# Patient Record
Sex: Female | Born: 1935 | Race: White | Hispanic: No | Smoking: Former smoker
Health system: Southern US, Community
[De-identification: ages and names within clinical notes are randomized; demographics above are authoritative.]

## PROBLEM LIST (undated history)

## (undated) DIAGNOSIS — E119 Type 2 diabetes mellitus without complications: Secondary | ICD-10-CM

## (undated) DIAGNOSIS — H269 Unspecified cataract: Secondary | ICD-10-CM

## (undated) DIAGNOSIS — I639 Cerebral infarction, unspecified: Secondary | ICD-10-CM

## (undated) DIAGNOSIS — D649 Anemia, unspecified: Secondary | ICD-10-CM

## (undated) DIAGNOSIS — R609 Edema, unspecified: Secondary | ICD-10-CM

## (undated) DIAGNOSIS — E039 Hypothyroidism, unspecified: Secondary | ICD-10-CM

## (undated) DIAGNOSIS — M869 Osteomyelitis, unspecified: Secondary | ICD-10-CM

## (undated) DIAGNOSIS — N189 Chronic kidney disease, unspecified: Secondary | ICD-10-CM

## (undated) DIAGNOSIS — I10 Essential (primary) hypertension: Secondary | ICD-10-CM

## (undated) DIAGNOSIS — F0390 Unspecified dementia without behavioral disturbance: Secondary | ICD-10-CM

## (undated) DIAGNOSIS — I509 Heart failure, unspecified: Secondary | ICD-10-CM

## (undated) DIAGNOSIS — I4891 Unspecified atrial fibrillation: Secondary | ICD-10-CM

## (undated) DIAGNOSIS — S72009A Fracture of unspecified part of neck of unspecified femur, initial encounter for closed fracture: Secondary | ICD-10-CM

## (undated) DIAGNOSIS — I469 Cardiac arrest, cause unspecified: Secondary | ICD-10-CM

## (undated) DIAGNOSIS — R627 Adult failure to thrive: Secondary | ICD-10-CM

## (undated) DIAGNOSIS — I739 Peripheral vascular disease, unspecified: Secondary | ICD-10-CM

## (undated) DIAGNOSIS — E785 Hyperlipidemia, unspecified: Secondary | ICD-10-CM

## (undated) DIAGNOSIS — G629 Polyneuropathy, unspecified: Secondary | ICD-10-CM

## (undated) HISTORY — DX: Type 2 diabetes mellitus without complications: E11.9

## (undated) HISTORY — DX: Unspecified cataract: H26.9

## (undated) HISTORY — PX: HEMIARTHROPLASTY HIP: SUR652

## (undated) HISTORY — PX: APPENDECTOMY: SHX54

## (undated) HISTORY — DX: Unspecified atrial fibrillation: I48.91

## (undated) HISTORY — PX: CHOLECYSTECTOMY: SHX55

## (undated) HISTORY — PX: CORONARY ANGIOPLASTY: SHX604

## (undated) HISTORY — DX: Hyperlipidemia, unspecified: E78.5

## (undated) HISTORY — DX: Hypothyroidism, unspecified: E03.9

## (undated) HISTORY — DX: Heart failure, unspecified: I50.9

---

## 2003-03-16 ENCOUNTER — Other Ambulatory Visit: Payer: Self-pay

## 2004-06-13 ENCOUNTER — Ambulatory Visit: Payer: Self-pay | Admitting: Internal Medicine

## 2004-10-14 ENCOUNTER — Inpatient Hospital Stay: Payer: Self-pay | Admitting: Internal Medicine

## 2004-10-14 ENCOUNTER — Other Ambulatory Visit: Payer: Self-pay

## 2005-06-26 ENCOUNTER — Ambulatory Visit: Payer: Self-pay | Admitting: Internal Medicine

## 2005-09-30 ENCOUNTER — Ambulatory Visit: Payer: Self-pay | Admitting: Gastroenterology

## 2006-01-07 ENCOUNTER — Ambulatory Visit: Payer: Self-pay | Admitting: Internal Medicine

## 2006-06-30 ENCOUNTER — Ambulatory Visit: Payer: Self-pay | Admitting: Internal Medicine

## 2007-07-20 ENCOUNTER — Ambulatory Visit: Payer: Self-pay | Admitting: Internal Medicine

## 2008-09-26 ENCOUNTER — Ambulatory Visit: Payer: Self-pay | Admitting: Internal Medicine

## 2009-10-30 ENCOUNTER — Ambulatory Visit: Payer: Self-pay | Admitting: Internal Medicine

## 2010-11-05 ENCOUNTER — Ambulatory Visit: Payer: Self-pay | Admitting: Internal Medicine

## 2010-11-19 ENCOUNTER — Ambulatory Visit: Payer: Self-pay | Admitting: Internal Medicine

## 2010-11-20 ENCOUNTER — Ambulatory Visit: Payer: Self-pay | Admitting: Internal Medicine

## 2011-06-13 ENCOUNTER — Ambulatory Visit: Payer: Self-pay | Admitting: Unknown Physician Specialty

## 2011-11-26 ENCOUNTER — Ambulatory Visit: Payer: Self-pay | Admitting: Internal Medicine

## 2012-02-14 ENCOUNTER — Inpatient Hospital Stay: Payer: Self-pay | Admitting: Orthopedic Surgery

## 2012-02-14 ENCOUNTER — Ambulatory Visit: Payer: Self-pay | Admitting: Orthopedic Surgery

## 2012-02-14 LAB — T4, FREE: Free Thyroxine: 0.55 ng/dL — ABNORMAL LOW (ref 0.76–1.46)

## 2012-02-14 LAB — BASIC METABOLIC PANEL
Anion Gap: 8 (ref 7–16)
Anion Gap: 10 (ref 7–16)
BUN: 19 mg/dL — ABNORMAL HIGH (ref 7–18)
Calcium, Total: 8.2 mg/dL — ABNORMAL LOW (ref 8.5–10.1)
Chloride: 96 mmol/L — ABNORMAL LOW (ref 98–107)
Chloride: 99 mmol/L (ref 98–107)
Co2: 22 mmol/L (ref 21–32)
Creatinine: 1.36 mg/dL — ABNORMAL HIGH (ref 0.60–1.30)
Creatinine: 1.05 mg/dL (ref 0.60–1.30)
EGFR (African American): 44 — ABNORMAL LOW
EGFR (African American): 60 — ABNORMAL LOW
EGFR (Non-African Amer.): 52 — ABNORMAL LOW
Glucose: 157 mg/dL — ABNORMAL HIGH (ref 65–99)
Glucose: 126 mg/dL — ABNORMAL HIGH (ref 65–99)
Osmolality: 265 (ref 275–301)
Potassium: 3.7 mmol/L (ref 3.5–5.1)
Sodium: 128 mmol/L — ABNORMAL LOW (ref 136–145)
Sodium: 131 mmol/L — ABNORMAL LOW (ref 136–145)

## 2012-02-14 LAB — CBC
MCH: 28.2 pg (ref 26.0–34.0)
MCHC: 33.1 g/dL (ref 32.0–36.0)
MCV: 85 fL (ref 80–100)
RBC: 4.39 10*6/uL (ref 3.80–5.20)
WBC: 14.3 10*3/uL — ABNORMAL HIGH (ref 3.6–11.0)

## 2012-02-14 LAB — URINALYSIS, COMPLETE
Bacteria: NONE SEEN
Bilirubin,UR: NEGATIVE
Glucose,UR: NEGATIVE mg/dL (ref 0–75)
Nitrite: NEGATIVE
Ph: 5 (ref 4.5–8.0)
Squamous Epithelial: 1

## 2012-02-14 LAB — PROTIME-INR: INR: 0.9

## 2012-02-14 LAB — HEMOGLOBIN A1C: Hemoglobin A1C: 6.6 % — ABNORMAL HIGH (ref 4.2–6.3)

## 2012-02-16 LAB — URINALYSIS, COMPLETE
Bacteria: NONE SEEN
Bilirubin,UR: NEGATIVE
Glucose,UR: NEGATIVE mg/dL
Ketone: NEGATIVE
Leukocyte Esterase: NEGATIVE
Nitrite: NEGATIVE
Ph: 5
Protein: NEGATIVE
RBC,UR: 4 /HPF
Specific Gravity: 1.008
Squamous Epithelial: <1
WBC UR: 1 /HPF

## 2012-02-16 LAB — BASIC METABOLIC PANEL
Anion Gap: 9 (ref 7–16)
BUN: 9 mg/dL (ref 7–18)
Chloride: 104 mmol/L (ref 98–107)
Co2: 22 mmol/L (ref 21–32)
EGFR (African American): 43 — ABNORMAL LOW
EGFR (Non-African Amer.): 37 — ABNORMAL LOW
Glucose: 135 mg/dL — ABNORMAL HIGH (ref 65–99)
Potassium: 3.7 mmol/L (ref 3.5–5.1)

## 2012-02-16 LAB — CBC WITH DIFFERENTIAL/PLATELET
Basophil #: 0.1 10*3/uL (ref 0.0–0.1)
Basophil %: 0.6 %
Eosinophil %: 2.7 %
HCT: 26.7 % — ABNORMAL LOW (ref 35.0–47.0)
Lymphocyte #: 1.4 10*3/uL (ref 1.0–3.6)
Lymphocyte %: 14.5 %
MCHC: 33.6 g/dL (ref 32.0–36.0)
MCV: 86 fL (ref 80–100)
Monocyte #: 1 x10 3/mm — ABNORMAL HIGH (ref 0.2–0.9)
Monocyte %: 9.9 %
Neutrophil #: 7.2 10*3/uL — ABNORMAL HIGH (ref 1.4–6.5)
Platelet: 158 10*3/uL (ref 150–440)
RBC: 3.12 10*6/uL — ABNORMAL LOW (ref 3.80–5.20)
RDW: 13.4 % (ref 11.5–14.5)
WBC: 10 10*3/uL (ref 3.6–11.0)

## 2012-02-17 LAB — BASIC METABOLIC PANEL
Anion Gap: 8 (ref 7–16)
BUN: 9 mg/dL (ref 7–18)
Chloride: 103 mmol/L (ref 98–107)
EGFR (African American): 47 — ABNORMAL LOW
Glucose: 141 mg/dL — ABNORMAL HIGH (ref 65–99)
Osmolality: 269 (ref 275–301)
Potassium: 3.6 mmol/L (ref 3.5–5.1)

## 2012-02-17 LAB — CBC WITH DIFFERENTIAL/PLATELET
Basophil #: 0 10*3/uL (ref 0.0–0.1)
Basophil %: 0.4 %
Eosinophil #: 0.1 10*3/uL (ref 0.0–0.7)
Eosinophil %: 1.3 %
MCV: 86 fL (ref 80–100)
Monocyte #: 1.1 x10 3/mm — ABNORMAL HIGH (ref 0.2–0.9)
Neutrophil #: 7.8 10*3/uL — ABNORMAL HIGH (ref 1.4–6.5)
RDW: 13.5 % (ref 11.5–14.5)
WBC: 10.8 10*3/uL (ref 3.6–11.0)

## 2012-02-18 ENCOUNTER — Encounter: Payer: Self-pay | Admitting: Internal Medicine

## 2012-02-18 LAB — PATHOLOGY REPORT

## 2012-02-23 ENCOUNTER — Ambulatory Visit: Payer: Self-pay | Admitting: Internal Medicine

## 2012-03-04 LAB — BASIC METABOLIC PANEL
Anion Gap: 6 — ABNORMAL LOW (ref 7–16)
BUN: 16 mg/dL (ref 7–18)
Calcium, Total: 9 mg/dL (ref 8.5–10.1)
Co2: 30 mmol/L (ref 21–32)
EGFR (African American): >60
EGFR (Non-African Amer.): 55 — ABNORMAL LOW
Glucose: 70 mg/dL (ref 65–99)
Osmolality: 270 (ref 275–301)
Potassium: 4 mmol/L (ref 3.5–5.1)

## 2012-05-05 ENCOUNTER — Ambulatory Visit: Payer: Self-pay | Admitting: Vascular Surgery

## 2012-05-05 LAB — BASIC METABOLIC PANEL
Anion Gap: 4 — ABNORMAL LOW (ref 7–16)
BUN: 15 mg/dL (ref 7–18)
Chloride: 99 mmol/L (ref 98–107)
Creatinine: 1.26 mg/dL (ref 0.60–1.30)
EGFR (Non-African Amer.): 41 — ABNORMAL LOW
Osmolality: 268 (ref 275–301)
Sodium: 132 mmol/L — ABNORMAL LOW (ref 136–145)

## 2012-05-26 ENCOUNTER — Inpatient Hospital Stay: Payer: Self-pay | Admitting: General Practice

## 2012-05-26 LAB — COMPREHENSIVE METABOLIC PANEL
Albumin: 3.6 g/dL (ref 3.4–5.0)
Alkaline Phosphatase: 93 U/L (ref 50–136)
Anion Gap: 9 (ref 7–16)
BUN: 13 mg/dL (ref 7–18)
Co2: 21 mmol/L (ref 21–32)
Creatinine: 1.02 mg/dL (ref 0.60–1.30)
Glucose: 122 mg/dL — ABNORMAL HIGH (ref 65–99)
Osmolality: 262 (ref 275–301)
SGOT(AST): 58 U/L — ABNORMAL HIGH (ref 15–37)
Sodium: 130 mmol/L — ABNORMAL LOW (ref 136–145)
Total Protein: 7.2 g/dL (ref 6.4–8.2)

## 2012-05-26 LAB — URINALYSIS, COMPLETE
Bacteria: NONE SEEN
Blood: NEGATIVE
Glucose,UR: NEGATIVE mg/dL (ref 0–75)
Hyaline Cast: 1
Ketone: NEGATIVE
Leukocyte Esterase: NEGATIVE
Nitrite: NEGATIVE
Protein: 30
RBC,UR: 1 /HPF (ref 0–5)
Specific Gravity: 1.018 (ref 1.003–1.030)
WBC UR: 1 /HPF (ref 0–5)

## 2012-05-26 LAB — CBC
HCT: 34.5 % — ABNORMAL LOW (ref 35.0–47.0)
MCH: 26.6 pg (ref 26.0–34.0)
MCV: 81 fL (ref 80–100)
Platelet: 208 10*3/uL (ref 150–440)
RBC: 4.24 10*6/uL (ref 3.80–5.20)
WBC: 14.2 10*3/uL — ABNORMAL HIGH (ref 3.6–11.0)

## 2012-05-26 LAB — TROPONIN I: Troponin-I: <0.02 ng/mL

## 2012-05-26 LAB — TSH: Thyroid Stimulating Horm: >100 u[IU]/mL

## 2012-05-27 LAB — BASIC METABOLIC PANEL
Anion Gap: 5 — ABNORMAL LOW (ref 7–16)
Calcium, Total: 8.1 mg/dL — ABNORMAL LOW (ref 8.5–10.1)
Chloride: 103 mmol/L (ref 98–107)
Co2: 26 mmol/L (ref 21–32)
Creatinine: 0.96 mg/dL (ref 0.60–1.30)
EGFR (African American): >60
Glucose: 107 mg/dL — ABNORMAL HIGH (ref 65–99)
Osmolality: 268 (ref 275–301)
Potassium: 3.8 mmol/L (ref 3.5–5.1)
Sodium: 134 mmol/L — ABNORMAL LOW (ref 136–145)

## 2012-05-27 LAB — CBC WITH DIFFERENTIAL/PLATELET
Basophil #: 0.1 10*3/uL (ref 0.0–0.1)
Basophil %: 1.1 %
Eosinophil #: 0.4 10*3/uL (ref 0.0–0.7)
Eosinophil %: 4.6 %
HCT: 31.3 % — ABNORMAL LOW (ref 35.0–47.0)
HGB: 10.3 g/dL — ABNORMAL LOW (ref 12.0–16.0)
Lymphocyte #: 1.7 10*3/uL (ref 1.0–3.6)
Lymphocyte %: 20.7 %
MCH: 27 pg (ref 26.0–34.0)
MCHC: 33 g/dL (ref 32.0–36.0)
Monocyte #: 0.6 x10 3/mm (ref 0.2–0.9)
Monocyte %: 7.5 %
Platelet: 220 10*3/uL (ref 150–440)
RBC: 3.83 10*6/uL (ref 3.80–5.20)
WBC: 8.1 10*3/uL (ref 3.6–11.0)

## 2012-05-28 LAB — COMPREHENSIVE METABOLIC PANEL
Anion Gap: 6 — ABNORMAL LOW (ref 7–16)
Bilirubin,Total: 0.2 mg/dL (ref 0.2–1.0)
Calcium, Total: 7 mg/dL — CL (ref 8.5–10.1)
Creatinine: 0.97 mg/dL (ref 0.60–1.30)
Potassium: 3.8 mmol/L (ref 3.5–5.1)
Sodium: 132 mmol/L — ABNORMAL LOW (ref 136–145)
Total Protein: 5.2 g/dL — ABNORMAL LOW (ref 6.4–8.2)

## 2012-05-28 LAB — CBC WITH DIFFERENTIAL/PLATELET
Basophil %: 0.9 %
Eosinophil #: 0.4 10*3/uL (ref 0.0–0.7)
Eosinophil %: 4.6 %
Lymphocyte #: 1.5 10*3/uL (ref 1.0–3.6)
Lymphocyte %: 15.7 %
MCHC: 33.6 g/dL (ref 32.0–36.0)
MCV: 82 fL (ref 80–100)
Monocyte #: 0.7 x10 3/mm (ref 0.2–0.9)
Monocyte %: 7.1 %
Platelet: 195 10*3/uL (ref 150–440)
RDW: 15.9 % — ABNORMAL HIGH (ref 11.5–14.5)

## 2012-05-28 LAB — TROPONIN I: Troponin-I: <0.02 ng/mL

## 2012-05-29 LAB — BASIC METABOLIC PANEL
Calcium, Total: 7.9 mg/dL — ABNORMAL LOW (ref 8.5–10.1)
Creatinine: 1.08 mg/dL (ref 0.60–1.30)
EGFR (African American): 58 — ABNORMAL LOW
EGFR (Non-African Amer.): 50 — ABNORMAL LOW
Osmolality: 267 (ref 275–301)
Sodium: 133 mmol/L — ABNORMAL LOW (ref 136–145)

## 2012-05-29 LAB — CBC WITH DIFFERENTIAL/PLATELET
Basophil #: 0.1 10*3/uL (ref 0.0–0.1)
Eosinophil %: 3.3 %
HGB: 8.6 g/dL — ABNORMAL LOW (ref 12.0–16.0)
Lymphocyte #: 2.2 10*3/uL (ref 1.0–3.6)
MCH: 28 pg (ref 26.0–34.0)
MCV: 82 fL (ref 80–100)
Monocyte #: 0.9 x10 3/mm (ref 0.2–0.9)
Monocyte %: 7.5 %
Neutrophil #: 9.1 10*3/uL — ABNORMAL HIGH (ref 1.4–6.5)
Neutrophil %: 71.3 %
RBC: 3.06 10*6/uL — ABNORMAL LOW (ref 3.80–5.20)
RDW: 15.7 % — ABNORMAL HIGH (ref 11.5–14.5)
WBC: 12.7 10*3/uL — ABNORMAL HIGH (ref 3.6–11.0)

## 2012-05-31 ENCOUNTER — Encounter: Payer: Self-pay | Admitting: Internal Medicine

## 2012-05-31 LAB — BASIC METABOLIC PANEL
Anion Gap: 6 — ABNORMAL LOW (ref 7–16)
BUN: 11 mg/dL (ref 7–18)
Calcium, Total: 8.4 mg/dL — ABNORMAL LOW (ref 8.5–10.1)
Chloride: 98 mmol/L (ref 98–107)
Co2: 27 mmol/L (ref 21–32)
Creatinine: 0.97 mg/dL (ref 0.60–1.30)
EGFR (Non-African Amer.): 57 — ABNORMAL LOW
Osmolality: 262 (ref 275–301)
Sodium: 131 mmol/L — ABNORMAL LOW (ref 136–145)

## 2012-05-31 LAB — PATHOLOGY REPORT

## 2012-06-13 ENCOUNTER — Encounter: Payer: Self-pay | Admitting: Internal Medicine

## 2012-06-22 LAB — BASIC METABOLIC PANEL
Anion Gap: 6 — ABNORMAL LOW (ref 7–16)
Creatinine: 1.13 mg/dL (ref 0.60–1.30)
EGFR (African American): 55 — ABNORMAL LOW
Glucose: 47 mg/dL — ABNORMAL LOW (ref 65–99)
Osmolality: 268 (ref 275–301)

## 2012-10-06 ENCOUNTER — Encounter: Payer: Self-pay | Admitting: Internal Medicine

## 2012-10-13 ENCOUNTER — Encounter: Payer: Self-pay | Admitting: Internal Medicine

## 2012-11-13 ENCOUNTER — Encounter: Payer: Self-pay | Admitting: Internal Medicine

## 2013-08-08 ENCOUNTER — Inpatient Hospital Stay: Payer: Self-pay | Admitting: Internal Medicine

## 2013-08-08 LAB — CBC
HCT: 40.5 % (ref 35.0–47.0)
HGB: 12.5 g/dL (ref 12.0–16.0)
MCH: 25.2 pg — ABNORMAL LOW (ref 26.0–34.0)
MCHC: 30.9 g/dL — ABNORMAL LOW (ref 32.0–36.0)
MCV: 82 fL (ref 80–100)
Platelet: 331 10*3/uL (ref 150–440)
RBC: 4.97 10*6/uL (ref 3.80–5.20)
RDW: 16.2 % — AB (ref 11.5–14.5)
WBC: 11.2 10*3/uL — ABNORMAL HIGH (ref 3.6–11.0)

## 2013-08-08 LAB — COMPREHENSIVE METABOLIC PANEL
ALK PHOS: 101 U/L
ALT: 17 U/L
ANION GAP: 9 (ref 7–16)
AST: 31 U/L (ref 15–37)
Albumin: 3.6 g/dL (ref 3.4–5.0)
BUN: 9 mg/dL (ref 7–18)
Bilirubin,Total: 0.4 mg/dL (ref 0.2–1.0)
CALCIUM: 8.7 mg/dL (ref 8.5–10.1)
CHLORIDE: 98 mmol/L (ref 98–107)
CREATININE: 1.1 mg/dL (ref 0.60–1.30)
Co2: 23 mmol/L (ref 21–32)
EGFR (Non-African Amer.): 48 — ABNORMAL LOW
GFR CALC AF AMER: 56 — AB
Glucose: 268 mg/dL — ABNORMAL HIGH (ref 65–99)
Osmolality: 269 (ref 275–301)
Potassium: 3.6 mmol/L (ref 3.5–5.1)
Sodium: 130 mmol/L — ABNORMAL LOW (ref 136–145)
Total Protein: 7.9 g/dL (ref 6.4–8.2)

## 2013-08-08 LAB — PRO B NATRIURETIC PEPTIDE: B-Type Natriuretic Peptide: 1266 pg/mL — ABNORMAL HIGH (ref 0–450)

## 2013-08-08 LAB — TROPONIN I: Troponin-I: <0.02 ng/mL

## 2013-08-09 LAB — CK TOTAL AND CKMB (NOT AT ARMC)
CK, TOTAL: 58 U/L
CK, Total: 55 U/L
CK, Total: 106 U/L
CK-MB: 2.1 ng/mL (ref 0.5–3.6)
CK-MB: 2.4 ng/mL (ref 0.5–3.6)
CK-MB: 2.3 ng/mL (ref 0.5–3.6)

## 2013-08-09 LAB — CBC WITH DIFFERENTIAL/PLATELET
BASOS ABS: 0.1 10*3/uL (ref 0.0–0.1)
Basophil %: 1.1 %
EOS PCT: 0.4 %
Eosinophil #: 0.1 10*3/uL (ref 0.0–0.7)
HCT: 33.7 % — ABNORMAL LOW (ref 35.0–47.0)
HGB: 10.6 g/dL — ABNORMAL LOW (ref 12.0–16.0)
Lymphocyte #: 2.5 10*3/uL (ref 1.0–3.6)
Lymphocyte %: 21.4 %
MCH: 25.2 pg — AB (ref 26.0–34.0)
MCHC: 31.4 g/dL — AB (ref 32.0–36.0)
MCV: 80 fL (ref 80–100)
MONO ABS: 0.7 x10 3/mm (ref 0.2–0.9)
Monocyte %: 6.2 %
NEUTROS ABS: 8.3 10*3/uL — AB (ref 1.4–6.5)
Neutrophil %: 70.9 %
PLATELETS: 334 10*3/uL (ref 150–440)
RBC: 4.2 10*6/uL (ref 3.80–5.20)
RDW: 15.5 % — ABNORMAL HIGH (ref 11.5–14.5)
WBC: 11.8 10*3/uL — ABNORMAL HIGH (ref 3.6–11.0)

## 2013-08-09 LAB — BASIC METABOLIC PANEL
Anion Gap: 13 (ref 7–16)
BUN: 10 mg/dL (ref 7–18)
CHLORIDE: 96 mmol/L — AB (ref 98–107)
CREATININE: 1.27 mg/dL (ref 0.60–1.30)
Calcium, Total: 8.6 mg/dL (ref 8.5–10.1)
Co2: 23 mmol/L (ref 21–32)
EGFR (African American): 47 — ABNORMAL LOW
EGFR (Non-African Amer.): 40 — ABNORMAL LOW
Glucose: 185 mg/dL — ABNORMAL HIGH (ref 65–99)
Osmolality: 268 (ref 275–301)
Potassium: 4 mmol/L (ref 3.5–5.1)
Sodium: 132 mmol/L — ABNORMAL LOW (ref 136–145)

## 2013-08-09 LAB — TROPONIN I
Troponin-I: 0.23 ng/mL — ABNORMAL HIGH
Troponin-I: 0.22 ng/mL — ABNORMAL HIGH

## 2013-08-09 LAB — MAGNESIUM: Magnesium: 1.6 mg/dL — ABNORMAL LOW

## 2013-08-09 LAB — TSH: Thyroid Stimulating Horm: 0.442 u[IU]/mL — ABNORMAL LOW

## 2013-08-10 LAB — BASIC METABOLIC PANEL
ANION GAP: 9 (ref 7–16)
BUN: 17 mg/dL (ref 7–18)
CO2: 27 mmol/L (ref 21–32)
Calcium, Total: 8.3 mg/dL — ABNORMAL LOW (ref 8.5–10.1)
Chloride: 98 mmol/L (ref 98–107)
Creatinine: 1.2 mg/dL (ref 0.60–1.30)
GFR CALC AF AMER: 50 — AB
GFR CALC NON AF AMER: 43 — AB
GLUCOSE: 155 mg/dL — AB (ref 65–99)
Osmolality: 273 (ref 275–301)
POTASSIUM: 3.7 mmol/L (ref 3.5–5.1)
SODIUM: 134 mmol/L — AB (ref 136–145)

## 2013-08-10 LAB — CBC WITH DIFFERENTIAL/PLATELET
Basophil #: 0 10*3/uL (ref 0.0–0.1)
Basophil %: 0.8 %
EOS ABS: 0.2 10*3/uL (ref 0.0–0.7)
EOS PCT: 3.2 %
HCT: 32.1 % — AB (ref 35.0–47.0)
HGB: 10.4 g/dL — AB (ref 12.0–16.0)
Lymphocyte #: 2.6 10*3/uL (ref 1.0–3.6)
Lymphocyte %: 41.5 %
MCH: 25.9 pg — ABNORMAL LOW (ref 26.0–34.0)
MCHC: 32.3 g/dL (ref 32.0–36.0)
MCV: 80 fL (ref 80–100)
MONO ABS: 0.5 x10 3/mm (ref 0.2–0.9)
Monocyte %: 7.8 %
Neutrophil #: 2.9 10*3/uL (ref 1.4–6.5)
Neutrophil %: 46.7 %
Platelet: 247 10*3/uL (ref 150–440)
RBC: 4.01 10*6/uL (ref 3.80–5.20)
RDW: 15.6 % — ABNORMAL HIGH (ref 11.5–14.5)
WBC: 6.2 10*3/uL (ref 3.6–11.0)

## 2013-08-10 LAB — MAGNESIUM: MAGNESIUM: 2 mg/dL

## 2013-08-11 LAB — BASIC METABOLIC PANEL
Anion Gap: 8 (ref 7–16)
BUN: 18 mg/dL (ref 7–18)
Calcium, Total: 8.6 mg/dL (ref 8.5–10.1)
Chloride: 95 mmol/L — ABNORMAL LOW (ref 98–107)
Co2: 27 mmol/L (ref 21–32)
Creatinine: 1.17 mg/dL (ref 0.60–1.30)
EGFR (African American): 52 — ABNORMAL LOW
EGFR (Non-African Amer.): 45 — ABNORMAL LOW
GLUCOSE: 159 mg/dL — AB (ref 65–99)
Osmolality: 266 (ref 275–301)
POTASSIUM: 3.5 mmol/L (ref 3.5–5.1)
SODIUM: 130 mmol/L — AB (ref 136–145)

## 2013-08-12 LAB — BASIC METABOLIC PANEL
Anion Gap: 7 (ref 7–16)
BUN: 19 mg/dL — AB (ref 7–18)
CALCIUM: 8.9 mg/dL (ref 8.5–10.1)
CREATININE: 1.16 mg/dL (ref 0.60–1.30)
Chloride: 97 mmol/L — ABNORMAL LOW (ref 98–107)
Co2: 28 mmol/L (ref 21–32)
EGFR (Non-African Amer.): 45 — ABNORMAL LOW
GFR CALC AF AMER: 52 — AB
GLUCOSE: 158 mg/dL — AB (ref 65–99)
Osmolality: 270 (ref 275–301)
POTASSIUM: 4.3 mmol/L (ref 3.5–5.1)
Sodium: 132 mmol/L — ABNORMAL LOW (ref 136–145)

## 2013-08-12 LAB — CBC WITH DIFFERENTIAL/PLATELET
Basophil #: 0.1 10*3/uL (ref 0.0–0.1)
Basophil %: 0.8 %
EOS ABS: 0.2 10*3/uL (ref 0.0–0.7)
Eosinophil %: 3 %
HCT: 33.9 % — ABNORMAL LOW (ref 35.0–47.0)
HGB: 11.2 g/dL — ABNORMAL LOW (ref 12.0–16.0)
Lymphocyte #: 2.2 10*3/uL (ref 1.0–3.6)
Lymphocyte %: 28.3 %
MCH: 26.1 pg (ref 26.0–34.0)
MCHC: 33.1 g/dL (ref 32.0–36.0)
MCV: 79 fL — AB (ref 80–100)
MONOS PCT: 7.7 %
Monocyte #: 0.6 x10 3/mm (ref 0.2–0.9)
NEUTROS ABS: 4.8 10*3/uL (ref 1.4–6.5)
Neutrophil %: 60.2 %
PLATELETS: 294 10*3/uL (ref 150–440)
RBC: 4.3 10*6/uL (ref 3.80–5.20)
RDW: 15.7 % — ABNORMAL HIGH (ref 11.5–14.5)
WBC: 7.9 10*3/uL (ref 3.6–11.0)

## 2013-08-13 ENCOUNTER — Ambulatory Visit: Payer: Self-pay | Admitting: Internal Medicine

## 2013-08-13 LAB — CULTURE, BLOOD (SINGLE)

## 2013-08-18 ENCOUNTER — Inpatient Hospital Stay: Payer: Self-pay | Admitting: Internal Medicine

## 2013-08-18 LAB — URINALYSIS, COMPLETE
Bilirubin,UR: NEGATIVE
Blood: NEGATIVE
Hyaline Cast: 2
Ketone: NEGATIVE
NITRITE: NEGATIVE
PH: 5 (ref 4.5–8.0)
Protein: 100
SPECIFIC GRAVITY: 1.012 (ref 1.003–1.030)
Squamous Epithelial: NONE SEEN

## 2013-08-18 LAB — TROPONIN I
TROPONIN-I: 0.06 ng/mL — AB
TROPONIN-I: 0.08 ng/mL — AB
Troponin-I: <0.02 ng/mL

## 2013-08-18 LAB — COMPREHENSIVE METABOLIC PANEL
ANION GAP: 18 — AB (ref 7–16)
Albumin: 3.3 g/dL — ABNORMAL LOW (ref 3.4–5.0)
Alkaline Phosphatase: 110 U/L
BUN: 16 mg/dL (ref 7–18)
Bilirubin,Total: 0.4 mg/dL (ref 0.2–1.0)
CO2: 16 mmol/L — AB (ref 21–32)
Calcium, Total: 8.1 mg/dL — ABNORMAL LOW (ref 8.5–10.1)
Chloride: 94 mmol/L — ABNORMAL LOW (ref 98–107)
Creatinine: 1.35 mg/dL — ABNORMAL HIGH (ref 0.60–1.30)
EGFR (African American): 43 — ABNORMAL LOW
GFR CALC NON AF AMER: 38 — AB
Glucose: 312 mg/dL — ABNORMAL HIGH (ref 65–99)
Osmolality: 270 (ref 275–301)
POTASSIUM: 4.9 mmol/L (ref 3.5–5.1)
SGOT(AST): 56 U/L — ABNORMAL HIGH (ref 15–37)
SGPT (ALT): 24 U/L
Sodium: 128 mmol/L — ABNORMAL LOW (ref 136–145)
Total Protein: 7.5 g/dL (ref 6.4–8.2)

## 2013-08-18 LAB — CK TOTAL AND CKMB (NOT AT ARMC)
CK, Total: 155 U/L
CK-MB: 2 ng/mL (ref 0.5–3.6)

## 2013-08-18 LAB — MAGNESIUM: MAGNESIUM: 1.6 mg/dL — AB

## 2013-08-18 LAB — CK
CK, TOTAL: 126 U/L
CK, Total: 121 U/L

## 2013-08-18 LAB — PRO B NATRIURETIC PEPTIDE: B-Type Natriuretic Peptide: 3507 pg/mL — ABNORMAL HIGH (ref 0–450)

## 2013-08-18 LAB — CBC
HCT: 42 % (ref 35.0–47.0)
HGB: 12.8 g/dL (ref 12.0–16.0)
MCH: 25.1 pg — AB (ref 26.0–34.0)
MCHC: 30.5 g/dL — ABNORMAL LOW (ref 32.0–36.0)
MCV: 82 fL (ref 80–100)
PLATELETS: 433 10*3/uL (ref 150–440)
RBC: 5.1 10*6/uL (ref 3.80–5.20)
RDW: 16.9 % — ABNORMAL HIGH (ref 11.5–14.5)
WBC: 24 10*3/uL — ABNORMAL HIGH (ref 3.6–11.0)

## 2013-08-18 LAB — PHOSPHORUS: Phosphorus: 3 mg/dL (ref 2.5–4.9)

## 2013-08-19 LAB — BASIC METABOLIC PANEL
Anion Gap: 8 (ref 7–16)
BUN: 12 mg/dL (ref 7–18)
Calcium, Total: 7.7 mg/dL — ABNORMAL LOW (ref 8.5–10.1)
Chloride: 95 mmol/L — ABNORMAL LOW (ref 98–107)
Co2: 29 mmol/L (ref 21–32)
Creatinine: 1.17 mg/dL (ref 0.60–1.30)
EGFR (African American): 52 — ABNORMAL LOW
EGFR (Non-African Amer.): 45 — ABNORMAL LOW
Glucose: 141 mg/dL — ABNORMAL HIGH (ref 65–99)
Osmolality: 267 (ref 275–301)
Potassium: 3.7 mmol/L (ref 3.5–5.1)
SODIUM: 132 mmol/L — AB (ref 136–145)

## 2013-08-19 LAB — CBC WITH DIFFERENTIAL/PLATELET
BASOS ABS: 0.1 10*3/uL (ref 0.0–0.1)
BASOS PCT: 0.5 %
EOS ABS: 0.1 10*3/uL (ref 0.0–0.7)
Eosinophil %: 0.7 %
HCT: 28.7 % — ABNORMAL LOW (ref 35.0–47.0)
HGB: 9.2 g/dL — ABNORMAL LOW (ref 12.0–16.0)
LYMPHS ABS: 2.2 10*3/uL (ref 1.0–3.6)
Lymphocyte %: 14.1 %
MCH: 25.3 pg — AB (ref 26.0–34.0)
MCHC: 31.9 g/dL — AB (ref 32.0–36.0)
MCV: 79 fL — ABNORMAL LOW (ref 80–100)
MONOS PCT: 4.7 %
Monocyte #: 0.7 x10 3/mm (ref 0.2–0.9)
Neutrophil #: 12.3 10*3/uL — ABNORMAL HIGH (ref 1.4–6.5)
Neutrophil %: 80 %
Platelet: 309 10*3/uL (ref 150–440)
RBC: 3.63 10*6/uL — AB (ref 3.80–5.20)
RDW: 16.2 % — AB (ref 11.5–14.5)
WBC: 15.4 10*3/uL — AB (ref 3.6–11.0)

## 2013-08-19 LAB — MAGNESIUM: Magnesium: 1.8 mg/dL

## 2013-08-19 LAB — PHOSPHORUS: PHOSPHORUS: 2.7 mg/dL (ref 2.5–4.9)

## 2013-08-20 LAB — CBC WITH DIFFERENTIAL/PLATELET
BASOS ABS: 0 10*3/uL (ref 0.0–0.1)
Basophil %: 0.3 %
EOS PCT: 1.1 %
Eosinophil #: 0.2 10*3/uL (ref 0.0–0.7)
HCT: 24.9 % — ABNORMAL LOW (ref 35.0–47.0)
HGB: 8.2 g/dL — AB (ref 12.0–16.0)
LYMPHS PCT: 13.2 %
Lymphocyte #: 1.9 10*3/uL (ref 1.0–3.6)
MCH: 25.5 pg — ABNORMAL LOW (ref 26.0–34.0)
MCHC: 32.9 g/dL (ref 32.0–36.0)
MCV: 77 fL — ABNORMAL LOW (ref 80–100)
MONO ABS: 0.7 x10 3/mm (ref 0.2–0.9)
Monocyte %: 4.7 %
Neutrophil #: 11.4 10*3/uL — ABNORMAL HIGH (ref 1.4–6.5)
Neutrophil %: 80.7 %
PLATELETS: 251 10*3/uL (ref 150–440)
RBC: 3.21 10*6/uL — ABNORMAL LOW (ref 3.80–5.20)
RDW: 16.2 % — ABNORMAL HIGH (ref 11.5–14.5)
WBC: 14.1 10*3/uL — ABNORMAL HIGH (ref 3.6–11.0)

## 2013-08-20 LAB — MAGNESIUM
Magnesium: 1.5 mg/dL — ABNORMAL LOW
Magnesium: 2.3 mg/dL

## 2013-08-20 LAB — COMPREHENSIVE METABOLIC PANEL
ALBUMIN: 2 g/dL — AB (ref 3.4–5.0)
ANION GAP: 6 — AB (ref 7–16)
Alkaline Phosphatase: 80 U/L
BILIRUBIN TOTAL: 0.6 mg/dL (ref 0.2–1.0)
BUN: 11 mg/dL (ref 7–18)
CHLORIDE: 92 mmol/L — AB (ref 98–107)
CO2: 32 mmol/L (ref 21–32)
CREATININE: 1.07 mg/dL (ref 0.60–1.30)
Calcium, Total: 7.5 mg/dL — ABNORMAL LOW (ref 8.5–10.1)
GFR CALC AF AMER: 58 — AB
GFR CALC NON AF AMER: 50 — AB
GLUCOSE: 247 mg/dL — AB (ref 65–99)
Osmolality: 268 (ref 275–301)
Potassium: 2.9 mmol/L — ABNORMAL LOW (ref 3.5–5.1)
SGOT(AST): 28 U/L (ref 15–37)
SGPT (ALT): 18 U/L
SODIUM: 130 mmol/L — AB (ref 136–145)
Total Protein: 5.4 g/dL — ABNORMAL LOW (ref 6.4–8.2)

## 2013-08-20 LAB — POTASSIUM
Potassium: 3.1 mmol/L — ABNORMAL LOW (ref 3.5–5.1)
Potassium: 3.7 mmol/L (ref 3.5–5.1)

## 2013-08-20 LAB — TROPONIN I: Troponin-I: <0.02 ng/mL

## 2013-08-21 LAB — BASIC METABOLIC PANEL
ANION GAP: 10 (ref 7–16)
BUN: 13 mg/dL (ref 7–18)
CALCIUM: 7.6 mg/dL — AB (ref 8.5–10.1)
Chloride: 99 mmol/L (ref 98–107)
Co2: 25 mmol/L (ref 21–32)
Creatinine: 1.03 mg/dL (ref 0.60–1.30)
EGFR (African American): >60
EGFR (Non-African Amer.): 52 — ABNORMAL LOW
GLUCOSE: 230 mg/dL — AB (ref 65–99)
Osmolality: 276 (ref 275–301)
POTASSIUM: 3.4 mmol/L — AB (ref 3.5–5.1)
Sodium: 134 mmol/L — ABNORMAL LOW (ref 136–145)

## 2013-08-21 LAB — CBC WITH DIFFERENTIAL/PLATELET
BASOS ABS: 0 10*3/uL (ref 0.0–0.1)
Basophil %: 0.2 %
EOS ABS: 0.2 10*3/uL (ref 0.0–0.7)
Eosinophil %: 1.7 %
HCT: 22.4 % — AB (ref 35.0–47.0)
HGB: 7.2 g/dL — ABNORMAL LOW (ref 12.0–16.0)
LYMPHS PCT: 8.4 %
Lymphocyte #: 0.9 10*3/uL — ABNORMAL LOW (ref 1.0–3.6)
MCH: 25.4 pg — AB (ref 26.0–34.0)
MCHC: 32.2 g/dL (ref 32.0–36.0)
MCV: 79 fL — AB (ref 80–100)
MONOS PCT: 6.2 %
Monocyte #: 0.7 x10 3/mm (ref 0.2–0.9)
NEUTROS ABS: 9.2 10*3/uL — AB (ref 1.4–6.5)
Neutrophil %: 83.5 %
PLATELETS: 211 10*3/uL (ref 150–440)
RBC: 2.84 10*6/uL — AB (ref 3.80–5.20)
RDW: 16 % — AB (ref 11.5–14.5)
WBC: 11 10*3/uL (ref 3.6–11.0)

## 2013-08-21 LAB — POTASSIUM
POTASSIUM: 3.5 mmol/L (ref 3.5–5.1)
Potassium: 3.9 mmol/L (ref 3.5–5.1)

## 2013-08-21 LAB — PHOSPHORUS
PHOSPHORUS: 2.5 mg/dL (ref 2.5–4.9)
Phosphorus: 1.1 mg/dL — ABNORMAL LOW (ref 2.5–4.9)

## 2013-08-21 LAB — MAGNESIUM: MAGNESIUM: 2 mg/dL

## 2013-08-21 LAB — URINE CULTURE

## 2013-08-22 LAB — PHOSPHORUS
PHOSPHORUS: 2.9 mg/dL (ref 2.5–4.9)
Phosphorus: 1.7 mg/dL — ABNORMAL LOW (ref 2.5–4.9)

## 2013-08-22 LAB — CALCIUM: CALCIUM: 7.9 mg/dL — AB (ref 8.5–10.1)

## 2013-08-22 LAB — MAGNESIUM
MAGNESIUM: 2.7 mg/dL — AB
Magnesium: 1.6 mg/dL — ABNORMAL LOW

## 2013-08-22 LAB — SODIUM: SODIUM: 134 mmol/L — AB (ref 136–145)

## 2013-08-22 LAB — POTASSIUM: POTASSIUM: 3.5 mmol/L (ref 3.5–5.1)

## 2013-08-23 LAB — CBC WITH DIFFERENTIAL/PLATELET
BASOS ABS: 0.1 10*3/uL (ref 0.0–0.1)
Basophil %: 0.8 %
EOS PCT: 2.1 %
Eosinophil #: 0.2 10*3/uL (ref 0.0–0.7)
HCT: 24.7 % — ABNORMAL LOW (ref 35.0–47.0)
HGB: 7.8 g/dL — ABNORMAL LOW (ref 12.0–16.0)
Lymphocyte #: 1.1 10*3/uL (ref 1.0–3.6)
Lymphocyte %: 11.8 %
MCH: 24.8 pg — ABNORMAL LOW (ref 26.0–34.0)
MCHC: 31.5 g/dL — AB (ref 32.0–36.0)
MCV: 79 fL — AB (ref 80–100)
Monocyte #: 1.3 x10 3/mm — ABNORMAL HIGH (ref 0.2–0.9)
Monocyte %: 13.5 %
NEUTROS PCT: 71.8 %
Neutrophil #: 7 10*3/uL — ABNORMAL HIGH (ref 1.4–6.5)
Platelet: 304 10*3/uL (ref 150–440)
RBC: 3.14 10*6/uL — ABNORMAL LOW (ref 3.80–5.20)
RDW: 16.4 % — ABNORMAL HIGH (ref 11.5–14.5)
WBC: 9.7 10*3/uL (ref 3.6–11.0)

## 2013-08-23 LAB — COMPREHENSIVE METABOLIC PANEL
ALBUMIN: 2 g/dL — AB (ref 3.4–5.0)
ALT: 11 U/L — AB
Alkaline Phosphatase: 87 U/L
Anion Gap: 13 (ref 7–16)
BILIRUBIN TOTAL: 0.3 mg/dL (ref 0.2–1.0)
BUN: 26 mg/dL — AB (ref 7–18)
Calcium, Total: 8.2 mg/dL — ABNORMAL LOW (ref 8.5–10.1)
Chloride: 96 mmol/L — ABNORMAL LOW (ref 98–107)
Co2: 26 mmol/L (ref 21–32)
Creatinine: 1.32 mg/dL — ABNORMAL HIGH (ref 0.60–1.30)
GFR CALC AF AMER: 45 — AB
GFR CALC NON AF AMER: 39 — AB
Glucose: 176 mg/dL — ABNORMAL HIGH (ref 65–99)
Osmolality: 279 (ref 275–301)
POTASSIUM: 3.3 mmol/L — AB (ref 3.5–5.1)
SGOT(AST): 19 U/L (ref 15–37)
SODIUM: 135 mmol/L — AB (ref 136–145)
Total Protein: 6.7 g/dL (ref 6.4–8.2)

## 2013-08-23 LAB — MAGNESIUM: MAGNESIUM: 2.4 mg/dL

## 2013-08-23 LAB — POTASSIUM: Potassium: 4.6 mmol/L (ref 3.5–5.1)

## 2013-08-23 LAB — CULTURE, BLOOD (SINGLE)

## 2013-08-23 LAB — PHOSPHORUS: PHOSPHORUS: 2.7 mg/dL (ref 2.5–4.9)

## 2013-08-24 LAB — BASIC METABOLIC PANEL
Anion Gap: 9 (ref 7–16)
BUN: 25 mg/dL — AB (ref 7–18)
CHLORIDE: 103 mmol/L (ref 98–107)
CO2: 28 mmol/L (ref 21–32)
Calcium, Total: 8.8 mg/dL (ref 8.5–10.1)
Creatinine: 1.1 mg/dL (ref 0.60–1.30)
EGFR (Non-African Amer.): 48 — ABNORMAL LOW
GFR CALC AF AMER: 56 — AB
GLUCOSE: 201 mg/dL — AB (ref 65–99)
OSMOLALITY: 289 (ref 275–301)
POTASSIUM: 4.2 mmol/L (ref 3.5–5.1)
Sodium: 140 mmol/L (ref 136–145)

## 2013-08-24 LAB — CBC WITH DIFFERENTIAL/PLATELET
BASOS PCT: 1.1 %
Basophil #: 0.1 10*3/uL (ref 0.0–0.1)
Eosinophil #: 0.3 10*3/uL (ref 0.0–0.7)
Eosinophil %: 3.2 %
HCT: 27.3 % — AB (ref 35.0–47.0)
HGB: 8.8 g/dL — ABNORMAL LOW (ref 12.0–16.0)
Lymphocyte #: 1.7 10*3/uL (ref 1.0–3.6)
Lymphocyte %: 16.7 %
MCH: 25.1 pg — ABNORMAL LOW (ref 26.0–34.0)
MCHC: 32.1 g/dL (ref 32.0–36.0)
MCV: 78 fL — AB (ref 80–100)
Monocyte #: 1.3 x10 3/mm — ABNORMAL HIGH (ref 0.2–0.9)
Monocyte %: 12.4 %
NEUTROS PCT: 66.6 %
Neutrophil #: 6.8 10*3/uL — ABNORMAL HIGH (ref 1.4–6.5)
Platelet: 364 10*3/uL (ref 150–440)
RBC: 3.5 10*6/uL — ABNORMAL LOW (ref 3.80–5.20)
RDW: 16.5 % — AB (ref 11.5–14.5)
WBC: 10.2 10*3/uL (ref 3.6–11.0)

## 2013-08-24 LAB — MAGNESIUM: Magnesium: 2.5 mg/dL — ABNORMAL HIGH

## 2013-08-24 LAB — PHOSPHORUS: PHOSPHORUS: 2.5 mg/dL (ref 2.5–4.9)

## 2013-08-24 LAB — EXPECTORATED SPUTUM ASSESSMENT W GRAM STAIN, RFLX TO RESP C

## 2013-08-25 HISTORY — PX: CARDIAC CATHETERIZATION: SHX172

## 2013-08-25 LAB — BASIC METABOLIC PANEL
ANION GAP: 10 (ref 7–16)
ANION GAP: 13 (ref 7–16)
BUN: 25 mg/dL — AB (ref 7–18)
BUN: 22 mg/dL — ABNORMAL HIGH (ref 7–18)
CALCIUM: 8.6 mg/dL (ref 8.5–10.1)
CALCIUM: 8.8 mg/dL (ref 8.5–10.1)
CO2: 24 mmol/L (ref 21–32)
CREATININE: 1.1 mg/dL (ref 0.60–1.30)
Chloride: 104 mmol/L (ref 98–107)
Chloride: 99 mmol/L (ref 98–107)
Co2: 24 mmol/L (ref 21–32)
Creatinine: 1.03 mg/dL (ref 0.60–1.30)
EGFR (Non-African Amer.): 48 — ABNORMAL LOW
GFR CALC AF AMER: 56 — AB
GFR CALC NON AF AMER: 52 — AB
GLUCOSE: 229 mg/dL — AB (ref 65–99)
Glucose: 265 mg/dL — ABNORMAL HIGH (ref 65–99)
OSMOLALITY: 287 (ref 275–301)
OSMOLALITY: 285 (ref 275–301)
Potassium: 4 mmol/L (ref 3.5–5.1)
Potassium: 3.7 mmol/L (ref 3.5–5.1)
SODIUM: 138 mmol/L (ref 136–145)
SODIUM: 136 mmol/L (ref 136–145)

## 2013-08-25 LAB — CBC WITH DIFFERENTIAL/PLATELET
Basophil: 2 %
Eosinophil: 3 %
HCT: 28.2 % — ABNORMAL LOW
HGB: 8.9 g/dL — ABNORMAL LOW
Lymphocytes: 21 %
MCH: 25.1 pg — ABNORMAL LOW
MCHC: 31.7 g/dL — ABNORMAL LOW
MCV: 79 fL — ABNORMAL LOW
Metamyelocyte: 1 %
Monocytes: 11 %
Myelocyte: 2 %
Platelet: 408 x10 3/mm 3
RBC: 3.57 X10 6/mm 3 — ABNORMAL LOW
RDW: 16.8 % — ABNORMAL HIGH
Segmented Neutrophils: 60 %
WBC: 12.5 x10 3/mm 3 — ABNORMAL HIGH

## 2013-08-26 ENCOUNTER — Encounter: Payer: Self-pay | Admitting: Internal Medicine

## 2013-08-26 LAB — CBC WITH DIFFERENTIAL/PLATELET
BASOS PCT: 1.3 %
Basophil #: 0.1 10*3/uL (ref 0.0–0.1)
Eosinophil #: 0.4 10*3/uL (ref 0.0–0.7)
Eosinophil %: 3.6 %
HCT: 32.9 % — AB (ref 35.0–47.0)
HGB: 10.1 g/dL — ABNORMAL LOW (ref 12.0–16.0)
Lymphocyte #: 1.8 10*3/uL (ref 1.0–3.6)
Lymphocyte %: 18.3 %
MCH: 24.4 pg — AB (ref 26.0–34.0)
MCHC: 30.6 g/dL — AB (ref 32.0–36.0)
MCV: 80 fL (ref 80–100)
MONO ABS: 1.1 x10 3/mm — AB (ref 0.2–0.9)
Monocyte %: 10.8 %
NEUTROS ABS: 6.6 10*3/uL — AB (ref 1.4–6.5)
NEUTROS PCT: 66 %
PLATELETS: 465 10*3/uL — AB (ref 150–440)
RBC: 4.13 10*6/uL (ref 3.80–5.20)
RDW: 16.9 % — ABNORMAL HIGH (ref 11.5–14.5)
WBC: 10 10*3/uL (ref 3.6–11.0)

## 2013-08-26 LAB — COMPREHENSIVE METABOLIC PANEL
AST: 19 U/L (ref 15–37)
Albumin: 2.4 g/dL — ABNORMAL LOW (ref 3.4–5.0)
Alkaline Phosphatase: 82 U/L
Anion Gap: 13 (ref 7–16)
BILIRUBIN TOTAL: 0.4 mg/dL (ref 0.2–1.0)
BUN: 32 mg/dL — ABNORMAL HIGH (ref 7–18)
Calcium, Total: 8.9 mg/dL (ref 8.5–10.1)
Chloride: 100 mmol/L (ref 98–107)
Co2: 25 mmol/L (ref 21–32)
Creatinine: 1.42 mg/dL — ABNORMAL HIGH (ref 0.60–1.30)
EGFR (Non-African Amer.): 35 — ABNORMAL LOW
GFR CALC AF AMER: 41 — AB
GLUCOSE: 237 mg/dL — AB (ref 65–99)
Osmolality: 290 (ref 275–301)
Potassium: 3.9 mmol/L (ref 3.5–5.1)
SGPT (ALT): 12 U/L — ABNORMAL LOW
SODIUM: 138 mmol/L (ref 136–145)
TOTAL PROTEIN: 7.2 g/dL (ref 6.4–8.2)

## 2013-08-27 LAB — BASIC METABOLIC PANEL
Anion Gap: 10 (ref 7–16)
BUN: 28 mg/dL — AB (ref 7–18)
CO2: 26 mmol/L (ref 21–32)
Calcium, Total: 8.2 mg/dL — ABNORMAL LOW (ref 8.5–10.1)
Chloride: 104 mmol/L (ref 98–107)
Creatinine: 1.21 mg/dL (ref 0.60–1.30)
EGFR (African American): 50 — ABNORMAL LOW
GFR CALC NON AF AMER: 43 — AB
Glucose: 173 mg/dL — ABNORMAL HIGH (ref 65–99)
Osmolality: 289 (ref 275–301)
POTASSIUM: 3.5 mmol/L (ref 3.5–5.1)
Sodium: 140 mmol/L (ref 136–145)

## 2013-08-28 LAB — BASIC METABOLIC PANEL
Anion Gap: 7 (ref 7–16)
BUN: 18 mg/dL (ref 7–18)
Calcium, Total: 8.2 mg/dL — ABNORMAL LOW (ref 8.5–10.1)
Chloride: 105 mmol/L (ref 98–107)
Co2: 25 mmol/L (ref 21–32)
Creatinine: 1.12 mg/dL (ref 0.60–1.30)
EGFR (African American): 54 — ABNORMAL LOW
GFR CALC NON AF AMER: 47 — AB
GLUCOSE: 160 mg/dL — AB (ref 65–99)
Osmolality: 279 (ref 275–301)
Potassium: 3.6 mmol/L (ref 3.5–5.1)
Sodium: 137 mmol/L (ref 136–145)

## 2013-08-28 LAB — DIGOXIN LEVEL: Digoxin: 1.7 ng/mL

## 2013-08-28 LAB — HEMATOCRIT: HCT: 29.4 % — ABNORMAL LOW (ref 35.0–47.0)

## 2013-09-01 LAB — TSH: THYROID STIMULATING HORM: 4.05 u[IU]/mL

## 2013-09-13 ENCOUNTER — Encounter: Payer: Self-pay | Admitting: Internal Medicine

## 2013-09-13 ENCOUNTER — Ambulatory Visit: Payer: Self-pay | Admitting: Internal Medicine

## 2013-09-15 LAB — BASIC METABOLIC PANEL
Anion Gap: 11 (ref 7–16)
BUN: 16 mg/dL (ref 7–18)
Calcium, Total: 8.8 mg/dL (ref 8.5–10.1)
Chloride: 101 mmol/L (ref 98–107)
Co2: 23 mmol/L (ref 21–32)
Creatinine: 1.44 mg/dL — ABNORMAL HIGH (ref 0.60–1.30)
GFR CALC AF AMER: 40 — AB
GFR CALC NON AF AMER: 35 — AB
GLUCOSE: 204 mg/dL — AB (ref 65–99)
Osmolality: 277 (ref 275–301)
Potassium: 4.1 mmol/L (ref 3.5–5.1)
Sodium: 135 mmol/L — ABNORMAL LOW (ref 136–145)

## 2013-10-12 ENCOUNTER — Ambulatory Visit: Payer: Self-pay | Admitting: Family

## 2013-10-13 ENCOUNTER — Encounter: Payer: Self-pay | Admitting: Internal Medicine

## 2013-11-27 ENCOUNTER — Inpatient Hospital Stay: Payer: Self-pay | Admitting: Internal Medicine

## 2013-11-27 LAB — BASIC METABOLIC PANEL
Anion Gap: 9 (ref 7–16)
BUN: 17 mg/dL (ref 7–18)
CALCIUM: 7.7 mg/dL — AB (ref 8.5–10.1)
Chloride: 106 mmol/L (ref 98–107)
Co2: 21 mmol/L (ref 21–32)
Creatinine: 1.29 mg/dL (ref 0.60–1.30)
GFR CALC AF AMER: 51 — AB
GFR CALC NON AF AMER: 42 — AB
GLUCOSE: 176 mg/dL — AB (ref 65–99)
Osmolality: 278 (ref 275–301)
POTASSIUM: 4.1 mmol/L (ref 3.5–5.1)
Sodium: 136 mmol/L (ref 136–145)

## 2013-11-27 LAB — CBC
HCT: 38.3 % (ref 35.0–47.0)
HGB: 12 g/dL (ref 12.0–16.0)
MCH: 25.3 pg — AB (ref 26.0–34.0)
MCHC: 31.3 g/dL — ABNORMAL LOW (ref 32.0–36.0)
MCV: 81 fL (ref 80–100)
Platelet: 255 10*3/uL (ref 150–440)
RBC: 4.74 10*6/uL (ref 3.80–5.20)
RDW: 17.8 % — AB (ref 11.5–14.5)
WBC: 12.5 10*3/uL — ABNORMAL HIGH (ref 3.6–11.0)

## 2013-11-27 LAB — TROPONIN I: Troponin-I: 0.07 ng/mL — ABNORMAL HIGH

## 2013-11-28 LAB — TROPONIN I
Troponin-I: 0.44 ng/mL — ABNORMAL HIGH
Troponin-I: 0.37 ng/mL — ABNORMAL HIGH

## 2013-11-28 LAB — CK TOTAL AND CKMB (NOT AT ARMC)
CK, Total: 117 U/L
CK, Total: 114 U/L
CK-MB: 6.8 ng/mL — ABNORMAL HIGH (ref 0.5–3.6)
CK-MB: 6.5 ng/mL — ABNORMAL HIGH (ref 0.5–3.6)

## 2013-11-28 LAB — TSH: Thyroid Stimulating Horm: 0.194 u[IU]/mL — ABNORMAL LOW

## 2013-11-30 LAB — COMPREHENSIVE METABOLIC PANEL
ALBUMIN: 3.4 g/dL (ref 3.4–5.0)
AST: 52 U/L — AB (ref 15–37)
Alkaline Phosphatase: 115 U/L
Anion Gap: 6 — ABNORMAL LOW (ref 7–16)
BUN: 18 mg/dL (ref 7–18)
Bilirubin,Total: 0.6 mg/dL (ref 0.2–1.0)
Calcium, Total: 8.8 mg/dL (ref 8.5–10.1)
Chloride: 99 mmol/L (ref 98–107)
Co2: 27 mmol/L (ref 21–32)
Creatinine: 1.3 mg/dL (ref 0.60–1.30)
GLUCOSE: 178 mg/dL — AB (ref 65–99)
Osmolality: 271 (ref 275–301)
POTASSIUM: 4.1 mmol/L (ref 3.5–5.1)
SGPT (ALT): 52 U/L
Sodium: 132 mmol/L — ABNORMAL LOW (ref 136–145)
Total Protein: 6.8 g/dL (ref 6.4–8.2)

## 2013-11-30 LAB — MAGNESIUM: Magnesium: 1.7 mg/dL — ABNORMAL LOW

## 2013-12-11 ENCOUNTER — Inpatient Hospital Stay: Payer: Self-pay | Admitting: Internal Medicine

## 2013-12-11 LAB — COMPREHENSIVE METABOLIC PANEL
ALBUMIN: 3.1 g/dL — AB (ref 3.4–5.0)
ALT: 28 U/L
Alkaline Phosphatase: 141 U/L — ABNORMAL HIGH
Anion Gap: 10 (ref 7–16)
BUN: 33 mg/dL — ABNORMAL HIGH (ref 7–18)
Bilirubin,Total: 0.3 mg/dL (ref 0.2–1.0)
Calcium, Total: 8.1 mg/dL — ABNORMAL LOW (ref 8.5–10.1)
Chloride: 100 mmol/L (ref 98–107)
Co2: 21 mmol/L (ref 21–32)
Creatinine: 2.02 mg/dL — ABNORMAL HIGH (ref 0.60–1.30)
GFR CALC AF AMER: 31 — AB
GFR CALC NON AF AMER: 25 — AB
Glucose: 238 mg/dL — ABNORMAL HIGH (ref 65–99)
OSMOLALITY: 278 (ref 275–301)
Potassium: 5.3 mmol/L — ABNORMAL HIGH (ref 3.5–5.1)
SGOT(AST): 36 U/L (ref 15–37)
Sodium: 131 mmol/L — ABNORMAL LOW (ref 136–145)
Total Protein: 6.8 g/dL (ref 6.4–8.2)

## 2013-12-11 LAB — CBC
HCT: 35.6 % (ref 35.0–47.0)
HGB: 11.1 g/dL — AB (ref 12.0–16.0)
MCH: 25.3 pg — ABNORMAL LOW (ref 26.0–34.0)
MCHC: 31.1 g/dL — ABNORMAL LOW (ref 32.0–36.0)
MCV: 81 fL (ref 80–100)
Platelet: 318 10*3/uL (ref 150–440)
RBC: 4.38 10*6/uL (ref 3.80–5.20)
RDW: 16.5 % — ABNORMAL HIGH (ref 11.5–14.5)
WBC: 16.1 10*3/uL — ABNORMAL HIGH (ref 3.6–11.0)

## 2013-12-11 LAB — TROPONIN I: Troponin-I: <0.02 ng/mL

## 2013-12-11 LAB — CK TOTAL AND CKMB (NOT AT ARMC)
CK, Total: 61 U/L (ref 26–192)
CK-MB: 2.6 ng/mL (ref 0.5–3.6)

## 2013-12-12 LAB — CBC WITH DIFFERENTIAL/PLATELET
BASOS ABS: 0.1 10*3/uL (ref 0.0–0.1)
Basophil %: 0.8 %
EOS ABS: 0.2 10*3/uL (ref 0.0–0.7)
Eosinophil %: 1.6 %
HCT: 34.6 % — ABNORMAL LOW (ref 35.0–47.0)
HGB: 11 g/dL — ABNORMAL LOW (ref 12.0–16.0)
Lymphocyte #: 2.1 10*3/uL (ref 1.0–3.6)
Lymphocyte %: 22.4 %
MCH: 25.4 pg — ABNORMAL LOW (ref 26.0–34.0)
MCHC: 31.8 g/dL — ABNORMAL LOW (ref 32.0–36.0)
MCV: 80 fL (ref 80–100)
Monocyte #: 0.8 x10 3/mm (ref 0.2–0.9)
Monocyte %: 8.6 %
NEUTROS ABS: 6.2 10*3/uL (ref 1.4–6.5)
Neutrophil %: 66.6 %
Platelet: 300 10*3/uL (ref 150–440)
RBC: 4.33 10*6/uL (ref 3.80–5.20)
RDW: 16.4 % — ABNORMAL HIGH (ref 11.5–14.5)
WBC: 9.4 10*3/uL (ref 3.6–11.0)

## 2013-12-12 LAB — BASIC METABOLIC PANEL
Anion Gap: 10 (ref 7–16)
BUN: 29 mg/dL — ABNORMAL HIGH (ref 7–18)
CALCIUM: 8.3 mg/dL — AB (ref 8.5–10.1)
CHLORIDE: 101 mmol/L (ref 98–107)
CREATININE: 1.62 mg/dL — AB (ref 0.60–1.30)
Co2: 24 mmol/L (ref 21–32)
EGFR (Non-African Amer.): 33 — ABNORMAL LOW
GFR CALC AF AMER: 40 — AB
Glucose: 151 mg/dL — ABNORMAL HIGH (ref 65–99)
Osmolality: 279 (ref 275–301)
Potassium: 4.2 mmol/L (ref 3.5–5.1)
Sodium: 135 mmol/L — ABNORMAL LOW (ref 136–145)

## 2013-12-12 LAB — CK-MB
CK-MB: 2.4 ng/mL (ref 0.5–3.6)
CK-MB: 2.1 ng/mL (ref 0.5–3.6)

## 2013-12-12 LAB — TSH: Thyroid Stimulating Horm: 0.44 u[IU]/mL — ABNORMAL LOW

## 2013-12-12 LAB — TROPONIN I
Troponin-I: <0.02 ng/mL
Troponin-I: <0.02 ng/mL

## 2013-12-13 LAB — BASIC METABOLIC PANEL
ANION GAP: 8 (ref 7–16)
BUN: 43 mg/dL — ABNORMAL HIGH (ref 7–18)
CALCIUM: 8 mg/dL — AB (ref 8.5–10.1)
Chloride: 102 mmol/L (ref 98–107)
Co2: 25 mmol/L (ref 21–32)
Creatinine: 1.72 mg/dL — ABNORMAL HIGH (ref 0.60–1.30)
EGFR (African American): 37 — ABNORMAL LOW
EGFR (Non-African Amer.): 30 — ABNORMAL LOW
Glucose: 164 mg/dL — ABNORMAL HIGH (ref 65–99)
Osmolality: 285 (ref 275–301)
Potassium: 4.6 mmol/L (ref 3.5–5.1)
SODIUM: 135 mmol/L — AB (ref 136–145)

## 2013-12-13 LAB — CBC WITH DIFFERENTIAL/PLATELET
Basophil #: 0.1 10*3/uL (ref 0.0–0.1)
Basophil %: 1 %
Eosinophil #: 0.3 10*3/uL (ref 0.0–0.7)
Eosinophil %: 3.5 %
HCT: 31.9 % — ABNORMAL LOW (ref 35.0–47.0)
HGB: 10.1 g/dL — ABNORMAL LOW (ref 12.0–16.0)
Lymphocyte #: 2.2 10*3/uL (ref 1.0–3.6)
Lymphocyte %: 29.9 %
MCH: 25.6 pg — AB (ref 26.0–34.0)
MCHC: 31.6 g/dL — ABNORMAL LOW (ref 32.0–36.0)
MCV: 81 fL (ref 80–100)
Monocyte #: 0.6 x10 3/mm (ref 0.2–0.9)
Monocyte %: 8.6 %
NEUTROS ABS: 4.3 10*3/uL (ref 1.4–6.5)
Neutrophil %: 57 %
PLATELETS: 252 10*3/uL (ref 150–440)
RBC: 3.94 10*6/uL (ref 3.80–5.20)
RDW: 16.6 % — ABNORMAL HIGH (ref 11.5–14.5)
WBC: 7.5 10*3/uL (ref 3.6–11.0)

## 2013-12-25 ENCOUNTER — Inpatient Hospital Stay: Payer: Self-pay | Admitting: Internal Medicine

## 2013-12-25 LAB — COMPREHENSIVE METABOLIC PANEL
ALK PHOS: 129 U/L — AB
ALT: 28 U/L
Albumin: 3.3 g/dL — ABNORMAL LOW (ref 3.4–5.0)
Anion Gap: 11 (ref 7–16)
BILIRUBIN TOTAL: 0.4 mg/dL (ref 0.2–1.0)
BUN: 38 mg/dL — ABNORMAL HIGH (ref 7–18)
CO2: 19 mmol/L — AB (ref 21–32)
CREATININE: 2.05 mg/dL — AB (ref 0.60–1.30)
Calcium, Total: 8 mg/dL — ABNORMAL LOW (ref 8.5–10.1)
Chloride: 101 mmol/L (ref 98–107)
EGFR (African American): 30 — ABNORMAL LOW
GFR CALC NON AF AMER: 25 — AB
Glucose: 225 mg/dL — ABNORMAL HIGH (ref 65–99)
OSMOLALITY: 279 (ref 275–301)
POTASSIUM: 4.2 mmol/L (ref 3.5–5.1)
SGOT(AST): 45 U/L — ABNORMAL HIGH (ref 15–37)
Sodium: 131 mmol/L — ABNORMAL LOW (ref 136–145)
Total Protein: 7.4 g/dL (ref 6.4–8.2)

## 2013-12-25 LAB — TROPONIN I
TROPONIN-I: 0.1 ng/mL — AB
Troponin-I: 0.03 ng/mL

## 2013-12-25 LAB — CBC
HCT: 39.9 % (ref 35.0–47.0)
HGB: 12.2 g/dL (ref 12.0–16.0)
MCH: 25.4 pg — AB (ref 26.0–34.0)
MCHC: 30.5 g/dL — ABNORMAL LOW (ref 32.0–36.0)
MCV: 84 fL (ref 80–100)
Platelet: 328 10*3/uL (ref 150–440)
RBC: 4.78 10*6/uL (ref 3.80–5.20)
RDW: 17 % — ABNORMAL HIGH (ref 11.5–14.5)
WBC: 19.1 10*3/uL — AB (ref 3.6–11.0)

## 2013-12-25 LAB — CK TOTAL AND CKMB (NOT AT ARMC)
CK, Total: 73 U/L (ref 26–192)
CK-MB: 2.8 ng/mL (ref 0.5–3.6)

## 2013-12-25 LAB — PRO B NATRIURETIC PEPTIDE: B-Type Natriuretic Peptide: 2961 pg/mL — ABNORMAL HIGH (ref 0–450)

## 2013-12-26 LAB — CBC WITH DIFFERENTIAL/PLATELET
Basophil #: 0.1 10*3/uL (ref 0.0–0.1)
Basophil %: 0.7 %
Eosinophil #: 0.1 10*3/uL (ref 0.0–0.7)
Eosinophil %: 1.4 %
HCT: 32.8 % — ABNORMAL LOW (ref 35.0–47.0)
HGB: 10.4 g/dL — ABNORMAL LOW (ref 12.0–16.0)
LYMPHS ABS: 1.8 10*3/uL (ref 1.0–3.6)
Lymphocyte %: 20 %
MCH: 25.5 pg — ABNORMAL LOW (ref 26.0–34.0)
MCHC: 31.6 g/dL — AB (ref 32.0–36.0)
MCV: 81 fL (ref 80–100)
MONO ABS: 0.7 x10 3/mm (ref 0.2–0.9)
MONOS PCT: 8 %
NEUTROS PCT: 69.9 %
Neutrophil #: 6.2 10*3/uL (ref 1.4–6.5)
Platelet: 249 10*3/uL (ref 150–440)
RBC: 4.07 10*6/uL (ref 3.80–5.20)
RDW: 16.1 % — AB (ref 11.5–14.5)
WBC: 8.8 10*3/uL (ref 3.6–11.0)

## 2013-12-26 LAB — BASIC METABOLIC PANEL
ANION GAP: 9 (ref 7–16)
BUN: 33 mg/dL — AB (ref 7–18)
CALCIUM: 8.4 mg/dL — AB (ref 8.5–10.1)
CHLORIDE: 100 mmol/L (ref 98–107)
Co2: 25 mmol/L (ref 21–32)
Creatinine: 1.63 mg/dL — ABNORMAL HIGH (ref 0.60–1.30)
EGFR (African American): 39 — ABNORMAL LOW
GFR CALC NON AF AMER: 32 — AB
GLUCOSE: 167 mg/dL — AB (ref 65–99)
Osmolality: 279 (ref 275–301)
Potassium: 3.7 mmol/L (ref 3.5–5.1)
Sodium: 134 mmol/L — ABNORMAL LOW (ref 136–145)

## 2013-12-26 LAB — LIPID PANEL
CHOLESTEROL: 133 mg/dL (ref 0–200)
HDL: 36 mg/dL — AB (ref 40–60)
Ldl Cholesterol, Calc: 28 mg/dL (ref 0–100)
TRIGLYCERIDES: 345 mg/dL — AB (ref 0–200)
VLDL Cholesterol, Calc: 69 mg/dL — ABNORMAL HIGH (ref 5–40)

## 2013-12-26 LAB — TROPONIN I: TROPONIN-I: 0.1 ng/mL — AB

## 2013-12-27 LAB — BASIC METABOLIC PANEL
Anion Gap: 5 — ABNORMAL LOW (ref 7–16)
BUN: 25 mg/dL — ABNORMAL HIGH (ref 7–18)
Calcium, Total: 8.4 mg/dL — ABNORMAL LOW (ref 8.5–10.1)
Chloride: 102 mmol/L (ref 98–107)
Co2: 28 mmol/L (ref 21–32)
Creatinine: 1.45 mg/dL — ABNORMAL HIGH (ref 0.60–1.30)
EGFR (Non-African Amer.): 37 — ABNORMAL LOW
GFR CALC AF AMER: 45 — AB
Glucose: 117 mg/dL — ABNORMAL HIGH (ref 65–99)
Osmolality: 276 (ref 275–301)
POTASSIUM: 4.3 mmol/L (ref 3.5–5.1)
Sodium: 135 mmol/L — ABNORMAL LOW (ref 136–145)

## 2013-12-27 LAB — CBC WITH DIFFERENTIAL/PLATELET
Basophil #: 0.1 10*3/uL (ref 0.0–0.1)
Basophil %: 1.3 %
EOS PCT: 3.2 %
Eosinophil #: 0.2 10*3/uL (ref 0.0–0.7)
HCT: 31 % — ABNORMAL LOW (ref 35.0–47.0)
HGB: 9.7 g/dL — ABNORMAL LOW (ref 12.0–16.0)
Lymphocyte #: 1.8 10*3/uL (ref 1.0–3.6)
Lymphocyte %: 28 %
MCH: 25.3 pg — AB (ref 26.0–34.0)
MCHC: 31.3 g/dL — ABNORMAL LOW (ref 32.0–36.0)
MCV: 81 fL (ref 80–100)
MONO ABS: 0.7 x10 3/mm (ref 0.2–0.9)
Monocyte %: 10.4 %
NEUTROS ABS: 3.7 10*3/uL (ref 1.4–6.5)
Neutrophil %: 57.1 %
PLATELETS: 219 10*3/uL (ref 150–440)
RBC: 3.84 10*6/uL (ref 3.80–5.20)
RDW: 16.3 % — AB (ref 11.5–14.5)
WBC: 6.4 10*3/uL (ref 3.6–11.0)

## 2013-12-28 LAB — CBC WITH DIFFERENTIAL/PLATELET
BASOS ABS: 0.1 10*3/uL (ref 0.0–0.1)
BASOS PCT: 1.4 %
Eosinophil #: 0.3 10*3/uL (ref 0.0–0.7)
Eosinophil %: 5 %
HCT: 31.6 % — AB (ref 35.0–47.0)
HGB: 10.1 g/dL — ABNORMAL LOW (ref 12.0–16.0)
LYMPHS ABS: 1.7 10*3/uL (ref 1.0–3.6)
LYMPHS PCT: 28 %
MCH: 25.6 pg — AB (ref 26.0–34.0)
MCHC: 32.1 g/dL (ref 32.0–36.0)
MCV: 80 fL (ref 80–100)
Monocyte #: 0.6 x10 3/mm (ref 0.2–0.9)
Monocyte %: 10.2 %
NEUTROS PCT: 55.4 %
Neutrophil #: 3.4 10*3/uL (ref 1.4–6.5)
Platelet: 234 10*3/uL (ref 150–440)
RBC: 3.96 10*6/uL (ref 3.80–5.20)
RDW: 16.3 % — AB (ref 11.5–14.5)
WBC: 6.2 10*3/uL (ref 3.6–11.0)

## 2013-12-28 LAB — BASIC METABOLIC PANEL
Anion Gap: 9 (ref 7–16)
BUN: 27 mg/dL — AB (ref 7–18)
CHLORIDE: 96 mmol/L — AB (ref 98–107)
Calcium, Total: 8.5 mg/dL (ref 8.5–10.1)
Co2: 27 mmol/L (ref 21–32)
Creatinine: 1.33 mg/dL — ABNORMAL HIGH (ref 0.60–1.30)
EGFR (African American): 50 — ABNORMAL LOW
GFR CALC NON AF AMER: 41 — AB
Glucose: 119 mg/dL — ABNORMAL HIGH (ref 65–99)
Osmolality: 271 (ref 275–301)
POTASSIUM: 4.1 mmol/L (ref 3.5–5.1)
SODIUM: 132 mmol/L — AB (ref 136–145)

## 2014-04-13 ENCOUNTER — Ambulatory Visit
Admit: 2014-04-13 | Disposition: A | Discharge status: Home / Self Care | Payer: Self-pay | Attending: Vascular Surgery | Admitting: Vascular Surgery

## 2014-04-13 LAB — BASIC METABOLIC PANEL
ANION GAP: 8 (ref 7–16)
BUN: 15 mg/dL
CALCIUM: 8.7 mg/dL — AB
Chloride: 100 mmol/L — ABNORMAL LOW
Co2: 24 mmol/L
Creatinine: 1.04 mg/dL — ABNORMAL HIGH
GFR CALC AF AMER: 60 — AB
GFR CALC NON AF AMER: 51 — AB
Glucose: 123 mg/dL — ABNORMAL HIGH
Potassium: 3.8 mmol/L
Sodium: 132 mmol/L — ABNORMAL LOW

## 2014-04-14 ENCOUNTER — Emergency Department
Admit: 2014-04-14 | Disposition: A | Discharge status: Home / Self Care | Payer: Self-pay | Admitting: Emergency Medicine

## 2014-05-05 NOTE — Op Note (Signed)
PATIENT NAME:  Sharon [name], Sharon [name] MR#:  [id number] DATE OF BIRTH:  [birth year]  DATE OF PROCEDURE:  05/27/2012  PREOPERATIVE DIAGNOSIS: Displaced right femoral neck fracture.   POSTOPERATIVE DIAGNOSIS: Displaced right femoral neck fracture.   PROCEDURE PERFORMED: Right hip hemiarthroplasty.   SURGEON: Laurice Record. Holley Bouche., MD   ANESTHESIA: Spinal.   ESTIMATED BLOOD LOSS: 200 mL.   FLUIDS REPLACED: 1800 mL of crystalloid.   DRAINS: Two medium drains to Hemovac reservoir.   IMPLANTS UTILIZED: DePuy size 5 Summit femoral stem (cemented), 43 mm outer diameter Cathcart hip ball, a +0 mm tapered spacer, a 12 mm Cementralizer, and a size 4 cement restrictor.   INDICATIONS FOR SURGERY: The patient is a 81 year old female who fell at home and landed on her right hip and side. She was unable to stand or bear weight due to the right hip pain. X-rays demonstrated a displaced femoral neck fracture. After discussion of the risks and benefits of surgical intervention, the patient expressed understanding of the risks and benefits and agreed with plans for surgical intervention.   PROCEDURE IN DETAIL: The patient was brought into the operating room, and after adequate spinal anesthesia was achieved, the patient was placed in a left lateral decubitus position. Axillary roll was placed, and all bony prominences were well padded. The patient's right hip and leg were cleaned and prepped with alcohol and DuraPrep and draped in the usual sterile fashion. A "timeout" was performed as per usual protocol. A lateral curvilinear incision was made gently curving towards the posterior superior iliac spine. The IT band was incised in line with the skin incision, and fibers of the gluteus maximus were split in line. The piriformis tendon was identified, skeletonized and incised at its insertion to the proximal femur and reflected posteriorly. In a similar fashion, short external rotators were incised and reflected posteriorly. A  T-type posterior capsulotomy was performed. A moderate hemarthrosis was evacuated. The femoral head was removed using a corkscrew device and measured with calipers. It was felt that a 43 mm diameter was appropriate. This was confirmed with ring gauges. Next, the femoral neck cut was performed using an oscillating saw. The acetabulum was inspected and felt to be in good condition. Several small bony fragments were removed from the acetabulum. Pilot hole was created, and conical reamer then inserted for preparation of the femoral canal. Serial broaches were inserted up to a size 5 Summit broach. The calcar region was planed accordingly, and trial reduction was performed with a 43 mm hip ball with first a -3 and subsequently a +0 neck length. The +0 neck length allowed for good equalization of limb lengths and excellent stability. Trial components were removed. The femoral canal was sized, and it was felt that a size 4 cement restrictor was appropriate. The size 4 cement restrictor was inserted to the appropriate depth. Proximal femoral canal was then irrigated with copious amounts of normal saline with antibiotic solution using pulsatile lavage and suctioned dry. The canal was then packed with vaginal packing soaked in dilute Neo-Synephrine. Polymethyl methacrylate cement was prepared in the usual fashion using a vacuum mixer. Vaginal packing was removed and the canal again irrigated and suctioned dry. Cement was introduced in a retrograde fashion and pressurized. A size 5 Summit femoral stem with a 12 mm distal Cementralizer was then positioned and impacted into place. Excess cement was removed using [employer] fast streamer. After adequate curing of the cement, the Morse taper was cleaned and dried. A 43 mm Cathcart hip  ball with a +0 mm neck length was placed on the trunnion and impacted into place. The acetabulum was again inspected and felt to be free of debris. The hip was reduced and placed through range of motion. Good  equalization of limb lengths was appreciated, and excellent stability was appreciated both anteriorly and posteriorly.   The wound was irrigated with copious amounts of normal saline with antibiotic solution using pulsatile lavage and then suctioned dry. Good hemostasis was appreciated. The posterior capsulotomy was repaired using #5 Ethibond. The piriformis tendon was reapproximated on the undersurface of the gluteus medius tendon using #5 Ethibond. Two medium drains were placed in the wound bed and brought out through separate stab incision to be attached to a Hemovac reservoir. IT band was repaired using interrupted sutures of #1 Vicryl. The subcutaneous tissue was approximated in layers using first #0 Vicryl followed by #2-0 Vicryl. Skin was closed with skin staples. Sterile dressing was applied.   The patient tolerated the procedure well. She was transported to the recovery room in stable condition.    ____________________________ Laurice Record. Holley Bouche., MD jph:OSi D: 05/28/2012 43:83:81 ET T: 05/28/2012 07:05:43 ET JOB#: [id number]  cc: Laurice Record. Holley Bouche., MD, <Dictator> JAMES P Holley Bouche MD ELECTRONICALLY SIGNED 05/28/2012 11:17

## 2014-05-05 NOTE — Consult Note (Signed)
Brief Consult Note: Diagnosis: closed disp L femoral neck fx.   Patient was seen by consultant.   Consult note dictated.   Recommend to proceed with surgery or procedure.   Orders entered.   Discussed with Attending MD.   Comments: plan: cemented left hip hemiarthroplasty risks/benefits/alternatives d/w pt surgery planned for am 02/15/12.  Electronic Signatures: Maebelle Munroe (MD)  (Signed 01-Feb-14 14:44)  Authored: Brief Consult Note   Last Updated: 01-Feb-14 14:44 by Maebelle Munroe (MD)

## 2014-05-05 NOTE — Discharge Summary (Signed)
PATIENT NAME:  JESSIA, KIEF MR#:  [id number] DATE OF BIRTH:  [birth year]  DATE OF ADMISSION:  02/14/2012 DATE OF DISCHARGE:  02/18/2012  ADMITTING DIAGNOSIS: Left femoral neck fracture.   DISCHARGE DIAGNOSIS: Left femoral neck fracture.  OPERATION: On 02/15/2012, she had a left hip cemented hemiarthroplasty. Surgeon: Durene Cal, MD.  Anesthesia: None listed on brief op note. EBL: 200 mL. Complications:  None. Implants: DePuy Summit femoral stem 4, -3 mm neck adapter, cement with gentamicin, 12 cent, 4 cement restrictor, 44 mm endoprosthetic head.   HISTORY: The patient is a 81 year old female who sustained a mechanical fall and was brought to [facility] the same day, where x-rays confirmed a femoral neck fracture.   PHYSICAL EXAMINATION: GENERAL: Pleasant, alert, elderly female.  PSYCHIATRIC: Mood and affect appropriate.  SKIN: Warm and intact over left hip.  HEENT: Normocephalic, atraumatic. Sclerae are clear. Oral mucous membranes are slightly dry.  LUNGS: Clear to auscultation bilaterally.  HEART: Regular rate and rhythm. Normal S1 and S2. No murmurs.  ABDOMEN: Soft, nontender, nondistended, positive bowel sounds.  LYMPHATICS: Minimal swelling around the left hip region.  VASCULAR: 2+ DP and PT pulses in the left lower extremity.  NEUROLOGICAL: Motor, light touch sensation exam intact, left lower extremity, at the left foot except for decreased light touch sensation in her nondermatomal distribution which the patient states was present there prior to this fall.  MUSCULOSKELETAL: Mildly tender to palpation over the distal fibula. She has mild tenderness to palpation over the anterior lateral aspect of her left hip. Positive logroll test of left lower extremity. Left lower extremity shortened and externally rotated.   HOSPITAL COURSE: After admission on 02/14/2012, the patient had a left hip hemiarthroplasty on 02/15/2012. On postoperative day one, 02/16/2012, she had a hemoglobin 9.0 and  hematocrit 26.7. Creatinine was 1.37 and BUN 9. Physical therapy was started that day. On postoperative day two, 02/17/2012, her hemoglobin was 9.4 and hematocrit 28.1. She was able to get up with physical therapy. On postoperative day three, 02/18/2012, she was stable for discharge, with the exception of has not had a bowel movement. As long as she has a bowel movement today, she may be discharged today.   CONDITION AT DISCHARGE: Stable.   DISPOSITION: The patient was sent to rehab.   DISCHARGE INSTRUCTIONS:  1. The patient will follow up with [facility] in 2 weeks for a skin check.  2. The patient will do physical therapy whenever is tolerated on the left lower extremity using assisted device as necessary.  3. The patient's diet will be regular.  4. TED hose knee-high bilaterally.  5. Abduction pillow will be used when in the bed and in chair.  6. Dressing can be changed once daily on an as-needed basis.   DISCHARGE MEDICATIONS: 1. Simvastatin 40 mg, 1 tab p.o. at bedtime.  2. Levothyroxine 125 mcg, 1 tab p.o. once daily.  3. Glyburide/metformin 5/500, 2 tabs p.o. b.i.d.  4. Metoprolol 100 mg, 1/2 tab p.o. b.i.d.  5. Lisinopril 40 mg, 1 tab p.o. at bedtime.  6. Gabapentin 600 mg, 1 tab p.o. 6 times daily.  7. Felodipine extended release, 2.5 mg tablet, 1 tab p.o. once daily.  8. PreserVision 1 tab p.o. b.i.d.  9. Acetaminophen/hydrocodone 325/5, 1 tab p.o. q. 4 to 6 hours p.r.n. pain.  10. Acetaminophen/hydrocodone 325/7.5 mg, 1 tab p.o. q. 4 to 6 hours p.r.n. pain.  11. Acetaminophen 500 mg, 1 tab p.o. q. 4 hours p.r.n. pain or temperature greater  than 101.5.   12. Enoxaparin 40 mg subcutaneous once daily.  13. Zofran 4 mg, 1 tab p.o. q. 6 hours p.r.n. nausea or vomiting.  ____________________________ Sherif Millspaugh M. Tretha Sciara, NP amb:cb D: 02/18/2012 09:20:21 ET T: 02/18/2012 09:36:53 ET JOB#: [id number] cc: Kellsie Grindle M. Tretha Sciara, NP, <Dictator> Kem Kays Keywon Mestre FNP ELECTRONICALLY  SIGNED 03/09/2012 11:03

## 2014-05-05 NOTE — Op Note (Signed)
PATIENT NAME:  Sharon [name], Sharon [name] MR#:  [id number] DATE OF BIRTH:  March 20, 1933  DATE OF PROCEDURE:  05/05/2012  PREOPERATIVE DIAGNOSES:  1. Peripheral arterial disease with ulceration, left lower extremity.  2. Nonhealing ulcer, left foot.  3. Hyperlipidemia.  4. Hypertension.  5. Diabetes.   POSTOPERATIVE DIAGNOSES:  1. Peripheral arterial disease with ulceration, left lower extremity.  2. Nonhealing ulcer, left foot.  3. Hyperlipidemia.  4. Hypertension.  5. Diabetes.   PROCEDURE: 1. Ultrasound guidance for vascular access, right femoral artery.  2. Catheter placement in the left popliteal artery from right femoral approach.  3. Aortogram and selective left lower extremity angiogram.  4. Percutaneous transluminal angioplasty of right common and external iliac artery with 6 mm diameter angioplasty balloon.  5. Percutaneous transluminal angioplasty of left popliteal artery with 5 mm diameter angioplasty balloon.  6. StarClose closure device, right femoral artery.   SURGEON: Algernon Huxley, MD  ANESTHESIA: Local with moderate conscious sedation.   ESTIMATED BLOOD LOSS: Approximately 25 mL.   INDICATION FOR PROCEDURE: A 81 year old white female who was referred to our office for nonhealing ulceration of the left lower extremity. She had markedly reduced ABI of about 0.5 on the left and a mild to moderately reduced ABI on the right. She has pain in her left foot most of the time and has a nonhealing ulceration. She is brought to angiography suite for further evaluation and possible treatment. Risks and benefits were discussed. Informed consent was obtained.   DESCRIPTION OF PROCEDURE: The patient is brought to the vascular and interventional radiology suite. Groins were shaved and prepped, and a sterile surgical field was created. The ultrasound was used to access the right femoral artery. This was done under direct ultrasound guidance without difficulty with a Seldinger needle. A J-wire and  5 French sheath were placed. The wire had some difficulty passing lesion at the iliac bifurcation. I used an Advantage wire and was able to cross this lesion. Imaging showed what appeared to be some degree of stenosis there, although it did not visualize particularly well through the femoral sheath initially. The aortogram did show some stenosis there as well as some stenosis at the origin of the right common iliac artery. The left iliac system appeared patent. The renal arteries appeared patent. A 6 mm diameter angioplasty balloon was inflated in the right common and external iliac arteries, and completion angiogram after this showed no hemodynamically significant stenosis at the iliac bifurcation. There was still some moderate disease at the right common iliac artery origin, but at this location, we did not have good configuration for treatment today without kissing balloon intervention, and our primary goal was to treat the left leg. I was able to cross the aortic bifurcation without difficulty, advance to the left femoral head, and selective left lower extremity angiogram was then performed. This showed a normal and patent common femoral artery, profunda femoris artery and superficial femoral artery. The popliteal artery just above the knee had a very short-segment occlusion. It reconstituted distally, and there was a normal tibial trifurcation. There appeared to be 2-vessel runoff distally, this being the peroneal and the anterior tibial artery. The posterior tibial artery was small, but did fill late. The patient was systemically heparinized. I crossed this lesion without difficulty with the Terumo Advantage wire and a Kumpe catheter and confirmed intraluminal flow in the popliteal artery below the knee. I then replaced the wire and treated this lesion with a 5 mm diameter angioplasty balloon, with  excellent angiographic completion result and no significant residual stenosis. At this point, I elected to  terminate the procedure. The sheath was removed. StarClose closure device was deployed in the usual fashion with excellent hemostatic result. The patient tolerated the procedure well and was taken to the recovery room in stable condition.     ____________________________ Algernon Huxley, MD jsd:OSi D: 05/05/2012 12:35:38 ET T: 05/05/2012 12:54:01 ET JOB#: [id number]  cc: Algernon Huxley, MD, <Dictator> Algernon Huxley MD ELECTRONICALLY SIGNED 05/10/2012 9:28

## 2014-05-05 NOTE — H&P (Signed)
Subjective/Chief Complaint Right hip pain   History of Present Illness 81 year old female apparently tripped and fell this marning, landing on her right hip and side. She was unable to stand or bear weight on the right lower extremity due to the hip pain. She denied any other injuries. She denied any loss of consciousness. She was ambulating witha cane at the time of the fall. Of note, she was still receiving PT for rehabilitation of a left hip fracture from February.   Past Med/Surgical Hx:  Macular degeneration:   Peripheral neuropathy:   Hypothyroidism:   Diabetes II:   PAD:   hypertension:   Left hip hemiarthroplasty:   Cholecystectomy:   Appendectomy:   ALLERGIES:  Codeine: Other  HOME MEDICATIONS: Medication Instructions Status  simvastatin 40 mg oral tablet 1 tab(s) orally once a day (at bedtime) Active  glyBURIDE-metformin 5 mg-500 mg oral tablet 2 tab(s) orally 2 times a day Active  metoprolol tartrate 100 mg oral tablet 0.5 tab(s) orally 2 times a day Active  PreserVision oral tablet 1 tab(s) orally 2 times a day Active  acetaminophen 500 mg oral tablet 1 tab(s) orally every 4 hours, As needed, Pain or Temp >101.5 Active  gabapentin 600 mg oral tablet 1 tab(s) orally 3 times a day Active  levothyroxine 112 mcg (0.112 mg) oral tablet 1 tab(s) orally once a day Active  Vitamin D3 1000 intl units oral tablet 1 tab(s) orally once a day Active  ferrous sulfate 325 mg oral tablet 1 tab(s) orally once a day Active   Family and Social History:  Family History Coronary Artery Disease  Hypertension   Social History negative tobacco, negative ETOH, negative Illicit drugs, Lives alone   Place of Living Home   Review of Systems:  Fever/Chills No   Cough No   Sputum No   Abdominal Pain No   Diarrhea No   Constipation No   Nausea/Vomiting No   SOB/DOE No   Chest Pain No   Dysuria No   Physical Exam:  GEN well developed, well nourished, no acute distress    HEENT PERRL, hearing intact to voice, Oropharynx clear   NECK supple   RESP normal resp effort  clear BS  no use of accessory muscles   CARD regular rate  no carotid bruits  No LE edema  no JVD  2/6 murmur   ABD denies tenderness   EXTR Right lower extremity is shortened and externally rotated. Pain is elicited with attempts at range of motion of the hip. No gross tenderness to palpation of the knee. No gross knee effusion.   SKIN Ecchymosis noted to forearms.   NEURO negative tremor, motor/sensory function intact   PSYCH A+O to time, place, person   Lab Results: Thyroid:  14-May-14 11:46   Thyroxine, Free  0.16 (Result(s) reported on 26 May 2012 at 04:54PM.)  Thyroid Stimulating Hormone > 100 (0.45-4.50 (International Unit)  ----------------------- Pregnant patients have  different reference  ranges for TSH:  - - - - - - - - - -  Pregnant, first trimetser:  0.36 - 2.50 uIU/mL)  Hepatic:  14-May-14 11:46   Bilirubin, Total 0.3  Alkaline Phosphatase 93  SGPT (ALT) 29  SGOT (AST)  58  Total Protein, Serum 7.2  Albumin, Serum 3.6  Routine Chem:  14-May-14 11:46   Result Comment DDIMER - REPEATED AND CONFIRMED BY DILUTION  Result(s) reported on 26 May 2012 at 12:58PM.  Result Comment TSH - RESULT OBTAINED BY  DILUTION  Result(s) reported on 26 May 2012 at 01:04PM.  Glucose, Serum  122  BUN 13  Creatinine (comp) 1.02  Sodium, Serum  130  Potassium, Serum  3.2  Chloride, Serum 100  CO2, Serum 21  Calcium (Total), Serum 8.6  Osmolality (calc) 262  Anion Gap 9 (Result(s) reported on 26 May 2012 at 12:19PM.)  Cardiac:  14-May-14 11:46   Troponin I < 0.02 (0.00-0.05 0.05 ng/mL or less: NEGATIVE  Repeat testing in 3-6 hrs  if clinically indicated. >0.05 ng/mL: POTENTIAL  MYOCARDIAL INJURY. Repeat  testing in 3-6 hrs if  clinically indicated. NOTE: An increase or decrease  of 30% or more on serial  testing suggests a  clinically important change)  Routine UA:   14-May-14 11:46   Color (UA) Yellow  Clarity (UA) Clear  Glucose (UA) Negative  Bilirubin (UA) Negative  Ketones (UA) Negative  Specific Gravity (UA) 1.018  Blood (UA) Negative  pH (UA) 5.0  Protein (UA) 30 mg/dL  Nitrite (UA) Negative  Leukocyte Esterase (UA) Negative (Result(s) reported on 26 May 2012 at 12:23PM.)  RBC (UA) 1 /HPF  WBC (UA) 1 /HPF  Bacteria (UA) NONE SEEN  Epithelial Cells (UA) <1 /HPF  Hyaline Cast (UA) 1 /LPF (Result(s) reported on 26 May 2012 at 12:23PM.)  Routine Coag:  14-May-14 11:46   D-Dimer, Quantitative  6.00 (If the D-dimer test is being used to assist in the exclusion of DVT and/or PE, note the following:  In various studies concerning the D-dimer methodology (STA Liatest) in use by this laboratory, it has been reported that with a cut-off value of 0.50 ug/mL FEU, the  negative predictive value regarding the exclusion of thrombosis is within the 95-100% range.  In patients with high pre-test probability of DVT/PE the results of the D-dimer test should be correlated with other diagnostic and clinical assessment modalities." Reference: [employer]., 2005.)  Prothrombin 12.9  INR 1.0 (INR reference interval applies to patients on anticoagulant therapy. A single INR therapeutic range for coumarins is not optimal for all indications; however, the suggested range for most indications is 2.0 - 3.0. Exceptions to the INR Reference Range may include: Prosthetic heart valves, acute myocardial infarction, prevention of myocardial infarction, and combinations of aspirin and anticoagulant. The need for a higher or lower target INR must be assessed individually. Reference: The Pharmacology and Management of the Vitamin K  antagonists: the seventh ACCP Conference on Antithrombotic and Thrombolytic Therapy. ZOXWR.6045 Sept:126 (3suppl): [id number]. A HCT value >55% may artifactually increase the PT.  In one study,  the increase was an average of  25%. Reference:  "Effect on Routine and Special Coagulation Testing Values of Citrate Anticoagulant Adjustment in Patients with High HCT Values." American Journal of Clinical Pathology 2006;126:400-405.)  Routine Hem:  14-May-14 11:46   WBC (CBC)  14.2  RBC (CBC) 4.24  Hemoglobin (CBC)  11.3  Hematocrit (CBC)  34.5  Platelet Count (CBC) 208 (Result(s) reported on 26 May 2012 at 12:15PM.)  MCV 81  MCH 26.6  MCHC 32.8  RDW  15.1    Assessment/Admission Diagnosis Displaced right femoral neck fracture   Plan Recommend right hip hemiarthroplasty The risks and benefits of surgical intervention were discussed in detail with the patient and her son. They expressed understanding of the risks and benefits and agreed with plans for surgery.  The potential risks and benefits of blood transfusion have been discussed with the patient.The patient expressed understanding of the risks and benefits and will sign  the appropriate consent for blood transfusion.   Surgical site signed as per "right site surgery" protocol.   Electronic Signatures: Dereck Leep (MD)  (Signed 14-May-14 19:48)  Authored: CHIEF COMPLAINT and HISTORY, PAST MEDICAL/SURGIAL HISTORY, ALLERGIES, HOME MEDICATIONS, FAMILY AND SOCIAL HISTORY, REVIEW OF SYSTEMS, PHYSICAL EXAM, LABS, ASSESSMENT AND PLAN   Last Updated: 14-May-14 19:48 by Dereck Leep (MD)

## 2014-05-05 NOTE — Consult Note (Signed)
Brief Consult Note: Diagnosis: hip fracture.   Patient was seen by consultant.   Consult note dictated.   Recommend to proceed with surgery or procedure.   Orders entered.   Discussed with Attending MD.   Comments: 81 yo female with h/o DM, HTN, HLD, hypothyroidism s/p fall with hip fx. for surgery tomorrow.  pt is able to do > 4 MET's activity wise and she is at low risk for cv events. ok to proceed with surgery.  Electronic Signatures: James Ivanoff, Roselie Awkward (MD)  (Signed 01-Feb-14 16:09)  Authored: Brief Consult Note   Last Updated: 01-Feb-14 16:09 by James Ivanoff, Roselie Awkward (MD)

## 2014-05-05 NOTE — Op Note (Signed)
PATIENT NAME:  Sharon [name], Sharon [name] MR#:  [id number] DATE OF BIRTH:  December 09, 1933  DATE OF PROCEDURE:  02/15/2012  PREOPERATIVE DIAGNOSIS:  Displaced left femoral neck fracture.   POSTOPERATIVE DIAGNOSIS:  Displaced left femoral neck fracture.   PROCEDURE PERFORMED:  Left hip cemented hemiarthroplasty.   SURGEON:  Maebelle Munroe, MD  ASSISTANT:  None.   COMPLICATIONS:  None apparent.   ESTIMATED BLOOD LOSS:  200 mL.   ANESTHESIA:  Spinal.   SPECIMENS:  None.   DRAINS:  None.   IMPLANTS:  DePuy Summit size 4 femoral stem, size 12 centralizer, size 4 cement restrictor, size -3 neck adapter, and a 44 mm endoprosthetic head.   INDICATIONS:  The patient is a 81 year old female who sustained a fall yesterday with resultant displaced femoral neck fracture indicating need for operative treatment. Risks, benefits, and alternatives were discussed with her to include, but not limited to, bleeding, infection, damage to blood vessels and nerves, need for further surgery and treatment, chronic pain, loss of function, stiffness, allergy, anesthetic risk, DVT, PE, heart, lung, brain and kidney complications, increased risk of infection, wound complications, delayed healing. Given history of diabetes, medical consultation was obtained preoperatively, which deemed that she was medically optimized for surgical intervention.   DESCRIPTION OF PROCEDURE:  After positive identification of the patient in the preoperative holding area, after informed consent had been obtained and the correct operative site been initialed by myself, the patient was taken to the operating room and placed in the supine position. The patient was rolled into the decubitus position. Spinal anesthesia was administered. The patient was transferred to the operating room table and positioned and secured into the pegboard in the right lateral decubitus position. Timeout was performed. IV antibiotics were given before any skin incision had been  made. After positive identification of the patient in the preoperative holding, after informed consent had been obtained and patient was verified prior to initiation of procedure. The left lower extremity was prepped and draped in the usual sterile fashion. Hooded gowns were utilized as well as sterile technique and antibiotic with cement. TEDs and sequentials were applied to the opposite leg. Axillary roll had been placed. A longitudinal incision was made on the posterior aspect of the greater trochanter in a curvilinear fashion. The incision was carried sharply through the skin and subcutaneous tissues. The iliotibial band was incised in line with the skin incision and including the fascia over the gluteus maximus muscle, which was spread in its natural raphe. Charnley self-retaining retractor was placed. The hip was internally rotated; and the abductors were protected with a Cobra retractor, exposing the short external rotators. A subperiosteal dissection and elevation of the short external rotator capsule and piriformis were all elevated in 1 layer with a trailing posterior superior leaf. The sciatic nerve was palpated and protected throughout the procedure. Three #5 Ethibond sutures were used as tag sutures. The femoral head was excised and sized best to 43 mm. I trialed a 44 mm endoprosthetic head and this fit nicely in the acetabulum. Ligamentum teres was excised. The hip was then rotated into a vertical position, where a femoral neck osteotomy was performed at the planned 15 mm with the cutting guide. Femoral neck was excised along with lateral aspect of the femoral neck. A box osteotome with a Charnley canal finding  awl and lateralizing reamer were then used along with a curette. Successive broaching was carried out from a size 2 to size 4 femoral broaches in 1-size increments  with the 4 broach fitting nicely in approximately 25 degrees of anteversion. The trial was initially performed with a 0 head, which  yielded excellent stability though long leg lengths, and the -3 neck adapter was then used with equal leg lengths. No soft tissue extension tightness, no shuck, internal rotation stability to 70 degrees with no hip subluxation with the hip flexed to 90 degrees in neutral and equal leg lengths.   Trial components were removed. The femoral canal was irrigated, brushed, and initially packed with Neo-Synephrine packing and then dry packing. The canal was sized with the cement restrictor and the 4 fit best and with the 12 mm centralizer for the stem. The centralizer was placed approximately 2 cm distal to the tip of the implant, and the cement with gentamicin was then used and pressurized. The implant was femoral stem size 4 DePuy Summit. The stem was placed in approximately 25 degrees of anteversion with the centralizer in place. Cement was hardened. Excess cement was removed. The [facility] taper was cleaned and dried, and the 44 mm endoprosthetic head with -3 adapter was placed with identical leg length stability and soft tissue tension compared to the trial. The wound was copiously irrigated. The capsular layer was repaired through drill holes to the posterolateral aspect of the greater trochanter and intertrochanteric region. The deep fascia was closed with Quill after a copious irrigation, and hemostasis had been obtained. The skin was then closed in layers with 0 PDS, 3-0 PDS, and 2-0 Quill with Dermabond and Aquasol dressings placed. The patient was rolled in a decubitus position with 2+ dorsalis pedis pulse, and equal leg lengths were noted. These findings were relayed to her family as well as her need for followup and compliance.    ____________________________ Maebelle Munroe, MD jfs:si D: 02/15/2012 11:14:39 ET T: 02/16/2012 00:07:06 ET JOB#: [id number]  cc: Maebelle Munroe, MD, <Dictator> Maebelle Munroe MD ELECTRONICALLY SIGNED 02/21/2012 0:04

## 2014-05-05 NOTE — Discharge Summary (Signed)
PATIENT NAME:  ZELLIE, JENNING MR#:  [id number] DATE OF BIRTH:  October 09, 1933  DATE OF ADMISSION:  05/26/2012 DATE OF DISCHARGE:  05/31/2012  ADMITTING DIAGNOSIS: Displaced right femoral neck fracture.   DISCHARGE DIAGNOSIS: Displaced right femoral neck fracture.   CONSULTANT:  Dr. Loletha Grayer; primary care, Dr. Emily Filbert.  HISTORY: The patient is a 81 year old female who states that she had a fall in the a.m. of admission. She felt okay prior to the fall. She had a fall in the same place that she broke the other hip. She normally walks with a cane or a walker. On the morning of her admission, she was walking with a cane. She states that her feet give her a hard time at times secondary to neuropathy and she had a fall and she was found to have had a right hip fracture. She presented to the Emergency Room where x-rays were obtained. This showed a displaced right femoral neck fracture. She subsequently was admitted to the hospital. Consultation was obtained for medical clearance. This was performed by Dr. Leslye Peer. During the hospital course, the patient was followed by Dr. Sabra Heck.  PROCEDURE: Right hip hemiarthroplasty.   ANESTHESIA: Spinal.  IMPLANTS UTILIZED: DePuy size 5 Summit femoral stem, cemented, 43 mm outer diameter Cathcart hip ball, A +0 mm tapered spacer, and a 12 mm centralizer as well as a size 4 cement restrictor.   HOSPITAL COURSE: The patient tolerated the procedure very well. She had no complications. She was then taken to the PAC-U where she was stabilized and then transferred to the orthopedic floor. The patient began receiving anticoagulation therapy of Lovenox 30 mg sub-Q every 12 hours per anesthesia and pharmacy protocol. She was fitted with TED stockings bilaterally. These were allowed to be removed 1 hour per 8 hour shift. She was also fitted with the AV-I compression foot pumps bilaterally set at 80 mmHg.  Her calves have been nontender. There has been no evidence of  DVTs. Heels were elevated off the bed using rolled towels.   The patient has denied any chest pains or shortness of breath. Vital signs have been stable. She has been afebrile. Hemodynamically she was stable and no transfusions were needed. Hemoglobin following surgery did drop to 8.1, but this came back up to 8.6 and subsequently no transfusions were given.   Physical therapy was initiated on day 1 for gait training and transfers. This has been very slow. Occupational therapy was also initiated on day 1 for ADL and assistive devices.   The patient's IV, Foley, and Hemovac were DC'd on day 2 along with a dressing change. The wound was free of any drainage or signs of infection.   DISPOSITION: The patient is being discharged to a skilled nursing facility in improved stable condition.   DISCHARGE INSTRUCTIONS: She may weight bear as tolerated. Continue using TED stockings around-the-clock. These are allowed to be removed 1 hour per 8 hour shift. Elevate the heels off the bed. Incentive spirometer q. 1 hour while awake. Encourage cough and deep breathing q. 2 hours while awake. She is placed on an ADA diet. Keep the wound clean and dry. Staples will need to be removed on 05/29. Apply Benzoin and Steri-Strips. Change dressing otherwise as needed. She will need to be seen in the clinic in 6 weeks. Call the clinic if any temperatures of 101.5 or greater or excessive bleeding. She will continue with PT for gait training and transfers.  OT for ADL and assistive devices. She  is not to take a shower until the staples are removed.   DRUG ALLERGIES: CODEINE.   MEDICATIONS: 1.  Calcium carbonate 500 mg b.i.d.  2.  Vitamin D3 1000 units daily.  3.  Senokot-S 1 tablet b.i.d.  4.  Ferrous sulfate 325 mg daily.  5.  Gabapentin 300 mg t.i.d.  6.  Lactulose 30 mL q. 12 hours. 7.  Synthroid 0.15 mg q. 6 a.m.  8.  Multivitamin tablet once a day.  9.  Pantoprazole 40 mg b.i.d.  10.  Zocor 40 mg at bedtime.  11.   Lovenox 40 mg q. day for 14 days then discontinue and begin taking one 81 mg enteric-coated aspirin.  12.  Metformin 500 mg 2 tablets b.i.d. with meals.  13.  Insulin sliding scale Novolin R. 14.   Tylenol ES 500 to 1000 mg q. 4 to 6 hours p.r.n. for temperatures of 100.4 or greater.  15.  Mylanta 30 mL q. 6 hours p.r.n.  16.  Dulcolax suppository 10 mg rectally p.r.n.  17.  Enema soapsuds if no results with milk of magnesia or Dulcolax.  18.  Oxycodone 5 to 10 mg q. 4 to 6 hours p.r.n. for pain. 19.  Ultram 50 to 100 mg q. 4 hours for pain.  20.  Albuterol inhaler 2 mL q. 6 hours while awake p.r.n.   PAST MEDICAL HISTORY: 1.  Hypertension. 2.  Diabetes.  3.  Peripheral neuropathy.  4.  Macular degeneration.  5.  Peripheral vascular disease. 6.  Hypothyroidism.  ____________________________ Vance Peper, PA jrw:[id number] D: 05/31/2012 06:50:32 ET T: 05/31/2012 07:08:47 ET JOB#: [id number]  cc: Vance Peper, PA, <Dictator> JON WOLFE PA ELECTRONICALLY SIGNED 06/01/2012 7:24

## 2014-05-05 NOTE — Consult Note (Signed)
PATIENT NAME:  Sharon [name], Sharon [name] MR#:  [id number] DATE OF BIRTH:  1933-02-04  DATE OF CONSULTATION:  05/26/2012  REFERRING PHYSICIAN:  Laurice Record. Holley Bouche., MD  CONSULTING PHYSICIAN:  Tana Conch. Leslye Peer, MD PRIMARY CARE PHYSICIAN: Emily Filbert, MD   REASON FOR CONSULTATION: Preoperative and postoperative management for hip fracture.   HISTORY OF PRESENT ILLNESS: This is a 81 year old female who states that she had a fall this a.m.  She felt okay prior to the fall. She had a fall in the same place that she broke the other hip. She normally walks with a cane or a walker. This morning she was walking with a cane. Her feet always give her a hard time with the neuropathy, and she had a fall and she was found to have a right hip fracture. She is in 9 out of 10 in intensity pain. She was on the floor for a total of 2 hours.  In the ER, she was found to be hypoxic with a pulse ox of 80% on presentation, but on 2 liters of nasal cannula she is up to 98%. No complaints of chest pain or shortness of breath. She does have some fatigue. Hospitalist services were contacted for evaluation. The patient is not the best historian on what events  transpired this morning likely secondary to the pain meds she had received in the Emergency Room.   PAST MEDICAL HISTORY: Hypertension, diabetes, neuropathy, macular degeneration, peripheral vascular disease, hypothyroidism.   PAST SURGICAL HISTORY: Left hip repair, vascular surgery left leg, appendectomy, cholecystectomy.   ALLERGIES: No known drug allergies.   MEDICATIONS: As per Prescription Writer include: Acetaminophen 500 mg every 4 hours, ferrous sulfate 325 mg daily, gabapentin 600 mg 3 times a day, glyburide/metformin 5/500 two tablets twice a day, levothyroxine 112 mcg daily, metoprolol 100 mg 0.5 mg twice a day, PreserVision 1 tablet twice a day, simvastatin 40 mg at bedtime, vitamin D3 1000 international units daily.   SOCIAL HISTORY: No smoking. No alcohol. No  drug use. Lives alone. 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  FAMILY HISTORY: Father died at 23 of old age, had hypertension. Mother died at 53, had an MI.   REVIEW OF SYSTEMS:   CONSTITUTIONAL: Positive for some sweats. No fever or chills. Positive for fatigue. No weight loss. No weight gain.  EYES: She does have macular degeneration, wears glasses.  EARS, NOSE, MOUTH AND THROAT: Positive for runny nose. Positive for hoarse voice. No trouble swallowing.  CARDIOVASCULAR: No chest pain. No palpitations.  RESPIRATORY: No shortness of breath. No coughing. No sputum. No hemoptysis.  GASTROINTESTINAL: Positive for abdominal pain and some constipation. No bright red blood per rectum. No melena.  GENITOURINARY: No burning on urination.  No hematuria.  MUSCULOSKELETAL: Positive for right hip pain and pain in her legs.  INTEGUMENTARY: Positive for itching all over.  NEUROLOGIC: No fainting or blackouts.  PSYCHIATRIC: No anxiety or depression.  ENDOCRINE: Positive for hypothyroidism.  HEMATOLOGIC/LYMPHATIC: Positive for easy bruising and anemia.   PHYSICAL EXAMINATION: VITAL SIGNS: Temperature 97.5, pulse 65, respirations 20, blood pressure 186/78, pulse ox 98% on oxygen, initially on presentation to the hospital pulse ox 80% on room air.  GENERAL: No respiratory distress.  HEENT: Eyes: Conjunctivae and lids normal. Pupils equal, round, and reactive to light. Extraocular muscles intact. No nystagmus. Ears, nose, mouth, and throat: Tympanic membranes: No erythema. Nasal mucosa: No erythema. Throat: No erythema. No exudate seen. Lips and gums: No lesions.  NECK: No JVD.  No bruits. No lymphadenopathy. No thyromegaly. No thyroid nodules palpated.  RESPIRATORY:  Lungs clear to auscultation. No use of accessory muscles to breathe. No rhonchi, rales or wheeze heard.  CARDIOVASCULAR: S1, S2 normal, +5/7 systolic ejection murmur. Carotid upstroke 2+ bilaterally. No bruits.  EXTREMITIES: Dorsalis  pedis pulses 1+ bilaterally. Trace edema of the lower extremity.  ABDOMEN: Soft, nontender. No organomegaly/splenomegaly. Normoactive bowel sounds. No masses felt.   LYMPHATIC: No lymph nodes in the neck.  MUSCULOSKELETAL: Trace edema. No clubbing. No cyanosis.  SKIN: No rashes or ulcers seen.  NEUROLOGICAL: Cranial nerves II through XII grossly intact. Deep tendon reflexes not tested secondary to hip fracture.  PSYCHIATRIC: The patient is oriented to person, place and time.   LABORATORY AND RADIOLOGICAL DATA:  Chest x-ray: No evidence of cardiopulmonary disease. Hip: Femoral neck fracture. CT scan of the chest for pulmonary embolism showed no pulmonary embolism, no thoracic aortic pathology. Emphysematous changes both lungs, atelectasis lung base. Ultrasound bilateral lower extremities was negative for DVT.   D-dimer elevated at 6.0. TSH greater than 100, INR 1.0. Troponin negative. White blood cell count 14.2, H and H 11.3 and 34.5, platelet count of 208.  Glucose 122, BUN 13, creatinine 1.02, sodium 130, potassium 3.2, chloride 100, CO2 of 21, calcium 8.6. Liver function tests: AST slightly elevated at 58. Urinalysis negative.   ASSESSMENT AND PLAN: 1.  Preoperative hip fracture:   I see no contraindications to procedure at this time. I think the patient's acute respiratory failure is likely secondary to atelectasis. The patient does have an abnormal EKG with flipped T waves laterally but no complaints of chest pain or shortness of breath. I will put the patient on telemetry and check a few more troponins, but the patient should be able to go to the operating room without further cardiac testing.  2.  Acute respiratory failure:  The patient is on oxygen 2 liters. I think this is likely atelectasis, which was seen on CT scan. I will give a DuoNeb nebulizer solution nebulizer.  I think oxygen will help. Continue to monitor closely.  3.  Abnormal EKG: No complaints of chest pain or shortness of  breath. The patient is already on preop metoprolol.  I will check a few more troponins. No further cardiac testing.  If enzymes are negative, may proceed with surgery.  4.  Severe hypothyroidism:  TSH greater than 100.  Increased levothyroxine to 150 mcg daily. This will take 6 weeks to improve  5.  Hypertension:  Blood pressure currently stable on routine medications.  6.  Diabetes: We will put on sliding scale.  7.  Neuropathy: On gabapentin.  8.  Peripheral vascular disease:  Hold blood thinners at this time. The patient states that she does take aspirin as outpatient.  9.  Hyponatremia: This looks chronic to me, probably secondary to the severe hypothyroidism. This will probably not improve quickly secondary to the severe hypothyroidism.  10.  Hypokalemia:  We will give a potassium run and check a magnesium in the morning.  TIME SPENT ON CONSULTATION: 55 minutes.   CODE STATUS: The patient is a DNR.   ____________________________ Tana Conch. Leslye Peer, MD rjw:cb D: 05/26/2012 16:22:41 ET T: 05/26/2012 16:44:05 ET JOB#: [id number]  cc: Tana Conch. Leslye Peer, MD, <Dictator> Laurice Record. Holley Bouche., MD Rusty Aus, MD Marisue Brooklyn MD ELECTRONICALLY SIGNED 06/04/2012 13:29

## 2014-05-05 NOTE — Consult Note (Signed)
PATIENT NAME:  Sharon [name], Sharon [name] MR#:  [id number] DATE OF BIRTH:  02-10-1933  DATE OF CONSULTATION:  02/14/2012  REFERRING PHYSICIAN:  Maebelle Munroe, MD, of orthopedics CONSULTING PHYSICIAN:  Slidell Sink, MD  REASON FOR ADMISSION:  Hip fracture.   REASON FOR CONSULTATION:  Medical management and surgical clearance.   REFERRING EMERGENCY ROOM PHYSICIAN:  John H. Jasmine December, MD  PRIMARY CARE PHYSICIAN:  Rusty Aus, MD, from the [facility].   HISTORY OF PRESENT ILLNESS:  The patient is a very nice 81 year old female with history of hypertension, hyperlipidemia, hypothyroidism who has been in her normal state of health. Unfortunately this morning, she got up out of bed around 10:00 a.m. trying to get ready to go meet some people at [facility] this morning. She tripped and fell and hit her left hip on the floor having a left femoral fracture. She was not able to get up and she had significant pain. Her pain was about 10 out of 10. She was able to move a little bit, grabbed the phone to call her neighbor, her neighbor called the EMS and she was brought to the ER where she was diagnosed with a left femoral fracture. She has been evaluated by Dr. Mardi Mainland of orthopedics who is going to take her tomorrow morning for hip hemiarthroplasty. The patient has been doing okay otherwise. She takes aspirin, but no other blood thinners. The aspirin has been held today. She is overall healthy. She does not have any history of coronary artery disease. She has not been taking good care of her diabetes apparently lately, but her blood sugars have not been really high during her whole history of diabetes. She is able to complete more than 4 MET of activity. She does not have any chest pain or shortness of breath with activity at all. No claudication.   REVIEW OF SYSTEMS:  A 12 system review of systems is done.  CONSTITUTIONAL: No fever, no fatigue. No weakness. No weight loss or weight gain.  EYES: No  significant blurry vision, double vision, glaucoma or inflammation. She has occasional dry eyes.  ENT: No tinnitus. No hearing loss. No allergies. No sinus pain. No difficulty swallowing. No snoring.  RESPIRATORY: No cough. No wheezing. No COPD. No asthma.  CARDIOVASCULAR: No chest pain. No orthopnea. No edema. No arrhythmias. No syncopal episodes. No palpitations. No varicose veins.  GASTROINTESTINAL: No nausea, vomiting, diarrhea. No abdominal pain. No melena. No rectal bleeding. No jaundice.  GENITOURINARY: No dysuria, hematuria or changes in frequency.  GYNECOLOGIC: No breast masses. No vaginal discharge. No dysfunctional vaginal bleeding.  ENDOCRINE: No polyuria, polydipsia, or polyphagia. No cold or heat intolerance. She has diabetes and hypothyroidism, but she states that she had no symptoms out of this.  HEMATOLOGIC/LYMPHATIC: No easy bruising. No swollen glands. No anemia.  MUSCULOSKELETAL: No significant joint swelling. Positive left hip pain today due to the fracture. No gout.  NEUROLOGIC: No numbness, tingling. No CVA. No TIA. No ataxia.  PSYCHIATRIC: No insomnia. No nervousness.   PAST MEDICAL HISTORY:   1.  Hypertension.  2.  Diabetes.  3.  Hypothyroidism.  4.  Dyslipidemia.   ALLERGIES:  No known drug allergies.   PAST SURGICAL HISTORY:   1.  Appendectomy.  2.  Cholecystectomy.   FAMILY HISTORY:  Positive for coronary artery disease in her mom and her dad, but she is not aware of any strokes or heart attacks. Denies cancer, diabetes or hypertension in her family.   SOCIAL HISTORY:  The patient lives by herself. She is retired. She does not smoke and does not drink. She used to be very active, but within the past couple of years, she is not exercising, although she keeps herself active inside the house doing some cleaning and picking up stuff.   MEDICATIONS:   gabapentin 600 mg 6 times a day, glyburide/metformin 5/500 mg 2 tablets twice daily, levothyroxine 125 mg once  daily, lisinopril 40 mg once a day, metoprolol , vitamins twice daily, simvastatin 40 mg once a day.   PHYSICAL EXAMINATION: VITAL SIGNS: Blood pressure 123/71, pulse 62, respiratory rate 20, temperature 96.9 and oxygen saturation above 98% on room air.  GENERAL: The patient is alert and oriented x 3. No acute distress. No respiratory distress. Hemodynamically stable.  HEENT: Her pupils are equal and reactive. Extraocular movements are intact. Mucosa is dry. No oral lesions. Anicteric sclerae. Pink conjunctivae.  NECK: Supple. No JVD. No thyromegaly. No adenopathy. No carotid bruits. No rigidity.  CARDIOVASCULAR: Regular rate and rhythm. No murmurs, rubs or gallops. No displacement of PMI. No tenderness to palpation of anterior chest wall.  LUNGS: Clear without any wheezing or crepitus. No use of accessory muscles.  ABDOMEN: Soft, nontender, nondistended. No hepatosplenomegaly. No masses. Bowel sounds are positive. No rebound.  EXTREMITIES: No edema, no cyanosis, no clubbing. Left lower extremity is internally rotated with pain to mobilization at the level of the hip. No significant bruising or swelling at this moment at the level of the hip. Capillary refill is less than 3 seconds. Pulses +2. Sensation is intact distally.  MUSCULOSKELETAL: Positive joint deformity with internal rotation of the left lower extremity at the level of the hip.  NEUROLOGIC: Cranial nerves II through XII intact. No focal abnormalities.  PSYCHIATRIC: No anxiety. The patient is alert and oriented x 3.  SKIN: Without any rashes or petechiae.  LYMPHATICS: Negative for lymphadenopathy in the neck, supraclavicular areas or axilla.   DIAGNOSTIC DATA:  Chest x-ray: No acute abnormalities. Hip x-rays showed a fracture of the left femoral neck which is displaced and impacted. EKG: Normal sinus rhythm. No LVH. No ST depression or elevation.   ASSESSMENT AND PLAN:  A 81 year old female with history of hypertension, diabetes,  hypothyroidism, dyslipidemia who comes with hip fracture after a mechanical fall.  1.  Hip fracture. Case discussed with Dr. Mardi Mainland who states that he wants to take the patient for surgery tomorrow morning for a left hemiarthroplasty. The patient is cleared for surgery. She is able to complete more than 4MET's of activity. The patient is at low risk for any cardiovascular complications at this moment.  2.  Hypertension. The patient is to continue her blood pressure medications, hold them tomorrow morning. Blood pressure seems to be well controlled.  3.  Diabetes. We are going to hold her diabetes medication at this moment. The patient is going to be n.p.o. after midnight. She has not eaten anything today. Her blood sugars are around 150 and that might be due to stress. She does not have any good control of her blood sugars due to noncompliance with monitoring, but she states that she has been taking her blood sugars. She is eating fast food on a regular basis. We are going to check a hemoglobin A1c and monitor closely. Insulin sliding scale written.  4.  Acute kidney injury. Her creatinine is 1.36 likely due to her fracture, not eating or drinking. She looks moderately dehydrated. We are going to give her intravenous fluids today with  normal saline.  5.  Hyponatremia. This is likely due to dehydration. We are going to put her on intravenous fluids.  6.  Hypothyroidism. The patient has a TSH of 34. At this moment, I do not think this is going to prevent her from having her surgery, although we are going to increase the dose of levothyroxine. Apparently, the patient has been taking this medication, although she does not seem to be very compliant with medications in general.  7.  Increased white blood cells likely due to the trauma and stress.  8.  There are no signs of infection. Her urinalysis is clean.  9.  Dyslipidemia, continue statin. We are going to check her lipid profile since compliance of  medication . 10.  Deep vein thrombosis prophylaxis. At this moment, this is mechanical. The patient is going to have surgery in the morning. Tomorrow, we can start Lovenox.   TIME SPENT:  I spent about 50 minutes with this patient.   CODE STATUS:  The patient is a full code.   Thank you, Dr. Mardi Mainland, for allowing me to participate in the care of your patient. Please call me with any questions. The patient's care is going to be transferred to Dr. Sabra Heck in the morning.    ____________________________ [place] Sink, MD rsg:si D: 02/14/2012 16:27:00 ET T: 02/15/2012 15:49:17 ET JOB#: [id number]  cc: [place] Sink, MD, <Dictator> Midori Dado America Brown MD ELECTRONICALLY SIGNED 02/24/2012 13:38

## 2014-05-05 NOTE — Consult Note (Signed)
PATIENT NAME:  Sharon [name], RECHT MR#:  [id number] DATE OF BIRTH:  27-Mar-1933  DATE OF CONSULTATION:  02/15/2012  REFERRING PHYSICIAN:   CONSULTING PHYSICIAN:  Maebelle Munroe, MD  HISTORY OF PRESENT ILLNESS: [name] is immediately postoperative from this morning, from her cemented left hip hemiarthroplasty. She did have moderate pain, which was well controlled with a Demerol and Phenergan injection.   Her brother Silverio Lay is accompanying her this evening.   PHYSICAL EXAMINATION:   VITALS: Temperature 99.4, pulse 78, respirations 20, blood pressure 162/89 and 95% saturation on 2 liters of oxygen.   LEFT HIP: Exam reveals that the dressing is clean, dry and intact. Motor and light touch sensation examination are intact at the left foot.   RADIOGRAPHS: Total hip components in good position. The cement technique is not ideal, although it is acceptable.   IMPRESSION: Status post left hip hemiarthroplasty, doing well.   DISCHARGE PLANNING: Start Lovenox tomorrow morning, physical therapy, weight-bearing as tolerated, posterior hip precautions, and Dr. Dawayne Patricia will assume care at 5:00 tomorrow morning. ____________________________ Maebelle Munroe, MD jfs:sb D: 02/15/2012 18:41:18 ET     T: 02/16/2012 07:22:21 ET        JOB#: [id number] cc: Maebelle Munroe, MD, <Dictator> Maebelle Munroe MD ELECTRONICALLY SIGNED 02/21/2012 0:05

## 2014-05-05 NOTE — H&P (Signed)
PATIENT NAME:  Sharon [name], Sharon [name] MR#:  [id number] DATE OF BIRTH:  1933-07-04  DATE OF ADMISSION:  02/14/2012  CHIEF COMPLAINT: Left hip pain.  HISTORY OF PRESENT ILLNESS: The patient is a 81 year old female who sustained a mechanical fall today with a resultant displaced left femoral neck fracture which was diagnosed after she was brought to the [facility] Emergency Department. She complains of sharp, intermittent pain over the lateral aspect of her left hip. She did have some antecedent hip pain on the left side.   REVIEW OF SYSTEMS: NEUROLOGIC: No loss of consciousness. RESPIRATORY: No shortness of breath.  CARDIAC: No chest pain.  ENDOCRINE: A history of diabetes, controlled with p.o. medications.  SKIN: No laceration. GASTROINTESTINAL: No nausea, vomiting or diarrhea.  GENITORURINARY: No dysuria  MUSCULOSKELETAL: Complains of left hip and left ankle pain.   All other review of systems are negative.  PAST MEDICAL HISTORY: Remarkable for diabetes, hypertension, insomnia, environmental allergies, hypercholesterolemia, hypothyroidism, and GERD.   PAST SURGICAL HISTORY: noncontributory   CURRENT MEDICATIONS INCLUDE: Ambien, aspirin, Flonase, gabapentin, Glyburide, metformin, Hemocyte, Lipitor, lisinopril, Nexium, Os-Cal, Synthroid, and Toprol.   ALLERGIES: CODEINE AND PLENDIL.   SOCIAL HISTORY: Quit smoking in 1993, currently lives alone.   FAMILY HISTORY: Positive for peripheral vascular disease and heart disease.   PHYSICAL EXAMINATION: GENERAL: A pleasant, alert elderly female presenting with a friend who is a Industrial/product designer.  PSYCHIATRIC: Mood and affect appropriate.  SKIN: Warm and intact over the left hip  HEENT: Normocephalic, atraumatic. Sclera clear. Oral mucosa membranes are slightly dry.  VITAL SIGNS: On presentation to the Emergency Department, temperature 96.9, respirations at 18, pulse of 72. Blood pressure 145/69, 98% saturation on room air.  LUNGS:  Clear to auscultation bilaterally.  HEART: Regular rate and rhythm. Normal S1 and S2, no murmurs or gallops.  ABDOMINAL EXAMINATION: Soft, nontender, nondistended, positive bowel sounds.  LYMPHATIC EXAMINATION: Minimal swelling around the left hip region.  VASCULAR EXAMINATION: 2+ dorsalis pedis and posterior tibialis pulse, left lower extremity. NEUROLOGIC EXAMINATION: Motor and light touch sensation examination intact, left lower extremity, at the left foot except for decreased light touch sensation in her nondermatomal distribution which the patient states was present there prior to this fall.  MUSCULOSKELETAL EXAMINATION: Mild tenderness to palpation over the distal fibula. The remainder of the ipsilateral left lower extremity, nontender to palpation except for mild tenderness to palpation over the anterolateral aspect of the left hip. Positive log roll test, left lower extremity. The left lower extremity mildly shortened and externally rotated.   LABORATORY, DIAGNOSTIC, AND RADIOLOGICAL DATA: Laboratory evaluation today reveals mild elevation in creatinine at 1.36 and decreased sodium at 128 and elevated glucose at 157.   TSH is quite high at 34.5.  Hemoglobin and hematocrit today 12.4, and 37.4 211,000 platelets. PT and INR are normal at 12.5 and 0.9, and urinalysis is unremarkable.   Radiographs of the left hip and AP pelvis demonstrate a comminuted displaced left femoral neck fracture.   IMPRESSION:  1. Closed displaced left femoral neck fracture.  2. Hyponatremia.  3. Mild renal insufficiency.  4. Diabetes.  5. Hypothyroidism with undercorrection of exogenous thyroid.   PLAN: We will await the Hospitalist evaluation for medical opinion on medical observation prior to surgical intervention.   I have recommended a left hip cemented hemiarthroplasty to the patient. The risks, benefits, alternatives were discussed with her to include, not limited to, bleeding, infection, damage to blood  vessels or nerves, need for surgery and treatment,  chronic pain, loss of function, stiffness, allergy, anesthetic risk, DVT, PE, heart, lung, brain and kidney complications, increased risk of infection, slower healing and wound problems, including dehiscence and lack of healing given history of diabetes. The specific hip risks discussed including, but not limited to, calcar fracture, periprosthetic instability, and leg length inequality. She appeared to understand the risks, benefits, and desire proceed with surgical treatment. Assuming medical optimization is deemed to be in place, we will proceed with her surgery tomorrow morning. I have also ordered left ankle x-rays for evaluation of a possible left ankle fracture.   ____________________________ Maebelle Munroe, MD jfs:jm D: 02/14/2012 14:53:29 ET T: 02/14/2012 17:04:21 ET JOB#: [id number]  cc: Maebelle Munroe, MD, <Dictator> Maebelle Munroe MD ELECTRONICALLY SIGNED 02/15/2012 12:29

## 2014-05-06 NOTE — Consult Note (Signed)
PATIENT NAME:  Sharon [name], Sharon [name] MR#:  [id number] DATE OF BIRTH:  July 09, 1933  DATE OF CONSULTATION:  08/18/2013  CONSULTING PHYSICIAN:  [name], MD  PRIMARY CARE PHYSICIAN:  Dr. Sabra Heck.  CHIEF COMPLAINT: Status post cardiopulmonary arrest.   HISTORY OF PRESENT ILLNESS: The patient is a 81 year old female referred for evaluation following cardiopulmonary arrest. The patient was recently hospitalized 20/23/3435 for diastolic congestive heart failure and atrial fibrillation, at which time echocardiogram revealed normal left ventricular function. CHF was felt to be due to uncontrolled atrial fibrillation with a rapid ventricular rate. The patient complained of progressive shortness of breath, called EMS, and upon arrival, the patient was started on CPAP. She presented to [facility] Emergency Room where she experienced cardiopulmonary arrest and CPR was initiated. The patient experienced apparent, vomiting and likely aspirated and was admitted to the CCU on a ventilator. Admission labs include and initial negative troponin less than 0.02 with a followup of 0.06. EKG revealed atrial fibrillation with a rapid ventricular rate. White count was elevated to 24,000.   PAST MEDICAL HISTORY: 1.  Atrial fibrillation.  2.  Hypertension.  3.  Diabetes.  4.  Diabetic neuropathy.  5.  Macular degeneration.  6.  Peripheral vascular disease.   MEDICATIONS: Aspirin 81 mg daily, simvastatin 40 mg at bedtime, Toprol-XL 50 mg q.a.m. and 100 mg q.p.m., Demadex 10 mg b.i.d., potassium chloride 10 mEq b.i.d., gabapentin 600 mg t.i.d., Synthroid 112 mcg daily, ferrous sulfate 325 mg daily, Dacosta 1 b.i.d., Cymbalta 30 mg daily.   SOCIAL HISTORY: The patient currently lives alone at home. She has no prior history of tobacco or EtOH use.   FAMILY HISTORY: Mother with history of myocardial infarction.   REVIEW OF SYSTEMS:  Were not obtainable. The patient currently intubated on a ventilator.   PHYSICAL  EXAMINATION: VITAL SIGNS: Blood pressure 86/68, pulse 58, respirations 16, temperature 97.6, pulse oximetry 100%.  HEENT: The patient intubated.  NECK: Supple without thyromegaly.  LUNGS: Clear.  HEART: Normal JVP. Normal PMI. Irregular, irregular rhythm. Normal S1, S2. No appreciable gallop, murmur, or rub.  ABDOMEN: Soft and nontender. Pulses were intact bilaterally.  MUSCULOSKELETAL: Normal muscle tone.  NEUROLOGIC: The patient unresponsive on ventilator.   IMPRESSION: A 81 year old female presents following cardiopulmonary arrest of unknown etiology. EKG does not show evidence of any acute ischemic changes. The patient has normal left ventricular function. It is unlikely the patient had a primary cardiac event.   RECOMMENDATIONS: 1.  Agree with current therapy.  2.  Would defer full dose anticoagulation. 3.  Broad spectrum antibiotics for probable aspiration pneumonia.  4.  Diuresis with intravenous furosemide as needed.  5. Further recommendations pending the patient's initial clinical course.   ____________________________ [name], MD ap:ds D: 08/18/2013 12:50:39 ET T: 08/18/2013 14:08:44 ET JOB#: [id number]  cc: [name], MD, <Dictator> [name] MD ELECTRONICALLY SIGNED 08/23/2013 13:57

## 2014-05-06 NOTE — H&P (Signed)
PATIENT NAME:  Sharon [name], Sharon [name] MR#:  [id number] DATE OF BIRTH:  1933-12-01  DATE OF ADMISSION:  11/27/2013  PRIMARY CARE PHYSICIAN:  Rusty Aus, MD   REFERRING PHYSICIAN:  Ahmed Prima, MD   ADMITTING DIAGNOSIS:  Pulmonary edema and shortness of breath.   HISTORY OF PRESENT ILLNESS:  This is a 81 year old Caucasian female who presents to the Emergency Department complaining of shortness of breath. The patient states that she called her son as a routine phone call to pick up some items from the store and no less than 30 minutes later was acutely short of breath and gasping for air. She denies any chest pain but admits that her upper abdomen and ribs are somewhat sore from breathing so hard. In the Emergency Department, the patient's initial EKG showed atrial fibrillation clearly with a high ventricular rate, although there was a lot of movement and noise on the background tracing. The patient's heart rate soon slowed, which was after the initiation of BiPAP, and showed a normal sinus rhythm that had some J-point elevation. The patient was much more comfortable and no longer using increased work of breathing. A chest x-ray showed some pulmonary edema, and the Emergency Department gave the patient 20 mg of IV Lasix. By the time of my exam, she was very comfortable and able to talk over the BiPAP. Her only complaint was that she was somewhat sleepy. Due to flash pulmonary edema and need for supplemental oxygen, the Emergency Department called for admission.   REVIEW OF SYSTEMS: CONSTITUTIONAL:  The patient denies fever or weakness.  EYES:  Denies blurred vision or inflammation.  EARS, NOSE, AND THROAT:  Denies tinnitus or difficulty swallowing.  RESPIRATORY:  Admits to shortness of breath but denies cough.  CARDIOVASCULAR:  Denies chest pain or orthopnea but admits to some dyspnea on exertion at times. The patient also admits to palpitations at times.  GASTROINTESTINAL:  Denies nausea,  vomiting, diarrhea, or abdominal pain.  GENITOURINARY:  Denies dysuria, increased frequency, or hesitancy.  ENDOCRINE:  Denies polyuria or polydipsia.  HEMATOLOGIC AND LYMPHATIC:  Denies easy bruising or bleeding.  INTEGUMENTARY:  Denies rashes or lesions.  MUSCULOSKELETAL:  Admits to generalized aches and pains but denies myalgias.  NEUROLOGIC:  Denies numbness in her extremities or difficulty speaking.  PSYCHIATRIC:  Denies depression or suicidal ideation, but her son points out that she does exhibit symptoms of some dementia.   PAST MEDICAL HISTORY:  Atrial fibrillation, hypothyroidism, hypertension, diabetes type 2 with macular degeneration and neuropathy, hyperlipidemia, peripheral vascular disease, anemia, and anxiety.   PAST SURGICAL HISTORY:  Bilateral hip replacement, left leg revascularization, appendectomy, and cholecystectomy.   SOCIAL HISTORY:  The patient lives alone. She does not smoke, drink, or do any drugs. She is usually ambulatory and does all of her own activities of daily living.   FAMILY HISTORY:  Her mother is deceased of heart disease.   MEDICATIONS: 1.  Amiodarone 200 mg 1 tab p.o. daily.  2.  Aspirin 81 mg 1 tab p.o. daily.  3.  Docusate 10 mg per mL oral solution 10 mL orally b.i.d.  4.  Ferrous sulfate 325 mg 1 tab p.o. daily.  5.  Gabapentin 400 mg 1 capsule p.o. b.i.d.  6.  Glipizide extended release 2.5 mg tablet 1 tab p.o. daily.  7.  Levothyroxine 112 mcg 1 tab p.o. daily.  8.  Lisinopril 5 mg 1 tab p.o. daily.  9.  Metoprolol tartrate 25 mg 1 tab p.o. b.i.d.  10.  Pantoprazole 40 mg 1 tab p.o. daily.  11.  PreserVision oral tablet 1 tab p.o. b.i.d.  12.  Simvastatin 40 mg 1 tab p.o. at bedtime.  13.  Vitamin D3, 1000 international units 1 tablet p.o. daily.   ALLERGIES:  CODEINE AND CYMBALTA.   PERTINENT LABORATORY RESULTS AND RADIOGRAPHIC FINDINGS:  Serum glucose is 176, BUN 17, creatinine 1.29, sodium 136, potassium 4.1, chloride 106, bicarbonate  21, and calcium is 7.7. Troponin is 0.07. White blood cell count is 12.5, hemoglobin 12, hematocrit 38.3, and platelet count is 255,000. ABG shows a pH of 7.34, pCO2 of 33, and pO2 of 79 with a base excess of negative 7 on FiO2 of 36 by nasal cannula.   Chest x-ray shows vascular congestion and pulmonary edema consistent with CHF.   PHYSICAL EXAMINATION: VITAL SIGNS:  Temperature is 97.6, pulse 77, respirations 21, blood pressure 92/50 with a mean of 64, and pulse oximetry is 94% on noninvasive positive pressure ventilation.  GENERAL:  The patient is alert and oriented x 3 and in no distress at the time of my examination.  HEENT:  Normocephalic, atraumatic. Pupils are equal, round, and reactive to light and accommodation. Extraocular movements are intact. Mucous membranes are moist.  NECK:  Trachea is midline. No adenopathy.  CHEST:  Symmetric and atraumatic.  CARDIOVASCULAR:  Regular rate and rhythm. Normal S1 and S2. No rubs, clicks, or murmurs appreciated.  LUNGS:  Mildly diminished breath sounds at the bases without rales or rhonchi. The patient is on BiPAP, but is no longer using accessory muscles.  ABDOMEN:  Positive bowel sounds. Soft, nontender, nondistended. No hepatosplenomegaly.  GENITOURINARY:  Deferred.  MUSCULOSKELETAL:  The patient moves all 4 extremities equally. There is 5/5 strength in upper and lower extremities bilaterally.  SKIN:  No rashes or lesions.  EXTREMITIES:  No clubbing or cyanosis. There is trace edema of her ankles.  NEUROLOGIC:  Cranial nerves II through XII are grossly intact.  PSYCHIATRIC:  Mood is normal. Affect is congruent.   ASSESSMENT AND PLAN:  This is a 81 year old female admitted for pulmonary edema and shortness of breath.   1.  Pulmonary edema. This is likely secondary to transient atrial fibrillation with rapid ventricular response. Three months ago, the patient was admitted to the hospital for cardiac arrest, after which she went into atrial  fibrillation with rapid ventricular response. At that time, she had some pulmonary edema that was found to be acute systolic heart failure. An echocardiogram was performed after she was stabilized and showed a normal ejection fraction of 50 to 60% with mild-to-moderate mitral valve regurgitation and only mild aortic regurgitation as well as tricuspid regurgitation. Today, the patient's initial EKG had a noisy baseline and showed atrial fibrillation. A repeat EKG showed possible atrial fibrillation, but only J-point elevation with a controlled rate. I suspect that the patient's last admission was a cardiac arrest due to tachyarrhythmia, likely atrial fibrillation with rapid ventricular response, and that today the pulmonary edema occurred due to atrial fibrillation with rapid ventricular response. She is currently feeling remarkably better on bilevel positive airway pressure, and at the time of this dictation, I witnessed the patient being transported to the floor off of noninvasive positive pressure ventilation and breathing very comfortably without hypoxia or increased work of breathing. We will trend the patient's troponins. If there is an increase more than the elevated troponin on her previous admission, we will start a heparin drip, but at this time, I do not believe  that she has had any myocardial injury aside from some minimal demand ischemia.  2.  Atrial fibrillation. Continue amiodarone and metoprolol. The patient is on telemetry.  3.  Hypothyroidism. Continue Synthroid. I will also check a thyroid stimulating hormone.  4.  Hypertension. Continue lisinopril.  5.  Diabetes type 2. The patient has ophthalmic and neurologic complications. We will give her sliding scale insulin while in the hospital.  6.  Hyperlipidemia. Continue statin therapy.  7.  Peripheral vascular disease. Continue aspirin.  8.  Anemia. Continue iron supplementation.  9.  Deep vein thrombosis prophylaxis with subcutaneous heparin  for now.  10.  Gastrointestinal prophylaxis. None.   CODE STATUS:  The patient is a full code.   TIME SPENT ON ADMISSION ORDERS AND CRITICAL PATIENT CARE:  Approximately 45 minutes.    ____________________________ Norva Riffle. Marcille Blanco, MD msd:nb D: 11/28/2013 04:23:59 ET T: 11/28/2013 05:20:34 ET JOB#: [id number]  cc: Norva Riffle. Marcille Blanco, MD, <Dictator> Norva Riffle Lessie Manigo MD ELECTRONICALLY SIGNED 12/01/2013 4:42

## 2014-05-06 NOTE — Discharge Summary (Signed)
PATIENT NAME:  Sharon [name], Sharon [name] MR#:  [id number] DATE OF BIRTH:  1933-01-26  DATE OF ADMISSION:  11/27/2013 DATE OF DISCHARGE:  11/30/2013  DISCHARGE DIAGNOSES:  1. Flash pulmonary edema secondary to atrial fibrillation, with conversion to sinus rhythm.  2. Hypothyroid.  3. Hypertension.  4. Diabetes mellitus.  5. Severe lower extremity neuropathy.  6. Peripheral vascular disease.  7. Anemia.   DISCHARGE MEDICATIONS:  Synthroid 112 mcg daily, vitamin D3 1000 units daily, iron sulfate 325 mg daily, aspirin 81 mg daily, Simvastatin 40 mg at bedtime, docusate b.i.d., gabapentin 400 mg b.i.d., glipizide 2.5 mg daily, pantoprazole 40 mg daily, amiodarone 200 mg b.i.d.   REASON FOR ADMISSION: A 81 year old female presents with pulmonary edema from  rapid atrial fibrillation. Please see H and P for HPI, past medical history and physical exam.   HOSPITAL COURSE: The patient was admitted, ruled out for MI. Her enzymes were slightly up thought due to demand ischemia. She was diuresed with IV Lasix. Due to hypotension, her amiodarone dose was increased. She went back to normal sinus rhythm as 200 mg amiodarone plus metoprolol was insufficient, to keep her in sinus rhythm. She remained in sinus rhythm for 48 hours prior to discharge. Due to hypotension, she will be off metoprolol and off lisinopril. Ejection fraction normal by echocardiography. She will go home with home health. Follow up with Dr. Sabra Heck in one week.   ____________________________ Rusty Aus, MD mfm:kl D: 11/30/2013 07:44:00 ET T: 11/30/2013 12:54:38 ET JOB#: [id number] Naida Escalante F Montine Hight MD ELECTRONICALLY SIGNED 12/01/2013 8:18

## 2014-05-06 NOTE — Discharge Summary (Signed)
PATIENT NAME:  Sharon [name], Sharon [name] MR#:  [id number] DATE OF BIRTH:  06-24-1933  DATE OF ADMISSION:  12/11/2013 DATE OF DISCHARGE:  12/14/2013  DISCHARGE DIAGNOSES: 1.  Acute diastolic congestive heart failure.  2.  Chronic atrial fibrillation.  3.  Hypothyroidism.  4.  Hypertension.  5.  Diabetes mellitus, non-insulin-requiring.  6.  Bilateral foot cellulitis.  7.  Severe lower extremity neuropathy.  8.  Peripheral vascular disease.  9.  Chronic anemia.   DISCHARGE MEDICATIONS: 1.  PreserVision 1 tab b.i.d.  2.  Synthroid 112 mcg daily.  3.  Vitamin D3 1000 units daily. 4.  Ferrous sulfate 325 mg daily. 5.  Aspirin 81 mg daily.  6.  Simvastatin 40 mg at bedtime. 7.  Gabapentin 400 mg b.i.d. 8.  Glipizide 2.5 mg daily. 9.  Pantoprazole 40 mg daily.  10.  Amiodarone 200 mg b.i.d. 11.  Torsemide 10 mg daily.  12.  Potassium chloride 8 mEq daily. 13.  Doxycycline 100 mg b.i.d. x 1 week.   REASON FOR ADMISSION: A 81 year old female who presents with dyspnea.  Please see H and P for HPI, past medical history, and physical exam.   HOSPITAL COURSE: The patient was admitted. Cardiac enzymes were normal, EF greater than 55%. It was thought that this is due to dietary indiscretion from salt. Torsemide was added.  After IV Lasix, she diuresed well, came off oxygen. Her feet had a bilateral rash likely from topical, but could be cellulitis and was given Rocephin and IV x 1 and then doxycycline added. Follow up with Dr. Sabra Heck in 1 week.   OVERALL PROGNOSIS: Guarded.   DISPOSITION: She will be going home with home health.    ____________________________ Rusty Aus, MD mfm:DT D: 12/14/2013 07:12:54 ET T: 12/14/2013 14:14:00 ET JOB#: [id number]  cc: Rusty Aus, MD, <Dictator> MARK Roselee Culver MD ELECTRONICALLY SIGNED 12/16/2013 8:14

## 2014-05-06 NOTE — Consult Note (Signed)
Present Illness 81 year old female with history of cardiac arrest several months ago which was associated with atrial fibrillation with rapid ventricular response who was now readmitted with rather acute onset of shortness of breath noted to be in atrial fibrillation with rapid ventricular response with evidence of lead least moderate pulmonary edema on chest x-ray.  Patient had an echocardiogram during her previous admission showing preserved left ventricular function.  She had been placed on amiodarone a was on amiodarone at 200 mg daily prior to admission.  On this admission she was compliant with her medications and noted to be in rapid atrial fibrillation.  She had pulmonary edema on her chest x-ray.  The she had mild serum troponin elevation.  She currently has converted to sinus rhythm and is resting comfortably.  Her chads score is 4 for a age, hypertension, diabetes and congestive heart failure.  She appears to have diastolic congestive heart failure.  She appears to have chronic diastolic failure with an acute exacerbation.  This exacerbation was likely secondary to her atrial fibrillation with rapid ventricular response. she was placed on the Levophed to maintain blood pressure due to relative hypotension.   Physical Exam:  GEN well developed, well nourished, no acute distress   HEENT PERRL, hearing intact to voice   NECK supple   RESP clear BS   CARD Regular rate and rhythm  Murmur   Murmur Systolic   Systolic Murmur axilla   ABD denies tenderness  normal BS   LYMPH negative neck, negative axillae   EXTR negative cyanosis/clubbing, negative edema   SKIN normal to palpation   NEURO cranial nerves intact, motor/sensory function intact   PSYCH A+O to time, place, person   Review of Systems:  Subjective/Chief Complaint Shortness of breath initially now resting comfortably with some foot pain   General: No Complaints   Skin: No Complaints   ENT: No Complaints   Eyes:  No Complaints   Neck: No Complaints   Respiratory: Short of breath   Cardiovascular: Dyspnea   Gastrointestinal: No Complaints   Genitourinary: No Complaints   Vascular: No Complaints   Musculoskeletal: foot pain   Neurologic: No Complaints   Hematologic: No Complaints   Endocrine: No Complaints   Psychiatric: No Complaints   Review of Systems: All other systems were reviewed and found to be negative   Medications/Allergies Reviewed Medications/Allergies reviewed   EKG:  Abnormal NSSTTW changes   Interpretation Initial EKG revealed atrial fibrillation with rapid ventricular response.  Had nonspecific ST T wave changes.  Currently in normal sinus rhythm.    Codeine: Other  Cymbalta: Other   Impression 81 year old female with sudden onset of shortness of breath and pulmonary edema with atrial fibrillation with rapid ventricular response.  Her symptoms are likely secondary to her heart rate.  She has a history of diastolic heart failure within normal left ventricular function.  Will continue with amiodarone however will increase the drug to  200 mg twice daily and hold on her metoprolol due to blood pressure.  Will do this in hopes of attempting to maintain sinus rhythm.  Will care pre diurese and wean off pressors as tolerated.  Will need to discuss chronic anticoagulation issues prior to discharge.  Patient has a chads score of 5 with a chads Vasc score of 6. no obvious bleeding risk.   Plan 1. Continue with current meds with exception of will discontinue metoprolol and place on amiodarone 400 mg twice daily 2. Attempt to wean Levophed keeping  MA P greater than 60 is 65 3. discontinue metoprolol 4. Hold lisinopril from now 5. will consider apixaban prior to discharge.  This dose would be 5 mg twice daily   Electronic Signatures: Teodoro Spray (MD)  (Signed [phone] 14:12)  Authored: General Aspect/Present Illness, History and Physical Exam, Review of System, EKG ,  Allergies, Impression/Plan   Last Updated: 16-Nov-15 14:12 by Teodoro Spray (MD)

## 2014-05-06 NOTE — H&P (Signed)
PATIENT NAME:  Sharon [name], Sharon [name] MR#:  [id number] DATE OF BIRTH:  15-Jan-1933  DATE OF ADMISSION:  08/08/2013  REFERRING PHYSICIAN: Dr. Lisa Roca.   PRIMARY CARE PHYSICIAN: Dr. Emily Filbert.   CHIEF COMPLAINT: Shortness of breath, palpitation.   HISTORY OF PRESENT ILLNESS: This is a 81 year old female with known history of hypertension, diabetes, neuropathy, presents with complaints of palpitation and shortness of breath. Reports has been going on for a couple of days. Denies any chest pain, any cough, any productive sputum, any fever, any chills. Upon presentation, the patient was found to be tachycardic with Afib and RVR. Heart rate in the 150s to 160s. The patient was started on Cardizem drip in the Emergency Room after giving her IV Cardizem push x 1 did not help with her heart rate. As well, the patient reporting shortness of breath. She was hypoxic, requiring oxygen in the Emergency Room. Her chest x-ray did show extensive acute bilateral pulmonary infiltrates, most likely representing acute pulmonary edema. The patient denies cough, productive sputum. The patient reports some mild lower extremity edema as well. The patient's BNP was elevated at 1266. She does not have a history of congestive heart failure. Has not been seen by cardiology in the past. Hospitalist requested to admit the patient.   PAST MEDICAL HISTORY:  1. Hypertension.  2. Diabetes.  3. Neuropathy.  4. Macular degeneration.  5. Peripheral vascular disease.  6. Hypothyroidism.   PAST SURGICAL HISTORY:  1. Left hip repair.  2. Vascular surgery of the left leg.  3. Appendectomy.  4. Cholecystectomy.   ALLERGIES: No known drug allergies.   HOME MEDICATIONS:  1. Tylenol as needed.  2. Gabapentin 600 mg oral 3 times a day.  3. Glyburide/metformin 5/500 two tablets 2 times a day.  4. Simvastatin 40 mg oral daily.  5. Metoprolol tartrate 50 mg oral 2 times a day.  6. Levothyroxine 112 mcg oral daily.  7.  Docusate/Senna 1 tablet 2 times a day.  8. Ferrous sulfate 325 mg oral daily.  10. PreserVision 1 tablet daily.  11. Vitamin D3 1000 international units oral daily.   SOCIAL HISTORY: No smoking. No alcohol. No drug use. Lives alone.   FAMILY HISTORY: Father died at 46 of old age, had hypertension. Mother died at the age of 21. She had an MI.    REVIEW OF SYSTEMS:  CONSTITUTIONAL: The patient denies fever, chills. Reports fatigue, weakness.  EYES: Denies blurry vision, double vision or inflammation. Has macular degeneration. Wears glasses.  ENT: Denies runny nose, epistaxis, ear pain, hearing loss. Has macular degeneration.  RESPIRATORY: Denies cough, hemoptysis, wheezing, productive sputum. Reports shortness of breath.  CARDIOVASCULAR: Denies chest pain, orthopnea, syncope. Had palpitation.  GASTROINTESTINAL: Denies nausea, vomiting, diarrhea, hematemesis.  GENITOURINARY: Denies dysuria, hematuria or renal colic.  ENDOCRINE: Denies polyuria, polydipsia, heat or cold intolerance.  HEMATOLOGY: Denies anemia, easy bruising, bleeding diathesis.  INTEGUMENTARY: Denies acne, rash or skin lesion.  MUSCULOSKELETAL: Denies any gout, swelling, cramps.  NEUROLOGIC: Denies CVA, TIA, tremors, vertigo, ataxia.  PSYCHIATRIC: Denies anxiety, insomnia or depression.   PHYSICAL EXAMINATION:  VITAL SIGNS: Temperature 96.7, pulse 127, respiratory rate 28, blood pressure 163/77, saturating 100% on oxygen.  GENERAL: Well-nourished female, looks comfortable in bed, in no apparent distress.  HEENT: Head atraumatic, normocephalic. Pupils equal, reactive to light. Pink conjunctivae. Anicteric sclerae. Moist oral mucosa.  NECK: Supple. No thyromegaly. No JVD.  CHEST: Good air entry bilaterally. No wheezing, rales. Has bibasilar crackles.  CARDIOVASCULAR: S1, S2 heard.  No rubs, murmurs, gallops. Irregularly irregular, tachycardic.  ABDOMEN: Soft, nontender, nondistended. Bowel sounds present.  EXTREMITIES: No  edema. No clubbing. No cyanosis.  PSYCHIATRIC: Appropriate affect. Awake, alert x 3. Intact judgment and insight.  NEUROLOGIC: Cranial nerves grossly intact. Motor 5 out of 5. No focal deficits.  MUSCULOSKELETAL: No joint effusion or erythema.   PERTINENT LABORATORIES: BNP 1266. Glucose 268, BUN 9, creatinine 1.1, sodium 130, potassium 3.6, chloride 98, CO2 23. ALT 17, AST 31, alk phos 101. Troponin less than 0.02. White blood cells 11.2, hemoglobin 12.5, hematocrit 40.5, platelets 331.   Chest x-ray, portable, showing extensive acute bilateral pulmonary infiltrates, most likely representing acute pulmonary edema.   ASSESSMENT AND PLAN:  1. New onset atrial fibrillation: The patient will be started on Cardizem drip. Will increase her metoprolol to 50 every 6 hours. Consult cardiology service. Will admit her to CCU. Will check 2-D echocardiogram as well.  2. Congestive heart failure: Appears to be new onset, most likely exacerbated by her atrial fibrillation with rapid ventricular response. Will aim for heart rate control. Will check echocardiogram. Will consult cardiology. Will start her on Lasix 20 mg intravenous b.i.d. Will continue to cycle her cardiac enzymes.  3. Restless leg syndrome: The patient will be continued on gabapentin. Will start her on pramipexole.   4. Hypertension: Blood pressure is mildly elevated. Will resume her back on her metoprolol. Will increase the dose. As well, she is on Cardizem drip.  5. Diabetes mellitus: Will hold her oral hypoglycemic agents. Will add insulin sliding scale.  6. Neuropathy: Continue with gabapentin.  7. Hypothyroidism: Continue with Synthroid.  8. Deep vein thrombosis prophylaxis: Subcutaneous heparin.   CODE STATUS: FULL CODE.   TOTAL TIME SPENT ON ADMISSION AND PATIENT CARE: 55 minutes.   ____________________________ Albertine Patricia, MD dse:gb D: 08/09/2013 01:18:05 ET T: 08/09/2013 01:48:21 ET JOB#: [id number]  cc: Albertine Patricia, MD, <Dictator> Layla Gramm Graciela Husbands MD ELECTRONICALLY SIGNED 08/09/2013 5:20

## 2014-05-06 NOTE — Consult Note (Signed)
Chief Complaint:  Subjective/Chief Complaint Patient feels reasonably well today denies any groin complainsPatient status post catheterization mild shortness of breath denies any significant pain still has some mild cough.  no chest pain no significant palpitations   Brief Assessment:  GEN well developed, well nourished, no acute distress, thin   Cardiac Irregular  murmur present  -- LE edema  -- JVD  --Gallop   Respiratory normal resp effort  rhonchi   Gastrointestinal Normal   Gastrointestinal details normal Soft  Nontender  Nondistended  No masses palpable  Bowel sounds normal   EXTR negative cyanosis/clubbing, negative edema   Lab Results: Routine Chem:  31-Jul-15 04:46   Glucose, Serum  158   Assessment/Plan:  Assessment/Plan:  Assessment IMP  weakness CHF AFIB SOB Cough Renal isuff  anemia  diabetes CAD .   Plan PLAN  continue amiodarone for the AFib and reduce dose to 200 mg once a day  long-term anticoagulation with Eliquis 2.5 twice a day  aspirin 81 mg once a day  I had metoprolol for the 25 mg twice a day for rate control and blood pressure  continue Lasix for congestive heart failure  consider inhalers for bronchitis  continue diabetes management  status post cardiac catheterization with significant coronary disease and right coronary     artery will  normal LV function recommend medical therapy  increase activity  physical therapy/ occupational therapy  recommend medical therapy after discussing case with  [facility]  follow-up cardiology as an outpatient in 1-2 weeks  recommend rehab for for 2-4 weeks prior to return  home   Electronic Signatures: Lujean Amel D (MD)  (Signed 19-Aug-15 14:13)  Authored: Chief Complaint, Brief Assessment, Lab Results, Assessment/Plan   Last Updated: 19-Aug-15 14:13 by Yolonda Kida (MD)

## 2014-05-06 NOTE — Discharge Summary (Signed)
PATIENT NAME:  Sharon [name], Sharon [name] MR#:  [id number] DATE OF BIRTH:  05/07/1933  DATE OF ADMISSION:  08/08/2013 DATE OF DISCHARGE:  08/12/2013  DISCHARGE DIAGNOSES: 1.  Acute atrial fibrillation, rate uncontrolled.  2.  Congestive heart failure, secondary to atrial fibrillation, acute systolic.  3.  Diabetes mellitus.  4.  Hypothyroid.  5.  Chronic leg neuropathy.  6.  Hypertension.  7.  B12 deficiency.  8.  Ataxia.   DISCHARGE MEDICATIONS: 1.  Glyburide/metformin 5/500 two tabs b.i.d.  2.  Gabapentin 600 mg t.i.d.  3.  Synthroid 112 mcg daily. 4.  Ferrous sulfate 325 mg daily. 5.  Simvastatin 40 mg at bedtime.  6.  Docusate b.i.d.  7.  Toprol-XL 100 mg 1/2 a.m., 1 p.m.  8.  Cymbalta 30 mg daily.  9.  Aspirin 81 mg daily.  10.  Demadex 10 mg b.i.d.  11.  Potassium chloride 10 mEq b.i.d.   REASON FOR ADMISSION: A 81 year old female who presents with rapid A-fib and CHF. Please see H and P for HPI, past medical history and physical exam.   HOSPITAL COURSE: The patient was admitted to the ICU. Heart rate controlled with IV Cardizem. She was IV diuresed. Her LV systolic function was normal with mild to moderate MR. With aggressive rate control, her heart rate came down. She has not been a Coumadin or anticoagulation candidate because of multiple falls and will remain on a baby aspirin daily. She came off her oxygen and her heart rate was controlled in the 70 to 80 range, chronic A-fib with additional doses of Toprol-XL. She will basically go back home on her same medications, but on a higher dose of Toprol-XL. She will follow up with Dr. Sabra Heck in 1 week.   ____________________________ Rusty Aus, MD mfm:sb D: 08/12/2013 07:55:15 ET T: 08/12/2013 09:08:55 ET JOB#: [id number]  cc: Rusty Aus, MD, <Dictator> Hiroyuki Ozanich Roselee Culver MD ELECTRONICALLY SIGNED 08/15/2013 6:54

## 2014-05-06 NOTE — Discharge Summary (Signed)
PATIENT NAME:  Sharon, [name] MR#:  [id number] DATE OF BIRTH:  03-11-1933  DATE OF ADMISSION:  12/25/2013 DATE OF DISCHARGE:  12/28/2013  DISCHARGE DIAGNOSES:   1. Acute diastolic congestive heart failure.  2. Hypothyroid.  3. Severe peripheral neuropathy.  4. Diabetes mellitus, non-insulin-requiring.  5. Gastroesophageal reflux disease.  6. History of atrial fibrillation.   DISCHARGE MEDICATIONS: Synthroid 112 mcg daily, glipizide ER 2.5 mg daily, potassium chloride 8 mEq daily, amiodarone 200 mg daily, aspirin 81 mg daily, pantoprazole 40 mg daily. Simvastatin 40 mg at bedtime, vitamin D3 2000 units daily. Gabapentin 600 mg b.i.d., iron 28 mg daily, torsemide 10 mg 2 tabs daily, lisinopril 5 mg daily.   REASON FOR ADMISSION: A 81 year old female presents with CHF. Please see H and P for HPI, past medical history, and physical exam.   HOSPITAL COURSE: The patient was admitted. Enzymes showed demand ischemia. She was IV diuresed with rapid improvement. Despite needing oxygen at home, she did not qualify and she will be tested at home for O2 qualification. Cardiology reviewed her heart cath data from several months ago and thought that re-cath was unnecessary, as she had RCA disease with collateralization from the left. Lisinopril was added. She was talked about at length on a low-salt diet. There is no sign for arrhythmia and in fact, her heart is in normal sinus rhythm. The amiodarone dose will be dropped to one a day. Hopefully, she will qualify for home O2. Overall prognosis is guarded   ____________________________ Rusty Aus, MD mfm:mw D: 12/28/2013 08:01:31 ET T: 12/28/2013 11:38:05 ET JOB#: [id number]  cc: Rusty Aus, MD, <Dictator> MARK Roselee Culver MD ELECTRONICALLY SIGNED 12/29/2013 8:20

## 2014-05-06 NOTE — Consult Note (Signed)
Chief Complaint:  Subjective/Chief Complaint Still intubated planned extubation today.   VITAL SIGNS/ANCILLARY NOTES: **Vital Signs.:   11-Aug-15 14:00  Temperature Temperature (F) 98.2  Celsius 36.7  Temperature Source oral  Pulse Pulse 108  Respirations Respirations 32  Systolic BP Systolic BP 82  Diastolic BP (mmHg) Diastolic BP (mmHg) 72  Mean BP 75  Pulse Ox % Pulse Ox % 97  Pulse Ox Activity Level  At rest  Oxygen Delivery 2L; Nasal Cannula  *Intake and Output.:   Shift 11-Aug-15 15:00  Grand Totals Intake:  279.4 Output:  500    Net:  -220.6 24 Hr.:  -220.6  Enteral feeding ml     In:  0  Enteral Feeding flush intake      In:  0  IV (Primary)      In:  6.6  IV (Primary)      In:  10  IV (Primary)      In:  30  IV (Primary)      In:  132.8  IV (Primary)      In:  0  IV (Secondary)      In:  100  Urine ml     Out:  500  Length of Stay Totals Intake:  17118 Output:  18460    Net:  -1342   Brief Assessment:  GEN well developed, well nourished, no acute distress, thin   Cardiac Regular  murmur present  + JVD  --Gallop   Respiratory normal resp effort  rhonchi   Gastrointestinal Normal   Gastrointestinal details normal Soft  Nontender  Bowel sounds normal   Lab Results: Hepatic:  11-Aug-15 03:03   Bilirubin, Total 0.3  Alkaline Phosphatase 87 (46-116 NOTE: New Reference Range 08/02/13)  SGPT (ALT)  11 (14-63 NOTE: New Reference Range 08/02/13)  SGOT (AST) 19  Total Protein, Serum 6.7  Albumin, Serum  2.0  Lab:  11-Aug-15 10:37   pH (ABG) 7.420 (7.350-7.450 NOTE: New Reference Range 08/06/13)  PCO2 40 (32-48 NOTE: New Reference Range 08/23/13)  PO2 98 (83-108 NOTE: New Reference Range 08/06/13)  FiO2 30  Base Excess 0 (-3-3 NOTE: New Reference Range 08/23/13)  HCO3 25.9 (21.0-28.0 NOTE: New Reference Range 08/06/13)  O2 Saturation 97.0  O2 Device 840  Specimen Site (ABG) LT BRACHIAL  Specimen Type (ABG) ARTERIAL  PSV 10  PEEP 5.0  CPAP  0.5  %FiO2 30.0  Mechanical Rate 000 (Result(s) reported on 23 Aug 2013 at 10:51AM.)  Routine Chem:  11-Aug-15 03:03   Magnesium, Serum 2.4 (1.8-2.4 THERAPEUTIC RANGE: 4-7 mg/dL TOXIC: > 10 mg/dL  -----------------------)  Phosphorus, Serum 2.7 (Result(s) reported on 23 Aug 2013 at 03:38AM.)  Glucose, Serum  176  BUN  26  Creatinine (comp)  1.32  Sodium, Serum  135  Potassium, Serum  3.3  Chloride, Serum  96  CO2, Serum 26  Calcium (Total), Serum  8.2  Anion Gap 13  Osmolality (calc) 279  eGFR (African American)  45  eGFR (Non-African American)  39 (eGFR values <58mL/min/1.73 m2 may be an indication of chronic kidney disease (CKD). Calculated eGFR is useful in patients with stable renal function. The eGFR calculation will not be reliable in acutely ill patients when serum creatinine is changing rapidly. It is not useful in  patients on dialysis. The eGFR calculation may not be applicable to patients at the low and high extremes of body sizes, pregnant women, and vegetarians.)    13:47   Potassium, Serum 4.6 (Result(s) reported on  23 Aug 2013 at 02:10PM.)  12-Aug-15 03:03   Magnesium, Serum  2.5 (1.8-2.4 THERAPEUTIC RANGE: 4-7 mg/dL TOXIC: > 10 mg/dL  -----------------------)  Phosphorus, Serum 2.5 (Result(s) reported on 24 Aug 2013 at 03:29AM.)  Glucose, Serum  201  BUN  25  Creatinine (comp) 1.10  Sodium, Serum 140  Potassium, Serum 4.2  Chloride, Serum 103  CO2, Serum 28  Calcium (Total), Serum 8.8  Anion Gap 9  Osmolality (calc) 289  eGFR (African American)  56  eGFR (Non-African American)  48 (eGFR values <10mL/min/1.73 m2 may be an indication of chronic kidney disease (CKD). Calculated eGFR is useful in patients with stable renal function. The eGFR calculation will not be reliable in acutely ill patients when serum creatinine is changing rapidly. It is not useful in  patients on dialysis. The eGFR calculation may not be applicable to patients at the low and  high extremes of body sizes, pregnant women, and vegetarians.)  Routine Hem:  11-Aug-15 03:03   WBC (CBC) 9.7  RBC (CBC)  3.14  Hemoglobin (CBC)  7.8  Hematocrit (CBC)  24.7  Platelet Count (CBC) 304  MCV  79  MCH  24.8  MCHC  31.5  RDW  16.4  Neutrophil % 71.8  Lymphocyte % 11.8  Monocyte % 13.5  Eosinophil % 2.1  Basophil % 0.8  Neutrophil #  7.0  Lymphocyte # 1.1  Monocyte #  1.3  Eosinophil # 0.2  Basophil # 0.1 (Result(s) reported on 23 Aug 2013 at 03:30AM.)  12-Aug-15 03:03   WBC (CBC) 10.2  RBC (CBC)  3.50  Hemoglobin (CBC)  8.8  Hematocrit (CBC)  27.3  Platelet Count (CBC) 364  MCV  78  MCH  25.1  MCHC 32.1  RDW  16.5  Neutrophil % 66.6  Lymphocyte % 16.7  Monocyte % 12.4  Eosinophil % 3.2  Basophil % 1.1  Neutrophil #  6.8  Lymphocyte # 1.7  Monocyte #  1.3  Eosinophil # 0.3  Basophil # 0.1 (Result(s) reported on 24 Aug 2013 at 03:52AM.)   Radiology Results: XRay:    06-Aug-15 01:51, Chest Portable Single View  Chest Portable Single View   REASON FOR EXAM:    intubation  COMMENTS:       PROCEDURE: DXR - DXR PORTABLE CHEST SINGLE VIEW  - Aug 18 2013  1:51AM     CLINICAL DATA:  Intubation.    EXAM:  PORTABLE CHEST - 1 VIEW    COMPARISON:  08/08/2013    FINDINGS:  Right mainstem intubation. Retraction by 4 cm would place the tip  between the clavicular heads and carina. The orogastric tube crosses  the diaphragm.    Normal heart size and upper mediastinal contours. Diffuse  interstitial coarsening with [employer]. No effusion or  pneumothorax. Symmetric biapical thickening.    These results were called by telephone at the time of interpretation  on 08/18/2013 at 1:55 am to [facility], who verbally acknowledged  these results.     IMPRESSION:  1. Right mainstem intubation. Recommend endotracheal tube retraction  by 4 cm.  2. Orogastric tube in good position.  3. Pulmonary edema.  Electronically Signed    By: Jorje Guild M.D.     On: 08/18/2013 01:56         Verified By: Gilford Silvius, M.D.,    06-Aug-15 04:54, Chest Portable Single View  Chest Portable Single View   REASON FOR EXAM:    LINE PLACEMENT  COMMENTS:  PROCEDURE: DXR - DXR PORTABLE CHEST SINGLE VIEW  - Aug 18 2013  4:54AM     CLINICAL DATA:  Central line placement    EXAM:  PORTABLE CHEST - 1 VIEW    COMPARISON:  Chest x-ray from the same day at 1:44 a.m.    FINDINGS:  Improved positioning of the endotracheal tube, now at the level of  the clavicular heads. The orogastric tube reaches the stomach at  least. New left IJ catheter, tip at the level of the distal SVC.    Unchanged heart size and upper mediastinal contours. New diffuse  perihilar airspace disease. Interstitial opacities persist. There  may be layering pleural fluid. No pneumothorax.     IMPRESSION:  1. New left IJ catheter is in good position.  No pneumothorax.  2. Endotrachealtube is in good position after shortening.  3. Worsening pulmonary edema.      Electronically Signed    By: Tiburcio Pea M.D.    On: 08/18/2013 04:58     Verified By: Audry Riles, M.D.,    08-Aug-15 10:05, Chest Portable Single View  Chest Portable Single View   REASON FOR EXAM:    pt being taken off vent  COMMENTS:       PROCEDURE: DXR - DXR PORTABLE CHEST SINGLE VIEW  - Aug 20 2013 10:05AM     CLINICAL DATA:  Respiratory failure.    EXAM:  PORTABLE CHEST - 1 VIEW    COMPARISON:  08/18/2013    FINDINGS:  Endotracheal tube tip is approximately 4 cm above the carina. Left  jugular central line shows stable positioning. Pulmonary edema  improved. No pneumothorax or significant pleural fluid. The heart  size is stable.     IMPRESSION:  Improving pulmonary edema.      Electronically Signed    By: Irish Lack M.D.    On: 08/20/2013 10:47         Verified By: Reola Calkins, M.D.,    09-Aug-15 14:38, Chest Portable Single View  Chest Portable Single View    REASON FOR EXAM:    ventilator  COMMENTS:       PROCEDURE: DXR - DXR PORTABLE CHEST SINGLE VIEW  - Aug 21 2013  2:38PM     CLINICAL DATA:  Respiratory failure, possible extubation    EXAM:  PORTABLE CHEST - 1 VIEW    COMPARISON:  08/20/2013    FINDINGS:  Endotracheal tube terminates 4.5 cm above the carina.  Multifocal patchy opacities, possibly reflecting multifocal  pneumonia versus asymmetric pulmonary edema, right upper lobe  predominant. Possible small pleural effusions. No pneumothorax.    Overall appearance is mildly improved.    The heart is normal in size.    Left IJ venous catheter terminating in the lower SVC.    Enteric tube terminates in the stomach.     IMPRESSION:  Endotracheal tube terminates 4.5 cm above the carina.  Multifocal patchy opacities, possibly reflecting multifocal  pneumonia or asymmetric pulmonary edema, right upper lobe  predominant. Possible small pleural effusions.    Overall appearance is mildly improved.      Electronically Signed    By: Charline Bills M.D.    On: 08/21/2013 14:48         Verified By: Charline Bills, M.D.,    10-Aug-15 06:39, Chest Portable Single View  Chest Portable Single View   REASON FOR EXAM:    assess for possible ventilator weaning trial  COMMENTS:  PROCEDURE: DXR - DXR PORTABLE CHEST SINGLE VIEW  - Aug 22 2013  6:39AM     CLINICAL DATA:  Assess for possible ventilator weaning.    EXAM:  PORTABLE CHEST - 1 VIEW    COMPARISON:  08/21/2013.    FINDINGS:  Endotracheal tube terminates 2.3 cm above the carina. Nasogastric  tube is followed into the stomach with the tip projecting beyond the  inferior margin of the image. Left IJ central line tip projects over  the SVC. Heart size stable. Right hilum is prominent. Mild diffuse  interstitial prominence and indistinctness persist. Biapical pleural  thickening. Possible tiny bilateral effusions.     IMPRESSION:  1. Mild interstitial  prominence and indistinctness, likely  reflecting edema, stable.  2. Right hilar prominence. Mass or adenopathy cannot be excluded.  Continued attention on followup exams is warranted.  3. Probable tiny bilateral effusions.      Electronically Signed    By: Lorin Picket M.D.    On: 08/22/2013 08:12         Verified By: Luretha Rued, M.D.,  Cardiology:    06-Aug-15 01:51, ED ECG  Ventricular Rate 93  Atrial Rate 76  QRS Duration 112  QT 394  QTc 489  R Axis 106  T Axis 64  ECG interpretation   Atrial fibrillation with premature ventricular or aberrantly conducted complexes  Low voltage QRS  Right bundle branch block  Septal infarct , age undetermined  Abnormal ECG  When compared with ECG of 08-Aug-2013 22:04,  Vent. rate has decreased BY  51 BPM  Right bundle branch block is now Present  ----------unconfirmed----------  Confirmed by OVERREAD, NOT ([id number]), editor PEARSON, BARBARA ([id number]) on 08/18/2013 8:57:18 AM  ED ECG     06-Aug-15 02:18, ECG  Ventricular Rate 121  Atrial Rate 121  P-R Interval 166  QRS Duration 68  QT 296  QTc 420  P Axis 75  R Axis 50  T Axis 57  ECG interpretation   Sinus tachycardia with Premature supraventricular complexes and Fusion complexes  Low voltage QRS  Cannot rule out Anteroseptal infarct (cited on or before 18-Aug-2013)  Abnormal ECG  When compared with ECG of 18-Aug-2013 01:51,  Sinus rhythm has replaced Atrial fibrillation  Right bundle branch block is no longer Present  Questionable change in initial forces of Anterior leads  ----------unconfirmed----------  Confirmed by OVERREAD, NOT ([id number]), editor PEARSON, BARBARA ([id number]) on 08/18/2013 8:57:53 AM  ECG     06-Aug-15 04:38, ECG  Ventricular Rate 106  Atrial Rate 0  QRS Duration 78  QT 382  QTc 507  R Axis 4  T Axis 68  ECG interpretation   Atrial fibrillation with rapid ventricular response with premature ventricular or aberrantly conducted complexes  Low voltage  QRS  Septal infarct (cited on or before 18-Aug-2013)  Abnormal ECG  When compared with ECG of 18-Aug-2013 02:18,  Atrial fibrillation has replaced Sinus rhythm  Questionable change in initial forces of Anterior leads  ----------unconfirmed----------  Confirmed by OVERREAD, NOT ([id number]), editor PEARSON, BARBARA ([id number]) on 08/18/2013 8:58:18 AM  ECG    Assessment/Plan:  Assessment/Plan:  Assessment IMP   Respiratory failure  Congestive Heart Failure  renal insufficiency  altered mental status  diabetes  atrial fibrillation .   Plan PLAN  plan extubation today   continue inhalers  I agree with antibiotic therapy as necessary  IV diuretic therapy  continuing blood sugar control  I agree with amiodarone for atrial fibrillation  consider  long-term anticoagulation  recommend cardiac catheterization in 24-48 hr once she is extubated   Electronic Signatures: Lujean Amel D (MD)  (Signed 12-Aug-15 20:30)  Authored: Chief Complaint, VITAL SIGNS/ANCILLARY NOTES, Brief Assessment, Lab Results, Radiology Results, Assessment/Plan   Last Updated: 12-Aug-15 20:30 by Lujean Amel D (MD)

## 2014-05-06 NOTE — Consult Note (Signed)
Chief Complaint:  Subjective/Chief Complaint Patient status post catheterization mild shortness of breath denies any significant pain still has some mild cough.   VITAL SIGNS/ANCILLARY NOTES: **Vital Signs.:   13-Aug-15 07:23  Vital Signs Type Routine  Temperature Temperature (F) 98  Celsius 36.6  Pulse Pulse 92  Respirations Respirations 30  Systolic BP Systolic BP 161  Diastolic BP (mmHg) Diastolic BP (mmHg) 74  Mean BP 109  Pulse Ox % Pulse Ox % 94  Oxygen Delivery 2L; Nasal Cannula  *Intake and Output.:   Daily 13-Aug-15 07:00  Grand Totals Intake:  1800 Output:  1900    Net:  -100 24 Hr.:  -100  IV (Primary)      In:  1800  Urine ml     Out:  1900  Length of Stay Totals Intake:  19225.2 Output:  21860    Net:  -2634.8   Brief Assessment:  GEN well developed, well nourished, no acute distress, thin   Cardiac Irregular  murmur present  -- LE edema  -- JVD  --Gallop   Respiratory normal resp effort  rhonchi   Gastrointestinal Normal   Gastrointestinal details normal Soft  Nontender  Nondistended  No masses palpable  Bowel sounds normal   EXTR negative cyanosis/clubbing, negative edema   Lab Results: Cardiac Catherization:  13-Aug-15 12:49   Cardiac Catheterization  [facility] [place], [place] [address] [phone]   Cardiovascular Catheterization Comprehensive Report   Patient: Sharon [name] Study date: 08/25/2013 MR number: [id number] Account number: [id number]   DOB: Dec 18, 1933 Age: 81 years Gender: Female Race: White Height: 65 in Weight: 141.7 lb   Diagnostic Cardiologist:  Lujean Amel, MD   SUMMARY:   -SPLANCHNIC AND RENAL VESSELS: Left renal: There was a 50 % stenosis.   -Summary: Normal LVF Normal Wall Motion EF>75% Lmain OK LAD 50% mid Circ 50% mid RCA 100% chronic total with collaterals LAD->RCA Renals left 50%, 25%ostial right Plan Medical therapy for diastolic heart failure/AFIB   CORONARY  CIRCULATION: The coronary circulation is right dominant. Mid LAD: There was a 40 % stenosis. Proximal RCA: There was a diffuse 100 % stenosis. Mid RCA: There was a diffuse 70 % stenosis. Right posterolateral segment: The distal vessel was supplied by extensive collaterals from the LAD.   ABDOMINAL VESSELS: Left renal: There was a 50 % stenosis. Right renal: There was a 25 % stenosis.   VENTRICLES: There were no left ventricular global or regional wall motion abnormalities. The left ventricle was normal in size.   VALVES: AORTIC VALVE: The aortic valve was evaluated by left ventriculography. The aortic valve appeared to be structurally normal. The aortic valve leaflets exhibited normal thickness and normal excursion. There was no aortic stenosis. MITRAL VALVE: The mitral valve was evaluated by left ventriculography. The mitral valve appeared grossly normal. The mitral leaflets exhibited normal thickness and normal excursion. The mitral valve exhibited no regurgitation.   HISTORY: No history of previous myocardial infarction. The patient has hypertension, oral hypoglycemic-treated diabetes, and a family history of coronary artery disease. There was no history of cardiogenic shock, cerebrovascular disease, chronic lung disease, cardiomyopathy, or cardiac arrest. PRIOR CARDIOVASCULAR PROCEDURES: No history of valve surgery, coronary or graft percutaneous intervention, or coronary bypass surgery.   PRIOR DIAGNOSTIC TEST RESULTS: No prior stress test is available.   PROCEDURES PERFORMED: Left heart catheterization with ventriculography. Bilateral renal angiography. Procedure: Successful Closure with Mynx   COMPLICATIONS: No complication occurred during the cath lab visit.  PROCEDURE: The risks and alternatives of the procedures and conscious sedation were explained to the patient and informed consent was obtained. The patient was brought to the cath lab and placed on the table. The  planned puncture sites were prepped and draped in the usual sterile fashion.   -Left heart catheterization. A catheter was advanced to the ascending aorta. Ventriculography was performed using power injection of contrast agent.   -Bilateral renal angiography. A catheter was positioned.   -Successful Closure with Mynx.   PROCEDURE COMPLETION: TIMING: Test started at 12:57. Test concluded at 13:27. RADIATION EXPOSURE: Fluoroscopy time: 7.9 min. Fluoroscopy dose: 1.251 Gray. MEDICATIONS GIVEN: Lasix (Furosemide), 40 mg, IV, at 13:20. CONTRAST GIVEN: 300 ml Maximum Allowable Contrast Dose. Isovue 150 ml.   Prepared and signed by   Lujean Amel, MD Signed 08/25/2013 13:56:54   STUDY DIAGRAM   Angiographic findings Native coronary lesions:  Mid LAD: Lesion 1: 40 % stenosis.  Proximal RCA: Lesion 1: diffuse, 100 % stenosis.  Mid RCA: Lesion 1: diffuse, 70 % stenosis.   HEMODYNAMIC TABLES   Pressures:  Baseline Pressures:  - HR: 89 Pressures:  - Rhythm: Pressures:  -- Aortic Pressure (S/D/M): 169/73/106 Pressures:  -- Left Ventricle (s/edp): 188/9/--   Outputs:  Baseline Outputs:  -- CALCULATIONS[age] Outputs:  -- CALCULATIONS: Body Surface Area: 1.71 Outputs:  -- CALCULATIONS: Height in cm: 165.00 Outputs:  -- CALCULATIONS: Sex: Female Outputs:  -- CALCULATIONS: Weight in kg: 64.40  Lab:  13-Aug-15 10:25   pH (ABG) 7.42 (7.350-7.450 NOTE: New Reference Range 08/06/13)  PCO2 37 (32-48 NOTE: New Reference Range 08/23/13)  PO2  80 (83-108 NOTE: New Reference Range 08/06/13)  FiO2 28  Base Excess -0.2 (-3-3 NOTE: New Reference Range 08/23/13)  HCO3 24.0 (21.0-28.0 NOTE: New Reference Range 08/06/13)  O2 Saturation 97.4  Specimen Site (ABG) RT BRACHIAL  Patient Temp (ABG) 37.0 (Result(s) reported on 25 Aug 2013 at 10:30AM.)  Routine Chem:  13-Aug-15 04:13   Glucose, Serum  229  BUN  25  Creatinine (comp) 1.03  Sodium, Serum 138  Potassium,  Serum 4.0  Chloride, Serum 104  CO2, Serum 24  Calcium (Total), Serum 8.6  Anion Gap 10  Osmolality (calc) 287  eGFR (African American) >60  eGFR (Non-African American)  52 (eGFR values <37mL/min/1.73 m2 may be an indication of chronic kidney disease (CKD). Calculated eGFR is useful in patients with stable renal function. The eGFR calculation will not be reliable in acutely ill patients when serum creatinine is changing rapidly. It is not useful in  patients on dialysis. The eGFR calculation may not be applicable to patients at the low and high extremes of body sizes, pregnant women, and vegetarians.)  Result Comment labs - This specimen was collected through an   - indwelling catheter or arterial line.  - A minimum of 7mls of blood was wasted prior    - to collecting the sample.  Interpret  - results with caution.  Result(s) reported on 25 Aug 2013 at 04:35AM.  Routine Hem:  13-Aug-15 04:13   WBC (CBC)  12.5  RBC (CBC)  3.57  Hemoglobin (CBC)  8.9  Hematocrit (CBC)  28.2  Platelet Count (CBC) 408 (Result(s) reported on 25 Aug 2013 at 05:25AM.)  MCV  79  MCH  25.1  MCHC  31.7  RDW  16.8  Segmented Neutrophils 60  Lymphocytes 21  Monocytes 11  Eosinophil 3  Basophil 2  Metamyelocyte 1  Myelocyte 2  Diff  Comment 1 POIKILOCYTOSIS  Diff Comment 2 POLYCHROMASIA  Diff Comment 3 MICROCYTES PRESENT  Diff Comment 4 PLTS VARIED IN SIZE  Result(s) reported on 25 Aug 2013 at 05:25AM.   Radiology Results: XRay:    06-Aug-15 01:51, Chest Portable Single View  Chest Portable Single View   REASON FOR EXAM:    intubation  COMMENTS:       PROCEDURE: DXR - DXR PORTABLE CHEST SINGLE VIEW  - Aug 18 2013  1:51AM     CLINICAL DATA:  Intubation.    EXAM:  PORTABLE CHEST - 1 VIEW    COMPARISON:  08/08/2013    FINDINGS:  Right mainstem intubation. Retraction by 4 cm would place the tip  between the clavicular heads and carina. The orogastric tube crosses  the  diaphragm.    Normal heart size and upper mediastinal contours. Diffuse  interstitial coarsening with [employer]. No effusion or  pneumothorax. Symmetric biapical thickening.    These results were called by telephone at the time of interpretation  on 08/18/2013 at 1:55 am to [facility], who verbally acknowledged  these results.     IMPRESSION:  1. Right mainstem intubation. Recommend endotracheal tube retraction  by 4 cm.  2. Orogastric tube in good position.  3. Pulmonary edema.  Electronically Signed    By: Jorje Guild M.D.    On: 08/18/2013 01:56         Verified By: Gilford Silvius, M.D.,    06-Aug-15 04:54, Chest Portable Single View  Chest Portable Single View   REASON FOR EXAM:    LINE PLACEMENT  COMMENTS:       PROCEDURE: DXR - DXR PORTABLE CHEST SINGLE VIEW  - Aug 18 2013  4:54AM     CLINICAL DATA:  Central line placement    EXAM:  PORTABLE CHEST - 1 VIEW    COMPARISON:  Chest x-ray from the same day at 1:44 a.m.    FINDINGS:  Improved positioning of the endotracheal tube, now at the level of  the clavicular heads. The orogastric tube reaches the stomach at  least. New left IJ catheter, tip at the level of the distal SVC.    Unchanged heart size and upper mediastinal contours. New diffuse  perihilar airspace disease. Interstitial opacities persist. There  may be layering pleural fluid. No pneumothorax.     IMPRESSION:  1. New left IJ catheter is in good position.  No pneumothorax.  2. Endotrachealtube is in good position after shortening.  3. Worsening pulmonary edema.      Electronically Signed    By: Jorje Guild M.D.    On: 08/18/2013 04:58     Verified By: Gilford Silvius, M.D.,    08-Aug-15 10:05, Chest Portable Single View  Chest Portable Single View   REASON FOR EXAM:    pt being taken off vent  COMMENTS:       PROCEDURE: DXR - DXR PORTABLE CHEST SINGLE VIEW  - Aug 20 2013 10:05AM     CLINICAL DATA:  Respiratory  failure.    EXAM:  PORTABLE CHEST - 1 VIEW    COMPARISON:  08/18/2013    FINDINGS:  Endotracheal tube tip is approximately 4 cm above the carina. Left  jugular central line shows stable positioning. Pulmonary edema  improved. No pneumothorax or significant pleural fluid. The heart  size is stable.     IMPRESSION:  Improving pulmonary edema.      Electronically Signed  By: Aletta Edouard M.D.    On: 08/20/2013 10:47         Verified By: Azzie Roup, M.D.,    09-Aug-15 14:38, Chest Portable Single View  Chest Portable Single View   REASON FOR EXAM:    ventilator  COMMENTS:       PROCEDURE: DXR - DXR PORTABLE CHEST SINGLE VIEW  - Aug 21 2013  2:38PM     CLINICAL DATA:  Respiratory failure, possible extubation    EXAM:  PORTABLE CHEST - 1 VIEW    COMPARISON:  08/20/2013    FINDINGS:  Endotracheal tube terminates 4.5 cm above the carina.  Multifocal patchy opacities, possibly reflecting multifocal  pneumonia versus asymmetric pulmonary edema, right upper lobe  predominant. Possible small pleural effusions. No pneumothorax.    Overall appearance is mildly improved.    The heart is normal in size.    Left IJ venous catheter terminating in the lower SVC.    Enteric tube terminates in the stomach.     IMPRESSION:  Endotracheal tube terminates 4.5 cm above the carina.  Multifocal patchy opacities, possibly reflecting multifocal  pneumonia or asymmetric pulmonary edema, right upper lobe  predominant. Possible small pleural effusions.    Overall appearance is mildly improved.      Electronically Signed    By: Julian Hy M.D.    On: 08/21/2013 14:48         Verified By: Julian Hy, M.D.,    10-Aug-15 06:39, Chest Portable Single View  Chest Portable Single View   REASON FOR EXAM:    assess for possible ventilator weaning trial  COMMENTS:       PROCEDURE: DXR - DXR PORTABLE CHEST SINGLE VIEW  - Aug 22 2013  6:39AM     CLINICAL DATA:   Assess for possible ventilator weaning.    EXAM:  PORTABLE CHEST - 1 VIEW    COMPARISON:  08/21/2013.    FINDINGS:  Endotracheal tube terminates 2.3 cm above the carina. Nasogastric  tube is followed into the stomach with the tip projecting beyond the  inferior margin of the image. Left IJ central line tip projects over  the SVC. Heart size stable. Right hilum is prominent. Mild diffuse  interstitial prominence and indistinctness persist. Biapical pleural  thickening. Possible tiny bilateral effusions.     IMPRESSION:  1. Mild interstitial prominence and indistinctness, likely  reflecting edema, stable.  2. Right hilar prominence. Mass or adenopathy cannot be excluded.  Continued attention on followup exams is warranted.  3. Probable tiny bilateral effusions.      Electronically Signed    By: Lorin Picket M.D.    On: 08/22/2013 08:12         Verified By: Luretha Rued, M.D.,  Cardiac Catherization:    13-Aug-15 12:49, Cardiac Catheterization  Cardiac Catheterization   [facility]  [place], [place] [address]  [phone]     Cardiovascular Catheterization Comprehensive Report     Patient: Sharon [name]  Study date: 08/25/2013  MR number: [id number]  Account number: [id number]     DOB: [birth year]  Age: 83 years  Gender: Female  Race: White  Height: 65 in  Weight: 141.7 lb     Diagnostic Cardiologist:  Lujean Amel, MD     SUMMARY:     -SPLANCHNIC AND RENAL VESSELS: Left renal: There was a 50 % stenosis.     -Summary: Normal LVF  Normal Wall Motion  EF>75%  Lmain OK  LAD 50% mid  Circ 50% mid  RCA 100% chronic total with collaterals LAD->RCA  Renals left 50%, 25%ostial right  Plan  Medical therapy for diastolic heart failure/AFIB     CORONARY CIRCULATION: The coronary circulation is right dominant. Mid  LAD: There was a 40 % stenosis. Proximal RCA: There was a diffuse 100  % stenosis. Mid RCA: There was a diffuse 70  % stenosis. Right  posterolateral segment: The distal vessel was supplied by extensive  collaterals from the LAD.     ABDOMINAL VESSELS: Left renal: There was a 50 % stenosis. Right renal:  There was a 25 % stenosis.     VENTRICLES: There were no left ventricular global or regional wall  motion abnormalities. The left ventricle was normal in size.     VALVES: AORTIC VALVE: The aortic valve was evaluated by left  ventriculography. The aortic valve appeared to be structurally  normal. The aortic valve leaflets exhibited normal thickness and  normal excursion. There was no aortic stenosis. MITRAL VALVE: The  mitral valve was evaluated by left ventriculography. The mitral valve  appeared grossly normal. The mitral leaflets exhibited normal  thickness and normal excursion. The mitral valve exhibited no  regurgitation.     HISTORY: No history of previous myocardial infarction. The patient has  hypertension, oral hypoglycemic-treated diabetes, and a family  history of coronary artery disease. There was no history of  cardiogenic shock, cerebrovascular disease, chronic lung disease,  cardiomyopathy, or cardiac arrest. PRIOR CARDIOVASCULAR PROCEDURES:  No history of valve surgery, coronary or graft percutaneous  intervention, or coronary bypass surgery.     PRIOR DIAGNOSTIC TEST RESULTS: No prior stress test is available.     PROCEDURES PERFORMED: Left heart catheterization with  ventriculography. Bilateral renal angiography. Procedure: Successful  Closure with Mynx     COMPLICATIONS: No complication occurred during the cath lab visit.     PROCEDURE: The risks and alternatives of the procedures and conscious  sedation were explained to the patient and informed consent was  obtained. The patient was brought to the cath lab and placed on the  table. The planned puncture sites were prepped and draped in the  usual sterile fashion.     -Left heart catheterization. A catheter was advanced  to the ascending  aorta. Ventriculography was performed using power injection of  contrast agent.     -Bilateral renal angiography. A catheter was positioned.     -Successful Closure with Mynx.     PROCEDURE COMPLETION: TIMING: Test started at 12:57. Test concluded at  13:27. RADIATION EXPOSURE: Fluoroscopy time: 7.9 min. Fluoroscopy  dose: 1.251 Gray.  MEDICATIONS GIVEN: Lasix (Furosemide), 40 mg, IV, at 13:20.  CONTRAST GIVEN: 300 ml Maximum Allowable Contrast Dose. Isovue 150 ml.     Prepared and signed by     Lujean Amel, MD  Signed 08/25/2013 13:56:54     STUDY DIAGRAM     Angiographic findings  Native coronary lesions:   Mid LAD: Lesion 1: 40 % stenosis.   Proximal RCA: Lesion 1: diffuse, 100 % stenosis.   Mid RCA: Lesion 1: diffuse, 70 % stenosis.     HEMODYNAMIC TABLES     Pressures:  Baseline  Pressures:  - HR: 89  Pressures:  - Rhythm:  Pressures:  -- Aortic Pressure (S/D/M): 169/73/106  Pressures:  -- Left Ventricle (s/edp): 188/9/--     Outputs:  Baseline  Outputs:  -- CALCULATIONS[age]  Outputs:  --  CALCULATIONS: Body Surface Area: 1.71  Outputs:  -- CALCULATIONS: Height in cm: 165.00  Outputs:  -- CALCULATIONS: Sex: Female  Outputs:  -- CALCULATIONS: Weight in kg: 64.40  Cardiology:    06-Aug-15 01:51, ED ECG  Ventricular Rate 93  Atrial Rate 76  QRS Duration 112  QT 394  QTc 489  R Axis 106  T Axis 64  ECG interpretation   Atrial fibrillation with premature ventricular or aberrantly conducted complexes  Low voltage QRS  Right bundle branch block  Septal infarct , age undetermined  Abnormal ECG  When compared with ECG of 08-Aug-2013 22:04,  Vent. rate has decreased BY  51 BPM  Right bundle branch block is now Present  ----------unconfirmed----------  Confirmed by OVERREAD, NOT ([id number]), editor PEARSON, BARBARA ([id number]) on 08/18/2013 8:57:18 AM  ED ECG     06-Aug-15 02:18, ECG  Ventricular Rate 121  Atrial Rate 121  P-R Interval  166  QRS Duration 68  QT 296  QTc 420  P Axis 75  R Axis 50  T Axis 57  ECG interpretation   Sinus tachycardia with Premature supraventricular complexes and Fusion complexes  Low voltage QRS  Cannot rule out Anteroseptal infarct (cited on or before 18-Aug-2013)  Abnormal ECG  When compared with ECG of 18-Aug-2013 01:51,  Sinus rhythm has replaced Atrial fibrillation  Right bundle branch block is no longer Present  Questionable change in initial forces of Anterior leads  ----------unconfirmed----------  Confirmed by OVERREAD, NOT ([id number]), editor PEARSON, BARBARA ([id number]) on 08/18/2013 8:57:53 AM  ECG     06-Aug-15 04:38, ECG  Ventricular Rate 106  Atrial Rate 0  QRS Duration 78  QT 382  QTc 507  R Axis 22  T Axis 68  ECG interpretation   Atrial fibrillation with rapid ventricular response with premature ventricular or aberrantly conducted complexes  Low voltage QRS  Septal infarct (cited on or before 18-Aug-2013)  Abnormal ECG  When compared with ECG of 18-Aug-2013 02:18,  Atrial fibrillation has replaced Sinus rhythm  Questionable change in initial forces of Anterior leads  ----------unconfirmed----------  Confirmed by OVERREAD, NOT (100), editor PEARSON, BARBARA ([id number]) on 08/18/2013 8:58:18 AM  ECG    Assessment/Plan:  Assessment/Plan:  Assessment IMP CHF AFIB SOB Cough Renal isuff  anemia  diabetes CAD .   Plan PLAN  continue amiodarone for the AFib  long-term anticoagulation with Eliquis 2.5 twice a day  aspirin 81 mg once a day  I had metoprolol for the 25 mg twice a day for rate control and blood pressure  continue Lasix for congestive heart failure  consider inhalers for bronchitis  continue diabetes management  status post cardiac catheterization with significant coronary disease and right coronary     artery will  normal LV function recommend medical therapy  increase activity  physical therapy/ occupational therapy  recommend medical therapy after  discussing case with  [facility]  follow-up cardiology as an outpatient in 1-2 weeks   Electronic Signatures: Lujean Amel D (MD)  (Signed 13-Aug-15 14:59)  Authored: Chief Complaint, VITAL SIGNS/ANCILLARY NOTES, Brief Assessment, Lab Results, Radiology Results, Assessment/Plan   Last Updated: 13-Aug-15 14:59 by Yolonda Kida (MD)

## 2014-05-06 NOTE — Consult Note (Signed)
Present Illness patient is a 81 a year old female who was admitted to the hospital with rather sudden onset of shortness of breath and fatigue.  She has a history of diabetes, hyperlipidemia and paroxysmally atrial fibrillation.  She was seen in our office and December, 2015. She was doing well at that time.  She was on amiodarone for her atrial fibrillation and diuresed with Demodex.  She was not felt to be a candidate for chronic anticoagulation.  She now returns with which she states was acute onset of shortness of breath.  She was noted to be in med mild fluid overload.  Her serum troponin was mildly elevated.  She had a cardiac catheterization in August of this year which revealed occluded RCA with collaterals from the  AD.  She was felt to be a candidate for medical management.  Her ejection fraction was normal.  She is improved rather quickly although does have evidence of acute on chronic renal insufficiency with a GFR of approximately 30. she denies chest pain at present.  She states she was compliant with her medications.   Physical Exam:  GEN well developed, well nourished   HEENT PERRL   NECK supple   RESP clear BS  no use of accessory muscles   CARD Irregular rate and rhythm  Murmur   Murmur Systolic   Systolic Murmur axilla   ABD denies tenderness  normal BS  no Adominal Mass   LYMPH negative neck, negative axillae   EXTR negative cyanosis/clubbing, positive edema   NEURO cranial nerves intact, motor/sensory function intact   PSYCH alert, poor insight   Review of Systems:  Subjective/Chief Complaint female  With shortness of breath   General: Fatigue   Skin: No Complaints   ENT: No Complaints   Eyes: No Complaints   Neck: No Complaints   Respiratory: Short of breath   Cardiovascular: Tightness  Palpitations  Dyspnea   Gastrointestinal: No Complaints   Genitourinary: No Complaints   Vascular: No Complaints   Musculoskeletal: No Complaints    Neurologic: No Complaints   Hematologic: No Complaints   Endocrine: No Complaints   Psychiatric: No Complaints   Review of Systems: All other systems were reviewed and found to be negative   Medications/Allergies Reviewed Medications/Allergies reviewed   Family & Social History:  Family and Social History:  Family History Non-Contributory   EKG:  EKG NSR   Abnormal NSSTTW changes   EKG Comparision Not changed from    Codeine: Other  Cymbalta: Other   Impression 81-year-old female history of paroxysmally atrial fibrillation admitted with sudden onset of shortness of breath.  She is a somewhat difficult historian due to some mild memory issues.  She is currently in sinus rhythm.  EKG does not show acute changes.  Chest x-ray shows probable mild congestive heart failure likely acute on chronic diastolic heart failure.  Her renal function is somewhat reduced with acute on chronic grade 3 E renal insufficiency.  She has a mild troponin elevation which may be secondary to her renal insufficiency as well as volume overload.  Concern over possible progression of Coronary disease was raised.  She had a cardiac catheterization approximately 4 months ago which revealed an occluded RCA with collaterals from the left with no high-grade left disease.  Consideration for repeat cardiac catheterization was removed discussed with the patient.  This point will determine her renal function in the morning and make a final decision regarding medical therapy versus repeat invasive evaluation.  Plan 1. Careful diuresis 2. Will add afterload reduction due to her mitral valve disease 3. Review her renal function in the morning 4. Consider invasive evaluation versus empiric medical therapy.  Given her recent catheterization 4 months ago, consideration for medical therapy his likely the best approach however will readdress cardiac catheterization in the morning.   Electronic Signatures: Teodoro Spray (MD)   (Signed 14-Dec-15 15:10)  Authored: General Aspect/Present Illness, History and Physical Exam, Review of System, Family & Social History, EKG , Allergies, Impression/Plan   Last Updated: 14-Dec-15 15:10 by Teodoro Spray (MD)

## 2014-05-06 NOTE — Consult Note (Signed)
Chief Complaint:  Subjective/Chief Complaint Patient extubated still  feels a little weak. shortness of breath is improved denies palpitations or tachycardia.   VITAL SIGNS/ANCILLARY NOTES: **Vital Signs.:   12-Aug-15 13:00  Vital Signs Type Routine  Temperature Temperature (F) 97.7  Celsius 36.5  Temperature Source oral  Pulse Pulse 93  Respirations Respirations 26  Systolic BP Systolic BP 696  Diastolic BP (mmHg) Diastolic BP (mmHg) 79  Mean BP 104  Pulse Ox % Pulse Ox % 97  Pulse Ox Activity Level  At rest  Oxygen Delivery 2L; Nasal Cannula  *Intake and Output.:   Daily 12-Aug-15 07:00  Grand Totals Intake:  586.6 Output:  2000    Net:  -1413.4 24 Hr.:  -1413.4  Enteral feeding ml     In:  0  Enteral Feeding flush intake      In:  0  IV (Primary)      In:  6.6  IV (Primary)      In:  10  IV (Primary)      In:  30  IV (Primary)      In:  415  IV (Primary)      In:  0  IV (Secondary)      In:  125  Urine ml     Out:  2000  Length of Stay Totals Intake:  17425.2 Output:  19960    Net:  -2534.8   Brief Assessment:  GEN well developed, well nourished, no acute distress   Cardiac Regular  murmur present  --Gallop   Respiratory normal resp effort  rhonchi   Gastrointestinal Normal   Gastrointestinal details normal Soft   EXTR negative cyanosis/clubbing, negative edema   Lab Results: Hepatic:  11-Aug-15 03:03   Bilirubin, Total 0.3  Alkaline Phosphatase 87 (46-116 NOTE: New Reference Range 08/02/13)  SGPT (ALT)  11 (14-63 NOTE: New Reference Range 08/02/13)  SGOT (AST) 19  Total Protein, Serum 6.7  Albumin, Serum  2.0  Lab:  11-Aug-15 10:37   pH (ABG) 7.420 (7.350-7.450 NOTE: New Reference Range 08/06/13)  PCO2 40 (32-48 NOTE: New Reference Range 08/23/13)  PO2 98 (83-108 NOTE: New Reference Range 08/06/13)  FiO2 30  Base Excess 0 (-3-3 NOTE: New Reference Range 08/23/13)  HCO3 25.9 (21.0-28.0 NOTE: New Reference Range 08/06/13)  O2 Saturation  97.0  O2 Device 840  Specimen Site (ABG) LT BRACHIAL  Specimen Type (ABG) ARTERIAL  PSV 10  PEEP 5.0  CPAP 0.5  %FiO2 30.0  Mechanical Rate 000 (Result(s) reported on 23 Aug 2013 at 10:51AM.)  Routine Chem:  11-Aug-15 03:03   Magnesium, Serum 2.4 (1.8-2.4 THERAPEUTIC RANGE: 4-7 mg/dL TOXIC: > 10 mg/dL  -----------------------)  Phosphorus, Serum 2.7 (Result(s) reported on 23 Aug 2013 at 03:38AM.)  Glucose, Serum  176  BUN  26  Sodium, Serum  135  Potassium, Serum  3.3  Chloride, Serum  96  CO2, Serum 26  Calcium (Total), Serum  8.2  Anion Gap 13  Osmolality (calc) 279  eGFR (African American)  45  eGFR (Non-African American)  39 (eGFR values <53m/min/1.73 m2 may be an indication of chronic kidney disease (CKD). Calculated eGFR is useful in patients with stable renal function. The eGFR calculation will not be reliable in acutely ill patients when serum creatinine is changing rapidly. It is not useful in  patients on dialysis. The eGFR calculation may not be applicable to patients at the low and high extremes of body sizes, pregnant women, and vegetarians.)  13:47   Potassium, Serum 4.6 (Result(s) reported on 23 Aug 2013 at 02:10PM.)  12-Aug-15 03:03   Magnesium, Serum  2.5 (1.8-2.4 THERAPEUTIC RANGE: 4-7 mg/dL TOXIC: > 10 mg/dL  -----------------------)  Phosphorus, Serum 2.5 (Result(s) reported on 24 Aug 2013 at 03:29AM.)  Glucose, Serum  201  BUN  25  Creatinine (comp) 1.10  Sodium, Serum 140  Potassium, Serum 4.2  Chloride, Serum 103  CO2, Serum 28  Calcium (Total), Serum 8.8  Anion Gap 9  Osmolality (calc) 289  eGFR (African American)  56  eGFR (Non-African American)  48 (eGFR values <55m/min/1.73 m2 may be an indication of chronic kidney disease (CKD). Calculated eGFR is useful in patients with stable renal function. The eGFR calculation will not be reliable in acutely ill patients when serum creatinine is changing rapidly. It is not useful in   patients on dialysis. The eGFR calculation may not be applicable to patients at the low and high extremes of body sizes, pregnant women, and vegetarians.)  Routine Hem:  11-Aug-15 03:03   WBC (CBC) 9.7  RBC (CBC)  3.14  Hemoglobin (CBC)  7.8  Hematocrit (CBC)  24.7  Platelet Count (CBC) 304  MCV  79  MCH  24.8  MCHC  31.5  RDW  16.4  Neutrophil % 71.8  Lymphocyte % 11.8  Monocyte % 13.5  Eosinophil % 2.1  Basophil % 0.8  Neutrophil #  7.0  Lymphocyte # 1.1  Monocyte #  1.3  Eosinophil # 0.2  Basophil # 0.1 (Result(s) reported on 23 Aug 2013 at 03:30AM.)  12-Aug-15 03:03   WBC (CBC) 10.2  RBC (CBC)  3.50  Hemoglobin (CBC)  8.8  Hematocrit (CBC)  27.3  Platelet Count (CBC) 364  MCV  78  MCH  25.1  MCHC 32.1  RDW  16.5  Neutrophil % 66.6  Lymphocyte % 16.7  Monocyte % 12.4  Eosinophil % 3.2  Basophil % 1.1  Neutrophil #  6.8  Lymphocyte # 1.7  Monocyte #  1.3  Eosinophil # 0.3  Basophil # 0.1 (Result(s) reported on 24 Aug 2013 at 03:52AM.)   Radiology Results: XRay:    06-Aug-15 01:51, Chest Portable Single View  Chest Portable Single View   REASON FOR EXAM:    intubation  COMMENTS:       PROCEDURE: DXR - DXR PORTABLE CHEST SINGLE VIEW  - Aug 18 2013  1:51AM     CLINICAL DATA:  Intubation.    EXAM:  PORTABLE CHEST - 1 VIEW    COMPARISON:  08/08/2013    FINDINGS:  Right mainstem intubation. Retraction by 4 cm would place the tip  between the clavicular heads and carina. The orogastric tube crosses  the diaphragm.    Normal heart size and upper mediastinal contours. Diffuse  interstitial coarsening with [facility] [employer] No effusion or  pneumothorax. Symmetric biapical thickening.    These results were called by telephone at the time of interpretation  on 08/18/2013 at 1:55 am [facility] verbally acknowledged  these results.     IMPRESSION:  1. Right mainstem intubation. Recommend endotracheal tube retraction  by 4 cm.  2. Orogastric tube  in good position.  3. Pulmonary edema.  Electronically Signed    By: JJorje GuildM.D.    On: 08/18/2013 01:56         Verified By: JGilford Silvius M.D.,    06-Aug-15 04:54, Chest Portable Single View  Chest Portable Single View   REASON  FOR EXAM:    LINE PLACEMENT  COMMENTS:       PROCEDURE: DXR - DXR PORTABLE CHEST SINGLE VIEW  - Aug 18 2013  4:54AM     CLINICAL DATA:  Central line placement    EXAM:  PORTABLE CHEST - 1 VIEW    COMPARISON:  Chest x-ray from the same day at 1:44 a.m.    FINDINGS:  Improved positioning of the endotracheal tube, now at the level of  the clavicular heads. The orogastric tube reaches the stomach at  least. New left IJ catheter, tip at the level of the distal SVC.    Unchanged heart size and upper mediastinal contours. New diffuse  perihilar airspace disease. Interstitial opacities persist. There  may be layering pleural fluid. No pneumothorax.     IMPRESSION:  1. New left IJ catheter is in good position.  No pneumothorax.  2. Endotrachealtube is in good position after shortening.  3. Worsening pulmonary edema.      Electronically Signed    By: Jorje Guild M.D.    On: 08/18/2013 04:58     Verified By: Gilford Silvius, M.D.,    08-Aug-15 10:05, Chest Portable Single View  Chest Portable Single View   REASON FOR EXAM:    pt being taken off vent  COMMENTS:       PROCEDURE: DXR - DXR PORTABLE CHEST SINGLE VIEW  - Aug 20 2013 10:05AM     CLINICAL DATA:  Respiratory failure.    EXAM:  PORTABLE CHEST - 1 VIEW    COMPARISON:  08/18/2013    FINDINGS:  Endotracheal tube tip is approximately 4 cm above the carina. Left  jugular central line shows stable positioning. Pulmonary edema  improved. No pneumothorax or significant pleural fluid. The heart  size is stable.     IMPRESSION:  Improving pulmonary edema.      Electronically Signed    By: Aletta Edouard M.D.    On: 08/20/2013 10:47         Verified By: Azzie Roup, M.D.,    09-Aug-15 14:38, Chest Portable Single View  Chest Portable Single View   REASON FOR EXAM:    ventilator  COMMENTS:       PROCEDURE: DXR - DXR PORTABLE CHEST SINGLE VIEW  - Aug 21 2013  2:38PM     CLINICAL DATA:  Respiratory failure, possible extubation    EXAM:  PORTABLE CHEST - 1 VIEW    COMPARISON:  08/20/2013    FINDINGS:  Endotracheal tube terminates 4.5 cm above the carina.  Multifocal patchy opacities, possibly reflecting multifocal  pneumonia versus asymmetric pulmonary edema, right upper lobe  predominant. Possible small pleural effusions. No pneumothorax.    Overall appearance is mildly improved.    The heart is normal in size.    Left IJ venous catheter terminating in the lower SVC.    Enteric tube terminates in the stomach.     IMPRESSION:  Endotracheal tube terminates 4.5 cm above the carina.  Multifocal patchy opacities, possibly reflecting multifocal  pneumonia or asymmetric pulmonary edema, right upper lobe  predominant. Possible small pleural effusions.    Overall appearance is mildly improved.      Electronically Signed    By: Julian Hy M.D.    On: 08/21/2013 14:48         Verified By: Julian Hy, M.D.,    10-Aug-15 06:39, Chest Portable Single View  Chest Portable Single View  REASON FOR EXAM:    assess for possible ventilator weaning trial  COMMENTS:       PROCEDURE: DXR - DXR PORTABLE CHEST SINGLE VIEW  - Aug 22 2013  6:39AM     CLINICAL DATA:  Assess for possible ventilator weaning.    EXAM:  PORTABLE CHEST - 1 VIEW    COMPARISON:  08/21/2013.    FINDINGS:  Endotracheal tube terminates 2.3 cm above the carina. Nasogastric  tube is followed into the stomach with the tip projecting beyond the  inferior margin of the image. Left IJ central line tip projects over  the SVC. Heart size stable. Right hilum is prominent. Mild diffuse  interstitial prominence and indistinctness persist. Biapical  pleural  thickening. Possible tiny bilateral effusions.     IMPRESSION:  1. Mild interstitial prominence and indistinctness, likely  reflecting edema, stable.  2. Right hilar prominence. Mass or adenopathy cannot be excluded.  Continued attention on followup exams is warranted.  3. Probable tiny bilateral effusions.      Electronically Signed    By: Lorin Picket M.D.    On: 08/22/2013 08:12         Verified By: Luretha Rued, M.D.,  Cardiology:    06-Aug-15 01:51, ED ECG  Ventricular Rate 93  Atrial Rate 76  QRS Duration 112  QT 394  QTc 489  R Axis 106  T Axis 64  ECG interpretation   Atrial fibrillation with premature ventricular or aberrantly conducted complexes  Low voltage QRS  Right bundle branch block  Septal infarct , age undetermined  Abnormal ECG  When compared with ECG of 08-Aug-2013 22:04,  Vent. rate has decreased BY  51 BPM  Right bundle branch block is now Present  ----------unconfirmed----------  Confirmed by OVERREAD, NOT ([id number]), editor PEARSON, BARBARA ([id number]) on 08/18/2013 8:57:18 AM  ED ECG     06-Aug-15 02:18, ECG  Ventricular Rate 121  Atrial Rate 121  P-R Interval 166  QRS Duration 68  QT 296  QTc 420  P Axis 75  R Axis 50  T Axis 57  ECG interpretation   Sinus tachycardia with Premature supraventricular complexes and Fusion complexes  Low voltage QRS  Cannot rule out Anteroseptal infarct (cited on or before 18-Aug-2013)  Abnormal ECG  When compared with ECG of 18-Aug-2013 01:51,  Sinus rhythm has replaced Atrial fibrillation  Right bundle branch block is no longer Present  Questionable change in initial forces of Anterior leads  ----------unconfirmed----------  Confirmed by OVERREAD, NOT ([id number]), editor PEARSON, BARBARA ([id number]) on 08/18/2013 8:57:53 AM  ECG     06-Aug-15 04:38, ECG  Ventricular Rate 106  Atrial Rate 0  QRS Duration 78  QT 382  QTc 507  R Axis 47  T Axis 68  ECG interpretation   Atrial fibrillation with rapid  ventricular response with premature ventricular or aberrantly conducted complexes  Low voltage QRS  Septal infarct (cited on or before 18-Aug-2013)  Abnormal ECG  When compared with ECG of 18-Aug-2013 02:18,  Atrial fibrillation has replaced Sinus rhythm  Questionable change in initial forces of Anterior leads  ----------unconfirmed----------  Confirmed by OVERREAD, NOT ([id number]), editor PEARSON, BARBARA ([id number]) on 08/18/2013 8:58:18 AM  ECG    Assessment/Plan:  Invasive Device Daily Assessment of Necessity:  Does the patient currently have any of the following indwelling devices? foley   Indwelling Urinary Catheter continued, requirement due to   Reason to continue Indwelling Urinary Catheter urinary incontinence, poor cooperation AND strict Intake/Output required  Assessment/Plan:  Assessment IMP  respiratory failure  Congestive Heart Failure  renal insufficiency  weakness  atrial fibrillation  altered mental status   diabetes .   Plan PLAN  I agree with extubation continued pulmonary support with inhalers  transfer to telemetry  recommend cardiac catheterization within 24-48 hr   continued diabetes management  amiodarone for atrial fibrillation  short term anticoagulation for now prior to  cath  would recommend long-term anticoagulation for AFib  continued heart failure treatment with diastolic dysfunction  physical therapy for increased activity  consider rehab  hopefully cardiac catheterization in the morning   Electronic Signatures: Lujean Amel D (MD)  (Signed 12-Aug-15 20:24)  Authored: Chief Complaint, VITAL SIGNS/ANCILLARY NOTES, Brief Assessment, Lab Results, Radiology Results, Assessment/Plan   Last Updated: 12-Aug-15 20:24 by Lujean Amel D (MD)

## 2014-05-06 NOTE — Op Note (Signed)
PATIENT NAME:  [name], Sharon MR#:  [id number] DATE OF BIRTH:  06-12-1933  DATE OF PROCEDURE:  11/28/2013  PREOPERATIVE DIAGNOSIS: Hypotension.   POSTOPERATIVE DIAGNOSIS: Hypotension.   PROCEDURE PERFORMED: Left femoral vein central venous catheter placement.   ANESTHESIA: General.   ESTIMATED BLOOD LOSS: 25 mL.   COMPLICATIONS: None.   SPECIMENS: None.  INDICATION FOR SURGERY: Ms. Staff is a recently admitted 81 year old female who presents with pulmonary edema, who has been hypotensive. I was consulted for central venous catheter placement.   DETAILS OF PROCEDURE AS FOLLOWS: Informed consent was obtained. Ms. Ansley was laid supine in the hospital bed. I used an ultrasound to visualize her internal jugular, which was adequate. However, upon placing drapes on patient, she complained of claustrophobia and reached onto the field requiring placement of 2 separate drapes before this procedure was abandoned. I then used ultrasound to visualize her left common femoral vein. This was accessed with one stick. A wire was placed through the needle. The needle was withdrawn. The tract was dilated up. The catheter was then placed over the wire. The wire was withdrawn. All 3 ports flushed and drew easily. The catheter was then sutured in place. A sterile dressing was then placed over the wound. There were no immediate complications. Needle, sponge, and instrument counts were correct at the end of the procedure.    ____________________________ Glena Norfolk. Camara Rosander, MD cal:jh D: 11/28/2013 03:57:11 ET T: 11/28/2013 32:00:37 ET JOB#: [id number]  cc: Harrell Gave A. Tarique Loveall, MD, <Dictator> Floyde Parkins MD ELECTRONICALLY SIGNED 12/19/2013 20:06

## 2014-05-06 NOTE — H&P (Signed)
PATIENT NAME:  Sharon [name], Sharon [name] MR#:  [id number] DATE OF BIRTH:  08/30/1933  PRIMARY CARE PHYSICIAN:  Rusty Aus, MD  CHIEF COMPLAINT: Shortness of breath.   HISTORY OF PRESENT ILLNESS: This is a 81 year old female who presents with shortness of breath going on all of a sudden.  It started this afternoon. No cough, just shortness of breath. Looking back at old records, she was recently discharged from the hospital on December 2 with CHF also.  Missed outpatient CHF clinic appointment. In the ER, she was found in respiratory distress, needed BiPAP in order to oxygenate, and is currently on 50% on BiPAP at this point. She does feel little bit better since coming into the ER. Chest x-ray consistent with heart failure. Hospitalist services were contacted for further evaluation. No complaints of chest pain.   PAST MEDICAL HISTORY: Atrial fibrillation, hypothyroidism, hypertension, diabetes, macular degeneration, diabetic neuropathy, hyperlipidemia, peripheral vascular disease, chronic anemia, anxiety, chronic kidney disease, stage 3.   PAST SURGICAL HISTORY: Bilateral hip repair, left leg revascularization, appendectomy, cholecystectomy.   ALLERGIES: INCLUDE CODEINE AND CYMBALTA.   MEDICATIONS: Include iron 28 mg 1 tablet daily, vitamin D3, 200 International Units daily, simvastatin 40 mg daily, PreserVision 1 tablet twice a day, Protonix 40 mg daily, levothyroxine 112 mcg daily, glipizide XL 2.5 mg once a day, gabapentin 300 mg t.i.d., but also has a script for 600 mg b.i.d., pharmacy tech to check to see which one she is taking. Aspirin 81 mg daily, amiodarone 200 mg daily, metoprolol ER 100 mg daily, torsemide 10 mg daily.   SOCIAL HISTORY: Lives alone. No smoking. No alcohol. No drug use. Used to be an Administrator for [employer].   FAMILY HISTORY: Mother died of heart disease. Father died of old age, also had peripheral vascular disease.  REVIEW OF SYSTEMS:  CONSTITUTIONAL: No fever, chills, or  sweats. Weight has been stable. Positive for fatigue.  EYES: Does wear glasses.  EARS, NOSE, MOUTH, AND THROAT: No hearing loss. No sore throat. No difficulty swallowing.  CARDIOVASCULAR: No chest pain. No palpitations.  RESPIRATORY: Positive for shortness of breath. No cough. No sputum. No hemoptysis.  GASTROINTESTINAL: No nausea. No vomiting. No abdominal pain. No diarrhea. No constipation. No bright red blood per rectum. No melena.  GENITOURINARY: No burning on urination or hematuria.  MUSCULOSKELETAL: Positive for joint pain.  INTEGUMENT: No rashes or eruptions.  NEUROLOGIC: No fainting or blackouts.  PSYCHIATRIC: Positive for anxiety, no depression.  ENDOCRINE: Positive for hypothyroidism.  HEMATOLOGIC AND LYMPHATIC: Positive for anemia.   PHYSICAL EXAMINATION: VITAL SIGNS: On presentation to the hospital included a temperature of 98.1, pulse 86, respirations 33, blood pressure 118/89, pulse oximetry 82% on room air.  GENERAL: Positive respiratory distress, using accessory muscles to breathe, currently on BiPAP.  EYES: Conjunctivae and lids normal. Pupils equal, round, and reactive to light. Extraocular muscles intact. No nystagmus.  EARS, NOSE, MOUTH, AND THROAT: Tympanic membranes, no erythema. Nasal mucosa, no erythema. Throat, no erythema, no exudates seen. Lips and gums, no lesions.  NECK: Positive for JVD. No bruits. No lymphadenopathy. No thyromegaly. No thyroid nodules palpated.  RESPIRATORY: Decreased breath sounds bilaterally. Positive rales entire lung field. Positive use of accessory muscles to breathe.  CARDIOVASCULAR SYSTEM: S1, S2 normal. No gallops, rubs, or murmurs heard. Carotid upstroke 2+ bilaterally. No bruits.  EXTREMITIES: Dorsalis pedis pulses 1+ bilaterally. Trace edema of the lower extremity.  ABDOMEN: Soft, nontender. No organomegaly, splenomegaly. Normoactive bowel sounds. No masses felt.  LYMPHATIC: No  lymph nodes in the neck.  MUSCULOSKELETAL: Trace edema.  No clubbing. No cyanosis, on BiPAP.  NEUROLOGIC: Cranial nerves II through XII grossly intact. Deep tendon reflexes 1+ bilateral lower extremity.  PSYCHIATRIC: The patient is oriented to person, place, and time.   LABORATORY AND RADIOLOGICAL DATA: Chest x-ray findings similar to prior exam. Patchy areas of airspace opacity, irregular interstitial densities, findings suggest congestive heart failure with pulmonary edema. White blood cell count 19.1, H and H, 12.2 and 39.9, platelet count of 328,000. Glucose 225, BUN 38, creatinine 2.05, sodium 131, potassium 4.2, chloride 101, CO2 of 19 calcium 8.0, alkaline phosphatase 129, ALT 28, AST 45, albumin 3.8. Troponin negative. BNP elevated at 2961. EKG, normal sinus rhythm, flipped T waves laterally.   ASSESSMENT AND PLAN: 1.  Acute respiratory failure with hypoxia requiring BiPAP at 50% in order to oxygenate. We will admit to critical care unit stepdown. High risk for intubation. Continue to monitor closely.  2.  Acute diastolic congestive heart failure with chronic kidney disease, stage 3. Potentially the torsemide is underdosed for her creatinine. May have to deal with a higher dose of Lasix as  outpatient. Again, it looks like patient had missed some outpatient congestive heart failure clinic appointments. I will continue the patient's metoprolol at this point. Patient also has moderate mitral regurgitation on previous echocardiogram, with normal ejection fraction.  3.  Atrial fibrillation, looks like she is in normal sinus rhythm on amiodarone and metoprolol. 4.  Hypertension. Blood pressure controlled on usual medications.  5.  Hyperlipidemia, on simvastatin.  6.  Gastroesophageal reflux disease, on Protonix.  7.  Hypothyroidism, on levothyroxine.  8.  Diabetes with neuropathy, continue glipizide and gabapentin. 9.  Hyponatremia. Continue to monitor with diuresis.   TIME SPENT ON ADMISSION: 55 minutes. The patient is a full code.      ____________________________ Tana Conch. Leslye Peer, MD rjw:LT D: 12/25/2013 17:26:21 ET T: 12/25/2013 18:11:11 ET JOB#: [id number]  cc: Tana Conch. Leslye Peer, MD, <Dictator> Rusty Aus, MD Marisue Brooklyn MD ELECTRONICALLY SIGNED 01/04/2014 15:43

## 2014-05-06 NOTE — Consult Note (Signed)
   Present Illness 81 year old female with no prior cardiac to history the who was admitted after presenting to the emergency room complaints of shortness breath.  Chest x-ray revealed mild bilateral pulmonary infiltrates versus pulmonary edema.  She was also noted to be in atrial fibrillation with variable ventricular response.  Does not appear that she had this previously.  She was placed on Cardizem with improved rate.  She has trivial troponin elevations which did not appear to be secondary to ischemia.  She denies chest pain.  She has improved since admission.   Physical Exam:  GEN no acute distress   HEENT hearing intact to voice   NECK supple   RESP no use of accessory muscles  rhonchi   CARD Irregular rate and rhythm  Tachycardic   ABD denies tenderness   LYMPH negative neck, negative axillae   EXTR negative cyanosis/clubbing, negative edema   SKIN normal to palpation   NEURO cranial nerves intact, motor/sensory function intact   PSYCH A+O to time, place, person   Review of Systems:  Subjective/Chief Complaint Shortness of breath   General: Fatigue   Skin: No Complaints   ENT: No Complaints   Eyes: No Complaints   Neck: No Complaints   Respiratory: Short of breath   Cardiovascular: Palpitations  Dyspnea   Gastrointestinal: No Complaints   Genitourinary: No Complaints   Vascular: No Complaints   Musculoskeletal: No Complaints   Neurologic: No Complaints   Hematologic: No Complaints   Endocrine: No Complaints   Psychiatric: No Complaints   Review of Systems: All other systems were reviewed and found to be negative   Medications/Allergies Reviewed Medications/Allergies reviewed   Family & Social History:  Family and Social History:  Family History Non-Contributory   Social History negative tobacco   EKG:  Interpretation atrial fibrillation with variable ventricular response    Codeine: Other   Impression a 81 year old female with no  prior cardiac history by report who was admitted with shortness of breath.  She was noted to be in atrial fibrillation with variable ventricular response.  The she was noted to have mild pulmonary   Edema versus pulmonary infiltrates on x-ray.  She was placed on Cardizem drip with improved heart rate response.  She is currently hemodynamically stable resting comfortably lying flat in bed.  She has improved with diuresis and rate control. will continue control her rate and follow her for evidence of pulmonary edema.  Her chads score is 4.  Will continue with metoprolol for rate control.  With regard to chronic anticoagulation per her primary care provider's note, she has high fall risk and therefore would defer chronic anticoagulation   Plan 1. Continue to rate control with metoprolol 2. Continue to gently diurese 3. Avoid  chronic anticoagulation due to fall risk 4.  further recommendations pending course   Electronic Signatures: Teodoro Spray (MD)  (Signed 28-Jul-15 19:37)  Authored: General Aspect/Present Illness, History and Physical Exam, Review of System, Family & Social History, EKG , Allergies, Impression/Plan   Last Updated: 28-Jul-15 19:37 by Teodoro Spray (MD)

## 2014-05-06 NOTE — Consult Note (Signed)
Chief Complaint:  Subjective/Chief Complaint Pt extubated and states to be doing ok. She denies cp but states that her throat is a little sore.   VITAL SIGNS/ANCILLARY NOTES: **Vital Signs.:   11-Aug-15 11:00  Temperature Temperature (F) 98.3  Celsius 36.8  Temperature Source oral  Pulse Pulse 104  Respirations Respirations 32  Systolic BP Systolic BP 903  Diastolic BP (mmHg) Diastolic BP (mmHg) 74  Mean BP 89  Pulse Ox % Pulse Ox % 98  Pulse Ox Activity Level  At rest  Oxygen Delivery 5L; Nasal Cannula  *Intake and Output.:   11-Aug-15 11:00  Grand Totals Intake:  16.6 Output:  500    Net:  -483.4 24 Hr.:  -337  Enteral feeding ml     In:  0  Enteral Feeding flush intake      In:  0  IV (Primary)      In:  0  IV (Primary)      In:  0  IV (Primary)      In:  0  IV (Primary)      In:  16.6  IV (Primary)      In:  0  Urine ml     Out:  500  Urinary Method  Foley   Brief Assessment:  GEN well developed, well nourished, no acute distress   Cardiac Regular  murmur present  --Gallop   Respiratory clear BS  no use of accessory muscles  rhonchi   Gastrointestinal Normal   Gastrointestinal details normal Soft   EXTR negative cyanosis/clubbing, negative edema   Lab Results: Hepatic:  11-Aug-15 03:03   Bilirubin, Total 0.3  Alkaline Phosphatase 87 (46-116 NOTE: New Reference Range 08/02/13)  SGPT (ALT)  11 (14-63 NOTE: New Reference Range 08/02/13)  SGOT (AST) 19  Total Protein, Serum 6.7  Albumin, Serum  2.0  Lab:  11-Aug-15 10:37   pH (ABG) 7.420 (7.350-7.450 NOTE: New Reference Range 08/06/13)  PCO2 40 (32-48 NOTE: New Reference Range 08/23/13)  PO2 98 (83-108 NOTE: New Reference Range 08/06/13)  FiO2 30  Base Excess 0 (-3-3 NOTE: New Reference Range 08/23/13)  HCO3 25.9 (21.0-28.0 NOTE: New Reference Range 08/06/13)  O2 Saturation 97.0  O2 Device 840  Specimen Site (ABG) LT BRACHIAL  Specimen Type (ABG) ARTERIAL  PSV 10  PEEP 5.0  CPAP 0.5   %FiO2 30.0  Mechanical Rate 000 (Result(s) reported on 23 Aug 2013 at 10:51AM.)  Routine Chem:  11-Aug-15 03:03   Potassium, Serum  3.3  Phosphorus, Serum 2.7 (Result(s) reported on 23 Aug 2013 at 03:38AM.)  Magnesium, Serum 2.4 (1.8-2.4 THERAPEUTIC RANGE: 4-7 mg/dL TOXIC: > 10 mg/dL  -----------------------)  Glucose, Serum  176  BUN  26  Creatinine (comp)  1.32  Sodium, Serum  135  Chloride, Serum  96  CO2, Serum 26  Calcium (Total), Serum  8.2  Osmolality (calc) 279  eGFR (African American)  45  eGFR (Non-African American)  39 (eGFR values <44mL/min/1.73 m2 may be an indication of chronic kidney disease (CKD). Calculated eGFR is useful in patients with stable renal function. The eGFR calculation will not be reliable in acutely ill patients when serum creatinine is changing rapidly. It is not useful in  patients on dialysis. The eGFR calculation may not be applicable to patients at the low and high extremes of body sizes, pregnant women, and vegetarians.)  Anion Gap 13    13:47   Potassium, Serum 4.6 (Result(s) reported on 23 Aug 2013 at 02:10PM.)  Routine  Hem:  11-Aug-15 03:03   WBC (CBC) 9.7  RBC (CBC)  3.14  Hemoglobin (CBC)  7.8  Hematocrit (CBC)  24.7  Platelet Count (CBC) 304  MCV  79  MCH  24.8  MCHC  31.5  RDW  16.4  Neutrophil % 71.8  Lymphocyte % 11.8  Monocyte % 13.5  Eosinophil % 2.1  Basophil % 0.8  Neutrophil #  7.0  Lymphocyte # 1.1  Monocyte #  1.3  Eosinophil # 0.2  Basophil # 0.1 (Result(s) reported on 23 Aug 2013 at 03:30AM.)   Radiology Results: XRay:    06-Aug-15 01:51, Chest Portable Single View  Chest Portable Single View   REASON FOR EXAM:    intubation  COMMENTS:       PROCEDURE: DXR - DXR PORTABLE CHEST SINGLE VIEW  - Aug 18 2013  1:51AM     CLINICAL DATA:  Intubation.    EXAM:  PORTABLE CHEST - 1 VIEW    COMPARISON:  08/08/2013    FINDINGS:  Right mainstem intubation. Retraction by 4 cm would place the tip  between  the clavicular heads and carina. The orogastric tube crosses  the diaphragm.    Normal heart size and upper mediastinal contours. Diffuse  interstitial coarsening with [employer]. No effusion or  pneumothorax. Symmetric biapical thickening.    These results were called by telephone at the time of interpretation  on 08/18/2013 at 1:55 am to [facility], who verbally acknowledged  these results.     IMPRESSION:  1. Right mainstem intubation. Recommend endotracheal tube retraction  by 4 cm.  2. Orogastric tube in good position.  3. Pulmonary edema.  Electronically Signed    By: Jorje Guild M.D.    On: 08/18/2013 01:56         Verified By: Gilford Silvius, M.D.,    06-Aug-15 04:54, Chest Portable Single View  Chest Portable Single View   REASON FOR EXAM:    LINE PLACEMENT  COMMENTS:       PROCEDURE: DXR - DXR PORTABLE CHEST SINGLE VIEW  - Aug 18 2013  4:54AM     CLINICAL DATA:  Central line placement    EXAM:  PORTABLE CHEST - 1 VIEW    COMPARISON:  Chest x-ray from the same day at 1:44 a.m.    FINDINGS:  Improved positioning of the endotracheal tube, now at the level of  the clavicular heads. The orogastric tube reaches the stomach at  least. New left IJ catheter, tip at the level of the distal SVC.    Unchanged heart size and upper mediastinal contours. New diffuse  perihilar airspace disease. Interstitial opacities persist. There  may be layering pleural fluid. No pneumothorax.     IMPRESSION:  1. New left IJ catheter is in good position.  No pneumothorax.  2. Endotrachealtube is in good position after shortening.  3. Worsening pulmonary edema.      Electronically Signed    By: Jorje Guild M.D.    On: 08/18/2013 04:58     Verified By: Gilford Silvius, M.D.,    08-Aug-15 10:05, Chest Portable Single View  Chest Portable Single View   REASON FOR EXAM:    pt being taken off vent  COMMENTS:       PROCEDURE: DXR - DXR PORTABLE CHEST SINGLE VIEW  -  Aug 20 2013 10:05AM     CLINICAL DATA:  Respiratory failure.    EXAM:  PORTABLE CHEST - 1 VIEW  COMPARISON:  08/18/2013    FINDINGS:  Endotracheal tube tip is approximately 4 cm above the carina. Left  jugular central line shows stable positioning. Pulmonary edema  improved. No pneumothorax or significant pleural fluid. The heart  size is stable.     IMPRESSION:  Improving pulmonary edema.      Electronically Signed    By: Aletta Edouard M.D.    On: 08/20/2013 10:47         Verified By: Azzie Roup, M.D.,    09-Aug-15 14:38, Chest Portable Single View  Chest Portable Single View   REASON FOR EXAM:    ventilator  COMMENTS:       PROCEDURE: DXR - DXR PORTABLE CHEST SINGLE VIEW  - Aug 21 2013  2:38PM     CLINICAL DATA:  Respiratory failure, possible extubation    EXAM:  PORTABLE CHEST - 1 VIEW    COMPARISON:  08/20/2013    FINDINGS:  Endotracheal tube terminates 4.5 cm above the carina.  Multifocal patchy opacities, possibly reflecting multifocal  pneumonia versus asymmetric pulmonary edema, right upper lobe  predominant. Possible small pleural effusions. No pneumothorax.    Overall appearance is mildly improved.    The heart is normal in size.    Left IJ venous catheter terminating in the lower SVC.    Enteric tube terminates in the stomach.     IMPRESSION:  Endotracheal tube terminates 4.5 cm above the carina.  Multifocal patchy opacities, possibly reflecting multifocal  pneumonia or asymmetric pulmonary edema, right upper lobe  predominant. Possible small pleural effusions.    Overall appearance is mildly improved.      Electronically Signed    By: Julian Hy M.D.    On: 08/21/2013 14:48         Verified By: Julian Hy, M.D.,    10-Aug-15 06:39, Chest Portable Single View  Chest Portable Single View   REASON FOR EXAM:    assess for possible ventilator weaning trial  COMMENTS:       PROCEDURE: DXR - DXR PORTABLE CHEST  SINGLE VIEW  - Aug 22 2013  6:39AM     CLINICAL DATA:  Assess for possible ventilator weaning.    EXAM:  PORTABLE CHEST - 1 VIEW    COMPARISON:  08/21/2013.    FINDINGS:  Endotracheal tube terminates 2.3 cm above the carina. Nasogastric  tube is followed into the stomach with the tip projecting beyond the  inferior margin of the image. Left IJ central line tip projects over  the SVC. Heart size stable. Right hilum is prominent. Mild diffuse  interstitial prominence and indistinctness persist. Biapical pleural  thickening. Possible tiny bilateral effusions.     IMPRESSION:  1. Mild interstitial prominence and indistinctness, likely  reflecting edema, stable.  2. Right hilar prominence. Mass or adenopathy cannot be excluded.  Continued attention on followup exams is warranted.  3. Probable tiny bilateral effusions.      Electronically Signed    By: Lorin Picket M.D.    On: 08/22/2013 08:12         Verified By: Luretha Rued, M.D.,  Cardiology:    06-Aug-15 01:51, ED ECG  Ventricular Rate 93  Atrial Rate 76  QRS Duration 112  QT 394  QTc 489  R Axis 106  T Axis 64  ECG interpretation   Atrial fibrillation with premature ventricular or aberrantly conducted complexes  Low voltage QRS  Right bundle branch block  Septal infarct , age  undetermined  Abnormal ECG  When compared with ECG of 08-Aug-2013 22:04,  Vent. rate has decreased BY  51 BPM  Right bundle branch block is now Present  ----------unconfirmed----------  Confirmed by OVERREAD, NOT ([id number]), editor PEARSON, BARBARA ([id number]) on 08/18/2013 8:57:18 AM  ED ECG     06-Aug-15 02:18, ECG  Ventricular Rate 121  Atrial Rate 121  P-R Interval 166  QRS Duration 68  QT 296  QTc 420  P Axis 75  R Axis 50  T Axis 57  ECG interpretation   Sinus tachycardia with Premature supraventricular complexes and Fusion complexes  Low voltage QRS  Cannot rule out Anteroseptal infarct (cited on or before  18-Aug-2013)  Abnormal ECG  When compared with ECG of 18-Aug-2013 01:51,  Sinus rhythm has replaced Atrial fibrillation  Right bundle branch block is no longer Present  Questionable change in initial forces of Anterior leads  ----------unconfirmed----------  Confirmed by OVERREAD, NOT ([id number]), editor PEARSON, BARBARA ([id number]) on 08/18/2013 8:57:53 AM  ECG     06-Aug-15 04:38, ECG  Ventricular Rate 106  Atrial Rate 0  QRS Duration 78  QT 382  QTc 507  R Axis 47  T Axis 68  ECG interpretation   Atrial fibrillation with rapid ventricular response with premature ventricular or aberrantly conducted complexes  Low voltage QRS  Septal infarct (cited on or before 18-Aug-2013)  Abnormal ECG  When compared with ECG of 18-Aug-2013 02:18,  Atrial fibrillation has replaced Sinus rhythm  Questionable change in initial forces of Anterior leads  ----------unconfirmed----------  Confirmed by OVERREAD, NOT ([id number]), editor PEARSON, BARBARA ([id number]) on 08/18/2013 8:58:18 AM  ECG    Assessment/Plan:  Invasive Device Daily Assessment of Necessity:  Does the patient currently have any of the following indwelling devices? foley   Indwelling Urinary Catheter continued, requirement due to   Reason to continue Indwelling Urinary Catheter immobilization due to sedation/paralysis   Assessment/Plan:  Assessment IMP Resp failure  CHF AFIB Pneumonia Sudden Death SOB .   Plan PLAN S/P extubation Continue inhalers Agree with 02 [place]  Agree with IV antibx Continue Amiodarone for AFIB Long term anticoug for AFIB Recommend cardiac cath prior to d/c IV lasix for CHF Agressive medical therapy Agree with palliative care   Electronic Signatures: Yolonda Kida (MD)  (Signed 11-Aug-15 23:36)  Authored: Chief Complaint, VITAL SIGNS/ANCILLARY NOTES, Brief Assessment, Lab Results, Radiology Results, Assessment/Plan   Last Updated: 11-Aug-15 23:36 by Lujean Amel D (MD)

## 2014-05-06 NOTE — H&P (Signed)
PATIENT NAME:  Sharon [name], Sharon [name] MR#:  [id number] DATE OF BIRTH:  03/25/1933  DATE OF ADMISSION:  12/12/2013  PRIMARY CARE PHYSICIAN:  Rusty Aus, MD   REFERRING PHYSICIAN:  Debbrah Alar, MD   CHIEF COMPLAINT:  Shortness of breath.   HISTORY OF PRESENT ILLNESS:  Sharon [name] is a 81 year old female with history of atrial fibrillation, hypothyroidism, hypertension, and diabetes mellitus, who came to the Emergency Department with sudden onset of shortness of breath. The patient states she was experiencing some shortness of breath during the daytime; however, it significantly worsened within a short time. She called EMS and was brought to the Emergency Department. The patient was initially placed on BiPAP with good improvement. Denies having any chest pain, palpitations, nausea, or vomiting. She did not notice to have any increased swelling in the lower extremities.   Workup in the Emergency Department with chest x-ray shows diffuse interstitial airspace opacities likely representing edema. The patient is also found to have elevated white blood cell count of 16,000 and found to have BUN of 33 and creatinine of 2.02. The patient was given one dose of Lasix and breathing treatments.   PAST MEDICAL HISTORY: 1.  Atrial fibrillation.  2.  Hypothyroidism.  3.  Hypertension.  4.  Diabetes mellitus type 2.  5.  Macular degeneration.  6.  Diabetic neuropathy.  7.  Hyperlipidemia.  8.  Peripheral vascular disease.  9.  Chronic anemia.  10.  Anxiety disorder.   PAST SURGICAL HISTORY:  1.  Bilateral hip replacement.  2.  Left leg revascularization.  3.  Appendectomy.  4.  Cholecystectomy.   ALLERGIES:  CODEINE AND CYMBALTA.   HOME MEDICATIONS: 1.  Vitamin D3, 1000 units once a day.  2.  Simvastatin 40 mg once a day.  3.  PreserVision 1 tablet 2 times a day.  4.  Protonix 40 mg once a day.  5.  Levothyroxine 112 mcg once a day.  6.  Glipizide extended-release 1 tablet once a day.  7.   Gabapentin 400 mg 1 tablet 2 times a day.  8.  Ferrous sulfate 325 mg once a day.  9.  Capsaicin application topically.  10.  Aspirin 81 mg daily.  11.  Amiodarone 200 mg 2 times a day.   SOCIAL HISTORY:  Quit smoking many years back. Denies drinking alcohol or using illicit drugs. Lives by herself.   FAMILY HISTORY:  Mother deceased of heart disease.   REVIEW OF SYSTEMS: CONSTITUTIONAL:  Experiencing generalized weakness.  EYES:  No change in vision.  EARS, NOSE, AND THROAT:  No change in hearing.  RESPIRATORY:  Has cough and shortness of breath.  CARDIOVASCULAR:  No chest pain, palpitations.  GASTROINTESTINAL:  No nausea, vomiting, or abdominal pain.  GENITOURINARY:  No dysuria or hematuria.  HEMATOLOGIC:  No easy bruising or bleeding.  SKIN:  No rash or lesions.  MUSCULOSKELETAL:  No joint pains and aches.  NEUROLOGIC:  No weakness or numbness in any part of the body.   PHYSICAL EXAMINATION: GENERAL:  This is a well-built, well-nourished, age-appropriate female lying down in the bed on BiPAP.  VITAL SIGNS:  Temperature 97.9, pulse 82, blood pressure 158/93, respiratory rate 22, oxygen saturation 100% on 4 liters of oxygen.  HEENT:  Head is normocephalic and atraumatic. There is no scleral icterus. Conjunctivae are normal. Pupils are equal and react to light. Mucous membranes are moist. No pharyngeal erythema.  NECK:  Supple. No lymphadenopathy. No JVD. No carotid bruit. No  thyromegaly.  CHEST:  Has no focal tenderness. Bilateral diffuse crackles are heard.  HEART:  S1 and S2, regular. No murmurs are heard.  ABDOMEN:  Bowel sounds present. Soft, nontender, nondistended. No hepatosplenomegaly.  EXTREMITIES:  No pedal edema. Pulses are 2+.  NEUROLOGIC:  The patient is alert and oriented to place, person, and time. Cranial nerves II through XII are intact. Motor is 5/5 in upper and lower extremities.   LABORATORY DATA:  CBC:  WBC of 16,000, hemoglobin 11, and platelet count of  318,000.   CMP:  BUN 33, creatinine 2.02, the rest of all the values are within normal limits.   Troponin is less than 0.02.   IMAGING STUDIES:  Chest x-ray, one view portable:  Diffuse interstitial airspace opacities likely representing edema.   ASSESSMENT AND PLAN:  Sharon [name] is a 81 year old female with multiple admissions for flash pulmonary edema.   1.  Congestive heart failure, acute on chronic. Continue with Lasix. Also concerned about if the patient has any underlying mild infection.  2.  Chronic obstructive pulmonary disease exacerbation. Continue with the breathing treatments and Solu-Medrol.  3.  Hypertension, currently well controlled. Continue with home medications.  4.  Peripheral neuropathy. Continue with home medications.  5.  Diabetes mellitus. Continue with glipizide and sliding scale insulin.  6.  Hypothyroidism. Continue Synthroid.  7.  Atrial fibrillation. Rate is currently well controlled. Not on any anticoagulation considering the patient's frequent falls .   TIME SPENT:  55 minutes.     ____________________________ Monica Becton, MD pv:nb D: 12/12/2013 00:41:40 ET T: 12/12/2013 00:53:47 ET JOB#: [id number]  cc: Monica Becton, MD, <Dictator> Rusty Aus, MD Grier Mitts Javis Abboud MD ELECTRONICALLY SIGNED 12/23/2013 21:30

## 2014-05-06 NOTE — H&P (Signed)
PATIENT NAME:  Sharon [name], Sharon [name] MR#:  [id number] DATE OF BIRTH:  1933/10/05  DATE OF ADMISSION:  08/18/2013  REFERRING PHYSICIAN: Wells Guiles L. Reita Cliche, MD  PRIMARY CARE PHYSICIAN: Rusty Aus, MD  CHIEF COMPLAINT:  Shortness of breath, status post cardiopulmonary arrest.   HISTORY OF PRESENT ILLNESS: This is a 81 year old female with a known history of hypertension, diabetes, neuropathy, with recent admission on July 27 for new onset congestive heart failure, new onset AFib. The patient was seen and evaluated by cardiology with echocardiogram showing normal EF, without LV systolic dysfunction.  she was admitted then with AFib with RVR. Her acute systolic CHF was thought secondary to uncontrolled AFib.  The patient was discharged home on aspirin, as she was high risk for fall, so contraindication for warfarin. The patient presents with shortness of breath. She called EMS, where she was short of breath over the last hour. In the ambulance, the patient was had more severe respiratory distress, which she was started on CPAP, which she was not tolerating it very well. As well, the patient was noticed by EMS staff to have foaming from mouth and nose, secondary to her CHF.   The patient was in severe respiratory distress in the ED, where she had PEA/cardiopulmonary arrest. CPR was initiated for around 10 to 15 minutes. The patient as well had an episode of vomiting during that episode, where she most likely aspirated as well. The patient successful regained pulse, with heart rate being in AFib, with rate controlled. The patient was hypotensive, requiring pressors. Her blood workup was significant for uncontrolled glucose and hyponatremia and significant leukocytosis at 24,000. Her urinalysis was mildly positive. The patient's ABG did show severe metabolic acidosis with a pH of 7.1, with elevated lactic acid at 6.8.   PAST MEDICAL HISTORY:   1.  Hypertension.  2.  Diabetes.  3.  Neuropathy.  4.  Macular  degeneration.  5.  Peripheral vascular disease.  6.  Hypothyroidism.   7.  AFib.  8.  Congestive heart failure secondary to AFib, acute systolic.   PAST SURGICAL HISTORY:  1.  Left hip repair.  2.  Vascular surgery of the left leg.  3.  Appendectomy.  4.  Cholecystectomy.   ALLERGIES: No known drug allergies.   HOME MEDICATIONS:  1.  Glipizide/metformin 5/500 two tablets b.i.d.  2.  Gabapentin 600 t.i.d.  3.  Synthroid 112 mcg daily.  4.  Ferrous sulfate 325 mg daily.  5.  Simvastatin 40 mg at bedtime.  6.  Docusate b.i.d.  7.  Toprol-XL 100 mg, 1/2 tablet in the morning and 1 tablet in the evening.  8.  Cymbalta 30 mg daily.  9.  Aspirin 81 mg daily.  10.  Demadex 10 mg oral b.i.d.  11.  Potassium chloride 10 mEq b.i.d.   SOCIAL HISTORY: No smoking. No alcohol. No drug use. Lives at home alone.   FAMILY HISTORY: Father died at 37 years of old age; had history of hypertension. Mother had history of myocardial infarction.   REVIEW OF SYSTEMS: Unable to obtain, as the patient is sedated and intubated.   PHYSICAL EXAMINATION:  VITAL SIGNS: Temperature 98.6, pulse 86, respiratory rate 20, blood pressure 137/95, saturating 100% on the vent, assist control mode, tidal volume of 500, rate of 16, and PEEP of 5.  GENERAL: Frail, elderly, chronically ill-appearing female who lies in bed, sedated and intubated.  HEENT: Head is atraumatic, normocephalic. Pupils are slightly dilated at 8 mm, sluggish but reactive,  constrict about 4 mm bilaterally. Has orogastric and ET tube in place, with NG tube on suction.  NECK: Supple. No thyromegaly. No JVD. No carotid bruits.   CHEST: Good air entry bilaterally. No wheezing. Has coarse respiratory sounds.  CARDIOVASCULAR: S1, S2 heard. No rubs, murmur, or gallops. Irregularly irregular.  ABDOMEN: Soft, nontender, nondistended. Bowel sounds present.  EXTREMITIES:  Edema +2, bilaterally. No clubbing. No cyanosis. Pedal pulses felt bilaterally.   PSYCHIATRIC: Unable to evaluate as the patient is sedated.  NEUROLOGIC: Unable to evaluate as the patient is sedated, but the patient appears to be responsive to loud verbal stimuli. Sometimes grimaces and does try to open eyes, as well.  SKIN: Normal skin turgor. Warm and dry.  LYMPHATIC: No cervical or supraclavicular lymphadenopathy noticed.  VASCULAR: Has   triple-lumen catheter, internal jugular vein.  MUSCULOSKELETAL: No joint swelling or erythema.   PERTINENT LABORATORY DATA: Glucose 393. BNP 3507. Glucose 312, BUN 16, creatinine 1.35, sodium 128, potassium 4.9, chloride 94, CO2 of 16. ALT 24, AST 56, alkaline phosphatase 110. Troponin less than 0.02. White blood cell 24, hemoglobin 12.8, hematocrit 42, platelets 433,000. Urinalysis +3 bacteria, trace leukocyte esterase, 24 white blood cells.   ABG showing pH of 7.1, pCO2 of 43, pO2 64.   Chest x-ray, most recent, showing placement of the triple-lumen catheter in the  IJ with good position, and worsening pulmonary edema.   ASSESSMENT AND PLAN:  1.  Cardiopulmonary arrest. This is due to respiratory distress from pulmonary edema. The patient will continue with vent support. We will consult pulmonary for vent management. The patient will need to be diuresed, but currently, she still critical/unstable, requiring pressors to maintain her blood pressure, so would diurese whenever she is more stable. We will consult cardiology as well, to evaluate for her pulmonary edema and CHF. The patient had a normal echocardiogram done last week, unclear what is the etiology for  CHF. Will cycle her cardiac enzymes, follow the trend, and likely she had a cardiac event as her troponins are negative. We will continue to cycle her cardiac enzymes and follow the trend. Will keep her on pressor as well. Will consult the cardiology service. Will continue her on sedation with propofol and p.r.n. Ativan.  2.  Pulmonary edema. The patient will be diuresed when she is  more stable. We will consult cardiology.  3.  Leukocytosis. Etiology is unclear. Possible stress from her cardiac arrest versus aspiration pneumonia, as the patient was noticed to have copious vomiting during her code. As well, versus UTI; the patient will be started on broad-spectrum IV antibiotic. We will follow on the cultures.  4.  Hyponatremia. This is due to volume overload. This should improve when the patient starts on diuresis.  5.  Acute atrial fibrillation. Currently, the patient is rate controlled. We will hold her beta blockers, given her hypotension, and if needed, will give her digoxin for heart rate control.  6.  Diabetes mellitus. The patient will be started on an insulin drip per intensive care unit protocol.  7.  Hypothyroidism. Continue with Synthroid.  8.  Hyperlipidemia. Continue with statin.  9.  Deep vein thrombosis prophylaxis. Subcutaneous heparin.  10.  Code Status: Full Code.   This was discussed with both sons at bedside.   TOTAL TIME SPENT ON ADMISSION AND PATIENT CARE AND CRITICAL CARE TIME: 60 minutes   ____________________________ Albertine Patricia, MD dse:ms D: 08/18/2013 05:44:03 ET T: 08/18/2013 07:21:08 ET JOB#: [id number]  cc: Albertine Patricia,  MD, <Dictator> Azalyn Sliwa Graciela Husbands MD ELECTRONICALLY SIGNED 08/19/2013 7:32

## 2014-05-06 NOTE — Consult Note (Signed)
Chief Complaint:  Subjective/Chief Complaint Intubated attempting to wean off  the vent.  history of atrial fibrillation under study failure possibly related to congestive heart failure. denies chest pain still short of breath   VITAL SIGNS/ANCILLARY NOTES: **Vital Signs.:   08-Aug-15 08:00  Vital Signs Type Routine  Temperature Temperature (F) 99.4  Celsius 37.4  Temperature Source oral; 98.7 axillary  Pulse Pulse 79  Pulse source if not from Vital Sign Device per cardiac monitor  Respirations Respirations 16  Systolic BP Systolic BP 414  Diastolic BP (mmHg) Diastolic BP (mmHg) 66  Mean BP 79  BP Source  if not from Vital Sign Device non-invasive  Pulse Ox % Pulse Ox % 97  Pulse Ox Activity Level  At rest  Oxygen Delivery Ventilator Assisted; fio2 30%  *Intake and Output.:   Shift 08-Aug-15 07:00  Grand Totals Intake:  1688.2 Output:  1225    Net:  463.2 24 Hr.:  1173.4  Enteral feeding ml     In:  315  Enteral Feeding flush intake      In:  225  IV (Primary)      In:  675  IV (Primary)      In:  76.1  IV (Primary)      In:  90  IV (Primary)      In:  107.1  Other Intake cc     In:  200  Urine ml     Out:  1225  Length of Stay Totals Intake:  7487.2 Output:  4460    Net:  3027.2   Brief Assessment:  GEN well nourished, no acute distress, thin, intubated   Cardiac Irregular  murmur present   Respiratory normal resp effort  clear BS  rhonchi  crackles  intubated   Gastrointestinal details normal Soft  Nontender  Nondistended  No masses palpable   EXTR negative cyanosis/clubbing, negative edema   Lab Results: Hepatic:  08-Aug-15 04:48   Bilirubin, Total 0.6  Alkaline Phosphatase 80 (46-116 NOTE: New Reference Range 08/02/13)  SGPT (ALT) 18 (14-63 NOTE: New Reference Range 08/02/13)  SGOT (AST) 28  Total Protein, Serum  5.4  Albumin, Serum  2.0  Lab:  07-Aug-15 09:05   pH (ABG) 7.35 (7.35-7.45 NOTE: New Reference Range 08/06/13)  PCO2  56  (32-48 NOTE: New Reference Range 08/06/13)  PO2  47 (83-108 NOTE: New Reference Range 08/06/13)  FiO2 40  Base Excess  3.8 (-2.0-3.0 NOTE: New Reference Range 08/06/13)  HCO3  30.9 (21-28 NOTE: New Reference Range 08/06/13)  O2 Saturation  80.5  O2 Device 840  Specimen Site (ABG) RT RADIAL  Specimen Type (ABG) ARTERIAL  Patient Temp (ABG) 37.0  Mode SPONTANEOUS  PSV 8  PEEP 5.0  Routine Chem:  07-Aug-15 05:04   Result Comment LABS - This specimen was collected through an   - indwelling catheter or arterial line.  - A minimum of 36ms of blood was wasted prior    - to collecting the sample.  Interpret  - results with caution.  Result(s) reported on 19 Aug 2013 at 06:29AM.  Glucose, Serum  141  BUN 12  Creatinine (comp) 1.17  Potassium, Serum 3.7  Chloride, Serum  95  CO2, Serum 29  Calcium (Total), Serum  7.7  Osmolality (calc) 267  eGFR (African American)  52  eGFR (Non-African American)  45 (eGFR values <665mmin/1.73 m2 may be an indication of chronic kidney disease (CKD). Calculated eGFR is useful in patients with stable renal function.  The eGFR calculation will not be reliable in acutely ill patients when serum creatinine is changing rapidly. It is not useful in  patients on dialysis. The eGFR calculation may not be applicable to patients at the low and high extremes of body sizes, pregnant women, and vegetarians.)  Anion Gap 8  Magnesium, Serum 1.8 (1.8-2.4 THERAPEUTIC RANGE: 4-7 mg/dL TOXIC: > 10 mg/dL  -----------------------)    05:11   Result Comment MET B - DUPLICATE ORDER - REFER TO GFQ#[id number]  Result(s) reported on 19 Aug 2013 at 06:37AM.  Glucose, Serum -  BUN -  Creatinine (comp) -  Sodium, Serum -  Potassium, Serum -  Chloride, Serum -  CO2, Serum -  Calcium (Total), Serum -  Osmolality (calc) -  eGFR (African American)  53  eGFR (Non-African American)  46 (eGFR values <58m/min/1.73 m2 may be an indication of chronic kidney disease  (CKD). Calculated eGFR is useful in patients with stable renal function. The eGFR calculation will not be reliable in acutely ill patients when serum creatinine is changing rapidly. It is not useful in  patients on dialysis. The eGFR calculation may not be applicable to patients at the low and high extremes of body sizes, pregnant women, and vegetarians.)  Anion Gap -  Phosphorus, Serum 2.7 (Result(s) reported on 19 Aug 2013 at 06:39AM.)    09:05   Result Comment - NOTIFIED OF CRITICAL VALUE  - READ-BACK PROCESS PERFORMED.  - Dr.Kasa at [facility] 08/19/13  - by gkm  Result(s) reported on 19 Aug 2013 at 09:10AM.  08-Aug-15 04:48   Result Comment LABS - This specimen was collected through an   - indwelling catheter or arterial line.  - A minimum of 520m of blood was wasted prior    - to collecting the sample.  Interpret  - results with caution.  Result(s) reported on 20 Aug 2013 at 05:04AM.  Glucose, Serum  247  BUN 11  Creatinine (comp) 1.07  Sodium, Serum  130  Potassium, Serum  2.9  Chloride, Serum  92  CO2, Serum 32  Calcium (Total), Serum  7.5  Osmolality (calc) 268  eGFR (African American)  58  eGFR (Non-African American)  50 (eGFR values <601min/1.73 m2 may be an indication of chronic kidney disease (CKD). Calculated eGFR is useful in patients with stable renal function. The eGFR calculation will not be reliable in acutely ill patients when serum creatinine is changing rapidly. It is not useful in  patients on dialysis. The eGFR calculation may not be applicable to patients at the low and high extremes of body sizes, pregnant women, and vegetarians.)  Anion Gap  6  Magnesium, Serum  1.5 (1.8-2.4 THERAPEUTIC RANGE: 4-7 mg/dL TOXIC: > 10 mg/dL  -----------------------)  Cardiac:  08-Aug-15 04:48   Troponin I < 0.02 (0.00-0.05 0.05 ng/mL or less: NEGATIVE  Repeat testing in 3-6 hrs  if clinically indicated. >0.05 ng/mL: POTENTIAL  MYOCARDIAL INJURY. Repeat   testing in 3-6 hrs if  clinically indicated. NOTE: An increase or decrease  of 30% or more on serial  testing suggests a  clinically important change)  Routine Hem:  07-Aug-15 05:04   WBC (CBC)  15.4  RBC (CBC)  3.63  Hemoglobin (CBC)  9.2  Hematocrit (CBC)  28.7  Platelet Count (CBC) 309  MCV  79  MCH  25.3  MCHC  31.9  RDW  16.2  Neutrophil % 80.0  Lymphocyte % 14.1  Monocyte % 4.7  Eosinophil % 0.7  Basophil %  0.5  Neutrophil #  12.3  Lymphocyte # 2.2  Monocyte # 0.7  Eosinophil # 0.1  Basophil # 0.1  08-Aug-15 04:48   WBC (CBC)  14.1  RBC (CBC)  3.21  Hemoglobin (CBC)  8.2  Hematocrit (CBC)  24.9  Platelet Count (CBC) 251  MCV  77  MCH  25.5  MCHC 32.9  RDW  16.2  Neutrophil % 80.7  Lymphocyte % 13.2  Monocyte % 4.7  Eosinophil % 1.1  Basophil % 0.3  Neutrophil #  11.4  Lymphocyte # 1.9  Monocyte # 0.7  Eosinophil # 0.2  Basophil # 0.0   Radiology Results: XRay:    06-Aug-15 01:51, Chest Portable Single View  Chest Portable Single View   REASON FOR EXAM:    intubation  COMMENTS:       PROCEDURE: DXR - DXR PORTABLE CHEST SINGLE VIEW  - Aug 18 2013  1:51AM     CLINICAL DATA:  Intubation.    EXAM:  PORTABLE CHEST - 1 VIEW    COMPARISON:  08/08/2013    FINDINGS:  Right mainstem intubation. Retraction by 4 cm would place the tip  between the clavicular heads and carina. The orogastric tube crosses  the diaphragm.    Normal heart size and upper mediastinal contours. Diffuse  interstitial coarsening with [employer]. No effusion or  pneumothorax. Symmetric biapical thickening.    These results were called by telephone at the time of interpretation  on 08/18/2013 at 1:55 am to [facility], who verbally acknowledged  these results.     IMPRESSION:  1. Right mainstem intubation. Recommend endotracheal tube retraction  by 4 cm.  2. Orogastric tube in good position.  3. Pulmonary edema.  Electronically Signed    By: Jorje Guild M.D.     On: 08/18/2013 01:56         Verified By: Gilford Silvius, M.D.,    06-Aug-15 04:54, Chest Portable Single View  Chest Portable Single View   REASON FOR EXAM:    LINE PLACEMENT  COMMENTS:       PROCEDURE: DXR - DXR PORTABLE CHEST SINGLE VIEW  - Aug 18 2013  4:54AM     CLINICAL DATA:  Central line placement    EXAM:  PORTABLE CHEST - 1 VIEW    COMPARISON:  Chest x-ray from the same day at 1:44 a.m.    FINDINGS:  Improved positioning of the endotracheal tube, now at the level of  the clavicular heads. The orogastric tube reaches the stomach at  least. New left IJ catheter, tip at the level of the distal SVC.    Unchanged heart size and upper mediastinal contours. New diffuse  perihilar airspace disease. Interstitial opacities persist. There  may be layering pleural fluid. No pneumothorax.     IMPRESSION:  1. New left IJ catheter is in good position.  No pneumothorax.  2. Endotrachealtube is in good position after shortening.  3. Worsening pulmonary edema.      Electronically Signed    By: Jorje Guild M.D.    On: 08/18/2013 04:58     Verified By: Gilford Silvius, M.D.,  Cardiology:    06-Aug-15 01:51, ED ECG  Ventricular Rate 93  Atrial Rate 76  QRS Duration 112  QT 394  QTc 489  R Axis 106  T Axis 64  ECG interpretation   Atrial fibrillation with premature ventricular or aberrantly conducted complexes  Low voltage QRS  Right bundle branch block  Septal infarct , age undetermined  Abnormal ECG  When compared with ECG of 08-Aug-2013 22:04,  Vent. rate has decreased BY  51 BPM  Right bundle branch block is now Present  ----------unconfirmed----------  Confirmed by OVERREAD, NOT ([id number]), editor PEARSON, BARBARA ([id number]) on 08/18/2013 8:57:18 AM  ED ECG     06-Aug-15 02:18, ECG  Ventricular Rate 121  Atrial Rate 121  P-R Interval 166  QRS Duration 68  QT 296  QTc 420  P Axis 75  R Axis 50  T Axis 57  ECG interpretation   Sinus tachycardia with Premature  supraventricular complexes and Fusion complexes  Low voltage QRS  Cannot rule out Anteroseptal infarct (cited on or before 18-Aug-2013)  Abnormal ECG  When compared with ECG of 18-Aug-2013 01:51,  Sinus rhythm has replaced Atrial fibrillation  Right bundle branch block is no longer Present  Questionable change in initial forces of Anterior leads  ----------unconfirmed----------  Confirmed by OVERREAD, NOT ([id number]), editor PEARSON, BARBARA ([id number]) on 08/18/2013 8:57:53 AM  ECG     06-Aug-15 04:38, ECG  Ventricular Rate 106  Atrial Rate 0  QRS Duration 78  QT 382  QTc 507  R Axis 71  T Axis 68  ECG interpretation   Atrial fibrillation with rapid ventricular response with premature ventricular or aberrantly conducted complexes  Low voltage QRS  Septal infarct (cited on or before 18-Aug-2013)  Abnormal ECG  When compared with ECG of 18-Aug-2013 02:18,  Atrial fibrillation has replaced Sinus rhythm  Questionable change in initial forces of Anterior leads  ----------unconfirmed----------  Confirmed by OVERREAD, NOT ([id number]), editor PEARSON, BARBARA ([id number]) on 08/18/2013 8:58:18 AM  ECG    Assessment/Plan:  Invasive Device Daily Assessment of Necessity:  Does the patient currently have any of the following indwelling devices? foley  central line catheter  ventilator   Indwelling Urinary Catheter continued, requirement due to   [employer] continued, requirement due to   Reason to continue [employer] For infusion of vasoactive drugs   Ventilator continued, requirement due to   Reason to continue Ventilator failed daily assessment of readiness to wean   Assessment/Plan:  Assessment IMP  acute respiratory failure  congestive heart failure  atrial fibrillation  Hypokalemia  anemia  status post cardiac arrest  diabetes  neuropathy  aspiration pneumonia .   Plan PLAN  continue critical care support  agree with ventilatory support  agree with Pulmonary input for vent management  and pneumonia care  continue IV antibiotics for aspiration pneumonia  agree with diuresis for possible congestive heart failure  rate control for atrial fibrillation  short-term anticoagulation for DVT prophylaxis atrial fibrillation possibility of pulmonary       embolus  doubt myocardial infarction this could still be possible ischemia  try to wean the patient from the event that would consider cardiac catheterization when       she stabilizes  relative hypotension I am concerned that aspiration pneumonia is playing a significant role        in her abnormal chest x-ray in addition to heart failure  recommend echocardiogram today for reassessment of LV function and valvular        structures  continue mild diuresis  continue pressors as necessary to maintain adequate blood pressure  will discuss the case with family and primary physician   Electronic Signatures: Lujean Amel D (MD)  (Signed 08-Aug-15 10:18)  Authored: Chief Complaint, VITAL SIGNS/ANCILLARY NOTES, Brief Assessment, Lab Results, Radiology Results, Assessment/Plan   Last  Updated: 08-Aug-15 10:18 by Lujean Amel D (MD)

## 2014-05-06 NOTE — Discharge Summary (Signed)
PATIENT NAME:  MELITA, VILLALONA MR#:  [id number] DATE OF BIRTH:  November 20, 1933  DATE OF ADMISSION:  08/18/2013 DATE OF DISCHARGE:  08/29/2013  DISCHARGE DIAGNOSES: 1.  Cardiac arrest.  2.  Uncontrolled atrial fibrillation.  3.  Pseudomonas pneumonia. 4.  Hypothyroid.  5.  Peripheral neuropathy.  6.  Diabetes mellitus, non-insulin-requiring.   DISCHARGE MEDICATIONS: 1.  PreserVision 1 b.i.d.  2.  Synthroid 112 mcg daily.  3.  Vitamin D3 1,000 units daily. 4.  Ferrous sulfate 325 mg daily. 5.  Aspirin 81 mg daily. 6.  Gabapentin 300 mg b.i.d. 7.  Simvastatin 40 mg at bedtime. 8.  Metoprolol tartrate 25 mg b.i.d. 9.  Lisinopril 5 mg daily. 10.  Amiodarone 200 mg daily. 11.  Glipizide 2.5 mg daily. 12.  Temazepam 15 mg at bedtime p.r.n. sleep. 13.  Liquid Colace 10 mL b.i.d.  14.  Pantoprazole 40 mg daily.  15.  Levaquin 500 mg daily x7 days.   REASON FOR ADMISSION: A 81 year old female who presents with cardiac arrest. Please see H and P for HPI, past medical history and physical exam.   HOSPITAL COURSE: The patient was admitted and post resuscitation she did not rule in for a MI. She was hypotensive for quite some time requiring vasopressin to support her blood pressure. She was intubated and found to have pneumonia on chest x-ray. She was treated with Zosyn IV. Initially the thought was that she was overmedicated as many of her Toprol-XL pills were scattered throughout the house. It became the likely etiology when the heart catheterization showed normal EF, 50% LAD and collateralization to a total right from the left, but really no sign of tight occlusive disease requiring intervention. For her rapid A-fib, she was treated with amiodarone and metoprolol. It is clear that she cannot manage her own medications by herself and will need help long term. With multiple falls and her normal EF, it was thought that Coumadin was not a good idea and aspirin was continued. She remained moderately  anemic, hemoglobin stable, sugars reasonably controlled. Her pseudomonas pneumonia was treated with meropenem IV and switched to p.o. Levaquin on discharge. Overall prognosis is guarded with her multiple problems.   ____________________________ Rusty Aus, MD mfm:sb D: 08/29/2013 07:16:36 ET T: 08/29/2013 07:37:33 ET JOB#: [id number]  cc: Rusty Aus, MD, <Dictator> Rusty Aus MD ELECTRONICALLY SIGNED 08/29/2013 8:07

## 2014-05-06 NOTE — Consult Note (Signed)
Chief Complaint:  Subjective/Chief Complaint Pt is intubated but with anxiety. Denies cp still sob. No f/c/s.   VITAL SIGNS/ANCILLARY NOTES: **Vital Signs.:   09-Aug-15 12:00  Vital Signs Type Routine  Temperature Temperature (F) 98.9  Celsius 37.1  Temperature Source oral  Pulse Pulse 117  Pulse source if not from Vital Sign Device per cardiac monitor  Respirations Respirations 19  Systolic BP Systolic BP 124  Diastolic BP (mmHg) Diastolic BP (mmHg) 54  Mean BP 77  BP Source  if not from Vital Sign Device non-invasive  Pulse Ox % Pulse Ox % 97  Pulse Ox Activity Level  At rest  Oxygen Delivery Ventilator Assisted; Ac 18, 35% fio2  *Intake and Output.:   09-Aug-15 14:00  Grand Totals Intake:  133.3 Output:      Net:  133.3 24 Hr.:  -1337.8  Enteral feeding ml     In:  68  Enteral Feeding flush intake      In:  25  IV (Primary)      In:  7  IV (Primary)      In:  33.3   Brief Assessment:  GEN well developed, well nourished, no acute distress   Cardiac Regular  Irregular  murmur present  + carotid bruits  --Rub   Respiratory normal resp effort  clear BS   Gastrointestinal Normal   Gastrointestinal details normal Soft  Nontender   EXTR negative cyanosis/clubbing, negative edema   Lab Results: Hepatic:  08-Aug-15 04:48   Bilirubin, Total 0.6  Alkaline Phosphatase 80 (46-116 NOTE: New Reference Range 08/02/13)  SGPT (ALT) 18 (14-63 NOTE: New Reference Range 08/02/13)  SGOT (AST) 28  Total Protein, Serum  5.4  Albumin, Serum  2.0  Lab:  08-Aug-15 14:00   pH (ABG) 7.44 (7.35-7.45 NOTE: New Reference Range 08/06/13)  PCO2 43 (32-48 NOTE: New Reference Range 08/06/13)  PO2  72 (83-108 NOTE: New Reference Range 08/06/13)  FiO2 40  Base Excess  4.4 (-2.0-3.0 NOTE: New Reference Range 08/06/13)  HCO3  29.2 (21-28 NOTE: New Reference Range 08/06/13)  O2 Saturation 96.1  O2 Device 840  Specimen Site (ABG) RT RADIAL  Specimen Type (ABG) ARTERIAL  Patient  Temp (ABG) 37.0  Mode ASSIST CONTROL  Vt 400  PEEP 5.0  Mechanical Rate 18 (Result(s) reported on 20 Aug 2013 at 02:05PM.)  Routine Chem:  08-Aug-15 04:48   Potassium, Serum  2.9  Magnesium, Serum  1.5 (1.8-2.4 THERAPEUTIC RANGE: 4-7 mg/dL TOXIC: > 10 mg/dL  -----------------------)  Glucose, Serum  247  BUN 11  Creatinine (comp) 1.07  Sodium, Serum  130  Chloride, Serum  92  CO2, Serum 32  Calcium (Total), Serum  7.5  Anion Gap  6  Osmolality (calc) 268  eGFR (African American)  58  eGFR (Non-African American)  50 (eGFR values <13mL/min/1.73 m2 may be an indication of chronic kidney disease (CKD). Calculated eGFR is useful in patients with stable renal function. The eGFR calculation will not be reliable in acutely ill patients when serum creatinine is changing rapidly. It is not useful in  patients on dialysis. The eGFR calculation may not be applicable to patients at the low and high extremes of body sizes, pregnant women, and vegetarians.)  Result Comment LABS - This specimen was collected through an   - indwelling catheter or arterial line.  - A minimum of of blood was wasted prior    - to collecting the sample.  Interpret  - results with caution.  Result(s) reported on 20 Aug 2013 at 05:04AM.    11:17   Potassium, Serum  3.1  Magnesium, Serum 2.3 (1.8-2.4 THERAPEUTIC RANGE: 4-7 mg/dL TOXIC: > 10 mg/dL  -----------------------)  Result Comment ALL LABS - This specimen was collected through an   - indwelling catheter or arterial line.  - A minimum of 55mls of blood was wasted prior    - to collecting the sample.  Interpret  - results with caution.  Result(s) reported on 20 Aug 2013 at 11:43AM.    17:39   Potassium, Serum 3.7  Result Comment - This specimen was collected through an   - indwelling catheter or arterial line.  - A minimum of 41mls of blood was wasted prior    - to collecting the sample.  Interpret  - results with caution.  Result(s) reported  on 20 Aug 2013 at 06:04PM.  09-Aug-15 03:39   Potassium, Serum  3.4  Phosphorus, Serum  1.1 (Result(s) reported on 21 Aug 2013 at 04:07AM.)  Magnesium, Serum 2.0 (1.8-2.4 THERAPEUTIC RANGE: 4-7 mg/dL TOXIC: > 10 mg/dL  -----------------------)  Glucose, Serum  230  BUN 13  Creatinine (comp) 1.03  Sodium, Serum  134  Chloride, Serum 99  CO2, Serum 25  Calcium (Total), Serum  7.6  Anion Gap 10  Osmolality (calc) 276  eGFR (African American) >60  eGFR (Non-African American)  52 (eGFR values <6mL/min/1.73 m2 may be an indication of chronic kidney disease (CKD). Calculated eGFR is useful in patients with stable renal function. The eGFR calculation will not be reliable in acutely ill patients when serum creatinine is changing rapidly. It is not useful in  patients on dialysis. The eGFR calculation may not be applicable to patients at the low and high extremes of body sizes, pregnant women, and vegetarians.)  Result Comment LABS - This specimen was collected through an   - indwelling catheter or arterial line.  - A minimum of 38mls of blood was wasted prior    - to collecting the sample.  Interpret  - results with caution.  Result(s) reported on 21 Aug 2013 at 04:07AM.    09:46   Potassium, Serum 3.9 (Result(s) reported on 21 Aug 2013 at 10:15AM.)  Phosphorus, Serum 2.5 (Result(s) reported on 21 Aug 2013 at 10:17AM.)  Cardiac:  08-Aug-15 04:48   Troponin I < 0.02 (0.00-0.05 0.05 ng/mL or less: NEGATIVE  Repeat testing in 3-6 hrs  if clinically indicated. >0.05 ng/mL: POTENTIAL  MYOCARDIAL INJURY. Repeat  testing in 3-6 hrs if  clinically indicated. NOTE: An increase or decrease  of 30% or more on serial  testing suggests a  clinically important change)  Routine Hem:  08-Aug-15 04:48   WBC (CBC)  14.1  RBC (CBC)  3.21  Hemoglobin (CBC)  8.2  Hematocrit (CBC)  24.9  Platelet Count (CBC) 251  MCV  77  MCH  25.5  MCHC 32.9  RDW  16.2  Neutrophil % 80.7  Lymphocyte %  13.2  Monocyte % 4.7  Eosinophil % 1.1  Basophil % 0.3  Neutrophil #  11.4  Lymphocyte # 1.9  Monocyte # 0.7  Eosinophil # 0.2  Basophil # 0.0  09-Aug-15 03:39   WBC (CBC) 11.0  RBC (CBC)  2.84  Hemoglobin (CBC)  7.2  Hematocrit (CBC)  22.4  Platelet Count (CBC) 211  MCV  79  MCH  25.4  MCHC 32.2  RDW  16.0  Neutrophil % 83.5  Lymphocyte % 8.4  Monocyte % 6.2  Eosinophil % 1.7  Basophil % 0.2  Neutrophil #  9.2  Lymphocyte #  0.9  Monocyte # 0.7  Eosinophil # 0.2  Basophil # 0.0 (Result(s) reported on 21 Aug 2013 at 04:02AM.)   Radiology Results: XRay:    06-Aug-15 01:51, Chest Portable Single View  Chest Portable Single View   REASON FOR EXAM:    intubation  COMMENTS:       PROCEDURE: DXR - DXR PORTABLE CHEST SINGLE VIEW  - Aug 18 2013  1:51AM     CLINICAL DATA:  Intubation.    EXAM:  PORTABLE CHEST - 1 VIEW    COMPARISON:  08/08/2013    FINDINGS:  Right mainstem intubation. Retraction by 4 cm would place the tip  between the clavicular heads and carina. The orogastric tube crosses  the diaphragm.    Normal heart size and upper mediastinal contours. Diffuse  interstitial coarsening with [employer]. No effusion or  pneumothorax. Symmetric biapical thickening.    These results were called by telephone at the time of interpretation  on 08/18/2013 at 1:55 am to [facility], who verbally acknowledged  these results.     IMPRESSION:  1. Right mainstem intubation. Recommend endotracheal tube retraction  by 4 cm.  2. Orogastric tube in good position.  3. Pulmonary edema.  Electronically Signed    By: Jorje Guild M.D.    On: 08/18/2013 01:56         Verified By: Gilford Silvius, M.D.,    06-Aug-15 04:54, Chest Portable Single View  Chest Portable Single View   REASON FOR EXAM:    LINE PLACEMENT  COMMENTS:       PROCEDURE: DXR - DXR PORTABLE CHEST SINGLE VIEW  - Aug 18 2013  4:54AM     CLINICAL DATA:  Central line  placement    EXAM:  PORTABLE CHEST - 1 VIEW    COMPARISON:  Chest x-ray from the same day at 1:44 a.m.    FINDINGS:  Improved positioning of the endotracheal tube, now at the level of  the clavicular heads. The orogastric tube reaches the stomach at  least. New left IJ catheter, tip at the level of the distal SVC.    Unchanged heart size and upper mediastinal contours. New diffuse  perihilar airspace disease. Interstitial opacities persist. There  may be layering pleural fluid. No pneumothorax.     IMPRESSION:  1. New left IJ catheter is in good position.  No pneumothorax.  2. Endotrachealtube is in good position after shortening.  3. Worsening pulmonary edema.      Electronically Signed    By: Jorje Guild M.D.    On: 08/18/2013 04:58     Verified By: Gilford Silvius, M.D.,    08-Aug-15 10:05, Chest Portable Single View  Chest Portable Single View   REASON FOR EXAM:    pt being taken off vent  COMMENTS:       PROCEDURE: DXR - DXR PORTABLE CHEST SINGLE VIEW  - Aug 20 2013 10:05AM     CLINICAL DATA:  Respiratory failure.    EXAM:  PORTABLE CHEST - 1 VIEW    COMPARISON:  08/18/2013    FINDINGS:  Endotracheal tube tip is approximately 4 cm above the carina. Left  jugular central line shows stable positioning. Pulmonary edema  improved. No pneumothorax or significant pleural fluid. The heart  size is stable.     IMPRESSION:  Improving pulmonary edema.  Electronically Signed    By: Aletta Edouard M.D.    On: 08/20/2013 10:47         Verified By: Azzie Roup, M.D.,    09-Aug-15 14:38, Chest Portable Single View  Chest Portable Single View   REASON FOR EXAM:    ventilator  COMMENTS:       PROCEDURE: DXR - DXR PORTABLE CHEST SINGLE VIEW  - Aug 21 2013  2:38PM     CLINICAL DATA:  Respiratory failure, possible extubation    EXAM:  PORTABLE CHEST - 1 VIEW    COMPARISON:  08/20/2013    FINDINGS:  Endotracheal tube terminates 4.5 cm above  the carina.  Multifocal patchy opacities, possibly reflecting multifocal  pneumonia versus asymmetric pulmonary edema, right upper lobe  predominant. Possible small pleural effusions. No pneumothorax.    Overall appearance is mildly improved.    The heart is normal in size.    Left IJ venous catheter terminating in the lower SVC.    Enteric tube terminates in the stomach.     IMPRESSION:  Endotracheal tube terminates 4.5 cm above the carina.  Multifocal patchy opacities, possibly reflecting multifocal  pneumonia or asymmetric pulmonary edema, right upper lobe  predominant. Possible small pleural effusions.    Overall appearance is mildly improved.      Electronically Signed    By: Julian Hy M.D.    On: 08/21/2013 14:48         Verified By: Julian Hy, M.D.,  Cardiology:    06-Aug-15 01:51, ED ECG  Ventricular Rate 93  Atrial Rate 76  QRS Duration 112  QT 394  QTc 489  R Axis 106  T Axis 64  ECG interpretation   Atrial fibrillation with premature ventricular or aberrantly conducted complexes  Low voltage QRS  Right bundle branch block  Septal infarct , age undetermined  Abnormal ECG  When compared with ECG of 08-Aug-2013 22:04,  Vent. rate has decreased BY  51 BPM  Right bundle branch block is now Present  ----------unconfirmed----------  Confirmed by OVERREAD, NOT ([id number]), editor PEARSON, BARBARA ([id number]) on 08/18/2013 8:57:18 AM  ED ECG     06-Aug-15 02:18, ECG  Ventricular Rate 121  Atrial Rate 121  P-R Interval 166  QRS Duration 68  QT 296  QTc 420  P Axis 75  R Axis 50  T Axis 57  ECG interpretation   Sinus tachycardia with Premature supraventricular complexes and Fusion complexes  Low voltage QRS  Cannot rule out Anteroseptal infarct (cited on or before 18-Aug-2013)  Abnormal ECG  When compared with ECG of 18-Aug-2013 01:51,  Sinus rhythm has replaced Atrial fibrillation  Right bundle branch block is no longer Present  Questionable  change in initial forces of Anterior leads  ----------unconfirmed----------  Confirmed by OVERREAD, NOT ([id number]), editor PEARSON, BARBARA ([id number]) on 08/18/2013 8:57:53 AM  ECG     06-Aug-15 04:38, ECG  Ventricular Rate 106  Atrial Rate 0  QRS Duration 78  QT 382  QTc 507  R Axis 47  T Axis 68  ECG interpretation   Atrial fibrillation with rapid ventricular response with premature ventricular or aberrantly conducted complexes  Low voltage QRS  Septal infarct (cited on or before 18-Aug-2013)  Abnormal ECG  When compared with ECG of 18-Aug-2013 02:18,  Atrial fibrillation has replaced Sinus rhythm  Questionable change in initial forces of Anterior leads  ----------unconfirmed----------  Confirmed by OVERREAD, NOT ([id number]), editor PEARSON, BARBARA ([id number]) on 08/18/2013 8:58:18  AM  ECG    Assessment/Plan:  Assessment/Plan:  Assessment IMP Resp failure CHF SOB AFIB Anemia Anxiety .   Plan PLAN Continue vent support Agree with pulmonary input Lasix with IV BID Antibx IV Add Amiodarone IV load Add digoxin IV load then 0.25 daily Consider Metoporolol/Ditiazem for rate also Benzos for anxiety Anticoug for AFIB/DVT W/U anemia ? GI bleeding Repeat ECHO   Electronic Signatures: Lujean Amel D (MD)  (Signed 09-Aug-15 16:26)  Authored: Chief Complaint, VITAL SIGNS/ANCILLARY NOTES, Brief Assessment, Lab Results, Radiology Results, Assessment/Plan   Last Updated: 09-Aug-15 16:26 by Lujean Amel D (MD)

## 2014-05-06 NOTE — Consult Note (Signed)
Chief Complaint:  Subjective/Chief Complaint Intubated sedated.   VITAL SIGNS/ANCILLARY NOTES: **Vital Signs.:   10-Aug-15 14:00  Temperature Temperature (F) 98.8  Celsius 37.1  Temperature Source oral  Pulse Pulse 102  Respirations Respirations 32  Systolic BP Systolic BP 91  Diastolic BP (mmHg) Diastolic BP (mmHg) 60  Mean BP 70  Pulse Ox % Pulse Ox % 98  Pulse Ox Activity Level  At rest  Oxygen Delivery Ventilator Assisted  *Intake and Output.:   10-Aug-15 13:50  Grand Totals Intake:  0 Output:  1500    Net:  -1500 24 Hr.:  -645.4  IV (Primary)      In:  0  Urine ml     Out:  1500  Urinary Method  Foley   Brief Assessment:  GEN well developed, well nourished, no acute distress   Cardiac Regular  Irregular  murmur present  + carotid bruits  --Rub   Respiratory normal resp effort  clear BS   Gastrointestinal Normal   Gastrointestinal details normal Soft  Nontender   EXTR negative cyanosis/clubbing, negative edema   Lab Results: Hepatic:  11-Aug-15 03:03   Bilirubin, Total 0.3  Alkaline Phosphatase 87 (46-116 NOTE: New Reference Range 08/02/13)  SGPT (ALT)  11 (14-63 NOTE: New Reference Range 08/02/13)  SGOT (AST) 19  Total Protein, Serum 6.7  Albumin, Serum  2.0  Lab:  11-Aug-15 10:37   pH (ABG) 7.420 (7.350-7.450 NOTE: New Reference Range 08/06/13)  PCO2 40 (32-48 NOTE: New Reference Range 08/23/13)  PO2 98 (83-108 NOTE: New Reference Range 08/06/13)  FiO2 30  Base Excess 0 (-3-3 NOTE: New Reference Range 08/23/13)  HCO3 25.9 (21.0-28.0 NOTE: New Reference Range 08/06/13)  O2 Saturation 97.0  O2 Device 840  Specimen Site (ABG) LT BRACHIAL  Specimen Type (ABG) ARTERIAL  PSV 10  PEEP 5.0  CPAP 0.5  %FiO2 30.0  Mechanical Rate 000 (Result(s) reported on 23 Aug 2013 at 10:51AM.)  Routine Chem:  11-Aug-15 03:03   Magnesium, Serum 2.4 (1.8-2.4 THERAPEUTIC RANGE: 4-7 mg/dL TOXIC: > 10 mg/dL  -----------------------)  Phosphorus, Serum 2.7  (Result(s) reported on 23 Aug 2013 at 03:38AM.)  Glucose, Serum  176  BUN  26  Creatinine (comp)  1.32  Sodium, Serum  135  Potassium, Serum  3.3  Chloride, Serum  96  CO2, Serum 26  Calcium (Total), Serum  8.2  Anion Gap 13  Osmolality (calc) 279  eGFR (African American)  45  eGFR (Non-African American)  39 (eGFR values <62m/min/1.73 m2 may be an indication of chronic kidney disease (CKD). Calculated eGFR is useful in patients with stable renal function. The eGFR calculation will not be reliable in acutely ill patients when serum creatinine is changing rapidly. It is not useful in  patients on dialysis. The eGFR calculation may not be applicable to patients at the low and high extremes of body sizes, pregnant women, and vegetarians.)    13:47   Potassium, Serum 4.6 (Result(s) reported on 23 Aug 2013 at 02:10PM.)  12-Aug-15 03:03   Magnesium, Serum  2.5 (1.8-2.4 THERAPEUTIC RANGE: 4-7 mg/dL TOXIC: > 10 mg/dL  -----------------------)  Phosphorus, Serum 2.5 (Result(s) reported on 24 Aug 2013 at 03:29AM.)  Glucose, Serum  201  BUN  25  Creatinine (comp) 1.10  Sodium, Serum 140  Potassium, Serum 4.2  Chloride, Serum 103  CO2, Serum 28  Calcium (Total), Serum 8.8  Anion Gap 9  Osmolality (calc) 289  eGFR (African American)  56  eGFR (Non-African American)  48 (eGFR values <3m/min/1.73 m2 may be an indication of chronic kidney disease (CKD). Calculated eGFR is useful in patients with stable renal function. The eGFR calculation will not be reliable in acutely ill patients when serum creatinine is changing rapidly. It is not useful in  patients on dialysis. The eGFR calculation may not be applicable to patients at the low and high extremes of body sizes, pregnant women, and vegetarians.)  Routine Hem:  11-Aug-15 03:03   WBC (CBC) 9.7  RBC (CBC)  3.14  Hemoglobin (CBC)  7.8  Hematocrit (CBC)  24.7  Platelet Count (CBC) 304  MCV  79  MCH  24.8  MCHC  31.5  RDW  16.4   Neutrophil % 71.8  Lymphocyte % 11.8  Monocyte % 13.5  Eosinophil % 2.1  Basophil % 0.8  Neutrophil #  7.0  Lymphocyte # 1.1  Monocyte #  1.3  Eosinophil # 0.2  Basophil # 0.1 (Result(s) reported on 23 Aug 2013 at 03:30AM.)  12-Aug-15 03:03   WBC (CBC) 10.2  RBC (CBC)  3.50  Hemoglobin (CBC)  8.8  Hematocrit (CBC)  27.3  Platelet Count (CBC) 364  MCV  78  MCH  25.1  MCHC 32.1  RDW  16.5  Neutrophil % 66.6  Lymphocyte % 16.7  Monocyte % 12.4  Eosinophil % 3.2  Basophil % 1.1  Neutrophil #  6.8  Lymphocyte # 1.7  Monocyte #  1.3  Eosinophil # 0.3  Basophil # 0.1 (Result(s) reported on 24 Aug 2013 at 03:52AM.)   Radiology Results: XRay:    06-Aug-15 01:51, Chest Portable Single View  Chest Portable Single View   REASON FOR EXAM:    intubation  COMMENTS:       PROCEDURE: DXR - DXR PORTABLE CHEST SINGLE VIEW  - Aug 18 2013  1:51AM     CLINICAL DATA:  Intubation.    EXAM:  PORTABLE CHEST - 1 VIEW    COMPARISON:  08/08/2013    FINDINGS:  Right mainstem intubation. Retraction by 4 cm would place the tip  between the clavicular heads and carina. The orogastric tube crosses  the diaphragm.    Normal heart size and upper mediastinal contours. Diffuse  interstitial coarsening with [employer] No effusion or  pneumothorax. Symmetric biapical thickening.    These results were called by telephone at the time of interpretation  on 08/18/2013 at 1:55 am [facility] who verbally acknowledged  these results.     IMPRESSION:  1. Right mainstem intubation. Recommend endotracheal tube retraction  by 4 cm.  2. Orogastric tube in good position.  3. Pulmonary edema.  Electronically Signed    By: JJorje GuildM.D.    On: 08/18/2013 01:56         Verified By: JGilford Silvius M.D.,    06-Aug-15 04:54, Chest Portable Single View  Chest Portable Single View   REASON FOR EXAM:    LINE PLACEMENT  COMMENTS:       PROCEDURE: DXR - DXR PORTABLE CHEST SINGLE VIEW   - Aug 18 2013  4:54AM     CLINICAL DATA:  Central line placement    EXAM:  PORTABLE CHEST - 1 VIEW    COMPARISON:  Chest x-ray from the same day at 1:44 a.m.    FINDINGS:  Improved positioning of the endotracheal tube, now at the level of  the clavicular heads. The orogastric tube reaches the stomach at  least. New left IJ catheter, tip  at the level of the distal SVC.    Unchanged heart size and upper mediastinal contours. New diffuse  perihilar airspace disease. Interstitial opacities persist. There  may be layering pleural fluid. No pneumothorax.     IMPRESSION:  1. New left IJ catheter is in good position.  No pneumothorax.  2. Endotrachealtube is in good position after shortening.  3. Worsening pulmonary edema.      Electronically Signed    By: Jorje Guild M.D.    On: 08/18/2013 04:58     Verified By: Gilford Silvius, M.D.,    08-Aug-15 10:05, Chest Portable Single View  Chest Portable Single View   REASON FOR EXAM:    pt being taken off vent  COMMENTS:       PROCEDURE: DXR - DXR PORTABLE CHEST SINGLE VIEW  - Aug 20 2013 10:05AM     CLINICAL DATA:  Respiratory failure.    EXAM:  PORTABLE CHEST - 1 VIEW    COMPARISON:  08/18/2013    FINDINGS:  Endotracheal tube tip is approximately 4 cm above the carina. Left  jugular central line shows stable positioning. Pulmonary edema  improved. No pneumothorax or significant pleural fluid. The heart  size is stable.     IMPRESSION:  Improving pulmonary edema.      Electronically Signed    By: Aletta Edouard M.D.    On: 08/20/2013 10:47         Verified By: Azzie Roup, M.D.,    09-Aug-15 14:38, Chest Portable Single View  Chest Portable Single View   REASON FOR EXAM:    ventilator  COMMENTS:       PROCEDURE: DXR - DXR PORTABLE CHEST SINGLE VIEW  - Aug 21 2013  2:38PM     CLINICAL DATA:  Respiratory failure, possible extubation    EXAM:  PORTABLE CHEST - 1 VIEW    COMPARISON:   08/20/2013    FINDINGS:  Endotracheal tube terminates 4.5 cm above the carina.  Multifocal patchy opacities, possibly reflecting multifocal  pneumonia versus asymmetric pulmonary edema, right upper lobe  predominant. Possible small pleural effusions. No pneumothorax.    Overall appearance is mildly improved.    The heart is normal in size.    Left IJ venous catheter terminating in the lower SVC.    Enteric tube terminates in the stomach.     IMPRESSION:  Endotracheal tube terminates 4.5 cm above the carina.  Multifocal patchy opacities, possibly reflecting multifocal  pneumonia or asymmetric pulmonary edema, right upper lobe  predominant. Possible small pleural effusions.    Overall appearance is mildly improved.      Electronically Signed    By: Julian Hy M.D.    On: 08/21/2013 14:48         Verified By: Julian Hy, M.D.,    10-Aug-15 06:39, Chest Portable Single View  Chest Portable Single View   REASON FOR EXAM:    assess for possible ventilator weaning trial  COMMENTS:       PROCEDURE: DXR - DXR PORTABLE CHEST SINGLE VIEW  - Aug 22 2013  6:39AM     CLINICAL DATA:  Assess for possible ventilator weaning.    EXAM:  PORTABLE CHEST - 1 VIEW    COMPARISON:  08/21/2013.    FINDINGS:  Endotracheal tube terminates 2.3 cm above the carina. Nasogastric  tube is followed into the stomach with the tip projecting beyond the  inferior margin of the image. Left IJ central  line tip projects over  the SVC. Heart size stable. Right hilum is prominent. Mild diffuse  interstitial prominence and indistinctness persist. Biapical pleural  thickening. Possible tiny bilateral effusions.     IMPRESSION:  1. Mild interstitial prominence and indistinctness, likely  reflecting edema, stable.  2. Right hilar prominence. Mass or adenopathy cannot be excluded.  Continued attention on followup exams is warranted.  3. Probable tiny bilateral  effusions.      Electronically Signed    By: Lorin Picket M.D.    On: 08/22/2013 08:12         Verified By: Luretha Rued, M.D.,  Cardiology:    06-Aug-15 01:51, ED ECG  Ventricular Rate 93  Atrial Rate 76  QRS Duration 112  QT 394  QTc 489  R Axis 106  T Axis 64  ECG interpretation   Atrial fibrillation with premature ventricular or aberrantly conducted complexes  Low voltage QRS  Right bundle branch block  Septal infarct , age undetermined  Abnormal ECG  When compared with ECG of 08-Aug-2013 22:04,  Vent. rate has decreased BY  51 BPM  Right bundle branch block is now Present  ----------unconfirmed----------  Confirmed by OVERREAD, NOT ([id number]), editor PEARSON, BARBARA ([id number]) on 08/18/2013 8:57:18 AM  ED ECG     06-Aug-15 02:18, ECG  Ventricular Rate 121  Atrial Rate 121  P-R Interval 166  QRS Duration 68  QT 296  QTc 420  P Axis 75  R Axis 50  T Axis 57  ECG interpretation   Sinus tachycardia with Premature supraventricular complexes and Fusion complexes  Low voltage QRS  Cannot rule out Anteroseptal infarct (cited on or before 18-Aug-2013)  Abnormal ECG  When compared with ECG of 18-Aug-2013 01:51,  Sinus rhythm has replaced Atrial fibrillation  Right bundle branch block is no longer Present  Questionable change in initial forces of Anterior leads  ----------unconfirmed----------  Confirmed by OVERREAD, NOT ([id number]), editor PEARSON, BARBARA ([id number]) on 08/18/2013 8:57:53 AM  ECG     06-Aug-15 04:38, ECG  Ventricular Rate 106  Atrial Rate 0  QRS Duration 78  QT 382  QTc 507  R Axis 47  T Axis 68  ECG interpretation   Atrial fibrillation with rapid ventricular response with premature ventricular or aberrantly conducted complexes  Low voltage QRS  Septal infarct (cited on or before 18-Aug-2013)  Abnormal ECG  When compared with ECG of 18-Aug-2013 02:18,  Atrial fibrillation has replaced Sinus rhythm  Questionable change in initial forces of Anterior  leads  ----------unconfirmed----------  Confirmed by OVERREAD, NOT (100), editor PEARSON, BARBARA ([id number]) on 08/18/2013 8:58:18 AM  ECG    Assessment/Plan:  Invasive Device Daily Assessment of Necessity:  Does the patient currently have any of the following indwelling devices? foley  ventilator   Indwelling Urinary Catheter continued, requirement due to   Reason to continue Indwelling Urinary Catheter immobilization due to sedation/paralysis   Ventilator continued, requirement due to   Assessment/Plan:  Assessment IMP Resp failure CHF SOB AFIB Anemia Anxiety  elevated blood sugar .   Plan PLAN  recommend weaning from ventilation when possible Agree with pulmonary input  IV diuretic therapy for heart failure Antibx IV  with possible pneumonia or bronchitis   Continue amiodarone therapy for AFib  continued digoxin for afib rate control hold off on beta-blockers and calcium blockers because of hypotension Benzos for anxiety Anticoug for AFIB/DVT W/U anemia ? GI bleeding   Electronic Signatures: Lujean Amel D (MD)  (Signed 12-Aug-15  20:74)  Authored: Chief Complaint, VITAL SIGNS/ANCILLARY NOTES, Brief Assessment, Lab Results, Radiology Results, Assessment/Plan   Last Updated: 12-Aug-15 20:37 by Lujean Amel D (MD)

## 2014-05-14 NOTE — Op Note (Signed)
PATIENT NAME:  Sharon [name], Sharon [name] MR#:  [id number] DATE OF BIRTH:  [birth year]  DATE OF PROCEDURE:  04/13/2014   PREOPERATIVE DIAGNOSES:  1.  Aortoiliac atherosclerosis.  2.  Peripheral arterial disease with ulceration, left lower extremity.  3.  Peripheral arterial disease with rest pain, right lower extremity.  4.  Diabetes.  5.  Ulcer of the left toes.   POSTOPERATIVE DIAGNOSES:    1.  Aortoiliac atherosclerosis.  2.  Peripheral arterial disease with ulceration, left lower extremity.  3.  Peripheral arterial disease with rest pain, right lower extremity.  4.  Diabetes.  5.  Ulcer of the left toes.   PROCEDURES:  1.  Ultrasound guidance for vascular access to bilateral femoral arteries.  2.  Catheter placement into left common femoral artery from right femoral approach.  3.  Placement of catheter to aorta from left femoral approach.  4.  Aortogram and selective left lower extremity angiogram.  5.  Percutaneous transluminal angioplasty of distal aorta and bilateral common iliac arteries with 7 mm diameter Lutonix drug-coated angioplasty balloon.  8.  Balloon expandable stent placement to distal aorta and bilateral common iliac arteries for greater than 50% residual stenosis and dissection after angioplasty.  9.  StarClose closure device, bilateral femoral artery.   SURGEON: Algernon Huxley, MD   ANESTHESIA: Local with moderate conscious sedation.   ESTIMATED BLOOD LOSS: 25 mL.   INDICATION FOR PROCEDURE: This is a 81 year old individual with diabetes. She has developed ulcerations of the left foot on 2 toes.  She has pain in both feet.  She has ABIs which were reduced, but not dramatically so; however, medial calcification was present falsely elevating the results.  We knew by previous duplex that she had significant atherosclerosis of both lower extremities in the aortoiliac segments. She is brought in for angiography initially with plans to revascularize the left lower extremity and left  iliac disease was present and then both lower extremities will be treated.  The left lower extremity having ulcerations is more critical of the 2 legs.  Risks and benefits of the procedure were discussed and informed consent was obtained.   DESCRIPTION OF PROCEDURE: The patient is brought to the vascular suite. Groins were shaved and prepped and a sterile surgical field was created. The ultrasound was first used to visualize a patent right femoral artery.  This was accessed under direct ultrasound guidance without difficulty with a Seldinger needle. A J-wire and 5 French sheath were placed.  A wire did not pass easily into the aorta and imaging through the right femoral sheath and subsequently from the aortogram showed a high-grade stenosis in the right common iliac artery. The distal aorta was heavily diseased with disease filling into both common iliac arteries.  This was a little more high-grade on the right than the left. The left was more in a moderate 60% range, but the right was more in the 90% range.  Given the fact that she had significant ulceration of the left lower extremity, I elected to go ahead and cross this bifurcation and advanced to the left femoral head with a rim catheter and advantage wire and perform imaging to see if any infrainguinal disease needed treatment.  The catheter was advanced without difficulty and imaging was performed.  The SFA had 3 areas of stenosis of less than 50% at the proximal to mid segments, none of which appeared flow limiting. The profunda femoris artery is patent. The popliteal artery is patent. She then had a  large anterior tibial artery that was continuous and normal all the way into the foot.  She also had a peroneal artery giving 2 vessel runoff distally.  No infrainguinal lesions needed treatment and I elected just to treat the distal aorta and common iliac arteries for her significant stenosis there.  There is likely a significant component of  atheroembolization from this distal aortic lesion to the left foot causing the ulcerations as well.  I then used ultrasound to access the left femoral artery. This was done without difficulty under direct ultrasound guidance and again a permanent image was recorded as it was on the right.  A 6 French sheath was placed on the left.  We upsized to a 6 [place] sheath on the right.  Wires were placed in the aorta from each side at this point.  The patient was given 4000 units of intravenous heparin.  I selected a 7 mm diameter, about 4 cm length Lutonix drug-coated balloons.  These were inflated to 12 atmospheres in the distal aorta and proximal common iliac arteries in a kissing balloon fashion.  The balloon was then deflated. A pigtail catheter was placed back up the left into the aorta from the left femoral sheath and angiogram was performed. This showed significant residual disease with dissection and greater than 50% residual stenosis, worse in the right common iliac than the left again; however, given the location and nature of the disease, kissing balloon stents were required to fully treat this. The right lesion was worse, but the left was also still present and to avoid compromising the left common iliac, kissing balloon stents were selected.  I selected an 8 mm diameter x 39 mm length balloon expandable stents.  These were deployed about 1 cm into the aorta reconstructing the distal aorta and then into the common iliac arteries proximally bilaterally. This encompassed the lesion. The balloons were inflated to 14 atmospheres which was burst pressure, and completion angiogram showed these now to be widely patent with brisk flow. I then removed the sheath. StarClose closure device was deployed in both femoral arteries with excellent hemostatic result. The patient tolerated the procedure well and was taken to the recovery room in stable condition.      ____________________________ Algernon Huxley,  MD jsd:DT D: 04/13/2014 16:00:13 ET T: 04/13/2014 16:32:42 ET JOB#: [id number]  cc: Algernon Huxley, MD, <Dictator> Rusty Aus, MD  Algernon Huxley MD ELECTRONICALLY SIGNED 04/17/2014 15:52

## 2014-05-31 DIAGNOSIS — E119 Type 2 diabetes mellitus without complications: Secondary | ICD-10-CM

## 2014-05-31 DIAGNOSIS — IMO0002 Reserved for concepts with insufficient information to code with codable children: Secondary | ICD-10-CM | POA: Insufficient documentation

## 2014-05-31 DIAGNOSIS — I10 Essential (primary) hypertension: Secondary | ICD-10-CM

## 2014-05-31 DIAGNOSIS — I5032 Chronic diastolic (congestive) heart failure: Secondary | ICD-10-CM

## 2014-05-31 DIAGNOSIS — E1165 Type 2 diabetes mellitus with hyperglycemia: Secondary | ICD-10-CM | POA: Insufficient documentation

## 2014-07-10 ENCOUNTER — Encounter
Admission: RE | Admit: 2014-07-10 | Discharge: 2014-07-10 | Disposition: A | Discharge status: Home / Self Care | Payer: Medicare Other | Source: Ambulatory Visit | Attending: Ophthalmology | Admitting: Ophthalmology

## 2014-07-10 DIAGNOSIS — Z01810 Encounter for preprocedural cardiovascular examination: Secondary | ICD-10-CM | POA: Diagnosis present

## 2014-07-10 DIAGNOSIS — Z01812 Encounter for preprocedural laboratory examination: Secondary | ICD-10-CM | POA: Insufficient documentation

## 2014-07-10 LAB — DIFFERENTIAL
BASOS ABS: 0.1 10*3/uL (ref 0–0.1)
BASOS PCT: 1 %
EOS ABS: 0.4 10*3/uL (ref 0–0.7)
Eosinophils Relative: 5 %
Lymphocytes Relative: 23 %
Lymphs Abs: 1.7 10*3/uL (ref 1.0–3.6)
MONO ABS: 0.5 10*3/uL (ref 0.2–0.9)
Monocytes Relative: 7 %
Neutro Abs: 4.7 10*3/uL (ref 1.4–6.5)
Neutrophils Relative %: 64 %

## 2014-07-10 LAB — CBC
HCT: 37.5 % (ref 35.0–47.0)
HEMOGLOBIN: 12.2 g/dL (ref 12.0–16.0)
MCH: 28.3 pg (ref 26.0–34.0)
MCHC: 32.6 g/dL (ref 32.0–36.0)
MCV: 86.8 fL (ref 80.0–100.0)
Platelets: 212 10*3/uL (ref 150–440)
RBC: 4.33 MIL/uL (ref 3.80–5.20)
RDW: 19.2 % — ABNORMAL HIGH (ref 11.5–14.5)
WBC: 7.4 10*3/uL (ref 3.6–11.0)

## 2014-07-11 ENCOUNTER — Encounter: Payer: Self-pay | Admitting: *Deleted

## 2014-07-11 DIAGNOSIS — E079 Disorder of thyroid, unspecified: Secondary | ICD-10-CM | POA: Diagnosis not present

## 2014-07-11 DIAGNOSIS — Z9049 Acquired absence of other specified parts of digestive tract: Secondary | ICD-10-CM | POA: Diagnosis not present

## 2014-07-11 DIAGNOSIS — M7989 Other specified soft tissue disorders: Secondary | ICD-10-CM | POA: Diagnosis not present

## 2014-07-11 DIAGNOSIS — Z8673 Personal history of transient ischemic attack (TIA), and cerebral infarction without residual deficits: Secondary | ICD-10-CM | POA: Diagnosis not present

## 2014-07-11 DIAGNOSIS — E119 Type 2 diabetes mellitus without complications: Secondary | ICD-10-CM | POA: Diagnosis not present

## 2014-07-11 DIAGNOSIS — G629 Polyneuropathy, unspecified: Secondary | ICD-10-CM | POA: Diagnosis not present

## 2014-07-11 DIAGNOSIS — E780 Pure hypercholesterolemia: Secondary | ICD-10-CM | POA: Diagnosis not present

## 2014-07-11 DIAGNOSIS — H2512 Age-related nuclear cataract, left eye: Secondary | ICD-10-CM | POA: Diagnosis present

## 2014-07-11 DIAGNOSIS — D649 Anemia, unspecified: Secondary | ICD-10-CM | POA: Diagnosis not present

## 2014-07-11 DIAGNOSIS — Z87891 Personal history of nicotine dependence: Secondary | ICD-10-CM | POA: Diagnosis not present

## 2014-07-11 DIAGNOSIS — Z885 Allergy status to narcotic agent status: Secondary | ICD-10-CM | POA: Diagnosis not present

## 2014-07-20 ENCOUNTER — Encounter
Admission: RE | Disposition: A | Discharge status: Home / Self Care | Payer: Self-pay | Source: Ambulatory Visit | Attending: Ophthalmology

## 2014-07-20 ENCOUNTER — Ambulatory Visit
Admission: RE | Admit: 2014-07-20 | Discharge: 2014-07-20 | Disposition: A | Discharge status: Home / Self Care | Payer: Medicare Other | Source: Ambulatory Visit | Attending: Ophthalmology | Admitting: Ophthalmology

## 2014-07-20 ENCOUNTER — Encounter: Payer: Self-pay | Admitting: *Deleted

## 2014-07-20 ENCOUNTER — Ambulatory Visit: Payer: Medicare Other | Admitting: Anesthesiology

## 2014-07-20 DIAGNOSIS — M7989 Other specified soft tissue disorders: Secondary | ICD-10-CM | POA: Insufficient documentation

## 2014-07-20 DIAGNOSIS — H2512 Age-related nuclear cataract, left eye: Secondary | ICD-10-CM | POA: Diagnosis not present

## 2014-07-20 DIAGNOSIS — G629 Polyneuropathy, unspecified: Secondary | ICD-10-CM | POA: Insufficient documentation

## 2014-07-20 DIAGNOSIS — D649 Anemia, unspecified: Secondary | ICD-10-CM | POA: Insufficient documentation

## 2014-07-20 DIAGNOSIS — E780 Pure hypercholesterolemia: Secondary | ICD-10-CM | POA: Insufficient documentation

## 2014-07-20 DIAGNOSIS — Z87891 Personal history of nicotine dependence: Secondary | ICD-10-CM | POA: Insufficient documentation

## 2014-07-20 DIAGNOSIS — Z885 Allergy status to narcotic agent status: Secondary | ICD-10-CM | POA: Insufficient documentation

## 2014-07-20 DIAGNOSIS — Z9049 Acquired absence of other specified parts of digestive tract: Secondary | ICD-10-CM | POA: Insufficient documentation

## 2014-07-20 DIAGNOSIS — E079 Disorder of thyroid, unspecified: Secondary | ICD-10-CM | POA: Insufficient documentation

## 2014-07-20 DIAGNOSIS — Z8673 Personal history of transient ischemic attack (TIA), and cerebral infarction without residual deficits: Secondary | ICD-10-CM | POA: Insufficient documentation

## 2014-07-20 DIAGNOSIS — E119 Type 2 diabetes mellitus without complications: Secondary | ICD-10-CM | POA: Insufficient documentation

## 2014-07-20 HISTORY — DX: Cerebral infarction, unspecified: I63.9

## 2014-07-20 HISTORY — DX: Polyneuropathy, unspecified: G62.9

## 2014-07-20 HISTORY — DX: Essential (primary) hypertension: I10

## 2014-07-20 HISTORY — DX: Anemia, unspecified: D64.9

## 2014-07-20 HISTORY — DX: Edema, unspecified: R60.9

## 2014-07-20 HISTORY — PX: CATARACT EXTRACTION W/PHACO: SHX586

## 2014-07-20 LAB — GLUCOSE, CAPILLARY: GLUCOSE-CAPILLARY: 108 mg/dL — AB (ref 65–99)

## 2014-07-20 SURGERY — PHACOEMULSIFICATION, CATARACT, WITH IOL INSERTION
Anesthesia: Monitor Anesthesia Care | Laterality: Left | Wound class: Clean

## 2014-07-20 MED ORDER — MOXIFLOXACIN HCL 0.5 % OP SOLN
OPHTHALMIC | Status: DC | PRN
  Administered 2014-07-20: 2 [drp]

## 2014-07-20 MED ORDER — POVIDONE-IODINE 5 % OP SOLN
OPHTHALMIC | Status: AC
  Administered 2014-07-20: 1 via OPHTHALMIC
  Filled 2014-07-20: qty 30

## 2014-07-20 MED ORDER — CEFUROXIME OPHTHALMIC INJECTION 1 MG/0.1 ML
INJECTION | OPHTHALMIC | Status: DC | PRN
  Administered 2014-07-20: .1 mL via INTRACAMERAL

## 2014-07-20 MED ORDER — MOXIFLOXACIN HCL 0.5 % OP SOLN: 1.0000 [drp] | OPHTHALMIC | Status: DC | PRN

## 2014-07-20 MED ORDER — MOXIFLOXACIN HCL 0.5 % OP SOLN
OPHTHALMIC | Status: AC
  Filled 2014-07-20: qty 3

## 2014-07-20 MED ORDER — TETRACAINE HCL 0.5 % OP SOLN
1.0000 [drp] | OPHTHALMIC | Status: AC | PRN
  Administered 2014-07-20: 1 [drp] via OPHTHALMIC

## 2014-07-20 MED ORDER — EPINEPHRINE HCL 1 MG/ML IJ SOLN
INTRAMUSCULAR | Status: AC
  Filled 2014-07-20: qty 1

## 2014-07-20 MED ORDER — NEOMYCIN-POLYMYXIN-DEXAMETH 3.5-10000-0.1 OP OINT
TOPICAL_OINTMENT | OPHTHALMIC | Status: AC
  Filled 2014-07-20: qty 3.5

## 2014-07-20 MED ORDER — NA HYALUR & NA CHOND-NA HYALUR 0.55-0.5 ML IO KIT
PACK | INTRAOCULAR | Status: AC
  Filled 2014-07-20: qty 1.05

## 2014-07-20 MED ORDER — SODIUM CHLORIDE 0.9 % IV SOLN
INTRAVENOUS | Status: DC
  Administered 2014-07-20: 07:00:00 via INTRAVENOUS

## 2014-07-20 MED ORDER — ARMC OPHTHALMIC DILATING GEL
OPHTHALMIC | Status: AC
  Administered 2014-07-20: 1 via OPHTHALMIC
  Filled 2014-07-20: qty 0.25

## 2014-07-20 MED ORDER — POVIDONE-IODINE 5 % OP SOLN
1.0000 "application " | OPHTHALMIC | Status: AC | PRN
  Administered 2014-07-20: 1 via OPHTHALMIC

## 2014-07-20 MED ORDER — TETRACAINE HCL 0.5 % OP SOLN
OPHTHALMIC | Status: AC
Start: 2014-07-20 — End: 2014-07-20
  Administered 2014-07-20: 1 [drp] via OPHTHALMIC
  Filled 2014-07-20: qty 2

## 2014-07-20 MED ORDER — MIDAZOLAM HCL 2 MG/2ML IJ SOLN
INTRAMUSCULAR | Status: DC | PRN
  Administered 2014-07-20: 1 mg via INTRAVENOUS

## 2014-07-20 MED ORDER — ARMC OPHTHALMIC DILATING GEL
1.0000 "application " | OPHTHALMIC | Status: AC | PRN
  Administered 2014-07-20 (×2): 1 via OPHTHALMIC

## 2014-07-20 MED ORDER — CEFUROXIME OPHTHALMIC INJECTION 1 MG/0.1 ML
INJECTION | OPHTHALMIC | Status: AC
  Filled 2014-07-20: qty 0.1

## 2014-07-20 MED ORDER — NEOMYCIN-POLYMYXIN-DEXAMETH 3.5-10000-0.1 OP OINT
TOPICAL_OINTMENT | OPHTHALMIC | Status: DC | PRN
  Administered 2014-07-20: 1 via OPHTHALMIC

## 2014-07-20 MED ORDER — BSS IO SOLN
INTRAOCULAR | Status: DC | PRN
  Administered 2014-07-20: 150 mL via INTRAOCULAR

## 2014-07-20 SURGICAL SUPPLY — 19 items
BSS 15ML ×3 IMPLANT
CANNULA ANT/CHMB 27GA (MISCELLANEOUS) ×3 IMPLANT
GLOVE BIO SURGEON STRL SZ8 (GLOVE) ×3 IMPLANT
GLOVE BIOGEL M 6.5 STRL (GLOVE) ×3 IMPLANT
GLOVE SURG LX 7.5 STRW (GLOVE) ×2
GLOVE SURG LX STRL 7.5 STRW (GLOVE) ×1 IMPLANT
GOWN STRL REUS W/ TWL LRG LVL3 (GOWN DISPOSABLE) ×2 IMPLANT
GOWN STRL REUS W/TWL LRG LVL3 (GOWN DISPOSABLE) ×4
LENS IOL ACRSF IQ PC 24.0 (Intraocular Lens) ×1 IMPLANT
LENS IOL ACRYSOF IQ POST 24.0 (Intraocular Lens) ×3 IMPLANT
PACK CATARACT (MISCELLANEOUS) ×3 IMPLANT
PACK CATARACT BRASINGTON LX (MISCELLANEOUS) ×3 IMPLANT
PACK EYE AFTER SURG (MISCELLANEOUS) ×3 IMPLANT
SOL PREP PVP 2OZ (MISCELLANEOUS)
SOLUTION PREP PVP 2OZ (MISCELLANEOUS) IMPLANT
SYR 5ML LL (SYRINGE) ×3 IMPLANT
SYR TB 1ML 27GX1/2 LL (SYRINGE) ×3 IMPLANT
WATER STERILE IRR 1000ML POUR (IV SOLUTION) ×3 IMPLANT
WIPE NON LINTING 3.25X3.25 (MISCELLANEOUS) ×3 IMPLANT

## 2014-07-20 NOTE — Anesthesia Postprocedure Evaluation (Signed)
  Anesthesia Post-op Note   Patient: Sharon [name]  Procedure(s) Performed: Procedure(s) with comments: CATARACT EXTRACTION PHACO AND INTRAOCULAR LENS PLACEMENT (IOC) (Left) - [place]  1:18                 AP     23.6             CDE   9.69      lot #[id number]  Anesthesia type:General, MAC  Patient location: short stay  Post pain: Pain level controlled  Post assessment: Post-op Vital signs reviewed, Patient's Cardiovascular Status Stable, Respiratory Function Stable, Patent Airway and No signs of Nausea or vomiting  Post vital signs: Reviewed and stable  Last Vitals:  Filed Vitals:   07/20/14 0755  BP: 177/50  Pulse: 40  Temp: 35.7 C  Resp: 16    Level of consciousness: awake, alert  and patient cooperative  Complications: No apparent anesthesia complications

## 2014-07-20 NOTE — Discharge Instructions (Signed)
Follow Dr. Larrie Kass eye drop instruction sheet.

## 2014-07-20 NOTE — Transfer of Care (Signed)
Immediate Anesthesia Transfer of Care Note  Patient: Sharon [name]  Procedure(s) Performed: Procedure(s) with comments: CATARACT EXTRACTION PHACO AND INTRAOCULAR LENS PLACEMENT (IOC) (Left) - [place]  1:18                 AP     23.6             CDE   9.69      lot #[id number]  Patient Location: Short Stay  Anesthesia Type:MAC  Level of Consciousness: awake, alert  and oriented  Airway & Oxygen Therapy: Patient Spontanous Breathing and Patient connected to nasal cannula oxygen  Post-op Assessment: Report given to RN and Post -op Vital signs reviewed and stable  Post vital signs: Reviewed and stable    Last Vitals:  Filed Vitals:   07/20/14 0755  BP: 177/50  Pulse: 40  Temp: 35.7 C  Resp: 16    Complications: No apparent anesthesia complications

## 2014-07-20 NOTE — H&P (Signed)
  The History and Physical notes were scanned in.  The patient remains stable and unchanged from the H&P.   Previous H&P reviewed, patient examined, and there are no changes.  Sharon [name] 07/20/2014 7:26 AM

## 2014-07-20 NOTE — Op Note (Signed)
OPERATIVE NOTE  Haizlee Henton Ketchum [id number] 07/20/2014   PREOPERATIVE DIAGNOSIS:      Nuclear sclerotic cataract left eye. H25.12   POSTOPERATIVE DIAGNOSIS: nuclear sclerotic cataract left eye          Nuclear sclerotic cataract left eye.     PROCEDURE:  Phacoemusification with posterior chamber intraocular lens placement of the left eye   LENS:   Implant Name Type Inv. Item Serial No. Manufacturer Lot No. LRB No. Used  IOL lens     [id number]     Left 1     [id number] 24.0 D   ULTRASOUND TIME: 24 % of 1 minutes, 18 seconds.  CDE 9.7   SURGEON:  Wyonia Hough, MD   ANESTHESIA:  Topical with tetracaine drops and 2% Xylocaine jelly.   COMPLICATIONS:  None.   DESCRIPTION OF PROCEDURE:  The patient was identified in the holding room and transported to the operating room and placed in the supine position under the operating microscope.  The left eye was identified as the operative eye and it was prepped and draped in the usual sterile ophthalmic fashion.   A 1 millimeter clear-corneal paracentesis was made at the 1:30 position.  The anterior chamber was filled with Viscoat viscoelastic.  A 2.4 millimeter keratome was used to make a near-clear corneal incision at the 10:30 position.  .  A curvilinear capsulorrhexis was made with a cystotome and capsulorrhexis forceps.  Balanced salt solution was used to hydrodissect and hydrodelineate the nucleus.   Phacoemulsification was then used in stop and chop fashion to remove the lens nucleus and epinucleus.  The remaining cortex was then removed using the irrigation and aspiration handpiece. Provisc was then placed into the capsular bag to distend it for lens placement.  A 24.0 -diopter lens was then injected into the capsular bag.  The remaining viscoelastic was aspirated.   Wounds were hydrated with balanced salt solution.  The anterior chamber was inflated to a physiologic pressure with balanced salt solution. Cefuroxime 0.1 ml of a  '10mg'$ /ml solution was injected into the anterior chamber for a dose of 1 mg of intracameral antibiotic at the completion of the case.  Miostat was placed into the anterior chamber to constrict the pupil.  No wound leaks were noted.  Topical Vigamox drops and Maxitrol ointment were applied to the eye.  The patient was taken to the recovery room in stable condition without complications of anesthesia or surgery  Tiwan Schnitker 07/20/2014, 7:56 AM

## 2014-07-20 NOTE — Anesthesia Preprocedure Evaluation (Signed)
Anesthesia Evaluation  Patient identified by MRN, date of birth, ID band Patient awake    Reviewed: Allergy & Precautions, NPO status , Patient's Chart, lab work & pertinent test results, reviewed documented beta blocker date and time   Airway Mallampati: II  TM Distance: >3 FB     Dental  (+) Chipped   Pulmonary former smoker,          Cardiovascular hypertension, +CHF     Neuro/Psych CVA    GI/Hepatic   Endo/Other  diabetesHypothyroidism   Renal/GU      Musculoskeletal   Abdominal   Peds  Hematology  (+) anemia ,   Anesthesia Other Findings   Reproductive/Obstetrics                             Anesthesia Physical Anesthesia Plan  ASA: III  Anesthesia Plan: General and MAC   Post-op Pain Management:    Induction: Intravenous  Airway Management Planned: Nasal Cannula  Additional Equipment:   Intra-op Plan:   Post-operative Plan:   Informed Consent: I have reviewed the patients History and Physical, chart, labs and discussed the procedure including the risks, benefits and alternatives for the proposed anesthesia with the patient or authorized representative who has indicated his/her understanding and acceptance.     Plan Discussed with: CRNA  Anesthesia Plan Comments:         Anesthesia Quick Evaluation

## 2014-08-22 ENCOUNTER — Emergency Department
Admission: EM | Admit: 2014-08-22 | Discharge: 2014-08-22 | Disposition: A | Discharge status: Home / Self Care | Payer: Medicare Other | Attending: Emergency Medicine | Admitting: Emergency Medicine

## 2014-08-22 ENCOUNTER — Encounter: Payer: Self-pay | Admitting: Emergency Medicine

## 2014-08-22 ENCOUNTER — Emergency Department: Payer: Medicare Other

## 2014-08-22 DIAGNOSIS — I10 Essential (primary) hypertension: Secondary | ICD-10-CM | POA: Insufficient documentation

## 2014-08-22 DIAGNOSIS — Z79899 Other long term (current) drug therapy: Secondary | ICD-10-CM | POA: Insufficient documentation

## 2014-08-22 DIAGNOSIS — E119 Type 2 diabetes mellitus without complications: Secondary | ICD-10-CM | POA: Insufficient documentation

## 2014-08-22 DIAGNOSIS — Z87891 Personal history of nicotine dependence: Secondary | ICD-10-CM | POA: Insufficient documentation

## 2014-08-22 DIAGNOSIS — M25579 Pain in unspecified ankle and joints of unspecified foot: Secondary | ICD-10-CM | POA: Diagnosis present

## 2014-08-22 DIAGNOSIS — Z7982 Long term (current) use of aspirin: Secondary | ICD-10-CM | POA: Insufficient documentation

## 2014-08-22 DIAGNOSIS — G8929 Other chronic pain: Secondary | ICD-10-CM | POA: Diagnosis not present

## 2014-08-22 DIAGNOSIS — G629 Polyneuropathy, unspecified: Secondary | ICD-10-CM | POA: Insufficient documentation

## 2014-08-22 DIAGNOSIS — J8410 Pulmonary fibrosis, unspecified: Secondary | ICD-10-CM | POA: Diagnosis not present

## 2014-08-22 DIAGNOSIS — Z792 Long term (current) use of antibiotics: Secondary | ICD-10-CM | POA: Diagnosis not present

## 2014-08-22 NOTE — ED Notes (Signed)
Nail polish removed from pts fingers, o2 sat 93% on RA

## 2014-08-22 NOTE — ED Notes (Signed)
Pt arrives from [facility], pt was being seen for foot paon and they stated her o2 sat was 92% on RA and was sent to ER, upon arrival pt in no distress, speaking in full sentances in no distress, lungs clear, room air sat 88-95%

## 2014-08-22 NOTE — ED Notes (Signed)
Pt to ed with c/o right foot pain,  Pt states she has hx of neuropathy and felt like she needed some pain meds for foot.  Went to [facility] and was sent here due to low oxygen levels.  Pt sats at triage are 92% on room air.  Pt denies hx of O2 use at home. Appears in no resp distress at this time and denies chest pain or sob.

## 2014-08-22 NOTE — ED Provider Notes (Signed)
[facility] Emergency Department Provider Note  ____________________________________________  Time seen: Approximately 4:30 PM  I have reviewed the triage vital signs and the nursing notes.   HISTORY  Chief Complaint Foot Pain    HPI Sharon [name] is a 81 y.o. female with a history of CHF and oxygen saturation the low 90s from the [facility]. She said that she was going over to the [facility] today because she has been having on and off foot pain which she believes to be neuropathy. Currently, she is not having any foot pain. Denies any shortness of breath cough fever or chest pain. Says that she was a former smoker but does not smoke anymore. 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  denies any injury to the feet.    Past Medical History  Diagnosis Date  . CHF (congestive heart failure)   . Diabetes mellitus without complication   . Hyperlipidemia   . Atrial fibrillation   . Hypothyroid   . Hypertension   . Stroke   . Neuropathy   . Anemia   . Edema     feet/ankles occas    Patient Active Problem List   Diagnosis Date Noted  . Chronic diastolic heart failure 40/81/4481  . HTN (hypertension) 05/31/2014  . Diabetes 05/31/2014    Past Surgical History  Procedure Laterality Date  . Hemiarthroplasty hip Right   . Hemiarthroplasty hip Left   . Cholecystectomy    . Appendectomy    . Cardiac catheterization  08/25/13  . Coronary angioplasty    . Cataract extraction w/phaco Left 07/20/2014    Procedure: CATARACT EXTRACTION PHACO AND INTRAOCULAR LENS PLACEMENT (IOC);  Surgeon: Leandrew Koyanagi, MD;  Location: [facility] ORS;  Service: Ophthalmology;  Laterality: Left;  [place]  1:18                 AP     23.6             CDE   9.69      lot #[id number]    Current Outpatient Rx  Name  Route  Sig  Dispense  Refill  . amiodarone (PACERONE) 200 MG tablet    Oral   Take 200 mg by mouth daily.         Marland Kitchen aspirin 81 MG tablet   Oral   Take 81 mg by mouth daily.         . Cholecalciferol 1000 UNITS capsule   Oral   Take 1,000 Units by mouth daily.         Marland Kitchen docusate (COLACE) 50 MG/5ML liquid   Oral   Take 100 mg by mouth 2 (two) times daily.         Marland Kitchen doxycycline (ORACEA) 40 MG capsule   Oral   Take 40 mg by mouth 2 (two) times daily.         . ferrous sulfate 325 (65 FE) MG tablet   Oral   Take 325 mg by mouth daily with breakfast.         . gabapentin (NEURONTIN) 300 MG capsule   Oral   Take 300 mg by mouth 2 (two) times daily.         Marland Kitchen glipiZIDE (GLUCOTROL) 10 MG tablet   Oral   Take by mouth daily before breakfast.         . glyBURIDE-metformin (GLUCOVANCE) 5-500 MG per tablet  Oral   Take 1 tablet by mouth daily with breakfast.         . levothyroxine (SYNTHROID, LEVOTHROID) 112 MCG tablet   Oral   Take 112 mcg by mouth daily before breakfast.         . lisinopril (PRINIVIL,ZESTRIL) 5 MG tablet   Oral   Take 5 mg by mouth daily.         . metoprolol tartrate (LOPRESSOR) 25 MG tablet   Oral   Take 25 mg by mouth at bedtime.          . Multiple Vitamins-Minerals (PRESERVISION AREDS) TABS   Oral   Take 1 tablet by mouth 2 times daily at 12 noon and 4 pm.         . pantoprazole (PROTONIX) 20 MG tablet   Oral   Take 20 mg by mouth daily.         . simvastatin (ZOCOR) 40 MG tablet   Oral   Take 40 mg by mouth daily.         . temazepam (RESTORIL) 15 MG capsule   Oral   Take 15 mg by mouth at bedtime as needed for sleep.           Allergies Codeine and Cymbalta  Family History  Problem Relation Age of Onset  . Heart attack Mother     Social History History  Substance Use Topics  . Smoking status: Former Smoker -- 0.50 packs/day for 7 years    Types: Cigarettes    Quit date: 05/31/1998  . Smokeless tobacco: Never Used  . Alcohol Use: No    Review of  Systems Constitutional: No fever/chills Eyes: No visual changes. ENT: No sore throat. Cardiovascular: Denies chest pain. Respiratory: Denies shortness of breath. Gastrointestinal: No abdominal pain.  No nausea, no vomiting.  No diarrhea.  No constipation. Genitourinary: Negative for dysuria. Musculoskeletal: Negative for back pain. Skin: Negative for rash. Neurological: Negative for headaches, focal weakness or numbness.  10-point ROS otherwise negative.  ____________________________________________   PHYSICAL EXAM:  VITAL SIGNS: ED Triage Vitals  Enc Vitals Group     BP 08/22/14 1553 154/57 mmHg     Pulse Rate 08/22/14 1553 77     Resp 08/22/14 1553 20     Temp 08/22/14 1553 98.4 F (36.9 C)     Temp src --      SpO2 08/22/14 1553 92 %     Weight 08/22/14 1553 135 lb (61.236 kg)     Height 08/22/14 1553 '5\' 5"'$  (1.651 m)     Head Cir --      Peak Flow --      Pain Score 08/22/14 1554 3     Pain Loc --      Pain Edu? --      Excl. in [place]? --     Constitutional: Alert and oriented. Well appearing and in no acute distress. Eyes: Conjunctivae are normal. PERRL. EOMI. Head: Atraumatic. Nose: No congestion/rhinnorhea. Mouth/Throat: Mucous membranes are moist.  Oropharynx non-erythematous. Neck: No stridor.   Cardiovascular: Normal rate, regular rhythm. Grossly normal heart sounds.  Good peripheral circulation. Respiratory: Normal respiratory effort.  No retractions. Lungs CTAB. Gastrointestinal: Soft and nontender. No distention. No abdominal bruits. No CVA tenderness. Musculoskeletal: No lower extremity tenderness nor edema.  No joint effusions. dorsalis pedis pulses present and equal bilaterally. Sensation intact to light touch to the feet bilaterally.  Neurologic:  Normal speech and language. No gross focal neurologic deficits are  appreciated. No gait instability. Skin:  Skin is warm, dry and intact. No rash noted. Psychiatric: Mood and affect are normal. Speech and  behavior are normal.  ____________________________________________   LABS (all labs ordered are listed, but only abnormal results are displayed)  Labs Reviewed - No data to display ____________________________________________  EKG   ________________________________________Acute pulmonary fibrosis. I personally reviewed these images._____________________   PROCEDURES    ____________________________________________   INITIAL IMPRESSION / ASSESSMENT AND PLAN / ED COURSE  Pertinent labs & imaging results that were available during my care of the patient were reviewed by me and considered in my medical decision making (see chart for details).already on Neurontin. Will follow up with primary care doctor for both adjustment of her Neurontin dosing as well as new diagnosis of pulmonary scarring/fibrosis. Discussed the x-ray with the patient. While in the room she was between 90-96% on room air. Do not feel the need to establish home O2 at this point. Will follow-up in office as an outpatient. Patient asymptomatic throughout visit and to go home. ___________________________________________   FINAL CLINICAL IMPRESSION(S) / ED DIAGNOSES  Pulmonary fibrosis. Chronic foot pain. Initial visit.     Orbie Pyo, MD 08/22/14 [phone]

## 2014-09-29 ENCOUNTER — Ambulatory Visit
Admission: RE | Admit: 2014-09-29 | Discharge: 2014-09-29 | Disposition: A | Discharge status: Home / Self Care | Payer: Medicare Other | Source: Ambulatory Visit | Attending: Physician Assistant | Admitting: Physician Assistant

## 2014-09-29 ENCOUNTER — Other Ambulatory Visit: Payer: Self-pay | Admitting: Physician Assistant

## 2014-09-29 DIAGNOSIS — M7989 Other specified soft tissue disorders: Secondary | ICD-10-CM

## 2014-10-09 ENCOUNTER — Other Ambulatory Visit
Admission: RE | Admit: 2014-10-09 | Discharge: 2014-10-09 | Disposition: A | Discharge status: Home / Self Care | Payer: Medicare Other | Source: Ambulatory Visit | Attending: Podiatry | Admitting: Podiatry

## 2014-10-09 DIAGNOSIS — L0390 Cellulitis, unspecified: Secondary | ICD-10-CM | POA: Insufficient documentation

## 2014-10-10 ENCOUNTER — Inpatient Hospital Stay: Payer: Medicare Other

## 2014-10-10 ENCOUNTER — Inpatient Hospital Stay
Admission: AD | Admit: 2014-10-10 | Discharge: 2014-10-17 | DRG: 629 | Disposition: A | Discharge status: Skilled Nursing Facility | Payer: Medicare Other | Source: Ambulatory Visit | Attending: Internal Medicine | Admitting: Internal Medicine

## 2014-10-10 DIAGNOSIS — R52 Pain, unspecified: Secondary | ICD-10-CM

## 2014-10-10 DIAGNOSIS — D649 Anemia, unspecified: Secondary | ICD-10-CM | POA: Diagnosis present

## 2014-10-10 DIAGNOSIS — Z7984 Long term (current) use of oral hypoglycemic drugs: Secondary | ICD-10-CM

## 2014-10-10 DIAGNOSIS — Z8249 Family history of ischemic heart disease and other diseases of the circulatory system: Secondary | ICD-10-CM

## 2014-10-10 DIAGNOSIS — Z87891 Personal history of nicotine dependence: Secondary | ICD-10-CM

## 2014-10-10 DIAGNOSIS — Z8673 Personal history of transient ischemic attack (TIA), and cerebral infarction without residual deficits: Secondary | ICD-10-CM

## 2014-10-10 DIAGNOSIS — I110 Hypertensive heart disease with heart failure: Secondary | ICD-10-CM | POA: Diagnosis present

## 2014-10-10 DIAGNOSIS — Z886 Allergy status to analgesic agent status: Secondary | ICD-10-CM

## 2014-10-10 DIAGNOSIS — F0390 Unspecified dementia without behavioral disturbance: Secondary | ICD-10-CM | POA: Diagnosis present

## 2014-10-10 DIAGNOSIS — I480 Paroxysmal atrial fibrillation: Secondary | ICD-10-CM | POA: Diagnosis present

## 2014-10-10 DIAGNOSIS — B9562 Methicillin resistant Staphylococcus aureus infection as the cause of diseases classified elsewhere: Secondary | ICD-10-CM | POA: Diagnosis present

## 2014-10-10 DIAGNOSIS — L03116 Cellulitis of left lower limb: Secondary | ICD-10-CM | POA: Diagnosis present

## 2014-10-10 DIAGNOSIS — I509 Heart failure, unspecified: Secondary | ICD-10-CM | POA: Diagnosis present

## 2014-10-10 DIAGNOSIS — E039 Hypothyroidism, unspecified: Secondary | ICD-10-CM | POA: Diagnosis present

## 2014-10-10 DIAGNOSIS — Z9049 Acquired absence of other specified parts of digestive tract: Secondary | ICD-10-CM

## 2014-10-10 DIAGNOSIS — I482 Chronic atrial fibrillation: Secondary | ICD-10-CM | POA: Diagnosis present

## 2014-10-10 DIAGNOSIS — Z7982 Long term (current) use of aspirin: Secondary | ICD-10-CM

## 2014-10-10 DIAGNOSIS — G629 Polyneuropathy, unspecified: Secondary | ICD-10-CM | POA: Diagnosis present

## 2014-10-10 DIAGNOSIS — E1169 Type 2 diabetes mellitus with other specified complication: Principal | ICD-10-CM | POA: Diagnosis present

## 2014-10-10 DIAGNOSIS — M869 Osteomyelitis, unspecified: Secondary | ICD-10-CM | POA: Diagnosis present

## 2014-10-10 DIAGNOSIS — Z95828 Presence of other vascular implants and grafts: Secondary | ICD-10-CM

## 2014-10-10 DIAGNOSIS — L089 Local infection of the skin and subcutaneous tissue, unspecified: Secondary | ICD-10-CM

## 2014-10-10 DIAGNOSIS — L97529 Non-pressure chronic ulcer of other part of left foot with unspecified severity: Secondary | ICD-10-CM | POA: Diagnosis present

## 2014-10-10 DIAGNOSIS — E785 Hyperlipidemia, unspecified: Secondary | ICD-10-CM | POA: Diagnosis present

## 2014-10-10 DIAGNOSIS — E11621 Type 2 diabetes mellitus with foot ulcer: Secondary | ICD-10-CM | POA: Diagnosis present

## 2014-10-10 DIAGNOSIS — E11628 Type 2 diabetes mellitus with other skin complications: Secondary | ICD-10-CM | POA: Diagnosis present

## 2014-10-10 LAB — GLUCOSE, CAPILLARY
GLUCOSE-CAPILLARY: 145 mg/dL — AB (ref 65–99)
Glucose-Capillary: 110 mg/dL — ABNORMAL HIGH (ref 65–99)
Glucose-Capillary: 166 mg/dL — ABNORMAL HIGH (ref 65–99)

## 2014-10-10 LAB — BASIC METABOLIC PANEL
ANION GAP: 7 (ref 5–15)
BUN: 21 mg/dL — AB (ref 6–20)
CALCIUM: 8.8 mg/dL — AB (ref 8.9–10.3)
CO2: 26 mmol/L (ref 22–32)
CREATININE: 1.35 mg/dL — AB (ref 0.44–1.00)
Chloride: 105 mmol/L (ref 101–111)
GFR calc Af Amer: 42 mL/min — ABNORMAL LOW (ref >60)
GFR calc non Af Amer: 36 mL/min — ABNORMAL LOW (ref >60)
GLUCOSE: 123 mg/dL — AB (ref 65–99)
Potassium: 3.9 mmol/L (ref 3.5–5.1)
Sodium: 138 mmol/L (ref 135–145)

## 2014-10-10 LAB — CBC
HEMATOCRIT: 32.3 % — AB (ref 35.0–47.0)
Hemoglobin: 10.2 g/dL — ABNORMAL LOW (ref 12.0–16.0)
MCH: 26.2 pg (ref 26.0–34.0)
MCHC: 31.7 g/dL — AB (ref 32.0–36.0)
MCV: 82.9 fL (ref 80.0–100.0)
PLATELETS: 343 10*3/uL (ref 150–440)
RBC: 3.9 MIL/uL (ref 3.80–5.20)
RDW: 16.7 % — AB (ref 11.5–14.5)
WBC: 9.5 10*3/uL (ref 3.6–11.0)

## 2014-10-10 MED ORDER — ACETAMINOPHEN 325 MG PO TABS
650.0000 mg | ORAL_TABLET | Freq: Four times a day (QID) | ORAL | Status: DC | PRN
  Administered 2014-10-13: 650 mg via ORAL
  Filled 2014-10-10: qty 2

## 2014-10-10 MED ORDER — VITAMIN D 1000 UNITS PO TABS
1000.0000 [IU] | ORAL_TABLET | Freq: Every day | ORAL | Status: DC
  Administered 2014-10-10 – 2014-10-17 (×8): 1000 [IU] via ORAL
  Filled 2014-10-10 (×8): qty 1

## 2014-10-10 MED ORDER — METOPROLOL TARTRATE 25 MG PO TABS
25.0000 mg | ORAL_TABLET | Freq: Every day | ORAL | Status: DC
  Administered 2014-10-10 – 2014-10-16 (×7): 25 mg via ORAL
  Filled 2014-10-10 (×7): qty 1

## 2014-10-10 MED ORDER — LEVOTHYROXINE SODIUM 112 MCG PO TABS
112.0000 ug | ORAL_TABLET | Freq: Every day | ORAL | Status: DC
  Administered 2014-10-11 – 2014-10-17 (×7): 112 ug via ORAL
  Filled 2014-10-10 (×7): qty 1

## 2014-10-10 MED ORDER — INSULIN ASPART 100 UNIT/ML ~~LOC~~ SOLN
3.0000 [IU] | Freq: Three times a day (TID) | SUBCUTANEOUS | Status: DC
  Administered 2014-10-10 – 2014-10-17 (×14): 3 [IU] via SUBCUTANEOUS
  Filled 2014-10-10 (×5): qty 3
  Filled 2014-10-10: qty 1
  Filled 2014-10-10 (×9): qty 3

## 2014-10-10 MED ORDER — LISINOPRIL 5 MG PO TABS
5.0000 mg | ORAL_TABLET | Freq: Every day | ORAL | Status: DC
  Administered 2014-10-11 – 2014-10-17 (×6): 5 mg via ORAL
  Filled 2014-10-10 (×7): qty 1

## 2014-10-10 MED ORDER — FERROUS SULFATE 325 (65 FE) MG PO TABS
325.0000 mg | ORAL_TABLET | Freq: Every day | ORAL | Status: DC
  Administered 2014-10-11 – 2014-10-17 (×7): 325 mg via ORAL
  Filled 2014-10-10 (×7): qty 1

## 2014-10-10 MED ORDER — TEMAZEPAM 15 MG PO CAPS
15.0000 mg | ORAL_CAPSULE | Freq: Every evening | ORAL | Status: DC | PRN
  Administered 2014-10-10 – 2014-10-17 (×5): 15 mg via ORAL
  Filled 2014-10-10 (×5): qty 1

## 2014-10-10 MED ORDER — OCUVITE-LUTEIN PO CAPS
1.0000 | ORAL_CAPSULE | Freq: Every day | ORAL | Status: DC
  Administered 2014-10-11 – 2014-10-17 (×7): 1 via ORAL
  Filled 2014-10-10 (×7): qty 1

## 2014-10-10 MED ORDER — ALUM & MAG HYDROXIDE-SIMETH 200-200-20 MG/5ML PO SUSP: 30.0000 mL | Freq: Four times a day (QID) | ORAL | Status: DC | PRN

## 2014-10-10 MED ORDER — INSULIN ASPART 100 UNIT/ML ~~LOC~~ SOLN
0.0000 [IU] | Freq: Three times a day (TID) | SUBCUTANEOUS | Status: DC
  Administered 2014-10-10 – 2014-10-12 (×2): 1 [IU] via SUBCUTANEOUS
  Administered 2014-10-13: 3 [IU] via SUBCUTANEOUS
  Administered 2014-10-14: 5 [IU] via SUBCUTANEOUS
  Administered 2014-10-15: 2 [IU] via SUBCUTANEOUS
  Administered 2014-10-16 (×2): 1 [IU] via SUBCUTANEOUS
  Filled 2014-10-10: qty 2
  Filled 2014-10-10: qty 1
  Filled 2014-10-10: qty 3
  Filled 2014-10-10: qty 1

## 2014-10-10 MED ORDER — INSULIN ASPART 100 UNIT/ML ~~LOC~~ SOLN
0.0000 [IU] | Freq: Every day | SUBCUTANEOUS | Status: DC
  Filled 2014-10-10: qty 5
  Filled 2014-10-10: qty 2

## 2014-10-10 MED ORDER — DOCUSATE SODIUM 50 MG/5ML PO LIQD: 100.0000 mg | Freq: Two times a day (BID) | ORAL | Status: DC

## 2014-10-10 MED ORDER — HYDROMORPHONE HCL 1 MG/ML IJ SOLN
2.0000 mg | Freq: Four times a day (QID) | INTRAMUSCULAR | Status: DC | PRN
  Administered 2014-10-10 – 2014-10-15 (×8): 2 mg via INTRAVENOUS
  Filled 2014-10-10 (×8): qty 2

## 2014-10-10 MED ORDER — GLIPIZIDE 10 MG PO TABS
10.0000 mg | ORAL_TABLET | Freq: Every day | ORAL | Status: DC
  Administered 2014-10-11 – 2014-10-17 (×7): 10 mg via ORAL
  Filled 2014-10-10 (×10): qty 1

## 2014-10-10 MED ORDER — GADOBENATE DIMEGLUMINE 529 MG/ML IV SOLN
10.0000 mL | Freq: Once | INTRAVENOUS | Status: AC | PRN
  Administered 2014-10-10: 5 mL via INTRAVENOUS

## 2014-10-10 MED ORDER — PANTOPRAZOLE SODIUM 40 MG PO TBEC
40.0000 mg | DELAYED_RELEASE_TABLET | Freq: Every day | ORAL | Status: DC
  Administered 2014-10-10 – 2014-10-17 (×8): 40 mg via ORAL
  Filled 2014-10-10 (×8): qty 1

## 2014-10-10 MED ORDER — GABAPENTIN 300 MG PO CAPS
300.0000 mg | ORAL_CAPSULE | Freq: Two times a day (BID) | ORAL | Status: DC
  Administered 2014-10-10 – 2014-10-17 (×14): 300 mg via ORAL
  Filled 2014-10-10 (×14): qty 1

## 2014-10-10 MED ORDER — ACETAMINOPHEN 650 MG RE SUPP: 650.0000 mg | Freq: Four times a day (QID) | RECTAL | Status: DC | PRN

## 2014-10-10 MED ORDER — ENOXAPARIN SODIUM 30 MG/0.3ML ~~LOC~~ SOLN
30.0000 mg | SUBCUTANEOUS | Status: DC
  Administered 2014-10-10: 30 mg via SUBCUTANEOUS
  Filled 2014-10-10: qty 0.3

## 2014-10-10 MED ORDER — ASPIRIN 81 MG PO CHEW
81.0000 mg | CHEWABLE_TABLET | Freq: Every day | ORAL | Status: DC
  Administered 2014-10-11 – 2014-10-17 (×7): 81 mg via ORAL
  Filled 2014-10-10 (×7): qty 1

## 2014-10-10 MED ORDER — AMIODARONE HCL 200 MG PO TABS
200.0000 mg | ORAL_TABLET | Freq: Every day | ORAL | Status: DC
  Administered 2014-10-11 – 2014-10-17 (×7): 200 mg via ORAL
  Filled 2014-10-10 (×7): qty 1

## 2014-10-10 MED ORDER — SIMVASTATIN 40 MG PO TABS
40.0000 mg | ORAL_TABLET | Freq: Every day | ORAL | Status: DC
  Administered 2014-10-11 – 2014-10-17 (×7): 40 mg via ORAL
  Filled 2014-10-10 (×7): qty 1

## 2014-10-10 MED ORDER — PRESERVISION AREDS PO TABS: 1.0000 | ORAL_TABLET | Freq: Two times a day (BID) | ORAL | Status: DC

## 2014-10-10 MED ORDER — DOCUSATE SODIUM 100 MG PO CAPS
100.0000 mg | ORAL_CAPSULE | Freq: Two times a day (BID) | ORAL | Status: DC
  Administered 2014-10-10 – 2014-10-17 (×14): 100 mg via ORAL
  Filled 2014-10-10 (×14): qty 1

## 2014-10-10 NOTE — Consult Note (Signed)
Patient Demographics  Sharon [name], is a 81 y.o. female   MRN: [id number]   DOB - 28-Sep-1933  Admit Date - 10/10/2014    Outpatient Primary MD for the patient is Rusty Aus., MD  Consult requested in the Hospital by Epifanio Lesches, MD, On 10/10/2014    Reason for consult infection left foot secondary to diabetes and diabetic ulceration   With History of -  Past Medical History  Diagnosis Date  . CHF (congestive heart failure)   . Diabetes mellitus without complication   . Hyperlipidemia   . Atrial fibrillation   . Hypothyroid   . Hypertension   . Stroke   . Neuropathy   . Anemia   . Edema     feet/ankles occas      Past Surgical History  Procedure Laterality Date  . Hemiarthroplasty hip Right   . Hemiarthroplasty hip Left   . Cholecystectomy    . Appendectomy    . Cardiac catheterization  08/25/13  . Coronary angioplasty    . Cataract extraction w/phaco Left 07/20/2014    Procedure: CATARACT EXTRACTION PHACO AND INTRAOCULAR LENS PLACEMENT (IOC);  Surgeon: Leandrew Koyanagi, MD;  Location: [facility] ORS;  Service: Ophthalmology;  Laterality: Left;  [place]  1:18                 AP     23.6             CDE   9.69      lot #[id number]    HPI  Sharon [name]  is a 81 y.o. female, seen in the office yesterday with a full-thickness ulceration and bone from the tibial sesamoid area of the left foot exposed in the wound. Return today to the office for reevaluation and had some worsening and drainage swelling and redness and I felt she needed admission at that time frame. Was admitted by emergency department via direct admission.  Review of Systems    In addition to the HPI above,  No Fever-chills, No Headache, No changes with Vision or hearing, No problems swallowing food or Liquids, No Chest pain,  Cough or Shortness of Breath, No Abdominal pain, No Nausea or Vommitting, Bowel movements are regular, No Blood in stool or Urine, No dysuria, No new skin rashes or bruises, No new joints pains-aches,  No new weakness, tingling, numbness in any extremity, No recent weight gain or loss, No polyuria, polydypsia or polyphagia, No significant Mental Stressors.  A full 10 point Review of Systems was done, except as stated above, all other Review of Systems were negative.   Social History   Family History Family History  Problem Relation Age of Onset  . Heart attack Mother      Prior to Admission medications   Medication Sig Start Date End Date Taking? Authorizing Provider  amiodarone (PACERONE) 200 MG tablet Take 200 mg by mouth daily.   Yes Historical Provider, MD  aspirin 81 MG tablet Take 81 mg by mouth daily.   Yes  Historical Provider, MD  Cholecalciferol 1000 UNITS capsule Take 1,000 Units by mouth daily.   Yes Historical Provider, MD  ferrous sulfate 325 (65 FE) MG tablet Take 325 mg by mouth daily with breakfast.   Yes Historical Provider, MD  gabapentin (NEURONTIN) 300 MG capsule Take 300 mg by mouth 2 (two) times daily.   Yes Historical Provider, MD  glipiZIDE (GLUCOTROL) 10 MG tablet Take by mouth daily before breakfast.   Yes Historical Provider, MD  glyBURIDE-metformin (GLUCOVANCE) 5-500 MG per tablet Take 1 tablet by mouth daily with breakfast.   Yes Historical Provider, MD  levothyroxine (SYNTHROID, LEVOTHROID) 112 MCG tablet Take 112 mcg by mouth daily before breakfast.   Yes Historical Provider, MD  lisinopril (PRINIVIL,ZESTRIL) 5 MG tablet Take 5 mg by mouth daily.   Yes Historical Provider, MD  metoprolol tartrate (LOPRESSOR) 25 MG tablet Take 25 mg by mouth at bedtime.    Yes Historical Provider, MD  Multiple Vitamins-Minerals (PRESERVISION AREDS) TABS Take 1 tablet by mouth 2 times daily at 12 noon and 4 pm.   Yes Historical Provider, MD  simvastatin (ZOCOR) 40 MG  tablet Take 40 mg by mouth daily.   Yes Historical Provider, MD  temazepam (RESTORIL) 15 MG capsule Take 15 mg by mouth at bedtime as needed for sleep.   Yes Historical Provider, MD  docusate (COLACE) 50 MG/5ML liquid Take 100 mg by mouth 2 (two) times daily.    Historical Provider, MD  doxycycline (ORACEA) 40 MG capsule Take 40 mg by mouth 2 (two) times daily.    Historical Provider, MD  pantoprazole (PROTONIX) 20 MG tablet Take 20 mg by mouth daily.    Historical Provider, MD    Anti-infectives    None      Scheduled Meds: . amiodarone  200 mg Oral Daily  . aspirin  81 mg Oral Daily  . cholecalciferol  1,000 Units Oral Daily  . docusate sodium  100 mg Oral BID  . enoxaparin (LOVENOX) injection  30 mg Subcutaneous Q24H  . [START ON 10/11/2014] ferrous sulfate  325 mg Oral Q breakfast  . gabapentin  300 mg Oral BID  . [START ON 10/11/2014] glipiZIDE  10 mg Oral QAC breakfast  . [START ON 10/11/2014] levothyroxine  112 mcg Oral QAC breakfast  . lisinopril  5 mg Oral Daily  . metoprolol tartrate  25 mg Oral QHS  . [START ON 10/11/2014] multivitamin-lutein  1 capsule Oral Daily  . pantoprazole  40 mg Oral Daily  . simvastatin  40 mg Oral Daily   Continuous Infusions:  PRN Meds:.acetaminophen **OR** acetaminophen, alum & mag hydroxide-simeth, HYDROmorphone (DILAUDID) injection, temazepam  Allergies  Allergen Reactions  . Codeine Other (See Comments)    unknown  . Cymbalta [Duloxetine Hcl] Other (See Comments)    Confusion, disorientation    Physical Exam  Vitals  Blood pressure 134/59, pulse 80, temperature 98.3 F (36.8 C), temperature source Oral, resp. rate 18, height '5\' 5"'$  (1.651 m), weight 58.514 kg (129 lb), SpO2 93 %.  Lower Extremity exam:  Vascular: DP pulses are trace pedal but difficult to palpate. She has healed well from other wounds in the recent past however. This is on both feet.  Dermatological: Full-thickness ulceration submetatarsal one left foot with  exposed bone particular tibial sesamoid.  Neurological: Noted peripheral neuropathy bilateral  Ortho: Cavovarus foot type with ROM and the plantar displaced first metatarsal on the left foot. Tibial sesamoid is present in the wound. X-rays in the office yesterday  show demineralization and fracture with comminution of the tibial sesamoid. Fibular sesamoid metatarsal head appeared normal.  Data Review  CBC  Recent Labs Lab 10/10/14 1143  WBC 9.5  HGB 10.2*  HCT 32.3*  PLT 343  MCV 82.9  MCH 26.2  MCHC 31.7*  RDW 16.7*   ------------------------------------------------------------------------------------------------------------------  Chemistries   Recent Labs Lab 10/10/14 1143  NA 138  K 3.9  CL 105  CO2 26  GLUCOSE 123*  BUN 21*  CREATININE 1.35*  CALCIUM 8.8*   ------------------------------------------------------------------------------------------------------------------ estimated creatinine clearance is 30.4 mL/min (by C-G formula based on Cr of 1.35). ------------------------------------------------------------------------------------------------------------------ No results for input(s): TSH, T4TOTAL, T3FREE, THYROIDAB in the last 72 hours.  Invalid input(s): FREET3   Coagulation profile No results for input(s): INR, PROTIME in the last 168 hours. ------------------------------------------------------------------------------------------------------------------- No results for input(s): DDIMER in the last 72 hours. -------------------------------------------------------------------------------------------------------------------  Cardiac Enzymes No results for input(s): CKMB, TROPONINI, MYOGLOBIN in the last 168 hours.  Invalid input(s): CK ------------------------------------------------------------------------------------------------------------------ Invalid input(s):  POCBNP   ---------------------------------------------------------------------------------------------------------------  Urinalysis    Component Value Date/Time   COLORURINE Yellow 08/18/2013 0253   APPEARANCEUR Hazy 08/18/2013 0253   LABSPEC 1.012 08/18/2013 0253   PHURINE 5.0 08/18/2013 0253   GLUCOSEU >=500 08/18/2013 0253   HGBUR Negative 08/18/2013 0253   BILIRUBINUR Negative 08/18/2013 0253   KETONESUR Negative 08/18/2013 0253   PROTEINUR 100 mg/dL 08/18/2013 0253   NITRITE Negative 08/18/2013 0253   LEUKOCYTESUR Trace 08/18/2013 0253    Assessment & Plan: Admitted hospital day for IV antibiotics and surgical planning. We will get an MRI today to evaluate whether any other bones in the region are involved besides the tibial sesamoid. Try to surgically clean this up tomorrow.  Active Problems:   Diabetic foot infection     Family Communication: Plan discussed with patient and and her family   Thank you for the consult, we will follow the patient with you in the Hospital.   Perry Mount M.D on 10/10/2014 at 1:27 PM  Thank you for the consult, we will follow the patient with you in the Hospital.

## 2014-10-10 NOTE — Progress Notes (Signed)
Primary RN spoke to Dr. Vianne Bulls in person in regard to pt being in pain and also pt in need for orders for [facility] FSBS.  Orders received.

## 2014-10-10 NOTE — H&P (Signed)
[facility] [facility] NAME: Sharon [name]    MR#:  [id number]  DATE OF BIRTH:  10/28/1933  DATE OF ADMISSION:  10/10/2014  PRIMARY CARE PHYSICIAN: Rusty Aus., MD   REQUESTING/REFERRING PHYSICIAN: Dr. Elvina Mattes  CHIEF COMPLAINT:  No chief complaint on file.   HISTORY OF PRESENT ILLNESS:  Sharon [name]  is a 81 y.o. female with a known history of hypertension, diabetes mellitus, atrial fibrillation, congestive heart failure, neuropathy comes in from Dr. Elvina Mattes office secondary to left foot infection. Patient is been having left foot pain for about the week. No fever, no chills. Asian followed up with Dr. Elvina Mattes in the office yesterday for new to have full-thickness ulcer extending into the bone  on the left foot, concerning this patient is admitted as a direct admit.  PAST MEDICAL HISTORY:   Past Medical History  Diagnosis Date  . CHF (congestive heart failure)   . Diabetes mellitus without complication   . Hyperlipidemia   . Atrial fibrillation   . Hypothyroid   . Hypertension   . Stroke   . Neuropathy   . Anemia   . Edema     feet/ankles occas    PAST SURGICAL HISTOIRY:   Past Surgical History  Procedure Laterality Date  . Hemiarthroplasty hip Right   . Hemiarthroplasty hip Left   . Cholecystectomy    . Appendectomy    . Cardiac catheterization  08/25/13  . Coronary angioplasty    . Cataract extraction w/phaco Left 07/20/2014    Procedure: CATARACT EXTRACTION PHACO AND INTRAOCULAR LENS PLACEMENT (IOC);  Surgeon: Leandrew Koyanagi, MD;  Location: [facility] ORS;  Service: Ophthalmology;  Laterality: Left;  [place]  1:18                 AP     23.6             CDE   9.69      lot #[id number]    SOCIAL HISTORY:   Social History  Substance Use Topics  . Smoking status: Former Smoker -- 0.50 packs/day for 7 years    Types: Cigarettes    Quit date: 05/31/1998  . Smokeless tobacco: Never Used  . Alcohol Use: No    FAMILY  HISTORY:   Family History  Problem Relation Age of Onset  . Heart attack Mother     DRUG ALLERGIES:   Allergies  Allergen Reactions  . Codeine Other (See Comments)    unknown  . Cymbalta [Duloxetine Hcl] Other (See Comments)    Confusion, disorientation    REVIEW OF SYSTEMS:  CONSTITUTIONAL: No fever, fatigue or weakness.  EYES: No blurred or double vision.  EARS, NOSE, AND THROAT: No tinnitus or ear pain.  RESPIRATORY: No cough, shortness of breath, wheezing or hemoptysis.  CARDIOVASCULAR: No chest pain, orthopnea, edema.  GASTROINTESTINAL: No nausea, vomiting, diarrhea or abdominal pain.  GENITOURINARY: No dysuria, hematuria.  ENDOCRINE: No polyuria, nocturia,  HEMATOLOGY: No anemia, easy bruising or bleeding SKIN: No rash or lesion. MUSCULOSKELETAL' left foot pain present . NEUROLOGIC: No tingling, numbness, weakness.  PSYCHIATRY: No anxiety or depression.   MEDICATIONS AT HOME:   Prior to Admission medications   Medication Sig Start Date End Date Taking? Authorizing Provider  amiodarone (PACERONE) 200 MG tablet Take 200 mg by mouth daily.   Yes Historical Provider, MD  aspirin 81 MG tablet Take 81 mg by mouth daily.   Yes Historical Provider, MD  Cholecalciferol 1000 UNITS  capsule Take 1,000 Units by mouth daily.   Yes Historical Provider, MD  ferrous sulfate 325 (65 FE) MG tablet Take 325 mg by mouth daily with breakfast.   Yes Historical Provider, MD  gabapentin (NEURONTIN) 300 MG capsule Take 300 mg by mouth 2 (two) times daily.   Yes Historical Provider, MD  glipiZIDE (GLUCOTROL) 10 MG tablet Take by mouth daily before breakfast.   Yes Historical Provider, MD  glyBURIDE-metformin (GLUCOVANCE) 5-500 MG per tablet Take 1 tablet by mouth daily with breakfast.   Yes Historical Provider, MD  levothyroxine (SYNTHROID, LEVOTHROID) 112 MCG tablet Take 112 mcg by mouth daily before breakfast.   Yes Historical Provider, MD  lisinopril (PRINIVIL,ZESTRIL) 5 MG tablet Take 5 mg  by mouth daily.   Yes Historical Provider, MD  metoprolol tartrate (LOPRESSOR) 25 MG tablet Take 25 mg by mouth at bedtime.    Yes Historical Provider, MD  Multiple Vitamins-Minerals (PRESERVISION AREDS) TABS Take 1 tablet by mouth 2 times daily at 12 noon and 4 pm.   Yes Historical Provider, MD  simvastatin (ZOCOR) 40 MG tablet Take 40 mg by mouth daily.   Yes Historical Provider, MD  temazepam (RESTORIL) 15 MG capsule Take 15 mg by mouth at bedtime as needed for sleep.   Yes Historical Provider, MD  docusate (COLACE) 50 MG/5ML liquid Take 100 mg by mouth 2 (two) times daily.    Historical Provider, MD  doxycycline (ORACEA) 40 MG capsule Take 40 mg by mouth 2 (two) times daily.    Historical Provider, MD  pantoprazole (PROTONIX) 20 MG tablet Take 20 mg by mouth daily.    Historical Provider, MD      VITAL SIGNS:  Blood pressure 134/59, pulse 80, temperature 98.3 F (36.8 C), temperature source Oral, resp. rate 18, height '5\' 5"'$  (1.651 m), weight 58.514 kg (129 lb), SpO2 93 %.  PHYSICAL EXAMINATION:  GENERAL:  81 y.o.-year-old patient lying in the bed with no acute distress.  EYES: Pupils equal, round, reactive to light and accommodation. No scleral icterus. Extraocular muscles intact.  HEENT: Head atraumatic, normocephalic. Oropharynx and nasopharynx clear.  NECK:  Supple, no jugular venous distention. No thyroid enlargement, no tenderness.  LUNGS: Normal breath sounds bilaterally, no wheezing, rales,rhonchi or crepitation. No use of accessory muscles of respiration.  CARDIOVASCULAR: S1, S2 normal. No murmurs, rubs, or gallops.  ABDOMEN: Soft, nontender, nondistended. Bowel sounds present. No organomegaly or mass.  EXTREMITIES: Full thickness ulcer present on the left forward exposing the bone. DP pulses are trace in the left foot.  NEUROLOGIC: Cranial nerves II through XII are intact. Muscle strength 5/5 in all extremities. Sensation intact. Gait not checked.  PSYCHIATRIC: The patient is  alert and oriented x 3.  SKIN: No obvious rash, lesion, or ulcer.   LABORATORY PANEL:   CBC  Recent Labs Lab 10/10/14 1143  WBC 9.5  HGB 10.2*  HCT 32.3*  PLT 343   ------------------------------------------------------------------------------------------------------------------  Chemistries   Recent Labs Lab 10/10/14 1143  NA 138  K 3.9  CL 105  CO2 26  GLUCOSE 123*  BUN 21*  CREATININE 1.35*  CALCIUM 8.8*   ------------------------------------------------------------------------------------------------------------------  Cardiac Enzymes No results for input(s): TROPONINI in the last 168 hours. ------------------------------------------------------------------------------------------------------------------  RADIOLOGY:  No results found.  EKG:   Orders placed or performed during the hospital encounter of 07/10/14  . EKG 12-Lead  . EKG 12-Lead    IMPRESSION AND PLAN:   1. Diabetic foot infection: Admitted to medical unit, start IV Vanco  and Zosyn, MRI of the left foot to evaluate for osteomyelitis: Consult podiatry. Continue pain medications 2 hypertension: Continue her medications with lisinopril, metoprolol. #3. Diabetes mellitus type 2: Continue sliding scale coverage, Glucotrol 10 mg daily but hold the glyburide and metformin. .#4 Hypothyroidism continue Synthroid  5. Paroxysmal atrial fibrillation continue aspirin, amiodarone. #6 history of peripheral neuropathy continue Neurontin.  All the records are reviewed and case discussed with ED provider. Management plans discussed with the patient, family and they are in agreement.  CODE STATUS: full  TOTAL TIME TAKING CARE OF THIS PATIENT: 55 minutes.    Epifanio Lesches M.D on 10/10/2014 at 2:19 PM  Between 7am to 6pm - Pager - [phone]  After 6pm go to [email or web address] - password [email or web address]  [facility]  Office  [phone]  CC: Primary care physician; Rusty Aus.,  MD

## 2014-10-11 ENCOUNTER — Inpatient Hospital Stay: Payer: Medicare Other | Admitting: Anesthesiology

## 2014-10-11 ENCOUNTER — Encounter
Admission: AD | Disposition: A | Discharge status: Skilled Nursing Facility | Payer: Self-pay | Source: Ambulatory Visit | Attending: Internal Medicine

## 2014-10-11 ENCOUNTER — Encounter: Payer: Self-pay | Admitting: Podiatry

## 2014-10-11 HISTORY — PX: INCISION AND DRAINAGE: SHX5863

## 2014-10-11 LAB — CBC
HCT: 33.3 % — ABNORMAL LOW (ref 35.0–47.0)
Hemoglobin: 10.3 g/dL — ABNORMAL LOW (ref 12.0–16.0)
MCH: 26 pg (ref 26.0–34.0)
MCHC: 31.1 g/dL — ABNORMAL LOW (ref 32.0–36.0)
MCV: 83.7 fL (ref 80.0–100.0)
PLATELETS: 350 10*3/uL (ref 150–440)
RBC: 3.97 MIL/uL (ref 3.80–5.20)
RDW: 17 % — AB (ref 11.5–14.5)
WBC: 8.2 10*3/uL (ref 3.6–11.0)

## 2014-10-11 LAB — GLUCOSE, CAPILLARY
GLUCOSE-CAPILLARY: 107 mg/dL — AB (ref 65–99)
GLUCOSE-CAPILLARY: 102 mg/dL — AB (ref 65–99)
Glucose-Capillary: 178 mg/dL — ABNORMAL HIGH (ref 65–99)
Glucose-Capillary: 73 mg/dL (ref 65–99)
Glucose-Capillary: 108 mg/dL — ABNORMAL HIGH (ref 65–99)

## 2014-10-11 LAB — BASIC METABOLIC PANEL
Anion gap: 8 (ref 5–15)
BUN: 21 mg/dL — ABNORMAL HIGH (ref 6–20)
CALCIUM: 8.9 mg/dL (ref 8.9–10.3)
CO2: 25 mmol/L (ref 22–32)
CREATININE: 1.13 mg/dL — AB (ref 0.44–1.00)
Chloride: 104 mmol/L (ref 101–111)
GFR calc non Af Amer: 45 mL/min — ABNORMAL LOW (ref >60)
GFR, EST AFRICAN AMERICAN: 52 mL/min — AB (ref >60)
Glucose, Bld: 87 mg/dL (ref 65–99)
Potassium: 4.1 mmol/L (ref 3.5–5.1)
SODIUM: 137 mmol/L (ref 135–145)

## 2014-10-11 SURGERY — INCISION AND DRAINAGE
Anesthesia: Monitor Anesthesia Care | Laterality: Left | Wound class: Dirty or Infected

## 2014-10-11 MED ORDER — VANCOMYCIN HCL 1000 MG IV SOLR
INTRAVENOUS | Status: AC
  Filled 2014-10-11: qty 1000

## 2014-10-11 MED ORDER — BUPIVACAINE HCL (PF) 0.5 % IJ SOLN
INTRAMUSCULAR | Status: AC
  Filled 2014-10-11: qty 30

## 2014-10-11 MED ORDER — BUPIVACAINE HCL (PF) 0.5 % IJ SOLN
INTRAMUSCULAR | Status: DC | PRN
  Administered 2014-10-11: 4 mL

## 2014-10-11 MED ORDER — ONDANSETRON HCL 4 MG/2ML IJ SOLN: 4.0000 mg | Freq: Once | INTRAMUSCULAR | Status: DC | PRN

## 2014-10-11 MED ORDER — LIDOCAINE HCL (PF) 1 % IJ SOLN
INTRAMUSCULAR | Status: AC
  Filled 2014-10-11: qty 30

## 2014-10-11 MED ORDER — MIDAZOLAM HCL 5 MG/5ML IJ SOLN
INTRAMUSCULAR | Status: DC | PRN
  Administered 2014-10-11: 1 mg via INTRAVENOUS

## 2014-10-11 MED ORDER — LIDOCAINE HCL (PF) 1 % IJ SOLN
INTRAMUSCULAR | Status: DC | PRN
  Administered 2014-10-11: 4 mL

## 2014-10-11 MED ORDER — PROPOFOL 10 MG/ML IV BOLUS
INTRAVENOUS | Status: DC | PRN
  Administered 2014-10-11: 25 mg via INTRAVENOUS

## 2014-10-11 MED ORDER — MORPHINE SULFATE (PF) 2 MG/ML IV SOLN
1.0000 mg | INTRAVENOUS | Status: DC | PRN
  Administered 2014-10-12: 1 mg via INTRAVENOUS
  Filled 2014-10-11: qty 1

## 2014-10-11 MED ORDER — SODIUM CHLORIDE 0.9 % IV SOLN
INTRAVENOUS | Status: DC | PRN
  Administered 2014-10-11: 13:00:00 via INTRAVENOUS

## 2014-10-11 MED ORDER — GENTAMICIN SULFATE 40 MG/ML IJ SOLN
INTRAMUSCULAR | Status: AC
  Filled 2014-10-11: qty 6

## 2014-10-11 MED ORDER — PROPOFOL 500 MG/50ML IV EMUL
INTRAVENOUS | Status: DC | PRN
  Administered 2014-10-11: 50 ug/kg/min via INTRAVENOUS

## 2014-10-11 MED ORDER — FENTANYL CITRATE (PF) 100 MCG/2ML IJ SOLN
INTRAMUSCULAR | Status: DC | PRN
  Administered 2014-10-11 (×4): 25 ug via INTRAVENOUS

## 2014-10-11 MED ORDER — ENOXAPARIN SODIUM 40 MG/0.4ML ~~LOC~~ SOLN
40.0000 mg | SUBCUTANEOUS | Status: DC
  Administered 2014-10-11 – 2014-10-16 (×6): 40 mg via SUBCUTANEOUS
  Filled 2014-10-11 (×6): qty 0.4

## 2014-10-11 MED ORDER — PHENYLEPHRINE HCL 10 MG/ML IJ SOLN
INTRAMUSCULAR | Status: DC | PRN
  Administered 2014-10-11: 150 ug via INTRAVENOUS
  Administered 2014-10-11: 100 ug via INTRAVENOUS
  Administered 2014-10-11: 200 ug via INTRAVENOUS
  Administered 2014-10-11: 100 ug via INTRAVENOUS
  Administered 2014-10-11: 200 ug via INTRAVENOUS

## 2014-10-11 MED ORDER — VANCOMYCIN HCL IN DEXTROSE 1-5 GM/200ML-% IV SOLN
1000.0000 mg | INTRAVENOUS | Status: DC
  Administered 2014-10-11 – 2014-10-13 (×3): 1000 mg via INTRAVENOUS
  Filled 2014-10-11 (×5): qty 200

## 2014-10-11 MED ORDER — FENTANYL CITRATE (PF) 100 MCG/2ML IJ SOLN: 25.0000 ug | INTRAMUSCULAR | Status: DC | PRN

## 2014-10-11 MED ORDER — VANCOMYCIN HCL IN DEXTROSE 750-5 MG/150ML-% IV SOLN: 750.0000 mg | Freq: Two times a day (BID) | INTRAVENOUS | Status: DC

## 2014-10-11 MED ORDER — SODIUM CHLORIDE 0.45 % IV SOLN
INTRAVENOUS | Status: DC
  Administered 2014-10-11 – 2014-10-13 (×5): via INTRAVENOUS

## 2014-10-11 SURGICAL SUPPLY — 39 items
BAG COUNTER SPONGE EZ (MISCELLANEOUS) ×4 IMPLANT
BANDAGE ELASTIC 3 CLIP NS LF (GAUZE/BANDAGES/DRESSINGS) IMPLANT
BANDAGE ELASTIC 4 CLIP NS LF (GAUZE/BANDAGES/DRESSINGS) ×3 IMPLANT
BANDAGE STRETCH 3X4.1 STRL (GAUZE/BANDAGES/DRESSINGS) ×3 IMPLANT
BNDG ESMARK 4X12 TAN STRL LF (GAUZE/BANDAGES/DRESSINGS) ×3 IMPLANT
BNDG GAUZE 4.5X4.1 6PLY STRL (MISCELLANEOUS) ×3 IMPLANT
CANISTER SUCT 1200ML W/VALVE (MISCELLANEOUS) ×3 IMPLANT
COUNTER SPONGE BAG EZ (MISCELLANEOUS) ×2
CUFF TOURN DUAL PL 12 NO SLV (MISCELLANEOUS) ×3 IMPLANT
DRAPE FLUOR MINI C-ARM 54X84 (DRAPES) ×3 IMPLANT
DURAPREP 26ML APPLICATOR (WOUND CARE) ×3 IMPLANT
GAUZE PETRO XEROFOAM 1X8 (MISCELLANEOUS) ×3 IMPLANT
GAUZE SPONGE 4X4 12PLY STRL (GAUZE/BANDAGES/DRESSINGS) ×3 IMPLANT
GLOVE BIO SURGEON STRL SZ8 (GLOVE) ×15 IMPLANT
GLOVE INDICATOR 7.5 STRL GRN (GLOVE) IMPLANT
GOWN STRL REUS W/ TWL LRG LVL3 (GOWN DISPOSABLE) ×1 IMPLANT
GOWN STRL REUS W/TWL LRG LVL3 (GOWN DISPOSABLE) ×2
KIT RM TURNOVER STRD PROC AR (KITS) ×3 IMPLANT
KIT STIMULAN RAPID CURE 5CC (Orthopedic Implant) ×3 IMPLANT
LABEL OR SOLS (LABEL) IMPLANT
NEEDLE FILTER BLUNT 18X 1/2SAF (NEEDLE) ×4
NEEDLE FILTER BLUNT 18X1 1/2 (NEEDLE) ×2 IMPLANT
NEEDLE HYPO 25X1 1.5 SAFETY (NEEDLE) ×3 IMPLANT
NS IRRIG 500ML POUR BTL (IV SOLUTION) ×3 IMPLANT
PACK EXTREMITY ARMC (MISCELLANEOUS) ×3 IMPLANT
PAD ABD DERMACEA PRESS 5X9 (GAUZE/BANDAGES/DRESSINGS) ×3 IMPLANT
PAD GROUND ADULT SPLIT (MISCELLANEOUS) ×3 IMPLANT
STOCKINETTE STRL 6IN 960660 (GAUZE/BANDAGES/DRESSINGS) ×3 IMPLANT
SUT ETHILON 3-0 FS-10 30 BLK (SUTURE) ×3
SUT ETHILON 4-0 (SUTURE) ×2
SUT ETHILON 4-0 FS2 18XMFL BLK (SUTURE) ×1
SUT VIC AB 3-0 SH 27 (SUTURE) ×2
SUT VIC AB 3-0 SH 27X BRD (SUTURE) ×1 IMPLANT
SUT VIC AB 4-0 FS2 27 (SUTURE) IMPLANT
SUTURE EHLN 3-0 FS-10 30 BLK (SUTURE) ×1 IMPLANT
SUTURE ETHLN 4-0 FS2 18XMF BLK (SUTURE) ×1 IMPLANT
SWAB DUAL CULTURE TRANS RED ST (MISCELLANEOUS) ×3 IMPLANT
SYR 3ML LL SCALE MARK (SYRINGE) ×6 IMPLANT
SYRINGE 10CC LL (SYRINGE) ×6 IMPLANT

## 2014-10-11 NOTE — Transfer of Care (Signed)
Immediate Anesthesia Transfer of Care Note  Patient: Sharon [name]  Procedure(s) Performed: Procedure(s): Removal of infected tibial sessmoid (Left)  Patient Location: PACU  Anesthesia Type:MAC  Level of Consciousness: awake, alert  and oriented  Airway & Oxygen Therapy: Patient Spontanous Breathing and Patient connected to nasal cannula oxygen  Post-op Assessment: Report given to RN and Post -op Vital signs reviewed and unstable, Anesthesiologist notified  Post vital signs: Reviewed and stable  Last Vitals:  Filed Vitals:   10/11/14 1325  BP:   Pulse:   Temp: 36.9 C  Resp:     Complications: No apparent anesthesia complications

## 2014-10-11 NOTE — Op Note (Signed)
Operative note   Surgeon: Dr. Albertine Patricia, DPM.    Assistant: None    Preop diagnosis: Osteomyelitis to tibial sesamoid bone left first metatarsal region    Postop diagnosis: Same    Procedure:   1. Excision of infected tibial sesamoid left foot          EBL: 25 cc    Anesthesia:IV sedation with local    Hemostasis: Ankle tourniquet 250 mmHg pressure for 17 minutes    Specimen: Bone was sent for culture and sensitivity from the tibial sesamoid. The residual tibial sesamoid bone was sent to pathology for evaluation.    Complications: None    Operative indications: Diabetic ulceration with infection and evidence both clinically and radiographically of osteomyelitis to the tibial sesamoid.    Procedure:  Patient was brought into the OR and placed on the operating table in thesupine position. After anesthesia was obtained theleft lower extremity was prepped and draped in usual sterile fashion.  Operative Report: This time attention was directed to the plantar aspect of the left foot at the submetatarsal 1 area. An ulceration is present here approximately 1.8 cm diameter. Some mild purulence was drained from the region along with bone present in the wound is well. Incision was made distally and proximally to lengthen approximately 3 cm. Sharp dissection was used to remove the devitalized tissue from the wound edges and incision was carried down to the tibial sesamoid bone was are exposed. The long tendon was split and the tibial sesamoid was dissected away. This time it was seen to be in multiple fragments and this was cultured and sent to pathology for evaluation. Once the sesamoid was removed in its entirety the metatarsal head and the fibular sesamoid were inspected and seen to be clear and showed no evidence of degenerative change. At this time attention was directed dorsally where an extensor tendon release was performed with a percutaneous Beaver blade. This was to balance out the  contractures of the MTPJ which is present even preoperatively. After copious irrigation with normal saline and antibiotic beads were packed into the wound laced with both gentamicin and vancomycin. A sterile dressing was applied across there is consisting of Xeroform gauze 4 x 4's Kling, and forearm Kerlix and an APD pad and Ace wrap. Patient tolerated procedure and anesthesia well.    Patient tolerated the procedure and anesthesia well.  Was transported from the OR to the PACU with all vital signs stable and vascular status intact. To be discharged per routine protocol.  Will follow up in approximately 1 week in the outpatient clinic.

## 2014-10-11 NOTE — Progress Notes (Signed)
Applied gauze dsg to skin tear  Site clean

## 2014-10-11 NOTE — Progress Notes (Signed)
Pt complained of pain in toe, notified Dr. Emily Filbert. Dr ordered 1 mg morphine Q4 prn. Also asked regarding IV fluids due to NPO status. Dr ordered 1/2 NS at 75 ml/hr. Also asked about giving morning meds or strict NPO, Dr ordered strict NPO to hold morning meds. Adah Salvage, RN

## 2014-10-11 NOTE — H&P (Signed)
H and P has been reviewed and no changes are noted.  

## 2014-10-11 NOTE — Anesthesia Postprocedure Evaluation (Signed)
  Anesthesia Post-op Note  Patient: Sharon [name]  Procedure(s) Performed: Procedure(s): Removal of infected tibial sessmoid (Left)  Anesthesia type:MAC  Patient location: PACU  Post pain: Pain level controlled  Post assessment: Post-op Vital signs reviewed, Patient's Cardiovascular Status Stable, Respiratory Function Stable, Patent Airway and No signs of Nausea or vomiting  Post vital signs: Reviewed and stable  Last Vitals:  Filed Vitals:   10/11/14 1325  BP:   Pulse:   Temp: 36.9 C  Resp:     Level of consciousness: awake, alert  and patient cooperative  Complications: No apparent anesthesia complications

## 2014-10-11 NOTE — Progress Notes (Signed)
Sharon [name] is a 81 y.o. female   SUBJECTIVE:  Patient admitted with cellulitis of the left lower leg with osteomyelitis of the ankle, no complaints this morning.  ______________________________________________________________________  ROS: Review of systems is unremarkable for any active cardiac,respiratory, GI, GU, hematologic, neurologic or psychiatric systems, 10 systems reviewed.  Marland Kitchen amiodarone  200 mg Oral Daily  . aspirin  81 mg Oral Daily  . cholecalciferol  1,000 Units Oral Daily  . docusate sodium  100 mg Oral BID  . enoxaparin (LOVENOX) injection  30 mg Subcutaneous Q24H  . ferrous sulfate  325 mg Oral Q breakfast  . gabapentin  300 mg Oral BID  . glipiZIDE  10 mg Oral QAC breakfast  . insulin aspart  0-5 Units Subcutaneous QHS  . insulin aspart  0-9 Units Subcutaneous TID WC  . insulin aspart  3 Units Subcutaneous TID WC  . levothyroxine  112 mcg Oral QAC breakfast  . lisinopril  5 mg Oral Daily  . metoprolol tartrate  25 mg Oral QHS  . multivitamin-lutein  1 capsule Oral Daily  . pantoprazole  40 mg Oral Daily  . simvastatin  40 mg Oral Daily   acetaminophen **OR** acetaminophen, alum & mag hydroxide-simeth, HYDROmorphone (DILAUDID) injection, temazepam   Past Medical History  Diagnosis Date  . CHF (congestive heart failure)   . Diabetes mellitus without complication   . Hyperlipidemia   . Atrial fibrillation   . Hypothyroid   . Hypertension   . Stroke   . Neuropathy   . Anemia   . Edema     feet/ankles occas    Past Surgical History  Procedure Laterality Date  . Hemiarthroplasty hip Right   . Hemiarthroplasty hip Left   . Cholecystectomy    . Appendectomy    . Cardiac catheterization  08/25/13  . Coronary angioplasty    . Cataract extraction w/phaco Left 07/20/2014    Procedure: CATARACT EXTRACTION PHACO AND INTRAOCULAR LENS PLACEMENT (IOC);  Surgeon: Leandrew Koyanagi, MD;  Location: [facility] ORS;  Service: Ophthalmology;  Laterality: Left;  [place]   1:18                 AP     23.6             CDE   9.69      lot #[id number]    PHYSICAL EXAM:  BP 155/50 mmHg  Pulse 77  Temp(Src) 98.2 F (36.8 C) (Oral)  Resp 18  Ht '5\' 5"'$  (1.651 m)  Wt 58.514 kg (129 lb)  BMI 21.47 kg/m2  SpO2 98%  Wt Readings from Last 3 Encounters:  10/10/14 58.514 kg (129 lb)  10/10/14 58.514 kg (129 lb)  08/22/14 61.236 kg (135 lb)           BP Readings from Last 3 Encounters:  10/10/14 155/50  08/22/14 154/57  07/20/14 147/37    Constitutional: NAD Neck: supple, no thyromegaly Respiratory: CTA, no rales or wheezes Cardiovascular: IRR, no murmur, no gallop Abdomen: soft, good BS, nontender Extremities: no edema, erythema to mid left shin, ulcer left ankle Neuro: alert , not oriented, no focal motor or sensory deficits  ASSESSMENT/PLAN:  Labs and imaging studies were reviewed  Ankle osteomyelitis/cellulitis-IV antibiotics, surgical debridement today, low surgical risk  Severe peripheral neuropathy-patient unable to feel her feet, gabapentin Diabetes mellitus-sliding scale with OHA A. fib-rate controlled, continue medications History GI bleed-continue iron, follow hemoglobin Dementia-mild, chronic

## 2014-10-11 NOTE — Progress Notes (Signed)
Anticoagulation monitoring  81 yo female currently ordered enoxaparin 30 mg SQ q24h for DVT ppx  SCr 1.35>1.13, CrCl 36.3 ml/min Hgb 10.3, Plt 350 (stable) Wt 58.5 kg, IBW 57 kg  Based on improved CrCl >30 ml/min, will change dose to enoxaparin 40 mg SQ q24h.   Pharmacy will continue to monitor.    Rayna Sexton, PharmD, BCPS Clinical Pharmacist 10/11/2014 8:26 AM

## 2014-10-11 NOTE — Progress Notes (Signed)
ANTIBIOTIC CONSULT NOTE - INITIAL  Pharmacy Consult for Vancomycin Indication: Osetomyelitis  Allergies  Allergen Reactions  . Codeine Other (See Comments)    unknown  . Cymbalta [Duloxetine Hcl] Other (See Comments)    Confusion, disorientation    Patient Measurements: Height: '5\' 5"'$  (165.1 cm) Weight: 129 lb (58.514 kg) IBW/kg (Calculated) : 57   Vital Signs: Temp: 98.2 F (36.8 C) (09/27 2022) Temp Source: Oral (09/27 2022) BP: 155/50 mmHg (09/27 2022) Pulse Rate: 77 (09/27 2022) Intake/Output from previous day: 09/27 0701 - 09/28 0700 In: 0  Out: 500 [Urine:500] Intake/Output from this shift:    Labs:  Recent Labs  10/10/14 1143 10/11/14 0553  WBC 9.5 8.2  HGB 10.2* 10.3*  PLT 343 350  CREATININE 1.35* 1.13*   Estimated Creatinine Clearance: 36.3 mL/min (by C-G formula based on Cr of 1.13). No results for input(s): VANCOTROUGH, VANCOPEAK, VANCORANDOM, GENTTROUGH, GENTPEAK, GENTRANDOM, TOBRATROUGH, TOBRAPEAK, TOBRARND, AMIKACINPEAK, AMIKACINTROU, AMIKACIN in the last 72 hours.   Microbiology: Recent Results (from the past 720 hour(s))  Wound culture     Status: None (Preliminary result)   Collection Time: 10/09/14  3:00 PM  Result Value Ref Range Status   Specimen Description FOOT LEFT  Final   Special Requests NONE  Final   Gram Stain   Final    RARE WBC SEEN FEW GRAM POSITIVE COCCI IN CLUSTERS    Culture   Final    LIGHT GROWTH STAPHYLOCOCCUS AUREUS SUSCEPTIBILITIES TO FOLLOW    Report Status PENDING  Incomplete    Medical History: Past Medical History  Diagnosis Date  . CHF (congestive heart failure)   . Diabetes mellitus without complication   . Hyperlipidemia   . Atrial fibrillation   . Hypothyroid   . Hypertension   . Stroke   . Neuropathy   . Anemia   . Edema     feet/ankles occas    Assessment: 81 yo female here with cellulitis and osteomyelitis of left lower extremity. Plan for surgical debridement today.   9/26 WCx with  light growth staph aureus, sensitivities pending  Ke 0.035, half life 19.8 h, Vd 41 L  Goal of Therapy:  Vancomycin trough level 15-20 mcg/ml  Plan:  Will order vancomycin 1 g IV q24h Trough before 4th dose 10/1 at 0800 Will need to continue to monitor renal function and culture results  Rayna Sexton L 10/11/2014,8:06 AM

## 2014-10-11 NOTE — Anesthesia Preprocedure Evaluation (Addendum)
Anesthesia Evaluation  Patient identified by MRN, date of birth, ID band Patient awake    Reviewed: Allergy & Precautions, NPO status , Patient's Chart, lab work & pertinent test results  Airway Mallampati: II       Dental no notable dental hx. (+) Teeth Intact   Pulmonary former smoker,     + decreased breath sounds      Cardiovascular hypertension, Pt. on home beta blockers +CHF  Normal cardiovascular exam Rhythm:Regular     Neuro/Psych CVA    GI/Hepatic negative GI ROS, Neg liver ROS,   Endo/Other  diabetes, Type 2, Oral Hypoglycemic AgentsHypothyroidism   Renal/GU negative Renal ROS     Musculoskeletal negative musculoskeletal ROS (+)   Abdominal Normal abdominal exam  (+)   Peds  Hematology negative hematology ROS (+) anemia ,   Anesthesia Other Findings   Reproductive/Obstetrics negative OB ROS                            Anesthesia Physical Anesthesia Plan  ASA: III  Anesthesia Plan: MAC   Post-op Pain Management:    Induction:   Airway Management Planned: Nasal Cannula  Additional Equipment:   Intra-op Plan:   Post-operative Plan:   Informed Consent: I have reviewed the patients History and Physical, chart, labs and discussed the procedure including the risks, benefits and alternatives for the proposed anesthesia with the patient or authorized representative who has indicated his/her understanding and acceptance.     Plan Discussed with: CRNA  Anesthesia Plan Comments:         Anesthesia Quick Evaluation

## 2014-10-12 LAB — GLUCOSE, CAPILLARY
GLUCOSE-CAPILLARY: 84 mg/dL (ref 65–99)
GLUCOSE-CAPILLARY: 138 mg/dL — AB (ref 65–99)
Glucose-Capillary: 141 mg/dL — ABNORMAL HIGH (ref 65–99)

## 2014-10-12 MED ORDER — DEXTROSE 5 % IV SOLN
2.0000 g | INTRAVENOUS | Status: DC
  Administered 2014-10-12: 2 g via INTRAVENOUS
  Filled 2014-10-12 (×2): qty 2

## 2014-10-12 MED ORDER — DEXTROSE 5 % IV SOLN: 1.0000 g | INTRAVENOUS | Status: DC

## 2014-10-12 NOTE — Progress Notes (Signed)
Patient ID: Sharon [name], female   DOB: 05/22/1933, 81 y.o.   MRN: [id number] Sharon [name] is a 81 y.o. female   SUBJECTIVE:  Patient admitted with cellulitis of the left lower leg with osteomyelitis of the ankle, no complaints this morning. Postop, mild pain  ______________________________________________________________________  ROS: Review of systems is unremarkable for any active cardiac,respiratory, GI, GU, hematologic, neurologic or psychiatric systems, 10 systems reviewed.  Marland Kitchen amiodarone  200 mg Oral Daily  . aspirin  81 mg Oral Daily  . cefTRIAXone (ROCEPHIN)  IV  1 g Intravenous Q24H  . cholecalciferol  1,000 Units Oral Daily  . docusate sodium  100 mg Oral BID  . enoxaparin (LOVENOX) injection  40 mg Subcutaneous Q24H  . ferrous sulfate  325 mg Oral Q breakfast  . gabapentin  300 mg Oral BID  . glipiZIDE  10 mg Oral QAC breakfast  . insulin aspart  0-5 Units Subcutaneous QHS  . insulin aspart  0-9 Units Subcutaneous TID WC  . insulin aspart  3 Units Subcutaneous TID WC  . levothyroxine  112 mcg Oral QAC breakfast  . lisinopril  5 mg Oral Daily  . metoprolol tartrate  25 mg Oral QHS  . multivitamin-lutein  1 capsule Oral Daily  . pantoprazole  40 mg Oral Daily  . simvastatin  40 mg Oral Daily  . vancomycin  1,000 mg Intravenous Q24H   acetaminophen **OR** acetaminophen, alum & mag hydroxide-simeth, fentaNYL (SUBLIMAZE) injection, HYDROmorphone (DILAUDID) injection, morphine injection, ondansetron (ZOFRAN) IV, temazepam   Past Medical History  Diagnosis Date  . CHF (congestive heart failure)   . Diabetes mellitus without complication   . Hyperlipidemia   . Atrial fibrillation   . Hypothyroid   . Hypertension   . Stroke   . Neuropathy   . Anemia   . Edema     feet/ankles occas    Past Surgical History  Procedure Laterality Date  . Hemiarthroplasty hip Right   . Hemiarthroplasty hip Left   . Cholecystectomy    . Appendectomy    . Cardiac  catheterization  08/25/13  . Coronary angioplasty    . Cataract extraction w/phaco Left 07/20/2014    Procedure: CATARACT EXTRACTION PHACO AND INTRAOCULAR LENS PLACEMENT (IOC);  Surgeon: Leandrew Koyanagi, MD;  Location: [facility] ORS;  Service: Ophthalmology;  Laterality: Left;  [place]  1:18                 AP     23.6             CDE   9.69      lot #[id number]  . Incision and drainage Left 10/11/2014    Procedure: Removal of infected tibial sessmoid;  Surgeon: Albertine Patricia, DPM;  Location: [facility] ORS;  Service: Podiatry;  Laterality: Left;    PHYSICAL EXAM:  BP 166/61 mmHg  Pulse 74  Temp(Src) 98 F (36.7 C) (Oral)  Resp 14  Ht '5\' 5"'$  (1.651 m)  Wt 58.514 kg (129 lb)  BMI 21.47 kg/m2  SpO2 98%  Wt Readings from Last 3 Encounters:  10/10/14 58.514 kg (129 lb)  10/10/14 58.514 kg (129 lb)  08/22/14 61.236 kg (135 lb)           BP Readings from Last 3 Encounters:  10/11/14 166/61  08/22/14 154/57  07/20/14 147/37    Constitutional: NAD Neck: supple, no thyromegaly Respiratory: CTA, no rales or wheezes Cardiovascular: IRR, no murmur, no gallop Abdomen: soft, good BS, nontender Extremities: no edema, erythema  to mid left shin, ulcer left ankle Neuro: alert , not oriented, no focal motor or sensory deficits  ASSESSMENT/PLAN:  Labs and imaging studies were reviewed  Ankle osteomyelitis/cellulitis-IV antibiotics, post surgical debridement, surgical care, cultures pending, IV Rocephin/vancomycin Severe peripheral neuropathy-patient unable to feel her feet, gabapentin Diabetes mellitus-sliding scale with OHA, stable A. fib-rate controlled, continue medications History GI bleed-continue iron, follow hemoglobin Dementia-mild, chronic Likely will need skilled placement, physical therapy, social work

## 2014-10-12 NOTE — Progress Notes (Signed)
Per his request, I called pt's son [name] to speak with him re questions and concerns that he had re his mother's care; per pt son request, I notified Brayton Layman, Education officer, museum, of his request for her to call him re discharge planning

## 2014-10-12 NOTE — Progress Notes (Signed)
PT Cancellation Note  Patient Details Name: Sharon [name] MRN: [id number] DOB: 06-Feb-1933  Pt History: Pt is a 81yo female presenting postop related to removal of tibial first met sesamoid and extensor release. 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 Spoke to Dr. Elvina Mattes on phone to clarify Weighty Bearing Status. He has recommended WBAT in ortho wedge boot only. Of note: Dr. Elvina Mattes also mentions that patient is very weak and like only able to tolerate transfers at this time. Will attempt PT evaluation at later time once pt has received ortho wedge boot. RN informed and will call once equipment has arrived.    Buccola,Allan C 10/12/2014, 10:55 AM  10:57 AM  Etta Grandchild, PT, DPT [place] License # [id number]

## 2014-10-12 NOTE — Evaluation (Signed)
Physical Therapy Evaluation Patient Details Name: Sharon [name] MRN: [id number] DOB: July 09, 1933 Today's Date: 10/12/2014   History of Present Illness  Pt is a 81yo white female with PMH DM, who presented to [facility] with full thickness wound on plantar surface of the L foot near the 1st MTP. Pt found to have infection and underwent surgical procedure to debride area, remove the tibial sesamoid, and release the 1st toe extensor. Pt presents today on POD1.   Clinical Impression  Pt is received semirecumbent in bed upon entry, awake, alert, and willing to participate. No acute distress noted. Pt is A&Ox3 and pleasant. Pt reports zero falls in the last 6 months. Pt strength as screened by functional mobility assessment is minimally weak in BLE. Pt falls risk is high as evidenced by slow gait speed and altered weightbearing perpetuated by newly imporsed weight bearing status. Pt remaining on room air throughout evaluation, but desaturating with activity (91%) as well as ambulation (95%), whereas pt is not on O2 at baseline at home. Patient presenting with impairment of strength, balance, oxygen perfusion, and activity tolerance, limiting ability to perform ADL and mobility tasks at  baseline level of function. Pt has demonstrated ability to tolerate household distance ambulation appropriate for DC to home with continued skilled services from [place]. Patient will benefit from skilled intervention to address the above impairments and limitations, in order to restore to prior level of function, improve patient safety upon discharge, and to decrease falls risk.       Follow Up Recommendations Home health PT    Equipment Recommendations  None recommended by PT    Recommendations for Other Services       Precautions / Restrictions Precautions Precautions: Other (comment) Precaution Comments: Per verbal from Dr. Elvina Mattes: pt is WBAT for heel weight bearing only in ortho wedge shoe on L side.  Required Braces  or Orthoses:  (Ortho wedge shoe. ) Restrictions Weight Bearing Restrictions: Yes LLE Weight Bearing: Weight bearing as tolerated (Per verbal from Dr. Elvina Mattes: pt is WBAT for heel weight bearing only in ortho wedge shoe on L side. )      Mobility  Bed Mobility Overal bed mobility: Independent                Transfers Overall transfer level: Independent Equipment used: Standard walker             General transfer comment: demonstrates good safety awareness and use of DME.   Ambulation/Gait Ambulation/Gait assistance: Supervision Ambulation Distance (Feet): 60 Feet Assistive device: Standard walker Gait Pattern/deviations: WFL(Within Functional Limits);Step-through pattern   Gait velocity interpretation: <1.8 ft/sec, indicative of risk for recurrent falls General Gait Details: Demonstrates safe gait parameters, getting used to posterior weight bearing boot on L. no LOB noted.   Stairs            Wheelchair Mobility    Modified Rankin (Stroke Patients Only)       Balance Overall balance assessment: No apparent balance deficits (not formally assessed);Modified Independent                                           Pertinent Vitals/Pain Pain Assessment: No/denies pain    Home Living Family/patient expects to be discharged to:: Private residence Living Arrangements: Alone Available Help at Discharge: Family (2 sons live in [place], assist with groceries 1x/week. ) Type of Home: House  Home Access: Level entry     Home Layout: One level Home Equipment: [place] - 2 wheels;Cane - single point      Prior Function Level of Independence: Independent with assistive device(s)         Comments: Still drives and able to AMB community distances with SPC, but prefers not to and often avoids doing so when possible.      Hand Dominance        Extremity/Trunk Assessment   Upper Extremity Assessment: Overall WFL for tasks assessed            Lower Extremity Assessment: Overall WFL for tasks assessed      Cervical / Trunk Assessment: Kyphotic (mild kyphosis. )  Communication   Communication: No difficulties  Cognition Arousal/Alertness: Awake/alert Behavior During Therapy: WFL for tasks assessed/performed Overall Cognitive Status: Within Functional Limits for tasks assessed                      General Comments      Exercises General Exercises - Lower Extremity Ankle Circles/Pumps: Strengthening;Left;15 reps;Seated;AROM Long [employer]: AROM;Strengthening;Left;15 reps;Seated Hip Flexion/Marching: AROM;Strengthening;Left;15 reps;Seated      Assessment/Plan    PT Assessment Patient needs continued PT services  PT Diagnosis Difficulty walking;Abnormality of gait   PT Problem List Decreased balance;Decreased activity tolerance  PT Treatment Interventions DME instruction;Gait training;Functional mobility training;Therapeutic activities;Therapeutic exercise;Balance training   PT Goals (Current goals can be found in the Care Plan section) Acute Rehab PT Goals Patient Stated Goal: DC to home, or potentially to Son's home as needed.  PT Goal Formulation: With patient Time For Goal Achievement: 10/26/14 Potential to Achieve Goals: Good    Frequency 7X/week   Barriers to discharge        Co-evaluation               End of Session Equipment Utilized During Treatment: Other (comment) (Ortho wedge boot on L. Pt's shoe on R.) Activity Tolerance: Patient tolerated treatment well;No increased pain Patient left: in bed;with call bell/phone within reach;with bed alarm set Nurse Communication: Mobility status;Other (comment);Precautions;Weight bearing status         Time: 8887-5797 PT Time Calculation (min) (ACUTE ONLY): 32 min   Charges:   PT Evaluation $Initial PT Evaluation Tier I: 1 Procedure PT Treatments $Therapeutic Exercise: 8-22 mins   PT G Codes:        Buccola,Allan  C 10/12/2014, 12:22 PM  12:26 PM  Etta Grandchild, PT, DPT [place] License # [id number]

## 2014-10-12 NOTE — Consult Note (Signed)
Patient Demographics  Sharon [name], is a 81 y.o. female   MRN: [id number]   DOB - 09/02/1933  Admit Date - 10/10/2014    Outpatient Primary MD for the patient is Rusty Aus., MD  Consult requested in the Hospital by Rusty Aus, MD, On 10/12/2014      With History of -  Past Medical History  Diagnosis Date  . CHF (congestive heart failure)   . Diabetes mellitus without complication   . Hyperlipidemia   . Atrial fibrillation   . Hypothyroid   . Hypertension   . Stroke   . Neuropathy   . Anemia   . Edema     feet/ankles occas      Past Surgical History  Procedure Laterality Date  . Hemiarthroplasty hip Right   . Hemiarthroplasty hip Left   . Cholecystectomy    . Appendectomy    . Cardiac catheterization  08/25/13  . Coronary angioplasty    . Cataract extraction w/phaco Left 07/20/2014    Procedure: CATARACT EXTRACTION PHACO AND INTRAOCULAR LENS PLACEMENT (IOC);  Surgeon: Leandrew Koyanagi, MD;  Location: [facility] ORS;  Service: Ophthalmology;  Laterality: Left;  [place]  1:18                 AP     23.6             CDE   9.69      lot #[id number]  . Incision and drainage Left 10/11/2014    Procedure: Removal of infected tibial sessmoid;  Surgeon: Albertine Patricia, DPM;  Location: [facility] ORS;  Service: Podiatry;  Laterality: Left;    HPI  Sharon [name]  is a 81 y.o. female, Admitted for surgical debridement of infected bone to left foot.She is doing well today with minimal pain.PT worked with her today and helped her with protected ambulation using orthowedge shoe and walker.    Review of Systems    In addition to the HPI above,  No Fever-chills, No Headache, No changes with Vision or hearing, No problems swallowing food or Liquids, No Chest pain, Cough or Shortness of Breath, No Abdominal pain,  No Nausea or Vommitting, Bowel movements are regular, No Blood in stool or Urine, No dysuria, No new skin rashes or bruises, No new joints pains-aches,  No new weakness, tingling, numbness in any extremity, No recent weight gain or loss, No polyuria, polydypsia or polyphagia, No significant Mental Stressors.  A full 10 point Review of Systems was done, except as stated above, all other Review of Systems were negative.   Social History Social History  Substance Use Topics  . Smoking status: Former Smoker -- 0.50 packs/day for 7 years    Types: Cigarettes    Quit date: 05/31/1998  . Smokeless tobacco: Never Used  . Alcohol Use: No     Family History Family History  Problem Relation Age of Onset  . Heart attack Mother      Prior to Admission medications   Medication Sig Start  Date End Date Taking? Authorizing Provider  amiodarone (PACERONE) 200 MG tablet Take 200 mg by mouth daily.   Yes Historical Provider, MD  aspirin 81 MG tablet Take 81 mg by mouth daily.   Yes Historical Provider, MD  Cholecalciferol 1000 UNITS capsule Take 1,000 Units by mouth daily.   Yes Historical Provider, MD  ferrous sulfate 325 (65 FE) MG tablet Take 325 mg by mouth daily with breakfast.   Yes Historical Provider, MD  gabapentin (NEURONTIN) 300 MG capsule Take 300 mg by mouth 2 (two) times daily.   Yes Historical Provider, MD  glipiZIDE (GLUCOTROL) 10 MG tablet Take by mouth daily before breakfast.   Yes Historical Provider, MD  glyBURIDE-metformin (GLUCOVANCE) 5-500 MG per tablet Take 1 tablet by mouth daily with breakfast.   Yes Historical Provider, MD  levothyroxine (SYNTHROID, LEVOTHROID) 112 MCG tablet Take 112 mcg by mouth daily before breakfast.   Yes Historical Provider, MD  lisinopril (PRINIVIL,ZESTRIL) 5 MG tablet Take 5 mg by mouth daily.   Yes Historical Provider, MD  metoprolol tartrate (LOPRESSOR) 25 MG tablet Take 25 mg by mouth at bedtime.    Yes Historical Provider, MD  Multiple  Vitamins-Minerals (PRESERVISION AREDS) TABS Take 1 tablet by mouth 2 times daily at 12 noon and 4 pm.   Yes Historical Provider, MD  simvastatin (ZOCOR) 40 MG tablet Take 40 mg by mouth daily.   Yes Historical Provider, MD  temazepam (RESTORIL) 15 MG capsule Take 15 mg by mouth at bedtime as needed for sleep.   Yes Historical Provider, MD  docusate (COLACE) 50 MG/5ML liquid Take 100 mg by mouth 2 (two) times daily.    Historical Provider, MD  doxycycline (ORACEA) 40 MG capsule Take 40 mg by mouth 2 (two) times daily.    Historical Provider, MD  pantoprazole (PROTONIX) 20 MG tablet Take 20 mg by mouth daily.    Historical Provider, MD    Anti-infectives    Start     Dose/Rate Route Frequency Ordered Stop   10/12/14 0900  cefTRIAXone (ROCEPHIN) 2 g in dextrose 5 % 50 mL IVPB     2 g 100 mL/hr over 30 Minutes Intravenous Every 24 hours 10/12/14 0817     10/12/14 0815  cefTRIAXone (ROCEPHIN) 1 g in dextrose 5 % 50 mL IVPB  Status:  Discontinued     1 g 100 mL/hr over 30 Minutes Intravenous Every 24 hours 10/12/14 0806 10/12/14 0816   10/11/14 0830  vancomycin (VANCOCIN) IVPB 1000 mg/200 mL premix     1,000 mg 200 mL/hr over 60 Minutes Intravenous Every 24 hours 10/11/14 0811     10/11/14 0730  vancomycin (VANCOCIN) IVPB 750 mg/150 ml premix  Status:  Discontinued    Comments:  Pharmacy to dose   750 mg 150 mL/hr over 60 Minutes Intravenous Every 12 hours 10/11/14 0722 10/11/14 0811      Scheduled Meds: . amiodarone  200 mg Oral Daily  . aspirin  81 mg Oral Daily  . cefTRIAXone (ROCEPHIN)  IV  2 g Intravenous Q24H  . cholecalciferol  1,000 Units Oral Daily  . docusate sodium  100 mg Oral BID  . enoxaparin (LOVENOX) injection  40 mg Subcutaneous Q24H  . ferrous sulfate  325 mg Oral Q breakfast  . gabapentin  300 mg Oral BID  . glipiZIDE  10 mg Oral QAC breakfast  . insulin aspart  0-5 Units Subcutaneous QHS  . insulin aspart  0-9 Units Subcutaneous TID WC  . insulin  aspart  3 Units  Subcutaneous TID WC  . levothyroxine  112 mcg Oral QAC breakfast  . lisinopril  5 mg Oral Daily  . metoprolol tartrate  25 mg Oral QHS  . multivitamin-lutein  1 capsule Oral Daily  . pantoprazole  40 mg Oral Daily  . simvastatin  40 mg Oral Daily  . vancomycin  1,000 mg Intravenous Q24H   Continuous Infusions: . sodium chloride 75 mL/hr at 10/12/14 0343   PRN Meds:.acetaminophen **OR** acetaminophen, alum & mag hydroxide-simeth, fentaNYL (SUBLIMAZE) injection, HYDROmorphone (DILAUDID) injection, morphine injection, ondansetron (ZOFRAN) IV, temazepam  Allergies  Allergen Reactions  . Codeine Other (See Comments)    unknown  . Cymbalta [Duloxetine Hcl] Other (See Comments)    Confusion, disorientation    Physical Exam  Vitals  Blood pressure 146/42, pulse 65, temperature 98.3 F (36.8 C), temperature source Oral, resp. rate 17, height '5\' 5"'$  (1.651 m), weight 58.514 kg (129 lb), SpO2 100 %.  Lower Extremity exam:  Vascular:palpable pulses bilateral  Dermatological:wound shows reduction of cellulitis.  Drainage due to antibiotic beads laced with vanc and gent.   Neurological:POSTIVE BILATERAL  Ortho:Toe and left foot appear stable.  Data Review  CBC  Recent Labs Lab 10/10/14 1143 10/11/14 0553  WBC 9.5 8.2  HGB 10.2* 10.3*  HCT 32.3* 33.3*  PLT 343 350  MCV 82.9 83.7  MCH 26.2 26.0  MCHC 31.7* 31.1*  RDW 16.7* 17.0*   ------------------------------------------------------------------------------------------------------------------  Chemistries   Recent Labs Lab 10/10/14 1143 10/11/14 0553  NA 138 137  K 3.9 4.1  CL 105 104  CO2 26 25  GLUCOSE 123* 87  BUN 21* 21*  CREATININE 1.35* 1.13*  CALCIUM 8.8* 8.9   ------------------------------------------------------------------------------------------------------------------ estimated creatinine clearance is 36.3 mL/min (by C-G formula based on Cr of  1.13). ------------------------------------------------------------------------------------------------------------------ No results for input(s): TSH, T4TOTAL, T3FREE, THYROIDAB in the last 72 hours.  Invalid input(s): FREET3   Coagulation profile No results for input(s): INR, PROTIME in the last 168 hours. ------------------------------------------------------------------------------------------------------------------- No results for input(s): DDIMER in the last 72 hours. ------------------------------------------------------------------------------------------------------------------- ---------------------------------------------------------------------------------------------------------------  Urinalysis    Component Value Date/Time   COLORURINE Yellow 08/18/2013 0253   APPEARANCEUR Hazy 08/18/2013 0253   LABSPEC 1.012 08/18/2013 0253   PHURINE 5.0 08/18/2013 0253   GLUCOSEU >=500 08/18/2013 0253   HGBUR Negative 08/18/2013 0253   BILIRUBINUR Negative 08/18/2013 0253   KETONESUR Negative 08/18/2013 0253   PROTEINUR 100 mg/dL 08/18/2013 0253   NITRITE Negative 08/18/2013 0253   LEUKOCYTESUR Trace 08/18/2013 0253     Imaging results:   Mr Foot Left W Wo Contrast  10/10/2014   CLINICAL DATA:  Full-thickness ulcer involving the plantar aspect of the forefoot located at the first metatarsal head area.  EXAM: MRI OF THE LEFT FOREFOOT WITHOUT AND WITH CONTRAST  TECHNIQUE: Multiplanar, multisequence MR imaging was performed both before and after administration of intravenous contrast.  CONTRAST:  71m MULTIHANCE GADOBENATE DIMEGLUMINE 529 MG/ML IV SOLN  COMPARISON:  None.  FINDINGS: There is an open wound on the plantar aspect of the foot at the level of the first metatarsal head. MR findings consistent with septic arthritis involving the first metatarsal phalangeal joint and osteomyelitis involving the first metatarsal and first proximal phalanx. There is also osteomyelitis involving  the medial sesamoiditis of the great toe. There is diffuse cellulitis and myositis.  There is a oblique coursing fracture of the medial malleolus at and above the level of the ankle mortise. This may be a subacute fracture. The  fibula is intact. The tibiotalar joint is intact. The subtalar joints appear normal. The major ankle ligaments and tendons are intact.  IMPRESSION: Open wound on the plantar aspect of the forefoot at the level of the first metatarsal head. There is underlying septic arthritis and osteomyelitis as discussed above.  Diffuse cellulitis and myositis.  Nondisplaced fracture of the medial malleolus.   Electronically Signed   By: Marijo Sanes M.D.   On: 10/10/2014 15:35     Assessment & Plan: Pt is stable and improving at this stage.  Tibial sesamoid was definitely infected, but metatarsal and phalanx of involved joints appeared yesterday at time of surgery.  MRSA + wound culture from 9-26.  Bone culture yesterday.  Needs daily dressing changes.  Stay with BRPs only during hospital stay, and always use orthowedge shoe when standing or walking.  Current coverage good for MRSA.  Vancomycin and gentamycin beads in wound.Dr. Vickki Muff will follow tomorrow.  Active Problems:   Diabetic foot infection   Thank you for the consult, we will follow the patient with you in the Hospital.   Perry Mount M.D on 10/12/2014 at 1:02 PM

## 2014-10-12 NOTE — Care Management Important Message (Signed)
Important Message  Patient Details  Name: Sharon [name] MRN: [id number] Date of Birth: 01-Oct-1933   Medicare Important Message Given:  Yes-second notification given    Alvie Heidelberg, RN 10/12/2014, 12:05 PM

## 2014-10-12 NOTE — Clinical Social Work Note (Signed)
Clinical Social Work Assessment  Patient Details  Name: Sharon [name] MRN: [id number] Date of Birth: 17-Feb-1933  Date of referral:  10/12/14               Reason for consult:  Discharge Planning                Permission sought to share information with:  Family Supports Permission granted to share information::  Yes, Verbal Permission Granted  Name::        Agency::     Relationship::     Contact Information:     Housing/Transportation Living arrangements for the past 2 months:  Single Family Home Source of Information:  Patient Patient Interpreter Needed:  None Criminal Activity/Legal Involvement Pertinent to Current Situation/Hospitalization:  No - Comment as needed Significant Relationships:  Adult Children Lives with:  Self Do you feel safe going back to the place where you live?  Yes Need for family participation in patient care:  No (Coment)  Care giving concerns:  Patient lives alone   Facilities manager / plan:  CSW met with patient to discuss discharge planning. PT has recommended home with home health. MD put in CSW consult earlier today for possible rehab placement. CSW explained to patient that her insurance would not approve for patient to go to a skilled rehab facility if PT was recommending for home health. CSW asked if I had permission to speak with her son: Sharon [name] and patient gave me permission to do this. CSW attempted to contact Mr. Ehresman at [phone] but there was no answer and CSW left a message for him.   Employment status:  Retired Nurse, adult PT Recommendations:  Home with [place] / Referral to community resources:     Patient/Family's Response to care:  Patient expressed appreciation for CSW assistance.   Patient/Family's Understanding of and Emotional Response to Diagnosis, Current Treatment, and Prognosis:  Patient willing to do whatever is recommended as being best for her.    Emotional Assessment Appearance:  Appears stated age Attitude/Demeanor/Rapport:   (pleasant and cooperative) Affect (typically observed):  Accepting, Appropriate Orientation:  Oriented to Self, Oriented to Place, Oriented to  Time, Oriented to Situation Alcohol / Substance use:  Not Applicable Psych involvement (Current and /or in the community):  No (Comment)  Discharge Needs  Concerns to be addressed:  Care Coordination Readmission within the last 30 days:  No Current discharge risk:  None Barriers to Discharge:  No Barriers Identified   Shela Leff, LCSW 10/12/2014, 3:29 PM

## 2014-10-12 NOTE — Plan of Care (Signed)
Problem: Acute Rehab PT Goals(only PT should resolve) Goal: Pt Will Ambulate Pt will ambulate with LRAD at Supervision using a step-through pattern and equal step length for a distances greater than 266f to demonstrate the ability to perform safe household distance ambulation at discharge.

## 2014-10-13 LAB — WOUND CULTURE

## 2014-10-13 LAB — CREATININE, SERUM
Creatinine, Ser: 0.98 mg/dL (ref 0.44–1.00)
GFR, EST NON AFRICAN AMERICAN: 53 mL/min — AB (ref >60)

## 2014-10-13 LAB — GLUCOSE, CAPILLARY
GLUCOSE-CAPILLARY: 211 mg/dL — AB (ref 65–99)
GLUCOSE-CAPILLARY: 70 mg/dL (ref 65–99)
Glucose-Capillary: 80 mg/dL (ref 65–99)
Glucose-Capillary: 140 mg/dL — ABNORMAL HIGH (ref 65–99)

## 2014-10-13 LAB — SURGICAL PATHOLOGY

## 2014-10-13 NOTE — Care Management (Signed)
Spoke with Barbee Shropshire at physical therapy, She will re-evaluate patient today. Patient did not ambulate with PT yesterday.

## 2014-10-13 NOTE — Progress Notes (Signed)
Patient ID: Sharon [name], female   DOB: [birth year], 81 y.o.   MRN: [id number] Patient ID: Sharon [name], female   DOB: [birth year], 81 y.o.   MRN: [id number] Sharon [name] is a 81 y.o. female   SUBJECTIVE:  Patient admitted with cellulitis of the left lower leg with osteomyelitis of the ankle, no complaints this morning. Postop, mild pain  ______________________________________________________________________  ROS: Review of systems is unremarkable for any active cardiac,respiratory, GI, GU, hematologic, neurologic or psychiatric systems, 10 systems reviewed.  Marland Kitchen amiodarone  200 mg Oral Daily  . aspirin  81 mg Oral Daily  . cefTRIAXone (ROCEPHIN)  IV  2 g Intravenous Q24H  . cholecalciferol  1,000 Units Oral Daily  . docusate sodium  100 mg Oral BID  . enoxaparin (LOVENOX) injection  40 mg Subcutaneous Q24H  . ferrous sulfate  325 mg Oral Q breakfast  . gabapentin  300 mg Oral BID  . glipiZIDE  10 mg Oral QAC breakfast  . insulin aspart  0-5 Units Subcutaneous QHS  . insulin aspart  0-9 Units Subcutaneous TID WC  . insulin aspart  3 Units Subcutaneous TID WC  . levothyroxine  112 mcg Oral QAC breakfast  . lisinopril  5 mg Oral Daily  . metoprolol tartrate  25 mg Oral QHS  . multivitamin-lutein  1 capsule Oral Daily  . pantoprazole  40 mg Oral Daily  . simvastatin  40 mg Oral Daily  . vancomycin  1,000 mg Intravenous Q24H   acetaminophen **OR** acetaminophen, alum & mag hydroxide-simeth, fentaNYL (SUBLIMAZE) injection, HYDROmorphone (DILAUDID) injection, morphine injection, ondansetron (ZOFRAN) IV, temazepam   Past Medical History  Diagnosis Date  . CHF (congestive heart failure)   . Diabetes mellitus without complication   . Hyperlipidemia   . Atrial fibrillation   . Hypothyroid   . Hypertension   . Stroke   . Neuropathy   . Anemia   . Edema     feet/ankles occas    Past Surgical History  Procedure Laterality Date  . Hemiarthroplasty hip Right   .  Hemiarthroplasty hip Left   . Cholecystectomy    . Appendectomy    . Cardiac catheterization  08/25/13  . Coronary angioplasty    . Cataract extraction w/phaco Left 07/20/2014    Procedure: CATARACT EXTRACTION PHACO AND INTRAOCULAR LENS PLACEMENT (IOC);  Surgeon: Leandrew Koyanagi, MD;  Location: [facility] ORS;  Service: Ophthalmology;  Laterality: Left;  [place]  1:18                 AP     23.6             CDE   9.69      lot #[id number]  . Incision and drainage Left 10/11/2014    Procedure: Removal of infected tibial sessmoid;  Surgeon: Albertine Patricia, DPM;  Location: [facility] ORS;  Service: Podiatry;  Laterality: Left;    PHYSICAL EXAM:  BP 154/63 mmHg  Pulse 72  Temp(Src) 97.9 F (36.6 C) (Oral)  Resp 20  Ht '5\' 5"'$  (1.651 m)  Wt 58.514 kg (129 lb)  BMI 21.47 kg/m2  SpO2 97%  Wt Readings from Last 3 Encounters:  10/10/14 58.514 kg (129 lb)  10/10/14 58.514 kg (129 lb)  08/22/14 61.236 kg (135 lb)           BP Readings from Last 3 Encounters:  10/12/14 154/63  08/22/14 154/57  07/20/14 147/37    Constitutional: NAD Neck: supple, no thyromegaly Respiratory: CTA, no rales or  wheezes Cardiovascular: IRR, no murmur, no gallop Abdomen: soft, good BS, nontender Extremities: no edema, skin erythema resolved, ulcer left ankle Neuro: alert , not oriented, no focal motor or sensory deficits  ASSESSMENT/PLAN:  Labs and imaging studies were reviewed  Ankle osteomyelitis/cellulitis-IV antibiotics, post surgical debridement, surgical care, MRSA, IV vancomycin Severe peripheral neuropathy-patient unable to feel her feet, gabapentin Diabetes mellitus-sliding scale with OHA, stable A. fib-rate controlled, continue medications History GI bleed-continue iron, follow hemoglobin Dementia-mild, chronic  home Monday or Tuesday, podiatry disposition

## 2014-10-13 NOTE — Progress Notes (Signed)
ANTIBIOTIC CONSULT NOTE - FOLLOW UP  Pharmacy Consult for vancomycin dosing/monitoring Indication: Osteomyelitis  Allergies  Allergen Reactions  . Codeine Other (See Comments)    unknown  . Cymbalta [Duloxetine Hcl] Other (See Comments)    Confusion, disorientation    Patient Measurements: Height: '5\' 5"'$  (165.1 cm) Weight: 129 lb (58.514 kg) IBW/kg (Calculated) : 57  Vital Signs:   Intake/Output from previous day: 09/29 0701 - 09/30 0700 In: 2100 [P.O.:480; I.V.:1420; IV Piggyback:200] Out: 2950 [Urine:2950] Intake/Output from this shift: Total I/O In: 293.8 [I.V.:293.8] Out: -  Labs:  Recent Labs  10/11/14 0553 10/13/14 0556  WBC 8.2  --   HGB 10.3*  --   PLT 350  --   CREATININE 1.13* 0.98   Estimated Creatinine Clearance: 41.9 mL/min (by C-G formula based on Cr of 0.98). No results for input(s): VANCOTROUGH, VANCOPEAK, VANCORANDOM, GENTTROUGH, GENTPEAK, GENTRANDOM, TOBRATROUGH, TOBRAPEAK, TOBRARND, AMIKACINPEAK, AMIKACINTROU, AMIKACIN in the last 72 hours.    Anti-infectives    Start     Dose/Rate Route Frequency Ordered Stop   10/12/14 0900  cefTRIAXone (ROCEPHIN) 2 g in dextrose 5 % 50 mL IVPB  Status:  Discontinued     2 g 100 mL/hr over 30 Minutes Intravenous Every 24 hours 10/12/14 0817 10/13/14 0749   10/12/14 0815  cefTRIAXone (ROCEPHIN) 1 g in dextrose 5 % 50 mL IVPB  Status:  Discontinued     1 g 100 mL/hr over 30 Minutes Intravenous Every 24 hours 10/12/14 0806 10/12/14 0816   10/11/14 0830  vancomycin (VANCOCIN) IVPB 1000 mg/200 mL premix     1,000 mg 200 mL/hr over 60 Minutes Intravenous Every 24 hours 10/11/14 0811     10/11/14 0730  vancomycin (VANCOCIN) IVPB 750 mg/150 ml premix  Status:  Discontinued    Comments:  Pharmacy to dose   750 mg 150 mL/hr over 60 Minutes Intravenous Every 12 hours 10/11/14 0722 10/11/14 0350      Assessment: Pharmacy was consulted to dose vancomycin on this 81 year old female who presented on 9/28 will  cellulitis/osteomyelitis of left lower extremity. Underwent surgical debridement on 9/28.  CrCl ~ 40 mL/min  Patient was initiated on vancomycin 1g IV q24h  Goal of Therapy:  Vancomycin trough level 15-20 mcg/ml  Plan:  Vancomycin trough ordered for tomorrow morning (10/1) prior to 5th dose. Pharmacy to follow up with vancomycin trough and adjust dose if necessary.  Darylene Price Swayne 10/13/2014,1:48 PM

## 2014-10-13 NOTE — Clinical Social Work Note (Signed)
PT has assessed again today and is recommending home with home health. RN CM to contact patient's son to update. Shela Leff MSW,LcSW [phone]

## 2014-10-13 NOTE — Progress Notes (Signed)
Physical Therapy Treatment Patient Details Name: Sharon [name] MRN: [id number] DOB: July 11, 1933 Today's Date: 10/13/2014    History of Present Illness Pt is a 81yo white female with PMH DM, who presented to [facility] with full thickness wound on plantar surface of the L foot near the 1st MTP. Pt found to have infection and underwent surgical procedure to debride area, remove the tibial sesamoid, and release the 1st toe extensor.      PT Comments    Pt able to donn L ortho wedge shoe and R post-op shoe modified independently (in sitting).  Pt appearing mildly unsteady lifting SW to advance ambulation so switched to RW and after initial cueing for safety and technique, pt did well with maintaining precautions and appeared steady and safe.  Pt ambulated 140 feet with RW and appeared to tolerate it well without any c/o pain.   Follow Up Recommendations  Home health PT     Equipment Recommendations  None recommended by PT    Recommendations for Other Services       Precautions / Restrictions Precautions Precautions: Fall Precaution Comments: Per verbal from Dr. Elvina Mattes: pt is WBAT for heel weight bearing only in ortho wedge shoe on L side.  Required Braces or Orthoses: Other Brace/Splint (Ortho wedge shoe) Restrictions Weight Bearing Restrictions: Yes LLE Weight Bearing: Weight bearing as tolerated (with ortho wedge shoe)    Mobility  Bed Mobility Overal bed mobility: Independent                Transfers Overall transfer level: Independent Equipment used: Rolling walker (2 wheeled)             General transfer comment: steady without loss of balance  Ambulation/Gait Ambulation/Gait assistance: Min guard Ambulation Distance (Feet): 140 Feet Assistive device: Rolling walker (2 wheeled) Gait Pattern/deviations: Step-to pattern Gait velocity: decreased   General Gait Details: switched SW to RW d/t pt mildly unsteady having to lift walker each step; pt initially  requiring vc's for walker technique, shorter step length, and posterior WB'ing on L (with ortho wedge boot) but pt did well with cueing and then only requiring occasional reminders   Stairs            Wheelchair Mobility    Modified Rankin (Stroke Patients Only)       Balance Overall balance assessment: Needs assistance Sitting-balance support: No upper extremity supported;Feet supported Sitting balance-Leahy Scale: Normal     Standing balance support: Bilateral upper extremity supported (RW) Standing balance-Leahy Scale: Good                      Cognition Arousal/Alertness: Awake/alert Behavior During Therapy: WFL for tasks assessed/performed Overall Cognitive Status: Within Functional Limits for tasks assessed                      Exercises      General Comments General comments (skin integrity, edema, etc.): dressing recently changed and intact  Pt agreeable to PT session and reports she has been walking to bathroom with nursing assist.     Pertinent Vitals/Pain Pain Assessment: No/denies pain  Vitals stable and WFL throughout treatment session.    Home Living                      Prior Function            PT Goals (current goals can now be found in the care plan section) Acute Rehab  PT Goals Patient Stated Goal: D/C home PT Goal Formulation: With patient Time For Goal Achievement: 10/26/14 Potential to Achieve Goals: Good Progress towards PT goals: Progressing toward goals    Frequency  7X/week    PT Plan Current plan remains appropriate    Co-evaluation             End of Session Equipment Utilized During Treatment: Gait belt;Other (comment) (L ortho wedge shoe and R post-op shoe) Activity Tolerance: Patient tolerated treatment well;No increased pain Patient left:  (sitting on edge of bed with NA present (NA was going to take pt to bathroom))     Time: 1131-1154 PT Time Calculation (min) (ACUTE ONLY): 23  min  Charges:  $Gait Training: 8-22 mins $Therapeutic Activity: 8-22 mins                    G CodesLeitha Bleak 2014-11-06, 12:29 PM Leitha Bleak, [place]

## 2014-10-13 NOTE — Clinical Social Work Note (Signed)
Patient's son called CSW this morning and CSW updated him regarding PT's recommendation from yesterday and that we were having PT come back to work with patient today. CSW informed Mr. Ackroyd that if the recommendation remains for home health that the RN CM would contact him and that if it was changed to STR, that I would contact him again to discuss rehab. Patient's son verbalized understanding. Shela Leff MSW,LCSW [phone]

## 2014-10-13 NOTE — Care Management (Signed)
Spoke with patient who is alert and oriented. States is from home alone and independent. Has walker and cane and home is on one level with no stairs.  Stated that her two adult sons visit regularly. Patient states she has no co pays on mediations. Offered choice of home health agencies. Patient stated that she had had [facility] prior but she would like to go with [facility] due to their affiliation with [facility]. referral placed with Floydene Flock at [facility].  Patient will need PT and RN for wound care. Anticipated discharge on Monday or Tuesday per MD notes. Contacted the patients son Shaniqua Guillot [phone] to discuss discharge plan. MR Coghill was agreeable to discharge plan but would like to speak with MD to discuss patients prognosis.

## 2014-10-13 NOTE — Progress Notes (Signed)
Daily Progress Note   Subjective  - 2 Days Post-Op  Follow-up left foot debridement.  She is doing well.  No pain today.  Objective Filed Vitals:   10/12/14 1259 10/12/14 1300 10/12/14 2043 10/12/14 2150  BP: 152/34 146/42 188/56 154/63  Pulse: 63 65 72   Temp: 98.3 F (36.8 C)  97.9 F (36.6 C)   TempSrc: Oral  Oral   Resp: 17  20   Height:      Weight:      SpO2: 100% 100% 97%     Physical Exam:   The incision site is looking good today.  There is no erythema surrounding the wound.  No purulent drainage.  Central aspect of the wound is packed open with vancomycin impregnated beads.  Laboratory CBC    Component Value Date/Time   WBC 8.2 10/11/2014 0553   WBC 6.2 12/28/2013 0500   HGB 10.3* 10/11/2014 0553   HGB 10.1* 12/28/2013 0500   HCT 33.3* 10/11/2014 0553   HCT 31.6* 12/28/2013 0500   PLT 350 10/11/2014 0553   PLT 234 12/28/2013 0500    BMET    Component Value Date/Time   NA 137 10/11/2014 0553   NA 132* 04/13/2014 1416   K 4.1 10/11/2014 0553   K 3.8 04/13/2014 1416   CL 104 10/11/2014 0553   CL 100* 04/13/2014 1416   CO2 25 10/11/2014 0553   CO2 24 04/13/2014 1416   GLUCOSE 87 10/11/2014 0553   GLUCOSE 123* 04/13/2014 1416   BUN 21* 10/11/2014 0553   BUN 15 04/13/2014 1416   CREATININE 0.98 10/13/2014 0556   CREATININE 1.04* 04/13/2014 1416   CALCIUM 8.9 10/11/2014 0553   CALCIUM 8.7* 04/13/2014 1416   GFRNONAA 53* 10/13/2014 0556   GFRNONAA 51* 04/13/2014 1416   GFRNONAA 41* 12/28/2013 0500   GFRAA >60 10/13/2014 0556   GFRAA 60* 04/13/2014 1416   GFRAA 50* 12/28/2013 0500    Assessment/Planning:   Osteomyelitis sesamoids left great toe status post excision of sesamoids    dressing was changed today.  Would recommend   Every3 day dressing changes with dry dressing.  Continue to weight bear only on the heel with transfer for now.  Should tried a remain nonweightbearing as possible.  Will re-evaluate tomorrow.    Should be stable for  discharge by Monday from Podiatry standpoint.    Sharon [name] A  10/13/2014, 11:32 AM

## 2014-10-14 LAB — CBC WITH DIFFERENTIAL/PLATELET
BASOS ABS: 0.2 10*3/uL — AB (ref 0–0.1)
Basophils Relative: 3 %
EOS ABS: 0.3 10*3/uL (ref 0–0.7)
EOS PCT: 6 %
HCT: 28.8 % — ABNORMAL LOW (ref 35.0–47.0)
Hemoglobin: 9 g/dL — ABNORMAL LOW (ref 12.0–16.0)
LYMPHS ABS: 1.1 10*3/uL (ref 1.0–3.6)
Lymphocytes Relative: 20 %
MCH: 25.8 pg — AB (ref 26.0–34.0)
MCHC: 31.4 g/dL — ABNORMAL LOW (ref 32.0–36.0)
MCV: 82.2 fL (ref 80.0–100.0)
Monocytes Absolute: 0.4 10*3/uL (ref 0.2–0.9)
Monocytes Relative: 8 %
Neutro Abs: 3.4 10*3/uL (ref 1.4–6.5)
Neutrophils Relative %: 63 %
Platelets: 327 10*3/uL (ref 150–440)
RBC: 3.5 MIL/uL — AB (ref 3.80–5.20)
RDW: 16.9 % — ABNORMAL HIGH (ref 11.5–14.5)
WBC: 5.4 10*3/uL (ref 3.6–11.0)

## 2014-10-14 LAB — GLUCOSE, CAPILLARY
Glucose-Capillary: 81 mg/dL (ref 65–99)
Glucose-Capillary: 252 mg/dL — ABNORMAL HIGH (ref 65–99)
Glucose-Capillary: 96 mg/dL (ref 65–99)

## 2014-10-14 LAB — VANCOMYCIN, TROUGH: VANCOMYCIN TR: 13 ug/mL (ref 10–20)

## 2014-10-14 LAB — CREATININE, SERUM
CREATININE: 1.06 mg/dL — AB (ref 0.44–1.00)
GFR calc Af Amer: 56 mL/min — ABNORMAL LOW (ref >60)
GFR, EST NON AFRICAN AMERICAN: 49 mL/min — AB (ref >60)

## 2014-10-14 MED ORDER — VANCOMYCIN HCL IN DEXTROSE 1-5 GM/200ML-% IV SOLN
1000.0000 mg | INTRAVENOUS | Status: DC
  Administered 2014-10-15 – 2014-10-16 (×3): 1000 mg via INTRAVENOUS
  Filled 2014-10-14 (×4): qty 200

## 2014-10-14 MED ORDER — VANCOMYCIN HCL IN DEXTROSE 1-5 GM/200ML-% IV SOLN
1000.0000 mg | INTRAVENOUS | Status: DC
  Filled 2014-10-14: qty 200

## 2014-10-14 MED ORDER — VANCOMYCIN HCL IN DEXTROSE 1-5 GM/200ML-% IV SOLN
1000.0000 mg | Freq: Once | INTRAVENOUS | Status: AC
  Administered 2014-10-14: 1000 mg via INTRAVENOUS
  Filled 2014-10-14: qty 200

## 2014-10-14 NOTE — Progress Notes (Signed)
ANTIBIOTIC CONSULT NOTE - FOLLOW UP  Pharmacy Consult for vancomycin dosing/monitoring Indication: Osteomyelitis  Allergies  Allergen Reactions  . Codeine Other (See Comments)    unknown  . Cymbalta [Duloxetine Hcl] Other (See Comments)    Confusion, disorientation    Patient Measurements: Height: '5\' 5"'$  (165.1 cm) Weight: 129 lb (58.514 kg) IBW/kg (Calculated) : 57  Vital Signs: BP: 164/46 mmHg (10/01 0446) Pulse Rate: 60 (10/01 0446) Intake/Output from previous day: 09/30 0701 - 10/01 0700 In: 947.5 [I.V.:947.5] Out: 900 [Urine:900] Intake/Output from this shift: Total I/O In: 441 [P.O.:240; I.V.:201] Out: 0  Labs:  Recent Labs  10/13/14 0556 10/14/14 0946  WBC  --  5.4  HGB  --  9.0*  PLT  --  327  CREATININE 0.98 1.06*   Estimated Creatinine Clearance: 38.7 mL/min (by C-G formula based on Cr of 1.06).  Recent Labs  10/14/14 0945  VANCOTROUGH 13      Anti-infectives    Start     Dose/Rate Route Frequency Ordered Stop   10/15/14 0230  vancomycin (VANCOCIN) IVPB 1000 mg/200 mL premix     1,000 mg 200 mL/hr over 60 Minutes Intravenous Every 18 hours 10/14/14 1026     10/12/14 0900  cefTRIAXone (ROCEPHIN) 2 g in dextrose 5 % 50 mL IVPB  Status:  Discontinued     2 g 100 mL/hr over 30 Minutes Intravenous Every 24 hours 10/12/14 0817 10/13/14 0749   10/12/14 0815  cefTRIAXone (ROCEPHIN) 1 g in dextrose 5 % 50 mL IVPB  Status:  Discontinued     1 g 100 mL/hr over 30 Minutes Intravenous Every 24 hours 10/12/14 0806 10/12/14 0816   10/11/14 0830  vancomycin (VANCOCIN) IVPB 1000 mg/200 mL premix  Status:  Discontinued     1,000 mg 200 mL/hr over 60 Minutes Intravenous Every 24 hours 10/11/14 0811 10/14/14 1026   10/11/14 0730  vancomycin (VANCOCIN) IVPB 750 mg/150 ml premix  Status:  Discontinued    Comments:  Pharmacy to dose   750 mg 150 mL/hr over 60 Minutes Intravenous Every 12 hours 10/11/14 0722 10/11/14 8588      Assessment: Pharmacy was consulted  to dose vancomycin on this 81 year old female who presented on 9/28 will cellulitis/osteomyelitis of left lower extremity. Underwent surgical debridement on 9/28.  CrCl ~ 40 mL/min  Patient was initiated on vancomycin 1g IV q24h  Goal of Therapy:  Vancomycin trough level 15-20 mcg/ml  Plan:  Will transition patient to vancomycin '1000mg'$  IV Q18hr for goal trough of 15-20. Will plan on obtaining follow-up trough prior to 5th dose of new regimen. Will following serum creatinine closely.   Pharmacy will continue to monitor and adjust per consult.   Simpson,Michael L 10/14/2014,10:26 AM

## 2014-10-14 NOTE — Care Management Note (Signed)
Case Management Note  Patient Details  Name: Sharon [name] MRN: [id number] Date of Birth: 1933/06/30  Subjective/Objective:     Per Dr Sanjuan Dame note today Ms Scalzo will be discharged home with home health on Monday.                Action/Plan:   Expected Discharge Date:  10/12/14               Expected Discharge Plan:  [place]  In-House Referral:     Discharge planning Services  CM Consult  Post Acute Care Choice:  NA Choice offered to:  Patient  DME Arranged:  N/A DME Agency:  NA  HH Arranged:  RN, PT [place] Agency:  [facility]  Status of Service:  In process, will continue to follow  Medicare Important Message Given:  Yes-second notification given Date Medicare IM Given:    Medicare IM give by:    Date Additional Medicare IM Given:    Additional Medicare Important Message give by:     If discussed at [place] of Stay Meetings, dates discussed:    Additional Comments:  Zade Falkner A, RN 10/14/2014, 11:34 AM

## 2014-10-14 NOTE — Progress Notes (Signed)
Physical Therapy Treatment Patient Details Name: Sharon [name] MRN: [id number] DOB: 31-Mar-1933 Today's Date: 10/14/2014    History of Present Illness Pt is a 81yo white female with PMH DM, who presented to [facility] with full thickness wound on plantar surface of the L foot near the 1st MTP. Pt found to have infection and underwent surgical procedure to debride area, remove the tibial sesamoid, and release the 1st toe extensor.      PT Comments     Pt tolerating treatment session well, motivated and able to complete entire PT sesssion as planned. Pt continues to make progress toward goals as evidenced by progression of gait distance and quality, without the need for verbal cues. Pt's greatest limitation continues to be new balance impairments that are residual of newly imposed weight bearing restrictions and ortho wedge boot, which continues to limit ability to perform household mobility at baseline function. Patient presenting with impairment of strength, pain, range of motion, balance, and activity tolerance, limiting ability to perform ADL and mobility tasks at  baseline level of function. Patient will benefit from skilled intervention to address the above impairments and limitations, in order to restore to prior level of function, improve patient safety upon discharge, and to decrease caregiver burden.     Follow Up Recommendations  Home health PT     Equipment Recommendations  None recommended by PT    Recommendations for Other Services       Precautions / Restrictions Precautions Precautions: Fall Precaution Comments: Per verbal from Dr. Elvina Mattes: pt is WBAT for heel weight bearing only in ortho wedge shoe on L side.  Required Braces or Orthoses: Other Brace/Splint Restrictions Weight Bearing Restrictions: Yes LLE Weight Bearing: Weight bearing as tolerated    Mobility  Bed Mobility               General bed mobility comments: Received in chair.   Transfers Overall  transfer level: Modified independent Equipment used: Rolling walker (2 wheeled)             General transfer comment: Good safety awareness and strength.   Ambulation/Gait Ambulation/Gait assistance: Min guard Ambulation Distance (Feet): 350 Feet Assistive device: Rolling walker (2 wheeled)   Gait velocity: decreased Gait velocity interpretation: <1.8 ft/sec, indicative of risk for recurrent falls General Gait Details: Good step through bilat pattern adnd gait speed; demonstrating excellent heel weightbearing in ortho wedge on LLE.    Stairs            Wheelchair Mobility    Modified Rankin (Stroke Patients Only)       Balance Overall balance assessment: No apparent balance deficits (not formally assessed)                                  Cognition Arousal/Alertness: Awake/alert Behavior During Therapy: WFL for tasks assessed/performed Overall Cognitive Status: Within Functional Limits for tasks assessed       Memory: Decreased short-term memory (Come confusion abotu where lunch was, 6 minutes after having seen it come in the room and discussing it. )              Exercises      General Comments        Pertinent Vitals/Pain Pain Assessment: No/denies pain    Home Living                      Prior Function  PT Goals (current goals can now be found in the care plan section) Acute Rehab PT Goals Patient Stated Goal: D/C home PT Goal Formulation: With patient Time For Goal Achievement: 10/26/14 Potential to Achieve Goals: Good Progress towards PT goals: Progressing toward goals    Frequency  7X/week    PT Plan Current plan remains appropriate    Co-evaluation             End of Session Equipment Utilized During Treatment: Gait belt;Other (comment) Activity Tolerance: Patient tolerated treatment well;No increased pain Patient left: in chair;with call bell/phone within reach;with chair alarm set      Time: 1142-1155 PT Time Calculation (min) (ACUTE ONLY): 13 min  Charges:  $Therapeutic Activity: 8-22 mins                    G Codes:      Buccola,Allan C 10-24-2014, 1:18 PM 1:20 PM  Etta Grandchild, PT, DPT [place] License # [id number]

## 2014-10-14 NOTE — Progress Notes (Signed)
Assisted pt to bathroom with roller walker. Pt wears B/L ortho shoes while ambulating. IVF's infusing without difficulty. Pt with c/o pain rated 8/10, prn Dilaudid (see mar).  Pt noted with relief, asleep. Phone placed in reach and bed exit alarm placed.

## 2014-10-14 NOTE — Progress Notes (Signed)
Patient ID: Sharon [name], female   DOB: Dec 08, 1933, 81 y.o.   MRN: [id number] Patient ID: Sharon [name], female   DOB: [birth year], 81 y.o.   MRN: [id number] Patient ID: Sharon [name], female   DOB: 07/27/1933, 81 y.o.   MRN: [id number] Sharon [name] is a 81 y.o. female   SUBJECTIVE:  Patient admitted with cellulitis of the left lower leg with osteomyelitis of the ankle, no complaints this morning. Postop, mild pain  ______________________________________________________________________  ROS: Review of systems is unremarkable for any active cardiac,respiratory, GI, GU, hematologic, neurologic or psychiatric systems, 10 systems reviewed.  Marland Kitchen amiodarone  200 mg Oral Daily  . aspirin  81 mg Oral Daily  . cholecalciferol  1,000 Units Oral Daily  . docusate sodium  100 mg Oral BID  . enoxaparin (LOVENOX) injection  40 mg Subcutaneous Q24H  . ferrous sulfate  325 mg Oral Q breakfast  . gabapentin  300 mg Oral BID  . glipiZIDE  10 mg Oral QAC breakfast  . insulin aspart  0-5 Units Subcutaneous QHS  . insulin aspart  0-9 Units Subcutaneous TID WC  . insulin aspart  3 Units Subcutaneous TID WC  . levothyroxine  112 mcg Oral QAC breakfast  . lisinopril  5 mg Oral Daily  . metoprolol tartrate  25 mg Oral QHS  . multivitamin-lutein  1 capsule Oral Daily  . pantoprazole  40 mg Oral Daily  . simvastatin  40 mg Oral Daily  . vancomycin  1,000 mg Intravenous Q24H   acetaminophen **OR** acetaminophen, alum & mag hydroxide-simeth, fentaNYL (SUBLIMAZE) injection, HYDROmorphone (DILAUDID) injection, morphine injection, ondansetron (ZOFRAN) IV, temazepam   Past Medical History  Diagnosis Date  . CHF (congestive heart failure)   . Diabetes mellitus without complication   . Hyperlipidemia   . Atrial fibrillation   . Hypothyroid   . Hypertension   . Stroke   . Neuropathy   . Anemia   . Edema     feet/ankles occas    Past Surgical History  Procedure Laterality Date  . Hemiarthroplasty  hip Right   . Hemiarthroplasty hip Left   . Cholecystectomy    . Appendectomy    . Cardiac catheterization  08/25/13  . Coronary angioplasty    . Cataract extraction w/phaco Left 07/20/2014    Procedure: CATARACT EXTRACTION PHACO AND INTRAOCULAR LENS PLACEMENT (IOC);  Surgeon: Leandrew Koyanagi, MD;  Location: [facility] ORS;  Service: Ophthalmology;  Laterality: Left;  [place]  1:18                 AP     23.6             CDE   9.69      lot #[id number]  . Incision and drainage Left 10/11/2014    Procedure: Removal of infected tibial sessmoid;  Surgeon: Albertine Patricia, DPM;  Location: [facility] ORS;  Service: Podiatry;  Laterality: Left;    PHYSICAL EXAM:  BP 164/46 mmHg  Pulse 60  Temp(Src) 97.8 F (36.6 C) (Oral)  Resp 8  Ht '5\' 5"'$  (1.651 m)  Wt 58.514 kg (129 lb)  BMI 21.47 kg/m2  SpO2 97%  Wt Readings from Last 3 Encounters:  10/10/14 58.514 kg (129 lb)  10/10/14 58.514 kg (129 lb)  08/22/14 61.236 kg (135 lb)           BP Readings from Last 3 Encounters:  10/14/14 164/46  08/22/14 154/57  07/20/14 147/37    Constitutional: NAD Neck: supple, no thyromegaly  Respiratory: CTA, no rales or wheezes Cardiovascular: IRR, no murmur, no gallop Abdomen: soft, good BS, nontender Extremities: no edema, skin erythema resolved, ulcer left ankle Neuro: alert , not oriented, no focal motor or sensory deficits  ASSESSMENT/PLAN:  Labs and imaging studies were reviewed  Ankle osteomyelitis/cellulitis-IV antibiotics, post surgical debridement, surgical care, MRSA, IV vancomycin Severe peripheral neuropathy-patient unable to feel her feet, gabapentin Diabetes mellitus-sliding scale with OHA, stable A. fib-rate controlled, continue medications History GI bleed-continue iron, follow hemoglobin Dementia-mild, chronic  Home Monday with home health

## 2014-10-14 NOTE — Progress Notes (Signed)
F.u right great toe joint ulcer.  S/p surgery. Resting comfortably. Dressing changed yesterday without breakthrough bleeding.   Good CFT to digit. On Vancomycin per medicine.  Will have nursing c/w dressing changes and recommend 3 x's per week dry dressing changes upon d/c.  F/U with Dr. Elvina Mattes next week in out pt clinic.

## 2014-10-15 ENCOUNTER — Inpatient Hospital Stay: Payer: Medicare Other

## 2014-10-15 LAB — CBC WITH DIFFERENTIAL/PLATELET
BASOS PCT: 3 %
Basophils Absolute: 0.2 10*3/uL — ABNORMAL HIGH (ref 0–0.1)
EOS ABS: 0.3 10*3/uL (ref 0–0.7)
EOS PCT: 5 %
HCT: 27.1 % — ABNORMAL LOW (ref 35.0–47.0)
HEMOGLOBIN: 8.6 g/dL — AB (ref 12.0–16.0)
LYMPHS ABS: 1.8 10*3/uL (ref 1.0–3.6)
Lymphocytes Relative: 26 %
MCH: 25.7 pg — AB (ref 26.0–34.0)
MCHC: 31.9 g/dL — AB (ref 32.0–36.0)
MCV: 80.6 fL (ref 80.0–100.0)
MONO ABS: 0.6 10*3/uL (ref 0.2–0.9)
MONOS PCT: 9 %
NEUTROS PCT: 57 %
Neutro Abs: 3.9 10*3/uL (ref 1.4–6.5)
Platelets: 312 10*3/uL (ref 150–440)
RBC: 3.36 MIL/uL — ABNORMAL LOW (ref 3.80–5.20)
RDW: 16.7 % — AB (ref 11.5–14.5)
WBC: 6.8 10*3/uL (ref 3.6–11.0)

## 2014-10-15 LAB — TISSUE CULTURE

## 2014-10-15 LAB — COMPREHENSIVE METABOLIC PANEL
ALK PHOS: 222 U/L — AB (ref 38–126)
ALT: 59 U/L — ABNORMAL HIGH (ref 14–54)
ANION GAP: 8 (ref 5–15)
AST: 75 U/L — ABNORMAL HIGH (ref 15–41)
Albumin: 2.8 g/dL — ABNORMAL LOW (ref 3.5–5.0)
BUN: 17 mg/dL (ref 6–20)
CALCIUM: 8.6 mg/dL — AB (ref 8.9–10.3)
CO2: 26 mmol/L (ref 22–32)
Chloride: 100 mmol/L — ABNORMAL LOW (ref 101–111)
Creatinine, Ser: 1.04 mg/dL — ABNORMAL HIGH (ref 0.44–1.00)
GFR calc non Af Amer: 50 mL/min — ABNORMAL LOW (ref >60)
GFR, EST AFRICAN AMERICAN: 58 mL/min — AB (ref >60)
Glucose, Bld: 83 mg/dL (ref 65–99)
POTASSIUM: 4 mmol/L (ref 3.5–5.1)
SODIUM: 134 mmol/L — AB (ref 135–145)
TOTAL PROTEIN: 6 g/dL — AB (ref 6.5–8.1)
Total Bilirubin: 0.1 mg/dL — ABNORMAL LOW (ref 0.3–1.2)

## 2014-10-15 LAB — ANAEROBIC CULTURE

## 2014-10-15 LAB — GLUCOSE, CAPILLARY
GLUCOSE-CAPILLARY: 166 mg/dL — AB (ref 65–99)
GLUCOSE-CAPILLARY: 76 mg/dL (ref 65–99)
GLUCOSE-CAPILLARY: 105 mg/dL — AB (ref 65–99)
Glucose-Capillary: 83 mg/dL (ref 65–99)

## 2014-10-15 MED ORDER — SODIUM CHLORIDE 0.9 % IJ SOLN
10.0000 mL | Freq: Two times a day (BID) | INTRAMUSCULAR | Status: DC
  Administered 2014-10-15 – 2014-10-17 (×4): 10 mL via INTRAVENOUS

## 2014-10-15 MED ORDER — SODIUM CHLORIDE 0.9 % IJ SOLN: 10.0000 mL | INTRAMUSCULAR | Status: DC | PRN

## 2014-10-15 NOTE — Care Management Note (Addendum)
Case Management Note  Patient Details  Name: Sharon [name] MRN: [id number] Date of Birth: Jun 09, 1933  Subjective/Objective:  Current discharge plan per Dr Sanjuan Dame note is for PICC Line today, and then discharge home with IV ABX on Tuesday. Currently pending an ID Consult. Will need an MD order for the IV antibiotic, and orders for home health RN. Earlier this week Ms Sharon [name] chose [facility] to be her home health provider, and reported that she has a walker at home.                 Action/Plan:   Expected Discharge Date:  10/12/14               Expected Discharge Plan:  [place]  In-House Referral:     Discharge planning Services  CM Consult  Post Acute Care Choice:  NA Choice offered to:  Patient  DME Arranged:  N/A DME Agency:  NA  HH Arranged:  RN, PT [place] Agency:  [facility]  Status of Service:  In process, will continue to follow  Medicare Important Message Given:  Yes-second notification given Date Medicare IM Given:    Medicare IM give by:    Date Additional Medicare IM Given:    Additional Medicare Important Message give by:     If discussed at [place] of Stay Meetings, dates discussed:    Additional Comments:  Derris Millan A, RN 10/15/2014, 9:43 AM

## 2014-10-15 NOTE — Progress Notes (Signed)
Patient ID: Sharon [name], female   DOB: 1933-12-07, 81 y.o.   MRN: [id number] Patient ID: Sharon [name], female   DOB: [birth year], 81 y.o.   MRN: [id number] Patient ID: Sharon [name], female   DOB: 04-24-1933, 81 y.o.   MRN: [id number] Patient ID: Sharon [name], female   DOB: September 05, 1933, 81 y.o.   MRN: [id number] Sharon [name] is a 81 y.o. female   SUBJECTIVE:  Patient admitted with cellulitis of the left lower leg with osteomyelitis of the ankle, no complaints this morning. Postop, mild pain  ______________________________________________________________________  ROS: Review of systems is unremarkable for any active cardiac,respiratory, GI, GU, hematologic, neurologic or psychiatric systems, 10 systems reviewed.  Marland Kitchen amiodarone  200 mg Oral Daily  . aspirin  81 mg Oral Daily  . cholecalciferol  1,000 Units Oral Daily  . docusate sodium  100 mg Oral BID  . enoxaparin (LOVENOX) injection  40 mg Subcutaneous Q24H  . ferrous sulfate  325 mg Oral Q breakfast  . gabapentin  300 mg Oral BID  . glipiZIDE  10 mg Oral QAC breakfast  . insulin aspart  0-5 Units Subcutaneous QHS  . insulin aspart  0-9 Units Subcutaneous TID WC  . insulin aspart  3 Units Subcutaneous TID WC  . levothyroxine  112 mcg Oral QAC breakfast  . lisinopril  5 mg Oral Daily  . metoprolol tartrate  25 mg Oral QHS  . multivitamin-lutein  1 capsule Oral Daily  . pantoprazole  40 mg Oral Daily  . simvastatin  40 mg Oral Daily  . vancomycin  1,000 mg Intravenous Q18H   acetaminophen **OR** acetaminophen, alum & mag hydroxide-simeth, fentaNYL (SUBLIMAZE) injection, HYDROmorphone (DILAUDID) injection, morphine injection, ondansetron (ZOFRAN) IV, temazepam   Past Medical History  Diagnosis Date  . CHF (congestive heart failure)   . Diabetes mellitus without complication   . Hyperlipidemia   . Atrial fibrillation   . Hypothyroid   . Hypertension   . Stroke   . Neuropathy   . Anemia   . Edema     feet/ankles occas     Past Surgical History  Procedure Laterality Date  . Hemiarthroplasty hip Right   . Hemiarthroplasty hip Left   . Cholecystectomy    . Appendectomy    . Cardiac catheterization  08/25/13  . Coronary angioplasty    . Cataract extraction w/phaco Left 07/20/2014    Procedure: CATARACT EXTRACTION PHACO AND INTRAOCULAR LENS PLACEMENT (IOC);  Surgeon: Leandrew Koyanagi, MD;  Location: [facility] ORS;  Service: Ophthalmology;  Laterality: Left;  [place]  1:18                 AP     23.6             CDE   9.69      lot #[id number]  . Incision and drainage Left 10/11/2014    Procedure: Removal of infected tibial sessmoid;  Surgeon: Albertine Patricia, DPM;  Location: [facility] ORS;  Service: Podiatry;  Laterality: Left;    PHYSICAL EXAM:  BP 135/51 mmHg  Pulse 56  Temp(Src) 97.6 F (36.4 C) (Oral)  Resp 20  Ht '5\' 5"'$  (1.651 m)  Wt 58.514 kg (129 lb)  BMI 21.47 kg/m2  SpO2 96%  Wt Readings from Last 3 Encounters:  10/10/14 58.514 kg (129 lb)  10/10/14 58.514 kg (129 lb)  08/22/14 61.236 kg (135 lb)           BP Readings from Last 3 Encounters:  10/15/14  135/51  08/22/14 154/57  07/20/14 147/37    Constitutional: NAD Neck: supple, no thyromegaly Respiratory: CTA, no rales or wheezes Cardiovascular: IRR, no murmur, no gallop Abdomen: soft, good BS, nontender Extremities: no edema, skin erythema resolved, ulcer left ankle Neuro: alert , not oriented, no focal motor or sensory deficits  ASSESSMENT/PLAN:  Labs and imaging studies were reviewed  Ankle osteomyelitis/cellulitis-IV antibiotics, post surgical debridement, surgical care, MRSA, IV vancomycin Severe peripheral neuropathy-patient unable to feel her feet, gabapentin Diabetes mellitus-sliding scale with OHA, stable A. fib-rate controlled, continue medications History GI bleed-continue iron, follow hemoglobin Dementia-mild, chronic Podiatry recommends home IV antibiotics, PICC line placement today, approve IV vancomycin, ID consult, home  Tuesday morning

## 2014-10-15 NOTE — Progress Notes (Addendum)
F.u great toe i & D. No pain today. Dressing changed with scant serrous drainage.  No foul odor or erythema. Recommend 3 x's per week dressing changes with dry bulky dressing. Remain minimal WB to heel in post-op shoe. F/U with Dr. Elvina Mattes this week. Suspect will be d/c'd tomorrow by medicine.  Spoke with Dr. Sabra Heck and recommended PICC placement with IV vancomycin for at least 2 weeks as well as f/u with ID for assistance with Vancomycin management.  PICC ordered placed.

## 2014-10-15 NOTE — Progress Notes (Signed)
Had conversation with pt about possible discharge on Monday or Tuesday of this week. She started crying and said "I can't go home tomorrow, my son can't come pick me up". Reassured pt that MD & case manager would speak with her about discharge plans tomorrow during business hours. She had me call her son to speak with him. Son stated that he wanted to speak with MD & case manager regarding her discharge. He is hesitant about her discharging home on IV antibiotics as she lives alone. Educated son on home health but he still has concerns. Will report to day shift nurse.

## 2014-10-15 NOTE — Progress Notes (Signed)
Physical Therapy Treatment Patient Details Name: Sharon [name] MRN: [id number] DOB: 12/25/1933 Today's Date: 10/15/2014    History of Present Illness Pt is a 81yo white female with PMH DM, who presented to [facility] with full thickness wound on plantar surface of the L foot near the 1st MTP. Pt found to have infection and underwent surgical procedure to debride area, remove the tibial sesamoid, and release the 1st toe extensor.      PT Comments    Pt tolerating treatment session well, motivated and able to complete entire PT sesssion as planned. Pt continues to make progress toward goals as evidenced by progression balance training, and improved balance during gait on othro-wedge boot. Pt's greatest limitation continues to be dynamic balance (limited by posterior weight bearing ortho shoe) which continues to limit ability to perform weight bearing activities without UE support at baseline function. Patient presenting with impairment of strength, pain, range of motion, balance, and activity tolerance, limiting ability to perform ADL and mobility tasks at  baseline level of function. Patient will benefit from skilled intervention to address the above impairments and limitations, in order to restore to prior level of function, improve patient safety upon discharge, and to decrease caregiver burden.    Follow Up Recommendations  Home health PT     Equipment Recommendations  None recommended by PT    Recommendations for Other Services       Precautions / Restrictions Precautions Precautions: Fall Precaution Comments: Isolation contact precautions; Per verbal from Dr. Elvina Mattes: pt is WBAT for heel weight bearing only in ortho wedge shoe on L side.  Required Braces or Orthoses: Other Brace/Splint Restrictions Weight Bearing Restrictions: Yes LLE Weight Bearing: Weight bearing as tolerated    Mobility  Bed Mobility Overal bed mobility: Independent                Transfers Overall  transfer level: Modified independent Equipment used: Rolling walker (2 wheeled)             General transfer comment: Practiced STS 10x without RW for strength and balance.   Ambulation/Gait Ambulation/Gait assistance: Min guard Ambulation Distance (Feet): 15 Feet Assistive device: Rolling walker (2 wheeled)       General Gait Details: In room only; greater emphasis on balance training today.    Stairs            Wheelchair Mobility    Modified Rankin (Stroke Patients Only)       Balance Overall balance assessment: Needs assistance Sitting-balance support: Feet supported Sitting balance-Leahy Scale: Normal     Standing balance support: No upper extremity supported Standing balance-Leahy Scale: Poor Standing balance comment: several LOB requiring minA and/or single UE support to recover (during balance training)                High Level Balance Comments: 3x30 seconds- narrow stance, no UE support; narrow stance RUE support head turns R/L x10, sagittal gaze ROM x10.     Cognition Arousal/Alertness: Awake/alert Behavior During Therapy: WFL for tasks assessed/performed Overall Cognitive Status: Within Functional Limits for tasks assessed                      Exercises      General Comments        Pertinent Vitals/Pain Pain Assessment: No/denies pain    Home Living                      Prior Function  PT Goals (current goals can now be found in the care plan section) Acute Rehab PT Goals Patient Stated Goal: D/C home PT Goal Formulation: With patient Time For Goal Achievement: 10/26/14 Potential to Achieve Goals: Good Progress towards PT goals: Progressing toward goals    Frequency  7X/week    PT Plan Current plan remains appropriate    Co-evaluation             End of Session Equipment Utilized During Treatment: Gait belt;Other (comment) Activity Tolerance: Patient tolerated treatment well;No  increased pain Patient left: in chair;with call bell/phone within reach;with chair alarm set     Time: 0630-1601 PT Time Calculation (min) (ACUTE ONLY): 17 min  Charges:  $Neuromuscular Re-education: 8-22 mins                    G Codes:      Pharoah Goggins C 11-08-14, 12:45 PM  12:47 PM  Etta Grandchild, PT, DPT [place] License # [id number]

## 2014-10-16 LAB — GLUCOSE, CAPILLARY
GLUCOSE-CAPILLARY: 101 mg/dL — AB (ref 65–99)
GLUCOSE-CAPILLARY: 147 mg/dL — AB (ref 65–99)
Glucose-Capillary: 135 mg/dL — ABNORMAL HIGH (ref 65–99)
Glucose-Capillary: 109 mg/dL — ABNORMAL HIGH (ref 65–99)

## 2014-10-16 LAB — ANAEROBIC CULTURE

## 2014-10-16 MED ORDER — METRONIDAZOLE 500 MG PO TABS: 500.0000 mg | ORAL_TABLET | Freq: Three times a day (TID) | ORAL | Status: DC

## 2014-10-16 MED ORDER — CIPROFLOXACIN HCL 500 MG PO TABS
500.0000 mg | ORAL_TABLET | Freq: Two times a day (BID) | ORAL | Status: DC
  Administered 2014-10-16 – 2014-10-17 (×2): 500 mg via ORAL
  Filled 2014-10-16 (×3): qty 1

## 2014-10-16 MED ORDER — HYDROCODONE-ACETAMINOPHEN 5-325 MG PO TABS: 1.0000 | ORAL_TABLET | Freq: Four times a day (QID) | ORAL | Status: DC | PRN

## 2014-10-16 MED ORDER — PANTOPRAZOLE SODIUM 40 MG PO TBEC: 40.0000 mg | DELAYED_RELEASE_TABLET | Freq: Every day | ORAL | Status: DC

## 2014-10-16 MED ORDER — ACETAMINOPHEN 325 MG PO TABS: 650.0000 mg | ORAL_TABLET | Freq: Four times a day (QID) | ORAL | Status: DC | PRN

## 2014-10-16 MED ORDER — HYDROCODONE-ACETAMINOPHEN 5-325 MG PO TABS
1.0000 | ORAL_TABLET | Freq: Four times a day (QID) | ORAL | Status: DC | PRN
  Administered 2014-10-16 – 2014-10-17 (×2): 1 via ORAL
  Filled 2014-10-16 (×2): qty 1

## 2014-10-16 MED ORDER — VANCOMYCIN HCL IN DEXTROSE 1-5 GM/200ML-% IV SOLN: 1000.0000 mg | INTRAVENOUS | Status: DC

## 2014-10-16 MED ORDER — CIPROFLOXACIN HCL 500 MG PO TABS: 500.0000 mg | ORAL_TABLET | Freq: Two times a day (BID) | ORAL | Status: DC

## 2014-10-16 MED ORDER — METRONIDAZOLE 500 MG PO TABS
500.0000 mg | ORAL_TABLET | Freq: Three times a day (TID) | ORAL | Status: DC
  Administered 2014-10-16 – 2014-10-17 (×3): 500 mg via ORAL
  Filled 2014-10-16 (×3): qty 1

## 2014-10-16 MED ORDER — DOCUSATE SODIUM 100 MG PO CAPS: 100.0000 mg | ORAL_CAPSULE | Freq: Two times a day (BID) | ORAL | Status: DC

## 2014-10-16 NOTE — Care Management (Signed)
Will need  IV Vancomycin  At home per MD notes  informed [place]. Anticipate discharge soon.

## 2014-10-16 NOTE — Consult Note (Signed)
[facility] Infectious Disease     Reason for Consult: Osteomyelitis   Referring Physician: Emily Filbert Date of Admission:  10/10/2014   Active Problems:   Diabetic foot infection ([place])   HPI: Sharon [name] is a 81 y.o. female with DM, admitted with diabetic foot infection with plantar ulcer and underlying osteomyelitis based on MRI.  Dr Elvina Mattes performed I and D and excision of infected tibial sesamoid bone.  Cx with MRSA, Providencia and fusobacterium.  Has been treated with vanco, and ceftraixone Currently feels improved, no fevers, no pain.  Past Medical History  Diagnosis Date  . CHF (congestive heart failure)   . Diabetes mellitus without complication   . Hyperlipidemia   . Atrial fibrillation   . Hypothyroid   . Hypertension   . Stroke   . Neuropathy   . Anemia   . Edema     feet/ankles occas   Past Surgical History  Procedure Laterality Date  . Hemiarthroplasty hip Right   . Hemiarthroplasty hip Left   . Cholecystectomy    . Appendectomy    . Cardiac catheterization  08/25/13  . Coronary angioplasty    . Cataract extraction w/phaco Left 07/20/2014    Procedure: CATARACT EXTRACTION PHACO AND INTRAOCULAR LENS PLACEMENT (IOC);  Surgeon: Leandrew Koyanagi, MD;  Location: [facility] ORS;  Service: Ophthalmology;  Laterality: Left;  [place]  1:18                 AP     23.6             CDE   9.69      lot #[id number]  . Incision and drainage Left 10/11/2014    Procedure: Removal of infected tibial sessmoid;  Surgeon: Albertine Patricia, DPM;  Location: [facility] ORS;  Service: Podiatry;  Laterality: Left;   Social History  Substance Use Topics  . Smoking status: Former Smoker -- 0.50 packs/day for 7 years    Types: Cigarettes    Quit date: 05/31/1998  . Smokeless tobacco: Never Used  . Alcohol Use: No   Family History  Problem Relation Age of Onset  . Heart attack Mother     Allergies:  Allergies  Allergen Reactions  . Codeine Other (See Comments)    unknown  . Cymbalta  [Duloxetine Hcl] Other (See Comments)    Confusion, disorientation    Current antibiotics: Antibiotics Given (last 72 hours)    Date/Time Action Medication Dose Rate   10/14/14 1318 Given   vancomycin (VANCOCIN) IVPB 1000 mg/200 mL premix 1,000 mg 200 mL/hr   10/15/14 1000 Given   vancomycin (VANCOCIN) IVPB 1000 mg/200 mL premix 1,000 mg 200 mL/hr   10/16/14 0542 Given   vancomycin (VANCOCIN) IVPB 1000 mg/200 mL premix 1,000 mg 200 mL/hr      MEDICATIONS: . amiodarone  200 mg Oral Daily  . aspirin  81 mg Oral Daily  . cholecalciferol  1,000 Units Oral Daily  . docusate sodium  100 mg Oral BID  . enoxaparin (LOVENOX) injection  40 mg Subcutaneous Q24H  . ferrous sulfate  325 mg Oral Q breakfast  . gabapentin  300 mg Oral BID  . glipiZIDE  10 mg Oral QAC breakfast  . insulin aspart  0-5 Units Subcutaneous QHS  . insulin aspart  0-9 Units Subcutaneous TID WC  . insulin aspart  3 Units Subcutaneous TID WC  . levothyroxine  112 mcg Oral QAC breakfast  . lisinopril  5 mg Oral Daily  . metoprolol tartrate  25 mg Oral QHS  . multivitamin-lutein  1 capsule Oral Daily  . pantoprazole  40 mg Oral Daily  . simvastatin  40 mg Oral Daily  . sodium chloride  10 mL Intravenous Q12H  . vancomycin  1,000 mg Intravenous Q18H    Review of Systems - 11 systems reviewed and negative per HPI   OBJECTIVE: Temp:  [97.5 F (36.4 C)] 97.5 F (36.4 C) (10/03 0953) Pulse Rate:  [56-61] 56 (10/03 1300) Resp:  [16-17] 17 (10/03 0953) BP: (80-144)/(60-92) 144/60 mmHg (10/03 1300) SpO2:  [97 %-100 %] 98 % (10/03 1300) Physical Exam  Constitutional:  oriented to person, place, and time. Thin, frail HENT: [place]/AT, PERRLA, no scleral icterus Mouth/Throat: Oropharynx is clear and moist. No oropharyngeal exudate.  Cardiovascular: Normal rate, regular rhythm and normal heart sounds. Exam reveals no gallop and no friction rub.  No murmur heard.  Pulmonary/Chest: Effort normal and breath sounds normal.  No respiratory distress.  has no wheezes.  Neck =supple, no nuchal rigidity Abdominal: Soft. Bowel sounds are normal.  exhibits no distension. There is no tenderness.  Lymphadenopathy: no cervical adenopathy. No axillary adenopathy PICC RUE WNL Neurological: alert and oriented to person, place, and time.  Skin: LLE wrapped post op Psychiatric: a normal mood and affect.  behavior is normal.    LABS: Results for orders placed or performed during the hospital encounter of 10/10/14 (from the past 48 hour(s))  Glucose, capillary     Status: None   Collection Time: 10/14/14  4:51 PM  Result Value Ref Range   Glucose-Capillary 96 65 - 99 mg/dL  CBC with Differential/Platelet     Status: Abnormal   Collection Time: 10/15/14  6:35 AM  Result Value Ref Range   WBC 6.8 3.6 - 11.0 K/uL   RBC 3.36 (L) 3.80 - 5.20 MIL/uL   Hemoglobin 8.6 (L) 12.0 - 16.0 g/dL   HCT 27.1 (L) 35.0 - 47.0 %   MCV 80.6 80.0 - 100.0 fL   MCH 25.7 (L) 26.0 - 34.0 pg   MCHC 31.9 (L) 32.0 - 36.0 g/dL   RDW 16.7 (H) 11.5 - 14.5 %   Platelets 312 150 - 440 K/uL   Neutrophils Relative % 57 %   Neutro Abs 3.9 1.4 - 6.5 K/uL   Lymphocytes Relative 26 %   Lymphs Abs 1.8 1.0 - 3.6 K/uL   Monocytes Relative 9 %   Monocytes Absolute 0.6 0.2 - 0.9 K/uL   Eosinophils Relative 5 %   Eosinophils Absolute 0.3 0 - 0.7 K/uL   Basophils Relative 3 %   Basophils Absolute 0.2 (H) 0 - 0.1 K/uL  Comprehensive metabolic panel     Status: Abnormal   Collection Time: 10/15/14  6:35 AM  Result Value Ref Range   Sodium 134 (L) 135 - 145 mmol/L   Potassium 4.0 3.5 - 5.1 mmol/L   Chloride 100 (L) 101 - 111 mmol/L   CO2 26 22 - 32 mmol/L   Glucose, Bld 83 65 - 99 mg/dL   BUN 17 6 - 20 mg/dL   Creatinine, Ser 1.04 (H) 0.44 - 1.00 mg/dL   Calcium 8.6 (L) 8.9 - 10.3 mg/dL   Total Protein 6.0 (L) 6.5 - 8.1 g/dL   Albumin 2.8 (L) 3.5 - 5.0 g/dL   AST 75 (H) 15 - 41 U/L   ALT 59 (H) 14 - 54 U/L   Alkaline Phosphatase 222 (H) 38 - 126 U/L    Total Bilirubin 0.1 (L)  0.3 - 1.2 mg/dL   GFR calc non Af Amer 50 (L) >60 mL/min   GFR calc Af Amer 58 (L) >60 mL/min    Comment: (NOTE) The eGFR has been calculated using the CKD EPI equation. This calculation has not been validated in all clinical situations. eGFR's persistently <60 mL/min signify possible Chronic Kidney Disease.    Anion gap 8 5 - 15  Glucose, capillary     Status: None   Collection Time: 10/15/14  7:35 AM  Result Value Ref Range   Glucose-Capillary 83 65 - 99 mg/dL  Glucose, capillary     Status: Abnormal   Collection Time: 10/15/14 11:30 AM  Result Value Ref Range   Glucose-Capillary 166 (H) 65 - 99 mg/dL  Glucose, capillary     Status: None   Collection Time: 10/15/14  4:00 PM  Result Value Ref Range   Glucose-Capillary 76 65 - 99 mg/dL  Glucose, capillary     Status: Abnormal   Collection Time: 10/15/14  9:10 PM  Result Value Ref Range   Glucose-Capillary 105 (H) 65 - 99 mg/dL  Glucose, capillary     Status: Abnormal   Collection Time: 10/16/14  7:51 AM  Result Value Ref Range   Glucose-Capillary 101 (H) 65 - 99 mg/dL   Comment 1 Notify RN   Glucose, capillary     Status: Abnormal   Collection Time: 10/16/14 11:33 AM  Result Value Ref Range   Glucose-Capillary 135 (H) 65 - 99 mg/dL   Comment 1 Notify RN   Glucose, capillary     Status: Abnormal   Collection Time: 10/16/14  4:08 PM  Result Value Ref Range   Glucose-Capillary 147 (H) 65 - 99 mg/dL   No components found for: ESR, C REACTIVE PROTEIN MICRO: Recent Results (from the past 720 hour(s))  Wound culture     Status: None   Collection Time: 10/09/14  3:00 PM  Result Value Ref Range Status   Specimen Description FOOT LEFT  Final   Special Requests NONE  Final   Gram Stain   Final    RARE WBC SEEN FEW GRAM POSITIVE COCCI IN CLUSTERS    Culture   Final    LIGHT GROWTH METHICILLIN RESISTANT STAPHYLOCOCCUS AUREUS LIGHT GROWTH PROVIDENCIA RETTGERI CRITICAL RESULT CALLED TO, READ BACK BY  AND VERIFIED WITH: MICHAEL JACOBS AT 1118 10/11/14 DV    Report Status 10/13/2014 FINAL  Final   Organism ID, Bacteria METHICILLIN RESISTANT STAPHYLOCOCCUS AUREUS  Final   Organism ID, Bacteria PROVIDENCIA RETTGERI  Final      Susceptibility   Providencia rettgeri - MIC*    AMPICILLIN <=2 RESISTANT Resistant     CEFAZOLIN >=64 RESISTANT Resistant     CEFTRIAXONE <=1 SENSITIVE Sensitive     CIPROFLOXACIN <=0.25 SENSITIVE Sensitive     GENTAMICIN <=1 SENSITIVE Sensitive     IMIPENEM 1 SENSITIVE Sensitive     NITROFURANTOIN 256 RESISTANT Resistant     TRIMETH/SULFA <=20 SENSITIVE Sensitive     PIP/TAZO Value in next row Sensitive      SENSITIVE<=4    * LIGHT GROWTH PROVIDENCIA RETTGERI   Methicillin resistant staphylococcus aureus - MIC*    CIPROFLOXACIN Value in next row Resistant      SENSITIVE<=4    GENTAMICIN Value in next row Sensitive      SENSITIVE<=4    OXACILLIN Value in next row Resistant      SENSITIVE<=4    VANCOMYCIN Value in next row Sensitive  SENSITIVE<=4    TRIMETH/SULFA Value in next row Sensitive      SENSITIVE<=4    CEFOXITIN SCREEN Value in next row Resistant      POSITIVECEFOXITIN SCREEN - This test may be used to predict mecA-mediated oxacillin resistance, and it is based on the cefoxitin disk screen test.  The cefoxitin screen and oxacillin work in combination to determine the final interpretation reported for oxacillin.     Inducible Clindamycin Value in next row Resistant      POSITIVEINDUCIBLE CLINDAMYCIN RESISTANCE - A positive ICR test is indicative of inducible resistance to macrolides, lincosamides, and type B streptogramin.  This isolate is presumed to be resistant to Clindamycin, however, Clindamycin may still be effective in some patients.     TETRACYCLINE Value in next row Sensitive      SENSITIVE<=1    * LIGHT GROWTH METHICILLIN RESISTANT STAPHYLOCOCCUS AUREUS  Anaerobic culture     Status: None   Collection Time: 10/09/14  3:00 PM  Result  Value Ref Range Status   Specimen Description FOOT LEFT  Final   Special Requests NONE  Final   Culture LIGHT GROWTH FUSOBACTERIUM NUCLEATUM  Final   Report Status 10/16/2014 FINAL  Final   Organism ID, Bacteria FUSOBACTERIUM NUCLEATUM  Final  Anaerobic culture     Status: None   Collection Time: 10/11/14 12:50 PM  Result Value Ref Range Status   Specimen Description FOOT  Final   Special Requests NONE  Final   Culture   Final    NO ANAEROBES ISOLATED; CULTURE IN PROGRESS FOR 5 DAYS   Report Status 10/15/2014 FINAL  Final  Tissue culture     Status: None   Collection Time: 10/11/14 12:50 PM  Result Value Ref Range Status   Specimen Description FOOT  Final   Special Requests NONE  Final   Gram Stain   Final    RARE WBC SEEN FEW GRAM POSITIVE COCCI RARE GRAM VARIABLE ROD    Culture   Final    LIGHT GROWTH METHICILLIN RESISTANT STAPHYLOCOCCUS AUREUS CRITICAL VALUE NOTED.  VALUE IS CONSISTENT WITH PREVIOUSLY REPORTED AND CALLED VALUE.    Report Status 10/15/2014 FINAL  Final   Organism ID, Bacteria METHICILLIN RESISTANT STAPHYLOCOCCUS AUREUS  Final      Susceptibility   Methicillin resistant staphylococcus aureus - MIC*    CIPROFLOXACIN >=8 RESISTANT Resistant     GENTAMICIN <=0.5 SENSITIVE Sensitive     OXACILLIN >=4 RESISTANT Resistant     VANCOMYCIN 1 SENSITIVE Sensitive     TRIMETH/SULFA <=10 SENSITIVE Sensitive     CEFOXITIN SCREEN Value in next row Resistant      POSITIVECEFOXITIN SCREEN - This test may be used to predict mecA-mediated oxacillin resistance, and it is based on the cefoxitin disk screen test.  The cefoxitin screen and oxacillin work in combination to determine the final interpretation reported for oxacillin.     Inducible Clindamycin Value in next row Resistant      POSITIVEINDUCIBLE CLINDAMYCIN RESISTANCE - A positive ICR test is indicative of inducible resistance to macrolides, lincosamides, and type B streptogramin.  This isolate is presumed to be resistant  to Clindamycin, however, Clindamycin may still be effective in some patients.     TETRACYCLINE Value in next row Sensitive      SENSITIVE<=1    * LIGHT GROWTH METHICILLIN RESISTANT STAPHYLOCOCCUS AUREUS    IMAGING: Dg Chest 1 View  10/15/2014   CLINICAL DATA:  Repositioned right PICC line.  EXAM: CHEST 1 VIEW  COMPARISON:  10/15/2014 and prior radiographs  FINDINGS: A right PICC line has been repositioned with tip overlying the mid SVC.  Upper limits normal heart size and mild peribronchial thickening again noted.  There is no evidence of focal airspace disease, pulmonary edema, suspicious pulmonary nodule/mass, pleural effusion, or pneumothorax.  No acute bony abnormalities are identified.  IMPRESSION: Right PICC line with tip overlying the mid SVC.   Electronically Signed   By: Margarette Canada M.D.   On: 10/15/2014 16:16   Dg Chest 1 View  10/15/2014   CLINICAL DATA:  PICC line placement. Congestive heart failure. Diabetes.  EXAM: CHEST 1 VIEW  COMPARISON:  08/22/2014  FINDINGS: Right-sided PICC line extends into the right-sided neck, its tip is not visualized. Midline trachea. Normal heart size. Atherosclerosis in the transverse aorta. No pleural effusion or pneumothorax. Biapical pleural thickening. Interstitial thickening again identified. Interstitial and airspace opacities are significantly improved.  IMPRESSION: Right-sided PICC line extending into the right neck. Recommend repositioning. Critical test results telephoned to [place], r.n. at the time of interpretation at . 2:44 p.m.on 10/15/2014.  Improved aeration. Mild interstitial thickening remains and could relate to the history of smoking/ chronic bronchitis or superimposed interstitial lung disease.  Aortic atherosclerosis.   Electronically Signed   By: Abigail Miyamoto M.D.   On: 10/15/2014 14:45   Mr Foot Left W Wo Contrast  10/10/2014   CLINICAL DATA:  Full-thickness ulcer involving the plantar aspect of the forefoot located at the first  metatarsal head area.  EXAM: MRI OF THE LEFT FOREFOOT WITHOUT AND WITH CONTRAST  TECHNIQUE: Multiplanar, multisequence MR imaging was performed both before and after administration of intravenous contrast.  CONTRAST:  61m MULTIHANCE GADOBENATE DIMEGLUMINE 529 MG/ML IV SOLN  COMPARISON:  None.  FINDINGS: There is an open wound on the plantar aspect of the foot at the level of the first metatarsal head. MR findings consistent with septic arthritis involving the first metatarsal phalangeal joint and osteomyelitis involving the first metatarsal and first proximal phalanx. There is also osteomyelitis involving the medial sesamoiditis of the great toe. There is diffuse cellulitis and myositis.  There is a oblique coursing fracture of the medial malleolus at and above the level of the ankle mortise. This may be a subacute fracture. The fibula is intact. The tibiotalar joint is intact. The subtalar joints appear normal. The major ankle ligaments and tendons are intact.  IMPRESSION: Open wound on the plantar aspect of the forefoot at the level of the first metatarsal head. There is underlying septic arthritis and osteomyelitis as discussed above.  Diffuse cellulitis and myositis.  Nondisplaced fracture of the medial malleolus.   Electronically Signed   By: PMarijo SanesM.D.   On: 10/10/2014 15:35   [place]Venous Img Lower Unilateral Left  09/29/2014   CLINICAL DATA:  Left lower extremity swelling for the past 2 days. Evaluate for DVT.  EXAM: LEFT LOWER EXTREMITY VENOUS DOPPLER ULTRASOUND  TECHNIQUE: Gray-scale sonography with graded compression, as well as color Doppler and duplex ultrasound were performed to evaluate the lower extremity deep venous systems from the level of the common femoral vein and including the common femoral, femoral, profunda femoral, popliteal and calf veins including the posterior tibial, peroneal and gastrocnemius veins when visible. The superficial great saphenous vein was also interrogated.  Spectral Doppler was utilized to evaluate flow at rest and with distal augmentation maneuvers in the common femoral, femoral and popliteal veins.  COMPARISON:  Bilateral lower extremity venous Doppler ultrasound -  05/26/2012  FINDINGS: Contralateral Common Femoral Vein: Respiratory phasicity is normal and symmetric with the symptomatic side. No evidence of thrombus. Normal compressibility.  Common Femoral Vein: No evidence of thrombus. Normal compressibility, respiratory phasicity and response to augmentation.  Saphenofemoral Junction: No evidence of thrombus. Normal compressibility and flow on color Doppler imaging.  Profunda Femoral Vein: No evidence of thrombus. Normal compressibility and flow on color Doppler imaging.  Femoral Vein: No evidence of thrombus. Normal compressibility, respiratory phasicity and response to augmentation.  Popliteal Vein: No evidence of thrombus. Normal compressibility, respiratory phasicity and response to augmentation.  Calf Veins: No evidence of thrombus. Normal compressibility and flow on color Doppler imaging.  Superficial Great Saphenous Vein: No evidence of thrombus. Normal compressibility and flow on color Doppler imaging.  Venous Reflux:  None.  Other Findings: A minimal amount of subcutaneous edema is noted at the level of the left lower leg (images 17 and 18).  IMPRESSION: No evidence DVT within the left lower extremity.   Electronically Signed   By: Sandi Mariscal M.D.   On: 09/29/2014 12:05    Assessment:   Sharon [name] is a 81 y.o. female with diabetic neuropathy and  foot ulcer and osteomyelitis, s.p debridement 9/28 with cx growing MRSA, Providencia, Fusobacterium  Will need IV vanco and can use oral cipro for the providencia and oral flagyl for anaerobes (fusobacterium) Recommendations Picc has been placed  Orders written in separate progress note for IV abx and weekly labs IV vanco, oral cipro/flagyl  I will see in 2 weeks  Thank you very much for  allowing me to participate in the care of this patient. Please call with questions.   Cheral Marker. Ola Spurr, MD

## 2014-10-16 NOTE — Progress Notes (Signed)
Physical Therapy Treatment Patient Details Name: Sharon [name] MRN: [id number] DOB: [birth year] Today's Date: 10/16/2014    History of Present Illness Pt is a 81yo white female with PMH DM, who presented to [facility] with full thickness wound on plantar surface of the L foot near the 1st MTP. Pt found to have infection and underwent surgical procedure to debride area, remove the tibial sesamoid, and release the 1st toe extensor.      PT Comments    Last vitals at 13:00 show BP to be 144/60. Spoke with primary PT this a.m who noted he requested further clarification on patients current weightbearing status. Found no further clarification in the chart this p.m; therefore limited ambulation to only necessary ambulation with off loading shoes in place. Pt ambulation in room around bed to chair and participates in bed/chair exercises for the lower extremities. Pt tends to cross legs in bed and in chair; educated pt on avoid to allow for optimal circulation in the lower extremities; patient states understanding. Continue PT for progression of strength, balance and ambulation as allowed. Spoke with care management and plan is for pt to discharge to [facility] as soon as tomorrow.   Follow Up Recommendations  SNF     Equipment Recommendations  None recommended by PT    Recommendations for Other Services       Precautions / Restrictions Precautions Precautions: Fall Precaution Comments: Isolation contact precautions; Per verbal from Dr. Elvina Mattes: pt is WBAT for heel weight bearing only in ortho wedge shoe on L side.  Required Braces or Orthoses: Other Brace/Splint Other Brace/Splint: Off loading shoes Restrictions Weight Bearing Restrictions: Yes LLE Weight Bearing: Weight bearing as tolerated    Mobility  Bed Mobility Overal bed mobility: Independent                Transfers Overall transfer level: Modified independent Equipment used: Rolling walker (2 wheeled)                 Ambulation/Gait Ambulation/Gait assistance: Min guard Ambulation Distance (Feet): 20 Feet Assistive device: Rolling walker (2 wheeled) Gait Pattern/deviations: Step-through pattern (off loading shoes) Gait velocity: decreased   General Gait Details:  (In room around bed to chair)   Stairs            Wheelchair Mobility    Modified Rankin (Stroke Patients Only)       Balance           Standing balance support: Bilateral upper extremity supported Standing balance-Leahy Scale: Fair                      Cognition Arousal/Alertness: Awake/alert Behavior During Therapy: WFL for tasks assessed/performed Overall Cognitive Status: Within Functional Limits for tasks assessed                      Exercises General Exercises - Lower Extremity Ankle Circles/Pumps: AROM;Both;20 reps;Seated Quad Sets: Strengthening;Both;20 reps;Supine Gluteal Sets: Strengthening;Both;20 reps;Supine Long Arc Quad: AROM;Both;20 reps;Seated Heel Slides: AROM;Both;20 reps;Supine Hip ABduction/ADduction: AROM;Both;20 reps;Supine Hip Flexion/Marching: AROM;Both;20 reps;Seated    General Comments        Pertinent Vitals/Pain Pain Assessment: No/denies pain    Home Living                      Prior Function            PT Goals (current goals can now be found in the care plan section) Progress  towards PT goals: Progressing toward goals    Frequency  7X/week    PT Plan Discharge plan needs to be updated    Co-evaluation             End of Session Equipment Utilized During Treatment: Other (comment) (off loading shoes) Activity Tolerance: Patient tolerated treatment well;No increased pain Patient left: in chair;with call bell/phone within reach;with chair alarm set     Time: 7078-6754 PT Time Calculation (min) (ACUTE ONLY): 23 min  Charges:  $Gait Training: 8-22 mins $Therapeutic Exercise: 8-22 mins                    G Codes:      Sharon [name] 10/16/2014, 3:02 PM

## 2014-10-16 NOTE — Care Management Important Message (Signed)
Important Message  Patient Details  Name: Sharon [name] MRN: [id number] Date of Birth: 09-22-1933   Medicare Important Message Given:  Yes-third notification given    Alvie Heidelberg, RN 10/16/2014, 9:37 AM

## 2014-10-16 NOTE — Care Management (Signed)
CSW following for placement to STR/SNF. Anticipate discharge tomorrow to [facility].

## 2014-10-16 NOTE — Progress Notes (Signed)
PT Cancellation Note  Patient Details Name: Sharon [name] MRN: [id number] DOB: 05-03-1933   Cancelled Treatment:     Patient noted to have recent BP of 80/60, which is not appropriate for OOB mobility which requires further assessment from rehab staff. PT called Podiatry department and spoke to RN this morning to clarify patient's weight bearing status. RN stated patient could ambulate short distances with forefoot offloading boot on at all times with OOB mobility, but should limit her ambulation to as necessary (not exercise) and elevate 95% of the time. PT asked for official WB status note and order be in place in [facility] for further clarification. PT will re-attempt mobility at a later time given above medical concerns.   Kerman Passey, PT, DPT    10/16/2014, 11:08 AM

## 2014-10-16 NOTE — Progress Notes (Signed)
Patient Demographics  Cassidee Deats, is a 81 y.o. female   MRN: [id number]   DOB - Jan 06, 1934  Admit Date - 10/10/2014    Outpatient Primary MD for the patient is Rusty Aus., MD  Consult requested in the Hospital by Rusty Aus, MD, On 10/16/2014       With History of -  Past Medical History  Diagnosis Date  . CHF (congestive heart failure)   . Diabetes mellitus without complication   . Hyperlipidemia   . Atrial fibrillation   . Hypothyroid   . Hypertension   . Stroke   . Neuropathy   . Anemia   . Edema     feet/ankles occas      Past Surgical History  Procedure Laterality Date  . Hemiarthroplasty hip Right   . Hemiarthroplasty hip Left   . Cholecystectomy    . Appendectomy    . Cardiac catheterization  08/25/13  . Coronary angioplasty    . Cataract extraction w/phaco Left 07/20/2014    Procedure: CATARACT EXTRACTION PHACO AND INTRAOCULAR LENS PLACEMENT (IOC);  Surgeon: Leandrew Koyanagi, MD;  Location: [facility] ORS;  Service: Ophthalmology;  Laterality: Left;  [place]  1:18                 AP     23.6             CDE   9.69      lot #[id number]  . Incision and drainage Left 10/11/2014    Procedure: Removal of infected tibial sessmoid;  Surgeon: Albertine Patricia, DPM;  Location: [facility] ORS;  Service: Podiatry;  Laterality: Left;    in for   No chief complaint on file.    HPI  Somer Trotter  is a 81 y.o. female, and hospitalized for osteomyelitis to the left foot. Underwent surgery last week for removal of infected tibial sesamoid bone she did well without we're able to close it primarily with antibiotic beads with vancomycin and gentamicin.    Review of Systems    In addition to the HPI above,  No Fever-chills, No Headache, No changes with Vision or hearing, No problems swallowing food or  Liquids, No Chest pain, Cough or Shortness of Breath, No Abdominal pain, No Nausea or Vommitting, Bowel movements are regular, No Blood in stool or Urine, No dysuria, No new skin rashes or bruises, No new joints pains-aches,  No new weakness, tingling, numbness in any extremity, No recent weight gain or loss, No polyuria, polydypsia or polyphagia, No significant Mental Stressors.  A full 10 point Review of Systems was done, except as stated above, all other Review of Systems were negative.   Social History Social History  Substance Use Topics  . Smoking status: Former Smoker -- 0.50 packs/day for 7 years    Types: Cigarettes    Quit date: 05/31/1998  . Smokeless tobacco: Never Used  . Alcohol Use: No     Family History Family History  Problem Relation Age of Onset  .  Heart attack Mother      Prior to Admission medications   Medication Sig Start Date End Date Taking? Authorizing Provider  amiodarone (PACERONE) 200 MG tablet Take 200 mg by mouth daily.   Yes Historical Provider, MD  aspirin 81 MG tablet Take 81 mg by mouth daily.   Yes Historical Provider, MD  Cholecalciferol 1000 UNITS capsule Take 1,000 Units by mouth daily.   Yes Historical Provider, MD  ferrous sulfate 325 (65 FE) MG tablet Take 325 mg by mouth daily with breakfast.   Yes Historical Provider, MD  gabapentin (NEURONTIN) 300 MG capsule Take 300 mg by mouth 2 (two) times daily.   Yes Historical Provider, MD  glipiZIDE (GLUCOTROL) 10 MG tablet Take by mouth daily before breakfast.   Yes Historical Provider, MD  glyBURIDE-metformin (GLUCOVANCE) 5-500 MG per tablet Take 1 tablet by mouth daily with breakfast.   Yes Historical Provider, MD  levothyroxine (SYNTHROID, LEVOTHROID) 112 MCG tablet Take 112 mcg by mouth daily before breakfast.   Yes Historical Provider, MD  lisinopril (PRINIVIL,ZESTRIL) 5 MG tablet Take 5 mg by mouth daily.   Yes Historical Provider, MD  metoprolol tartrate (LOPRESSOR) 25 MG tablet  Take 25 mg by mouth at bedtime.    Yes Historical Provider, MD  Multiple Vitamins-Minerals (PRESERVISION AREDS) TABS Take 1 tablet by mouth 2 times daily at 12 noon and 4 pm.   Yes Historical Provider, MD  simvastatin (ZOCOR) 40 MG tablet Take 40 mg by mouth daily.   Yes Historical Provider, MD  temazepam (RESTORIL) 15 MG capsule Take 15 mg by mouth at bedtime as needed for sleep.   Yes Historical Provider, MD  docusate (COLACE) 50 MG/5ML liquid Take 100 mg by mouth 2 (two) times daily.    Historical Provider, MD  doxycycline (ORACEA) 40 MG capsule Take 40 mg by mouth 2 (two) times daily.    Historical Provider, MD  pantoprazole (PROTONIX) 20 MG tablet Take 20 mg by mouth daily.    Historical Provider, MD    Anti-infectives    Start     Dose/Rate Route Frequency Ordered Stop   10/16/14 2000  ciprofloxacin (CIPRO) tablet 500 mg     500 mg Oral 2 times daily 10/16/14 1643     10/16/14 1645  metroNIDAZOLE (FLAGYL) tablet 500 mg     500 mg Oral 3 times per day 10/16/14 1643     10/15/14 1000  vancomycin (VANCOCIN) IVPB 1000 mg/200 mL premix     1,000 mg 200 mL/hr over 60 Minutes Intravenous Every 18 hours 10/14/14 1214     10/15/14 0230  vancomycin (VANCOCIN) IVPB 1000 mg/200 mL premix  Status:  Discontinued     1,000 mg 200 mL/hr over 60 Minutes Intravenous Every 18 hours 10/14/14 1026 10/14/14 1214   10/14/14 1215  vancomycin (VANCOCIN) IVPB 1000 mg/200 mL premix     1,000 mg 200 mL/hr over 60 Minutes Intravenous  Once 10/14/14 1214 10/14/14 1418   10/12/14 0900  cefTRIAXone (ROCEPHIN) 2 g in dextrose 5 % 50 mL IVPB  Status:  Discontinued     2 g 100 mL/hr over 30 Minutes Intravenous Every 24 hours 10/12/14 0817 10/13/14 0749   10/12/14 0815  cefTRIAXone (ROCEPHIN) 1 g in dextrose 5 % 50 mL IVPB  Status:  Discontinued     1 g 100 mL/hr over 30 Minutes Intravenous Every 24 hours 10/12/14 0806 10/12/14 0816   10/11/14 0830  vancomycin (VANCOCIN) IVPB 1000 mg/200 mL premix  Status:  Discontinued     1,000 mg 200 mL/hr over 60 Minutes Intravenous Every 24 hours 10/11/14 0811 10/14/14 1026   10/11/14 0730  vancomycin (VANCOCIN) IVPB 750 mg/150 ml premix  Status:  Discontinued    Comments:  Pharmacy to dose   750 mg 150 mL/hr over 60 Minutes Intravenous Every 12 hours 10/11/14 0722 10/11/14 0811      Scheduled Meds: . amiodarone  200 mg Oral Daily  . aspirin  81 mg Oral Daily  . cholecalciferol  1,000 Units Oral Daily  . ciprofloxacin  500 mg Oral BID  . docusate sodium  100 mg Oral BID  . enoxaparin (LOVENOX) injection  40 mg Subcutaneous Q24H  . ferrous sulfate  325 mg Oral Q breakfast  . gabapentin  300 mg Oral BID  . glipiZIDE  10 mg Oral QAC breakfast  . insulin aspart  0-5 Units Subcutaneous QHS  . insulin aspart  0-9 Units Subcutaneous TID WC  . insulin aspart  3 Units Subcutaneous TID WC  . levothyroxine  112 mcg Oral QAC breakfast  . lisinopril  5 mg Oral Daily  . metoprolol tartrate  25 mg Oral QHS  . metroNIDAZOLE  500 mg Oral 3 times per day  . multivitamin-lutein  1 capsule Oral Daily  . pantoprazole  40 mg Oral Daily  . simvastatin  40 mg Oral Daily  . sodium chloride  10 mL Intravenous Q12H  . vancomycin  1,000 mg Intravenous Q18H   Continuous Infusions:  PRN Meds:.acetaminophen **OR** acetaminophen, alum & mag hydroxide-simeth, HYDROcodone-acetaminophen, morphine injection, ondansetron (ZOFRAN) IV, sodium chloride, temazepam  Allergies  Allergen Reactions  . Codeine Other (See Comments)    unknown  . Cymbalta [Duloxetine Hcl] Other (See Comments)    Confusion, disorientation    Physical Exam  Vitals  Blood pressure 144/60, pulse 56, temperature 97.5 F (36.4 C), temperature source Oral, resp. rate 17, height '5\' 5"'$  (1.651 m), weight 58.514 kg (129 lb), SpO2 98 %.  Lower Extremity exam:  Vascular: DP PT pulses are +2 over 4 bilateral  Dermatological: Dressing is intact the wound has stayed stable and she is vastly improved with  her infection allergies checked her over the weekend for this. She is dressed at this moment in time. Cellulitis to her ankle or lower leg at this point.  Neurological: Significant neuropathy and loss of sensation to the both lower extremities.    Data Review  CBC  Recent Labs Lab 10/10/14 1143 10/11/14 0553 10/14/14 0946 10/15/14 0635  WBC 9.5 8.2 5.4 6.8  HGB 10.2* 10.3* 9.0* 8.6*  HCT 32.3* 33.3* 28.8* 27.1*  PLT 343 350 327 312  MCV 82.9 83.7 82.2 80.6  MCH 26.2 26.0 25.8* 25.7*  MCHC 31.7* 31.1* 31.4* 31.9*  RDW 16.7* 17.0* 16.9* 16.7*  LYMPHSABS  --   --  1.1 1.8  MONOABS  --   --  0.4 0.6  EOSABS  --   --  0.3 0.3  BASOSABS  --   --  0.2* 0.2*   ------------------------------------------------------------------------------------------------------------------  Chemistries   Recent Labs Lab 10/10/14 1143 10/11/14 0553 10/13/14 0556 10/14/14 0946 10/15/14 0635  NA 138 137  --   --  134*  K 3.9 4.1  --   --  4.0  CL 105 104  --   --  100*  CO2 26 25  --   --  26  GLUCOSE 123* 87  --   --  83  BUN 21* 21*  --   --  17  CREATININE 1.35* 1.13* 0.98 1.06* 1.04*  CALCIUM 8.8* 8.9  --   --  8.6*  AST  --   --   --   --  75*  ALT  --   --   --   --  59*  ALKPHOS  --   --   --   --  222*  BILITOT  --   --   --   --  0.1*   ------------------------------------------------------------------------------------------------------------------ estimated creatinine clearance is 39.5 mL/min (by C-G formula based on Cr of 1.04). ------------------------------------------------------------------------------------------------------------------ No results for input(s): TSH, T4TOTAL, T3FREE, THYROIDAB in the last 72 hours.  Invalid input(s): FREET3   Coagulation profile No results for input(s): INR, PROTIME in the last 168 hours. ------------------------------------------------------------------------------------------------------------------- No results for input(s):  DDIMER in the last 72 hours. -------------------------------------------------------------------------------------------------------------------  Cardiac Enzymes No results for input(s): CKMB, TROPONINI, MYOGLOBIN in the last 168 hours.  Invalid input(s): CK ------------------------------------------------------------------------------------------------------------------ Invalid input(s): POCBNP   ---------------------------------------------------------------------------------------------------------------  Urinalysis    Component Value Date/Time   COLORURINE Yellow 08/18/2013 0253   APPEARANCEUR Hazy 08/18/2013 0253   LABSPEC 1.012 08/18/2013 0253   PHURINE 5.0 08/18/2013 0253   GLUCOSEU >=500 08/18/2013 0253   HGBUR Negative 08/18/2013 0253   BILIRUBINUR Negative 08/18/2013 0253   KETONESUR Negative 08/18/2013 0253   PROTEINUR 100 mg/dL 08/18/2013 0253   NITRITE Negative 08/18/2013 0253   LEUKOCYTESUR Trace 08/18/2013 0253     Imaging results:   Dg Chest 1 View  10/15/2014   CLINICAL DATA:  Repositioned right PICC line.  EXAM: CHEST 1 VIEW  COMPARISON:  10/15/2014 and prior radiographs  FINDINGS: A right PICC line has been repositioned with tip overlying the mid SVC.  Upper limits normal heart size and mild peribronchial thickening again noted.  There is no evidence of focal airspace disease, pulmonary edema, suspicious pulmonary nodule/mass, pleural effusion, or pneumothorax.  No acute bony abnormalities are identified.  IMPRESSION: Right PICC line with tip overlying the mid SVC.   Electronically Signed   By: Margarette Canada M.D.   On: 10/15/2014 16:16   Dg Chest 1 View  10/15/2014   CLINICAL DATA:  PICC line placement. Congestive heart failure. Diabetes.  EXAM: CHEST 1 VIEW  COMPARISON:  08/22/2014  FINDINGS: Right-sided PICC line extends into the right-sided neck, its tip is not visualized. Midline trachea. Normal heart size. Atherosclerosis in the transverse aorta. No pleural  effusion or pneumothorax. Biapical pleural thickening. Interstitial thickening again identified. Interstitial and airspace opacities are significantly improved.  IMPRESSION: Right-sided PICC line extending into the right neck. Recommend repositioning. Critical test results telephoned to [employer], r.n. at the time of interpretation at . 2:44 p.m.on 10/15/2014.  Improved aeration. Mild interstitial thickening remains and could relate to the history of smoking/ chronic bronchitis or superimposed interstitial lung disease.  Aortic atherosclerosis.   Electronically Signed   By: Abigail Miyamoto M.D.   On: 10/15/2014 14:45   Assessment & Plan: Leave dressing intact for now scheduled to be changed tomorrow. Dr. Ola Spurr has seen appreciate his evaluating her and set her up with her antibiotics. She will be getting vancomycin intravenously and Flagyl or Cipro orally. I need to continue to get a dressing change every 3 days. She can ambulate to the bathroom and around her house but no excessive walking or standing. She must use a walker as well for stability. She must also always use the OrthoWedge shoe and use it appropriately to offload the first metatarsal area where the surgery  was performed. I would like to see her in my office next week for evaluation of the wound.   Specifically to physical therapy I recommend she is able to ambulate no more than 20 steps at a time, must use the OrthoWedge postop shoe appropriately, must use the walker for assistance and balance, and should only be standing or walking for no more than 5 minutes at a time.  Active Problems:   Diabetic foot infection (HCC)     Perry Mount M.D on 10/16/2014 at 5:39 PM

## 2014-10-16 NOTE — Progress Notes (Signed)
Infectious Disease Long Term IV Antibiotic Orders  Diagnosis: Diabetic foot infection, osteomyelitis  Culture results MRSA, Providencia, Fusobacterium  Allergies:  Allergies  Allergen Reactions  . Codeine Other (See Comments)    unknown  . Cymbalta [Duloxetine Hcl] Other (See Comments)    Confusion, disorientation    Discharge antibiotics Vancomycin        1000 mg  every        18     hours .     Goal vancomycin trough 15-20.    Pharmacy to adjust dosing based on levels  Ciprofloxacin 500 mg po bid Flagyl 500 mg po bid   PICC Care per protocol  Labs weekly while on IV antibiotics      CBC w diff   Comprehensive met panel Vancomycin Trough   CRP   Planned duration of antibiotics 2 weeks  Stop date 10/25/14 Tentative  Follow up clinic date TBD within 2 weeks FAX weekly labs to [phone]  Leonel Ramsay, MD

## 2014-10-16 NOTE — Progress Notes (Signed)
Dr. Sabra Heck was notified for BP of 80/60 . Will monitor and re-check  noon time.

## 2014-10-16 NOTE — Discharge Instructions (Signed)
Dr. Elvina [name] , podiatrist wants to see you next week. Please make this appointment .    Specifically to physical therapy Dr. Elvina [name] recommend she is able to ambulate no more than 20 steps at a time, must use the OrthoWedge postop shoe appropriately, must use the walker for assistance and balance, and should only be standing or walking for no more than 5 minutes at a time.

## 2014-10-16 NOTE — Clinical Social Work Placement (Signed)
   CLINICAL SOCIAL WORK PLACEMENT  NOTE  Date:  10/16/2014  Patient Details  Name: Sharon [name] MRN: [id number] Date of Birth: 07/29/1933  Clinical Social Work is seeking post-discharge placement for this patient at the [place] level of care (*CSW will initial, date and re-position this form in  chart as items are completed):  Yes   Patient/family provided with [facility] Work Department's list of facilities offering this level of care within the geographic area requested by the patient (or if unable, by the patient's family).  Yes   Patient/family informed of their freedom to choose among providers that offer the needed level of care, that participate in Medicare, Medicaid or managed care program needed by the patient, have an available bed and are willing to accept the patient.  Yes   Patient/family informed of [facility]'s ownership interest in [facility] and [facility], as well as of the fact that they are under no obligation to receive care at these facilities.  PASRR submitted to EDS on       PASRR number received on       Existing PASRR number confirmed on 10/16/14     FL2 transmitted to all facilities in geographic area requested by pt/family on 10/16/14     FL2 transmitted to all facilities within larger geographic area on       Patient informed that his/her managed care company has contracts with or will negotiate with certain facilities, including the following:        Yes   Patient/family informed of bed offers received.  Patient chooses bed at  [facility])     Physician recommends and patient chooses bed at      Patient to be transferred to   on  .  Patient to be transferred to facility by       Patient family notified on   of transfer.  Name of family member notified:        PHYSICIAN Please sign FL2     Additional Comment:    _______________________________________________ Loralyn Freshwater,  LCSW 10/16/2014, 1:49 PM

## 2014-10-16 NOTE — Progress Notes (Addendum)
Per MD patient will need 2 weeks of IV Abx. Per RN Case Manager patient lives alone and has nobody to assist with IV Abx at home. Clinical Social Worker (CSW) met with patient to discuss D/C plan. Patient is agreeable to SNF search. CSW started blue medicare authorization. Per Palma Holter Medicare case manager patient will be approved for SNF once a bed is chosen.      CSW presented bed offers. Patient chose [facility]. [facility] admissions coordinator at [facility] is aware of above. CSW left a voicemail with Wells Guiles at [facility] and made her aware that patient chose [facility]. CSW contacted patient's son [name] and made him aware of above. Per son he can provide transport tomorrow. MD is aware of above. RN Case Manager aware of above. CSW will continue to follow and assist as needed.   [facility] Medicare authorization has been received. Auth # [id number] RVA next review date 10/19/14. [facility] admissions coordinator at [facility] is aware of above.   Blima Rich, [place] [phone]

## 2014-10-16 NOTE — Progress Notes (Signed)
Initial Nutrition Assessment       INTERVENTION:  Meals and snacks: Cater to pt preferences   NUTRITION DIAGNOSIS:    (None at this time) related to   as evidenced by  .    GOAL:   Patient will meet greater than or equal to 90% of their needs    MONITOR:    (Energy intake)  REASON FOR ASSESSMENT:   LOS    ASSESSMENT:      Pt admitted with cellulitis/osteomyelitits, post surgical debridement  Past Medical History  Diagnosis Date  . CHF (congestive heart failure)   . Diabetes mellitus without complication   . Hyperlipidemia   . Atrial fibrillation   . Hypothyroid   . Hypertension   . Stroke   . Neuropathy   . Anemia   . Edema     feet/ankles occas    Current Nutrition: Pt reports eating 50% or more of meals during admission.  I and O sheet reflective of pt report  Food/Nutrition-Related History: Pt reports normal intake prior to admission   Medications: aspart, vit D, colace, fe sulfate, glipizide  Electrolyte/Renal Profile and Glucose Profile:   Recent Labs Lab 10/10/14 1143 10/11/14 0553 10/13/14 0556 10/14/14 0946 10/15/14 0635  NA 138 137  --   --  134*  K 3.9 4.1  --   --  4.0  CL 105 104  --   --  100*  CO2 26 25  --   --  26  BUN 21* 21*  --   --  17  CREATININE 1.35* 1.13* 0.98 1.06* 1.04*  CALCIUM 8.8* 8.9  --   --  8.6*  GLUCOSE 123* 87  --   --  83   Protein Profile:  Recent Labs Lab 10/15/14 0635  ALBUMIN 2.8*    Gastrointestinal Profile: Last BM: 10/2      Weight Change: 6% wt loss in the last year (not significant)    Diet Order:  Diet Carb Modified Fluid consistency:: Thin; Room service appropriate?: Yes  Skin:   reviewed      Height:   Ht Readings from Last 1 Encounters:  10/10/14 '5\' 5"'$  (1.651 m)    Weight:   Wt Readings from Last 1 Encounters:  10/10/14 129 lb (58.514 kg)    Ideal Body Weight:     BMI:  Body mass index is 21.47 kg/(m^2).  EDUCATION NEEDS:   No education needs  identified at this time   LOW Care Level  Ottie Tillery B. Zenia Resides, [place], [place] (pager)

## 2014-10-16 NOTE — Progress Notes (Signed)
Pt continued to be tearful throughout the evening in regards to her discharge home. Pt requested temazepam to help her sleep. Temazepam was administered as ordered at 2212.   Pt called nurse around 2330 and stated "when are you going to give me my temazepam". To room to discuss medication with pt. Informed pt that temazepam had been given at 2212. She said "well I usually feel sleepy by now but I am wide awake. I must have forgotten that you had given it to me since I was so upset about our conversation earlier." Encouraged pt to lay down in bed and to relax. Removed boots from both of the patient's feet. Attempted to assist pt to bed but she stated "no I am going to sit here and drink this water before bed". Will continue to monitor.

## 2014-10-16 NOTE — Progress Notes (Signed)
Patient ID: Sharon [name], female   DOB: 1933-12-21, 81 y.o.   MRN: [id number] Patient ID: Sharon [name], female   DOB: 16-Jul-1933, 81 y.o.   MRN: [id number] Patient ID: Sharon [name], female   DOB: Jun 10, 1933, 81 y.o.   MRN: [id number] Patient ID: Sharon [name], female   DOB: 11-20-1933, 81 y.o.   MRN: [id number] Patient ID: Sharon [name], female   DOB: 07-25-1933, 81 y.o.   MRN: [id number] Sharon [name] is a 81 y.o. female   SUBJECTIVE:  Patient admitted with cellulitis of the left lower leg with osteomyelitis of the ankle, no complaints this morning. Postop, mild pain, no complaints this morning  ______________________________________________________________________  ROS: Review of systems is unremarkable for any active cardiac,respiratory, GI, GU, hematologic, neurologic or psychiatric systems, 10 systems reviewed.  Marland Kitchen amiodarone  200 mg Oral Daily  . aspirin  81 mg Oral Daily  . cholecalciferol  1,000 Units Oral Daily  . docusate sodium  100 mg Oral BID  . enoxaparin (LOVENOX) injection  40 mg Subcutaneous Q24H  . ferrous sulfate  325 mg Oral Q breakfast  . gabapentin  300 mg Oral BID  . glipiZIDE  10 mg Oral QAC breakfast  . insulin aspart  0-5 Units Subcutaneous QHS  . insulin aspart  0-9 Units Subcutaneous TID WC  . insulin aspart  3 Units Subcutaneous TID WC  . levothyroxine  112 mcg Oral QAC breakfast  . lisinopril  5 mg Oral Daily  . metoprolol tartrate  25 mg Oral QHS  . multivitamin-lutein  1 capsule Oral Daily  . pantoprazole  40 mg Oral Daily  . simvastatin  40 mg Oral Daily  . sodium chloride  10 mL Intravenous Q12H  . vancomycin  1,000 mg Intravenous Q18H   acetaminophen **OR** acetaminophen, alum & mag hydroxide-simeth, morphine injection, ondansetron (ZOFRAN) IV, sodium chloride, temazepam   Past Medical History  Diagnosis Date  . CHF (congestive heart failure)   . Diabetes mellitus without complication   . Hyperlipidemia   . Atrial fibrillation   .  Hypothyroid   . Hypertension   . Stroke   . Neuropathy   . Anemia   . Edema     feet/ankles occas    Past Surgical History  Procedure Laterality Date  . Hemiarthroplasty hip Right   . Hemiarthroplasty hip Left   . Cholecystectomy    . Appendectomy    . Cardiac catheterization  08/25/13  . Coronary angioplasty    . Cataract extraction w/phaco Left 07/20/2014    Procedure: CATARACT EXTRACTION PHACO AND INTRAOCULAR LENS PLACEMENT (IOC);  Surgeon: Leandrew Koyanagi, MD;  Location: [facility] ORS;  Service: Ophthalmology;  Laterality: Left;  [place]  1:18                 AP     23.6             CDE   9.69      lot #[id number]  . Incision and drainage Left 10/11/2014    Procedure: Removal of infected tibial sessmoid;  Surgeon: Albertine Patricia, DPM;  Location: [facility] ORS;  Service: Podiatry;  Laterality: Left;    PHYSICAL EXAM:  BP 116/92 mmHg  Pulse 61  Temp(Src) 97.5 F (36.4 C) (Oral)  Resp 16  Ht '5\' 5"'$  (1.651 m)  Wt 58.514 kg (129 lb)  BMI 21.47 kg/m2  SpO2 100%  Wt Readings from Last 3 Encounters:  10/10/14 58.514 kg (129 lb)  10/10/14 58.514 kg (129  lb)  08/22/14 61.236 kg (135 lb)           BP Readings from Last 3 Encounters:  10/15/14 116/92  08/22/14 154/57  07/20/14 147/37    Constitutional: NAD Neck: supple, no thyromegaly Respiratory: CTA, no rales or wheezes Cardiovascular: IRR, no murmur, no gallop Abdomen: soft, good BS, nontender Extremities: no edema, skin erythema resolved, ulcer left ankle Neuro: alert , not oriented, no focal motor or sensory deficits  ASSESSMENT/PLAN:  Labs and imaging studies were reviewed  Ankle osteomyelitis/cellulitis-IV antibiotics, post surgical debridement, surgical care, MRSA, IV vancomycin Severe peripheral neuropathy-patient unable to feel her feet, gabapentin Diabetes mellitus-sliding scale with OHA, stable A. fib-rate controlled, continue medications History GI bleed-continue iron, follow hemoglobin Dementia-mild,  chronic Podiatry recommends home IV antibiotics, PICC line placed, IV vancomycin, ID consult, home Tuesday morning

## 2014-10-17 LAB — COMPREHENSIVE METABOLIC PANEL
ALT: 40 U/L (ref 14–54)
AST: 48 U/L — AB (ref 15–41)
Albumin: 3 g/dL — ABNORMAL LOW (ref 3.5–5.0)
Alkaline Phosphatase: 179 U/L — ABNORMAL HIGH (ref 38–126)
Anion gap: 3 — ABNORMAL LOW (ref 5–15)
BILIRUBIN TOTAL: 0.6 mg/dL (ref 0.3–1.2)
BUN: 22 mg/dL — AB (ref 6–20)
CO2: 28 mmol/L (ref 22–32)
CREATININE: 1.13 mg/dL — AB (ref 0.44–1.00)
Calcium: 8.4 mg/dL — ABNORMAL LOW (ref 8.9–10.3)
Chloride: 104 mmol/L (ref 101–111)
GFR, EST AFRICAN AMERICAN: 52 mL/min — AB (ref >60)
GFR, EST NON AFRICAN AMERICAN: 45 mL/min — AB (ref >60)
Glucose, Bld: 82 mg/dL (ref 65–99)
POTASSIUM: 4.1 mmol/L (ref 3.5–5.1)
Sodium: 135 mmol/L (ref 135–145)
TOTAL PROTEIN: 6.2 g/dL — AB (ref 6.5–8.1)

## 2014-10-17 LAB — CBC WITH DIFFERENTIAL/PLATELET
BASOS ABS: 0.2 10*3/uL — AB (ref 0–0.1)
Basophils Relative: 2 %
Eosinophils Absolute: 0.4 10*3/uL (ref 0–0.7)
Eosinophils Relative: 5 %
HEMATOCRIT: 28.8 % — AB (ref 35.0–47.0)
Hemoglobin: 9.1 g/dL — ABNORMAL LOW (ref 12.0–16.0)
LYMPHS ABS: 1.8 10*3/uL (ref 1.0–3.6)
LYMPHS PCT: 22 %
MCH: 25.7 pg — AB (ref 26.0–34.0)
MCHC: 31.6 g/dL — ABNORMAL LOW (ref 32.0–36.0)
MCV: 81.4 fL (ref 80.0–100.0)
MONO ABS: 0.7 10*3/uL (ref 0.2–0.9)
MONOS PCT: 9 %
NEUTROS ABS: 5 10*3/uL (ref 1.4–6.5)
Neutrophils Relative %: 62 %
Platelets: 331 10*3/uL (ref 150–440)
RBC: 3.54 MIL/uL — ABNORMAL LOW (ref 3.80–5.20)
RDW: 16.9 % — AB (ref 11.5–14.5)
WBC: 8 10*3/uL (ref 3.6–11.0)

## 2014-10-17 LAB — GLUCOSE, CAPILLARY: GLUCOSE-CAPILLARY: 114 mg/dL — AB (ref 65–99)

## 2014-10-17 LAB — C-REACTIVE PROTEIN: CRP: 1.4 mg/dL — AB (ref <1.0)

## 2014-10-17 NOTE — Clinical Social Work Note (Signed)
Patient discharged today to [facility]. Discharge summary sent and Florentina Jenny at [facility] is aware. CSW contacted patient's son: Mr. Bugge who stated that he felt more comfortable with EMS transport. CSW informed him that patient's isolation precautions and wound would be medical reasons for patient's insurance to [place] transport by EMS but no guarrantees. Mr. Toothaker went to get patient's belongings at her home and then came to hospital to see his mother prior to her transfer. Shela Leff MSW,LCSW [phone]

## 2014-10-17 NOTE — Progress Notes (Signed)
Alert and oriented. Vss. No signs of acute distress. Report given to Francisca December at [facility] . No signs of acute distress. Son notified of EMS transportation. No other issues observed.

## 2014-10-17 NOTE — Discharge Summary (Addendum)
                                                                                  Sharon [name], is a 81 y.o. female  DOB 10/18/1933  MRN [id number].  Admission date:  10/10/2014  Admitting Physician  Snehalatha Konidena, MD  Discharge Date:  10/17/2014    Admission Diagnosis  Osteomyelitis rt foot diabetic ulcer  infection  Discharge Diagnoses    Osteomyelitis of right ankle, MRSA, Providencia, Fusobacterium Diabetes mellitus, non-insulin-requiring Severe peripheral neuropathy Hypertension Chronic atrial fibrillation Hypothyroidism Chronic anemia    Past Medical History  Diagnosis Date  . CHF (congestive heart failure)   . Diabetes mellitus without complication   . Hyperlipidemia   . Atrial fibrillation   . Hypothyroid   . Hypertension   . Stroke   . Neuropathy   . Anemia   . Edema     feet/ankles occas    Past Surgical History  Procedure Laterality Date  . Hemiarthroplasty hip Right   . Hemiarthroplasty hip Left   . Cholecystectomy    . Appendectomy    . Cardiac catheterization  08/25/13  . Coronary angioplasty    . Cataract extraction w/phaco Left 07/20/2014    Procedure: CATARACT EXTRACTION PHACO AND INTRAOCULAR LENS PLACEMENT (IOC);  Surgeon: Chadwick Brasington, MD;  Location: [facility] ORS;  Service: Ophthalmology;  Laterality: Left;  US  1:18                 AP     23.6             CDE   9.69      lot #[id number]  . Incision and drainage Left 10/11/2014    Procedure: Removal of infected tibial sessmoid;  Surgeon: Matthew Troxler, DPM;  Location: [facility] ORS;  Service: Podiatry;  Laterality: Left;       History of present illness and  Hospital Course:     Kindly see H&P for history of present illness and admission details, please review complete Labs, Consult reports and Test reports for all details in brief  HPI  from the history and physical done on the day of admission    Hospital Course    Patient was admitted and the right ankle was debrided per  podiatry. Cultures grew MRSA, Providencia, Fusobacterium. MRI did confirm osteomyelitis of the ankle. Patient did well postoperatively and received 4 days of IV vancomycin for the MRSA, Cipro and Flagyl were added to cover the Fusobacterium and Providencia. Sugars overall were controlled.  The patient should continue antibiotics through 10/12. Patient should get weekly CBC, met C, Vanc trough, CRP and report these values to Dr. David Fitzgerald of infectious disease, [facility].  Dr. Troxler podiatry has outlined the activity restrictions in the discharge instructions. The dressing should be changed every 3 days per Dr. Troxler's direction.   Discharge Condition: Stable   Follow UP  Dr. Fitzgerald 2 weeks Dr. Troxler one week Dr. Miller 3 weeks    Discharge Instructions  and  Discharge Medications   Tylenol 650 mg every 6 when necessary pain Amiodarone 200 mg daily Aspirin 81 mg daily Vitamin D 1000 units daily   Ciprofloxacin 500 mg twice a day through 10/12 Colace 100 mg twice a day Ferrous sulfate 325 mg daily Gabapentin 600 mg twice a day Glipizide 10 mg every morning Hydrocodone 5/325 one tab every 6 when necessary pain Synthroid 112 g daily Lisinopril 5 mg daily Metoprolol tartrate 25 mg at bedtime Metronidazole 500 mg every 8 hours through 10/12 Pantoprazole 40 mg every morning Simvastatin 40 mg at bedtime Temazepam 15 mg at bedtime when necessary sleep Vancomycin 1 g q18 hours through 10/12  Today   Subjective:   Sharon [name] today is stable without complaint, ready for skilled nursing  Objective:   Blood pressure 141/69, pulse 61, temperature 98 F (36.7 C), temperature source Oral, resp. rate 18, height 5' 5" (1.651 m), weight 58.514 kg (129 lb), SpO2 99 %.   Exam Awake Alert, Oriented x 3, No new F.N deficits, Normal affect [place].AT,PERRAL Supple Neck,No JVD, No cervical lymphadenopathy appriciated.  Symmetrical Chest wall movement, Good air movement  bilaterally, CTAB RRR,No Gallops,Rubs or new Murmurs, Abd Soft, Non tender, No rebound. No Cyanosis, Clubbing or edema, No new Rash or bruise  Total Time in preparing paper work, data evaluation and todays exam - 35 minutes  MILLER,MARK F. M.D on 10/17/2014 at 7:58 AM         

## 2014-10-17 NOTE — Clinical Social Work Placement (Signed)
   CLINICAL SOCIAL WORK PLACEMENT  NOTE  Date:  10/17/2014  Patient Details  Name: Sharon [name] MRN: [id number] Date of Birth: 07-12-1933  Clinical Social Work is seeking post-discharge placement for this patient at the [place] level of care (*CSW will initial, date and re-position this form in  chart as items are completed):  Yes   Patient/family provided with [facility]'s list of facilities offering this level of care within the geographic area requested by the patient (or if unable, by the patient's family).  Yes   Patient/family informed of their freedom to choose among providers that offer the needed level of care, that participate in Medicare, Medicaid or managed care program needed by the patient, have an available bed and are willing to accept the patient.  Yes   Patient/family informed of [facility]'s ownership interest in [facility] and [facility], as well as of the fact that they are under no obligation to receive care at these facilities.  PASRR submitted to EDS on       PASRR number received on       Existing PASRR number confirmed on 10/16/14     FL2 transmitted to all facilities in geographic area requested by pt/family on 10/16/14     FL2 transmitted to all facilities within larger geographic area on       Patient informed that his/her managed care company has contracts with or will negotiate with certain facilities, including the following:        Yes   Patient/family informed of bed offers received.  Patient chooses bed at  [facility])     Physician recommends and patient chooses bed at  [facility])    Patient to be transferred to  [facility]) on 10/17/14.  Patient to be transferred to facility by  (EMS)     Patient family notified on 10/17/14 of transfer.  Name of family member notified:   (Son: Mr. Hartl)     PHYSICIAN Please sign FL2     Additional Comment:     _______________________________________________ Shela Leff, LCSW 10/17/2014, 3:43 PM

## 2014-10-18 ENCOUNTER — Encounter: Payer: Self-pay | Admitting: Internal Medicine

## 2014-10-18 ENCOUNTER — Non-Acute Institutional Stay (SKILLED_NURSING_FACILITY): Payer: Medicare Other | Admitting: Internal Medicine

## 2014-10-18 DIAGNOSIS — K219 Gastro-esophageal reflux disease without esophagitis: Secondary | ICD-10-CM

## 2014-10-18 DIAGNOSIS — E119 Type 2 diabetes mellitus without complications: Secondary | ICD-10-CM | POA: Diagnosis not present

## 2014-10-18 DIAGNOSIS — I5032 Chronic diastolic (congestive) heart failure: Secondary | ICD-10-CM | POA: Diagnosis not present

## 2014-10-18 DIAGNOSIS — E46 Unspecified protein-calorie malnutrition: Secondary | ICD-10-CM | POA: Diagnosis not present

## 2014-10-18 DIAGNOSIS — E1142 Type 2 diabetes mellitus with diabetic polyneuropathy: Secondary | ICD-10-CM

## 2014-10-18 DIAGNOSIS — M86172 Other acute osteomyelitis, left ankle and foot: Secondary | ICD-10-CM | POA: Diagnosis not present

## 2014-10-18 DIAGNOSIS — R5381 Other malaise: Secondary | ICD-10-CM | POA: Diagnosis not present

## 2014-10-18 DIAGNOSIS — I10 Essential (primary) hypertension: Secondary | ICD-10-CM | POA: Diagnosis not present

## 2014-10-18 DIAGNOSIS — I4891 Unspecified atrial fibrillation: Secondary | ICD-10-CM | POA: Diagnosis not present

## 2014-10-18 DIAGNOSIS — D500 Iron deficiency anemia secondary to blood loss (chronic): Secondary | ICD-10-CM

## 2014-10-18 NOTE — Progress Notes (Signed)
Patient ID: Sharon [name], female   DOB: 1933/10/22, 81 y.o.   MRN: [id number]      [name]   PCP: Rusty Aus., MD  Code Status: Full Code  Allergies  Allergen Reactions  . Codeine Other (See Comments)    Other reaction(s): Other (See Comments) Patient states she did not feel herself when she took it. unknown  . Cymbalta [Duloxetine Hcl] Other (See Comments)    Confusion, disorientation  . Duloxetine     Other reaction(s): Other (See Comments) Confusion, disorientation    Chief Complaint  Patient presents with  . New Admit To SNF    New Admit     HPI:  81 y.o. patient is here for short term rehabilitation post hospital admission from 10/10/14-10/17/14 with left foot diabetic ulcer and osteomyelitis. She had to undergo debridement. Her wound culture grew MRSA, fusobacterium and providencia and she is on iv vancomycin with po ciprofloxacin and flagyl until 10/25/14. She is seen in her room today. Her pain is under control with current regimen. She denies any concerns this visit.  Review of Systems:  Constitutional: Negative for fever, chills, diaphoresis.  HENT: Negative for headache, congestion, nasal discharge, difficulty swallowing.   Eyes: Negative for eye pain, blurred vision, double vision and discharge.  Respiratory: Negative for cough, shortness of breath and wheezing.   Cardiovascular: Negative for chest pain, palpitations, leg swelling.  Gastrointestinal: Negative for heartburn, nausea, vomiting, abdominal pain Genitourinary: Negative for dysuria, flank pain.  Musculoskeletal: Negative for back pain, falls Skin: Negative for itching, rash.  Neurological: Negative for dizziness, tingling, focal weakness Psychiatric/Behavioral: Negative for depression   Past Medical History  Diagnosis Date  . CHF (congestive heart failure) ([place])   . Diabetes mellitus without complication ([place])   . Hyperlipidemia   . Atrial fibrillation ([place])   . Hypothyroid    . Hypertension   . Stroke ([place])   . Neuropathy ([place])   . Anemia   . Edema     feet/ankles occas   Past Surgical History  Procedure Laterality Date  . Hemiarthroplasty hip Right   . Hemiarthroplasty hip Left   . Cholecystectomy    . Appendectomy    . Cardiac catheterization  08/25/13  . Coronary angioplasty    . Cataract extraction w/phaco Left 07/20/2014    Procedure: CATARACT EXTRACTION PHACO AND INTRAOCULAR LENS PLACEMENT (IOC);  Surgeon: Leandrew Koyanagi, MD;  Location: [facility] ORS;  Service: Ophthalmology;  Laterality: Left;  [place]  1:18                 AP     23.6             CDE   9.69      lot #[id number]  . Incision and drainage Left 10/11/2014    Procedure: Removal of infected tibial sessmoid;  Surgeon: Albertine Patricia, DPM;  Location: [facility] ORS;  Service: Podiatry;  Laterality: Left;   Social History:   reports that she quit smoking about 16 years ago. Her smoking use included Cigarettes. She has a 3.5 pack-year smoking history. She has never used smokeless tobacco. She reports that she does not drink alcohol or use illicit drugs.  Family History  Problem Relation Age of Onset  . Heart attack Mother     Medications:   Medication List       This list is accurate as of: 10/18/14  9:56 AM.  Always use your most recent med list.  acetaminophen 325 MG tablet  Commonly known as:  TYLENOL  Take 2 tablets (650 mg total) by mouth every 6 (six) hours as needed for mild pain (or Fever >/= 101).     amiodarone 200 MG tablet  Commonly known as:  PACERONE  Take 200 mg by mouth daily.     aspirin 81 MG tablet  Take 81 mg by mouth daily.     Cholecalciferol 1000 UNITS capsule  Take 1,000 Units by mouth daily.     ciprofloxacin 500 MG tablet  Commonly known as:  CIPRO  Take 1 tablet (500 mg total) by mouth 2 (two) times daily.     docusate sodium 100 MG capsule  Commonly known as:  COLACE  Take 1 capsule (100 mg total) by mouth 2 (two) times daily.      ferrous sulfate 325 (65 FE) MG tablet  Take 325 mg by mouth daily with breakfast.     gabapentin 300 MG capsule  Commonly known as:  NEURONTIN  Take 300 mg by mouth 2 (two) times daily.     glipiZIDE 10 MG tablet  Commonly known as:  GLUCOTROL  Take by mouth daily before breakfast.     HYDROcodone-acetaminophen 5-325 MG tablet  Commonly known as:  NORCO/VICODIN  Take 1 tablet by mouth every 6 (six) hours as needed for severe pain.     levothyroxine 112 MCG tablet  Commonly known as:  SYNTHROID, LEVOTHROID  Take 112 mcg by mouth daily before breakfast.     lisinopril 5 MG tablet  Commonly known as:  PRINIVIL,ZESTRIL  Take 5 mg by mouth daily.     metoprolol tartrate 25 MG tablet  Commonly known as:  LOPRESSOR  Take 25 mg by mouth at bedtime.     metroNIDAZOLE 500 MG tablet  Commonly known as:  FLAGYL  Take 1 tablet (500 mg total) by mouth every 8 (eight) hours.     pantoprazole 40 MG tablet  Commonly known as:  PROTONIX  Take 1 tablet (40 mg total) by mouth daily.     simvastatin 40 MG tablet  Commonly known as:  ZOCOR  Take 40 mg by mouth daily.     temazepam 15 MG capsule  Commonly known as:  RESTORIL  Take 15 mg by mouth at bedtime as needed for sleep.     vancomycin 1 GM/200ML Soln  Commonly known as:  VANCOCIN  Inject 200 mLs (1,000 mg total) into the vein every 18 (eighteen) hours.         Physical Exam: Filed Vitals:   10/18/14 0940  BP: 106/71  Pulse: 58  Temp: 97 F (36.1 C)  TempSrc: Oral  Resp: 20  Height: '5\' 5"'$  (1.651 m)  Weight: 129 lb (58.514 kg)  SpO2: 99%    General- elderly female, well built, in no acute distress Head- normocephalic, atraumatic Nose- normal nasal mucosa, no maxillary or frontal sinus tenderness, no nasal discharge Throat- moist mucus membrane Eyes- PERRLA, EOMI, no pallor, no icterus, no discharge, normal conjunctiva, normal sclera Neck- no cervical lymphadenopathy Cardiovascular- normal s1,s2, no murmurs,  palpable dorsalis pedis and radial pulses, 1+ left foot and leg edema Respiratory- bilateral clear to auscultation, no wheeze, no rhonchi, no crackles, no use of accessory muscles Abdomen- bowel sounds present, soft, non tender Musculoskeletal- able to move all 4 extremities, using a walker, unsteady gait and generalized weakness Neurological- no focal deficit, alert and oriented to person, place and time Skin- warm and dry, erythema to left  leg noted, left foot wound with sutures in place, no drainage Psychiatry- normal mood and affect    Labs reviewed: Basic Metabolic Panel:  Recent Labs  10/11/14 0553  10/14/14 0946 10/15/14 0635 10/17/14 0519  NA 137  --   --  134* 135  K 4.1  --   --  4.0 4.1  CL 104  --   --  100* 104  CO2 25  --   --  26 28  GLUCOSE 87  --   --  83 82  BUN 21*  --   --  17 22*  CREATININE 1.13*  < > 1.06* 1.04* 1.13*  CALCIUM 8.9  --   --  8.6* 8.4*  < > = values in this interval not displayed. Liver Function Tests:  Recent Labs  12/25/13 1526 10/15/14 0635 10/17/14 0519  AST 45* 75* 48*  ALT 28 59* 40  ALKPHOS 129* 222* 179*  BILITOT 0.4 0.1* 0.6  PROT 7.4 6.0* 6.2*  ALBUMIN 3.3* 2.8* 3.0*   No results for input(s): LIPASE, AMYLASE in the last 8760 hours. No results for input(s): AMMONIA in the last 8760 hours. CBC:  Recent Labs  10/14/14 0946 10/15/14 0635 10/17/14 0519  WBC 5.4 6.8 8.0  NEUTROABS 3.4 3.9 5.0  HGB 9.0* 8.6* 9.1*  HCT 28.8* 27.1* 28.8*  MCV 82.2 80.6 81.4  PLT 327 312 331   Cardiac Enzymes:  Recent Labs  12/25/13 1526 12/25/13 1932 12/25/13 2345  TROPONINI 0.03 0.10* 0.10*   BNP: Invalid input(s): POCBNP CBG:  Recent Labs  10/16/14 1608 10/16/14 2133 10/17/14 0749  GLUCAP 147* 109* 114*    Radiological Exams: Mr Foot Left W Wo Contrast  10/10/2014   CLINICAL DATA:  Full-thickness ulcer involving the plantar aspect of the forefoot located at the first metatarsal head area.  EXAM: MRI OF THE LEFT  FOREFOOT WITHOUT AND WITH CONTRAST  TECHNIQUE: Multiplanar, multisequence MR imaging was performed both before and after administration of intravenous contrast.  CONTRAST:  91m MULTIHANCE GADOBENATE DIMEGLUMINE 529 MG/ML IV SOLN  COMPARISON:  None.  FINDINGS: There is an open wound on the plantar aspect of the foot at the level of the first metatarsal head. MR findings consistent with septic arthritis involving the first metatarsal phalangeal joint and osteomyelitis involving the first metatarsal and first proximal phalanx. There is also osteomyelitis involving the medial sesamoiditis of the great toe. There is diffuse cellulitis and myositis.  There is a oblique coursing fracture of the medial malleolus at and above the level of the ankle mortise. This may be a subacute fracture. The fibula is intact. The tibiotalar joint is intact. The subtalar joints appear normal. The major ankle ligaments and tendons are intact.  IMPRESSION: Open wound on the plantar aspect of the forefoot at the level of the first metatarsal head. There is underlying septic arthritis and osteomyelitis as discussed above.  Diffuse cellulitis and myositis.  Nondisplaced fracture of the medial malleolus.   Electronically Signed   By: PMarijo SanesM.D.   On: 10/10/2014 15:35    Assessment/Plan  Physical deconditioning Will have her work with physical therapy and occupational therapy team to help with gait training and muscle strengthening exercises.fall precautions. Skin care. Encourage to be out of bed.   Left foot osteomyelitis S/p debridement. Continue ciprofloxacin 500 mg bid, flagyl 500 mg tid and vancomycin 1 gm q18 hour until 10/25/14. Weekly labs cbc, cmp, crp and vancomycin trough to be sent to ID clinic. Has  f/u with ID. monitor for temeprature and wbc curve. Continue wound care. Continue noroc 5-325 mg q6h prn pain. Keep legs elevated at rest  Blood loss anemia Post op and with current infection, monitor h&h. Continue feso4  325 mg daily  Protein calorie malnutrition Dietary consult to assess for protein supplement  afib Rate controlled. Continue amiodarone 200 mg daily, metoprolol 25 mg daily and baby aspirin  Diabetic neuropathy Continue gabapentin 300 mg bid for now  DM type 2 Monitor cbg, continue glipizide 10 mg daily with lisinopril and statin Lab Results  Component Value Date   HGBA1C 6.6* 02/14/2012   Hypothyroidism No results found for: TSH Check tsh, continue levothyroxine 112 mcg daily  gerd Continue protonix 40 mg daily and monitor  CHF Continue lisinopril 5 mg daily with lopressor 25 mg daily, monitor weight    Goals of care: short term rehabilitation   Labs/tests ordered: cbc, cmp, a1c, tSH  Family/ staff Communication: reviewed care plan with patient and nursing supervisor    Blanchie Serve, MD  [facility] [phone] (Monday-Friday 8 am - 5 pm) [phone] (afterhours)

## 2014-10-19 LAB — CBC AND DIFFERENTIAL
HEMATOCRIT: 30 % — AB (ref 36–46)
HEMOGLOBIN: 9.2 g/dL — AB (ref 12.0–16.0)
PLATELETS: 364 10*3/uL (ref 150–399)
WBC: 9.9 10*3/mL

## 2014-10-19 LAB — HEPATIC FUNCTION PANEL
ALT: 30 U/L (ref 7–35)
AST: 40 U/L — AB (ref 13–35)
Alkaline Phosphatase: 172 U/L — AB (ref 25–125)
BILIRUBIN, TOTAL: 0.3 mg/dL

## 2014-10-19 LAB — BASIC METABOLIC PANEL
GLUCOSE: 59 mg/dL
Potassium: 4.1 mmol/L (ref 3.4–5.3)
Sodium: 136 mmol/L — AB (ref 137–147)

## 2014-10-19 LAB — HEMOGLOBIN A1C: HEMOGLOBIN A1C: 6.3 % — AB (ref 4.0–6.0)

## 2014-10-19 LAB — TSH: TSH: 0.86 u[IU]/mL (ref <=5.90)

## 2014-10-21 ENCOUNTER — Other Ambulatory Visit
Admission: RE | Admit: 2014-10-21 | Discharge: 2014-10-21 | Disposition: A | Discharge status: Home / Self Care | Payer: Medicare Other | Source: Ambulatory Visit | Attending: Internal Medicine | Admitting: Internal Medicine

## 2014-10-21 DIAGNOSIS — M8618 Other acute osteomyelitis, other site: Secondary | ICD-10-CM | POA: Insufficient documentation

## 2014-10-21 DIAGNOSIS — K219 Gastro-esophageal reflux disease without esophagitis: Secondary | ICD-10-CM | POA: Insufficient documentation

## 2014-10-21 DIAGNOSIS — E119 Type 2 diabetes mellitus without complications: Secondary | ICD-10-CM | POA: Insufficient documentation

## 2014-10-21 DIAGNOSIS — Z4789 Encounter for other orthopedic aftercare: Secondary | ICD-10-CM | POA: Diagnosis not present

## 2014-10-21 DIAGNOSIS — I5032 Chronic diastolic (congestive) heart failure: Secondary | ICD-10-CM | POA: Insufficient documentation

## 2014-10-21 DIAGNOSIS — E039 Hypothyroidism, unspecified: Secondary | ICD-10-CM | POA: Diagnosis not present

## 2014-10-21 DIAGNOSIS — I482 Chronic atrial fibrillation: Secondary | ICD-10-CM | POA: Insufficient documentation

## 2014-10-21 DIAGNOSIS — G629 Polyneuropathy, unspecified: Secondary | ICD-10-CM | POA: Insufficient documentation

## 2014-10-21 DIAGNOSIS — E785 Hyperlipidemia, unspecified: Secondary | ICD-10-CM | POA: Insufficient documentation

## 2014-10-21 DIAGNOSIS — I10 Essential (primary) hypertension: Secondary | ICD-10-CM | POA: Insufficient documentation

## 2014-10-21 DIAGNOSIS — E46 Unspecified protein-calorie malnutrition: Secondary | ICD-10-CM | POA: Diagnosis not present

## 2014-10-21 DIAGNOSIS — D649 Anemia, unspecified: Secondary | ICD-10-CM | POA: Diagnosis not present

## 2014-10-21 LAB — CREATININE, SERUM
CREATININE: 1.25 mg/dL — AB (ref 0.44–1.00)
GFR calc Af Amer: 46 mL/min — ABNORMAL LOW (ref >60)
GFR, EST NON AFRICAN AMERICAN: 40 mL/min — AB (ref >60)

## 2014-10-21 LAB — VANCOMYCIN, TROUGH: Vancomycin Tr: 22 ug/mL (ref 10–20)

## 2014-10-21 LAB — BUN: BUN: 17 mg/dL (ref 6–20)

## 2014-10-21 LAB — BASIC METABOLIC PANEL
BUN: 17 mg/dL (ref 4–21)
Creatinine: 1.2 mg/dL — AB (ref <=1.1)

## 2014-10-25 ENCOUNTER — Non-Acute Institutional Stay (SKILLED_NURSING_FACILITY): Payer: Medicare Other | Admitting: Nurse Practitioner

## 2014-10-25 DIAGNOSIS — M86172 Other acute osteomyelitis, left ankle and foot: Secondary | ICD-10-CM | POA: Diagnosis not present

## 2014-10-25 DIAGNOSIS — E1142 Type 2 diabetes mellitus with diabetic polyneuropathy: Secondary | ICD-10-CM

## 2014-10-25 DIAGNOSIS — I10 Essential (primary) hypertension: Secondary | ICD-10-CM

## 2014-10-25 DIAGNOSIS — I5032 Chronic diastolic (congestive) heart failure: Secondary | ICD-10-CM

## 2014-10-25 DIAGNOSIS — D500 Iron deficiency anemia secondary to blood loss (chronic): Secondary | ICD-10-CM

## 2014-10-25 DIAGNOSIS — K219 Gastro-esophageal reflux disease without esophagitis: Secondary | ICD-10-CM

## 2014-10-25 NOTE — Progress Notes (Signed)
Patient ID: Sharon [name], female   DOB: [birth year], 81 y.o.   MRN: [id number]       Nursing Home Location:  [facility]  Full Code  Place of Service: SNF (31)  PCP: Rusty Aus., MD  Allergies  Allergen Reactions  . Codeine Other (See Comments)    Other reaction(s): Other (See Comments) Patient states she did not feel herself when she took it. unknown  . Cymbalta [Duloxetine Hcl] Other (See Comments)    Confusion, disorientation  . Duloxetine     Other reaction(s): Other (See Comments) Confusion, disorientation    Chief Complaint  Patient presents with  . Discharge Note    Discharge    HPI:  Patient is a 81 y.o. female seen today at [facility] for discharge home. Pt with a hx of CHF, DM, Hyperlipidemia, a fib, hyperthyroid, HTN, CVA. Pt is at [facility] for short term rehab post hospital admission from 10/10/14-10/17/14 with left foot diabetic ulcer and osteomyelitis. She had to undergo debridement. Her wound culture grew MRSA, fusobacterium and providencia and she is on iv vancomycin with po ciprofloxacin and flagyl which completes today. Being followed by ID clinic. WBC has been stable.  Wound is being followed by podiatry. Pain is under control on current regimen. Patient currently doing well with therapy, now stable to discharge home with home health.  Review of Systems:  Review of Systems  Constitutional: Negative for activity change, appetite change, fatigue and unexpected weight change.  HENT: Negative for congestion and hearing loss.   Eyes: Negative.   Respiratory: Negative for cough and shortness of breath.   Cardiovascular: Negative for chest pain, palpitations and leg swelling.  Gastrointestinal: Negative for abdominal pain, diarrhea and constipation.  Genitourinary: Negative for dysuria and difficulty urinating.  Musculoskeletal: Negative for myalgias and arthralgias.  Skin: Positive for wound (left great toe).  Negative for color change.  Neurological: Negative for dizziness and weakness.  Psychiatric/Behavioral: Negative for behavioral problems, confusion and agitation.    Past Medical History  Diagnosis Date  . CHF (congestive heart failure) ([place])   . Diabetes mellitus without complication ([place])   . Hyperlipidemia   . Atrial fibrillation ([place])   . Hypothyroid   . Hypertension   . Stroke ([place])   . Neuropathy ([place])   . Anemia   . Edema     feet/ankles occas   Past Surgical History  Procedure Laterality Date  . Hemiarthroplasty hip Right   . Hemiarthroplasty hip Left   . Cholecystectomy    . Appendectomy    . Cardiac catheterization  08/25/13  . Coronary angioplasty    . Cataract extraction w/phaco Left 07/20/2014    Procedure: CATARACT EXTRACTION PHACO AND INTRAOCULAR LENS PLACEMENT (IOC);  Surgeon: Leandrew Koyanagi, MD;  Location: [facility] ORS;  Service: Ophthalmology;  Laterality: Left;  [place]  1:18                 AP     23.6             CDE   9.69      lot #[id number]  . Incision and drainage Left 10/11/2014    Procedure: Removal of infected tibial sessmoid;  Surgeon: Albertine Patricia, DPM;  Location: [facility] ORS;  Service: Podiatry;  Laterality: Left;   Social History:   reports that she quit smoking about 16 years ago. Her smoking use included Cigarettes. She has a 3.5 pack-year smoking history. She has never  used smokeless tobacco. She reports that she does not drink alcohol or use illicit drugs.  Family History  Problem Relation Age of Onset  . Heart attack Mother     Medications: Patient's Medications  New Prescriptions   No medications on file  Previous Medications   ACETAMINOPHEN (TYLENOL) 325 MG TABLET    Take 2 tablets (650 mg total) by mouth every 6 (six) hours as needed for mild pain (or Fever >/= 101).   AMIODARONE (PACERONE) 200 MG TABLET    Take one tablet by mouth once daily for AFIB   ASPIRIN 81 MG TABLET    Take 81 mg by mouth daily.   CHOLECALCIFEROL 1000 UNITS  CAPSULE    Take 1,000 Units by mouth daily.   CIPROFLOXACIN (CIPRO) 500 MG TABLET    Take 1 tablet (500 mg total) by mouth 2 (two) times daily.   DOCUSATE SODIUM (COLACE) 100 MG CAPSULE    Take 1 capsule (100 mg total) by mouth 2 (two) times daily.   FERROUS SULFATE 325 (65 FE) MG TABLET    Take 325 mg by mouth daily with breakfast.   GABAPENTIN (NEURONTIN) 600 MG TABLET    Take one tablet by mouth twice daily for neuropathy   GLIPIZIDE (GLUCOTROL) 10 MG TABLET    Take one tablet by mouth every morning for diabetes   HYDROCODONE-ACETAMINOPHEN (NORCO/VICODIN) 5-325 MG TABLET    Take 1 tablet by mouth every 6 (six) hours as needed for severe pain.   LEVOTHYROXINE (SYNTHROID, LEVOTHROID) 112 MCG TABLET    Take one tablet by mouth once daily 30 minutes before breakfast once daily for hypothyroid   LISINOPRIL (PRINIVIL,ZESTRIL) 5 MG TABLET    Take one tablet by mouth once daily for Hypertension   METOPROLOL TARTRATE (LOPRESSOR) 25 MG TABLET    Take one tablet by mouth every day at bedtime for hypertension   METRONIDAZOLE (FLAGYL) 500 MG TABLET    Take 1 tablet (500 mg total) by mouth every 8 (eight) hours.   PANTOPRAZOLE (PROTONIX) 40 MG TABLET    Take 1 tablet (40 mg total) by mouth daily.   SIMVASTATIN (ZOCOR) 40 MG TABLET    Take one tablet by mouth once daily at bedtime for cholesterol   TEMAZEPAM (RESTORIL) 15 MG CAPSULE    Take 15 mg by mouth at bedtime as needed for sleep.   VANCOMYCIN (VANCOCIN) 1 GM/200ML SOLN    Inject 200 mLs (1,000 mg total) into the vein every 18 (eighteen) hours.  Modified Medications   No medications on file  Discontinued Medications   GABAPENTIN (NEURONTIN) 300 MG CAPSULE    Take 300 mg by mouth 2 (two) times daily.     Physical Exam: Filed Vitals:   10/25/14 1048  BP: 119/74  Pulse: 77  Temp: 98 F (36.7 C)  TempSrc: Oral  Resp: 18  Height: '5\' 5"'$  (1.651 m)  Weight: 127 lb (57.607 kg)  SpO2: 96%    Physical Exam  Constitutional: She is oriented to  person, place, and time. She appears well-developed and well-nourished. No distress.  HENT:  Head: Normocephalic and atraumatic.  Mouth/Throat: Oropharynx is clear and moist. No oropharyngeal exudate.  Eyes: Conjunctivae are normal. Pupils are equal, round, and reactive to light.  Neck: Normal range of motion. Neck supple.  Cardiovascular: Normal rate, regular rhythm and normal heart sounds.   Pulmonary/Chest: Effort normal and breath sounds normal.  Abdominal: Soft. Bowel sounds are normal.  Musculoskeletal: She exhibits no edema or tenderness.  Neurological: She is alert and oriented to person, place, and time.  Skin: Skin is warm and dry. She is not diaphoretic.  Psychiatric: She has a normal mood and affect.    Labs reviewed: Basic Metabolic Panel:  Recent Labs  10/11/14 0553  10/15/14 0635 10/17/14 0519 10/19/14 10/21/14 10/21/14 1630  NA 137  --  134* 135 136*  --   --   K 4.1  --  4.0 4.1 4.1  --   --   CL 104  --  100* 104  --   --   --   CO2 25  --  26 28  --   --   --   GLUCOSE 87  --  83 82  --   --   --   BUN 21*  --  17 22*  --  17 17  CREATININE 1.13*  < > 1.04* 1.13*  --  1.2* 1.25*  CALCIUM 8.9  --  8.6* 8.4*  --   --   --   < > = values in this interval not displayed. Liver Function Tests:  Recent Labs  12/25/13 1526 10/15/14 0635 10/17/14 0519 10/19/14  AST 45* 75* 48* 40*  ALT 28 59* 40 30  ALKPHOS 129* 222* 179* 172*  BILITOT 0.4 0.1* 0.6  --   PROT 7.4 6.0* 6.2*  --   ALBUMIN 3.3* 2.8* 3.0*  --    No results for input(s): LIPASE, AMYLASE in the last 8760 hours. No results for input(s): AMMONIA in the last 8760 hours. CBC:  Recent Labs  10/14/14 0946 10/15/14 0635 10/17/14 0519 10/19/14  WBC 5.4 6.8 8.0 9.9  NEUTROABS 3.4 3.9 5.0  --   HGB 9.0* 8.6* 9.1* 9.2*  HCT 28.8* 27.1* 28.8* 30*  MCV 82.2 80.6 81.4  --   PLT 327 312 331 364   TSH:  Recent Labs  10/19/14  TSH 0.86   A1C: Lab Results  Component Value Date   HGBA1C 6.3*  10/19/2014   Lipid Panel:  Recent Labs  12/26/13 0613  CHOL 133  HDL 36*  LDLCALC 28  TRIG 345*    Radiological Exams: Dg Chest 1 View  10/15/2014  CLINICAL DATA:  Repositioned right PICC line. EXAM: CHEST 1 VIEW COMPARISON:  10/15/2014 and prior radiographs FINDINGS: A right PICC line has been repositioned with tip overlying the mid SVC. Upper limits normal heart size and mild peribronchial thickening again noted. There is no evidence of focal airspace disease, pulmonary edema, suspicious pulmonary nodule/mass, pleural effusion, or pneumothorax. No acute bony abnormalities are identified. IMPRESSION: Right PICC line with tip overlying the mid SVC. Electronically Signed   By: Margarette Canada M.D.   On: 10/15/2014 16:16   Dg Chest 1 View  10/15/2014  CLINICAL DATA:  PICC line placement. Congestive heart failure. Diabetes. EXAM: CHEST 1 VIEW COMPARISON:  08/22/2014 FINDINGS: Right-sided PICC line extends into the right-sided neck, its tip is not visualized. Midline trachea. Normal heart size. Atherosclerosis in the transverse aorta. No pleural effusion or pneumothorax. Biapical pleural thickening. Interstitial thickening again identified. Interstitial and airspace opacities are significantly improved. IMPRESSION: Right-sided PICC line extending into the right neck. Recommend repositioning. Critical test results telephoned to [place], r.n. at the time of interpretation at . 2:44 p.m.on 10/15/2014. Improved aeration. Mild interstitial thickening remains and could relate to the history of smoking/ chronic bronchitis or superimposed interstitial lung disease. Aortic atherosclerosis. Electronically Signed   By: Abigail Miyamoto M.D.   On:  10/15/2014 14:45   Mr Foot Left W Wo Contrast  10/10/2014  CLINICAL DATA:  Full-thickness ulcer involving the plantar aspect of the forefoot located at the first metatarsal head area. EXAM: MRI OF THE LEFT FOREFOOT WITHOUT AND WITH CONTRAST TECHNIQUE: Multiplanar,  multisequence MR imaging was performed both before and after administration of intravenous contrast. CONTRAST:  61m MULTIHANCE GADOBENATE DIMEGLUMINE 529 MG/ML IV SOLN COMPARISON:  None. FINDINGS: There is an open wound on the plantar aspect of the foot at the level of the first metatarsal head. MR findings consistent with septic arthritis involving the first metatarsal phalangeal joint and osteomyelitis involving the first metatarsal and first proximal phalanx. There is also osteomyelitis involving the medial sesamoiditis of the great toe. There is diffuse cellulitis and myositis. There is a oblique coursing fracture of the medial malleolus at and above the level of the ankle mortise. This may be a subacute fracture. The fibula is intact. The tibiotalar joint is intact. The subtalar joints appear normal. The major ankle ligaments and tendons are intact. IMPRESSION: Open wound on the plantar aspect of the forefoot at the level of the first metatarsal head. There is underlying septic arthritis and osteomyelitis as discussed above. Diffuse cellulitis and myositis. Nondisplaced fracture of the medial malleolus. Electronically Signed   By: PMarijo SanesM.D.   On: 10/10/2014 15:35   [place]ous Img Lower Unilateral Left  09/29/2014  CLINICAL DATA:  Left lower extremity swelling for the past 2 days. Evaluate for DVT. EXAM: LEFT LOWER EXTREMITY VENOUS DOPPLER ULTRASOUND TECHNIQUE: Gray-scale sonography with graded compression, as well as color Doppler and duplex ultrasound were performed to evaluate the lower extremity deep venous systems from the level of the common femoral vein and including the common femoral, femoral, profunda femoral, popliteal and calf veins including the posterior tibial, peroneal and gastrocnemius veins when visible. The superficial great saphenous vein was also interrogated. Spectral Doppler was utilized to evaluate flow at rest and with distal augmentation maneuvers in the common femoral,  femoral and popliteal veins. COMPARISON:  Bilateral lower extremity venous Doppler ultrasound - 05/26/2012 FINDINGS: Contralateral Common Femoral Vein: Respiratory phasicity is normal and symmetric with the symptomatic side. No evidence of thrombus. Normal compressibility. Common Femoral Vein: No evidence of thrombus. Normal compressibility, respiratory phasicity and response to augmentation. Saphenofemoral Junction: No evidence of thrombus. Normal compressibility and flow on color Doppler imaging. Profunda Femoral Vein: No evidence of thrombus. Normal compressibility and flow on color Doppler imaging. Femoral Vein: No evidence of thrombus. Normal compressibility, respiratory phasicity and response to augmentation. Popliteal Vein: No evidence of thrombus. Normal compressibility, respiratory phasicity and response to augmentation. Calf Veins: No evidence of thrombus. Normal compressibility and flow on color Doppler imaging. Superficial Great Saphenous Vein: No evidence of thrombus. Normal compressibility and flow on color Doppler imaging. Venous Reflux:  None. Other Findings: A minimal amount of subcutaneous edema is noted at the level of the left lower leg (images 17 and 18). IMPRESSION: No evidence DVT within the left lower extremity. Electronically Signed   By: JSandi MariscalM.D.   On: 09/29/2014 12:05     Assessment/Plan 1. Acute osteomyelitis of left foot ([place]/p debridement. Finishes ciprofloxacin 500 mg bid, flagyl 500 mg tid and vancomycin 1 gm today.  Weekly labs cbc have been stable, WBC count in normal range without temp. Has f/u with ID clinic, will have staff call to confirm okay to dc PICC prior to DC. [facility]  2. Blood loss anemia Stable, conts on iron daily  3. Chronic diastolic heart failure (HCC) Stable, on lisinopril 5 mg daily with lopressor 25 mg daily  4. Essential hypertension Blood pressure stable, conts on lopressor and lisinopril.  5.  Diabetic polyneuropathy associated with type 2 diabetes mellitus (HCC) Recent A1c of 6.3, conts on glipizide and gabapentin 300 mg BID for neuropathy.   6. Gastroesophageal reflux disease, esophagitis presence not specified Stable, conts on protonix 40 mg daily.  pt is stable for discharge-will need PT/OT/Nursing per home health. No DME needed. Rx written.  will need to follow up with PCP within 2 weeks. Also needs follow up with podiatry and ID, appts are scheduled.    Carlos American. Harle Battiest  [facility] [phone] 8 am - 5 pm) [phone] (after hours)

## 2014-11-05 ENCOUNTER — Emergency Department: Payer: Medicare Other

## 2014-11-05 ENCOUNTER — Encounter: Payer: Self-pay | Admitting: *Deleted

## 2014-11-05 ENCOUNTER — Inpatient Hospital Stay
Admission: EM | Admit: 2014-11-05 | Discharge: 2014-11-09 | DRG: 535 | Disposition: A | Discharge status: Skilled Nursing Facility | Payer: Medicare Other | Attending: Internal Medicine | Admitting: Internal Medicine

## 2014-11-05 DIAGNOSIS — K219 Gastro-esophageal reflux disease without esophagitis: Secondary | ICD-10-CM | POA: Diagnosis present

## 2014-11-05 DIAGNOSIS — N289 Disorder of kidney and ureter, unspecified: Secondary | ICD-10-CM | POA: Diagnosis present

## 2014-11-05 DIAGNOSIS — Z886 Allergy status to analgesic agent status: Secondary | ICD-10-CM | POA: Diagnosis not present

## 2014-11-05 DIAGNOSIS — Z9861 Coronary angioplasty status: Secondary | ICD-10-CM

## 2014-11-05 DIAGNOSIS — I482 Chronic atrial fibrillation: Secondary | ICD-10-CM | POA: Diagnosis present

## 2014-11-05 DIAGNOSIS — Z9049 Acquired absence of other specified parts of digestive tract: Secondary | ICD-10-CM

## 2014-11-05 DIAGNOSIS — Z7982 Long term (current) use of aspirin: Secondary | ICD-10-CM | POA: Diagnosis not present

## 2014-11-05 DIAGNOSIS — M25552 Pain in left hip: Secondary | ICD-10-CM

## 2014-11-05 DIAGNOSIS — R001 Bradycardia, unspecified: Secondary | ICD-10-CM | POA: Diagnosis present

## 2014-11-05 DIAGNOSIS — Z9842 Cataract extraction status, left eye: Secondary | ICD-10-CM | POA: Diagnosis not present

## 2014-11-05 DIAGNOSIS — Z961 Presence of intraocular lens: Secondary | ICD-10-CM | POA: Diagnosis present

## 2014-11-05 DIAGNOSIS — Z87891 Personal history of nicotine dependence: Secondary | ICD-10-CM | POA: Diagnosis not present

## 2014-11-05 DIAGNOSIS — E875 Hyperkalemia: Secondary | ICD-10-CM | POA: Diagnosis present

## 2014-11-05 DIAGNOSIS — E039 Hypothyroidism, unspecified: Secondary | ICD-10-CM | POA: Diagnosis present

## 2014-11-05 DIAGNOSIS — Z9889 Other specified postprocedural states: Secondary | ICD-10-CM

## 2014-11-05 DIAGNOSIS — S32592A Other specified fracture of left pubis, initial encounter for closed fracture: Secondary | ICD-10-CM | POA: Diagnosis present

## 2014-11-05 DIAGNOSIS — Z8673 Personal history of transient ischemic attack (TIA), and cerebral infarction without residual deficits: Secondary | ICD-10-CM

## 2014-11-05 DIAGNOSIS — N179 Acute kidney failure, unspecified: Secondary | ICD-10-CM | POA: Diagnosis present

## 2014-11-05 DIAGNOSIS — Z79899 Other long term (current) drug therapy: Secondary | ICD-10-CM | POA: Diagnosis not present

## 2014-11-05 DIAGNOSIS — I5032 Chronic diastolic (congestive) heart failure: Secondary | ICD-10-CM | POA: Diagnosis present

## 2014-11-05 DIAGNOSIS — E1142 Type 2 diabetes mellitus with diabetic polyneuropathy: Secondary | ICD-10-CM | POA: Diagnosis present

## 2014-11-05 DIAGNOSIS — N170 Acute kidney failure with tubular necrosis: Secondary | ICD-10-CM | POA: Diagnosis present

## 2014-11-05 DIAGNOSIS — W19XXXA Unspecified fall, initial encounter: Secondary | ICD-10-CM | POA: Diagnosis present

## 2014-11-05 DIAGNOSIS — E785 Hyperlipidemia, unspecified: Secondary | ICD-10-CM | POA: Diagnosis present

## 2014-11-05 DIAGNOSIS — N189 Chronic kidney disease, unspecified: Secondary | ICD-10-CM

## 2014-11-05 DIAGNOSIS — Z888 Allergy status to other drugs, medicaments and biological substances status: Secondary | ICD-10-CM

## 2014-11-05 DIAGNOSIS — Z8249 Family history of ischemic heart disease and other diseases of the circulatory system: Secondary | ICD-10-CM

## 2014-11-05 DIAGNOSIS — M869 Osteomyelitis, unspecified: Secondary | ICD-10-CM | POA: Diagnosis present

## 2014-11-05 DIAGNOSIS — Z96643 Presence of artificial hip joint, bilateral: Secondary | ICD-10-CM | POA: Diagnosis present

## 2014-11-05 DIAGNOSIS — I110 Hypertensive heart disease with heart failure: Secondary | ICD-10-CM | POA: Diagnosis present

## 2014-11-05 HISTORY — DX: Osteomyelitis, unspecified: M86.9

## 2014-11-05 LAB — HEMOGLOBIN A1C: Hgb A1c MFr Bld: 5.9 % (ref 4.0–6.0)

## 2014-11-05 LAB — GLUCOSE, CAPILLARY
GLUCOSE-CAPILLARY: 172 mg/dL — AB (ref 65–99)
Glucose-Capillary: 81 mg/dL (ref 65–99)

## 2014-11-05 LAB — URINALYSIS COMPLETE WITH MICROSCOPIC (ARMC ONLY)
BILIRUBIN URINE: NEGATIVE
Glucose, UA: NEGATIVE mg/dL
Hgb urine dipstick: NEGATIVE
Ketones, ur: NEGATIVE mg/dL
Nitrite: NEGATIVE
PH: 5 (ref 5.0–8.0)
PROTEIN: NEGATIVE mg/dL
Specific Gravity, Urine: 1.013 (ref 1.005–1.030)
TRANS EPITHEL UA: 3

## 2014-11-05 LAB — BASIC METABOLIC PANEL
Anion gap: 7 (ref 5–15)
Anion gap: 8 (ref 5–15)
BUN: 36 mg/dL — AB (ref 6–20)
BUN: 32 mg/dL — ABNORMAL HIGH (ref 6–20)
CHLORIDE: 104 mmol/L (ref 101–111)
CHLORIDE: 106 mmol/L (ref 101–111)
CO2: 20 mmol/L — AB (ref 22–32)
CO2: 19 mmol/L — AB (ref 22–32)
CREATININE: 2.19 mg/dL — AB (ref 0.44–1.00)
Calcium: 8.7 mg/dL — ABNORMAL LOW (ref 8.9–10.3)
Calcium: 7.8 mg/dL — ABNORMAL LOW (ref 8.9–10.3)
Creatinine, Ser: 2.35 mg/dL — ABNORMAL HIGH (ref 0.44–1.00)
GFR calc Af Amer: 22 mL/min — ABNORMAL LOW (ref >60)
GFR calc non Af Amer: 19 mL/min — ABNORMAL LOW (ref >60)
GFR calc non Af Amer: 20 mL/min — ABNORMAL LOW (ref >60)
GFR, EST AFRICAN AMERICAN: 23 mL/min — AB (ref >60)
GLUCOSE: 199 mg/dL — AB (ref 65–99)
GLUCOSE: 79 mg/dL (ref 65–99)
POTASSIUM: 6.4 mmol/L — AB (ref 3.5–5.1)
Potassium: 6 mmol/L — ABNORMAL HIGH (ref 3.5–5.1)
Sodium: 131 mmol/L — ABNORMAL LOW (ref 135–145)
Sodium: 133 mmol/L — ABNORMAL LOW (ref 135–145)

## 2014-11-05 LAB — CBC WITH DIFFERENTIAL/PLATELET
Basophils Absolute: 0.2 10*3/uL — ABNORMAL HIGH (ref 0–0.1)
Basophils Relative: 2 %
EOS PCT: 3 %
Eosinophils Absolute: 0.4 10*3/uL (ref 0–0.7)
HCT: 33 % — ABNORMAL LOW (ref 35.0–47.0)
Hemoglobin: 10.1 g/dL — ABNORMAL LOW (ref 12.0–16.0)
LYMPHS ABS: 1.1 10*3/uL (ref 1.0–3.6)
LYMPHS PCT: 9 %
MCH: 25.6 pg — AB (ref 26.0–34.0)
MCHC: 30.7 g/dL — ABNORMAL LOW (ref 32.0–36.0)
MCV: 83.2 fL (ref 80.0–100.0)
MONO ABS: 0.7 10*3/uL (ref 0.2–0.9)
MONOS PCT: 6 %
Neutro Abs: 10.3 10*3/uL — ABNORMAL HIGH (ref 1.4–6.5)
Neutrophils Relative %: 80 %
PLATELETS: 244 10*3/uL (ref 150–440)
RBC: 3.97 MIL/uL (ref 3.80–5.20)
RDW: 18.3 % — AB (ref 11.5–14.5)
WBC: 12.7 10*3/uL — ABNORMAL HIGH (ref 3.6–11.0)

## 2014-11-05 LAB — MRSA PCR SCREENING: MRSA BY PCR: NEGATIVE

## 2014-11-05 MED ORDER — FERROUS SULFATE 325 (65 FE) MG PO TABS
325.0000 mg | ORAL_TABLET | Freq: Every day | ORAL | Status: DC
  Administered 2014-11-06 – 2014-11-09 (×4): 325 mg via ORAL
  Filled 2014-11-05 (×4): qty 1

## 2014-11-05 MED ORDER — ACETAMINOPHEN 500 MG PO TABS
500.0000 mg | ORAL_TABLET | Freq: Four times a day (QID) | ORAL | Status: DC | PRN
  Administered 2014-11-08: 500 mg via ORAL
  Filled 2014-11-05: qty 1

## 2014-11-05 MED ORDER — INSULIN ASPART 100 UNIT/ML ~~LOC~~ SOLN: 0.0000 [IU] | Freq: Every day | SUBCUTANEOUS | Status: DC

## 2014-11-05 MED ORDER — INSULIN ASPART 100 UNIT/ML ~~LOC~~ SOLN
0.0000 [IU] | Freq: Three times a day (TID) | SUBCUTANEOUS | Status: DC
  Administered 2014-11-06: 1 [IU] via SUBCUTANEOUS
  Administered 2014-11-06: 2 [IU] via SUBCUTANEOUS
  Administered 2014-11-07 – 2014-11-08 (×5): 1 [IU] via SUBCUTANEOUS
  Filled 2014-11-05 (×3): qty 1
  Filled 2014-11-05: qty 10
  Filled 2014-11-05: qty 1
  Filled 2014-11-05: qty 2
  Filled 2014-11-05 (×2): qty 1

## 2014-11-05 MED ORDER — HYDROCODONE-ACETAMINOPHEN 5-325 MG PO TABS
ORAL_TABLET | ORAL | Status: AC
  Administered 2014-11-05: 2 via ORAL
  Filled 2014-11-05: qty 2

## 2014-11-05 MED ORDER — ONDANSETRON HCL 4 MG/2ML IJ SOLN
4.0000 mg | Freq: Four times a day (QID) | INTRAMUSCULAR | Status: DC | PRN
  Administered 2014-11-08: 4 mg via INTRAVENOUS
  Filled 2014-11-05: qty 2

## 2014-11-05 MED ORDER — SODIUM CHLORIDE 0.9 % IJ SOLN
3.0000 mL | Freq: Two times a day (BID) | INTRAMUSCULAR | Status: DC
  Administered 2014-11-07 – 2014-11-09 (×5): 3 mL via INTRAVENOUS

## 2014-11-05 MED ORDER — VANCOMYCIN HCL IN DEXTROSE 1-5 GM/200ML-% IV SOLN
1000.0000 mg | INTRAVENOUS | Status: DC
  Administered 2014-11-06: 1000 mg via INTRAVENOUS
  Filled 2014-11-05: qty 200

## 2014-11-05 MED ORDER — SODIUM CHLORIDE 0.9 % IV BOLUS (SEPSIS)
1000.0000 mL | Freq: Once | INTRAVENOUS | Status: AC
  Administered 2014-11-05: 1000 mL via INTRAVENOUS

## 2014-11-05 MED ORDER — DEXTROSE 50 % IV SOLN
25.0000 mL | Freq: Once | INTRAVENOUS | Status: AC
  Administered 2014-11-05: 25 mL via INTRAVENOUS
  Filled 2014-11-05: qty 50

## 2014-11-05 MED ORDER — ONDANSETRON HCL 4 MG PO TABS: 4.0000 mg | ORAL_TABLET | Freq: Four times a day (QID) | ORAL | Status: DC | PRN

## 2014-11-05 MED ORDER — OCUVITE-LUTEIN PO CAPS
1.0000 | ORAL_CAPSULE | Freq: Every day | ORAL | Status: DC
  Administered 2014-11-06 – 2014-11-09 (×4): 1 via ORAL
  Filled 2014-11-05 (×4): qty 1

## 2014-11-05 MED ORDER — ASPIRIN EC 81 MG PO TBEC
81.0000 mg | DELAYED_RELEASE_TABLET | Freq: Every day | ORAL | Status: DC
  Administered 2014-11-06 – 2014-11-09 (×4): 81 mg via ORAL
  Filled 2014-11-05 (×4): qty 1

## 2014-11-05 MED ORDER — SODIUM POLYSTYRENE SULFONATE 15 GM/60ML PO SUSP
30.0000 g | Freq: Once | ORAL | Status: AC
  Administered 2014-11-05: 30 g via ORAL
  Filled 2014-11-05: qty 120

## 2014-11-05 MED ORDER — GLIPIZIDE ER 2.5 MG PO TB24
2.5000 mg | ORAL_TABLET | Freq: Every day | ORAL | Status: DC
  Administered 2014-11-07 – 2014-11-09 (×3): 2.5 mg via ORAL
  Filled 2014-11-05 (×4): qty 1

## 2014-11-05 MED ORDER — MORPHINE SULFATE (PF) 2 MG/ML IV SOLN
2.0000 mg | INTRAVENOUS | Status: DC | PRN
  Administered 2014-11-05 (×2): 2 mg via INTRAVENOUS
  Filled 2014-11-05 (×3): qty 1

## 2014-11-05 MED ORDER — HYDROCODONE-ACETAMINOPHEN 5-325 MG PO TABS
1.0000 | ORAL_TABLET | Freq: Four times a day (QID) | ORAL | Status: DC | PRN
  Administered 2014-11-05 – 2014-11-09 (×12): 2 via ORAL
  Filled 2014-11-05 (×12): qty 2

## 2014-11-05 MED ORDER — CHOLECALCIFEROL 25 MCG (1000 UT) PO CAPS
1000.0000 [IU] | ORAL_CAPSULE | Freq: Every day | ORAL | Status: DC
  Filled 2014-11-05 (×2): qty 1

## 2014-11-05 MED ORDER — GABAPENTIN 600 MG PO TABS
600.0000 mg | ORAL_TABLET | Freq: Every day | ORAL | Status: DC
Start: 2014-11-06 — End: 2014-11-06
  Administered 2014-11-06: 600 mg via ORAL
  Filled 2014-11-05: qty 1

## 2014-11-05 MED ORDER — AMIODARONE HCL 200 MG PO TABS
200.0000 mg | ORAL_TABLET | Freq: Every day | ORAL | Status: DC
  Administered 2014-11-07 – 2014-11-09 (×3): 200 mg via ORAL
  Filled 2014-11-05 (×3): qty 1

## 2014-11-05 MED ORDER — DOCUSATE SODIUM 100 MG PO CAPS
100.0000 mg | ORAL_CAPSULE | Freq: Two times a day (BID) | ORAL | Status: DC
  Administered 2014-11-06 – 2014-11-09 (×6): 100 mg via ORAL
  Filled 2014-11-05 (×8): qty 1

## 2014-11-05 MED ORDER — PANTOPRAZOLE SODIUM 40 MG PO TBEC
40.0000 mg | DELAYED_RELEASE_TABLET | Freq: Every day | ORAL | Status: DC
  Administered 2014-11-06 – 2014-11-09 (×4): 40 mg via ORAL
  Filled 2014-11-05 (×4): qty 1

## 2014-11-05 MED ORDER — SODIUM CHLORIDE 0.9 % IV SOLN
INTRAVENOUS | Status: DC
  Administered 2014-11-05 – 2014-11-06 (×2): via INTRAVENOUS

## 2014-11-05 MED ORDER — HEPARIN SODIUM (PORCINE) 5000 UNIT/ML IJ SOLN
5000.0000 [IU] | Freq: Three times a day (TID) | INTRAMUSCULAR | Status: DC
  Administered 2014-11-05 – 2014-11-06 (×2): 5000 [IU] via SUBCUTANEOUS
  Filled 2014-11-05 (×2): qty 1

## 2014-11-05 MED ORDER — DEXTROSE 5 % IV SOLN
2.0000 g | INTRAVENOUS | Status: DC
  Administered 2014-11-05 – 2014-11-06 (×2): 2 g via INTRAVENOUS
  Filled 2014-11-05 (×2): qty 2

## 2014-11-05 MED ORDER — SIMVASTATIN 40 MG PO TABS
40.0000 mg | ORAL_TABLET | Freq: Every day | ORAL | Status: DC
  Administered 2014-11-06 – 2014-11-08 (×3): 40 mg via ORAL
  Filled 2014-11-05 (×3): qty 1

## 2014-11-05 MED ORDER — LEVOTHYROXINE SODIUM 112 MCG PO TABS
112.0000 ug | ORAL_TABLET | Freq: Every day | ORAL | Status: DC
  Administered 2014-11-06 – 2014-11-09 (×4): 112 ug via ORAL
  Filled 2014-11-05 (×4): qty 1

## 2014-11-05 MED ORDER — INSULIN ASPART 100 UNIT/ML IV SOLN
10.0000 [IU] | Freq: Once | INTRAVENOUS | Status: AC
  Administered 2014-11-05: 10 [IU] via INTRAVENOUS
  Filled 2014-11-05: qty 0.1

## 2014-11-05 MED ORDER — VANCOMYCIN HCL IN DEXTROSE 1-5 GM/200ML-% IV SOLN
1000.0000 mg | Freq: Once | INTRAVENOUS | Status: AC
  Administered 2014-11-05: 1000 mg via INTRAVENOUS
  Filled 2014-11-05: qty 200

## 2014-11-05 MED ORDER — TEMAZEPAM 15 MG PO CAPS
15.0000 mg | ORAL_CAPSULE | Freq: Every evening | ORAL | Status: DC | PRN
  Administered 2014-11-05: 15 mg via ORAL
  Filled 2014-11-05: qty 1

## 2014-11-05 NOTE — ED Notes (Signed)
Patient transported to X-ray 

## 2014-11-05 NOTE — Progress Notes (Signed)
ANTIBIOTIC CONSULT NOTE - INITIAL  Pharmacy Consult for vancomycin Indication: Osteomyelitis  Allergies  Allergen Reactions  . Codeine Other (See Comments)    Other reaction(s): Other (See Comments) Patient states she did not feel herself when she took it. unknown  . Cymbalta [Duloxetine Hcl] Other (See Comments)    Confusion, disorientation  . Duloxetine     Other reaction(s): Other (See Comments) Confusion, disorientation  . Lyrica [Pregabalin] Other (See Comments)    Patient and son states this makes the patient confused.    Patient Measurements: Height: '5\' 5"'$  (165.1 cm) Weight: 146 lb 11.2 oz (66.543 kg) IBW/kg (Calculated) : 57  Vital Signs: Temp: 98.3 F (36.8 C) (10/23 1553) Temp Source: Oral (10/23 1553) BP: 120/91 mmHg (10/23 1553) Pulse Rate: 50 (10/23 1553) Intake/Output from previous day:   Intake/Output from this shift: Total I/O In: 445 [I.V.:195; IV Piggyback:250] Out: 100 [Urine:100]  Labs:  Recent Labs  11/05/14 1142  WBC 12.7*  HGB 10.1*  PLT 244  CREATININE 2.35*   Estimated Creatinine Clearance: 17.5 mL/min (by C-G formula based on Cr of 2.35). No results for input(s): VANCOTROUGH, VANCOPEAK, VANCORANDOM, GENTTROUGH, GENTPEAK, GENTRANDOM, TOBRATROUGH, TOBRAPEAK, TOBRARND, AMIKACINPEAK, AMIKACINTROU, AMIKACIN in the last 72 hours.   Microbiology: Recent Results (from the past 720 hour(s))  Wound culture     Status: None   Collection Time: 10/09/14  3:00 PM  Result Value Ref Range Status   Specimen Description FOOT LEFT  Final   Special Requests NONE  Final   Gram Stain   Final    RARE WBC SEEN FEW GRAM POSITIVE COCCI IN CLUSTERS    Culture   Final    LIGHT GROWTH METHICILLIN RESISTANT STAPHYLOCOCCUS AUREUS LIGHT GROWTH PROVIDENCIA RETTGERI CRITICAL RESULT CALLED TO, READ BACK BY AND VERIFIED WITH: [place] AT 1118 10/11/14 DV    Report Status 10/13/2014 FINAL  Final   Organism ID, Bacteria METHICILLIN RESISTANT STAPHYLOCOCCUS  AUREUS  Final   Organism ID, Bacteria PROVIDENCIA RETTGERI  Final      Susceptibility   Providencia rettgeri - MIC*    AMPICILLIN <=2 RESISTANT Resistant     CEFAZOLIN >=64 RESISTANT Resistant     CEFTRIAXONE <=1 SENSITIVE Sensitive     CIPROFLOXACIN <=0.25 SENSITIVE Sensitive     GENTAMICIN <=1 SENSITIVE Sensitive     IMIPENEM 1 SENSITIVE Sensitive     NITROFURANTOIN 256 RESISTANT Resistant     TRIMETH/SULFA <=20 SENSITIVE Sensitive     PIP/TAZO Value in next row Sensitive      SENSITIVE<=4    * LIGHT GROWTH PROVIDENCIA RETTGERI   Methicillin resistant staphylococcus aureus - MIC*    CIPROFLOXACIN Value in next row Resistant      SENSITIVE<=4    GENTAMICIN Value in next row Sensitive      SENSITIVE<=4    OXACILLIN Value in next row Resistant      SENSITIVE<=4    VANCOMYCIN Value in next row Sensitive      SENSITIVE<=4    TRIMETH/SULFA Value in next row Sensitive      SENSITIVE<=4    CEFOXITIN SCREEN Value in next row Resistant      POSITIVECEFOXITIN SCREEN - This test may be used to predict mecA-mediated oxacillin resistance, and it is based on the cefoxitin disk screen test.  The cefoxitin screen and oxacillin work in combination to determine the final interpretation reported for oxacillin.     Inducible Clindamycin Value in next row Resistant      POSITIVEINDUCIBLE CLINDAMYCIN RESISTANCE -  A positive ICR test is indicative of inducible resistance to macrolides, lincosamides, and type B streptogramin.  This isolate is presumed to be resistant to Clindamycin, however, Clindamycin may still be effective in some patients.     TETRACYCLINE Value in next row Sensitive      SENSITIVE<=1    * LIGHT GROWTH METHICILLIN RESISTANT STAPHYLOCOCCUS AUREUS  Anaerobic culture     Status: None   Collection Time: 10/09/14  3:00 PM  Result Value Ref Range Status   Specimen Description FOOT LEFT  Final   Special Requests NONE  Final   Culture LIGHT GROWTH FUSOBACTERIUM NUCLEATUM  Final   Report  Status 10/16/2014 FINAL  Final   Organism ID, Bacteria FUSOBACTERIUM NUCLEATUM  Final  Anaerobic culture     Status: None   Collection Time: 10/11/14 12:50 PM  Result Value Ref Range Status   Specimen Description FOOT  Final   Special Requests NONE  Final   Culture   Final    NO ANAEROBES ISOLATED; CULTURE IN PROGRESS FOR 5 DAYS   Report Status 10/15/2014 FINAL  Final  Tissue culture     Status: None   Collection Time: 10/11/14 12:50 PM  Result Value Ref Range Status   Specimen Description FOOT  Final   Special Requests NONE  Final   Gram Stain   Final    RARE WBC SEEN FEW GRAM POSITIVE COCCI RARE GRAM VARIABLE ROD    Culture   Final    LIGHT GROWTH METHICILLIN RESISTANT STAPHYLOCOCCUS AUREUS CRITICAL VALUE NOTED.  VALUE IS CONSISTENT WITH PREVIOUSLY REPORTED AND CALLED VALUE.    Report Status 10/15/2014 FINAL  Final   Organism ID, Bacteria METHICILLIN RESISTANT STAPHYLOCOCCUS AUREUS  Final      Susceptibility   Methicillin resistant staphylococcus aureus - MIC*    CIPROFLOXACIN >=8 RESISTANT Resistant     GENTAMICIN <=0.5 SENSITIVE Sensitive     OXACILLIN >=4 RESISTANT Resistant     VANCOMYCIN 1 SENSITIVE Sensitive     TRIMETH/SULFA <=10 SENSITIVE Sensitive     CEFOXITIN SCREEN Value in next row Resistant      POSITIVECEFOXITIN SCREEN - This test may be used to predict mecA-mediated oxacillin resistance, and it is based on the cefoxitin disk screen test.  The cefoxitin screen and oxacillin work in combination to determine the final interpretation reported for oxacillin.     Inducible Clindamycin Value in next row Resistant      POSITIVEINDUCIBLE CLINDAMYCIN RESISTANCE - A positive ICR test is indicative of inducible resistance to macrolides, lincosamides, and type B streptogramin.  This isolate is presumed to be resistant to Clindamycin, however, Clindamycin may still be effective in some patients.     TETRACYCLINE Value in next row Sensitive      SENSITIVE<=1    * LIGHT  GROWTH METHICILLIN RESISTANT STAPHYLOCOCCUS AUREUS  MRSA PCR Screening     Status: None   Collection Time: 11/05/14  4:39 PM  Result Value Ref Range Status   MRSA by PCR NEGATIVE NEGATIVE Final    Comment:        The GeneXpert MRSA Assay (FDA approved for NASAL specimens only), is one component of a comprehensive MRSA colonization surveillance program. It is not intended to diagnose MRSA infection nor to guide or monitor treatment for MRSA infections.     Medical History: Past Medical History  Diagnosis Date  . CHF (congestive heart failure) ([place])   . Diabetes mellitus without complication ([place])   . Hyperlipidemia   . Atrial  fibrillation ([place])   . Hypothyroid   . Hypertension   . Stroke ([place])   . Neuropathy ([place])   . Anemia   . Edema     feet/ankles occas  . Osteomyelitis ([place])     left first metatarsal    Medications:  Anti-infectives    Start     Dose/Rate Route Frequency Ordered Stop   11/06/14 0400  vancomycin (VANCOCIN) IVPB 1000 mg/200 mL premix     1,000 mg 200 mL/hr over 60 Minutes Intravenous Every 36 hours 11/05/14 1851     11/05/14 1700  cefTRIAXone (ROCEPHIN) 2 g in dextrose 5 % 50 mL IVPB     2 g 100 mL/hr over 30 Minutes Intravenous Every 24 hours 11/05/14 1551     11/05/14 1700  vancomycin (VANCOCIN) IVPB 1000 mg/200 mL premix     1,000 mg 200 mL/hr over 60 Minutes Intravenous  Once 11/05/14 1603 11/05/14 1722     Assessment: Pharmacy consulted to dose vancomycin on this 81 year old female for osteomyelitis. This patient was recently treated with vancomycin for a diabetic foot ulcer and osteomyelitis and was discharged from [facility] on 10/17/14 with a planned duration of 2 weeks of IV antibiotics and was therapeutic at discharge on vancomycin 1g IV q18h. Of note, patient admitted now with acute renal failure and renal function is not at baseline.  Calculated doses/kinetics using actual body weight of 66.5 kg with an estimated CrCl of ~21  mL/min.  Kinetics:  Ke=0.022 Half-life: 31 hours VD=46L Cmin=~19  Goal of Therapy:  Vancomycin trough level 15-20 mcg/ml  Plan:  Follow up culture results  Gave one time dose of 1000 mg vancomycin Started vancomycin 1000 mg IV q36h (with stacked dose starting 11 hours after initial dose) Anticipate that patients renal function may improve and dosing interval will likely need to be changed. For this reason, no trough ordered at this time.   Pharmacy will follow renal function, monitor vancomycin levels, and adjust dose as needed. Thank you for the consult!  Darylene Price Charvez Voorhies 11/05/2014,6:52 PM

## 2014-11-05 NOTE — H&P (Signed)
[facility] [place] NAME: Sharon [name]    MR#:  [id number]  DATE OF BIRTH:  1933/11/05  DATE OF ADMISSION:  11/05/2014  PRIMARY CARE PHYSICIAN: Rusty Aus., MD   REQUESTING/REFERRING PHYSICIAN: Dr. Lisa Roca   CHIEF COMPLAINT:   Chief Complaint  Patient presents with  . Fall    HISTORY OF PRESENT ILLNESS:  Sharon [name]  is a 81 y.o. 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 Patient was discharged to [facility] on 10/18/2014 and she was on vancomycin, oral Cipro and Flagyl. She finished her antibiotics and was discharged on 10/25/2014. She has been ambulating with a walker. She has seen her ID doctor last week who has put her on Bactrim and clindamycin. Her appetite and fluid intake has been poor since her discharge. She says she's been feeling weak over the last couple of days, today she was ambulating with her walker and suddenly she felt weak and fell onto her left side. Denies any chest pain, loss of consciousness after the fall and denies any passing out episode. She had intense pelvic pain and left hip pain after the fall and was brought to the emergency room. X-rays here revealed a left inferior pubic ramus fracture and also her labs show hyperkalemia and acute renal failure.  PAST MEDICAL HISTORY:   Past Medical History  Diagnosis Date  . CHF (congestive heart failure) ([place])   . Diabetes mellitus without complication ([place])   . Hyperlipidemia   . Atrial fibrillation ([place])   . Hypothyroid   . Hypertension   . Stroke ([place])   . Neuropathy ([place])   . Anemia   . Edema     feet/ankles occas  . Osteomyelitis ([place])     left first metatarsal    PAST SURGICAL HISTORY:    Past Surgical History  Procedure Laterality Date  . Hemiarthroplasty hip Right   . Hemiarthroplasty hip Left   . Cholecystectomy    . Appendectomy    . Cardiac catheterization  08/25/13  . Coronary angioplasty    . Cataract extraction w/phaco Left 07/20/2014    Procedure: CATARACT EXTRACTION PHACO AND INTRAOCULAR LENS PLACEMENT (IOC);  Surgeon: Leandrew Koyanagi, MD;  Location: [facility] ORS;  Service: Ophthalmology;  Laterality: Left;  [place]  1:18                 AP     23.6             CDE   9.69      lot #[id number]  . Incision and drainage Left 10/11/2014    Procedure: Removal of infected tibial sessmoid;  Surgeon: Albertine Patricia, DPM;  Location: [facility] ORS;  Service: Podiatry;  Laterality: Left;    SOCIAL HISTORY:   Social History  Substance Use Topics  . Smoking status: Former Smoker -- 0.50 packs/day for 7 years    Types: Cigarettes    Quit date: 05/30/1989  . Smokeless tobacco: Never Used  . Alcohol Use: No    FAMILY HISTORY:   Family History  Problem Relation Age of Onset  . Heart attack Mother     DRUG ALLERGIES:   Allergies  Allergen Reactions  . Codeine Other (See Comments)    Other reaction(s): Other (See Comments)  Patient states she did not feel herself when she took it. unknown  . Cymbalta [Duloxetine Hcl] Other (See Comments)    Confusion, disorientation  . Duloxetine     Other reaction(s): Other (See Comments) Confusion, disorientation  . Lyrica [Pregabalin] Other (See Comments)    Patient and son states this makes the patient confused.    REVIEW OF SYSTEMS:   Review of Systems  Constitutional: Negative for fever, chills, weight loss and malaise/fatigue.  HENT: Negative for ear discharge, ear pain, hearing loss, nosebleeds and tinnitus.   Eyes: Negative for blurred vision, double vision and photophobia.  Respiratory: Negative for cough, hemoptysis, shortness of breath and wheezing.   Cardiovascular: Negative for chest pain, palpitations, orthopnea and  leg swelling.  Gastrointestinal: Negative for heartburn, nausea, vomiting, abdominal pain, diarrhea, constipation and melena.  Genitourinary: Negative for dysuria, urgency, frequency and hematuria.  Musculoskeletal: Positive for myalgias and joint pain. Negative for back pain and neck pain.  Skin: Negative for rash.  Neurological: Positive for weakness. Negative for dizziness, tingling, tremors, sensory change, speech change, focal weakness and headaches.  Endo/Heme/Allergies: Does not bruise/bleed easily.  Psychiatric/Behavioral: Negative for depression.    MEDICATIONS AT HOME:   Prior to Admission medications   Medication Sig Start Date End Date Taking? Authorizing Provider  acetaminophen (TYLENOL) 500 MG tablet Take 500 mg by mouth every 4 (four) hours as needed for mild pain or moderate pain.   Yes Historical Provider, MD  amiodarone (PACERONE) 200 MG tablet Take one tablet by mouth once daily for AFIB   Yes Historical Provider, MD  aspirin 81 MG tablet Take 81 mg by mouth daily.   Yes Historical Provider, MD  Cholecalciferol 1000 UNITS capsule Take 1,000 Units by mouth daily.   Yes Historical Provider, MD  clindamycin (CLEOCIN) 150 MG capsule Take 300 mg by mouth 3 (three) times daily. X 7 days. 10/30/14 11/06/14 Yes Historical Provider, MD  ferrous sulfate 325 (65 FE) MG tablet Take 325 mg by mouth daily with breakfast.   Yes Historical Provider, MD  gabapentin (NEURONTIN) 600 MG tablet Take one tablet by mouth twice daily for neuropathy   Yes Historical Provider, MD  GLIPIZIDE XL 2.5 MG 24 hr tablet Take 2.5 mg by mouth daily. 10/14/14  Yes Historical Provider, MD  glyBURIDE-metformin (GLUCOVANCE) 5-500 MG tablet Take 1 tablet by mouth daily with breakfast.   Yes Historical Provider, MD  HYDROcodone-acetaminophen (NORCO/VICODIN) 5-325 MG tablet Take 1-2 tablets by mouth every 6 (six) hours as needed. For pain 11/03/14  Yes Historical Provider, MD  levothyroxine (SYNTHROID, LEVOTHROID)  112 MCG tablet Take one tablet by mouth once daily 30 minutes before breakfast once daily for hypothyroid   Yes Historical Provider, MD  lisinopril (PRINIVIL,ZESTRIL) 10 MG tablet Take 10 mg by mouth daily.   Yes Historical Provider, MD  metoprolol succinate (TOPROL-XL) 100 MG 24 hr tablet Take 100 mg by mouth daily. 10/14/14  Yes Historical Provider, MD  metoprolol tartrate (LOPRESSOR) 25 MG tablet Take 25 mg by mouth daily. 10/27/14  Yes Historical Provider, MD  Multiple Vitamins-Minerals (PRESERVISION AREDS 2 PO) Take 2 tablets by mouth daily.   Yes Historical Provider, MD  potassium chloride (KLOR-CON) 8 MEQ tablet Take 8 mEq by mouth daily. 10/31/14  Yes Historical Provider, MD  simvastatin (ZOCOR) 40 MG tablet Take one tablet by mouth once daily at bedtime for cholesterol   Yes Historical Provider, MD  sulfamethoxazole-trimethoprim (BACTRIM DS,SEPTRA DS) 800-160 MG tablet Take 1 tablet by mouth 2 (two)  times daily. 10/27/14  Yes Historical Provider, MD  temazepam (RESTORIL) 15 MG capsule Take 15 mg by mouth at bedtime as needed for sleep.   Yes Historical Provider, MD      VITAL SIGNS:  Blood pressure 112/69, pulse 41, temperature 97.6 F (36.4 C), temperature source Oral, resp. rate 13, height '5\' 5"'$  (1.651 m), weight 58.514 kg (129 lb), SpO2 93 %.  PHYSICAL EXAMINATION:   Physical Exam  GENERAL:  81 y.o.-year-old patient lying in the bed with no acute distress.  EYES: Pupils equal, round, reactive to light and accommodation. No scleral icterus. Extraocular muscles intact.  HEENT: Head atraumatic, normocephalic. Oropharynx and nasopharynx clear.  NECK:  Supple, no jugular venous distention. No thyroid enlargement, no tenderness.  LUNGS: Normal breath sounds bilaterally, no wheezing, rales,rhonchi or crepitation. No use of accessory muscles of respiration.  CARDIOVASCULAR: S1, S2 normal. No  rubs, or gallops. 3/6 systolic murmur present ABDOMEN: Soft, nontender, nondistended. Bowel  sounds present. No organomegaly or mass.  EXTREMITIES: No cyanosis, or clubbing. Right foot no edema, left foot has 2+ pedal edema. There's an open wound on the plantar surface of left foot with sutures in place, gaping noted, serosanguineous discharge noted. NEUROLOGIC: Cranial nerves II through XII are intact. Muscle strength 5/5 in all extremities, however the left leg movement is guarded due to pelvic fracture. Sensation intact. Gait not checked.  PSYCHIATRIC: The patient is alert and oriented x 3.  SKIN: No obvious rash, lesion, or ulcer.   LABORATORY PANEL:   CBC  Recent Labs Lab 11/05/14 1142  WBC 12.7*  HGB 10.1*  HCT 33.0*  PLT 244   ------------------------------------------------------------------------------------------------------------------  Chemistries   Recent Labs Lab 11/05/14 1142  NA 131*  K 6.4*  CL 104  CO2 20*  GLUCOSE 199*  BUN 36*  CREATININE 2.35*  CALCIUM 8.7*   ------------------------------------------------------------------------------------------------------------------  Cardiac Enzymes No results for input(s): TROPONINI in the last 168 hours. ------------------------------------------------------------------------------------------------------------------  RADIOLOGY:  Dg Pelvis 1-2 Views  11/05/2014  CLINICAL DATA:  81 year old female with pelvic pain following fall today. Initial encounter. EXAM: PELVIS - 1-2 VIEW COMPARISON:  05/26/2012 and prior radiographs FINDINGS: A mildly comminuted fracture of the inferior left pubic ramus is noted. Bilateral hip hemiarthroplasties identified. There is no evidence of subluxation or dislocation. Degenerative changes in the lower lumbar spine are present. IMPRESSION: Mildly comminuted fracture of the left inferior pubic ramus. No other discrete fracture identified. Bilateral hip hemiarthroplasties without definite complicating features. Electronically Signed   By: Margarette Canada M.D.   On: 11/05/2014  12:42   Ct Head Wo Contrast  11/05/2014  CLINICAL DATA:  [place] this morning at home, poor historian, uncertain loss of consciousness, history stroke, CHF, diabetes mellitus, atrial fibrillation, hypertension, former smoker, initial encounter EXAM: CT HEAD WITHOUT CONTRAST TECHNIQUE: Contiguous axial images were obtained from the base of the skull through the vertex without intravenous contrast. COMPARISON:  None FINDINGS: Normal ventricular morphology. Minimal age-related atrophy. No midline shift or mass effect. Few streak artifacts at skull base. Otherwise normal appearance of brain parenchyma. No intracranial hemorrhage, mass lesion or evidence acute infarction. No extra-axial fluid collections. Hyperostosis frontalis interna. Osseous structures otherwise unremarkable. Minimal atherosclerotic calcification at carotid siphons. Visualized paranasal sinuses clear. Small partial LEFT mastoid effusion. IMPRESSION: No acute intracranial abnormalities. Small LEFT mastoid effusion. Electronically Signed   By: Lavonia Dana M.D.   On: 11/05/2014 12:33    EKG:   Orders placed or performed during the hospital encounter of 07/10/14  .  EKG 12-Lead  . EKG 12-Lead    IMPRESSION AND PLAN:   Sharon [name]  is a 81 y.o. 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  #1 fall and pelvic fracture-pain management. -Physical therapy consult and likely rehabilitation placement.  #2 hyperkalemia-was on potassium supplements at home and also taking Bactrim. -Discontinue both. -Kayexalate given. Recheck potassium later today.  #3 acute renal failure-ATN and also drug-induced from Bactrim. -Gentle hydration ordered. Hold nephrotoxins. Discontinue Bactrim. -We'll recheck labs later today.  If no improvement, then will get renal ultrasound and also nephrology consult -Baseline creatinine of 1.2, creatinine today is 2.3 with elevated BUN as well  #4 osteomyelitis of Tibial seasmoid bone-patient had osteomyelitis of her left foot and had surgery for removal of infected Tibial sesamoid bone 2 weeks ago. -Still open wound with minimal discharge noted. Chest was started on antibiotics by Dr. Ola Spurr last week. -Discontinue her Bactrim and clindamycin -Started on vancomycin and Rocephin based on previous cultures growing MRSA and Providencia. -ID and podiatry consults requested  #5 atrial fibrillation- rate is controlled. -Continue her amiodarone. Follows with Dr. Ubaldo Glassing. -Bradycardic, so hold her metoprolol. -Not on anticoagulation, continue her aspirin.  #6 GERD-Protonix  #7 diabetes mellitus-continue glipizide. Hold metformin due to renal failure. -Started on sliding scale insulin.  #8 DVT prophylaxis-on subcutaneous heparin  All the records are reviewed and case discussed with ED provider. Management plans discussed with the patient, family and they are in agreement.  CODE STATUS: Full Code  TOTAL TIME TAKING CARE OF THIS PATIENT: 50 minutes.    Jamair Cato M.D on 11/05/2014 at 3:40 PM  Between 7am to 6pm - Pager - [phone]  After 6pm go to [email or web address] - password [email or web address]  [facility]  Office  [phone]  CC: Primary care physician; Rusty Aus., MD

## 2014-11-05 NOTE — Progress Notes (Signed)
RN noted ST elevation or peaked T waves on telemetry strip. Dr. Tressia Miners paged and made aware. MD to put in orders for EKG, f/u BMP is scheduled for later this evening; pt has not had a BM since kayexalate administration and MD is aware.

## 2014-11-05 NOTE — ED Notes (Signed)
Pt presents with dressing to L foot she says is from ongoing wound care from foot surgery in Sep. Pt also has 2 skin tears to RFA from fall. Denies use of blood thinners. States she feels "like my legs just gave out." Uses RW at home.

## 2014-11-05 NOTE — Progress Notes (Signed)
Pt. admitted to unit, rm 258. Oriented to room, call bell, Ascom phones and staff. Bed in low position. Fall safety plan reviewed, contract signed and placed on wall, yellow non-skid socks in place, bed alarm on. Pt will use bedpan due to hip fracture, until otherwise is heard from MD. Full assessment to Epic. Will continue to monitor.   Skin assessed with Alfonse Flavors., RN. Pt has skin tears bilateral arms, wrapped in clean gauze with no drainage. Surgical wound to L bottom of foot, sutures in place, dressing removed, wound cleansed with NS solution and wound redressed with clean gauze and kerlex. Prophylactic sacral dressing placed.

## 2014-11-05 NOTE — Progress Notes (Addendum)
Pt continues to complain of 8/10 hip pain, only has vicodin PRN ordered. Dr. Tressia Miners paged to see if patient can have a second pain medication option. MD to put in orders. RN also told Dr. Tressia Miners EKG results, which continue to show peaked T waves (confirmed with RT). BMP is scheduled for 2000 tonight.

## 2014-11-05 NOTE — ED Notes (Signed)
Per EMS pt from home with c/o fall this am. Pain to left hip. Unable to bear weight. CBG 262. HR 45. Recent foot surgery with MRSA to left foot. No lOC. Skin tear to left forearm.

## 2014-11-05 NOTE — Consult Note (Signed)
Reason for Consult: Open postsurgical wound on the left foot Referring Physician: [facility]  Sharon [name] is an 81 y.o. female.  HPI: The patient states that she did become disoriented earlier today and experienced a fall. She does not know specifically how it happened. She did present to the emergency department where she was diagnosed with a fracture in her pelvis. Decision was made to admit the patient. She does have a surgical wound on her left foot for removal of an infected bone by Dr. Orland Jarred approximately 2 weeks ago. States that home health care has been changing the bandage for her. States that she thinks her foot is doing pretty good.  Past Medical History  Diagnosis Date  . CHF (congestive heart failure) (HCC)   . Diabetes mellitus without complication (HCC)   . Hyperlipidemia   . Atrial fibrillation (HCC)   . Hypothyroid   . Hypertension   . Stroke (HCC)   . Neuropathy (HCC)   . Anemia   . Edema     feet/ankles occas  . Osteomyelitis (HCC)     left first metatarsal    Past Surgical History  Procedure Laterality Date  . Hemiarthroplasty hip Right   . Hemiarthroplasty hip Left   . Cholecystectomy    . Appendectomy    . Cardiac catheterization  08/25/13  . Coronary angioplasty    . Cataract extraction w/phaco Left 07/20/2014    Procedure: CATARACT EXTRACTION PHACO AND INTRAOCULAR LENS PLACEMENT (IOC);  Surgeon: Lockie Mola, MD;  Location: [facility] ORS;  Service: Ophthalmology;  Laterality: Left;  [place]  1:18                 AP     23.6             CDE   9.69      lot #[id number]  . Incision and drainage Left 10/11/2014    Procedure: Removal of infected tibial sessmoid;  Surgeon: Recardo Evangelist, DPM;  Location: [facility] ORS;  Service: Podiatry;  Laterality: Left;    Family History  Problem Relation Age of Onset  . Heart attack Mother     Social History:  reports that she quit smoking about 25 years ago. Her smoking use included Cigarettes. She has a  3.5 pack-year smoking history. She has never used smokeless tobacco. She reports that she does not drink alcohol or use illicit drugs.  Allergies:  Allergies  Allergen Reactions  . Codeine Other (See Comments)    Other reaction(s): Other (See Comments) Patient states she did not feel herself when she took it. unknown  . Cymbalta [Duloxetine Hcl] Other (See Comments)    Confusion, disorientation  . Duloxetine     Other reaction(s): Other (See Comments) Confusion, disorientation  . Lyrica [Pregabalin] Other (See Comments)    Patient and son states this makes the patient confused.    Medications: I have reviewed the patient's current medications.  Results for orders placed or performed during the hospital encounter of 11/05/14 (from the past 48 hour(s))  Basic metabolic panel     Status: Abnormal   Collection Time: 11/05/14 11:42 AM  Result Value Ref Range   Sodium 131 (L) 135 - 145 mmol/L   Potassium 6.4 (H) 3.5 - 5.1 mmol/L   Chloride 104 101 - 111 mmol/L   CO2 20 (L) 22 - 32 mmol/L   Glucose, Bld 199 (H) 65 - 99 mg/dL   BUN 36 (H) 6 - 20 mg/dL  Creatinine, Ser 2.35 (H) 0.44 - 1.00 mg/dL   Calcium 8.7 (L) 8.9 - 10.3 mg/dL   GFR calc non Af Amer 19 (L) >60 mL/min   GFR calc Af Amer 22 (L) >60 mL/min    Comment: (NOTE) The eGFR has been calculated using the CKD EPI equation. This calculation has not been validated in all clinical situations. eGFR's persistently <60 mL/min signify possible Chronic Kidney Disease.    Anion gap 7 5 - 15  CBC with Differential     Status: Abnormal   Collection Time: 11/05/14 11:42 AM  Result Value Ref Range   WBC 12.7 (H) 3.6 - 11.0 K/uL   RBC 3.97 3.80 - 5.20 MIL/uL   Hemoglobin 10.1 (L) 12.0 - 16.0 g/dL   HCT 97.1 (L) 95.3 - 86.7 %   MCV 83.2 80.0 - 100.0 fL   MCH 25.6 (L) 26.0 - 34.0 pg   MCHC 30.7 (L) 32.0 - 36.0 g/dL   RDW 30.5 (H) 20.8 - 15.8 %   Platelets 244 150 - 440 K/uL   Neutrophils Relative % 80 %   Neutro Abs 10.3 (H) 1.4  - 6.5 K/uL   Lymphocytes Relative 9 %   Lymphs Abs 1.1 1.0 - 3.6 K/uL   Monocytes Relative 6 %   Monocytes Absolute 0.7 0.2 - 0.9 K/uL   Eosinophils Relative 3 %   Eosinophils Absolute 0.4 0 - 0.7 K/uL   Basophils Relative 2 %   Basophils Absolute 0.2 (H) 0 - 0.1 K/uL  Urinalysis complete, with microscopic     Status: Abnormal   Collection Time: 11/05/14 11:42 AM  Result Value Ref Range   Color, Urine YELLOW (A) YELLOW   APPearance CLEAR (A) CLEAR   Glucose, UA NEGATIVE NEGATIVE mg/dL   Bilirubin Urine NEGATIVE NEGATIVE   Ketones, ur NEGATIVE NEGATIVE mg/dL   Specific Gravity, Urine 1.013 1.005 - 1.030   Hgb urine dipstick NEGATIVE NEGATIVE   pH 5.0 5.0 - 8.0   Protein, ur NEGATIVE NEGATIVE mg/dL   Nitrite NEGATIVE NEGATIVE   Leukocytes, UA TRACE (A) NEGATIVE   RBC / HPF 6-30 0 - 5 RBC/hpf   WBC, UA 0-5 0 - 5 WBC/hpf   Bacteria, UA RARE (A) NONE SEEN   Squamous Epithelial / LPF 6-30 (A) NONE SEEN   Trans Epithel, UA 3   Hemoglobin A1c     Status: None   Collection Time: 11/05/14 11:42 AM  Result Value Ref Range   Hgb A1c MFr Bld 5.9 4.0 - 6.0 %  Glucose, capillary     Status: None   Collection Time: 11/05/14  3:52 PM  Result Value Ref Range   Glucose-Capillary 81 65 - 99 mg/dL  MRSA PCR Screening     Status: None   Collection Time: 11/05/14  4:39 PM  Result Value Ref Range   MRSA by PCR NEGATIVE NEGATIVE    Comment:        The GeneXpert MRSA Assay (FDA approved for NASAL specimens only), is one component of a comprehensive MRSA colonization surveillance program. It is not intended to diagnose MRSA infection nor to guide or monitor treatment for MRSA infections.   Glucose, capillary     Status: Abnormal   Collection Time: 11/05/14  5:46 PM  Result Value Ref Range   Glucose-Capillary 172 (H) 65 - 99 mg/dL   Comment 1 Notify RN   Basic metabolic panel     Status: Abnormal   Collection Time: 11/05/14  7:41 PM  Result Value Ref Range   Sodium 133 (L) 135 - 145  mmol/L   Potassium 6.0 (H) 3.5 - 5.1 mmol/L   Chloride 106 101 - 111 mmol/L   CO2 19 (L) 22 - 32 mmol/L   Glucose, Bld 79 65 - 99 mg/dL   BUN 32 (H) 6 - 20 mg/dL   Creatinine, Ser 2.19 (H) 0.44 - 1.00 mg/dL   Calcium 7.8 (L) 8.9 - 10.3 mg/dL   GFR calc non Af Amer 20 (L) >60 mL/min   GFR calc Af Amer 23 (L) >60 mL/min    Comment: (NOTE) The eGFR has been calculated using the CKD EPI equation. This calculation has not been validated in all clinical situations. eGFR's persistently <60 mL/min signify possible Chronic Kidney Disease.    Anion gap 8 5 - 15    Dg Pelvis 1-2 Views  11/05/2014  CLINICAL DATA:  81 year old female with pelvic pain following fall today. Initial encounter. EXAM: PELVIS - 1-2 VIEW COMPARISON:  05/26/2012 and prior radiographs FINDINGS: A mildly comminuted fracture of the inferior left pubic ramus is noted. Bilateral hip hemiarthroplasties identified. There is no evidence of subluxation or dislocation. Degenerative changes in the lower lumbar spine are present. IMPRESSION: Mildly comminuted fracture of the left inferior pubic ramus. No other discrete fracture identified. Bilateral hip hemiarthroplasties without definite complicating features. Electronically Signed   By: Margarette Canada M.D.   On: 11/05/2014 12:42   Ct Head Wo Contrast  11/05/2014  CLINICAL DATA:  [place] this morning at home, poor historian, uncertain loss of consciousness, history stroke, CHF, diabetes mellitus, atrial fibrillation, hypertension, former smoker, initial encounter EXAM: CT HEAD WITHOUT CONTRAST TECHNIQUE: Contiguous axial images were obtained from the base of the skull through the vertex without intravenous contrast. COMPARISON:  None FINDINGS: Normal ventricular morphology. Minimal age-related atrophy. No midline shift or mass effect. Few streak artifacts at skull base. Otherwise normal appearance of brain parenchyma. No intracranial hemorrhage, mass lesion or evidence acute infarction. No  extra-axial fluid collections. Hyperostosis frontalis interna. Osseous structures otherwise unremarkable. Minimal atherosclerotic calcification at carotid siphons. Visualized paranasal sinuses clear. Small partial LEFT mastoid effusion. IMPRESSION: No acute intracranial abnormalities. Small LEFT mastoid effusion. Electronically Signed   By: Lavonia Dana M.D.   On: 11/05/2014 12:33    Review of Systems  Constitutional: Negative.   HENT: Negative.   Eyes: Negative.   Respiratory: Negative.   Cardiovascular: Negative.   Gastrointestinal: Negative.   Genitourinary: Negative.   Musculoskeletal: Positive for myalgias and joint pain.       Mostly involving her left hip due to her fracture  Skin:       Wound on her left foot status post surgery. Home health care has been changing the bandage  Neurological:       She does relate significant neuropathy related to her diabetes  Endo/Heme/Allergies: Negative.   Psychiatric/Behavioral: Negative.    Blood pressure 120/91, pulse 50, temperature 98.3 F (36.8 C), temperature source Oral, resp. rate 18, height $RemoveBe'5\' 5"'MzpylnYbb$  (1.651 m), weight 66.543 kg (146 lb 11.2 oz), SpO2 99 %. Physical Exam  Cardiovascular:  Pulses are palpable in the left foot  Musculoskeletal: Normal range of motion.  No pain on palpation around the surgical site at the left great toe joint  Neurological:  Loss of protective sensation  Skin:  The skin is warm dry and mildly atrophic. Intact sutures in the incision area on the plantar aspect of the left great toe joint. An approximate 8 mm  x 4 mm opening is present in the central aspect of the incision. Packing material is intact within the wound. Upon removal there is no significant purulence. No bleeding. The wound does probe down to the head of the first metatarsal which is probed with a cotton-tipped applicator    Assessment/Plan: Assessment: Osteomyelitis left tibial sesamoid status post debridement, stable. Diabetes with associated  neuropathy  Plan: The remaining sutures in her left foot were removed today. The packing material was removed and the wound was repacked with sterile saline wet-to-dry gauze. We will continue daily wet-to-dry dressing changes until her discharge and then home health care will resume her treatment. Recommend follow-up with Dr. Elvina Mattes in approximately 2 weeks outpatient.  Mkayla Steele W. 11/05/2014, 9:00 PM

## 2014-11-05 NOTE — Progress Notes (Signed)
Respiratory notified of stat EKG order.

## 2014-11-05 NOTE — ED Provider Notes (Signed)
[facility] Emergency Department Provider Note   ____________________________________________  Time seen: 11:35 AM I have reviewed the triage vital signs and the triage nursing note.  HISTORY  Chief Complaint Fall   Historian [employer], patient is a bit of a poor historian.  HPI Sharon [name] is a 81 y.o. female reports getting tripped up on her walker and having a fall this morning, now complaining of left hip pain.she is unclear whether or not she hit her head. She's not complaining of any neck pain or back pain. She's not having any chest or abdominal pain. She's having no trouble breathing. Pain in the hip is moderate especially with movement. She reports a history of hip surgeries in the past. She currently a skin tear in right forearm from a previous fall. She is also being followed by a podiatrist for wound on the left foot.    Past Medical History  Diagnosis Date  . CHF (congestive heart failure) ([place])   . Diabetes mellitus without complication ([place])   . Hyperlipidemia   . Atrial fibrillation ([place])   . Hypothyroid   . Hypertension   . Stroke ([place])   . Neuropathy ([place])   . Anemia   . Edema     feet/ankles occas    Patient Active Problem List   Diagnosis Date Noted  . Diabetic foot infection ([place]) 10/10/2014  . Chronic diastolic heart failure ([place]) 05/31/2014  . HTN (hypertension) 05/31/2014  . DM (diabetes mellitus) type 2, uncontrolled, with ketoacidosis ([place]) 05/31/2014    Past Surgical History  Procedure Laterality Date  . Hemiarthroplasty hip Right   . Hemiarthroplasty hip Left   . Cholecystectomy    . Appendectomy    . Cardiac catheterization  08/25/13  . Coronary angioplasty    . Cataract extraction w/phaco Left 07/20/2014    Procedure: CATARACT EXTRACTION PHACO AND INTRAOCULAR LENS PLACEMENT (IOC);  Surgeon: Leandrew Koyanagi, MD;  Location: [facility] ORS;  Service: Ophthalmology;  Laterality: Left;  [place]  1:18                 AP      23.6             CDE   9.69      lot #[id number]  . Incision and drainage Left 10/11/2014    Procedure: Removal of infected tibial sessmoid;  Surgeon: Albertine Patricia, DPM;  Location: [facility] ORS;  Service: Podiatry;  Laterality: Left;    Current Outpatient Rx  Name  Route  Sig  Dispense  Refill  . acetaminophen (TYLENOL) 325 MG tablet   Oral   Take 2 tablets (650 mg total) by mouth every 6 (six) hours as needed for mild pain (or Fever >/= 101).   1 tablet   1   . amiodarone (PACERONE) 200 MG tablet      Take one tablet by mouth once daily for AFIB         . aspirin 81 MG tablet   Oral   Take 81 mg by mouth daily.         . Cholecalciferol 1000 UNITS capsule   Oral   Take 1,000 Units by mouth daily.         . ciprofloxacin (CIPRO) 500 MG tablet   Oral   Take 1 tablet (500 mg total) by mouth 2 (two) times daily.   1 tablet   1   . docusate sodium (COLACE) 100 MG capsule   Oral   Take  1 capsule (100 mg total) by mouth 2 (two) times daily. Patient taking differently: Take one capsule by mouth twice daily for constipation   10 capsule   0   . ferrous sulfate 325 (65 FE) MG tablet   Oral   Take 325 mg by mouth daily with breakfast.         . gabapentin (NEURONTIN) 600 MG tablet      Take one tablet by mouth twice daily for neuropathy         . glipiZIDE (GLUCOTROL) 10 MG tablet      Take one tablet by mouth every morning for diabetes         . HYDROcodone-acetaminophen (NORCO/VICODIN) 5-325 MG tablet   Oral   Take 1 tablet by mouth every 6 (six) hours as needed for severe pain.   30 tablet   0   . levothyroxine (SYNTHROID, LEVOTHROID) 112 MCG tablet      Take one tablet by mouth once daily 30 minutes before breakfast once daily for hypothyroid         . lisinopril (PRINIVIL,ZESTRIL) 5 MG tablet      Take one tablet by mouth once daily for Hypertension         . metoprolol tartrate (LOPRESSOR) 25 MG tablet      Take one tablet by mouth every day  at bedtime for hypertension         . metroNIDAZOLE (FLAGYL) 500 MG tablet   Oral   Take 1 tablet (500 mg total) by mouth every 8 (eight) hours.   1 tablet   1   . pantoprazole (PROTONIX) 40 MG tablet   Oral   Take 1 tablet (40 mg total) by mouth daily. Patient taking differently: Take one tablet by mouth every morning for GERD   1 tablet   1   . simvastatin (ZOCOR) 40 MG tablet      Take one tablet by mouth once daily at bedtime for cholesterol         . temazepam (RESTORIL) 15 MG capsule   Oral   Take 15 mg by mouth at bedtime as needed for sleep.         . vancomycin (VANCOCIN) 1 GM/200ML SOLN   Intravenous   Inject 200 mLs (1,000 mg total) into the vein every 18 (eighteen) hours.   4000 mL   1     Allergies Codeine; Cymbalta; and Duloxetine  Family History  Problem Relation Age of Onset  . Heart attack Mother     Social History Social History  Substance Use Topics  . Smoking status: Former Smoker -- 0.50 packs/day for 7 years    Types: Cigarettes    Quit date: 05/31/1998  . Smokeless tobacco: Never Used  . Alcohol Use: No    Review of Systems  Constitutional: Negative for fever. Eyes: Negative for visual changes. ENT: Negative for sore throat. Cardiovascular: Negative for chest pain. Respiratory: Negative for shortness of breath. Gastrointestinal: Negative for abdominal pain, vomiting and diarrhea. Genitourinary: Negative for dysuria. Musculoskeletal: Negative for back pain. Skin: Negative for rash. Neurological: Negative for headache. 10 point Review of Systems otherwise negative ____________________________________________   PHYSICAL EXAM:  VITAL SIGNS: ED Triage Vitals  Enc Vitals Group     BP 11/05/14 1130 138/44 mmHg     Pulse Rate 11/05/14 1130 56     Resp 11/05/14 1130 16     Temp 11/05/14 1130 97.6 F (36.4 C)     Temp  Source 11/05/14 1130 Oral     SpO2 11/05/14 1130 99 %     Weight 11/05/14 1130 129 lb (58.514 kg)      Height 11/05/14 1130 '5\' 5"'$  (1.651 m)     Head Cir --      Peak Flow --      Pain Score 11/05/14 1128 8     Pain Loc --      Pain Edu? --      Excl. in [place]? --      Constitutional: Alert and cooperative. Well appearing and in no distress. Eyes: Conjunctivae are normal. PERRL. Normal extraocular movements. ENT   Head: Normocephalic and atraumatic.   Nose: No congestion/rhinnorhea.   Mouth/Throat: Mucous membranes are moist.   Neck: No stridor. Cardiovascular/Chest: Normal rate, regular rhythm.  No murmurs, rubs, or gallops. Respiratory: Normal respiratory effort without tachypnea nor retractions. Breath sounds are clear and equal bilaterally. No wheezes/rales/rhonchi. Gastrointestinal: Soft. No distention, no guarding, no rebound. Nontender  Genitourinary/rectal:Deferred Musculoskeletal: pelvis stable. Left hip tenderness with range of motion. Wound dressed to left foot. Right forearm with bandages intact from prior reported skin tears. Neurologic:  Normal speech and language. No gross or focal neurologic deficits are appreciated. patient is a poor historian, and states that her husband thinks her medications. Skin:  No acute injuries.   ____________________________________________   EKG I, Lisa Roca, MD, the attending physician have personally viewed and interpreted all ECGs.  47 bpm. Sinus bradycardia. Narrow QRS. Normal axis. Nonspecific ST/T wave.  Prior EKG bradycardic in the upper 50s. ____________________________________________  LABS (pertinent positives/negatives)  Sodium 131, potassium 6.4, CO2 28, BUN 36 and creatinine 2.35 White blood cell count 12.7, hemoglobin is 10.1, platelet count 244 Urinalysis trace leukocytes, 6-30 red blood cells, and 0-5 white blood cells, rare bacteria, negative nitrites, 6-30 squamous epithelial cells  ____________________________________________  RADIOLOGY All Xrays were viewed by me. Imaging interpreted by  Radiologist.  Left hip with pelvis:  IMPRESSION: Mildly comminuted fracture of the left inferior pubic ramus. No other discrete fracture identified.  Bilateral hip hemiarthroplasties without definite complicating Features.  CT noncontrast:  IMPRESSION: No acute intracranial abnormalities.  Small LEFT mastoid effusion. __________________________________________  PROCEDURES  Procedure(s) performed: None  Critical Care performed: None  ____________________________________________   ED COURSE / ASSESSMENT AND PLAN  CONSULTATIONS: hospitalist for admission  Pertinent labs & imaging results that were available during my care of the patient were reviewed by me and considered in my medical decision making (see chart for details).   Patient was evaluated after what sounds like may have been amechanical fall, , however I will check a screening labs and urine and to evaluate for underlying source of patient being more balance.  It is unclear to me whether or not she is at her mental status baseline, she is very poor historian. She is on aspirin, and is unclear whether or not she struck her head, and so I Will obtain a head CT to rule out intracranial injury.  Skin shows no intercranial injury.  Urinalysis  Appears to be more consistent with contamination than acute urinary tract infection. A culture was sent.  On her laboratory evaluation she was found to have acute kidney failure with hyperkalemia. Patient was given IV fluids and it is Kayexalate. Patient has a new pelvic fracture, limiting her ability to walk, and she is going to need evaluation for possible placement. She also need hydration and recheck her kidney function. Hospitalist was consulted for admission.  Patient /  Family / Caregiver informed of clinical course, medical decision-making process, and agree with plan.    ___________________________________________   FINAL CLINICAL IMPRESSION(S) / ED  DIAGNOSES   Final diagnoses:  Acute renal failure, unspecified acute renal failure type (HCC)  Hyperkalemia  Bradycardia  Inferior pubic ramus fracture, left, closed, initial encounter (HCC)       Lisa Roca, MD 11/05/14 1256

## 2014-11-06 LAB — CBC WITH DIFFERENTIAL/PLATELET
Basophils Absolute: 0.2 10*3/uL — ABNORMAL HIGH (ref 0–0.1)
Basophils Relative: 2 %
Eosinophils Absolute: 0.3 10*3/uL (ref 0–0.7)
Eosinophils Relative: 5 %
HCT: 25.9 % — ABNORMAL LOW (ref 35.0–47.0)
Hemoglobin: 8.2 g/dL — ABNORMAL LOW (ref 12.0–16.0)
Lymphocytes Relative: 17 %
Lymphs Abs: 1.2 10*3/uL (ref 1.0–3.6)
MCH: 25.9 pg — ABNORMAL LOW (ref 26.0–34.0)
MCHC: 31.7 g/dL — ABNORMAL LOW (ref 32.0–36.0)
MCV: 81.6 fL (ref 80.0–100.0)
Monocytes Absolute: 0.6 10*3/uL (ref 0.2–0.9)
Monocytes Relative: 9 %
Neutro Abs: 4.6 10*3/uL (ref 1.4–6.5)
Neutrophils Relative %: 67 %
Platelets: 177 10*3/uL (ref 150–440)
RBC: 3.17 MIL/uL — ABNORMAL LOW (ref 3.80–5.20)
RDW: 18.5 % — ABNORMAL HIGH (ref 11.5–14.5)
WBC: 6.9 10*3/uL (ref 3.6–11.0)

## 2014-11-06 LAB — GLUCOSE, CAPILLARY
GLUCOSE-CAPILLARY: 139 mg/dL — AB (ref 65–99)
GLUCOSE-CAPILLARY: 155 mg/dL — AB (ref 65–99)
Glucose-Capillary: 74 mg/dL (ref 65–99)
Glucose-Capillary: 180 mg/dL — ABNORMAL HIGH (ref 65–99)

## 2014-11-06 LAB — CBC
HCT: 26.5 % — ABNORMAL LOW (ref 35.0–47.0)
Hemoglobin: 8.3 g/dL — ABNORMAL LOW (ref 12.0–16.0)
MCH: 25.8 pg — ABNORMAL LOW (ref 26.0–34.0)
MCHC: 31.5 g/dL — ABNORMAL LOW (ref 32.0–36.0)
MCV: 81.9 fL (ref 80.0–100.0)
PLATELETS: 186 10*3/uL (ref 150–440)
RBC: 3.23 MIL/uL — ABNORMAL LOW (ref 3.80–5.20)
RDW: 18.5 % — AB (ref 11.5–14.5)
WBC: 7.7 10*3/uL (ref 3.6–11.0)

## 2014-11-06 LAB — BASIC METABOLIC PANEL
ANION GAP: 6 (ref 5–15)
Anion gap: 6 (ref 5–15)
BUN: 27 mg/dL — AB (ref 6–20)
BUN: 24 mg/dL — ABNORMAL HIGH (ref 6–20)
CALCIUM: 7.6 mg/dL — AB (ref 8.9–10.3)
CALCIUM: 7.6 mg/dL — AB (ref 8.9–10.3)
CO2: 20 mmol/L — ABNORMAL LOW (ref 22–32)
CO2: 19 mmol/L — AB (ref 22–32)
CREATININE: 1.86 mg/dL — AB (ref 0.44–1.00)
CREATININE: 1.7 mg/dL — AB (ref 0.44–1.00)
Chloride: 104 mmol/L (ref 101–111)
Chloride: 105 mmol/L (ref 101–111)
GFR calc non Af Amer: 27 mL/min — ABNORMAL LOW (ref >60)
GFR, EST AFRICAN AMERICAN: 29 mL/min — AB (ref >60)
GFR, EST AFRICAN AMERICAN: 32 mL/min — AB (ref >60)
GFR, EST NON AFRICAN AMERICAN: 25 mL/min — AB (ref >60)
GLUCOSE: 158 mg/dL — AB (ref 65–99)
Glucose, Bld: 87 mg/dL (ref 65–99)
Potassium: 5.1 mmol/L (ref 3.5–5.1)
Potassium: 5 mmol/L (ref 3.5–5.1)
SODIUM: 130 mmol/L — AB (ref 135–145)
SODIUM: 130 mmol/L — AB (ref 135–145)

## 2014-11-06 MED ORDER — HEPARIN SODIUM (PORCINE) 5000 UNIT/ML IJ SOLN
5000.0000 [IU] | Freq: Two times a day (BID) | INTRAMUSCULAR | Status: DC
  Administered 2014-11-06 – 2014-11-09 (×6): 5000 [IU] via SUBCUTANEOUS
  Filled 2014-11-06 (×6): qty 1

## 2014-11-06 MED ORDER — MORPHINE SULFATE (PF) 2 MG/ML IV SOLN
1.0000 mg | INTRAVENOUS | Status: DC | PRN
  Administered 2014-11-06 – 2014-11-08 (×8): 1 mg via INTRAVENOUS
  Filled 2014-11-06 (×8): qty 1

## 2014-11-06 MED ORDER — VITAMIN D 1000 UNITS PO TABS
2000.0000 [IU] | ORAL_TABLET | Freq: Every day | ORAL | Status: DC
  Administered 2014-11-07 – 2014-11-09 (×3): 2000 [IU] via ORAL
  Filled 2014-11-06 (×4): qty 2

## 2014-11-06 MED ORDER — GABAPENTIN 600 MG PO TABS
600.0000 mg | ORAL_TABLET | Freq: Two times a day (BID) | ORAL | Status: DC
Start: 2014-11-06 — End: 2014-11-09
  Administered 2014-11-06 – 2014-11-09 (×7): 600 mg via ORAL
  Filled 2014-11-06 (×7): qty 1

## 2014-11-06 MED ORDER — TEMAZEPAM 15 MG PO CAPS
30.0000 mg | ORAL_CAPSULE | Freq: Every day | ORAL | Status: DC
  Administered 2014-11-06 – 2014-11-08 (×3): 30 mg via ORAL
  Filled 2014-11-06: qty 1
  Filled 2014-11-06 (×2): qty 2

## 2014-11-06 MED ORDER — DOXYCYCLINE HYCLATE 100 MG PO TABS
100.0000 mg | ORAL_TABLET | Freq: Two times a day (BID) | ORAL | Status: DC
  Administered 2014-11-06 – 2014-11-09 (×6): 100 mg via ORAL
  Filled 2014-11-06 (×6): qty 1

## 2014-11-06 MED ORDER — METRONIDAZOLE 500 MG PO TABS
500.0000 mg | ORAL_TABLET | Freq: Three times a day (TID) | ORAL | Status: DC
  Administered 2014-11-06 – 2014-11-09 (×8): 500 mg via ORAL
  Filled 2014-11-06 (×8): qty 1

## 2014-11-06 NOTE — Progress Notes (Signed)
Dr. Cleda Mccreedy with podiatry paged and asked about weight bearing orders with regards to her foot. MD gave verbal order for patient to be partial weight bearing with wedge shoe, weight/pressure on heel.

## 2014-11-06 NOTE — Progress Notes (Addendum)
Sharon [name] is a 81 y.o. female   SUBJECTIVE:  Patient recently with malleolar osteomyelitis, postop, treated with IV antibiotics, followed by infectious disease and podiatry. Was home from the skilled nursing for 1 week when she had a fall, dehydration and presented with acute renal failure, pelvic fracture and hyperkalemia, asymptomatic today, did have 3-4 bowel movements overnight post-Kayexalate.  ______________________________________________________________________  ROS: Review of systems is unremarkable for any active cardiac,respiratory, GI, GU, hematologic, neurologic or psychiatric systems, 10 systems reviewed.  Marland Kitchen amiodarone  200 mg Oral Daily  . aspirin EC  81 mg Oral Daily  . cefTRIAXone (ROCEPHIN)  IV  2 g Intravenous Q24H  . Cholecalciferol  1,000 Units Oral Daily  . docusate sodium  100 mg Oral BID  . ferrous sulfate  325 mg Oral Q breakfast  . gabapentin  600 mg Oral Daily  . glipiZIDE  2.5 mg Oral Daily  . heparin  5,000 Units Subcutaneous 3 times per day  . insulin aspart  0-5 Units Subcutaneous QHS  . insulin aspart  0-9 Units Subcutaneous TID WC  . levothyroxine  112 mcg Oral QAC breakfast  . multivitamin-lutein  1 capsule Oral Daily  . pantoprazole  40 mg Oral Daily  . simvastatin  40 mg Oral q1800  . sodium chloride  3 mL Intravenous Q12H  . temazepam  30 mg Oral QHS  . vancomycin  1,000 mg Intravenous Q36H   acetaminophen, HYDROcodone-acetaminophen, morphine injection, ondansetron **OR** ondansetron (ZOFRAN) IV   Past Medical History  Diagnosis Date  . CHF (congestive heart failure) ([place])   . Diabetes mellitus without complication ([place])   . Hyperlipidemia   . Atrial fibrillation ([place])   . Hypothyroid   . Hypertension   . Stroke ([place])   . Neuropathy ([place])   . Anemia   . Edema     feet/ankles occas  . Osteomyelitis ([place])     left first metatarsal    Past Surgical History  Procedure Laterality Date  . Hemiarthroplasty hip Right   .  Hemiarthroplasty hip Left   . Cholecystectomy    . Appendectomy    . Cardiac catheterization  08/25/13  . Coronary angioplasty    . Cataract extraction w/phaco Left 07/20/2014    Procedure: CATARACT EXTRACTION PHACO AND INTRAOCULAR LENS PLACEMENT (IOC);  Surgeon: Leandrew Koyanagi, MD;  Location: [facility] ORS;  Service: Ophthalmology;  Laterality: Left;  [place]  1:18                 AP     23.6             CDE   9.69      lot #[id number]  . Incision and drainage Left 10/11/2014    Procedure: Removal of infected tibial sessmoid;  Surgeon: Albertine Patricia, DPM;  Location: [facility] ORS;  Service: Podiatry;  Laterality: Left;    PHYSICAL EXAM:  BP 111/50 mmHg  Pulse 58  Temp(Src) 98 F (36.7 C) (Oral)  Resp 16  Ht '5\' 5"'$  (1.651 m)  Wt 66.543 kg (146 lb 11.2 oz)  BMI 24.41 kg/m2  SpO2 98%  Wt Readings from Last 3 Encounters:  11/05/14 66.543 kg (146 lb 11.2 oz)  10/25/14 57.607 kg (127 lb)  10/18/14 58.514 kg (129 lb)           BP Readings from Last 3 Encounters:  11/06/14 111/50  10/25/14 119/74  10/18/14 106/71    Constitutional: NAD Neck: supple, no thyromegaly Respiratory: CTA, no rales or wheezes Cardiovascular:  RRR, no murmur, no gallop Abdomen: soft, good BS, nontender Extremities: no edema Neuro: alert and oriented, no focal motor or sensory deficits  ASSESSMENT/PLAN:  Labs and imaging studies were reviewed  Hyperkalemia-rechecking this morning, still elevated will need further Kayexalate, nursing following, likely exacerbated by sulfa antibiotic  Acute renal failure-baseline creatinine 1.2, hydration with improvement in renal dysfunction Pelvic fracture-pain control, watch for altered mental status, physical therapy, follow hemoglobin with subcutaneous heparin Ankle osteomyelitis-infectious disease, podiatry treatment Chronic diastolic CHF-follow, compensated Diabetes mellitus-oral agents Will need SNF, mid week

## 2014-11-06 NOTE — Progress Notes (Addendum)
Patient complaining 8/10 neuropathic pain unrelieved by PRNs that have been administered. Dr. Sabra Heck paged and verbal order given to increase frequency of gabapentin to BID. Order also given for lyrica, but it appears patient is allergic to this. Dr. Sabra Heck paged again to seek alternative.   Update: 2:32 PM Dr. Sabra Heck wants to keep neuropathic pain management to just gabapentin for now, since patient reports allergy to lyrica.

## 2014-11-06 NOTE — Care Management (Signed)
Patient presents from home after sustaining fall at home.  It sounds like she tripped while ambulating with her walker and sustained a pubic rami fracture.  She has had recent stay at a skilled nursing facility and treatment for osteomyelitis left foot.  Potassium upon presentation was 6.  Physical therapy, podiatry, and ID consults are pending.  Patient is currently followed by Tretha Sciara and PT.  Agency is aware of admission

## 2014-11-06 NOTE — Evaluation (Signed)
Physical Therapy Evaluation Patient Details Name: Sharon [name] MRN: [id number] DOB: Jul 26, 1933 Today's Date: 11/06/2014   History of Present Illness  81 y.o. 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 Patient was discharged to [facility] on 10/18/2014. She says she's been feeling weak over the last couple of days, today she was ambulating with her walker and suddenly she felt weak and fell onto her left side. Denies any chest pain, loss of consciousness after the fall and denies any passing out episode. She had intense pelvic pain and left hip pain after the fall and was brought to the emergency room. X-rays here revealed a left inferior pubic ramus fracture and also her labs show hyperkalemia and acute renal failure.   Clinical Impression  Pt states that fall occurred at home when she was not using her RW; says that she used it sometimes bc she did not want to rely too heavily on it. Pt was wearing ortho wedge shoe but did not have one with her so one was provided. Pt was able to perform supine <>sit transfer with mod assist; needed assistance for progression of LLE toward EOB, control of trunk into upright position, and assist for moving hips toward EOB. Pt was mod assist for sit<>stand transfer, unable to get adequate push off from Scissors on bed to assist with standing. Pt able to maintain weight on heel of LLE during transfer, however due to inability to evenly distribute weight across the whole surface of L foot pt was unsteady when standing. Stand pivot transfer was performed x 2 (from bed to [employer] and from [place] to recliner); pt was mod assist with transfer with assistance needed for progression of RW and for progression of LLE. Pt unable to adequately  weight shift toward R side in order to provide adequate clearance of LLE due to large heel size of ortho wedge. Pt will benefit from further skilled acute PT services in order to increase functional mobility efficiency and independence and provide education on proper DME use and safety.    Follow Up Recommendations SNF    Equipment Recommendations  None recommended by PT (pt already has RW)    Recommendations for Other Services       Precautions / Restrictions Precautions Precautions: Fall;Other (comment) Precaution Comments: WBAT for L heel weight bearing in ortho wedge shoe.  Required Braces or Orthoses: Other Brace/Splint Other Brace/Splint: ortho wedge shoe (L) Restrictions Weight Bearing Restrictions: Yes LLE Weight Bearing: Weight bearing as tolerated Other Position/Activity Restrictions: Recent L pelvic fx has no weight bearing restrictions      Mobility  Bed Mobility Overal bed mobility: Needs Assistance Bed Mobility: Supine to Sit     Supine to sit: Mod assist     General bed mobility comments: mod assist for movement of LLE toward the EOB (AAROM) and assist with trunk control when coming into sitting.   Transfers Overall transfer level: Needs assistance Equipment used: Rolling walker (2 wheeled) Transfers: Sit to/from [facility] Sit to Stand: Mod assist Stand pivot transfers: Mod assist (pt requires passive movement of LLE with ortho wedge shoe; suspect pt having hard time adequately weight shifting in order to faciliate clearance of large heel on L. )       General transfer comment: pt unstable with standing;  did not effectively push off with her BUEs from bed; required mod assistance to get to full standing. Able to maintain heel weight bearing on L with orhto wedge shoe throughout transfer.   Ambulation/Gait Ambulation/Gait assistance:  (none performed due to safety concerns/ increased pain )              Stairs             Wheelchair Mobility    Modified Rankin (Stroke Patients Only)       Balance Overall balance assessment: Needs assistance Sitting-balance support: No upper extremity supported;Feet supported Sitting balance-Leahy Scale: Good     Standing balance support: Bilateral upper extremity supported;During functional activity Standing balance-Leahy Scale: Poor Standing balance comment: instances of LOB noted when first coming into standing or when performing stand pivot transfer that required min-mod assist to correct                             Pertinent Vitals/Pain Pain Assessment: 0-10 Pain Score: 9  Pain Intervention(s): Limited activity within patient's tolerance;Monitored during session;Repositioned    Home Living Family/patient expects to be discharged to:: Skilled nursing facility Living Arrangements: Alone Available Help at Discharge: Family (sons come and help her out on weekends) Type of Home: House Home Access: Level entry     Home Layout: One level Home Equipment: Environmental consultant - 2 wheels;Cane - single point      Prior Function Level of Independence: Independent with assistive device(s)               Hand Dominance        Extremity/Trunk Assessment   Upper Extremity Assessment: Overall WFL for tasks assessed           Lower Extremity Assessment: Generalized weakness (LEs generally weak; BLE gross strength 3+/5)         Communication   Communication: No difficulties  Cognition Arousal/Alertness: Awake/alert Behavior During Therapy: WFL for tasks assessed/performed Overall Cognitive Status: Within Functional Limits for tasks assessed                      General Comments      Exercises Other Exercises Other Exercises: sit<>stand and stand pivot transfers x 2 each w/ mod assist and BUE support on RW. Passively moved LLE in order to complete stand pivot transfers.       Assessment/Plan    PT Assessment Patient needs  continued PT services  PT Diagnosis Difficulty walking;Abnormality of gait;Acute pain;Generalized weakness   PT Problem List Decreased strength;Decreased activity tolerance;Decreased balance;Decreased mobility;Pain  PT Treatment Interventions DME instruction;Gait training;Stair training;Therapeutic exercise;Therapeutic activities;Balance training   PT Goals (Current goals can be found in the Care Plan section) Acute Rehab PT Goals Patient Stated Goal: to be in less pain PT Goal Formulation: With patient Time For Goal Achievement: 11/21/14 Potential to Achieve Goals: Fair    Frequency 7X/week   Barriers to discharge Decreased caregiver support      Co-evaluation               End of Session Equipment Utilized During Treatment: Gait belt;Other (comment) (ortho wedge shoe on L) Activity Tolerance: Patient limited by pain Patient left: in chair;with chair alarm set;with nursing/sitter in room Nurse Communication: Mobility status         Time: 9357-0177 PT Time Calculation (min) (ACUTE ONLY): 33 min   Charges:         PT  G CodesMilon Score 11/06/2014, 12:10 PM

## 2014-11-06 NOTE — Progress Notes (Signed)
Per aide, CBG 75.

## 2014-11-06 NOTE — Progress Notes (Signed)
Dr. Sabra Heck paged and asked if scheduled amiodarone should be given given bradycardia since admission. Also asked about more specific weight bearing orders & per MD pt is to be full weight bearing. Also inquired about ortho consult, MD declined as it is a hip fracture and treatment is conservative.

## 2014-11-06 NOTE — Consult Note (Signed)
WOC wound consult note Reason for Consult:WOund care to left foot.  Had surgery for removal infected bone 2 weeks ago. Surgery has consulted and has written nursing orders for NS dressing changes daily. Will defer to these orders and cancel WOC consult.  Will not follow at this time.  Please re-consult if needed.  Domenic Moras RN BSN [place] Pager [phone]

## 2014-11-06 NOTE — Progress Notes (Signed)
Initial Nutrition Assessment     INTERVENTION:  Meals and snacks: Cater to pt preferences, called dinner order to kitchen. Encouraged po intake   NUTRITION DIAGNOSIS:   Altered nutrition lab value related to acute illness as evidenced by  (elevated renal labs).    GOAL:   Patient will meet greater than or equal to 90% of their needs    MONITOR:    (Energy intake, Electrolyte and renal profile)  REASON FOR ASSESSMENT:   Diagnosis    ASSESSMENT:      Pt admitted with ARF improving, fall at home prior to admission with pubic ramus fracture, hyperkalemia Past Medical History  Diagnosis Date  . CHF (congestive heart failure) ([place])   . Diabetes mellitus without complication ([place])   . Hyperlipidemia   . Atrial fibrillation ([place])   . Hypothyroid   . Hypertension   . Stroke ([place])   . Neuropathy ([place])   . Anemia   . Edema     feet/ankles occas  . Osteomyelitis ([place])     left first metatarsal    Current Nutrition: tolerated lunch, ate most of mashed potatoes and vegetable, 50% of meat  Food/Nutrition-Related History: pt reports good appetite prior to admission   Scheduled Medications:  . amiodarone  200 mg Oral Daily  . aspirin EC  81 mg Oral Daily  . cefTRIAXone (ROCEPHIN)  IV  2 g Intravenous Q24H  . cholecalciferol  2,000 Units Oral Daily  . docusate sodium  100 mg Oral BID  . ferrous sulfate  325 mg Oral Q breakfast  . gabapentin  600 mg Oral BID  . glipiZIDE  2.5 mg Oral Daily  . heparin  5,000 Units Subcutaneous BID  . insulin aspart  0-5 Units Subcutaneous QHS  . insulin aspart  0-9 Units Subcutaneous TID WC  . levothyroxine  112 mcg Oral QAC breakfast  . multivitamin-lutein  1 capsule Oral Daily  . pantoprazole  40 mg Oral Daily  . simvastatin  40 mg Oral q1800  . sodium chloride  3 mL Intravenous Q12H  . temazepam  30 mg Oral QHS  . vancomycin  1,000 mg Intravenous Q36H    Continuous Medications:  . sodium chloride 50 mL/hr at 11/06/14 0847      Electrolyte/Renal Profile and Glucose Profile:   Recent Labs Lab 11/05/14 1941 11/06/14 0414 11/06/14 0946  NA 133* 130* 130*  K 6.0* 5.1 5.0  CL 106 104 105  CO2 19* 20* 19*  BUN 32* 27* 24*  CREATININE 2.19* 1.86* 1.70*  CALCIUM 7.8* 7.6* 7.6*  GLUCOSE 79 87 158*   Gastrointestinal Profile: Last BM:10/24     Weight Change: stable wt per pt, noted wt gain per wt encounters    Diet Order:  Diet heart healthy/carb modified Room service appropriate?: Yes; Fluid consistency:: Thin  Skin:   reviewed   Height:   Ht Readings from Last 1 Encounters:  11/05/14 '5\' 5"'$  (1.651 m)    Weight:   Wt Readings from Last 1 Encounters:  11/05/14 146 lb 11.2 oz (66.543 kg)    BMI:  Body mass index is 24.41 kg/(m^2).   EDUCATION NEEDS:   No education needs identified at this time  LOW Care Level  Sakib Noguez B. Zenia Resides, [place], [place] (pager)

## 2014-11-07 LAB — COMPREHENSIVE METABOLIC PANEL
ALBUMIN: 3 g/dL — AB (ref 3.5–5.0)
ALK PHOS: 98 U/L (ref 38–126)
ALT: 43 U/L (ref 14–54)
AST: 60 U/L — AB (ref 15–41)
Anion gap: 7 (ref 5–15)
BILIRUBIN TOTAL: 0.3 mg/dL (ref 0.3–1.2)
BUN: 16 mg/dL (ref 6–20)
CALCIUM: 8.2 mg/dL — AB (ref 8.9–10.3)
CO2: 22 mmol/L (ref 22–32)
Chloride: 104 mmol/L (ref 101–111)
Creatinine, Ser: 1.32 mg/dL — ABNORMAL HIGH (ref 0.44–1.00)
GFR calc Af Amer: 43 mL/min — ABNORMAL LOW (ref >60)
GFR, EST NON AFRICAN AMERICAN: 37 mL/min — AB (ref >60)
GLUCOSE: 110 mg/dL — AB (ref 65–99)
Potassium: 4.9 mmol/L (ref 3.5–5.1)
Sodium: 133 mmol/L — ABNORMAL LOW (ref 135–145)
TOTAL PROTEIN: 5.8 g/dL — AB (ref 6.5–8.1)

## 2014-11-07 LAB — CBC WITH DIFFERENTIAL/PLATELET
BASOS PCT: 3 %
Basophils Absolute: 0.1 10*3/uL (ref 0–0.1)
Eosinophils Absolute: 0.4 10*3/uL (ref 0–0.7)
Eosinophils Relative: 8 %
HEMATOCRIT: 26 % — AB (ref 35.0–47.0)
HEMOGLOBIN: 8.2 g/dL — AB (ref 12.0–16.0)
LYMPHS PCT: 15 %
Lymphs Abs: 0.9 10*3/uL — ABNORMAL LOW (ref 1.0–3.6)
MCH: 25.8 pg — ABNORMAL LOW (ref 26.0–34.0)
MCHC: 31.6 g/dL — AB (ref 32.0–36.0)
MCV: 81.6 fL (ref 80.0–100.0)
MONO ABS: 0.5 10*3/uL (ref 0.2–0.9)
MONOS PCT: 9 %
Neutro Abs: 3.8 10*3/uL (ref 1.4–6.5)
Neutrophils Relative %: 65 %
Platelets: 159 10*3/uL (ref 150–440)
RBC: 3.18 MIL/uL — ABNORMAL LOW (ref 3.80–5.20)
RDW: 18.2 % — AB (ref 11.5–14.5)
WBC: 5.8 10*3/uL (ref 3.6–11.0)

## 2014-11-07 LAB — GLUCOSE, CAPILLARY
GLUCOSE-CAPILLARY: 133 mg/dL — AB (ref 65–99)
GLUCOSE-CAPILLARY: 184 mg/dL — AB (ref 65–99)
Glucose-Capillary: 138 mg/dL — ABNORMAL HIGH (ref 65–99)
Glucose-Capillary: 96 mg/dL (ref 65–99)

## 2014-11-07 NOTE — Clinical Social Work Note (Signed)
Clinical Social Work Assessment  Patient Details  Name: Sharon [name] MRN: [id number] Date of Birth: [birth year]  Date of referral:  11/06/14               Reason for consult:  Facility Placement                Permission sought to share information with:  Facility Sport and exercise psychologist, Family Supports Permission [name] to share information::  Yes, Sharon [name]  Name::     Marketing executive::     Relationship::  son  Contact Information:     Housing/Transportation Living arrangements for the past 2 months:  [place], [place] of Information:  Patient, Medical Team, Adult Children Patient Interpreter Needed:  None Criminal Activity/Legal Involvement Pertinent to Current Situation/Hospitalization:  No - Comment as needed Significant Relationships:  Adult Children, Warehouse manager Lives with:  Self Do you feel safe going back to the place where you live?  No Need for family participation in patient care:  Yes (Comment)  Care giving concerns:  Pt discharged a week ago from [facility] where she was getting 2 wks of iv antibiotics. Pt's son states that Pt discharged "too soon"    Facilities manager / plan:  CSW was referred to Pt to assist with dc coordination. Pt is from home alone but will need STR at SNF at dc. Pt has 2 sons, Mr. [name] [place] is patient's son who is more involved with health care decisions. Pt states that both son are supportive. Pt does not want to go back to [facility]. CSW discussed options with Pt's son. They would prefer [facility] if available. CSW will know more tomorrow.   Employment status:  Retired Nurse, adult PT Recommendations:  [place] / Referral to community resources:  [place]  Patient/Family's Response to care: CSW udpated Pt's son on plan for SNF again at [employer]. CSW began bed search and will update Pt's  son tomorrow. Pt is engaged with staff, participating in treatment etc.   Patient/Family's Understanding of and Emotional Response to Diagnosis, Current Treatment, and Prognosis: Pt's family upset over Pt's fall after returning home from SNF. CSW advised Pt's son on [employer] for SNF.    Emotional Assessment Appearance:  Appears stated age Attitude/Demeanor/Rapport:   (Engaged, anxious) Affect (typically observed):  Anxious Orientation:  Oriented to Self, Oriented to Place, Oriented to  Time, Oriented to Situation Alcohol / Substance use:  Never Used Psych involvement (Current and /or in the community):  No (Comment)  Discharge Needs  Concerns to be addressed:  Adjustment to Illness, Discharge Planning Concerns Readmission within the last 30 days:  Yes Current discharge risk:  Dependent with Mobility, Lives alone, Lack of support system Barriers to Discharge:  S[employer] broker, Continued Medical Work up   [employer], LCSW 11/07/2014, 10:38 PM

## 2014-11-07 NOTE — Progress Notes (Signed)
Physical Therapy Treatment Patient Details Name: SOLITA MACADAM MRN: [id number] DOB: 1933/08/20 Today's Date: 11/07/2014    History of Present Illness 81 y.o. 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 Patient was discharged to [facility] on 10/18/2014. She says she's been feeling weak over the last couple of days, today she was ambulating with her walker and suddenly she felt weak and fell onto her left side. Denies any chest pain, loss of consciousness after the fall and denies any passing out episode. She had intense pelvic pain and left hip pain after the fall and was brought to the emergency room. X-rays here revealed a left inferior pubic ramus fracture and also her labs show hyperkalemia and acute renal failure.     PT Comments    Pt notes increased pain in left hip. Pt did not wish to participate in ambulation due to pain; pt is up in chair and wishes to remain in chair. Agreeable to exercises and participates well. Continue PT to progress strength, endurance, and all functional mobility.   Follow Up Recommendations  SNF     Equipment Recommendations  None recommended by PT    Recommendations for Other Services       Precautions / Restrictions Restrictions Weight Bearing Restrictions: Yes LLE Weight Bearing: Partial weight bearing    Mobility  Bed Mobility               General bed mobility comments: Not tested; up in chair  Transfers                 General transfer comment: Pt refused out of chair/ambulation due to pain currently  Ambulation/Gait                 Stairs            Wheelchair Mobility    Modified Rankin (Stroke Patients Only)       Balance                                    Cognition Arousal/Alertness: Awake/alert Behavior During Therapy: WFL for tasks assessed/performed Overall Cognitive Status: Within Functional Limits for tasks assessed                      Exercises General Exercises - Lower Extremity Ankle Circles/Pumps: AROM;Both;20 reps;Seated Quad Sets: Strengthening;Both;20 reps (long sit) Gluteal Sets: Strengthening;Both;20 reps (long sit) Short Arc Quad: AROM;20 reps;Left (long sit; mild resist on R) Heel Slides: AAROM;Left;15 reps (long sit partial range; AROM on R 20x) Hip ABduction/ADduction: AAROM;Left;20 reps (long sit; AROM on R) Straight Leg Raises: AAROM;10 reps;Both (long sit) Other Exercises Other Exercises: R knee flexion mild resist partial range 20x    General Comments        Pertinent Vitals/Pain Pain Assessment: 0-10 Pain Score: 8  Pain Location: L hip/proximal thigh Pain Descriptors / Indicators: Aching;Constant (increases with movement ) Pain Intervention(s): Premedicated before session;Limited activity within patient's tolerance;Monitored during session    Home Living                      Prior Function            PT Goals (  current goals can now be found in the care plan section) Progress towards PT goals: Progressing toward goals    Frequency  7X/week    PT Plan Discharge plan needs to be updated    Co-evaluation             End of Session   Activity Tolerance: Patient limited by pain;Patient tolerated treatment well Patient left: in chair;with call bell/phone within reach;with chair alarm set;with SCD's reapplied     Time: 1330-1356 PT Time Calculation (min) (ACUTE ONLY): 26 min  Charges:  $Therapeutic Exercise: 23-37 mins                    G Codes:      Charlaine Dalton 11/07/2014, 1:59 PM

## 2014-11-07 NOTE — Progress Notes (Signed)
CSW rounding on the unit, RN made her aware that patients son would like a phone call and update from her. CSW acknowledged and will call son.

## 2014-11-07 NOTE — Plan of Care (Signed)
Problem: Phase I Progression Outcomes Goal: Pain controlled with appropriate interventions Outcome: Progressing Patient called out once during shift r/t pain. Pain medicine given to control pain. Will continue to monitor.

## 2014-11-07 NOTE — Clinical Social Work Placement (Signed)
   CLINICAL SOCIAL WORK PLACEMENT  NOTE  Date:  11/07/2014  Patient Details  Name: Sharon [name] MRN: [id number] Date of Birth: Feb 07, 1933  Clinical Social Work is seeking post-discharge placement for this patient at the [place] level of care (*CSW will initial, date and re-position this form in  chart as items are completed):  Yes   Patient/family provided with [place] Work Department's list of facilities offering this level of care within the geographic area requested by the patient (or if unable, by the patient's family).  Yes   Patient/family informed of their freedom to choose among providers that offer the needed level of care, that participate in Medicare, Medicaid or managed care program needed by the patient, have an available bed and are willing to accept the patient.  Yes   Patient/family informed of [employer]'s ownership interest in [facility] and [facility], as well as of the fact that they are under no obligation to receive care at these facilities.  PASRR submitted to EDS on       PASRR number received on       Existing PASRR number confirmed on 11/07/14     FL2 transmitted to all facilities in geographic area requested by pt/family on 11/07/14     FL2 transmitted to all facilities within larger geographic area on       Patient informed that his/her managed care company has contracts with or will negotiate with certain facilities, including the following:            Patient/family informed of bed offers received.  Patient chooses bed at       Physician recommends and patient chooses bed at      Patient to be transferred to   on  .  Patient to be transferred to facility by       Patient family notified on   of transfer.  Name of family member notified:        PHYSICIAN Please sign FL2     Additional Comment:    _______________________________________________ Alonna Buckler, LCSW 11/07/2014, 10:50  PM

## 2014-11-07 NOTE — Progress Notes (Signed)
Patient ID: Sharon [name], female   DOB: 1933/05/14, 81 y.o.   MRN: [id number] Sharon [name] is a 81 y.o. female   SUBJECTIVE:  Patient recently with malleolar osteomyelitis, postop, treated with IV antibiotics, followed by infectious disease and podiatry. Was home from the skilled nursing for 1 week when she had a fall, dehydration and presented with acute renal failure, pelvic fracture and hyperkalemia, asymptomatic today, did have 3-4 bowel movements overnight post-Kayexalate. Patient asymptomatic, potassium 4.9  ______________________________________________________________________  ROS: Review of systems is unremarkable for any active cardiac,respiratory, GI, GU, hematologic, neurologic or psychiatric systems, 10 systems reviewed.  Marland Kitchen amiodarone  200 mg Oral Daily  . aspirin EC  81 mg Oral Daily  . cholecalciferol  2,000 Units Oral Daily  . docusate sodium  100 mg Oral BID  . doxycycline  100 mg Oral BID  . ferrous sulfate  325 mg Oral Q breakfast  . gabapentin  600 mg Oral BID  . glipiZIDE  2.5 mg Oral Daily  . heparin  5,000 Units Subcutaneous BID  . insulin aspart  0-5 Units Subcutaneous QHS  . insulin aspart  0-9 Units Subcutaneous TID WC  . levothyroxine  112 mcg Oral QAC breakfast  . metroNIDAZOLE  500 mg Oral 3 times per day  . multivitamin-lutein  1 capsule Oral Daily  . pantoprazole  40 mg Oral Daily  . simvastatin  40 mg Oral q1800  . sodium chloride  3 mL Intravenous Q12H  . temazepam  30 mg Oral QHS   acetaminophen, HYDROcodone-acetaminophen, morphine injection, ondansetron **OR** ondansetron (ZOFRAN) IV   Past Medical History  Diagnosis Date  . CHF (congestive heart failure) ([place])   . Diabetes mellitus without complication ([place])   . Hyperlipidemia   . Atrial fibrillation ([place])   . Hypothyroid   . Hypertension   . Stroke ([place])   . Neuropathy ([place])   . Anemia   . Edema     feet/ankles occas  . Osteomyelitis ([place])     left first metatarsal    Past  Surgical History  Procedure Laterality Date  . Hemiarthroplasty hip Right   . Hemiarthroplasty hip Left   . Cholecystectomy    . Appendectomy    . Cardiac catheterization  08/25/13  . Coronary angioplasty    . Cataract extraction w/phaco Left 07/20/2014    Procedure: CATARACT EXTRACTION PHACO AND INTRAOCULAR LENS PLACEMENT (IOC);  Surgeon: Leandrew Koyanagi, MD;  Location: [facility] ORS;  Service: Ophthalmology;  Laterality: Left;  [place]  1:18                 AP     23.6             CDE   9.69      lot #[id number]  . Incision and drainage Left 10/11/2014    Procedure: Removal of infected tibial sessmoid;  Surgeon: Albertine Patricia, DPM;  Location: [facility] ORS;  Service: Podiatry;  Laterality: Left;    PHYSICAL EXAM:  BP 159/50 mmHg  Pulse 75  Temp(Src) 98.4 F (36.9 C) (Oral)  Resp 20  Ht '5\' 5"'$  (1.651 m)  Wt 66.543 kg (146 lb 11.2 oz)  BMI 24.41 kg/m2  SpO2 94%  Wt Readings from Last 3 Encounters:  11/05/14 66.543 kg (146 lb 11.2 oz)  10/25/14 57.607 kg (127 lb)  10/18/14 58.514 kg (129 lb)           BP Readings from Last 3 Encounters:  11/07/14 159/50  10/25/14 119/74  10/18/14  106/71    Constitutional: NAD Neck: supple, no thyromegaly Respiratory: CTA, no rales or wheezes Cardiovascular: RRR, no murmur, no gallop Abdomen: soft, good BS, nontender Extremities: no edema Neuro: alert and oriented, no focal motor or sensory deficits  ASSESSMENT/PLAN:  Labs and imaging studies were reviewed  Hyperkalemia-resolved, brought on by sulfa antibiotics with renal dysfunction Acute renal failure-baseline creatinine 1.2, hydration with improvement in renal dysfunction, back to baseline Pubic ramus fracture-pain control, watch for altered mental status, physical therapy, hemoglobin stable on subcutaneous heparin Ankle osteomyelitis-back to po antibiotics, podiatry treatment Chronic diastolic CHF-follow, compensated Diabetes mellitus-oral agents Will need SNF, mid week

## 2014-11-07 NOTE — NC FL2 (Signed)
[place] LEVEL OF CARE SCREENING TOOL     IDENTIFICATION  Patient Name: Sharon [name] Birthdate: 1933-03-17 Sex: female Admission Date (Current Location): 11/05/2014  [place] and [place] Number: Educational psychologist and Address:  [facility], [address], [place], [place] [address]      Provider Number: [id number]  Attending Physician Name and Address:  Rusty Aus, MD  Relative Name and Phone Number:       Current Level of Care: Hospital Recommended Level of Care: Nursing Facility Prior Approval Number:    Date Approved/Denied:   PASRR Number:    Discharge Plan: SNF    Current Diagnoses: Patient Active Problem List   Diagnosis Date Noted  . ARF (acute renal failure) ([place]) 11/05/2014  . Diabetic foot infection ([place]) 10/10/2014  . Chronic diastolic heart failure ([place]) 05/31/2014  . HTN (hypertension) 05/31/2014  . DM (diabetes mellitus) type 2, uncontrolled, with ketoacidosis ([place]) 05/31/2014    Orientation ACTIVITIES/SOCIAL BLADDER RESPIRATION   (x4) Family supportive Continent Normal  BEHAVIORAL SYMPTOMS/MOOD NEUROLOGICAL BOWEL NUTRITION STATUS      Continent Diet (Heart Healthy )  PHYSICIAN VISITS COMMUNICATION OF NEEDS Height & Weight Skin    Verbally   146 lbs. Surgical wounds          AMBULATORY STATUS RESPIRATION    Assist extensive Normal      Personal Care Assistance Level of Assistance  Bathing, Dressing Bathing Assistance: Maximum assistance Feeding assistance: Maximum assistance Dressing Assistance: Maximum assistance      Functional Limitations Info       Ortho boot         SPECIAL CARE FACTORS FREQUENCY  PT (By licensed PT)     PT Frequency: 5             Additional Factors Info  Allergies     Allergies: Codeine, Cymbalta, Duloxetine, Lyrica           Current Medications (11/07/2014): Current Facility-Administered Medications  Medication Dose Route Frequency Provider  Last Rate Last Dose  . acetaminophen (TYLENOL) tablet 500 mg  500 mg Oral Q6H PRN Gladstone Lighter, MD      . amiodarone (PACERONE) tablet 200 mg  200 mg Oral Daily Gladstone Lighter, MD   200 mg at 11/07/14 0912  . aspirin EC tablet 81 mg  81 mg Oral Daily Gladstone Lighter, MD   81 mg at 11/07/14 0912  . cholecalciferol (VITAMIN D) tablet 2,000 Units  2,000 Units Oral Daily Rusty Aus, MD   2,000 Units at 11/07/14 0910  . docusate sodium (COLACE) capsule 100 mg  100 mg Oral BID Gladstone Lighter, MD   100 mg at 11/07/14 2124  . doxycycline (VIBRA-TABS) tablet 100 mg  100 mg Oral BID Rusty Aus, MD   100 mg at 11/07/14 2124  . ferrous sulfate tablet 325 mg  325 mg Oral Q breakfast Gladstone Lighter, MD   325 mg at 11/07/14 0909  . gabapentin (NEURONTIN) tablet 600 mg  600 mg Oral BID Rusty Aus, MD   600 mg at 11/07/14 2124  . glipiZIDE (GLUCOTROL XL) 24 hr tablet 2.5 mg  2.5 mg Oral Daily Gladstone Lighter, MD   2.5 mg at 11/07/14 0911  . heparin injection 5,000 Units  5,000 Units Subcutaneous BID Rusty Aus, MD   5,000 Units at 11/07/14 2123  . HYDROcodone-acetaminophen (NORCO/VICODIN) 5-325 MG per tablet 1-2 tablet  1-2 tablet Oral Q6H PRN Gladstone Lighter,  MD   2 tablet at 11/07/14 1930  . insulin aspart (novoLOG) injection 0-5 Units  0-5 Units Subcutaneous QHS Gladstone Lighter, MD   0 Units at 11/05/14 2200  . insulin aspart (novoLOG) injection 0-9 Units  0-9 Units Subcutaneous TID WC Gladstone Lighter, MD   1 Units at 11/07/14 1148  . levothyroxine (SYNTHROID, LEVOTHROID) tablet 112 mcg  112 mcg Oral QAC breakfast Gladstone Lighter, MD   112 mcg at 11/07/14 0909  . metroNIDAZOLE (FLAGYL) tablet 500 mg  500 mg Oral 3 times per day Rusty Aus, MD   500 mg at 11/07/14 2124  . morphine 2 MG/ML injection 1 mg  1 mg Intravenous Q4H PRN Rusty Aus, MD   1 mg at 11/07/14 1632  . multivitamin-lutein (OCUVITE-LUTEIN) capsule 1 capsule  1 capsule Oral Daily Gladstone Lighter, MD   1  capsule at 11/07/14 0913  . ondansetron (ZOFRAN) tablet 4 mg  4 mg Oral Q6H PRN Gladstone Lighter, MD       Or  . ondansetron (ZOFRAN) injection 4 mg  4 mg Intravenous Q6H PRN Gladstone Lighter, MD      . pantoprazole (PROTONIX) EC tablet 40 mg  40 mg Oral Daily Gladstone Lighter, MD   40 mg at 11/07/14 0911  . simvastatin (ZOCOR) tablet 40 mg  40 mg Oral q1800 Gladstone Lighter, MD   40 mg at 11/07/14 1810  . sodium chloride 0.9 % injection 3 mL  3 mL Intravenous Q12H Gladstone Lighter, MD   3 mL at 11/07/14 2125  . temazepam (RESTORIL) capsule 30 mg  30 mg Oral QHS Rusty Aus, MD   30 mg at 11/07/14 2124   Do not use this list as official medication orders. Please verify with discharge summary.  Discharge Medications:   Medication List    ASK your doctor about these medications        acetaminophen 500 MG tablet  Commonly known as:  TYLENOL  Take 500 mg by mouth every 4 (four) hours as needed for mild pain or moderate pain.     amiodarone 200 MG tablet  Commonly known as:  PACERONE  Take one tablet by mouth once daily for AFIB     aspirin 81 MG tablet  Take 81 mg by mouth daily.     Cholecalciferol 1000 UNITS capsule  Take 1,000 Units by mouth daily.     clindamycin 150 MG capsule  Commonly known as:  CLEOCIN  Take 300 mg by mouth 3 (three) times daily. X 7 days.  Ask about: Should I take this medication?     ferrous sulfate 325 (65 FE) MG tablet  Take 325 mg by mouth daily with breakfast.     gabapentin 600 MG tablet  Commonly known as:  NEURONTIN  Take one tablet by mouth twice daily for neuropathy     GLIPIZIDE XL 2.5 MG 24 hr tablet  Generic drug:  glipiZIDE  Take 2.5 mg by mouth daily.     glyBURIDE-metformin 5-500 MG tablet  Commonly known as:  GLUCOVANCE  Take 1 tablet by mouth daily with breakfast.     HYDROcodone-acetaminophen 5-325 MG tablet  Commonly known as:  NORCO/VICODIN  Take 1-2 tablets by mouth every 6 (six) hours as needed. For pain      levothyroxine 112 MCG tablet  Commonly known as:  SYNTHROID, LEVOTHROID  Take one tablet by mouth once daily 30 minutes before breakfast once daily for hypothyroid     lisinopril 10  MG tablet  Commonly known as:  PRINIVIL,ZESTRIL  Take 10 mg by mouth daily.     metoprolol succinate 100 MG 24 hr tablet  Commonly known as:  TOPROL-XL  Take 100 mg by mouth daily.     metoprolol tartrate 25 MG tablet  Commonly known as:  LOPRESSOR  Take 25 mg by mouth daily.     potassium chloride 8 MEQ tablet  Commonly known as:  KLOR-CON  Take 8 mEq by mouth daily.     PRESERVISION AREDS 2 PO  Take 2 tablets by mouth daily.     simvastatin 40 MG tablet  Commonly known as:  ZOCOR  Take one tablet by mouth once daily at bedtime for cholesterol     sulfamethoxazole-trimethoprim 800-160 MG tablet  Commonly known as:  BACTRIM DS,SEPTRA DS  Take 1 tablet by mouth 2 (two) times daily.     temazepam 15 MG capsule  Commonly known as:  RESTORIL  Take 15 mg by mouth at bedtime as needed for sleep.        Relevant Imaging Results:  Relevant Lab Results:  Recent Labs    Additional [place], LCSW

## 2014-11-08 LAB — POTASSIUM: Potassium: 4.8 mmol/L (ref 3.5–5.1)

## 2014-11-08 LAB — GLUCOSE, CAPILLARY
GLUCOSE-CAPILLARY: 139 mg/dL — AB (ref 65–99)
GLUCOSE-CAPILLARY: 145 mg/dL — AB (ref 65–99)
GLUCOSE-CAPILLARY: 144 mg/dL — AB (ref 65–99)
GLUCOSE-CAPILLARY: 59 mg/dL — AB (ref 65–99)
GLUCOSE-CAPILLARY: 100 mg/dL — AB (ref 65–99)

## 2014-11-08 MED ORDER — DOXYCYCLINE HYCLATE 100 MG PO TABS: 100.0000 mg | ORAL_TABLET | Freq: Two times a day (BID) | ORAL | Status: DC

## 2014-11-08 MED ORDER — BISACODYL 5 MG PO TBEC: 5.0000 mg | DELAYED_RELEASE_TABLET | Freq: Every day | ORAL | Status: DC

## 2014-11-08 MED ORDER — TEMAZEPAM 30 MG PO CAPS: 30.0000 mg | ORAL_CAPSULE | Freq: Every day | ORAL | Status: DC

## 2014-11-08 MED ORDER — CAPSAICIN 0.025 % EX CREA: TOPICAL_CREAM | Freq: Two times a day (BID) | CUTANEOUS | Status: DC

## 2014-11-08 MED ORDER — PANTOPRAZOLE SODIUM 40 MG PO TBEC: 40.0000 mg | DELAYED_RELEASE_TABLET | Freq: Every day | ORAL | Status: DC

## 2014-11-08 MED ORDER — CAPSAICIN 0.025 % EX CREA
TOPICAL_CREAM | Freq: Two times a day (BID) | CUTANEOUS | Status: DC
Start: 2014-11-08 — End: 2014-11-09
  Administered 2014-11-08 – 2014-11-09 (×3): via TOPICAL
  Filled 2014-11-08: qty 60

## 2014-11-08 MED ORDER — BISACODYL 5 MG PO TBEC
5.0000 mg | DELAYED_RELEASE_TABLET | Freq: Every day | ORAL | Status: DC
  Administered 2014-11-08 – 2014-11-09 (×2): 5 mg via ORAL
  Filled 2014-11-08 (×2): qty 1

## 2014-11-08 MED ORDER — METRONIDAZOLE 500 MG PO TABS: 500.0000 mg | ORAL_TABLET | Freq: Three times a day (TID) | ORAL | Status: DC

## 2014-11-08 MED ORDER — CHOLECALCIFEROL 50 MCG (2000 UT) PO TABS: 2000.0000 [IU] | ORAL_TABLET | Freq: Every day | ORAL | Status: DC

## 2014-11-08 NOTE — Clinical Social Work Placement (Signed)
   CLINICAL SOCIAL WORK PLACEMENT  NOTE  Date:  11/08/2014  Patient Details  Name: Sharon [name] MRN: [id number] Date of Birth: [birth year]  Clinical Social Work is seeking post-discharge placement for this patient at the [place] level of care (*CSW will initial, date and re-position this form in  chart as items are completed):  Yes   Patient/family provided with [place] Work Department's list of facilities offering this level of care within the geographic area requested by the patient (or if unable, by the patient's family).  Yes   Patient/family informed of their freedom to choose among providers that offer the needed level of care, that participate in Medicare, Medicaid or managed care program needed by the patient, have an available bed and are willing to accept the patient.  Yes   Patient/family informed of [facility]'s ownership interest in [facility] and [facility], as well as of the fact that they are under no obligation to receive care at these facilities.  PASRR submitted to EDS on       PASRR number received on       Existing PASRR number confirmed on 11/07/14     FL2 transmitted to all facilities in geographic area requested by pt/family on 11/07/14     FL2 transmitted to all facilities within larger geographic area on       Patient informed that his/her managed care company has contracts with or will negotiate with certain facilities, including the following:        Yes   Patient/family informed of bed offers received.  Patient chooses bed at [facility]     Physician recommends and patient chooses bed at      Patient to be transferred to [facility] on  .  Patient to be transferred to facility by EMS     Patient family notified on   of transfer.  Name of family member notified:        PHYSICIAN       Additional Comment:    _______________________________________________ Alonna Buckler,  LCSW 11/08/2014, 1:19 PM

## 2014-11-08 NOTE — Progress Notes (Signed)
Clinical faxed to Bernadene Bell [phone]) to begin SNF auth process.  Received call from [place] with [employer] to confirm they received it.

## 2014-11-08 NOTE — Progress Notes (Signed)
Patient rested quietly for most of the night with minimal complaints of pain. Patient was noted to be sad and tearful earlier during the shift and emotional support was given. No further complaints and no signs of discomfort or distress noted. Nursing staff will continue to monitor. Earleen Reaper, RN

## 2014-11-08 NOTE — Care Management Important Message (Signed)
Important Message  Patient Details  Name: Sharon [name] MRN: [id number] Date of Birth: [birth year]   Medicare Important Message Given:  Yes-second notification given    Katrina Stack, RN 11/08/2014, 1:22 PM

## 2014-11-08 NOTE — Progress Notes (Signed)
Physical Therapy Treatment Patient Details Name: Sharon [name] MRN: [id number] DOB: 17-Mar-1933 Today's Date: 11/08/2014    History of Present Illness 81 y.o. 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 Patient was discharged to [facility] on 10/18/2014. She says she's been feeling weak over the last couple of days, today she was ambulating with her walker and suddenly she felt weak and fell onto her left side. Denies any chest pain, loss of consciousness after the fall and denies any passing out episode. She had intense pelvic pain and left hip pain after the fall and was brought to the emergency room. X-rays here revealed a left inferior pubic ramus fracture and also her labs show hyperkalemia and acute renal failure.     PT Comments    Pt c/o of increased severity of L hip pain upon arrival to room today. Pt demonstrated good motivation to perform therapy and cooperated with all activity requested of her. Pt was able to initiate LE sequencing toward EOB independently when told to sit on side of bed. Still requires mod assist for adduction of LLE and control of trunk into upright seated posture. Pt able to maintain seated on EOB position without UE support with feet on ground. Pt needed to be reminded of her WB precautions from her previous foot sx prior to standing. Ortho wedge shoe was donned and pt completed sit<>stand transfer with mod assist and required vc for hand placement on bed prior to transfer. Pt able to maintain WB through heel throughout sit<>stand. Stand pivot transfer was then performed to place pt in chair. Pt requires max assist for progression of walker in correct direction and advancement of LLE due to inadequate ability to clear foot  with large ortho wedge shoe. Pt is extremely fearful of falling and anxious when up on feet. Tends to shuffle or slide R foot along as opposed to taking steps. Pt requires further skilled acute PT services in order to increase functional mobility and gait performance.   Follow Up Recommendations  SNF     Equipment Recommendations       Recommendations for Other Services       Precautions / Restrictions Precautions Precautions: Fall;Other (comment) Precaution Comments: WBAT for L heel weight bearing in ortho wedge shoe.  Required Braces or Orthoses: Other Brace/Splint Other Brace/Splint: ortho wedge shoe (L) Restrictions Weight Bearing Restrictions: Yes LLE Weight Bearing: L ankle is PWB (heel weight bearing only)  Other Position/Activity Restrictions: Recent L pelvic fx has no weight bearing restrictions    Mobility  Bed Mobility Overal bed mobility: Needs Assistance Bed Mobility: Supine to Sit     Supine to sit: Mod assist     General bed mobility comments: pt able to sequence LEs toward EOB with assistance needed for adduction of LLE. Pt requires vc for reaching across body with arm and grabbing hand rail for pulling trunk into sitting. Even with vc pt unable to pull herself up independently and requires mod assist for trunk control.   Transfers Overall transfer level: Needs assistance Equipment used: Rolling walker (2 wheeled) Transfers: Sit to/from [facility] Sit to Stand: Min assist;Mod assist Stand pivot transfers: Max assist       General transfer comment: Pt able to perform sit<>stand transfer with vc for hand placement  on bed. Able to maintain weight bearing status through L heel during transfer after reminder about precautions. Stand pivot transfer still gives pt considerable difficulty due to large size of heel on the ortho wedge shoe. Pt has hard time initiating steps for performance of transfer and requires PT assist for progression of LLE.    Ambulation/Gait Ambulation/Gait assistance:  (not performed)               Stairs            Wheelchair Mobility    Modified Rankin (Stroke Patients Only)       Balance Overall balance assessment: Needs assistance Sitting-balance support: No upper extremity supported;Feet supported Sitting balance-Leahy Scale: Good     Standing balance support: Bilateral upper extremity supported;During functional activity Standing balance-Leahy Scale: Poor Standing balance comment: signifant forward trunk lean noted. Pt's L knee held in considerable amount of flexion when standing due to LLD that ortho wedge shoe creates.                     Cognition Arousal/Alertness: Awake/alert Behavior During Therapy: WFL for tasks assessed/performed Overall Cognitive Status: Within Functional Limits for tasks assessed                      Exercises Total Joint Exercises Ankle Circles/Pumps: AROM;Both;10 reps;Supine Gluteal Sets: Strengthening;Both;10 reps;Supine Towel Squeeze: Strengthening;Both;10 reps;Supine Heel Slides: AROM;Strengthening;Left;10 reps;Supine Hip ABduction/ADduction: AAROM;Left;10 reps;Supine    General Comments        Pertinent Vitals/Pain Pain Assessment: 0-10 Pain Score: 8  Pain Location: L hip Pain Intervention(s): Monitored during session;Repositioned    Home Living                      Prior Function            PT Goals (current goals can now be found in the care plan section) Acute Rehab PT Goals Patient Stated Goal: to be in less pain PT Goal Formulation: With patient Time For Goal Achievement: 11/21/14 Potential to Achieve Goals: Fair Progress towards PT goals: Progressing toward goals    Frequency  7X/week    PT Plan Current plan remains appropriate    Co-evaluation             End of Session Equipment Utilized During Treatment: Gait belt;Other (comment) (ortho wedge shoe on L) Activity Tolerance:  Patient tolerated treatment well Patient left: in chair;with nursing/sitter in room;with call bell/phone within reach;with chair alarm set     Time: 7793-9030 PT Time Calculation (min) (ACUTE ONLY): 26 min  Charges:                       G CodesMilon Score 11/08/2014, 11:48 AM

## 2014-11-08 NOTE — Progress Notes (Signed)
Patient ID: Sharon [name], female   DOB: 1933/11/08, 81 y.o.   MRN: [id number] Patient ID: Sharon [name], female   DOB: 09-14-1933, 81 y.o.   MRN: [id number] Sharon [name] is a 81 y.o. female   SUBJECTIVE:  Patient recently with malleolar osteomyelitis, postop, treated with IV antibiotics, followed by infectious disease and podiatry. Was home from the skilled nursing for 1 week when she had a fall, dehydration and presented with acute renal failure, pelvic fracture and hyperkalemia. No complaints today ______________________________________________________________________  ROS: Review of systems is unremarkable for any active cardiac,respiratory, GI, GU, hematologic, neurologic or psychiatric systems, 10 systems reviewed.  Marland Kitchen amiodarone  200 mg Oral Daily  . aspirin EC  81 mg Oral Daily  . cholecalciferol  2,000 Units Oral Daily  . docusate sodium  100 mg Oral BID  . doxycycline  100 mg Oral BID  . ferrous sulfate  325 mg Oral Q breakfast  . gabapentin  600 mg Oral BID  . glipiZIDE  2.5 mg Oral Daily  . heparin  5,000 Units Subcutaneous BID  . insulin aspart  0-5 Units Subcutaneous QHS  . insulin aspart  0-9 Units Subcutaneous TID WC  . levothyroxine  112 mcg Oral QAC breakfast  . metroNIDAZOLE  500 mg Oral 3 times per day  . multivitamin-lutein  1 capsule Oral Daily  . pantoprazole  40 mg Oral Daily  . simvastatin  40 mg Oral q1800  . sodium chloride  3 mL Intravenous Q12H  . temazepam  30 mg Oral QHS   acetaminophen, HYDROcodone-acetaminophen, morphine injection, ondansetron **OR** ondansetron (ZOFRAN) IV   Past Medical History  Diagnosis Date  . CHF (congestive heart failure) ([place])   . Diabetes mellitus without complication ([place])   . Hyperlipidemia   . Atrial fibrillation ([place])   . Hypothyroid   . Hypertension   . Stroke ([place])   . Neuropathy ([place])   . Anemia   . Edema     feet/ankles occas  . Osteomyelitis ([place])     left first metatarsal    Past Surgical History   Procedure Laterality Date  . Hemiarthroplasty hip Right   . Hemiarthroplasty hip Left   . Cholecystectomy    . Appendectomy    . Cardiac catheterization  08/25/13  . Coronary angioplasty    . Cataract extraction w/phaco Left 07/20/2014    Procedure: CATARACT EXTRACTION PHACO AND INTRAOCULAR LENS PLACEMENT (IOC);  Surgeon: Leandrew Koyanagi, MD;  Location: [facility] ORS;  Service: Ophthalmology;  Laterality: Left;  [place]  1:18                 AP     23.6             CDE   9.69      lot #[id number]  . Incision and drainage Left 10/11/2014    Procedure: Removal of infected tibial sessmoid;  Surgeon: Albertine Patricia, DPM;  Location: [facility] ORS;  Service: Podiatry;  Laterality: Left;    PHYSICAL EXAM:  BP 113/63 mmHg  Pulse 72  Temp(Src) 98.3 F (36.8 C) (Oral)  Resp 16  Ht '5\' 5"'$  (1.651 m)  Wt 66.543 kg (146 lb 11.2 oz)  BMI 24.41 kg/m2  SpO2 93%  Wt Readings from Last 3 Encounters:  11/05/14 66.543 kg (146 lb 11.2 oz)  10/25/14 57.607 kg (127 lb)  10/18/14 58.514 kg (129 lb)           BP Readings from Last 3 Encounters:  11/08/14 113/63  10/25/14 119/74  10/18/14 106/71    Constitutional: NAD Neck: supple, no thyromegaly Respiratory: CTA, no rales or wheezes Cardiovascular: RRR, no murmur, no gallop Abdomen: soft, good BS, nontender Extremities: no edema Neuro: alert and oriented, no focal motor or sensory deficits  ASSESSMENT/PLAN:  Labs and imaging studies were reviewed  Hyperkalemia-resolved, brought on by sulfa antibiotics with renal dysfunction Acute renal failure-baseline creatinine 1.2, hydration with improvement in renal dysfunction, back to baseline Pubic ramus fracture-pain control, watch for altered mental status, physical therapy, hemoglobin stable on subcutaneous heparin Ankle osteomyelitis-back to po antibiotics, podiatry treatment Chronic diastolic CHF-follow, compensated Diabetes mellitus-oral agents Patient prefers [facility], tomorrow

## 2014-11-08 NOTE — Progress Notes (Signed)
Received call from [facility], rep with [employer] [phone]) with SNF approval.  Josem Kaufmann is [phone] with start date 11/09/14.  Next review date set for 11/11/14.  Per Varney Biles, she will relay auth to [employer]

## 2014-11-08 NOTE — Progress Notes (Signed)
CBG 59.  Pt denies S/S hypoglycemia.  Protocol initiated, 4 ounces of juice and graham crackers given as HS snack.  Pt reports not eating from dinner tray earlier due to not liking food that was sent.

## 2014-11-08 NOTE — Progress Notes (Signed)
CSW confirmed private room at [facility] for Thursday. Pt's son [name] is in agreement and will be at the hospital around 10 to assist as needed with tranfer. Pt will go via EMS. CSW received insurance authorization for STR starting 10/27.  CSW spoke to with regarding her adjustment to the illness and SNF. Pt reports that she has had a depressed mood for a few weeks since she started getting sick. She shared that she has lost interest and motivation for some of the things that she enjoyed doing like going to church. She states that she would plan to go on Saturday but then decide to stay in bed on Sunday morning. Pt stated asking CSW how long she will be sick etc. CSW discussed with Pt talking to MD about increased depression and options for treating it. CSW stated that I would leave a note, and Pt stated "I wish you would."   Pt encouraged talking to her friends about coming over for lunch like they used to have while Pt is in [place] at [place].   CSW will follow up to assist with dc on Thursday.   Toma Copier, [place]

## 2014-11-08 NOTE — Progress Notes (Signed)
Alert and oriented. Sinus rhythm on tele. BP is borderline low in the 90's, looks like this has been a trend. Main complaint has been pain in her left hip/side. Patient also started complaining of intense foot pain in her left toes which made her cry. MD was paged at that time and zostrix cream that patient uses at home was ordered, some relief. Morphine and percocet both given for pain along with one dose of tylenol, minimal improvement. MD is aware. Patient got up to chair once today and stayed of over an hour for lunch. Will continue to monitor.

## 2014-11-08 NOTE — Care Management (Signed)
K is stable today.  Patient required  IV med x 3 10/25 for pain in addition to oral pain meds.  Anticipate patient will discharge to [facility]  10/27.  Josem Kaufmann is in progress

## 2014-11-09 ENCOUNTER — Encounter
Admission: RE | Admit: 2014-11-09 | Discharge: 2014-11-09 | Disposition: A | Discharge status: Home / Self Care | Payer: Medicare Other | Source: Ambulatory Visit | Attending: Internal Medicine | Admitting: Internal Medicine

## 2014-11-09 DIAGNOSIS — E119 Type 2 diabetes mellitus without complications: Secondary | ICD-10-CM | POA: Insufficient documentation

## 2014-11-09 DIAGNOSIS — Z794 Long term (current) use of insulin: Secondary | ICD-10-CM | POA: Insufficient documentation

## 2014-11-09 LAB — GLUCOSE, CAPILLARY: Glucose-Capillary: 201 mg/dL — ABNORMAL HIGH (ref 65–99)

## 2014-11-09 NOTE — Progress Notes (Signed)
CSW was notified that Pt has been medically cleared for dc to [facility]. CSW spoke to Pt's son who stated that he would meet his mother at SNF after going to her home to get clothes. CSW updated Pt with plan. CSW sent dc packet to facility electronically. RN to call report to SNF and ems for transport.   Toma Copier, [place]

## 2014-11-09 NOTE — Progress Notes (Signed)
Report called to [facility]/ iv and tele removed/ EMS called to transport

## 2014-11-09 NOTE — Discharge Summary (Signed)
Sharon [name], is a 81 y.o. female  DOB 1933-05-23  MRN [id number].  Admission date:  11/05/2014  Admitting Physician  Gladstone Lighter, MD  Discharge Date:  11/09/2014    Admission Diagnosis  Hyperkalemia [E87.5] Bradycardia [R00.1] Left hip pain [M25.552] Inferior pubic ramus fracture, left, closed, initial encounter [facility]) [S32.592A] Acute renal failure, unspecified acute renal failure type [facility]) [N17.9]  Discharge Diagnoses    Inferior pubic ramus fracture Acute renal failure with hyperkalemia Chronic atrial fibrillation Chronic diastolic congestive heart failure Diabetes mellitus, non-insulin-requiring Severe peripheral neuropathy Left first metatarsal osteomyelitis, MRSA   Past Medical History  Diagnosis Date  . CHF (congestive heart failure) ([place])   . Diabetes mellitus without complication ([place])   . Hyperlipidemia   . Atrial fibrillation ([place])   . Hypothyroid   . Hypertension   . Stroke ([place])   . Neuropathy ([place])   . Anemia   . Edema     feet/ankles occas  . Osteomyelitis ([place])     left first metatarsal    Past Surgical History  Procedure Laterality Date  . Hemiarthroplasty hip Right   . Hemiarthroplasty hip Left   . Cholecystectomy    . Appendectomy    . Cardiac catheterization  08/25/13  . Coronary angioplasty    . Cataract extraction w/phaco Left 07/20/2014    Procedure: CATARACT EXTRACTION PHACO AND INTRAOCULAR LENS PLACEMENT (IOC);  Surgeon: Leandrew Koyanagi, MD;  Location: [facility] ORS;  Service: Ophthalmology;  Laterality: Left;  [place]  1:18                 AP     23.6             CDE   9.69      lot #[id number]  . Incision and drainage Left 10/11/2014    Procedure: Removal of infected tibial sessmoid;  Surgeon: Albertine Patricia, DPM;  Location: [facility] ORS;  Service: Podiatry;  Laterality: Left;       History of present illness and  Hospital Course:     Kindly see H&P  for history of present illness and admission details, please review complete Labs, Consult reports and Test reports for all details in brief  HPI  from the history and physical done on the day of admission    Hospital Course    Patient was admitted with severe hyperkalemia and renal failure. The sulfa antibiotic was discontinued and her potassium normalized with hydration as did her renal function. She was found to have a inferior pubic ramus fracture and notes some pain on ambulation. She continues in A. fib. Her sugars were controlled. For her osteomyelitis she was switched to doxycycline/Flagyl per ID. Due to weakness she is going to skilled nursing, [facility].   Discharge Condition: Stable   Follow UP  Dr. Sabra Heck 3 weeks    Discharge Instructions  and  Discharge Medications   Tylenol 500 mg every 4 when necessary pain Amiodarone 200 mg daily Aspirin 81 mg daily Dulcolax 5 mg daily Zostrix cream 0.025%  topical twice a day to feet Vitamin D 2000 his daily Doxycycline 100 mg twice a day Ferrous sulfate 325 mg daily Gabapentin 600 mg twice a day Glipizide XL 2.5 mg daily  Synthroid 112 micrograms daily Lisinopril 10 mg daily Metronidazole 500 mg 3 times a day Pantoprazole 40 mg daily Preservation tabs 2 tabs daily Simvastatin 40 mg at bedtime Temazepam 30 mg at bedtime    Today   Subjective:   Sharon [name] today is stable for discharge to skilled nursing .   Objective:   Blood pressure 133/46, pulse 63, temperature 98.7 F (37.1 C), temperature source Oral, resp. rate 16, height '5\' 5"'$  (1.651 m), weight 66.543 kg (146 lb 11.2 oz), SpO2 97 %.   Exam Awake Alert, Oriented x 3, No new F.N deficits, Normal affect [place].AT,PERRAL Supple Neck,No JVD, No cervical lymphadenopathy appriciated.  Symmetrical Chest wall movement, Good air movement bilaterally, CTAB RRR,No Gallops,Rubs or new Murmurs, Abd Soft, Non tender, No rebound. No Cyanosis, Clubbing or edema, No new  Rash or bruise  Total Time in preparing paper work, data evaluation and todays exam - 35 minutes  Quinisha Mould F. M.D on 11/09/2014 at 8:07 AM

## 2014-11-09 NOTE — Clinical Social Work Placement (Signed)
   CLINICAL SOCIAL WORK PLACEMENT  NOTE  Date:  11/09/2014  Patient Details  Name: Sharon [name] MRN: [id number] Date of Birth: Nov 29, 1933  Clinical Social Work is seeking post-discharge placement for this patient at the [place] level of care (*CSW will initial, date and re-position this form in  chart as items are completed):  Yes   Patient/family provided with [facility] Work Department's list of facilities offering this level of care within the geographic area requested by the patient (or if unable, by the patient's family).  Yes   Patient/family informed of their freedom to choose among providers that offer the needed level of care, that participate in Medicare, Medicaid or managed care program needed by the patient, have an available bed and are willing to accept the patient.  Yes   Patient/family informed of [facility]'s ownership interest in [facility] and [facility], as well as of the fact that they are under no obligation to receive care at these facilities.  PASRR submitted to EDS on       PASRR number received on       Existing PASRR number confirmed on 11/07/14     FL2 transmitted to all facilities in geographic area requested by pt/family on 11/07/14     FL2 transmitted to all facilities within larger geographic area on       Patient informed that his/her managed care company has contracts with or will negotiate with certain facilities, including the following:        Yes   Patient/family informed of bed offers received.  Patient chooses bed at [facility]     Physician recommends and patient chooses bed at      Patient to be transferred to [facility] on 11/09/14.  Patient to be transferred to facility by EMS     Patient family notified on 11/09/14 of transfer.  Name of family member notified:  son [facility]     PHYSICIAN       Additional Comment:     _______________________________________________ Alonna Buckler, LCSW 11/09/2014, 10:22 PM

## 2014-11-09 NOTE — Plan of Care (Signed)
Problem: Phase I Progression Outcomes Goal: Pain controlled with appropriate interventions Outcome: Progressing Continues to report pain to left foot, intermittent relief with alternation of po/IV pain meds.

## 2014-11-10 DIAGNOSIS — Z794 Long term (current) use of insulin: Secondary | ICD-10-CM | POA: Diagnosis not present

## 2014-11-10 DIAGNOSIS — E119 Type 2 diabetes mellitus without complications: Secondary | ICD-10-CM | POA: Diagnosis not present

## 2014-11-10 LAB — GLUCOSE, CAPILLARY
GLUCOSE-CAPILLARY: 122 mg/dL — AB (ref 65–99)
GLUCOSE-CAPILLARY: 157 mg/dL — AB (ref 65–99)
Glucose-Capillary: 62 mg/dL — ABNORMAL LOW (ref 65–99)
Glucose-Capillary: 198 mg/dL — ABNORMAL HIGH (ref 65–99)
Glucose-Capillary: 154 mg/dL — ABNORMAL HIGH (ref 65–99)

## 2014-11-11 DIAGNOSIS — E119 Type 2 diabetes mellitus without complications: Secondary | ICD-10-CM | POA: Diagnosis not present

## 2014-11-11 LAB — GLUCOSE, CAPILLARY
GLUCOSE-CAPILLARY: 111 mg/dL — AB (ref 65–99)
GLUCOSE-CAPILLARY: 157 mg/dL — AB (ref 65–99)
GLUCOSE-CAPILLARY: 115 mg/dL — AB (ref 65–99)
Glucose-Capillary: 107 mg/dL — ABNORMAL HIGH (ref 65–99)

## 2014-11-12 DIAGNOSIS — E119 Type 2 diabetes mellitus without complications: Secondary | ICD-10-CM | POA: Diagnosis not present

## 2014-11-12 LAB — GLUCOSE, CAPILLARY
GLUCOSE-CAPILLARY: 79 mg/dL (ref 65–99)
Glucose-Capillary: 95 mg/dL (ref 65–99)
Glucose-Capillary: 134 mg/dL — ABNORMAL HIGH (ref 65–99)
Glucose-Capillary: 134 mg/dL — ABNORMAL HIGH (ref 65–99)

## 2014-11-13 DIAGNOSIS — E119 Type 2 diabetes mellitus without complications: Secondary | ICD-10-CM | POA: Diagnosis not present

## 2014-11-13 LAB — GLUCOSE, CAPILLARY
GLUCOSE-CAPILLARY: 145 mg/dL — AB (ref 65–99)
GLUCOSE-CAPILLARY: 99 mg/dL (ref 65–99)
Glucose-Capillary: 70 mg/dL (ref 65–99)
Glucose-Capillary: 135 mg/dL — ABNORMAL HIGH (ref 65–99)

## 2014-11-14 ENCOUNTER — Encounter
Admission: RE | Admit: 2014-11-14 | Discharge: 2014-11-14 | Disposition: A | Discharge status: Home / Self Care | Payer: Medicare Other | Source: Ambulatory Visit | Attending: Internal Medicine | Admitting: Internal Medicine

## 2014-11-14 DIAGNOSIS — E119 Type 2 diabetes mellitus without complications: Secondary | ICD-10-CM | POA: Insufficient documentation

## 2014-11-14 DIAGNOSIS — Z794 Long term (current) use of insulin: Secondary | ICD-10-CM | POA: Diagnosis not present

## 2014-11-15 LAB — GLUCOSE, CAPILLARY
GLUCOSE-CAPILLARY: 78 mg/dL (ref 65–99)
GLUCOSE-CAPILLARY: 157 mg/dL — AB (ref 65–99)
GLUCOSE-CAPILLARY: 88 mg/dL (ref 65–99)
GLUCOSE-CAPILLARY: 129 mg/dL — AB (ref 65–99)
Glucose-Capillary: 100 mg/dL — ABNORMAL HIGH (ref 65–99)
Glucose-Capillary: 215 mg/dL — ABNORMAL HIGH (ref 65–99)
Glucose-Capillary: 165 mg/dL — ABNORMAL HIGH (ref 65–99)

## 2014-11-16 DIAGNOSIS — E119 Type 2 diabetes mellitus without complications: Secondary | ICD-10-CM | POA: Diagnosis not present

## 2014-11-16 LAB — GLUCOSE, CAPILLARY
GLUCOSE-CAPILLARY: 220 mg/dL — AB (ref 65–99)
GLUCOSE-CAPILLARY: 131 mg/dL — AB (ref 65–99)
GLUCOSE-CAPILLARY: 154 mg/dL — AB (ref 65–99)
Glucose-Capillary: 106 mg/dL — ABNORMAL HIGH (ref 65–99)

## 2014-11-17 DIAGNOSIS — E119 Type 2 diabetes mellitus without complications: Secondary | ICD-10-CM | POA: Diagnosis not present

## 2014-11-17 LAB — GLUCOSE, CAPILLARY
GLUCOSE-CAPILLARY: 105 mg/dL — AB (ref 65–99)
GLUCOSE-CAPILLARY: 143 mg/dL — AB (ref 65–99)
GLUCOSE-CAPILLARY: 180 mg/dL — AB (ref 65–99)
Glucose-Capillary: 129 mg/dL — ABNORMAL HIGH (ref 65–99)

## 2014-11-18 DIAGNOSIS — E119 Type 2 diabetes mellitus without complications: Secondary | ICD-10-CM | POA: Diagnosis not present

## 2014-11-18 LAB — GLUCOSE, CAPILLARY: GLUCOSE-CAPILLARY: 89 mg/dL (ref 65–99)

## 2014-11-19 LAB — GLUCOSE, CAPILLARY
Glucose-Capillary: 171 mg/dL — ABNORMAL HIGH (ref 65–99)
Glucose-Capillary: 92 mg/dL (ref 65–99)

## 2014-11-20 DIAGNOSIS — E119 Type 2 diabetes mellitus without complications: Secondary | ICD-10-CM | POA: Diagnosis not present

## 2014-11-20 LAB — GLUCOSE, CAPILLARY
GLUCOSE-CAPILLARY: 71 mg/dL (ref 65–99)
GLUCOSE-CAPILLARY: 249 mg/dL — AB (ref 65–99)
GLUCOSE-CAPILLARY: 84 mg/dL (ref 65–99)
Glucose-Capillary: 79 mg/dL (ref 65–99)
Glucose-Capillary: 154 mg/dL — ABNORMAL HIGH (ref 65–99)
Glucose-Capillary: 87 mg/dL (ref 65–99)
Glucose-Capillary: 96 mg/dL (ref 65–99)

## 2014-11-21 DIAGNOSIS — E119 Type 2 diabetes mellitus without complications: Secondary | ICD-10-CM | POA: Diagnosis not present

## 2014-11-21 LAB — GLUCOSE, CAPILLARY
GLUCOSE-CAPILLARY: 217 mg/dL — AB (ref 65–99)
GLUCOSE-CAPILLARY: 179 mg/dL — AB (ref 65–99)
Glucose-Capillary: 93 mg/dL (ref 65–99)
Glucose-Capillary: 76 mg/dL (ref 65–99)

## 2014-11-22 DIAGNOSIS — E119 Type 2 diabetes mellitus without complications: Secondary | ICD-10-CM | POA: Diagnosis not present

## 2014-11-22 LAB — GLUCOSE, CAPILLARY
GLUCOSE-CAPILLARY: 78 mg/dL (ref 65–99)
GLUCOSE-CAPILLARY: 148 mg/dL — AB (ref 65–99)
Glucose-Capillary: 188 mg/dL — ABNORMAL HIGH (ref 65–99)
Glucose-Capillary: 146 mg/dL — ABNORMAL HIGH (ref 65–99)

## 2014-11-23 DIAGNOSIS — E119 Type 2 diabetes mellitus without complications: Secondary | ICD-10-CM | POA: Diagnosis not present

## 2014-11-23 LAB — GLUCOSE, CAPILLARY
GLUCOSE-CAPILLARY: 94 mg/dL (ref 65–99)
GLUCOSE-CAPILLARY: 136 mg/dL — AB (ref 65–99)
Glucose-Capillary: 96 mg/dL (ref 65–99)

## 2014-11-24 DIAGNOSIS — E119 Type 2 diabetes mellitus without complications: Secondary | ICD-10-CM | POA: Diagnosis not present

## 2014-11-24 LAB — GLUCOSE, CAPILLARY
GLUCOSE-CAPILLARY: 106 mg/dL — AB (ref 65–99)
Glucose-Capillary: 219 mg/dL — ABNORMAL HIGH (ref 65–99)
Glucose-Capillary: 181 mg/dL — ABNORMAL HIGH (ref 65–99)
Glucose-Capillary: 170 mg/dL — ABNORMAL HIGH (ref 65–99)

## 2014-11-25 DIAGNOSIS — E119 Type 2 diabetes mellitus without complications: Secondary | ICD-10-CM | POA: Diagnosis not present

## 2014-11-25 LAB — GLUCOSE, CAPILLARY
GLUCOSE-CAPILLARY: 157 mg/dL — AB (ref 65–99)
Glucose-Capillary: 83 mg/dL (ref 65–99)
Glucose-Capillary: 130 mg/dL — ABNORMAL HIGH (ref 65–99)
Glucose-Capillary: 79 mg/dL (ref 65–99)

## 2014-11-26 DIAGNOSIS — E119 Type 2 diabetes mellitus without complications: Secondary | ICD-10-CM | POA: Diagnosis not present

## 2014-11-26 LAB — GLUCOSE, CAPILLARY
GLUCOSE-CAPILLARY: 90 mg/dL (ref 65–99)
GLUCOSE-CAPILLARY: 121 mg/dL — AB (ref 65–99)
Glucose-Capillary: 118 mg/dL — ABNORMAL HIGH (ref 65–99)
Glucose-Capillary: 109 mg/dL — ABNORMAL HIGH (ref 65–99)

## 2014-11-27 DIAGNOSIS — E119 Type 2 diabetes mellitus without complications: Secondary | ICD-10-CM | POA: Diagnosis not present

## 2014-11-27 LAB — GLUCOSE, CAPILLARY: Glucose-Capillary: 127 mg/dL — ABNORMAL HIGH (ref 65–99)

## 2014-11-28 LAB — GLUCOSE, CAPILLARY
GLUCOSE-CAPILLARY: 148 mg/dL — AB (ref 65–99)
GLUCOSE-CAPILLARY: 64 mg/dL — AB (ref 65–99)
GLUCOSE-CAPILLARY: 106 mg/dL — AB (ref 65–99)
Glucose-Capillary: 125 mg/dL — ABNORMAL HIGH (ref 65–99)
Glucose-Capillary: 82 mg/dL (ref 65–99)

## 2014-11-29 DIAGNOSIS — E119 Type 2 diabetes mellitus without complications: Secondary | ICD-10-CM | POA: Diagnosis not present

## 2014-11-29 LAB — GLUCOSE, CAPILLARY
GLUCOSE-CAPILLARY: 84 mg/dL (ref 65–99)
GLUCOSE-CAPILLARY: 150 mg/dL — AB (ref 65–99)
Glucose-Capillary: 164 mg/dL — ABNORMAL HIGH (ref 65–99)
Glucose-Capillary: 56 mg/dL — ABNORMAL LOW (ref 65–99)
Glucose-Capillary: 127 mg/dL — ABNORMAL HIGH (ref 65–99)

## 2014-11-30 DIAGNOSIS — E119 Type 2 diabetes mellitus without complications: Secondary | ICD-10-CM | POA: Diagnosis not present

## 2014-11-30 LAB — GLUCOSE, CAPILLARY
GLUCOSE-CAPILLARY: 79 mg/dL (ref 65–99)
Glucose-Capillary: 122 mg/dL — ABNORMAL HIGH (ref 65–99)
Glucose-Capillary: 79 mg/dL (ref 65–99)
Glucose-Capillary: 132 mg/dL — ABNORMAL HIGH (ref 65–99)

## 2014-12-01 DIAGNOSIS — E119 Type 2 diabetes mellitus without complications: Secondary | ICD-10-CM | POA: Diagnosis not present

## 2014-12-01 LAB — GLUCOSE, CAPILLARY
GLUCOSE-CAPILLARY: 88 mg/dL (ref 65–99)
Glucose-Capillary: 127 mg/dL — ABNORMAL HIGH (ref 65–99)

## 2014-12-02 ENCOUNTER — Encounter: Payer: Self-pay | Admitting: Emergency Medicine

## 2014-12-02 ENCOUNTER — Emergency Department: Payer: Medicare Other

## 2014-12-02 ENCOUNTER — Emergency Department
Admission: EM | Admit: 2014-12-02 | Discharge: 2014-12-02 | Disposition: A | Discharge status: Home / Self Care | Payer: Medicare Other | Attending: Emergency Medicine | Admitting: Emergency Medicine

## 2014-12-02 DIAGNOSIS — S0083XA Contusion of other part of head, initial encounter: Secondary | ICD-10-CM | POA: Insufficient documentation

## 2014-12-02 DIAGNOSIS — Y998 Other external cause status: Secondary | ICD-10-CM | POA: Diagnosis not present

## 2014-12-02 DIAGNOSIS — Z7982 Long term (current) use of aspirin: Secondary | ICD-10-CM | POA: Insufficient documentation

## 2014-12-02 DIAGNOSIS — I10 Essential (primary) hypertension: Secondary | ICD-10-CM | POA: Diagnosis not present

## 2014-12-02 DIAGNOSIS — E119 Type 2 diabetes mellitus without complications: Secondary | ICD-10-CM | POA: Diagnosis not present

## 2014-12-02 DIAGNOSIS — Y9389 Activity, other specified: Secondary | ICD-10-CM | POA: Insufficient documentation

## 2014-12-02 DIAGNOSIS — Z87891 Personal history of nicotine dependence: Secondary | ICD-10-CM | POA: Diagnosis not present

## 2014-12-02 DIAGNOSIS — Y9289 Other specified places as the place of occurrence of the external cause: Secondary | ICD-10-CM | POA: Insufficient documentation

## 2014-12-02 DIAGNOSIS — M25552 Pain in left hip: Secondary | ICD-10-CM

## 2014-12-02 DIAGNOSIS — S61412A Laceration without foreign body of left hand, initial encounter: Secondary | ICD-10-CM | POA: Diagnosis not present

## 2014-12-02 DIAGNOSIS — W01198A Fall on same level from slipping, tripping and stumbling with subsequent striking against other object, initial encounter: Secondary | ICD-10-CM | POA: Diagnosis not present

## 2014-12-02 DIAGNOSIS — Z792 Long term (current) use of antibiotics: Secondary | ICD-10-CM | POA: Diagnosis not present

## 2014-12-02 DIAGNOSIS — Z79899 Other long term (current) drug therapy: Secondary | ICD-10-CM | POA: Insufficient documentation

## 2014-12-02 DIAGNOSIS — S0990XA Unspecified injury of head, initial encounter: Secondary | ICD-10-CM | POA: Diagnosis not present

## 2014-12-02 DIAGNOSIS — S79912A Unspecified injury of left hip, initial encounter: Secondary | ICD-10-CM | POA: Insufficient documentation

## 2014-12-02 DIAGNOSIS — W19XXXA Unspecified fall, initial encounter: Secondary | ICD-10-CM

## 2014-12-02 HISTORY — DX: Cardiac arrest, cause unspecified: I46.9

## 2014-12-02 HISTORY — DX: Fracture of unspecified part of neck of unspecified femur, initial encounter for closed fracture: S72.009A

## 2014-12-02 LAB — GLUCOSE, CAPILLARY
GLUCOSE-CAPILLARY: 160 mg/dL — AB (ref 65–99)
GLUCOSE-CAPILLARY: 99 mg/dL (ref 65–99)
GLUCOSE-CAPILLARY: 103 mg/dL — AB (ref 65–99)
GLUCOSE-CAPILLARY: 101 mg/dL — AB (ref 65–99)
GLUCOSE-CAPILLARY: 86 mg/dL (ref 65–99)
GLUCOSE-CAPILLARY: 134 mg/dL — AB (ref 65–99)

## 2014-12-02 MED ORDER — MORPHINE SULFATE (PF) 4 MG/ML IV SOLN
4.0000 mg | Freq: Once | INTRAVENOUS | Status: AC
  Administered 2014-12-02: 4 mg via INTRAMUSCULAR
  Filled 2014-12-02: qty 1

## 2014-12-02 MED ORDER — HYDROCODONE-ACETAMINOPHEN 5-325 MG PO TABS: 2.0000 | ORAL_TABLET | Freq: Once | ORAL | Status: DC

## 2014-12-02 NOTE — ED Notes (Signed)
Pt done voiding; cleaned well; repositioned for comfort; callbell in reach; son at bedside

## 2014-12-02 NOTE — Discharge Instructions (Signed)
You have been seen in the Emergency Department (ED) today for a fall.  Your work up does not show any concerning injuries.  Please take over-the-counter ibuprofen and/or Tylenol as needed for your pain (unless you have an allergy or your doctor as told you not to take them), or take any prescribed medication as instructed.  Please follow up with your doctor regarding today's Emergency Department (ED) visit and your recent fall.    Return to the ED if you have any headache, confusion, slurred speech, weakness/numbness of any arm or leg, or any increased pain.   Head Injury, Adult You have a head injury. Headaches and throwing up (vomiting) are common after a head injury. It should be easy to wake up from sleeping. Sometimes you must stay in the hospital. Most problems happen within the first 24 hours. Side effects may occur up to 7-10 days after the injury.  WHAT ARE THE TYPES OF HEAD INJURIES? Head injuries can be as minor as a bump. Some head injuries can be more severe. More severe head injuries include:  A jarring injury to the brain (concussion).  A bruise of the brain (contusion). This mean there is bleeding in the brain that can cause swelling.  A cracked skull (skull fracture).  Bleeding in the brain that collects, clots, and forms a bump (hematoma). WHEN SHOULD I GET HELP RIGHT AWAY?   You are confused or sleepy.  You cannot be woken up.  You feel sick to your stomach (nauseous) or keep throwing up (vomiting).  Your dizziness or unsteadiness is getting worse.  You have very bad, lasting headaches that are not helped by medicine. Take medicines only as told by your doctor.  You cannot use your arms or legs like normal.  You cannot walk.  You notice changes in the black spots in the center of the colored part of your eye (pupil).  You have clear or bloody fluid coming from your nose or ears.  You have trouble seeing. During the next 24 hours after the injury, you must  stay with someone who can watch you. This person should get help right away (call 911 in the [place] if you start to shake and are not able to control it (have seizures), you pass out, or you are unable to wake up. HOW CAN I PREVENT A HEAD INJURY IN THE FUTURE?  Wear seat belts.  Wear a helmet while bike riding and playing sports like football.  Stay away from dangerous activities around the house. WHEN CAN I RETURN TO NORMAL ACTIVITIES AND ATHLETICS? See your doctor before doing these activities. You should not do normal activities or play contact sports until 1 week after the following symptoms have stopped:  Headache that does not go away.  Dizziness.  Poor attention.  Confusion.  Memory problems.  Sickness to your stomach or throwing up.  Tiredness.  Fussiness.  Bothered by bright lights or loud noises.  Anxiousness or depression.  Restless sleep. MAKE SURE YOU:   Understand these instructions.  Will watch your condition.  Will get help right away if you are not doing well or get worse.   This information is not intended to replace advice given to you by your health care provider. Make sure you discuss any questions you have with your health care provider.   Document Released: 12/13/2007 Document Revised: 01/20/2014 Document Reviewed: 09/06/2012 Elsevier Interactive Patient Education 2016 [place].   Facial or Scalp Contusion A facial or scalp contusion is  a deep bruise on the face or head. Injuries to the face and head generally cause a lot of swelling, especially around the eyes. Contusions are the result of an injury that caused bleeding under the skin. The contusion may turn blue, purple, or yellow. Minor injuries will give you a painless contusion, but more severe contusions may stay painful and swollen for a few weeks.  CAUSES  A facial or scalp contusion is caused by a blunt injury or trauma to the face or head area.  SIGNS AND SYMPTOMS   Swelling of  the injured area.   Discoloration of the injured area.   Tenderness, soreness, or pain in the injured area.  DIAGNOSIS  The diagnosis can be made by taking a medical history and doing a physical exam. An X-ray exam, CT scan, or MRI may be needed to determine if there are any associated injuries, such as broken bones (fractures). TREATMENT  Often, the best treatment for a facial or scalp contusion is applying cold compresses to the injured area. Over-the-counter medicines may also be recommended for pain control.  HOME CARE INSTRUCTIONS   Only take over-the-counter or prescription medicines as directed by your health care provider.   Apply ice to the injured area.   Put ice in a plastic bag.   Place a towel between your skin and the bag.   Leave the ice on for 20 minutes, 2-3 times a day.  SEEK MEDICAL CARE IF:  You have bite problems.   You have pain with chewing.   You are concerned about facial defects. SEEK IMMEDIATE MEDICAL CARE IF:  You have severe pain or a headache that is not relieved by medicine.   You have unusual sleepiness, confusion, or personality changes.   You throw up (vomit).   You have a persistent nosebleed.   You have double vision or blurred vision.   You have fluid drainage from your nose or ear.   You have difficulty walking or using your arms or legs.  MAKE SURE YOU:   Understand these instructions.  Will watch your condition.  Will get help right away if you are not doing well or get worse.   This information is not intended to replace advice given to you by your health care provider. Make sure you discuss any questions you have with your health care provider.   Document Released: 02/07/2004 Document Revised: 01/20/2014 Document Reviewed: 08/12/2012 Elsevier Interactive Patient Education 2016 [place].  Deep Skin Avulsion A deep skin avulsion is a type of open wound. It often results from a severe injury (trauma)  that tears away all layers of the skin or an entire body part. The areas of the body that are most often affected by a deep skin avulsion include the face, lips, ears, nose, and fingers. A deep skin avulsion may make structures below the skin become visible. You may be able to see muscle, bone, nerves, and blood vessels. A deep skin avulsion can also damage important structures beneath the skin. These include tendons, ligaments, nerves, or blood vessels. CAUSES Injuries that often cause a deep skin avulsion include:  Being crushed.  Falling against a jagged surface.  Animal bites.  Gunshot wounds.  Severe burns.  Injuries that involve being dragged, such as bicycle or motorcycle accidents. SYMPTOMS Symptoms of a deep skin avulsion include:  Pain.  Numbness.  Swelling.  A misshapen body part.  Bleeding, which may be heavy.  Fluid leaking from the wound. DIAGNOSIS This condition  may be diagnosed with a medical history and physical exam. You may also have X-rays done. TREATMENT The treatment that is chosen for a deep skin avulsion depends on how large and deep the wound is and where it is located. Treatment for all types of avulsions usually starts with:  Controlling the bleeding.  Washing out the wound with a germ-free (sterile) salt-water solution.  Removing dead tissue from the wound. A wound may be closed or left open to heal. This depends on the size and location of the wound and whether it is likely to become infected. Wounds are usually covered or closed if they expose blood vessels, nerves, bone, or cartilage.  Wounds that are small and clean may be closed with stitches (sutures).  Wounds that cannot be closed with sutures may be covered with a piece of skin (graft) or a skin flap. Skin may be taken from on or near the wound, from another part of the body, or from a donor.  Wounds may be left open if they are hard to close or they may become infected. These wounds  heal over time from the bottom up. You may also receive medicine. This may include:  Antibiotics.  A tetanus shot.  Rabies vaccine. HOME CARE INSTRUCTIONS Medicines  Take or apply over-the-counter and prescription medicines only as told by your health care provider.  If you were prescribed an antibiotic, take or apply it as told by your health care provider. Do not stop taking the antibiotic even if your condition improves.  You may get anti-itch medicine while your wound is healing. Use it only as told by your health care provider. Wound Care  There are many ways to close and cover a wound. For example, a wound can be covered with sutures, skin glue, or adhesive strips. Follow instructions from your health care provider about:  How to take care of your wound.  When and how you should change your bandage (dressing).  When you should remove your dressing.  Removing whatever was used to close your wound.  Keep the dressing dry as told by your health care provider. Do not take baths, swim, use a hot tub, or do anything that would put your wound underwater until your health care provider approves.  Clean the wound each day or as told by your health care provider.  Wash the wound with mild soap and water.  Rinse the wound with water to remove all soap.  Pat the wound dry with a clean towel. Do not rub it.  Do not scratch or pick at the wound.  Check your wound every day for signs of infection. Watch for:  Redness, swelling, or pain.  Fluid, blood, or pus. General Instructions  Raise (elevate) the injured area above the level of your heart while you are sitting or lying down.  Keep all follow-up visits as told by your health care provider. This is important. SEEK MEDICAL CARE IF:  You received a tetanus shot and you have swelling, severe pain, redness, or bleeding at the injection site.  You have a fever.  Your pain is not controlled with medicine.  You have  increased redness, swelling, or pain at the site of your wound.  You have fluid, blood, or pus coming from your wound.  You notice a bad smell coming from your wound or your dressing.  A wound that was closed breaks open.  You notice something coming out of the wound, such as wood or glass.  You  notice a change in the color of your skin near your wound.  You develop a new rash.  You need to change the dressing frequently due to fluid, blood, or pus draining from the wound. SEEK IMMEDIATE MEDICAL CARE IF:  Your pain suddenly increases and is severe.  You develop severe swelling around the wound.  You develop numbness around the wound.  You have nausea and vomiting that does not go away after 24 hours.  You feel light-headed, weak, or faint.  You develop chest pain.  You have trouble breathing.  Your wound is on your hand or foot and you cannot properly move a finger or toe.  The wound is on your hand or foot and you notice that your fingers or toes look pale or bluish.  You have a red streak going away from your wound.   This information is not intended to replace advice given to you by your health care provider. Make sure you discuss any questions you have with your health care provider.   Document Released: 02/25/2006 Document Revised: 05/16/2014 Document Reviewed: 01/04/2014 Elsevier Interactive Patient Education [employer].

## 2014-12-02 NOTE — ED Notes (Signed)
Report given to [place] at [facility]

## 2014-12-02 NOTE — ED Provider Notes (Signed)
[name] Emergency Department Provider Note  ____________________________________________  Time seen: Approximately 12:53 AM  I have reviewed the triage vital signs and the nursing notes.   HISTORY  Chief Complaint Fall; Head Injury; and Extremity Laceration    HPI Sharon [name] is a 81 y.o. female with a medical history denied includes bilateral hip surgeries and only aspirin for anticoagulation who presents by EMS after mechanical fall.  She has a large hematoma on the left side of her forehead and is reporting left hip pain.  She states that she was transferring from a chair to her bed and slipped and fell.  She is not sure if she lost consciousness because she was very sleepy while it happened, but she has been awake and alert since the episode.  She denies any pain in her neck and complains mostly of the pain in her hip.  The pain is moderate but she had 20 mg of oxycodone prior to coming to the emergency department from her nursing facility.  She also has a skin tear on her left hand, that she has no bony pain or tenderness.  The onset of the symptoms was acute.  She denies chest pain, shortness of breath, fever/chills, abdominal pain.   Past Medical History  Diagnosis Date  . CHF (congestive heart failure) ([place])   . Diabetes mellitus without complication ([place])   . Hyperlipidemia   . Atrial fibrillation ([place])   . Hypothyroid   . Hypertension   . Stroke ([place])   . Neuropathy ([place])   . Anemia   . Edema     feet/ankles occas  . Osteomyelitis ([place])     left first metatarsal  . Hip fracture ([place])   . Cardiac arrest [facility])     Patient Active Problem List   Diagnosis Date Noted  . ARF (acute renal failure) ([place]) 11/05/2014  . Diabetic foot infection ([place]) 10/10/2014  . Chronic diastolic heart failure ([place]) 05/31/2014  . HTN (hypertension) 05/31/2014  . DM (diabetes mellitus) type 2, uncontrolled, with ketoacidosis ([place]) 05/31/2014    Past  Surgical History  Procedure Laterality Date  . Hemiarthroplasty hip Right   . Hemiarthroplasty hip Left   . Cholecystectomy    . Appendectomy    . Cardiac catheterization  08/25/13  . Coronary angioplasty    . Cataract extraction w/phaco Left 07/20/2014    Procedure: CATARACT EXTRACTION PHACO AND INTRAOCULAR LENS PLACEMENT (IOC);  Surgeon: Leandrew Koyanagi, MD;  Location: [facility] ORS;  Service: Ophthalmology;  Laterality: Left;  [place]  1:18                 AP     23.6             CDE   9.69      lot #[id number]  . Incision and drainage Left 10/11/2014    Procedure: Removal of infected tibial sessmoid;  Surgeon: Albertine Patricia, DPM;  Location: [facility] ORS;  Service: Podiatry;  Laterality: Left;    Current Outpatient Rx  Name  Route  Sig  Dispense  Refill  . acetaminophen (TYLENOL) 500 MG tablet   Oral   Take 500 mg by mouth every 4 (four) hours as needed for mild pain or moderate pain.         Marland Kitchen amiodarone (PACERONE) 200 MG tablet      Take one tablet by mouth once daily for AFIB         . aspirin 81 MG tablet  Oral   Take 81 mg by mouth daily.         . bisacodyl (DULCOLAX) 5 MG EC tablet   Oral   Take 1 tablet (5 mg total) by mouth daily.   30 tablet   0   . capsaicin (ZOSTRIX) 0.025 % cream   Topical   Apply topically 2 (two) times daily.   60 g   0   . cholecalciferol 2000 UNITS TABS   Oral   Take 1 tablet (2,000 Units total) by mouth daily.   1 tablet   1   . doxycycline (VIBRA-TABS) 100 MG tablet   Oral   Take 1 tablet (100 mg total) by mouth 2 (two) times daily.   1 tablet   1   . ferrous sulfate 325 (65 FE) MG tablet   Oral   Take 325 mg by mouth daily with breakfast.         . gabapentin (NEURONTIN) 600 MG tablet      Take one tablet by mouth twice daily for neuropathy         . GLIPIZIDE XL 2.5 MG 24 hr tablet   Oral   Take 2.5 mg by mouth daily.      2     Dispense as written.   Marland Kitchen levothyroxine (SYNTHROID, LEVOTHROID) 112 MCG tablet       Take one tablet by mouth once daily 30 minutes before breakfast once daily for hypothyroid         . lisinopril (PRINIVIL,ZESTRIL) 10 MG tablet   Oral   Take 10 mg by mouth daily.         . metroNIDAZOLE (FLAGYL) 500 MG tablet   Oral   Take 1 tablet (500 mg total) by mouth every 8 (eight) hours.   1 tablet   1   . Multiple Vitamins-Minerals (PRESERVISION AREDS 2 PO)   Oral   Take 2 tablets by mouth daily.         . pantoprazole (PROTONIX) 40 MG tablet   Oral   Take 1 tablet (40 mg total) by mouth daily.   1 tablet   1   . simvastatin (ZOCOR) 40 MG tablet      Take one tablet by mouth once daily at bedtime for cholesterol         . temazepam (RESTORIL) 30 MG capsule   Oral   Take 1 capsule (30 mg total) by mouth at bedtime.   30 capsule   0     Allergies Codeine; Cymbalta; Duloxetine; and Lyrica  Family History  Problem Relation Age of Onset  . Heart attack Mother     Social History Social History  Substance Use Topics  . Smoking status: Former Smoker -- 0.50 packs/day for 7 years    Types: Cigarettes    Quit date: 05/30/1989  . Smokeless tobacco: Never Used  . Alcohol Use: No    Review of Systems Constitutional: No fever/chills Eyes: No visual changes. ENT: No sore throat. Cardiovascular: Denies chest pain. Respiratory: Denies shortness of breath. Gastrointestinal: No abdominal pain.  No nausea, no vomiting.  No diarrhea.  No constipation. Genitourinary: Negative for dysuria. Musculoskeletal: Negative for back pain.  Moderate pain in her left hip.  Pain in her left forehead.  No neck pain. Skin: Negative for rash. Neurological: Negative for headaches, focal weakness or numbness.  10-point ROS otherwise negative.  ____________________________________________   PHYSICAL EXAM:  VITAL SIGNS: ED Triage Vitals  Enc Vitals Group  BP 12/02/14 0040 146/94 mmHg     Pulse Rate 12/02/14 0040 80     Resp 12/02/14 0040 14     Temp 12/02/14  0040 97.6 F (36.4 C)     Temp Source 12/02/14 0040 Oral     SpO2 12/02/14 0040 96 %     Weight 12/02/14 0040 129 lb (58.514 kg)     Height 12/02/14 0040 '5\' 5"'$  (1.651 m)     Head Cir --      Peak Flow --      Pain Score 12/02/14 0042 7     Pain Loc --      Pain Edu? --      Excl. in [place]? --     Constitutional: Alert and oriented. Well appearing and in no acute distress. Eyes: Conjunctivae are normal. PERRL. EOMI. Head: Large hematoma to the left side of her forehead. Nose: No congestion/rhinnorhea. Mouth/Throat: Mucous membranes are moist.  Oropharynx non-erythematous. Neck: No stridor.  No cervical spine tenderness to palpation. Cardiovascular: Normal rate, regular rhythm. Grossly normal heart sounds.  Good peripheral circulation. Respiratory: Normal respiratory effort.  No retractions. Lungs CTAB. Gastrointestinal: Soft and nontender. No distention. No abdominal bruits. No CVA tenderness. Musculoskeletal: Tenderness to palpation of the left hip with no obvious deformity including no shortening or rotation of the extremity.  Pelvis is stable and nontender otherwise.  No pain or tenderness in the right hip. Neurologic:  Normal speech and language. No gross focal neurologic deficits are appreciated.  Skin:  Skin avulsion to the left hand near the thumb on the dorsal side. Psychiatric: Mood and affect are normal. Speech and behavior are normal.  ____________________________________________   LABS (all labs ordered are listed, but only abnormal results are displayed)  Labs Reviewed - No data to display ____________________________________________  EKG  Not indicated ____________________________________________  RADIOLOGY   Ct Head Wo Contrast  12/02/2014  CLINICAL DATA:  Syncopal episode 30 minutes prior to arrival after receiving pain medication. LEFT forehead hematoma. History of diabetes, stroke, hypertension. EXAM: CT HEAD WITHOUT CONTRAST CT CERVICAL SPINE WITHOUT  CONTRAST TECHNIQUE: Multidetector CT imaging of the head and cervical spine was performed following the standard protocol without intravenous contrast. Multiplanar CT image reconstructions of the cervical spine were also generated. COMPARISON:  CT head November 05, 2014 FINDINGS: CT HEAD FINDINGS The ventricles and sulci are normal for age. No intraparenchymal hemorrhage, mass effect nor midline shift. Patchy supratentorial white matter hypodensities are within normal range for patient's age and though non-specific suggest sequelae of chronic small vessel ischemic disease. No acute large vascular territory infarcts. No abnormal extra-axial fluid collections. Basal cisterns are patent. Mild calcific atherosclerosis of the carotid siphons. Moderate LEFT frontal scalp hematoma without subcutaneous gas or radiopaque foreign bodies. No skull fracture. The included ocular globes and orbital contents are non-suspicious. Status post LEFT ocular lens implant. The mastoid aircells and included paranasal sinuses are well-aerated. CT CERVICAL SPINE FINDINGS Cervical vertebral bodies and posterior elements intact. Straightened cervical lordosis. Grade 1 C4-5 anterolisthesis on degenerative basis. Severe C3-4, C5-6, C6-7 and C7-T1 disc height loss, endplate sclerosis and marginal spurring compatible with degenerative disc, moderate to severe at C4-5. C1-2 articulation maintained with moderate osteoarthrosis, small os odontoideum versus calcified ligament. Osteopenia without destructive bony lesions. Bilateral upper lobe fibronodular scarring. No prevertebral soft tissue swelling. Severe RIGHT C4-5, RIGHT C5-6 and bilateral C6-7 neural foraminal narrowing. IMPRESSION: CT HEAD: Moderate LEFT frontal scalp hematoma.  No skull fracture. Negative CT head for age.  CT CERVICAL SPINE: No acute cervical spine fracture. Grade 1 C4-5 anterolisthesis on degenerative basis. Electronically Signed   By: Elon Alas M.D.   On: 12/02/2014  01:48   Ct Cervical Spine Wo Contrast  12/02/2014  CLINICAL DATA:  Syncopal episode 30 minutes prior to arrival after receiving pain medication. LEFT forehead hematoma. History of diabetes, stroke, hypertension. EXAM: CT HEAD WITHOUT CONTRAST CT CERVICAL SPINE WITHOUT CONTRAST TECHNIQUE: Multidetector CT imaging of the head and cervical spine was performed following the standard protocol without intravenous contrast. Multiplanar CT image reconstructions of the cervical spine were also generated. COMPARISON:  CT head November 05, 2014 FINDINGS: CT HEAD FINDINGS The ventricles and sulci are normal for age. No intraparenchymal hemorrhage, mass effect nor midline shift. Patchy supratentorial white matter hypodensities are within normal range for patient's age and though non-specific suggest sequelae of chronic small vessel ischemic disease. No acute large vascular territory infarcts. No abnormal extra-axial fluid collections. Basal cisterns are patent. Mild calcific atherosclerosis of the carotid siphons. Moderate LEFT frontal scalp hematoma without subcutaneous gas or radiopaque foreign bodies. No skull fracture. The included ocular globes and orbital contents are non-suspicious. Status post LEFT ocular lens implant. The mastoid aircells and included paranasal sinuses are well-aerated. CT CERVICAL SPINE FINDINGS Cervical vertebral bodies and posterior elements intact. Straightened cervical lordosis. Grade 1 C4-5 anterolisthesis on degenerative basis. Severe C3-4, C5-6, C6-7 and C7-T1 disc height loss, endplate sclerosis and marginal spurring compatible with degenerative disc, moderate to severe at C4-5. C1-2 articulation maintained with moderate osteoarthrosis, small os odontoideum versus calcified ligament. Osteopenia without destructive bony lesions. Bilateral upper lobe fibronodular scarring. No prevertebral soft tissue swelling. Severe RIGHT C4-5, RIGHT C5-6 and bilateral C6-7 neural foraminal narrowing.  IMPRESSION: CT HEAD: Moderate LEFT frontal scalp hematoma.  No skull fracture. Negative CT head for age. CT CERVICAL SPINE: No acute cervical spine fracture. Grade 1 C4-5 anterolisthesis on degenerative basis. Electronically Signed   By: Elon Alas M.D.   On: 12/02/2014 01:48   Dg Hip Unilat With Pelvis 2-3 Views Left  12/02/2014  CLINICAL DATA:  Fall from chair with left hip pain. History of hip placement. EXAM: DG HIP (WITH OR WITHOUT PELVIS) 2-3V LEFT COMPARISON:  Pelvis radiograph 11/05/2014 FINDINGS: Bilateral hip arthroplasties in place without periprosthetic lucency. No periprosthetic fracture, particularly about the left hip prosthesis. Left inferior pubic ramus fracture is again seen, unchanged in alignment. There is been minimal interval healing since the prior exam. Questionable fracture of the left superior pubic ramus, likely present on prior exam, better seen today due to dedicated hip imaging. No new fracture is seen. The bones are under mineralized. IMPRESSION: 1. Unchanged alignment of left inferior pubic ramus fracture with minimal interval healing since prior exam. Probable left superior pubic ramus fracture that was present previously, better seen today due to dedicated hip imaging. 2. Bilateral hip arthroplasties in place without complication. No acute pelvic or periprosthetic fracture. Electronically Signed   By: Jeb Levering M.D.   On: 12/02/2014 01:29    ____________________________________________   PROCEDURES  Procedure(s) performed:  None  Critical Care performed: No ____________________________________________   INITIAL IMPRESSION / ASSESSMENT AND PLAN / ED COURSE  Pertinent labs & imaging results that were available during my care of the patient were reviewed by me and considered in my medical decision making (see chart for details).  Well-appearing, NAD.  Fortunately no acute fractures/dislocations of hip and no acute intracranial injury.  I was  occupied with a critical patient, so  RN applied steri-strips to skin avulsion and dressed the wound.  Patient comfortable with d/c to facility.  ____________________________________________  FINAL CLINICAL IMPRESSION(S) / ED DIAGNOSES  Final diagnoses:  Skin tear of left hand without complication, initial encounter  Traumatic hematoma of forehead, initial encounter  Left hip pain  Fall, initial encounter      NEW MEDICATIONS STARTED DURING THIS VISIT:  Discharge Medication List as of 12/02/2014  3:25 AM       Hinda Kehr, MD 12/02/14 [phone]

## 2014-12-02 NOTE — ED Notes (Signed)
Pt from [place]. Pt states received "a pain pill about 10 o'clock", pt states after received medication was seated in a chair. Pt states she slid forward, face first out of chair approx 30 min pta. perrl 3m, pt denies loc. Pt with hematoma to left forehead, denies neck tenderness, skin tear approx 4 inch to left wrist. Pt moving all extremities. Pt alert and oriented x4.

## 2014-12-02 NOTE — ED Notes (Signed)
Pt assisted onto bedpan; callbell in reach

## 2014-12-02 NOTE — ED Notes (Signed)
Left hand cleaned well with NS; steri strips placed to skin tear; dry dressing in place; pt tolerated well; discharge papers ready; secretary to facilitate transport back to [facility]

## 2014-12-03 DIAGNOSIS — E119 Type 2 diabetes mellitus without complications: Secondary | ICD-10-CM | POA: Diagnosis not present

## 2014-12-03 LAB — GLUCOSE, CAPILLARY
GLUCOSE-CAPILLARY: 83 mg/dL (ref 65–99)
GLUCOSE-CAPILLARY: 156 mg/dL — AB (ref 65–99)
Glucose-Capillary: 124 mg/dL — ABNORMAL HIGH (ref 65–99)

## 2014-12-04 DIAGNOSIS — E119 Type 2 diabetes mellitus without complications: Secondary | ICD-10-CM | POA: Diagnosis not present

## 2014-12-04 LAB — GLUCOSE, CAPILLARY
GLUCOSE-CAPILLARY: 148 mg/dL — AB (ref 65–99)
GLUCOSE-CAPILLARY: 71 mg/dL (ref 65–99)
Glucose-Capillary: 100 mg/dL — ABNORMAL HIGH (ref 65–99)
Glucose-Capillary: 186 mg/dL — ABNORMAL HIGH (ref 65–99)

## 2014-12-05 DIAGNOSIS — E119 Type 2 diabetes mellitus without complications: Secondary | ICD-10-CM | POA: Diagnosis not present

## 2014-12-05 LAB — GLUCOSE, CAPILLARY
GLUCOSE-CAPILLARY: 87 mg/dL (ref 65–99)
Glucose-Capillary: 149 mg/dL — ABNORMAL HIGH (ref 65–99)
Glucose-Capillary: 68 mg/dL (ref 65–99)
Glucose-Capillary: 220 mg/dL — ABNORMAL HIGH (ref 65–99)

## 2014-12-06 DIAGNOSIS — E119 Type 2 diabetes mellitus without complications: Secondary | ICD-10-CM | POA: Diagnosis not present

## 2014-12-06 LAB — GLUCOSE, CAPILLARY
Glucose-Capillary: 100 mg/dL — ABNORMAL HIGH (ref 65–99)
Glucose-Capillary: 101 mg/dL — ABNORMAL HIGH (ref 65–99)

## 2014-12-07 DIAGNOSIS — E119 Type 2 diabetes mellitus without complications: Secondary | ICD-10-CM | POA: Diagnosis not present

## 2014-12-07 LAB — GLUCOSE, CAPILLARY
GLUCOSE-CAPILLARY: 109 mg/dL — AB (ref 65–99)
GLUCOSE-CAPILLARY: 71 mg/dL (ref 65–99)
Glucose-Capillary: 117 mg/dL — ABNORMAL HIGH (ref 65–99)

## 2014-12-08 DIAGNOSIS — E119 Type 2 diabetes mellitus without complications: Secondary | ICD-10-CM | POA: Diagnosis not present

## 2014-12-08 LAB — GLUCOSE, CAPILLARY
GLUCOSE-CAPILLARY: 90 mg/dL (ref 65–99)
GLUCOSE-CAPILLARY: 116 mg/dL — AB (ref 65–99)
Glucose-Capillary: 112 mg/dL — ABNORMAL HIGH (ref 65–99)
Glucose-Capillary: 155 mg/dL — ABNORMAL HIGH (ref 65–99)

## 2014-12-09 DIAGNOSIS — E119 Type 2 diabetes mellitus without complications: Secondary | ICD-10-CM | POA: Diagnosis not present

## 2014-12-09 LAB — GLUCOSE, CAPILLARY
GLUCOSE-CAPILLARY: 78 mg/dL (ref 65–99)
GLUCOSE-CAPILLARY: 104 mg/dL — AB (ref 65–99)
GLUCOSE-CAPILLARY: 71 mg/dL (ref 65–99)
Glucose-Capillary: 120 mg/dL — ABNORMAL HIGH (ref 65–99)

## 2014-12-14 ENCOUNTER — Encounter
Admission: RE | Admit: 2014-12-14 | Discharge: 2014-12-14 | Disposition: A | Discharge status: Home / Self Care | Payer: Medicare Other | Source: Ambulatory Visit | Attending: Internal Medicine | Admitting: Internal Medicine

## 2014-12-14 DIAGNOSIS — E119 Type 2 diabetes mellitus without complications: Secondary | ICD-10-CM | POA: Insufficient documentation

## 2014-12-14 DIAGNOSIS — Z794 Long term (current) use of insulin: Secondary | ICD-10-CM | POA: Diagnosis not present

## 2014-12-14 LAB — GLUCOSE, CAPILLARY: Glucose-Capillary: 165 mg/dL — ABNORMAL HIGH (ref 65–99)

## 2015-01-16 DIAGNOSIS — E119 Type 2 diabetes mellitus without complications: Secondary | ICD-10-CM | POA: Diagnosis not present

## 2015-01-16 DIAGNOSIS — L97522 Non-pressure chronic ulcer of other part of left foot with fat layer exposed: Secondary | ICD-10-CM | POA: Diagnosis not present

## 2015-01-17 DIAGNOSIS — E1140 Type 2 diabetes mellitus with diabetic neuropathy, unspecified: Secondary | ICD-10-CM | POA: Diagnosis not present

## 2015-01-17 DIAGNOSIS — Z9181 History of falling: Secondary | ICD-10-CM | POA: Diagnosis not present

## 2015-01-17 DIAGNOSIS — W19XXXD Unspecified fall, subsequent encounter: Secondary | ICD-10-CM | POA: Diagnosis not present

## 2015-01-17 DIAGNOSIS — Z7982 Long term (current) use of aspirin: Secondary | ICD-10-CM | POA: Diagnosis not present

## 2015-01-17 DIAGNOSIS — E039 Hypothyroidism, unspecified: Secondary | ICD-10-CM | POA: Diagnosis not present

## 2015-01-17 DIAGNOSIS — I110 Hypertensive heart disease with heart failure: Secondary | ICD-10-CM | POA: Diagnosis not present

## 2015-01-17 DIAGNOSIS — Z4889 Encounter for other specified surgical aftercare: Secondary | ICD-10-CM | POA: Diagnosis not present

## 2015-01-17 DIAGNOSIS — Z8614 Personal history of Methicillin resistant Staphylococcus aureus infection: Secondary | ICD-10-CM | POA: Diagnosis not present

## 2015-01-17 DIAGNOSIS — Z79891 Long term (current) use of opiate analgesic: Secondary | ICD-10-CM | POA: Diagnosis not present

## 2015-01-17 DIAGNOSIS — I4891 Unspecified atrial fibrillation: Secondary | ICD-10-CM | POA: Diagnosis not present

## 2015-01-17 DIAGNOSIS — I509 Heart failure, unspecified: Secondary | ICD-10-CM | POA: Diagnosis not present

## 2015-01-17 DIAGNOSIS — E785 Hyperlipidemia, unspecified: Secondary | ICD-10-CM | POA: Diagnosis not present

## 2015-01-17 DIAGNOSIS — Z7984 Long term (current) use of oral hypoglycemic drugs: Secondary | ICD-10-CM | POA: Diagnosis not present

## 2015-01-17 DIAGNOSIS — S329XXD Fracture of unspecified parts of lumbosacral spine and pelvis, subsequent encounter for fracture with routine healing: Secondary | ICD-10-CM | POA: Diagnosis not present

## 2015-01-23 DIAGNOSIS — Z8614 Personal history of Methicillin resistant Staphylococcus aureus infection: Secondary | ICD-10-CM | POA: Diagnosis not present

## 2015-01-23 DIAGNOSIS — Z7984 Long term (current) use of oral hypoglycemic drugs: Secondary | ICD-10-CM | POA: Diagnosis not present

## 2015-01-23 DIAGNOSIS — Z79891 Long term (current) use of opiate analgesic: Secondary | ICD-10-CM | POA: Diagnosis not present

## 2015-01-23 DIAGNOSIS — I509 Heart failure, unspecified: Secondary | ICD-10-CM | POA: Diagnosis not present

## 2015-01-23 DIAGNOSIS — E039 Hypothyroidism, unspecified: Secondary | ICD-10-CM | POA: Diagnosis not present

## 2015-01-23 DIAGNOSIS — S329XXD Fracture of unspecified parts of lumbosacral spine and pelvis, subsequent encounter for fracture with routine healing: Secondary | ICD-10-CM | POA: Diagnosis not present

## 2015-01-23 DIAGNOSIS — I110 Hypertensive heart disease with heart failure: Secondary | ICD-10-CM | POA: Diagnosis not present

## 2015-01-23 DIAGNOSIS — E785 Hyperlipidemia, unspecified: Secondary | ICD-10-CM | POA: Diagnosis not present

## 2015-01-23 DIAGNOSIS — Z4889 Encounter for other specified surgical aftercare: Secondary | ICD-10-CM | POA: Diagnosis not present

## 2015-01-23 DIAGNOSIS — E1140 Type 2 diabetes mellitus with diabetic neuropathy, unspecified: Secondary | ICD-10-CM | POA: Diagnosis not present

## 2015-01-23 DIAGNOSIS — Z7982 Long term (current) use of aspirin: Secondary | ICD-10-CM | POA: Diagnosis not present

## 2015-01-23 DIAGNOSIS — I4891 Unspecified atrial fibrillation: Secondary | ICD-10-CM | POA: Diagnosis not present

## 2015-01-23 DIAGNOSIS — Z9181 History of falling: Secondary | ICD-10-CM | POA: Diagnosis not present

## 2015-01-23 DIAGNOSIS — W19XXXD Unspecified fall, subsequent encounter: Secondary | ICD-10-CM | POA: Diagnosis not present

## 2015-01-29 DIAGNOSIS — Z7982 Long term (current) use of aspirin: Secondary | ICD-10-CM | POA: Diagnosis not present

## 2015-01-29 DIAGNOSIS — Z9181 History of falling: Secondary | ICD-10-CM | POA: Diagnosis not present

## 2015-01-29 DIAGNOSIS — E039 Hypothyroidism, unspecified: Secondary | ICD-10-CM | POA: Diagnosis not present

## 2015-01-29 DIAGNOSIS — I4891 Unspecified atrial fibrillation: Secondary | ICD-10-CM | POA: Diagnosis not present

## 2015-01-29 DIAGNOSIS — Z4889 Encounter for other specified surgical aftercare: Secondary | ICD-10-CM | POA: Diagnosis not present

## 2015-01-29 DIAGNOSIS — Z7984 Long term (current) use of oral hypoglycemic drugs: Secondary | ICD-10-CM | POA: Diagnosis not present

## 2015-01-29 DIAGNOSIS — I509 Heart failure, unspecified: Secondary | ICD-10-CM | POA: Diagnosis not present

## 2015-01-29 DIAGNOSIS — S329XXD Fracture of unspecified parts of lumbosacral spine and pelvis, subsequent encounter for fracture with routine healing: Secondary | ICD-10-CM | POA: Diagnosis not present

## 2015-01-29 DIAGNOSIS — Z8614 Personal history of Methicillin resistant Staphylococcus aureus infection: Secondary | ICD-10-CM | POA: Diagnosis not present

## 2015-01-29 DIAGNOSIS — W19XXXD Unspecified fall, subsequent encounter: Secondary | ICD-10-CM | POA: Diagnosis not present

## 2015-01-29 DIAGNOSIS — E785 Hyperlipidemia, unspecified: Secondary | ICD-10-CM | POA: Diagnosis not present

## 2015-01-29 DIAGNOSIS — E1140 Type 2 diabetes mellitus with diabetic neuropathy, unspecified: Secondary | ICD-10-CM | POA: Diagnosis not present

## 2015-01-29 DIAGNOSIS — Z79891 Long term (current) use of opiate analgesic: Secondary | ICD-10-CM | POA: Diagnosis not present

## 2015-01-29 DIAGNOSIS — I110 Hypertensive heart disease with heart failure: Secondary | ICD-10-CM | POA: Diagnosis not present

## 2015-01-30 DIAGNOSIS — E1142 Type 2 diabetes mellitus with diabetic polyneuropathy: Secondary | ICD-10-CM | POA: Diagnosis not present

## 2015-01-30 DIAGNOSIS — M2032 Hallux varus (acquired), left foot: Secondary | ICD-10-CM | POA: Diagnosis not present

## 2015-01-30 DIAGNOSIS — L97522 Non-pressure chronic ulcer of other part of left foot with fat layer exposed: Secondary | ICD-10-CM | POA: Diagnosis not present

## 2015-01-30 DIAGNOSIS — M216X1 Other acquired deformities of right foot: Secondary | ICD-10-CM | POA: Diagnosis not present

## 2015-01-31 DIAGNOSIS — I70213 Atherosclerosis of native arteries of extremities with intermittent claudication, bilateral legs: Secondary | ICD-10-CM | POA: Diagnosis not present

## 2015-01-31 DIAGNOSIS — I700 Atherosclerosis of aorta: Secondary | ICD-10-CM | POA: Diagnosis not present

## 2015-01-31 DIAGNOSIS — I10 Essential (primary) hypertension: Secondary | ICD-10-CM | POA: Diagnosis not present

## 2015-01-31 DIAGNOSIS — I739 Peripheral vascular disease, unspecified: Secondary | ICD-10-CM | POA: Diagnosis not present

## 2015-01-31 DIAGNOSIS — E785 Hyperlipidemia, unspecified: Secondary | ICD-10-CM | POA: Diagnosis not present

## 2015-01-31 DIAGNOSIS — E119 Type 2 diabetes mellitus without complications: Secondary | ICD-10-CM | POA: Diagnosis not present

## 2015-02-05 DIAGNOSIS — E1141 Type 2 diabetes mellitus with diabetic mononeuropathy: Secondary | ICD-10-CM | POA: Diagnosis not present

## 2015-02-05 DIAGNOSIS — I208 Other forms of angina pectoris: Secondary | ICD-10-CM | POA: Diagnosis not present

## 2015-02-05 DIAGNOSIS — D649 Anemia, unspecified: Secondary | ICD-10-CM | POA: Diagnosis not present

## 2015-02-05 DIAGNOSIS — G589 Mononeuropathy, unspecified: Secondary | ICD-10-CM | POA: Diagnosis not present

## 2015-02-05 DIAGNOSIS — Z79899 Other long term (current) drug therapy: Secondary | ICD-10-CM | POA: Diagnosis not present

## 2015-02-05 DIAGNOSIS — R011 Cardiac murmur, unspecified: Secondary | ICD-10-CM | POA: Diagnosis not present

## 2015-02-05 DIAGNOSIS — L97529 Non-pressure chronic ulcer of other part of left foot with unspecified severity: Secondary | ICD-10-CM | POA: Diagnosis not present

## 2015-02-05 DIAGNOSIS — R0602 Shortness of breath: Secondary | ICD-10-CM | POA: Diagnosis not present

## 2015-02-05 DIAGNOSIS — E118 Type 2 diabetes mellitus with unspecified complications: Secondary | ICD-10-CM | POA: Diagnosis not present

## 2015-02-05 DIAGNOSIS — K219 Gastro-esophageal reflux disease without esophagitis: Secondary | ICD-10-CM | POA: Diagnosis not present

## 2015-02-05 DIAGNOSIS — E11621 Type 2 diabetes mellitus with foot ulcer: Secondary | ICD-10-CM | POA: Diagnosis not present

## 2015-02-05 DIAGNOSIS — Z01818 Encounter for other preprocedural examination: Secondary | ICD-10-CM | POA: Diagnosis not present

## 2015-02-05 DIAGNOSIS — G63 Polyneuropathy in diseases classified elsewhere: Secondary | ICD-10-CM | POA: Diagnosis not present

## 2015-02-05 DIAGNOSIS — I480 Paroxysmal atrial fibrillation: Secondary | ICD-10-CM | POA: Diagnosis not present

## 2015-02-05 DIAGNOSIS — E784 Other hyperlipidemia: Secondary | ICD-10-CM | POA: Diagnosis not present

## 2015-02-05 DIAGNOSIS — E119 Type 2 diabetes mellitus without complications: Secondary | ICD-10-CM | POA: Diagnosis not present

## 2015-02-06 DIAGNOSIS — Z7982 Long term (current) use of aspirin: Secondary | ICD-10-CM | POA: Diagnosis not present

## 2015-02-06 DIAGNOSIS — E039 Hypothyroidism, unspecified: Secondary | ICD-10-CM | POA: Diagnosis not present

## 2015-02-06 DIAGNOSIS — I4891 Unspecified atrial fibrillation: Secondary | ICD-10-CM | POA: Diagnosis not present

## 2015-02-06 DIAGNOSIS — Z9181 History of falling: Secondary | ICD-10-CM | POA: Diagnosis not present

## 2015-02-06 DIAGNOSIS — Z79891 Long term (current) use of opiate analgesic: Secondary | ICD-10-CM | POA: Diagnosis not present

## 2015-02-06 DIAGNOSIS — I509 Heart failure, unspecified: Secondary | ICD-10-CM | POA: Diagnosis not present

## 2015-02-06 DIAGNOSIS — W19XXXD Unspecified fall, subsequent encounter: Secondary | ICD-10-CM | POA: Diagnosis not present

## 2015-02-06 DIAGNOSIS — Z7984 Long term (current) use of oral hypoglycemic drugs: Secondary | ICD-10-CM | POA: Diagnosis not present

## 2015-02-06 DIAGNOSIS — I110 Hypertensive heart disease with heart failure: Secondary | ICD-10-CM | POA: Diagnosis not present

## 2015-02-06 DIAGNOSIS — S329XXD Fracture of unspecified parts of lumbosacral spine and pelvis, subsequent encounter for fracture with routine healing: Secondary | ICD-10-CM | POA: Diagnosis not present

## 2015-02-06 DIAGNOSIS — Z8614 Personal history of Methicillin resistant Staphylococcus aureus infection: Secondary | ICD-10-CM | POA: Diagnosis not present

## 2015-02-06 DIAGNOSIS — E1140 Type 2 diabetes mellitus with diabetic neuropathy, unspecified: Secondary | ICD-10-CM | POA: Diagnosis not present

## 2015-02-06 DIAGNOSIS — E785 Hyperlipidemia, unspecified: Secondary | ICD-10-CM | POA: Diagnosis not present

## 2015-02-06 DIAGNOSIS — Z4889 Encounter for other specified surgical aftercare: Secondary | ICD-10-CM | POA: Diagnosis not present

## 2015-02-07 ENCOUNTER — Encounter: Payer: Self-pay | Admitting: *Deleted

## 2015-02-07 ENCOUNTER — Inpatient Hospital Stay
Admission: AD | Admit: 2015-02-07 | Discharge: 2015-02-12 | DRG: 629 | Disposition: A | Discharge status: Skilled Nursing Facility | Payer: PPO | Source: Ambulatory Visit | Attending: Internal Medicine | Admitting: Internal Medicine

## 2015-02-07 DIAGNOSIS — E785 Hyperlipidemia, unspecified: Secondary | ICD-10-CM | POA: Diagnosis present

## 2015-02-07 DIAGNOSIS — Z8674 Personal history of sudden cardiac arrest: Secondary | ICD-10-CM

## 2015-02-07 DIAGNOSIS — I509 Heart failure, unspecified: Secondary | ICD-10-CM | POA: Diagnosis not present

## 2015-02-07 DIAGNOSIS — Z888 Allergy status to other drugs, medicaments and biological substances status: Secondary | ICD-10-CM | POA: Diagnosis not present

## 2015-02-07 DIAGNOSIS — E1169 Type 2 diabetes mellitus with other specified complication: Secondary | ICD-10-CM | POA: Diagnosis present

## 2015-02-07 DIAGNOSIS — L97522 Non-pressure chronic ulcer of other part of left foot with fat layer exposed: Secondary | ICD-10-CM | POA: Diagnosis not present

## 2015-02-07 DIAGNOSIS — Z7984 Long term (current) use of oral hypoglycemic drugs: Secondary | ICD-10-CM | POA: Diagnosis not present

## 2015-02-07 DIAGNOSIS — Z885 Allergy status to narcotic agent status: Secondary | ICD-10-CM | POA: Diagnosis not present

## 2015-02-07 DIAGNOSIS — Z8673 Personal history of transient ischemic attack (TIA), and cerebral infarction without residual deficits: Secondary | ICD-10-CM | POA: Diagnosis not present

## 2015-02-07 DIAGNOSIS — Z87891 Personal history of nicotine dependence: Secondary | ICD-10-CM | POA: Diagnosis not present

## 2015-02-07 DIAGNOSIS — Z9861 Coronary angioplasty status: Secondary | ICD-10-CM | POA: Diagnosis not present

## 2015-02-07 DIAGNOSIS — L98499 Non-pressure chronic ulcer of skin of other sites with unspecified severity: Secondary | ICD-10-CM | POA: Diagnosis present

## 2015-02-07 DIAGNOSIS — I110 Hypertensive heart disease with heart failure: Secondary | ICD-10-CM | POA: Diagnosis not present

## 2015-02-07 DIAGNOSIS — M216X1 Other acquired deformities of right foot: Secondary | ICD-10-CM | POA: Diagnosis not present

## 2015-02-07 DIAGNOSIS — M6702 Short Achilles tendon (acquired), left ankle: Secondary | ICD-10-CM | POA: Diagnosis present

## 2015-02-07 DIAGNOSIS — Z7982 Long term (current) use of aspirin: Secondary | ICD-10-CM

## 2015-02-07 DIAGNOSIS — E11622 Type 2 diabetes mellitus with other skin ulcer: Secondary | ICD-10-CM | POA: Diagnosis not present

## 2015-02-07 DIAGNOSIS — I208 Other forms of angina pectoris: Secondary | ICD-10-CM | POA: Diagnosis not present

## 2015-02-07 DIAGNOSIS — Z01818 Encounter for other preprocedural examination: Secondary | ICD-10-CM | POA: Diagnosis not present

## 2015-02-07 DIAGNOSIS — E11621 Type 2 diabetes mellitus with foot ulcer: Secondary | ICD-10-CM | POA: Diagnosis not present

## 2015-02-07 DIAGNOSIS — I2510 Atherosclerotic heart disease of native coronary artery without angina pectoris: Secondary | ICD-10-CM | POA: Diagnosis not present

## 2015-02-07 DIAGNOSIS — Z79899 Other long term (current) drug therapy: Secondary | ICD-10-CM | POA: Diagnosis not present

## 2015-02-07 DIAGNOSIS — M2030 Hallux varus (acquired), unspecified foot: Secondary | ICD-10-CM | POA: Diagnosis not present

## 2015-02-07 DIAGNOSIS — M868X7 Other osteomyelitis, ankle and foot: Secondary | ICD-10-CM | POA: Diagnosis present

## 2015-02-07 DIAGNOSIS — R0602 Shortness of breath: Secondary | ICD-10-CM | POA: Diagnosis not present

## 2015-02-07 DIAGNOSIS — I482 Chronic atrial fibrillation: Secondary | ICD-10-CM | POA: Diagnosis not present

## 2015-02-07 DIAGNOSIS — L97529 Non-pressure chronic ulcer of other part of left foot with unspecified severity: Secondary | ICD-10-CM | POA: Diagnosis present

## 2015-02-07 DIAGNOSIS — E1142 Type 2 diabetes mellitus with diabetic polyneuropathy: Secondary | ICD-10-CM | POA: Diagnosis present

## 2015-02-07 DIAGNOSIS — R011 Cardiac murmur, unspecified: Secondary | ICD-10-CM | POA: Diagnosis not present

## 2015-02-07 DIAGNOSIS — L97509 Non-pressure chronic ulcer of other part of unspecified foot with unspecified severity: Secondary | ICD-10-CM | POA: Diagnosis present

## 2015-02-07 DIAGNOSIS — E039 Hypothyroidism, unspecified: Secondary | ICD-10-CM | POA: Diagnosis not present

## 2015-02-07 DIAGNOSIS — M2032 Hallux varus (acquired), left foot: Secondary | ICD-10-CM | POA: Diagnosis not present

## 2015-02-07 LAB — CBC
HCT: 37.3 % (ref 35.0–47.0)
HEMOGLOBIN: 12.1 g/dL (ref 12.0–16.0)
MCH: 27.3 pg (ref 26.0–34.0)
MCHC: 32.5 g/dL (ref 32.0–36.0)
MCV: 84.1 fL (ref 80.0–100.0)
Platelets: 231 10*3/uL (ref 150–440)
RBC: 4.44 MIL/uL (ref 3.80–5.20)
RDW: 16.7 % — ABNORMAL HIGH (ref 11.5–14.5)
WBC: 7.8 10*3/uL (ref 3.6–11.0)

## 2015-02-07 LAB — BASIC METABOLIC PANEL
ANION GAP: 8 (ref 5–15)
BUN: 12 mg/dL (ref 6–20)
CALCIUM: 8.9 mg/dL (ref 8.9–10.3)
CHLORIDE: 108 mmol/L (ref 101–111)
CO2: 23 mmol/L (ref 22–32)
Creatinine, Ser: 1.13 mg/dL — ABNORMAL HIGH (ref 0.44–1.00)
GFR calc non Af Amer: 45 mL/min — ABNORMAL LOW (ref >60)
GFR, EST AFRICAN AMERICAN: 52 mL/min — AB (ref >60)
GLUCOSE: 141 mg/dL — AB (ref 65–99)
Potassium: 4.3 mmol/L (ref 3.5–5.1)
Sodium: 139 mmol/L (ref 135–145)

## 2015-02-07 LAB — PROTIME-INR
INR: 0.94
Prothrombin Time: 12.8 seconds (ref 11.4–15.0)

## 2015-02-07 LAB — GLUCOSE, CAPILLARY
GLUCOSE-CAPILLARY: 162 mg/dL — AB (ref 65–99)
GLUCOSE-CAPILLARY: 118 mg/dL — AB (ref 65–99)

## 2015-02-07 MED ORDER — PANTOPRAZOLE SODIUM 40 MG PO TBEC
40.0000 mg | DELAYED_RELEASE_TABLET | Freq: Every day | ORAL | Status: DC
  Administered 2015-02-09 – 2015-02-12 (×4): 40 mg via ORAL
  Filled 2015-02-07 (×4): qty 1

## 2015-02-07 MED ORDER — INSULIN ASPART 100 UNIT/ML ~~LOC~~ SOLN
0.0000 [IU] | Freq: Three times a day (TID) | SUBCUTANEOUS | Status: DC
  Administered 2015-02-07: 2 [IU] via SUBCUTANEOUS
  Administered 2015-02-09: 1 [IU] via SUBCUTANEOUS
  Administered 2015-02-09: 2 [IU] via SUBCUTANEOUS
  Administered 2015-02-11 – 2015-02-12 (×3): 1 [IU] via SUBCUTANEOUS
  Filled 2015-02-07: qty 1
  Filled 2015-02-07: qty 2
  Filled 2015-02-07 (×3): qty 1
  Filled 2015-02-07: qty 2

## 2015-02-07 MED ORDER — BISACODYL 5 MG PO TBEC
5.0000 mg | DELAYED_RELEASE_TABLET | Freq: Every day | ORAL | Status: DC
  Administered 2015-02-09 – 2015-02-12 (×3): 5 mg via ORAL
  Filled 2015-02-07 (×3): qty 1

## 2015-02-07 MED ORDER — FERROUS SULFATE 325 (65 FE) MG PO TABS
325.0000 mg | ORAL_TABLET | Freq: Every day | ORAL | Status: DC
  Administered 2015-02-09 – 2015-02-11 (×3): 325 mg via ORAL
  Filled 2015-02-07 (×4): qty 1

## 2015-02-07 MED ORDER — ACETAMINOPHEN 500 MG PO TABS
500.0000 mg | ORAL_TABLET | ORAL | Status: DC | PRN
  Filled 2015-02-07: qty 1

## 2015-02-07 MED ORDER — GABAPENTIN 600 MG PO TABS
600.0000 mg | ORAL_TABLET | Freq: Two times a day (BID) | ORAL | Status: DC
  Administered 2015-02-07 – 2015-02-12 (×9): 600 mg via ORAL
  Filled 2015-02-07 (×9): qty 1

## 2015-02-07 MED ORDER — SIMVASTATIN 40 MG PO TABS
40.0000 mg | ORAL_TABLET | Freq: Every day | ORAL | Status: DC
  Administered 2015-02-08 – 2015-02-11 (×4): 40 mg via ORAL
  Filled 2015-02-07 (×4): qty 1

## 2015-02-07 MED ORDER — GLIPIZIDE ER 2.5 MG PO TB24
2.5000 mg | ORAL_TABLET | Freq: Every day | ORAL | Status: DC
  Administered 2015-02-09 – 2015-02-12 (×4): 2.5 mg via ORAL
  Filled 2015-02-07 (×4): qty 1

## 2015-02-07 MED ORDER — LISINOPRIL 10 MG PO TABS
10.0000 mg | ORAL_TABLET | Freq: Every day | ORAL | Status: DC
  Administered 2015-02-09: 10 mg via ORAL
  Filled 2015-02-07 (×2): qty 1

## 2015-02-07 MED ORDER — MORPHINE SULFATE (PF) 2 MG/ML IV SOLN
1.0000 mg | INTRAVENOUS | Status: DC | PRN
  Administered 2015-02-07 – 2015-02-10 (×5): 1 mg via INTRAVENOUS
  Filled 2015-02-07 (×6): qty 1

## 2015-02-07 MED ORDER — TEMAZEPAM 15 MG PO CAPS
30.0000 mg | ORAL_CAPSULE | Freq: Every day | ORAL | Status: DC
  Administered 2015-02-07 – 2015-02-11 (×5): 30 mg via ORAL
  Filled 2015-02-07 (×5): qty 2

## 2015-02-07 MED ORDER — OXYCODONE-ACETAMINOPHEN 5-325 MG PO TABS
1.0000 | ORAL_TABLET | ORAL | Status: DC | PRN
  Administered 2015-02-07 – 2015-02-12 (×13): 1 via ORAL
  Filled 2015-02-07 (×13): qty 1

## 2015-02-07 MED ORDER — LEVOTHYROXINE SODIUM 112 MCG PO TABS
112.0000 ug | ORAL_TABLET | Freq: Every day | ORAL | Status: DC
  Administered 2015-02-09 – 2015-02-12 (×4): 112 ug via ORAL
  Filled 2015-02-07 (×5): qty 1

## 2015-02-07 MED ORDER — AMIODARONE HCL 200 MG PO TABS
200.0000 mg | ORAL_TABLET | Freq: Every day | ORAL | Status: DC
  Administered 2015-02-09 – 2015-02-12 (×3): 200 mg via ORAL
  Filled 2015-02-07 (×4): qty 1

## 2015-02-07 MED ORDER — VITAMIN D 1000 UNITS PO TABS
2000.0000 [IU] | ORAL_TABLET | Freq: Every day | ORAL | Status: DC
  Administered 2015-02-09 – 2015-02-12 (×4): 2000 [IU] via ORAL
  Filled 2015-02-07 (×4): qty 2

## 2015-02-07 MED ORDER — INSULIN ASPART 100 UNIT/ML ~~LOC~~ SOLN: 0.0000 [IU] | Freq: Three times a day (TID) | SUBCUTANEOUS | Status: DC

## 2015-02-07 NOTE — Progress Notes (Addendum)
Left page for Dr. Posey Pronto. Patient needs something for pain.

## 2015-02-07 NOTE — Consult Note (Signed)
Patient Demographics  Sharon [name], is a 82 y.o. female   MRN: [id number]   DOB - 16-Mar-1933  Admit Date - 02/07/2015    Outpatient Primary MD for the patient is Rusty Aus., MD  Consult requested in the Hospital by Dustin Flock, MD, On 02/07/2015    Reason for consult chronic diabetic ulceration underneath the first metatarsal head left foot. Questionable soft tissue infection to the region. Deformity to the left great toe joint .   With History of -  Past Medical History  Diagnosis Date  . CHF (congestive heart failure) ([place])   . Diabetes mellitus without complication ([place])   . Hyperlipidemia   . Atrial fibrillation ([place])   . Hypothyroid   . Hypertension   . Stroke ([place])   . Neuropathy ([place])   . Anemia   . Edema     feet/ankles occas  . Osteomyelitis ([place])     left first metatarsal  . Hip fracture ([place])   . Cardiac arrest [facility])       Past Surgical History  Procedure Laterality Date  . Hemiarthroplasty hip Right   . Hemiarthroplasty hip Left   . Cholecystectomy    . Appendectomy    . Cardiac catheterization  08/25/13  . Coronary angioplasty    . Cataract extraction w/phaco Left 07/20/2014    Procedure: CATARACT EXTRACTION PHACO AND INTRAOCULAR LENS PLACEMENT (IOC);  Surgeon: Leandrew Koyanagi, MD;  Location: [facility] ORS;  Service: Ophthalmology;  Laterality: Left;  [place]  1:18                 AP     23.6             CDE   9.69      lot #[id number]  . Incision and drainage Left 10/11/2014    Procedure: Removal of infected tibial sessmoid;  Surgeon: Albertine Patricia, DPM;  Location: [facility] ORS;  Service: Podiatry;  Laterality: Left;    in for   No chief complaint on file.    HPI  Sharon [name]  is a 82 y.o. female, patient is diabetic with history of ulceration chronically underneath first metatarsal head. It is difficult  time controlling her diabetes and has multiple medical issues. She developed a diabetic ulceration with osteomyelitis to the sesamoid bone in that region which was removed approximately 2 months ago. This is still remained open and failed to heal due to gradual contracture deformity of the joint. Social History Social History  Substance Use Topics  . Smoking status: Former Smoker -- 0.50 packs/day for 7 years    Types: Cigarettes    Quit date: 05/30/1989  . Smokeless tobacco: Never Used  . Alcohol Use: No     Family History Family History  Problem Relation Age of Onset  . Heart attack Mother      Prior to Admission medications   Medication Sig Start Date End Date Taking? Authorizing Provider  bisacodyl (DULCOLAX) 5 MG EC tablet Take 1 tablet (5 mg total) by mouth daily. 11/08/14  Yes Rusty Aus, MD  capsaicin (ZOSTRIX) 0.025 % cream Apply topically 2 (two) times daily. 11/08/14  Yes Rusty Aus, MD  cholecalciferol  2000 UNITS TABS Take 1 tablet (2,000 Units total) by mouth daily. 11/08/14  Yes Rusty Aus, MD  ferrous sulfate 325 (65 FE) MG tablet Take 325 mg by mouth daily with breakfast.   Yes Historical Provider, MD  gabapentin (NEURONTIN) 600 MG tablet Take one tablet by mouth twice daily for neuropathy   Yes Historical Provider, MD  levothyroxine (SYNTHROID, LEVOTHROID) 112 MCG tablet Take one tablet by mouth once daily 30 minutes before breakfast once daily for hypothyroid   Yes Historical Provider, MD  lisinopril (PRINIVIL,ZESTRIL) 10 MG tablet Take 10 mg by mouth daily.   Yes Historical Provider, MD  simvastatin (ZOCOR) 40 MG tablet Take one tablet by mouth once daily at bedtime for cholesterol   Yes Historical Provider, MD  temazepam (RESTORIL) 30 MG capsule Take 1 capsule (30 mg total) by mouth at bedtime. 11/08/14  Yes Rusty Aus, MD  acetaminophen (TYLENOL) 500 MG tablet Take 500 mg by mouth every 4 (four) hours as needed for mild pain or moderate pain.     Historical Provider, MD  amiodarone (PACERONE) 200 MG tablet Take one tablet by mouth once daily for AFIB    Historical Provider, MD  aspirin 81 MG tablet Take 81 mg by mouth daily.    Historical Provider, MD  doxycycline (VIBRA-TABS) 100 MG tablet Take 1 tablet (100 mg total) by mouth 2 (two) times daily. Patient not taking: Reported on 02/07/2015 11/08/14   Rusty Aus, MD  GLIPIZIDE XL 2.5 MG 24 hr tablet Take 2.5 mg by mouth daily. Reported on 02/07/2015 10/14/14   Historical Provider, MD  metroNIDAZOLE (FLAGYL) 500 MG tablet Take 1 tablet (500 mg total) by mouth every 8 (eight) hours. Patient not taking: Reported on 02/07/2015 11/08/14   Rusty Aus, MD  Multiple Vitamins-Minerals (PRESERVISION AREDS 2 PO) Take 2 tablets by mouth daily. Reported on 02/07/2015    Historical Provider, MD  pantoprazole (PROTONIX) 40 MG tablet Take 1 tablet (40 mg total) by mouth daily. Patient not taking: Reported on 02/07/2015 11/08/14   Rusty Aus, MD    Anti-infectives    None      Scheduled Meds: . amiodarone  200 mg Oral Daily  . [START ON 02/08/2015] bisacodyl  5 mg Oral Daily  . cholecalciferol  2,000 Units Oral Daily  . [START ON 02/08/2015] ferrous sulfate  325 mg Oral Q breakfast  . gabapentin  600 mg Oral BID  . [START ON 02/08/2015] glipiZIDE  2.5 mg Oral Q breakfast  . insulin aspart  0-9 Units Subcutaneous TID WC  . [START ON 02/08/2015] levothyroxine  112 mcg Oral QAC breakfast  . [START ON 02/08/2015] lisinopril  10 mg Oral Daily  . pantoprazole  40 mg Oral Daily  . [START ON 02/08/2015] simvastatin  40 mg Oral q1800  . temazepam  30 mg Oral QHS   Continuous Infusions:  PRN Meds:.acetaminophen, morphine injection, oxyCODONE-acetaminophen  Allergies  Allergen Reactions  . Codeine Other (See Comments)    Other reaction(s): Other (See Comments) Patient states she did not feel herself when she took it. unknown  . Cymbalta [Duloxetine Hcl] Other (See Comments)    Confusion,  disorientation  . Duloxetine     Other reaction(s): Other (See Comments) Confusion, disorientation  . Lyrica [Pregabalin] Other (See Comments)    Patient and son states this makes the patient confused.    Physical Exam  Vitals  Blood pressure 138/84, pulse 62, temperature 97.4 F (36.3 C), temperature source Axillary,  resp. rate 18, height '5\' 5"'$  (1.651 m), weight 58.7 kg (129 lb 6.6 oz), SpO2 99 %.  Lower Extremity exam:  Vascular: DP PT pulses are palpable bilateral  Dermatological: Chronic ulceration submetatarsal one left foot with some erythema to the plantar aspect.   Neurological: significant peripheral neuropathy bilateral  Ortho: hallux malleus is noted to the left first metatarsophalangeal joint with contracture at the IPJ and the MTPJ. Extensor hallucis longus tendon has tightened and lead to contracture across the joint.  Data Review  CBC No results for input(s): WBC, HGB, HCT, PLT, MCV, MCH, MCHC, RDW, LYMPHSABS, MONOABS, EOSABS, BASOSABS, BANDABS in the last 168 hours.  Invalid input(s): NEUTRABS, BANDSABD ------------------------------------------------------------------------------------------------------------------  Chemistries   Coagulation profile No results for input(s): INR, PROTIME in the last 168 hours. ------------------------------------------------------------------------------------------------------------------- No results for input(s): DDIMER in the last 72 hours. -------------------------------------------------------------------------------------------------------------------  ---------------------------------------------------------------------------------------------------------------  Urinalysis    Component Value Date/Time   COLORURINE YELLOW* 11/05/2014 1142   COLORURINE Yellow 08/18/2013 0253   APPEARANCEUR CLEAR* 11/05/2014 1142   APPEARANCEUR Hazy 08/18/2013 0253   LABSPEC 1.013 11/05/2014 1142   LABSPEC 1.012 08/18/2013 0253    PHURINE 5.0 11/05/2014 1142   PHURINE 5.0 08/18/2013 0253   GLUCOSEU NEGATIVE 11/05/2014 1142   GLUCOSEU >=500 08/18/2013 0253   HGBUR NEGATIVE 11/05/2014 1142   HGBUR Negative 08/18/2013 0253   BILIRUBINUR NEGATIVE 11/05/2014 1142   BILIRUBINUR Negative 08/18/2013 0253   KETONESUR NEGATIVE 11/05/2014 1142   KETONESUR Negative 08/18/2013 0253   PROTEINUR NEGATIVE 11/05/2014 1142   PROTEINUR 100 mg/dL 08/18/2013 0253   NITRITE NEGATIVE 11/05/2014 1142   NITRITE Negative 08/18/2013 0253   LEUKOCYTESUR TRACE* 11/05/2014 1142   LEUKOCYTESUR Trace 08/18/2013 0253     Assessment & Plan: Chronic diabetic ulceration with potential infection to the deeper tissues left foot secondary to structural deformity and contracture to the toe. It is osteomyelitis to the sesamoid in that joint region. Plan: We will surgically try to repair the joint and lengthen the Achilles tendon tomorrow to try to reduce some of the stress across the forefoot and allow long-term healing to the region. Orders written for consent form and also nothing by mouth after midnight tonight.   Active Problems:   Foot ulcer (Person)      Family Communication: Plan discussed with patient and  family    Perry Mount M.D on 02/07/2015 at 5:35 PM  Thank you for the consult, we will follow the patient with you in the Hospital.

## 2015-02-07 NOTE — H&P (Signed)
[facility] [place] NAME: Sharon [name]    MR#:  [id number]  DATE OF BIRTH:  [birth year]  DATE OF ADMISSION:  02/07/2015  PRIMARY CARE PHYSICIAN: Rusty Aus., MD   REQUESTING/REFERRING PHYSICIAN: Troxler MD  CHIEF COMPLAINT:   Foot ulcer   HISTORY OF PRESENT ILLNESS: Sharon [name]  is a 82 y.o. female with a known history of  congestive heart failure, diabetes, hyperlipidemia, atrial fibrillation, hypothyroidism, hypertension, previous history of CVA and neuropathy who has been followed outpatient by podiatry. They requested patient be admitted for lower extremity ulcer and infection. They wanted patient admitted and started on IV antibiotics for surgery tomorrow. He otherwise denies any chest pain shortness of breath no palpitations.  PAST MEDICAL HISTORY:   Past Medical History  Diagnosis Date  . CHF (congestive heart failure) ([place])   . Diabetes mellitus without complication ([place])   . Hyperlipidemia   . Atrial fibrillation ([place])   . Hypothyroid   . Hypertension   . Stroke ([place])   . Neuropathy ([place])   . Anemia   . Edema     feet/ankles occas  . Osteomyelitis ([place])     left first metatarsal  . Hip fracture ([place])   . Cardiac arrest [facility])     PAST SURGICAL HISTORY:  Past Surgical History  Procedure Laterality Date  . Hemiarthroplasty hip Right   . Hemiarthroplasty hip Left   . Cholecystectomy    . Appendectomy    . Cardiac catheterization  08/25/13  . Coronary angioplasty    . Cataract extraction w/phaco Left 07/20/2014    Procedure: CATARACT EXTRACTION PHACO AND INTRAOCULAR LENS PLACEMENT (IOC);  Surgeon: Leandrew Koyanagi, MD;  Location: [facility] ORS;  Service: Ophthalmology;  Laterality: Left;  [place]  1:18                 AP     23.6             CDE   9.69      lot #[id number]  . Incision and drainage Left 10/11/2014    Procedure: Removal of infected tibial sessmoid;  Surgeon: Albertine Patricia, DPM;  Location: [facility] ORS;   Service: Podiatry;  Laterality: Left;    SOCIAL HISTORY:  Social History  Substance Use Topics  . Smoking status: Former Smoker -- 0.50 packs/day for 7 years    Types: Cigarettes    Quit date: 05/30/1989  . Smokeless tobacco: Never Used  . Alcohol Use: No    FAMILY HISTORY:  Family History  Problem Relation Age of Onset  . Heart attack Mother     DRUG ALLERGIES:  Allergies  Allergen Reactions  . Codeine Other (See Comments)    Other reaction(s): Other (See Comments) Patient states she did not feel herself when she took it. unknown  . Cymbalta [Duloxetine Hcl] Other (See Comments)    Confusion, disorientation  . Duloxetine     Other reaction(s): Other (See Comments) Confusion, disorientation  . Lyrica [Pregabalin] Other (See Comments)    Patient and son states this makes the patient confused.    REVIEW OF SYSTEMS:   CONSTITUTIONAL: No fever, fatigue or weakness.  EYES: No blurred or double vision.  EARS, NOSE, AND THROAT: No tinnitus or ear pain.  RESPIRATORY: No cough, shortness of breath, wheezing or hemoptysis.  CARDIOVASCULAR: No chest pain, orthopnea, edema.  GASTROINTESTINAL: No nausea, vomiting, diarrhea or abdominal pain.  GENITOURINARY: No dysuria, hematuria.  ENDOCRINE: No polyuria, nocturia,  HEMATOLOGY: No anemia, easy bruising or bleeding SKIN: Positive for foot ulcer MUSCULOSKELETAL: No joint pain or arthritis.   NEUROLOGIC: No tingling, numbness, weakness.  PSYCHIATRY: No anxiety or depression.   MEDICATIONS AT HOME:  Prior to Admission medications   Medication Sig Start Date End Date Taking? Authorizing Provider  bisacodyl (DULCOLAX) 5 MG EC tablet Take 1 tablet (5 mg total) by mouth daily. 11/08/14  Yes Rusty Aus, MD  capsaicin (ZOSTRIX) 0.025 % cream Apply topically 2 (two) times daily. 11/08/14  Yes Rusty Aus, MD  cholecalciferol 2000 UNITS TABS Take 1 tablet (2,000 Units total) by mouth daily. 11/08/14  Yes Rusty Aus, MD  ferrous  sulfate 325 (65 FE) MG tablet Take 325 mg by mouth daily with breakfast.   Yes Historical Provider, MD  gabapentin (NEURONTIN) 600 MG tablet Take one tablet by mouth twice daily for neuropathy   Yes Historical Provider, MD  levothyroxine (SYNTHROID, LEVOTHROID) 112 MCG tablet Take one tablet by mouth once daily 30 minutes before breakfast once daily for hypothyroid   Yes Historical Provider, MD  lisinopril (PRINIVIL,ZESTRIL) 10 MG tablet Take 10 mg by mouth daily.   Yes Historical Provider, MD  simvastatin (ZOCOR) 40 MG tablet Take one tablet by mouth once daily at bedtime for cholesterol   Yes Historical Provider, MD  temazepam (RESTORIL) 30 MG capsule Take 1 capsule (30 mg total) by mouth at bedtime. 11/08/14  Yes Rusty Aus, MD  acetaminophen (TYLENOL) 500 MG tablet Take 500 mg by mouth every 4 (four) hours as needed for mild pain or moderate pain.    Historical Provider, MD  amiodarone (PACERONE) 200 MG tablet Take one tablet by mouth once daily for AFIB    Historical Provider, MD  aspirin 81 MG tablet Take 81 mg by mouth daily.    Historical Provider, MD  doxycycline (VIBRA-TABS) 100 MG tablet Take 1 tablet (100 mg total) by mouth 2 (two) times daily. Patient not taking: Reported on 02/07/2015 11/08/14   Rusty Aus, MD  GLIPIZIDE XL 2.5 MG 24 hr tablet Take 2.5 mg by mouth daily. Reported on 02/07/2015 10/14/14   Historical Provider, MD  metroNIDAZOLE (FLAGYL) 500 MG tablet Take 1 tablet (500 mg total) by mouth every 8 (eight) hours. Patient not taking: Reported on 02/07/2015 11/08/14   Rusty Aus, MD  Multiple Vitamins-Minerals (PRESERVISION AREDS 2 PO) Take 2 tablets by mouth daily. Reported on 02/07/2015    Historical Provider, MD  pantoprazole (PROTONIX) 40 MG tablet Take 1 tablet (40 mg total) by mouth daily. Patient not taking: Reported on 02/07/2015 11/08/14   Rusty Aus, MD      PHYSICAL EXAMINATION:   VITAL SIGNS: Blood pressure 138/84, pulse 62, temperature 97.4 F (36.3  C), temperature source Axillary, resp. rate 18, height '5\' 5"'$  (3.532 m), weight 58.7 kg (129 lb 6.6 oz), SpO2 99 %.  GENERAL:  82 y.o.-year-old patient lying in the bed with no acute distress.  EYES: Pupils equal, round, reactive to light and accommodation. No scleral icterus. Extraocular muscles intact.  HEENT: Head atraumatic, normocephalic. Oropharynx and nasopharynx clear.  NECK:  Supple, no jugular venous distention. No thyroid enlargement, no tenderness.  LUNGS: Normal breath sounds bilaterally, no wheezing, rales,rhonchi or crepitation. No use of accessory muscles of respiration.  CARDIOVASCULAR: S1, S2 normal. Positive systolic murmur, rubs, or gallops.  ABDOMEN: Soft, nontender, nondistended. Bowel sounds present. No organomegaly or mass.  EXTREMITIES: No pedal edema, cyanosis, or clubbing. Has dressing in  place in the foot NEUROLOGIC: Cranial nerves II through XII are intact. Muscle strength 5/5 in all extremities. Sensation intact. Gait not checked.  PSYCHIATRIC: The patient is alert and oriented x 3.  SKIN: No obvious rash, lesion, or ulcer.   LABORATORY PANEL:   CBC No results for input(s): WBC, HGB, HCT, PLT, MCV, MCH, MCHC, RDW, LYMPHSABS, MONOABS, EOSABS, BASOSABS, BANDABS in the last 168 hours.  Invalid input(s): NEUTRABS, BANDSABD ------------------------------------------------------------------------------------------------------------------  Chemistries  No results for input(s): NA, K, CL, CO2, GLUCOSE, BUN, CREATININE, CALCIUM, MG, AST, ALT, ALKPHOS, BILITOT in the last 168 hours.  Invalid input(s): GFRCGP ------------------------------------------------------------------------------------------------------------------ CrCl cannot be calculated (Patient has no serum creatinine result on file.). ------------------------------------------------------------------------------------------------------------------ No results for input(s): TSH, T4TOTAL, T3FREE, THYROIDAB in  the last 72 hours.  Invalid input(s): FREET3   Coagulation profile No results for input(s): INR, PROTIME in the last 168 hours. ------------------------------------------------------------------------------------------------------------------- No results for input(s): DDIMER in the last 72 hours. -------------------------------------------------------------------------------------------------------------------  Cardiac Enzymes No results for input(s): CKMB, TROPONINI, MYOGLOBIN in the last 168 hours.  Invalid input(s): CK ------------------------------------------------------------------------------------------------------------------ Invalid input(s): POCBNP  ---------------------------------------------------------------------------------------------------------------  Urinalysis    Component Value Date/Time   COLORURINE YELLOW* 11/05/2014 1142   COLORURINE Yellow 08/18/2013 0253   APPEARANCEUR CLEAR* 11/05/2014 1142   APPEARANCEUR Hazy 08/18/2013 0253   LABSPEC 1.013 11/05/2014 1142   LABSPEC 1.012 08/18/2013 0253   PHURINE 5.0 11/05/2014 1142   PHURINE 5.0 08/18/2013 0253   GLUCOSEU NEGATIVE 11/05/2014 1142   GLUCOSEU >=500 08/18/2013 0253   HGBUR NEGATIVE 11/05/2014 1142   HGBUR Negative 08/18/2013 0253   BILIRUBINUR NEGATIVE 11/05/2014 1142   BILIRUBINUR Negative 08/18/2013 0253   KETONESUR NEGATIVE 11/05/2014 1142   KETONESUR Negative 08/18/2013 0253   PROTEINUR NEGATIVE 11/05/2014 1142   PROTEINUR 100 mg/dL 08/18/2013 0253   NITRITE NEGATIVE 11/05/2014 1142   NITRITE Negative 08/18/2013 0253   LEUKOCYTESUR TRACE* 11/05/2014 1142   LEUKOCYTESUR Trace 08/18/2013 0253     RADIOLOGY: No results found.  EKG: Orders placed or performed during the hospital encounter of 11/05/14  . EKG 12-Lead  . EKG 12-Lead  . EKG 12-Lead  . EKG 12-Lead  . EKG 12-Lead  . EKG 12-Lead    IMPRESSION AND PLAN: Patient is a 82 year old white female with foot ulcer and  infection  1.  Left foot diabetic ulcer with infection: This time will put her on vancomycin and Zosyn. Per podiatry no cultures needed. Plan for or tomorrow  2. Chronic atrial fibrillation continue amiodarone as taking at home hold aspirin for planned surgery  3. Diabetes type 2 continue glipizide and I'll place her on sliding scale insulin  4. Hypertension continue lisinopril  5. Hypothyrddism  continue Synthroid as taking at home  6. Miscellaneous SCDs for DVT prophylaxis for now in light of planned surgery tomorrow can do heparin or Lovenox postop  All the records are reviewed and case discussed with ED provider. Management plans discussed with the patient, family and they are in agreement.  CODE STATUS:    Code Status Orders        Start     Ordered   02/07/15 1606  Full code   Continuous     02/07/15 1605    Code Status History    Date Active Date Inactive Code Status Order ID Comments User Context   11/05/2014  3:51 PM 11/09/2014  3:36 PM Full Code [id number]  Gladstone Lighter, MD Inpatient   10/10/2014 11:12 AM 10/17/2014  5:50 PM Full Code [id number]  Snehalatha  Vianne Bulls, MD Inpatient       TOTAL TIME TAKING CARE OF THIS PATIENT: 81mnutes.    PDustin FlockM.D on 02/07/2015 at 4:09 PM  Between 7am to 6pm - Pager - [phone]  After 6pm go to [email or web address] - password [email or web address] [facility] E[facility] Falls[facility]ospitalists  Office  [phone] CC: Primary care physician; MRusty Aus, MD

## 2015-02-08 ENCOUNTER — Encounter
Admission: AD | Disposition: A | Discharge status: Skilled Nursing Facility | Payer: Self-pay | Source: Ambulatory Visit | Attending: Internal Medicine

## 2015-02-08 ENCOUNTER — Inpatient Hospital Stay: Payer: PPO | Admitting: Anesthesiology

## 2015-02-08 ENCOUNTER — Inpatient Hospital Stay: Admission: RE | Admit: 2015-02-08 | Payer: PPO | Source: Ambulatory Visit | Admitting: Podiatry

## 2015-02-08 ENCOUNTER — Encounter: Payer: Self-pay | Admitting: *Deleted

## 2015-02-08 DIAGNOSIS — L089 Local infection of the skin and subcutaneous tissue, unspecified: Secondary | ICD-10-CM | POA: Diagnosis not present

## 2015-02-08 DIAGNOSIS — I10 Essential (primary) hypertension: Secondary | ICD-10-CM | POA: Diagnosis not present

## 2015-02-08 DIAGNOSIS — E1142 Type 2 diabetes mellitus with diabetic polyneuropathy: Secondary | ICD-10-CM | POA: Diagnosis not present

## 2015-02-08 DIAGNOSIS — M2032 Hallux varus (acquired), left foot: Secondary | ICD-10-CM | POA: Diagnosis not present

## 2015-02-08 DIAGNOSIS — L97522 Non-pressure chronic ulcer of other part of left foot with fat layer exposed: Secondary | ICD-10-CM | POA: Diagnosis not present

## 2015-02-08 DIAGNOSIS — I4891 Unspecified atrial fibrillation: Secondary | ICD-10-CM | POA: Diagnosis not present

## 2015-02-08 DIAGNOSIS — E118 Type 2 diabetes mellitus with unspecified complications: Secondary | ICD-10-CM | POA: Diagnosis not present

## 2015-02-08 HISTORY — PX: ACHILLES TENDON SURGERY: SHX542

## 2015-02-08 HISTORY — PX: HALLUX VALGUS AKIN: SHX6622

## 2015-02-08 LAB — CBC
HEMATOCRIT: 33.8 % — AB (ref 35.0–47.0)
Hemoglobin: 11.1 g/dL — ABNORMAL LOW (ref 12.0–16.0)
MCH: 27.9 pg (ref 26.0–34.0)
MCHC: 32.8 g/dL (ref 32.0–36.0)
MCV: 84.9 fL (ref 80.0–100.0)
Platelets: 217 10*3/uL (ref 150–440)
RBC: 3.98 MIL/uL (ref 3.80–5.20)
RDW: 16.5 % — AB (ref 11.5–14.5)
WBC: 6.3 10*3/uL (ref 3.6–11.0)

## 2015-02-08 LAB — GLUCOSE, CAPILLARY
GLUCOSE-CAPILLARY: 110 mg/dL — AB (ref 65–99)
GLUCOSE-CAPILLARY: 113 mg/dL — AB (ref 65–99)
GLUCOSE-CAPILLARY: 119 mg/dL — AB (ref 65–99)
Glucose-Capillary: 129 mg/dL — ABNORMAL HIGH (ref 65–99)
Glucose-Capillary: 89 mg/dL (ref 65–99)

## 2015-02-08 LAB — BASIC METABOLIC PANEL
ANION GAP: 7 (ref 5–15)
BUN: 18 mg/dL (ref 6–20)
CALCIUM: 8.4 mg/dL — AB (ref 8.9–10.3)
CO2: 23 mmol/L (ref 22–32)
Chloride: 110 mmol/L (ref 101–111)
Creatinine, Ser: 1.21 mg/dL — ABNORMAL HIGH (ref 0.44–1.00)
GFR, EST AFRICAN AMERICAN: 48 mL/min — AB (ref >60)
GFR, EST NON AFRICAN AMERICAN: 41 mL/min — AB (ref >60)
Glucose, Bld: 134 mg/dL — ABNORMAL HIGH (ref 65–99)
Potassium: 4.2 mmol/L (ref 3.5–5.1)
Sodium: 140 mmol/L (ref 135–145)

## 2015-02-08 LAB — MRSA PCR SCREENING: MRSA by PCR: NEGATIVE

## 2015-02-08 SURGERY — CORRECTION, HALLUX VALGUS, WITH AKIN OSTEOTOMY
Anesthesia: General | Laterality: Left | Wound class: Clean

## 2015-02-08 MED ORDER — FENTANYL CITRATE (PF) 100 MCG/2ML IJ SOLN
INTRAMUSCULAR | Status: DC | PRN
  Administered 2015-02-08: 25 ug via INTRAVENOUS

## 2015-02-08 MED ORDER — VANCOMYCIN HCL IN DEXTROSE 1-5 GM/200ML-% IV SOLN
1000.0000 mg | Freq: Once | INTRAVENOUS | Status: AC
  Administered 2015-02-08: 1000 mg via INTRAVENOUS
  Filled 2015-02-08: qty 200

## 2015-02-08 MED ORDER — ONDANSETRON HCL 4 MG/2ML IJ SOLN: 4.0000 mg | Freq: Once | INTRAMUSCULAR | Status: DC | PRN

## 2015-02-08 MED ORDER — HYDROMORPHONE HCL 1 MG/ML IJ SOLN
0.5000 mg | Freq: Once | INTRAMUSCULAR | Status: AC
  Administered 2015-02-08: 0.5 mg via INTRAVENOUS
  Filled 2015-02-08: qty 1

## 2015-02-08 MED ORDER — BUPIVACAINE HCL (PF) 0.5 % IJ SOLN
INTRAMUSCULAR | Status: AC
Start: 2015-02-08 — End: 2015-02-08
  Filled 2015-02-08: qty 30

## 2015-02-08 MED ORDER — LIDOCAINE HCL (PF) 1 % IJ SOLN
INTRAMUSCULAR | Status: AC
  Filled 2015-02-08: qty 30

## 2015-02-08 MED ORDER — PROPOFOL 10 MG/ML IV BOLUS
INTRAVENOUS | Status: DC | PRN
  Administered 2015-02-08: 100 mg via INTRAVENOUS

## 2015-02-08 MED ORDER — ENOXAPARIN SODIUM 40 MG/0.4ML ~~LOC~~ SOLN
40.0000 mg | SUBCUTANEOUS | Status: DC
  Administered 2015-02-08 – 2015-02-12 (×5): 40 mg via SUBCUTANEOUS
  Filled 2015-02-08 (×5): qty 0.4

## 2015-02-08 MED ORDER — SODIUM CHLORIDE 0.9 % IV SOLN
INTRAVENOUS | Status: DC
  Administered 2015-02-08 – 2015-02-09 (×2): via INTRAVENOUS

## 2015-02-08 MED ORDER — EPHEDRINE SULFATE 50 MG/ML IJ SOLN
INTRAMUSCULAR | Status: DC | PRN
  Administered 2015-02-08 (×5): 10 mg via INTRAVENOUS

## 2015-02-08 MED ORDER — FENTANYL CITRATE (PF) 100 MCG/2ML IJ SOLN: 25.0000 ug | INTRAMUSCULAR | Status: DC | PRN

## 2015-02-08 MED ORDER — PHENYLEPHRINE HCL 10 MG/ML IJ SOLN
INTRAMUSCULAR | Status: DC | PRN
  Administered 2015-02-08: 100 ug via INTRAVENOUS

## 2015-02-08 MED ORDER — PIPERACILLIN-TAZOBACTAM 3.375 G IVPB
3.3750 g | Freq: Three times a day (TID) | INTRAVENOUS | Status: DC
  Administered 2015-02-08 – 2015-02-11 (×9): 3.375 g via INTRAVENOUS
  Filled 2015-02-08 (×11): qty 50

## 2015-02-08 MED ORDER — BUPIVACAINE HCL (PF) 0.5 % IJ SOLN
INTRAMUSCULAR | Status: DC | PRN
Start: 2015-02-08 — End: 2015-02-08
  Administered 2015-02-08: 7 mL

## 2015-02-08 MED ORDER — LIDOCAINE-EPINEPHRINE 1 %-1:100000 IJ SOLN
INTRAMUSCULAR | Status: AC
  Filled 2015-02-08: qty 1

## 2015-02-08 MED ORDER — VANCOMYCIN HCL IN DEXTROSE 750-5 MG/150ML-% IV SOLN
750.0000 mg | INTRAVENOUS | Status: DC
  Administered 2015-02-09 – 2015-02-11 (×3): 750 mg via INTRAVENOUS
  Filled 2015-02-08 (×3): qty 150

## 2015-02-08 MED ORDER — LIDOCAINE HCL (CARDIAC) 20 MG/ML IV SOLN
INTRAVENOUS | Status: DC | PRN
  Administered 2015-02-08: 20 mg via INTRAVENOUS

## 2015-02-08 MED ORDER — LIDOCAINE-EPINEPHRINE 1 %-1:100000 IJ SOLN
INTRAMUSCULAR | Status: DC | PRN
  Administered 2015-02-08: 4 mL

## 2015-02-08 SURGICAL SUPPLY — 49 items
BAG COUNTER SPONGE EZ (MISCELLANEOUS) IMPLANT
BANDAGE ELASTIC 4 LF NS (GAUZE/BANDAGES/DRESSINGS) ×3 IMPLANT
BANDAGE STRETCH 3X4.1 STRL (GAUZE/BANDAGES/DRESSINGS) ×3 IMPLANT
BENZOIN TINCTURE PRP APPL 2/3 (GAUZE/BANDAGES/DRESSINGS) ×3 IMPLANT
BLADE OSC/SAGITTAL MD 9X18.5 (BLADE) ×3 IMPLANT
BLADE SURG 15 STRL LF DISP TIS (BLADE) ×2 IMPLANT
BLADE SURG 15 STRL SS (BLADE) ×4
BLADE SURG MINI STRL (BLADE) ×3 IMPLANT
BNDG ESMARK 4X12 TAN STRL LF (GAUZE/BANDAGES/DRESSINGS) ×3 IMPLANT
BNDG GAUZE 4.5X4.1 6PLY STRL (MISCELLANEOUS) ×3 IMPLANT
CANISTER SUCT 1200ML W/VALVE (MISCELLANEOUS) ×3 IMPLANT
CLOSURE WOUND 1/4X4 (GAUZE/BANDAGES/DRESSINGS) ×1
COUNTER SPONGE BAG EZ (MISCELLANEOUS)
COVER PIN YLW 0.028-062 (MISCELLANEOUS) ×3 IMPLANT
CUFF TOURN 18 STER (MISCELLANEOUS) ×3 IMPLANT
CUFF TOURN DUAL PL 12 NO SLV (MISCELLANEOUS) ×3 IMPLANT
DRAPE FLUOR MINI C-ARM 54X84 (DRAPES) ×3 IMPLANT
DURAPREP 26ML APPLICATOR (WOUND CARE) ×3 IMPLANT
ELECT REM PT RETURN 9FT ADLT (ELECTROSURGICAL) ×3
ELECTRODE REM PT RTRN 9FT ADLT (ELECTROSURGICAL) ×1 IMPLANT
GAUZE PETRO XEROFOAM 1X8 (MISCELLANEOUS) ×3 IMPLANT
GAUZE SPONGE 4X4 12PLY STRL (GAUZE/BANDAGES/DRESSINGS) ×3 IMPLANT
GLOVE BIO SURGEON STRL SZ8 (GLOVE) ×3 IMPLANT
GLOVE INDICATOR 7.5 STRL GRN (GLOVE) ×3 IMPLANT
GOWN STRL REUS W/ TWL LRG LVL3 (GOWN DISPOSABLE) ×2 IMPLANT
GOWN STRL REUS W/TWL LRG LVL3 (GOWN DISPOSABLE) ×4
KIT RM TURNOVER STRD PROC AR (KITS) ×3 IMPLANT
LABEL OR SOLS (LABEL) ×3 IMPLANT
NDL SAFETY 18GX1.5 (NEEDLE) ×3 IMPLANT
NEEDLE FILTER BLUNT 18X 1/2SAF (NEEDLE) ×2
NEEDLE FILTER BLUNT 18X1 1/2 (NEEDLE) ×1 IMPLANT
NEEDLE HYPO 25X1 1.5 SAFETY (NEEDLE) ×9 IMPLANT
NS IRRIG 500ML POUR BTL (IV SOLUTION) ×3 IMPLANT
PACK EXTREMITY ARMC (MISCELLANEOUS) ×3 IMPLANT
PENCIL ELECTRO HAND CTR (MISCELLANEOUS) ×3 IMPLANT
RASP SM TEAR CROSS CUT (RASP) ×3 IMPLANT
SPLINT FAST PLASTER 5X30 (CAST SUPPLIES) ×2
SPLINT PLASTER CAST FAST 5X30 (CAST SUPPLIES) ×1 IMPLANT
STOCKINETTE STRL 6IN 960660 (GAUZE/BANDAGES/DRESSINGS) ×3 IMPLANT
STRIP CLOSURE SKIN 1/4X4 (GAUZE/BANDAGES/DRESSINGS) ×2 IMPLANT
SUT ORTHOCORD W/MULTIPK NDL (SUTURE) ×3 IMPLANT
SUT VIC AB 2-0 SH 27 (SUTURE) ×2
SUT VIC AB 2-0 SH 27XBRD (SUTURE) ×1 IMPLANT
SUT VIC AB 3-0 SH 27 (SUTURE) ×2
SUT VIC AB 3-0 SH 27X BRD (SUTURE) ×1 IMPLANT
SUT VIC AB 4-0 FS2 27 (SUTURE) ×3 IMPLANT
SYRINGE 10CC LL (SYRINGE) ×3 IMPLANT
WIRE Z .045 C-WIRE SPADE TIP (WIRE) IMPLANT
WIRE Z .062 C-WIRE SPADE TIP (WIRE) ×3 IMPLANT

## 2015-02-08 NOTE — Anesthesia Postprocedure Evaluation (Signed)
Anesthesia Post Note  Patient: Sharon [name]  Procedure(s) Performed: Procedure(s) (LRB): HALLUX VALGUS AKIN/ KELLER (Left) ACHILLES LENGTHENING/KIDNER (Left)  Patient location during evaluation: PACU Anesthesia Type: General Level of consciousness: awake and alert Pain management: pain level controlled Vital Signs Assessment: post-procedure vital signs reviewed and stable Respiratory status: spontaneous breathing, nonlabored ventilation, respiratory function stable and patient connected to nasal cannula oxygen Cardiovascular status: blood pressure returned to baseline and stable Postop Assessment: no signs of nausea or vomiting Anesthetic complications: no    Last Vitals:  Filed Vitals:   02/08/15 1303 02/08/15 1309  BP: 111/69 112/69  Pulse: 73 73  Temp:  36.8 C  Resp: 17 20    Last Pain:  Filed Vitals:   02/08/15 1310  PainSc: Asleep                 Martha Clan

## 2015-02-08 NOTE — Care Management Note (Signed)
Case Management Note  Patient Details  Name: [name] MRN: [id number] Date of Birth: 1933/12/26  Subjective/Objective:                  Met with patient prior to surgery today. She is from home alone. She feels that if she were to need IV ABX at home she would be able to manage. She has used [place] in the past for home health services. She has 2 sons but they both lives 56 minutes-1 hour from her address. One son can provide transportation per patient. She is from home where she was living independently/drives. She has a walker and cane available in the home. Her PCP is Dr. Emily Filbert.  Action/Plan: List of home health agencies left with patient. RNCM will continue to follow. PT would be helpful for discharge planning.   Expected Discharge Date:                  Expected Discharge Plan:     In-House Referral:     Discharge planning Services  CM Consult  Post Acute Care Choice:  Home Health, Durable Medical Equipment Choice offered to:  Patient  DME Arranged:    DME Agency:     HH Arranged:    [place] Agency:     Status of Service:  In process, will continue to follow  Medicare Important Message Given:    Date Medicare IM Given:    Medicare IM give by:    Date Additional Medicare IM Given:    Additional Medicare Important Message give by:     If discussed at [place] of Stay Meetings, dates discussed:    Additional Comments:  Marshell Garfinkel, RN 02/08/2015, 11:21 AM

## 2015-02-08 NOTE — Op Note (Signed)
Operative note   Surgeon: Dr. Albertine Patricia, DPM.    Assistant: None    Preop diagnosis: #1 tight Achilles tendon left foot. 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 Achilles tendon lengthening left foot   2. Arthroplasty first metatarsal phalangeal joint left foot with proximal phalanx base resection. K wire fixation.   3. Lengthening extensor hallucis longus tendon     EBL: Less than 5 cc    Anesthesia:general with local    Hemostasis: Thigh tourniquet 275 mmHg pressure for 40 minutes    Specimen: Degenerative bone and cartilage from first metatarsophalangeal joint left foot    Complications: None    Operative indications: Chronic deformity to the right foot secondary to tight Achilles tendon hallux malleus with subsequent chronic ulceration first metatarsal head. Patient also has diabetes type 2 with peripheral neuropathy complications    Procedure:  Patient was brought into the OR and placed on the operating table in thesupine position. After anesthesia was obtained theleft lower extremity was prepped and draped in usual sterile fashion.  Operative Report: At this time attention directed to the posterior Achilles tendon on the left ankle area. The tendon was palpated and marked at 1 cm proximal to the superior portion of the calcaneus on the medial side and 3 cm proximal that on the lateral side of the tendon and then another 3 cm proximal to that on the medial side again. The respective one third aspect of the tendon was palpated and a 15 blade was entered percutaneously and the medial third of the tendon was released distally then oral third of the tendon 3 cm proximal lateral. Then once again least at the medial third of the tendon proximally. This time the foot was dorsiflexed and the tendon was felt to slide. Still felt palpable after this slide occurred and felt like it gave good  lengthening to the tendon area. The incision margin is an copiously irrigated and closed with 5-0 nylon simple sutures.  At this time attention was directed to the first metatarsophalangeal joint where a 4 cemented dorsal or skin incision made just medial to the extensor tendon. Extensor tendon was then freed up capsule tissue was identified and incised longitudinally over the dorsum of the first metatarsal proximal phalanx this was then freed mediolaterally. The proximal phalanx base was then resected with power equipment then removed. At this point I was able to plantar flex the toe to align with the first metatarsal when it was not possible for. A 0.062 K wire was then run through the distal and proximal phalanges and into the first metatarsal head and shaft while holding the toe in a rectus alignment. There is wearing copiously irrigated. The extensor tendon was then lengthened with a slide lengthening technique and sutured with combination of 3-0 Ethibond and Ortho cord there is an copiously irrigated and capsular periosteal tissue was closed with 3-0 Vicryl in a simple interrupted stitch. Deep superficial fascial layers and also the covering of the extensor tendon was closed with 4-0 Vicryl in continuous stitch. Skin closed with 4 Vicryl subcuticular fashion. A sterile compressive dressings placed across wound consisting of Xeroform gauze 4 x 4's Kling Kerlix and Steri-Strips. Tourniquet was released and prompt prevascular seen to return all digits of the left foot. Patient. Tolerated procedure and anesthesia well left the OR to the recovery room vital signs stable and neurovascular status intact  Patient tolerated the procedure and anesthesia well.  Was transported from the OR to the PACU with all vital signs stable and vascular status intact. To be discharged per routine protocol.  Will follow up in approximately 1 week in the outpatient clinic.

## 2015-02-08 NOTE — Progress Notes (Signed)
Pt would prefer to go to [facility] if rehab is recommended.

## 2015-02-08 NOTE — Transfer of Care (Signed)
Immediate Anesthesia Transfer of Care Note  Patient: Sharon [name]  Procedure(s) Performed: Procedure(s) with comments: HALLUX VALGUS AKIN/ KELLER (Left) - IVA with Local needs 1 hour for this case  ACHILLES LENGTHENING/KIDNER (Left)  Patient Location: PACU  Anesthesia Type:General  Level of Consciousness: awake, alert  and oriented  Airway & Oxygen Therapy: spontaneouly breathing  Post-op Assessment: Report given to RN and Post -op Vital signs reviewed and stable  Post vital signs: Reviewed and stable  Last Vitals:  Filed Vitals:   02/08/15 0806 02/08/15 1001  BP: 95/53 115/61  Pulse: 59 58  Temp:  37.1 C  Resp: 18 16    Complications: No apparent anesthesia complications

## 2015-02-08 NOTE — H&P (Signed)
H and P has been reviewed and no changes are noted.  

## 2015-02-08 NOTE — Anesthesia Procedure Notes (Signed)
Procedure Name: LMA Insertion Date/Time: 02/08/2015 11:08 AM Performed by: Delaney Meigs Pre-anesthesia Checklist: Patient identified, Emergency Drugs available, Suction available, Patient being monitored and Timeout performed Patient Re-evaluated:Patient Re-evaluated prior to inductionOxygen Delivery Method: Circle system utilized and Simple face mask Preoxygenation: Pre-oxygenation with 100% oxygen Intubation Type: IV induction Ventilation: Mask ventilation without difficulty LMA Size: 3.5 Number of attempts: 1 Placement Confirmation: breath sounds checked- equal and bilateral and positive ETCO2 Tube secured with: Tape

## 2015-02-08 NOTE — Progress Notes (Signed)
[place] at [facility]                                                                                                                                                                                            Patient Demographics   Sharon [name], is a 82 y.o. female, DOB - February 06, 1933, QQI:[id number]  Admit date - 02/07/2015   Admitting Physician Dustin Flock, MD  Outpatient Primary MD for the patient is Rusty Aus., MD   LOS - 1  Subjective: Patient underwent Achilles tendon lengthening left foot Arthroplasty first metatarsal phalangeal joint left foot with proximal phalanx base resection. K wire fixation. Lengthening extensor hallucis longus tendon. She is otherwise denies chest pain or shortness of breath.     Review of Systems:   CONSTITUTIONAL: No documented fever. No fatigue, weakness. No weight gain, no weight loss.  EYES: No blurry or double vision.  ENT: No tinnitus. No postnasal drip. No redness of the oropharynx.  RESPIRATORY: No cough, no wheeze, no hemoptysis. No dyspnea.  CARDIOVASCULAR: No chest pain. No orthopnea. No palpitations. No syncope.  GASTROINTESTINAL: No nausea, no vomiting or diarrhea. No abdominal pain. No melena or hematochezia.  GENITOURINARY: No dysuria or hematuria.  ENDOCRINE: No polyuria or nocturia. No heat or cold intolerance.  HEMATOLOGY: No anemia. No bruising. No bleeding.  INTEGUMENTARY: No rashes. No lesions.  MUSCULOSKELETAL: No arthritis. No swelling. No gout.  NEUROLOGIC: No numbness, tingling, or ataxia. No seizure-type activity.  PSYCHIATRIC: No anxiety. No insomnia. No ADD.    Vitals:   Filed Vitals:   02/08/15 1303 02/08/15 1309 02/08/15 1322 02/08/15 1404  BP: 111/69 112/69 126/64 132/65  Pulse: 73 73 73 69  Temp:  98.2 F (36.8 C)  97.7 F (36.5 C)  TempSrc:    Oral  Resp: '17 20 16 18  '$ Height:      Weight:      SpO2: 94% 93% 95% 100%    Wt Readings from Last 3 Encounters:   02/08/15 58.514 kg (129 lb)  12/02/14 58.514 kg (129 lb)  11/05/14 66.543 kg (146 lb 11.2 oz)     Intake/Output Summary (Last 24 hours) at 02/08/15 1419 Last data filed at 02/08/15 1215  Gross per 24 hour  Intake   1040 ml  Output    875 ml  Net    165 ml    Physical Exam:   GENERAL: Pleasant-appearing in no apparent distress.  HEAD, EYES, EARS, NOSE AND THROAT: Atraumatic, normocephalic. Extraocular muscles are intact. Pupils equal and reactive to light. Sclerae anicteric. No conjunctival injection. No oro-pharyngeal erythema.  NECK: Supple.  There is no jugular venous distention. No bruits, no lymphadenopathy, no thyromegaly.  HEART: Regular rate and rhythm,. No murmurs, no rubs, no clicks.  LUNGS: Clear to auscultation bilaterally. No rales or rhonchi. No wheezes.  ABDOMEN: Soft, flat, nontender, nondistended. Has good bowel sounds. No hepatosplenomegaly appreciated.  EXTREMITIES: No evidence of any cyanosis, clubbing, or peripheral edema.  +2 pedal and radial pulses bilaterally.  NEUROLOGIC: The patient is alert, awake, and oriented x3 with no focal motor or sensory deficits appreciated bilaterally.  SKIN: Moist and warm with no rashes appreciated.  Psych: Not anxious, depressed LN: No inguinal LN enlargement    Antibiotics   Anti-infectives    None      Medications   Scheduled Meds: . amiodarone  200 mg Oral Daily  . bisacodyl  5 mg Oral Daily  . cholecalciferol  2,000 Units Oral Daily  . ferrous sulfate  325 mg Oral Q breakfast  . gabapentin  600 mg Oral BID  . glipiZIDE  2.5 mg Oral Q breakfast  . insulin aspart  0-9 Units Subcutaneous TID WC  . levothyroxine  112 mcg Oral QAC breakfast  . lisinopril  10 mg Oral Daily  . pantoprazole  40 mg Oral Daily  . simvastatin  40 mg Oral q1800  . temazepam  30 mg Oral QHS   Continuous Infusions: . sodium chloride 50 mL/hr at 02/08/15 1020   PRN Meds:.acetaminophen, morphine injection,  oxyCODONE-acetaminophen   Data Review:   Micro Results No results found for this or any previous visit (from the past 240 hour(s)).  Radiology Reports No results found.   CBC  Recent Labs Lab 02/07/15 1652 02/08/15 0518  WBC 7.8 6.3  HGB 12.1 11.1*  HCT 37.3 33.8*  PLT 231 217  MCV 84.1 84.9  MCH 27.3 27.9  MCHC 32.5 32.8  RDW 16.7* 16.5*    Chemistries   Recent Labs Lab 02/07/15 1652 02/08/15 0518  NA 139 140  K 4.3 4.2  CL 108 110  CO2 23 23  GLUCOSE 141* 134*  BUN 12 18  CREATININE 1.13* 1.21*  CALCIUM 8.9 8.4*   ------------------------------------------------------------------------------------------------------------------ estimated creatinine clearance is 33.9 mL/min (by C-G formula based on Cr of 1.21). ------------------------------------------------------------------------------------------------------------------ No results for input(s): HGBA1C in the last 72 hours. ------------------------------------------------------------------------------------------------------------------ No results for input(s): CHOL, HDL, LDLCALC, TRIG, CHOLHDL, LDLDIRECT in the last 72 hours. ------------------------------------------------------------------------------------------------------------------ No results for input(s): TSH, T4TOTAL, T3FREE, THYROIDAB in the last 72 hours.  Invalid input(s): FREET3 ------------------------------------------------------------------------------------------------------------------ No results for input(s): VITAMINB12, FOLATE, FERRITIN, TIBC, IRON, RETICCTPCT in the last 72 hours.  Coagulation profile  Recent Labs Lab 02/07/15 1652  INR 0.94    No results for input(s): DDIMER in the last 72 hours.  Cardiac Enzymes No results for input(s): CKMB, TROPONINI, MYOGLOBIN in the last 168 hours.  Invalid input(s):  CK ------------------------------------------------------------------------------------------------------------------ Invalid input(s): [place]   Patient is a 82 year old white female with foot ulcer and infection  1. Left foot diabetic ulcer with infection:  Continue broad-spectrum antibiotics with vancomycin and Zosyn  2. Chronic atrial fibrillation continue amiodarone resume aspirin  3. Diabetes type 2 continue glipizide and sliding scale   4. Hypertension continue lisinopril  5. Hypothyrddism continue Synthroid as taking at home  6. Miscellaneous SCDs for DVT prophylaxis for now in light of planned surgery tomorrow can do heparin or Lovenox postop  All the records are reviewed and case discussed with ED provider. Management plans discussed with the patient, family and  they are in agreement.     Code Status Orders        Start     Ordered   02/07/15 1606  Full code   Continuous     02/07/15 1605    Code Status History    Date Active Date Inactive Code Status Order ID Comments User Context   11/05/2014  3:51 PM 11/09/2014  3:36 PM Full Code [id number]  Gladstone Lighter, MD Inpatient   10/10/2014 11:12 AM 10/17/2014  5:50 PM Full Code [id number]  Epifanio Lesches, MD Inpatient    Advance Directive Documentation        Most Recent Value   Type of Advance Directive  Healthcare Power of Attorney, Living will   Pre-existing out of facility DNR order (yellow form or pink MOST form)     "MOST" Form in Place?             Consults  podiatry DVT Prophylaxis start Lovenox  Lab Results  Component Value Date   PLT 217 02/08/2015     Time Spent in minutes  35 minutes  Dustin Flock M.D on 02/08/2015 at 2:19 PM  Between 7am to 6pm - Pager - [phone]  After 6pm go to [email or web address] - password [email or web address] [facility]   Office  [phone]

## 2015-02-08 NOTE — Progress Notes (Signed)
ANTIBIOTIC CONSULT NOTE - INITIAL  Pharmacy Consult for Vancomycin and Zosyn Indication: Osteomyelitis  Allergies  Allergen Reactions  . Codeine Other (See Comments)    Other reaction(s): Other (See Comments) Patient states she did not feel herself when she took it. unknown  . Cymbalta [Duloxetine Hcl] Other (See Comments)    Confusion, disorientation  . Duloxetine     Other reaction(s): Other (See Comments) Confusion, disorientation  . Lyrica [Pregabalin] Other (See Comments)    Patient and son states this makes the patient confused.    Patient Measurements: Height: '5\' 5"'$  (165.1 cm) Weight: 129 lb (58.514 kg) IBW/kg (Calculated) : 57 Adjusted Body Weight: na  Vital Signs: Temp: 97.7 F (36.5 C) (01/26 1404) Temp Source: Oral (01/26 1404) BP: 132/65 mmHg (01/26 1404) Pulse Rate: 69 (01/26 1404) Intake/Output from previous day: 01/25 0701 - 01/26 0700 In: 240 [P.O.:240] Out: 875 [Urine:875] Intake/Output from this shift: Total I/O In: 800 [I.V.:800] Out: -   Labs:  Recent Labs  02/07/15 1652 02/08/15 0518  WBC 7.8 6.3  HGB 12.1 11.1*  PLT 231 217  CREATININE 1.13* 1.21*   Estimated Creatinine Clearance: 33.9 mL/min (by C-G formula based on Cr of 1.21). No results for input(s): VANCOTROUGH, VANCOPEAK, VANCORANDOM, GENTTROUGH, GENTPEAK, GENTRANDOM, TOBRATROUGH, TOBRAPEAK, TOBRARND, AMIKACINPEAK, AMIKACINTROU, AMIKACIN in the last 72 hours.   Microbiology: No results found for this or any previous visit (from the past 720 hour(s)).  Medical History: Past Medical History  Diagnosis Date  . CHF (congestive heart failure) ([place])   . Diabetes mellitus without complication ([place])   . Hyperlipidemia   . Atrial fibrillation ([place])   . Hypothyroid   . Hypertension   . Stroke ([place])   . Neuropathy ([place])   . Anemia   . Edema     feet/ankles occas  . Osteomyelitis ([place])     left first metatarsal  . Hip fracture ([place])   . Cardiac arrest [facility])     Medications:   Anti-infectives    Start     Dose/Rate Route Frequency Ordered Stop   02/09/15 0800  vancomycin (VANCOCIN) IVPB 750 mg/150 ml premix     750 mg 150 mL/hr over 60 Minutes Intravenous Every 24 hours 02/08/15 1450     02/08/15 1600  vancomycin (VANCOCIN) IVPB 1000 mg/200 mL premix     1,000 mg 200 mL/hr over 60 Minutes Intravenous  Once 02/08/15 1450     02/08/15 1500  piperacillin-tazobactam (ZOSYN) IVPB 3.375 g     3.375 g 12.5 mL/hr over 240 Minutes Intravenous 3 times per day 02/08/15 1441       Assessment: Patient is a 82yo female admitted for foot ulcer and osteomyelitis. Pharmacy consulted to dose Vancomycin and Zosyn.  Ke=0.033, t1/2=21hr, Vd=41 L  Goal of Therapy:  Vancomycin trough level 15-20 mcg/ml Resolution of infection  Plan:  Measure antibiotic drug levels at steady state Follow up culture results Will order Zosyn 3.375g IV q8h EI. Will order Vancomycin 1g IV once followed by '750mg'$  IV q24h in 16hr as stacked dose. Will check a trough level prior to 5th dose.  Paulina Fusi, PharmD, BCPS 02/08/2015 2:54 PM

## 2015-02-08 NOTE — Anesthesia Preprocedure Evaluation (Signed)
Anesthesia Evaluation  Patient identified by MRN, date of birth, ID band Patient awake    Reviewed: Allergy & Precautions, H&P , NPO status , Patient's Chart, lab work & pertinent test results, reviewed documented beta blocker date and time   History of Anesthesia Complications Negative for: history of anesthetic complications  Airway Mallampati: I  TM Distance: >3 FB Neck ROM: full    Dental no notable dental hx. (+) Caps, Teeth Intact   Pulmonary neg pulmonary ROS, former smoker,    Pulmonary exam normal breath sounds clear to auscultation       Cardiovascular Exercise Tolerance: Good hypertension, +CHF  (-) CAD, (-) Past MI, (-) Cardiac Stents and (-) CABG Normal cardiovascular exam+ dysrhythmias Atrial Fibrillation (-) Valvular Problems/Murmurs Rhythm:regular Rate:Normal     Neuro/Psych neg Seizures CVA, No Residual Symptoms negative psych ROS   GI/Hepatic Neg liver ROS, GERD  Medicated and Controlled,  Endo/Other  diabetesHypothyroidism   Renal/GU negative Renal ROS  negative genitourinary   Musculoskeletal   Abdominal   Peds  Hematology  (+) Blood dyscrasia, anemia ,   Anesthesia Other Findings Past Medical History:   CHF (congestive heart failure) (H[facility])                         Diabetes mellitus without complication (H[facility])                 Hyperlipidemia                                               Atrial fibrillation (HCC)                                    Hypothyroid                                                  Hypertension                                                 Stroke (HCC)                                                 Neuropathy (HCC)                                             Anemia                                                       Edema  Comment:feet/ankles occas   Osteomyelitis ([place])                                           Comment:left first metatarsal   Hip fracture ([place])                                           Cardiac arrest ([place])                                         Reproductive/Obstetrics negative OB ROS                             Anesthesia Physical Anesthesia Plan  ASA: III  Anesthesia Plan: General   Post-op Pain Management:    Induction:   Airway Management Planned:   Additional Equipment:   Intra-op Plan:   Post-operative Plan:   Informed Consent: I have reviewed the patients History and Physical, chart, labs and discussed the procedure including the risks, benefits and alternatives for the proposed anesthesia with the patient or authorized representative who has indicated his/her understanding and acceptance.   Dental Advisory Given  Plan Discussed with: Anesthesiologist, CRNA and Surgeon  Anesthesia Plan Comments:         Anesthesia Quick Evaluation

## 2015-02-09 ENCOUNTER — Encounter: Payer: Self-pay | Admitting: Podiatry

## 2015-02-09 DIAGNOSIS — I10 Essential (primary) hypertension: Secondary | ICD-10-CM | POA: Diagnosis not present

## 2015-02-09 DIAGNOSIS — I4891 Unspecified atrial fibrillation: Secondary | ICD-10-CM | POA: Diagnosis not present

## 2015-02-09 DIAGNOSIS — L089 Local infection of the skin and subcutaneous tissue, unspecified: Secondary | ICD-10-CM | POA: Diagnosis not present

## 2015-02-09 DIAGNOSIS — E118 Type 2 diabetes mellitus with unspecified complications: Secondary | ICD-10-CM | POA: Diagnosis not present

## 2015-02-09 LAB — GLUCOSE, CAPILLARY
GLUCOSE-CAPILLARY: 162 mg/dL — AB (ref 65–99)
GLUCOSE-CAPILLARY: 74 mg/dL (ref 65–99)
Glucose-Capillary: 128 mg/dL — ABNORMAL HIGH (ref 65–99)
Glucose-Capillary: 105 mg/dL — ABNORMAL HIGH (ref 65–99)

## 2015-02-09 MED ORDER — DOCUSATE SODIUM 100 MG PO CAPS
200.0000 mg | ORAL_CAPSULE | Freq: Two times a day (BID) | ORAL | Status: DC
  Administered 2015-02-09 – 2015-02-12 (×4): 200 mg via ORAL
  Filled 2015-02-09 (×5): qty 2

## 2015-02-09 NOTE — Progress Notes (Signed)
[place] at [facility]                                                                                                                                                                                            Patient Demographics   Sharon [name], is a 82 y.o. female, DOB - 06-04-1933, OXB:[id number]  Admit date - 02/07/2015   Admitting Physician Dustin Flock, MD  Outpatient Primary MD for the patient is Rusty Aus., MD   LOS - 2  Subjective: Feeling well and offers no complaints still at bedrest    Review of Systems:   CONSTITUTIONAL: No documented fever. No fatigue, weakness. No weight gain, no weight loss.  EYES: No blurry or double vision.  ENT: No tinnitus. No postnasal drip. No redness of the oropharynx.  RESPIRATORY: No cough, no wheeze, no hemoptysis. No dyspnea.  CARDIOVASCULAR: No chest pain. No orthopnea. No palpitations. No syncope.  GASTROINTESTINAL: No nausea, no vomiting or diarrhea. No abdominal pain. No melena or hematochezia.  GENITOURINARY: No dysuria or hematuria.  ENDOCRINE: No polyuria or nocturia. No heat or cold intolerance.  HEMATOLOGY: No anemia. No bruising. No bleeding.  INTEGUMENTARY: No rashes. No lesions.  MUSCULOSKELETAL: No arthritis. No swelling. No gout.  NEUROLOGIC: No numbness, tingling, or ataxia. No seizure-type activity.  PSYCHIATRIC: No anxiety. No insomnia. No ADD.    Vitals:   Filed Vitals:   02/08/15 1647 02/08/15 2017 02/09/15 0432 02/09/15 0718  BP: 131/57 1'02/63 93/59 96/63 '$  Pulse: 69 66 71 67  Temp: 98.3 F (36.8 C) 97.9 F (36.6 C) 97.7 F (36.5 C) 98.1 F (36.7 C)  TempSrc:  Oral Oral Oral  Resp: '16 17 18 16  '$ Height:      Weight:      SpO2: 100% 100% 97% 98%    Wt Readings from Last 3 Encounters:  02/08/15 58.514 kg (129 lb)  12/02/14 58.514 kg (129 lb)  11/05/14 66.543 kg (146 lb 11.2 oz)     Intake/Output Summary (Last 24 hours) at 02/09/15 1513 Last data filed at  02/09/15 1300  Gross per 24 hour  Intake 1973.34 ml  Output   1750 ml  Net 223.34 ml    Physical Exam:   GENERAL: Pleasant-appearing in no apparent distress.  HEAD, EYES, EARS, NOSE AND THROAT: Atraumatic, normocephalic. Extraocular muscles are intact. Pupils equal and reactive to light. Sclerae anicteric. No conjunctival injection. No oro-pharyngeal erythema.  NECK: Supple. There is no jugular venous distention. No bruits, no lymphadenopathy, no thyromegaly.  HEART: Regular rate and rhythm,. No murmurs, no rubs, no clicks.  LUNGS: Clear to auscultation  bilaterally. No rales or rhonchi. No wheezes.  ABDOMEN: Soft, flat, nontender, nondistended. Has good bowel sounds. No hepatosplenomegaly appreciated.  EXTREMITIES: No evidence of any cyanosis, clubbing, or peripheral edema.  +2 pedal and radial pulses bilaterally.  NEUROLOGIC: The patient is alert, awake, and oriented x3 with no focal motor or sensory deficits appreciated bilaterally.  SKIN: Moist and warm with no rashes appreciated.  Psych: Not anxious, depressed LN: No inguinal LN enlargement    Antibiotics   Anti-infectives    Start     Dose/Rate Route Frequency Ordered Stop   02/09/15 0800  vancomycin (VANCOCIN) IVPB 750 mg/150 ml premix     750 mg 150 mL/hr over 60 Minutes Intravenous Every 24 hours 02/08/15 1450     02/08/15 1600  vancomycin (VANCOCIN) IVPB 1000 mg/200 mL premix     1,000 mg 200 mL/hr over 60 Minutes Intravenous  Once 02/08/15 1450 02/08/15 1732   02/08/15 1500  piperacillin-tazobactam (ZOSYN) IVPB 3.375 g     3.375 g 12.5 mL/hr over 240 Minutes Intravenous 3 times per day 02/08/15 1441        Medications   Scheduled Meds: . amiodarone  200 mg Oral Daily  . bisacodyl  5 mg Oral Daily  . cholecalciferol  2,000 Units Oral Daily  . docusate sodium  200 mg Oral BID  . enoxaparin (LOVENOX) injection  40 mg Subcutaneous Q24H  . ferrous sulfate  325 mg Oral Q breakfast  . gabapentin  600 mg Oral BID  .  glipiZIDE  2.5 mg Oral Q breakfast  . insulin aspart  0-9 Units Subcutaneous TID WC  . levothyroxine  112 mcg Oral QAC breakfast  . lisinopril  10 mg Oral Daily  . pantoprazole  40 mg Oral Daily  . piperacillin-tazobactam (ZOSYN)  IV  3.375 g Intravenous 3 times per day  . simvastatin  40 mg Oral q1800  . temazepam  30 mg Oral QHS  . vancomycin  750 mg Intravenous Q24H   Continuous Infusions: . sodium chloride 50 mL/hr at 02/09/15 1410   PRN Meds:.acetaminophen, morphine injection, oxyCODONE-acetaminophen   Data Review:   Micro Results Recent Results (from the past 240 hour(s))  MRSA PCR Screening     Status: None   Collection Time: 02/08/15  4:40 PM  Result Value Ref Range Status   MRSA by PCR NEGATIVE NEGATIVE Final    Comment:        The GeneXpert MRSA Assay (FDA approved for NASAL specimens only), is one component of a comprehensive MRSA colonization surveillance program. It is not intended to diagnose MRSA infection nor to guide or monitor treatment for MRSA infections.     Radiology Reports No results found.   CBC  Recent Labs Lab 02/07/15 1652 02/08/15 0518  WBC 7.8 6.3  HGB 12.1 11.1*  HCT 37.3 33.8*  PLT 231 217  MCV 84.1 84.9  MCH 27.3 27.9  MCHC 32.5 32.8  RDW 16.7* 16.5*    Chemistries   Recent Labs Lab 02/07/15 1652 02/08/15 0518  NA 139 140  K 4.3 4.2  CL 108 110  CO2 23 23  GLUCOSE 141* 134*  BUN 12 18  CREATININE 1.13* 1.21*  CALCIUM 8.9 8.4*   ------------------------------------------------------------------------------------------------------------------ estimated creatinine clearance is 33.9 mL/[name] (by C-G formula based on Cr of 1.21). ------------------------------------------------------------------------------------------------------------------ No results for input(s): HGBA1C in the last 72 hours. ------------------------------------------------------------------------------------------------------------------ No  results for input(s): CHOL, HDL, LDLCALC, TRIG, CHOLHDL, LDLDIRECT in the last 72 hours. ------------------------------------------------------------------------------------------------------------------ No results for  input(s): TSH, T4TOTAL, T3FREE, THYROIDAB in the last 72 hours.  Invalid input(s): FREET3 ------------------------------------------------------------------------------------------------------------------ No results for input(s): VITAMINB12, FOLATE, FERRITIN, TIBC, IRON, RETICCTPCT in the last 72 hours.  Coagulation profile  Recent Labs Lab 02/07/15 1652  INR 0.94    No results for input(s): DDIMER in the last 72 hours.  Cardiac Enzymes No results for input(s): CKMB, TROPONINI, MYOGLOBIN in the last 168 hours.  Invalid input(s): CK ------------------------------------------------------------------------------------------------------------------ Invalid input(s): [place]   Patient is a 82 year old white female with foot ulcer and infection  1. Left foot diabetic ulcer with infection:  Continue broad-spectrum antibiotics with vancomycin and Zosyn Await cultures from surgery 2. Chronic atrial fibrillation continue amiodarone resume aspirin  3. Diabetes type 2 continue glipizide and sliding scale   4. Hypertension continue lisinopril  5. Hypothyrddism continue Synthroid as taking at home  6. Miscellaneous SCDs for DVT prophylaxis for now in light of planned surgery tomorrow can do heparin or Lovenox postop  All the records are reviewed and case discussed with ED provider. Management plans discussed with the patient, family and they are in agreement.     Code Status Orders        Start     Ordered   02/07/15 1606  Full code   Continuous     02/07/15 1605    Code Status History    Date Active Date Inactive Code Status Order ID Comments User Context   11/05/2014  3:51 PM 11/09/2014  3:36 PM Full Code [id number]  Gladstone Lighter,  MD Inpatient   10/10/2014 11:12 AM 10/17/2014  5:50 PM Full Code [id number]  Epifanio Lesches, MD Inpatient    Advance Directive Documentation        Most Recent Value   Type of Advance Directive  Healthcare Power of Attorney, Living will   Pre-existing out of facility DNR order (yellow form or pink MOST form)     "MOST" Form in Place?             Consults  podiatry DVT Prophylaxis start Lovenox  Lab Results  Component Value Date   PLT 217 02/08/2015     Time Spent in minutes  84mnutes  PDustin FlockM.D on 02/09/2015 at 3:13 PM  Between 7am to 6pm - Pager - [phone]  After 6pm go to [email or web address] - password [email or web address]   Office  [phone]

## 2015-02-09 NOTE — Clinical Social Work Note (Signed)
Clinical Social Work Assessment  Patient Details  Name: Sharon [name] MRN: [id number] Date of Birth: September 11, 1933  Date of referral:  02/09/15               Reason for consult:  Facility Placement                Permission sought to share information with:  Chartered certified accountant granted to share information::  Yes, Verbal Permission Granted  Name::      IT sales professional::   [place]   Relationship::     Contact Information:     Housing/Transportation Living arrangements for the past 2 months:  [place] of Information:  Patient Patient Interpreter Needed:  None Criminal Activity/Legal Involvement Pertinent to Current Situation/Hospitalization:  No - Comment as needed Significant Relationships:  Adult Children Lives with:  Self Do you feel safe going back to the place where you live?  Yes Need for family participation in patient care:  Yes (Comment)  Care giving concerns: Patient lives alone in [place].    Social Worker assessment / plan: Holiday representative (CSW) received SNF consult. Per MD patient is on bed rest today and PT will start tomorrow (Saturday 02/10/15). CSW met with patient to discuss D/C plan. Patient was alert and oriented and sitting up in the bed. CSW introduced self and explained role of CSW department. Per patient she lives alone in [place]. Patient has 2 twin sons Sharon [name] and Sharon [name]. Patient requested to go to [facility]. CSW explained SNF process and that patient's insurance Health Team has to approve SNF stay. Patient verbalized her understanding.  CSW presented bed offers. Patient chose [facility] Plan is for patient to D/C Monday to [facility] pending medical clearance. Amy Health Team case manager is aware of above. Kim admissions coordinator at [facility] is aware of accepted bed offer. CSW will continue to follow and assist as needed.     Employment status:  Disabled (Comment on  whether or not currently receiving Disability), Retired Nurse, adult PT Recommendations:  Not assessed at this time Information / Referral to community resources:  [place]  Patient/Family's Response to care:  Patient prefers to go to [facility]   Patient/Family's Understanding of and Emotional Response to Diagnosis, Current Treatment, and Prognosis:  Patient was pleasant throughout assessment and thanked CSW for visit.   Emotional Assessment Appearance:  Appears stated age Attitude/Demeanor/Rapport:    Affect (typically observed):  Accepting, Adaptable, Pleasant Orientation:  Oriented to Self, Oriented to Place, Oriented to  Time, Oriented to Situation Alcohol / Substance use:  Not Applicable Psych involvement (Current and /or in the community):  No (Comment)  Discharge Needs  Concerns to be addressed:  Discharge Planning Concerns Readmission within the last 30 days:  No Current discharge risk:  Dependent with Mobility Barriers to Discharge:  Continued Medical Work up   Loralyn Freshwater, LCSW 02/09/2015, 4:09 PM

## 2015-02-09 NOTE — Clinical Social Work Placement (Signed)
   CLINICAL SOCIAL WORK PLACEMENT  NOTE  Date:  02/09/2015  Patient Details  Name: Lonisha Bobby Steffek MRN: [id number] Date of Birth: 26-May-1933  Clinical Social Work is seeking post-discharge placement for this patient at the [place] level of care (*CSW will initial, date and re-position this form in  chart as items are completed):  Yes   Patient/family provided with [place] Work Department's list of facilities offering this level of care within the geographic area requested by the patient (or if unable, by the patient's family).  Yes   Patient/family informed of their freedom to choose among providers that offer the needed level of care, that participate in Medicare, Medicaid or managed care program needed by the patient, have an available bed and are willing to accept the patient.  Yes   Patient/family informed of 's ownership interest in [facility] and [facility], as well as of the fact that they are under no obligation to receive care at these facilities.  PASRR submitted to EDS on       PASRR number received on       Existing PASRR number confirmed on 02/09/15     FL2 transmitted to all facilities in geographic area requested by pt/family on 02/09/15     FL2 transmitted to all facilities within larger geographic area on       Patient informed that his/her managed care company has contracts with or will negotiate with certain facilities, including the following:        Yes   Patient/family informed of bed offers received.  Patient chooses bed at  [facility] )     Physician recommends and patient chooses bed at      Patient to be transferred to   on  .  Patient to be transferred to facility by       Patient family notified on   of transfer.  Name of family member notified:        PHYSICIAN       Additional Comment:    _______________________________________________ Loralyn Freshwater, LCSW 02/09/2015, 4:07  PM

## 2015-02-09 NOTE — Care Management Important Message (Signed)
Important Message  Patient Details  Name: Sharon [name] MRN: [id number] Date of Birth: 01-05-1934   Medicare Important Message Given:  Yes    Marshell Garfinkel, RN 02/09/2015, 9:29 AM

## 2015-02-09 NOTE — NC FL2 (Signed)
[place] LEVEL OF CARE SCREENING TOOL     IDENTIFICATION  Patient Name: Sharon [name] Birthdate: 1933/04/30 Sex: female Admission Date (Current Location): 02/07/2015  [place] and [place] Number:  Engineering geologist and Address:  [facility], [address] [place], [place] [address]      Provider Number: [id number]  Attending Physician Name and Address:  Dustin Flock, MD  Relative Name and Phone Number:       Current Level of Care: Hospital Recommended Level of Care: [place] Prior Approval Number:    Date Approved/Denied:   PASRR Number:  [id number])  Discharge Plan: SNF    Current Diagnoses: Patient Active Problem List   Diagnosis Date Noted  . Foot ulcer ([place]) 02/07/2015  . ARF (acute renal failure) ([place]) 11/05/2014  . Diabetic foot infection ([place]) 10/10/2014  . Chronic diastolic heart failure ([place]) 05/31/2014  . HTN (hypertension) 05/31/2014  . DM (diabetes mellitus) type 2, uncontrolled, with ketoacidosis ([place]) 05/31/2014    Orientation RESPIRATION BLADDER Height & Weight    Self, Time, Situation, Place  Normal Incontinent '5\' 5"'$  (165.1 cm) 129 lbs.  BEHAVIORAL SYMPTOMS/MOOD NEUROLOGICAL BOWEL NUTRITION STATUS      Continent Diet (carb modified)  AMBULATORY STATUS COMMUNICATION OF NEEDS Skin   Extensive Assist Verbally Surgical wounds                       Personal Care Assistance Level of Assistance  Bathing, Feeding, Dressing Bathing Assistance: Maximum assistance Feeding assistance: Independent Dressing Assistance: Maximum assistance     Functional Limitations Info  Sight, Hearing, Speech Sight Info: Adequate Hearing Info: Adequate Speech Info: Adequate    SPECIAL CARE FACTORS FREQUENCY  PT (By licensed PT)                    Contractures      Additional Factors Info  Allergies, Isolation Precautions   Allergies Info:  (Codeine, Cymbalta, Duloxetine, Lyrica)      Isolation Precautions Info:  (10/09/14 MRSA in foot wound)     Current Medications (02/09/2015):  This is the current hospital active medication list Current Facility-Administered Medications  Medication Dose Route Frequency Provider Last Rate Last Dose  . 0.9 %  sodium chloride infusion   Intravenous Continuous Martha Clan, MD 50 mL/hr at 02/08/15 1020    . acetaminophen (TYLENOL) tablet 500 mg  500 mg Oral Q4H PRN Dustin Flock, MD      . amiodarone (PACERONE) tablet 200 mg  200 mg Oral Daily Dustin Flock, MD   200 mg at 02/09/15 0817  . bisacodyl (DULCOLAX) EC tablet 5 mg  5 mg Oral Daily Dustin Flock, MD   5 mg at 02/09/15 0817  . cholecalciferol (VITAMIN D) tablet 2,000 Units  2,000 Units Oral Daily Dustin Flock, MD   2,000 Units at 02/09/15 0816  . enoxaparin (LOVENOX) injection 40 mg  40 mg Subcutaneous Q24H Dustin Flock, MD   40 mg at 02/08/15 1643  . ferrous sulfate tablet 325 mg  325 mg Oral Q breakfast Dustin Flock, MD   325 mg at 02/09/15 0816  . gabapentin (NEURONTIN) tablet 600 mg  600 mg Oral BID Dustin Flock, MD   600 mg at 02/09/15 0816  . glipiZIDE (GLUCOTROL XL) 24 hr tablet 2.5 mg  2.5 mg Oral Q breakfast Dustin Flock, MD   2.5 mg at 02/09/15 0817  . insulin aspart (novoLOG) injection 0-9 Units  0-9  Units Subcutaneous TID WC Dustin Flock, MD   1 Units at 02/09/15 0816  . levothyroxine (SYNTHROID, LEVOTHROID) tablet 112 mcg  112 mcg Oral QAC breakfast Dustin Flock, MD   112 mcg at 02/09/15 0600  . lisinopril (PRINIVIL,ZESTRIL) tablet 10 mg  10 mg Oral Daily Dustin Flock, MD   10 mg at 02/09/15 0816  . morphine 2 MG/ML injection 1 mg  1 mg Intravenous Q4H PRN Dustin Flock, MD   1 mg at 02/08/15 2303  . oxyCODONE-acetaminophen (PERCOCET/ROXICET) 5-325 MG per tablet 1 tablet  1 tablet Oral Q4H PRN Dustin Flock, MD   1 tablet at 02/09/15 0601  . pantoprazole (PROTONIX) EC tablet 40 mg  40 mg Oral Daily Dustin Flock, MD   40 mg at 02/09/15 0817  .  piperacillin-tazobactam (ZOSYN) IVPB 3.375 g  3.375 g Intravenous 3 times per day Dustin Flock, MD   3.375 g at 02/09/15 0600  . simvastatin (ZOCOR) tablet 40 mg  40 mg Oral q1800 Dustin Flock, MD   40 mg at 02/08/15 1632  . temazepam (RESTORIL) capsule 30 mg  30 mg Oral QHS Dustin Flock, MD   30 mg at 02/08/15 2124  . vancomycin (VANCOCIN) IVPB 750 mg/150 ml premix  750 mg Intravenous Q24H Dustin Flock, MD         Discharge Medications: Please see discharge summary for a list of discharge medications.  Relevant Imaging Results:  Relevant Lab Results:   Additional Information  (SS: [id number])  Maurine Cane, LCSW

## 2015-02-09 NOTE — Progress Notes (Signed)
Patient Demographics  Sharon [name], is a 82 y.o. female   MRN: [id number]   DOB - [birth year]  Admit Date - 02/07/2015    Outpatient Primary MD for the patient is Rusty Aus., MD  Consult requested in the Hospital by Dustin Flock, MD, On 02/09/2015       With History of -  Past Medical History  Diagnosis Date  . CHF (congestive heart failure) ([place])   . Diabetes mellitus without complication ([place])   . Hyperlipidemia   . Atrial fibrillation ([place])   . Hypothyroid   . Hypertension   . Stroke ([place])   . Neuropathy ([place])   . Anemia   . Edema     feet/ankles occas  . Osteomyelitis ([place])     left first metatarsal  . Hip fracture ([place])   . Cardiac arrest [facility])       Past Surgical History  Procedure Laterality Date  . Hemiarthroplasty hip Right   . Hemiarthroplasty hip Left   . Cholecystectomy    . Appendectomy    . Cardiac catheterization  08/25/13  . Coronary angioplasty    . Cataract extraction w/phaco Left 07/20/2014    Procedure: CATARACT EXTRACTION PHACO AND INTRAOCULAR LENS PLACEMENT (IOC);  Surgeon: Leandrew Koyanagi, MD;  Location: [facility] ORS;  Service: Ophthalmology;  Laterality: Left;  [place]  1:18                 AP     23.6             CDE   9.69      lot #[id number]  . Incision and drainage Left 10/11/2014    Procedure: Removal of infected tibial sessmoid;  Surgeon: Albertine Patricia, DPM;  Location: [facility] ORS;  Service: Podiatry;  Laterality: Left;    in for   No chief complaint on file.    HPI  Sharon [name]  is a 82 y.o. female, one day postop following arthroplasty of the left first metatarsophalangeal joint and Achilles tendon lengthening. She is on bedrest right now. States her foot was very painful last night but she is better today. Splint is intact but she states that she reached  down and pulled some of the bandage off the toe because it was bothering her. Currently the pin Was exposed. Does not look like she uncovered the surgical site. As noted splint is intact at this point.  Social History Social History  Substance Use Topics  . Smoking status: Former Smoker -- 0.50 packs/day for 7 years    Types: Cigarettes    Quit date: 05/30/1989  . Smokeless tobacco: Never Used  . Alcohol Use: No     Family History Family History  Problem Relation Age of Onset  . Heart attack Mother      Prior to Admission medications   Medication Sig Start Date End Date Taking? Authorizing Provider  bisacodyl (DULCOLAX) 5 MG EC tablet Take 1 tablet (5 mg total) by mouth daily. 11/08/14  Yes Rusty Aus, MD  capsaicin (ZOSTRIX) 0.025 % cream Apply topically 2 (two) times daily. 11/08/14  Yes Rusty Aus, MD  cholecalciferol 2000 UNITS TABS Take 1 tablet (2,000 Units  total) by mouth daily. 11/08/14  Yes Rusty Aus, MD  ferrous sulfate 325 (65 FE) MG tablet Take 325 mg by mouth daily with breakfast.   Yes Historical Provider, MD  gabapentin (NEURONTIN) 600 MG tablet Take one tablet by mouth twice daily for neuropathy   Yes Historical Provider, MD  levothyroxine (SYNTHROID, LEVOTHROID) 112 MCG tablet Take one tablet by mouth once daily 30 minutes before breakfast once daily for hypothyroid   Yes Historical Provider, MD  lisinopril (PRINIVIL,ZESTRIL) 10 MG tablet Take 10 mg by mouth daily.   Yes Historical Provider, MD  simvastatin (ZOCOR) 40 MG tablet Take one tablet by mouth once daily at bedtime for cholesterol   Yes Historical Provider, MD  temazepam (RESTORIL) 30 MG capsule Take 1 capsule (30 mg total) by mouth at bedtime. 11/08/14  Yes Rusty Aus, MD  acetaminophen (TYLENOL) 500 MG tablet Take 500 mg by mouth every 4 (four) hours as needed for mild pain or moderate pain.    Historical Provider, MD  amiodarone (PACERONE) 200 MG tablet Take one tablet by mouth once daily for  AFIB    Historical Provider, MD  aspirin 81 MG tablet Take 81 mg by mouth daily.    Historical Provider, MD  doxycycline (VIBRA-TABS) 100 MG tablet Take 1 tablet (100 mg total) by mouth 2 (two) times daily. Patient not taking: Reported on 02/07/2015 11/08/14   Rusty Aus, MD  GLIPIZIDE XL 2.5 MG 24 hr tablet Take 2.5 mg by mouth daily. Reported on 02/07/2015 10/14/14   Historical Provider, MD  metroNIDAZOLE (FLAGYL) 500 MG tablet Take 1 tablet (500 mg total) by mouth every 8 (eight) hours. Patient not taking: Reported on 02/07/2015 11/08/14   Rusty Aus, MD  Multiple Vitamins-Minerals (PRESERVISION AREDS 2 PO) Take 2 tablets by mouth daily. Reported on 02/07/2015    Historical Provider, MD  pantoprazole (PROTONIX) 40 MG tablet Take 1 tablet (40 mg total) by mouth daily. Patient not taking: Reported on 02/07/2015 11/08/14   Rusty Aus, MD    Anti-infectives    Start     Dose/Rate Route Frequency Ordered Stop   02/09/15 0800  vancomycin (VANCOCIN) IVPB 750 mg/150 ml premix     750 mg 150 mL/hr over 60 Minutes Intravenous Every 24 hours 02/08/15 1450     02/08/15 1600  vancomycin (VANCOCIN) IVPB 1000 mg/200 mL premix     1,000 mg 200 mL/hr over 60 Minutes Intravenous  Once 02/08/15 1450 02/08/15 1732   02/08/15 1500  piperacillin-tazobactam (ZOSYN) IVPB 3.375 g     3.375 g 12.5 mL/hr over 240 Minutes Intravenous 3 times per day 02/08/15 1441        Scheduled Meds: . amiodarone  200 mg Oral Daily  . bisacodyl  5 mg Oral Daily  . cholecalciferol  2,000 Units Oral Daily  . enoxaparin (LOVENOX) injection  40 mg Subcutaneous Q24H  . ferrous sulfate  325 mg Oral Q breakfast  . gabapentin  600 mg Oral BID  . glipiZIDE  2.5 mg Oral Q breakfast  . insulin aspart  0-9 Units Subcutaneous TID WC  . levothyroxine  112 mcg Oral QAC breakfast  . lisinopril  10 mg Oral Daily  . pantoprazole  40 mg Oral Daily  . piperacillin-tazobactam (ZOSYN)  IV  3.375 g Intravenous 3 times per day  .  simvastatin  40 mg Oral q1800  . temazepam  30 mg Oral QHS  . vancomycin  750 mg Intravenous Q24H  Continuous Infusions: . sodium chloride 50 mL/hr at 02/08/15 1020   PRN Meds:.acetaminophen, morphine injection, oxyCODONE-acetaminophen  Allergies  Allergen Reactions  . Codeine Other (See Comments)    Other reaction(s): Other (See Comments) Patient states she did not feel herself when she took it. unknown  . Cymbalta [Duloxetine Hcl] Other (See Comments)    Confusion, disorientation  . Duloxetine     Other reaction(s): Other (See Comments) Confusion, disorientation  . Lyrica [Pregabalin] Other (See Comments)    Patient and son states this makes the patient confused.    Physical Exam  Vitals  Blood pressure 96/63, pulse 67, temperature 98.1 F (36.7 C), temperature source Oral, resp. rate 16, height '5\' 5"'$  (1.651 m), weight 58.514 kg (129 lb), SpO2 98 %.  Lower Extremity exam: Dressing and splint are grossly intact and in good position. Appears to be stable this juncture.   Data Review  CBC  Recent Labs Lab 02/07/15 1652 02/08/15 0518  WBC 7.8 6.3  HGB 12.1 11.1*  HCT 37.3 33.8*  PLT 231 217  MCV 84.1 84.9  MCH 27.3 27.9  MCHC 32.5 32.8  RDW 16.7* 16.5*   ------------------------------------------------------------------------------------------------------------------  Chemistries   Recent Labs Lab 02/07/15 1652 02/08/15 0518  NA 139 140  K 4.3 4.2  CL 108 110  CO2 23 23  GLUCOSE 141* 134*  BUN 12 18  CREATININE 1.13* 1.21*  CALCIUM 8.9 8.4*   ------------------------------------------------------------------------------------------------------------------ estimated creatinine clearance is 33.9 mL/min (by C-G formula based on Cr of 1.21). ------------------------------------------------------------------------------------------------------------------ No results for input(s): TSH, T4TOTAL, T3FREE, THYROIDAB in the last 72 hours.  Invalid  input(s): FREET3   Coagulation profile  Recent Labs Lab 02/07/15 1652  INR 0.94   ---------------------------------------------------------------------------------------------------------------  Urinalysis    Component Value Date/Time   COLORURINE YELLOW* 11/05/2014 1142   COLORURINE Yellow 08/18/2013 0253   APPEARANCEUR CLEAR* 11/05/2014 1142   APPEARANCEUR Hazy 08/18/2013 0253   LABSPEC 1.013 11/05/2014 1142   LABSPEC 1.012 08/18/2013 0253   PHURINE 5.0 11/05/2014 1142   PHURINE 5.0 08/18/2013 0253   GLUCOSEU NEGATIVE 11/05/2014 1142   GLUCOSEU >=500 08/18/2013 0253   HGBUR NEGATIVE 11/05/2014 1142   HGBUR Negative 08/18/2013 [place] 11/05/2014 1142   BILIRUBINUR Negative 08/18/2013 0253   KETONESUR NEGATIVE 11/05/2014 1142   KETONESUR Negative 08/18/2013 0253   PROTEINUR NEGATIVE 11/05/2014 1142   PROTEINUR 100 mg/dL 08/18/2013 0253   NITRITE NEGATIVE 11/05/2014 1142   NITRITE Negative 08/18/2013 0253   LEUKOCYTESUR TRACE* 11/05/2014 1142   LEUKOCYTESUR Trace 08/18/2013 0253      Assessment & Plan: Overall doing patient stable to this point. Vital signs are stable she's afebrile. Pain is under better control today. Plan: Retape the pin cover. Leave splint dressing intact today. Keep her at bed rest today and will start transferring tomorrow.  Active Problems:   Foot ulcer (HCC)    Perry Mount M.D on 02/09/2015 at 9:09 AM  Thank you for the consult, we will follow the patient with you in the Hospital.

## 2015-02-09 NOTE — Care Management (Signed)
Patient now request SNF. PT evaluation pending. [facility] following for IV ABX- however patient would like [facility] for HHRN/PT if they have availability. RNCM will continue to follow.

## 2015-02-09 NOTE — Progress Notes (Signed)
Patient states that she would like to take Restoril before bed will notify nursing staff

## 2015-02-09 NOTE — Progress Notes (Signed)
Pt reports pain well controlled this shift. Appetite good. Pt stated she had itching and pain to left foot overnight, used a pencil to loosen and remove bandage surrounding toes. Dressing otherwise intact. MD made aware, dressing reinforced.

## 2015-02-10 DIAGNOSIS — I4891 Unspecified atrial fibrillation: Secondary | ICD-10-CM | POA: Diagnosis not present

## 2015-02-10 DIAGNOSIS — I10 Essential (primary) hypertension: Secondary | ICD-10-CM | POA: Diagnosis not present

## 2015-02-10 DIAGNOSIS — E118 Type 2 diabetes mellitus with unspecified complications: Secondary | ICD-10-CM | POA: Diagnosis not present

## 2015-02-10 DIAGNOSIS — L089 Local infection of the skin and subcutaneous tissue, unspecified: Secondary | ICD-10-CM | POA: Diagnosis not present

## 2015-02-10 LAB — GLUCOSE, CAPILLARY
GLUCOSE-CAPILLARY: 120 mg/dL — AB (ref 65–99)
GLUCOSE-CAPILLARY: 120 mg/dL — AB (ref 65–99)
Glucose-Capillary: 117 mg/dL — ABNORMAL HIGH (ref 65–99)
Glucose-Capillary: 142 mg/dL — ABNORMAL HIGH (ref 65–99)

## 2015-02-10 LAB — CREATININE, SERUM
Creatinine, Ser: 0.89 mg/dL (ref 0.44–1.00)
GFR calc Af Amer: >60 mL/min (ref >60)
GFR calc non Af Amer: >60 mL/min (ref >60)

## 2015-02-10 MED ORDER — ASPIRIN 81 MG PO CHEW
81.0000 mg | CHEWABLE_TABLET | Freq: Every day | ORAL | Status: DC
  Administered 2015-02-10 – 2015-02-12 (×3): 81 mg via ORAL
  Filled 2015-02-10 (×3): qty 1

## 2015-02-10 MED ORDER — LACTULOSE 10 GM/15ML PO SOLN: 30.0000 g | Freq: Two times a day (BID) | ORAL | Status: DC | PRN

## 2015-02-10 MED ORDER — BISACODYL 10 MG RE SUPP
10.0000 mg | Freq: Once | RECTAL | Status: DC
  Filled 2015-02-10: qty 1

## 2015-02-10 MED ORDER — POLYETHYLENE GLYCOL 3350 17 G PO PACK
17.0000 g | PACK | Freq: Every day | ORAL | Status: DC
  Administered 2015-02-12: 17 g via ORAL
  Filled 2015-02-10 (×2): qty 1

## 2015-02-10 NOTE — Progress Notes (Signed)
[place] at [facility]                                                                                                                                                                                            Patient Demographics   Jeania Nater, is a 82 y.o. female, DOB - 02-Jul-1933, MOQ:[id number]  Admit date - 02/07/2015   Admitting Physician Dustin Flock, MD  Outpatient Primary MD for the patient is Rusty Aus., MD   LOS - 3  Subjective: Patient currently sitting in the chair. Has not had a bowel movement according to the nurse. Blood pressure is running a little low   Review of Systems:   CONSTITUTIONAL: No documented fever. No fatigue, weakness. No weight gain, no weight loss.  EYES: No blurry or double vision.  ENT: No tinnitus. No postnasal drip. No redness of the oropharynx.  RESPIRATORY: No cough, no wheeze, no hemoptysis. No dyspnea.  CARDIOVASCULAR: No chest pain. No orthopnea. No palpitations. No syncope.  GASTROINTESTINAL: No nausea, no vomiting or diarrhea. No abdominal pain. No melena or hematochezia. Positive constipation GENITOURINARY: No dysuria or hematuria.  ENDOCRINE: No polyuria or nocturia. No heat or cold intolerance.  HEMATOLOGY: No anemia. No bruising. No bleeding.  INTEGUMENTARY: No rashes. No lesions.  MUSCULOSKELETAL: No arthritis. No swelling. No gout.  NEUROLOGIC: No numbness, tingling, or ataxia. No seizure-type activity.  PSYCHIATRIC: No anxiety. No insomnia. No ADD.    Vitals:   Filed Vitals:   02/09/15 1530 02/09/15 1925 02/10/15 0500 02/10/15 0729  BP: '99/62 97/65 99/67 '$ 99/57  Pulse: 69 70 72 61  Temp: 98.1 F (36.7 C) 98.1 F (36.7 C) 98.6 F (37 C) 98.6 F (37 C)  TempSrc: Oral Oral Oral Oral  Resp: '18 18 18 16  '$ Height:      Weight:      SpO2: 100% 95% 97% 93%    Wt Readings from Last 3 Encounters:  02/08/15 58.514 kg (129 lb)  12/02/14 58.514 kg (129 lb)  11/05/14 66.543 kg (146 lb  11.2 oz)     Intake/Output Summary (Last 24 hours) at 02/10/15 1221 Last data filed at 02/10/15 1157  Gross per 24 hour  Intake    770 ml  Output   3025 ml  Net  -2255 ml    Physical Exam:   GENERAL: Pleasant-appearing in no apparent distress.  HEAD, EYES, EARS, NOSE AND THROAT: Atraumatic, normocephalic. Extraocular muscles are intact. Pupils equal and reactive to light. Sclerae anicteric. No conjunctival injection. No oro-pharyngeal erythema.  NECK: Supple. There is no jugular venous distention. No bruits, no lymphadenopathy, no  thyromegaly.  HEART: Regular rate and rhythm,. No murmurs, no rubs, no clicks.  LUNGS: Clear to auscultation bilaterally. No rales or rhonchi. No wheezes.  ABDOMEN: Soft, flat, nontender, nondistended. Has good bowel sounds. No hepatosplenomegaly appreciated.  EXTREMITIES: No evidence of any cyanosis, clubbing, or peripheral edema.  +2 pedal and radial pulses bilaterally. His dressing in the foot NEUROLOGIC: The patient is alert, awake, and oriented x3 with no focal motor or sensory deficits appreciated bilaterally.  SKIN: Moist and warm with no rashes appreciated.  Psych: Not anxious, depressed LN: No inguinal LN enlargement    Antibiotics   Anti-infectives    Start     Dose/Rate Route Frequency Ordered Stop   02/09/15 0800  vancomycin (VANCOCIN) IVPB 750 mg/150 ml premix     750 mg 150 mL/hr over 60 Minutes Intravenous Every 24 hours 02/08/15 1450     02/08/15 1600  vancomycin (VANCOCIN) IVPB 1000 mg/200 mL premix     1,000 mg 200 mL/hr over 60 Minutes Intravenous  Once 02/08/15 1450 02/08/15 1732   02/08/15 1500  piperacillin-tazobactam (ZOSYN) IVPB 3.375 g     3.375 g 12.5 mL/hr over 240 Minutes Intravenous 3 times per day 02/08/15 1441        Medications   Scheduled Meds: . amiodarone  200 mg Oral Daily  . bisacodyl  5 mg Oral Daily  . bisacodyl  10 mg Rectal Once  . cholecalciferol  2,000 Units Oral Daily  . docusate sodium  200 mg  Oral BID  . enoxaparin (LOVENOX) injection  40 mg Subcutaneous Q24H  . ferrous sulfate  325 mg Oral Q breakfast  . gabapentin  600 mg Oral BID  . glipiZIDE  2.5 mg Oral Q breakfast  . insulin aspart  0-9 Units Subcutaneous TID WC  . levothyroxine  112 mcg Oral QAC breakfast  . pantoprazole  40 mg Oral Daily  . piperacillin-tazobactam (ZOSYN)  IV  3.375 g Intravenous 3 times per day  . polyethylene glycol  17 g Oral Daily  . simvastatin  40 mg Oral q1800  . temazepam  30 mg Oral QHS  . vancomycin  750 mg Intravenous Q24H   Continuous Infusions: . sodium chloride 50 mL/hr at 02/09/15 1410   PRN Meds:.acetaminophen, lactulose, morphine injection, oxyCODONE-acetaminophen   Data Review:   Micro Results Recent Results (from the past 240 hour(s))  MRSA PCR Screening     Status: None   Collection Time: 02/08/15  4:40 PM  Result Value Ref Range Status   MRSA by PCR NEGATIVE NEGATIVE Final    Comment:        The GeneXpert MRSA Assay (FDA approved for NASAL specimens only), is one component of a comprehensive MRSA colonization surveillance program. It is not intended to diagnose MRSA infection nor to guide or monitor treatment for MRSA infections.     Radiology Reports No results found.   CBC  Recent Labs Lab 02/07/15 1652 02/08/15 0518  WBC 7.8 6.3  HGB 12.1 11.1*  HCT 37.3 33.8*  PLT 231 217  MCV 84.1 84.9  MCH 27.3 27.9  MCHC 32.5 32.8  RDW 16.7* 16.5*    Chemistries   Recent Labs Lab 02/07/15 1652 02/08/15 0518 02/10/15 0409  NA 139 140  --   K 4.3 4.2  --   CL 108 110  --   CO2 23 23  --   GLUCOSE 141* 134*  --   BUN 12 18  --   CREATININE 1.13* 1.21* 0.89  CALCIUM 8.9 8.4*  --    ------------------------------------------------------------------------------------------------------------------ estimated creatinine clearance is 46.1 mL/min (by C-G formula based on Cr of  0.89). ------------------------------------------------------------------------------------------------------------------ No results for input(s): HGBA1C in the last 72 hours. ------------------------------------------------------------------------------------------------------------------ No results for input(s): CHOL, HDL, LDLCALC, TRIG, CHOLHDL, LDLDIRECT in the last 72 hours. ------------------------------------------------------------------------------------------------------------------ No results for input(s): TSH, T4TOTAL, T3FREE, THYROIDAB in the last 72 hours.  Invalid input(s): FREET3 ------------------------------------------------------------------------------------------------------------------ No results for input(s): VITAMINB12, FOLATE, FERRITIN, TIBC, IRON, RETICCTPCT in the last 72 hours.  Coagulation profile  Recent Labs Lab 02/07/15 1652  INR 0.94    No results for input(s): DDIMER in the last 72 hours.  Cardiac Enzymes No results for input(s): CKMB, TROPONINI, MYOGLOBIN in the last 168 hours.  Invalid input(s): CK ------------------------------------------------------------------------------------------------------------------ Invalid input(s): [place]   Patient is a 82 year old white female with foot ulcer and infection  1. Left foot diabetic ulcer with infection:  Continue broad-spectrum antibiotics with vancomycin and Zosyn Await cultures from surgery will change to oral antibiotics tomorrow,   2. Chronic atrial fibrillation continue amiodarone resume aspirin  3. Diabetes type 2 continue glipizide and sliding scale   4. Hypertensioblood pressure log lisinopril   5. Hypothyrddism continue Synthroid as taking at home  6. Miscellaneous SCDs for DVT prophylaxis  Lovenox  All the records are reviewed and case discussed with ED provider. Management plans discussed with the patient, family and they are in agreement.     Code  Status Orders        Start     Ordered   02/07/15 1606  Full code   Continuous     02/07/15 1605    Code Status History    Date Active Date Inactive Code Status Order ID Comments User Context   11/05/2014  3:51 PM 11/09/2014  3:36 PM Full Code [id number]  Gladstone Lighter, MD Inpatient   10/10/2014 11:12 AM 10/17/2014  5:50 PM Full Code [id number]  Epifanio Lesches, MD Inpatient    Advance Directive Documentation        Most Recent Value   Type of Advance Directive  Healthcare Power of Attorney, Living will   Pre-existing out of facility DNR order (yellow form or pink MOST form)     "MOST" Form in Place?             Consults  podiatry DVT Prophylaxis start Lovenox  Lab Results  Component Value Date   PLT 217 02/08/2015     Time Spent in minutes  6mnutes  PDustin FlockM.D on 02/10/2015 at 12:21 PM  Between 7am to 6pm - Pager - [phone]  After 6pm go to [email or web address] - password [email or web address]   Office  [phone]

## 2015-02-10 NOTE — Progress Notes (Signed)
Patient Demographics  Sharon [name], is a 82 y.o. female   MRN: [id number]   DOB - January 05, 1934  Admit Date - 02/07/2015    Outpatient Primary MD for the patient is Rusty Aus., MD  Consult requested in the Hospital by Dustin Flock, MD, On 02/10/2015     With History of -  Past Medical History  Diagnosis Date  . CHF (congestive heart failure) ([place])   . Diabetes mellitus without complication ([place])   . Hyperlipidemia   . Atrial fibrillation ([place])   . Hypothyroid   . Hypertension   . Stroke ([place])   . Neuropathy ([place])   . Anemia   . Edema     feet/ankles occas  . Osteomyelitis ([place])     left first metatarsal  . Hip fracture ([place])   . Cardiac arrest [facility])       Past Surgical History  Procedure Laterality Date  . Hemiarthroplasty hip Right   . Hemiarthroplasty hip Left   . Cholecystectomy    . Appendectomy    . Cardiac catheterization  08/25/13  . Coronary angioplasty    . Cataract extraction w/phaco Left 07/20/2014    Procedure: CATARACT EXTRACTION PHACO AND INTRAOCULAR LENS PLACEMENT (IOC);  Surgeon: Leandrew Koyanagi, MD;  Location: [facility] ORS;  Service: Ophthalmology;  Laterality: Left;  [place]  1:18                 AP     23.6             CDE   9.69      lot #[id number]  . Incision and drainage Left 10/11/2014    Procedure: Removal of infected tibial sessmoid;  Surgeon: Albertine Patricia, DPM;  Location: [facility] ORS;  Service: Podiatry;  Laterality: Left;  . Hallux valgus akin Left 02/08/2015    Procedure: HALLUX VALGUS AKIN/ KELLER;  Surgeon: Albertine Patricia, DPM;  Location: [facility] ORS;  Service: Podiatry;  Laterality: Left;  IVA with Local needs 1 hour for this case   . Achilles tendon surgery Left 02/08/2015    Procedure: ACHILLES LENGTHENING/KIDNER;  Surgeon: Albertine Patricia, DPM;  Location: [facility] ORS;   Service: Podiatry;  Laterality: Left;    in for   No chief complaint on file.    HPI  Sharon [name]  is a 82 y.o. female, surgery on Thursday for a tendo Achilles lengthening and first metatarsal phalangeal joint arthroplasty. She states she is doing fairly well with minimal pain. Spell bedrest the last 2 days but will start to get her moving at this point. Signs are stable and she complains of no pain.   Social History Social History  Substance Use Topics  . Smoking status: Former Smoker -- 0.50 packs/day for 7 years    Types: Cigarettes    Quit date: 05/30/1989  . Smokeless tobacco: Never Used  . Alcohol Use: No    Family History Family History  Problem Relation Age of Onset  . Heart attack Mother     Prior to Admission medications   Medication Sig Start Date End Date Taking? Authorizing Provider  bisacodyl (DULCOLAX) 5 MG EC tablet Take 1 tablet (5 mg total) by mouth daily. 11/08/14  Yes Rusty Aus, MD  capsaicin (ZOSTRIX) 0.025 % cream Apply topically 2 (two) times daily. 11/08/14  Yes Rusty Aus, MD  cholecalciferol 2000 UNITS TABS Take 1 tablet (2,000 Units total) by mouth daily. 11/08/14  Yes Rusty Aus, MD  ferrous sulfate 325 (65 FE) MG tablet Take 325 mg by mouth daily with breakfast.   Yes Historical Provider, MD  gabapentin (NEURONTIN) 600 MG tablet Take one tablet by mouth twice daily for neuropathy   Yes Historical Provider, MD  levothyroxine (SYNTHROID, LEVOTHROID) 112 MCG tablet Take one tablet by mouth once daily 30 minutes before breakfast once daily for hypothyroid   Yes Historical Provider, MD  lisinopril (PRINIVIL,ZESTRIL) 10 MG tablet Take 10 mg by mouth daily.   Yes Historical Provider, MD  simvastatin (ZOCOR) 40 MG tablet Take one tablet by mouth once daily at bedtime for cholesterol   Yes Historical Provider, MD  temazepam (RESTORIL) 30 MG capsule Take 1 capsule (30 mg total) by mouth at bedtime. 11/08/14  Yes Rusty Aus, MD  acetaminophen  (TYLENOL) 500 MG tablet Take 500 mg by mouth every 4 (four) hours as needed for mild pain or moderate pain.    Historical Provider, MD  amiodarone (PACERONE) 200 MG tablet Take one tablet by mouth once daily for AFIB    Historical Provider, MD  aspirin 81 MG tablet Take 81 mg by mouth daily.    Historical Provider, MD  doxycycline (VIBRA-TABS) 100 MG tablet Take 1 tablet (100 mg total) by mouth 2 (two) times daily. Patient not taking: Reported on 02/07/2015 11/08/14   Rusty Aus, MD  GLIPIZIDE XL 2.5 MG 24 hr tablet Take 2.5 mg by mouth daily. Reported on 02/07/2015 10/14/14   Historical Provider, MD  metroNIDAZOLE (FLAGYL) 500 MG tablet Take 1 tablet (500 mg total) by mouth every 8 (eight) hours. Patient not taking: Reported on 02/07/2015 11/08/14   Rusty Aus, MD  Multiple Vitamins-Minerals (PRESERVISION AREDS 2 PO) Take 2 tablets by mouth daily. Reported on 02/07/2015    Historical Provider, MD  pantoprazole (PROTONIX) 40 MG tablet Take 1 tablet (40 mg total) by mouth daily. Patient not taking: Reported on 02/07/2015 11/08/14   Rusty Aus, MD    Anti-infectives    Start     Dose/Rate Route Frequency Ordered Stop   02/09/15 0800  vancomycin (VANCOCIN) IVPB 750 mg/150 ml premix     750 mg 150 mL/hr over 60 Minutes Intravenous Every 24 hours 02/08/15 1450     02/08/15 1600  vancomycin (VANCOCIN) IVPB 1000 mg/200 mL premix     1,000 mg 200 mL/hr over 60 Minutes Intravenous  Once 02/08/15 1450 02/08/15 1732   02/08/15 1500  piperacillin-tazobactam (ZOSYN) IVPB 3.375 g     3.375 g 12.5 mL/hr over 240 Minutes Intravenous 3 times per day 02/08/15 1441        Scheduled Meds: . amiodarone  200 mg Oral Daily  . bisacodyl  5 mg Oral Daily  . cholecalciferol  2,000 Units Oral Daily  . docusate sodium  200 mg Oral BID  . enoxaparin (LOVENOX) injection  40 mg Subcutaneous Q24H  . ferrous sulfate  325 mg Oral Q breakfast  . gabapentin  600 mg Oral BID  . glipiZIDE  2.5 mg Oral Q breakfast   . insulin aspart  0-9 Units Subcutaneous TID WC  . levothyroxine  112 mcg Oral QAC breakfast  . lisinopril  10 mg Oral Daily  . pantoprazole  40  mg Oral Daily  . piperacillin-tazobactam (ZOSYN)  IV  3.375 g Intravenous 3 times per day  . simvastatin  40 mg Oral q1800  . temazepam  30 mg Oral QHS  . vancomycin  750 mg Intravenous Q24H   Continuous Infusions: . sodium chloride 50 mL/hr at 02/09/15 1410   PRN Meds:.acetaminophen, morphine injection, oxyCODONE-acetaminophen  Allergies  Allergen Reactions  . Codeine Other (See Comments)    Other reaction(s): Other (See Comments) Patient states she did not feel herself when she took it. unknown  . Cymbalta [Duloxetine Hcl] Other (See Comments)    Confusion, disorientation  . Duloxetine     Other reaction(s): Other (See Comments) Confusion, disorientation  . Lyrica [Pregabalin] Other (See Comments)    Patient and son states this makes the patient confused.    Physical Exam  Vitals  Blood pressure 99/57, pulse 61, temperature 98.6 F (37 C), temperature source Oral, resp. rate 16, height '5\' 5"'$  (1.651 m), weight 58.514 kg (129 lb), SpO2 93 %.  Lower Extremity exam: Bandages to the left foot and lower leg are clean dry and intact and still covered up   CBC  Recent Labs Lab 02/07/15 1652 02/08/15 0518  WBC 7.8 6.3  HGB 12.1 11.1*  HCT 37.3 33.8*  PLT 231 217  MCV 84.1 84.9  MCH 27.3 27.9  MCHC 32.5 32.8  RDW 16.7* 16.5*   ------------------------------------------------------------------------------------------------------------------  Chemistries   Recent Labs Lab 02/07/15 1652 02/08/15 0518 02/10/15 0409  NA 139 140  --   K 4.3 4.2  --   CL 108 110  --   CO2 23 23  --   GLUCOSE 141* 134*  --   BUN 12 18  --   CREATININE 1.13* 1.21* 0.89  CALCIUM 8.9 8.4*  --    ------------------------------------------------------------------------------------------------------------------ estimated creatinine  clearance is 46.1 mL/min (by C-G formula based on Cr of 0.89). ------------------------------------------------------------------------------------------------------------------ No results for input(s): TSH, T4TOTAL, T3FREE, THYROIDAB in the last 72 hours.  Invalid input(s): FREET3   Coagulation profile  Recent Labs Lab 02/07/15 1652  INR 0.94   ------------------------------------------------------------------------------------------------------------------- No results for input(s): DDIMER in the last 72 hours. -------------------------------------------------------------------------------------------------------------------  Cardiac Enzymes No results for input(s): CKMB, TROPONINI, MYOGLOBIN in the last 168 hours.  Invalid input(s): CK ------------------------------------------------------------------------------------------------------------------ Invalid input(s): POCBNP   ---------------------------------------------------------------------------------------------------------------  Urinalysis    Component Value Date/Time   COLORURINE YELLOW* 11/05/2014 1142   COLORURINE Yellow 08/18/2013 0253   APPEARANCEUR CLEAR* 11/05/2014 1142   APPEARANCEUR Hazy 08/18/2013 0253   LABSPEC 1.013 11/05/2014 1142   LABSPEC 1.012 08/18/2013 0253   PHURINE 5.0 11/05/2014 1142   PHURINE 5.0 08/18/2013 0253   GLUCOSEU NEGATIVE 11/05/2014 1142   GLUCOSEU >=500 08/18/2013 0253   HGBUR NEGATIVE 11/05/2014 1142   HGBUR Negative 08/18/2013 0253   BILIRUBINUR NEGATIVE 11/05/2014 1142   BILIRUBINUR Negative 08/18/2013 0253   KETONESUR NEGATIVE 11/05/2014 1142   KETONESUR Negative 08/18/2013 0253   PROTEINUR NEGATIVE 11/05/2014 1142   PROTEINUR 100 mg/dL 08/18/2013 0253   NITRITE NEGATIVE 11/05/2014 1142   NITRITE Negative 08/18/2013 0253   LEUKOCYTESUR TRACE* 11/05/2014 1142   LEUKOCYTESUR Trace 08/18/2013 0253     Assessment & Plan: Overall the patient is doing well. Plan: Start  physical therapy to help transfer and moved from bed to chair at this point. I applied a pneumatic cast walker to her left foot and leg today and will order PT Lee bandaging clean dry and intact we'll change the dressing tomorrow.  Active Problems:  Foot ulcer ([place])  Perry Mount M.D on 02/10/2015 at 8:34 AM  Thank you for the consult, we will follow the patient with you in the Hospital.

## 2015-02-10 NOTE — Progress Notes (Signed)
Physical Therapy Evaluation Patient Details Name: PARISSA CHIAO MRN: [id number] DOB: 06-26-1933 Today's Date: 02/10/2015   History of Present Illness  Rehema Muffley is a 82 y.o. female with a known history of congestive heart failure, diabetes, hyperlipidemia, atrial fibrillation, hypothyroidism, hypertension, previous history of CVA and neuropathy who has been followed outpatient by podiatry. They requested patient be admitted for lower extremity ulcer and infection. They wanted patient admitted and started on IV antibiotics for surgery tomorrow. He otherwise denies any chest pain shortness of breath no palpitations.  Clinical Impression  Pt presents with deceased functional mobility and gait and would benefit from acute PT services to address objective findings.  Pt alert and oriented this session and following all commands.  Pt motivated to complete tasks herself and to be as independent as possible.  Pt with notable decreased muscle mass in B LE's with B LE weakness.  Pt has limited gait to 15' this session with RW and CGA.  Pt would benefit from f/u PT 5x/week low intensity in order to return to PLOF.    Follow Up Recommendations SNF    Equipment Recommendations  Rolling walker with 5" wheels    Recommendations for Other Services       Precautions / Restrictions Precautions Precautions: Fall Precaution Comments: high Required Braces or Orthoses: Other Brace/Splint Other Brace/Splint: pneumatic cast walker  Restrictions Weight Bearing Restrictions: Yes LLE Weight Bearing: Partial weight bearing      Mobility  Bed Mobility Overal bed mobility: Needs Assistance Bed Mobility: Supine to Sit     Supine to sit: Min guard     General bed mobility comments: HOB elevated, exiting to R side of bed; verbal cues for body mechanics and hand placement  Transfers Overall transfer level: Needs assistance Equipment used: Rolling walker (2 wheeled) Transfers: Sit to/from Stand Sit  to Stand: Min guard         General transfer comment: Able to scoot hips to EOB for proper body mechanics; verbal cues to place at least one hand on bed.  Ambulation/Gait Ambulation/Gait assistance: Min guard Ambulation Distance (Feet): 15 Feet Assistive device: Rolling walker (2 wheeled) Gait Pattern/deviations: Step-to pattern     General Gait Details: Good safety awareness with ambulation and good control of RW while maintaining PWB'ing status.    Stairs            Wheelchair Mobility    Modified Rankin (Stroke Patients Only)       Balance Overall balance assessment: Needs assistance Sitting-balance support: No upper extremity supported;Feet supported Sitting balance-Leahy Scale: Good     Standing balance support: Bilateral upper extremity supported Standing balance-Leahy Scale: Fair                               Pertinent Vitals/Pain Pain Assessment: No/denies pain    Home Living Family/patient expects to be discharged to:: Skilled nursing facility Living Arrangements: Alone   Type of Home: House Home Access: Level entry     Home Layout: One level Home Equipment: Walker - 2 wheels;Cane - single point      Prior Function Level of Independence: Independent with assistive device(s)         Comments: I with ADL's, drives     Hand Dominance        Extremity/Trunk Assessment   Upper Extremity Assessment: Overall WFL for tasks assessed           Lower Extremity  Assessment: Generalized weakness (decreased  muscle mass B LE's; decreased muscle power.)         Communication   Communication: No difficulties  Cognition Arousal/Alertness: Awake/alert Behavior During Therapy: WFL for tasks assessed/performed Overall Cognitive Status: Within Functional Limits for tasks assessed                      General Comments      Exercises General Exercises - Lower Extremity Ankle Circles/Pumps: Strengthening;Both;10  reps Quad Sets: Strengthening;Both;10 reps Katarzyna Wolven Arc Quad: Strengthening;Both;10 reps Hip ABduction/ADduction: Strengthening;Both;10 reps      Assessment/Plan    PT Assessment Patient needs continued PT services  PT Diagnosis Difficulty walking;Generalized weakness   PT Problem List Decreased strength;Decreased activity tolerance;Decreased balance;Decreased mobility  PT Treatment Interventions Gait training;Functional mobility training;Therapeutic activities;Therapeutic exercise;Balance training;Patient/family education   PT Goals (Current goals can be found in the Care Plan section) Acute Rehab PT Goals Patient Stated Goal: "To be independent." PT Goal Formulation: With patient Time For Goal Achievement: 02/17/15 Potential to Achieve Goals: Good    Frequency 7X/week   Barriers to discharge Decreased caregiver support      Co-evaluation               End of Session Equipment Utilized During Treatment: Gait belt Activity Tolerance: Patient tolerated treatment well Patient left: in chair;with call bell/phone within reach;with chair alarm set Nurse Communication: Mobility status         Time: 2542-7062 PT Time Calculation (min) (ACUTE ONLY): 32 min   Charges:   PT Evaluation $PT Eval Low Complexity: 1 Procedure     PT G Codes:        Treveon Bourcier A Macklin Jacquin 03-03-15, 2:26 PM

## 2015-02-11 DIAGNOSIS — I10 Essential (primary) hypertension: Secondary | ICD-10-CM | POA: Diagnosis not present

## 2015-02-11 DIAGNOSIS — E118 Type 2 diabetes mellitus with unspecified complications: Secondary | ICD-10-CM | POA: Diagnosis not present

## 2015-02-11 DIAGNOSIS — I4891 Unspecified atrial fibrillation: Secondary | ICD-10-CM | POA: Diagnosis not present

## 2015-02-11 DIAGNOSIS — L089 Local infection of the skin and subcutaneous tissue, unspecified: Secondary | ICD-10-CM | POA: Diagnosis not present

## 2015-02-11 LAB — GLUCOSE, CAPILLARY
GLUCOSE-CAPILLARY: 107 mg/dL — AB (ref 65–99)
GLUCOSE-CAPILLARY: 115 mg/dL — AB (ref 65–99)
Glucose-Capillary: 127 mg/dL — ABNORMAL HIGH (ref 65–99)
Glucose-Capillary: 169 mg/dL — ABNORMAL HIGH (ref 65–99)

## 2015-02-11 LAB — CBC
HEMATOCRIT: 31.3 % — AB (ref 35.0–47.0)
HEMOGLOBIN: 10.3 g/dL — AB (ref 12.0–16.0)
MCH: 27.9 pg (ref 26.0–34.0)
MCHC: 33 g/dL (ref 32.0–36.0)
MCV: 84.6 fL (ref 80.0–100.0)
Platelets: 189 10*3/uL (ref 150–440)
RBC: 3.7 MIL/uL — AB (ref 3.80–5.20)
RDW: 16.6 % — ABNORMAL HIGH (ref 11.5–14.5)
WBC: 5.7 10*3/uL (ref 3.6–11.0)

## 2015-02-11 MED ORDER — DOXYCYCLINE HYCLATE 100 MG PO TABS
100.0000 mg | ORAL_TABLET | Freq: Two times a day (BID) | ORAL | Status: DC
  Administered 2015-02-11 – 2015-02-12 (×2): 100 mg via ORAL
  Filled 2015-02-11 (×2): qty 1

## 2015-02-11 NOTE — Progress Notes (Signed)
Physical Therapy Treatment Patient Details Name: Sharon [name] MRN: [id number] DOB: [birth year] Today's Date: 02/11/2015    History of Present Illness Sharon [name] is a 82 y.o. female with a known history of congestive heart failure, diabetes, hyperlipidemia, atrial fibrillation, hypothyroidism, hypertension, previous history of CVA and neuropathy who has been followed outpatient by podiatry. They requested patient be admitted for lower extremity ulcer and infection. They wanted patient admitted and started on IV antibiotics for surgery tomorrow. He otherwise denies any chest pain shortness of breath no palpitations.    PT Comments    Pt was able to work on transfer control and walking with PWB maintained throughout.  Her plan is to go to SNF and she is comfortable with this, expecting transition tomorrow.  Follow Up Recommendations  SNF     Equipment Recommendations  Rolling walker with 5" wheels    Recommendations for Other Services       Precautions / Restrictions Precautions Precautions: Fall Required Braces or Orthoses: Other Brace/Splint Other Brace/Splint: pneumatic cast walker  Restrictions Weight Bearing Restrictions: Yes LLE Weight Bearing: Partial weight bearing    Mobility  Bed Mobility               General bed mobility comments: up when PT entered  Transfers Overall transfer level: Needs assistance Equipment used: Rolling walker (2 wheeled) Transfers: Sit to/from [facility] Sit to Stand: Min guard;Min assist Stand pivot transfers: Min guard       General transfer comment: able to stand from lower surfaces with minor help but using UE's heavily  Ambulation/Gait Ambulation/Gait assistance: Min guard Ambulation Distance (Feet): 30 Feet (x2) Assistive device: Rolling walker (2 wheeled) Gait Pattern/deviations: Step-through pattern;Step-to pattern;Wide base of support;Drifts right/left;Decreased stride length;Decreased stance  time - left;Decreased weight shift to left Gait velocity: reduced Gait velocity interpretation: Below normal speed for age/gender General Gait Details: Good safety awareness with ambulation and good control of RW while maintaining PWB'ing status.     Stairs            Wheelchair Mobility    Modified Rankin (Stroke Patients Only)       Balance     Sitting balance-Leahy Scale: Good       Standing balance-Leahy Scale: Fair                      Cognition Arousal/Alertness: Awake/alert Behavior During Therapy: WFL for tasks assessed/performed Overall Cognitive Status: Within Functional Limits for tasks assessed                      Exercises      General Comments        Pertinent Vitals/Pain Pain Assessment: No/denies pain    Home Living                      Prior Function            PT Goals (current goals can now be found in the care plan section) Acute Rehab PT Goals Patient Stated Goal: "To be independent." Progress towards PT goals: Progressing toward goals    Frequency  7X/week    PT Plan Current plan remains appropriate    Co-evaluation             End of Session Equipment Utilized During Treatment: Gait belt Activity Tolerance: Patient tolerated treatment well Patient left: in chair;with call bell/phone within reach;with chair alarm set  Time: 7573-2256 PT Time Calculation (min) (ACUTE ONLY): 26 min  Charges:  $Gait Training: 8-22 mins $Therapeutic Activity: 8-22 mins                    G Codes:      Ramond Dial 07-Mar-2015, 5:19 PM   Mee Hives, PT MS Acute Rehab Dept. Number: [facility] [phone] and [place] [phone]

## 2015-02-11 NOTE — Clinical Social Work Note (Signed)
Clinical Social Worker updated pt's son regarding discharge. Weekday CSW will follow up to confirm discharge and facilitate discharge to [facility]. CSW will continue to follow.   Darden Dates, MSW, LCSW  Clinical Social Worker  [phone]

## 2015-02-11 NOTE — Progress Notes (Signed)
ANTIBIOTIC CONSULT NOTE - follow up  Pharmacy Consult for Vancomycin and Zosyn Indication: Osteomyelitis  Allergies  Allergen Reactions  . Codeine Other (See Comments)    Other reaction(s): Other (See Comments) Patient states she did not feel herself when she took it. unknown  . Cymbalta [Duloxetine Hcl] Other (See Comments)    Confusion, disorientation  . Duloxetine     Other reaction(s): Other (See Comments) Confusion, disorientation  . Lyrica [Pregabalin] Other (See Comments)    Patient and son states this makes the patient confused.    Patient Measurements: Height: '5\' 5"'$  (165.1 cm) Weight: 129 lb (58.514 kg) IBW/kg (Calculated) : 57 Adjusted Body Weight: na  Vital Signs: Temp: 97.7 F (36.5 C) (01/29 0727) Temp Source: Oral (01/29 0727) BP: 145/52 mmHg (01/29 0727) Pulse Rate: 63 (01/29 0727) Intake/Output from previous day: 01/28 0701 - 01/29 0700 In: 480 [P.O.:480] Out: 850 [Urine:850]   Recent Labs  02/10/15 0409 02/11/15 0350  WBC  --  5.7  HGB  --  10.3*  PLT  --  189  CREATININE 0.89  --    Estimated Creatinine Clearance: 46.1 mL/min (by C-G formula based on Cr of 0.89). No results for input(s): VANCOTROUGH, VANCOPEAK, VANCORANDOM, GENTTROUGH, GENTPEAK, GENTRANDOM, TOBRATROUGH, TOBRAPEAK, TOBRARND, AMIKACINPEAK, AMIKACINTROU, AMIKACIN in the last 72 hours.   Microbiology: Recent Results (from the past 720 hour(s))  MRSA PCR Screening     Status: None   Collection Time: 02/08/15  4:40 PM  Result Value Ref Range Status   MRSA by PCR NEGATIVE NEGATIVE Final    Comment:        The GeneXpert MRSA Assay (FDA approved for NASAL specimens only), is one component of a comprehensive MRSA colonization surveillance program. It is not intended to diagnose MRSA infection nor to guide or monitor treatment for MRSA infections.     Medical History: Past Medical History  Diagnosis Date  . CHF (congestive heart failure) ([place])   . Diabetes mellitus  without complication ([place])   . Hyperlipidemia   . Atrial fibrillation ([place])   . Hypothyroid   . Hypertension   . Stroke ([place])   . Neuropathy ([place])   . Anemia   . Edema     feet/ankles occas  . Osteomyelitis ([place])     left first metatarsal  . Hip fracture ([place])   . Cardiac arrest [facility])     Medications:  Anti-infectives    Start     Dose/Rate Route Frequency Ordered Stop   02/09/15 0800  vancomycin (VANCOCIN) IVPB 750 mg/150 ml premix     750 mg 150 mL/hr over 60 Minutes Intravenous Every 24 hours 02/08/15 1450     02/08/15 1600  vancomycin (VANCOCIN) IVPB 1000 mg/200 mL premix     1,000 mg 200 mL/hr over 60 Minutes Intravenous  Once 02/08/15 1450 02/08/15 1732   02/08/15 1500  piperacillin-tazobactam (ZOSYN) IVPB 3.375 g     3.375 g 12.5 mL/hr over 240 Minutes Intravenous 3 times per day 02/08/15 1441       Assessment: Patient is a 82yo female admitted for foot ulcer and osteomyelitis. Pharmacy consulted to dose Vancomycin and Zosyn.  Ke=0.033, t1/2=21hr, Vd=41 L  Vancomycin trough ordered for 1/30.  Goal of Therapy:  Vancomycin trough level 15-20 mcg/ml Resolution of infection  Plan:  Continue Vancomycin '750mg'$  q24 hours and Zosyn 3.'375mg'$  IV q8 hours.  Patient is scheduled for D/C home tomorrow on oral antibiotcs. Pharmacy will continue to monitor labs and renal function and make  adjustments as needed.   Nancy Fetter, PharmD Pharmacy Resident 02/11/2015 10:10 AM

## 2015-02-11 NOTE — Progress Notes (Signed)
[place] [facility]                                                                                                                                                                                            Patient Demographics   Sharon [name], is a 82 y.o. female, DOB - 1933-06-29, GQQ:[id number]  Admit date - 02/07/2015   Admitting Physician Dustin Flock, MD  Outpatient Primary MD for the patient is Rusty Aus., MD   LOS - 4  Subjective: Patient currently sitting in the chair. Has not had a bowel movement according to the nurse. Blood pressure is running a little low   Review of Systems:   CONSTITUTIONAL: No documented fever. No fatigue, weakness. No weight gain, no weight loss.  EYES: No blurry or double vision.  ENT: No tinnitus. No postnasal drip. No redness of the oropharynx.  RESPIRATORY: No cough, no wheeze, no hemoptysis. No dyspnea.  CARDIOVASCULAR: No chest pain. No orthopnea. No palpitations. No syncope.  GASTROINTESTINAL: No nausea, no vomiting or diarrhea. No abdominal pain. No melena or hematochezia. Positive constipation GENITOURINARY: No dysuria or hematuria.  ENDOCRINE: No polyuria or nocturia. No heat or cold intolerance.  HEMATOLOGY: No anemia. No bruising. No bleeding.  INTEGUMENTARY: No rashes. No lesions.  MUSCULOSKELETAL: No arthritis. No swelling. No gout.  NEUROLOGIC: No numbness, tingling, or ataxia. No seizure-type activity.  PSYCHIATRIC: No anxiety. No insomnia. No ADD.    Vitals:   Filed Vitals:   02/10/15 1853 02/10/15 1946 02/11/15 0537 02/11/15 0727  BP: 146/54 147/45 121/52 145/52  Pulse:  83 59 63  Temp:  98.8 F (37.1 C) 98.4 F (36.9 C) 97.7 F (36.5 C)  TempSrc:  Oral Oral Oral  Resp:  '20 16 16  '$ Height:      Weight:      SpO2:  95% 96% 95%    Wt Readings from Last 3 Encounters:  02/08/15 58.514 kg (129 lb)  12/02/14 58.514 kg (129 lb)  11/05/14 66.543 kg (146 lb 11.2 oz)      Intake/Output Summary (Last 24 hours) at 02/11/15 1238 Last data filed at 02/11/15 1100  Gross per 24 hour  Intake    360 ml  Output    850 ml  Net   -490 ml    Physical Exam:   GENERAL: Pleasant-appearing in no apparent distress.  HEAD, EYES, EARS, NOSE AND THROAT: Atraumatic, normocephalic. Extraocular muscles are intact. Pupils equal and reactive to light. Sclerae anicteric. No conjunctival injection. No oro-pharyngeal erythema.  NECK: Supple. There is no jugular venous distention. No bruits, no lymphadenopathy, no thyromegaly.  HEART: Regular rate and rhythm,. No murmurs, no rubs, no clicks.  LUNGS: Clear to auscultation bilaterally. No rales or rhonchi. No wheezes.  ABDOMEN: Soft, flat, nontender, nondistended. Has good bowel sounds. No hepatosplenomegaly appreciated.  EXTREMITIES: No evidence of any cyanosis, clubbing, or peripheral edema.  +2 pedal and radial pulses bilaterally. His dressing in the foot NEUROLOGIC: The patient is alert, awake, and oriented x3 with no focal motor or sensory deficits appreciated bilaterally.  SKIN: Moist and warm with no rashes appreciated.  Psych: Not anxious, depressed LN: No inguinal LN enlargement    Antibiotics   Anti-infectives    Start     Dose/Rate Route Frequency Ordered Stop   02/09/15 0800  vancomycin (VANCOCIN) IVPB 750 mg/150 ml premix     750 mg 150 mL/hr over 60 Minutes Intravenous Every 24 hours 02/08/15 1450     02/08/15 1600  vancomycin (VANCOCIN) IVPB 1000 mg/200 mL premix     1,000 mg 200 mL/hr over 60 Minutes Intravenous  Once 02/08/15 1450 02/08/15 1732   02/08/15 1500  piperacillin-tazobactam (ZOSYN) IVPB 3.375 g     3.375 g 12.5 mL/hr over 240 Minutes Intravenous 3 times per day 02/08/15 1441        Medications   Scheduled Meds: . amiodarone  200 mg Oral Daily  . aspirin  81 mg Oral Daily  . bisacodyl  5 mg Oral Daily  . bisacodyl  10 mg Rectal Once  . cholecalciferol  2,000 Units Oral Daily  .  docusate sodium  200 mg Oral BID  . enoxaparin (LOVENOX) injection  40 mg Subcutaneous Q24H  . ferrous sulfate  325 mg Oral Q breakfast  . gabapentin  600 mg Oral BID  . glipiZIDE  2.5 mg Oral Q breakfast  . insulin aspart  0-9 Units Subcutaneous TID WC  . levothyroxine  112 mcg Oral QAC breakfast  . pantoprazole  40 mg Oral Daily  . piperacillin-tazobactam (ZOSYN)  IV  3.375 g Intravenous 3 times per day  . polyethylene glycol  17 g Oral Daily  . simvastatin  40 mg Oral q1800  . temazepam  30 mg Oral QHS  . vancomycin  750 mg Intravenous Q24H   Continuous Infusions:   PRN Meds:.acetaminophen, lactulose, morphine injection, oxyCODONE-acetaminophen   Data Review:   Micro Results Recent Results (from the past 240 hour(s))  MRSA PCR Screening     Status: None   Collection Time: 02/08/15  4:40 PM  Result Value Ref Range Status   MRSA by PCR NEGATIVE NEGATIVE Final    Comment:        The GeneXpert MRSA Assay (FDA approved for NASAL specimens only), is one component of a comprehensive MRSA colonization surveillance program. It is not intended to diagnose MRSA infection nor to guide or monitor treatment for MRSA infections.     Radiology Reports No results found.   CBC  Recent Labs Lab 02/07/15 1652 02/08/15 0518 02/11/15 0350  WBC 7.8 6.3 5.7  HGB 12.1 11.1* 10.3*  HCT 37.3 33.8* 31.3*  PLT 231 217 189  MCV 84.1 84.9 84.6  MCH 27.3 27.9 27.9  MCHC 32.5 32.8 33.0  RDW 16.7* 16.5* 16.6*    Chemistries   Recent Labs Lab 02/07/15 1652 02/08/15 0518 02/10/15 0409  NA 139 140  --   K 4.3 4.2  --   CL 108 110  --   CO2 23 23  --   GLUCOSE 141* 134*  --   BUN 12 18  --  CREATININE 1.13* 1.21* 0.89  CALCIUM 8.9 8.4*  --    ------------------------------------------------------------------------------------------------------------------ estimated creatinine clearance is 46.1 mL/min (by C-G formula based on Cr of  0.89). ------------------------------------------------------------------------------------------------------------------ No results for input(s): HGBA1C in the last 72 hours. ------------------------------------------------------------------------------------------------------------------ No results for input(s): CHOL, HDL, LDLCALC, TRIG, CHOLHDL, LDLDIRECT in the last 72 hours. ------------------------------------------------------------------------------------------------------------------ No results for input(s): TSH, T4TOTAL, T3FREE, THYROIDAB in the last 72 hours.  Invalid input(s): FREET3 ------------------------------------------------------------------------------------------------------------------ No results for input(s): VITAMINB12, FOLATE, FERRITIN, TIBC, IRON, RETICCTPCT in the last 72 hours.  Coagulation profile  Recent Labs Lab 02/07/15 1652  INR 0.94    No results for input(s): DDIMER in the last 72 hours.  Cardiac Enzymes No results for input(s): CKMB, TROPONINI, MYOGLOBIN in the last 168 hours.  Invalid input(s): CK ------------------------------------------------------------------------------------------------------------------ Invalid input(s): [place]   Patient is a 82 year old white female with foot ulcer and infection  1. Left foot diabetic ulcer with infection:   Change to doxycycline  2. Chronic atrial fibrillation continue amiodarone resume aspirin  3. Diabetes type 2 continue glipizide and sliding scale   4. Hypertensioblood pressure continue lisinopril   5. Hypothyrddism continue Synthroid as taking at home  6. Miscellaneous SCDs for DVT prophylaxis  Lovenox  All the records are reviewed and case discussed with ED provider.      Code Status Orders        Start     Ordered   02/07/15 1606  Full code   Continuous     02/07/15 1605    Code Status History    Date Active Date Inactive Code Status Order ID  Comments User Context   11/05/2014  3:51 PM 11/09/2014  3:36 PM Full Code [id number]  Gladstone Lighter, MD Inpatient   10/10/2014 11:12 AM 10/17/2014  5:50 PM Full Code [id number]  Epifanio Lesches, MD Inpatient    Advance Directive Documentation        Most Recent Value   Type of Advance Directive  Healthcare Power of Attorney, Living will   Pre-existing out of facility DNR order (yellow form or pink MOST form)     "MOST" Form in Place?             Consults  podiatry DVT Prophylaxis start Lovenox  Lab Results  Component Value Date   PLT 189 02/11/2015     Time Spent in minutes  16mnutes  PDustin FlockM.D on 02/11/2015 at 12:38 PM  Between 7am to 6pm - Pager - [phone]  After 6pm go to [email or web address] - password [email or web address]   Office  [phone]

## 2015-02-11 NOTE — Progress Notes (Signed)
Patient Demographics  Sharon [name], is a 82 y.o. female   MRN: [id number]   DOB - 23-Sep-1933  Admit Date - 02/07/2015    Outpatient Primary MD for the patient is Rusty Aus., MD   With History of -  Past Medical History  Diagnosis Date  . CHF (congestive heart failure) ([place])   . Diabetes mellitus without complication ([place])   . Hyperlipidemia   . Atrial fibrillation ([place])   . Hypothyroid   . Hypertension   . Stroke ([place])   . Neuropathy ([place])   . Anemia   . Edema     feet/ankles occas  . Osteomyelitis ([place])     left first metatarsal  . Hip fracture ([place])   . Cardiac arrest [facility])       Past Surgical History  Procedure Laterality Date  . Hemiarthroplasty hip Right   . Hemiarthroplasty hip Left   . Cholecystectomy    . Appendectomy    . Cardiac catheterization  08/25/13  . Coronary angioplasty    . Cataract extraction w/phaco Left 07/20/2014    Procedure: CATARACT EXTRACTION PHACO AND INTRAOCULAR LENS PLACEMENT (IOC);  Surgeon: Leandrew Koyanagi, MD;  Location: [facility] ORS;  Service: Ophthalmology;  Laterality: Left;  [place]  1:18                 AP     23.6             CDE   9.69      lot #[id number]  . Incision and drainage Left 10/11/2014    Procedure: Removal of infected tibial sessmoid;  Surgeon: Albertine Patricia, DPM;  Location: [facility] ORS;  Service: Podiatry;  Laterality: Left;  . Hallux valgus akin Left 02/08/2015    Procedure: HALLUX VALGUS AKIN/ KELLER;  Surgeon: Albertine Patricia, DPM;  Location: [facility] ORS;  Service: Podiatry;  Laterality: Left;  IVA with Local needs 1 hour for this case   . Achilles tendon surgery Left 02/08/2015    Procedure: ACHILLES LENGTHENING/KIDNER;  Surgeon: Albertine Patricia, DPM;  Location: [facility] ORS;  Service: Podiatry;  Laterality: Left;    HPI  Sharon [name]  is a 82  y.o. female, 3 days status post time following an arthroplasty to the left great toe joint with an external K wire fixation and also an Achilles tendon lengthening to the left Achilles tendon. She also had a chronic ulceration and some infection on the plantar aspect of the foot do not be antibiotics to ensure reduced risk of infection surgically. She states she is doing well today with minimal problems. Did not have the boot on while she was lying in bed and states it was taken off last night so she can sleep.   Social History Social History  Substance Use Topics  . Smoking status: Former Smoker -- 0.50 packs/day for 7 years    Types: Cigarettes    Quit date: 05/30/1989  . Smokeless tobacco: Never Used  . Alcohol Use: No    Family History Family History  Problem Relation Age of Onset  . Heart attack Mother      Prior to Admission medications   Medication Sig Start Date End Date Taking? Authorizing  Provider  bisacodyl (DULCOLAX) 5 MG EC tablet Take 1 tablet (5 mg total) by mouth daily. 11/08/14  Yes Rusty Aus, MD  capsaicin (ZOSTRIX) 0.025 % cream Apply topically 2 (two) times daily. 11/08/14  Yes Rusty Aus, MD  cholecalciferol 2000 UNITS TABS Take 1 tablet (2,000 Units total) by mouth daily. 11/08/14  Yes Rusty Aus, MD  ferrous sulfate 325 (65 FE) MG tablet Take 325 mg by mouth daily with breakfast.   Yes Historical Provider, MD  gabapentin (NEURONTIN) 600 MG tablet Take one tablet by mouth twice daily for neuropathy   Yes Historical Provider, MD  levothyroxine (SYNTHROID, LEVOTHROID) 112 MCG tablet Take one tablet by mouth once daily 30 minutes before breakfast once daily for hypothyroid   Yes Historical Provider, MD  lisinopril (PRINIVIL,ZESTRIL) 10 MG tablet Take 10 mg by mouth daily.   Yes Historical Provider, MD  simvastatin (ZOCOR) 40 MG tablet Take one tablet by mouth once daily at bedtime for cholesterol   Yes Historical Provider, MD  temazepam (RESTORIL) 30 MG  capsule Take 1 capsule (30 mg total) by mouth at bedtime. 11/08/14  Yes Rusty Aus, MD  acetaminophen (TYLENOL) 500 MG tablet Take 500 mg by mouth every 4 (four) hours as needed for mild pain or moderate pain.    Historical Provider, MD  amiodarone (PACERONE) 200 MG tablet Take one tablet by mouth once daily for AFIB    Historical Provider, MD  aspirin 81 MG tablet Take 81 mg by mouth daily.    Historical Provider, MD  doxycycline (VIBRA-TABS) 100 MG tablet Take 1 tablet (100 mg total) by mouth 2 (two) times daily. Patient not taking: Reported on 02/07/2015 11/08/14   Rusty Aus, MD  GLIPIZIDE XL 2.5 MG 24 hr tablet Take 2.5 mg by mouth daily. Reported on 02/07/2015 10/14/14   Historical Provider, MD  metroNIDAZOLE (FLAGYL) 500 MG tablet Take 1 tablet (500 mg total) by mouth every 8 (eight) hours. Patient not taking: Reported on 02/07/2015 11/08/14   Rusty Aus, MD  Multiple Vitamins-Minerals (PRESERVISION AREDS 2 PO) Take 2 tablets by mouth daily. Reported on 02/07/2015    Historical Provider, MD  pantoprazole (PROTONIX) 40 MG tablet Take 1 tablet (40 mg total) by mouth daily. Patient not taking: Reported on 02/07/2015 11/08/14   Rusty Aus, MD    Anti-infectives    Start     Dose/Rate Route Frequency Ordered Stop   02/09/15 0800  vancomycin (VANCOCIN) IVPB 750 mg/150 ml premix     750 mg 150 mL/hr over 60 Minutes Intravenous Every 24 hours 02/08/15 1450     02/08/15 1600  vancomycin (VANCOCIN) IVPB 1000 mg/200 mL premix     1,000 mg 200 mL/hr over 60 Minutes Intravenous  Once 02/08/15 1450 02/08/15 1732   02/08/15 1500  piperacillin-tazobactam (ZOSYN) IVPB 3.375 g     3.375 g 12.5 mL/hr over 240 Minutes Intravenous 3 times per day 02/08/15 1441        Scheduled Meds: . amiodarone  200 mg Oral Daily  . aspirin  81 mg Oral Daily  . bisacodyl  5 mg Oral Daily  . bisacodyl  10 mg Rectal Once  . cholecalciferol  2,000 Units Oral Daily  . docusate sodium  200 mg Oral BID  .  enoxaparin (LOVENOX) injection  40 mg Subcutaneous Q24H  . ferrous sulfate  325 mg Oral Q breakfast  . gabapentin  600 mg Oral BID  . glipiZIDE  2.5 mg  Oral Q breakfast  . insulin aspart  0-9 Units Subcutaneous TID WC  . levothyroxine  112 mcg Oral QAC breakfast  . pantoprazole  40 mg Oral Daily  . piperacillin-tazobactam (ZOSYN)  IV  3.375 g Intravenous 3 times per day  . polyethylene glycol  17 g Oral Daily  . simvastatin  40 mg Oral q1800  . temazepam  30 mg Oral QHS  . vancomycin  750 mg Intravenous Q24H   Continuous Infusions:  PRN Meds:.acetaminophen, lactulose, morphine injection, oxyCODONE-acetaminophen  Allergies  Allergen Reactions  . Codeine Other (See Comments)    Other reaction(s): Other (See Comments) Patient states she did not feel herself when she took it. unknown  . Cymbalta [Duloxetine Hcl] Other (See Comments)    Confusion, disorientation  . Duloxetine     Other reaction(s): Other (See Comments) Confusion, disorientation  . Lyrica [Pregabalin] Other (See Comments)    Patient and son states this makes the patient confused.    Physical Exam  Vitals  Blood pressure 145/52, pulse 63, temperature 97.7 F (36.5 C), temperature source Oral, resp. rate 16, height '5\' 5"'$  (1.651 m), weight 58.514 kg (129 lb), SpO2 95 %.  Lower Extremity exam: Bandages remove the left foot today and left Achilles region. She stable no evidence of infection in these regions. No pain or discomfort at this timeframe. Incision margin shows to be in good shape with no evidence of infection. This is both for the first MTPJ as well as Achilles region. 0.062 K wire exiting the distal great toe was stable and intact. Has a small amount of erythema at the pin site but is no drainage.  Data Review  CBC  Recent Labs Lab 02/07/15 1652 02/08/15 0518 02/11/15 0350  WBC 7.8 6.3 5.7  HGB 12.1 11.1* 10.3*  HCT 37.3 33.8* 31.3*  PLT 231 217 189  MCV 84.1 84.9 84.6  MCH 27.3 27.9 27.9  MCHC  32.5 32.8 33.0  RDW 16.7* 16.5* 16.6*   ------------------------------------------------------------------------------------------------------------------  Chemistries   Recent Labs Lab 02/07/15 1652 02/08/15 0518 02/10/15 0409  NA 139 140  --   K 4.3 4.2  --   CL 108 110  --   CO2 23 23  --   GLUCOSE 141* 134*  --   BUN 12 18  --   CREATININE 1.13* 1.21* 0.89  CALCIUM 8.9 8.4*  --    ------------------------------------------------------------------------------------------------------------------ estimated creatinine clearance is 46.1 mL/min (by C-G formula based on Cr of 0.89). ------------------------------------------------------------------------------------------------------------------ No results for input(s): TSH, T4TOTAL, T3FREE, THYROIDAB in the last 72 hours.  Invalid input(s): FREET3   Coagulation profile  Recent Labs Lab 02/07/15 1652  INR 0.94   ------------------------------------------------------------------------------------------------------------------- No results for input(s): DDIMER in the last 72 hours. -------------------------------------------------------------------------------------------------------------------  Cardiac Enzymes No results for input(s): CKMB, TROPONINI, MYOGLOBIN in the last 168 hours.  Invalid input(s): CK ------------------------------------------------------------------------------------------------------------------ Invalid input(s): POCBNP   ---------------------------------------------------------------------------------------------------------------  Urinalysis    Component Value Date/Time   COLORURINE YELLOW* 11/05/2014 1142   COLORURINE Yellow 08/18/2013 0253   APPEARANCEUR CLEAR* 11/05/2014 1142   APPEARANCEUR Hazy 08/18/2013 0253   LABSPEC 1.013 11/05/2014 1142   LABSPEC 1.012 08/18/2013 0253   PHURINE 5.0 11/05/2014 1142   PHURINE 5.0 08/18/2013 0253   GLUCOSEU NEGATIVE 11/05/2014 1142   GLUCOSEU  >=500 08/18/2013 0253   HGBUR NEGATIVE 11/05/2014 1142   HGBUR Negative 08/18/2013 0253   BILIRUBINUR NEGATIVE 11/05/2014 1142   BILIRUBINUR Negative 08/18/2013 0253   KETONESUR NEGATIVE 11/05/2014 1142   KETONESUR Negative  08/18/2013 0253   PROTEINUR NEGATIVE 11/05/2014 1142   PROTEINUR 100 mg/dL 08/18/2013 0253   NITRITE NEGATIVE 11/05/2014 1142   NITRITE Negative 08/18/2013 0253   LEUKOCYTESUR TRACE* 11/05/2014 1142   LEUKOCYTESUR Trace 08/18/2013 0253      Assessment & Plan: Overall think the patient is stable. Wounds look to be in good shape. We will redress the area today and reapplied a splint for her to use in bed but needs to have the boot on any time she tries to bear weight or transfer. Physical therapy did work with her yesterday at transferring. She will need skilled nursing assistance as she rehabilitates from this surgery. She has multiple medical issues. She has significant lower extremity and upper extremity weakness. The wound on the plantar aspect her foot is completely closed this point. Plan: Discharge tomorrow to [facility] skilled nursing for her postoperative rehabilitation. Suggests possible continuation of oral doxycycline at least for a week or so after her discharge until these wounds have a chance to heal up some more. I would like to follow her in my office not this week but the following week. Continue his only transfer to chair and bedside toilet at [facility].  Active Problems:   Foot ulcer (HCC)  Perry Mount M.D on 02/11/2015 at 9:07 AM

## 2015-02-12 ENCOUNTER — Encounter
Admission: RE | Admit: 2015-02-12 | Discharge: 2015-02-12 | Disposition: A | Discharge status: Home / Self Care | Payer: PPO | Source: Ambulatory Visit | Attending: Internal Medicine | Admitting: Internal Medicine

## 2015-02-12 DIAGNOSIS — E118 Type 2 diabetes mellitus with unspecified complications: Secondary | ICD-10-CM | POA: Diagnosis not present

## 2015-02-12 DIAGNOSIS — I110 Hypertensive heart disease with heart failure: Secondary | ICD-10-CM | POA: Diagnosis not present

## 2015-02-12 DIAGNOSIS — M2032 Hallux varus (acquired), left foot: Secondary | ICD-10-CM | POA: Diagnosis not present

## 2015-02-12 DIAGNOSIS — I4891 Unspecified atrial fibrillation: Secondary | ICD-10-CM | POA: Diagnosis not present

## 2015-02-12 DIAGNOSIS — M6281 Muscle weakness (generalized): Secondary | ICD-10-CM | POA: Diagnosis not present

## 2015-02-12 DIAGNOSIS — I5032 Chronic diastolic (congestive) heart failure: Secondary | ICD-10-CM | POA: Diagnosis not present

## 2015-02-12 DIAGNOSIS — E119 Type 2 diabetes mellitus without complications: Secondary | ICD-10-CM | POA: Insufficient documentation

## 2015-02-12 DIAGNOSIS — E039 Hypothyroidism, unspecified: Secondary | ICD-10-CM | POA: Diagnosis not present

## 2015-02-12 DIAGNOSIS — E1140 Type 2 diabetes mellitus with diabetic neuropathy, unspecified: Secondary | ICD-10-CM | POA: Diagnosis not present

## 2015-02-12 DIAGNOSIS — I480 Paroxysmal atrial fibrillation: Secondary | ICD-10-CM | POA: Diagnosis not present

## 2015-02-12 DIAGNOSIS — T148 Other injury of unspecified body region: Secondary | ICD-10-CM | POA: Diagnosis not present

## 2015-02-12 DIAGNOSIS — Z794 Long term (current) use of insulin: Secondary | ICD-10-CM | POA: Insufficient documentation

## 2015-02-12 DIAGNOSIS — M6702 Short Achilles tendon (acquired), left ankle: Secondary | ICD-10-CM | POA: Diagnosis not present

## 2015-02-12 DIAGNOSIS — E785 Hyperlipidemia, unspecified: Secondary | ICD-10-CM | POA: Diagnosis not present

## 2015-02-12 DIAGNOSIS — K219 Gastro-esophageal reflux disease without esophagitis: Secondary | ICD-10-CM | POA: Diagnosis not present

## 2015-02-12 DIAGNOSIS — I10 Essential (primary) hypertension: Secondary | ICD-10-CM | POA: Diagnosis not present

## 2015-02-12 DIAGNOSIS — D649 Anemia, unspecified: Secondary | ICD-10-CM | POA: Diagnosis not present

## 2015-02-12 DIAGNOSIS — Z9181 History of falling: Secondary | ICD-10-CM | POA: Diagnosis not present

## 2015-02-12 DIAGNOSIS — L97422 Non-pressure chronic ulcer of left heel and midfoot with fat layer exposed: Secondary | ICD-10-CM | POA: Diagnosis not present

## 2015-02-12 DIAGNOSIS — Z87891 Personal history of nicotine dependence: Secondary | ICD-10-CM | POA: Diagnosis not present

## 2015-02-12 DIAGNOSIS — E11621 Type 2 diabetes mellitus with foot ulcer: Secondary | ICD-10-CM | POA: Diagnosis not present

## 2015-02-12 DIAGNOSIS — L089 Local infection of the skin and subcutaneous tissue, unspecified: Secondary | ICD-10-CM | POA: Diagnosis not present

## 2015-02-12 LAB — SURGICAL PATHOLOGY

## 2015-02-12 LAB — GLUCOSE, CAPILLARY
GLUCOSE-CAPILLARY: 202 mg/dL — AB (ref 65–99)
Glucose-Capillary: 121 mg/dL — ABNORMAL HIGH (ref 65–99)
Glucose-Capillary: 125 mg/dL — ABNORMAL HIGH (ref 65–99)

## 2015-02-12 LAB — VANCOMYCIN, TROUGH: Vancomycin Tr: 10 ug/mL (ref 10–20)

## 2015-02-12 MED ORDER — OXYCODONE-ACETAMINOPHEN 5-325 MG PO TABS: 1.0000 | ORAL_TABLET | ORAL | Status: DC | PRN

## 2015-02-12 MED ORDER — DOXYCYCLINE HYCLATE 100 MG PO TABS: 100.0000 mg | ORAL_TABLET | Freq: Two times a day (BID) | ORAL | Status: AC

## 2015-02-12 NOTE — Progress Notes (Signed)
Report called to [place] at [facility].  EMS notified to transport the pt.

## 2015-02-12 NOTE — Discharge Instructions (Signed)
°  DIET:  Cardiac diet, carbohydrate controlled diet  DISCHARGE CONDITION:  Stable  ACTIVITY:  continuing with minimal weightbearing just transfer to a chair and bedside toilet using a walker. Cast walking boot needs to be on any time she is ambulating but she can wear the splint for the Ace wrap when she is in bed. This is to hold the foot in good position and prevent injury to the Achilles tendon lengthening.   OXYGEN:  Home Oxygen: No.   Oxygen Delivery: room air  DISCHARGE LOCATION:  nursing home    ADDITIONAL DISCHARGE INSTRUCTION:   If you experience worsening of your admission symptoms, develop shortness of breath, life threatening emergency, suicidal or homicidal thoughts you must seek medical attention immediately by calling 911 or calling your MD immediately  if symptoms less severe.  You Must read complete instructions/literature along with all the possible adverse reactions/side effects for all the Medicines you take and that have been prescribed to you. Take any new Medicines after you have completely understood and accpet all the possible adverse reactions/side effects.   Please note  You were cared for by a hospitalist during your hospital stay. If you have any questions about your discharge medications or the care you received while you were in the hospital after you are discharged, you can call the unit and asked to speak with the hospitalist on call if the hospitalist that took care of you is not available. Once you are discharged, your primary care physician will handle any further medical issues. Please note that NO REFILLS for any discharge medications will be authorized once you are discharged, as it is imperative that you return to your primary care physician (or establish a relationship with a primary care physician if you do not have one) for your aftercare needs so that they can reassess your need for medications and monitor your lab values.

## 2015-02-12 NOTE — NC FL2 (Signed)
[place] LEVEL OF CARE SCREENING TOOL     IDENTIFICATION  Patient Name: Sharon [name] Birthdate: 1933/06/13 Sex: female Admission Date (Current Location): 02/07/2015  [place] and [place] Number:  Engineering geologist and Address:  [facility], [address], [place], [place] [address]      Provider Number: [id number]  Attending Physician Name and Address:  Dustin Flock, MD  Relative Name and Phone Number:       Current Level of Care: Hospital Recommended Level of Care: [place] Prior Approval Number:    Date Approved/Denied:   PASRR Number:  [id number])  Discharge Plan: SNF    Current Diagnoses: Patient Active Problem List   Diagnosis Date Noted  . Foot ulcer ([place]) 02/07/2015  . ARF (acute renal failure) ([place]) 11/05/2014  . Diabetic foot infection ([place]) 10/10/2014  . Chronic diastolic heart failure ([place]) 05/31/2014  . HTN (hypertension) 05/31/2014  . DM (diabetes mellitus) type 2, uncontrolled, with ketoacidosis ([place]) 05/31/2014    Orientation RESPIRATION BLADDER Height & Weight     Self, Time, Situation, Place  Normal Incontinent Weight: 129 lb (58.514 kg) Height:  '5\' 5"'$  (165.1 cm)  BEHAVIORAL SYMPTOMS/MOOD NEUROLOGICAL BOWEL NUTRITION STATUS      Continent Diet (carb modified)  AMBULATORY STATUS COMMUNICATION OF NEEDS Skin   Extensive Assist Verbally Surgical wounds                       Personal Care Assistance Level of Assistance  Bathing, Feeding, Dressing Bathing Assistance: Maximum assistance Feeding assistance: Independent Dressing Assistance: Maximum assistance     Functional Limitations Info  Sight, Hearing, Speech Sight Info: Adequate Hearing Info: Adequate Speech Info: Adequate    SPECIAL CARE FACTORS FREQUENCY  PT (By licensed PT)                    Contractures      Additional Factors Info  Allergies, Isolation Precautions   Allergies Info:  (Codeine,  Cymbalta, Duloxetine, Lyrica)     Isolation Precautions Info:  (10/09/14 MRSA in foot wound)     Current Medications (02/12/2015):  This is the current hospital active medication list Current Facility-Administered Medications  Medication Dose Route Frequency Provider Last Rate Last Dose  . acetaminophen (TYLENOL) tablet 500 mg  500 mg Oral Q4H PRN Dustin Flock, MD      . amiodarone (PACERONE) tablet 200 mg  200 mg Oral Daily Dustin Flock, MD   200 mg at 02/12/15 0913  . aspirin chewable tablet 81 mg  81 mg Oral Daily Dustin Flock, MD   81 mg at 02/12/15 0914  . bisacodyl (DULCOLAX) EC tablet 5 mg  5 mg Oral Daily Dustin Flock, MD   5 mg at 02/12/15 0919  . bisacodyl (DULCOLAX) suppository 10 mg  10 mg Rectal Once Dustin Flock, MD   10 mg at 02/10/15 1445  . cholecalciferol (VITAMIN D) tablet 2,000 Units  2,000 Units Oral Daily Dustin Flock, MD   2,000 Units at 02/12/15 0913  . docusate sodium (COLACE) capsule 200 mg  200 mg Oral BID Dustin Flock, MD   200 mg at 02/12/15 0913  . doxycycline (VIBRA-TABS) tablet 100 mg  100 mg Oral Q12H Dustin Flock, MD   100 mg at 02/12/15 0913  . enoxaparin (LOVENOX) injection 40 mg  40 mg Subcutaneous Q24H Dustin Flock, MD   40 mg at 02/11/15 1321  . ferrous sulfate tablet 325 mg  325 mg Oral Q breakfast Dustin Flock, MD   325 mg at 02/11/15 0817  . gabapentin (NEURONTIN) tablet 600 mg  600 mg Oral BID Dustin Flock, MD   600 mg at 02/12/15 0913  . glipiZIDE (GLUCOTROL XL) 24 hr tablet 2.5 mg  2.5 mg Oral Q breakfast Dustin Flock, MD   2.5 mg at 02/12/15 0914  . insulin aspart (novoLOG) injection 0-9 Units  0-9 Units Subcutaneous TID WC Dustin Flock, MD   1 Units at 02/12/15 1151  . lactulose (CHRONULAC) 10 GM/15ML solution 30 g  30 g Oral BID PRN Dustin Flock, MD      . levothyroxine (SYNTHROID, LEVOTHROID) tablet 112 mcg  112 mcg Oral QAC breakfast Dustin Flock, MD   112 mcg at 02/12/15 0525  . morphine 2 MG/ML injection 1 mg  1  mg Intravenous Q4H PRN Dustin Flock, MD   1 mg at 02/10/15 1956  . oxyCODONE-acetaminophen (PERCOCET/ROXICET) 5-325 MG per tablet 1 tablet  1 tablet Oral Q4H PRN Dustin Flock, MD   1 tablet at 02/12/15 0957  . pantoprazole (PROTONIX) EC tablet 40 mg  40 mg Oral Daily Dustin Flock, MD   40 mg at 02/12/15 0913  . polyethylene glycol (MIRALAX / GLYCOLAX) packet 17 g  17 g Oral Daily Dustin Flock, MD   17 g at 02/12/15 0913  . simvastatin (ZOCOR) tablet 40 mg  40 mg Oral q1800 Dustin Flock, MD   40 mg at 02/11/15 1701  . temazepam (RESTORIL) capsule 30 mg  30 mg Oral QHS Dustin Flock, MD   30 mg at 02/11/15 2241     Discharge Medications: Please see discharge summary for a list of discharge medications.  Relevant Imaging Results:  Relevant Lab Results:   Additional Information  (SS: [id number])  Loralyn Freshwater, LCSW

## 2015-02-12 NOTE — Care Management Important Message (Signed)
Important Message  Patient Details  Name: Sharon [name] MRN: [id number] Date of Birth: 1933/07/10   Medicare Important Message Given:  Yes    [name] 02/12/2015, 1:31 PM

## 2015-02-12 NOTE — Progress Notes (Signed)
Patient Demographics  Sharon [name], is a 82 y.o. female   MRN: [id number]   DOB - 1933/08/22  Admit Date - 02/07/2015    Outpatient Primary MD for the patient is Rusty Aus., MD  Consult requested in the Hospital by Dustin Flock, MD, On 02/12/2015   R  With History of -  Past Medical History  Diagnosis Date  . CHF (congestive heart failure) ([place])   . Diabetes mellitus without complication ([place])   . Hyperlipidemia   . Atrial fibrillation ([place])   . Hypothyroid   . Hypertension   . Stroke ([place])   . Neuropathy ([place])   . Anemia   . Edema     feet/ankles occas  . Osteomyelitis ([place])     left first metatarsal  . Hip fracture ([place])   . Cardiac arrest [facility])       Past Surgical History  Procedure Laterality Date  . Hemiarthroplasty hip Right   . Hemiarthroplasty hip Left   . Cholecystectomy    . Appendectomy    . Cardiac catheterization  08/25/13  . Coronary angioplasty    . Cataract extraction w/phaco Left 07/20/2014    Procedure: CATARACT EXTRACTION PHACO AND INTRAOCULAR LENS PLACEMENT (IOC);  Surgeon: Leandrew Koyanagi, MD;  Location: [facility] ORS;  Service: Ophthalmology;  Laterality: Left;  [place]  1:18                 AP     23.6             CDE   9.69      lot #[id number]  . Incision and drainage Left 10/11/2014    Procedure: Removal of infected tibial sessmoid;  Surgeon: Albertine Patricia, DPM;  Location: [facility] ORS;  Service: Podiatry;  Laterality: Left;  . Hallux valgus akin Left 02/08/2015    Procedure: HALLUX VALGUS AKIN/ KELLER;  Surgeon: Albertine Patricia, DPM;  Location: [facility] ORS;  Service: Podiatry;  Laterality: Left;  IVA with Local needs 1 hour for this case   . Achilles tendon surgery Left 02/08/2015    Procedure: ACHILLES LENGTHENING/KIDNER;  Surgeon: Albertine Patricia, DPM;  Location: [facility] ORS;   Service: Podiatry;  Laterality: Left;   HPI  Sharon [name]  is a 82 y.o. female, she is 4 days status post Achilles tendon lengthening and arthroplasty with pinning of the first MTP joint left foot secondary to structural deformity which led ulceration infection underneath the plantar aspect of the left foot. She's been doing well and is gotten physical therapy.  Social History Social History  Substance Use Topics  . Smoking status: Former Smoker -- 0.50 packs/day for 7 years    Types: Cigarettes    Quit date: 05/30/1989  . Smokeless tobacco: Never Used  . Alcohol Use: No    Family History Family History  Problem Relation Age of Onset  . Heart attack Mother      Prior to Admission medications   Medication Sig Start Date End Date Taking? Authorizing Provider  bisacodyl (DULCOLAX) 5 MG EC tablet Take 1 tablet (5 mg total) by mouth daily. 11/08/14  Yes Rusty Aus, MD  capsaicin (ZOSTRIX) 0.025 % cream Apply topically 2 (two) times  daily. 11/08/14  Yes Rusty Aus, MD  cholecalciferol 2000 UNITS TABS Take 1 tablet (2,000 Units total) by mouth daily. 11/08/14  Yes Rusty Aus, MD  ferrous sulfate 325 (65 FE) MG tablet Take 325 mg by mouth daily with breakfast.   Yes Historical Provider, MD  gabapentin (NEURONTIN) 600 MG tablet Take one tablet by mouth twice daily for neuropathy   Yes Historical Provider, MD  levothyroxine (SYNTHROID, LEVOTHROID) 112 MCG tablet Take one tablet by mouth once daily 30 minutes before breakfast once daily for hypothyroid   Yes Historical Provider, MD  lisinopril (PRINIVIL,ZESTRIL) 10 MG tablet Take 10 mg by mouth daily.   Yes Historical Provider, MD  simvastatin (ZOCOR) 40 MG tablet Take one tablet by mouth once daily at bedtime for cholesterol   Yes Historical Provider, MD  temazepam (RESTORIL) 30 MG capsule Take 1 capsule (30 mg total) by mouth at bedtime. 11/08/14  Yes Rusty Aus, MD  acetaminophen (TYLENOL) 500 MG tablet Take 500 mg by mouth  every 4 (four) hours as needed for mild pain or moderate pain.    Historical Provider, MD  amiodarone (PACERONE) 200 MG tablet Take one tablet by mouth once daily for AFIB    Historical Provider, MD  aspirin 81 MG tablet Take 81 mg by mouth daily.    Historical Provider, MD  doxycycline (VIBRA-TABS) 100 MG tablet Take 1 tablet (100 mg total) by mouth 2 (two) times daily. Patient not taking: Reported on 02/07/2015 11/08/14   Rusty Aus, MD  GLIPIZIDE XL 2.5 MG 24 hr tablet Take 2.5 mg by mouth daily. Reported on 02/07/2015 10/14/14   Historical Provider, MD  metroNIDAZOLE (FLAGYL) 500 MG tablet Take 1 tablet (500 mg total) by mouth every 8 (eight) hours. Patient not taking: Reported on 02/07/2015 11/08/14   Rusty Aus, MD  Multiple Vitamins-Minerals (PRESERVISION AREDS 2 PO) Take 2 tablets by mouth daily. Reported on 02/07/2015    Historical Provider, MD  pantoprazole (PROTONIX) 40 MG tablet Take 1 tablet (40 mg total) by mouth daily. Patient not taking: Reported on 02/07/2015 11/08/14   Rusty Aus, MD    Anti-infectives    Start     Dose/Rate Route Frequency Ordered Stop   02/11/15 2200  doxycycline (VIBRA-TABS) tablet 100 mg     100 mg Oral Every 12 hours 02/11/15 1241     02/09/15 0800  vancomycin (VANCOCIN) IVPB 750 mg/150 ml premix  Status:  Discontinued     750 mg 150 mL/hr over 60 Minutes Intravenous Every 24 hours 02/08/15 1450 02/11/15 1241   02/08/15 1600  vancomycin (VANCOCIN) IVPB 1000 mg/200 mL premix     1,000 mg 200 mL/hr over 60 Minutes Intravenous  Once 02/08/15 1450 02/08/15 1732   02/08/15 1500  piperacillin-tazobactam (ZOSYN) IVPB 3.375 g  Status:  Discontinued     3.375 g 12.5 mL/hr over 240 Minutes Intravenous 3 times per day 02/08/15 1441 02/11/15 1241      Scheduled Meds: . amiodarone  200 mg Oral Daily  . aspirin  81 mg Oral Daily  . bisacodyl  5 mg Oral Daily  . bisacodyl  10 mg Rectal Once  . cholecalciferol  2,000 Units Oral Daily  . docusate sodium   200 mg Oral BID  . doxycycline  100 mg Oral Q12H  . enoxaparin (LOVENOX) injection  40 mg Subcutaneous Q24H  . ferrous sulfate  325 mg Oral Q breakfast  . gabapentin  600 mg Oral BID  .  glipiZIDE  2.5 mg Oral Q breakfast  . insulin aspart  0-9 Units Subcutaneous TID WC  . levothyroxine  112 mcg Oral QAC breakfast  . pantoprazole  40 mg Oral Daily  . polyethylene glycol  17 g Oral Daily  . simvastatin  40 mg Oral q1800  . temazepam  30 mg Oral QHS   Continuous Infusions:  PRN Meds:.acetaminophen, lactulose, morphine injection, oxyCODONE-acetaminophen  Allergies  Allergen Reactions  . Codeine Other (See Comments)    Other reaction(s): Other (See Comments) Patient states she did not feel herself when she took it. unknown  . Cymbalta [Duloxetine Hcl] Other (See Comments)    Confusion, disorientation  . Duloxetine     Other reaction(s): Other (See Comments) Confusion, disorientation  . Lyrica [Pregabalin] Other (See Comments)    Patient and son states this makes the patient confused.    Physical Exam  Vitals  Blood pressure 113/74, pulse 63, temperature 97.7 F (36.5 C), temperature source Oral, resp. rate 18, height '5\' 5"'$  (1.651 m), weight 58.514 kg (129 lb), SpO2 95 %.  Lower Extremity exam: Patient's bandages are clean dry and intact. He has a splint on in bed and is wearing her boot whenever she gets up to transfer put any weight on her foot. She is getting partial weightbearing as per my instructions and also with physical therapy assistance. Data Review  CBC  Recent Labs Lab 02/07/15 1652 02/08/15 0518 02/11/15 0350  WBC 7.8 6.3 5.7  HGB 12.1 11.1* 10.3*  HCT 37.3 33.8* 31.3*  PLT 231 217 189  MCV 84.1 84.9 84.6  MCH 27.3 27.9 27.9  MCHC 32.5 32.8 33.0  RDW 16.7* 16.5* 16.6*   ------------------------------------------------------------------------------------------------------------------  Chemistries   Recent Labs Lab 02/07/15 1652 02/08/15 0518  02/10/15 0409  NA 139 140  --   K 4.3 4.2  --   CL 108 110  --   CO2 23 23  --   GLUCOSE 141* 134*  --   BUN 12 18  --   CREATININE 1.13* 1.21* 0.89  CALCIUM 8.9 8.4*  --    ------------------------------------------------------------------------------------------------------------------ estimated creatinine clearance is 46.1 mL/min (by C-G formula based on Cr of 0.89). ---  ---------------------------------------------------------------------------------------------------------------  Urinalysis    Component Value Date/Time   COLORURINE YELLOW* 11/05/2014 1142   COLORURINE Yellow 08/18/2013 0253   APPEARANCEUR CLEAR* 11/05/2014 1142   APPEARANCEUR Hazy 08/18/2013 0253   LABSPEC 1.013 11/05/2014 1142   LABSPEC 1.012 08/18/2013 0253   PHURINE 5.0 11/05/2014 1142   PHURINE 5.0 08/18/2013 0253   GLUCOSEU NEGATIVE 11/05/2014 1142   GLUCOSEU >=500 08/18/2013 0253   HGBUR NEGATIVE 11/05/2014 1142   HGBUR Negative 08/18/2013 0253   BILIRUBINUR NEGATIVE 11/05/2014 1142   BILIRUBINUR Negative 08/18/2013 0253   KETONESUR NEGATIVE 11/05/2014 1142   KETONESUR Negative 08/18/2013 0253   PROTEINUR NEGATIVE 11/05/2014 1142   PROTEINUR 100 mg/dL 08/18/2013 0253   NITRITE NEGATIVE 11/05/2014 1142   NITRITE Negative 08/18/2013 0253   LEUKOCYTESUR TRACE* 11/05/2014 1142   LEUKOCYTESUR Trace 08/18/2013 0253     Assessment & Plan: The patient stable and should be ready to transfer. I would recommend continuing with minimal weightbearing just transfer to a chair and bedside toilet using a walker. Cast walking boot needs to be on any time she is ambulating but she can wear the splint for the Ace wrap when she is in bed. This is to hold the foot in good position and prevent injury to the Achilles tendon lengthening. Would also  recommend she continue doxycycline at this point orally. I like to see her back in my office next week.  Active Problems:   Foot ulcer (HCC)   Perry Mount M.D  on 02/12/2015 at 7:57 AM

## 2015-02-12 NOTE — Clinical Social Work Placement (Signed)
   CLINICAL SOCIAL WORK PLACEMENT  NOTE  Date:  02/12/2015  Patient Details  Name: Sharon [name] MRN: [id number] Date of Birth: 10/30/1933  Clinical Social Work is seeking post-discharge placement for this patient at the [place] level of care (*CSW will initial, date and re-position this form in  chart as items are completed):  Yes   Patient/family provided with [place] Work Department's list of facilities offering this level of care within the geographic area requested by the patient (or if unable, by the patient's family).  Yes   Patient/family informed of their freedom to choose among providers that offer the needed level of care, that participate in Medicare, Medicaid or managed care program needed by the patient, have an available bed and are willing to accept the patient.  Yes   Patient/family informed of [facility]'s ownership interest in [facility] and [facility], as well as of the fact that they are under no obligation to receive care at these facilities.  PASRR submitted to EDS on       PASRR number received on       Existing PASRR number confirmed on 02/09/15     FL2 transmitted to all facilities in geographic area requested by pt/family on 02/09/15     FL2 transmitted to all facilities within larger geographic area on       Patient informed that his/her managed care company has contracts with or will negotiate with certain facilities, including the following:        Yes   Patient/family informed of bed offers received.  Patient chooses bed at  [facility] )     Physician recommends and patient chooses bed at      Patient to be transferred to  [facility] ) on 02/12/15.  Patient to be transferred to facility by  [facility] )     Patient family notified on 02/12/15 of transfer.  Name of family member notified:   ([name] is at bedside and aware of D/C today. )     PHYSICIAN        Additional Comment:    _______________________________________________ Loralyn Freshwater, LCSW 02/12/2015, 2:42 PM

## 2015-02-12 NOTE — Progress Notes (Signed)
Patient is medically stable for D/C to [facility] today. Per Kim admissions coordinator at [facility] patient is going to room 214-A. RN will call report at [phone] and arrange EMS for transport. Health Team authorization has been received. Auth # [id number]. Clinical Education officer, museum (CSW) sent D/C Summary, FL2, and D/C Packet to [employer] via [employer]. Patient is aware of above. Patient's son [name] is at bedside and aware of above. Please reconsult if future social work needs arise. CSW signing off.   Blima Rich, LCSW [phone]

## 2015-02-12 NOTE — Discharge Summary (Signed)
Sharon [name], 82 y.o., DOB 1933-06-10, MRN [id number]. Admission date: 02/07/2015 Discharge Date 02/12/2015 Primary MD Rusty Aus., MD Admitting Physician Dustin Flock, MD  Admission Diagnosis  Diabetic Lt foot Ulcer left foot ulcer with fat layer exposed, type 2 DM with polyneuropathy, hallux malleus left foot  Discharge Diagnosis   Active Problems:  * Left foot ulcer * #1 tight Achilles tendon left foot. 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 K wire fixation andLengthening extensor hallucis longus tendon *CHF Diabetes Hyperlipidemia Atrial fibrillation Hypothyroidism Hypertension History of CVA Neuropathy      Hospital Course Sharon [name] is a 82 y.o. female with a known history of congestive heart failure, diabetes, hyperlipidemia, atrial fibrillation, hypothyroidism, hypertension, previous history of CVA and neuropathy who has been followed outpatient by podiatry. They requested patient be admitted for lower extremity ulcer and infection. Patient was admitted and taken to the operating room on January 26. She she underwent above-stated procedures. There were no growth in her wound cultures. She was recommended by podiatry to be treated with doxycycline. And the weightbearing instructions were given she is in need of rehabilitation which is currently being arranged.            Consults  podiatry  Significant Tests:  See full reports for all details      No results found.     Today   Subjective:   Sharon [name] feels well denies any complaints  Objective:   Blood pressure 113/74, pulse 63, temperature 97.7 F (36.5 C), temperature source Oral, resp. rate 18, height '5\' 5"'$  (1.651 m), weight 58.514 kg (129 lb), SpO2 95 %.  .  Intake/Output Summary (Last 24 hours)  at 02/12/15 1257 Last data filed at 02/12/15 0800  Gross per 24 hour  Intake    545 ml  Output    900 ml  Net   -355 ml    Exam VITAL SIGNS: Blood pressure 113/74, pulse 63, temperature 97.7 F (36.5 C), temperature source Oral, resp. rate 18, height '5\' 5"'$  (1.651 m), weight 58.514 kg (129 lb), SpO2 95 %.  GENERAL:  82 y.o.-year-old patient lying in the bed with no acute distress.  EYES: Pupils equal, round, reactive to light and accommodation. No scleral icterus. Extraocular muscles intact.  HEENT: Head atraumatic, normocephalic. Oropharynx and nasopharynx clear.  NECK:  Supple, no jugular venous distention. No thyroid enlargement, no tenderness.  LUNGS: Normal breath sounds bilaterally, no wheezing, rales,rhonchi or crepitation. No use of accessory muscles of respiration.  CARDIOVASCULAR: S1, S2 normal. No murmurs, rubs, or gallops.  ABDOMEN: Soft, nontender, nondistended. Bowel sounds present. No organomegaly or mass.  EXTREMITIES: No pedal edema, cyanosis, or clubbing. Dressing in the left foot  NEUROLOGIC: Cranial nerves II through XII are intact. Muscle strength 5/5 in all extremities. Sensation intact. Gait not checked.  PSYCHIATRIC: The patient is alert and oriented x 3.  SKIN: No obvious rash, lesion, or ulcer.   Data Review     CBC w Diff: Lab Results  Component Value Date   WBC 5.7 02/11/2015   WBC 9.9 10/19/2014   WBC 6.2 12/28/2013   HGB 10.3* 02/11/2015   HGB 10.1* 12/28/2013   HCT 31.3* 02/11/2015   HCT 31.6* 12/28/2013   PLT 189 02/11/2015   PLT 234 12/28/2013   LYMPHOPCT 15 11/07/2014   LYMPHOPCT 28.0 12/28/2013  MONOPCT 9 11/07/2014   MONOPCT 10.2 12/28/2013   MONOPCT 11 08/25/2013   EOSPCT 8 11/07/2014   EOSPCT 5.0 12/28/2013   BASOPCT 3 11/07/2014   BASOPCT 1.4 12/28/2013   CMP: Lab Results  Component Value Date   NA 140 02/08/2015   NA 136* 10/19/2014   NA 132* 04/13/2014   K 4.2 02/08/2015   K 3.8 04/13/2014   CL 110 02/08/2015   CL 100*  04/13/2014   CO2 23 02/08/2015   CO2 24 04/13/2014   BUN 18 02/08/2015   BUN 17 10/21/2014   BUN 15 04/13/2014   CREATININE 0.89 02/10/2015   CREATININE 1.2* 10/21/2014   CREATININE 1.04* 04/13/2014   GLU 59 10/19/2014   PROT 5.8* 11/07/2014   PROT 7.4 12/25/2013   ALBUMIN 3.0* 11/07/2014   ALBUMIN 3.3* 12/25/2013   BILITOT 0.3 11/07/2014   BILITOT 0.4 12/25/2013   ALKPHOS 98 11/07/2014   ALKPHOS 129* 12/25/2013   AST 60* 11/07/2014   AST 45* 12/25/2013   ALT 43 11/07/2014   ALT 28 12/25/2013  .  Micro Results Recent Results (from the past 240 hour(s))  MRSA PCR Screening     Status: None   Collection Time: 02/08/15  4:40 PM  Result Value Ref Range Status   MRSA by PCR NEGATIVE NEGATIVE Final    Comment:        The GeneXpert MRSA Assay (FDA approved for NASAL specimens only), is one component of a comprehensive MRSA colonization surveillance program. It is not intended to diagnose MRSA infection nor to guide or monitor treatment for MRSA infections.         Code Status Orders        Start     Ordered   02/07/15 1606  Full code   Continuous     02/07/15 1605    Code Status History    Date Active Date Inactive Code Status Order ID Comments User Context   11/05/2014  3:51 PM 11/09/2014  3:36 PM Full Code [id number]  Gladstone Lighter, MD Inpatient   10/10/2014 11:12 AM 10/17/2014  5:50 PM Full Code [id number]  Epifanio Lesches, MD Inpatient    Advance Directive Documentation        Most Recent Value   Type of Advance Directive  Healthcare Power of Attorney, Living will   Pre-existing out of facility DNR order (yellow form or pink MOST form)     "MOST" Form in Place?            Follow-up Information    Follow up with [facility] .   Specialty:  [place] information:   [address] [place]      Follow up with Perry Mount, DPM In 6 days.   Specialty:   Podiatry   Contact information:   [facility] [place] [address] [phone]       Follow up with Rusty Aus., MD In 10 days.   Specialty:  Internal Medicine   Contact information:   [place] [address] [phone]       Discharge Medications     Medication List    STOP taking these medications        metroNIDAZOLE 500 MG tablet  Commonly known as:  FLAGYL      TAKE these medications        acetaminophen 500 MG tablet  Commonly  known as:  TYLENOL  Take 500 mg by mouth every 4 (four) hours as needed for mild pain or moderate pain.     amiodarone 200 MG tablet  Commonly known as:  PACERONE  Take one tablet by mouth once daily for AFIB     aspirin 81 MG tablet  Take 81 mg by mouth daily.     bisacodyl 5 MG EC tablet  Commonly known as:  DULCOLAX  Take 1 tablet (5 mg total) by mouth daily.     capsaicin 0.025 % cream  Commonly known as:  ZOSTRIX  Apply topically 2 (two) times daily.     Cholecalciferol 2000 units Tabs  Take 1 tablet (2,000 Units total) by mouth daily.     doxycycline 100 MG tablet  Commonly known as:  VIBRA-TABS  Take 1 tablet (100 mg total) by mouth every 12 (twelve) hours.     ferrous sulfate 325 (65 FE) MG tablet  Take 325 mg by mouth daily with breakfast.     gabapentin 600 MG tablet  Commonly known as:  NEURONTIN  Take one tablet by mouth twice daily for neuropathy     GLIPIZIDE XL 2.5 MG 24 hr tablet  Generic drug:  glipiZIDE  Take 2.5 mg by mouth daily. Reported on 02/07/2015     levothyroxine 112 MCG tablet  Commonly known as:  SYNTHROID, LEVOTHROID  Take one tablet by mouth once daily 30 minutes before breakfast once daily for hypothyroid     lisinopril 10 MG tablet  Commonly known as:  PRINIVIL,ZESTRIL  Take 10 mg by mouth daily.     oxyCODONE-acetaminophen 5-325 MG tablet  Commonly known as:  PERCOCET/ROXICET   Take 1 tablet by mouth every 4 (four) hours as needed for moderate pain.     pantoprazole 40 MG tablet  Commonly known as:  PROTONIX  Take 1 tablet (40 mg total) by mouth daily.     PRESERVISION AREDS 2 PO  Take 2 tablets by mouth daily. Reported on 02/07/2015     simvastatin 40 MG tablet  Commonly known as:  ZOCOR  Take one tablet by mouth once daily at bedtime for cholesterol     temazepam 30 MG capsule  Commonly known as:  RESTORIL  Take 1 capsule (30 mg total) by mouth at bedtime.           Total Time in preparing paper work, data evaluation and todays exam - 35 minutes  Dustin Flock M.D on 02/12/2015 at 12:57 PM  [facility]   Office  [phone]

## 2015-02-13 DIAGNOSIS — Z794 Long term (current) use of insulin: Secondary | ICD-10-CM | POA: Diagnosis not present

## 2015-02-13 DIAGNOSIS — E119 Type 2 diabetes mellitus without complications: Secondary | ICD-10-CM | POA: Diagnosis not present

## 2015-02-13 DIAGNOSIS — E11621 Type 2 diabetes mellitus with foot ulcer: Secondary | ICD-10-CM | POA: Diagnosis not present

## 2015-02-13 LAB — GLUCOSE, CAPILLARY
GLUCOSE-CAPILLARY: 118 mg/dL — AB (ref 65–99)
Glucose-Capillary: 105 mg/dL — ABNORMAL HIGH (ref 65–99)
Glucose-Capillary: 144 mg/dL — ABNORMAL HIGH (ref 65–99)
Glucose-Capillary: 154 mg/dL — ABNORMAL HIGH (ref 65–99)

## 2015-02-14 ENCOUNTER — Encounter
Admission: RE | Admit: 2015-02-14 | Discharge: 2015-02-14 | Disposition: A | Discharge status: Home / Self Care | Payer: PPO | Source: Ambulatory Visit | Attending: Internal Medicine | Admitting: Internal Medicine

## 2015-02-14 DIAGNOSIS — K219 Gastro-esophageal reflux disease without esophagitis: Secondary | ICD-10-CM | POA: Diagnosis not present

## 2015-02-14 DIAGNOSIS — M6281 Muscle weakness (generalized): Secondary | ICD-10-CM | POA: Diagnosis not present

## 2015-02-14 DIAGNOSIS — Z87891 Personal history of nicotine dependence: Secondary | ICD-10-CM | POA: Diagnosis not present

## 2015-02-14 DIAGNOSIS — E11621 Type 2 diabetes mellitus with foot ulcer: Secondary | ICD-10-CM | POA: Diagnosis not present

## 2015-02-14 DIAGNOSIS — L97422 Non-pressure chronic ulcer of left heel and midfoot with fat layer exposed: Secondary | ICD-10-CM | POA: Diagnosis not present

## 2015-02-14 DIAGNOSIS — Z9181 History of falling: Secondary | ICD-10-CM | POA: Diagnosis not present

## 2015-02-14 DIAGNOSIS — I480 Paroxysmal atrial fibrillation: Secondary | ICD-10-CM | POA: Diagnosis not present

## 2015-02-14 DIAGNOSIS — I5032 Chronic diastolic (congestive) heart failure: Secondary | ICD-10-CM | POA: Diagnosis not present

## 2015-02-14 DIAGNOSIS — D649 Anemia, unspecified: Secondary | ICD-10-CM | POA: Diagnosis not present

## 2015-02-14 DIAGNOSIS — E785 Hyperlipidemia, unspecified: Secondary | ICD-10-CM | POA: Diagnosis not present

## 2015-02-14 DIAGNOSIS — M6702 Short Achilles tendon (acquired), left ankle: Secondary | ICD-10-CM | POA: Diagnosis not present

## 2015-02-14 DIAGNOSIS — E1140 Type 2 diabetes mellitus with diabetic neuropathy, unspecified: Secondary | ICD-10-CM | POA: Diagnosis not present

## 2015-02-14 DIAGNOSIS — E039 Hypothyroidism, unspecified: Secondary | ICD-10-CM | POA: Diagnosis not present

## 2015-02-14 DIAGNOSIS — I110 Hypertensive heart disease with heart failure: Secondary | ICD-10-CM | POA: Diagnosis not present

## 2015-02-14 DIAGNOSIS — E119 Type 2 diabetes mellitus without complications: Secondary | ICD-10-CM | POA: Diagnosis not present

## 2015-02-14 DIAGNOSIS — M2032 Hallux varus (acquired), left foot: Secondary | ICD-10-CM | POA: Diagnosis not present

## 2015-02-14 LAB — GLUCOSE, CAPILLARY
GLUCOSE-CAPILLARY: 83 mg/dL (ref 65–99)
GLUCOSE-CAPILLARY: 114 mg/dL — AB (ref 65–99)
GLUCOSE-CAPILLARY: 205 mg/dL — AB (ref 65–99)
Glucose-Capillary: 120 mg/dL — ABNORMAL HIGH (ref 65–99)

## 2015-02-15 DIAGNOSIS — E119 Type 2 diabetes mellitus without complications: Secondary | ICD-10-CM | POA: Diagnosis not present

## 2015-02-15 LAB — GLUCOSE, CAPILLARY
GLUCOSE-CAPILLARY: 133 mg/dL — AB (ref 65–99)
GLUCOSE-CAPILLARY: 104 mg/dL — AB (ref 65–99)
Glucose-Capillary: 85 mg/dL (ref 65–99)
Glucose-Capillary: 202 mg/dL — ABNORMAL HIGH (ref 65–99)

## 2015-02-16 DIAGNOSIS — E119 Type 2 diabetes mellitus without complications: Secondary | ICD-10-CM | POA: Diagnosis not present

## 2015-02-16 LAB — GLUCOSE, CAPILLARY
GLUCOSE-CAPILLARY: 116 mg/dL — AB (ref 65–99)
Glucose-Capillary: 97 mg/dL (ref 65–99)
Glucose-Capillary: 161 mg/dL — ABNORMAL HIGH (ref 65–99)
Glucose-Capillary: 112 mg/dL — ABNORMAL HIGH (ref 65–99)

## 2015-02-17 DIAGNOSIS — E119 Type 2 diabetes mellitus without complications: Secondary | ICD-10-CM | POA: Diagnosis not present

## 2015-02-18 LAB — GLUCOSE, CAPILLARY
GLUCOSE-CAPILLARY: 95 mg/dL (ref 65–99)
GLUCOSE-CAPILLARY: 109 mg/dL — AB (ref 65–99)
GLUCOSE-CAPILLARY: 116 mg/dL — AB (ref 65–99)
GLUCOSE-CAPILLARY: 108 mg/dL — AB (ref 65–99)
Glucose-Capillary: 199 mg/dL — ABNORMAL HIGH (ref 65–99)
Glucose-Capillary: 129 mg/dL — ABNORMAL HIGH (ref 65–99)
Glucose-Capillary: 245 mg/dL — ABNORMAL HIGH (ref 65–99)

## 2015-02-19 DIAGNOSIS — E119 Type 2 diabetes mellitus without complications: Secondary | ICD-10-CM | POA: Diagnosis not present

## 2015-02-19 LAB — GLUCOSE, CAPILLARY
GLUCOSE-CAPILLARY: 107 mg/dL — AB (ref 65–99)
Glucose-Capillary: 208 mg/dL — ABNORMAL HIGH (ref 65–99)
Glucose-Capillary: 108 mg/dL — ABNORMAL HIGH (ref 65–99)
Glucose-Capillary: 128 mg/dL — ABNORMAL HIGH (ref 65–99)

## 2015-02-20 DIAGNOSIS — M216X2 Other acquired deformities of left foot: Secondary | ICD-10-CM | POA: Diagnosis not present

## 2015-02-20 LAB — GLUCOSE, CAPILLARY: Glucose-Capillary: 106 mg/dL — ABNORMAL HIGH (ref 65–99)

## 2015-02-21 DIAGNOSIS — E119 Type 2 diabetes mellitus without complications: Secondary | ICD-10-CM | POA: Diagnosis not present

## 2015-02-21 LAB — GLUCOSE, CAPILLARY
GLUCOSE-CAPILLARY: 108 mg/dL — AB (ref 65–99)
GLUCOSE-CAPILLARY: 116 mg/dL — AB (ref 65–99)
GLUCOSE-CAPILLARY: 89 mg/dL (ref 65–99)
Glucose-Capillary: 111 mg/dL — ABNORMAL HIGH (ref 65–99)
Glucose-Capillary: 62 mg/dL — ABNORMAL LOW (ref 65–99)
Glucose-Capillary: 164 mg/dL — ABNORMAL HIGH (ref 65–99)
Glucose-Capillary: 114 mg/dL — ABNORMAL HIGH (ref 65–99)

## 2015-02-22 DIAGNOSIS — E119 Type 2 diabetes mellitus without complications: Secondary | ICD-10-CM | POA: Diagnosis not present

## 2015-02-22 LAB — GLUCOSE, CAPILLARY
GLUCOSE-CAPILLARY: 139 mg/dL — AB (ref 65–99)
Glucose-Capillary: 147 mg/dL — ABNORMAL HIGH (ref 65–99)

## 2015-02-23 DIAGNOSIS — E119 Type 2 diabetes mellitus without complications: Secondary | ICD-10-CM | POA: Diagnosis not present

## 2015-02-23 LAB — GLUCOSE, CAPILLARY
GLUCOSE-CAPILLARY: 76 mg/dL (ref 65–99)
GLUCOSE-CAPILLARY: 97 mg/dL (ref 65–99)
Glucose-Capillary: 125 mg/dL — ABNORMAL HIGH (ref 65–99)
Glucose-Capillary: 128 mg/dL — ABNORMAL HIGH (ref 65–99)

## 2015-02-24 LAB — GLUCOSE, CAPILLARY
GLUCOSE-CAPILLARY: 121 mg/dL — AB (ref 65–99)
GLUCOSE-CAPILLARY: 155 mg/dL — AB (ref 65–99)
GLUCOSE-CAPILLARY: 160 mg/dL — AB (ref 65–99)
GLUCOSE-CAPILLARY: 110 mg/dL — AB (ref 65–99)
GLUCOSE-CAPILLARY: 159 mg/dL — AB (ref 65–99)
Glucose-Capillary: 147 mg/dL — ABNORMAL HIGH (ref 65–99)
Glucose-Capillary: 139 mg/dL — ABNORMAL HIGH (ref 65–99)

## 2015-02-25 DIAGNOSIS — E119 Type 2 diabetes mellitus without complications: Secondary | ICD-10-CM | POA: Diagnosis not present

## 2015-02-25 LAB — GLUCOSE, CAPILLARY
GLUCOSE-CAPILLARY: 209 mg/dL — AB (ref 65–99)
Glucose-Capillary: 136 mg/dL — ABNORMAL HIGH (ref 65–99)

## 2015-02-26 DIAGNOSIS — E119 Type 2 diabetes mellitus without complications: Secondary | ICD-10-CM | POA: Diagnosis not present

## 2015-02-26 LAB — GLUCOSE, CAPILLARY
GLUCOSE-CAPILLARY: 88 mg/dL (ref 65–99)
GLUCOSE-CAPILLARY: 199 mg/dL — AB (ref 65–99)
GLUCOSE-CAPILLARY: 151 mg/dL — AB (ref 65–99)
GLUCOSE-CAPILLARY: 164 mg/dL — AB (ref 65–99)
Glucose-Capillary: 103 mg/dL — ABNORMAL HIGH (ref 65–99)
Glucose-Capillary: 123 mg/dL — ABNORMAL HIGH (ref 65–99)

## 2015-02-27 DIAGNOSIS — E119 Type 2 diabetes mellitus without complications: Secondary | ICD-10-CM | POA: Diagnosis not present

## 2015-02-27 LAB — GLUCOSE, CAPILLARY
GLUCOSE-CAPILLARY: 109 mg/dL — AB (ref 65–99)
GLUCOSE-CAPILLARY: 179 mg/dL — AB (ref 65–99)
Glucose-Capillary: 170 mg/dL — ABNORMAL HIGH (ref 65–99)

## 2015-02-28 DIAGNOSIS — G4700 Insomnia, unspecified: Secondary | ICD-10-CM | POA: Diagnosis not present

## 2015-03-01 LAB — GLUCOSE, CAPILLARY
GLUCOSE-CAPILLARY: 125 mg/dL — AB (ref 65–99)
GLUCOSE-CAPILLARY: 106 mg/dL — AB (ref 65–99)
GLUCOSE-CAPILLARY: 93 mg/dL (ref 65–99)
Glucose-Capillary: 110 mg/dL — ABNORMAL HIGH (ref 65–99)
Glucose-Capillary: 202 mg/dL — ABNORMAL HIGH (ref 65–99)

## 2015-03-02 DIAGNOSIS — E119 Type 2 diabetes mellitus without complications: Secondary | ICD-10-CM | POA: Diagnosis not present

## 2015-03-02 LAB — GLUCOSE, CAPILLARY
GLUCOSE-CAPILLARY: 131 mg/dL — AB (ref 65–99)
GLUCOSE-CAPILLARY: 101 mg/dL — AB (ref 65–99)
Glucose-Capillary: 109 mg/dL — ABNORMAL HIGH (ref 65–99)
Glucose-Capillary: 89 mg/dL (ref 65–99)
Glucose-Capillary: 125 mg/dL — ABNORMAL HIGH (ref 65–99)
Glucose-Capillary: 102 mg/dL — ABNORMAL HIGH (ref 65–99)

## 2015-03-03 DIAGNOSIS — E119 Type 2 diabetes mellitus without complications: Secondary | ICD-10-CM | POA: Diagnosis not present

## 2015-03-03 LAB — GLUCOSE, CAPILLARY
Glucose-Capillary: 97 mg/dL (ref 65–99)
Glucose-Capillary: 118 mg/dL — ABNORMAL HIGH (ref 65–99)
Glucose-Capillary: 120 mg/dL — ABNORMAL HIGH (ref 65–99)
Glucose-Capillary: 175 mg/dL — ABNORMAL HIGH (ref 65–99)
Glucose-Capillary: 103 mg/dL — ABNORMAL HIGH (ref 65–99)

## 2015-03-04 LAB — GLUCOSE, CAPILLARY: GLUCOSE-CAPILLARY: 200 mg/dL — AB (ref 65–99)

## 2015-03-05 DIAGNOSIS — E119 Type 2 diabetes mellitus without complications: Secondary | ICD-10-CM | POA: Diagnosis not present

## 2015-03-05 DIAGNOSIS — L97522 Non-pressure chronic ulcer of other part of left foot with fat layer exposed: Secondary | ICD-10-CM | POA: Diagnosis not present

## 2015-03-05 DIAGNOSIS — E1159 Type 2 diabetes mellitus with other circulatory complications: Secondary | ICD-10-CM | POA: Diagnosis not present

## 2015-03-05 LAB — GLUCOSE, CAPILLARY
GLUCOSE-CAPILLARY: 135 mg/dL — AB (ref 65–99)
GLUCOSE-CAPILLARY: 96 mg/dL (ref 65–99)
GLUCOSE-CAPILLARY: 165 mg/dL — AB (ref 65–99)
GLUCOSE-CAPILLARY: 84 mg/dL (ref 65–99)
Glucose-Capillary: 106 mg/dL — ABNORMAL HIGH (ref 65–99)
Glucose-Capillary: 102 mg/dL — ABNORMAL HIGH (ref 65–99)

## 2015-03-06 DIAGNOSIS — E119 Type 2 diabetes mellitus without complications: Secondary | ICD-10-CM | POA: Diagnosis not present

## 2015-03-06 LAB — GLUCOSE, CAPILLARY
GLUCOSE-CAPILLARY: 155 mg/dL — AB (ref 65–99)
GLUCOSE-CAPILLARY: 117 mg/dL — AB (ref 65–99)
GLUCOSE-CAPILLARY: 93 mg/dL (ref 65–99)

## 2015-03-07 ENCOUNTER — Encounter: Payer: Self-pay | Admitting: Ophthalmology

## 2015-03-07 LAB — GLUCOSE, CAPILLARY
GLUCOSE-CAPILLARY: 114 mg/dL — AB (ref 65–99)
GLUCOSE-CAPILLARY: 89 mg/dL (ref 65–99)
Glucose-Capillary: 196 mg/dL — ABNORMAL HIGH (ref 65–99)
Glucose-Capillary: 115 mg/dL — ABNORMAL HIGH (ref 65–99)
Glucose-Capillary: 144 mg/dL — ABNORMAL HIGH (ref 65–99)
Glucose-Capillary: 106 mg/dL — ABNORMAL HIGH (ref 65–99)

## 2015-03-08 DIAGNOSIS — E1140 Type 2 diabetes mellitus with diabetic neuropathy, unspecified: Secondary | ICD-10-CM | POA: Diagnosis not present

## 2015-03-08 DIAGNOSIS — E119 Type 2 diabetes mellitus without complications: Secondary | ICD-10-CM | POA: Diagnosis not present

## 2015-03-08 LAB — GLUCOSE, CAPILLARY
GLUCOSE-CAPILLARY: 95 mg/dL (ref 65–99)
GLUCOSE-CAPILLARY: 112 mg/dL — AB (ref 65–99)

## 2015-03-09 DIAGNOSIS — E119 Type 2 diabetes mellitus without complications: Secondary | ICD-10-CM | POA: Diagnosis not present

## 2015-03-09 LAB — GLUCOSE, CAPILLARY
GLUCOSE-CAPILLARY: 162 mg/dL — AB (ref 65–99)
GLUCOSE-CAPILLARY: 142 mg/dL — AB (ref 65–99)
GLUCOSE-CAPILLARY: 80 mg/dL (ref 65–99)
Glucose-Capillary: 162 mg/dL — ABNORMAL HIGH (ref 65–99)
Glucose-Capillary: 127 mg/dL — ABNORMAL HIGH (ref 65–99)
Glucose-Capillary: 83 mg/dL (ref 65–99)

## 2015-03-10 LAB — GLUCOSE, CAPILLARY
GLUCOSE-CAPILLARY: 114 mg/dL — AB (ref 65–99)
Glucose-Capillary: 220 mg/dL — ABNORMAL HIGH (ref 65–99)
Glucose-Capillary: 100 mg/dL — ABNORMAL HIGH (ref 65–99)

## 2015-03-15 DIAGNOSIS — I2510 Atherosclerotic heart disease of native coronary artery without angina pectoris: Secondary | ICD-10-CM | POA: Diagnosis not present

## 2015-03-15 DIAGNOSIS — I110 Hypertensive heart disease with heart failure: Secondary | ICD-10-CM | POA: Diagnosis not present

## 2015-03-15 DIAGNOSIS — I480 Paroxysmal atrial fibrillation: Secondary | ICD-10-CM | POA: Diagnosis not present

## 2015-03-15 DIAGNOSIS — Z96643 Presence of artificial hip joint, bilateral: Secondary | ICD-10-CM | POA: Diagnosis not present

## 2015-03-15 DIAGNOSIS — I5032 Chronic diastolic (congestive) heart failure: Secondary | ICD-10-CM | POA: Diagnosis not present

## 2015-03-15 DIAGNOSIS — E785 Hyperlipidemia, unspecified: Secondary | ICD-10-CM | POA: Diagnosis not present

## 2015-03-15 DIAGNOSIS — E1142 Type 2 diabetes mellitus with diabetic polyneuropathy: Secondary | ICD-10-CM | POA: Diagnosis not present

## 2015-03-15 DIAGNOSIS — L97429 Non-pressure chronic ulcer of left heel and midfoot with unspecified severity: Secondary | ICD-10-CM | POA: Diagnosis not present

## 2015-03-15 DIAGNOSIS — E039 Hypothyroidism, unspecified: Secondary | ICD-10-CM | POA: Diagnosis not present

## 2015-03-15 DIAGNOSIS — Z4789 Encounter for other orthopedic aftercare: Secondary | ICD-10-CM | POA: Diagnosis not present

## 2015-03-15 DIAGNOSIS — D649 Anemia, unspecified: Secondary | ICD-10-CM | POA: Diagnosis not present

## 2015-03-15 DIAGNOSIS — E11621 Type 2 diabetes mellitus with foot ulcer: Secondary | ICD-10-CM | POA: Diagnosis not present

## 2015-03-15 DIAGNOSIS — Z87891 Personal history of nicotine dependence: Secondary | ICD-10-CM | POA: Diagnosis not present

## 2015-03-19 DIAGNOSIS — E785 Hyperlipidemia, unspecified: Secondary | ICD-10-CM | POA: Diagnosis not present

## 2015-03-19 DIAGNOSIS — Z4789 Encounter for other orthopedic aftercare: Secondary | ICD-10-CM | POA: Diagnosis not present

## 2015-03-19 DIAGNOSIS — L97429 Non-pressure chronic ulcer of left heel and midfoot with unspecified severity: Secondary | ICD-10-CM | POA: Diagnosis not present

## 2015-03-19 DIAGNOSIS — I110 Hypertensive heart disease with heart failure: Secondary | ICD-10-CM | POA: Diagnosis not present

## 2015-03-19 DIAGNOSIS — I2510 Atherosclerotic heart disease of native coronary artery without angina pectoris: Secondary | ICD-10-CM | POA: Diagnosis not present

## 2015-03-19 DIAGNOSIS — Z96643 Presence of artificial hip joint, bilateral: Secondary | ICD-10-CM | POA: Diagnosis not present

## 2015-03-19 DIAGNOSIS — Z87891 Personal history of nicotine dependence: Secondary | ICD-10-CM | POA: Diagnosis not present

## 2015-03-19 DIAGNOSIS — E11621 Type 2 diabetes mellitus with foot ulcer: Secondary | ICD-10-CM | POA: Diagnosis not present

## 2015-03-19 DIAGNOSIS — I5032 Chronic diastolic (congestive) heart failure: Secondary | ICD-10-CM | POA: Diagnosis not present

## 2015-03-19 DIAGNOSIS — E1142 Type 2 diabetes mellitus with diabetic polyneuropathy: Secondary | ICD-10-CM | POA: Diagnosis not present

## 2015-03-19 DIAGNOSIS — E039 Hypothyroidism, unspecified: Secondary | ICD-10-CM | POA: Diagnosis not present

## 2015-03-19 DIAGNOSIS — I480 Paroxysmal atrial fibrillation: Secondary | ICD-10-CM | POA: Diagnosis not present

## 2015-03-19 DIAGNOSIS — D649 Anemia, unspecified: Secondary | ICD-10-CM | POA: Diagnosis not present

## 2015-03-26 DIAGNOSIS — E039 Hypothyroidism, unspecified: Secondary | ICD-10-CM | POA: Diagnosis not present

## 2015-03-26 DIAGNOSIS — Z96643 Presence of artificial hip joint, bilateral: Secondary | ICD-10-CM | POA: Diagnosis not present

## 2015-03-26 DIAGNOSIS — E11621 Type 2 diabetes mellitus with foot ulcer: Secondary | ICD-10-CM | POA: Diagnosis not present

## 2015-03-26 DIAGNOSIS — D649 Anemia, unspecified: Secondary | ICD-10-CM | POA: Diagnosis not present

## 2015-03-26 DIAGNOSIS — I480 Paroxysmal atrial fibrillation: Secondary | ICD-10-CM | POA: Diagnosis not present

## 2015-03-26 DIAGNOSIS — L97429 Non-pressure chronic ulcer of left heel and midfoot with unspecified severity: Secondary | ICD-10-CM | POA: Diagnosis not present

## 2015-03-26 DIAGNOSIS — I5032 Chronic diastolic (congestive) heart failure: Secondary | ICD-10-CM | POA: Diagnosis not present

## 2015-03-26 DIAGNOSIS — E1142 Type 2 diabetes mellitus with diabetic polyneuropathy: Secondary | ICD-10-CM | POA: Diagnosis not present

## 2015-03-26 DIAGNOSIS — E785 Hyperlipidemia, unspecified: Secondary | ICD-10-CM | POA: Diagnosis not present

## 2015-03-26 DIAGNOSIS — Z87891 Personal history of nicotine dependence: Secondary | ICD-10-CM | POA: Diagnosis not present

## 2015-03-26 DIAGNOSIS — I110 Hypertensive heart disease with heart failure: Secondary | ICD-10-CM | POA: Diagnosis not present

## 2015-03-26 DIAGNOSIS — I2510 Atherosclerotic heart disease of native coronary artery without angina pectoris: Secondary | ICD-10-CM | POA: Diagnosis not present

## 2015-03-26 DIAGNOSIS — Z4789 Encounter for other orthopedic aftercare: Secondary | ICD-10-CM | POA: Diagnosis not present

## 2015-04-02 DIAGNOSIS — I480 Paroxysmal atrial fibrillation: Secondary | ICD-10-CM | POA: Diagnosis not present

## 2015-04-02 DIAGNOSIS — E785 Hyperlipidemia, unspecified: Secondary | ICD-10-CM | POA: Diagnosis not present

## 2015-04-02 DIAGNOSIS — I5032 Chronic diastolic (congestive) heart failure: Secondary | ICD-10-CM | POA: Diagnosis not present

## 2015-04-02 DIAGNOSIS — D649 Anemia, unspecified: Secondary | ICD-10-CM | POA: Diagnosis not present

## 2015-04-02 DIAGNOSIS — Z96643 Presence of artificial hip joint, bilateral: Secondary | ICD-10-CM | POA: Diagnosis not present

## 2015-04-02 DIAGNOSIS — E039 Hypothyroidism, unspecified: Secondary | ICD-10-CM | POA: Diagnosis not present

## 2015-04-02 DIAGNOSIS — E1142 Type 2 diabetes mellitus with diabetic polyneuropathy: Secondary | ICD-10-CM | POA: Diagnosis not present

## 2015-04-02 DIAGNOSIS — I110 Hypertensive heart disease with heart failure: Secondary | ICD-10-CM | POA: Diagnosis not present

## 2015-04-02 DIAGNOSIS — E11621 Type 2 diabetes mellitus with foot ulcer: Secondary | ICD-10-CM | POA: Diagnosis not present

## 2015-04-02 DIAGNOSIS — I2510 Atherosclerotic heart disease of native coronary artery without angina pectoris: Secondary | ICD-10-CM | POA: Diagnosis not present

## 2015-04-02 DIAGNOSIS — Z87891 Personal history of nicotine dependence: Secondary | ICD-10-CM | POA: Diagnosis not present

## 2015-04-02 DIAGNOSIS — L97429 Non-pressure chronic ulcer of left heel and midfoot with unspecified severity: Secondary | ICD-10-CM | POA: Diagnosis not present

## 2015-04-02 DIAGNOSIS — Z4789 Encounter for other orthopedic aftercare: Secondary | ICD-10-CM | POA: Diagnosis not present

## 2015-04-09 DIAGNOSIS — Z96643 Presence of artificial hip joint, bilateral: Secondary | ICD-10-CM | POA: Diagnosis not present

## 2015-04-09 DIAGNOSIS — E785 Hyperlipidemia, unspecified: Secondary | ICD-10-CM | POA: Diagnosis not present

## 2015-04-09 DIAGNOSIS — D649 Anemia, unspecified: Secondary | ICD-10-CM | POA: Diagnosis not present

## 2015-04-09 DIAGNOSIS — I110 Hypertensive heart disease with heart failure: Secondary | ICD-10-CM | POA: Diagnosis not present

## 2015-04-09 DIAGNOSIS — E039 Hypothyroidism, unspecified: Secondary | ICD-10-CM | POA: Diagnosis not present

## 2015-04-09 DIAGNOSIS — E11621 Type 2 diabetes mellitus with foot ulcer: Secondary | ICD-10-CM | POA: Diagnosis not present

## 2015-04-09 DIAGNOSIS — Z4789 Encounter for other orthopedic aftercare: Secondary | ICD-10-CM | POA: Diagnosis not present

## 2015-04-09 DIAGNOSIS — E1142 Type 2 diabetes mellitus with diabetic polyneuropathy: Secondary | ICD-10-CM | POA: Diagnosis not present

## 2015-04-09 DIAGNOSIS — L97429 Non-pressure chronic ulcer of left heel and midfoot with unspecified severity: Secondary | ICD-10-CM | POA: Diagnosis not present

## 2015-04-09 DIAGNOSIS — Z87891 Personal history of nicotine dependence: Secondary | ICD-10-CM | POA: Diagnosis not present

## 2015-04-09 DIAGNOSIS — I480 Paroxysmal atrial fibrillation: Secondary | ICD-10-CM | POA: Diagnosis not present

## 2015-04-09 DIAGNOSIS — I2510 Atherosclerotic heart disease of native coronary artery without angina pectoris: Secondary | ICD-10-CM | POA: Diagnosis not present

## 2015-04-09 DIAGNOSIS — I5032 Chronic diastolic (congestive) heart failure: Secondary | ICD-10-CM | POA: Diagnosis not present

## 2015-04-11 DIAGNOSIS — G63 Polyneuropathy in diseases classified elsewhere: Secondary | ICD-10-CM | POA: Diagnosis not present

## 2015-04-11 DIAGNOSIS — E119 Type 2 diabetes mellitus without complications: Secondary | ICD-10-CM | POA: Diagnosis not present

## 2015-04-11 DIAGNOSIS — I482 Chronic atrial fibrillation: Secondary | ICD-10-CM | POA: Diagnosis not present

## 2015-04-11 DIAGNOSIS — E538 Deficiency of other specified B group vitamins: Secondary | ICD-10-CM | POA: Diagnosis not present

## 2015-04-17 DIAGNOSIS — E11621 Type 2 diabetes mellitus with foot ulcer: Secondary | ICD-10-CM | POA: Diagnosis not present

## 2015-04-17 DIAGNOSIS — Z4789 Encounter for other orthopedic aftercare: Secondary | ICD-10-CM | POA: Diagnosis not present

## 2015-04-17 DIAGNOSIS — E039 Hypothyroidism, unspecified: Secondary | ICD-10-CM | POA: Diagnosis not present

## 2015-04-17 DIAGNOSIS — I480 Paroxysmal atrial fibrillation: Secondary | ICD-10-CM | POA: Diagnosis not present

## 2015-04-17 DIAGNOSIS — E1142 Type 2 diabetes mellitus with diabetic polyneuropathy: Secondary | ICD-10-CM | POA: Diagnosis not present

## 2015-04-17 DIAGNOSIS — D649 Anemia, unspecified: Secondary | ICD-10-CM | POA: Diagnosis not present

## 2015-04-17 DIAGNOSIS — L97429 Non-pressure chronic ulcer of left heel and midfoot with unspecified severity: Secondary | ICD-10-CM | POA: Diagnosis not present

## 2015-04-17 DIAGNOSIS — I110 Hypertensive heart disease with heart failure: Secondary | ICD-10-CM | POA: Diagnosis not present

## 2015-04-17 DIAGNOSIS — I2510 Atherosclerotic heart disease of native coronary artery without angina pectoris: Secondary | ICD-10-CM | POA: Diagnosis not present

## 2015-04-17 DIAGNOSIS — Z96643 Presence of artificial hip joint, bilateral: Secondary | ICD-10-CM | POA: Diagnosis not present

## 2015-04-17 DIAGNOSIS — I5032 Chronic diastolic (congestive) heart failure: Secondary | ICD-10-CM | POA: Diagnosis not present

## 2015-04-17 DIAGNOSIS — Z87891 Personal history of nicotine dependence: Secondary | ICD-10-CM | POA: Diagnosis not present

## 2015-04-17 DIAGNOSIS — E785 Hyperlipidemia, unspecified: Secondary | ICD-10-CM | POA: Diagnosis not present

## 2015-04-23 DIAGNOSIS — L851 Acquired keratosis [keratoderma] palmaris et plantaris: Secondary | ICD-10-CM | POA: Diagnosis not present

## 2015-04-23 DIAGNOSIS — B351 Tinea unguium: Secondary | ICD-10-CM | POA: Diagnosis not present

## 2015-04-23 DIAGNOSIS — E1159 Type 2 diabetes mellitus with other circulatory complications: Secondary | ICD-10-CM | POA: Diagnosis not present

## 2015-04-24 DIAGNOSIS — I480 Paroxysmal atrial fibrillation: Secondary | ICD-10-CM | POA: Diagnosis not present

## 2015-04-24 DIAGNOSIS — E11621 Type 2 diabetes mellitus with foot ulcer: Secondary | ICD-10-CM | POA: Diagnosis not present

## 2015-04-24 DIAGNOSIS — E1142 Type 2 diabetes mellitus with diabetic polyneuropathy: Secondary | ICD-10-CM | POA: Diagnosis not present

## 2015-04-24 DIAGNOSIS — L97429 Non-pressure chronic ulcer of left heel and midfoot with unspecified severity: Secondary | ICD-10-CM | POA: Diagnosis not present

## 2015-04-24 DIAGNOSIS — I5032 Chronic diastolic (congestive) heart failure: Secondary | ICD-10-CM | POA: Diagnosis not present

## 2015-04-24 DIAGNOSIS — D649 Anemia, unspecified: Secondary | ICD-10-CM | POA: Diagnosis not present

## 2015-04-24 DIAGNOSIS — E039 Hypothyroidism, unspecified: Secondary | ICD-10-CM | POA: Diagnosis not present

## 2015-04-24 DIAGNOSIS — I110 Hypertensive heart disease with heart failure: Secondary | ICD-10-CM | POA: Diagnosis not present

## 2015-04-24 DIAGNOSIS — E785 Hyperlipidemia, unspecified: Secondary | ICD-10-CM | POA: Diagnosis not present

## 2015-04-24 DIAGNOSIS — I2510 Atherosclerotic heart disease of native coronary artery without angina pectoris: Secondary | ICD-10-CM | POA: Diagnosis not present

## 2015-04-24 DIAGNOSIS — Z96643 Presence of artificial hip joint, bilateral: Secondary | ICD-10-CM | POA: Diagnosis not present

## 2015-04-24 DIAGNOSIS — Z4789 Encounter for other orthopedic aftercare: Secondary | ICD-10-CM | POA: Diagnosis not present

## 2015-04-24 DIAGNOSIS — Z87891 Personal history of nicotine dependence: Secondary | ICD-10-CM | POA: Diagnosis not present

## 2015-04-27 DIAGNOSIS — G63 Polyneuropathy in diseases classified elsewhere: Secondary | ICD-10-CM | POA: Diagnosis not present

## 2015-04-27 DIAGNOSIS — Z96649 Presence of unspecified artificial hip joint: Secondary | ICD-10-CM | POA: Diagnosis not present

## 2015-04-27 DIAGNOSIS — R0902 Hypoxemia: Secondary | ICD-10-CM | POA: Diagnosis not present

## 2015-04-27 DIAGNOSIS — Z5189 Encounter for other specified aftercare: Secondary | ICD-10-CM | POA: Diagnosis not present

## 2015-04-27 DIAGNOSIS — Z471 Aftercare following joint replacement surgery: Secondary | ICD-10-CM | POA: Diagnosis not present

## 2015-04-30 DIAGNOSIS — Z96643 Presence of artificial hip joint, bilateral: Secondary | ICD-10-CM | POA: Diagnosis not present

## 2015-04-30 DIAGNOSIS — E785 Hyperlipidemia, unspecified: Secondary | ICD-10-CM | POA: Diagnosis not present

## 2015-04-30 DIAGNOSIS — I110 Hypertensive heart disease with heart failure: Secondary | ICD-10-CM | POA: Diagnosis not present

## 2015-04-30 DIAGNOSIS — L97429 Non-pressure chronic ulcer of left heel and midfoot with unspecified severity: Secondary | ICD-10-CM | POA: Diagnosis not present

## 2015-04-30 DIAGNOSIS — D649 Anemia, unspecified: Secondary | ICD-10-CM | POA: Diagnosis not present

## 2015-04-30 DIAGNOSIS — Z87891 Personal history of nicotine dependence: Secondary | ICD-10-CM | POA: Diagnosis not present

## 2015-04-30 DIAGNOSIS — E1142 Type 2 diabetes mellitus with diabetic polyneuropathy: Secondary | ICD-10-CM | POA: Diagnosis not present

## 2015-04-30 DIAGNOSIS — I480 Paroxysmal atrial fibrillation: Secondary | ICD-10-CM | POA: Diagnosis not present

## 2015-04-30 DIAGNOSIS — E039 Hypothyroidism, unspecified: Secondary | ICD-10-CM | POA: Diagnosis not present

## 2015-04-30 DIAGNOSIS — E11621 Type 2 diabetes mellitus with foot ulcer: Secondary | ICD-10-CM | POA: Diagnosis not present

## 2015-04-30 DIAGNOSIS — I5032 Chronic diastolic (congestive) heart failure: Secondary | ICD-10-CM | POA: Diagnosis not present

## 2015-04-30 DIAGNOSIS — Z4789 Encounter for other orthopedic aftercare: Secondary | ICD-10-CM | POA: Diagnosis not present

## 2015-04-30 DIAGNOSIS — I2510 Atherosclerotic heart disease of native coronary artery without angina pectoris: Secondary | ICD-10-CM | POA: Diagnosis not present

## 2015-06-05 DIAGNOSIS — E119 Type 2 diabetes mellitus without complications: Secondary | ICD-10-CM | POA: Diagnosis not present

## 2015-06-05 DIAGNOSIS — E538 Deficiency of other specified B group vitamins: Secondary | ICD-10-CM | POA: Diagnosis not present

## 2015-06-12 DIAGNOSIS — F3341 Major depressive disorder, recurrent, in partial remission: Secondary | ICD-10-CM | POA: Diagnosis not present

## 2015-06-12 DIAGNOSIS — E119 Type 2 diabetes mellitus without complications: Secondary | ICD-10-CM | POA: Diagnosis not present

## 2015-06-12 DIAGNOSIS — G63 Polyneuropathy in diseases classified elsewhere: Secondary | ICD-10-CM | POA: Diagnosis not present

## 2015-06-12 DIAGNOSIS — D649 Anemia, unspecified: Secondary | ICD-10-CM | POA: Diagnosis not present

## 2015-06-19 DIAGNOSIS — H353132 Nonexudative age-related macular degeneration, bilateral, intermediate dry stage: Secondary | ICD-10-CM | POA: Diagnosis not present

## 2015-06-19 DIAGNOSIS — H47011 Ischemic optic neuropathy, right eye: Secondary | ICD-10-CM | POA: Diagnosis not present

## 2015-06-20 DIAGNOSIS — M216X1 Other acquired deformities of right foot: Secondary | ICD-10-CM | POA: Diagnosis not present

## 2015-06-20 DIAGNOSIS — E1142 Type 2 diabetes mellitus with diabetic polyneuropathy: Secondary | ICD-10-CM | POA: Diagnosis not present

## 2015-06-26 DIAGNOSIS — H47012 Ischemic optic neuropathy, left eye: Secondary | ICD-10-CM | POA: Diagnosis not present

## 2015-07-02 DIAGNOSIS — G629 Polyneuropathy, unspecified: Secondary | ICD-10-CM | POA: Diagnosis not present

## 2015-07-06 ENCOUNTER — Observation Stay
Admission: EM | Admit: 2015-07-06 | Discharge: 2015-07-09 | Disposition: A | Discharge status: Home / Self Care | Payer: PPO | Attending: Internal Medicine | Admitting: Internal Medicine

## 2015-07-06 ENCOUNTER — Other Ambulatory Visit: Payer: Self-pay

## 2015-07-06 ENCOUNTER — Encounter: Payer: Self-pay | Admitting: Emergency Medicine

## 2015-07-06 DIAGNOSIS — Z961 Presence of intraocular lens: Secondary | ICD-10-CM | POA: Insufficient documentation

## 2015-07-06 DIAGNOSIS — E860 Dehydration: Secondary | ICD-10-CM | POA: Insufficient documentation

## 2015-07-06 DIAGNOSIS — D649 Anemia, unspecified: Secondary | ICD-10-CM | POA: Diagnosis not present

## 2015-07-06 DIAGNOSIS — Z9842 Cataract extraction status, left eye: Secondary | ICD-10-CM | POA: Insufficient documentation

## 2015-07-06 DIAGNOSIS — Z9862 Peripheral vascular angioplasty status: Secondary | ICD-10-CM | POA: Diagnosis not present

## 2015-07-06 DIAGNOSIS — Z96643 Presence of artificial hip joint, bilateral: Secondary | ICD-10-CM | POA: Insufficient documentation

## 2015-07-06 DIAGNOSIS — E871 Hypo-osmolality and hyponatremia: Principal | ICD-10-CM | POA: Insufficient documentation

## 2015-07-06 DIAGNOSIS — K859 Acute pancreatitis without necrosis or infection, unspecified: Secondary | ICD-10-CM | POA: Insufficient documentation

## 2015-07-06 DIAGNOSIS — Z8674 Personal history of sudden cardiac arrest: Secondary | ICD-10-CM | POA: Diagnosis not present

## 2015-07-06 DIAGNOSIS — Z87891 Personal history of nicotine dependence: Secondary | ICD-10-CM | POA: Insufficient documentation

## 2015-07-06 DIAGNOSIS — I482 Chronic atrial fibrillation: Secondary | ICD-10-CM | POA: Diagnosis not present

## 2015-07-06 DIAGNOSIS — Z8249 Family history of ischemic heart disease and other diseases of the circulatory system: Secondary | ICD-10-CM | POA: Diagnosis not present

## 2015-07-06 DIAGNOSIS — E039 Hypothyroidism, unspecified: Secondary | ICD-10-CM | POA: Diagnosis not present

## 2015-07-06 DIAGNOSIS — R112 Nausea with vomiting, unspecified: Secondary | ICD-10-CM

## 2015-07-06 DIAGNOSIS — Z7982 Long term (current) use of aspirin: Secondary | ICD-10-CM | POA: Diagnosis not present

## 2015-07-06 DIAGNOSIS — Z888 Allergy status to other drugs, medicaments and biological substances status: Secondary | ICD-10-CM | POA: Insufficient documentation

## 2015-07-06 DIAGNOSIS — Z9049 Acquired absence of other specified parts of digestive tract: Secondary | ICD-10-CM | POA: Insufficient documentation

## 2015-07-06 DIAGNOSIS — Z8673 Personal history of transient ischemic attack (TIA), and cerebral infarction without residual deficits: Secondary | ICD-10-CM | POA: Insufficient documentation

## 2015-07-06 DIAGNOSIS — Z885 Allergy status to narcotic agent status: Secondary | ICD-10-CM | POA: Insufficient documentation

## 2015-07-06 DIAGNOSIS — E785 Hyperlipidemia, unspecified: Secondary | ICD-10-CM | POA: Diagnosis not present

## 2015-07-06 DIAGNOSIS — Z79899 Other long term (current) drug therapy: Secondary | ICD-10-CM | POA: Insufficient documentation

## 2015-07-06 DIAGNOSIS — I110 Hypertensive heart disease with heart failure: Secondary | ICD-10-CM | POA: Insufficient documentation

## 2015-07-06 DIAGNOSIS — E1140 Type 2 diabetes mellitus with diabetic neuropathy, unspecified: Secondary | ICD-10-CM | POA: Insufficient documentation

## 2015-07-06 DIAGNOSIS — I5032 Chronic diastolic (congestive) heart failure: Secondary | ICD-10-CM

## 2015-07-06 LAB — COMPREHENSIVE METABOLIC PANEL
ALBUMIN: 4.3 g/dL (ref 3.5–5.0)
ALT: 20 U/L (ref 14–54)
AST: 34 U/L (ref 15–41)
Alkaline Phosphatase: 71 U/L (ref 38–126)
Anion gap: 12 (ref 5–15)
BILIRUBIN TOTAL: 0.3 mg/dL (ref 0.3–1.2)
BUN: 22 mg/dL — AB (ref 6–20)
CHLORIDE: 83 mmol/L — AB (ref 101–111)
CO2: 27 mmol/L (ref 22–32)
CREATININE: 1.13 mg/dL — AB (ref 0.44–1.00)
Calcium: 8.6 mg/dL — ABNORMAL LOW (ref 8.9–10.3)
GFR calc Af Amer: 52 mL/min — ABNORMAL LOW (ref >60)
GFR calc non Af Amer: 45 mL/min — ABNORMAL LOW (ref >60)
GLUCOSE: 195 mg/dL — AB (ref 65–99)
POTASSIUM: 4 mmol/L (ref 3.5–5.1)
Sodium: 122 mmol/L — ABNORMAL LOW (ref 135–145)
Total Protein: 6.5 g/dL (ref 6.5–8.1)

## 2015-07-06 LAB — CBC WITH DIFFERENTIAL/PLATELET
BASOS ABS: 0 10*3/uL (ref 0–0.1)
Eosinophils Absolute: 0 10*3/uL (ref 0–0.7)
Eosinophils Relative: 0 %
HEMATOCRIT: 34.1 % — AB (ref 35.0–47.0)
Hemoglobin: 11.6 g/dL — ABNORMAL LOW (ref 12.0–16.0)
Lymphs Abs: 0.8 10*3/uL — ABNORMAL LOW (ref 1.0–3.6)
MCH: 29.7 pg (ref 26.0–34.0)
MCHC: 34 g/dL (ref 32.0–36.0)
MCV: 87.4 fL (ref 80.0–100.0)
MONO ABS: 0.7 10*3/uL (ref 0.2–0.9)
Monocytes Relative: 7 %
NEUTROS ABS: 7.9 10*3/uL — AB (ref 1.4–6.5)
PLATELETS: 211 10*3/uL (ref 150–440)
RBC: 3.9 MIL/uL (ref 3.80–5.20)
RDW: 14.3 % (ref 11.5–14.5)
WBC: 9.5 10*3/uL (ref 3.6–11.0)

## 2015-07-06 LAB — LIPASE, BLOOD: Lipase: 73 U/L — ABNORMAL HIGH (ref 11–51)

## 2015-07-06 LAB — CARBAMAZEPINE LEVEL, TOTAL: Carbamazepine Lvl: 13.4 ug/mL — ABNORMAL HIGH (ref 4.0–12.0)

## 2015-07-06 LAB — TROPONIN I

## 2015-07-06 MED ORDER — SODIUM CHLORIDE 0.9 % IV BOLUS (SEPSIS)
1000.0000 mL | Freq: Once | INTRAVENOUS | Status: AC
  Administered 2015-07-06: 1000 mL via INTRAVENOUS

## 2015-07-06 MED ORDER — ONDANSETRON HCL 4 MG/2ML IJ SOLN
4.0000 mg | Freq: Once | INTRAMUSCULAR | Status: AC
  Administered 2015-07-06: 4 mg via INTRAVENOUS
  Filled 2015-07-06: qty 2

## 2015-07-06 NOTE — ED Notes (Addendum)
Patient denies pain or nausea at this time. Patient states she has taken 2 pills of her carbamazepine today.

## 2015-07-06 NOTE — ED Notes (Signed)
Pt has taken 8 1/2 tablets of her Tegretol which was filled on 6/19. Pt is prescribed 1/2 tablet twice a day. Pt states "my feet hurt sometimes" as why she takes more Tegretol. Son at bedside.

## 2015-07-06 NOTE — ED Provider Notes (Addendum)
[facility] Emergency Department Provider Note  ____________________________________________   I have reviewed the triage vital signs and the nursing notes.   HISTORY  Chief Complaint Nausea and Emesis    HPI Sharon [name] is a 82 y.o. female who presents today with vomiting for a few hours. She's vomited twice. She has no abdominal pain or diarrhea. This is a ride to the CVA. She is awake and alert and oriented but somewhat of a limited historian in terms this issue. She states she feels fine. Currently, there is some concern that her son feels that she is taking too much for carbamazepine, she does not believe that she is,. In any event, patient has had vomiting this evening and no other complaints. Nonbloody nonbilious. No fever no chills.     Past Medical History  Diagnosis Date  . CHF (congestive heart failure) ([place])   . Diabetes mellitus without complication ([place])   . Hyperlipidemia   . Atrial fibrillation ([place])   . Hypothyroid   . Hypertension   . Stroke ([place])   . Neuropathy ([place])   . Anemia   . Edema     feet/ankles occas  . Osteomyelitis ([place])     left first metatarsal  . Hip fracture ([place])   . Cardiac arrest [facility])     Patient Active Problem List   Diagnosis Date Noted  . Foot ulcer ([place]) 02/07/2015  . ARF (acute renal failure) ([place]) 11/05/2014  . Diabetic foot infection ([place]) 10/10/2014  . Chronic diastolic heart failure ([place]) 05/31/2014  . HTN (hypertension) 05/31/2014  . DM (diabetes mellitus) type 2, uncontrolled, with ketoacidosis ([place]) 05/31/2014    Past Surgical History  Procedure Laterality Date  . Hemiarthroplasty hip Right   . Hemiarthroplasty hip Left   . Cholecystectomy    . Appendectomy    . Cardiac catheterization  08/25/13  . Coronary angioplasty    . Incision and drainage Left 10/11/2014    Procedure: Removal of infected tibial sessmoid;  Surgeon: Albertine Patricia, DPM;  Location: [facility] ORS;  Service: Podiatry;   Laterality: Left;  . Hallux valgus akin Left 02/08/2015    Procedure: HALLUX VALGUS AKIN/ KELLER;  Surgeon: Albertine Patricia, DPM;  Location: [facility] ORS;  Service: Podiatry;  Laterality: Left;  IVA with Local needs 1 hour for this case   . Achilles tendon surgery Left 02/08/2015    Procedure: ACHILLES LENGTHENING/KIDNER;  Surgeon: Albertine Patricia, DPM;  Location: [facility] ORS;  Service: Podiatry;  Laterality: Left;  . Cataract extraction w/phaco Left 07/20/2014    Procedure: CATARACT EXTRACTION PHACO AND INTRAOCULAR LENS PLACEMENT (IOC);  Surgeon: Leandrew Koyanagi, MD;  Location: [facility] ORS;  Service: Ophthalmology;  Laterality: Left;  [place]  1:18                 AP     23.6             CDE   9.69      lot #[id number]    Current Outpatient Rx  Name  Route  Sig  Dispense  Refill  . acetaminophen (TYLENOL) 500 MG tablet   Oral   Take 500 mg by mouth every 4 (four) hours as needed for mild pain or moderate pain.         Marland Kitchen amiodarone (PACERONE) 200 MG tablet      Take one tablet by mouth once daily for AFIB         . aspirin 81 MG tablet   Oral  Take 81 mg by mouth daily.         . carbamazepine (TEGRETOL) 200 MG tablet   Oral   Take 100 mg by mouth 3 (three) times daily.         . cholecalciferol 2000 UNITS TABS   Oral   Take 1 tablet (2,000 Units total) by mouth daily.   1 tablet   1   . ferrous sulfate 325 (65 FE) MG tablet   Oral   Take 325 mg by mouth daily with breakfast.         . gabapentin (NEURONTIN) 600 MG tablet      Take one tablet by mouth twice daily for neuropathy         . GLIPIZIDE XL 2.5 MG 24 hr tablet   Oral   Take 2.5 mg by mouth daily. Reported on 02/07/2015      2     Dispense as written.   Marland Kitchen levothyroxine (SYNTHROID, LEVOTHROID) 112 MCG tablet      Take one tablet by mouth once daily 30 minutes before breakfast once daily for hypothyroid         . lisinopril (PRINIVIL,ZESTRIL) 10 MG tablet   Oral   Take 10 mg by mouth daily.         .  Multiple Vitamins-Minerals (PRESERVISION AREDS 2 PO)   Oral   Take 2 tablets by mouth daily. Reported on 02/07/2015         . simvastatin (ZOCOR) 40 MG tablet      Take one tablet by mouth once daily at bedtime for cholesterol         . pantoprazole (PROTONIX) 40 MG tablet   Oral   Take 1 tablet (40 mg total) by mouth daily. Patient not taking: Reported on 02/07/2015   1 tablet   1     Allergies Codeine; Cymbalta; Duloxetine; and Lyrica  Family History  Problem Relation Age of Onset  . Heart attack Mother     Social History Social History  Substance Use Topics  . Smoking status: Former Smoker -- 0.50 packs/day for 7 years    Types: Cigarettes    Quit date: 05/30/1989  . Smokeless tobacco: Never Used  . Alcohol Use: No    Review of Systems Constitutional: No fever/chills Eyes: No visual changes. ENT: No sore throat. No stiff neck no neck pain Cardiovascular: Denies chest pain. Respiratory: Denies shortness of breath. Gastrointestinal:   Positive vomiting.  No diarrhea.  No constipation. Genitourinary: Negative for dysuria. Musculoskeletal: Negative lower extremity swelling Skin: Negative for rash. Neurological: Negative for headaches, focal weakness or numbness. 10-point ROS otherwise negative.  ____________________________________________   PHYSICAL EXAM:  VITAL SIGNS: ED Triage Vitals  Enc Vitals Group     BP 07/06/15 2155 196/82 mmHg     Pulse Rate 07/06/15 2155 74     Resp 07/06/15 2155 17     Temp 07/06/15 2155 97.6 F (36.4 C)     Temp Source 07/06/15 2155 Oral     SpO2 07/06/15 2155 97 %     Weight 07/06/15 2155 129 lb (58.514 kg)     Height 07/06/15 2155 '5\' 5"'$  (1.651 m)     Head Cir --      Peak Flow --      Pain Score --      Pain Loc --      Pain Edu? --      Excl. in [place]? --  Constitutional: Alert and oriented. Well appearing and in no acute distress. Eyes: Conjunctivae are normal. PERRL. EOMI. Head: Atraumatic. Nose: No  congestion/rhinnorhea. Mouth/Throat: Mucous membranes are moist.  Oropharynx non-erythematous. Neck: No stridor.   Nontender with no meningismus Cardiovascular: Normal rate, regular rhythm. Grossly normal heart sounds.  Good peripheral circulation. Respiratory: Normal respiratory effort.  No retractions. Lungs CTAB. Abdominal: Soft and nontender. No distention. No guarding no rebound Back:  There is no focal tenderness or step off there is no midline tenderness there are no lesions noted. there is no CVA tenderness Musculoskeletal: No lower extremity tenderness. No joint effusions, no DVT signs strong distal pulses no edema Neurologic:  Normal speech and language. No gross focal neurologic deficits are appreciated.  Skin:  Skin is warm, dry and intact. No rash noted. Psychiatric: Mood and affect are normal. Speech and behavior are normal.  ____________________________________________   LABS (all labs ordered are listed, but only abnormal results are displayed)  Labs Reviewed  CARBAMAZEPINE LEVEL, TOTAL  TROPONIN I  CBC WITH DIFFERENTIAL/PLATELET  COMPREHENSIVE METABOLIC PANEL  LIPASE, BLOOD   ____________________________________________  EKG  I personally interpreted any EKGs ordered by me or triage Sinus rhythm rate 76 bpm, no acute ST elevation or depression, PR interval 221, normal axis, nonspecific ST changes, baseline artifact limits interpretation ____________________________________________  RADIOLOGY  I reviewed any imaging ordered by me or triage that were performed during my shift and, if possible, patient and/or family made aware of any abnormal findings. ____________________________________________   PROCEDURES  Procedure(s) performed: None  Critical Care performed: None  ____________________________________________   INITIAL IMPRESSION / ASSESSMENT AND PLAN / ED COURSE  Pertinent labs & imaging results that were available during my care of the patient  were reviewed by me and considered in my medical decision making (see chart for details).  Patient with nausea and vomiting today. No other complaints abdomen is benign. We will check a carbamazepine level, although low suspicion for ACS given lack of chest pain shows breath we will obtain troponin and check lipase, liver function tests and CBC and reassess per   ----------------------------------------- 11:20 PM on 07/06/2015 -----------------------------------------  Signed out to dr. Dahlia Client at the end of my shift. ____________________________________________   FINAL CLINICAL IMPRESSION(S) / ED DIAGNOSES  Final diagnoses:  None      This chart was dictated using voice recognition software.  Despite best efforts to proofread,  errors can occur which can change meaning.     Schuyler Amor, MD 07/06/15 [place], MD 07/06/15 [phone]

## 2015-07-06 NOTE — ED Notes (Signed)
Per [facility], patient comes from home with c/o nausea and vomiting. Patient recently rx carbamazepine (tegretol) '200mg'$  0.5 tab 3x daily. Per patients son, she has been taking "way more" than prescribed. Patient is unable to state if she has taken the medication with food. Patient is A&O x4. NSR on monitor. Patient denies pain.

## 2015-07-07 ENCOUNTER — Encounter: Payer: Self-pay | Admitting: Internal Medicine

## 2015-07-07 DIAGNOSIS — E860 Dehydration: Secondary | ICD-10-CM | POA: Diagnosis not present

## 2015-07-07 DIAGNOSIS — E871 Hypo-osmolality and hyponatremia: Secondary | ICD-10-CM | POA: Diagnosis present

## 2015-07-07 DIAGNOSIS — R112 Nausea with vomiting, unspecified: Secondary | ICD-10-CM | POA: Diagnosis not present

## 2015-07-07 DIAGNOSIS — I10 Essential (primary) hypertension: Secondary | ICD-10-CM | POA: Diagnosis not present

## 2015-07-07 LAB — CBC
HCT: 33.5 % — ABNORMAL LOW (ref 35.0–47.0)
Hemoglobin: 11.2 g/dL — ABNORMAL LOW (ref 12.0–16.0)
MCH: 29.4 pg (ref 26.0–34.0)
MCHC: 33.5 g/dL (ref 32.0–36.0)
MCV: 87.8 fL (ref 80.0–100.0)
PLATELETS: 208 10*3/uL (ref 150–440)
RBC: 3.82 MIL/uL (ref 3.80–5.20)
RDW: 14.3 % (ref 11.5–14.5)
WBC: 12.6 10*3/uL — AB (ref 3.6–11.0)

## 2015-07-07 LAB — BASIC METABOLIC PANEL
Anion gap: 10 (ref 5–15)
BUN: 15 mg/dL (ref 6–20)
CHLORIDE: 93 mmol/L — AB (ref 101–111)
CO2: 24 mmol/L (ref 22–32)
CREATININE: 0.84 mg/dL (ref 0.44–1.00)
Calcium: 8.2 mg/dL — ABNORMAL LOW (ref 8.9–10.3)
GFR calc non Af Amer: >60 mL/min (ref >60)
Glucose, Bld: 125 mg/dL — ABNORMAL HIGH (ref 65–99)
POTASSIUM: 4 mmol/L (ref 3.5–5.1)
Sodium: 127 mmol/L — ABNORMAL LOW (ref 135–145)

## 2015-07-07 LAB — CARBAMAZEPINE LEVEL, TOTAL: CARBAMAZEPINE LVL: 10.4 ug/mL (ref 4.0–12.0)

## 2015-07-07 LAB — LIPASE, BLOOD: LIPASE: 32 U/L (ref 11–51)

## 2015-07-07 LAB — GLUCOSE, CAPILLARY: GLUCOSE-CAPILLARY: 129 mg/dL — AB (ref 65–99)

## 2015-07-07 MED ORDER — INSULIN ASPART 100 UNIT/ML ~~LOC~~ SOLN
0.0000 [IU] | Freq: Three times a day (TID) | SUBCUTANEOUS | Status: DC
  Administered 2015-07-08 – 2015-07-09 (×2): 2 [IU] via SUBCUTANEOUS
  Administered 2015-07-09: 1 [IU] via SUBCUTANEOUS
  Filled 2015-07-07 (×2): qty 2
  Filled 2015-07-07: qty 1

## 2015-07-07 MED ORDER — MORPHINE SULFATE (PF) 2 MG/ML IV SOLN
2.0000 mg | INTRAVENOUS | Status: DC | PRN
  Administered 2015-07-07: 1 mg via INTRAVENOUS
  Administered 2015-07-08 (×3): 2 mg via INTRAVENOUS
  Filled 2015-07-07 (×4): qty 1

## 2015-07-07 MED ORDER — PREGABALIN 50 MG PO CAPS
50.0000 mg | ORAL_CAPSULE | Freq: Three times a day (TID) | ORAL | Status: DC
  Filled 2015-07-07: qty 1

## 2015-07-07 MED ORDER — PANTOPRAZOLE SODIUM 40 MG PO TBEC
40.0000 mg | DELAYED_RELEASE_TABLET | Freq: Every day | ORAL | Status: DC
Start: 2015-07-07 — End: 2015-07-09
  Administered 2015-07-07 – 2015-07-09 (×3): 40 mg via ORAL
  Filled 2015-07-07 (×3): qty 1

## 2015-07-07 MED ORDER — ENOXAPARIN SODIUM 40 MG/0.4ML ~~LOC~~ SOLN
40.0000 mg | SUBCUTANEOUS | Status: DC
  Administered 2015-07-07 – 2015-07-09 (×3): 40 mg via SUBCUTANEOUS
  Filled 2015-07-07 (×4): qty 0.4

## 2015-07-07 MED ORDER — VITAMIN D 1000 UNITS PO TABS
2000.0000 [IU] | ORAL_TABLET | Freq: Every day | ORAL | Status: DC
  Administered 2015-07-08 – 2015-07-09 (×2): 2000 [IU] via ORAL
  Filled 2015-07-07 (×3): qty 2

## 2015-07-07 MED ORDER — MORPHINE SULFATE (PF) 2 MG/ML IV SOLN
1.0000 mg | INTRAVENOUS | Status: DC | PRN
  Administered 2015-07-07 (×2): 1 mg via INTRAVENOUS
  Filled 2015-07-07 (×2): qty 1

## 2015-07-07 MED ORDER — PREGABALIN 50 MG PO CAPS
50.0000 mg | ORAL_CAPSULE | Freq: Three times a day (TID) | ORAL | Status: DC
  Administered 2015-07-07 – 2015-07-08 (×3): 50 mg via ORAL
  Filled 2015-07-07 (×2): qty 1

## 2015-07-07 MED ORDER — GLIPIZIDE ER 2.5 MG PO TB24
2.5000 mg | ORAL_TABLET | Freq: Every day | ORAL | Status: DC
  Administered 2015-07-07: 2.5 mg via ORAL
  Filled 2015-07-07: qty 1

## 2015-07-07 MED ORDER — SODIUM CHLORIDE 0.9 % IV BOLUS (SEPSIS)
1000.0000 mL | Freq: Once | INTRAVENOUS | Status: AC
Start: 2015-07-07 — End: 2015-07-07
  Administered 2015-07-07: 1000 mL via INTRAVENOUS

## 2015-07-07 MED ORDER — GABAPENTIN 300 MG PO CAPS
600.0000 mg | ORAL_CAPSULE | Freq: Two times a day (BID) | ORAL | Status: DC
  Administered 2015-07-07 – 2015-07-09 (×5): 600 mg via ORAL
  Filled 2015-07-07 (×5): qty 2

## 2015-07-07 MED ORDER — ONDANSETRON HCL 4 MG PO TABS: 4.0000 mg | ORAL_TABLET | Freq: Four times a day (QID) | ORAL | Status: DC | PRN

## 2015-07-07 MED ORDER — SENNOSIDES-DOCUSATE SODIUM 8.6-50 MG PO TABS
1.0000 | ORAL_TABLET | Freq: Every evening | ORAL | Status: DC | PRN
  Administered 2015-07-07 – 2015-07-09 (×2): 1 via ORAL
  Filled 2015-07-07 (×4): qty 1

## 2015-07-07 MED ORDER — AMIODARONE HCL 200 MG PO TABS
200.0000 mg | ORAL_TABLET | Freq: Every day | ORAL | Status: DC
  Administered 2015-07-07 – 2015-07-09 (×3): 200 mg via ORAL
  Filled 2015-07-07 (×3): qty 1

## 2015-07-07 MED ORDER — ACETAMINOPHEN 325 MG PO TABS
650.0000 mg | ORAL_TABLET | Freq: Four times a day (QID) | ORAL | Status: DC | PRN
  Administered 2015-07-07 (×2): 650 mg via ORAL
  Filled 2015-07-07 (×2): qty 2

## 2015-07-07 MED ORDER — LEVOTHYROXINE SODIUM 112 MCG PO TABS
112.0000 ug | ORAL_TABLET | Freq: Every day | ORAL | Status: DC
  Administered 2015-07-07 – 2015-07-09 (×3): 112 ug via ORAL
  Filled 2015-07-07 (×3): qty 1

## 2015-07-07 MED ORDER — MORPHINE SULFATE (PF) 2 MG/ML IV SOLN: 1.0000 mg | INTRAVENOUS | Status: DC | PRN

## 2015-07-07 MED ORDER — FERROUS SULFATE 325 (65 FE) MG PO TABS
325.0000 mg | ORAL_TABLET | Freq: Every day | ORAL | Status: DC
  Administered 2015-07-07 – 2015-07-09 (×3): 325 mg via ORAL
  Filled 2015-07-07 (×3): qty 1

## 2015-07-07 MED ORDER — ACETAMINOPHEN 650 MG RE SUPP: 650.0000 mg | Freq: Four times a day (QID) | RECTAL | Status: DC | PRN

## 2015-07-07 MED ORDER — ONDANSETRON HCL 4 MG/2ML IJ SOLN
4.0000 mg | Freq: Four times a day (QID) | INTRAMUSCULAR | Status: DC | PRN
  Filled 2015-07-07: qty 2

## 2015-07-07 MED ORDER — ASPIRIN EC 81 MG PO TBEC
81.0000 mg | DELAYED_RELEASE_TABLET | Freq: Every day | ORAL | Status: DC
Start: 2015-07-07 — End: 2015-07-09
  Administered 2015-07-07 – 2015-07-09 (×3): 81 mg via ORAL
  Filled 2015-07-07 (×3): qty 1

## 2015-07-07 MED ORDER — LISINOPRIL 10 MG PO TABS
10.0000 mg | ORAL_TABLET | Freq: Every day | ORAL | Status: DC
  Administered 2015-07-07 – 2015-07-09 (×3): 10 mg via ORAL
  Filled 2015-07-07 (×3): qty 1

## 2015-07-07 MED ORDER — SODIUM CHLORIDE 0.9 % IV SOLN: INTRAVENOUS | Status: DC

## 2015-07-07 MED ORDER — SODIUM CHLORIDE 0.9 % IV SOLN
INTRAVENOUS | Status: DC
  Administered 2015-07-07 – 2015-07-09 (×4): via INTRAVENOUS

## 2015-07-07 MED ORDER — ZOLPIDEM TARTRATE 5 MG PO TABS
5.0000 mg | ORAL_TABLET | Freq: Every evening | ORAL | Status: DC | PRN
  Administered 2015-07-07 – 2015-07-08 (×2): 5 mg via ORAL
  Filled 2015-07-07 (×2): qty 1

## 2015-07-07 MED ORDER — GABAPENTIN 600 MG PO TABS: 600.0000 mg | ORAL_TABLET | Freq: Every day | ORAL | Status: DC

## 2015-07-07 MED ORDER — SIMVASTATIN 40 MG PO TABS
40.0000 mg | ORAL_TABLET | Freq: Every day | ORAL | Status: DC
  Administered 2015-07-07 – 2015-07-08 (×2): 40 mg via ORAL
  Filled 2015-07-07 (×2): qty 1

## 2015-07-07 NOTE — Progress Notes (Signed)
Patient is in constant sharp foot pain and states none of the medicine is helping, is forgetful saying we have done nothing.  I have walked with patient and walker around the floor 6 times per her request. MD new orders for pain and sleep medication.

## 2015-07-07 NOTE — ED Provider Notes (Signed)
-----------------------------------------   1:42 AM on 07/07/2015 -----------------------------------------   Blood pressure 130/85, pulse 66, temperature 97.6 F (36.4 C), temperature source Oral, resp. rate 19, height '5\' 5"'$  (1.651 m), weight 129 lb (58.514 kg), SpO2 98 %.  Assuming care from Dr. Burlene Arnt.  In short, Sharon [name] is a 82 y.o. female with a chief complaint of Nausea and Emesis .  Refer to the original H&P for additional details.  The current plan of care is to follow up the results of the blood work and disposition the patient.   The patient's sodium level came back at 122. The patient also has some elevated lipase of 73 and a carbamazepine level of 13.4. Given the patient's history of vomiting I feel that she should be admitted to the hospital for further evaluation and treatment of her hyponatremia. The patient's carbamazepine level is elevated but not severely so. The patient will be admitted to the hospitalist service.  Loney Hering, MD 07/07/15 [phone]

## 2015-07-07 NOTE — Progress Notes (Signed)
Patient was resting quietly this am, until 1000. Requesting something for pain in her legs. Reviewed meds and allergies. Talked with patient about home meds and what she takes at home.  Asked patient if she would like some Tylenol.  "It doesn't work." Patient stated that she has a script for and gets from the pharmacy on [address].  [facility] and they stated that the last script they filled was for Percocet in March. Talked with patient again and she stated she takes something over the counter, but couldn't remember what it was.  Son called and patient gave permission for me to talk with him about care.  He stated that "she always says she's in pain and can't take anything other then the meds she was taking at home".  "She gets all crazy when you give her narcotics." Asked if he knew what she took at home to relieve pain. "She takes too much." Paged MD and notified him for the request for pain.  Patient still in pain and now crying. Asked again, if she would like the tylenol until something else was available. She agreed. Got patient up to the chair with SBA with walker to the chair, applied chair alarm for safety.  Patient called again, and needed something for pain "now."  "If I was at home, I could take something for pain."  Again, this RN inquired what she took for pain at home.  Asked about Gabapentin (home med), "No, that doesn't work either."  Patent was unable to state what she takes for pain, was able to state what meds, doses, and when she takes her home meds. MD saw patient and started a low dose Lyrica. Talked with MD about allergies and order was placed.  Patient still complaining about pain. Son called and updated him about MD orders, he was very insistent about not giving her Lyrica or narcotics.  Talked with him and stated the first dose was already given. Son was extremely unhappy about orders.  Told him that I would have MD call him.  Text MD and call son.  Patient called RN for some  crackers, went into room patient was eating lunch and talking on the phone with son.  Patient handed this RN the phone and asked if he just talked to me and wanted to know if the med was given before or after we talked.  Told him again, that med was given before we spoke. Told him and the physician was asked to call him , to answer any questions he might have.  After this RN was finished talking with him, patient stated, "It's not his body.  How does he know how much pain I'm having" and began to cry again.  Re-directed patient and she clammed down and finished lunch.  Called RN again, for some sugar for her coffee.  Once believered, Patient was sitting in chair without complaints. Shortly after, the patient's chair alarm went off.  Jeris Penta into room and patient stated she needed something for pain or "i'm going to have to get walking." Patient has been walking in the hall many times since.

## 2015-07-07 NOTE — Progress Notes (Signed)
D: Patient up in hallways multiple times and still complaining of pain in bilat feet. A: Notified MD. R: New orders to restart home dosing of Gabapentin '600mg'$ , BID, first dose now.

## 2015-07-07 NOTE — Progress Notes (Signed)
Patient is A&O, but forgetful. C/O nerve pain. Asks for meds even though they were given five minutes ago. States that "nothing works", even her home meds. Started Lyrica this shift. See NN. Bed alarm on for safety. NSL in place.

## 2015-07-07 NOTE — Progress Notes (Signed)
[place] [facility] [place] NAME: Sharon [name]    MR#:  [id number]  DATE OF BIRTH:  27-Nov-1933  SUBJECTIVE:  CHIEF COMPLAINT:   Chief Complaint  Patient presents with  . Nausea  . Emesis   No more vomiting.  c/o  B/l leg pain.  REVIEW OF SYSTEMS:  CONSTITUTIONAL: No fever, fatigue or weakness.  EYES: No blurred or double vision.  EARS, NOSE, AND THROAT: No tinnitus or ear pain.  RESPIRATORY: No cough, shortness of breath, wheezing or hemoptysis.  CARDIOVASCULAR: No chest pain, orthopnea, edema.  GASTROINTESTINAL: No nausea, vomiting, diarrhea or abdominal pain.  GENITOURINARY: No dysuria, hematuria.  ENDOCRINE: No polyuria, nocturia,  HEMATOLOGY: No anemia, easy bruising or bleeding SKIN: No rash or lesion. MUSCULOSKELETAL: No joint pain or arthritis. Leg and foot pain.  NEUROLOGIC: No tingling, numbness, weakness.  PSYCHIATRY: No anxiety or depression.   ROS  DRUG ALLERGIES:   Allergies  Allergen Reactions  . Codeine Other (See Comments)    Other reaction(s): Other (See Comments) Patient states she did not feel herself when she took it. unknown  . Cymbalta [Duloxetine Hcl] Other (See Comments)    Confusion, disorientation  . Duloxetine     Other reaction(s): Other (See Comments) Confusion, disorientation  . Lyrica [Pregabalin] Other (See Comments)    Patient and son states this makes the patient confused.    VITALS:  Blood pressure 174/59, pulse 65, temperature 98.1 F (36.7 C), temperature source Oral, resp. rate 19, height '5\' 5"'$  (1.651 m), weight 63.504 kg (140 lb), SpO2 98 %.  PHYSICAL EXAMINATION:  GENERAL:  82 y.o.-year-old patient lying in the bed with no acute distress.  EYES: Pupils equal, round, reactive to light and accommodation. No scleral icterus. Extraocular muscles intact.  HEENT: Head atraumatic, normocephalic. Oropharynx and nasopharynx clear.  NECK:  Supple, no jugular venous distention. No thyroid  enlargement, no tenderness.  LUNGS: Normal breath sounds bilaterally, no wheezing, rales,rhonchi or crepitation. No use of accessory muscles of respiration.  CARDIOVASCULAR: S1, S2 normal. No murmurs, rubs, or gallops.  ABDOMEN: Soft, nontender, nondistended. Bowel sounds present. No organomegaly or mass.  EXTREMITIES: No pedal edema, cyanosis, or clubbing. Distal pulses palpable on both feet. NEUROLOGIC: Cranial nerves II through XII are intact. Muscle strength 5/5 in all extremities. Sensation intact. Gait not checked.  PSYCHIATRIC: The patient is alert and oriented x 3.  SKIN: No obvious rash, lesion, or ulcer.   Physical Exam LABORATORY PANEL:   CBC  Recent Labs Lab 07/07/15 0500  WBC 12.6*  HGB 11.2*  HCT 33.5*  PLT 208   ------------------------------------------------------------------------------------------------------------------  Chemistries   Recent Labs Lab 07/06/15 2230 07/07/15 0500  NA 122* 127*  K 4.0 4.0  CL 83* 93*  CO2 27 24  GLUCOSE 195* 125*  BUN 22* 15  CREATININE 1.13* 0.84  CALCIUM 8.6* 8.2*  AST 34  --   ALT 20  --   ALKPHOS 71  --   BILITOT 0.3  --    ------------------------------------------------------------------------------------------------------------------  Cardiac Enzymes  Recent Labs Lab 07/06/15 2230  TROPONINI <0.03   ------------------------------------------------------------------------------------------------------------------  RADIOLOGY:  No results found.  ASSESSMENT AND PLAN:   Principal Problem:   Hyponatremia   * Hyponatremia    Secondary to Carbamazepine and vomiting.    IV NS, monitor tomorrow.  * Nausea and vomit   Improved, upgrade diet.  * Carbamazepine toxicity   Level was high.    This was prescribed for neuropathic pain.  As per son- Pt was taking extra doses as the pain was out of control.   Stop the medicine.  * Neuropathic pain   As per son- Pt could not tolerate lyrica or any  other meds,and also complains gabapentin also does not work.    Her PMD tried different meds.    Son insisted to continue gabapentin as with other meds- she have side effects and her pain is not controlled anyways.  * A fib   Rate controlled- amiodarone.  * hypertension   Lisinopril.   All the records are reviewed and case discussed with Care Management/Social Workerr. Management plans discussed with the patient, family and they are in agreement.  CODE STATUS: full  TOTAL TIME TAKING CARE OF THIS PATIENT: 35 minutes.     POSSIBLE D/C IN 1-2 DAYS, DEPENDING ON CLINICAL CONDITION.   Vaughan Basta M.D on 07/07/2015   Between 7am to 6pm - Pager - [phone]  After 6pm go to [email or web address] - password [email or web address] [place] [facility]  Office  [phone]  CC: Primary care physician; Rusty Aus, MD  Note: This dictation was prepared with Dragon dictation along with smaller phrase technology. Any transcriptional errors that result from this process are unintentional.

## 2015-07-07 NOTE — Progress Notes (Signed)
Notified MD of pain issues and statements by patient. New orders placed by MD.

## 2015-07-07 NOTE — H&P (Signed)
[facility] NAME: Sharon [name]    MR#:  [id number]  DATE OF BIRTH:  28-Oct-1933  DATE OF ADMISSION:  07/06/2015  PRIMARY CARE PHYSICIAN: Rusty Aus, MD   REQUESTING/REFERRING PHYSICIAN:   CHIEF COMPLAINT:   Chief Complaint  Patient presents with  . Nausea  . Emesis    HISTORY OF PRESENT ILLNESS: Sharon [name]  is a 82 y.o. female with a known history of Congestive heart failure, diabetes mellitus, hyperlipidemia, chronic atrial fibrillation, hypothyroidism, hypertension, CVA presented to the emergency room with nausea and vomiting. Nausea and vomiting started on Friday. Vomitus contained food and water. No history of recent travel or eating food outside. No sick contacts at home. Patient has some abdominal discomfort no history of any diarrhea. No complaints of any rectal bleed. Patient is generalized weakness. Workup in the emergency room showed low sodium level of 122 and her Tegretol level was elevated. No history of any seizures. Patient also had mildly elevated lipase. No fever or chills. No history of any chest pain. No complaints of any shortness of breath.  PAST MEDICAL HISTORY:   Past Medical History  Diagnosis Date  . CHF (congestive heart failure) ([place])   . Diabetes mellitus without complication ([place])   . Hyperlipidemia   . Atrial fibrillation ([place])   . Hypothyroid   . Hypertension   . Stroke ([place])   . Neuropathy ([place])   . Anemia   . Edema     feet/ankles occas  . Osteomyelitis ([place])     left first metatarsal  . Hip fracture ([place])   . Cardiac arrest [facility])     PAST SURGICAL HISTORY: Past Surgical History  Procedure Laterality Date  . Hemiarthroplasty hip Right   . Hemiarthroplasty hip Left   . Cholecystectomy    . Appendectomy    . Cardiac catheterization  08/25/13  . Coronary angioplasty    . Incision and drainage Left 10/11/2014    Procedure: Removal of infected tibial sessmoid;  Surgeon:  Albertine Patricia, DPM;  Location: [facility] ORS;  Service: Podiatry;  Laterality: Left;  . Hallux valgus akin Left 02/08/2015    Procedure: HALLUX VALGUS AKIN/ KELLER;  Surgeon: Albertine Patricia, DPM;  Location: [facility] ORS;  Service: Podiatry;  Laterality: Left;  IVA with Local needs 1 hour for this case   . Achilles tendon surgery Left 02/08/2015    Procedure: ACHILLES LENGTHENING/KIDNER;  Surgeon: Albertine Patricia, DPM;  Location: [facility] ORS;  Service: Podiatry;  Laterality: Left;  . Cataract extraction w/phaco Left 07/20/2014    Procedure: CATARACT EXTRACTION PHACO AND INTRAOCULAR LENS PLACEMENT (IOC);  Surgeon: Leandrew Koyanagi, MD;  Location: [facility] ORS;  Service: Ophthalmology;  Laterality: Left;  [place]  1:18                 AP     23.6             CDE   9.69      lot #[id number]    SOCIAL HISTORY:  Social History  Substance Use Topics  . Smoking status: Former Smoker -- 0.50 packs/day for 7 years    Types: Cigarettes    Quit date: 05/30/1989  . Smokeless tobacco: Never Used  . Alcohol Use: No    FAMILY HISTORY:  Family History  Problem Relation Age of Onset  . Heart attack Mother     DRUG ALLERGIES:  Allergies  Allergen Reactions  . Codeine Other (See Comments)  Other reaction(s): Other (See Comments) Patient states she did not feel herself when she took it. unknown  . Cymbalta [Duloxetine Hcl] Other (See Comments)    Confusion, disorientation  . Duloxetine     Other reaction(s): Other (See Comments) Confusion, disorientation  . Lyrica [Pregabalin] Other (See Comments)    Patient and son states this makes the patient confused.    REVIEW OF SYSTEMS:   CONSTITUTIONAL: No fever, has weakness.  EYES: No blurred or double vision.  EARS, NOSE, AND THROAT: No tinnitus or ear pain.  RESPIRATORY: No cough, shortness of breath, wheezing or hemoptysis.  CARDIOVASCULAR: No chest pain, orthopnea, edema.  GASTROINTESTINAL: Has nausea, vomiting, abdominal pain. No diarrhoea. GENITOURINARY:  No dysuria, hematuria.  ENDOCRINE: No polyuria, nocturia,  HEMATOLOGY: No anemia, easy bruising or bleeding SKIN: No rash or lesion. MUSCULOSKELETAL: No joint pain or arthritis.   NEUROLOGIC: No tingling, numbness, weakness.  PSYCHIATRY: No anxiety or depression.   MEDICATIONS AT HOME:  Prior to Admission medications   Medication Sig Start Date End Date Taking? Authorizing Provider  acetaminophen (TYLENOL) 500 MG tablet Take 500 mg by mouth every 4 (four) hours as needed for mild pain or moderate pain.   Yes Historical Provider, MD  amiodarone (PACERONE) 200 MG tablet Take one tablet by mouth once daily for AFIB   Yes Historical Provider, MD  aspirin 81 MG tablet Take 81 mg by mouth daily.   Yes Historical Provider, MD  carbamazepine (TEGRETOL) 200 MG tablet Take 100 mg by mouth 3 (three) times daily.   Yes Historical Provider, MD  cholecalciferol 2000 UNITS TABS Take 1 tablet (2,000 Units total) by mouth daily. 11/08/14  Yes Rusty Aus, MD  ferrous sulfate 325 (65 FE) MG tablet Take 325 mg by mouth daily with breakfast.   Yes Historical Provider, MD  gabapentin (NEURONTIN) 600 MG tablet Take one tablet by mouth twice daily for neuropathy   Yes Historical Provider, MD  GLIPIZIDE XL 2.5 MG 24 hr tablet Take 2.5 mg by mouth daily. Reported on 02/07/2015 10/14/14  Yes Historical Provider, MD  levothyroxine (SYNTHROID, LEVOTHROID) 112 MCG tablet Take one tablet by mouth once daily 30 minutes before breakfast once daily for hypothyroid   Yes Historical Provider, MD  lisinopril (PRINIVIL,ZESTRIL) 10 MG tablet Take 10 mg by mouth daily.   Yes Historical Provider, MD  Multiple Vitamins-Minerals (PRESERVISION AREDS 2 PO) Take 2 tablets by mouth daily. Reported on 02/07/2015   Yes Historical Provider, MD  simvastatin (ZOCOR) 40 MG tablet Take one tablet by mouth once daily at bedtime for cholesterol   Yes Historical Provider, MD  pantoprazole (PROTONIX) 40 MG tablet Take 1 tablet (40 mg total) by mouth  daily. Patient not taking: Reported on 02/07/2015 11/08/14   Rusty Aus, MD      PHYSICAL EXAMINATION:   VITAL SIGNS: Blood pressure 130/85, pulse 66, temperature 97.6 F (36.4 C), temperature source Oral, resp. rate 19, height '5\' 5"'$  (1.651 m), weight 58.514 kg (129 lb), SpO2 98 %.  GENERAL:  82 y.o.-year-old patient lying in the bed with no acute distress.  EYES: Pupils equal, round, reactive to light and accommodation. No scleral icterus. Extraocular muscles intact.  HEENT: Head atraumatic, normocephalic. Oropharynx dry and nasopharynx clear.  NECK:  Supple, no jugular venous distention. No thyroid enlargement, no tenderness.  LUNGS: Normal breath sounds bilaterally, no wheezing, rales,rhonchi or crepitation. No use of accessory muscles of respiration.  CARDIOVASCULAR: S1, S2 normal. No murmurs, rubs, or gallops.  ABDOMEN:  Soft, nontender, nondistended. Bowel sounds present. No organomegaly or mass.  EXTREMITIES: No pedal edema, cyanosis, or clubbing.  NEUROLOGIC: Cranial nerves II through XII are intact. Muscle strength 5/5 in all extremities. Sensation intact. Gait not checked.  PSYCHIATRIC: The patient is alert and oriented x 3.  SKIN: No obvious rash, lesion, or ulcer.   LABORATORY PANEL:   CBC  Recent Labs Lab 07/06/15 2230  WBC 9.5  HGB 11.6*  HCT 34.1*  PLT 211  MCV 87.4  MCH 29.7  MCHC 34.0  RDW 14.3  LYMPHSABS 0.8*  MONOABS 0.7  EOSABS 0.0  BASOSABS 0.0   ------------------------------------------------------------------------------------------------------------------  Chemistries   Recent Labs Lab 07/06/15 2230  NA 122*  K 4.0  CL 83*  CO2 27  GLUCOSE 195*  BUN 22*  CREATININE 1.13*  CALCIUM 8.6*  AST 34  ALT 20  ALKPHOS 71  BILITOT 0.3   ------------------------------------------------------------------------------------------------------------------ estimated creatinine clearance is 35.7 mL/min (by C-G formula based on Cr of  1.13). ------------------------------------------------------------------------------------------------------------------ No results for input(s): TSH, T4TOTAL, T3FREE, THYROIDAB in the last 72 hours.  Invalid input(s): FREET3   Coagulation profile No results for input(s): INR, PROTIME in the last 168 hours. ------------------------------------------------------------------------------------------------------------------- No results for input(s): DDIMER in the last 72 hours. -------------------------------------------------------------------------------------------------------------------  Cardiac Enzymes  Recent Labs Lab 07/06/15 2230  TROPONINI <0.03   ------------------------------------------------------------------------------------------------------------------ Invalid input(s): POCBNP  ---------------------------------------------------------------------------------------------------------------  Urinalysis    Component Value Date/Time   COLORURINE YELLOW* 11/05/2014 1142   COLORURINE Yellow 08/18/2013 0253   APPEARANCEUR CLEAR* 11/05/2014 1142   APPEARANCEUR Hazy 08/18/2013 0253   LABSPEC 1.013 11/05/2014 1142   LABSPEC 1.012 08/18/2013 0253   PHURINE 5.0 11/05/2014 1142   PHURINE 5.0 08/18/2013 0253   GLUCOSEU NEGATIVE 11/05/2014 1142   GLUCOSEU >=500 08/18/2013 0253   HGBUR NEGATIVE 11/05/2014 1142   HGBUR Negative 08/18/2013 0253   BILIRUBINUR NEGATIVE 11/05/2014 1142   BILIRUBINUR Negative 08/18/2013 0253   KETONESUR NEGATIVE 11/05/2014 1142   KETONESUR Negative 08/18/2013 0253   PROTEINUR NEGATIVE 11/05/2014 1142   PROTEINUR 100 mg/dL 08/18/2013 0253   NITRITE NEGATIVE 11/05/2014 1142   NITRITE Negative 08/18/2013 0253   LEUKOCYTESUR TRACE* 11/05/2014 1142   LEUKOCYTESUR Trace 08/18/2013 0253     RADIOLOGY: No results found.  EKG: Orders placed or performed during the hospital encounter of 07/06/15  . ED EKG  . ED EKG    IMPRESSION AND  PLAN: 82 year old elderly female patient with history of chronic atrial fibrillation, diabetes mellitus type 2, hypothyroidism, hypertension, hyperlipidemia, CVA presented to emergency room with nausea and vomiting. Admitting diagnosis 1. Hyponatremia 2. Intractable nausea vomiting 3. Mild pancreatitis 4. Dehydration Treatment plan 1. IV fluid hydration 2. Follow-up sodium level 3. Hold Tegretol as it is elevated 4. Clear liquid diet 5. Follow-up lipase level 6. Antiemetics.  All the records are reviewed and case discussed with ED provider. Management plans discussed with the patient, family and they are in agreement.  CODE STATUS:FULL Code Status History    Date Active Date Inactive Code Status Order ID Comments User Context   02/07/2015  4:05 PM 02/12/2015  7:21 PM Full Code [id number]  Dustin Flock, MD Inpatient   11/05/2014  3:51 PM 11/09/2014  3:36 PM Full Code [id number]  Gladstone Lighter, MD Inpatient   10/10/2014 11:12 AM 10/17/2014  5:50 PM Full Code [id number]  Epifanio Lesches, MD Inpatient    Advance Directive Documentation        Most Recent Value   Type of Advance Directive  Healthcare Power  of Attorney   Pre-existing out of facility DNR order (yellow form or pink MOST form)     "MOST" Form in Place?         TOTAL TIME TAKING CARE OF THIS PATIENT: 50 minutes.    Saundra Shelling M.D on 07/07/2015 at 3:11 AM  Between 7am to 6pm - Pager - [phone]  After 6pm go to [email or web address] - password [email or web address] [place]  Office  [phone]  CC: Primary care physician; Rusty Aus, MD

## 2015-07-07 NOTE — Progress Notes (Signed)
Patient attempted to get out of the chair without assistance to walk.  Patient is tearful and states " she just wishes someone would shoot her" as the pain is unbearable.  Walked around the nurses station 3 times and returned to chair.  During walking patient states she took ambien at night to sleep and she is fearful that she is not going to sleep tonight because of the pain, also she recalls taking hydrocodone in the past.

## 2015-07-07 NOTE — Progress Notes (Signed)
Pt requesting medication for pain, constipation. Dr Claria Dice notifed, order received

## 2015-07-08 DIAGNOSIS — I10 Essential (primary) hypertension: Secondary | ICD-10-CM | POA: Diagnosis not present

## 2015-07-08 DIAGNOSIS — R112 Nausea with vomiting, unspecified: Secondary | ICD-10-CM | POA: Diagnosis not present

## 2015-07-08 DIAGNOSIS — E860 Dehydration: Secondary | ICD-10-CM | POA: Diagnosis not present

## 2015-07-08 DIAGNOSIS — E871 Hypo-osmolality and hyponatremia: Secondary | ICD-10-CM | POA: Diagnosis not present

## 2015-07-08 LAB — BASIC METABOLIC PANEL
ANION GAP: 6 (ref 5–15)
BUN: 14 mg/dL (ref 6–20)
CALCIUM: 7.9 mg/dL — AB (ref 8.9–10.3)
CO2: 27 mmol/L (ref 22–32)
Chloride: 94 mmol/L — ABNORMAL LOW (ref 101–111)
Creatinine, Ser: 0.96 mg/dL (ref 0.44–1.00)
GFR calc Af Amer: >60 mL/min (ref >60)
GFR, EST NON AFRICAN AMERICAN: 54 mL/min — AB (ref >60)
GLUCOSE: 88 mg/dL (ref 65–99)
POTASSIUM: 3.6 mmol/L (ref 3.5–5.1)
SODIUM: 127 mmol/L — AB (ref 135–145)

## 2015-07-08 LAB — GLUCOSE, CAPILLARY
GLUCOSE-CAPILLARY: 105 mg/dL — AB (ref 65–99)
GLUCOSE-CAPILLARY: 164 mg/dL — AB (ref 65–99)
Glucose-Capillary: 156 mg/dL — ABNORMAL HIGH (ref 65–99)
Glucose-Capillary: 117 mg/dL — ABNORMAL HIGH (ref 65–99)

## 2015-07-08 LAB — MAGNESIUM: MAGNESIUM: 1.9 mg/dL (ref 1.7–2.4)

## 2015-07-08 NOTE — Care Management Obs Status (Signed)
[place] NOTIFICATION   Patient Details  Name: Sharon [name] MRN: [id number] Date of Birth: 02/23/1933   Medicare Observation Status Notification Given:  Yes    Ival Bible, RN 07/08/2015, 10:49 AM

## 2015-07-08 NOTE — Progress Notes (Signed)
[place] [facility] [place] NAME: Sharon [name]    MR#:  [id number]  DATE OF BIRTH:  1933/11/30  SUBJECTIVE:  CHIEF COMPLAINT:   Chief Complaint  Patient presents with  . Nausea  . Emesis   No more vomiting.  c/o  B/l leg pain.  tolerating diet well, Sodium level is low.  REVIEW OF SYSTEMS:  CONSTITUTIONAL: No fever, fatigue or weakness.  EYES: No blurred or double vision.  EARS, NOSE, AND THROAT: No tinnitus or ear pain.  RESPIRATORY: No cough, shortness of breath, wheezing or hemoptysis.  CARDIOVASCULAR: No chest pain, orthopnea, edema.  GASTROINTESTINAL: No nausea, vomiting, diarrhea or abdominal pain.  GENITOURINARY: No dysuria, hematuria.  ENDOCRINE: No polyuria, nocturia,  HEMATOLOGY: No anemia, easy bruising or bleeding SKIN: No rash or lesion. MUSCULOSKELETAL: No joint pain or arthritis. Leg and foot pain.  NEUROLOGIC: No tingling, numbness, weakness.  PSYCHIATRY: No anxiety or depression.   ROS  DRUG ALLERGIES:   Allergies  Allergen Reactions  . Codeine Other (See Comments)    Other reaction(s): Other (See Comments) Patient states she did not feel herself when she took it. unknown  . Cymbalta [Duloxetine Hcl] Other (See Comments)    Confusion, disorientation  . Duloxetine     Other reaction(s): Other (See Comments) Confusion, disorientation  . Lyrica [Pregabalin] Other (See Comments)    Patient and son states this makes the patient confused.    VITALS:  Blood pressure 124/69, pulse 64, temperature 98.2 F (36.8 C), temperature source Oral, resp. rate 14, height '5\' 5"'$  (1.651 m), weight 63.504 kg (140 lb), SpO2 99 %.  PHYSICAL EXAMINATION:  GENERAL:  82 y.o.-year-old patient lying in the bed with no acute distress.  EYES: Pupils equal, round, reactive to light and accommodation. No scleral icterus. Extraocular muscles intact.  HEENT: Head atraumatic, normocephalic. Oropharynx and nasopharynx clear.  NECK:  Supple, no  jugular venous distention. No thyroid enlargement, no tenderness.  LUNGS: Normal breath sounds bilaterally, no wheezing, rales,rhonchi or crepitation. No use of accessory muscles of respiration.  CARDIOVASCULAR: S1, S2 normal. No murmurs, rubs, or gallops.  ABDOMEN: Soft, nontender, nondistended. Bowel sounds present. No organomegaly or mass.  EXTREMITIES: No pedal edema, cyanosis, or clubbing. Distal pulses palpable on both feet. NEUROLOGIC: Cranial nerves II through XII are intact. Muscle strength 5/5 in all extremities. Sensation intact. Gait not checked.  PSYCHIATRIC: The patient is alert and oriented x 3.  SKIN: No obvious rash, lesion, or ulcer.   Physical Exam LABORATORY PANEL:   CBC  Recent Labs Lab 07/07/15 0500  WBC 12.6*  HGB 11.2*  HCT 33.5*  PLT 208   ------------------------------------------------------------------------------------------------------------------  Chemistries   Recent Labs Lab 07/06/15 2230  07/08/15 0429  NA 122*  < > 127*  K 4.0  < > 3.6  CL 83*  < > 94*  CO2 27  < > 27  GLUCOSE 195*  < > 88  BUN 22*  < > 14  CREATININE 1.13*  < > 0.96  CALCIUM 8.6*  < > 7.9*  MG  --   --  1.9  AST 34  --   --   ALT 20  --   --   ALKPHOS 71  --   --   BILITOT 0.3  --   --   < > = values in this interval not displayed. ------------------------------------------------------------------------------------------------------------------  Cardiac Enzymes  Recent Labs Lab 07/06/15 2230  TROPONINI <0.03   ------------------------------------------------------------------------------------------------------------------  RADIOLOGY:  No results found.  ASSESSMENT AND PLAN:   Principal Problem:   Hyponatremia   * Hyponatremia    Secondary to Carbamazepine and vomiting.    IV NS, monitor tomorrow.   Continue IV fluids, still low.  * Nausea and vomit   Improved, upgrade diet.   Tolerating regular diet now.  * Carbamazepine toxicity   Level was  high.    This was prescribed for neuropathic pain.    As per son- Pt was taking extra doses as the pain was out of control.   Stop the medicine.  * Neuropathic pain   As per son- Pt could not tolerate lyrica or any other meds,and also complains gabapentin also does not work.    Her PMD tried different meds.    Son insisted to continue gabapentin as with other meds- she have side effects and her pain is not controlled anyways.   Continue gabapentin.  * A fib   Rate controlled- amiodarone.  * hypertension   Lisinopril.   All the records are reviewed and case discussed with Care Management/Social Workerr. Management plans discussed with the patient, family and they are in agreement.  CODE STATUS: full  TOTAL TIME TAKING CARE OF THIS PATIENT: 35 minutes.   POSSIBLE D/C IN 1-2 DAYS, DEPENDING ON CLINICAL CONDITION.   Vaughan Basta M.D on 07/08/2015   Between 7am to 6pm - Pager - [phone]  After 6pm go to [email or web address] - password [email or web address] [facility] [facility]  Office  [phone]  CC: Primary care physician; Rusty Aus, MD  Note: This dictation was prepared with Dragon dictation along with smaller phrase technology. Any transcriptional errors that result from this process are unintentional.

## 2015-07-09 DIAGNOSIS — I10 Essential (primary) hypertension: Secondary | ICD-10-CM | POA: Diagnosis not present

## 2015-07-09 DIAGNOSIS — R112 Nausea with vomiting, unspecified: Secondary | ICD-10-CM | POA: Diagnosis not present

## 2015-07-09 DIAGNOSIS — E871 Hypo-osmolality and hyponatremia: Secondary | ICD-10-CM | POA: Diagnosis not present

## 2015-07-09 DIAGNOSIS — E860 Dehydration: Secondary | ICD-10-CM | POA: Diagnosis not present

## 2015-07-09 LAB — GLUCOSE, CAPILLARY
GLUCOSE-CAPILLARY: 130 mg/dL — AB (ref 65–99)
GLUCOSE-CAPILLARY: 159 mg/dL — AB (ref 65–99)

## 2015-07-09 LAB — BASIC METABOLIC PANEL
Anion gap: 6 (ref 5–15)
BUN: 15 mg/dL (ref 6–20)
CHLORIDE: 102 mmol/L (ref 101–111)
CO2: 25 mmol/L (ref 22–32)
CREATININE: 0.88 mg/dL (ref 0.44–1.00)
Calcium: 8.4 mg/dL — ABNORMAL LOW (ref 8.9–10.3)
GFR calc Af Amer: >60 mL/min (ref >60)
GFR calc non Af Amer: >60 mL/min (ref >60)
GLUCOSE: 119 mg/dL — AB (ref 65–99)
POTASSIUM: 4 mmol/L (ref 3.5–5.1)
SODIUM: 133 mmol/L — AB (ref 135–145)

## 2015-07-09 NOTE — Discharge Summary (Signed)
[facility] NAME: Sharon [name]    MR#:  [id number]  DATE OF BIRTH:  12-21-1933  DATE OF ADMISSION:  07/06/2015 ADMITTING PHYSICIAN: Saundra Shelling, MD  DATE OF DISCHARGE: 07/09/2015  PRIMARY CARE PHYSICIAN: Rusty Aus, MD    ADMISSION DIAGNOSIS:  Hyponatremia [E87.1] Non-intractable vomiting with nausea, vomiting of unspecified type [R11.2]  DISCHARGE DIAGNOSIS:  Principal Problem:   Hyponatremia   SECONDARY DIAGNOSIS:   Past Medical History  Diagnosis Date  . CHF (congestive heart failure) ([place])   . Diabetes mellitus without complication ([place])   . Hyperlipidemia   . Atrial fibrillation ([place])   . Hypothyroid   . Hypertension   . Stroke ([place])   . Neuropathy ([place])   . Anemia   . Edema     feet/ankles occas  . Osteomyelitis ([place])     left first metatarsal  . Hip fracture ([place])   . Cardiac arrest [facility])     HOSPITAL COURSE:   * Hyponatremia  Secondary to Carbamazepine and vomiting.  IV NS, monitor tomorrow.  Continue IV fluids, still low.  * Nausea and vomit  Improved, upgrade diet.  Tolerating regular diet now.  * Carbamazepine toxicity  Level was high.  This was prescribed for neuropathic pain.  As per son- Pt was taking extra doses as the pain was out of control.  Stop the medicine.  * Neuropathic pain  As per son- Pt could not tolerate lyrica or any other meds,and also complains gabapentin also does not work.  Her PMD tried different meds.  Son insisted to continue gabapentin as with other meds- she have side effects and her pain is not controlled anyways.  Continue gabapentin.   Now pain is under control.  * A fib  Rate controlled- amiodarone.  * hypertension  Lisinopril.  DISCHARGE CONDITIONS:   Stable.  CONSULTS OBTAINED:     DRUG ALLERGIES:   Allergies  Allergen Reactions  . Codeine Other (See Comments)    Other reaction(s): Other (See  Comments) Patient states she did not feel herself when she took it. unknown  . Cymbalta [Duloxetine Hcl] Other (See Comments)    Confusion, disorientation  . Duloxetine     Other reaction(s): Other (See Comments) Confusion, disorientation  . Lyrica [Pregabalin] Other (See Comments)    Patient and son states this makes the patient confused.    DISCHARGE MEDICATIONS:   Current Discharge Medication List    CONTINUE these medications which have NOT CHANGED   Details  acetaminophen (TYLENOL) 500 MG tablet Take 500 mg by mouth every 4 (four) hours as needed for mild pain or moderate pain.    amiodarone (PACERONE) 200 MG tablet Take one tablet by mouth once daily for AFIB    aspirin 81 MG tablet Take 81 mg by mouth daily.    cholecalciferol 2000 UNITS TABS Take 1 tablet (2,000 Units total) by mouth daily. Qty: 1 tablet, Refills: 1    ferrous sulfate 325 (65 FE) MG tablet Take 325 mg by mouth daily with breakfast.    gabapentin (NEURONTIN) 600 MG tablet Take one tablet by mouth twice daily for neuropathy    GLIPIZIDE XL 2.5 MG 24 hr tablet Take 2.5 mg by mouth daily. Reported on 02/07/2015 Refills: 2    levothyroxine (SYNTHROID, LEVOTHROID) 112 MCG tablet Take one tablet by mouth once daily 30 minutes before breakfast once daily for hypothyroid    lisinopril (PRINIVIL,ZESTRIL) 10 MG tablet Take 10  mg by mouth daily.    Multiple Vitamins-Minerals (PRESERVISION AREDS 2 PO) Take 2 tablets by mouth daily. Reported on 02/07/2015    simvastatin (ZOCOR) 40 MG tablet Take one tablet by mouth once daily at bedtime for cholesterol    pantoprazole (PROTONIX) 40 MG tablet Take 1 tablet (40 mg total) by mouth daily. Qty: 1 tablet, Refills: 1      STOP taking these medications     carbamazepine (TEGRETOL) 200 MG tablet          DISCHARGE INSTRUCTIONS:   Follow with PMD in 2 weeks.  If you experience worsening of your admission symptoms, develop shortness of breath, life threatening  emergency, suicidal or homicidal thoughts you must seek medical attention immediately by calling 911 or calling your MD immediately  if symptoms less severe.  You Must read complete instructions/literature along with all the possible adverse reactions/side effects for all the Medicines you take and that have been prescribed to you. Take any new Medicines after you have completely understood and accept all the possible adverse reactions/side effects.   Please note  You were cared for by a hospitalist during your hospital stay. If you have any questions about your discharge medications or the care you received while you were in the hospital after you are discharged, you can call the unit and asked to speak with the hospitalist on call if the hospitalist that took care of you is not available. Once you are discharged, your primary care physician will handle any further medical issues. Please note that NO REFILLS for any discharge medications will be authorized once you are discharged, as it is imperative that you return to your primary care physician (or establish a relationship with a primary care physician if you do not have one) for your aftercare needs so that they can reassess your need for medications and monitor your lab values.    Today   CHIEF COMPLAINT:   Chief Complaint  Patient presents with  . Nausea  . Emesis    HISTORY OF PRESENT ILLNESS:  Sharon [name]  is a 82 y.o. female with a known history of Congestive heart failure, diabetes mellitus, hyperlipidemia, chronic atrial fibrillation, hypothyroidism, hypertension, CVA presented to the emergency room with nausea and vomiting. Nausea and vomiting started on Friday. Vomitus contained food and water. No history of recent travel or eating food outside. No sick contacts at home. Patient has some abdominal discomfort no history of any diarrhea. No complaints of any rectal bleed. Patient is generalized weakness. Workup in the emergency  room showed low sodium level of 122 and her Tegretol level was elevated. No history of any seizures. Patient also had mildly elevated lipase. No fever or chills. No history of any chest pain. No complaints of any shortness of breath.   VITAL SIGNS:  Blood pressure 139/62, pulse 70, temperature 97.7 F (36.5 C), temperature source Oral, resp. rate 19, height '5\' 5"'$  (1.651 m), weight 63.504 kg (140 lb), SpO2 95 %.  I/O:   Intake/Output Summary (Last 24 hours) at 07/09/15 0912 Last data filed at 07/09/15 0518  Gross per 24 hour  Intake 3110.01 ml  Output      0 ml  Net 3110.01 ml    PHYSICAL EXAMINATION:   GENERAL: 82 y.o.-year-old patient lying in the bed with no acute distress.  EYES: Pupils equal, round, reactive to light and accommodation. No scleral icterus. Extraocular muscles intact.  HEENT: Head atraumatic, normocephalic. Oropharynx and nasopharynx clear.  NECK: Supple,  no jugular venous distention. No thyroid enlargement, no tenderness.  LUNGS: Normal breath sounds bilaterally, no wheezing, rales,rhonchi or crepitation. No use of accessory muscles of respiration.  CARDIOVASCULAR: S1, S2 normal. No murmurs, rubs, or gallops.  ABDOMEN: Soft, nontender, nondistended. Bowel sounds present. No organomegaly or mass.  EXTREMITIES: No pedal edema, cyanosis, or clubbing. Distal pulses palpable on both feet. NEUROLOGIC: Cranial nerves II through XII are intact. Muscle strength 5/5 in all extremities. Sensation intact. Gait not checked.  PSYCHIATRIC: The patient is alert and oriented x 3.  SKIN: No obvious rash, lesion, or ulcer.    DATA REVIEW:   CBC  Recent Labs Lab 07/07/15 0500  WBC 12.6*  HGB 11.2*  HCT 33.5*  PLT 208    Chemistries   Recent Labs Lab 07/06/15 2230  07/08/15 0429 07/09/15 0429  NA 122*  < > 127* 133*  K 4.0  < > 3.6 4.0  CL 83*  < > 94* 102  CO2 27  < > 27 25  GLUCOSE 195*  < > 88 119*  BUN 22*  < > 14 15  CREATININE 1.13*  < > 0.96  0.88  CALCIUM 8.6*  < > 7.9* 8.4*  MG  --   --  1.9  --   AST 34  --   --   --   ALT 20  --   --   --   ALKPHOS 71  --   --   --   BILITOT 0.3  --   --   --   < > = values in this interval not displayed.  Cardiac Enzymes  Recent Labs Lab 07/06/15 2230  TROPONINI <0.03    Microbiology Results  Results for orders placed or performed during the hospital encounter of 02/07/15  MRSA PCR Screening     Status: None   Collection Time: 02/08/15  4:40 PM  Result Value Ref Range Status   MRSA by PCR NEGATIVE NEGATIVE Final    Comment:        The GeneXpert MRSA Assay (FDA approved for NASAL specimens only), is one component of a comprehensive MRSA colonization surveillance program. It is not intended to diagnose MRSA infection nor to guide or monitor treatment for MRSA infections.     RADIOLOGY:  No results found.  EKG:   Orders placed or performed during the hospital encounter of 07/06/15  . ED EKG  . ED EKG      Management plans discussed with the patient, family and they are in agreement.  CODE STATUS:     Code Status Orders        Start     Ordered   07/07/15 0354  Full code   Continuous     07/07/15 0353    Code Status History    Date Active Date Inactive Code Status Order ID Comments User Context   02/07/2015  4:05 PM 02/12/2015  7:21 PM Full Code [id number]  Dustin Flock, MD Inpatient   11/05/2014  3:51 PM 11/09/2014  3:36 PM Full Code [id number]  Gladstone Lighter, MD Inpatient   10/10/2014 11:12 AM 10/17/2014  5:50 PM Full Code [id number]  Epifanio Lesches, MD Inpatient    Advance Directive Documentation        Most Recent Value   Type of Advance Directive  Healthcare Power of [place], Living will   Pre-existing out of facility DNR order (yellow form or pink MOST form)     "MOST" Form in Place?  TOTAL TIME TAKING CARE OF THIS PATIENT: 35 minutes.    Vaughan Basta M.D on 07/09/2015 at 9:12 AM  Between 7am to 6pm - Pager -  [phone]  After 6pm go to [email or web address] - password [email or web address] [facility]  Office  [phone]  CC: Primary care physician; Rusty Aus, MD   Note: This dictation was prepared with Dragon dictation along with smaller phrase technology. Any transcriptional errors that result from this process are unintentional.

## 2015-07-09 NOTE — Progress Notes (Signed)
Levada Dy from care management is setting up [facility] for patient, Son-Marshall updated that patient is okay to discharge and will be transported via personal sitter, pt pushed to visitor entrance via volunteer staff.

## 2015-07-09 NOTE — Progress Notes (Signed)
Pt is alert and oriented, ambulatory in room with stand by assist and rolling walker, good appetite, denies n/v, bm in am, pt is d/c to home, appt scheduled to f/u with Dr. Sabra Heck, no new rx, pt instructed to stop taking tegretol and to manage neuropathy pain with gabapentin. Sodium serum level stable. Pt d/c to home via personal sitter. Uneventful shift.

## 2015-07-23 DIAGNOSIS — E1142 Type 2 diabetes mellitus with diabetic polyneuropathy: Secondary | ICD-10-CM | POA: Diagnosis not present

## 2015-07-23 DIAGNOSIS — L97522 Non-pressure chronic ulcer of other part of left foot with fat layer exposed: Secondary | ICD-10-CM | POA: Diagnosis not present

## 2015-08-03 DIAGNOSIS — H47012 Ischemic optic neuropathy, left eye: Secondary | ICD-10-CM | POA: Diagnosis not present

## 2015-08-06 DIAGNOSIS — D649 Anemia, unspecified: Secondary | ICD-10-CM | POA: Diagnosis not present

## 2015-08-06 DIAGNOSIS — E119 Type 2 diabetes mellitus without complications: Secondary | ICD-10-CM | POA: Diagnosis not present

## 2015-08-13 DIAGNOSIS — F3341 Major depressive disorder, recurrent, in partial remission: Secondary | ICD-10-CM | POA: Diagnosis not present

## 2015-08-13 DIAGNOSIS — I482 Chronic atrial fibrillation: Secondary | ICD-10-CM | POA: Diagnosis not present

## 2015-08-13 DIAGNOSIS — E538 Deficiency of other specified B group vitamins: Secondary | ICD-10-CM | POA: Diagnosis not present

## 2015-08-13 DIAGNOSIS — E1151 Type 2 diabetes mellitus with diabetic peripheral angiopathy without gangrene: Secondary | ICD-10-CM | POA: Diagnosis not present

## 2015-09-12 DIAGNOSIS — G63 Polyneuropathy in diseases classified elsewhere: Secondary | ICD-10-CM | POA: Diagnosis not present

## 2015-09-12 DIAGNOSIS — E1151 Type 2 diabetes mellitus with diabetic peripheral angiopathy without gangrene: Secondary | ICD-10-CM | POA: Diagnosis not present

## 2015-09-12 DIAGNOSIS — I10 Essential (primary) hypertension: Secondary | ICD-10-CM | POA: Diagnosis not present

## 2015-09-24 DIAGNOSIS — B351 Tinea unguium: Secondary | ICD-10-CM | POA: Diagnosis not present

## 2015-09-24 DIAGNOSIS — L851 Acquired keratosis [keratoderma] palmaris et plantaris: Secondary | ICD-10-CM | POA: Diagnosis not present

## 2015-09-24 DIAGNOSIS — E1142 Type 2 diabetes mellitus with diabetic polyneuropathy: Secondary | ICD-10-CM | POA: Diagnosis not present

## 2015-10-08 DIAGNOSIS — G63 Polyneuropathy in diseases classified elsewhere: Secondary | ICD-10-CM | POA: Diagnosis not present

## 2015-10-08 DIAGNOSIS — Z23 Encounter for immunization: Secondary | ICD-10-CM | POA: Diagnosis not present

## 2015-10-08 DIAGNOSIS — M79662 Pain in left lower leg: Secondary | ICD-10-CM | POA: Diagnosis not present

## 2015-10-08 DIAGNOSIS — H6121 Impacted cerumen, right ear: Secondary | ICD-10-CM | POA: Diagnosis not present

## 2015-10-08 DIAGNOSIS — M79661 Pain in right lower leg: Secondary | ICD-10-CM | POA: Diagnosis not present

## 2015-11-12 DIAGNOSIS — E538 Deficiency of other specified B group vitamins: Secondary | ICD-10-CM | POA: Diagnosis not present

## 2015-11-12 DIAGNOSIS — E1151 Type 2 diabetes mellitus with diabetic peripheral angiopathy without gangrene: Secondary | ICD-10-CM | POA: Diagnosis not present

## 2015-11-19 DIAGNOSIS — E1151 Type 2 diabetes mellitus with diabetic peripheral angiopathy without gangrene: Secondary | ICD-10-CM | POA: Diagnosis not present

## 2015-11-19 DIAGNOSIS — Z0000 Encounter for general adult medical examination without abnormal findings: Secondary | ICD-10-CM | POA: Diagnosis not present

## 2015-12-25 DIAGNOSIS — G603 Idiopathic progressive neuropathy: Secondary | ICD-10-CM | POA: Diagnosis not present

## 2016-01-10 DIAGNOSIS — G603 Idiopathic progressive neuropathy: Secondary | ICD-10-CM | POA: Diagnosis not present

## 2016-01-28 DIAGNOSIS — L851 Acquired keratosis [keratoderma] palmaris et plantaris: Secondary | ICD-10-CM | POA: Diagnosis not present

## 2016-01-28 DIAGNOSIS — E1142 Type 2 diabetes mellitus with diabetic polyneuropathy: Secondary | ICD-10-CM | POA: Diagnosis not present

## 2016-01-28 DIAGNOSIS — B351 Tinea unguium: Secondary | ICD-10-CM | POA: Diagnosis not present

## 2016-01-28 DIAGNOSIS — L97522 Non-pressure chronic ulcer of other part of left foot with fat layer exposed: Secondary | ICD-10-CM | POA: Diagnosis not present

## 2016-02-05 DIAGNOSIS — E1142 Type 2 diabetes mellitus with diabetic polyneuropathy: Secondary | ICD-10-CM | POA: Diagnosis not present

## 2016-02-05 DIAGNOSIS — L97521 Non-pressure chronic ulcer of other part of left foot limited to breakdown of skin: Secondary | ICD-10-CM | POA: Diagnosis not present

## 2016-02-12 DIAGNOSIS — M205X1 Other deformities of toe(s) (acquired), right foot: Secondary | ICD-10-CM | POA: Diagnosis not present

## 2016-02-12 DIAGNOSIS — L97511 Non-pressure chronic ulcer of other part of right foot limited to breakdown of skin: Secondary | ICD-10-CM | POA: Diagnosis not present

## 2016-02-12 DIAGNOSIS — E1151 Type 2 diabetes mellitus with diabetic peripheral angiopathy without gangrene: Secondary | ICD-10-CM | POA: Diagnosis not present

## 2016-02-12 DIAGNOSIS — E1142 Type 2 diabetes mellitus with diabetic polyneuropathy: Secondary | ICD-10-CM | POA: Diagnosis not present

## 2016-02-19 DIAGNOSIS — E1142 Type 2 diabetes mellitus with diabetic polyneuropathy: Secondary | ICD-10-CM | POA: Diagnosis not present

## 2016-02-19 DIAGNOSIS — G603 Idiopathic progressive neuropathy: Secondary | ICD-10-CM | POA: Diagnosis not present

## 2016-02-19 DIAGNOSIS — E1151 Type 2 diabetes mellitus with diabetic peripheral angiopathy without gangrene: Secondary | ICD-10-CM | POA: Diagnosis not present

## 2016-02-19 DIAGNOSIS — D500 Iron deficiency anemia secondary to blood loss (chronic): Secondary | ICD-10-CM | POA: Diagnosis not present

## 2016-02-19 DIAGNOSIS — L97511 Non-pressure chronic ulcer of other part of right foot limited to breakdown of skin: Secondary | ICD-10-CM | POA: Diagnosis not present

## 2016-02-19 DIAGNOSIS — M205X1 Other deformities of toe(s) (acquired), right foot: Secondary | ICD-10-CM | POA: Diagnosis not present

## 2016-02-26 DIAGNOSIS — G603 Idiopathic progressive neuropathy: Secondary | ICD-10-CM | POA: Diagnosis not present

## 2016-03-05 DIAGNOSIS — L97521 Non-pressure chronic ulcer of other part of left foot limited to breakdown of skin: Secondary | ICD-10-CM | POA: Diagnosis not present

## 2016-03-05 DIAGNOSIS — L97511 Non-pressure chronic ulcer of other part of right foot limited to breakdown of skin: Secondary | ICD-10-CM | POA: Diagnosis not present

## 2016-03-05 DIAGNOSIS — L02611 Cutaneous abscess of right foot: Secondary | ICD-10-CM | POA: Diagnosis not present

## 2016-03-05 DIAGNOSIS — L03031 Cellulitis of right toe: Secondary | ICD-10-CM | POA: Diagnosis not present

## 2016-03-11 DIAGNOSIS — L97521 Non-pressure chronic ulcer of other part of left foot limited to breakdown of skin: Secondary | ICD-10-CM | POA: Diagnosis not present

## 2016-03-11 DIAGNOSIS — E538 Deficiency of other specified B group vitamins: Secondary | ICD-10-CM | POA: Diagnosis not present

## 2016-03-11 DIAGNOSIS — E1142 Type 2 diabetes mellitus with diabetic polyneuropathy: Secondary | ICD-10-CM | POA: Diagnosis not present

## 2016-03-11 DIAGNOSIS — E1151 Type 2 diabetes mellitus with diabetic peripheral angiopathy without gangrene: Secondary | ICD-10-CM | POA: Diagnosis not present

## 2016-03-11 DIAGNOSIS — G603 Idiopathic progressive neuropathy: Secondary | ICD-10-CM | POA: Diagnosis not present

## 2016-03-21 DIAGNOSIS — E1151 Type 2 diabetes mellitus with diabetic peripheral angiopathy without gangrene: Secondary | ICD-10-CM | POA: Diagnosis not present

## 2016-03-21 DIAGNOSIS — G63 Polyneuropathy in diseases classified elsewhere: Secondary | ICD-10-CM | POA: Diagnosis not present

## 2016-04-07 DIAGNOSIS — L02611 Cutaneous abscess of right foot: Secondary | ICD-10-CM | POA: Diagnosis not present

## 2016-04-07 DIAGNOSIS — E1142 Type 2 diabetes mellitus with diabetic polyneuropathy: Secondary | ICD-10-CM | POA: Diagnosis not present

## 2016-04-14 DIAGNOSIS — E1142 Type 2 diabetes mellitus with diabetic polyneuropathy: Secondary | ICD-10-CM | POA: Diagnosis not present

## 2016-04-14 DIAGNOSIS — L97511 Non-pressure chronic ulcer of other part of right foot limited to breakdown of skin: Secondary | ICD-10-CM | POA: Diagnosis not present

## 2016-04-14 DIAGNOSIS — L97521 Non-pressure chronic ulcer of other part of left foot limited to breakdown of skin: Secondary | ICD-10-CM | POA: Diagnosis not present

## 2016-04-28 DIAGNOSIS — E538 Deficiency of other specified B group vitamins: Secondary | ICD-10-CM | POA: Diagnosis not present

## 2016-04-28 DIAGNOSIS — E1151 Type 2 diabetes mellitus with diabetic peripheral angiopathy without gangrene: Secondary | ICD-10-CM | POA: Diagnosis not present

## 2016-05-05 DIAGNOSIS — E1151 Type 2 diabetes mellitus with diabetic peripheral angiopathy without gangrene: Secondary | ICD-10-CM | POA: Diagnosis not present

## 2016-05-05 DIAGNOSIS — E538 Deficiency of other specified B group vitamins: Secondary | ICD-10-CM | POA: Diagnosis not present

## 2016-05-05 DIAGNOSIS — D500 Iron deficiency anemia secondary to blood loss (chronic): Secondary | ICD-10-CM | POA: Diagnosis not present

## 2016-05-28 DIAGNOSIS — E538 Deficiency of other specified B group vitamins: Secondary | ICD-10-CM | POA: Diagnosis not present

## 2016-05-28 DIAGNOSIS — E1151 Type 2 diabetes mellitus with diabetic peripheral angiopathy without gangrene: Secondary | ICD-10-CM | POA: Diagnosis not present

## 2016-05-28 DIAGNOSIS — D500 Iron deficiency anemia secondary to blood loss (chronic): Secondary | ICD-10-CM | POA: Diagnosis not present

## 2016-06-05 DIAGNOSIS — E782 Mixed hyperlipidemia: Secondary | ICD-10-CM | POA: Diagnosis not present

## 2016-06-05 DIAGNOSIS — M542 Cervicalgia: Secondary | ICD-10-CM | POA: Diagnosis not present

## 2016-06-05 DIAGNOSIS — E1151 Type 2 diabetes mellitus with diabetic peripheral angiopathy without gangrene: Secondary | ICD-10-CM | POA: Diagnosis not present

## 2016-06-05 DIAGNOSIS — D500 Iron deficiency anemia secondary to blood loss (chronic): Secondary | ICD-10-CM | POA: Diagnosis not present

## 2016-06-17 DIAGNOSIS — E1142 Type 2 diabetes mellitus with diabetic polyneuropathy: Secondary | ICD-10-CM | POA: Diagnosis not present

## 2016-06-17 DIAGNOSIS — L97521 Non-pressure chronic ulcer of other part of left foot limited to breakdown of skin: Secondary | ICD-10-CM | POA: Diagnosis not present

## 2016-06-17 DIAGNOSIS — L851 Acquired keratosis [keratoderma] palmaris et plantaris: Secondary | ICD-10-CM | POA: Diagnosis not present

## 2016-06-17 DIAGNOSIS — B351 Tinea unguium: Secondary | ICD-10-CM | POA: Diagnosis not present

## 2016-06-23 DIAGNOSIS — E1142 Type 2 diabetes mellitus with diabetic polyneuropathy: Secondary | ICD-10-CM | POA: Diagnosis not present

## 2016-06-23 DIAGNOSIS — L97521 Non-pressure chronic ulcer of other part of left foot limited to breakdown of skin: Secondary | ICD-10-CM | POA: Diagnosis not present

## 2016-06-30 DIAGNOSIS — L97521 Non-pressure chronic ulcer of other part of left foot limited to breakdown of skin: Secondary | ICD-10-CM | POA: Diagnosis not present

## 2016-06-30 DIAGNOSIS — E1142 Type 2 diabetes mellitus with diabetic polyneuropathy: Secondary | ICD-10-CM | POA: Diagnosis not present

## 2016-07-07 DIAGNOSIS — G609 Hereditary and idiopathic neuropathy, unspecified: Secondary | ICD-10-CM | POA: Diagnosis not present

## 2016-07-14 DIAGNOSIS — L97521 Non-pressure chronic ulcer of other part of left foot limited to breakdown of skin: Secondary | ICD-10-CM | POA: Diagnosis not present

## 2016-07-14 DIAGNOSIS — E1142 Type 2 diabetes mellitus with diabetic polyneuropathy: Secondary | ICD-10-CM | POA: Diagnosis not present

## 2016-07-28 DIAGNOSIS — M79672 Pain in left foot: Secondary | ICD-10-CM | POA: Diagnosis not present

## 2016-07-28 DIAGNOSIS — E1142 Type 2 diabetes mellitus with diabetic polyneuropathy: Secondary | ICD-10-CM | POA: Diagnosis not present

## 2016-07-28 DIAGNOSIS — L97521 Non-pressure chronic ulcer of other part of left foot limited to breakdown of skin: Secondary | ICD-10-CM | POA: Diagnosis not present

## 2016-07-28 DIAGNOSIS — S93505A Unspecified sprain of left lesser toe(s), initial encounter: Secondary | ICD-10-CM | POA: Diagnosis not present

## 2016-08-01 DIAGNOSIS — G609 Hereditary and idiopathic neuropathy, unspecified: Secondary | ICD-10-CM | POA: Diagnosis not present

## 2016-08-04 DIAGNOSIS — L97521 Non-pressure chronic ulcer of other part of left foot limited to breakdown of skin: Secondary | ICD-10-CM | POA: Diagnosis not present

## 2016-08-04 DIAGNOSIS — M79672 Pain in left foot: Secondary | ICD-10-CM | POA: Diagnosis not present

## 2016-08-04 DIAGNOSIS — E1142 Type 2 diabetes mellitus with diabetic polyneuropathy: Secondary | ICD-10-CM | POA: Diagnosis not present

## 2016-08-11 DIAGNOSIS — L97521 Non-pressure chronic ulcer of other part of left foot limited to breakdown of skin: Secondary | ICD-10-CM | POA: Diagnosis not present

## 2016-08-11 DIAGNOSIS — G609 Hereditary and idiopathic neuropathy, unspecified: Secondary | ICD-10-CM | POA: Diagnosis not present

## 2016-08-11 DIAGNOSIS — G5792 Unspecified mononeuropathy of left lower limb: Secondary | ICD-10-CM | POA: Diagnosis not present

## 2016-08-11 DIAGNOSIS — G5791 Unspecified mononeuropathy of right lower limb: Secondary | ICD-10-CM | POA: Diagnosis not present

## 2016-08-11 DIAGNOSIS — E1142 Type 2 diabetes mellitus with diabetic polyneuropathy: Secondary | ICD-10-CM | POA: Diagnosis not present

## 2016-08-11 DIAGNOSIS — E1151 Type 2 diabetes mellitus with diabetic peripheral angiopathy without gangrene: Secondary | ICD-10-CM | POA: Diagnosis not present

## 2016-08-19 DIAGNOSIS — R58 Hemorrhage, not elsewhere classified: Secondary | ICD-10-CM | POA: Diagnosis not present

## 2016-08-26 DIAGNOSIS — G609 Hereditary and idiopathic neuropathy, unspecified: Secondary | ICD-10-CM | POA: Diagnosis not present

## 2016-08-26 DIAGNOSIS — M542 Cervicalgia: Secondary | ICD-10-CM | POA: Diagnosis not present

## 2016-09-01 DIAGNOSIS — E782 Mixed hyperlipidemia: Secondary | ICD-10-CM | POA: Diagnosis not present

## 2016-09-01 DIAGNOSIS — E1151 Type 2 diabetes mellitus with diabetic peripheral angiopathy without gangrene: Secondary | ICD-10-CM | POA: Diagnosis not present

## 2016-09-04 DIAGNOSIS — E875 Hyperkalemia: Secondary | ICD-10-CM | POA: Diagnosis not present

## 2016-09-08 DIAGNOSIS — Z0000 Encounter for general adult medical examination without abnormal findings: Secondary | ICD-10-CM | POA: Diagnosis not present

## 2016-09-08 DIAGNOSIS — I482 Chronic atrial fibrillation: Secondary | ICD-10-CM | POA: Diagnosis not present

## 2016-09-08 DIAGNOSIS — E1151 Type 2 diabetes mellitus with diabetic peripheral angiopathy without gangrene: Secondary | ICD-10-CM | POA: Diagnosis not present

## 2016-09-08 DIAGNOSIS — E871 Hypo-osmolality and hyponatremia: Secondary | ICD-10-CM | POA: Diagnosis not present

## 2016-09-17 DIAGNOSIS — H353132 Nonexudative age-related macular degeneration, bilateral, intermediate dry stage: Secondary | ICD-10-CM | POA: Diagnosis not present

## 2016-09-22 DIAGNOSIS — E538 Deficiency of other specified B group vitamins: Secondary | ICD-10-CM | POA: Diagnosis not present

## 2016-09-22 DIAGNOSIS — G6289 Other specified polyneuropathies: Secondary | ICD-10-CM | POA: Diagnosis not present

## 2016-09-30 DIAGNOSIS — J45902 Unspecified asthma with status asthmaticus: Secondary | ICD-10-CM | POA: Diagnosis not present

## 2016-09-30 DIAGNOSIS — J40 Bronchitis, not specified as acute or chronic: Secondary | ICD-10-CM | POA: Diagnosis not present

## 2016-10-21 DIAGNOSIS — E1142 Type 2 diabetes mellitus with diabetic polyneuropathy: Secondary | ICD-10-CM | POA: Diagnosis not present

## 2016-10-21 DIAGNOSIS — L97521 Non-pressure chronic ulcer of other part of left foot limited to breakdown of skin: Secondary | ICD-10-CM | POA: Diagnosis not present

## 2016-10-27 DIAGNOSIS — L97521 Non-pressure chronic ulcer of other part of left foot limited to breakdown of skin: Secondary | ICD-10-CM | POA: Diagnosis not present

## 2016-10-30 DIAGNOSIS — E1142 Type 2 diabetes mellitus with diabetic polyneuropathy: Secondary | ICD-10-CM | POA: Diagnosis not present

## 2016-10-30 DIAGNOSIS — L97521 Non-pressure chronic ulcer of other part of left foot limited to breakdown of skin: Secondary | ICD-10-CM | POA: Diagnosis not present

## 2016-11-10 DIAGNOSIS — M542 Cervicalgia: Secondary | ICD-10-CM | POA: Diagnosis not present

## 2016-11-10 DIAGNOSIS — G5701 Lesion of sciatic nerve, right lower limb: Secondary | ICD-10-CM | POA: Diagnosis not present

## 2016-11-12 DIAGNOSIS — L97522 Non-pressure chronic ulcer of other part of left foot with fat layer exposed: Secondary | ICD-10-CM | POA: Diagnosis not present

## 2016-11-26 DIAGNOSIS — L97522 Non-pressure chronic ulcer of other part of left foot with fat layer exposed: Secondary | ICD-10-CM | POA: Diagnosis not present

## 2016-12-12 DIAGNOSIS — I482 Chronic atrial fibrillation: Secondary | ICD-10-CM | POA: Diagnosis not present

## 2016-12-12 DIAGNOSIS — R600 Localized edema: Secondary | ICD-10-CM | POA: Diagnosis not present

## 2016-12-12 DIAGNOSIS — E871 Hypo-osmolality and hyponatremia: Secondary | ICD-10-CM | POA: Diagnosis not present

## 2016-12-12 DIAGNOSIS — I10 Essential (primary) hypertension: Secondary | ICD-10-CM | POA: Diagnosis not present

## 2017-01-01 DIAGNOSIS — E1151 Type 2 diabetes mellitus with diabetic peripheral angiopathy without gangrene: Secondary | ICD-10-CM | POA: Diagnosis not present

## 2017-01-09 DIAGNOSIS — E871 Hypo-osmolality and hyponatremia: Secondary | ICD-10-CM | POA: Diagnosis not present

## 2017-01-09 DIAGNOSIS — D500 Iron deficiency anemia secondary to blood loss (chronic): Secondary | ICD-10-CM | POA: Diagnosis not present

## 2017-01-09 DIAGNOSIS — I482 Chronic atrial fibrillation: Secondary | ICD-10-CM | POA: Diagnosis not present

## 2017-01-09 DIAGNOSIS — E538 Deficiency of other specified B group vitamins: Secondary | ICD-10-CM | POA: Diagnosis not present

## 2017-01-09 DIAGNOSIS — E039 Hypothyroidism, unspecified: Secondary | ICD-10-CM | POA: Diagnosis not present

## 2017-01-09 DIAGNOSIS — E1151 Type 2 diabetes mellitus with diabetic peripheral angiopathy without gangrene: Secondary | ICD-10-CM | POA: Diagnosis not present

## 2017-01-09 DIAGNOSIS — R0600 Dyspnea, unspecified: Secondary | ICD-10-CM | POA: Diagnosis not present

## 2017-01-09 DIAGNOSIS — D649 Anemia, unspecified: Secondary | ICD-10-CM | POA: Diagnosis not present

## 2017-01-15 DIAGNOSIS — E538 Deficiency of other specified B group vitamins: Secondary | ICD-10-CM | POA: Diagnosis not present

## 2017-01-15 DIAGNOSIS — R600 Localized edema: Secondary | ICD-10-CM | POA: Diagnosis not present

## 2017-01-21 ENCOUNTER — Other Ambulatory Visit: Payer: Self-pay | Admitting: Internal Medicine

## 2017-01-21 ENCOUNTER — Ambulatory Visit
Admission: RE | Admit: 2017-01-21 | Discharge: 2017-01-21 | Disposition: A | Discharge status: Home / Self Care | Payer: PPO | Source: Ambulatory Visit | Attending: Internal Medicine | Admitting: Internal Medicine

## 2017-01-21 DIAGNOSIS — J90 Pleural effusion, not elsewhere classified: Secondary | ICD-10-CM | POA: Insufficient documentation

## 2017-01-21 DIAGNOSIS — R634 Abnormal weight loss: Secondary | ICD-10-CM | POA: Diagnosis not present

## 2017-01-21 DIAGNOSIS — R609 Edema, unspecified: Secondary | ICD-10-CM

## 2017-01-21 DIAGNOSIS — I700 Atherosclerosis of aorta: Secondary | ICD-10-CM | POA: Insufficient documentation

## 2017-01-21 DIAGNOSIS — R590 Localized enlarged lymph nodes: Secondary | ICD-10-CM | POA: Insufficient documentation

## 2017-01-21 DIAGNOSIS — R1084 Generalized abdominal pain: Secondary | ICD-10-CM

## 2017-01-21 DIAGNOSIS — K869 Disease of pancreas, unspecified: Secondary | ICD-10-CM | POA: Insufficient documentation

## 2017-01-21 DIAGNOSIS — R600 Localized edema: Secondary | ICD-10-CM | POA: Diagnosis not present

## 2017-01-21 DIAGNOSIS — I517 Cardiomegaly: Secondary | ICD-10-CM | POA: Insufficient documentation

## 2017-01-21 DIAGNOSIS — R188 Other ascites: Secondary | ICD-10-CM | POA: Diagnosis not present

## 2017-01-21 DIAGNOSIS — I2510 Atherosclerotic heart disease of native coronary artery without angina pectoris: Secondary | ICD-10-CM | POA: Insufficient documentation

## 2017-01-21 LAB — POCT I-STAT CREATININE: CREATININE: 1 mg/dL (ref 0.44–1.00)

## 2017-01-21 MED ORDER — IOPAMIDOL (ISOVUE-300) INJECTION 61%
75.0000 mL | Freq: Once | INTRAVENOUS | Status: AC | PRN
  Administered 2017-01-21: 75 mL via INTRAVENOUS

## 2017-01-23 DIAGNOSIS — K862 Cyst of pancreas: Secondary | ICD-10-CM | POA: Diagnosis not present

## 2017-01-23 DIAGNOSIS — I714 Abdominal aortic aneurysm, without rupture: Secondary | ICD-10-CM | POA: Diagnosis not present

## 2017-01-27 ENCOUNTER — Encounter (INDEPENDENT_AMBULATORY_CARE_PROVIDER_SITE_OTHER): Payer: Medicare Other | Admitting: Vascular Surgery

## 2017-01-29 ENCOUNTER — Inpatient Hospital Stay: Payer: PPO | Attending: Hematology and Oncology | Admitting: Hematology and Oncology

## 2017-01-29 ENCOUNTER — Other Ambulatory Visit: Payer: Self-pay

## 2017-01-29 ENCOUNTER — Other Ambulatory Visit: Payer: Self-pay | Admitting: Hematology and Oncology

## 2017-01-29 ENCOUNTER — Encounter: Payer: Self-pay | Admitting: Hematology and Oncology

## 2017-01-29 ENCOUNTER — Inpatient Hospital Stay: Payer: PPO

## 2017-01-29 VITALS — BP 148/68 | HR 67 | Resp 16 | Ht 65.0 in | Wt 140.0 lb

## 2017-01-29 DIAGNOSIS — E538 Deficiency of other specified B group vitamins: Secondary | ICD-10-CM

## 2017-01-29 DIAGNOSIS — K8689 Other specified diseases of pancreas: Secondary | ICD-10-CM

## 2017-01-29 DIAGNOSIS — J811 Chronic pulmonary edema: Secondary | ICD-10-CM | POA: Insufficient documentation

## 2017-01-29 DIAGNOSIS — E119 Type 2 diabetes mellitus without complications: Secondary | ICD-10-CM

## 2017-01-29 DIAGNOSIS — D649 Anemia, unspecified: Secondary | ICD-10-CM

## 2017-01-29 DIAGNOSIS — I4891 Unspecified atrial fibrillation: Secondary | ICD-10-CM | POA: Diagnosis not present

## 2017-01-29 DIAGNOSIS — D509 Iron deficiency anemia, unspecified: Secondary | ICD-10-CM | POA: Insufficient documentation

## 2017-01-29 DIAGNOSIS — I509 Heart failure, unspecified: Secondary | ICD-10-CM | POA: Insufficient documentation

## 2017-01-29 DIAGNOSIS — K869 Disease of pancreas, unspecified: Secondary | ICD-10-CM

## 2017-01-29 DIAGNOSIS — R609 Edema, unspecified: Secondary | ICD-10-CM | POA: Insufficient documentation

## 2017-01-29 DIAGNOSIS — M868X7 Other osteomyelitis, ankle and foot: Secondary | ICD-10-CM | POA: Insufficient documentation

## 2017-01-29 DIAGNOSIS — I517 Cardiomegaly: Secondary | ICD-10-CM | POA: Diagnosis not present

## 2017-01-29 DIAGNOSIS — G629 Polyneuropathy, unspecified: Secondary | ICD-10-CM | POA: Diagnosis not present

## 2017-01-29 DIAGNOSIS — G2581 Restless legs syndrome: Secondary | ICD-10-CM | POA: Insufficient documentation

## 2017-01-29 DIAGNOSIS — F5089 Other specified eating disorder: Secondary | ICD-10-CM | POA: Insufficient documentation

## 2017-01-29 DIAGNOSIS — E039 Hypothyroidism, unspecified: Secondary | ICD-10-CM | POA: Diagnosis not present

## 2017-01-29 DIAGNOSIS — I252 Old myocardial infarction: Secondary | ICD-10-CM | POA: Insufficient documentation

## 2017-01-29 DIAGNOSIS — Z87891 Personal history of nicotine dependence: Secondary | ICD-10-CM | POA: Insufficient documentation

## 2017-01-29 DIAGNOSIS — Z8781 Personal history of (healed) traumatic fracture: Secondary | ICD-10-CM | POA: Insufficient documentation

## 2017-01-29 DIAGNOSIS — Z8673 Personal history of transient ischemic attack (TIA), and cerebral infarction without residual deficits: Secondary | ICD-10-CM | POA: Diagnosis not present

## 2017-01-29 DIAGNOSIS — R109 Unspecified abdominal pain: Secondary | ICD-10-CM | POA: Diagnosis not present

## 2017-01-29 DIAGNOSIS — Z7982 Long term (current) use of aspirin: Secondary | ICD-10-CM

## 2017-01-29 DIAGNOSIS — J90 Pleural effusion, not elsewhere classified: Secondary | ICD-10-CM

## 2017-01-29 DIAGNOSIS — R0602 Shortness of breath: Secondary | ICD-10-CM | POA: Diagnosis not present

## 2017-01-29 DIAGNOSIS — I700 Atherosclerosis of aorta: Secondary | ICD-10-CM | POA: Insufficient documentation

## 2017-01-29 DIAGNOSIS — Z79899 Other long term (current) drug therapy: Secondary | ICD-10-CM | POA: Diagnosis not present

## 2017-01-29 DIAGNOSIS — E785 Hyperlipidemia, unspecified: Secondary | ICD-10-CM | POA: Diagnosis not present

## 2017-01-29 DIAGNOSIS — Z7984 Long term (current) use of oral hypoglycemic drugs: Secondary | ICD-10-CM | POA: Insufficient documentation

## 2017-01-29 LAB — CBC WITH DIFFERENTIAL/PLATELET
Basophils Absolute: 0.1 10*3/uL (ref 0–0.1)
Basophils Relative: 1 %
Eosinophils Absolute: 0.2 10*3/uL (ref 0–0.7)
Eosinophils Relative: 2 %
HCT: 26.8 % — ABNORMAL LOW (ref 35.0–47.0)
Hemoglobin: 8.2 g/dL — ABNORMAL LOW (ref 12.0–16.0)
Lymphocytes Relative: 17 %
Lymphs Abs: 1.7 10*3/uL (ref 1.0–3.6)
MCH: 20.9 pg — ABNORMAL LOW (ref 26.0–34.0)
MCHC: 30.4 g/dL — ABNORMAL LOW (ref 32.0–36.0)
MCV: 68.7 fL — ABNORMAL LOW (ref 80.0–100.0)
Monocytes Absolute: 0.8 10*3/uL (ref 0.2–0.9)
Monocytes Relative: 8 %
Neutro Abs: 7 10*3/uL — ABNORMAL HIGH (ref 1.4–6.5)
Neutrophils Relative %: 72 %
Platelets: 467 10*3/uL — ABNORMAL HIGH (ref 150–440)
RBC: 3.9 MIL/uL (ref 3.80–5.20)
RDW: 19.1 % — ABNORMAL HIGH (ref 11.5–14.5)
WBC: 9.8 10*3/uL (ref 3.6–11.0)

## 2017-01-29 LAB — COMPREHENSIVE METABOLIC PANEL
ALT: 12 U/L — ABNORMAL LOW (ref 14–54)
AST: 29 U/L (ref 15–41)
Albumin: 4.2 g/dL (ref 3.5–5.0)
Alkaline Phosphatase: 93 U/L (ref 38–126)
Anion gap: 12 (ref 5–15)
BUN: 37 mg/dL — ABNORMAL HIGH (ref 6–20)
CO2: 25 mmol/L (ref 22–32)
Calcium: 9 mg/dL (ref 8.9–10.3)
Chloride: 92 mmol/L — ABNORMAL LOW (ref 101–111)
Creatinine, Ser: 1.25 mg/dL — ABNORMAL HIGH (ref 0.44–1.00)
GFR calc Af Amer: 45 mL/min — ABNORMAL LOW (ref >60)
GFR calc non Af Amer: 39 mL/min — ABNORMAL LOW (ref >60)
Glucose, Bld: 152 mg/dL — ABNORMAL HIGH (ref 65–99)
Potassium: 4.6 mmol/L (ref 3.5–5.1)
Sodium: 129 mmol/L — ABNORMAL LOW (ref 135–145)
Total Bilirubin: 0.5 mg/dL (ref 0.3–1.2)
Total Protein: 7.9 g/dL (ref 6.5–8.1)

## 2017-01-29 LAB — IRON AND TIBC
Iron: 9 ug/dL — ABNORMAL LOW (ref 28–170)
Saturation Ratios: 2 % — ABNORMAL LOW (ref 10.4–31.8)
TIBC: 579 ug/dL — ABNORMAL HIGH (ref 250–450)
UIBC: 570 ug/dL

## 2017-01-29 LAB — FERRITIN: Ferritin: 22 ng/mL (ref 11–307)

## 2017-01-29 LAB — SEDIMENTATION RATE: Sed Rate: 68 mm/hr — ABNORMAL HIGH (ref 0–30)

## 2017-01-29 MED ORDER — LORAZEPAM 0.5 MG PO TABS: ORAL_TABLET | ORAL | 0 refills | Status: DC

## 2017-01-29 NOTE — Progress Notes (Signed)
See progress notes

## 2017-01-29 NOTE — Progress Notes (Signed)
Met with Sharon [name] and her son Sharon [name] during and after consult with Dr. Mike Gip. Introduced Therapist, nutritional and provided contact information for any future questions/needs. Escorted to scheduling. Lab notified for needed labs today. Oncology Nurse Navigator Documentation  Navigator Location: [facility]Med Onc (01/29/17 1600)   )Navigator Encounter Type: Initial MedOnc (01/29/17 1600)                     Patient Visit Type: MedOnc;Initial (01/29/17 1600) Treatment Phase: Abnormal Scans (01/29/17 1600) Barriers/Navigation Needs: No barriers at this time (01/29/17 1600)                          Time Spent with Patient: 45 (01/29/17 1600)

## 2017-01-29 NOTE — Progress Notes (Addendum)
[facility] day:  01/29/2017  Chief Complaint: Sharon [name] is a 84 y.o. female with pancreatic cystic lesion who is referred by Dr. Emily [name] in consultation for assessment and management.  HPI:   The patient has had anemia on and off for years.  She has never had a transfusion or required treatment.  She has been on ferrous sulfate 1 pill a day for "years".  Her diet is "good". She eats meat 3-4 times per week. Patient eats vegetables on a daily basis. Patient has had ice pica "all of my life". Patient has intermittent restless legs symptoms. Patient has edema (R>L) in her bilateral lower extremities.  CBC on 01/01/2017 revealed a hematocrit of 27.4, hemoglobin 7.9, MCV 78.1, platelets 335,0000, WBC 7000, and ANC 4910.  Labs on 01/09/2017 revealed a  retic of 3.08%.  TSH was 1.443.  Ferritin was 30.  Folate was 6.3.  B12 was 230 (low).  Sodium was 131.  BUN was 38 with a Cr of 1.2.  AST was 18, ALT 12, alkaline phosphatase 61, albumen 3.6, and bilirubin 0.3 on 01/09/2017.  SPEP was negative on 05/28/2016.  She was seen by Dr. Emily [name] on 01/21/2017.  She noted progressive lower extremity edema (right > left) minimally responsive to [employer] and diuretic therapy.  She was noted to be anemic and more short of breath.  She denied any orthopnea or PND.  Exam revealed 4+ right lower extremity edema and 2+ left lower extremity edema.  She continued Lasix 20 mg a day.  Zaroxolyn 2.5 mg QOD was added.  Abdominal and pelvic CT scan were ordered as well as right lower extremity duplex.  Abdomen and pelvic CT on 01/21/2017 revealed a multilocular cystic lesion in the body of the pancreas with associated ductal dilatation throughout the tail of the pancreas where there was extensive side branch ectasia and numerous other smaller cystic lesions. Findings were concerning for potential primary pancreatic malignancy such as a pancreatic adenocarcinoma or  potentially a large intraductal papillary mucinous neoplasm. Further evaluation with MRI of the abdomen with and without IV gadolinium with MRCP was recommended when the patient is able to adequately hold her breath during the examination.  There was mildly enlarged hepatoduodenal ligament lymph node, which was nonspecific but concerning for potential nodal metastasis.  There was mild cardiomegaly with evidence of mild interstitial pulmonary edema and small bilateral pleural effusions, which may suggest underlying congestive heart failure.  There was aortic atherosclerosis, in addition to at least right coronary artery disease. In addition, there was severe atherosclerosis of the immediate infrarenal abdominal aorta which demonstrated luminal narrowing to only 6 mm.  Right lower extremity duplex on 01/21/2017 revealed no evidence of DVT.  She describes a "cardiac event" in the past.  Her son denies having to be resuscitated, however notes that it was a "cardiac arrest". Patient has seen cardiology in the past, however does not recall the provider's name.  She has neuropathy in her feet. She takes Gabapentin 600 mg BID.   Symptomatically, patient denies acute concerns today. She denies chest pain, increased shortness of breath, or palpitation.  She maintains a sedentary lifestyle other than an occasional trip out to eat. Patient denies orthopnea. She has been experiencing some non-specific abdominal pain that is intermittent in nature. This symptom has been going on for an undetermined length of time. She denies pain in the clinic today. Patient has had a single emesis episode recently.  She is able to independently complete all of her activities of daily living. Patient has a "helper" that comes for transportation needs and to run her errands.   Patient denies any familial history of cancers or hematological disorders.    Past Medical History:  Diagnosis Date  . Anemia   . Atrial fibrillation ([place])    . Cardiac arrest ([place])   . Cataract   . CHF (congestive heart failure) ([place])   . Diabetes mellitus without complication ([place])   . Edema    feet/ankles occas  . Hip fracture ([place])   . Hyperlipidemia   . Hypertension   . Hypothyroid   . Neuropathy   . Osteomyelitis ([place])    left first metatarsal  . Stroke [facility])     Past Surgical History:  Procedure Laterality Date  . ACHILLES TENDON SURGERY Left 02/08/2015   Procedure: ACHILLES LENGTHENING/KIDNER;  Surgeon: Albertine Patricia, DPM;  Location: [facility] ORS;  Service: Podiatry;  Laterality: Left;  . APPENDECTOMY    . CARDIAC CATHETERIZATION  08/25/13  . CATARACT EXTRACTION W/PHACO Left 07/20/2014   Procedure: CATARACT EXTRACTION PHACO AND INTRAOCULAR LENS PLACEMENT (IOC);  Surgeon: Leandrew Koyanagi, MD;  Location: [facility] ORS;  Service: Ophthalmology;  Laterality: Left;  [place]  1:18                 AP     23.6             CDE   9.69      lot #[phone]  . CHOLECYSTECTOMY    . CORONARY ANGIOPLASTY    . HALLUX VALGUS AKIN Left 02/08/2015   Procedure: HALLUX VALGUS AKIN/ KELLER;  Surgeon: Albertine Patricia, DPM;  Location: [facility] ORS;  Service: Podiatry;  Laterality: Left;  IVA with Local needs 1 hour for this case   . HEMIARTHROPLASTY HIP Right   . HEMIARTHROPLASTY HIP Left   . INCISION AND DRAINAGE Left 10/11/2014   Procedure: Removal of infected tibial sessmoid;  Surgeon: Albertine Patricia, DPM;  Location: [facility] ORS;  Service: Podiatry;  Laterality: Left;    Family History  Problem Relation Age of Onset  . Heart attack Mother     Social History:  reports that she quit smoking about 27 years ago. Her smoking use included cigarettes. She has a 3.50 pack-year smoking history. she has never used smokeless tobacco. She reports that she does not drink alcohol or use drugs.  Patient is a former 1 pack per day smoker until age 33.  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  She have a driver Sharon [name]).  The patient is accompanied by her  son, Sharon [name], today.  Allergies:  Allergies  Allergen Reactions  . Codeine Other (See Comments)    Other reaction(s): Other (See Comments) Patient states she did not feel herself when she took it. unknown  . Cymbalta [Duloxetine Hcl] Other (See Comments)    Confusion, disorientation  . Duloxetine     Other reaction(s): Other (See Comments) Confusion, disorientation  . Lyrica [Pregabalin] Other (See Comments)    Patient and son states this makes the patient confused.    Current Medications: Current Outpatient Medications  Medication Sig Dispense Refill  . acetaminophen (TYLENOL) 500 MG tablet Take 500 mg by mouth every 4 (four) hours as needed for mild pain or moderate pain.    Marland Kitchen amiodarone (PACERONE) 200 MG tablet Take one tablet by mouth once daily for AFIB    . aspirin 81 MG tablet Take 81 mg by  mouth daily.    Marland Kitchen gabapentin (NEURONTIN) 600 MG tablet Take one tablet by mouth twice daily for neuropathy    . GLIPIZIDE XL 2.5 MG 24 hr tablet Take 2.5 mg by mouth daily. Reported on 02/07/2015  2  . levothyroxine (SYNTHROID, LEVOTHROID) 112 MCG tablet Take one tablet by mouth once daily 30 minutes before breakfast once daily for hypothyroid    . lisinopril (PRINIVIL,ZESTRIL) 10 MG tablet Take 10 mg by mouth daily.    . simvastatin (ZOCOR) 40 MG tablet Take one tablet by mouth once daily at bedtime for cholesterol     No current facility-administered medications for this visit.     Review of Systems:  GENERAL:  Feels "ok".  No fevers or sweats.  "Not much weight loss". PERFORMANCE STATUS (ECOG):  2 HEENT:  Decreased vision in right eye.  No runny nose, sore throat, mouth sores or tenderness. Lungs: No shortness of breath or cough.  No hemoptysis. Cardiac:  No chest pain, palpitations, orthopnea, or PND.  Lays on side or sleeps sitting up. GI:  Stomach "bothers me" at times/indigestion.  No nausea, vomiting, diarrhea, constipation, melena or hematochezia. GU:  No urgency, frequency,  dysuria, or hematuria. Musculoskeletal:  No back pain.  No joint pain.  No muscle tenderness. Extremities:  Lower extremity swelling. No pain. Skin:  No rashes or skin changes. Neuro:  No headache, numbness or weakness, balance or coordination issues. Endocrine:  No diabetes, thyroid issues, hot flashes or night sweats. Psych:  No mood changes, depression or anxiety. Pain:  No focal pain. Review of systems:  All other systems reviewed and found to be negative.  Physical Exam: Blood pressure (!) 148/68, pulse 67, resp. rate 16, height 5\' 5"  (1.651 m), weight 140 lb (63.5 kg). GENERAL:  Well developed, well nourished, woman sitting comfortably in the exam room in no acute distress.  She has a rolling walker at her side. MENTAL STATUS:  Alert and oriented to person, place and time. HEAD: Pearline Cables hair.  Normocephalic, atraumatic, face symmetric, no Cushingoid features. EYES:  Glasses. Green eyes.  Pupils equal round and reactive to light and accomodation.  No conjunctivitis or scleral icterus. ENT:  Oropharynx clear without lesion.  Tongue normal. Mucous membranes moist.  RESPIRATORY:  Clear to auscultation without rales, wheezes or rhonchi. CARDIOVASCULAR:  Regular rate and rhythm.  Murmur.  No rub or gallop. ABDOMEN:  Soft, non-tender, with active bowel sounds, and no hepatosplenomegaly.  No masses. SKIN:  Pale.  Right cheek scratch.  Ecchymosis.  No rashes, ulcers or lesions. EXTREMITIES: Lower extremity edema (right > left).  No skin discoloration or tenderness.  No palpable cords. LYMPH NODES: No palpable cervical, supraclavicular, axillary or inguinal adenopathy  NEUROLOGICAL: Unremarkable. PSYCH:  Appropriate.   No visits with results within 3 Day(s) from this visit.  Latest known visit with results is:  Hospital Outpatient Visit on 01/21/2017  Component Date Value Ref Range Status  . Creatinine, Ser 01/21/2017 1.00  0.44 - 1.00 mg/dL Final    Assessment:  Sharon [name] is a 84  y.o. female with a pancreatic cystic lesion of unclear etiology.  CA19-9 was 27 on 01/29/2017.  Abdomen and pelvic CT on 01/21/2017 revealed a multilocular cystic lesion in the body of the pancreas with associated ductal dilatation throughout the tail of the pancreas where there was extensive side branch ectasia and numerous other smaller cystic lesions. Findings were concerning for potential primary pancreatic malignancy or potentially a large intraductal papillary mucinous neoplasm.  There was mildly enlarged hepatoduodenal ligament lymph node, nonspecific.  There was mild cardiomegaly, mild interstitial pulmonary edema, and small bilateral pleural effusions, suggestive of underlying congestive heart failure.  There was severe atherosclerosis of the immediate infrarenal abdominal aorta which demonstrated luminal narrowing to only 6 mm.  She has iron deficiency anemia and B12 deficiency.   CBC on 01/01/2017 revealed a hematocrit of 27.4, hemoglobin 7.9, MCV 78.1, platelets 335,0000, WBC 7000, and ANC 4910.  Labs on 01/09/2017 revealed a B12 of 230 (low).  Ferritin was 30.  Retic was 3.08%.  Normal studies included:  TSH, folate (6.3), creatinine (1.2), liver function tests. SPEP was negative on 05/28/2016.  Right lower extremity duplex on 01/21/2017 revealed no evidence of DVT.  Symptomatically, patient feels "ok".  She has intermittent abdominal pain. She has vomited x 1 recently. Patient denies chest pain, shortness of breath, and palpitations. Patient is pale in appearance.   Plan: 1.  Labs today:  CBC with diff, CMP, ferritin, iron studies, sed rate, CA19-9, lipase, intrinsic factor antibodies.   2.  Discuss pancreatic cystic mass. Schedule for abdomen MRCP. Will need anxiolytic. Rx sent for lorazepam 0.5 mg to be taken 30 minutes prior to procedure.  3.  Discuss iron deficiency anemia. Patient has been on oral iron, however her iron stores remain low. Will schedule for weekly Venofer 200 mg IV x  3.  4.  Preauthorize B12 and Venofer.   5.  Discuss B12 deficiency. Schedule for B12 injections weekly x 6, then monthly. 6.  Discuss severe atherosclerosis with narrowing of the infrarenal abdominal aorta (lumen only 6 mm).  Patient scheduled to see Dr. Lucky Cowboy on 02/03/2017. 7.  Discuss advanced directives. Patient does not have paperwork in place. Advanced directive packet provided today for patient review.  8.  RTC after MRI abdomen with MRCP for MD assessment, review of workup, and to discuss direction of treatment.    Honor Loh, NP  01/29/2017, 3:36 PM   I saw and evaluated the patient, participating in the key portions of the service and reviewing pertinent diagnostic studies and records.  I reviewed the nurse practitioner's note and agree with the findings and the plan.  The assessment and plan were discussed with the patient.  Additional diagnostic studies of MRCP are needed to clarify her pancreatic lesion and would change the clinical management.  Multiple questions were asked by the patient and answered.   Nolon Stalls, MD 01/29/2017,3:36 PM

## 2017-01-30 LAB — CANCER ANTIGEN 19-9: CA 19-9: 27 U/mL (ref 0–35)

## 2017-01-30 LAB — INTRINSIC FACTOR ANTIBODIES: Intrinsic Factor: 1 AU/mL (ref 0.0–1.1)

## 2017-02-01 ENCOUNTER — Encounter: Payer: Self-pay | Admitting: Hematology and Oncology

## 2017-02-02 ENCOUNTER — Other Ambulatory Visit: Payer: Self-pay | Admitting: Urgent Care

## 2017-02-02 DIAGNOSIS — Z01818 Encounter for other preprocedural examination: Secondary | ICD-10-CM

## 2017-02-03 ENCOUNTER — Ambulatory Visit (INDEPENDENT_AMBULATORY_CARE_PROVIDER_SITE_OTHER): Payer: PPO | Admitting: Vascular Surgery

## 2017-02-03 ENCOUNTER — Telehealth (INDEPENDENT_AMBULATORY_CARE_PROVIDER_SITE_OTHER): Payer: Self-pay

## 2017-02-03 ENCOUNTER — Telehealth: Payer: Self-pay | Admitting: *Deleted

## 2017-02-03 ENCOUNTER — Encounter (INDEPENDENT_AMBULATORY_CARE_PROVIDER_SITE_OTHER): Payer: Self-pay | Admitting: Vascular Surgery

## 2017-02-03 ENCOUNTER — Encounter (INDEPENDENT_AMBULATORY_CARE_PROVIDER_SITE_OTHER): Payer: Self-pay

## 2017-02-03 VITALS — BP 122/51 | HR 70 | Resp 16 | Ht 65.0 in | Wt 140.6 lb

## 2017-02-03 DIAGNOSIS — Q251 Coarctation of aorta: Secondary | ICD-10-CM | POA: Insufficient documentation

## 2017-02-03 DIAGNOSIS — R600 Localized edema: Secondary | ICD-10-CM | POA: Diagnosis not present

## 2017-02-03 DIAGNOSIS — I739 Peripheral vascular disease, unspecified: Secondary | ICD-10-CM | POA: Diagnosis not present

## 2017-02-03 NOTE — Progress Notes (Signed)
Subjective:    Patient ID: Sharon [name], female    DOB: [birth year], 84 y.o.   MRN: [id number] Chief Complaint  Patient presents with  . New Patient (Initial Visit)    ref Sabra Heck for Infrarenal Abdominal Aorta Stenosis   Presents at the request of Dr. Sabra Heck for infrarenal aortic stenosis found incidentally on CT.  The patient underwent a CT of the abdomen and pelvis with contrast for history of weight loss and anemia.  The patient was found to have a pancreatic mass and "aortic atherosclerosis, in addition to at least right coronary artery disease. In addition, there is severe atherosclerosis of the immediate infrarenal abdominal aorta which demonstrates luminal narrowing to only 6 mm".  The patient presents today for further evaluation.  The patient presents today without complaint with the exception of lower extremity edema, right lower extremity greater than left.  The patient denies any claudication-like symptoms even though she does not ambulate a lot, rest pain or ulceration to the lower extremity.  The patient underwent a a right lower extremity venous duplex to rule out a DVT which was negative on January 21, 2017.   Of note: I reviewed the patient's CT with Dr. Lucky Cowboy who noted both of the patient's iliac stents that were placed in 2016 were patent.  Unable to assess the patient's left popliteal stent as a CT was only of the abdomen and pelvis.   Review of Systems  Constitutional: Negative.   HENT: Negative.   Eyes: Negative.   Respiratory: Negative.   Cardiovascular: Positive for leg swelling.       Aortic abdominal stenosis  Gastrointestinal: Negative.   Endocrine: Negative.   Genitourinary: Negative.   Musculoskeletal: Negative.   Skin: Negative.   Allergic/Immunologic: Negative.   Neurological: Negative.   Hematological: Negative.   Psychiatric/Behavioral: Negative.       Objective:   Physical Exam  Constitutional: She is oriented to person, place, and time. She appears  well-developed and well-nourished. No distress.  HENT:  Head: Normocephalic and atraumatic.  Eyes: Conjunctivae are normal. Pupils are equal, round, and reactive to light.  Neck: Normal range of motion.  Cardiovascular: Normal rate, regular rhythm, normal heart sounds and intact distal pulses.  Pulses:      Radial pulses are 2+ on the right side, and 2+ on the left side.  Hard to palpate pedal pulses due to the patient's edema  Pulmonary/Chest: Effort normal and breath sounds normal.  Musculoskeletal: Normal range of motion. She exhibits edema (Right lower extremity: Moderate 2+ pitting edema / Left lower extremity: Moderate lower extremity edema ).  Neurological: She is alert and oriented to person, place, and time.  Skin: Skin is warm and dry. She is not diaphoretic.  No active ulcerations noted to the bilateral lower extremity or the bilateral feet  Psychiatric: She has a normal mood and affect. Her behavior is normal. Judgment and thought content normal.  Vitals reviewed.  BP (!) 122/51 (BP Location: Right Arm)   Pulse 70   Resp 16   Ht 5\' 5"  (1.651 m)   Wt 140 lb 9.6 oz (63.8 kg)   BMI 23.40 kg/m   Past Medical History:  Diagnosis Date  . Anemia   . Atrial fibrillation ([place])   . Cardiac arrest ([place])   . Cataract   . CHF (congestive heart failure) ([place])   . Diabetes mellitus without complication ([place])   . Edema    feet/ankles occas  . Hip fracture ([place])   .  Hyperlipidemia   . Hypertension   . Hypothyroid   . Neuropathy   . Osteomyelitis ([place])    left first metatarsal  . Stroke [facility])    Social History   Socioeconomic History  . Marital status: Widowed    Spouse name: Not on file  . Number of children: Not on file  . Years of education: Not on file  . Highest education level: Not on file  Social Needs  . Financial resource strain: Not on file  . Food insecurity - worry: Not on file  . Food insecurity - inability: Not on file  . Transportation needs - medical:  Not on file  . Transportation needs - non-medical: Not on file  Occupational History  . Occupation: retired.  Tobacco Use  . Smoking status: Former Smoker    Packs/day: 0.50    Years: 7.00    Pack years: 3.50    Types: Cigarettes    Last attempt to quit: 05/30/1989    Years since quitting: 27.7  . Smokeless tobacco: Never Used  Substance and Sexual Activity  . Alcohol use: No    Alcohol/week: 0.0 oz  . Drug use: No  . Sexual activity: Not on file  Other Topics Concern  . Not on file  Social History Narrative   Lives at home by herself.   Ambulates with a walker   Past Surgical History:  Procedure Laterality Date  . ACHILLES TENDON SURGERY Left 02/08/2015   Procedure: ACHILLES LENGTHENING/KIDNER;  Surgeon: Albertine Patricia, DPM;  Location: [facility] ORS;  Service: Podiatry;  Laterality: Left;  . APPENDECTOMY    . CARDIAC CATHETERIZATION  08/25/13  . CATARACT EXTRACTION W/PHACO Left 07/20/2014   Procedure: CATARACT EXTRACTION PHACO AND INTRAOCULAR LENS PLACEMENT (IOC);  Surgeon: Leandrew Koyanagi, MD;  Location: [facility] ORS;  Service: Ophthalmology;  Laterality: Left;  [place]  1:18                 AP     23.6             CDE   9.69      lot #[id number]  . CHOLECYSTECTOMY    . CORONARY ANGIOPLASTY    . HALLUX VALGUS AKIN Left 02/08/2015   Procedure: HALLUX VALGUS AKIN/ KELLER;  Surgeon: Albertine Patricia, DPM;  Location: [facility] ORS;  Service: Podiatry;  Laterality: Left;  IVA with Local needs 1 hour for this case   . HEMIARTHROPLASTY HIP Right   . HEMIARTHROPLASTY HIP Left   . INCISION AND DRAINAGE Left 10/11/2014   Procedure: Removal of infected tibial sessmoid;  Surgeon: Albertine Patricia, DPM;  Location: [facility] ORS;  Service: Podiatry;  Laterality: Left;   Family History  Problem Relation Age of Onset  . Heart attack Mother    Allergies  Allergen Reactions  . Carbamazepine Other (See Comments)  . Codeine Other (See Comments)    Other reaction(s): Other (See Comments) Patient states she did  not feel herself when she took it. unknown  . Cymbalta [Duloxetine Hcl] Other (See Comments)    Confusion, disorientation  . Duloxetine     Other reaction(s): Other (See Comments) Confusion, disorientation  . Lyrica [Pregabalin] Other (See Comments)    Patient and son states this makes the patient confused.      Assessment & Plan:  Presents at the request of Dr. Sabra Heck for infrarenal aortic stenosis found incidentally on CT.  The patient underwent a CT of the abdomen and pelvis with contrast for history of weight loss and  anemia.  The patient was found to have a pancreatic mass and "aortic atherosclerosis, in addition to at least right coronary artery disease. In addition, there is severe atherosclerosis of the immediate infrarenal abdominal aorta which demonstrates luminal narrowing to only 6 mm".  The patient presents today for further evaluation.  The patient presents today without complaint with the exception of lower extremity edema, right lower extremity greater than left.  The patient denies any claudication-like symptoms even though she does not ambulate a lot, rest pain or ulceration to the lower extremity.  The patient underwent a a right lower extremity venous duplex to rule out a DVT which was negative on January 21, 2017.   Of note: I reviewed the patient's CT with Dr. Lucky Cowboy who noted both of the patient's iliac stents that were placed in 2016 were patent.  Unable to assess the patient's left popliteal stent as a CT was only of the abdomen and pelvis.  1. PAD (peripheral artery disease) (HCC) - Stable The patient has a history of peripheral artery disease requiring bilateral iliac stents and a left popliteal stent. The patient has not followed up since her 2016 intervention The patient denies any claudication-like symptoms even though she does not ambulate much, rest pain or ulcerations to her feet or toes. The aortic stenosis seen on CT is not of any immediate or acute danger and I  will bring the patient back to undergo an ABI to assess arterial blood flow to both extremities I have discussed with the patient at length the risk factors for and pathogenesis of atherosclerotic disease and encouraged a healthy diet, regular exercise regimen and blood pressure / glucose control.  The patient was encouraged to call the office in the interim if he experiences any claudication like symptoms, rest pain or ulcers to his feet / toes.  - VAS [place] ABI WITH/WO TBI; Future  2. Abdominal aortic stenosis - Stable This is most likely been present for months to years. The patient would be experiencing claudication-like symptoms rest pain or ulceration to her feet or toes if the stenosis was significant enough to limit blood flow distally.  3. Bilateral lower extremity edema - New The patient was encouraged to wear graduated compression stockings (20-30 mmHg) on a daily basis. The patient was instructed to begin wearing the stockings first thing in the morning and removing them in the evening. The patient was instructed specifically not to sleep in the stockings.  In addition, behavioral modification including elevation during the day will be initiated. Anti-inflammatories for pain. We can assess the patient's progress with compression stockings when she follows up to undergo an ABI The patient was instructed to call the office in the interim if any worsening edema or ulcerations to the legs, feet or toes occurs. The patient expresses their understanding.  Current Outpatient Medications on File Prior to Visit  Medication Sig Dispense Refill  . acetaminophen (TYLENOL) 500 MG tablet Take 500 mg by mouth every 4 (four) hours as needed for mild pain or moderate pain.    Marland Kitchen aspirin 81 MG tablet Take 81 mg by mouth daily.    . cholecalciferol (VITAMIN D) 1000 units tablet Take 1,000 Units by mouth daily.    . furosemide (LASIX) 20 MG tablet Take 20 mg by mouth daily.    Marland Kitchen gabapentin (NEURONTIN)  600 MG tablet 600 mg 4 (four) times daily. Take one tablet by mouth twice daily for neuropathy     . glimepiride (AMARYL) 2 MG  tablet Take 2 mg by mouth daily with breakfast.    . levothyroxine (SYNTHROID, LEVOTHROID) 112 MCG tablet Take one tablet by mouth once daily 30 minutes before breakfast once daily for hypothyroid    . metolazone (ZAROXOLYN) 2.5 MG tablet Take 2.5 mg by mouth every other day.    . Multiple Vitamins-Minerals (PRESERVISION AREDS 2+MULTI VIT PO) Take by mouth.    . simvastatin (ZOCOR) 40 MG tablet Take one tablet by mouth once daily at bedtime for cholesterol    . amiodarone (PACERONE) 200 MG tablet Take one tablet by mouth once daily for AFIB    . GLIPIZIDE XL 2.5 MG 24 hr tablet Take 2.5 mg by mouth daily. Reported on 02/07/2015  2  . lisinopril (PRINIVIL,ZESTRIL) 10 MG tablet Take 10 mg by mouth daily.    Marland Kitchen LORazepam (ATIVAN) 0.5 MG tablet Tale 1 tab (0.5MG ) 30 minutes prior to scheduled MRI. (Patient not taking: Reported on 02/03/2017) 1 tablet 0   No current facility-administered medications on file prior to visit.    There are no Patient Instructions on file for this visit. No Follow-up on file.  Gregorio Worley A Abdulhadi Stopa, PA-C

## 2017-02-03 NOTE — Telephone Encounter (Signed)
Patient was seen today by the PA Hezzie Bump for stenosis of her abdominal aorta. Per the note the patient had no pain. The patient called back to ask for pain medication for her feet. Per the PA the patient needs to call her PCP to discuss pain medication for her feet.

## 2017-02-03 NOTE — Telephone Encounter (Signed)
Sharon [name] called asking if there are special instructions for her iron infusion, I called him back and told him no to eat drink and do what ever she normally does  Prior to iron. He then asked if MRI is going to give her Ativan before her scan. I explained to him that they don't do that, the patient has to bring their own medicine or take prior to appointment. He asked if we would order it as she does not have any.   I let him know that prescription had been sent on 1/17 to [facility] and he can pick it up.

## 2017-02-04 ENCOUNTER — Inpatient Hospital Stay: Payer: PPO

## 2017-02-04 DIAGNOSIS — K869 Disease of pancreas, unspecified: Secondary | ICD-10-CM | POA: Diagnosis not present

## 2017-02-04 DIAGNOSIS — Z01818 Encounter for other preprocedural examination: Secondary | ICD-10-CM

## 2017-02-04 DIAGNOSIS — E538 Deficiency of other specified B group vitamins: Secondary | ICD-10-CM

## 2017-02-04 LAB — BASIC METABOLIC PANEL
ANION GAP: 8 (ref 5–15)
BUN: 54 mg/dL — ABNORMAL HIGH (ref 6–20)
CALCIUM: 8.8 mg/dL — AB (ref 8.9–10.3)
CO2: 23 mmol/L (ref 22–32)
CREATININE: 1.43 mg/dL — AB (ref 0.44–1.00)
Chloride: 96 mmol/L — ABNORMAL LOW (ref 101–111)
GFR, EST AFRICAN AMERICAN: 39 mL/min — AB (ref >60)
GFR, EST NON AFRICAN AMERICAN: 33 mL/min — AB (ref >60)
GLUCOSE: 248 mg/dL — AB (ref 65–99)
Potassium: 4.7 mmol/L (ref 3.5–5.1)
Sodium: 127 mmol/L — ABNORMAL LOW (ref 135–145)

## 2017-02-04 MED ORDER — CYANOCOBALAMIN 1000 MCG/ML IJ SOLN
1000.0000 ug | Freq: Once | INTRAMUSCULAR | Status: AC
  Administered 2017-02-04: 1000 ug via INTRAMUSCULAR

## 2017-02-05 ENCOUNTER — Telehealth: Payer: Self-pay | Admitting: Urgent Care

## 2017-02-05 NOTE — Telephone Encounter (Signed)
Called to review changes to plan of care with patient's son, Sharon [name]. Son made aware that MRI with MRCP has been cancelled. Radiology feels as if this study will be sub-optimal. I reviewed the procedure with the patient's son. Due to the patient's age, it will likely be difficult for her to remain in a stationary position in order to complete the study. Reviewed possibility of  motion degraded imaging. After patient discussed at tumor board today, patient is to be scheduled for an EUS with [place] [facility]. Son was advised that the location of the procedure is TBD at this time, as we are trying to get patient worked in to be seen as quickly as possible.  Mariea Clonts, RN to follow up with patient's son regarding next steps and scheduling. Sharon [name] verbalized understanding and consent to proceed with plan as discussed.

## 2017-02-06 ENCOUNTER — Telehealth: Payer: Self-pay

## 2017-02-06 ENCOUNTER — Ambulatory Visit: Payer: PPO

## 2017-02-06 ENCOUNTER — Inpatient Hospital Stay: Payer: PPO

## 2017-02-06 VITALS — BP 136/67 | HR 73 | Temp 97.2°F | Resp 20

## 2017-02-06 DIAGNOSIS — K869 Disease of pancreas, unspecified: Secondary | ICD-10-CM | POA: Diagnosis not present

## 2017-02-06 DIAGNOSIS — E538 Deficiency of other specified B group vitamins: Secondary | ICD-10-CM

## 2017-02-06 MED ORDER — IRON SUCROSE 20 MG/ML IV SOLN
200.0000 mg | Freq: Once | INTRAVENOUS | Status: AC
  Administered 2017-02-06: 200 mg via INTRAVENOUS
  Filled 2017-02-06: qty 10

## 2017-02-06 MED ORDER — SODIUM CHLORIDE 0.9 % IV SOLN
Freq: Once | INTRAVENOUS | Status: AC
  Administered 2017-02-06: 14:00:00 via INTRAVENOUS
  Filled 2017-02-06: qty 1000

## 2017-02-06 NOTE — Telephone Encounter (Signed)
Spoke with son [name]. He prefers EUS to be performed at [facility]. I have sent referral and records. They will call [name] with the date/time/instructions. He verbalized understanding. Oncology Nurse Navigator Documentation  Navigator Location: [facility] (02/06/17 1300)   )Navigator Encounter Type: Telephone (02/06/17 1300) Telephone: [place] Call (02/06/17 1300)                       Barriers/Navigation Needs: Coordination of Care (02/06/17 1300)   Interventions: Coordination of Care (02/06/17 1300)   Coordination of Care: EUS (02/06/17 1300)                  Time Spent with Patient: 30 (02/06/17 1300)

## 2017-02-09 DIAGNOSIS — D500 Iron deficiency anemia secondary to blood loss (chronic): Secondary | ICD-10-CM | POA: Diagnosis not present

## 2017-02-09 NOTE — Telephone Encounter (Signed)
x

## 2017-02-10 ENCOUNTER — Telehealth: Payer: Self-pay

## 2017-02-10 NOTE — Telephone Encounter (Signed)
error 

## 2017-02-11 ENCOUNTER — Inpatient Hospital Stay: Payer: PPO

## 2017-02-11 DIAGNOSIS — E538 Deficiency of other specified B group vitamins: Secondary | ICD-10-CM

## 2017-02-11 DIAGNOSIS — K869 Disease of pancreas, unspecified: Secondary | ICD-10-CM | POA: Diagnosis not present

## 2017-02-11 MED ORDER — CYANOCOBALAMIN 1000 MCG/ML IJ SOLN
1000.0000 ug | Freq: Once | INTRAMUSCULAR | Status: AC
  Administered 2017-02-11: 1000 ug via INTRAMUSCULAR

## 2017-02-13 ENCOUNTER — Ambulatory Visit: Payer: PPO | Admitting: Hematology and Oncology

## 2017-02-13 ENCOUNTER — Inpatient Hospital Stay: Payer: PPO | Attending: Hematology and Oncology

## 2017-02-13 VITALS — BP 130/72 | HR 72 | Temp 98.0°F | Resp 20

## 2017-02-13 DIAGNOSIS — G629 Polyneuropathy, unspecified: Secondary | ICD-10-CM | POA: Diagnosis not present

## 2017-02-13 DIAGNOSIS — K869 Disease of pancreas, unspecified: Secondary | ICD-10-CM | POA: Diagnosis not present

## 2017-02-13 DIAGNOSIS — R609 Edema, unspecified: Secondary | ICD-10-CM | POA: Diagnosis not present

## 2017-02-13 DIAGNOSIS — I509 Heart failure, unspecified: Secondary | ICD-10-CM | POA: Diagnosis not present

## 2017-02-13 DIAGNOSIS — E538 Deficiency of other specified B group vitamins: Secondary | ICD-10-CM | POA: Insufficient documentation

## 2017-02-13 DIAGNOSIS — I4891 Unspecified atrial fibrillation: Secondary | ICD-10-CM | POA: Insufficient documentation

## 2017-02-13 DIAGNOSIS — I2510 Atherosclerotic heart disease of native coronary artery without angina pectoris: Secondary | ICD-10-CM | POA: Insufficient documentation

## 2017-02-13 DIAGNOSIS — E785 Hyperlipidemia, unspecified: Secondary | ICD-10-CM | POA: Insufficient documentation

## 2017-02-13 DIAGNOSIS — I10 Essential (primary) hypertension: Secondary | ICD-10-CM | POA: Diagnosis not present

## 2017-02-13 DIAGNOSIS — Z79899 Other long term (current) drug therapy: Secondary | ICD-10-CM | POA: Diagnosis not present

## 2017-02-13 DIAGNOSIS — Z87891 Personal history of nicotine dependence: Secondary | ICD-10-CM | POA: Insufficient documentation

## 2017-02-13 DIAGNOSIS — J90 Pleural effusion, not elsewhere classified: Secondary | ICD-10-CM | POA: Diagnosis not present

## 2017-02-13 DIAGNOSIS — Z8673 Personal history of transient ischemic attack (TIA), and cerebral infarction without residual deficits: Secondary | ICD-10-CM | POA: Insufficient documentation

## 2017-02-13 DIAGNOSIS — J811 Chronic pulmonary edema: Secondary | ICD-10-CM | POA: Insufficient documentation

## 2017-02-13 DIAGNOSIS — E119 Type 2 diabetes mellitus without complications: Secondary | ICD-10-CM | POA: Diagnosis not present

## 2017-02-13 DIAGNOSIS — Z7984 Long term (current) use of oral hypoglycemic drugs: Secondary | ICD-10-CM | POA: Insufficient documentation

## 2017-02-13 DIAGNOSIS — D509 Iron deficiency anemia, unspecified: Secondary | ICD-10-CM | POA: Insufficient documentation

## 2017-02-13 DIAGNOSIS — M899 Disorder of bone, unspecified: Secondary | ICD-10-CM | POA: Insufficient documentation

## 2017-02-13 DIAGNOSIS — E039 Hypothyroidism, unspecified: Secondary | ICD-10-CM | POA: Insufficient documentation

## 2017-02-13 DIAGNOSIS — I517 Cardiomegaly: Secondary | ICD-10-CM | POA: Diagnosis not present

## 2017-02-13 DIAGNOSIS — Z7982 Long term (current) use of aspirin: Secondary | ICD-10-CM | POA: Diagnosis not present

## 2017-02-13 MED ORDER — SODIUM CHLORIDE 0.9 % IV SOLN
Freq: Once | INTRAVENOUS | Status: AC
  Administered 2017-02-13: 14:00:00 via INTRAVENOUS
  Filled 2017-02-13: qty 1000

## 2017-02-13 MED ORDER — IRON SUCROSE 20 MG/ML IV SOLN
200.0000 mg | Freq: Once | INTRAVENOUS | Status: AC
  Administered 2017-02-13: 200 mg via INTRAVENOUS
  Filled 2017-02-13: qty 10

## 2017-02-16 ENCOUNTER — Other Ambulatory Visit: Payer: Self-pay | Admitting: Hematology and Oncology

## 2017-02-16 ENCOUNTER — Telehealth: Payer: Self-pay | Admitting: *Deleted

## 2017-02-16 ENCOUNTER — Other Ambulatory Visit: Payer: Self-pay | Admitting: Urgent Care

## 2017-02-16 DIAGNOSIS — E039 Hypothyroidism, unspecified: Secondary | ICD-10-CM | POA: Diagnosis not present

## 2017-02-16 DIAGNOSIS — I10 Essential (primary) hypertension: Secondary | ICD-10-CM | POA: Diagnosis not present

## 2017-02-16 DIAGNOSIS — E871 Hypo-osmolality and hyponatremia: Secondary | ICD-10-CM | POA: Diagnosis not present

## 2017-02-16 DIAGNOSIS — I2510 Atherosclerotic heart disease of native coronary artery without angina pectoris: Secondary | ICD-10-CM | POA: Diagnosis not present

## 2017-02-16 DIAGNOSIS — D649 Anemia, unspecified: Secondary | ICD-10-CM

## 2017-02-16 DIAGNOSIS — K922 Gastrointestinal hemorrhage, unspecified: Secondary | ICD-10-CM | POA: Diagnosis not present

## 2017-02-16 DIAGNOSIS — I482 Chronic atrial fibrillation: Secondary | ICD-10-CM | POA: Diagnosis not present

## 2017-02-16 DIAGNOSIS — K589 Irritable bowel syndrome without diarrhea: Secondary | ICD-10-CM | POA: Diagnosis not present

## 2017-02-16 DIAGNOSIS — E1151 Type 2 diabetes mellitus with diabetic peripheral angiopathy without gangrene: Secondary | ICD-10-CM | POA: Diagnosis not present

## 2017-02-16 DIAGNOSIS — F3341 Major depressive disorder, recurrent, in partial remission: Secondary | ICD-10-CM | POA: Diagnosis not present

## 2017-02-16 NOTE — Telephone Encounter (Signed)
Per Sharon [name] "Verbal" add on 2 units Blood for 02/17/17 Called and made patient aware of date and time of her appt on 02/17/17. I also called her son Sharon [name], Message was also left on his vmail To make him aware as well.

## 2017-02-17 ENCOUNTER — Inpatient Hospital Stay: Payer: PPO | Admitting: *Deleted

## 2017-02-17 ENCOUNTER — Other Ambulatory Visit: Payer: Self-pay | Admitting: Urgent Care

## 2017-02-17 ENCOUNTER — Inpatient Hospital Stay: Payer: PPO

## 2017-02-17 DIAGNOSIS — D649 Anemia, unspecified: Secondary | ICD-10-CM

## 2017-02-17 DIAGNOSIS — K869 Disease of pancreas, unspecified: Secondary | ICD-10-CM | POA: Diagnosis not present

## 2017-02-17 LAB — IRON AND TIBC
Iron: 25 ug/dL — ABNORMAL LOW (ref 28–170)
SATURATION RATIOS: 5 % — AB (ref 10.4–31.8)
TIBC: 492 ug/dL — AB (ref 250–450)
UIBC: 467 ug/dL

## 2017-02-17 LAB — CBC WITH DIFFERENTIAL/PLATELET
Basophils Absolute: 0.1 10*3/uL (ref 0–0.1)
Basophils Relative: 1 %
EOS ABS: 0.2 10*3/uL (ref 0–0.7)
EOS PCT: 3 %
HCT: 24 % — ABNORMAL LOW (ref 35.0–47.0)
Hemoglobin: 7.3 g/dL — ABNORMAL LOW (ref 12.0–16.0)
LYMPHS ABS: 1.3 10*3/uL (ref 1.0–3.6)
Lymphocytes Relative: 15 %
MCH: 22.7 pg — AB (ref 26.0–34.0)
MCHC: 30.6 g/dL — AB (ref 32.0–36.0)
MCV: 74.1 fL — ABNORMAL LOW (ref 80.0–100.0)
Monocytes Absolute: 0.6 10*3/uL (ref 0.2–0.9)
Monocytes Relative: 8 %
Neutro Abs: 6.3 10*3/uL (ref 1.4–6.5)
Neutrophils Relative %: 73 %
PLATELETS: 320 10*3/uL (ref 150–440)
RBC: 3.24 MIL/uL — AB (ref 3.80–5.20)
RDW: 23.6 % — ABNORMAL HIGH (ref 11.5–14.5)
WBC: 8.6 10*3/uL (ref 3.6–11.0)

## 2017-02-17 LAB — FERRITIN: Ferritin: 124 ng/mL (ref 11–307)

## 2017-02-17 LAB — DAT, POLYSPECIFIC AHG (ARMC ONLY): Polyspecific AHG test: NEGATIVE

## 2017-02-17 LAB — PREPARE RBC (CROSSMATCH)

## 2017-02-17 LAB — ABO/RH: ABO/RH(D): O POS

## 2017-02-17 MED ORDER — DIPHENHYDRAMINE HCL 25 MG PO CAPS
25.0000 mg | ORAL_CAPSULE | Freq: Once | ORAL | Status: AC
  Administered 2017-02-17: 25 mg via ORAL
  Filled 2017-02-17: qty 1

## 2017-02-17 MED ORDER — SODIUM CHLORIDE 0.9 % IV SOLN
250.0000 mL | Freq: Once | INTRAVENOUS | Status: AC
  Administered 2017-02-17: 250 mL via INTRAVENOUS
  Filled 2017-02-17: qty 250

## 2017-02-17 MED ORDER — SODIUM CHLORIDE 0.9% FLUSH
10.0000 mL | INTRAVENOUS | Status: AC | PRN
  Filled 2017-02-17: qty 10

## 2017-02-17 MED ORDER — ACETAMINOPHEN 325 MG PO TABS
650.0000 mg | ORAL_TABLET | Freq: Once | ORAL | Status: AC
  Administered 2017-02-17: 650 mg via ORAL
  Filled 2017-02-17: qty 2

## 2017-02-18 ENCOUNTER — Ambulatory Visit: Payer: PPO

## 2017-02-18 DIAGNOSIS — E039 Hypothyroidism, unspecified: Secondary | ICD-10-CM | POA: Diagnosis not present

## 2017-02-18 DIAGNOSIS — I2510 Atherosclerotic heart disease of native coronary artery without angina pectoris: Secondary | ICD-10-CM | POA: Diagnosis not present

## 2017-02-18 DIAGNOSIS — I10 Essential (primary) hypertension: Secondary | ICD-10-CM | POA: Diagnosis not present

## 2017-02-18 DIAGNOSIS — Z7982 Long term (current) use of aspirin: Secondary | ICD-10-CM | POA: Diagnosis not present

## 2017-02-18 DIAGNOSIS — R933 Abnormal findings on diagnostic imaging of other parts of digestive tract: Secondary | ICD-10-CM | POA: Diagnosis not present

## 2017-02-18 DIAGNOSIS — Z96643 Presence of artificial hip joint, bilateral: Secondary | ICD-10-CM | POA: Diagnosis not present

## 2017-02-18 DIAGNOSIS — E785 Hyperlipidemia, unspecified: Secondary | ICD-10-CM | POA: Diagnosis not present

## 2017-02-18 DIAGNOSIS — Z9842 Cataract extraction status, left eye: Secondary | ICD-10-CM | POA: Diagnosis not present

## 2017-02-18 DIAGNOSIS — Z888 Allergy status to other drugs, medicaments and biological substances status: Secondary | ICD-10-CM | POA: Diagnosis not present

## 2017-02-18 DIAGNOSIS — K862 Cyst of pancreas: Secondary | ICD-10-CM | POA: Diagnosis not present

## 2017-02-18 DIAGNOSIS — M810 Age-related osteoporosis without current pathological fracture: Secondary | ICD-10-CM | POA: Diagnosis not present

## 2017-02-18 DIAGNOSIS — H3530 Unspecified macular degeneration: Secondary | ICD-10-CM | POA: Diagnosis not present

## 2017-02-18 DIAGNOSIS — E1140 Type 2 diabetes mellitus with diabetic neuropathy, unspecified: Secondary | ICD-10-CM | POA: Diagnosis not present

## 2017-02-18 DIAGNOSIS — K228 Other specified diseases of esophagus: Secondary | ICD-10-CM | POA: Diagnosis not present

## 2017-02-18 DIAGNOSIS — Z79899 Other long term (current) drug therapy: Secondary | ICD-10-CM | POA: Diagnosis not present

## 2017-02-18 DIAGNOSIS — E119 Type 2 diabetes mellitus without complications: Secondary | ICD-10-CM | POA: Diagnosis not present

## 2017-02-18 DIAGNOSIS — Z87891 Personal history of nicotine dependence: Secondary | ICD-10-CM | POA: Diagnosis not present

## 2017-02-18 DIAGNOSIS — Z8679 Personal history of other diseases of the circulatory system: Secondary | ICD-10-CM | POA: Diagnosis not present

## 2017-02-18 LAB — TYPE AND SCREEN
ABO/RH(D): O POS
ANTIBODY SCREEN: NEGATIVE
Unit division: 0

## 2017-02-18 LAB — BPAM RBC
BLOOD PRODUCT EXPIRATION DATE: 201902092359
ISSUE DATE / TIME: 201902051047
Unit Type and Rh: 9500

## 2017-02-19 ENCOUNTER — Inpatient Hospital Stay: Payer: PPO

## 2017-02-19 ENCOUNTER — Ambulatory Visit: Payer: PPO

## 2017-02-19 ENCOUNTER — Ambulatory Visit: Payer: PPO | Admitting: Hematology and Oncology

## 2017-02-20 ENCOUNTER — Inpatient Hospital Stay: Payer: PPO

## 2017-02-20 ENCOUNTER — Other Ambulatory Visit: Payer: Self-pay | Admitting: Hematology and Oncology

## 2017-02-20 ENCOUNTER — Telehealth: Payer: Self-pay | Admitting: Urgent Care

## 2017-02-20 VITALS — BP 149/60 | HR 65 | Temp 98.6°F | Resp 20

## 2017-02-20 DIAGNOSIS — K869 Disease of pancreas, unspecified: Secondary | ICD-10-CM | POA: Diagnosis not present

## 2017-02-20 DIAGNOSIS — E538 Deficiency of other specified B group vitamins: Secondary | ICD-10-CM

## 2017-02-20 MED ORDER — CYANOCOBALAMIN 1000 MCG/ML IJ SOLN
1000.0000 ug | Freq: Once | INTRAMUSCULAR | Status: AC
  Administered 2017-02-20: 1000 ug via INTRAMUSCULAR
  Filled 2017-02-20: qty 1

## 2017-02-20 MED ORDER — IRON SUCROSE 20 MG/ML IV SOLN
200.0000 mg | Freq: Once | INTRAVENOUS | Status: AC
  Administered 2017-02-20: 200 mg via INTRAVENOUS
  Filled 2017-02-20: qty 10

## 2017-02-20 MED ORDER — SODIUM CHLORIDE 0.9 % IV SOLN
Freq: Once | INTRAVENOUS | Status: AC
  Administered 2017-02-20: 14:00:00 via INTRAVENOUS
  Filled 2017-02-20: qty 1000

## 2017-02-20 NOTE — Telephone Encounter (Signed)
Returned call to [name]) to discuss upcoming appointment on 02/23/2017. LMOM. Results from biopsy are not available at this time via care everywhere. Son voiced concerns during a previous call about having to drive in from [place] when the results are not back. We will plan on cancelling appointments for 02/23/2017. I will call [facility] on Monday to determine a turnaround time for the pathology results. Once I have this information, I will reach back out to patient's son to discuss RTC plan of care.   Sharon [name] was advised to return a call to the clinic with any questions or concerns. Message sent to scheduling to cancel all appointments scheduled for Monday 02/23/2017.

## 2017-02-23 ENCOUNTER — Inpatient Hospital Stay: Payer: PPO | Admitting: Hematology and Oncology

## 2017-02-23 ENCOUNTER — Telehealth: Payer: Self-pay | Admitting: *Deleted

## 2017-02-23 ENCOUNTER — Other Ambulatory Visit: Payer: PPO

## 2017-02-23 NOTE — Telephone Encounter (Signed)
Patient son [name] called in regards to his mother.  He stated that he should have been called today with and update and results from [facility]. (I did make him aware of the message I received on 02/20/17 from [place] about the follow on Monday) He said this has been ongoing for about 3 weeks now and something needs to be done.  He asked if someone could give him a call today @ [phone] and that he will be waiting for a call back.

## 2017-02-23 NOTE — Telephone Encounter (Signed)
-----   Message from [place], [place] sent at 02/23/2017  4:11 PM EST ----- Regarding: Son call wanting an update FYI...  Patient son [name] called in regards to his mother. He stated that he should have been called today with and update and results from [facility]. (I did make he aware that I received a message I received on 02/20/17 from [place] about the follow on Monday)He said this has been ongoing for about 3 weeks now and something needs to be don. He asked if someone could give him a call today @ [phone] and that he will be waiting for a call back.

## 2017-02-23 NOTE — Telephone Encounter (Signed)
Per in basket from [place] and Dr. Mike Gip - I was asked by Dr. Mike Gip to call patient's son regarding patient's biopsy results.  Patient answered the phone.  She states the son was not there and asked me to give her the message.  I informed her that her results have not come back and we would be in touch as soon as we had a report.  She verbalized understanding and thanked me for calling.

## 2017-02-25 ENCOUNTER — Inpatient Hospital Stay: Payer: PPO

## 2017-02-25 DIAGNOSIS — E538 Deficiency of other specified B group vitamins: Secondary | ICD-10-CM

## 2017-02-25 DIAGNOSIS — K869 Disease of pancreas, unspecified: Secondary | ICD-10-CM | POA: Diagnosis not present

## 2017-02-25 MED ORDER — CYANOCOBALAMIN 1000 MCG/ML IJ SOLN
1000.0000 ug | Freq: Once | INTRAMUSCULAR | Status: AC
  Administered 2017-02-25: 1000 ug via INTRAMUSCULAR

## 2017-02-27 ENCOUNTER — Ambulatory Visit: Payer: PPO | Admitting: Hematology and Oncology

## 2017-03-03 ENCOUNTER — Inpatient Hospital Stay (HOSPITAL_BASED_OUTPATIENT_CLINIC_OR_DEPARTMENT_OTHER): Payer: PPO | Admitting: Hematology and Oncology

## 2017-03-03 ENCOUNTER — Encounter: Payer: Self-pay | Admitting: Hematology and Oncology

## 2017-03-03 VITALS — BP 179/79 | HR 74 | Temp 98.1°F | Resp 20 | Wt 140.6 lb

## 2017-03-03 DIAGNOSIS — K869 Disease of pancreas, unspecified: Secondary | ICD-10-CM | POA: Diagnosis not present

## 2017-03-03 DIAGNOSIS — R609 Edema, unspecified: Secondary | ICD-10-CM | POA: Diagnosis not present

## 2017-03-03 DIAGNOSIS — I509 Heart failure, unspecified: Secondary | ICD-10-CM | POA: Diagnosis not present

## 2017-03-03 DIAGNOSIS — I2510 Atherosclerotic heart disease of native coronary artery without angina pectoris: Secondary | ICD-10-CM

## 2017-03-03 DIAGNOSIS — Z7984 Long term (current) use of oral hypoglycemic drugs: Secondary | ICD-10-CM

## 2017-03-03 DIAGNOSIS — Z87891 Personal history of nicotine dependence: Secondary | ICD-10-CM

## 2017-03-03 DIAGNOSIS — E119 Type 2 diabetes mellitus without complications: Secondary | ICD-10-CM | POA: Diagnosis not present

## 2017-03-03 DIAGNOSIS — Z79899 Other long term (current) drug therapy: Secondary | ICD-10-CM | POA: Diagnosis not present

## 2017-03-03 DIAGNOSIS — D509 Iron deficiency anemia, unspecified: Secondary | ICD-10-CM | POA: Diagnosis not present

## 2017-03-03 DIAGNOSIS — I4891 Unspecified atrial fibrillation: Secondary | ICD-10-CM | POA: Diagnosis not present

## 2017-03-03 DIAGNOSIS — J90 Pleural effusion, not elsewhere classified: Secondary | ICD-10-CM

## 2017-03-03 DIAGNOSIS — G629 Polyneuropathy, unspecified: Secondary | ICD-10-CM | POA: Diagnosis not present

## 2017-03-03 DIAGNOSIS — I10 Essential (primary) hypertension: Secondary | ICD-10-CM | POA: Diagnosis not present

## 2017-03-03 DIAGNOSIS — Z7189 Other specified counseling: Secondary | ICD-10-CM

## 2017-03-03 DIAGNOSIS — M899 Disorder of bone, unspecified: Secondary | ICD-10-CM

## 2017-03-03 DIAGNOSIS — I517 Cardiomegaly: Secondary | ICD-10-CM

## 2017-03-03 DIAGNOSIS — J811 Chronic pulmonary edema: Secondary | ICD-10-CM

## 2017-03-03 DIAGNOSIS — Z7982 Long term (current) use of aspirin: Secondary | ICD-10-CM

## 2017-03-03 DIAGNOSIS — E785 Hyperlipidemia, unspecified: Secondary | ICD-10-CM | POA: Diagnosis not present

## 2017-03-03 DIAGNOSIS — E039 Hypothyroidism, unspecified: Secondary | ICD-10-CM

## 2017-03-03 DIAGNOSIS — E538 Deficiency of other specified B group vitamins: Secondary | ICD-10-CM | POA: Diagnosis not present

## 2017-03-03 DIAGNOSIS — Z8673 Personal history of transient ischemic attack (TIA), and cerebral infarction without residual deficits: Secondary | ICD-10-CM

## 2017-03-03 DIAGNOSIS — K8689 Other specified diseases of pancreas: Secondary | ICD-10-CM

## 2017-03-03 NOTE — Progress Notes (Signed)
[facility] day:  03/03/2017  Chief Complaint: Sharon [name] is a 84 y.o. female with pancreatic cystic lesion who is seen for assessment after interval endoscopic biopsy.  HPI:   The patient was last seen in the medical oncology clinic on 01/29/2017 for initial consultation.  At that time, she had a pancreatic cystic lesion of unclear etiology. CA19-9 was 27 (normal).  She was referred for endoscopic ultrasound.  Labs revealed iron deficiency anemia and B12 deficiency. Ferritin was 124.  Iron saturation was 5% with a TIBC of 492.  Coombs was negative.  She has received B12 weekly x 4 (02/04/2017 - 02/13/201).  She has received Venofer (01/25, 02/01, and 02/20/2017).  She underwent endoscopic ultrasound on 02/18/2017 by Dr. Robin Searing. Cystic lesion in patient's pancreas felt to be an intraductal papillary mucinous neoplasm (IPMN). Dr. Cephas Darby is planning to refer patient to a surgeon at [facility] for resection.   Patient was scheduled to see vein and vascular. She was seen by a PA. She has been scheduled for testing and follow up with Dr. Lucky Cowboy on 03/20/2017.  Symptomatically, patient is feeling "ok". Following the iron infusions and B12 injections, patient notes that she feels some better. She denies physical complaints today. Patient denies B symptoms and interval infections.  She is eating well. Her weight remains stable. Patient denies pain in the clinic today.    Past Medical History:  Diagnosis Date  . Anemia   . Atrial fibrillation ([place])   . Cardiac arrest ([place])   . Cataract   . CHF (congestive heart failure) ([place])   . Diabetes mellitus without complication ([place])   . Edema    feet/ankles occas  . Hip fracture ([place])   . Hyperlipidemia   . Hypertension   . Hypothyroid   . Neuropathy   . Osteomyelitis ([place])    left first metatarsal  . Stroke [facility])     Past Surgical History:  Procedure Laterality Date  . ACHILLES TENDON SURGERY  Left 02/08/2015   Procedure: ACHILLES LENGTHENING/KIDNER;  Surgeon: Albertine Patricia, DPM;  Location: [facility] ORS;  Service: Podiatry;  Laterality: Left;  . APPENDECTOMY    . CARDIAC CATHETERIZATION  08/25/13  . CATARACT EXTRACTION W/PHACO Left 07/20/2014   Procedure: CATARACT EXTRACTION PHACO AND INTRAOCULAR LENS PLACEMENT (IOC);  Surgeon: Leandrew Koyanagi, MD;  Location: [facility] ORS;  Service: Ophthalmology;  Laterality: Left;  [place]  1:18                 AP     23.6             CDE   9.69      lot #[phone]  . CHOLECYSTECTOMY    . CORONARY ANGIOPLASTY    . HALLUX VALGUS AKIN Left 02/08/2015   Procedure: HALLUX VALGUS AKIN/ KELLER;  Surgeon: Albertine Patricia, DPM;  Location: [facility] ORS;  Service: Podiatry;  Laterality: Left;  IVA with Local needs 1 hour for this case   . HEMIARTHROPLASTY HIP Right   . HEMIARTHROPLASTY HIP Left   . INCISION AND DRAINAGE Left 10/11/2014   Procedure: Removal of infected tibial sessmoid;  Surgeon: Albertine Patricia, DPM;  Location: [facility] ORS;  Service: Podiatry;  Laterality: Left;    Family History  Problem Relation Age of Onset  . Heart attack Mother     Social History:  reports that she quit smoking about 27 years ago. Her smoking use included cigarettes. She has a 3.50 pack-year smoking history.  she has never used smokeless tobacco. She reports that she does not drink alcohol or use drugs.  Patient is a former 1 pack per day smoker until age 63.  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  She have a driver Delorise Shiner).  The patient is accompanied by her son, [name], today.  Allergies:  Allergies  Allergen Reactions  . Carbamazepine Other (See Comments)  . Codeine Other (See Comments)    Other reaction(s): Other (See Comments) Patient states she did not feel herself when she took it. unknown  . Cymbalta [Duloxetine Hcl] Other (See Comments)    Confusion, disorientation  . Duloxetine     Other reaction(s): Other (See Comments) Confusion,  disorientation  . Lyrica [Pregabalin] Other (See Comments)    Patient and son states this makes the patient confused.    Current Medications: Current Outpatient Medications  Medication Sig Dispense Refill  . acetaminophen (TYLENOL) 500 MG tablet Take 500 mg by mouth every 4 (four) hours as needed for mild pain or moderate pain.    Marland Kitchen amiodarone (PACERONE) 200 MG tablet Take one tablet by mouth once daily for AFIB    . aspirin 81 MG tablet Take 81 mg by mouth daily.    . cholecalciferol (VITAMIN D) 1000 units tablet Take 1,000 Units by mouth daily.    . furosemide (LASIX) 20 MG tablet Take 20 mg by mouth daily.    Marland Kitchen gabapentin (NEURONTIN) 600 MG tablet 600 mg 4 (four) times daily. Take one tablet by mouth twice daily for neuropathy     . glimepiride (AMARYL) 2 MG tablet Take 2 mg by mouth daily with breakfast.    . GLIPIZIDE XL 2.5 MG 24 hr tablet Take 2.5 mg by mouth daily. Reported on 02/07/2015  2  . levothyroxine (SYNTHROID, LEVOTHROID) 112 MCG tablet Take one tablet by mouth once daily 30 minutes before breakfast once daily for hypothyroid    . lisinopril (PRINIVIL,ZESTRIL) 10 MG tablet Take 10 mg by mouth daily.    . metolazone (ZAROXOLYN) 2.5 MG tablet Take 2.5 mg by mouth every other day.    . Multiple Vitamins-Minerals (PRESERVISION AREDS 2+MULTI VIT PO) Take by mouth.    . simvastatin (ZOCOR) 40 MG tablet Take one tablet by mouth once daily at bedtime for cholesterol     No current facility-administered medications for this visit.    Facility-Administered Medications Ordered in Other Visits  Medication Dose Route Frequency Provider Last Rate Last Dose  . sodium chloride flush (NS) 0.9 % injection 10 mL  10 mL Intracatheter PRN Karen Kitchens, NP        Review of Systems:  GENERAL:  Feels "ok".  Feels "some better" after iron infusions.  No fevers or sweats. Weight stable. PERFORMANCE STATUS (ECOG):  2 HEENT:  Decreased vision in right eye.  No runny nose, sore throat, mouth  sores or tenderness. Lungs: No shortness of breath or cough.  No hemoptysis. Cardiac:  No chest pain, palpitations, orthopnea, or PND.  Lays on side or sleeps sitting up. GI:  Stomach "bothers me" at times/indigestion.  No nausea, vomiting, diarrhea, constipation, melena or hematochezia. GU:  No urgency, frequency, dysuria, or hematuria. Musculoskeletal:  No back pain.  No joint pain.  No muscle tenderness. Extremities:  Lower extremity swelling. No pain. Skin:  No rashes or skin changes. Neuro:  No headache, numbness or weakness, balance or coordination issues. Endocrine:  No diabetes, thyroid issues, hot flashes or night sweats. Psych:  No  mood changes, depression or anxiety. Pain:  No focal pain. Review of systems:  All other systems reviewed and found to be negative.  Physical Exam: Blood pressure (!) 179/79, pulse 74, temperature 98.1 F (36.7 C), temperature source Tympanic, resp. rate 20, weight 140 lb 9 oz (63.8 kg), SpO2 97 %. GENERAL:  Well developed, well nourished, woman sitting comfortably in the exam room in no acute distress.  She has a rolling walker at her side. MENTAL STATUS:  Alert and oriented to person, place and time. HEAD: Pearline Cables hair.  Normocephalic, atraumatic, face symmetric, no Cushingoid features. EYES:  Glasses. Green eyes.  Pupils equal round and reactive to light and accomodation.  No conjunctivitis or scleral icterus. ENT:  Oropharynx clear without lesion.  Tongue normal. Mucous membranes moist.  RESPIRATORY:  Clear to auscultation without rales, wheezes or rhonchi. CARDIOVASCULAR:  Regular rate and rhythm.  Murmur.  No rub or gallop. ABDOMEN:  Soft, non-tender, with active bowel sounds, and no hepatosplenomegaly.  No masses. SKIN:  Pale.  Ecchymosis.  Small eschars.  No rashes, ulcers or lesions. EXTREMITIES:  Left ankle edema.  No skin discoloration or tenderness.  No palpable cords. LYMPH NODES: No palpable cervical, supraclavicular, axillary or inguinal  adenopathy  NEUROLOGICAL: Unremarkable. PSYCH:  Appropriate.   No visits with results within 3 Day(s) from this visit.  Latest known visit with results is:  Orders Only on 02/17/2017  Component Date Value Ref Range Status  . Order Confirmation 02/17/2017    Final                   Value:ORDER PROCESSED BY BLOOD BANK Performed at [facility], [address] [place], [place] [address]     Assessment:  JEANETT ANTONOPOULOS is a 84 y.o. female with a pancreatic cystic lesion of unclear etiology.  CA19-9 was 27 on 01/29/2017.  Abdomen and pelvic CT on 01/21/2017 revealed a multilocular cystic lesion in the body of the pancreas with associated ductal dilatation throughout the tail of the pancreas where there was extensive side branch ectasia and numerous other smaller cystic lesions. Findings were concerning for potential primary pancreatic malignancy or potentially a large intraductal papillary mucinous neoplasm.  There was mildly enlarged hepatoduodenal ligament lymph node, nonspecific.  There was mild cardiomegaly, mild interstitial pulmonary edema, and small bilateral pleural effusions, suggestive of underlying congestive heart failure.  There was severe atherosclerosis of the immediate infrarenal abdominal aorta which demonstrated luminal narrowing to only 6 mm.  Endoscopic ultrasound on 02/18/2017 revealed a cystic lesion in the pancreas felt to be an intraductal papillary mucinous neoplasm (IPMN).   She has iron deficiency anemia and B12 deficiency.   CBC on 01/01/2017 revealed a hematocrit of 27.4, hemoglobin 7.9, MCV 78.1, platelets 335,0000, WBC 7000, and ANC 4910.  Labs on 01/09/2017 revealed a B12 of 230 (low).  Ferritin was 30.  Retic was 3.08%.  Normal studies included:  TSH, folate (6.3), creatinine (1.2), liver function tests. SPEP was negative on 05/28/2016.  She has received B12 weekly x 4 (02/04/2017 - 02/13/201).  She has received Venofer x 3 (02/06/2017 -  02/20/2017).  Right lower extremity duplex on 01/21/2017 revealed no evidence of DVT.  Symptomatically, patient feels "ok". She denies any acute physical concerns today. Exam reveals left ankle edema.  Plan: 1.  Discuss results of labs drawn at last visit.  Discuss management of iron deficiency and B12 defiency. 2.  Discuss results of endoscopic ultrasound.  Discuss referral to [facility] surgery  for management of likely IPMN. 3.  Follow up with Dr. Lucky Cowboy as scheduled.  4.  Follow up with Dr. Cephas Darby as already scheduled.  5.  Discuss lower extremity edema. Patient has TED hose at home, but does not wear them regularly.  6.  RTC on 03/04/2017 for labs (CBC with diff, ferritin, iron studies), and B12 injection.  7.  Will schedule RTC after surgery - will call for appointment.    Honor Loh, NP  03/03/2017, 10:42 AM   I saw and evaluated the patient, participating in the key portions of the service and reviewing pertinent diagnostic studies and records.  I reviewed the nurse practitioner's note and agree with the findings and the plan.  The assessment and plan were discussed with the patient.  Several questions were asked by the patient and answered.   Nolon Stalls, MD 03/03/2017,10:42 AM

## 2017-03-03 NOTE — Progress Notes (Signed)
Patient offers no complaints today. 

## 2017-03-04 ENCOUNTER — Inpatient Hospital Stay: Payer: PPO

## 2017-03-04 DIAGNOSIS — E871 Hypo-osmolality and hyponatremia: Secondary | ICD-10-CM | POA: Diagnosis not present

## 2017-03-04 DIAGNOSIS — G603 Idiopathic progressive neuropathy: Secondary | ICD-10-CM | POA: Diagnosis not present

## 2017-03-04 DIAGNOSIS — K869 Disease of pancreas, unspecified: Secondary | ICD-10-CM | POA: Diagnosis not present

## 2017-03-04 DIAGNOSIS — D500 Iron deficiency anemia secondary to blood loss (chronic): Secondary | ICD-10-CM | POA: Diagnosis not present

## 2017-03-04 DIAGNOSIS — E538 Deficiency of other specified B group vitamins: Secondary | ICD-10-CM

## 2017-03-04 DIAGNOSIS — D136 Benign neoplasm of pancreas: Secondary | ICD-10-CM | POA: Diagnosis not present

## 2017-03-04 DIAGNOSIS — K8689 Other specified diseases of pancreas: Secondary | ICD-10-CM

## 2017-03-04 LAB — CBC WITH DIFFERENTIAL/PLATELET
Basophils Absolute: 0.1 10*3/uL (ref 0–0.1)
Basophils Relative: 1 %
Eosinophils Absolute: 0.2 10*3/uL (ref 0–0.7)
Eosinophils Relative: 2 %
HCT: 33.9 % — ABNORMAL LOW (ref 35.0–47.0)
Hemoglobin: 10.8 g/dL — ABNORMAL LOW (ref 12.0–16.0)
Lymphocytes Relative: 10 %
Lymphs Abs: 1 10*3/uL (ref 1.0–3.6)
MCH: 25.1 pg — ABNORMAL LOW (ref 26.0–34.0)
MCHC: 31.7 g/dL — ABNORMAL LOW (ref 32.0–36.0)
MCV: 79.1 fL — ABNORMAL LOW (ref 80.0–100.0)
Monocytes Absolute: 0.8 10*3/uL (ref 0.2–0.9)
Monocytes Relative: 8 %
Neutro Abs: 7.8 10*3/uL — ABNORMAL HIGH (ref 1.4–6.5)
Neutrophils Relative %: 79 %
Platelets: 289 10*3/uL (ref 150–440)
RBC: 4.29 MIL/uL (ref 3.80–5.20)
RDW: 24.6 % — ABNORMAL HIGH (ref 11.5–14.5)
WBC: 9.9 10*3/uL (ref 3.6–11.0)

## 2017-03-04 LAB — FERRITIN: Ferritin: 125 ng/mL (ref 11–307)

## 2017-03-04 LAB — IRON AND TIBC
Iron: 24 ug/dL — ABNORMAL LOW (ref 28–170)
Saturation Ratios: 6 % — ABNORMAL LOW (ref 10.4–31.8)
TIBC: 395 ug/dL (ref 250–450)
UIBC: 371 ug/dL

## 2017-03-04 MED ORDER — CYANOCOBALAMIN 1000 MCG/ML IJ SOLN
1000.0000 ug | Freq: Once | INTRAMUSCULAR | Status: AC
  Administered 2017-03-04: 1000 ug via INTRAMUSCULAR

## 2017-03-11 ENCOUNTER — Inpatient Hospital Stay: Payer: PPO

## 2017-03-11 DIAGNOSIS — M7551 Bursitis of right shoulder: Secondary | ICD-10-CM | POA: Diagnosis not present

## 2017-03-11 DIAGNOSIS — K869 Disease of pancreas, unspecified: Secondary | ICD-10-CM | POA: Diagnosis not present

## 2017-03-11 DIAGNOSIS — D500 Iron deficiency anemia secondary to blood loss (chronic): Secondary | ICD-10-CM | POA: Diagnosis not present

## 2017-03-11 DIAGNOSIS — E538 Deficiency of other specified B group vitamins: Secondary | ICD-10-CM

## 2017-03-11 DIAGNOSIS — E1151 Type 2 diabetes mellitus with diabetic peripheral angiopathy without gangrene: Secondary | ICD-10-CM | POA: Diagnosis not present

## 2017-03-11 DIAGNOSIS — Z0000 Encounter for general adult medical examination without abnormal findings: Secondary | ICD-10-CM | POA: Diagnosis not present

## 2017-03-11 DIAGNOSIS — G603 Idiopathic progressive neuropathy: Secondary | ICD-10-CM | POA: Diagnosis not present

## 2017-03-11 MED ORDER — CYANOCOBALAMIN 1000 MCG/ML IJ SOLN
1000.0000 ug | Freq: Once | INTRAMUSCULAR | Status: AC
  Administered 2017-03-11: 1000 ug via INTRAMUSCULAR

## 2017-03-18 ENCOUNTER — Ambulatory Visit: Payer: PPO

## 2017-03-19 ENCOUNTER — Other Ambulatory Visit: Payer: Self-pay

## 2017-03-19 ENCOUNTER — Emergency Department: Payer: PPO

## 2017-03-19 ENCOUNTER — Inpatient Hospital Stay
Admission: EM | Admit: 2017-03-19 | Discharge: 2017-03-23 | DRG: 853 | Disposition: A | Discharge status: Skilled Nursing Facility | Payer: PPO | Attending: Internal Medicine | Admitting: Internal Medicine

## 2017-03-19 DIAGNOSIS — R6521 Severe sepsis with septic shock: Secondary | ICD-10-CM | POA: Diagnosis not present

## 2017-03-19 DIAGNOSIS — N179 Acute kidney failure, unspecified: Secondary | ICD-10-CM | POA: Diagnosis not present

## 2017-03-19 DIAGNOSIS — N183 Chronic kidney disease, stage 3 unspecified: Secondary | ICD-10-CM | POA: Diagnosis present

## 2017-03-19 DIAGNOSIS — T380X5A Adverse effect of glucocorticoids and synthetic analogues, initial encounter: Secondary | ICD-10-CM | POA: Diagnosis not present

## 2017-03-19 DIAGNOSIS — R0602 Shortness of breath: Secondary | ICD-10-CM | POA: Diagnosis not present

## 2017-03-19 DIAGNOSIS — M869 Osteomyelitis, unspecified: Secondary | ICD-10-CM | POA: Diagnosis present

## 2017-03-19 DIAGNOSIS — E1122 Type 2 diabetes mellitus with diabetic chronic kidney disease: Secondary | ICD-10-CM | POA: Diagnosis present

## 2017-03-19 DIAGNOSIS — M7989 Other specified soft tissue disorders: Secondary | ICD-10-CM | POA: Diagnosis not present

## 2017-03-19 DIAGNOSIS — E1169 Type 2 diabetes mellitus with other specified complication: Secondary | ICD-10-CM | POA: Diagnosis not present

## 2017-03-19 DIAGNOSIS — L03115 Cellulitis of right lower limb: Secondary | ICD-10-CM | POA: Diagnosis present

## 2017-03-19 DIAGNOSIS — I248 Other forms of acute ischemic heart disease: Secondary | ICD-10-CM | POA: Diagnosis not present

## 2017-03-19 DIAGNOSIS — L02611 Cutaneous abscess of right foot: Secondary | ICD-10-CM | POA: Diagnosis present

## 2017-03-19 DIAGNOSIS — D509 Iron deficiency anemia, unspecified: Secondary | ICD-10-CM | POA: Diagnosis not present

## 2017-03-19 DIAGNOSIS — Z87891 Personal history of nicotine dependence: Secondary | ICD-10-CM | POA: Diagnosis not present

## 2017-03-19 DIAGNOSIS — I10 Essential (primary) hypertension: Secondary | ICD-10-CM | POA: Diagnosis present

## 2017-03-19 DIAGNOSIS — E785 Hyperlipidemia, unspecified: Secondary | ICD-10-CM | POA: Diagnosis present

## 2017-03-19 DIAGNOSIS — L089 Local infection of the skin and subcutaneous tissue, unspecified: Secondary | ICD-10-CM | POA: Diagnosis not present

## 2017-03-19 DIAGNOSIS — E1165 Type 2 diabetes mellitus with hyperglycemia: Secondary | ICD-10-CM | POA: Diagnosis not present

## 2017-03-19 DIAGNOSIS — E871 Hypo-osmolality and hyponatremia: Secondary | ICD-10-CM | POA: Diagnosis not present

## 2017-03-19 DIAGNOSIS — Z9049 Acquired absence of other specified parts of digestive tract: Secondary | ICD-10-CM

## 2017-03-19 DIAGNOSIS — I509 Heart failure, unspecified: Secondary | ICD-10-CM

## 2017-03-19 DIAGNOSIS — H3530 Unspecified macular degeneration: Secondary | ICD-10-CM | POA: Diagnosis not present

## 2017-03-19 DIAGNOSIS — R410 Disorientation, unspecified: Secondary | ICD-10-CM | POA: Diagnosis not present

## 2017-03-19 DIAGNOSIS — E039 Hypothyroidism, unspecified: Secondary | ICD-10-CM | POA: Diagnosis not present

## 2017-03-19 DIAGNOSIS — E11621 Type 2 diabetes mellitus with foot ulcer: Secondary | ICD-10-CM | POA: Diagnosis not present

## 2017-03-19 DIAGNOSIS — I130 Hypertensive heart and chronic kidney disease with heart failure and stage 1 through stage 4 chronic kidney disease, or unspecified chronic kidney disease: Secondary | ICD-10-CM | POA: Diagnosis present

## 2017-03-19 DIAGNOSIS — A480 Gas gangrene: Secondary | ICD-10-CM | POA: Diagnosis not present

## 2017-03-19 DIAGNOSIS — E1142 Type 2 diabetes mellitus with diabetic polyneuropathy: Secondary | ICD-10-CM | POA: Diagnosis not present

## 2017-03-19 DIAGNOSIS — I480 Paroxysmal atrial fibrillation: Secondary | ICD-10-CM | POA: Diagnosis not present

## 2017-03-19 DIAGNOSIS — E1140 Type 2 diabetes mellitus with diabetic neuropathy, unspecified: Secondary | ICD-10-CM | POA: Diagnosis present

## 2017-03-19 DIAGNOSIS — Z7401 Bed confinement status: Secondary | ICD-10-CM | POA: Diagnosis not present

## 2017-03-19 DIAGNOSIS — Z66 Do not resuscitate: Secondary | ICD-10-CM | POA: Diagnosis not present

## 2017-03-19 DIAGNOSIS — R6520 Severe sepsis without septic shock: Secondary | ICD-10-CM | POA: Diagnosis not present

## 2017-03-19 DIAGNOSIS — I350 Nonrheumatic aortic (valve) stenosis: Secondary | ICD-10-CM | POA: Diagnosis present

## 2017-03-19 DIAGNOSIS — R739 Hyperglycemia, unspecified: Secondary | ICD-10-CM | POA: Diagnosis not present

## 2017-03-19 DIAGNOSIS — Z4781 Encounter for orthopedic aftercare following surgical amputation: Secondary | ICD-10-CM | POA: Diagnosis not present

## 2017-03-19 DIAGNOSIS — M6281 Muscle weakness (generalized): Secondary | ICD-10-CM | POA: Diagnosis not present

## 2017-03-19 DIAGNOSIS — Z794 Long term (current) use of insulin: Secondary | ICD-10-CM

## 2017-03-19 DIAGNOSIS — I739 Peripheral vascular disease, unspecified: Secondary | ICD-10-CM | POA: Diagnosis present

## 2017-03-19 DIAGNOSIS — K5900 Constipation, unspecified: Secondary | ICD-10-CM | POA: Diagnosis not present

## 2017-03-19 DIAGNOSIS — R778 Other specified abnormalities of plasma proteins: Secondary | ICD-10-CM

## 2017-03-19 DIAGNOSIS — E11628 Type 2 diabetes mellitus with other skin complications: Secondary | ICD-10-CM | POA: Diagnosis not present

## 2017-03-19 DIAGNOSIS — B370 Candidal stomatitis: Secondary | ICD-10-CM | POA: Diagnosis not present

## 2017-03-19 DIAGNOSIS — I2510 Atherosclerotic heart disease of native coronary artery without angina pectoris: Secondary | ICD-10-CM | POA: Diagnosis not present

## 2017-03-19 DIAGNOSIS — E876 Hypokalemia: Secondary | ICD-10-CM

## 2017-03-19 DIAGNOSIS — R918 Other nonspecific abnormal finding of lung field: Secondary | ICD-10-CM | POA: Diagnosis not present

## 2017-03-19 DIAGNOSIS — R262 Difficulty in walking, not elsewhere classified: Secondary | ICD-10-CM | POA: Diagnosis not present

## 2017-03-19 DIAGNOSIS — Z885 Allergy status to narcotic agent status: Secondary | ICD-10-CM

## 2017-03-19 DIAGNOSIS — Z8673 Personal history of transient ischemic attack (TIA), and cerebral infarction without residual deficits: Secondary | ICD-10-CM

## 2017-03-19 DIAGNOSIS — IMO0002 Reserved for concepts with insufficient information to code with codable children: Secondary | ICD-10-CM | POA: Diagnosis present

## 2017-03-19 DIAGNOSIS — I5032 Chronic diastolic (congestive) heart failure: Secondary | ICD-10-CM | POA: Diagnosis present

## 2017-03-19 DIAGNOSIS — Z96643 Presence of artificial hip joint, bilateral: Secondary | ICD-10-CM | POA: Diagnosis present

## 2017-03-19 DIAGNOSIS — Z89411 Acquired absence of right great toe: Secondary | ICD-10-CM | POA: Diagnosis not present

## 2017-03-19 DIAGNOSIS — E119 Type 2 diabetes mellitus without complications: Secondary | ICD-10-CM | POA: Diagnosis not present

## 2017-03-19 DIAGNOSIS — Z8674 Personal history of sudden cardiac arrest: Secondary | ICD-10-CM

## 2017-03-19 DIAGNOSIS — Z7982 Long term (current) use of aspirin: Secondary | ICD-10-CM | POA: Diagnosis not present

## 2017-03-19 DIAGNOSIS — A419 Sepsis, unspecified organism: Principal | ICD-10-CM | POA: Diagnosis present

## 2017-03-19 DIAGNOSIS — L97413 Non-pressure chronic ulcer of right heel and midfoot with necrosis of muscle: Secondary | ICD-10-CM | POA: Diagnosis not present

## 2017-03-19 DIAGNOSIS — Z79899 Other long term (current) drug therapy: Secondary | ICD-10-CM

## 2017-03-19 DIAGNOSIS — M86171 Other acute osteomyelitis, right ankle and foot: Secondary | ICD-10-CM | POA: Diagnosis not present

## 2017-03-19 DIAGNOSIS — Z8249 Family history of ischemic heart disease and other diseases of the circulatory system: Secondary | ICD-10-CM

## 2017-03-19 DIAGNOSIS — K219 Gastro-esophageal reflux disease without esophagitis: Secondary | ICD-10-CM | POA: Diagnosis present

## 2017-03-19 DIAGNOSIS — Z961 Presence of intraocular lens: Secondary | ICD-10-CM | POA: Diagnosis present

## 2017-03-19 DIAGNOSIS — Z79890 Hormone replacement therapy (postmenopausal): Secondary | ICD-10-CM

## 2017-03-19 DIAGNOSIS — M86671 Other chronic osteomyelitis, right ankle and foot: Secondary | ICD-10-CM | POA: Diagnosis not present

## 2017-03-19 DIAGNOSIS — R7989 Other specified abnormal findings of blood chemistry: Secondary | ICD-10-CM

## 2017-03-19 DIAGNOSIS — M86179 Other acute osteomyelitis, unspecified ankle and foot: Secondary | ICD-10-CM | POA: Diagnosis not present

## 2017-03-19 DIAGNOSIS — Z9181 History of falling: Secondary | ICD-10-CM | POA: Diagnosis not present

## 2017-03-19 DIAGNOSIS — D638 Anemia in other chronic diseases classified elsewhere: Secondary | ICD-10-CM | POA: Diagnosis present

## 2017-03-19 DIAGNOSIS — E1151 Type 2 diabetes mellitus with diabetic peripheral angiopathy without gangrene: Secondary | ICD-10-CM | POA: Diagnosis not present

## 2017-03-19 DIAGNOSIS — Z888 Allergy status to other drugs, medicaments and biological substances status: Secondary | ICD-10-CM

## 2017-03-19 DIAGNOSIS — Z7984 Long term (current) use of oral hypoglycemic drugs: Secondary | ICD-10-CM

## 2017-03-19 DIAGNOSIS — Z8631 Personal history of diabetic foot ulcer: Secondary | ICD-10-CM | POA: Diagnosis not present

## 2017-03-19 DIAGNOSIS — I96 Gangrene, not elsewhere classified: Secondary | ICD-10-CM | POA: Diagnosis not present

## 2017-03-19 LAB — URINALYSIS, COMPLETE (UACMP) WITH MICROSCOPIC
BILIRUBIN URINE: NEGATIVE
Glucose, UA: 150 mg/dL — AB
KETONES UR: NEGATIVE mg/dL
Leukocytes, UA: NEGATIVE
Nitrite: NEGATIVE
PH: 5 (ref 5.0–8.0)
Protein, ur: >=300 mg/dL — AB
Specific Gravity, Urine: 1.012 (ref 1.005–1.030)

## 2017-03-19 LAB — CBC WITH DIFFERENTIAL/PLATELET
BASOS ABS: 0 10*3/uL (ref 0–0.1)
BASOS PCT: 0 %
Eosinophils Absolute: 0 10*3/uL (ref 0–0.7)
Eosinophils Relative: 0 %
HEMATOCRIT: 30.5 % — AB (ref 35.0–47.0)
HEMOGLOBIN: 9.7 g/dL — AB (ref 12.0–16.0)
Lymphocytes Relative: 2 %
Lymphs Abs: 0.3 10*3/uL — ABNORMAL LOW (ref 1.0–3.6)
MCH: 24.2 pg — ABNORMAL LOW (ref 26.0–34.0)
MCHC: 32 g/dL (ref 32.0–36.0)
MCV: 75.8 fL — ABNORMAL LOW (ref 80.0–100.0)
Monocytes Absolute: 1.3 10*3/uL — ABNORMAL HIGH (ref 0.2–0.9)
Monocytes Relative: 8 %
NEUTROS ABS: 15.7 10*3/uL — AB (ref 1.4–6.5)
NEUTROS PCT: 90 %
Platelets: 277 10*3/uL (ref 150–440)
RBC: 4.02 MIL/uL (ref 3.80–5.20)
RDW: 22.7 % — ABNORMAL HIGH (ref 11.5–14.5)
WBC: 17.4 10*3/uL — ABNORMAL HIGH (ref 3.6–11.0)

## 2017-03-19 LAB — TROPONIN I: Troponin I: 0.12 ng/mL (ref <0.03)

## 2017-03-19 LAB — INFLUENZA PANEL BY PCR (TYPE A & B)
Influenza A By PCR: NEGATIVE
Influenza B By PCR: NEGATIVE

## 2017-03-19 LAB — LACTIC ACID, PLASMA: LACTIC ACID, VENOUS: 1.5 mmol/L (ref 0.5–1.9)

## 2017-03-19 LAB — COMPREHENSIVE METABOLIC PANEL
ALK PHOS: 76 U/L (ref 38–126)
ALT: 14 U/L (ref 14–54)
ANION GAP: 13 (ref 5–15)
AST: 33 U/L (ref 15–41)
Albumin: 2.7 g/dL — ABNORMAL LOW (ref 3.5–5.0)
BILIRUBIN TOTAL: 0.7 mg/dL (ref 0.3–1.2)
BUN: 44 mg/dL — ABNORMAL HIGH (ref 6–20)
CALCIUM: 8.1 mg/dL — AB (ref 8.9–10.3)
CO2: 24 mmol/L (ref 22–32)
Chloride: 91 mmol/L — ABNORMAL LOW (ref 101–111)
Creatinine, Ser: 1.56 mg/dL — ABNORMAL HIGH (ref 0.44–1.00)
GFR calc Af Amer: 35 mL/min — ABNORMAL LOW (ref >60)
GFR calc non Af Amer: 30 mL/min — ABNORMAL LOW (ref >60)
Glucose, Bld: 343 mg/dL — ABNORMAL HIGH (ref 65–99)
Potassium: 2.7 mmol/L — CL (ref 3.5–5.1)
SODIUM: 128 mmol/L — AB (ref 135–145)
TOTAL PROTEIN: 6.6 g/dL (ref 6.5–8.1)

## 2017-03-19 LAB — PROTIME-INR
INR: 1.13
PROTHROMBIN TIME: 14.4 s (ref 11.4–15.2)

## 2017-03-19 LAB — PROCALCITONIN: Procalcitonin: 0.95 ng/mL

## 2017-03-19 LAB — LIPASE, BLOOD: Lipase: 25 U/L (ref 11–51)

## 2017-03-19 MED ORDER — ACETAMINOPHEN 500 MG PO TABS
1000.0000 mg | ORAL_TABLET | Freq: Once | ORAL | Status: AC
  Administered 2017-03-19: 1000 mg via ORAL
  Filled 2017-03-19: qty 2

## 2017-03-19 MED ORDER — MORPHINE SULFATE (PF) 4 MG/ML IV SOLN
4.0000 mg | Freq: Once | INTRAVENOUS | Status: AC
  Administered 2017-03-19: 4 mg via INTRAVENOUS

## 2017-03-19 MED ORDER — VANCOMYCIN HCL IN DEXTROSE 1-5 GM/200ML-% IV SOLN
1000.0000 mg | Freq: Once | INTRAVENOUS | Status: AC
  Administered 2017-03-19: 1000 mg via INTRAVENOUS
  Filled 2017-03-19: qty 200

## 2017-03-19 MED ORDER — SODIUM CHLORIDE 0.9 % IV BOLUS (SEPSIS)
1000.0000 mL | Freq: Once | INTRAVENOUS | Status: AC
  Administered 2017-03-19: 1000 mL via INTRAVENOUS

## 2017-03-19 MED ORDER — ONDANSETRON HCL 4 MG/2ML IJ SOLN
4.0000 mg | Freq: Once | INTRAMUSCULAR | Status: DC
  Filled 2017-03-19: qty 2

## 2017-03-19 MED ORDER — SODIUM CHLORIDE 0.9 % IV BOLUS (SEPSIS)
1800.0000 mL | Freq: Once | INTRAVENOUS | Status: AC
  Administered 2017-03-19: 1800 mL via INTRAVENOUS

## 2017-03-19 MED ORDER — SODIUM CHLORIDE 0.9 % IV SOLN
2.0000 g | INTRAVENOUS | Status: DC
  Administered 2017-03-20: 2 g via INTRAVENOUS
  Filled 2017-03-19 (×2): qty 2

## 2017-03-19 MED ORDER — POTASSIUM CHLORIDE CRYS ER 20 MEQ PO TBCR
80.0000 meq | EXTENDED_RELEASE_TABLET | Freq: Once | ORAL | Status: AC
  Administered 2017-03-19: 80 meq via ORAL
  Filled 2017-03-19: qty 4

## 2017-03-19 MED ORDER — MORPHINE SULFATE (PF) 4 MG/ML IV SOLN
INTRAVENOUS | Status: AC
  Filled 2017-03-19: qty 1

## 2017-03-19 MED ORDER — MAGNESIUM SULFATE 2 GM/50ML IV SOLN
2.0000 g | Freq: Once | INTRAVENOUS | Status: AC
  Administered 2017-03-19: 2 g via INTRAVENOUS
  Filled 2017-03-19: qty 50

## 2017-03-19 MED ORDER — SODIUM CHLORIDE 0.9 % IV SOLN
1.0000 g | Freq: Once | INTRAVENOUS | Status: AC
  Administered 2017-03-19: 1 g via INTRAVENOUS
  Filled 2017-03-19: qty 10

## 2017-03-19 NOTE — ED Notes (Signed)
Date and time results received: 03/19/17 (use smartphrase ".now" to insert current time)  Test: potassium, troponin Critical Value: 2.7, 0.12  Name of Provider Notified: [email or web address]   Orders Received? Or Actions Taken?: MD notified

## 2017-03-19 NOTE — ED Provider Notes (Signed)
[facility] Emergency Department Provider Note  ____________________________________________   First MD Initiated Contact with Patient 03/19/17 1932     (approximate)  I have reviewed the triage vital signs and the nursing notes.   HISTORY  Chief Complaint Drug Overdose  Level 5 exemption history limited by the patient's clinical condition  HPI Sharon [name] is a 84 y.o. female who sent to the emergency department via EMS after possible drug overdose.  Her caretaker at home noted that the patient has been confused for the past 24 hours or so and when she checked the patient's medications she became concerned that she was missing several tablets of temazepam as well as trazodone.  The patient herself has no complaints and actually tried to refuse EMS transport.  Past Medical History:  Diagnosis Date  . Anemia   . Atrial fibrillation ([place])   . Cardiac arrest ([place])   . Cataract   . CHF (congestive heart failure) ([place])   . Diabetes mellitus without complication ([place])   . Edema    feet/ankles occas  . Hip fracture ([place])   . Hyperlipidemia   . Hypertension   . Hypothyroid   . Neuropathy   . Osteomyelitis ([place])    left first metatarsal  . Stroke [facility])     Patient Active Problem List   Diagnosis Date Noted  . Sepsis ([place]) 03/19/2017  . CKD (chronic kidney disease), stage III ([place]) 03/19/2017  . Goals of care, counseling/discussion 03/03/2017  . PAD (peripheral artery disease) ([place]) 02/03/2017  . Abdominal aortic stenosis 02/03/2017  . Bilateral lower extremity edema 02/03/2017  . Anemia 01/29/2017  . B12 deficiency 01/29/2017  . Pancreatic mass 01/29/2017  . Hyponatremia 07/07/2015  . Foot ulcer ([place]) 02/07/2015  . ARF (acute renal failure) ([place]) 11/05/2014  . Diabetic foot infection ([place]) 10/10/2014  . Chronic diastolic heart failure ([place]) 05/31/2014  . HTN (hypertension) 05/31/2014  . DM (diabetes mellitus), type 2, uncontrolled ([place])  05/31/2014    Past Surgical History:  Procedure Laterality Date  . ACHILLES TENDON SURGERY Left 02/08/2015   Procedure: ACHILLES LENGTHENING/KIDNER;  Surgeon: Albertine Patricia, DPM;  Location: [facility] ORS;  Service: Podiatry;  Laterality: Left;  . APPENDECTOMY    . CARDIAC CATHETERIZATION  08/25/13  . CATARACT EXTRACTION W/PHACO Left 07/20/2014   Procedure: CATARACT EXTRACTION PHACO AND INTRAOCULAR LENS PLACEMENT (IOC);  Surgeon: Leandrew Koyanagi, MD;  Location: [facility] ORS;  Service: Ophthalmology;  Laterality: Left;  [place]  1:18                 AP     23.6             CDE   9.69      lot #[phone]  . CHOLECYSTECTOMY    . CORONARY ANGIOPLASTY    . HALLUX VALGUS AKIN Left 02/08/2015   Procedure: HALLUX VALGUS AKIN/ KELLER;  Surgeon: Albertine Patricia, DPM;  Location: [facility] ORS;  Service: Podiatry;  Laterality: Left;  IVA with Local needs 1 hour for this case   . HEMIARTHROPLASTY HIP Right   . HEMIARTHROPLASTY HIP Left   . INCISION AND DRAINAGE Left 10/11/2014   Procedure: Removal of infected tibial sessmoid;  Surgeon: Albertine Patricia, DPM;  Location: [facility] ORS;  Service: Podiatry;  Laterality: Left;    Prior to Admission medications   Medication Sig Start Date End Date Taking? Authorizing Provider  acetaminophen (TYLENOL) 500 MG tablet Take 500 mg by mouth every 4 (four) hours as needed for mild  pain or moderate pain.    [provider]  amiodarone (PACERONE) 200 MG tablet Take one tablet by mouth once daily for AFIB    [provider]  aspirin 81 MG tablet Take 81 mg by mouth daily.    [provider]  cholecalciferol (VITAMIN D) 1000 units tablet Take 1,000 Units by mouth daily.    [provider]  furosemide (LASIX) 20 MG tablet Take 20 mg by mouth daily.    [provider]  gabapentin (NEURONTIN) 600 MG tablet 600 mg 4 (four) times daily. Take one tablet by mouth twice daily for neuropathy     [provider]  glimepiride (AMARYL) 2 MG tablet  Take 2 mg by mouth daily with breakfast.    [provider]  GLIPIZIDE XL 2.5 MG 24 hr tablet Take 2.5 mg by mouth daily. Reported on 02/07/2015 10/14/14   [provider]  levothyroxine (SYNTHROID, LEVOTHROID) 112 MCG tablet Take one tablet by mouth once daily 30 minutes before breakfast once daily for hypothyroid    [provider]  lisinopril (PRINIVIL,ZESTRIL) 10 MG tablet Take 10 mg by mouth daily.    [provider]  metolazone (ZAROXOLYN) 2.5 MG tablet Take 2.5 mg by mouth every other day.    [provider]  Multiple Vitamins-Minerals (PRESERVISION AREDS 2+MULTI VIT PO) Take by mouth.    [provider]  simvastatin (ZOCOR) 40 MG tablet Take one tablet by mouth once daily at bedtime for cholesterol    [provider]    Allergies Carbamazepine; Codeine; Cymbalta [duloxetine hcl]; Duloxetine; and Lyrica [pregabalin]  Family History  Problem Relation Age of Onset  . Heart attack Mother     Social History Social History   Tobacco Use  . Smoking status: Former Smoker    Packs/day: 0.50    Years: 7.00    Pack years: 3.50    Types: Cigarettes    Last attempt to quit: 05/30/1989    Years since quitting: 27.8  . Smokeless tobacco: Never Used  Substance Use Topics  . Alcohol use: No    Alcohol/week: 0.0 oz  . Drug use: No    Review of Systems Level 5 exemption history limited by the patient's clinical condition  ____________________________________________   PHYSICAL EXAM:  VITAL SIGNS: ED Triage Vitals  Enc Vitals Group     BP      Pulse      Resp      Temp      Temp src      SpO2      Weight      Height      Head Circumference      Peak Flow      Pain Score      Pain Loc      Pain Edu?      Excl. in [place]?     Constitutional: Slightly elevated respiratory rate appears somewhat confused no diaphoresis Eyes: PERRL EOMI. Head: Atraumatic. Nose: No congestion/rhinnorhea. Mouth/Throat: No  trismus Neck: No stridor.   Cardiovascular: Tachycardic rate, regular rhythm. Grossly normal heart sounds.  Good peripheral circulation. Respiratory: Increased respiratory effort.  No retractions. Lungs CTAB and moving good air Gastrointestinal: Soft nontender Musculoskeletal: Right lower extremity swelling greater than left lower extremity with erythema.  Ulcers in bilateral bases right plantar surface ulcer to the ball the foot probes deeply 2+ dorsalis pedis pulses bilaterally Neurologic:  Normal speech and language. No gross focal neurologic deficits are appreciated. Skin:  Skin is warm, dry and intact. No rash noted. Psychiatric: Mood and affect are normal. Speech and behavior are normal.    ____________________________________________   DIFFERENTIAL includes but not limited to  Sepsis, influenza, urinary tract infection, bacteremia, intracerebral hemorrhage ____________________________________________   LABS (all labs ordered are listed, but only abnormal results are displayed)  Labs Reviewed  COMPREHENSIVE METABOLIC PANEL - Abnormal; Notable for the following components:      Result Value   Sodium 128 (*)    Potassium 2.7 (*)    Chloride 91 (*)    Glucose, Bld 343 (*)    BUN 44 (*)    Creatinine, Ser 1.56 (*)    Calcium 8.1 (*)    Albumin 2.7 (*)    GFR calc non Af Amer 30 (*)    GFR calc Af Amer 35 (*)    All other components within normal limits  CBC WITH DIFFERENTIAL/PLATELET - Abnormal; Notable for the following components:   WBC 17.4 (*)    Hemoglobin 9.7 (*)    HCT 30.5 (*)    MCV 75.8 (*)    MCH 24.2 (*)    RDW 22.7 (*)    Neutro Abs 15.7 (*)    Lymphs Abs 0.3 (*)    Monocytes Absolute 1.3 (*)    All other components within normal limits  TROPONIN I - Abnormal; Notable for the following components:   Troponin I 0.12 (*)    All other components within normal limits  URINALYSIS, COMPLETE (UACMP) WITH MICROSCOPIC - Abnormal; Notable for the following  components:   Color, Urine YELLOW (*)    APPearance HAZY (*)    Glucose, UA 150 (*)    Hgb urine dipstick SMALL (*)    Protein, ur >=300 (*)    Bacteria, UA RARE (*)    Squamous Epithelial / LPF 0-5 (*)    All other components within normal limits  CULTURE, BLOOD (ROUTINE X 2)  CULTURE, BLOOD (ROUTINE X 2)  URINE CULTURE  INFLUENZA PANEL BY PCR (TYPE A & B)  LACTIC ACID, PLASMA  LIPASE, BLOOD  PROCALCITONIN  PROTIME-INR  LACTIC ACID, PLASMA    Lab work reviewed by me with a number of abnormalities most concerning elevated white count concerning for infection.  Elevated troponin likely secondary to demand.  Elevated creatinine concerning for acute kidney injury __________________________________________  EKG  ED ECG REPORT I, Darel Hong, the attending physician, personally viewed and interpreted this ECG.  Date: 03/19/2017 EKG Time:  Rate: 108 Rhythm: Sinus tachycardia QRS Axis: Leftward axis Intervals: normal ST/T Wave abnormalities: normal Narrative Interpretation: no evidence of acute ischemia  ____________________________________________  RADIOLOGY  Head CT reviewed by me with no acute disease Chest x-ray reviewed by me with no acute disease Foot x-ray reviewed by me with no evidence of osteomyelitis ____________________________________________   PROCEDURES  Procedure(s) performed: no  .Critical Care Performed by: Darel Hong, MD Authorized by: Darel Hong, MD   Critical care provider statement:    Critical care time (minutes):  30   Critical care time was exclusive of:  Separately billable procedures and treating other patients   Critical care was necessary to treat or prevent imminent or life-threatening deterioration of the following conditions:  Sepsis and dehydration   Critical care was time spent personally by me on the following activities:  Development of treatment plan with patient or surrogate, discussions with consultants,  evaluation of patient's response to treatment, examination of patient, obtaining history from patient or surrogate, ordering  and performing treatments and interventions, ordering and review of laboratory studies, ordering and review of radiographic studies, pulse oximetry, re-evaluation of patient's condition and review of old [place] performed: Yes  Observation: no ____________________________________________   INITIAL IMPRESSION / ASSESSMENT AND PLAN / ED COURSE  Pertinent labs & imaging results that were available during my care of the patient were reviewed by me and considered in my medical decision making (see chart for details).  The patient arrives febrile and tachycardic and somewhat confused.  Differential is broad but includes sepsis as well as ingestion.  Head CT will be obtained as apparently the patient fell yesterday.     ----------------------------------------- 9:10 PM on 03/19/2017 -----------------------------------------  Prior to in and out catheterization I was notified by nursing that the patient had erythematous and edematous feet.  On further evaluation her right greater than left foot and ankle is swollen with ulcers on the plantar surface.  The ulcer on the right plantar surface on the ball of foot is open and probes rather deeply.  This raises concern for diabetic foot infection and osteomyelitis.  At this point the patient requires inpatient admission for IV fluid resuscitation as well as IV antibiotics and continued treatment of her sepsis.  I discussed with the patient and family who verbalized understanding and agreement the plan.  I then discussed with Dr. Jannifer Franklin who has graciously agreed to admit the patient to his service. ____________________________________________   FINAL CLINICAL IMPRESSION(S) / ED DIAGNOSES  Final diagnoses:  Sepsis, due to unspecified organism [facility])      NEW MEDICATIONS STARTED DURING THIS VISIT:  New  Prescriptions   No medications on file     Note:  This document was prepared using Dragon voice recognition software and may include unintentional dictation errors.     Darel Hong, MD 03/19/17 2149

## 2017-03-19 NOTE — ED Notes (Signed)
Admitting MD aware of pressure and instructed RN to administer morphine at this time.

## 2017-03-19 NOTE — ED Triage Notes (Signed)
Pt arrives via ems from home with reports of a possible overdose. EMS reports pt states she has taken all of her medications. However, ems provided pill packet and states pt has not taken morning, noon, or nightly meds . Ems also states pt caregiver keeps pt medications (trazadone, clonazepam) to prevent pt from taking too much. Pt does not appear to be in distress, O2 sat currently 90 but not labored breathing noted.

## 2017-03-19 NOTE — Progress Notes (Signed)
CODE SEPSIS - PHARMACY COMMUNICATION  **Broad Spectrum Antibiotics should be administered within 1 hour of Sepsis diagnosis**  Time Code Sepsis Called/Page Received: 20:17   Antibiotics Ordered: Ceftriaxone 1 gm IV X 1   Time of 1st antibiotic administration:   Additional action taken by pharmacy:   If necessary, Name of Provider/Nurse Contacted:     Elijio Staples D ,PharmD Clinical Pharmacist  03/19/2017  8:35 PM

## 2017-03-19 NOTE — Progress Notes (Addendum)
ANTIBIOTIC CONSULT NOTE - INITIAL  Pharmacy Consult for Cefepime and vancomycin Indication: sepsis  Allergies  Allergen Reactions  . Carbamazepine Other (See Comments)  . Codeine Other (See Comments)    Other reaction(s): Other (See Comments) Patient states she did not feel herself when she took it. unknown  . Cymbalta [Duloxetine Hcl] Other (See Comments)    Confusion, disorientation  . Duloxetine     Other reaction(s): Other (See Comments) Confusion, disorientation  . Lyrica [Pregabalin] Other (See Comments)    Patient and son states this makes the patient confused.    Patient Measurements: Height: 5\' 5"  (165.1 cm) Weight: 140 lb (63.5 kg) IBW/kg (Calculated) : 57 Adjusted Body Weight:   Vital Signs: Temp: 101.3 F (38.5 C) (03/07 1953) Temp Source: Oral (03/07 1953) BP: 177/67 (03/07 1953) Pulse Rate: 103 (03/07 1953) Intake/Output from previous day: No intake/output data recorded. Intake/Output from this shift: No intake/output data recorded.  Labs: Recent Labs    03/19/17 2018  WBC 17.4*  HGB 9.7*  PLT 277  CREATININE 1.56*   Estimated Creatinine Clearance: 25.5 mL/min (A) (by C-G formula based on SCr of 1.56 mg/dL (H)). No results for input(s): VANCOTROUGH, VANCOPEAK, VANCORANDOM, GENTTROUGH, GENTPEAK, GENTRANDOM, TOBRATROUGH, TOBRAPEAK, TOBRARND, AMIKACINPEAK, AMIKACINTROU, AMIKACIN in the last 72 hours.   Microbiology: No results found for this or any previous visit (from the past 720 hour(s)).  Medical History: Past Medical History:  Diagnosis Date  . Anemia   . Atrial fibrillation ([place])   . Cardiac arrest ([place])   . Cataract   . CHF (congestive heart failure) ([place])   . Diabetes mellitus without complication ([place])   . Edema    feet/ankles occas  . Hip fracture ([place])   . Hyperlipidemia   . Hypertension   . Hypothyroid   . Neuropathy   . Osteomyelitis ([place])    left first metatarsal  . Stroke ([place])     Medications:   (Not in a hospital  admission) Assessment: CrCl = 25.5 ml/min   Goal of Therapy:  resolution of infection   Plan:  Expected duration 7 days with resolution of temperature and/or normalization of WBC   Cefepime 2 gm IV Q24H ordered to start on 3/7 @ 22:00.   Robbins,Jason D 03/19/2017,9:51 PM    Vancomycin  DW 64kg  Vd 45l kei 0.026 hr-1  T1/2 27 hours Vancomycin 1 gram q 36 hours ordered with stacked dosing. Level before 4th dose. Goal trough 15-20.  Sim Boast, PharmD, BCPS  03/20/17 4:13 AM

## 2017-03-19 NOTE — H&P (Addendum)
[facility] NAME: Sharon [name]    MR#:  [id number]  DATE OF BIRTH:  [birth year]  DATE OF ADMISSION:  03/19/2017  PRIMARY CARE PHYSICIAN: Rusty Aus, MD   REQUESTING/REFERRING PHYSICIAN: Mable Paris, MD  CHIEF COMPLAINT:   Chief Complaint  Patient presents with  . Drug Overdose    HISTORY OF PRESENT ILLNESS:  Sharon [name]  is a 84 y.o. female who presents with confusion and lethargy.  Family states that the patient has been getting different medications from different doctors, including temazepam.  They were concerned that she had taken too much medication.  Here in the ED she meets sepsis criteria and clearly has a right foot diabetic ulcer with surrounding cellulitis.  Her lactic acid initially was okay, her blood pressure was initially stable but subsequently fell.  She was treated per sepsis protocol the ED and hospitalist were called for admission.  PAST MEDICAL HISTORY:   Past Medical History:  Diagnosis Date  . Anemia   . Atrial fibrillation ([place])   . Cardiac arrest ([place])   . Cataract   . CHF (congestive heart failure) ([place])   . Diabetes mellitus without complication ([place])   . Edema    feet/ankles occas  . Hip fracture ([place])   . Hyperlipidemia   . Hypertension   . Hypothyroid   . Neuropathy   . Osteomyelitis ([place])    left first metatarsal  . Stroke ([place])      PAST SURGICAL HISTORY:   Past Surgical History:  Procedure Laterality Date  . ACHILLES TENDON SURGERY Left 02/08/2015   Procedure: ACHILLES LENGTHENING/KIDNER;  Surgeon: Albertine Patricia, DPM;  Location: [facility] ORS;  Service: Podiatry;  Laterality: Left;  . APPENDECTOMY    . CARDIAC CATHETERIZATION  08/25/13  . CATARACT EXTRACTION W/PHACO Left 07/20/2014   Procedure: CATARACT EXTRACTION PHACO AND INTRAOCULAR LENS PLACEMENT (IOC);  Surgeon: Leandrew Koyanagi, MD;  Location: [facility] ORS;  Service: Ophthalmology;  Laterality: Left;  [place]  1:18                  AP     23.6             CDE   9.69      lot #[id number]  . CHOLECYSTECTOMY    . CORONARY ANGIOPLASTY    . HALLUX VALGUS AKIN Left 02/08/2015   Procedure: HALLUX VALGUS AKIN/ KELLER;  Surgeon: Albertine Patricia, DPM;  Location: [facility] ORS;  Service: Podiatry;  Laterality: Left;  IVA with Local needs 1 hour for this case   . HEMIARTHROPLASTY HIP Right   . HEMIARTHROPLASTY HIP Left   . INCISION AND DRAINAGE Left 10/11/2014   Procedure: Removal of infected tibial sessmoid;  Surgeon: Albertine Patricia, DPM;  Location: [facility] ORS;  Service: Podiatry;  Laterality: Left;     SOCIAL HISTORY:   Social History   Tobacco Use  . Smoking status: Former Smoker    Packs/day: 0.50    Years: 7.00    Pack years: 3.50    Types: Cigarettes    Last attempt to quit: 05/30/1989    Years since quitting: 27.8  . Smokeless tobacco: Never Used  Substance Use Topics  . Alcohol use: No    Alcohol/week: 0.0 oz     FAMILY HISTORY:   Family History  Problem Relation Age of Onset  . Heart attack Mother      DRUG ALLERGIES:   Allergies  Allergen Reactions  . Carbamazepine Other (  See Comments)  . Codeine Other (See Comments)    Other reaction(s): Other (See Comments) Patient states she did not feel herself when she took it. unknown  . Cymbalta [Duloxetine Hcl] Other (See Comments)    Confusion, disorientation  . Duloxetine     Other reaction(s): Other (See Comments) Confusion, disorientation  . Lyrica [Pregabalin] Other (See Comments)    Patient and son states this makes the patient confused.    MEDICATIONS AT HOME:   Prior to Admission medications   Medication Sig Start Date End Date Taking? Authorizing Provider  acetaminophen (TYLENOL) 500 MG tablet Take 500 mg by mouth every 4 (four) hours as needed for mild pain or moderate pain.    [provider]  amiodarone (PACERONE) 200 MG tablet Take one tablet by mouth once daily for AFIB    [provider]  aspirin 81 MG tablet  Take 81 mg by mouth daily.    [provider]  cholecalciferol (VITAMIN D) 1000 units tablet Take 1,000 Units by mouth daily.    [provider]  furosemide (LASIX) 20 MG tablet Take 20 mg by mouth daily.    [provider]  gabapentin (NEURONTIN) 600 MG tablet 600 mg 4 (four) times daily. Take one tablet by mouth twice daily for neuropathy     [provider]  glimepiride (AMARYL) 2 MG tablet Take 2 mg by mouth daily with breakfast.    [provider]  GLIPIZIDE XL 2.5 MG 24 hr tablet Take 2.5 mg by mouth daily. Reported on 02/07/2015 10/14/14   [provider]  levothyroxine (SYNTHROID, LEVOTHROID) 112 MCG tablet Take one tablet by mouth once daily 30 minutes before breakfast once daily for hypothyroid    [provider]  lisinopril (PRINIVIL,ZESTRIL) 10 MG tablet Take 10 mg by mouth daily.    [provider]  metolazone (ZAROXOLYN) 2.5 MG tablet Take 2.5 mg by mouth every other day.    [provider]  Multiple Vitamins-Minerals (PRESERVISION AREDS 2+MULTI VIT PO) Take by mouth.    [provider]  simvastatin (ZOCOR) 40 MG tablet Take one tablet by mouth once daily at bedtime for cholesterol    [provider]    REVIEW OF SYSTEMS:  Review of Systems  Constitutional: Positive for fever. Negative for chills, malaise/fatigue and weight loss.  HENT: Negative for ear pain, hearing loss and tinnitus.   Eyes: Negative for blurred vision, double vision, pain and redness.  Respiratory: Negative for cough, hemoptysis and shortness of breath.   Cardiovascular: Negative for chest pain, palpitations, orthopnea and leg swelling.  Gastrointestinal: Negative for abdominal pain, constipation, diarrhea, nausea and vomiting.  Genitourinary: Negative for dysuria, frequency and hematuria.  Musculoskeletal: Negative for back pain, joint pain and neck pain.  Skin:       Right foot erythema and ulcerated wound   Neurological: Negative for dizziness, tremors, focal weakness and weakness.  Endo/Heme/Allergies: Negative for polydipsia. Does not bruise/bleed easily.  Psychiatric/Behavioral: Negative for depression. The patient is not nervous/anxious and does not have insomnia.      VITAL SIGNS:   Vitals:   03/19/17 1953 03/19/17 1954  BP: (!) 177/67   Pulse: (!) 103   Resp: (!) 25   Temp: (!) 101.3 F (38.5 C)   TempSrc: Oral   SpO2: 93%   Weight:  63.5 kg (140 lb)  Height:  5\' 5"  (1.651 m)   Wt Readings from Last 3 Encounters:  03/19/17 63.5 kg (140 lb)  03/03/17 63.8 kg (140 lb 9 oz)  02/03/17 63.8 kg (140 lb 9.6 oz)    PHYSICAL EXAMINATION:  Physical Exam  Vitals reviewed. Constitutional: She is oriented to person, place, and time. She appears well-developed and well-nourished. No distress.  HENT:  Head: Normocephalic and atraumatic.  Mouth/Throat: Oropharynx is clear and moist.  Eyes: Conjunctivae and EOM are normal. Pupils are equal, round, and reactive to light. No scleral icterus.  Neck: Normal range of motion. Neck supple. No JVD present. No thyromegaly present.  Cardiovascular: Regular rhythm and intact distal pulses. Exam reveals no gallop and no friction rub.  No murmur heard. tachycardic  Respiratory: Effort normal and breath sounds normal. No respiratory distress. She has no wheezes. She has no rales.  GI: Soft. Bowel sounds are normal. She exhibits no distension. There is no tenderness.  Musculoskeletal: Normal range of motion. She exhibits no edema.  No arthritis, no gout  Lymphadenopathy:    She has no cervical adenopathy.  Neurological: She is alert and oriented to person, place, and time. No cranial nerve deficit.  No dysarthria, no aphasia  Skin: Skin is warm and dry. No rash noted. There is erythema (With warmth, and plantar ulcerated wound without drainage.).  Psychiatric: She has a normal mood and affect. Her behavior is normal. Judgment and thought content  normal.    LABORATORY PANEL:   CBC Recent Labs  Lab 03/19/17 2018  WBC 17.4*  HGB 9.7*  HCT 30.5*  PLT 277   ------------------------------------------------------------------------------------------------------------------  Chemistries  Recent Labs  Lab 03/19/17 2018  NA 128*  K 2.7*  CL 91*  CO2 24  GLUCOSE 343*  BUN 44*  CREATININE 1.56*  CALCIUM 8.1*  AST 33  ALT 14  ALKPHOS 76  BILITOT 0.7   ------------------------------------------------------------------------------------------------------------------  Cardiac Enzymes Recent Labs  Lab 03/19/17 2018  TROPONINI 0.12*   ------------------------------------------------------------------------------------------------------------------  RADIOLOGY:  Dg Chest 1 View  Result Date: 03/19/2017 CLINICAL DATA:  Short of breath EXAM: CHEST 1 VIEW COMPARISON:  10/15/2014 FINDINGS: Mild biapical scarring. No focal pulmonary opacity or pleural effusion. Stable cardiomediastinal silhouette with aortic atherosclerosis. No pneumothorax. Old right second rib fracture. IMPRESSION: No active disease. Electronically Signed   By: Donavan Foil M.D.   On: 03/19/2017 19:59   Dg Ankle Complete Right  Result Date: 03/19/2017 CLINICAL DATA:  Erythema. Evaluate for possible osteomyelitis. Personal history of left foot osteomyelitis in 2016. EXAM: RIGHT ANKLE - COMPLETE 3+ VIEW COMPARISON:  None. FINDINGS: Mild diffuse soft tissue swelling. Osseous demineralization. No evidence of acute fracture or dislocation. Ankle mortise intact with mild joint space narrowing. No evidence of osteomyelitis. IMPRESSION: 1. No acute osseous abnormality.  No evidence of osteomyelitis. 2. Osseous demineralization. 3. Mild osteoarthritis. Electronically Signed   By: Evangeline Dakin M.D.   On: 03/19/2017 21:21   Ct Head Wo Contrast  Result Date: 03/19/2017 CLINICAL DATA:  84 year old with progressively worsening confusion over the past 2 days. Patient fell  yesterday. Initial encounter. EXAM: CT HEAD WITHOUT CONTRAST TECHNIQUE: Contiguous axial images were obtained from the base of the skull through the vertex without intravenous contrast. COMPARISON:  12/02/2014, 11/05/2014. FINDINGS: Brain: Ventricular system normal in size and appearance for age. Mild age related cortical atrophy, unchanged. Moderate changes of small vessel disease of the white matter diffusely, unchanged. No mass lesion. No midline shift. No acute hemorrhage or hematoma. No extra-axial fluid collections. No evidence of acute infarction. Vascular: Mild bilateral carotid siphon and vertebral artery atherosclerosis. No hyperdense vessel. Skull: Marked  hyperostosis frontalis interna bilaterally. No skull fracture or other focal osseous abnormality involving the skull. Sinuses/Orbits: Visualized paranasal sinuses, bilateral mastoid air cells and bilateral middle ear cavities well-aerated. Visualized orbits and globes intact. Hyperdense lens involving the right globe, query cataract. Other: None. IMPRESSION: 1. No acute intracranial abnormality. 2. Stable mild age related cortical atrophy and stable moderate chronic microvascular ischemic changes of the white matter. Electronically Signed   By: Evangeline Dakin M.D.   On: 03/19/2017 20:11   Dg Foot Complete Left  Result Date: 03/19/2017 CLINICAL DATA:  Erythema. Evaluate for possible osteomyelitis. Personal history of left foot osteomyelitis in 2016. EXAM: LEFT FOOT - COMPLETE 3+ VIEW COMPARISON:  Left foot MRI 10/10/2014 left foot x-rays 02/23/2012. FINDINGS: No evidence of acute fracture or dislocation. Osseous demineralization. Hammertoe deformity. Likely remote healed fracture involving the proximal phalanx of the second toe. No evidence of osteomyelitis. Narrowing of the IP joint spaces of the toes and narrowing of joint spaces in the midfoot, unchanged. Small plantar calcaneal spur. Small enthesopathic spur at the insertion of the Achilles  tendon on the posterior calcaneus. IMPRESSION: 1. No acute osseous abnormality.  No evidence of osteomyelitis. 2. Osseous demineralization. 3. Mild osteoarthritis. Electronically Signed   By: Evangeline Dakin M.D.   On: 03/19/2017 21:20   Dg Foot Complete Right  Result Date: 03/19/2017 CLINICAL DATA:  Erythema. Evaluate for possible osteomyelitis. Personal history of left foot osteomyelitis in 2016. EXAM: RIGHT FOOT COMPLETE - 3+ VIEW COMPARISON:  None. FINDINGS: Soft tissue ulceration with gas in the tissues of the plantar surface underlying the head of the first metatarsal. No evidence of osteomyelitis. No evidence of acute fracture or dislocation. Osseous demineralization. Remote fracture involving the head of the third metatarsal with incomplete union. Hammertoe deformity. Joint space narrowing involving the IP joints of the toes and joints of the midfoot. IMPRESSION: 1. Soft tissue ulceration with gas in the tissues of the plantar surface of the foot underlying the head of the first metatarsal. No evidence of osteomyelitis. 2. Remote fracture involving the head of the third metatarsal with incomplete union. 3. Mild osteoarthritis. 4. Osseous demineralization. Electronically Signed   By: Evangeline Dakin M.D.   On: 03/19/2017 21:24    EKG:   Orders placed or performed during the hospital encounter of 03/19/17  . ED EKG  . ED EKG    IMPRESSION AND PLAN:  Principal Problem:   Septic Shock ([place]) -IV antibiotics initiated.  Lactic acid was within normal limits, blood pressure was initially okay, but then fell.  IV fluid resuscitation given without improvement in blood pressure, levophed started, cultures sent from the ED. Active Problems:   Diabetic foot infection ([place]) -this is the most likely cause of sepsis as her other workup does not show any other infectious etiology at this time.  IV antibiotics started, podiatry consult ordered.  X-ray imaging does not indicate osteomyelitis   Chronic  diastolic heart failure (HCC) -continue home meds, except for any that might lower her blood pressure at this time   HTN (hypertension) -holding home antihypertensives as the patient has become hypotensive   DM (diabetes mellitus), type 2, uncontrolled (HCC) -sliding scale insulin with corresponding glucose checks   CKD (chronic kidney disease), stage III ([place]) -patient's creatinine is very close to baseline, we will avoid nephrotoxins and monitor  All the records are reviewed and case discussed with ED provider. Management plans discussed with the patient and/or family.  DVT PROPHYLAXIS: SubQ heparin  GI PROPHYLAXIS: None  ADMISSION STATUS: Inpatient  ODE STATUS: Full Code Status History    Date Active Date Inactive Code Status Order ID Comments User Context   07/07/2015 03:53 07/09/2015 15:20 Full Code [id number]  Saundra Shelling, MD Inpatient   02/07/2015 16:05 02/12/2015 19:21 Full Code [id number]  Dustin Flock, MD Inpatient   11/05/2014 15:51 11/09/2014 15:36 Full Code [id number]  Gladstone Lighter, MD Inpatient   10/10/2014 11:12 10/17/2014 17:50 Full Code [id number]  Epifanio Lesches, MD Inpatient      TOTAL CRITICAL CARE TIME TAKING CARE OF THIS PATIENT: 50 minutes.   Demitrius Crass Gentry 03/19/2017, 9:31 PM  [facility]  Office  [phone]  CC: Primary care physician; Rusty Aus, MD  Note:  This document was prepared using Dragon voice recognition software and may include unintentional dictation errors.

## 2017-03-20 ENCOUNTER — Ambulatory Visit (INDEPENDENT_AMBULATORY_CARE_PROVIDER_SITE_OTHER): Payer: PPO | Admitting: Vascular Surgery

## 2017-03-20 ENCOUNTER — Inpatient Hospital Stay: Payer: PPO

## 2017-03-20 ENCOUNTER — Encounter (INDEPENDENT_AMBULATORY_CARE_PROVIDER_SITE_OTHER): Payer: PPO

## 2017-03-20 DIAGNOSIS — E11628 Type 2 diabetes mellitus with other skin complications: Secondary | ICD-10-CM

## 2017-03-20 DIAGNOSIS — A419 Sepsis, unspecified organism: Principal | ICD-10-CM

## 2017-03-20 DIAGNOSIS — R6521 Severe sepsis with septic shock: Secondary | ICD-10-CM

## 2017-03-20 DIAGNOSIS — L089 Local infection of the skin and subcutaneous tissue, unspecified: Secondary | ICD-10-CM

## 2017-03-20 LAB — CBC
HCT: 27.4 % — ABNORMAL LOW (ref 35.0–47.0)
HEMOGLOBIN: 8.5 g/dL — AB (ref 12.0–16.0)
MCH: 24.2 pg — AB (ref 26.0–34.0)
MCHC: 31.1 g/dL — AB (ref 32.0–36.0)
MCV: 77.7 fL — AB (ref 80.0–100.0)
Platelets: 274 10*3/uL (ref 150–440)
RBC: 3.53 MIL/uL — ABNORMAL LOW (ref 3.80–5.20)
RDW: 22.5 % — ABNORMAL HIGH (ref 11.5–14.5)
WBC: 20 10*3/uL — ABNORMAL HIGH (ref 3.6–11.0)

## 2017-03-20 LAB — TROPONIN I
TROPONIN I: 0.12 ng/mL — AB (ref <0.03)
Troponin I: 0.11 ng/mL (ref <0.03)
Troponin I: 0.11 ng/mL (ref <0.03)

## 2017-03-20 LAB — BASIC METABOLIC PANEL
Anion gap: 11 (ref 5–15)
BUN: 38 mg/dL — AB (ref 6–20)
CHLORIDE: 100 mmol/L — AB (ref 101–111)
CO2: 20 mmol/L — ABNORMAL LOW (ref 22–32)
CREATININE: 1.27 mg/dL — AB (ref 0.44–1.00)
Calcium: 7.4 mg/dL — ABNORMAL LOW (ref 8.9–10.3)
GFR calc Af Amer: 45 mL/min — ABNORMAL LOW (ref >60)
GFR calc non Af Amer: 38 mL/min — ABNORMAL LOW (ref >60)
Glucose, Bld: 283 mg/dL — ABNORMAL HIGH (ref 65–99)
Potassium: 4 mmol/L (ref 3.5–5.1)
SODIUM: 131 mmol/L — AB (ref 135–145)

## 2017-03-20 LAB — GLUCOSE, CAPILLARY
GLUCOSE-CAPILLARY: 221 mg/dL — AB (ref 65–99)
GLUCOSE-CAPILLARY: 275 mg/dL — AB (ref 65–99)
Glucose-Capillary: 251 mg/dL — ABNORMAL HIGH (ref 65–99)
Glucose-Capillary: 319 mg/dL — ABNORMAL HIGH (ref 65–99)
Glucose-Capillary: 307 mg/dL — ABNORMAL HIGH (ref 65–99)

## 2017-03-20 LAB — LACTIC ACID, PLASMA: Lactic Acid, Venous: 0.8 mmol/L (ref 0.5–1.9)

## 2017-03-20 LAB — MRSA PCR SCREENING: MRSA by PCR: NEGATIVE

## 2017-03-20 MED ORDER — ACETAMINOPHEN 325 MG PO TABS
650.0000 mg | ORAL_TABLET | Freq: Four times a day (QID) | ORAL | Status: DC | PRN
  Administered 2017-03-20 – 2017-03-22 (×3): 650 mg via ORAL
  Filled 2017-03-20 (×3): qty 2

## 2017-03-20 MED ORDER — VANCOMYCIN HCL IN DEXTROSE 1-5 GM/200ML-% IV SOLN
1000.0000 mg | INTRAVENOUS | Status: DC
  Administered 2017-03-20 – 2017-03-22 (×2): 1000 mg via INTRAVENOUS
  Filled 2017-03-20 (×2): qty 200

## 2017-03-20 MED ORDER — IPRATROPIUM-ALBUTEROL 0.5-2.5 (3) MG/3ML IN SOLN
3.0000 mL | RESPIRATORY_TRACT | Status: DC
  Administered 2017-03-20 – 2017-03-21 (×3): 3 mL via RESPIRATORY_TRACT
  Filled 2017-03-20 (×4): qty 3

## 2017-03-20 MED ORDER — HEPARIN SODIUM (PORCINE) 5000 UNIT/ML IJ SOLN
5000.0000 [IU] | Freq: Three times a day (TID) | INTRAMUSCULAR | Status: DC
  Administered 2017-03-20 – 2017-03-23 (×10): 5000 [IU] via SUBCUTANEOUS
  Filled 2017-03-20 (×11): qty 1

## 2017-03-20 MED ORDER — LEVOTHYROXINE SODIUM 112 MCG PO TABS
112.0000 ug | ORAL_TABLET | Freq: Every day | ORAL | Status: DC
  Administered 2017-03-22 – 2017-03-23 (×2): 112 ug via ORAL
  Filled 2017-03-20 (×5): qty 1

## 2017-03-20 MED ORDER — INSULIN ASPART 100 UNIT/ML ~~LOC~~ SOLN
0.0000 [IU] | Freq: Three times a day (TID) | SUBCUTANEOUS | Status: DC
  Administered 2017-03-20: 3 [IU] via SUBCUTANEOUS
  Filled 2017-03-20: qty 1

## 2017-03-20 MED ORDER — OCUVITE-LUTEIN PO CAPS
1.0000 | ORAL_CAPSULE | Freq: Every day | ORAL | Status: DC
  Administered 2017-03-20 – 2017-03-23 (×3): 1 via ORAL
  Filled 2017-03-20 (×4): qty 1

## 2017-03-20 MED ORDER — ACETAMINOPHEN 650 MG RE SUPP: 650.0000 mg | Freq: Four times a day (QID) | RECTAL | Status: DC | PRN

## 2017-03-20 MED ORDER — METHYLPREDNISOLONE SODIUM SUCC 40 MG IJ SOLR
20.0000 mg | Freq: Two times a day (BID) | INTRAMUSCULAR | Status: DC
  Administered 2017-03-20 – 2017-03-22 (×4): 20 mg via INTRAVENOUS
  Filled 2017-03-20 (×3): qty 1

## 2017-03-20 MED ORDER — INSULIN ASPART 100 UNIT/ML ~~LOC~~ SOLN
0.0000 [IU] | Freq: Every day | SUBCUTANEOUS | Status: DC
  Administered 2017-03-20: 4 [IU] via SUBCUTANEOUS
  Administered 2017-03-20 – 2017-03-22 (×3): 3 [IU] via SUBCUTANEOUS
  Filled 2017-03-20 (×4): qty 1

## 2017-03-20 MED ORDER — POLYETHYLENE GLYCOL 3350 17 G PO PACK
17.0000 g | PACK | Freq: Every day | ORAL | Status: DC | PRN
  Administered 2017-03-20: 17 g via ORAL
  Filled 2017-03-20: qty 1

## 2017-03-20 MED ORDER — OXYCODONE HCL 5 MG PO TABS
5.0000 mg | ORAL_TABLET | ORAL | Status: DC | PRN
  Administered 2017-03-20 – 2017-03-23 (×4): 5 mg via ORAL
  Filled 2017-03-20 (×4): qty 1

## 2017-03-20 MED ORDER — ALPRAZOLAM 0.5 MG PO TABS
0.5000 mg | ORAL_TABLET | Freq: Once | ORAL | Status: AC
  Administered 2017-03-20: 0.5 mg via ORAL
  Filled 2017-03-20: qty 1

## 2017-03-20 MED ORDER — PIPERACILLIN-TAZOBACTAM 3.375 G IVPB
3.3750 g | Freq: Three times a day (TID) | INTRAVENOUS | Status: DC
  Administered 2017-03-20 – 2017-03-23 (×10): 3.375 g via INTRAVENOUS
  Filled 2017-03-20 (×10): qty 50

## 2017-03-20 MED ORDER — PREMIER PROTEIN SHAKE
11.0000 [oz_av] | Freq: Two times a day (BID) | ORAL | Status: DC
  Administered 2017-03-21 – 2017-03-23 (×4): 11 [oz_av] via ORAL

## 2017-03-20 MED ORDER — INSULIN GLARGINE 100 UNIT/ML ~~LOC~~ SOLN
6.0000 [IU] | Freq: Every day | SUBCUTANEOUS | Status: DC
  Administered 2017-03-20: 6 [IU] via SUBCUTANEOUS
  Filled 2017-03-20 (×2): qty 0.06

## 2017-03-20 MED ORDER — ONDANSETRON HCL 4 MG PO TABS: 4.0000 mg | ORAL_TABLET | Freq: Four times a day (QID) | ORAL | Status: DC | PRN

## 2017-03-20 MED ORDER — INSULIN ASPART 100 UNIT/ML ~~LOC~~ SOLN
0.0000 [IU] | Freq: Three times a day (TID) | SUBCUTANEOUS | Status: DC
  Administered 2017-03-20: 11 [IU] via SUBCUTANEOUS
  Administered 2017-03-20: 8 [IU] via SUBCUTANEOUS
  Administered 2017-03-21: 5 [IU] via SUBCUTANEOUS
  Administered 2017-03-21 (×2): 11 [IU] via SUBCUTANEOUS
  Administered 2017-03-22: 3 [IU] via SUBCUTANEOUS
  Administered 2017-03-22: 11 [IU] via SUBCUTANEOUS
  Administered 2017-03-22: 5 [IU] via SUBCUTANEOUS
  Administered 2017-03-23: 11 [IU] via SUBCUTANEOUS
  Administered 2017-03-23: 5 [IU] via SUBCUTANEOUS
  Administered 2017-03-23: 3 [IU] via SUBCUTANEOUS
  Filled 2017-03-20 (×11): qty 1

## 2017-03-20 MED ORDER — SODIUM CHLORIDE 0.9 % IV SOLN
INTRAVENOUS | Status: AC
  Administered 2017-03-20: 03:00:00 via INTRAVENOUS

## 2017-03-20 MED ORDER — ONDANSETRON HCL 4 MG/2ML IJ SOLN
4.0000 mg | Freq: Four times a day (QID) | INTRAMUSCULAR | Status: DC | PRN
  Administered 2017-03-20 – 2017-03-21 (×2): 4 mg via INTRAVENOUS
  Filled 2017-03-20: qty 2

## 2017-03-20 MED ORDER — MORPHINE SULFATE (PF) 2 MG/ML IV SOLN
1.0000 mg | Freq: Once | INTRAVENOUS | Status: AC
  Administered 2017-03-20: 1 mg via INTRAVENOUS

## 2017-03-20 MED ORDER — METHYLPREDNISOLONE SODIUM SUCC 125 MG IJ SOLR
INTRAMUSCULAR | Status: AC
  Filled 2017-03-20: qty 2

## 2017-03-20 MED ORDER — MORPHINE SULFATE (PF) 2 MG/ML IV SOLN
INTRAVENOUS | Status: AC
  Filled 2017-03-20: qty 1

## 2017-03-20 MED ORDER — SIMVASTATIN 20 MG PO TABS
40.0000 mg | ORAL_TABLET | Freq: Every day | ORAL | Status: DC
  Administered 2017-03-20 – 2017-03-23 (×4): 40 mg via ORAL
  Filled 2017-03-20 (×4): qty 2

## 2017-03-20 MED ORDER — IPRATROPIUM-ALBUTEROL 0.5-2.5 (3) MG/3ML IN SOLN
3.0000 mL | Freq: Once | RESPIRATORY_TRACT | Status: AC
  Administered 2017-03-20: 3 mL via RESPIRATORY_TRACT

## 2017-03-20 MED ORDER — NOREPINEPHRINE 4 MG/250ML-% IV SOLN
0.0000 ug/min | INTRAVENOUS | Status: DC
  Administered 2017-03-20: 2 ug/min via INTRAVENOUS
  Filled 2017-03-20: qty 250

## 2017-03-20 MED ORDER — AMIODARONE HCL 200 MG PO TABS
200.0000 mg | ORAL_TABLET | Freq: Every day | ORAL | Status: DC
  Administered 2017-03-20 – 2017-03-23 (×4): 200 mg via ORAL
  Filled 2017-03-20 (×4): qty 1

## 2017-03-20 MED ORDER — ASPIRIN 81 MG PO CHEW
81.0000 mg | CHEWABLE_TABLET | Freq: Every day | ORAL | Status: DC
  Administered 2017-03-20 – 2017-03-23 (×4): 81 mg via ORAL
  Filled 2017-03-20 (×4): qty 1

## 2017-03-20 NOTE — ED Notes (Signed)
Per pt, call her son [name] - [phone] - with updates to plan of care and bed assignment if he is not at bedside.

## 2017-03-20 NOTE — ED Notes (Signed)
Pt sleeping at this time, audible equal and unlabored resp noted.

## 2017-03-20 NOTE — ED Notes (Signed)
Pt states she does not have the urge to urinate. Pt has had 2800 mL's on NS since arrival. RN bladder scanned pt with highest reading of 399.

## 2017-03-20 NOTE — ED Notes (Addendum)
Attempted to call report

## 2017-03-20 NOTE — H&P (View-Only) (Signed)
Reason for Consult: Ulceration with cellulitis and gas in the tissues right foot. Referring Physician: Levell July P Plunk is an 84 y.o. female.  HPI: This is an 84 year old diabetic female with a long history of ulcerations and foot issues related to her diabetes.  Recently started to notice some increased redness and swelling and a sore on her right foot with some drainage.  Presented to the emergency department and was admitted due to sepsis.  Past Medical History:  Diagnosis Date  . Anemia   . Atrial fibrillation ([place])   . Cardiac arrest ([place])   . Cataract   . CHF (congestive heart failure) ([place])   . Diabetes mellitus without complication ([place])   . Edema    feet/ankles occas  . Hip fracture ([place])   . Hyperlipidemia   . Hypertension   . Hypothyroid   . Neuropathy   . Osteomyelitis ([place])    left first metatarsal  . Stroke [facility])     Past Surgical History:  Procedure Laterality Date  . ACHILLES TENDON SURGERY Left 02/08/2015   Procedure: ACHILLES LENGTHENING/KIDNER;  Surgeon: Albertine Patricia, DPM;  Location: [facility] ORS;  Service: Podiatry;  Laterality: Left;  . APPENDECTOMY    . CARDIAC CATHETERIZATION  08/25/13  . CATARACT EXTRACTION W/PHACO Left 07/20/2014   Procedure: CATARACT EXTRACTION PHACO AND INTRAOCULAR LENS PLACEMENT (IOC);  Surgeon: Leandrew Koyanagi, MD;  Location: [facility] ORS;  Service: Ophthalmology;  Laterality: Left;  [place]  1:18                 AP     23.6             CDE   9.69      lot #[phone]  . CHOLECYSTECTOMY    . CORONARY ANGIOPLASTY    . HALLUX VALGUS AKIN Left 02/08/2015   Procedure: HALLUX VALGUS AKIN/ KELLER;  Surgeon: Albertine Patricia, DPM;  Location: [facility] ORS;  Service: Podiatry;  Laterality: Left;  IVA with Local needs 1 hour for this case   . HEMIARTHROPLASTY HIP Right   . HEMIARTHROPLASTY HIP Left   . INCISION AND DRAINAGE Left 10/11/2014   Procedure: Removal of infected tibial sessmoid;  Surgeon: Albertine Patricia, DPM;  Location: [facility] ORS;   Service: Podiatry;  Laterality: Left;    Family History  Problem Relation Age of Onset  . Heart attack Mother     Social History:  reports that she quit smoking about 27 years ago. Her smoking use included cigarettes. She has a 3.50 pack-year smoking history. she has never used smokeless tobacco. She reports that she does not drink alcohol or use drugs.  Allergies:  Allergies  Allergen Reactions  . Carbamazepine Other (See Comments)    Reaction: unknown  . Codeine Other (See Comments)    Other reaction(s): Other (See Comments) Patient states she did not feel herself when she took it. unknown  . Cymbalta [Duloxetine Hcl] Other (See Comments)    Confusion, disorientation  . Duloxetine     Other reaction(s): Other (See Comments) Confusion, disorientation  . Lyrica [Pregabalin] Other (See Comments)    Patient and son states this makes the patient confused.    Medications:  Scheduled: . amiodarone  200 mg Oral Daily  . aspirin  81 mg Oral Daily  . heparin  5,000 Units Subcutaneous Q8H  . insulin aspart  0-15 Units Subcutaneous TID WC  . insulin aspart  0-5 Units Subcutaneous QHS  . insulin glargine  6 Units Subcutaneous Daily  .  levothyroxine  112 mcg Oral QAC breakfast  . ondansetron (ZOFRAN) IV  4 mg Intravenous Once  . simvastatin  40 mg Oral q1800    Results for orders placed or performed during the hospital encounter of 03/19/17 (from the past 48 hour(s))  Comprehensive metabolic panel     Status: Abnormal   Collection Time: 03/19/17  8:18 PM  Result Value Ref Range   Sodium 128 (L) 135 - 145 mmol/L   Potassium 2.7 (LL) 3.5 - 5.1 mmol/L    Comment: CRITICAL RESULT CALLED TO, READ BACK BY AND VERIFIED WITH LORIE LEMONS ON 03/19/17 AT 2116 JAG    Chloride 91 (L) 101 - 111 mmol/L   CO2 24 22 - 32 mmol/L   Glucose, Bld 343 (H) 65 - 99 mg/dL   BUN 44 (H) 6 - 20 mg/dL   Creatinine, Ser 1.56 (H) 0.44 - 1.00 mg/dL   Calcium 8.1 (L) 8.9 - 10.3 mg/dL   Total Protein 6.6  6.5 - 8.1 g/dL   Albumin 2.7 (L) 3.5 - 5.0 g/dL   AST 33 15 - 41 U/L   ALT 14 14 - 54 U/L   Alkaline Phosphatase 76 38 - 126 U/L   Total Bilirubin 0.7 0.3 - 1.2 mg/dL   GFR calc non Af Amer 30 (L) >60 mL/min   GFR calc Af Amer 35 (L) >60 mL/min    Comment: (NOTE) The eGFR has been calculated using the CKD EPI equation. This calculation has not been validated in all clinical situations. eGFR's persistently <60 mL/min signify possible Chronic Kidney Disease.    Anion gap 13 5 - 15    Comment: Performed at [facility], [address] [place], [place] [address]  CBC with Differential     Status: Abnormal   Collection Time: 03/19/17  8:18 PM  Result Value Ref Range   WBC 17.4 (H) 3.6 - 11.0 K/uL   RBC 4.02 3.80 - 5.20 MIL/uL   Hemoglobin 9.7 (L) 12.0 - 16.0 g/dL   HCT 30.5 (L) 35.0 - 47.0 %   MCV 75.8 (L) 80.0 - 100.0 fL   MCH 24.2 (L) 26.0 - 34.0 pg   MCHC 32.0 32.0 - 36.0 g/dL   RDW 22.7 (H) 11.5 - 14.5 %   Platelets 277 150 - 440 K/uL   Neutrophils Relative % 90 %   Neutro Abs 15.7 (H) 1.4 - 6.5 K/uL   Lymphocytes Relative 2 %   Lymphs Abs 0.3 (L) 1.0 - 3.6 K/uL   Monocytes Relative 8 %   Monocytes Absolute 1.3 (H) 0.2 - 0.9 K/uL   Eosinophils Relative 0 %   Eosinophils Absolute 0.0 0 - 0.7 K/uL   Basophils Relative 0 %   Basophils Absolute 0.0 0 - 0.1 K/uL    Comment: Performed at [facility], [address] [place], [place] [address]  Troponin I     Status: Abnormal   Collection Time: 03/19/17  8:18 PM  Result Value Ref Range   Troponin I 0.12 (HH) <0.03 ng/mL    Comment: CRITICAL RESULT CALLED TO, READ BACK BY AND VERIFIED WITH LORIE LEMONS ON 03/19/17 AT 2116 JAG Performed at [facility], [address] [place], [place] [address]   Influenza panel by PCR (type A & B)     Status: None   Collection Time: 03/19/17  8:18 PM  Result Value Ref Range   Influenza A By PCR NEGATIVE NEGATIVE   Influenza B By PCR NEGATIVE NEGATIVE  Comment: (NOTE) The Xpert Xpress Flu assay is intended as an aid in the diagnosis of  influenza and should not be used as a sole basis for treatment.  This  assay is FDA approved for nasopharyngeal swab specimens only. Nasal  washings and aspirates are unacceptable for Xpert Xpress Flu testing. Performed at [facility], [address] [place], [place] [address]   Lipase, blood     Status: None   Collection Time: 03/19/17  8:18 PM  Result Value Ref Range   Lipase 25 11 - 51 U/L    Comment: Performed at [facility], [address] [place], [place] [address]  Procalcitonin     Status: None   Collection Time: 03/19/17  8:18 PM  Result Value Ref Range   Procalcitonin 0.95 ng/mL    Comment:        Interpretation: PCT > 0.5 ng/mL and <= 2 ng/mL: Systemic infection (sepsis) is possible, but other conditions are known to elevate PCT as well. (NOTE)       Sepsis PCT Algorithm           Lower Respiratory Tract                                      Infection PCT Algorithm    ----------------------------     ----------------------------         PCT < 0.25 ng/mL                PCT < 0.10 ng/mL         Strongly encourage             Strongly discourage   discontinuation of antibiotics    initiation of antibiotics    ----------------------------     -----------------------------       PCT 0.25 - 0.50 ng/mL            PCT 0.10 - 0.25 ng/mL               OR       >80% decrease in PCT            Discourage initiation of                                            antibiotics      Encourage discontinuation           of antibiotics    ----------------------------     -----------------------------         PCT >= 0.50 ng/mL              PCT 0.26 - 0.50 ng/mL                AND       <80% decrease in PCT             Encourage initiation of                                             antibiotics       Encourage continuation           of antibiotics     ----------------------------     -----------------------------  PCT >= 0.50 ng/mL                  PCT > 0.50 ng/mL               AND         increase in PCT                  Strongly encourage                                      initiation of antibiotics    Strongly encourage escalation           of antibiotics                                     -----------------------------                                           PCT <= 0.25 ng/mL                                                 OR                                        > 80% decrease in PCT                                     Discontinue / Do not initiate                                             antibiotics Performed at [facility], [address] [place], [place] [address]   Protime-INR     Status: None   Collection Time: 03/19/17  8:18 PM  Result Value Ref Range   Prothrombin Time 14.4 11.4 - 15.2 seconds   INR 1.13     Comment: Performed at [facility], [address], [place], [place] [address]  Blood Culture (routine x 2)     Status: None (Preliminary result)   Collection Time: 03/19/17  8:18 PM  Result Value Ref Range   Specimen Description BLOOD RIGHT ARM    Special Requests      BOTTLES DRAWN AEROBIC AND ANAEROBIC Blood Culture adequate volume   Culture      NO GROWTH < 12 HOURS Performed at [facility], [address] [place], [place] [address]    Report Status PENDING   Urinalysis, Complete w Microscopic     Status: Abnormal   Collection Time: 03/19/17  8:20 PM  Result Value Ref Range   Color, Urine YELLOW (A) YELLOW   APPearance HAZY (A) CLEAR   Specific Gravity, Urine 1.012 1.005 - 1.030   pH 5.0 5.0 - 8.0   Glucose, UA 150 (A) NEGATIVE mg/dL  Hgb urine dipstick SMALL (A) NEGATIVE   Bilirubin Urine NEGATIVE NEGATIVE   Ketones, ur NEGATIVE NEGATIVE mg/dL   Protein, ur >=300 (A) NEGATIVE mg/dL   Nitrite NEGATIVE NEGATIVE   Leukocytes, UA NEGATIVE NEGATIVE    RBC / HPF 6-30 0 - 5 RBC/hpf   WBC, UA 6-30 0 - 5 WBC/hpf   Bacteria, UA RARE (A) NONE SEEN   Squamous Epithelial / LPF 0-5 (A) NONE SEEN   Mucus PRESENT    Budding Yeast PRESENT     Comment: Performed at [facility], [address] [place], [place] [address]  Lactic acid, plasma     Status: None   Collection Time: 03/19/17  8:20 PM  Result Value Ref Range   Lactic Acid, Venous 1.5 0.5 - 1.9 mmol/L    Comment: Performed at [facility], [address] [place], [place] [address]  Blood Culture (routine x 2)     Status: None (Preliminary result)   Collection Time: 03/19/17  8:30 PM  Result Value Ref Range   Specimen Description BLOOD RIGHT ARM    Special Requests      BOTTLES DRAWN AEROBIC AND ANAEROBIC Blood Culture adequate volume   Culture      NO GROWTH < 12 HOURS Performed at [facility], [address] [place], [place] [address]    Report Status PENDING   Glucose, capillary     Status: Abnormal   Collection Time: 03/20/17  1:57 AM  Result Value Ref Range   Glucose-Capillary 251 (H) 65 - 99 mg/dL  Lactic acid, plasma     Status: None   Collection Time: 03/20/17  2:34 AM  Result Value Ref Range   Lactic Acid, Venous 0.8 0.5 - 1.9 mmol/L    Comment: Performed at [facility], [address] [place], [place] [address]  CBC     Status: Abnormal   Collection Time: 03/20/17  2:34 AM  Result Value Ref Range   WBC 20.0 (H) 3.6 - 11.0 K/uL   RBC 3.53 (L) 3.80 - 5.20 MIL/uL   Hemoglobin 8.5 (L) 12.0 - 16.0 g/dL   HCT 27.4 (L) 35.0 - 47.0 %   MCV 77.7 (L) 80.0 - 100.0 fL   MCH 24.2 (L) 26.0 - 34.0 pg   MCHC 31.1 (L) 32.0 - 36.0 g/dL   RDW 22.5 (H) 11.5 - 14.5 %   Platelets 274 150 - 440 K/uL    Comment: Performed at [facility], [address] [place], [place] [address]  Basic metabolic panel     Status: Abnormal   Collection Time: 03/20/17  2:34 AM  Result Value Ref Range   Sodium 131 (L) 135 - 145 mmol/L   Potassium  4.0 3.5 - 5.1 mmol/L   Chloride 100 (L) 101 - 111 mmol/L   CO2 20 (L) 22 - 32 mmol/L   Glucose, Bld 283 (H) 65 - 99 mg/dL   BUN 38 (H) 6 - 20 mg/dL   Creatinine, Ser 1.27 (H) 0.44 - 1.00 mg/dL   Calcium 7.4 (L) 8.9 - 10.3 mg/dL   GFR calc non Af Amer 38 (L) >60 mL/min   GFR calc Af Amer 45 (L) >60 mL/min    Comment: (NOTE) The eGFR has been calculated using the CKD EPI equation. This calculation has not been validated in all clinical situations. eGFR's persistently <60 mL/min signify possible Chronic Kidney Disease.    Anion gap 11 5 - 15    Comment: Performed  at [facility], [address] [place], [place] [address]  Troponin I     Status: Abnormal   Collection Time: 03/20/17  2:34 AM  Result Value Ref Range   Troponin I 0.12 (HH) <0.03 ng/mL    Comment: CRITICAL VALUE NOTED. VALUE IS CONSISTENT WITH PREVIOUSLY REPORTED/CALLED VALUE...[facility] Performed at [facility], [address] [place], [place] [address]   Glucose, capillary     Status: Abnormal   Collection Time: 03/20/17  8:17 AM  Result Value Ref Range   Glucose-Capillary 221 (H) 65 - 99 mg/dL  Troponin I     Status: Abnormal   Collection Time: 03/20/17  9:26 AM  Result Value Ref Range   Troponin I 0.11 (HH) <0.03 ng/mL    Comment: CRITICAL VALUE NOTED. VALUE IS CONSISTENT WITH PREVIOUSLY REPORTED/CALLED VALUE / [place] Performed at [facility], [address] [place], [place] [address]   MRSA PCR Screening     Status: None   Collection Time: 03/20/17  9:43 AM  Result Value Ref Range   MRSA by PCR NEGATIVE NEGATIVE    Comment:        The GeneXpert MRSA Assay (FDA approved for NASAL specimens only), is one component of a comprehensive MRSA colonization surveillance program. It is not intended to diagnose MRSA infection nor to guide or monitor treatment for MRSA infections. Performed at [facility], [address] [place], [place] [address]     Dg Chest 1  View  Result Date: 03/19/2017 CLINICAL DATA:  Short of breath EXAM: CHEST 1 VIEW COMPARISON:  10/15/2014 FINDINGS: Mild biapical scarring. No focal pulmonary opacity or pleural effusion. Stable cardiomediastinal silhouette with aortic atherosclerosis. No pneumothorax. Old right second rib fracture. IMPRESSION: No active disease. Electronically Signed   By: Donavan Foil M.D.   On: 03/19/2017 19:59   Dg Ankle Complete Right  Result Date: 03/19/2017 CLINICAL DATA:  Erythema. Evaluate for possible osteomyelitis. Personal history of left foot osteomyelitis in 2016. EXAM: RIGHT ANKLE - COMPLETE 3+ VIEW COMPARISON:  None. FINDINGS: Mild diffuse soft tissue swelling. Osseous demineralization. No evidence of acute fracture or dislocation. Ankle mortise intact with mild joint space narrowing. No evidence of osteomyelitis. IMPRESSION: 1. No acute osseous abnormality.  No evidence of osteomyelitis. 2. Osseous demineralization. 3. Mild osteoarthritis. Electronically Signed   By: Evangeline Dakin M.D.   On: 03/19/2017 21:21   Ct Head Wo Contrast  Result Date: 03/19/2017 CLINICAL DATA:  84 year old with progressively worsening confusion over the past 2 days. Patient fell yesterday. Initial encounter. EXAM: CT HEAD WITHOUT CONTRAST TECHNIQUE: Contiguous axial images were obtained from the base of the skull through the vertex without intravenous contrast. COMPARISON:  12/02/2014, 11/05/2014. FINDINGS: Brain: Ventricular system normal in size and appearance for age. Mild age related cortical atrophy, unchanged. Moderate changes of small vessel disease of the white matter diffusely, unchanged. No mass lesion. No midline shift. No acute hemorrhage or hematoma. No extra-axial fluid collections. No evidence of acute infarction. Vascular: Mild bilateral carotid siphon and vertebral artery atherosclerosis. No hyperdense vessel. Skull: Marked hyperostosis frontalis interna bilaterally. No skull fracture or other focal osseous  abnormality involving the skull. Sinuses/Orbits: Visualized paranasal sinuses, bilateral mastoid air cells and bilateral middle ear cavities well-aerated. Visualized orbits and globes intact. Hyperdense lens involving the right globe, query cataract. Other: None. IMPRESSION: 1. No acute intracranial abnormality. 2. Stable mild age related cortical atrophy and stable moderate chronic microvascular ischemic changes of the white matter. Electronically Signed  By: Evangeline Dakin M.D.   On: 03/19/2017 20:11   Dg Foot Complete Left  Result Date: 03/19/2017 CLINICAL DATA:  Erythema. Evaluate for possible osteomyelitis. Personal history of left foot osteomyelitis in 2016. EXAM: LEFT FOOT - COMPLETE 3+ VIEW COMPARISON:  Left foot MRI 10/10/2014 left foot x-rays 02/23/2012. FINDINGS: No evidence of acute fracture or dislocation. Osseous demineralization. Hammertoe deformity. Likely remote healed fracture involving the proximal phalanx of the second toe. No evidence of osteomyelitis. Narrowing of the IP joint spaces of the toes and narrowing of joint spaces in the midfoot, unchanged. Small plantar calcaneal spur. Small enthesopathic spur at the insertion of the Achilles tendon on the posterior calcaneus. IMPRESSION: 1. No acute osseous abnormality.  No evidence of osteomyelitis. 2. Osseous demineralization. 3. Mild osteoarthritis. Electronically Signed   By: Evangeline Dakin M.D.   On: 03/19/2017 21:20   Dg Foot Complete Right  Result Date: 03/19/2017 CLINICAL DATA:  Erythema. Evaluate for possible osteomyelitis. Personal history of left foot osteomyelitis in 2016. EXAM: RIGHT FOOT COMPLETE - 3+ VIEW COMPARISON:  None. FINDINGS: Soft tissue ulceration with gas in the tissues of the plantar surface underlying the head of the first metatarsal. No evidence of osteomyelitis. No evidence of acute fracture or dislocation. Osseous demineralization. Remote fracture involving the head of the third metatarsal with incomplete  union. Hammertoe deformity. Joint space narrowing involving the IP joints of the toes and joints of the midfoot. IMPRESSION: 1. Soft tissue ulceration with gas in the tissues of the plantar surface of the foot underlying the head of the first metatarsal. No evidence of osteomyelitis. 2. Remote fracture involving the head of the third metatarsal with incomplete union. 3. Mild osteoarthritis. 4. Osseous demineralization. Electronically Signed   By: Evangeline Dakin M.D.   On: 03/19/2017 21:24    Review of Systems  Constitutional: Positive for fever. Negative for chills and malaise/fatigue.  HENT: Negative.   Eyes: Negative.   Respiratory: Negative.   Cardiovascular: Negative.   Gastrointestinal: Negative for nausea and vomiting.  Genitourinary: Negative.   Musculoskeletal:       Some pain in the right foot.  Skin:       Patient relates a recent draining ulceration on her right foot for the last couple of weeks.  Significant increase in redness and swelling in the right leg recently.  Neurological:       Patient relates significant numbness and neuropathy from her diabetes.  Endo/Heme/Allergies: Negative.   Psychiatric/Behavioral: Negative.    Blood pressure 99/69, pulse 83, temperature 97.9 F (36.6 C), temperature source Oral, resp. rate (!) 24, height _0  (1.651 m), weight 64.8 kg (142 lb 13.7 oz), SpO2 92 %. Physical Exam  Cardiovascular:  DP pulse is 2/4 bilateral.  PT pulses 1/4 bilateral.  Musculoskeletal:  Adequate range of motion of the pedal joints.  There is some pain on palpation around the ulcerative site on the right forefoot.  Neurological:  Loss of protective threshold with a monofilament wire bilaterally.  Skin:  The skin is warm dry and atrophic bilateral.  Significant edema and erythema in the entire right lower extremity.  Full-thickness ulceration on the plantar aspect of the right foot beneath the first metatarsal head with some drainage.  Some air in the drainage  is noted upon palpation of the plantar tissues.    Assessment/Plan: Assessment: 1.  Cellulitis with abscess right foot with gas in the tissues. 2.  Diabetes with associated neuropathy.  Plan: Discussed with the patient the need  for debridement of the infection in her right foot.  We will obtain an MRI for evaluation of the extent of infection for any possible early bone or joint involvement.  Discussed with the patient that pending the results of her MRI this may require a more extensive debridement possibly including amputation of the great toe.  Obtain consent form for debridement of soft tissue and/or bone right foot with possible first ray resection.  N.p.o. after midnight.  At this point we will plan for surgery tomorrow morning if cleared  [name] 03/20/2017, 11:26 AM

## 2017-03-20 NOTE — ED Notes (Signed)
Notified pt's son [name] that pt will be transported to ICU 6, the son verbalized understanding.

## 2017-03-20 NOTE — Consult Note (Signed)
[facility] Nurse wound consult note Reason for Consult:Full thickness wounds to plantar aspects of feet at 1st metatarsal heads, R>L.  Infectious process on right. Wound type: Pressure Injury POA: NA Measurement: Right foot:  3cm x 4cm area of involvement with 1.5cm round x 0.5cm wound in center. White discoloration indicative of infection, foot red and warm. Left foot with 1.5cm round callous obscuring wound beneath. Wound bed:As described above Drainage (amount, consistency, odor) Red/brown serosanguinous exudate from right foot wound Periwound: As described above Dressing procedure/placement/frequency:Heels are at risk for pressure injury, pressure redistribution heel boots are provided. Conservative wound care to both wounds (saline moistened gauze) is ordered twice daily.  Recommend surgical/orthopedic/podiatric consultation for the right plantar ulceration following radiographic studies to determine if their is bony involvement. If you agree, please consult/order.  [facility] nursing team will not follow, but will remain available to this patient, the nursing and medical teams.  Please re-consult if needed. Thanks, Maudie Flakes, MSN, RN, [place], Arther Abbott  Pager# [phone]

## 2017-03-20 NOTE — Progress Notes (Signed)
[facility] [place] NAME: Sharon [name]    MR#:  [id number]  DATE OF BIRTH:  Jun 13, 1933  SUBJECTIVE:   Patient presented with confusion and lethargic  Patient is awake and alert this morning.  REVIEW OF SYSTEMS:    Review of Systems  Constitutional: Negative for fever, chills weight loss HENT: Negative for ear pain, nosebleeds, congestion, facial swelling, rhinorrhea, neck pain, neck stiffness and ear discharge.   Respiratory: Negative for cough, shortness of breath, wheezing  Cardiovascular: Negative for chest pain, palpitations and leg swelling.  Gastrointestinal: Negative for heartburn, abdominal pain, vomiting, diarrhea or consitpation Genitourinary: Negative for dysuria, urgency, frequency, hematuria Musculoskeletal: Negative for back pain or joint pain Neurological: Negative for dizziness, seizures, syncope, focal weakness,  numbness and headaches.  Hematological: Does not bruise/bleed easily.  Psychiatric/Behavioral: Negative for hallucinations, confusion, dysphoric mood Skin: Right foot with redness   Tolerating Diet: yes      DRUG ALLERGIES:   Allergies  Allergen Reactions  . Carbamazepine Other (See Comments)    Reaction: unknown  . Codeine Other (See Comments)    Other reaction(s): Other (See Comments) Patient states she did not feel herself when she took it. unknown  . Cymbalta [Duloxetine Hcl] Other (See Comments)    Confusion, disorientation  . Duloxetine     Other reaction(s): Other (See Comments) Confusion, disorientation  . Lyrica [Pregabalin] Other (See Comments)    Patient and son states this makes the patient confused.    VITALS:  Blood pressure 99/69, pulse 83, temperature 97.9 F (36.6 C), temperature source Oral, resp. rate (!) 24, height 5\' 5"  (1.651 m), weight 64.8 kg (142 lb 13.7 oz), SpO2 92 %.  PHYSICAL EXAMINATION:  Constitutional: Appears well-developed and well-nourished. No  distress. HENT: Normocephalic. Marland Kitchen Oropharynx is clear and moist.  Eyes: Conjunctivae and EOM are normal. PERRLA, no scleral icterus.  Neck: Normal ROM. Neck supple. No JVD. No tracheal deviation. CVS: RRR, S1/S2 +, no murmurs, no gallops, no carotid bruit.  Pulmonary: Effort and breath sounds normal, no stridor, rhonchi, wheezes, rales.  Abdominal: Soft. BS +,  no distension, tenderness, rebound or guarding.  Musculoskeletal: Normal range of motion. No edema and no tenderness.  Neuro: Alert. CN 2-12 grossly intact. No focal deficits. Skin: right foot with edema and erythema  Small ulcerated wound noted at that bottom of foot  Psychiatric: Normal mood and affect.      LABORATORY PANEL:   CBC Recent Labs  Lab 03/20/17 0234  WBC 20.0*  HGB 8.5*  HCT 27.4*  PLT 274   ------------------------------------------------------------------------------------------------------------------  Chemistries  Recent Labs  Lab 03/19/17 2018 03/20/17 0234  NA 128* 131*  K 2.7* 4.0  CL 91* 100*  CO2 24 20*  GLUCOSE 343* 283*  BUN 44* 38*  CREATININE 1.56* 1.27*  CALCIUM 8.1* 7.4*  AST 33  --   ALT 14  --   ALKPHOS 76  --   BILITOT 0.7  --    ------------------------------------------------------------------------------------------------------------------  Cardiac Enzymes Recent Labs  Lab 03/19/17 2018 03/20/17 0234 03/20/17 0926  TROPONINI 0.12* 0.12* 0.11*   ------------------------------------------------------------------------------------------------------------------  RADIOLOGY:  Dg Chest 1 View  Result Date: 03/19/2017 CLINICAL DATA:  Short of breath EXAM: CHEST 1 VIEW COMPARISON:  10/15/2014 FINDINGS: Mild biapical scarring. No focal pulmonary opacity or pleural effusion. Stable cardiomediastinal silhouette with aortic atherosclerosis. No pneumothorax. Old right second rib fracture. IMPRESSION: No active disease. Electronically Signed   By: Madie Reno.D.  On:  03/19/2017 19:59   Dg Ankle Complete Right  Result Date: 03/19/2017 CLINICAL DATA:  Erythema. Evaluate for possible osteomyelitis. Personal history of left foot osteomyelitis in 2016. EXAM: RIGHT ANKLE - COMPLETE 3+ VIEW COMPARISON:  None. FINDINGS: Mild diffuse soft tissue swelling. Osseous demineralization. No evidence of acute fracture or dislocation. Ankle mortise intact with mild joint space narrowing. No evidence of osteomyelitis. IMPRESSION: 1. No acute osseous abnormality.  No evidence of osteomyelitis. 2. Osseous demineralization. 3. Mild osteoarthritis. Electronically Signed   By: Evangeline Dakin M.D.   On: 03/19/2017 21:21   Ct Head Wo Contrast  Result Date: 03/19/2017 CLINICAL DATA:  84 year old with progressively worsening confusion over the past 2 days. Patient fell yesterday. Initial encounter. EXAM: CT HEAD WITHOUT CONTRAST TECHNIQUE: Contiguous axial images were obtained from the base of the skull through the vertex without intravenous contrast. COMPARISON:  12/02/2014, 11/05/2014. FINDINGS: Brain: Ventricular system normal in size and appearance for age. Mild age related cortical atrophy, unchanged. Moderate changes of small vessel disease of the white matter diffusely, unchanged. No mass lesion. No midline shift. No acute hemorrhage or hematoma. No extra-axial fluid collections. No evidence of acute infarction. Vascular: Mild bilateral carotid siphon and vertebral artery atherosclerosis. No hyperdense vessel. Skull: Marked hyperostosis frontalis interna bilaterally. No skull fracture or other focal osseous abnormality involving the skull. Sinuses/Orbits: Visualized paranasal sinuses, bilateral mastoid air cells and bilateral middle ear cavities well-aerated. Visualized orbits and globes intact. Hyperdense lens involving the right globe, query cataract. Other: None. IMPRESSION: 1. No acute intracranial abnormality. 2. Stable mild age related cortical atrophy and stable moderate chronic  microvascular ischemic changes of the white matter. Electronically Signed   By: Evangeline Dakin M.D.   On: 03/19/2017 20:11   Dg Foot Complete Left  Result Date: 03/19/2017 CLINICAL DATA:  Erythema. Evaluate for possible osteomyelitis. Personal history of left foot osteomyelitis in 2016. EXAM: LEFT FOOT - COMPLETE 3+ VIEW COMPARISON:  Left foot MRI 10/10/2014 left foot x-rays 02/23/2012. FINDINGS: No evidence of acute fracture or dislocation. Osseous demineralization. Hammertoe deformity. Likely remote healed fracture involving the proximal phalanx of the second toe. No evidence of osteomyelitis. Narrowing of the IP joint spaces of the toes and narrowing of joint spaces in the midfoot, unchanged. Small plantar calcaneal spur. Small enthesopathic spur at the insertion of the Achilles tendon on the posterior calcaneus. IMPRESSION: 1. No acute osseous abnormality.  No evidence of osteomyelitis. 2. Osseous demineralization. 3. Mild osteoarthritis. Electronically Signed   By: Evangeline Dakin M.D.   On: 03/19/2017 21:20   Dg Foot Complete Right  Result Date: 03/19/2017 CLINICAL DATA:  Erythema. Evaluate for possible osteomyelitis. Personal history of left foot osteomyelitis in 2016. EXAM: RIGHT FOOT COMPLETE - 3+ VIEW COMPARISON:  None. FINDINGS: Soft tissue ulceration with gas in the tissues of the plantar surface underlying the head of the first metatarsal. No evidence of osteomyelitis. No evidence of acute fracture or dislocation. Osseous demineralization. Remote fracture involving the head of the third metatarsal with incomplete union. Hammertoe deformity. Joint space narrowing involving the IP joints of the toes and joints of the midfoot. IMPRESSION: 1. Soft tissue ulceration with gas in the tissues of the plantar surface of the foot underlying the head of the first metatarsal. No evidence of osteomyelitis. 2. Remote fracture involving the head of the third metatarsal with incomplete union. 3. Mild  osteoarthritis. 4. Osseous demineralization. Electronically Signed   By: Evangeline Dakin M.D.   On: 03/19/2017 21:24  ASSESSMENT AND PLAN:    84 year old female with a history of diabetes who presents with confusion.  1. Septic shock due to diabetic foot infection Continue pressors Keep MAP>60  2. Diabetic foot infection: X-ray does not show evidence of osteomyelitis Follow-up on podiatry consultation with her consultation Continue vancomycin and add Zosyn and place the cefepime  3. Diabetes: Continue management as per recommendations by diabetes RN   4. PAF: Continue amiodarone  5. Hyperlipidemia: Continue Zocor  6. Hypothyroidism: Continue Synthroid  7. Electrolyte abnormalities: This has been repleted  8. Elevated troponin: This is due to demand ischemia from septic shock. Patient is ruled out for ACS  9. Anemia of chronic disease: Follow CBC  10. Hyponatremia in the setting of septic shock Sodium is improving      Management plans discussed with the patient and she is in agreement.  CODE STATUS: DNR  TOTAL TIME TAKING CARE OF THIS PATIENT: 30 minutes.     POSSIBLE D/C ??, DEPENDING ON CLINICAL CONDITION.   Odes Lolli M.D on 03/20/2017 at 11:14 AM  Between 7am to 6pm - Pager - [phone] After 6pm go to [email or web address] - password [email or web address] [facility]  Office  [phone]  CC: Primary care physician; Rusty Aus, MD  Note: This dictation was prepared with Dragon dictation along with smaller phrase technology. Any transcriptional errors that result from this process are unintentional.

## 2017-03-20 NOTE — ED Notes (Signed)
Messaged pharmacy to send synthroid to ICU and that patient is moving to ICU 6

## 2017-03-20 NOTE — Consult Note (Signed)
Reason for Consult: Ulceration with cellulitis and gas in the tissues right foot. Referring Physician: [name] is an 84 y.o. female.  HPI: This is an 84 year old diabetic female with a long history of ulcerations and foot issues related to her diabetes.  Recently started to notice some increased redness and swelling and a sore on her right foot with some drainage.  Presented to the emergency department and was admitted due to sepsis.  Past Medical History:  Diagnosis Date  . Anemia   . Atrial fibrillation ([place])   . Cardiac arrest ([place])   . Cataract   . CHF (congestive heart failure) ([place])   . Diabetes mellitus without complication ([place])   . Edema    feet/ankles occas  . Hip fracture ([place])   . Hyperlipidemia   . Hypertension   . Hypothyroid   . Neuropathy   . Osteomyelitis ([place])    left first metatarsal  . Stroke [facility])     Past Surgical History:  Procedure Laterality Date  . ACHILLES TENDON SURGERY Left 02/08/2015   Procedure: ACHILLES LENGTHENING/KIDNER;  Surgeon: Albertine Patricia, DPM;  Location: [facility] ORS;  Service: Podiatry;  Laterality: Left;  . APPENDECTOMY    . CARDIAC CATHETERIZATION  08/25/13  . CATARACT EXTRACTION W/PHACO Left 07/20/2014   Procedure: CATARACT EXTRACTION PHACO AND INTRAOCULAR LENS PLACEMENT (IOC);  Surgeon: Leandrew Koyanagi, MD;  Location: [facility] ORS;  Service: Ophthalmology;  Laterality: Left;  [place]  1:18                 AP     23.6             CDE   9.69      lot #[id number]  . CHOLECYSTECTOMY    . CORONARY ANGIOPLASTY    . HALLUX VALGUS AKIN Left 02/08/2015   Procedure: HALLUX VALGUS AKIN/ KELLER;  Surgeon: Albertine Patricia, DPM;  Location: [facility] ORS;  Service: Podiatry;  Laterality: Left;  IVA with Local needs 1 hour for this case   . HEMIARTHROPLASTY HIP Right   . HEMIARTHROPLASTY HIP Left   . INCISION AND DRAINAGE Left 10/11/2014   Procedure: Removal of infected tibial sessmoid;  Surgeon: Albertine Patricia, DPM;  Location: [facility] ORS;   Service: Podiatry;  Laterality: Left;    Family History  Problem Relation Age of Onset  . Heart attack Mother     Social History:  reports that she quit smoking about 27 years ago. Her smoking use included cigarettes. She has a 3.50 pack-year smoking history. she has never used smokeless tobacco. She reports that she does not drink alcohol or use drugs.  Allergies:  Allergies  Allergen Reactions  . Carbamazepine Other (See Comments)    Reaction: unknown  . Codeine Other (See Comments)    Other reaction(s): Other (See Comments) Patient states she did not feel herself when she took it. unknown  . Cymbalta [Duloxetine Hcl] Other (See Comments)    Confusion, disorientation  . Duloxetine     Other reaction(s): Other (See Comments) Confusion, disorientation  . Lyrica [Pregabalin] Other (See Comments)    Patient and son states this makes the patient confused.    Medications:  Scheduled: . amiodarone  200 mg Oral Daily  . aspirin  81 mg Oral Daily  . heparin  5,000 Units Subcutaneous Q8H  . insulin aspart  0-15 Units Subcutaneous TID WC  . insulin aspart  0-5 Units Subcutaneous QHS  . insulin glargine  6 Units Subcutaneous Daily  .  levothyroxine  112 mcg Oral QAC breakfast  . ondansetron (ZOFRAN) IV  4 mg Intravenous Once  . simvastatin  40 mg Oral q1800    Results for orders placed or performed during the hospital encounter of 03/19/17 (from the past 48 hour(s))  Comprehensive metabolic panel     Status: Abnormal   Collection Time: 03/19/17  8:18 PM  Result Value Ref Range   Sodium 128 (L) 135 - 145 mmol/L   Potassium 2.7 (LL) 3.5 - 5.1 mmol/L    Comment: CRITICAL RESULT CALLED TO, READ BACK BY AND VERIFIED WITH LORIE LEMONS ON 03/19/17 AT 2116 JAG    Chloride 91 (L) 101 - 111 mmol/L   CO2 24 22 - 32 mmol/L   Glucose, Bld 343 (H) 65 - 99 mg/dL   BUN 44 (H) 6 - 20 mg/dL   Creatinine, Ser 1.56 (H) 0.44 - 1.00 mg/dL   Calcium 8.1 (L) 8.9 - 10.3 mg/dL   Total Protein 6.6  6.5 - 8.1 g/dL   Albumin 2.7 (L) 3.5 - 5.0 g/dL   AST 33 15 - 41 U/L   ALT 14 14 - 54 U/L   Alkaline Phosphatase 76 38 - 126 U/L   Total Bilirubin 0.7 0.3 - 1.2 mg/dL   GFR calc non Af Amer 30 (L) >60 mL/min   GFR calc Af Amer 35 (L) >60 mL/min    Comment: (NOTE) The eGFR has been calculated using the CKD EPI equation. This calculation has not been validated in all clinical situations. eGFR's persistently <60 mL/min signify possible Chronic Kidney Disease.    Anion gap 13 5 - 15    Comment: Performed at [facility], [address] [place], [place] [address]  CBC with Differential     Status: Abnormal   Collection Time: 03/19/17  8:18 PM  Result Value Ref Range   WBC 17.4 (H) 3.6 - 11.0 K/uL   RBC 4.02 3.80 - 5.20 MIL/uL   Hemoglobin 9.7 (L) 12.0 - 16.0 g/dL   HCT 30.5 (L) 35.0 - 47.0 %   MCV 75.8 (L) 80.0 - 100.0 fL   MCH 24.2 (L) 26.0 - 34.0 pg   MCHC 32.0 32.0 - 36.0 g/dL   RDW 22.7 (H) 11.5 - 14.5 %   Platelets 277 150 - 440 K/uL   Neutrophils Relative % 90 %   Neutro Abs 15.7 (H) 1.4 - 6.5 K/uL   Lymphocytes Relative 2 %   Lymphs Abs 0.3 (L) 1.0 - 3.6 K/uL   Monocytes Relative 8 %   Monocytes Absolute 1.3 (H) 0.2 - 0.9 K/uL   Eosinophils Relative 0 %   Eosinophils Absolute 0.0 0 - 0.7 K/uL   Basophils Relative 0 %   Basophils Absolute 0.0 0 - 0.1 K/uL    Comment: Performed at [facility], [address] [place], [place] [address]  Troponin I     Status: Abnormal   Collection Time: 03/19/17  8:18 PM  Result Value Ref Range   Troponin I 0.12 (HH) <0.03 ng/mL    Comment: CRITICAL RESULT CALLED TO, READ BACK BY AND VERIFIED WITH LORIE LEMONS ON 03/19/17 AT 2116 JAG Performed at [facility], [address] [place], [place] [address]   Influenza panel by PCR (type A & B)     Status: None   Collection Time: 03/19/17  8:18 PM  Result Value Ref Range   Influenza A By PCR NEGATIVE NEGATIVE   Influenza B By PCR NEGATIVE NEGATIVE  Comment: (NOTE) The Xpert Xpress Flu assay is intended as an aid in the diagnosis of  influenza and should not be used as a sole basis for treatment.  This  assay is FDA approved for nasopharyngeal swab specimens only. Nasal  washings and aspirates are unacceptable for Xpert Xpress Flu testing. Performed at [facility], [address] [place], [place] [address]   Lipase, blood     Status: None   Collection Time: 03/19/17  8:18 PM  Result Value Ref Range   Lipase 25 11 - 51 U/L    Comment: Performed at [facility], [address] [place], [place] [address]  Procalcitonin     Status: None   Collection Time: 03/19/17  8:18 PM  Result Value Ref Range   Procalcitonin 0.95 ng/mL    Comment:        Interpretation: PCT > 0.5 ng/mL and <= 2 ng/mL: Systemic infection (sepsis) is possible, but other conditions are known to elevate PCT as well. (NOTE)       Sepsis PCT Algorithm           Lower Respiratory Tract                                      Infection PCT Algorithm    ----------------------------     ----------------------------         PCT < 0.25 ng/mL                PCT < 0.10 ng/mL         Strongly encourage             Strongly discourage   discontinuation of antibiotics    initiation of antibiotics    ----------------------------     -----------------------------       PCT 0.25 - 0.50 ng/mL            PCT 0.10 - 0.25 ng/mL               OR       >80% decrease in PCT            Discourage initiation of                                            antibiotics      Encourage discontinuation           of antibiotics    ----------------------------     -----------------------------         PCT >= 0.50 ng/mL              PCT 0.26 - 0.50 ng/mL                AND       <80% decrease in PCT             Encourage initiation of                                             antibiotics       Encourage continuation           of antibiotics     ----------------------------     -----------------------------  PCT >= 0.50 ng/mL                  PCT > 0.50 ng/mL               AND         increase in PCT                  Strongly encourage                                      initiation of antibiotics    Strongly encourage escalation           of antibiotics                                     -----------------------------                                           PCT <= 0.25 ng/mL                                                 OR                                        > 80% decrease in PCT                                     Discontinue / Do not initiate                                             antibiotics Performed at [facility], [address] [place], [place] [address]   Protime-INR     Status: None   Collection Time: 03/19/17  8:18 PM  Result Value Ref Range   Prothrombin Time 14.4 11.4 - 15.2 seconds   INR 1.13     Comment: Performed at [facility], [address], [place], [place] [address]  Blood Culture (routine x 2)     Status: None (Preliminary result)   Collection Time: 03/19/17  8:18 PM  Result Value Ref Range   Specimen Description BLOOD RIGHT ARM    Special Requests      BOTTLES DRAWN AEROBIC AND ANAEROBIC Blood Culture adequate volume   Culture      NO GROWTH < 12 HOURS Performed at [facility], [address] [place], [place] [address]    Report Status PENDING   Urinalysis, Complete w Microscopic     Status: Abnormal   Collection Time: 03/19/17  8:20 PM  Result Value Ref Range   Color, Urine YELLOW (A) YELLOW   APPearance HAZY (A) CLEAR   Specific Gravity, Urine 1.012 1.005 - 1.030   pH 5.0 5.0 - 8.0   Glucose, UA 150 (A) NEGATIVE mg/dL  Hgb urine dipstick SMALL (A) NEGATIVE   Bilirubin Urine NEGATIVE NEGATIVE   Ketones, ur NEGATIVE NEGATIVE mg/dL   Protein, ur >=300 (A) NEGATIVE mg/dL   Nitrite NEGATIVE NEGATIVE   Leukocytes, UA NEGATIVE NEGATIVE    RBC / HPF 6-30 0 - 5 RBC/hpf   WBC, UA 6-30 0 - 5 WBC/hpf   Bacteria, UA RARE (A) NONE SEEN   Squamous Epithelial / LPF 0-5 (A) NONE SEEN   Mucus PRESENT    Budding Yeast PRESENT     Comment: Performed at [facility], [address] [place], [place] [address]  Lactic acid, plasma     Status: None   Collection Time: 03/19/17  8:20 PM  Result Value Ref Range   Lactic Acid, Venous 1.5 0.5 - 1.9 mmol/L    Comment: Performed at [facility], [address] [place], [place] [address]  Blood Culture (routine x 2)     Status: None (Preliminary result)   Collection Time: 03/19/17  8:30 PM  Result Value Ref Range   Specimen Description BLOOD RIGHT ARM    Special Requests      BOTTLES DRAWN AEROBIC AND ANAEROBIC Blood Culture adequate volume   Culture      NO GROWTH < 12 HOURS Performed at [facility], [address] [place] [employer] [place], [place] [address]    Report Status PENDING   Glucose, capillary     Status: Abnormal   Collection Time: 03/20/17  1:57 AM  Result Value Ref Range   Glucose-Capillary 251 (H) 65 - 99 mg/dL  Lactic acid, plasma     Status: None   Collection Time: 03/20/17  2:34 AM  Result Value Ref Range   Lactic Acid, Venous 0.8 0.5 - 1.9 mmol/L    Comment: Performed at [facility], [address] [place], [place] [address]  CBC     Status: Abnormal   Collection Time: 03/20/17  2:34 AM  Result Value Ref Range   WBC 20.0 (H) 3.6 - 11.0 K/uL   RBC 3.53 (L) 3.80 - 5.20 MIL/uL   Hemoglobin 8.5 (L) 12.0 - 16.0 g/dL   HCT 27.4 (L) 35.0 - 47.0 %   MCV 77.7 (L) 80.0 - 100.0 fL   MCH 24.2 (L) 26.0 - 34.0 pg   MCHC 31.1 (L) 32.0 - 36.0 g/dL   RDW 22.5 (H) 11.5 - 14.5 %   Platelets 274 150 - 440 K/uL    Comment: Performed at [facility], [address] [place], [place] [address]  Basic metabolic panel     Status: Abnormal   Collection Time: 03/20/17  2:34 AM  Result Value Ref Range   Sodium 131 (L) 135 - 145 mmol/L   Potassium  4.0 3.5 - 5.1 mmol/L   Chloride 100 (L) 101 - 111 mmol/L   CO2 20 (L) 22 - 32 mmol/L   Glucose, Bld 283 (H) 65 - 99 mg/dL   BUN 38 (H) 6 - 20 mg/dL   Creatinine, Ser 1.27 (H) 0.44 - 1.00 mg/dL   Calcium 7.4 (L) 8.9 - 10.3 mg/dL   GFR calc non Af Amer 38 (L) >60 mL/min   GFR calc Af Amer 45 (L) >60 mL/min    Comment: (NOTE) The eGFR has been calculated using the CKD EPI equation. This calculation has not been validated in all clinical situations. eGFR's persistently <60 mL/min signify possible Chronic Kidney Disease.    Anion gap 11 5 - 15    Comment: Performed  at [facility], [address] [place], [place] [address]  Troponin I     Status: Abnormal   Collection Time: 03/20/17  2:34 AM  Result Value Ref Range   Troponin I 0.12 (HH) <0.03 ng/mL    Comment: CRITICAL VALUE NOTED. VALUE IS CONSISTENT WITH PREVIOUSLY REPORTED/CALLED VALUE...[facility] Performed at [facility], [address] [place], [place] [address]   Glucose, capillary     Status: Abnormal   Collection Time: 03/20/17  8:17 AM  Result Value Ref Range   Glucose-Capillary 221 (H) 65 - 99 mg/dL  Troponin I     Status: Abnormal   Collection Time: 03/20/17  9:26 AM  Result Value Ref Range   Troponin I 0.11 (HH) <0.03 ng/mL    Comment: CRITICAL VALUE NOTED. VALUE IS CONSISTENT WITH PREVIOUSLY REPORTED/CALLED VALUE / [place] Performed at [facility], [address] [place], [place] [address]   MRSA PCR Screening     Status: None   Collection Time: 03/20/17  9:43 AM  Result Value Ref Range   MRSA by PCR NEGATIVE NEGATIVE    Comment:        The GeneXpert MRSA Assay (FDA approved for NASAL specimens only), is one component of a comprehensive MRSA colonization surveillance program. It is not intended to diagnose MRSA infection nor to guide or monitor treatment for MRSA infections. Performed at [facility], [address] [place], [place] [address]     Dg Chest 1  View  Result Date: 03/19/2017 CLINICAL DATA:  Short of breath EXAM: CHEST 1 VIEW COMPARISON:  10/15/2014 FINDINGS: Mild biapical scarring. No focal pulmonary opacity or pleural effusion. Stable cardiomediastinal silhouette with aortic atherosclerosis. No pneumothorax. Old right second rib fracture. IMPRESSION: No active disease. Electronically Signed   By: Donavan Foil M.D.   On: 03/19/2017 19:59   Dg Ankle Complete Right  Result Date: 03/19/2017 CLINICAL DATA:  Erythema. Evaluate for possible osteomyelitis. Personal history of left foot osteomyelitis in 2016. EXAM: RIGHT ANKLE - COMPLETE 3+ VIEW COMPARISON:  None. FINDINGS: Mild diffuse soft tissue swelling. Osseous demineralization. No evidence of acute fracture or dislocation. Ankle mortise intact with mild joint space narrowing. No evidence of osteomyelitis. IMPRESSION: 1. No acute osseous abnormality.  No evidence of osteomyelitis. 2. Osseous demineralization. 3. Mild osteoarthritis. Electronically Signed   By: Evangeline Dakin M.D.   On: 03/19/2017 21:21   Ct Head Wo Contrast  Result Date: 03/19/2017 CLINICAL DATA:  84 year old with progressively worsening confusion over the past 2 days. Patient fell yesterday. Initial encounter. EXAM: CT HEAD WITHOUT CONTRAST TECHNIQUE: Contiguous axial images were obtained from the base of the skull through the vertex without intravenous contrast. COMPARISON:  12/02/2014, 11/05/2014. FINDINGS: Brain: Ventricular system normal in size and appearance for age. Mild age related cortical atrophy, unchanged. Moderate changes of small vessel disease of the white matter diffusely, unchanged. No mass lesion. No midline shift. No acute hemorrhage or hematoma. No extra-axial fluid collections. No evidence of acute infarction. Vascular: Mild bilateral carotid siphon and vertebral artery atherosclerosis. No hyperdense vessel. Skull: Marked hyperostosis frontalis interna bilaterally. No skull fracture or other focal osseous  abnormality involving the skull. Sinuses/Orbits: Visualized paranasal sinuses, bilateral mastoid air cells and bilateral middle ear cavities well-aerated. Visualized orbits and globes intact. Hyperdense lens involving the right globe, query cataract. Other: None. IMPRESSION: 1. No acute intracranial abnormality. 2. Stable mild age related cortical atrophy and stable moderate chronic microvascular ischemic changes of the white matter. Electronically Signed  By: Evangeline Dakin M.D.   On: 03/19/2017 20:11   Dg Foot Complete Left  Result Date: 03/19/2017 CLINICAL DATA:  Erythema. Evaluate for possible osteomyelitis. Personal history of left foot osteomyelitis in 2016. EXAM: LEFT FOOT - COMPLETE 3+ VIEW COMPARISON:  Left foot MRI 10/10/2014 left foot x-rays 02/23/2012. FINDINGS: No evidence of acute fracture or dislocation. Osseous demineralization. Hammertoe deformity. Likely remote healed fracture involving the proximal phalanx of the second toe. No evidence of osteomyelitis. Narrowing of the IP joint spaces of the toes and narrowing of joint spaces in the midfoot, unchanged. Small plantar calcaneal spur. Small enthesopathic spur at the insertion of the Achilles tendon on the posterior calcaneus. IMPRESSION: 1. No acute osseous abnormality.  No evidence of osteomyelitis. 2. Osseous demineralization. 3. Mild osteoarthritis. Electronically Signed   By: Evangeline Dakin M.D.   On: 03/19/2017 21:20   Dg Foot Complete Right  Result Date: 03/19/2017 CLINICAL DATA:  Erythema. Evaluate for possible osteomyelitis. Personal history of left foot osteomyelitis in 2016. EXAM: RIGHT FOOT COMPLETE - 3+ VIEW COMPARISON:  None. FINDINGS: Soft tissue ulceration with gas in the tissues of the plantar surface underlying the head of the first metatarsal. No evidence of osteomyelitis. No evidence of acute fracture or dislocation. Osseous demineralization. Remote fracture involving the head of the third metatarsal with incomplete  union. Hammertoe deformity. Joint space narrowing involving the IP joints of the toes and joints of the midfoot. IMPRESSION: 1. Soft tissue ulceration with gas in the tissues of the plantar surface of the foot underlying the head of the first metatarsal. No evidence of osteomyelitis. 2. Remote fracture involving the head of the third metatarsal with incomplete union. 3. Mild osteoarthritis. 4. Osseous demineralization. Electronically Signed   By: Evangeline Dakin M.D.   On: 03/19/2017 21:24    Review of Systems  Constitutional: Positive for fever. Negative for chills and malaise/fatigue.  HENT: Negative.   Eyes: Negative.   Respiratory: Negative.   Cardiovascular: Negative.   Gastrointestinal: Negative for nausea and vomiting.  Genitourinary: Negative.   Musculoskeletal:       Some pain in the right foot.  Skin:       Patient relates a recent draining ulceration on her right foot for the last couple of weeks.  Significant increase in redness and swelling in the right leg recently.  Neurological:       Patient relates significant numbness and neuropathy from her diabetes.  Endo/Heme/Allergies: Negative.   Psychiatric/Behavioral: Negative.    Blood pressure 99/69, pulse 83, temperature 97.9 F (36.6 C), temperature source Oral, resp. rate (!) 24, height _0  (1.651 m), weight 64.8 kg (142 lb 13.7 oz), SpO2 92 %. Physical Exam  Cardiovascular:  DP pulse is 2/4 bilateral.  PT pulses 1/4 bilateral.  Musculoskeletal:  Adequate range of motion of the pedal joints.  There is some pain on palpation around the ulcerative site on the right forefoot.  Neurological:  Loss of protective threshold with a monofilament wire bilaterally.  Skin:  The skin is warm dry and atrophic bilateral.  Significant edema and erythema in the entire right lower extremity.  Full-thickness ulceration on the plantar aspect of the right foot beneath the first metatarsal head with some drainage.  Some air in the drainage  is noted upon palpation of the plantar tissues.    Assessment/Plan: Assessment: 1.  Cellulitis with abscess right foot with gas in the tissues. 2.  Diabetes with associated neuropathy.  Plan: Discussed with the patient the need  for debridement of the infection in her right foot.  We will obtain an MRI for evaluation of the extent of infection for any possible early bone or joint involvement.  Discussed with the patient that pending the results of her MRI this may require a more extensive debridement possibly including amputation of the great toe.  Obtain consent form for debridement of soft tissue and/or bone right foot with possible first ray resection.  N.p.o. after midnight.  At this point we will plan for surgery tomorrow morning if cleared  [name] 03/20/2017, 11:26 AM

## 2017-03-20 NOTE — H&P (Signed)
[facility]  PATIENT NAME: Sharon [name]   MR#:  [id number] DATE OF BIRTH:  08-03-1933 DATE OF ADMISSION:  03/19/2017 PRIMARY CARE PHYSICIAN: Rusty Aus, MD  REQUESTING/REFERRING PHYSICIAN: Mable Paris, MD  CHIEF COMPLAINT:   Admitted for Sepsis  HISTORY OF PRESENT ILLNESS:  Sharon [name]  is a 84 y.o. female who presents with confusion and lethargy.  Patient found to be hypotensive with LA 1.5 Placed on Levo infusion Patient is DNR/DNI status  right foot diabetic ulcer with surrounding cellulitis. Patient states she has problems breathing HR 102 02 sat 90% on 2 l [place] +wheezing noted on exam  PAST MEDICAL HISTORY:   Past Medical History:  Diagnosis Date  . Anemia   . Atrial fibrillation ([place])   . Cardiac arrest ([place])   . Cataract   . CHF (congestive heart failure) ([place])   . Diabetes mellitus without complication ([place])   . Edema    feet/ankles occas  . Hip fracture ([place])   . Hyperlipidemia   . Hypertension   . Hypothyroid   . Neuropathy   . Osteomyelitis ([place])    left first metatarsal  . Stroke ([place])      PAST SURGICAL HISTORY:   Past Surgical History:  Procedure Laterality Date  . ACHILLES TENDON SURGERY Left 02/08/2015   Procedure: ACHILLES LENGTHENING/KIDNER;  Surgeon: Albertine Patricia, DPM;  Location: [facility];  Service: Podiatry;  Laterality: Left;  . APPENDECTOMY    . CARDIAC CATHETERIZATION  08/25/13  . CATARACT EXTRACTION W/PHACO Left 07/20/2014   Procedure: CATARACT EXTRACTION PHACO AND INTRAOCULAR LENS PLACEMENT (IOC);  Surgeon: Leandrew Koyanagi, MD;  Location: [facility];  Service: Ophthalmology;  Laterality: Left;  [place]  1:18                 AP     23.6             CDE   9.69      lot #[id number]  . CHOLECYSTECTOMY    . CORONARY ANGIOPLASTY    . HALLUX VALGUS AKIN Left 02/08/2015   Procedure: HALLUX VALGUS AKIN/ KELLER;  Surgeon: Albertine Patricia, DPM;  Location: [facility];  Service: Podiatry;  Laterality: Left;  IVA with Local needs 1  hour for this case   . HEMIARTHROPLASTY HIP Right   . HEMIARTHROPLASTY HIP Left   . INCISION AND DRAINAGE Left 10/11/2014   Procedure: Removal of infected tibial sessmoid;  Surgeon: Albertine Patricia, DPM;  Location: [facility];  Service: Podiatry;  Laterality: Left;     SOCIAL HISTORY:   Social History   Tobacco Use  . Smoking status: Former Smoker    Packs/day: 0.50    Years: 7.00    Pack years: 3.50    Types: Cigarettes    Last attempt to quit: 05/30/1989    Years since quitting: 27.8  . Smokeless tobacco: Never Used  Substance Use Topics  . Alcohol use: No    Alcohol/week: 0.0 oz     FAMILY HISTORY:   Family History  Problem Relation Age of Onset  . Heart attack Mother      DRUG ALLERGIES:   Allergies  Allergen Reactions  . Carbamazepine Other (See Comments)    Reaction: unknown  . Codeine Other (See Comments)    Other reaction(s): Other (See Comments) Patient states she did not feel herself when she took it. unknown  . Cymbalta [Duloxetine Hcl] Other (See Comments)    Confusion, disorientation  . Duloxetine     Other  reaction(s): Other (See Comments) Confusion, disorientation  . Lyrica [Pregabalin] Other (See Comments)    Patient and son states this makes the patient confused.   ROS limited due to confusion   MEDICATIONS AT HOME:   Prior to Admission medications   Medication Sig Start Date End Date Taking? Authorizing Provider  acetaminophen (TYLENOL) 500 MG tablet Take 500 mg by mouth every 4 (four) hours as needed for mild pain or moderate pain.    [provider]  amiodarone (PACERONE) 200 MG tablet Take one tablet by mouth once daily for AFIB    [provider]  aspirin 81 MG tablet Take 81 mg by mouth daily.    [provider]  cholecalciferol (VITAMIN D) 1000 units tablet Take 1,000 Units by mouth daily.    [provider]  furosemide (LASIX) 20 MG tablet Take 20 mg by mouth daily.    [provider]   gabapentin (NEURONTIN) 600 MG tablet 600 mg 4 (four) times daily. Take one tablet by mouth twice daily for neuropathy     [provider]  glimepiride (AMARYL) 2 MG tablet Take 2 mg by mouth daily with breakfast.    [provider]  GLIPIZIDE XL 2.5 MG 24 hr tablet Take 2.5 mg by mouth daily. Reported on 02/07/2015 10/14/14   [provider]  levothyroxine (SYNTHROID, LEVOTHROID) 112 MCG tablet Take one tablet by mouth once daily 30 minutes before breakfast once daily for hypothyroid    [provider]  lisinopril (PRINIVIL,ZESTRIL) 10 MG tablet Take 10 mg by mouth daily.    [provider]  metolazone (ZAROXOLYN) 2.5 MG tablet Take 2.5 mg by mouth every other day.    [provider]  Multiple Vitamins-Minerals (PRESERVISION AREDS 2+MULTI VIT PO) Take by mouth.    [provider]  simvastatin (ZOCOR) 40 MG tablet Take one tablet by mouth once daily at bedtime for cholesterol    [provider]    REVIEW OF SYSTEMS:  Review of Systems  Constitutional: Positive for fever. Negative for chills, malaise/fatigue and weight loss.  HENT: Negative for ear pain, hearing loss and tinnitus.   Eyes: Negative for blurred vision, double vision, pain and redness.  Respiratory: Positive for shortness of breath. Negative for cough and hemoptysis.   Cardiovascular: Negative for chest pain, palpitations, orthopnea and leg swelling.  Gastrointestinal: Negative for abdominal pain, constipation, diarrhea, nausea and vomiting.  Genitourinary: Negative for dysuria, frequency and hematuria.  Musculoskeletal: Negative for back pain, joint pain and neck pain.  Skin:       Right foot erythema and ulcerated wound  Neurological: Negative for dizziness, tremors, focal weakness and weakness.  Endo/Heme/Allergies: Negative for polydipsia. Does not bruise/bleed easily.  Psychiatric/Behavioral: Negative for depression. The patient is nervous/anxious. The  patient does not have insomnia.      VITAL SIGNS:   Vitals:   03/20/17 0745 03/20/17 0800 03/20/17 0900 03/20/17 1000  BP: 119/66 112/73 105/70 99/69  Pulse: 72 70 79 83  Resp: 20 (!) 23  (!) 24  Temp: 97.8 F (36.6 C) 98 F (36.7 C) 97.9 F (36.6 C)   TempSrc:   Oral   SpO2: 94% 95% 94% 92%  Weight:   142 lb 13.7 oz (64.8 kg)   Height:   5\' 5"  (1.651 m)    Wt Readings from Last 3 Encounters:  03/20/17 142 lb 13.7 oz (64.8 kg)  03/03/17 140 lb 9 oz (63.8 kg)  02/03/17 140 lb 9.6 oz (  63.8 kg)    PHYSICAL EXAMINATION:  Physical Exam  Vitals reviewed. Constitutional: She is oriented to person, place, and time. She appears well-developed and well-nourished. No distress.  HENT:  Head: Normocephalic and atraumatic.  Mouth/Throat: Oropharynx is clear and moist.  Eyes: Conjunctivae and EOM are normal. Pupils are equal, round, and reactive to light. No scleral icterus.  Neck: Normal range of motion. Neck supple. No JVD present. No thyromegaly present.  Cardiovascular: Regular rhythm and intact distal pulses. Exam reveals no gallop and no friction rub.  No murmur heard. tachycardic  Respiratory: Effort normal and breath sounds normal. No respiratory distress. She has no wheezes. She has no rales.  GI: Soft. Bowel sounds are normal. She exhibits no distension. There is no tenderness.  Musculoskeletal: Normal range of motion. She exhibits no edema.  No arthritis, no gout  Lymphadenopathy:    She has no cervical adenopathy.  Neurological: She is alert and oriented to person, place, and time. No cranial nerve deficit.  No dysarthria, no aphasia  Skin: Skin is warm and dry. No rash noted. There is erythema (With warmth, and plantar ulcerated wound without drainage.).  Psychiatric: She has a normal mood and affect. Her behavior is normal. Judgment and thought content normal.    LABORATORY PANEL:   CBC Recent Labs  Lab 03/20/17 0234  WBC 20.0*  HGB 8.5*  HCT 27.4*  PLT 274    ------------------------------------------------------------------------------------------------------------------  Chemistries  Recent Labs  Lab 03/19/17 2018 03/20/17 0234  NA 128* 131*  K 2.7* 4.0  CL 91* 100*  CO2 24 20*  GLUCOSE 343* 283*  BUN 44* 38*  CREATININE 1.56* 1.27*  CALCIUM 8.1* 7.4*  AST 33  --   ALT 14  --   ALKPHOS 76  --   BILITOT 0.7  --     IMPRESSION AND PLAN:  84 yo white female with sepsis from RT foot ulcer and cellulitis  1.Septic Shock ([place]) -IV antibiotics initiated.  Lactic acid was within normal limits, blood pressure was initially okay, but then fell.    levophed started-map goal 55, patient is very thin and frail Follow up cultures LA now WNL  2.  Diabetic foot infection ([place]) -this is the most likely cause of sepsis as her other workup does not show any other infectious etiology at this time.   IV antibiotics started, podiatry consult ordered.  X-ray imaging does not indicate osteomyelitis MRI pending  3. Chronic diastolic heart failure ([place])   4.  DM (diabetes mellitus), type 2, uncontrolled ([place]) -sliding scale insulin with corresponding glucose checks  5. CKD (chronic kidney disease), stage III ([place]) -patient's creatinine is very close to baseline, we will avoid nephrotoxins and monitor  .  DVT PROPHYLAXIS: SubQ heparin   Corrin Parker, M.D.  [facility]  Medical Director [place] Director [facility] Cardio-Pulmonary Department

## 2017-03-20 NOTE — Progress Notes (Signed)
Family Meeting Note  Advance Directive:yes  Today a meeting took place with the Patient. The following clinical team members were present during this meeting:MD  RN The following were discussed:Patient's diagnosis: Septic shock diabetic foot ulcer Electrolyte abnormalities resolved   , Patient's progosis: Unable to determine and Goals for treatment: DNR  Additional follow-up to be provided: DO NOT RESUSCITATE order written in place  Time spent during discussion:16 minutes  Sarahann Horrell, MD

## 2017-03-20 NOTE — ED Notes (Addendum)
Attempted to call report. Spoke to [employer], Camera operator

## 2017-03-20 NOTE — ED Notes (Signed)
Lovena Le, Charge RN, called for report.

## 2017-03-20 NOTE — Progress Notes (Signed)
Inpatient Diabetes Program Recommendations  AACE/ADA: New Consensus Statement on Inpatient Glycemic Control (2015)  Target Ranges:  Prepandial:   less than 140 mg/dL      Peak postprandial:   less than 180 mg/dL (1-2 hours)      Critically ill patients:  140 - 180 mg/dL    Results for Sharon [name], Sharon [name] (MRN [id number]) as of 03/20/2017 10:05  Ref. Range 03/20/2017 01:57 03/20/2017 08:17  Glucose-Capillary Latest Ref Range: 65 - 99 mg/dL 251 (H)  3 units Novolog  221 (H)  3 units Novolog   Admit: Sepsis/ Foot Infection  History: DM, CKD  Home DM Meds: Amaryl 2 mg daily  Current Orders: Novolog Sensitive Correction Scale/ SSI (0-9 units) TID AC + HS     Note patient allowed solid PO diet.  CBGs >200 mg/dl since admission.  PO oral DM meds currently on hold.   MD- Please consider the following in-hospital insulin adjustments if patient continues to have elevated CBGs:  1. Start low dose basal insulin: Lantus 6 units daily (0.1 units/kg dosing)  2. Increase Novolog SSI to Moderate scale (0-15 units) TID AC + HS     --Will follow patient during hospitalization--  Wyn Quaker RN, MSN, CDE Diabetes Coordinator Inpatient Glycemic Control Team Team Pager: [phone] (8a-5p)

## 2017-03-21 ENCOUNTER — Encounter
Admission: EM | Disposition: A | Discharge status: Skilled Nursing Facility | Payer: Self-pay | Source: Home / Self Care | Attending: Internal Medicine

## 2017-03-21 ENCOUNTER — Inpatient Hospital Stay: Payer: PPO | Admitting: Anesthesiology

## 2017-03-21 ENCOUNTER — Encounter: Payer: Self-pay | Admitting: Anesthesiology

## 2017-03-21 ENCOUNTER — Other Ambulatory Visit: Payer: Self-pay

## 2017-03-21 HISTORY — PX: IRRIGATION AND DEBRIDEMENT FOOT: SHX6602

## 2017-03-21 LAB — HEMOGLOBIN A1C
HEMOGLOBIN A1C: 7.1 % — AB (ref 4.8–5.6)
Hgb A1c MFr Bld: 7.3 % — ABNORMAL HIGH (ref 4.8–5.6)
MEAN PLASMA GLUCOSE: 157.07 mg/dL
Mean Plasma Glucose: 163 mg/dL

## 2017-03-21 LAB — GLUCOSE, CAPILLARY
GLUCOSE-CAPILLARY: 316 mg/dL — AB (ref 65–99)
GLUCOSE-CAPILLARY: 372 mg/dL — AB (ref 65–99)
GLUCOSE-CAPILLARY: 215 mg/dL — AB (ref 65–99)
Glucose-Capillary: 306 mg/dL — ABNORMAL HIGH (ref 65–99)
Glucose-Capillary: 292 mg/dL — ABNORMAL HIGH (ref 65–99)

## 2017-03-21 LAB — PROCALCITONIN: PROCALCITONIN: 1.08 ng/mL

## 2017-03-21 SURGERY — IRRIGATION AND DEBRIDEMENT FOOT
Anesthesia: General | Laterality: Right | Wound class: Dirty or Infected

## 2017-03-21 MED ORDER — IPRATROPIUM-ALBUTEROL 0.5-2.5 (3) MG/3ML IN SOLN
3.0000 mL | Freq: Four times a day (QID) | RESPIRATORY_TRACT | Status: DC
  Administered 2017-03-21: 3 mL via RESPIRATORY_TRACT
  Filled 2017-03-21: qty 3

## 2017-03-21 MED ORDER — FENTANYL CITRATE (PF) 100 MCG/2ML IJ SOLN
INTRAMUSCULAR | Status: DC | PRN
  Administered 2017-03-21: 25 ug via INTRAVENOUS

## 2017-03-21 MED ORDER — PHENYLEPHRINE HCL 10 MG/ML IJ SOLN
INTRAMUSCULAR | Status: DC | PRN
  Administered 2017-03-21 (×2): 100 ug via INTRAVENOUS

## 2017-03-21 MED ORDER — DILTIAZEM HCL 25 MG/5ML IV SOLN
10.0000 mg | Freq: Once | INTRAVENOUS | Status: AC
  Administered 2017-03-21: 10 mg via INTRAVENOUS
  Filled 2017-03-21 (×2): qty 5

## 2017-03-21 MED ORDER — INSULIN GLARGINE 100 UNIT/ML ~~LOC~~ SOLN
10.0000 [IU] | Freq: Every day | SUBCUTANEOUS | Status: DC
  Administered 2017-03-22 – 2017-03-23 (×2): 10 [IU] via SUBCUTANEOUS
  Filled 2017-03-21 (×3): qty 0.1

## 2017-03-21 MED ORDER — BUPIVACAINE HCL (PF) 0.5 % IJ SOLN
INTRAMUSCULAR | Status: AC
  Filled 2017-03-21: qty 30

## 2017-03-21 MED ORDER — METOPROLOL TARTRATE 25 MG PO TABS
25.0000 mg | ORAL_TABLET | Freq: Two times a day (BID) | ORAL | Status: DC
  Administered 2017-03-21 – 2017-03-22 (×2): 25 mg via ORAL
  Filled 2017-03-21 (×2): qty 1

## 2017-03-21 MED ORDER — FENTANYL CITRATE (PF) 100 MCG/2ML IJ SOLN: 25.0000 ug | INTRAMUSCULAR | Status: DC | PRN

## 2017-03-21 MED ORDER — GENTAMICIN SULFATE 40 MG/ML IJ SOLN
INTRAMUSCULAR | Status: AC
  Filled 2017-03-21: qty 2

## 2017-03-21 MED ORDER — FENTANYL CITRATE (PF) 100 MCG/2ML IJ SOLN
INTRAMUSCULAR | Status: AC
  Filled 2017-03-21: qty 2

## 2017-03-21 MED ORDER — ONDANSETRON HCL 4 MG/2ML IJ SOLN: 4.0000 mg | Freq: Once | INTRAMUSCULAR | Status: DC | PRN

## 2017-03-21 MED ORDER — PROPOFOL 10 MG/ML IV BOLUS
INTRAVENOUS | Status: DC | PRN
  Administered 2017-03-21: 90 mg via INTRAVENOUS

## 2017-03-21 MED ORDER — SIMETHICONE 80 MG PO CHEW
80.0000 mg | CHEWABLE_TABLET | Freq: Four times a day (QID) | ORAL | Status: DC | PRN
  Administered 2017-03-21: 80 mg via ORAL
  Filled 2017-03-21 (×2): qty 1

## 2017-03-21 MED ORDER — LIDOCAINE HCL (CARDIAC) 20 MG/ML IV SOLN
INTRAVENOUS | Status: DC | PRN
  Administered 2017-03-21: 100 mg via INTRAVENOUS

## 2017-03-21 MED ORDER — BUPIVACAINE HCL (PF) 0.5 % IJ SOLN
INTRAMUSCULAR | Status: DC | PRN
  Administered 2017-03-21: 10 mL

## 2017-03-21 MED ORDER — ONDANSETRON HCL 4 MG/2ML IJ SOLN
INTRAMUSCULAR | Status: DC | PRN
  Administered 2017-03-21: 4 mg via INTRAVENOUS

## 2017-03-21 MED ORDER — VANCOMYCIN HCL 1000 MG IV SOLR
INTRAVENOUS | Status: AC
  Filled 2017-03-21: qty 1000

## 2017-03-21 MED ORDER — SODIUM CHLORIDE 0.9 % IV SOLN
INTRAVENOUS | Status: DC | PRN
  Administered 2017-03-21: 08:00:00 via INTRAVENOUS

## 2017-03-21 MED ORDER — PROPOFOL 10 MG/ML IV BOLUS
INTRAVENOUS | Status: AC
  Filled 2017-03-21: qty 20

## 2017-03-21 MED ORDER — IPRATROPIUM-ALBUTEROL 0.5-2.5 (3) MG/3ML IN SOLN
3.0000 mL | RESPIRATORY_TRACT | Status: DC | PRN
  Administered 2017-03-21: 3 mL via RESPIRATORY_TRACT
  Filled 2017-03-21: qty 3

## 2017-03-21 SURGICAL SUPPLY — 46 items
BANDAGE ELASTIC 4 LF NS (GAUZE/BANDAGES/DRESSINGS) ×3 IMPLANT
BLADE OSCILLATING/SAGITTAL (BLADE)
BLADE SURG 15 STRL LF DISP TIS (BLADE) ×1 IMPLANT
BLADE SURG 15 STRL SS (BLADE) ×2
BLADE SW THK.38XMED LNG THN (BLADE) IMPLANT
BNDG ESMARK 4X12 TAN STRL LF (GAUZE/BANDAGES/DRESSINGS) ×3 IMPLANT
BNDG GAUZE 4.5X4.1 6PLY STRL (MISCELLANEOUS) ×3 IMPLANT
CANISTER SUCT 1200ML W/VALVE (MISCELLANEOUS) ×3 IMPLANT
CUFF TOURN 18 STER (MISCELLANEOUS) ×3 IMPLANT
CUFF TOURN DUAL PL 12 NO SLV (MISCELLANEOUS) ×3 IMPLANT
DRAPE FLUOR MINI C-ARM 54X84 (DRAPES) IMPLANT
DURAPREP 26ML APPLICATOR (WOUND CARE) ×3 IMPLANT
ELECT REM PT RETURN 9FT ADLT (ELECTROSURGICAL) ×3
ELECTRODE REM PT RTRN 9FT ADLT (ELECTROSURGICAL) ×1 IMPLANT
GAUZE IODOFORM PACK 1/2 7832 (GAUZE/BANDAGES/DRESSINGS) ×3 IMPLANT
GAUZE PETRO XEROFOAM 1X8 (MISCELLANEOUS) ×3 IMPLANT
GAUZE SPONGE 4X4 12PLY STRL (GAUZE/BANDAGES/DRESSINGS) ×3 IMPLANT
GAUZE STRETCH 2X75IN STRL (MISCELLANEOUS) ×3 IMPLANT
GAUZE XEROFORM 4X4 STRL (GAUZE/BANDAGES/DRESSINGS) ×3 IMPLANT
GLOVE BIO SURGEON STRL SZ7.5 (GLOVE) ×3 IMPLANT
GLOVE INDICATOR 8.0 STRL GRN (GLOVE) ×3 IMPLANT
GOWN STRL REUS W/ TWL LRG LVL3 (GOWN DISPOSABLE) ×2 IMPLANT
GOWN STRL REUS W/TWL LRG LVL3 (GOWN DISPOSABLE) ×4
HANDPIECE VERSAJET DEBRIDEMENT (MISCELLANEOUS) ×3 IMPLANT
KIT STIMULAN RAPID CURE 5CC (Orthopedic Implant) ×3 IMPLANT
LABEL OR SOLS (LABEL) ×3 IMPLANT
NEEDLE FILTER BLUNT 18X 1/2SAF (NEEDLE) ×2
NEEDLE FILTER BLUNT 18X1 1/2 (NEEDLE) ×1 IMPLANT
NEEDLE HYPO 25X1 1.5 SAFETY (NEEDLE) ×9 IMPLANT
NS IRRIG 500ML POUR BTL (IV SOLUTION) ×3 IMPLANT
PACK EXTREMITY ARMC (MISCELLANEOUS) ×3 IMPLANT
PAD ABD DERMACEA PRESS 5X9 (GAUZE/BANDAGES/DRESSINGS) ×6 IMPLANT
PENCIL ELECTRO HAND CTR (MISCELLANEOUS) ×3 IMPLANT
RASP SM TEAR CROSS CUT (RASP) IMPLANT
SOL PREP PVP 2OZ (MISCELLANEOUS) ×3
SOLUTION PREP PVP 2OZ (MISCELLANEOUS) ×1 IMPLANT
STOCKINETTE STRL 4IN 9604848 (GAUZE/BANDAGES/DRESSINGS) ×3 IMPLANT
STOCKINETTE STRL 6IN 960660 (GAUZE/BANDAGES/DRESSINGS) ×3 IMPLANT
SUT ETHILON 4-0 (SUTURE) ×2
SUT ETHILON 4-0 FS2 18XMFL BLK (SUTURE) ×1
SUT VIC AB 3-0 SH 27 (SUTURE) ×2
SUT VIC AB 3-0 SH 27X BRD (SUTURE) ×1 IMPLANT
SUT VIC AB 4-0 FS2 27 (SUTURE) ×3 IMPLANT
SUTURE ETHLN 4-0 FS2 18XMF BLK (SUTURE) ×1 IMPLANT
SYR 10ML LL (SYRINGE) ×6 IMPLANT
SYR 3ML LL SCALE MARK (SYRINGE) ×3 IMPLANT

## 2017-03-21 NOTE — Interval H&P Note (Signed)
History and Physical Interval Note:  03/21/2017 8:02 AM  Sharon [name]  has presented today for surgery, with the diagnosis of n/a  The various methods of treatment have been discussed with the patient and family. After consideration of risks, benefits and other options for treatment, the patient has consented to  Procedure(s): IRRIGATION AND DEBRIDEMENT FOOT (Right) as a surgical intervention .  The patient's history has been reviewed, patient examined, no change in status, stable for surgery.  I have reviewed the patient's chart and labs.  Questions were answered to the patient's satisfaction.     [name]

## 2017-03-21 NOTE — Anesthesia Post-op Follow-up Note (Signed)
Anesthesia QCDR form completed.        

## 2017-03-21 NOTE — Progress Notes (Signed)
Pt off unit for debridement.

## 2017-03-21 NOTE — Transfer of Care (Signed)
Immediate Anesthesia Transfer of Care Note  Patient: Sharon [name]  Procedure(s) Performed: IRRIGATION AND DEBRIDEMENT FOOT right great toe amputation (Right )  Patient Location: PACU  Anesthesia Type:General  Level of Consciousness: awake  Airway & Oxygen Therapy: Patient connected to face mask oxygen  Post-op Assessment: Post -op Vital signs reviewed and stable  Post vital signs: stable  Last Vitals:  Vitals:   03/21/17 0406 03/21/17 0949  BP: 108/61 (!) 149/90  Pulse: 84 (!) 137  Resp: 19 (!) 27  Temp: 36.5 C   SpO2: 99% 99%    Last Pain:  Vitals:   03/21/17 0406  TempSrc: Oral  PainSc:          Complications: No apparent anesthesia complications

## 2017-03-21 NOTE — Op Note (Addendum)
Date of operation: 03/21/2017.  Surgeon: [name] D.P.M.  Preoperative diagnosis: Full-thickness ulceration right foot with gas containing abscess and osteomyelitis.  Postoperative diagnosis: Same.  Procedures: 1 amputation right hallux with partial first ray resection.                      2.  I&D right midfoot.  Anesthesia: LMA.  Hemostasis: Pneumatic tourniquet right ankle 250 mmHg.  Estimated blood loss: Less than 5 cc.  Cultures: Deep wound cultures right great toe.  Pathology: Right great toe and partial first metatarsal.  Implants: Stimulan rapid cure antibiotic beads impregnated with vancomycin and gentamicin.  Injectables: 10 cc 0.5% bupivacaine plain postoperatively.  Drains: Iodoform gauze forefoot and mid foot.  Complications: None apparent.  Operative indications: This is an 84 year old female recently hospitalized for sepsis with chronic ulcerations on the feet.  X-rays showed gas in the tissues and MRI confirmed evidence for osteomyelitis in the toe and first metatarsal.  Operative procedure: Patient was taken to the operating room and placed on the table in the supine position.  Following satisfactory LMA anesthesia a pneumatic tourniquet was applied at the level of the right ankle.  The foot was then prepped and draped in the usual sterile fashion.      Attention was directed to the distal aspect of the right foot where a racquet type incision was made coursing medially along the first metatarsal around the dorsal and plantar base of the great toe ending in the first interspace.  The incision was carried sharply down to the level of bone.  Some significant purulence was encountered on the incision around the plantar aspect of the hallux as well as beneath the great toe joint area.  Periosteal dissection carried back to the level of the mid first metatarsal where the bone was incised using a sagittal saw and the distal first metatarsal and entire first ray was  removed in toto.  Significant purulent and devitalized tissue was noted around the base of the wound and the plantar ulceration.  There also was noted to be some purulence tracking along the plantar tendon sheaths.  At this point the plantar tendon sheath was identified more proximally by inserting a hemostat and a secondary incision approximately 1.5 cm was made in the medial arch connecting with the original wound.  The entire wound was then thoroughly debrided with a versa jet debrider on a setting of 3-4 and then fully irrigated with the versa jet on a setting of 2.  The wound was then flushed with copious amounts of sterile saline using a bulb syringe.  The proximal arch incision was partially closed using 3-0 nylon simple interrupted sutures and an iodoform gauze was packed into the central aspect.  Antibiotic beads were then placed into the proximal aspect of the wound and the remainder of the wound was closed using 3-0 nylon simple interrupted with one vertical mattress suture.  The plantar ulceration was partially reapproximated using 3-0 nylon simple interrupted sutures to keep the beads intact.  10 cc of 0.5% bupivacaine plain were then injected postoperatively for analgesia.  Xeroform 4 x 4's ABDs and Kerlix were applied to the right foot.  The tourniquet was released and blood flow noted to return to the right foot and digits.  A second Kerlix and Ace wrap were then applied for compression.  Patient was awakened and taken to the PACU with vital signs stable and in good condition.

## 2017-03-21 NOTE — Progress Notes (Signed)
[facility] [place] NAME: Sharon [name]    MR#:  [id number]  DATE OF BIRTH:  October 09, 1933  SUBJECTIVE:  CHIEF COMPLAINT:   Chief Complaint  Patient presents with  . Drug Overdose   - patient with right foot infection, status post right first toe partial ray amputation today. Denies any pain at this time - using 2l o2 which is acute  REVIEW OF SYSTEMS:  Review of Systems  Constitutional: Positive for malaise/fatigue. Negative for chills and fever.  HENT: Negative for congestion, ear discharge, hearing loss and nosebleeds.   Eyes: Negative for blurred vision and double vision.  Respiratory: Negative for cough, shortness of breath and wheezing.   Cardiovascular: Negative for chest pain, palpitations and leg swelling.  Gastrointestinal: Negative for abdominal pain, constipation, diarrhea, nausea and vomiting.  Genitourinary: Negative for dysuria.  Musculoskeletal: Positive for joint pain and myalgias.  Neurological: Negative for dizziness, speech change, focal weakness, seizures and headaches.  Psychiatric/Behavioral: Negative for depression.    DRUG ALLERGIES:   Allergies  Allergen Reactions  . Carbamazepine Other (See Comments)    Reaction: unknown  . Codeine Other (See Comments)    Other reaction(s): Other (See Comments) Patient states she did not feel herself when she took it. unknown  . Cymbalta [Duloxetine Hcl] Other (See Comments)    Confusion, disorientation  . Duloxetine     Other reaction(s): Other (See Comments) Confusion, disorientation  . Lyrica [Pregabalin] Other (See Comments)    Patient and son states this makes the patient confused.    VITALS:  Blood pressure (!) 155/65, pulse (!) 111, temperature 97.8 F (36.6 C), temperature source Oral, resp. rate 16, height 5\' 5"  (1.651 m), weight 64.8 kg (142 lb 13.7 oz), SpO2 99 %.  PHYSICAL EXAMINATION:  Physical Exam  GENERAL:  84 y.o.-year-old elderly patient  lying in the bed with no acute distress.  EYES: Pupils equal, round, reactive to light and accommodation. No scleral icterus. Extraocular muscles intact.  HEENT: Head atraumatic, normocephalic. Oropharynx and nasopharynx clear.  NECK:  Supple, no jugular venous distention. No thyroid enlargement, no tenderness.  LUNGS: Normal breath sounds bilaterally, no wheezing, rales,rhonchi or crepitation. No use of accessory muscles of respiration. Decreased at the bases CARDIOVASCULAR: S1, S2 normal. No rubs, or gallops. 2/6 systolic murmur present ABDOMEN: Soft, nontender, nondistended. Bowel sounds present. No organomegaly or mass.  EXTREMITIES: right foot in dressing. No pedal edema, cyanosis, or clubbing.  NEUROLOGIC: Cranial nerves II through XII are intact. Muscle strength 5/5 in all extremities. Sensation intact. Gait not checked.  PSYCHIATRIC: The patient is alert and oriented x 3.  SKIN: No obvious rash, lesion, or ulcer.    LABORATORY PANEL:   CBC Recent Labs  Lab 03/20/17 0234  WBC 20.0*  HGB 8.5*  HCT 27.4*  PLT 274   ------------------------------------------------------------------------------------------------------------------  Chemistries  Recent Labs  Lab 03/19/17 2018 03/20/17 0234  NA 128* 131*  K 2.7* 4.0  CL 91* 100*  CO2 24 20*  GLUCOSE 343* 283*  BUN 44* 38*  CREATININE 1.56* 1.27*  CALCIUM 8.1* 7.4*  AST 33  --   ALT 14  --   ALKPHOS 76  --   BILITOT 0.7  --    ------------------------------------------------------------------------------------------------------------------  Cardiac Enzymes Recent Labs  Lab 03/20/17 1348  TROPONINI 0.11*   ------------------------------------------------------------------------------------------------------------------  RADIOLOGY:  Dg Chest 1 View  Result Date: 03/19/2017 CLINICAL DATA:  Short of breath EXAM: CHEST 1 VIEW COMPARISON:  10/15/2014  FINDINGS: Mild biapical scarring. No focal pulmonary opacity or  pleural effusion. Stable cardiomediastinal silhouette with aortic atherosclerosis. No pneumothorax. Old right second rib fracture. IMPRESSION: No active disease. Electronically Signed   By: Donavan Foil M.D.   On: 03/19/2017 19:59   Dg Ankle Complete Right  Result Date: 03/19/2017 CLINICAL DATA:  Erythema. Evaluate for possible osteomyelitis. Personal history of left foot osteomyelitis in 2016. EXAM: RIGHT ANKLE - COMPLETE 3+ VIEW COMPARISON:  None. FINDINGS: Mild diffuse soft tissue swelling. Osseous demineralization. No evidence of acute fracture or dislocation. Ankle mortise intact with mild joint space narrowing. No evidence of osteomyelitis. IMPRESSION: 1. No acute osseous abnormality.  No evidence of osteomyelitis. 2. Osseous demineralization. 3. Mild osteoarthritis. Electronically Signed   By: Evangeline Dakin M.D.   On: 03/19/2017 21:21   Ct Head Wo Contrast  Result Date: 03/19/2017 CLINICAL DATA:  84 year old with progressively worsening confusion over the past 2 days. Patient fell yesterday. Initial encounter. EXAM: CT HEAD WITHOUT CONTRAST TECHNIQUE: Contiguous axial images were obtained from the base of the skull through the vertex without intravenous contrast. COMPARISON:  12/02/2014, 11/05/2014. FINDINGS: Brain: Ventricular system normal in size and appearance for age. Mild age related cortical atrophy, unchanged. Moderate changes of small vessel disease of the white matter diffusely, unchanged. No mass lesion. No midline shift. No acute hemorrhage or hematoma. No extra-axial fluid collections. No evidence of acute infarction. Vascular: Mild bilateral carotid siphon and vertebral artery atherosclerosis. No hyperdense vessel. Skull: Marked hyperostosis frontalis interna bilaterally. No skull fracture or other focal osseous abnormality involving the skull. Sinuses/Orbits: Visualized paranasal sinuses, bilateral mastoid air cells and bilateral middle ear cavities well-aerated. Visualized orbits and  globes intact. Hyperdense lens involving the right globe, query cataract. Other: None. IMPRESSION: 1. No acute intracranial abnormality. 2. Stable mild age related cortical atrophy and stable moderate chronic microvascular ischemic changes of the white matter. Electronically Signed   By: Evangeline Dakin M.D.   On: 03/19/2017 20:11   Mr Foot Right Wo Contrast  Result Date: 03/20/2017 CLINICAL DATA:  Diabetic with foot ulcerations noted increasing erythema and swelling with drainage. EXAM: MRI OF THE RIGHT FOREFOOT WITHOUT CONTRAST TECHNIQUE: Multiplanar, multisequence MR imaging of the right forefoot was performed. No intravenous contrast was administered. COMPARISON:  None. FINDINGS: Bones/Joint/Cartilage Marrow edema of the great toe is identified involving the distal phalanx and head of the first proximal phalanx, series 7, image 24 suspicious for changes of acute osteomyelitis. Additionally there is intraosseous elevated T2 signal within the distal first metatarsal associated with a cortical defect along the plantar aspect suspicious for a cloaca from chronic osteomyelitis. Callus is noted about a healing distal third metatarsal fracture involving the neck. Small focus of T2 hyperintensities seen that could represent a small posttraumatic cyst given its wall circumscribed margins. Ligaments No focal abnormality Muscles and Tendons No intramuscular mass or fluid collection. Focal abnormality of the extensor and flexor tendons crossing the forefoot. Soft tissues Plantar soft tissue ulceration of the forefoot adjacent to the first MTP articulation. Diffuse soft tissue edema of the included forefoot consistent with cellulitis and/or third spacing of fluid. IMPRESSION: 1. Diffuse soft tissue swelling of the included forefoot without focal drainable fluid collection or abscess identified. 2. Marrow abnormality of the distal phalanx and head of first proximal phalanx of the great toe associated with soft tissue  ulceration along the plantar aspect the forefoot at the level of the first metatarsal head. Findings are concerning for changes of acute osteomyelitis. 3. Possible  cloaca involving the distal first metatarsal with intraosseous elevated T2 signal suspicious for changes of chronic osteomyelitis. 4. Healing third metatarsal neck fracture with small focus of T2 hyperintensity more likely related to posttraumatic degenerative cyst though the possibility of chronic osteomyelitis is not completely excluded as well. Electronically Signed   By: Ashley Royalty M.D.   On: 03/20/2017 22:53   Dg Foot Complete Left  Result Date: 03/19/2017 CLINICAL DATA:  Erythema. Evaluate for possible osteomyelitis. Personal history of left foot osteomyelitis in 2016. EXAM: LEFT FOOT - COMPLETE 3+ VIEW COMPARISON:  Left foot MRI 10/10/2014 left foot x-rays 02/23/2012. FINDINGS: No evidence of acute fracture or dislocation. Osseous demineralization. Hammertoe deformity. Likely remote healed fracture involving the proximal phalanx of the second toe. No evidence of osteomyelitis. Narrowing of the IP joint spaces of the toes and narrowing of joint spaces in the midfoot, unchanged. Small plantar calcaneal spur. Small enthesopathic spur at the insertion of the Achilles tendon on the posterior calcaneus. IMPRESSION: 1. No acute osseous abnormality.  No evidence of osteomyelitis. 2. Osseous demineralization. 3. Mild osteoarthritis. Electronically Signed   By: Evangeline Dakin M.D.   On: 03/19/2017 21:20   Dg Foot Complete Right  Result Date: 03/19/2017 CLINICAL DATA:  Erythema. Evaluate for possible osteomyelitis. Personal history of left foot osteomyelitis in 2016. EXAM: RIGHT FOOT COMPLETE - 3+ VIEW COMPARISON:  None. FINDINGS: Soft tissue ulceration with gas in the tissues of the plantar surface underlying the head of the first metatarsal. No evidence of osteomyelitis. No evidence of acute fracture or dislocation. Osseous demineralization. Remote  fracture involving the head of the third metatarsal with incomplete union. Hammertoe deformity. Joint space narrowing involving the IP joints of the toes and joints of the midfoot. IMPRESSION: 1. Soft tissue ulceration with gas in the tissues of the plantar surface of the foot underlying the head of the first metatarsal. No evidence of osteomyelitis. 2. Remote fracture involving the head of the third metatarsal with incomplete union. 3. Mild osteoarthritis. 4. Osseous demineralization. Electronically Signed   By: Evangeline Dakin M.D.   On: 03/19/2017 21:24    EKG:   Orders placed or performed during the hospital encounter of 03/19/17  . ED EKG  . ED EKG    ASSESSMENT AND PLAN:   84 year old female with past medical history significant for atrial fibrillation, diastolic CHF, diabetes, hypertension, neuropathy and history of stroke was admitted secondary to right foot ulceration and infection.  1. Sepsis- on admission secondary to diabetic foot infection -Required pressors initially, right no blood pressure is stable -MRI showing possible osteomyelitis. Appreciate podiatry consult -Status post first toe amputation - on vancomycin, and zosyn - ID consult on Monday - dressing changes per podiatry  2. DM- uncontrolled sugars, check a1c - on lantus- increase dose, SSI  3.  Paroxysmal A. Fib- on amiodarone - add metoprolol HR elevated from pain and infection - not on anticoagulation  4. Chronic diastolic CHF- well compensated  5. DVT prophylaxis- SQ heparin  PT consult and weight bearing per podiatry   All the records are reviewed and case discussed with Care Management/Social Worker. Management plans discussed with the patient, family and they are in agreement.  CODE STATUS: DNR  TOTAL TIME TAKING CARE OF THIS PATIENT: 38 minutes.   POSSIBLE D/C IN 2-3 DAYS, DEPENDING ON CLINICAL CONDITION.   Gladstone Lighter M.D on 03/21/2017 at 12:14 PM  Between 7am to 6pm - Pager -  [phone]  After 6pm go to [email or web address] - password [email or web address]  [facility]  Office  [phone]  CC: Primary care physician; Rusty Aus, MD

## 2017-03-21 NOTE — Anesthesia Procedure Notes (Signed)
Procedure Name: LMA Insertion Date/Time: 03/21/2017 8:37 AM Performed by: Aline Brochure, CRNA Pre-anesthesia Checklist: Patient identified, Emergency Drugs available, Suction available and Patient being monitored Patient Re-evaluated:Patient Re-evaluated prior to induction Oxygen Delivery Method: Circle system utilized Preoxygenation: Pre-oxygenation with 100% oxygen Induction Type: IV induction Ventilation: Mask ventilation without difficulty LMA: LMA inserted LMA Size: 3.5 Number of attempts: 1 Placement Confirmation: positive ETCO2 and breath sounds checked- equal and bilateral Tube secured with: Tape Dental Injury: Teeth and Oropharynx as per pre-operative assessment

## 2017-03-21 NOTE — Progress Notes (Signed)
Patient B143/96 and HR 134. MD notified. Orders received. Will continue to monitor.

## 2017-03-21 NOTE — Anesthesia Preprocedure Evaluation (Signed)
Anesthesia Evaluation  Patient identified by MRN, date of birth, ID band Patient awake    Reviewed: Allergy & Precautions, H&P , NPO status , Patient's Chart, lab work & pertinent test results, reviewed documented beta blocker date and time   History of Anesthesia Complications Negative for: history of anesthetic complications  Airway Mallampati: I  TM Distance: >3 FB Neck ROM: full    Dental  (+) Caps, Teeth Intact   Pulmonary neg pulmonary ROS, former smoker,           Cardiovascular Exercise Tolerance: Good hypertension, (-) angina+ Peripheral Vascular Disease and +CHF  (-) CAD, (-) Past MI, (-) Cardiac Stents and (-) CABG + dysrhythmias Atrial Fibrillation (-) Valvular Problems/Murmurs     Neuro/Psych neg Seizures CVA, No Residual Symptoms negative psych ROS   GI/Hepatic Neg liver ROS, GERD  Medicated and Controlled,  Endo/Other  diabetesHypothyroidism   Renal/GU CRFRenal disease  negative genitourinary   Musculoskeletal   Abdominal   Peds  Hematology  (+) Blood dyscrasia, anemia ,   Anesthesia Other Findings Past Medical History:   CHF (congestive heart failure) (HCC)                         Diabetes mellitus without complication (HCC)                 Hyperlipidemia                                               Atrial fibrillation (HCC)                                    Hypothyroid                                                  Hypertension                                                 Stroke (HCC)                                                 Neuropathy (HCC)                                             Anemia                                                       Edema  Comment:feet/ankles occas   Osteomyelitis ([place])                                            Comment:left first metatarsal   Hip fracture (HCC)                                            Cardiac arrest ([place])                                         Reproductive/Obstetrics negative OB ROS                            Anesthesia Physical  Anesthesia Plan  ASA: III  Anesthesia Plan: General   Post-op Pain Management:    Induction: Intravenous  PONV Risk Score and Plan: 2 and Ondansetron and Dexamethasone  Airway Management Planned: LMA  Additional Equipment:   Intra-op Plan:   Post-operative Plan: Extubation in OR  Informed Consent: I have reviewed the patients History and Physical, chart, labs and discussed the procedure including the risks, benefits and alternatives for the proposed anesthesia with the patient or authorized representative who has indicated his/her understanding and acceptance.   Dental Advisory Given  Plan Discussed with: Anesthesiologist, CRNA and Surgeon  Anesthesia Plan Comments:        Anesthesia Quick Evaluation

## 2017-03-22 ENCOUNTER — Encounter: Payer: Self-pay | Admitting: Podiatry

## 2017-03-22 LAB — BASIC METABOLIC PANEL
Anion gap: 14 (ref 5–15)
BUN: 30 mg/dL — ABNORMAL HIGH (ref 6–20)
CHLORIDE: 96 mmol/L — AB (ref 101–111)
CO2: 25 mmol/L (ref 22–32)
CREATININE: 1.15 mg/dL — AB (ref 0.44–1.00)
Calcium: 9 mg/dL (ref 8.9–10.3)
GFR calc non Af Amer: 43 mL/min — ABNORMAL LOW (ref >60)
GFR, EST AFRICAN AMERICAN: 50 mL/min — AB (ref >60)
Glucose, Bld: 321 mg/dL — ABNORMAL HIGH (ref 65–99)
POTASSIUM: 3 mmol/L — AB (ref 3.5–5.1)
Sodium: 135 mmol/L (ref 135–145)

## 2017-03-22 LAB — URINE CULTURE

## 2017-03-22 LAB — GLUCOSE, CAPILLARY
GLUCOSE-CAPILLARY: 188 mg/dL — AB (ref 65–99)
GLUCOSE-CAPILLARY: 295 mg/dL — AB (ref 65–99)
Glucose-Capillary: 321 mg/dL — ABNORMAL HIGH (ref 65–99)
Glucose-Capillary: 229 mg/dL — ABNORMAL HIGH (ref 65–99)
Glucose-Capillary: 196 mg/dL — ABNORMAL HIGH (ref 65–99)

## 2017-03-22 LAB — CBC WITH DIFFERENTIAL/PLATELET
BASOS PCT: 0 %
Basophils Absolute: 0.1 10*3/uL (ref 0–0.1)
Eosinophils Absolute: 0 10*3/uL (ref 0–0.7)
Eosinophils Relative: 0 %
HEMATOCRIT: 33.7 % — AB (ref 35.0–47.0)
HEMOGLOBIN: 10.5 g/dL — AB (ref 12.0–16.0)
LYMPHS ABS: 0.7 10*3/uL — AB (ref 1.0–3.6)
Lymphocytes Relative: 3 %
MCH: 23.8 pg — AB (ref 26.0–34.0)
MCHC: 31.1 g/dL — AB (ref 32.0–36.0)
MCV: 76.5 fL — ABNORMAL LOW (ref 80.0–100.0)
MONOS PCT: 6 %
Monocytes Absolute: 1.3 10*3/uL — ABNORMAL HIGH (ref 0.2–0.9)
NEUTROS ABS: 21.2 10*3/uL — AB (ref 1.4–6.5)
NEUTROS PCT: 91 %
Platelets: 377 10*3/uL (ref 150–440)
RBC: 4.41 MIL/uL (ref 3.80–5.20)
RDW: 23.3 % — ABNORMAL HIGH (ref 11.5–14.5)
WBC: 23.3 10*3/uL — ABNORMAL HIGH (ref 3.6–11.0)

## 2017-03-22 LAB — PROCALCITONIN: Procalcitonin: 0.59 ng/mL

## 2017-03-22 MED ORDER — PREDNISONE 50 MG PO TABS
50.0000 mg | ORAL_TABLET | Freq: Every day | ORAL | Status: DC
  Administered 2017-03-23: 50 mg via ORAL
  Filled 2017-03-22: qty 1

## 2017-03-22 MED ORDER — SENNOSIDES-DOCUSATE SODIUM 8.6-50 MG PO TABS
1.0000 | ORAL_TABLET | Freq: Two times a day (BID) | ORAL | Status: DC
  Administered 2017-03-22: 1 via ORAL
  Filled 2017-03-22 (×2): qty 1

## 2017-03-22 MED ORDER — VANCOMYCIN HCL IN DEXTROSE 1-5 GM/200ML-% IV SOLN
1000.0000 mg | INTRAVENOUS | Status: DC
  Administered 2017-03-23: 1000 mg via INTRAVENOUS
  Filled 2017-03-22: qty 200

## 2017-03-22 MED ORDER — METOPROLOL TARTRATE 50 MG PO TABS
50.0000 mg | ORAL_TABLET | Freq: Two times a day (BID) | ORAL | Status: DC
  Administered 2017-03-22: 50 mg via ORAL
  Filled 2017-03-22 (×2): qty 1

## 2017-03-22 MED ORDER — SODIUM CHLORIDE 0.9 % IV SOLN
INTRAVENOUS | Status: DC
  Administered 2017-03-22: 14:00:00 via INTRAVENOUS

## 2017-03-22 MED ORDER — POTASSIUM CHLORIDE CRYS ER 20 MEQ PO TBCR
40.0000 meq | EXTENDED_RELEASE_TABLET | ORAL | Status: AC
  Administered 2017-03-22 (×2): 40 meq via ORAL
  Filled 2017-03-22 (×2): qty 2

## 2017-03-22 NOTE — NC FL2 (Signed)
[place] LEVEL OF CARE SCREENING TOOL     IDENTIFICATION  Patient Name: Sharon [name] Birthdate: 08-30-1933 Sex: female Admission Date (Current Location): 03/19/2017  [place] and [place] Number:  Engineering geologist and Address:  [facility], [address] [place], [place] [address]      Provider Number: [id number]  Attending Physician Name and Address:  Gladstone Lighter, MD  Relative Name and Phone Number:  Moncia Annas [facility]) [phone] or Kenyada Dosch (Son) [phone]    Current Level of Care: Hospital Recommended Level of Care: [place] Prior Approval Number:    Date Approved/Denied:   PASRR Number: [id number]  Discharge Plan: SNF    Current Diagnoses: Patient Active Problem List   Diagnosis Date Noted  . Septic shock ([place]) 03/20/2017  . CKD (chronic kidney disease), stage III ([place]) 03/19/2017  . Goals of care, counseling/discussion 03/03/2017  . PAD (peripheral artery disease) ([place]) 02/03/2017  . Abdominal aortic stenosis 02/03/2017  . Bilateral lower extremity edema 02/03/2017  . Anemia 01/29/2017  . B12 deficiency 01/29/2017  . Pancreatic mass 01/29/2017  . Hyponatremia 07/07/2015  . Foot ulcer ([place]) 02/07/2015  . ARF (acute renal failure) ([place]) 11/05/2014  . Diabetic foot infection ([place]) 10/10/2014  . Chronic diastolic heart failure ([place]) 05/31/2014  . HTN (hypertension) 05/31/2014  . DM (diabetes mellitus), type 2, uncontrolled ([place]) 05/31/2014    Orientation RESPIRATION BLADDER Height & Weight     Self, Time, Situation, Place  O2(Acute o2 2 L) Continent Weight: 142 lb 13.7 oz (64.8 kg) Height:  5\' 5"  (165.1 cm)  BEHAVIORAL SYMPTOMS/MOOD NEUROLOGICAL BOWEL NUTRITION STATUS      Continent Diet(Carb modified)  AMBULATORY STATUS COMMUNICATION OF NEEDS Skin   Extensive Assist Verbally Surgical wounds                       Personal Care Assistance Level of Assistance   Bathing, Feeding, Dressing Bathing Assistance: Limited assistance Feeding assistance: Independent Dressing Assistance: Limited assistance     Functional Limitations Info  Sight, Hearing, Speech Sight Info: Adequate Hearing Info: Adequate Speech Info: Adequate    SPECIAL CARE FACTORS FREQUENCY  PT (By licensed PT)     PT Frequency: Up to 5X per day/week              Contractures Contractures Info: Not present    Additional Factors Info  Code Status, Allergies, Insulin Sliding Scale Code Status Info: DNR Allergies Info: Carbamazepine, Codeine, Cymbalta Duloxetine Hcl, Duloxetine, Lyrica Pregabalin   Insulin Sliding Scale Info: Novolog: 0-5 units QHS and 0-15 units TID with meals       Current Medications (03/22/2017):  This is the current hospital active medication list Current Facility-Administered Medications  Medication Dose Route Frequency Provider Last Rate Last Dose  . 0.9 %  sodium chloride infusion   Intravenous Continuous Gladstone Lighter, MD 60 mL/hr at 03/22/17 1334    . acetaminophen (TYLENOL) tablet 650 mg  650 mg Oral Q6H PRN Lance Coon, MD   650 mg at 03/21/17 1415   Or  . acetaminophen (TYLENOL) suppository 650 mg  650 mg Rectal Q6H PRN Lance Coon, MD      . amiodarone (PACERONE) tablet 200 mg  200 mg Oral Daily Lance Coon, MD   200 mg at 03/22/17 1103  . aspirin chewable tablet 81 mg  81 mg Oral Daily Lance Coon, MD   81 mg at 03/22/17 1103  . heparin injection 5,000  Units  5,000 Units Subcutaneous Driscilla Moats, MD   5,000 Units at 03/22/17 1331  . insulin aspart (novoLOG) injection 0-15 Units  0-15 Units Subcutaneous TID WC Bettey Costa, MD   5 Units at 03/22/17 1332  . insulin aspart (novoLOG) injection 0-5 Units  0-5 Units Subcutaneous QHS Lance Coon, MD   3 Units at 03/21/17 2158  . insulin glargine (LANTUS) injection 10 Units  10 Units Subcutaneous Daily Gladstone Lighter, MD   10 Units at 03/22/17 1102  . ipratropium-albuterol  (DUONEB) 0.5-2.5 (3) MG/3ML nebulizer solution 3 mL  3 mL Nebulization Q4H PRN Flora Lipps, MD   3 mL at 03/21/17 2104  . levothyroxine (SYNTHROID, LEVOTHROID) tablet 112 mcg  112 mcg Oral QAC breakfast Lance Coon, MD   112 mcg at 03/22/17 0601  . metoprolol tartrate (LOPRESSOR) tablet 50 mg  50 mg Oral BID Gladstone Lighter, MD      . multivitamin-lutein (OCUVITE-LUTEIN) capsule 1 capsule  1 capsule Oral Daily Conforti, John, DO   1 capsule at 03/22/17 1103  . ondansetron (ZOFRAN) injection 4 mg  4 mg Intravenous Once Darel Hong, MD      . ondansetron [facility]) tablet 4 mg  4 mg Oral Q6H PRN Lance Coon, MD       Or  . ondansetron [facility]) injection 4 mg  4 mg Intravenous Q6H PRN Lance Coon, MD   4 mg at 03/21/17 2057  . oxyCODONE (Oxy IR/ROXICODONE) immediate release tablet 5 mg  5 mg Oral Q4H PRN Lance Coon, MD   5 mg at 03/21/17 1645  . piperacillin-tazobactam (ZOSYN) IVPB 3.375 g  3.375 g Intravenous Q8H Mody, Sital, MD 12.5 mL/hr at 03/22/17 1620 3.375 g at 03/22/17 1620  . polyethylene glycol (MIRALAX / GLYCOLAX) packet 17 g  17 g Oral Daily PRN Conforti, John, DO   17 g at 03/20/17 1446  . [START ON 03/23/2017] predniSONE (DELTASONE) tablet 50 mg  50 mg Oral Q breakfast Gladstone Lighter, MD      . protein supplement (PREMIER PROTEIN) liquid  11 oz Oral BID BM Conforti, John, DO   11 oz at 03/22/17 1118  . senna-docusate (Senokot-S) tablet 1 tablet  1 tablet Oral BID Gladstone Lighter, MD      . simethicone [facility]) chewable tablet 80 mg  80 mg Oral Q6H PRN Gladstone Lighter, MD   80 mg at 03/21/17 2140  . simvastatin (ZOCOR) tablet 40 mg  40 mg Oral q1800 Lance Coon, MD   40 mg at 03/21/17 1721  . [START ON 03/23/2017] vancomycin (VANCOCIN) IVPB 1000 mg/200 mL premix  1,000 mg Intravenous Q24H Gladstone Lighter, MD       Facility-Administered Medications Ordered in Other Encounters  Medication Dose Route Frequency Provider Last Rate Last Dose  . sodium chloride  flush (NS) 0.9 % injection 10 mL  10 mL Intracatheter PRN Porshe Fleagle Kitchens, NP         Discharge Medications: Please see discharge summary for a list of discharge medications.  Relevant Imaging Results:  Relevant Lab Results:   Additional Information [phone]  Zettie Pho, LCSW

## 2017-03-22 NOTE — Evaluation (Signed)
Physical Therapy Evaluation Patient Details Name: Sharon [name] MRN: [id number] DOB: 11-07-1933 Today's Date: 03/22/2017   History of Present Illness  84 y/o female here with confusion and lethargy, had b/l LE diabetic ulcerations and ultimately had R 1st ray   Clinical Impression  Pt did well with PT exam and though she was not able to fully keep weight off R during transfer to recliner she did appear to use UEs well on walker and keep only minimal weight on heel.  Pt typically is relatively independent but would not be safe at home with current functional status and will need STR on d/c.     Follow Up Recommendations SNF    Equipment Recommendations       Recommendations for Other Services       Precautions / Restrictions Precautions Precautions: Fall Restrictions Weight Bearing Restrictions: Yes RLE Weight Bearing: Non weight bearing(per surgeon heel WBing okay for transfers)      Mobility  Bed Mobility Overal bed mobility: Modified Independent             General bed mobility comments: Pt able to get herself to sitting EOB w/o assist  Transfers Overall transfer level: Needs assistance Equipment used: Rolling walker (2 wheeled) Transfers: Sit to/from [facility] Sit to Stand: Min guard Stand pivot transfers: Min assist       General transfer comment: Pt was able to stand and shift to recliner w/o heavy assist, she did however clearly use WBing through R heel despite cuing to try to avoid.  Only briefly able to keep R foot of floor for NWBing.  Ambulation/Gait             General Gait Details: Pt unable to do any true ambulation, heel-toe shifting only during transfer to reclienr  Stairs            Wheelchair Mobility    Modified Rankin (Stroke Patients Only)       Balance Overall balance assessment: Needs assistance Sitting-balance support: No upper extremity supported Sitting balance-Leahy Scale: Good     Standing  balance support: Bilateral upper extremity supported Standing balance-Leahy Scale: Fair Standing balance comment: highly reliant on walker, unable to consistently keep weight off R fully but no LOBs with walker                             Pertinent Vitals/Pain Pain Assessment: 0-10 Pain Score: 4  Pain Location: R foot    Home Living Family/patient expects to be discharged to:: Skilled nursing facility Living Arrangements: Alone Available Help at Discharge: Personal care attendant(2 x 2 hr QD)   Home Access: Level entry       Home Equipment: Walker - 4 wheels      Prior Function Level of Independence: Independent with assistive device(s)         Comments: Pt able to get out of the house nearly daily, generally active     Hand Dominance        Extremity/Trunk Assessment   Upper Extremity Assessment Upper Extremity Assessment: Overall WFL for tasks assessed;Generalized weakness(age appropriate)    Lower Extremity Assessment Lower Extremity Assessment: Overall WFL for tasks assessed;Generalized weakness(age appropriate)       Communication   Communication: No difficulties  Cognition Arousal/Alertness: Awake/alert Behavior During Therapy: WFL for tasks assessed/performed Overall Cognitive Status: Within Functional Limits for tasks assessed  General Comments      Exercises     Assessment/Plan    PT Assessment Patient needs continued PT services  PT Problem List Decreased strength;Decreased range of motion;Decreased activity tolerance;Decreased balance;Decreased coordination;Decreased mobility;Decreased knowledge of use of DME;Decreased safety awareness;Decreased knowledge of precautions       PT Treatment Interventions DME instruction;Gait training;Functional mobility training;Therapeutic activities;Therapeutic exercise;Balance training;Cognitive remediation;Neuromuscular  re-education;Patient/family education    PT Goals (Current goals can be found in the Care Plan section)  Acute Rehab PT Goals Patient Stated Goal: get back to walking  PT Goal Formulation: With patient Time For Goal Achievement: 04/05/17 Potential to Achieve Goals: Good    Frequency 7X/week   Barriers to discharge        Co-evaluation               AM-PAC PT "6 Clicks" Daily Activity  Outcome Measure Difficulty turning over in bed (including adjusting bedclothes, sheets and blankets)?: None Difficulty moving from lying on back to sitting on the side of the bed? : A Little Difficulty sitting down on and standing up from a chair with arms (e.g., wheelchair, bedside commode, etc,.)?: Unable Help needed moving to and from a bed to chair (including a wheelchair)?: A Little Help needed walking in hospital room?: Total Help needed climbing 3-5 steps with a railing? : Total 6 Click Score: 13    End of Session Equipment Utilized During Treatment: Gait belt Activity Tolerance: Patient tolerated treatment well Patient left: with chair alarm set;with call bell/phone within reach Nurse Communication: Mobility status;Weight bearing status PT Visit Diagnosis: Muscle weakness (generalized) (M62.81);Difficulty in walking, not elsewhere classified (R26.2)    Time: 4037-0964 PT Time Calculation (min) (ACUTE ONLY): 27 min   Charges:   PT Evaluation $PT Eval Low Complexity: 1 Low     PT G Codes:        Kreg Shropshire, DPT 03/22/2017, 5:27 PM

## 2017-03-22 NOTE — Progress Notes (Addendum)
ANTIBIOTIC CONSULT NOTE -   Pharmacy Consult for vancomycin Indication: sepsis/ DM foot infection  Allergies  Allergen Reactions  . Carbamazepine Other (See Comments)    Reaction: unknown  . Codeine Other (See Comments)    Other reaction(s): Other (See Comments) Patient states she did not feel herself when she took it. unknown  . Cymbalta [Duloxetine Hcl] Other (See Comments)    Confusion, disorientation  . Duloxetine     Other reaction(s): Other (See Comments) Confusion, disorientation  . Lyrica [Pregabalin] Other (See Comments)    Patient and son states this makes the patient confused.    Patient Measurements: Height: 5\' 5"  (165.1 cm) Weight: 142 lb 13.7 oz (64.8 kg) IBW/kg (Calculated) : 57 Adjusted Body Weight:   Vital Signs: Temp: 98.1 F (36.7 C) (03/10 0644) Temp Source: Oral (03/10 0644) BP: 151/73 (03/10 0644) Pulse Rate: 90 (03/10 0644) Intake/Output from previous day: 03/09 0701 - 03/10 0700 In: 150 [I.V.:150] Out: 1600 [Urine:1600] Intake/Output from this shift: No intake/output data recorded.  Labs: Recent Labs    03/19/17 2018 03/20/17 0234 03/22/17 0707  WBC 17.4* 20.0* 23.3*  HGB 9.7* 8.5* 10.5*  PLT 277 274 377  CREATININE 1.56* 1.27* 1.15*   Estimated Creatinine Clearance: 34.5 mL/min (A) (by C-G formula based on SCr of 1.15 mg/dL (H)). No results for input(s): VANCOTROUGH, VANCOPEAK, VANCORANDOM, GENTTROUGH, GENTPEAK, GENTRANDOM, TOBRATROUGH, TOBRAPEAK, TOBRARND, AMIKACINPEAK, AMIKACINTROU, AMIKACIN in the last 72 hours.   Microbiology: Recent Results (from the past 720 hour(s))  Blood Culture (routine x 2)     Status: None (Preliminary result)   Collection Time: 03/19/17  8:18 PM  Result Value Ref Range Status   Specimen Description BLOOD RIGHT ARM  Final   Special Requests   Final    BOTTLES DRAWN AEROBIC AND ANAEROBIC Blood Culture adequate volume   Culture   Final    NO GROWTH 3 DAYS Performed at [facility], [address] [place], [place] [address]    Report Status PENDING  Incomplete  Urine culture     Status: Abnormal   Collection Time: 03/19/17  8:20 PM  Result Value Ref Range Status   Specimen Description   Final    URINE, RANDOM Performed at [facility], [address], [place], [place] [address]    Special Requests   Final    NONE Performed at [facility], [address] [place], [place] [address]    Culture >=100,000 COLONIES/mL ENTEROCOCCUS FAECALIS (A)  Final   Report Status 03/22/2017 FINAL  Final   Organism ID, Bacteria ENTEROCOCCUS FAECALIS (A)  Final      Susceptibility   Enterococcus faecalis - MIC*    AMPICILLIN <=2 SENSITIVE Sensitive     LEVOFLOXACIN >=8 RESISTANT Resistant     NITROFURANTOIN <=16 SENSITIVE Sensitive     VANCOMYCIN 1 SENSITIVE Sensitive     * >=100,000 COLONIES/mL ENTEROCOCCUS FAECALIS  Blood Culture (routine x 2)     Status: None (Preliminary result)   Collection Time: 03/19/17  8:30 PM  Result Value Ref Range Status   Specimen Description BLOOD RIGHT ARM  Final   Special Requests   Final    BOTTLES DRAWN AEROBIC AND ANAEROBIC Blood Culture adequate volume   Culture   Final    NO GROWTH 3 DAYS Performed at [facility], [address], [place], [place] [address]    Report Status PENDING  Incomplete  MRSA PCR Screening     Status: None  Collection Time: 03/20/17  9:43 AM  Result Value Ref Range Status   MRSA by PCR NEGATIVE NEGATIVE Final    Comment:        The GeneXpert MRSA Assay (FDA approved for NASAL specimens only), is one component of a comprehensive MRSA colonization surveillance program. It is not intended to diagnose MRSA infection nor to guide or monitor treatment for MRSA infections. Performed at [facility], [address] [place], [place] [address]   Aerobic/Anaerobic Culture (surgical/deep wound)     Status: None (Preliminary result)   Collection Time: 03/21/17  8:57 AM   Result Value Ref Range Status   Specimen Description ABSCESS RIGHT FOOT  Final   Special Requests PATIENT ON FOLLOWING VANC AND ZOSYN  Final   Gram Stain   Final    NO WBC SEEN FEW GRAM POSITIVE COCCI IN PAIRS RARE GRAM NEGATIVE RODS RARE GRAM POSITIVE RODS    Culture   Final    CULTURE REINCUBATED FOR BETTER GROWTH Performed at [facility], [place] [address], [place], [place] [address]    Report Status PENDING  Incomplete    Medical History: Past Medical History:  Diagnosis Date  . Anemia   . Atrial fibrillation ([place])   . Cardiac arrest ([place])   . Cataract   . CHF (congestive heart failure) ([place])   . Diabetes mellitus without complication ([place])   . Edema    feet/ankles occas  . Hip fracture ([place])   . Hyperlipidemia   . Hypertension   . Hypothyroid   . Neuropathy   . Osteomyelitis ([place])    left first metatarsal  . Stroke ([place])     Medications:  Medications Prior to Admission  Medication Sig Dispense Refill Last Dose  . acetaminophen (TYLENOL) 500 MG tablet Take 500 mg by mouth every 4 (four) hours as needed for mild pain or moderate pain.   prn at prn  . aspirin 81 MG tablet Take 81 mg by mouth daily.   03/18/2017 at Unknown time  . cholecalciferol (VITAMIN D) 1000 units tablet Take 1,000 Units by mouth daily.   03/18/2017 at Unknown time  . furosemide (LASIX) 40 MG tablet Take 40 mg by mouth daily.    03/18/2017 at Unknown time  . gabapentin (NEURONTIN) 600 MG tablet 600 mg 4 (four) times daily.    03/18/2017 at Unknown time  . glimepiride (AMARYL) 2 MG tablet Take 2 mg by mouth daily with breakfast.   03/18/2017 at Unknown time  . levothyroxine (SYNTHROID, LEVOTHROID) 112 MCG tablet Take one tablet by mouth once daily 30 minutes before breakfast once daily for hypothyroid   03/18/2017 at Unknown time  . metolazone (ZAROXOLYN) 2.5 MG tablet Take 2.5 mg by mouth every other day.   03/18/2017 at Unknown time  . Multiple Vitamins-Minerals (PRESERVISION AREDS 2+MULTI VIT PO) Take  1 capsule by mouth 2 (two) times daily.    03/18/2017 at Unknown time  . simvastatin (ZOCOR) 40 MG tablet Take one tablet by mouth once daily at bedtime for cholesterol   03/18/2017 at Unknown time   Assessment: Scr 1.15  CrCl = 34.5 ml/min   Goal of Therapy:  resolution of infection   Plan:  Expected duration 7 days with resolution of temperature and/or normalization of WBC   Vancomycin  DW 64kg  Vd 45l kei 0.026 hr-1  T1/2 27 hours Vancomycin 1 gram q 36 hours ordered with stacked dosing. Level before 4th dose. Goal trough 15-20.  3/10: Postop  day 1 after 1st ray amputation. Osteo with gas producing infection. Scr improved. Will adjust Vancomycin to 1 gram IV q24h. Ke 0.033 t1/2 21  Vd 45.  Patient also on Zosyn. Day 4 of Vancomycin therapy- will adjust trough to prior to 5th total dose but prior to 3rd dose of new regimen on 3/12.(this will be on 6th day of total therapy so not at steady state with new regimen). Note: Ucx= enterococcus faecalis- sensitive to [place] PharmD Clinical Pharmacist 03/22/2017

## 2017-03-22 NOTE — Progress Notes (Signed)
Daily Progress Note   Subjective  - 1 Day Post-Op  F/u right 1st ray amputation.    Objective Vitals:   03/22/17 0001 03/22/17 0335 03/22/17 0448 03/22/17 0644  BP: 138/87  (!) 147/98 (!) 151/73  Pulse: (!) 131 100 90 90  Resp: 17 18 16 19   Temp: 98.2 F (36.8 C)   98.1 F (36.7 C)  TempSrc: Oral   Oral  SpO2: 99% 100% 90% (!) 82%  Weight:      Height:        Physical Exam: Wound is stable.  Bloody drainage.  No pus.  Packing changed.  Laboratory CBC    Component Value Date/Time   WBC 23.3 (H) 03/22/2017 0707   HGB 10.5 (L) 03/22/2017 0707   HGB 10.1 (L) 12/28/2013 0500   HCT 33.7 (L) 03/22/2017 0707   HCT 31.6 (L) 12/28/2013 0500   PLT 377 03/22/2017 0707   PLT 234 12/28/2013 0500    BMET    Component Value Date/Time   NA 135 03/22/2017 0707   NA 136 (A) 10/19/2014   NA 132 (L) 04/13/2014 1416   K 3.0 (L) 03/22/2017 0707   K 3.8 04/13/2014 1416   CL 96 (L) 03/22/2017 0707   CL 100 (L) 04/13/2014 1416   CO2 25 03/22/2017 0707   CO2 24 04/13/2014 1416   GLUCOSE 321 (H) 03/22/2017 0707   GLUCOSE 123 (H) 04/13/2014 1416   BUN 30 (H) 03/22/2017 0707   BUN 17 10/21/2014   BUN 15 04/13/2014 1416   CREATININE 1.15 (H) 03/22/2017 0707   CREATININE 1.04 (H) 04/13/2014 1416   CALCIUM 9.0 03/22/2017 0707   CALCIUM 8.7 (L) 04/13/2014 1416   GFRNONAA 43 (L) 03/22/2017 0707   GFRNONAA 51 (L) 04/13/2014 1416   GFRAA 50 (L) 03/22/2017 0707   GFRAA 60 (L) 04/13/2014 1416    Assessment/Planning: Osteo with gas producing infection s/p right 1st ray POD 1   Dressing changed.  Wound looks good.  Good perfusion of skin flaps.  No pus.  WBC is still high.  Wound cx pending.  Will need IV abx and dressing changes upon d/c  Will f/u tomorrow.      Samara Deist A  03/22/2017, 12:44 PM

## 2017-03-22 NOTE — Clinical Social Work Note (Signed)
Clinical Social Work Assessment  Patient Details  Name: Sharon [name] MRN: [id number] Date of Birth: 26-May-1933  Date of referral:  03/22/17               Reason for consult:  Facility Placement                Permission sought to share information with:  Chartered certified accountant granted to share information::  Yes, Verbal Permission Granted  Name::        Agency::  [facility] area SNFs  Relationship::     Contact Information:     Housing/Transportation Living arrangements for the past 2 months:  [place] of Information:  Patient, Medical Team Patient Interpreter Needed:  None Criminal Activity/Legal Involvement Pertinent to Current Situation/Hospitalization:  No - Comment as needed Significant Relationships:  Adult Children, [place], [employer] Lives with:  Self Do you feel safe going back to the place where you live?  Yes Need for family participation in patient care:  No (Coment)  Care giving concerns: PT recommendation for STR   Social Worker assessment / plan:  The CSW met with the patient at bedside to discuss discharge planning. The patient gave verbal permission to conduct a referral for SNF and named [facility] as her preference. The patient stated that she lives alone but has an aide 3-4 hours each day. The patient has a good relationship with her sons.  The CSW will provide bed offers as they are available and begin the authorization through [facility] when appropriate. The patient will most likely discharge tomorrow or Tuesday. The CSW is following.  Employment status:  Retired Advertising copywriter PT Recommendations:  [place] / Referral to community resources:  [place]  Patient/Family's Response to care:  The patient was gracious and thanked the CSW for care.  Patient/Family's Understanding of and Emotional Response to Diagnosis, Current  Treatment, and Prognosis: The patient understands that her safety would be at risk if she were to return home at this time. The patient is in agreement with SNF.  Emotional Assessment Appearance:  Appears stated age Attitude/Demeanor/Rapport:  Engaged Affect (typically observed):  Accepting, Appropriate, Pleasant Orientation:  Oriented to Self, Oriented to Place, Oriented to  Time, Oriented to Situation Alcohol / Substance use:  Never Used Psych involvement (Current and /or in the community):  No (Comment)  Discharge Needs  Concerns to be addressed:  Care Coordination, Discharge Planning Concerns Readmission within the last 30 days:  No Current discharge risk:  None Barriers to Discharge:  Continued Medical Work up   [employer], LCSW 03/22/2017, 4:40 PM

## 2017-03-22 NOTE — Progress Notes (Addendum)
[place] [facility] [place] NAME: Sharon [name]    MR#:  [id number]  DATE OF BIRTH:  [birth year]  SUBJECTIVE:  CHIEF COMPLAINT:   Chief Complaint  Patient presents with  . Drug Overdose   - postop day 1 after right first toe partial ray amputation. -Patient appears very confused this morning, also complains of constipation  REVIEW OF SYSTEMS:  Review of Systems  Constitutional: Positive for malaise/fatigue. Negative for chills and fever.  HENT: Negative for congestion, ear discharge, hearing loss and nosebleeds.   Eyes: Negative for blurred vision and double vision.  Respiratory: Negative for cough, shortness of breath and wheezing.   Cardiovascular: Negative for chest pain, palpitations and leg swelling.  Gastrointestinal: Positive for constipation. Negative for abdominal pain, diarrhea, nausea and vomiting.  Genitourinary: Negative for dysuria.  Musculoskeletal: Positive for joint pain and myalgias.  Neurological: Negative for dizziness, speech change, focal weakness, seizures and headaches.  Psychiatric/Behavioral: Negative for depression.    DRUG ALLERGIES:   Allergies  Allergen Reactions  . Carbamazepine Other (See Comments)    Reaction: unknown  . Codeine Other (See Comments)    Other reaction(s): Other (See Comments) Patient states she did not feel herself when she took it. unknown  . Cymbalta [Duloxetine Hcl] Other (See Comments)    Confusion, disorientation  . Duloxetine     Other reaction(s): Other (See Comments) Confusion, disorientation  . Lyrica [Pregabalin] Other (See Comments)    Patient and son states this makes the patient confused.    VITALS:  Blood pressure (!) 151/73, pulse 90, temperature 98.1 F (36.7 C), temperature source Oral, resp. rate 19, height 5\' 5"  (1.651 m), weight 64.8 kg (142 lb 13.7 oz), SpO2 (!) 82 %.  PHYSICAL EXAMINATION:  Physical Exam  GENERAL:  84 y.o.-year-old elderly patient lying  in the bed with no acute distress.  EYES: Pupils equal, round, reactive to light and accommodation. No scleral icterus. Extraocular muscles intact.  HEENT: Head atraumatic, normocephalic. Oropharynx and nasopharynx clear.  NECK:  Supple, no jugular venous distention. No thyroid enlargement, no tenderness.  LUNGS: Normal breath sounds bilaterally, no wheezing, rales,rhonchi or crepitation. No use of accessory muscles of respiration. Decreased at the bases CARDIOVASCULAR: S1, S2 normal. No rubs, or gallops. 2/6 systolic murmur present ABDOMEN: Soft, nontender, nondistended. Bowel sounds present. No organomegaly or mass.  EXTREMITIES: right foot in dressing. No pedal edema, cyanosis, or clubbing.  NEUROLOGIC: Cranial nerves II through XII are intact. Muscle strength 5/5 in all extremities. Sensation intact. Gait not checked.  PSYCHIATRIC: The patient is alert and oriented x 1-2, but hearing voices occasionally  SKIN: No obvious rash, lesion, or ulcer.    LABORATORY PANEL:   CBC Recent Labs  Lab 03/22/17 0707  WBC 23.3*  HGB 10.5*  HCT 33.7*  PLT 377   ------------------------------------------------------------------------------------------------------------------  Chemistries  Recent Labs  Lab 03/19/17 2018  03/22/17 0707  NA 128*   < > 135  K 2.7*   < > 3.0*  CL 91*   < > 96*  CO2 24   < > 25  GLUCOSE 343*   < > 321*  BUN 44*   < > 30*  CREATININE 1.56*   < > 1.15*  CALCIUM 8.1*   < > 9.0  AST 33  --   --   ALT 14  --   --   ALKPHOS 76  --   --   BILITOT 0.7  --   --    < > =  values in this interval not displayed.   ------------------------------------------------------------------------------------------------------------------  Cardiac Enzymes Recent Labs  Lab 03/20/17 1348  TROPONINI 0.11*   ------------------------------------------------------------------------------------------------------------------  RADIOLOGY:  Mr Foot Right Wo Contrast  Result Date:  03/20/2017 CLINICAL DATA:  Diabetic with foot ulcerations noted increasing erythema and swelling with drainage. EXAM: MRI OF THE RIGHT FOREFOOT WITHOUT CONTRAST TECHNIQUE: Multiplanar, multisequence MR imaging of the right forefoot was performed. No intravenous contrast was administered. COMPARISON:  None. FINDINGS: Bones/Joint/Cartilage Marrow edema of the great toe is identified involving the distal phalanx and head of the first proximal phalanx, series 7, image 24 suspicious for changes of acute osteomyelitis. Additionally there is intraosseous elevated T2 signal within the distal first metatarsal associated with a cortical defect along the plantar aspect suspicious for a cloaca from chronic osteomyelitis. Callus is noted about a healing distal third metatarsal fracture involving the neck. Small focus of T2 hyperintensities seen that could represent a small posttraumatic cyst given its wall circumscribed margins. Ligaments No focal abnormality Muscles and Tendons No intramuscular mass or fluid collection. Focal abnormality of the extensor and flexor tendons crossing the forefoot. Soft tissues Plantar soft tissue ulceration of the forefoot adjacent to the first MTP articulation. Diffuse soft tissue edema of the included forefoot consistent with cellulitis and/or third spacing of fluid. IMPRESSION: 1. Diffuse soft tissue swelling of the included forefoot without focal drainable fluid collection or abscess identified. 2. Marrow abnormality of the distal phalanx and head of first proximal phalanx of the great toe associated with soft tissue ulceration along the plantar aspect the forefoot at the level of the first metatarsal head. Findings are concerning for changes of acute osteomyelitis. 3. Possible cloaca involving the distal first metatarsal with intraosseous elevated T2 signal suspicious for changes of chronic osteomyelitis. 4. Healing third metatarsal neck fracture with small focus of T2 hyperintensity more  likely related to posttraumatic degenerative cyst though the possibility of chronic osteomyelitis is not completely excluded as well. Electronically Signed   By: Ashley Royalty M.D.   On: 03/20/2017 22:53    EKG:   Orders placed or performed during the hospital encounter of 03/19/17  . ED EKG  . ED EKG    ASSESSMENT AND PLAN:   84 year old female with past medical history significant for atrial fibrillation, diastolic CHF, diabetes, hypertension, neuropathy and history of stroke was admitted secondary to right foot ulceration and infection.  1. Sepsis- on admission secondary to diabetic foot infection -Required pressors initially, right now blood pressure is stable -MRI showing possible osteomyelitis. Appreciate podiatry consult -Status post first toe amputation - on vancomycin, and zosyn - ID consult on Monday - dressing changes per podiatry- non weight bearing of right leg per podiatry  2. DM- uncontrolled sugars,  a1c 7.1 - on lantus- SSI  3.  Paroxysmal A. Fib- on amiodarone - added metoprolol HR elevated from pain and infection - not on anticoagulation  4. Chronic diastolic CHF- well compensated Monitor while on IV fluids - CXR today  5. Acute delirium-likely being in the hospital and also on IV steroids. -Decrease IV steroids dose. Monitor carefully  6. DVT prophylaxis- SQ heparin  7. Constipation- meds added  8. Hypokalemia- replacements ordered  PT consult and weight bearing per podiatry   All the records are reviewed and case discussed with Care Management/Social Worker. Management plans discussed with the patient, family and they are in agreement.  CODE STATUS: DNR  TOTAL TIME TAKING CARE OF THIS PATIENT: 36 minutes.   POSSIBLE D/C IN  1-2 DAYS, DEPENDING ON CLINICAL CONDITION.   Gladstone Lighter M.D on 03/22/2017 at 1:09 PM  Between 7am to 6pm - Pager - [phone]  After 6pm go to [email or web address] - password [email or web address] [facility]    Office  [phone]  CC: Primary care physician; Rusty Aus, MD

## 2017-03-23 ENCOUNTER — Encounter
Admission: RE | Admit: 2017-03-23 | Discharge: 2017-03-23 | Disposition: A | Discharge status: Home / Self Care | Payer: PPO | Source: Ambulatory Visit | Attending: Internal Medicine | Admitting: Internal Medicine

## 2017-03-23 ENCOUNTER — Inpatient Hospital Stay: Payer: Self-pay

## 2017-03-23 ENCOUNTER — Inpatient Hospital Stay: Payer: PPO

## 2017-03-23 DIAGNOSIS — R410 Disorientation, unspecified: Secondary | ICD-10-CM | POA: Diagnosis not present

## 2017-03-23 DIAGNOSIS — R262 Difficulty in walking, not elsewhere classified: Secondary | ICD-10-CM | POA: Diagnosis not present

## 2017-03-23 DIAGNOSIS — L089 Local infection of the skin and subcutaneous tissue, unspecified: Secondary | ICD-10-CM | POA: Diagnosis not present

## 2017-03-23 DIAGNOSIS — E11621 Type 2 diabetes mellitus with foot ulcer: Secondary | ICD-10-CM | POA: Diagnosis not present

## 2017-03-23 DIAGNOSIS — Z7401 Bed confinement status: Secondary | ICD-10-CM | POA: Diagnosis not present

## 2017-03-23 DIAGNOSIS — I70212 Atherosclerosis of native arteries of extremities with intermittent claudication, left leg: Secondary | ICD-10-CM | POA: Diagnosis not present

## 2017-03-23 DIAGNOSIS — I4891 Unspecified atrial fibrillation: Secondary | ICD-10-CM | POA: Diagnosis not present

## 2017-03-23 DIAGNOSIS — H3530 Unspecified macular degeneration: Secondary | ICD-10-CM | POA: Diagnosis not present

## 2017-03-23 DIAGNOSIS — L97513 Non-pressure chronic ulcer of other part of right foot with necrosis of muscle: Secondary | ICD-10-CM | POA: Diagnosis not present

## 2017-03-23 DIAGNOSIS — Z7982 Long term (current) use of aspirin: Secondary | ICD-10-CM | POA: Diagnosis not present

## 2017-03-23 DIAGNOSIS — L97519 Non-pressure chronic ulcer of other part of right foot with unspecified severity: Secondary | ICD-10-CM | POA: Diagnosis not present

## 2017-03-23 DIAGNOSIS — E11622 Type 2 diabetes mellitus with other skin ulcer: Secondary | ICD-10-CM | POA: Diagnosis not present

## 2017-03-23 DIAGNOSIS — E1169 Type 2 diabetes mellitus with other specified complication: Secondary | ICD-10-CM | POA: Diagnosis not present

## 2017-03-23 DIAGNOSIS — Z8673 Personal history of transient ischemic attack (TIA), and cerebral infarction without residual deficits: Secondary | ICD-10-CM | POA: Diagnosis not present

## 2017-03-23 DIAGNOSIS — Z8249 Family history of ischemic heart disease and other diseases of the circulatory system: Secondary | ICD-10-CM | POA: Diagnosis not present

## 2017-03-23 DIAGNOSIS — M25572 Pain in left ankle and joints of left foot: Secondary | ICD-10-CM | POA: Diagnosis not present

## 2017-03-23 DIAGNOSIS — Z87891 Personal history of nicotine dependence: Secondary | ICD-10-CM | POA: Diagnosis not present

## 2017-03-23 DIAGNOSIS — M86671 Other chronic osteomyelitis, right ankle and foot: Secondary | ICD-10-CM | POA: Diagnosis not present

## 2017-03-23 DIAGNOSIS — L97929 Non-pressure chronic ulcer of unspecified part of left lower leg with unspecified severity: Secondary | ICD-10-CM | POA: Diagnosis not present

## 2017-03-23 DIAGNOSIS — Z9181 History of falling: Secondary | ICD-10-CM | POA: Diagnosis not present

## 2017-03-23 DIAGNOSIS — E11628 Type 2 diabetes mellitus with other skin complications: Secondary | ICD-10-CM | POA: Diagnosis not present

## 2017-03-23 DIAGNOSIS — E1142 Type 2 diabetes mellitus with diabetic polyneuropathy: Secondary | ICD-10-CM | POA: Diagnosis not present

## 2017-03-23 DIAGNOSIS — M86179 Other acute osteomyelitis, unspecified ankle and foot: Secondary | ICD-10-CM | POA: Diagnosis not present

## 2017-03-23 DIAGNOSIS — Z9049 Acquired absence of other specified parts of digestive tract: Secondary | ICD-10-CM | POA: Diagnosis not present

## 2017-03-23 DIAGNOSIS — I482 Chronic atrial fibrillation: Secondary | ICD-10-CM | POA: Diagnosis not present

## 2017-03-23 DIAGNOSIS — L97509 Non-pressure chronic ulcer of other part of unspecified foot with unspecified severity: Secondary | ICD-10-CM | POA: Diagnosis not present

## 2017-03-23 DIAGNOSIS — N390 Urinary tract infection, site not specified: Secondary | ICD-10-CM | POA: Diagnosis not present

## 2017-03-23 DIAGNOSIS — I2510 Atherosclerotic heart disease of native coronary artery without angina pectoris: Secondary | ICD-10-CM | POA: Diagnosis not present

## 2017-03-23 DIAGNOSIS — E039 Hypothyroidism, unspecified: Secondary | ICD-10-CM | POA: Diagnosis not present

## 2017-03-23 DIAGNOSIS — S1190XD Unspecified open wound of unspecified part of neck, subsequent encounter: Secondary | ICD-10-CM | POA: Diagnosis not present

## 2017-03-23 DIAGNOSIS — E1151 Type 2 diabetes mellitus with diabetic peripheral angiopathy without gangrene: Secondary | ICD-10-CM | POA: Diagnosis not present

## 2017-03-23 DIAGNOSIS — E1122 Type 2 diabetes mellitus with diabetic chronic kidney disease: Secondary | ICD-10-CM | POA: Diagnosis not present

## 2017-03-23 DIAGNOSIS — A419 Sepsis, unspecified organism: Secondary | ICD-10-CM | POA: Diagnosis not present

## 2017-03-23 DIAGNOSIS — Z96643 Presence of artificial hip joint, bilateral: Secondary | ICD-10-CM | POA: Diagnosis not present

## 2017-03-23 DIAGNOSIS — I5032 Chronic diastolic (congestive) heart failure: Secondary | ICD-10-CM | POA: Diagnosis not present

## 2017-03-23 DIAGNOSIS — Z89411 Acquired absence of right great toe: Secondary | ICD-10-CM | POA: Diagnosis not present

## 2017-03-23 DIAGNOSIS — M79672 Pain in left foot: Secondary | ICD-10-CM | POA: Diagnosis not present

## 2017-03-23 DIAGNOSIS — I70238 Atherosclerosis of native arteries of right leg with ulceration of other part of lower right leg: Secondary | ICD-10-CM | POA: Diagnosis not present

## 2017-03-23 DIAGNOSIS — E119 Type 2 diabetes mellitus without complications: Secondary | ICD-10-CM | POA: Diagnosis not present

## 2017-03-23 DIAGNOSIS — K219 Gastro-esophageal reflux disease without esophagitis: Secondary | ICD-10-CM | POA: Diagnosis not present

## 2017-03-23 DIAGNOSIS — I130 Hypertensive heart and chronic kidney disease with heart failure and stage 1 through stage 4 chronic kidney disease, or unspecified chronic kidney disease: Secondary | ICD-10-CM | POA: Diagnosis not present

## 2017-03-23 DIAGNOSIS — E785 Hyperlipidemia, unspecified: Secondary | ICD-10-CM | POA: Diagnosis not present

## 2017-03-23 DIAGNOSIS — E1140 Type 2 diabetes mellitus with diabetic neuropathy, unspecified: Secondary | ICD-10-CM | POA: Diagnosis not present

## 2017-03-23 DIAGNOSIS — M869 Osteomyelitis, unspecified: Secondary | ICD-10-CM | POA: Diagnosis not present

## 2017-03-23 DIAGNOSIS — I480 Paroxysmal atrial fibrillation: Secondary | ICD-10-CM | POA: Diagnosis not present

## 2017-03-23 DIAGNOSIS — R918 Other nonspecific abnormal finding of lung field: Secondary | ICD-10-CM | POA: Diagnosis not present

## 2017-03-23 DIAGNOSIS — E1165 Type 2 diabetes mellitus with hyperglycemia: Secondary | ICD-10-CM | POA: Diagnosis not present

## 2017-03-23 DIAGNOSIS — D509 Iron deficiency anemia, unspecified: Secondary | ICD-10-CM | POA: Diagnosis not present

## 2017-03-23 DIAGNOSIS — E876 Hypokalemia: Secondary | ICD-10-CM | POA: Diagnosis not present

## 2017-03-23 DIAGNOSIS — B370 Candidal stomatitis: Secondary | ICD-10-CM | POA: Diagnosis not present

## 2017-03-23 DIAGNOSIS — E1152 Type 2 diabetes mellitus with diabetic peripheral angiopathy with gangrene: Secondary | ICD-10-CM | POA: Diagnosis not present

## 2017-03-23 DIAGNOSIS — Z888 Allergy status to other drugs, medicaments and biological substances status: Secondary | ICD-10-CM | POA: Diagnosis not present

## 2017-03-23 DIAGNOSIS — Z4781 Encounter for orthopedic aftercare following surgical amputation: Secondary | ICD-10-CM | POA: Diagnosis not present

## 2017-03-23 DIAGNOSIS — Z9861 Coronary angioplasty status: Secondary | ICD-10-CM | POA: Diagnosis not present

## 2017-03-23 DIAGNOSIS — Z8631 Personal history of diabetic foot ulcer: Secondary | ICD-10-CM | POA: Diagnosis not present

## 2017-03-23 DIAGNOSIS — I110 Hypertensive heart disease with heart failure: Secondary | ICD-10-CM | POA: Diagnosis not present

## 2017-03-23 DIAGNOSIS — T8130XA Disruption of wound, unspecified, initial encounter: Secondary | ICD-10-CM | POA: Diagnosis not present

## 2017-03-23 DIAGNOSIS — Z9842 Cataract extraction status, left eye: Secondary | ICD-10-CM | POA: Diagnosis not present

## 2017-03-23 DIAGNOSIS — Z961 Presence of intraocular lens: Secondary | ICD-10-CM | POA: Diagnosis not present

## 2017-03-23 DIAGNOSIS — M6281 Muscle weakness (generalized): Secondary | ICD-10-CM | POA: Diagnosis not present

## 2017-03-23 DIAGNOSIS — R197 Diarrhea, unspecified: Secondary | ICD-10-CM | POA: Diagnosis not present

## 2017-03-23 DIAGNOSIS — Z792 Long term (current) use of antibiotics: Secondary | ICD-10-CM | POA: Diagnosis not present

## 2017-03-23 DIAGNOSIS — N9089 Other specified noninflammatory disorders of vulva and perineum: Secondary | ICD-10-CM | POA: Diagnosis not present

## 2017-03-23 DIAGNOSIS — N183 Chronic kidney disease, stage 3 (moderate): Secondary | ICD-10-CM | POA: Diagnosis not present

## 2017-03-23 DIAGNOSIS — Z8674 Personal history of sudden cardiac arrest: Secondary | ICD-10-CM | POA: Diagnosis not present

## 2017-03-23 DIAGNOSIS — I10 Essential (primary) hypertension: Secondary | ICD-10-CM | POA: Diagnosis not present

## 2017-03-23 LAB — BASIC METABOLIC PANEL
ANION GAP: 14 (ref 5–15)
BUN: 33 mg/dL — ABNORMAL HIGH (ref 6–20)
CALCIUM: 8.7 mg/dL — AB (ref 8.9–10.3)
CO2: 23 mmol/L (ref 22–32)
CREATININE: 1.22 mg/dL — AB (ref 0.44–1.00)
Chloride: 96 mmol/L — ABNORMAL LOW (ref 101–111)
GFR calc Af Amer: 47 mL/min — ABNORMAL LOW (ref >60)
GFR calc non Af Amer: 40 mL/min — ABNORMAL LOW (ref >60)
GLUCOSE: 175 mg/dL — AB (ref 65–99)
Potassium: 3.4 mmol/L — ABNORMAL LOW (ref 3.5–5.1)
Sodium: 133 mmol/L — ABNORMAL LOW (ref 135–145)

## 2017-03-23 LAB — GLUCOSE, CAPILLARY
GLUCOSE-CAPILLARY: 330 mg/dL — AB (ref 65–99)
Glucose-Capillary: 244 mg/dL — ABNORMAL HIGH (ref 65–99)
Glucose-Capillary: 189 mg/dL — ABNORMAL HIGH (ref 65–99)

## 2017-03-23 LAB — C-REACTIVE PROTEIN: CRP: 9.9 mg/dL — AB (ref <1.0)

## 2017-03-23 MED ORDER — PIPERACILLIN SOD-TAZOBACTAM SO 3.375 (3-0.375) G IV SOLR: 3.3750 g | Freq: Four times a day (QID) | INTRAVENOUS | 0 refills | Status: DC

## 2017-03-23 MED ORDER — SODIUM CHLORIDE 0.9% FLUSH: 10.0000 mL | INTRAVENOUS | Status: DC | PRN

## 2017-03-23 MED ORDER — FUROSEMIDE 20 MG PO TABS: 20.0000 mg | ORAL_TABLET | Freq: Every day | ORAL | 0 refills | Status: DC

## 2017-03-23 MED ORDER — POTASSIUM CHLORIDE CRYS ER 20 MEQ PO TBCR
40.0000 meq | EXTENDED_RELEASE_TABLET | Freq: Once | ORAL | Status: AC
  Administered 2017-03-23: 40 meq via ORAL
  Filled 2017-03-23: qty 2

## 2017-03-23 MED ORDER — OXYCODONE HCL 5 MG PO TABS: 5.0000 mg | ORAL_TABLET | ORAL | 0 refills | Status: DC | PRN

## 2017-03-23 MED ORDER — METOPROLOL TARTRATE 25 MG PO TABS: 25.0000 mg | ORAL_TABLET | Freq: Two times a day (BID) | ORAL | 0 refills | Status: DC

## 2017-03-23 MED ORDER — AMIODARONE HCL 200 MG PO TABS: 200.0000 mg | ORAL_TABLET | Freq: Every day | ORAL | 2 refills | Status: AC

## 2017-03-23 MED ORDER — SENNOSIDES-DOCUSATE SODIUM 8.6-50 MG PO TABS: 1.0000 | ORAL_TABLET | Freq: Two times a day (BID) | ORAL | 2 refills | Status: DC

## 2017-03-23 MED ORDER — BISACODYL 5 MG PO TBEC: 10.0000 mg | DELAYED_RELEASE_TABLET | ORAL | 0 refills | Status: DC

## 2017-03-23 MED ORDER — SODIUM CHLORIDE 0.9% FLUSH: 10.0000 mL | Freq: Two times a day (BID) | INTRAVENOUS | Status: DC

## 2017-03-23 MED ORDER — BISACODYL 5 MG PO TBEC
10.0000 mg | DELAYED_RELEASE_TABLET | ORAL | Status: DC
  Administered 2017-03-23: 10 mg via ORAL
  Filled 2017-03-23: qty 2

## 2017-03-23 NOTE — Discharge Summary (Addendum)
[facility] [place] NAME: Sharon [name]    MR#:  [id number]  DATE OF BIRTH:  September 26, 1933  DATE OF ADMISSION:  03/19/2017   ADMITTING PHYSICIAN: Lance Coon, MD  DATE OF DISCHARGE:  03/23/17  PRIMARY CARE PHYSICIAN: Rusty Aus, MD   ADMISSION DIAGNOSIS:   Hypokalemia [E87.6] Hyponatremia [E87.1] Elevated troponin [R74.8] Acute kidney injury ([place]) [N17.9] Sepsis, due to unspecified organism ([place]) [A41.9] Septic shock ([place]) [A41.9, R65.21]  DISCHARGE DIAGNOSIS:   Principal Problem:   Septic shock ([place]) Active Problems:   Chronic diastolic heart failure ([place])   HTN (hypertension)   DM (diabetes mellitus), type 2, uncontrolled ([place])   Diabetic foot infection ([place])   PAD (peripheral artery disease) ([place])   CKD (chronic kidney disease), stage III ([place])   SECONDARY DIAGNOSIS:   Past Medical History:  Diagnosis Date  . Anemia   . Atrial fibrillation ([place])   . Cardiac arrest ([place])   . Cataract   . CHF (congestive heart failure) ([place])   . Diabetes mellitus without complication ([place])   . Edema    feet/ankles occas  . Hip fracture ([place])   . Hyperlipidemia   . Hypertension   . Hypothyroid   . Neuropathy   . Osteomyelitis ([place])    left first metatarsal  . Stroke [facility])     HOSPITAL COURSE:   84 year old female with past medical history significant for atrial fibrillation, diastolic CHF, diabetes, hypertension, neuropathy and history of stroke was admitted secondary to right foot ulceration and infection.  1. Sepsis- on admission secondary to diabetic foot infection -Required pressors initially, right now blood pressure is stable -MRI showing osteomyelitis. Appreciate podiatry consult -Status post first toe ray amputation - received vancomycin, and zosyn in hospital. Will need 4 weeks of IV zosyn at discharge - ID consult appreciated, PICC line ordered - dressing changes per podiatry- non weight bearing of right leg  per podiatry - will need rehab  2. DM- uncontrolled sugars,  a1c 7.1 - restart her home glimiperide at discharge  3.  Paroxysmal A. Fib- on amiodarone - added metoprolol low dose - not on anticoagulation, continue aspirin  4. Chronic diastolic CHF- well compensated - CXR with mild upper airway interstitial opacities. Already on antibiotics.  - oral lasix at discharge at low dose for now  5. Acute delirium-secondary to being in the hospital and on IV steroids. -resolved at this time. Off steroids at discharge  6. Constipation- meds added-had a bowel movement last night  7. Hypokalemia- replaced  PT consult recommended rehabilitation Possible discharge today or tomorrow    DISCHARGE CONDITIONS:   Guarded  CONSULTS OBTAINED:   Treatment Team:  Sharlotte Alamo, DPM Leonel Ramsay, MD  DRUG ALLERGIES:   Allergies  Allergen Reactions  . Carbamazepine Other (See Comments)    Reaction: unknown  . Codeine Other (See Comments)    Other reaction(s): Other (See Comments) Patient states she did not feel herself when she took it. unknown  . Cymbalta [Duloxetine Hcl] Other (See Comments)    Confusion, disorientation  . Duloxetine     Other reaction(s): Other (See Comments) Confusion, disorientation  . Lyrica [Pregabalin] Other (See Comments)    Patient and son states this makes the patient confused.   DISCHARGE MEDICATIONS:   Allergies as of 03/23/2017      Reactions   Carbamazepine Other (See Comments)   Reaction: unknown   Codeine Other (See Comments)   Other reaction(s): Other (  See Comments) Patient states she did not feel herself when she took it. unknown   Cymbalta [duloxetine Hcl] Other (See Comments)   Confusion, disorientation   Duloxetine    Other reaction(s): Other (See Comments) Confusion, disorientation   Lyrica [pregabalin] Other (See Comments)   Patient and son states this makes the patient confused.      Medication List    STOP taking  these medications   metolazone 2.5 MG tablet Commonly known as:  ZAROXOLYN     TAKE these medications   acetaminophen 500 MG tablet Commonly known as:  TYLENOL Take 500 mg by mouth every 4 (four) hours as needed for mild pain or moderate pain.   amiodarone 200 MG tablet Commonly known as:  PACERONE Take 1 tablet (200 mg total) by mouth daily. Start taking on:  03/24/2017 What changed:    how much to take  how to take this  when to take this  additional instructions   aspirin 81 MG tablet Take 81 mg by mouth daily.   bisacodyl 5 MG EC tablet Commonly known as:  DULCOLAX Take 2 tablets (10 mg total) by mouth every other day. Start taking on:  03/25/2017   cholecalciferol 1000 units tablet Commonly known as:  VITAMIN D Take 1,000 Units by mouth daily.   furosemide 20 MG tablet Commonly known as:  LASIX Take 1 tablet (20 mg total) by mouth daily. What changed:    medication strength  how much to take   gabapentin 600 MG tablet Commonly known as:  NEURONTIN 600 mg 4 (four) times daily.   glimepiride 2 MG tablet Commonly known as:  AMARYL Take 2 mg by mouth daily with breakfast.   levothyroxine 112 MCG tablet Commonly known as:  SYNTHROID, LEVOTHROID Take one tablet by mouth once daily 30 minutes before breakfast once daily for hypothyroid   metoprolol tartrate 25 MG tablet Commonly known as:  LOPRESSOR Take 1 tablet (25 mg total) by mouth 2 (two) times daily.   oxyCODONE 5 MG immediate release tablet Commonly known as:  Oxy IR/ROXICODONE Take 1 tablet (5 mg total) by mouth every 4 (four) hours as needed for moderate pain or severe pain.   piperacillin-tazobactam 3.375 (3-0.375) g injection Commonly known as:  ZOSYN Inject 3,375 mg (3.375 g total) into the vein every 6 (six) hours for 28 days.   PRESERVISION AREDS 2+MULTI VIT PO Take 1 capsule by mouth 2 (two) times daily.   senna-docusate 8.6-50 MG tablet Commonly known as:  Senokot-S Take 1 tablet  by mouth 2 (two) times daily.   simvastatin 40 MG tablet Commonly known as:  ZOCOR Take one tablet by mouth once daily at bedtime for cholesterol        DISCHARGE INSTRUCTIONS:   1. PCP follow-up in 1-2 weeks 2. Podiatry follow-up in 1 week  DIET:   Cardiac diet  ACTIVITY:   Activity as tolerated  OXYGEN:   Home Oxygen: No.  Oxygen Delivery: room air  DISCHARGE LOCATION:   nursing home   If you experience worsening of your admission symptoms, develop shortness of breath, life threatening emergency, suicidal or homicidal thoughts you must seek medical attention immediately by calling 911 or calling your MD immediately  if symptoms less severe.  You Must read complete instructions/literature along with all the possible adverse reactions/side effects for all the Medicines you take and that have been prescribed to you. Take any new Medicines after you have completely understood and accpet all the possible adverse  reactions/side effects.   Please note  You were cared for by a hospitalist during your hospital stay. If you have any questions about your discharge medications or the care you received while you were in the hospital after you are discharged, you can call the unit and asked to speak with the hospitalist on call if the hospitalist that took care of you is not available. Once you are discharged, your primary care physician will handle any further medical issues. Please note that NO REFILLS for any discharge medications will be authorized once you are discharged, as it is imperative that you return to your primary care physician (or establish a relationship with a primary care physician if you do not have one) for your aftercare needs so that they can reassess your need for medications and monitor your lab values.    On the day of Discharge:  VITAL SIGNS:   Blood pressure (!) 104/56, pulse 64, temperature 98 F (36.7 C), temperature source Oral, resp. rate 18, height  5\' 5"  (1.651 m), weight 64.8 kg (142 lb 13.7 oz), SpO2 96 %.  PHYSICAL EXAMINATION:    GENERAL:  84 y.o.-year-old elderly patient lying in the bed with no acute distress.  EYES: Pupils equal, round, reactive to light and accommodation. No scleral icterus. Extraocular muscles intact.  HEENT: Head atraumatic, normocephalic. Oropharynx and nasopharynx clear.  NECK:  Supple, no jugular venous distention. No thyroid enlargement, no tenderness.  LUNGS: Normal breath sounds bilaterally, no wheezing, rales,rhonchi or crepitation. No use of accessory muscles of respiration. Decreased at the bases CARDIOVASCULAR: S1, S2 normal. No rubs, or gallops. 2/6 systolic murmur present ABDOMEN: Soft, nontender, nondistended. Bowel sounds present. No organomegaly or mass.  EXTREMITIES: right foot in dressing. No pedal edema, cyanosis, or clubbing.  NEUROLOGIC: Cranial nerves II through XII are intact. Muscle strength 5/5 in all extremities. Sensation intact. Gait not checked.  PSYCHIATRIC: The patient is alert and oriented x 3, no hallucinations SKIN: No obvious rash, lesion, or ulcer.    DATA REVIEW:   CBC Recent Labs  Lab 03/22/17 0707  WBC 23.3*  HGB 10.5*  HCT 33.7*  PLT 377    Chemistries  Recent Labs  Lab 03/19/17 2018  03/23/17 0355  NA 128*   < > 133*  K 2.7*   < > 3.4*  CL 91*   < > 96*  CO2 24   < > 23  GLUCOSE 343*   < > 175*  BUN 44*   < > 33*  CREATININE 1.56*   < > 1.22*  CALCIUM 8.1*   < > 8.7*  AST 33  --   --   ALT 14  --   --   ALKPHOS 76  --   --   BILITOT 0.7  --   --    < > = values in this interval not displayed.     Microbiology Results  Results for orders placed or performed during the hospital encounter of 03/19/17  Blood Culture (routine x 2)     Status: None (Preliminary result)   Collection Time: 03/19/17  8:18 PM  Result Value Ref Range Status   Specimen Description BLOOD RIGHT ARM  Final   Special Requests   Final    BOTTLES DRAWN AEROBIC AND  ANAEROBIC Blood Culture adequate volume   Culture   Final    NO GROWTH 4 DAYS Performed at [facility], [address], [place], [place] [address]    Report Status PENDING  Incomplete  Urine culture     Status: Abnormal   Collection Time: 03/19/17  8:20 PM  Result Value Ref Range Status   Specimen Description   Final    URINE, RANDOM Performed at [facility], [address] [place], [place] [address]    Special Requests   Final    NONE Performed at [facility], [address] [place], [place] [address]    Culture >=100,000 COLONIES/mL ENTEROCOCCUS FAECALIS (A)  Final   Report Status 03/22/2017 FINAL  Final   Organism ID, Bacteria ENTEROCOCCUS FAECALIS (A)  Final      Susceptibility   Enterococcus faecalis - MIC*    AMPICILLIN <=2 SENSITIVE Sensitive     LEVOFLOXACIN >=8 RESISTANT Resistant     NITROFURANTOIN <=16 SENSITIVE Sensitive     VANCOMYCIN 1 SENSITIVE Sensitive     * >=100,000 COLONIES/mL ENTEROCOCCUS FAECALIS  Blood Culture (routine x 2)     Status: None (Preliminary result)   Collection Time: 03/19/17  8:30 PM  Result Value Ref Range Status   Specimen Description BLOOD RIGHT ARM  Final   Special Requests   Final    BOTTLES DRAWN AEROBIC AND ANAEROBIC Blood Culture adequate volume   Culture   Final    NO GROWTH 4 DAYS Performed at [facility], [address] [place], [place] [address]    Report Status PENDING  Incomplete  MRSA PCR Screening     Status: None   Collection Time: 03/20/17  9:43 AM  Result Value Ref Range Status   MRSA by PCR NEGATIVE NEGATIVE Final    Comment:        The GeneXpert MRSA Assay (FDA approved for NASAL specimens only), is one component of a comprehensive MRSA colonization surveillance program. It is not intended to diagnose MRSA infection nor to guide or monitor treatment for MRSA infections. Performed at [facility], [address] [place], [place] [address]     Aerobic/Anaerobic Culture (surgical/deep wound)     Status: None (Preliminary result)   Collection Time: 03/21/17  8:57 AM  Result Value Ref Range Status   Specimen Description ABSCESS RIGHT FOOT  Final   Special Requests PATIENT ON FOLLOWING VANC AND ZOSYN  Final   Gram Stain   Final    NO WBC SEEN FEW GRAM POSITIVE COCCI IN PAIRS RARE GRAM NEGATIVE RODS RARE GRAM POSITIVE RODS    Culture   Final    CULTURE REINCUBATED FOR BETTER GROWTH HOLDING FOR POSSIBLE ANAEROBE Performed at [facility], [place] [address] [place], [place] [address]    Report Status PENDING  Incomplete    RADIOLOGY:  Dg Chest Port 1 View  Result Date: 03/23/2017 CLINICAL DATA:  84 year old female admitted with sepsis and right foot osteomyelitis, postoperative day 2 status post amputation of the right foot 1st ray and midfoot debridement. EXAM: PORTABLE CHEST 1 VIEW COMPARISON:  03/19/2017 and earlier. FINDINGS: Portable AP upright view at 0700 hours. Stable lung volumes. Stable cardiac size and mediastinal contours. Visualized tracheal air column is within normal limits. No pneumothorax. No pulmonary edema or pleural effusion. Mildly increased left greater than right upper lung interstitial opacity. No other confluent opacity. Calcified aortic atherosclerosis. IMPRESSION: 1. Mild asymmetric and increased upper lung interstitial opacity since 03/19/2017. Consider developing bilateral respiratory infection. 2. No pleural effusion or other acute cardiopulmonary abnormality. Electronically Signed   By: Genevie Ann M.D.   On: 03/23/2017 07:56     Management plans discussed  with the patient, family and they are in agreement.  CODE STATUS:  FULL CODE    Code Status Orders  (From admission, onward)           Ordered      03/20/17 1121    Code Status History    Date Active Date Inactive Code Status Order ID Comments User Context   03/20/2017 01:40 03/20/2017 11:21 Full Code [id number]  Lance Coon, MD ED    07/07/2015 03:53 07/09/2015 15:20 Full Code [id number]  Saundra Shelling, MD Inpatient   02/07/2015 16:05 02/12/2015 19:21 Full Code [id number]  Dustin Flock, MD Inpatient   11/05/2014 15:51 11/09/2014 15:36 Full Code [id number]  Gladstone Lighter, MD Inpatient   10/10/2014 11:12 10/17/2014 17:50 Full Code [id number]  Epifanio Lesches, MD Inpatient    Advance Directive Documentation     Most Recent Value  Type of Advance Directive  Out of facility DNR (pink MOST or yellow form)  Pre-existing out of facility DNR order (yellow form or pink MOST form)  No data  "MOST" Form in Place?  No data      TOTAL TIME TAKING CARE OF THIS PATIENT: 38 minutes.    Gladstone Lighter M.D on 03/23/2017 at 1:39 PM  Between 7am to 6pm - Pager - [phone]  After 6pm go to [email or web address] - Proofreader  [employer] [facility] [facility]  Office  [phone]  CC: Primary care physician; Rusty Aus, MD   Note: This dictation was prepared with Dragon dictation along with smaller phrase technology. Any transcriptional errors that result from this process are unintentional.

## 2017-03-23 NOTE — Consult Note (Signed)
[facility]     Reason for Consult: DM foot ulcer with osteomyelitis  Referring Physician: Mickel Baas Date of Admission:  03/19/2017   Principal Problem:   Septic shock ([place]) Active Problems:   Chronic diastolic heart failure (HCC)   HTN (hypertension)   DM (diabetes mellitus), type 2, uncontrolled ([place])   Diabetic foot infection ([place])   PAD (peripheral artery disease) ([place])   CKD (chronic kidney disease), stage III ([place])   HPI: Sharon [name] is a 84 y.o. female with a history of diabetes admitted with confusion and lethargy presumed related to different medication she was taken.  She was also noted to have a right foot diabetic ulcer with surrounding cellulitis.  On admission her white blood count was 17 temperature 101.3.  She was started on IV vancomycin and Zosyn.  She had a negative chest x-ray however an MRI showed diffuse soft tissue swelling of the forefoot without focal drainable fluid or abscess.  In the first proximal phalanx of the great toe there was findings concerning for acute osteomyelitis.  Is also concern for possible chronic osteomyelitis.  There was a healing third metatarsal neck fracture.  She was seen by podiatry and on March 9 underwent amputation of the right hallux with partial first ray amputation and I&D of the right midfoot.  Significant purulence was encountered around the plantar aspect of the hallux.  Cultures are pending.  Blood cultures are negative.  Urine cultures positive for enterococcus.  MRSA PCR has been negative.   Past Medical History:  Diagnosis Date  . Anemia   . Atrial fibrillation ([place])   . Cardiac arrest ([place])   . Cataract   . CHF (congestive heart failure) ([place])   . Diabetes mellitus without complication ([place])   . Edema    feet/ankles occas  . Hip fracture ([place])   . Hyperlipidemia   . Hypertension   . Hypothyroid   . Neuropathy   . Osteomyelitis ([place])    left first metatarsal  . Stroke [facility])    Past  Surgical History:  Procedure Laterality Date  . ACHILLES TENDON SURGERY Left 02/08/2015   Procedure: ACHILLES LENGTHENING/KIDNER;  Surgeon: Albertine Patricia, DPM;  Location: [facility] ORS;  Service: Podiatry;  Laterality: Left;  . APPENDECTOMY    . CARDIAC CATHETERIZATION  08/25/13  . CATARACT EXTRACTION W/PHACO Left 07/20/2014   Procedure: CATARACT EXTRACTION PHACO AND INTRAOCULAR LENS PLACEMENT (IOC);  Surgeon: Leandrew Koyanagi, MD;  Location: [facility] ORS;  Service: Ophthalmology;  Laterality: Left;  [place]  1:18                 AP     23.6             CDE   9.69      lot #[phone]  . CHOLECYSTECTOMY    . CORONARY ANGIOPLASTY    . HALLUX VALGUS AKIN Left 02/08/2015   Procedure: HALLUX VALGUS AKIN/ KELLER;  Surgeon: Albertine Patricia, DPM;  Location: [facility] ORS;  Service: Podiatry;  Laterality: Left;  IVA with Local needs 1 hour for this case   . HEMIARTHROPLASTY HIP Right   . HEMIARTHROPLASTY HIP Left   . INCISION AND DRAINAGE Left 10/11/2014   Procedure: Removal of infected tibial sessmoid;  Surgeon: Albertine Patricia, DPM;  Location: [facility] ORS;  Service: Podiatry;  Laterality: Left;  . IRRIGATION AND DEBRIDEMENT FOOT Right 03/21/2017   Procedure: IRRIGATION AND DEBRIDEMENT FOOT right great toe amputation;  Surgeon: Sharlotte Alamo, DPM;  Location: [facility]  ORS;  Service: Podiatry;  Laterality: Right;   Social History   Tobacco Use  . Smoking status: Former Smoker    Packs/day: 0.50    Years: 7.00    Pack years: 3.50    Types: Cigarettes    Last attempt to quit: 05/30/1989    Years since quitting: 27.8  . Smokeless tobacco: Never Used  Substance Use Topics  . Alcohol use: No    Alcohol/week: 0.0 oz  . Drug use: No   Family History  Problem Relation Age of Onset  . Heart attack Mother     Allergies:  Allergies  Allergen Reactions  . Carbamazepine Other (See Comments)    Reaction: unknown  . Codeine Other (See Comments)    Other reaction(s): Other (See Comments) Patient states she did not feel  herself when she took it. unknown  . Cymbalta [Duloxetine Hcl] Other (See Comments)    Confusion, disorientation  . Duloxetine     Other reaction(s): Other (See Comments) Confusion, disorientation  . Lyrica [Pregabalin] Other (See Comments)    Patient and son states this makes the patient confused.    Current antibiotics: Antibiotics Given (last 72 hours)    Date/Time Action Medication Dose Rate   03/20/17 1325 New Bag/Given   piperacillin-tazobactam (ZOSYN) IVPB 3.375 g 3.375 g 12.5 mL/hr   03/20/17 1446 New Bag/Given   vancomycin (VANCOCIN) IVPB 1000 mg/200 mL premix 1,000 mg 200 mL/hr   03/20/17 2301 New Bag/Given   piperacillin-tazobactam (ZOSYN) IVPB 3.375 g 3.375 g 12.5 mL/hr   03/21/17 6144 New Bag/Given   piperacillin-tazobactam (ZOSYN) IVPB 3.375 g 3.375 g 12.5 mL/hr   03/21/17 1405 New Bag/Given   piperacillin-tazobactam (ZOSYN) IVPB 3.375 g 3.375 g 12.5 mL/hr   03/21/17 2104 New Bag/Given   piperacillin-tazobactam (ZOSYN) IVPB 3.375 g 3.375 g 12.5 mL/hr   03/22/17 0601 New Bag/Given   piperacillin-tazobactam (ZOSYN) IVPB 3.375 g 3.375 g 12.5 mL/hr   03/22/17 1102 New Bag/Given   vancomycin (VANCOCIN) IVPB 1000 mg/200 mL premix 1,000 mg 200 mL/hr   03/22/17 1620 New Bag/Given   piperacillin-tazobactam (ZOSYN) IVPB 3.375 g 3.375 g 12.5 mL/hr   03/22/17 2224 New Bag/Given   piperacillin-tazobactam (ZOSYN) IVPB 3.375 g 3.375 g 12.5 mL/hr   03/23/17 3154 New Bag/Given   piperacillin-tazobactam (ZOSYN) IVPB 3.375 g 3.375 g 12.5 mL/hr   03/23/17 1232 New Bag/Given   vancomycin (VANCOCIN) IVPB 1000 mg/200 mL premix 1,000 mg 200 mL/hr      MEDICATIONS: . amiodarone  200 mg Oral Daily  . aspirin  81 mg Oral Daily  . bisacodyl  10 mg Oral QODAY  . heparin  5,000 Units Subcutaneous Q8H  . insulin aspart  0-15 Units Subcutaneous TID WC  . insulin aspart  0-5 Units Subcutaneous QHS  . insulin glargine  10 Units Subcutaneous Daily  . levothyroxine  112 mcg Oral QAC  breakfast  . metoprolol tartrate  50 mg Oral BID  . multivitamin-lutein  1 capsule Oral Daily  . ondansetron (ZOFRAN) IV  4 mg Intravenous Once  . predniSONE  50 mg Oral Q breakfast  . protein supplement shake  11 oz Oral BID BM  . senna-docusate  1 tablet Oral BID  . simvastatin  40 mg Oral q1800    Review of Systems - 11 systems reviewed and negative per HPI   OBJECTIVE: Temp:  [97.6 F (36.4 C)-98.2 F (36.8 C)] 98 F (36.7 C) (03/11 0750) Pulse Rate:  [64-112] 64 (03/11 1053) Resp:  [16-18]  18 (03/11 0750) BP: (104-147)/(56-90) 104/56 (03/11 1002) SpO2:  [92 %-96 %] 96 % (03/11 1053) Physical Exam  Constitutional:  Awake interactive but somewhat confused  Frail  HENT: [place]/AT, PERRLA, no scleral icterus Mouth/Throat: Oropharynx is clear and moist. No oropharyngeal exudate.  Cardiovascular: Normal rate, regular rhythm and normal heart sounds. Exam reveals no gallop and no friction rub.  No murmur heard.  Pulmonary/Chest: Effort normal and breath sounds normal. No respiratory distress.  has no wheezes.  Neck = supple, no nuchal rigidity Abdominal: Soft. Bowel sounds are normal.  exhibits no distension. There is no tenderness.  Lymphadenopathy: no cervical adenopathy. No axillary adenopathy Neurological: alert and oriented to person, place, and time.  Skin: R foot wrapped post op Ext 1+ edema bil LE Psychiatric: a normal mood and affect.  behavior is normal.    LABS: Results for orders placed or performed during the hospital encounter of 03/19/17 (from the past 48 hour(s))  Hemoglobin A1c     Status: Abnormal   Collection Time: 03/21/17  1:27 PM  Result Value Ref Range   Hgb A1c MFr Bld 7.1 (H) 4.8 - 5.6 %    Comment: (NOTE) Pre diabetes:          5.7%-6.4% Diabetes:              >6.4% Glycemic control for   <7.0% adults with diabetes    Mean Plasma Glucose 157.07 mg/dL    Comment: Performed at [facility], [place] [address] [place], [place] [address]   Glucose, capillary     Status: Abnormal   Collection Time: 03/21/17  4:42 PM  Result Value Ref Range   Glucose-Capillary 215 (H) 65 - 99 mg/dL  Glucose, capillary     Status: Abnormal   Collection Time: 03/21/17  9:48 PM  Result Value Ref Range   Glucose-Capillary 292 (H) 65 - 99 mg/dL   Comment 1 Notify RN   Procalcitonin     Status: None   Collection Time: 03/22/17  7:07 AM  Result Value Ref Range   Procalcitonin 0.59 ng/mL    Comment:        Interpretation: PCT > 0.5 ng/mL and <= 2 ng/mL: Systemic infection (sepsis) is possible, but other conditions are known to elevate PCT as well. (NOTE)       Sepsis PCT Algorithm           Lower Respiratory Tract                                      Infection PCT Algorithm    ----------------------------     ----------------------------         PCT < 0.25 ng/mL                PCT < 0.10 ng/mL         Strongly encourage             Strongly discourage   discontinuation of antibiotics    initiation of antibiotics    ----------------------------     -----------------------------       PCT 0.25 - 0.50 ng/mL            PCT 0.10 - 0.25 ng/mL               OR       >80% decrease in PCT  Discourage initiation of                                            antibiotics      Encourage discontinuation           of antibiotics    ----------------------------     -----------------------------         PCT >= 0.50 ng/mL              PCT 0.26 - 0.50 ng/mL                AND       <80% decrease in PCT             Encourage initiation of                                             antibiotics       Encourage continuation           of antibiotics    ----------------------------     -----------------------------        PCT >= 0.50 ng/mL                  PCT > 0.50 ng/mL               AND         increase in PCT                  Strongly encourage                                      initiation of antibiotics    Strongly encourage  escalation           of antibiotics                                     -----------------------------                                           PCT <= 0.25 ng/mL                                                 OR                                        > 80% decrease in PCT                                     Discontinue / Do not initiate  antibiotics Performed at [facility], [address] [place], [place] [address]   CBC with Differential/Platelet     Status: Abnormal   Collection Time: 03/22/17  7:07 AM  Result Value Ref Range   WBC 23.3 (H) 3.6 - 11.0 K/uL   RBC 4.41 3.80 - 5.20 MIL/uL   Hemoglobin 10.5 (L) 12.0 - 16.0 g/dL   HCT 33.7 (L) 35.0 - 47.0 %   MCV 76.5 (L) 80.0 - 100.0 fL   MCH 23.8 (L) 26.0 - 34.0 pg   MCHC 31.1 (L) 32.0 - 36.0 g/dL   RDW 23.3 (H) 11.5 - 14.5 %   Platelets 377 150 - 440 K/uL   Neutrophils Relative % 91 %   Neutro Abs 21.2 (H) 1.4 - 6.5 K/uL   Lymphocytes Relative 3 %   Lymphs Abs 0.7 (L) 1.0 - 3.6 K/uL   Monocytes Relative 6 %   Monocytes Absolute 1.3 (H) 0.2 - 0.9 K/uL   Eosinophils Relative 0 %   Eosinophils Absolute 0.0 0 - 0.7 K/uL   Basophils Relative 0 %   Basophils Absolute 0.1 0 - 0.1 K/uL    Comment: Performed at [facility], [address] [place], [place] [address]  Basic metabolic panel     Status: Abnormal   Collection Time: 03/22/17  7:07 AM  Result Value Ref Range   Sodium 135 135 - 145 mmol/L   Potassium 3.0 (L) 3.5 - 5.1 mmol/L   Chloride 96 (L) 101 - 111 mmol/L   CO2 25 22 - 32 mmol/L   Glucose, Bld 321 (H) 65 - 99 mg/dL   BUN 30 (H) 6 - 20 mg/dL   Creatinine, Ser 1.15 (H) 0.44 - 1.00 mg/dL   Calcium 9.0 8.9 - 10.3 mg/dL   GFR calc non Af Amer 43 (L) >60 mL/min   GFR calc Af Amer 50 (L) >60 mL/min    Comment: (NOTE) The eGFR has been calculated using the CKD EPI equation. This calculation has not been validated in all clinical situations. eGFR's  persistently <60 mL/min signify possible Chronic Kidney Disease.    Anion gap 14 5 - 15    Comment: Performed at [facility], [address] [place], [place] [address]  Glucose, capillary     Status: Abnormal   Collection Time: 03/22/17  8:02 AM  Result Value Ref Range   Glucose-Capillary 321 (H) 65 - 99 mg/dL   Comment 1 Notify RN   Glucose, capillary     Status: Abnormal   Collection Time: 03/22/17 11:26 AM  Result Value Ref Range   Glucose-Capillary 188 (H) 65 - 99 mg/dL   Comment 1 Notify RN   Glucose, capillary     Status: Abnormal   Collection Time: 03/22/17  1:08 PM  Result Value Ref Range   Glucose-Capillary 229 (H) 65 - 99 mg/dL  Glucose, capillary     Status: Abnormal   Collection Time: 03/22/17  5:15 PM  Result Value Ref Range   Glucose-Capillary 196 (H) 65 - 99 mg/dL  Glucose, capillary     Status: Abnormal   Collection Time: 03/22/17  9:45 PM  Result Value Ref Range   Glucose-Capillary 295 (H) 65 - 99 mg/dL   Comment 1 Notify RN   Basic metabolic panel     Status: Abnormal   Collection Time: 03/23/17  3:55 AM  Result Value Ref Range   Sodium 133 (L) 135 - 145 mmol/L   Potassium 3.4 (L) 3.5 -  5.1 mmol/L   Chloride 96 (L) 101 - 111 mmol/L   CO2 23 22 - 32 mmol/L   Glucose, Bld 175 (H) 65 - 99 mg/dL   BUN 33 (H) 6 - 20 mg/dL   Creatinine, Ser 1.22 (H) 0.44 - 1.00 mg/dL   Calcium 8.7 (L) 8.9 - 10.3 mg/dL   GFR calc non Af Amer 40 (L) >60 mL/min   GFR calc Af Amer 47 (L) >60 mL/min    Comment: (NOTE) The eGFR has been calculated using the CKD EPI equation. This calculation has not been validated in all clinical situations. eGFR's persistently <60 mL/min signify possible Chronic Kidney Disease.    Anion gap 14 5 - 15    Comment: Performed at [facility], [address] [place], [place] [address]  Glucose, capillary     Status: Abnormal   Collection Time: 03/23/17  7:42 AM  Result Value Ref Range   Glucose-Capillary 244 (H) 65 - 99  mg/dL  Glucose, capillary     Status: Abnormal   Collection Time: 03/23/17 11:34 AM  Result Value Ref Range   Glucose-Capillary 189 (H) 65 - 99 mg/dL   No components found for: ESR, C REACTIVE PROTEIN MICRO: Recent Results (from the past 720 hour(s))  Blood Culture (routine x 2)     Status: None (Preliminary result)   Collection Time: 03/19/17  8:18 PM  Result Value Ref Range Status   Specimen Description BLOOD RIGHT ARM  Final   Special Requests   Final    BOTTLES DRAWN AEROBIC AND ANAEROBIC Blood Culture adequate volume   Culture   Final    NO GROWTH 4 DAYS Performed at [facility], [address] [place], [place] [address]    Report Status PENDING  Incomplete  Urine culture     Status: Abnormal   Collection Time: 03/19/17  8:20 PM  Result Value Ref Range Status   Specimen Description   Final    URINE, RANDOM Performed at [facility], [address], [place], [place] [address]    Special Requests   Final    NONE Performed at [facility], [address] [place], [place] [address]    Culture >=100,000 COLONIES/mL ENTEROCOCCUS FAECALIS (A)  Final   Report Status 03/22/2017 FINAL  Final   Organism ID, Bacteria ENTEROCOCCUS FAECALIS (A)  Final      Susceptibility   Enterococcus faecalis - MIC*    AMPICILLIN <=2 SENSITIVE Sensitive     LEVOFLOXACIN >=8 RESISTANT Resistant     NITROFURANTOIN <=16 SENSITIVE Sensitive     VANCOMYCIN 1 SENSITIVE Sensitive     * >=100,000 COLONIES/mL ENTEROCOCCUS FAECALIS  Blood Culture (routine x 2)     Status: None (Preliminary result)   Collection Time: 03/19/17  8:30 PM  Result Value Ref Range Status   Specimen Description BLOOD RIGHT ARM  Final   Special Requests   Final    BOTTLES DRAWN AEROBIC AND ANAEROBIC Blood Culture adequate volume   Culture   Final    NO GROWTH 4 DAYS Performed at [facility], [address] [place], [place] [address]    Report Status PENDING  Incomplete  MRSA PCR  Screening     Status: None   Collection Time: 03/20/17  9:43 AM  Result Value Ref Range Status   MRSA by PCR NEGATIVE NEGATIVE Final    Comment:        The GeneXpert MRSA Assay (FDA approved for NASAL specimens  only), is one component of a comprehensive MRSA colonization surveillance program. It is not intended to diagnose MRSA infection nor to guide or monitor treatment for MRSA infections. Performed at [facility], [address] [place], [place] [address]   Aerobic/Anaerobic Culture (surgical/deep wound)     Status: None (Preliminary result)   Collection Time: 03/21/17  8:57 AM  Result Value Ref Range Status   Specimen Description ABSCESS RIGHT FOOT  Final   Special Requests PATIENT ON FOLLOWING VANC AND ZOSYN  Final   Gram Stain   Final    NO WBC SEEN FEW GRAM POSITIVE COCCI IN PAIRS RARE GRAM NEGATIVE RODS RARE GRAM POSITIVE RODS    Culture   Final    CULTURE REINCUBATED FOR BETTER GROWTH HOLDING FOR POSSIBLE ANAEROBE Performed at [facility], [place] [address] [place], [place] [address]    Report Status PENDING  Incomplete    IMAGING: Dg Chest 1 View  Result Date: 03/19/2017 CLINICAL DATA:  Short of breath EXAM: CHEST 1 VIEW COMPARISON:  10/15/2014 FINDINGS: Mild biapical scarring. No focal pulmonary opacity or pleural effusion. Stable cardiomediastinal silhouette with aortic atherosclerosis. No pneumothorax. Old right second rib fracture. IMPRESSION: No active disease. Electronically Signed   By: Donavan Foil M.D.   On: 03/19/2017 19:59   Dg Ankle Complete Right  Result Date: 03/19/2017 CLINICAL DATA:  Erythema. Evaluate for possible osteomyelitis. Personal history of left foot osteomyelitis in 2016. EXAM: RIGHT ANKLE - COMPLETE 3+ VIEW COMPARISON:  None. FINDINGS: Mild diffuse soft tissue swelling. Osseous demineralization. No evidence of acute fracture or dislocation. Ankle mortise intact with mild joint space narrowing. No evidence of osteomyelitis.  IMPRESSION: 1. No acute osseous abnormality.  No evidence of osteomyelitis. 2. Osseous demineralization. 3. Mild osteoarthritis. Electronically Signed   By: Evangeline Dakin M.D.   On: 03/19/2017 21:21   Ct Head Wo Contrast  Result Date: 03/19/2017 CLINICAL DATA:  84 year old with progressively worsening confusion over the past 2 days. Patient fell yesterday. Initial encounter. EXAM: CT HEAD WITHOUT CONTRAST TECHNIQUE: Contiguous axial images were obtained from the base of the skull through the vertex without intravenous contrast. COMPARISON:  12/02/2014, 11/05/2014. FINDINGS: Brain: Ventricular system normal in size and appearance for age. Mild age related cortical atrophy, unchanged. Moderate changes of small vessel disease of the white matter diffusely, unchanged. No mass lesion. No midline shift. No acute hemorrhage or hematoma. No extra-axial fluid collections. No evidence of acute infarction. Vascular: Mild bilateral carotid siphon and vertebral artery atherosclerosis. No hyperdense vessel. Skull: Marked hyperostosis frontalis interna bilaterally. No skull fracture or other focal osseous abnormality involving the skull. Sinuses/Orbits: Visualized paranasal sinuses, bilateral mastoid air cells and bilateral middle ear cavities well-aerated. Visualized orbits and globes intact. Hyperdense lens involving the right globe, query cataract. Other: None. IMPRESSION: 1. No acute intracranial abnormality. 2. Stable mild age related cortical atrophy and stable moderate chronic microvascular ischemic changes of the white matter. Electronically Signed   By: Evangeline Dakin M.D.   On: 03/19/2017 20:11   Mr Foot Right Wo Contrast  Result Date: 03/20/2017 CLINICAL DATA:  Diabetic with foot ulcerations noted increasing erythema and swelling with drainage. EXAM: MRI OF THE RIGHT FOREFOOT WITHOUT CONTRAST TECHNIQUE: Multiplanar, multisequence MR imaging of the right forefoot was performed. No intravenous contrast was  administered. COMPARISON:  None. FINDINGS: Bones/Joint/Cartilage [place] edema of the great toe is identified involving the distal phalanx and head of the first proximal phalanx, series 7, image 24 suspicious for changes of  acute osteomyelitis. Additionally there is intraosseous elevated T2 signal within the distal first metatarsal associated with a cortical defect along the plantar aspect suspicious for a cloaca from chronic osteomyelitis. Callus is noted about a healing distal third metatarsal fracture involving the neck. Small focus of T2 hyperintensities seen that could represent a small posttraumatic cyst given its wall circumscribed margins. Ligaments No focal abnormality Muscles and Tendons No intramuscular mass or fluid collection. Focal abnormality of the extensor and flexor tendons crossing the forefoot. Soft tissues Plantar soft tissue ulceration of the forefoot adjacent to the first MTP articulation. Diffuse soft tissue edema of the included forefoot consistent with cellulitis and/or third spacing of fluid. IMPRESSION: 1. Diffuse soft tissue swelling of the included forefoot without focal drainable fluid collection or abscess identified. 2. Marrow abnormality of the distal phalanx and head of first proximal phalanx of the great toe associated with soft tissue ulceration along the plantar aspect the forefoot at the level of the first metatarsal head. Findings are concerning for changes of acute osteomyelitis. 3. Possible cloaca involving the distal first metatarsal with intraosseous elevated T2 signal suspicious for changes of chronic osteomyelitis. 4. Healing third metatarsal neck fracture with small focus of T2 hyperintensity more likely related to posttraumatic degenerative cyst though the possibility of chronic osteomyelitis is not completely excluded as well. Electronically Signed   By: Ashley Royalty M.D.   On: 03/20/2017 22:53   Dg Chest Port 1 View  Result Date: 03/23/2017 CLINICAL DATA:   84 year old female admitted with sepsis and right foot osteomyelitis, postoperative day 2 status post amputation of the right foot 1st ray and midfoot debridement. EXAM: PORTABLE CHEST 1 VIEW COMPARISON:  03/19/2017 and earlier. FINDINGS: Portable AP upright view at 0700 hours. Stable lung volumes. Stable cardiac size and mediastinal contours. Visualized tracheal air column is within normal limits. No pneumothorax. No pulmonary edema or pleural effusion. Mildly increased left greater than right upper lung interstitial opacity. No other confluent opacity. Calcified aortic atherosclerosis. IMPRESSION: 1. Mild asymmetric and increased upper lung interstitial opacity since 03/19/2017. Consider developing bilateral respiratory infection. 2. No pleural effusion or other acute cardiopulmonary abnormality. Electronically Signed   By: Genevie Ann M.D.   On: 03/23/2017 07:56   Dg Foot Complete Left  Result Date: 03/19/2017 CLINICAL DATA:  Erythema. Evaluate for possible osteomyelitis. Personal history of left foot osteomyelitis in 2016. EXAM: LEFT FOOT - COMPLETE 3+ VIEW COMPARISON:  Left foot MRI 10/10/2014 left foot x-rays 02/23/2012. FINDINGS: No evidence of acute fracture or dislocation. Osseous demineralization. Hammertoe deformity. Likely remote healed fracture involving the proximal phalanx of the second toe. No evidence of osteomyelitis. Narrowing of the IP joint spaces of the toes and narrowing of joint spaces in the midfoot, unchanged. Small plantar calcaneal spur. Small enthesopathic spur at the insertion of the Achilles tendon on the posterior calcaneus. IMPRESSION: 1. No acute osseous abnormality.  No evidence of osteomyelitis. 2. Osseous demineralization. 3. Mild osteoarthritis. Electronically Signed   By: Evangeline Dakin M.D.   On: 03/19/2017 21:20   Dg Foot Complete Right  Result Date: 03/19/2017 CLINICAL DATA:  Erythema. Evaluate for possible osteomyelitis. Personal history of left foot osteomyelitis in  2016. EXAM: RIGHT FOOT COMPLETE - 3+ VIEW COMPARISON:  None. FINDINGS: Soft tissue ulceration with gas in the tissues of the plantar surface underlying the head of the first metatarsal. No evidence of osteomyelitis. No evidence of acute fracture or dislocation. Osseous demineralization. Remote fracture involving the head of the third metatarsal with incomplete  union. Hammertoe deformity. Joint space narrowing involving the IP joints of the toes and joints of the midfoot. IMPRESSION: 1. Soft tissue ulceration with gas in the tissues of the plantar surface of the foot underlying the head of the first metatarsal. No evidence of osteomyelitis. 2. Remote fracture involving the head of the third metatarsal with incomplete union. 3. Mild osteoarthritis. 4. Osseous demineralization. Electronically Signed   By: Evangeline Dakin M.D.   On: 03/19/2017 21:24    Assessment:   Sharon [name] is a 84 y.o. female with DM foot ulcer with underlying osteomyelitis now s.p I and D and resection of 1st toe and Ray amputation on 3/9 and I and D of R midfoot. Cultures are pending.  Blood cultures are negative.  Urine cultures positive for enterococcus.  MRSA PCR has been negative. Will need 4 weeks IV abx. Is going to facility  [facility] PICC See IV abx order sheet- will use zosyn and then can fu on cx after dc.  I can see in 3-4 weeks follow up.  Thank you very much for allowing me to participate in the care of this patient. Please call with questions.   Cheral Marker. Ola Spurr, MD

## 2017-03-23 NOTE — Progress Notes (Signed)
   [place] [facility] Day: 4 days Sharon [name] is a 84 y.o. female presenting with Drug Overdose .   Advance care planning discussed with patient  And her son. All questions in regards to overall condition and expected prognosis answered. The decision was made to change current code status. Patient wants to be a full code and son agrees.  CODE STATUS: Full Code Time spent: 18 minutes

## 2017-03-23 NOTE — Progress Notes (Signed)
Peripherally Inserted Central Catheter/Midline Placement  The IV Nurse has discussed with the patient and/or persons authorized to consent for the patient, the purpose of this procedure and the potential benefits and risks involved with this procedure.  The benefits include less needle sticks, lab draws from the catheter, and the patient may be discharged home with the catheter. Risks include, but not limited to, infection, bleeding, blood clot (thrombus formation), and puncture of an artery; nerve damage and irregular heartbeat and possibility to perform a PICC exchange if needed/ordered by physician.  Alternatives to this procedure were also discussed.  Bard Power PICC patient education guide, fact sheet on infection prevention and patient information card has been provided to patient /or left at bedside.    PICC/Midline Placement Documentation  PICC Single Lumen 63/94/32 PICC Left Basilic 42 cm 0 cm (Active)  Indication for Insertion or Continuance of Line Home intravenous therapies (PICC only) 03/23/2017  4:02 PM  Exposed Catheter (cm) 0 cm 03/23/2017  4:02 PM  Site Assessment Clean;Dry;Intact 03/23/2017  4:02 PM  Line Status Flushed;Saline locked;Blood return noted 03/23/2017  4:02 PM  Dressing Type Transparent 03/23/2017  4:02 PM  Dressing Status Clean;Dry;Intact;Antimicrobial disc in place 03/23/2017  4:02 PM  Dressing Change Due 03/30/17 03/23/2017  4:02 PM       [facility] Payment 03/23/2017, 4:04 PM

## 2017-03-23 NOTE — Progress Notes (Signed)
Physical Therapy Treatment Patient Details Name: Sharon [name] MRN: [id number] DOB: 12-29-1933 Today's Date: 03/23/2017    History of Present Illness 84 y/o female here with confusion and lethargy, had b/l LE diabetic ulcerations and ultimately had R 1st ray     PT Comments    Pt agreeable to PT; reports 10/10 pain in R foot (feels due to neuropathy more so than surgery). Pt agreeable to bed exercises only and participates well with LE strengthening/arom exercises. Continue to progress strength and endurance and encourage out of bed activity to improve all functional mobility.    Follow Up Recommendations  SNF     Equipment Recommendations       Recommendations for Other Services       Precautions / Restrictions Precautions Precautions: Fall Restrictions Weight Bearing Restrictions: Yes RLE Weight Bearing: Non weight bearing    Mobility  Bed Mobility               General bed mobility comments: Not tested; pt refueses out of bed  Transfers                    Ambulation/Gait                 Stairs            Wheelchair Mobility    Modified Rankin (Stroke Patients Only)       Balance                                            Cognition Arousal/Alertness: Awake/alert Behavior During Therapy: WFL for tasks assessed/performed Overall Cognitive Status: Within Functional Limits for tasks assessed                                        Exercises General Exercises - Lower Extremity Ankle Circles/Pumps: AROM;Both;20 reps;Supine Quad Sets: Strengthening;Both;20 reps;Supine Gluteal Sets: Strengthening;Both;20 reps;Supine Short Arc Quad: AROM;Both;20 reps;Supine Heel Slides: AROM;Both;20 reps;Supine(sets of 10) Hip ABduction/ADduction: AROM;Both;20 reps;Supine(sets of 10) Straight Leg Raises: Strengthening;Both;20 reps;Supine(sets of 10)    General Comments        Pertinent Vitals/Pain Pain  Assessment: 0-10 Pain Score: 10-Worst pain ever Pain Location: R foot Pain Descriptors / Indicators: Pins and needles(pt states "neuropathy pain") Pain Intervention(s): Limited activity within patient's tolerance;Monitored during session    Home Living                      Prior Function            PT Goals (current goals can now be found in the care plan section) Progress towards PT goals: Progressing toward goals    Frequency    7X/week      PT Plan Current plan remains appropriate    Co-evaluation              AM-PAC PT "6 Clicks" Daily Activity  Outcome Measure  Difficulty turning over in bed (including adjusting bedclothes, sheets and blankets)?: None Difficulty moving from lying on back to sitting on the side of the bed? : A Little Difficulty sitting down on and standing up from a chair with arms (e.g., wheelchair, bedside commode, etc,.)?: Unable Help needed moving to and from a bed to chair (including a wheelchair)?: A  Little Help needed walking in hospital room?: Total Help needed climbing 3-5 steps with a railing? : Total 6 Click Score: 13    End of Session   Activity Tolerance: Patient tolerated treatment well Patient left: in bed;with call bell/phone within reach;with bed alarm set   PT Visit Diagnosis: Muscle weakness (generalized) (M62.81);Difficulty in walking, not elsewhere classified (R26.2)     Time: 9702-6378 PT Time Calculation (min) (ACUTE ONLY): 23 min  Charges:  $Therapeutic Exercise: 23-37 mins                    G Codes:        Larae Grooms, PTA 03/23/2017, 11:16 AM

## 2017-03-23 NOTE — Anesthesia Postprocedure Evaluation (Signed)
Anesthesia Post Note  Patient: Sharon [name]  Procedure(s) Performed: IRRIGATION AND DEBRIDEMENT FOOT right great toe amputation (Right )  Patient location during evaluation: PACU Anesthesia Type: General Level of consciousness: awake and alert Pain management: pain level controlled Vital Signs Assessment: post-procedure vital signs reviewed and stable Respiratory status: spontaneous breathing, nonlabored ventilation, respiratory function stable and patient connected to nasal cannula oxygen Cardiovascular status: blood pressure returned to baseline and stable Postop Assessment: no apparent nausea or vomiting Anesthetic complications: no     Last Vitals:  Vitals:   03/23/17 0251 03/23/17 0750  BP: 136/81 (!) 133/58  Pulse: 69 80  Resp: 16 18  Temp: 36.8 C 36.7 C  SpO2: 94% 93%    Last Pain:  Vitals:   03/23/17 0808  TempSrc:   PainSc: 10-Worst pain ever                 Martha Clan

## 2017-03-23 NOTE — Progress Notes (Signed)
[facility] [place] NAME: Sharon [name]    MR#:  [id number]  DATE OF BIRTH:  Aug 04, 1933  SUBJECTIVE:  CHIEF COMPLAINT:   Chief Complaint  Patient presents with  . Drug Overdose   - postop day 2 after right first toe partial ray amputation. -more alert and oriented. Delirium seems to have cleared. -Complains of significant right foot pain  REVIEW OF SYSTEMS:  Review of Systems  Constitutional: Negative for chills, fever and malaise/fatigue.  HENT: Negative for congestion, ear discharge, hearing loss and nosebleeds.   Eyes: Negative for blurred vision and double vision.  Respiratory: Negative for cough, shortness of breath and wheezing.   Cardiovascular: Negative for chest pain, palpitations and leg swelling.  Gastrointestinal: Negative for abdominal pain, constipation, diarrhea, nausea and vomiting.  Genitourinary: Negative for dysuria.  Musculoskeletal: Positive for joint pain and myalgias.  Neurological: Negative for dizziness, speech change, focal weakness, seizures and headaches.  Psychiatric/Behavioral: Negative for depression.    DRUG ALLERGIES:   Allergies  Allergen Reactions  . Carbamazepine Other (See Comments)    Reaction: unknown  . Codeine Other (See Comments)    Other reaction(s): Other (See Comments) Patient states she did not feel herself when she took it. unknown  . Cymbalta [Duloxetine Hcl] Other (See Comments)    Confusion, disorientation  . Duloxetine     Other reaction(s): Other (See Comments) Confusion, disorientation  . Lyrica [Pregabalin] Other (See Comments)    Patient and son states this makes the patient confused.    VITALS:  Blood pressure (!) 133/58, pulse 80, temperature 98 F (36.7 C), temperature source Oral, resp. rate 18, height 5\' 5"  (1.651 m), weight 64.8 kg (142 lb 13.7 oz), SpO2 93 %.  PHYSICAL EXAMINATION:  Physical Exam  GENERAL:  84 y.o.-year-old elderly patient lying in the  bed with no acute distress.  EYES: Pupils equal, round, reactive to light and accommodation. No scleral icterus. Extraocular muscles intact.  HEENT: Head atraumatic, normocephalic. Oropharynx and nasopharynx clear.  NECK:  Supple, no jugular venous distention. No thyroid enlargement, no tenderness.  LUNGS: Normal breath sounds bilaterally, no wheezing, rales,rhonchi or crepitation. No use of accessory muscles of respiration. Decreased at the bases CARDIOVASCULAR: S1, S2 normal. No rubs, or gallops. 2/6 systolic murmur present ABDOMEN: Soft, nontender, nondistended. Bowel sounds present. No organomegaly or mass.  EXTREMITIES: right foot in dressing. No pedal edema, cyanosis, or clubbing.  NEUROLOGIC: Cranial nerves II through XII are intact. Muscle strength 5/5 in all extremities. Sensation intact. Gait not checked.  PSYCHIATRIC: The patient is alert and oriented x 3, no hallucinations SKIN: No obvious rash, lesion, or ulcer.    LABORATORY PANEL:   CBC Recent Labs  Lab 03/22/17 0707  WBC 23.3*  HGB 10.5*  HCT 33.7*  PLT 377   ------------------------------------------------------------------------------------------------------------------  Chemistries  Recent Labs  Lab 03/19/17 2018  03/23/17 0355  NA 128*   < > 133*  K 2.7*   < > 3.4*  CL 91*   < > 96*  CO2 24   < > 23  GLUCOSE 343*   < > 175*  BUN 44*   < > 33*  CREATININE 1.56*   < > 1.22*  CALCIUM 8.1*   < > 8.7*  AST 33  --   --   ALT 14  --   --   ALKPHOS 76  --   --   BILITOT 0.7  --   --    < > =  values in this interval not displayed.   ------------------------------------------------------------------------------------------------------------------  Cardiac Enzymes Recent Labs  Lab 03/20/17 1348  TROPONINI 0.11*   ------------------------------------------------------------------------------------------------------------------  RADIOLOGY:  Dg Chest Port 1 View  Result Date: 03/23/2017 CLINICAL DATA:   84 year old female admitted with sepsis and right foot osteomyelitis, postoperative day 2 status post amputation of the right foot 1st ray and midfoot debridement. EXAM: PORTABLE CHEST 1 VIEW COMPARISON:  03/19/2017 and earlier. FINDINGS: Portable AP upright view at 0700 hours. Stable lung volumes. Stable cardiac size and mediastinal contours. Visualized tracheal air column is within normal limits. No pneumothorax. No pulmonary edema or pleural effusion. Mildly increased left greater than right upper lung interstitial opacity. No other confluent opacity. Calcified aortic atherosclerosis. IMPRESSION: 1. Mild asymmetric and increased upper lung interstitial opacity since 03/19/2017. Consider developing bilateral respiratory infection. 2. No pleural effusion or other acute cardiopulmonary abnormality. Electronically Signed   By: Genevie Ann M.D.   On: 03/23/2017 07:56    EKG:   Orders placed or performed during the hospital encounter of 03/19/17  . ED EKG  . ED EKG    ASSESSMENT AND PLAN:   84 year old female with past medical history significant for atrial fibrillation, diastolic CHF, diabetes, hypertension, neuropathy and history of stroke was admitted secondary to right foot ulceration and infection.  1. Sepsis- on admission secondary to diabetic foot infection -Required pressors initially, right now blood pressure is stable -MRI showing possible osteomyelitis. Appreciate podiatry consult -Status post first toe amputation - on vancomycin, and zosyn - ID consult pending- for outpatient antibiotics - dressing changes per podiatry- non weight bearing of right leg per podiatry - will need rehab  2. DM- uncontrolled sugars,  a1c 7.1 - on lantus- SSI  3.  Paroxysmal A. Fib- on amiodarone - added metoprolol - not on anticoagulation  4. Chronic diastolic CHF- well compensated - CXR with mild upper airway interstitial opacities. Already on antibiotics. Discontinue IV fluids. Sats are stable  5.  Acute delirium-likely being in the hospital and also on IV steroids. -resolved at this time. As steroids dose is decreased  6. DVT prophylaxis- SQ heparin  7. Constipation- meds added-had a bowel movement last night  8. Hypokalemia- replaced  PT consult recommended rehabilitation   All the records are reviewed and case discussed with Care Management/Social Worker. Management plans discussed with the patient, family and they are in agreement.  CODE STATUS: DNR  TOTAL TIME TAKING CARE OF THIS PATIENT: 36 minutes.   POSSIBLE D/C IN 1-2 DAYS, DEPENDING ON CLINICAL CONDITION.   Gladstone Lighter M.D on 03/23/2017 at 9:38 AM  Between 7am to 6pm - Pager - [phone]  After 6pm go to [email or web address] - password [email or web address] [facility]  Office  [phone]  CC: Primary care physician; Rusty Aus, MD

## 2017-03-23 NOTE — Clinical Social Work Placement (Signed)
   CLINICAL SOCIAL WORK PLACEMENT  NOTE  Date:  03/23/2017  Patient Details  Name: Sharon [name] MRN: [id number] Date of Birth: 01/23/1933  Clinical Social Work is seeking post-discharge placement for this patient at the [place] level of care (*CSW will initial, date and re-position this form in  chart as items are completed):  Yes   Patient/family provided with [place] Work Department's list of facilities offering this level of care within the geographic area requested by the patient (or if unable, by the patient's family).  Yes   Patient/family informed of their freedom to choose among providers that offer the needed level of care, that participate in Medicare, Medicaid or managed care program needed by the patient, have an available bed and are willing to accept the patient.  Yes   Patient/family informed of [place]'s ownership interest in [facility] and [facility], as well as of the fact that they are under no obligation to receive care at these facilities.  PASRR submitted to EDS on       PASRR number received on       Existing PASRR number confirmed on 03/22/17     FL2 transmitted to all facilities in geographic area requested by pt/family on 03/22/17     FL2 transmitted to all facilities within larger geographic area on       Patient informed that his/her managed care company has contracts with or will negotiate with certain facilities, including the following:        Yes   Patient/family informed of bed offers received.  Patient chooses bed at [facility] )     Physician recommends and patient chooses bed at      Patient to be transferred to   on  .  Patient to be transferred to facility by       Patient family notified on   of transfer.  Name of family member notified:        PHYSICIAN Please sign DNR     Additional Comment:    _______________________________________________ [place] Flett, Veronia Beets,  LCSW 03/23/2017, 11:16 AM

## 2017-03-23 NOTE — Progress Notes (Signed)
Daily Progress Note   Subjective  - 2 Days Post-Op  F/u 1st ray amputation.  Objective Vitals:   03/23/17 0251 03/23/17 0750 03/23/17 1002 03/23/17 1053  BP: 136/81 (!) 133/58 (!) 104/56   Pulse: 69 80 64 64  Resp: 16 18    Temp: 98.2 F (36.8 C) 98 F (36.7 C)    TempSrc: Oral Oral    SpO2: 94% 93%  96%  Weight:      Height:        Physical Exam: Wound continues to improve.  Minimal erythema and edema.  Packing removed.  Mild bloody drainage.  No purulence.  Laboratory CBC    Component Value Date/Time   WBC 23.3 (H) 03/22/2017 0707   HGB 10.5 (L) 03/22/2017 0707   HGB 10.1 (L) 12/28/2013 0500   HCT 33.7 (L) 03/22/2017 0707   HCT 31.6 (L) 12/28/2013 0500   PLT 377 03/22/2017 0707   PLT 234 12/28/2013 0500    BMET    Component Value Date/Time   NA 133 (L) 03/23/2017 0355   NA 136 (A) 10/19/2014   NA 132 (L) 04/13/2014 1416   K 3.4 (L) 03/23/2017 0355   K 3.8 04/13/2014 1416   CL 96 (L) 03/23/2017 0355   CL 100 (L) 04/13/2014 1416   CO2 23 03/23/2017 0355   CO2 24 04/13/2014 1416   GLUCOSE 175 (H) 03/23/2017 0355   GLUCOSE 123 (H) 04/13/2014 1416   BUN 33 (H) 03/23/2017 0355   BUN 17 10/21/2014   BUN 15 04/13/2014 1416   CREATININE 1.22 (H) 03/23/2017 0355   CREATININE 1.04 (H) 04/13/2014 1416   CALCIUM 8.7 (L) 03/23/2017 0355   CALCIUM 8.7 (L) 04/13/2014 1416   GFRNONAA 40 (L) 03/23/2017 0355   GFRNONAA 51 (L) 04/13/2014 1416   GFRAA 47 (L) 03/23/2017 0355   GFRAA 60 (L) 04/13/2014 1416    Assessment/Planning: S/p ray amputation   C/W minimal WB to right foot.  Dressing changes daily.  Cleanse wound with saline and apply bulky sterile dressing.  Packing is d/c'd.  F/U with Dr. Cleda Mccreedy in 2 weeks.  OK to d/c from podiatry standpoint.  Samara Deist A  03/23/2017, 5:06 PM

## 2017-03-23 NOTE — Progress Notes (Signed)
Inpatient Diabetes Program Recommendations  AACE/ADA: New Consensus Statement on Inpatient Glycemic Control (2015)  Target Ranges:  Prepandial:   less than 140 mg/dL      Peak postprandial:   less than 180 mg/dL (1-2 hours)      Critically ill patients:  140 - 180 mg/dL  Results for LAYANI, FORONDA (MRN [id number]) as of 03/23/2017 10:27  Ref. Range 03/22/2017 08:02 03/22/2017 11:26 03/22/2017 13:08 03/22/2017 17:15 03/22/2017 21:45 03/23/2017 07:42  Glucose-Capillary Latest Ref Range: 65 - 99 mg/dL 321 (H) 188 (H) 229 (H) 196 (H) 295 (H) 244 (H)    Review of Glycemic Control  Diabetes history: DM2 Outpatient Diabetes medications: Amaryl 2 mg daily Current orders for Inpatient glycemic control: Lantus 10 units daily, Novolog 0-15 units TID with meals, Novolog 0-5 units QHS; Prednisone 50 mg QAM  Inpatient Diabetes Program Recommendations:  Insulin - Basal: If steroids are continued, please consider increasing Lantus to 12 units daily.  Thanks, Barnie Alderman, RN, MSN, CDE Diabetes Coordinator Inpatient Diabetes Program [phone] (Team Pager from 8am to 5pm)

## 2017-03-23 NOTE — Progress Notes (Signed)
Patient is medically stable for D/C to [facility] today. Per [facility] admissions coordinator at [facility] patient can come today to room 203-B. RN will call report at [phone] and arrange EMS for transport. [facility] SNF authorization has been received, authorization # [id number]. Clinical Education officer, museum (CSW) sent D/C orders to [facility] [employer] [facility] via [employer]. Patient is aware of above. CSW contacted patient's son [name] and made him aware of above. Please reconsult if future social work needs arise. CSW signing off.   [employer], LCSW [phone]

## 2017-03-23 NOTE — Clinical Social Work Placement (Signed)
   CLINICAL SOCIAL WORK PLACEMENT  NOTE  Date:  03/23/2017  Patient Details  Name: Sharon [name] MRN: [id number] Date of Birth: June 16, 1933  Clinical Social Work is seeking post-discharge placement for this patient at the [place] level of care (*CSW will initial, date and re-position this form in  chart as items are completed):  Yes   Patient/family provided with [place] Work Department's list of facilities offering this level of care within the geographic area requested by the patient (or if unable, by the patient's family).  Yes   Patient/family informed of their freedom to choose among providers that offer the needed level of care, that participate in Medicare, Medicaid or managed care program needed by the patient, have an available bed and are willing to accept the patient.  Yes   Patient/family informed of [place]'s ownership interest in [facility] and [facility], as well as of the fact that they are under no obligation to receive care at these facilities.  PASRR submitted to EDS on       PASRR number received on       Existing PASRR number confirmed on 03/22/17     FL2 transmitted to all facilities in geographic area requested by pt/family on 03/22/17     FL2 transmitted to all facilities within larger geographic area on       Patient informed that his/her managed care company has contracts with or will negotiate with certain facilities, including the following:        Yes   Patient/family informed of bed offers received.  Patient chooses bed at [facility] )     Physician recommends and patient chooses bed at      Patient to be transferred to [facility] ) on 03/23/17.  Patient to be transferred to facility by [facility] )     Patient family notified on 03/23/17 of transfer.  Name of family member notified:  ([name] is aware of D/C today. )     PHYSICIAN       Additional  Comment:    _______________________________________________ Sharon [name], Sharon Beets, LCSW 03/23/2017, 2:40 PM

## 2017-03-23 NOTE — Progress Notes (Signed)
Clinical Education officer, museum (CSW) met with patient alone at bedside and presented SNF bed offers. Patient chose [facility] and is okay with a semi-private room. [facility] SNF authorization has been started. [facility] admissions coordinator at [facility] is aware of above.   [employer], LCSW [phone]

## 2017-03-23 NOTE — Progress Notes (Signed)
Infectious Disease Long Term IV Antibiotic Orders Yachet Mattson [place]in 1933/10/29  Diagnosis: DM foot infection   Culture results Pending.  LABS Lab Results  Component Value Date   CREATININE 1.22 (H) 03/23/2017   Lab Results  Component Value Date   WBC 23.3 (H) 03/22/2017   HGB 10.5 (L) 03/22/2017   HCT 33.7 (L) 03/22/2017   MCV 76.5 (L) 03/22/2017   PLT 377 03/22/2017   Lab Results  Component Value Date   ESRSEDRATE 68 (H) 01/29/2017   Lab Results  Component Value Date   CRP 1.4 (H) 10/16/2014    Allergies:  Allergies  Allergen Reactions  . Carbamazepine Other (See Comments)    Reaction: unknown  . Codeine Other (See Comments)    Other reaction(s): Other (See Comments) Patient states she did not feel herself when she took it. unknown  . Cymbalta [Duloxetine Hcl] Other (See Comments)    Confusion, disorientation  . Duloxetine     Other reaction(s): Other (See Comments) Confusion, disorientation  . Lyrica [Pregabalin] Other (See Comments)    Patient and son states this makes the patient confused.    Discharge antibiotics Zosyn  3.375 grams every 6 Hours  PICC Care per protocol Labs weekly while on IV antibiotics -FAX weekly labs to [phone] CBC w diff   Comprehensive met panel CRP   Planned duration of antibiotics 4 weeks  Stop date 04/20/17 Follow up clinic date Week of 4/1   Leonel Ramsay, MD

## 2017-03-24 DIAGNOSIS — I482 Chronic atrial fibrillation: Secondary | ICD-10-CM | POA: Diagnosis not present

## 2017-03-24 DIAGNOSIS — I2510 Atherosclerotic heart disease of native coronary artery without angina pectoris: Secondary | ICD-10-CM | POA: Diagnosis not present

## 2017-03-24 DIAGNOSIS — E1151 Type 2 diabetes mellitus with diabetic peripheral angiopathy without gangrene: Secondary | ICD-10-CM | POA: Diagnosis not present

## 2017-03-24 DIAGNOSIS — I10 Essential (primary) hypertension: Secondary | ICD-10-CM | POA: Diagnosis not present

## 2017-03-24 LAB — CULTURE, BLOOD (ROUTINE X 2)
Culture: NO GROWTH
Culture: NO GROWTH
SPECIAL REQUESTS: ADEQUATE
Special Requests: ADEQUATE

## 2017-03-25 ENCOUNTER — Other Ambulatory Visit
Admission: RE | Admit: 2017-03-25 | Discharge: 2017-03-25 | Disposition: A | Discharge status: Home / Self Care | Payer: PPO | Source: Ambulatory Visit | Attending: Gerontology | Admitting: Gerontology

## 2017-03-25 ENCOUNTER — Encounter: Payer: Self-pay | Admitting: Podiatry

## 2017-03-25 DIAGNOSIS — Z4781 Encounter for orthopedic aftercare following surgical amputation: Secondary | ICD-10-CM | POA: Insufficient documentation

## 2017-03-25 LAB — CBC WITH DIFFERENTIAL/PLATELET
BAND NEUTROPHILS: 3 %
BASOS ABS: 0 10*3/uL (ref 0–0.1)
BASOS PCT: 0 %
Blasts: 0 %
EOS ABS: 0.2 10*3/uL (ref 0–0.7)
Eosinophils Relative: 2 %
HCT: 32.2 % — ABNORMAL LOW (ref 35.0–47.0)
Hemoglobin: 9.9 g/dL — ABNORMAL LOW (ref 12.0–16.0)
LYMPHS PCT: 18 %
Lymphs Abs: 2.1 10*3/uL (ref 1.0–3.6)
MCH: 23.8 pg — AB (ref 26.0–34.0)
MCHC: 30.8 g/dL — ABNORMAL LOW (ref 32.0–36.0)
MCV: 77.4 fL — ABNORMAL LOW (ref 80.0–100.0)
METAMYELOCYTES PCT: 0 %
MONO ABS: 1.3 10*3/uL — AB (ref 0.2–0.9)
MONOS PCT: 11 %
Myelocytes: 1 %
NEUTROS ABS: 7.9 10*3/uL — AB (ref 1.4–6.5)
Neutrophils Relative %: 65 %
OTHER: 0 %
Platelets: 426 10*3/uL (ref 150–440)
Promyelocytes Absolute: 0 %
RBC: 4.16 MIL/uL (ref 3.80–5.20)
RDW: 23.7 % — ABNORMAL HIGH (ref 11.5–14.5)
Smear Review: ADEQUATE
WBC: 11.5 10*3/uL — ABNORMAL HIGH (ref 3.6–11.0)
nRBC: 0 /100 WBC

## 2017-03-25 LAB — COMPREHENSIVE METABOLIC PANEL
ALBUMIN: 2.6 g/dL — AB (ref 3.5–5.0)
ALK PHOS: 66 U/L (ref 38–126)
ALT: 12 U/L — ABNORMAL LOW (ref 14–54)
AST: 22 U/L (ref 15–41)
Anion gap: 12 (ref 5–15)
BUN: 37 mg/dL — ABNORMAL HIGH (ref 6–20)
CALCIUM: 8.3 mg/dL — AB (ref 8.9–10.3)
CHLORIDE: 98 mmol/L — AB (ref 101–111)
CO2: 25 mmol/L (ref 22–32)
Creatinine, Ser: 1.31 mg/dL — ABNORMAL HIGH (ref 0.44–1.00)
GFR calc non Af Amer: 37 mL/min — ABNORMAL LOW (ref >60)
GFR, EST AFRICAN AMERICAN: 43 mL/min — AB (ref >60)
Glucose, Bld: 145 mg/dL — ABNORMAL HIGH (ref 65–99)
Potassium: 3.1 mmol/L — ABNORMAL LOW (ref 3.5–5.1)
SODIUM: 135 mmol/L (ref 135–145)
Total Bilirubin: 0.6 mg/dL (ref 0.3–1.2)
Total Protein: 5.8 g/dL — ABNORMAL LOW (ref 6.5–8.1)

## 2017-03-25 LAB — AEROBIC/ANAEROBIC CULTURE W GRAM STAIN (SURGICAL/DEEP WOUND)

## 2017-03-25 LAB — AEROBIC/ANAEROBIC CULTURE (SURGICAL/DEEP WOUND): GRAM STAIN: NONE SEEN

## 2017-03-25 LAB — SURGICAL PATHOLOGY

## 2017-03-26 ENCOUNTER — Other Ambulatory Visit
Admission: RE | Admit: 2017-03-26 | Discharge: 2017-03-26 | Disposition: A | Discharge status: Home / Self Care | Payer: PPO | Source: Ambulatory Visit | Attending: Gerontology | Admitting: Gerontology

## 2017-03-26 DIAGNOSIS — Z4781 Encounter for orthopedic aftercare following surgical amputation: Secondary | ICD-10-CM | POA: Insufficient documentation

## 2017-03-26 LAB — BASIC METABOLIC PANEL
ANION GAP: 11 (ref 5–15)
BUN: 35 mg/dL — AB (ref 6–20)
CO2: 21 mmol/L — ABNORMAL LOW (ref 22–32)
Calcium: 8.2 mg/dL — ABNORMAL LOW (ref 8.9–10.3)
Chloride: 103 mmol/L (ref 101–111)
Creatinine, Ser: 1.39 mg/dL — ABNORMAL HIGH (ref 0.44–1.00)
GFR, EST AFRICAN AMERICAN: 40 mL/min — AB (ref >60)
GFR, EST NON AFRICAN AMERICAN: 35 mL/min — AB (ref >60)
Glucose, Bld: 178 mg/dL — ABNORMAL HIGH (ref 65–99)
POTASSIUM: 3.7 mmol/L (ref 3.5–5.1)
SODIUM: 135 mmol/L (ref 135–145)

## 2017-03-27 ENCOUNTER — Other Ambulatory Visit
Admission: RE | Admit: 2017-03-27 | Discharge: 2017-03-27 | Disposition: A | Discharge status: Home / Self Care | Payer: PPO | Source: Ambulatory Visit | Attending: Gerontology | Admitting: Gerontology

## 2017-03-27 DIAGNOSIS — R197 Diarrhea, unspecified: Secondary | ICD-10-CM | POA: Insufficient documentation

## 2017-03-28 ENCOUNTER — Other Ambulatory Visit
Admission: RE | Admit: 2017-03-28 | Discharge: 2017-03-28 | Disposition: A | Discharge status: Home / Self Care | Payer: PPO | Source: Other Acute Inpatient Hospital | Attending: Gerontology | Admitting: Gerontology

## 2017-03-28 DIAGNOSIS — R197 Diarrhea, unspecified: Secondary | ICD-10-CM | POA: Insufficient documentation

## 2017-03-28 LAB — C DIFFICILE QUICK SCREEN W PCR REFLEX
C DIFFICILE (CDIFF) TOXIN: NEGATIVE
C DIFFICILE (CDIFF) TOXIN: NEGATIVE
C DIFFICLE (CDIFF) ANTIGEN: NEGATIVE
C Diff antigen: NEGATIVE
C Diff interpretation: NOT DETECTED
C Diff interpretation: NOT DETECTED

## 2017-04-07 ENCOUNTER — Non-Acute Institutional Stay (SKILLED_NURSING_FACILITY): Payer: PPO | Admitting: Gerontology

## 2017-04-07 ENCOUNTER — Other Ambulatory Visit
Admission: RE | Admit: 2017-04-07 | Discharge: 2017-04-07 | Disposition: A | Discharge status: Home / Self Care | Payer: PPO | Source: Skilled Nursing Facility | Attending: Gerontology | Admitting: Gerontology

## 2017-04-07 DIAGNOSIS — E11628 Type 2 diabetes mellitus with other skin complications: Secondary | ICD-10-CM | POA: Diagnosis not present

## 2017-04-07 DIAGNOSIS — L089 Local infection of the skin and subcutaneous tissue, unspecified: Secondary | ICD-10-CM

## 2017-04-07 DIAGNOSIS — E1151 Type 2 diabetes mellitus with diabetic peripheral angiopathy without gangrene: Secondary | ICD-10-CM | POA: Insufficient documentation

## 2017-04-07 DIAGNOSIS — E1142 Type 2 diabetes mellitus with diabetic polyneuropathy: Secondary | ICD-10-CM

## 2017-04-07 DIAGNOSIS — E1140 Type 2 diabetes mellitus with diabetic neuropathy, unspecified: Secondary | ICD-10-CM | POA: Insufficient documentation

## 2017-04-07 LAB — CBC WITH DIFFERENTIAL/PLATELET
Basophils Absolute: 0.1 10*3/uL (ref 0–0.1)
Basophils Relative: 2 %
Eosinophils Absolute: 0.6 10*3/uL (ref 0–0.7)
Eosinophils Relative: 7 %
HEMATOCRIT: 33.4 % — AB (ref 35.0–47.0)
HEMOGLOBIN: 10.7 g/dL — AB (ref 12.0–16.0)
LYMPHS PCT: 15 %
Lymphs Abs: 1.2 10*3/uL (ref 1.0–3.6)
MCH: 25.3 pg — ABNORMAL LOW (ref 26.0–34.0)
MCHC: 31.9 g/dL — AB (ref 32.0–36.0)
MCV: 79.3 fL — ABNORMAL LOW (ref 80.0–100.0)
MONOS PCT: 7 %
Monocytes Absolute: 0.6 10*3/uL (ref 0.2–0.9)
NEUTROS ABS: 5.8 10*3/uL (ref 1.4–6.5)
NEUTROS PCT: 69 %
Platelets: 212 10*3/uL (ref 150–440)
RBC: 4.21 MIL/uL (ref 3.80–5.20)
RDW: 24.5 % — ABNORMAL HIGH (ref 11.5–14.5)
WBC: 8.3 10*3/uL (ref 3.6–11.0)

## 2017-04-07 LAB — COMPREHENSIVE METABOLIC PANEL
ALK PHOS: 135 U/L — AB (ref 38–126)
ALT: 24 U/L (ref 14–54)
ANION GAP: 11 (ref 5–15)
AST: 37 U/L (ref 15–41)
Albumin: 3 g/dL — ABNORMAL LOW (ref 3.5–5.0)
BILIRUBIN TOTAL: 0.4 mg/dL (ref 0.3–1.2)
BUN: 14 mg/dL (ref 6–20)
CALCIUM: 8.8 mg/dL — AB (ref 8.9–10.3)
CO2: 24 mmol/L (ref 22–32)
CREATININE: 1.08 mg/dL — AB (ref 0.44–1.00)
Chloride: 99 mmol/L — ABNORMAL LOW (ref 101–111)
GFR calc non Af Amer: 47 mL/min — ABNORMAL LOW (ref >60)
GFR, EST AFRICAN AMERICAN: 54 mL/min — AB (ref >60)
GLUCOSE: 201 mg/dL — AB (ref 65–99)
Potassium: 4.1 mmol/L (ref 3.5–5.1)
Sodium: 134 mmol/L — ABNORMAL LOW (ref 135–145)
TOTAL PROTEIN: 6.6 g/dL (ref 6.5–8.1)

## 2017-04-07 NOTE — Progress Notes (Signed)
Location:      Place of Service:  Nursing [phone]) Provider:  Toni Arthurs, NP-C  Rusty Aus, MD  Patient Care Team: Rusty Aus, MD as PCP - General (Internal Medicine) Clent Jacks, RN as Registered Nurse  Extended Emergency Contact Information Primary Emergency Contact: Grenier,Marshall Address: [address]          [place], [place] [address] Johnnette Litter of [place] Phone: [phone] Work Phone: [phone] Mobile Phone: [phone] Relation: Son Secondary Emergency Contact: Pore,Michael Address: [address]          [place], [place] [address] Johnnette Litter of [place] Phone: [phone] Mobile Phone: [phone] Relation: Son  Code Status: Full Goals of care: Advanced Directive information Advanced Directives 03/21/2017  Does Patient Have a Medical Advance Directive? Yes  Type of Advance Directive Out of facility DNR (pink MOST or yellow form)  Does patient want to make changes to medical advance directive? No - Patient declined  Copy of [place] in Chart? -  Would patient like information on creating a medical advance directive? -     Chief Complaint  Patient presents with  . Acute Visit    foot pain    HPI:  Pt is a 84 y.o. female seen today for an acute visit for foot pain.  Ms. Fujii was admitted to the facility for rehab following hospitalization for sepsis due to diabetic foot wound infection of the right foot with toe amputation.  Patient is on 4 times daily IV antibiotics and dressing changes q. 4 days per podiatry recommendations.  Patient is ambulatory with assistance using a cast shoe on the right foot.  Patient reports pain in the right foot is 0 when at rest.  However, patient reports a sudden increase in pain in the left foot that started last night.  Patient describes pain as burning with severe tingling.  Pain is centered across the forefoot at the tarsal/metatarsal junction.  Toes and forefoot are very sensitive  and painful to light touch.  Patient reports the burning and tingling is present through the entire foot, mostly on the dorsal aspect of the foot up to the ankle joint.  There is a 2-3 cm diameter callus on the plantar surface of the first metatarsal.  Patient has been using a "doughnut" cushion on this area.  There is a hard callused area on the tip of the second toe, beneath the toenail.  This callus is dark colored and is causing the toenail to turn black.  This area is also very painful to the patient.  The shoes that the patient typically wears are well fitting/not restrictive to the toes.  Pedal pulse is 2+ in the left foot.  Foot is warm and cap refills are WNL.  No signs of cellulitis, infection, vascular compromise.  Patient reports the pain in the left foot is an 8-10 out of 10 at times.  Patient is afebrile.  Vital signs stable.  No other complaints.    Past Medical History:  Diagnosis Date  . Anemia   . Atrial fibrillation ([place])   . Cardiac arrest ([place])   . Cataract   . CHF (congestive heart failure) ([place])   . Diabetes mellitus without complication ([place])   . Edema    feet/ankles occas  . Hip fracture ([place])   . Hyperlipidemia   . Hypertension   . Hypothyroid   . Neuropathy   . Osteomyelitis ([place])    left first metatarsal  .  Stroke [facility])    Past Surgical History:  Procedure Laterality Date  . ACHILLES TENDON SURGERY Left 02/08/2015   Procedure: ACHILLES LENGTHENING/KIDNER;  Surgeon: Albertine Patricia, DPM;  Location: [facility] ORS;  Service: Podiatry;  Laterality: Left;  . APPENDECTOMY    . CARDIAC CATHETERIZATION  08/25/13  . CATARACT EXTRACTION W/PHACO Left 07/20/2014   Procedure: CATARACT EXTRACTION PHACO AND INTRAOCULAR LENS PLACEMENT (IOC);  Surgeon: Leandrew Koyanagi, MD;  Location: [facility] ORS;  Service: Ophthalmology;  Laterality: Left;  [place]  1:18                 AP     23.6             CDE   9.69      lot #[id number]  . CHOLECYSTECTOMY    . CORONARY ANGIOPLASTY    . HALLUX  VALGUS AKIN Left 02/08/2015   Procedure: HALLUX VALGUS AKIN/ KELLER;  Surgeon: Albertine Patricia, DPM;  Location: [facility] ORS;  Service: Podiatry;  Laterality: Left;  IVA with Local needs 1 hour for this case   . HEMIARTHROPLASTY HIP Right   . HEMIARTHROPLASTY HIP Left   . INCISION AND DRAINAGE Left 10/11/2014   Procedure: Removal of infected tibial sessmoid;  Surgeon: Albertine Patricia, DPM;  Location: [facility] ORS;  Service: Podiatry;  Laterality: Left;  . IRRIGATION AND DEBRIDEMENT FOOT Right 03/21/2017   Procedure: IRRIGATION AND DEBRIDEMENT FOOT right great toe amputation;  Surgeon: Sharlotte Alamo, DPM;  Location: [facility] ORS;  Service: Podiatry;  Laterality: Right;    Allergies  Allergen Reactions  . Carbamazepine Other (See Comments)    Reaction: unknown  . Codeine Other (See Comments)    Other reaction(s): Other (See Comments) Patient states she did not feel herself when she took it. unknown  . Cymbalta [Duloxetine Hcl] Other (See Comments)    Confusion, disorientation  . Duloxetine     Other reaction(s): Other (See Comments) Confusion, disorientation  . Lyrica [Pregabalin] Other (See Comments)    Patient and son states this makes the patient confused.    Allergies as of 04/07/2017      Reactions   Carbamazepine Other (See Comments)   Reaction: unknown   Codeine Other (See Comments)   Other reaction(s): Other (See Comments) Patient states she did not feel herself when she took it. unknown   Cymbalta [duloxetine Hcl] Other (See Comments)   Confusion, disorientation   Duloxetine    Other reaction(s): Other (See Comments) Confusion, disorientation   Lyrica [pregabalin] Other (See Comments)   Patient and son states this makes the patient confused.      Medication List        Accurate as of 04/07/17 11:49 AM. Always use your most recent med list.          acetaminophen 500 MG tablet Commonly known as:  TYLENOL Take 500 mg by mouth every 4 (four) hours as needed for mild pain or  moderate pain.   amiodarone 200 MG tablet Commonly known as:  PACERONE Take 1 tablet (200 mg total) by mouth daily.   aspirin 81 MG tablet Take 81 mg by mouth daily.   bisacodyl 5 MG EC tablet Commonly known as:  DULCOLAX Take 2 tablets (10 mg total) by mouth every other day.   cholecalciferol 1000 units tablet Commonly known as:  VITAMIN D Take 1,000 Units by mouth daily.   feeding supplement (PRO-STAT SUGAR FREE 64) Liqd Take 30 mLs by mouth 2 (two) times daily between meals.   ferrous  sulfate 325 (65 FE) MG tablet Take 325 mg by mouth 2 (two) times daily.   furosemide 20 MG tablet Commonly known as:  LASIX Take 1 tablet (20 mg total) by mouth daily.   gabapentin 600 MG tablet Commonly known as:  NEURONTIN Take 600 mg by mouth 4 (four) times daily.   glimepiride 2 MG tablet Commonly known as:  AMARYL Take 2 mg by mouth daily with breakfast.   GLUCERNA Liqd Take 237 mLs by mouth 2 (two) times daily between meals.   levothyroxine 112 MCG tablet Commonly known as:  SYNTHROID, LEVOTHROID Take 112 mcg by mouth daily before breakfast. 30 minutes before breakfast   metoprolol tartrate 25 MG tablet Commonly known as:  LOPRESSOR Take 1 tablet (25 mg total) by mouth 2 (two) times daily.   oxyCODONE 5 MG immediate release tablet Commonly known as:  Oxy IR/ROXICODONE Take 1 tablet (5 mg total) by mouth every 4 (four) hours as needed for moderate pain or severe pain.   potassium chloride 10 MEQ CR capsule Commonly known as:  MICRO-K Take 10 mEq by mouth daily.   PRESERVISION AREDS 2+MULTI VIT PO Take 1 capsule by mouth 2 (two) times daily.   senna-docusate 8.6-50 MG tablet Commonly known as:  Senokot-S Take 1 tablet by mouth 2 (two) times daily.   simvastatin 20 MG tablet Commonly known as:  ZOCOR Take 20 mg by mouth daily.   vitamin C 500 MG tablet Commonly known as:  ASCORBIC ACID Take 500 mg by mouth 2 (two) times daily.       Review of Systems    Constitutional: Negative for activity change, appetite change, chills, diaphoresis and fever.  HENT: Negative for congestion, mouth sores, nosebleeds, postnasal drip, sneezing, sore throat, trouble swallowing and voice change.   Respiratory: Negative for apnea, cough, choking, chest tightness, shortness of breath and wheezing.   Cardiovascular: Negative for chest pain, palpitations and leg swelling.  Gastrointestinal: Negative for abdominal distention, abdominal pain, constipation, diarrhea and nausea.  Genitourinary: Negative for difficulty urinating, dysuria, frequency and urgency.  Musculoskeletal: Positive for arthralgias (typical arthritis), gait problem and myalgias. Negative for back pain.  Skin: Positive for wound. Negative for color change, pallor and rash.  Neurological: Positive for weakness. Negative for dizziness, tremors, syncope, speech difficulty, numbness and headaches.  Psychiatric/Behavioral: Negative for agitation and behavioral problems.  All other systems reviewed and are negative.    There is no immunization history on file for this patient. Pertinent  Health Maintenance Due  Topic Date Due  . OPHTHALMOLOGY EXAM  06/17/1943  . URINE MICROALBUMIN  06/17/1943  . DEXA SCAN  06/17/2000  . PNA vac Low Risk Adult (1 of 2 - PCV13) 06/17/2000  . FOOT EXAM  10/10/2015  . INFLUENZA VACCINE  08/13/2016  . HEMOGLOBIN A1C  09/21/2017   Fall Risk  02/17/2017  Falls in the past year? No  Risk for fall due to : Impaired balance/gait   Functional Status Survey:    Vitals:   04/07/17 0730  BP: (!) 156/51  Pulse: (!) 48  Temp: 97.6 F (36.4 C)  SpO2: 98%   There is no height or weight on file to calculate BMI. Physical Exam  Constitutional: She is oriented to person, place, and time. Vital signs are normal. She appears well-developed and well-nourished. She is active and cooperative. She does not appear ill. No distress.  HENT:  Head: Normocephalic and atraumatic.   Mouth/Throat: Uvula is midline, oropharynx is clear and moist and  mucous membranes are normal. Mucous membranes are not pale, not dry and not cyanotic.  Eyes: Pupils are equal, round, and reactive to light. Conjunctivae, EOM and lids are normal.  Neck: Trachea normal, normal range of motion and full passive range of motion without pain. Neck supple. No JVD present. No tracheal deviation, no edema and no erythema present. No thyromegaly present.  Cardiovascular: Normal rate, regular rhythm, normal heart sounds, intact distal pulses and normal pulses. Exam reveals no gallop, no distant heart sounds and no friction rub.  No murmur heard. Pulses:      Dorsalis pedis pulses are 2+ on the left side.  No edema; unable to assess right pedal pulse due to dressing  Pulmonary/Chest: Effort normal and breath sounds normal. No accessory muscle usage. No respiratory distress. She has no decreased breath sounds. She has no wheezes. She has no rhonchi. She has no rales. She exhibits no tenderness.  Abdominal: Soft. Normal appearance and bowel sounds are normal. She exhibits no distension and no ascites. There is no tenderness.  Musculoskeletal: Normal range of motion. She exhibits no edema.       Right foot: There is laceration.       Left foot: There is tenderness. There is normal range of motion, no bony tenderness, no swelling, normal capillary refill, no crepitus, no deformity and no laceration.       Feet:  Expected osteoarthritis, stiffness; Bilateral Calves soft, supple. Negative Homan's Sign. B- pedal pulses equal  Neurological: She is alert and oriented to person, place, and time. She has normal strength. A sensory deficit is present. Gait abnormal.  Skin: Skin is warm, dry and intact. She is not diaphoretic. No cyanosis. No pallor. Nails show no clubbing.  Psychiatric: She has a normal mood and affect. Her speech is normal and behavior is normal. Judgment and thought content normal. Cognition and  memory are normal.  Nursing note and vitals reviewed.   Labs reviewed: Recent Labs    03/23/17 0355 03/25/17 0430 03/26/17 0535  NA 133* 135 135  K 3.4* 3.1* 3.7  CL 96* 98* 103  CO2 23 25 21*  GLUCOSE 175* 145* 178*  BUN 33* 37* 35*  CREATININE 1.22* 1.31* 1.39*  CALCIUM 8.7* 8.3* 8.2*   Recent Labs    01/29/17 1645 03/19/17 2018 03/25/17 0430  AST 29 33 22  ALT 12* 14 12*  ALKPHOS 93 76 66  BILITOT 0.5 0.7 0.6  PROT 7.9 6.6 5.8*  ALBUMIN 4.2 2.7* 2.6*   Recent Labs    03/19/17 2018 03/20/17 0234 03/22/17 0707 03/25/17 0430  WBC 17.4* 20.0* 23.3* 11.5*  NEUTROABS 15.7*  --  21.2* 7.9*  HGB 9.7* 8.5* 10.5* 9.9*  HCT 30.5* 27.4* 33.7* 32.2*  MCV 75.8* 77.7* 76.5* 77.4*  PLT 277 274 377 426   Lab Results  Component Value Date   TSH 0.86 10/19/2014   Lab Results  Component Value Date   HGBA1C 7.1 (H) 03/21/2017   Lab Results  Component Value Date   CHOL 133 12/26/2013   HDL 36 (L) 12/26/2013   LDLCALC 28 12/26/2013   TRIG 345 (H) 12/26/2013    Significant Diagnostic Results in last 30 days:  Dg Chest 1 View  Result Date: 03/19/2017 CLINICAL DATA:  Short of breath EXAM: CHEST 1 VIEW COMPARISON:  10/15/2014 FINDINGS: Mild biapical scarring. No focal pulmonary opacity or pleural effusion. Stable cardiomediastinal silhouette with aortic atherosclerosis. No pneumothorax. Old right second rib fracture. IMPRESSION: No active disease. Electronically  Signed   By: Donavan Foil M.D.   On: 03/19/2017 19:59   Dg Ankle Complete Right  Result Date: 03/19/2017 CLINICAL DATA:  Erythema. Evaluate for possible osteomyelitis. Personal history of left foot osteomyelitis in 2016. EXAM: RIGHT ANKLE - COMPLETE 3+ VIEW COMPARISON:  None. FINDINGS: Mild diffuse soft tissue swelling. Osseous demineralization. No evidence of acute fracture or dislocation. Ankle mortise intact with mild joint space narrowing. No evidence of osteomyelitis. IMPRESSION: 1. No acute osseous abnormality.   No evidence of osteomyelitis. 2. Osseous demineralization. 3. Mild osteoarthritis. Electronically Signed   By: Evangeline Dakin M.D.   On: 03/19/2017 21:21   Ct Head Wo Contrast  Result Date: 03/19/2017 CLINICAL DATA:  84 year old with progressively worsening confusion over the past 2 days. Patient fell yesterday. Initial encounter. EXAM: CT HEAD WITHOUT CONTRAST TECHNIQUE: Contiguous axial images were obtained from the base of the skull through the vertex without intravenous contrast. COMPARISON:  12/02/2014, 11/05/2014. FINDINGS: Brain: Ventricular system normal in size and appearance for age. Mild age related cortical atrophy, unchanged. Moderate changes of small vessel disease of the white matter diffusely, unchanged. No mass lesion. No midline shift. No acute hemorrhage or hematoma. No extra-axial fluid collections. No evidence of acute infarction. Vascular: Mild bilateral carotid siphon and vertebral artery atherosclerosis. No hyperdense vessel. Skull: Marked hyperostosis frontalis interna bilaterally. No skull fracture or other focal osseous abnormality involving the skull. Sinuses/Orbits: Visualized paranasal sinuses, bilateral mastoid air cells and bilateral middle ear cavities well-aerated. Visualized orbits and globes intact. Hyperdense lens involving the right globe, query cataract. Other: None. IMPRESSION: 1. No acute intracranial abnormality. 2. Stable mild age related cortical atrophy and stable moderate chronic microvascular ischemic changes of the white matter. Electronically Signed   By: Evangeline Dakin M.D.   On: 03/19/2017 20:11   Mr Foot Right Wo Contrast  Result Date: 03/20/2017 CLINICAL DATA:  Diabetic with foot ulcerations noted increasing erythema and swelling with drainage. EXAM: MRI OF THE RIGHT FOREFOOT WITHOUT CONTRAST TECHNIQUE: Multiplanar, multisequence MR imaging of the right forefoot was performed. No intravenous contrast was administered. COMPARISON:  None. FINDINGS:  Bones/Joint/Cartilage [place] edema of the great toe is identified involving the distal phalanx and head of the first proximal phalanx, series 7, image 24 suspicious for changes of acute osteomyelitis. Additionally there is intraosseous elevated T2 signal within the distal first metatarsal associated with a cortical defect along the plantar aspect suspicious for a cloaca from chronic osteomyelitis. Callus is noted about a healing distal third metatarsal fracture involving the neck. Small focus of T2 hyperintensities seen that could represent a small posttraumatic cyst given its wall circumscribed margins. Ligaments No focal abnormality Muscles and Tendons No intramuscular mass or fluid collection. Focal abnormality of the extensor and flexor tendons crossing the forefoot. Soft tissues Plantar soft tissue ulceration of the forefoot adjacent to the first MTP articulation. Diffuse soft tissue edema of the included forefoot consistent with cellulitis and/or third spacing of fluid. IMPRESSION: 1. Diffuse soft tissue swelling of the included forefoot without focal drainable fluid collection or abscess identified. 2. Marrow abnormality of the distal phalanx and head of first proximal phalanx of the great toe associated with soft tissue ulceration along the plantar aspect the forefoot at the level of the first metatarsal head. Findings are concerning for changes of acute osteomyelitis. 3. Possible cloaca involving the distal first metatarsal with intraosseous elevated T2 signal suspicious for changes of chronic osteomyelitis. 4. Healing third metatarsal neck fracture with small focus of T2 hyperintensity more  likely related to posttraumatic degenerative cyst though the possibility of chronic osteomyelitis is not completely excluded as well. Electronically Signed   By: Ashley Royalty M.D.   On: 03/20/2017 22:53   Dg Chest Port 1 View  Result Date: 03/23/2017 CLINICAL DATA:  84 year old female admitted with sepsis and right  foot osteomyelitis, postoperative day 2 status post amputation of the right foot 1st ray and midfoot debridement. EXAM: PORTABLE CHEST 1 VIEW COMPARISON:  03/19/2017 and earlier. FINDINGS: Portable AP upright view at 0700 hours. Stable lung volumes. Stable cardiac size and mediastinal contours. Visualized tracheal air column is within normal limits. No pneumothorax. No pulmonary edema or pleural effusion. Mildly increased left greater than right upper lung interstitial opacity. No other confluent opacity. Calcified aortic atherosclerosis. IMPRESSION: 1. Mild asymmetric and increased upper lung interstitial opacity since 03/19/2017. Consider developing bilateral respiratory infection. 2. No pleural effusion or other acute cardiopulmonary abnormality. Electronically Signed   By: Genevie Ann M.D.   On: 03/23/2017 07:56   Dg Foot Complete Left  Result Date: 03/19/2017 CLINICAL DATA:  Erythema. Evaluate for possible osteomyelitis. Personal history of left foot osteomyelitis in 2016. EXAM: LEFT FOOT - COMPLETE 3+ VIEW COMPARISON:  Left foot MRI 10/10/2014 left foot x-rays 02/23/2012. FINDINGS: No evidence of acute fracture or dislocation. Osseous demineralization. Hammertoe deformity. Likely remote healed fracture involving the proximal phalanx of the second toe. No evidence of osteomyelitis. Narrowing of the IP joint spaces of the toes and narrowing of joint spaces in the midfoot, unchanged. Small plantar calcaneal spur. Small enthesopathic spur at the insertion of the Achilles tendon on the posterior calcaneus. IMPRESSION: 1. No acute osseous abnormality.  No evidence of osteomyelitis. 2. Osseous demineralization. 3. Mild osteoarthritis. Electronically Signed   By: Evangeline Dakin M.D.   On: 03/19/2017 21:20   Dg Foot Complete Right  Result Date: 03/19/2017 CLINICAL DATA:  Erythema. Evaluate for possible osteomyelitis. Personal history of left foot osteomyelitis in 2016. EXAM: RIGHT FOOT COMPLETE - 3+ VIEW  COMPARISON:  None. FINDINGS: Soft tissue ulceration with gas in the tissues of the plantar surface underlying the head of the first metatarsal. No evidence of osteomyelitis. No evidence of acute fracture or dislocation. Osseous demineralization. Remote fracture involving the head of the third metatarsal with incomplete union. Hammertoe deformity. Joint space narrowing involving the IP joints of the toes and joints of the midfoot. IMPRESSION: 1. Soft tissue ulceration with gas in the tissues of the plantar surface of the foot underlying the head of the first metatarsal. No evidence of osteomyelitis. 2. Remote fracture involving the head of the third metatarsal with incomplete union. 3. Mild osteoarthritis. 4. Osseous demineralization. Electronically Signed   By: Evangeline Dakin M.D.   On: 03/19/2017 21:24   [place] Ekg Site Rite  Result Date: 03/23/2017 If Site Rite image not attached, placement could not be confirmed due to current cardiac rhythm.   Assessment/Plan Alicyn was seen today for acute visit.  Diagnoses and all orders for this visit:  Diabetic foot infection ([place])  Diabetic polyneuropathy associated with type 2 diabetes mellitus ([place])    Left foot and ankle x-rays, complete view  Labs: CBC, met C  Increase gabapentin to 700 mg p.o. 4 times daily  Amitriptyline 10 mg p.o. nightly  Scheduled Tylenol 500 mg p.o. 4 times daily  Continue oxycodone 5 mg p.o. every 4 hours as needed pain  Instructed nursing to notify Dr. Selina Cooley office to have him assess the left foot as well when she goes  for follow-up with the right foot  Continue daily padding to callus on dorsum of left foot  Continue Zosyn 3.375 mg IV every 6 hours  IV and skin care per protocol  Continue dressing change to the right foot as ordered by podiatry  Follow-up with Dr. Elvina Mattes as instructed  Family/ staff Communication:   Total Time:  Documentation:  Face to Face:  Family/Phone:   Labs/tests  ordered: Left ankle and foot x-ray, CBC, met C  Medication list reviewed and assessed for continued appropriateness.  Vikki Ports, NP-C Geriatrics [facility] [phone], [place] [address] Cell Phone (Mon-Fri 8am-5pm):  [phone] On Call:  [phone] & follow prompts after 5pm & weekends Office Phone:  [phone] Office Fax:  [phone]

## 2017-04-13 ENCOUNTER — Encounter
Admission: RE | Admit: 2017-04-13 | Discharge: 2017-04-13 | Disposition: A | Discharge status: Home / Self Care | Payer: PPO | Source: Ambulatory Visit | Attending: Internal Medicine | Admitting: Internal Medicine

## 2017-04-14 DIAGNOSIS — M86671 Other chronic osteomyelitis, right ankle and foot: Secondary | ICD-10-CM | POA: Diagnosis not present

## 2017-04-14 DIAGNOSIS — E1142 Type 2 diabetes mellitus with diabetic polyneuropathy: Secondary | ICD-10-CM | POA: Diagnosis not present

## 2017-04-14 DIAGNOSIS — L97509 Non-pressure chronic ulcer of other part of unspecified foot with unspecified severity: Secondary | ICD-10-CM | POA: Diagnosis not present

## 2017-04-14 DIAGNOSIS — E11621 Type 2 diabetes mellitus with foot ulcer: Secondary | ICD-10-CM | POA: Diagnosis not present

## 2017-04-14 DIAGNOSIS — L97513 Non-pressure chronic ulcer of other part of right foot with necrosis of muscle: Secondary | ICD-10-CM | POA: Diagnosis not present

## 2017-04-14 DIAGNOSIS — E1169 Type 2 diabetes mellitus with other specified complication: Secondary | ICD-10-CM | POA: Diagnosis not present

## 2017-04-14 DIAGNOSIS — S1190XD Unspecified open wound of unspecified part of neck, subsequent encounter: Secondary | ICD-10-CM | POA: Diagnosis not present

## 2017-04-14 DIAGNOSIS — M869 Osteomyelitis, unspecified: Secondary | ICD-10-CM | POA: Diagnosis not present

## 2017-04-14 DIAGNOSIS — Z792 Long term (current) use of antibiotics: Secondary | ICD-10-CM | POA: Diagnosis not present

## 2017-04-16 ENCOUNTER — Non-Acute Institutional Stay (SKILLED_NURSING_FACILITY): Payer: PPO | Admitting: Gerontology

## 2017-04-16 ENCOUNTER — Inpatient Hospital Stay
Admission: AD | Admit: 2017-04-16 | Discharge: 2017-04-21 | DRG: 908 | Disposition: A | Discharge status: Skilled Nursing Facility | Payer: PPO | Source: Ambulatory Visit | Attending: Internal Medicine | Admitting: Internal Medicine

## 2017-04-16 ENCOUNTER — Other Ambulatory Visit: Payer: Self-pay

## 2017-04-16 DIAGNOSIS — Z9861 Coronary angioplasty status: Secondary | ICD-10-CM

## 2017-04-16 DIAGNOSIS — L97513 Non-pressure chronic ulcer of other part of right foot with necrosis of muscle: Secondary | ICD-10-CM | POA: Diagnosis not present

## 2017-04-16 DIAGNOSIS — E1151 Type 2 diabetes mellitus with diabetic peripheral angiopathy without gangrene: Secondary | ICD-10-CM | POA: Diagnosis not present

## 2017-04-16 DIAGNOSIS — Z87891 Personal history of nicotine dependence: Secondary | ICD-10-CM

## 2017-04-16 DIAGNOSIS — Z8673 Personal history of transient ischemic attack (TIA), and cerebral infarction without residual deficits: Secondary | ICD-10-CM

## 2017-04-16 DIAGNOSIS — T8130XA Disruption of wound, unspecified, initial encounter: Principal | ICD-10-CM | POA: Diagnosis present

## 2017-04-16 DIAGNOSIS — Z7401 Bed confinement status: Secondary | ICD-10-CM | POA: Diagnosis not present

## 2017-04-16 DIAGNOSIS — E11628 Type 2 diabetes mellitus with other skin complications: Secondary | ICD-10-CM | POA: Diagnosis not present

## 2017-04-16 DIAGNOSIS — I7389 Other specified peripheral vascular diseases: Secondary | ICD-10-CM | POA: Diagnosis not present

## 2017-04-16 DIAGNOSIS — Z961 Presence of intraocular lens: Secondary | ICD-10-CM | POA: Diagnosis present

## 2017-04-16 DIAGNOSIS — I480 Paroxysmal atrial fibrillation: Secondary | ICD-10-CM | POA: Diagnosis not present

## 2017-04-16 DIAGNOSIS — I110 Hypertensive heart disease with heart failure: Secondary | ICD-10-CM | POA: Diagnosis present

## 2017-04-16 DIAGNOSIS — Z96643 Presence of artificial hip joint, bilateral: Secondary | ICD-10-CM | POA: Diagnosis present

## 2017-04-16 DIAGNOSIS — E1140 Type 2 diabetes mellitus with diabetic neuropathy, unspecified: Secondary | ICD-10-CM | POA: Diagnosis not present

## 2017-04-16 DIAGNOSIS — Z79890 Hormone replacement therapy (postmenopausal): Secondary | ICD-10-CM

## 2017-04-16 DIAGNOSIS — Z888 Allergy status to other drugs, medicaments and biological substances status: Secondary | ICD-10-CM

## 2017-04-16 DIAGNOSIS — I70212 Atherosclerosis of native arteries of extremities with intermittent claudication, left leg: Secondary | ICD-10-CM | POA: Diagnosis not present

## 2017-04-16 DIAGNOSIS — Z9049 Acquired absence of other specified parts of digestive tract: Secondary | ICD-10-CM

## 2017-04-16 DIAGNOSIS — N9089 Other specified noninflammatory disorders of vulva and perineum: Secondary | ICD-10-CM | POA: Diagnosis present

## 2017-04-16 DIAGNOSIS — E1159 Type 2 diabetes mellitus with other circulatory complications: Secondary | ICD-10-CM | POA: Diagnosis not present

## 2017-04-16 DIAGNOSIS — M7981 Nontraumatic hematoma of soft tissue: Secondary | ICD-10-CM | POA: Diagnosis not present

## 2017-04-16 DIAGNOSIS — L97519 Non-pressure chronic ulcer of other part of right foot with unspecified severity: Secondary | ICD-10-CM | POA: Diagnosis present

## 2017-04-16 DIAGNOSIS — Z9842 Cataract extraction status, left eye: Secondary | ICD-10-CM

## 2017-04-16 DIAGNOSIS — L089 Local infection of the skin and subcutaneous tissue, unspecified: Secondary | ICD-10-CM

## 2017-04-16 DIAGNOSIS — E1165 Type 2 diabetes mellitus with hyperglycemia: Secondary | ICD-10-CM | POA: Diagnosis not present

## 2017-04-16 DIAGNOSIS — E039 Hypothyroidism, unspecified: Secondary | ICD-10-CM | POA: Diagnosis present

## 2017-04-16 DIAGNOSIS — D649 Anemia, unspecified: Secondary | ICD-10-CM

## 2017-04-16 DIAGNOSIS — M86671 Other chronic osteomyelitis, right ankle and foot: Secondary | ICD-10-CM | POA: Diagnosis present

## 2017-04-16 DIAGNOSIS — N390 Urinary tract infection, site not specified: Secondary | ICD-10-CM | POA: Diagnosis not present

## 2017-04-16 DIAGNOSIS — E11622 Type 2 diabetes mellitus with other skin ulcer: Secondary | ICD-10-CM | POA: Diagnosis not present

## 2017-04-16 DIAGNOSIS — E1142 Type 2 diabetes mellitus with diabetic polyneuropathy: Secondary | ICD-10-CM

## 2017-04-16 DIAGNOSIS — K7689 Other specified diseases of liver: Secondary | ICD-10-CM | POA: Diagnosis not present

## 2017-04-16 DIAGNOSIS — E785 Hyperlipidemia, unspecified: Secondary | ICD-10-CM | POA: Diagnosis not present

## 2017-04-16 DIAGNOSIS — I70238 Atherosclerosis of native arteries of right leg with ulceration of other part of lower right leg: Secondary | ICD-10-CM | POA: Diagnosis not present

## 2017-04-16 DIAGNOSIS — L98499 Non-pressure chronic ulcer of skin of other sites with unspecified severity: Secondary | ICD-10-CM | POA: Diagnosis present

## 2017-04-16 DIAGNOSIS — I5032 Chronic diastolic (congestive) heart failure: Secondary | ICD-10-CM | POA: Diagnosis not present

## 2017-04-16 DIAGNOSIS — F339 Major depressive disorder, recurrent, unspecified: Secondary | ICD-10-CM | POA: Diagnosis not present

## 2017-04-16 DIAGNOSIS — E1169 Type 2 diabetes mellitus with other specified complication: Secondary | ICD-10-CM | POA: Diagnosis present

## 2017-04-16 DIAGNOSIS — Z8249 Family history of ischemic heart disease and other diseases of the circulatory system: Secondary | ICD-10-CM

## 2017-04-16 DIAGNOSIS — I739 Peripheral vascular disease, unspecified: Secondary | ICD-10-CM | POA: Diagnosis not present

## 2017-04-16 DIAGNOSIS — Z89411 Acquired absence of right great toe: Secondary | ICD-10-CM | POA: Diagnosis not present

## 2017-04-16 DIAGNOSIS — L97929 Non-pressure chronic ulcer of unspecified part of left lower leg with unspecified severity: Secondary | ICD-10-CM | POA: Diagnosis present

## 2017-04-16 DIAGNOSIS — Z95820 Peripheral vascular angioplasty status with implants and grafts: Secondary | ICD-10-CM | POA: Diagnosis not present

## 2017-04-16 DIAGNOSIS — M86071 Acute hematogenous osteomyelitis, right ankle and foot: Secondary | ICD-10-CM | POA: Diagnosis not present

## 2017-04-16 DIAGNOSIS — E1152 Type 2 diabetes mellitus with diabetic peripheral angiopathy with gangrene: Secondary | ICD-10-CM | POA: Diagnosis not present

## 2017-04-16 DIAGNOSIS — I4891 Unspecified atrial fibrillation: Secondary | ICD-10-CM | POA: Diagnosis not present

## 2017-04-16 DIAGNOSIS — I509 Heart failure, unspecified: Secondary | ICD-10-CM | POA: Diagnosis not present

## 2017-04-16 DIAGNOSIS — B9562 Methicillin resistant Staphylococcus aureus infection as the cause of diseases classified elsewhere: Secondary | ICD-10-CM | POA: Diagnosis not present

## 2017-04-16 DIAGNOSIS — E11621 Type 2 diabetes mellitus with foot ulcer: Secondary | ICD-10-CM | POA: Diagnosis present

## 2017-04-16 DIAGNOSIS — E119 Type 2 diabetes mellitus without complications: Secondary | ICD-10-CM | POA: Diagnosis not present

## 2017-04-16 DIAGNOSIS — N183 Chronic kidney disease, stage 3 (moderate): Secondary | ICD-10-CM | POA: Diagnosis not present

## 2017-04-16 DIAGNOSIS — M6281 Muscle weakness (generalized): Secondary | ICD-10-CM | POA: Diagnosis not present

## 2017-04-16 DIAGNOSIS — I10 Essential (primary) hypertension: Secondary | ICD-10-CM | POA: Diagnosis not present

## 2017-04-16 DIAGNOSIS — R2689 Other abnormalities of gait and mobility: Secondary | ICD-10-CM | POA: Diagnosis not present

## 2017-04-16 DIAGNOSIS — G4700 Insomnia, unspecified: Secondary | ICD-10-CM | POA: Diagnosis not present

## 2017-04-16 DIAGNOSIS — Z8674 Personal history of sudden cardiac arrest: Secondary | ICD-10-CM | POA: Diagnosis not present

## 2017-04-16 DIAGNOSIS — Z7982 Long term (current) use of aspirin: Secondary | ICD-10-CM

## 2017-04-16 LAB — BASIC METABOLIC PANEL
Anion gap: 6 (ref 5–15)
BUN: 31 mg/dL — AB (ref 6–20)
CALCIUM: 9 mg/dL (ref 8.9–10.3)
CHLORIDE: 105 mmol/L (ref 101–111)
CO2: 26 mmol/L (ref 22–32)
CREATININE: 1.24 mg/dL — AB (ref 0.44–1.00)
GFR calc non Af Amer: 40 mL/min — ABNORMAL LOW (ref >60)
GFR, EST AFRICAN AMERICAN: 46 mL/min — AB (ref >60)
Glucose, Bld: 140 mg/dL — ABNORMAL HIGH (ref 65–99)
Potassium: 4.3 mmol/L (ref 3.5–5.1)
SODIUM: 137 mmol/L (ref 135–145)

## 2017-04-16 LAB — GLUCOSE, CAPILLARY
GLUCOSE-CAPILLARY: 121 mg/dL — AB (ref 65–99)
Glucose-Capillary: 162 mg/dL — ABNORMAL HIGH (ref 65–99)

## 2017-04-16 LAB — CBC
HCT: 35.2 % (ref 35.0–47.0)
Hemoglobin: 11.2 g/dL — ABNORMAL LOW (ref 12.0–16.0)
MCH: 26 pg (ref 26.0–34.0)
MCHC: 31.9 g/dL — AB (ref 32.0–36.0)
MCV: 81.5 fL (ref 80.0–100.0)
Platelets: 231 10*3/uL (ref 150–440)
RBC: 4.32 MIL/uL (ref 3.80–5.20)
RDW: 23.8 % — AB (ref 11.5–14.5)
WBC: 6.5 10*3/uL (ref 3.6–11.0)

## 2017-04-16 LAB — SURGICAL PCR SCREEN
MRSA, PCR: NEGATIVE
Staphylococcus aureus: NEGATIVE

## 2017-04-16 MED ORDER — ONDANSETRON HCL 4 MG PO TABS: 4.0000 mg | ORAL_TABLET | Freq: Four times a day (QID) | ORAL | Status: DC | PRN

## 2017-04-16 MED ORDER — SENNOSIDES-DOCUSATE SODIUM 8.6-50 MG PO TABS
1.0000 | ORAL_TABLET | Freq: Two times a day (BID) | ORAL | Status: DC
  Administered 2017-04-16 – 2017-04-20 (×7): 1 via ORAL
  Filled 2017-04-16 (×8): qty 1

## 2017-04-16 MED ORDER — LEVOTHYROXINE SODIUM 112 MCG PO TABS
112.0000 ug | ORAL_TABLET | Freq: Every day | ORAL | Status: DC
  Administered 2017-04-19 – 2017-04-21 (×3): 112 ug via ORAL
  Filled 2017-04-16 (×5): qty 1

## 2017-04-16 MED ORDER — ENOXAPARIN SODIUM 40 MG/0.4ML ~~LOC~~ SOLN: 40.0000 mg | SUBCUTANEOUS | Status: DC

## 2017-04-16 MED ORDER — PIPERACILLIN-TAZOBACTAM 3.375 G IVPB
3.3750 g | Freq: Three times a day (TID) | INTRAVENOUS | Status: DC
  Administered 2017-04-16 – 2017-04-21 (×13): 3.375 g via INTRAVENOUS
  Filled 2017-04-16 (×12): qty 50

## 2017-04-16 MED ORDER — METOPROLOL TARTRATE 25 MG PO TABS
25.0000 mg | ORAL_TABLET | Freq: Two times a day (BID) | ORAL | Status: DC
  Administered 2017-04-16 – 2017-04-20 (×8): 25 mg via ORAL
  Filled 2017-04-16 (×10): qty 1

## 2017-04-16 MED ORDER — OCUVITE-LUTEIN PO CAPS
1.0000 | ORAL_CAPSULE | Freq: Two times a day (BID) | ORAL | Status: DC
  Administered 2017-04-16 – 2017-04-21 (×8): 1 via ORAL
  Filled 2017-04-16 (×11): qty 1

## 2017-04-16 MED ORDER — HYDRALAZINE HCL 20 MG/ML IJ SOLN
10.0000 mg | INTRAMUSCULAR | Status: DC | PRN
Start: 2017-04-16 — End: 2017-04-21
  Administered 2017-04-16: 10 mg via INTRAVENOUS
  Filled 2017-04-16: qty 1

## 2017-04-16 MED ORDER — MUPIROCIN 2 % EX OINT
1.0000 "application " | TOPICAL_OINTMENT | Freq: Two times a day (BID) | CUTANEOUS | Status: AC
  Administered 2017-04-16 – 2017-04-18 (×3): 1 via NASAL
  Filled 2017-04-16: qty 22

## 2017-04-16 MED ORDER — TRAMADOL HCL 50 MG PO TABS
50.0000 mg | ORAL_TABLET | Freq: Four times a day (QID) | ORAL | Status: DC | PRN
Start: 2017-04-16 — End: 2017-04-21
  Administered 2017-04-17 – 2017-04-21 (×5): 50 mg via ORAL
  Filled 2017-04-16 (×4): qty 1

## 2017-04-16 MED ORDER — ACETAMINOPHEN 650 MG RE SUPP: 650.0000 mg | Freq: Four times a day (QID) | RECTAL | Status: DC | PRN

## 2017-04-16 MED ORDER — VITAMIN D 1000 UNITS PO TABS
1000.0000 [IU] | ORAL_TABLET | Freq: Every day | ORAL | Status: DC
  Administered 2017-04-19 – 2017-04-21 (×3): 1000 [IU] via ORAL
  Filled 2017-04-16 (×3): qty 1

## 2017-04-16 MED ORDER — INSULIN ASPART 100 UNIT/ML ~~LOC~~ SOLN
0.0000 [IU] | Freq: Three times a day (TID) | SUBCUTANEOUS | Status: DC
  Administered 2017-04-16: 1 [IU] via SUBCUTANEOUS
  Administered 2017-04-17: 2 [IU] via SUBCUTANEOUS
  Administered 2017-04-18: 5 [IU] via SUBCUTANEOUS
  Administered 2017-04-18: 2 [IU] via SUBCUTANEOUS
  Administered 2017-04-19: 1 [IU] via SUBCUTANEOUS
  Administered 2017-04-19 – 2017-04-20 (×2): 2 [IU] via SUBCUTANEOUS
  Administered 2017-04-20: 3 [IU] via SUBCUTANEOUS
  Administered 2017-04-21: 1 [IU] via SUBCUTANEOUS
  Administered 2017-04-21: 2 [IU] via SUBCUTANEOUS
  Filled 2017-04-16 (×10): qty 1

## 2017-04-16 MED ORDER — FUROSEMIDE 20 MG PO TABS
20.0000 mg | ORAL_TABLET | Freq: Every day | ORAL | Status: DC
  Administered 2017-04-19 – 2017-04-21 (×3): 20 mg via ORAL
  Filled 2017-04-16 (×3): qty 1

## 2017-04-16 MED ORDER — VITAMIN C 500 MG PO TABS
500.0000 mg | ORAL_TABLET | Freq: Two times a day (BID) | ORAL | Status: DC
  Administered 2017-04-16 – 2017-04-21 (×8): 500 mg via ORAL
  Filled 2017-04-16 (×11): qty 1

## 2017-04-16 MED ORDER — LOSARTAN POTASSIUM 25 MG PO TABS
25.0000 mg | ORAL_TABLET | Freq: Two times a day (BID) | ORAL | Status: DC
  Administered 2017-04-16 – 2017-04-20 (×7): 25 mg via ORAL
  Filled 2017-04-16 (×9): qty 1

## 2017-04-16 MED ORDER — BISACODYL 5 MG PO TBEC
10.0000 mg | DELAYED_RELEASE_TABLET | ORAL | Status: DC
  Administered 2017-04-16 – 2017-04-20 (×2): 10 mg via ORAL
  Filled 2017-04-16 (×3): qty 2

## 2017-04-16 MED ORDER — ZOLPIDEM TARTRATE 5 MG PO TABS
5.0000 mg | ORAL_TABLET | Freq: Every evening | ORAL | Status: DC | PRN
Start: 2017-04-16 — End: 2017-04-21
  Administered 2017-04-16 – 2017-04-21 (×5): 5 mg via ORAL
  Filled 2017-04-16 (×6): qty 1

## 2017-04-16 MED ORDER — SENNOSIDES-DOCUSATE SODIUM 8.6-50 MG PO TABS: 1.0000 | ORAL_TABLET | Freq: Every evening | ORAL | Status: DC | PRN

## 2017-04-16 MED ORDER — ASPIRIN EC 81 MG PO TBEC
81.0000 mg | DELAYED_RELEASE_TABLET | Freq: Every day | ORAL | Status: DC
  Administered 2017-04-19 – 2017-04-20 (×2): 81 mg via ORAL
  Filled 2017-04-16 (×2): qty 1

## 2017-04-16 MED ORDER — GABAPENTIN 300 MG PO CAPS
600.0000 mg | ORAL_CAPSULE | Freq: Four times a day (QID) | ORAL | Status: DC
  Administered 2017-04-16 – 2017-04-21 (×16): 600 mg via ORAL
  Filled 2017-04-16 (×16): qty 2

## 2017-04-16 MED ORDER — AMITRIPTYLINE HCL 10 MG PO TABS
10.0000 mg | ORAL_TABLET | Freq: Every day | ORAL | Status: DC
  Administered 2017-04-16 – 2017-04-20 (×5): 10 mg via ORAL
  Filled 2017-04-16 (×6): qty 1

## 2017-04-16 MED ORDER — SODIUM CHLORIDE 0.9% FLUSH: 10.0000 mL | INTRAVENOUS | Status: DC | PRN

## 2017-04-16 MED ORDER — FERROUS SULFATE 325 (65 FE) MG PO TABS
325.0000 mg | ORAL_TABLET | Freq: Two times a day (BID) | ORAL | Status: DC
  Administered 2017-04-16 – 2017-04-21 (×8): 325 mg via ORAL
  Filled 2017-04-16 (×8): qty 1

## 2017-04-16 MED ORDER — AMIODARONE HCL 200 MG PO TABS
200.0000 mg | ORAL_TABLET | Freq: Every day | ORAL | Status: DC
  Administered 2017-04-18 – 2017-04-21 (×4): 200 mg via ORAL
  Filled 2017-04-16 (×5): qty 1

## 2017-04-16 MED ORDER — SIMVASTATIN 20 MG PO TABS
20.0000 mg | ORAL_TABLET | Freq: Every day | ORAL | Status: DC
  Administered 2017-04-18 – 2017-04-21 (×4): 20 mg via ORAL
  Filled 2017-04-16 (×4): qty 1

## 2017-04-16 MED ORDER — GABAPENTIN 100 MG PO CAPS
100.0000 mg | ORAL_CAPSULE | Freq: Four times a day (QID) | ORAL | Status: DC
  Administered 2017-04-16 – 2017-04-21 (×16): 100 mg via ORAL
  Filled 2017-04-16 (×15): qty 1

## 2017-04-16 MED ORDER — CEFAZOLIN SODIUM-DEXTROSE 1-4 GM/50ML-% IV SOLN
1.0000 g | INTRAVENOUS | Status: AC
  Administered 2017-04-17: 1 g via INTRAVENOUS
  Filled 2017-04-16: qty 50

## 2017-04-16 MED ORDER — SODIUM CHLORIDE 0.9% FLUSH
10.0000 mL | Freq: Two times a day (BID) | INTRAVENOUS | Status: DC
  Administered 2017-04-16 – 2017-04-17 (×2): 30 mL
  Administered 2017-04-19 (×2): 10 mL
  Administered 2017-04-20: 30 mL
  Administered 2017-04-20: 10 mL

## 2017-04-16 MED ORDER — GLIMEPIRIDE 2 MG PO TABS
2.0000 mg | ORAL_TABLET | Freq: Every day | ORAL | Status: DC
  Administered 2017-04-19 – 2017-04-21 (×3): 2 mg via ORAL
  Filled 2017-04-16 (×5): qty 1

## 2017-04-16 MED ORDER — OXYCODONE HCL 5 MG PO TABS
5.0000 mg | ORAL_TABLET | ORAL | Status: DC | PRN
  Administered 2017-04-16 – 2017-04-21 (×6): 5 mg via ORAL
  Filled 2017-04-16 (×5): qty 1

## 2017-04-16 MED ORDER — GLUCERNA PO LIQD
237.0000 mL | Freq: Two times a day (BID) | ORAL | Status: DC
  Administered 2017-04-18 – 2017-04-20 (×3): 237 mL via ORAL
  Filled 2017-04-16 (×8): qty 250

## 2017-04-16 MED ORDER — ONDANSETRON HCL 4 MG/2ML IJ SOLN: 4.0000 mg | Freq: Four times a day (QID) | INTRAMUSCULAR | Status: DC | PRN

## 2017-04-16 MED ORDER — ACETAMINOPHEN 325 MG PO TABS: 650.0000 mg | ORAL_TABLET | Freq: Four times a day (QID) | ORAL | Status: DC | PRN

## 2017-04-16 MED ORDER — ACETAMINOPHEN 500 MG PO TABS: 500.0000 mg | ORAL_TABLET | ORAL | Status: DC | PRN

## 2017-04-16 MED ORDER — SODIUM CHLORIDE 0.9 % IV SOLN
INTRAVENOUS | Status: DC
  Administered 2017-04-17: via INTRAVENOUS

## 2017-04-16 NOTE — NC FL2 (Addendum)
[place] LEVEL OF CARE SCREENING TOOL     IDENTIFICATION  Patient Name: Sharon [name] Birthdate: 1933-08-08 Sex: female Admission Date (Current Location): (Not on file)  [place] and [place] Number:  Engineering geologist and Address:  [facility], [address], [place], [place] [address]      Provider Number: [id number]  Attending Physician Name and Address:  Fritzi Mandes, MD  Relative Name and Phone Number:       Current Level of Care: Hospital Recommended Level of Care: Power Prior Approval Number:    Date Approved/Denied:   PASRR Number: [id number])  Discharge Plan: SNF    Current Diagnoses: Patient Active Problem List   Diagnosis Date Noted  . Diabetic neuropathy ([place]) 04/07/2017  . Septic shock ([place]) 03/20/2017  . CKD (chronic kidney disease), stage III ([place]) 03/19/2017  . Goals of care, counseling/discussion 03/03/2017  . PAD (peripheral artery disease) ([place]) 02/03/2017  . Abdominal aortic stenosis 02/03/2017  . Bilateral lower extremity edema 02/03/2017  . Anemia 01/29/2017  . B12 deficiency 01/29/2017  . Pancreatic mass 01/29/2017  . Hyponatremia 07/07/2015  . Foot ulcer ([place]) 02/07/2015  . ARF (acute renal failure) ([place]) 11/05/2014  . Diabetic foot infection ([place]) 10/10/2014  . Chronic diastolic heart failure ([place]) 05/31/2014  . HTN (hypertension) 05/31/2014  . DM (diabetes mellitus), type 2, uncontrolled ([place]) 05/31/2014    Orientation RESPIRATION BLADDER Height & Weight     Self, Time, Place  Normal Continent Weight:   Height:     BEHAVIORAL SYMPTOMS/MOOD NEUROLOGICAL BOWEL NUTRITION STATUS      Continent Diet  AMBULATORY STATUS COMMUNICATION OF NEEDS Skin   Extensive Assist Verbally Normal                       Personal Care Assistance Level of Assistance  Bathing, Dressing, Feeding Bathing Assistance: Limited assistance Feeding assistance: Independent Dressing  Assistance: Limited assistance     Functional Limitations Info  Sight, Hearing, Speech Sight Info: Adequate Hearing Info: Adequate Speech Info: Adequate    SPECIAL CARE FACTORS FREQUENCY  PT (By licensed PT), OT (By licensed OT)     PT Frequency: (5) OT Frequency: (5)            Contractures      Additional Factors Info  Code Status, Allergies Code Status Info: (Full Code) Allergies Info: (CARBAMAZEPINE, CODEINE, CYMBALTA DULOXETINE HCL, DULOXETINE, LYRICA PREGABALIN )           Current Medications (04/16/2017):  This is the current hospital active medication list No current facility-administered medications for this encounter.    Current Outpatient Medications  Medication Sig Dispense Refill  . acetaminophen (TYLENOL) 500 MG tablet Take 500 mg by mouth every 4 (four) hours as needed for mild pain or moderate pain.    . Amino Acids-Protein Hydrolys (FEEDING SUPPLEMENT, PRO-STAT SUGAR FREE 64,) LIQD Take 30 mLs by mouth 2 (two) times daily between meals.    Marland Kitchen amiodarone (PACERONE) 200 MG tablet Take 1 tablet (200 mg total) by mouth daily. 30 tablet 2  . aspirin 81 MG tablet Take 81 mg by mouth daily.     . bisacodyl (DULCOLAX) 5 MG EC tablet Take 2 tablets (10 mg total) by mouth every other day. 30 tablet 0  . cholecalciferol (VITAMIN D) 1000 units tablet Take 1,000 Units by mouth daily.    . ferrous sulfate 325 (65 FE) MG tablet Take 325 mg by mouth  2 (two) times daily.    . furosemide (LASIX) 20 MG tablet Take 1 tablet (20 mg total) by mouth daily. 30 tablet 0  . gabapentin (NEURONTIN) 600 MG tablet Take 600 mg by mouth 4 (four) times daily.     Marland Kitchen glimepiride (AMARYL) 2 MG tablet Take 2 mg by mouth daily with breakfast.     . GLUCERNA (GLUCERNA) LIQD Take 237 mLs by mouth 2 (two) times daily between meals.    Marland Kitchen levothyroxine (SYNTHROID, LEVOTHROID) 112 MCG tablet Take 112 mcg by mouth daily before breakfast. 30 minutes before breakfast    . metoprolol tartrate  (LOPRESSOR) 25 MG tablet Take 1 tablet (25 mg total) by mouth 2 (two) times daily. 60 tablet 0  . Multiple Vitamins-Minerals (PRESERVISION AREDS 2+MULTI VIT PO) Take 1 capsule by mouth 2 (two) times daily.     Marland Kitchen oxyCODONE (OXY IR/ROXICODONE) 5 MG immediate release tablet Take 1 tablet (5 mg total) by mouth every 4 (four) hours as needed for moderate pain or severe pain. 30 tablet 0  . potassium chloride (MICRO-K) 10 MEQ CR capsule Take 10 mEq by mouth daily.    Marland Kitchen senna-docusate (SENOKOT-S) 8.6-50 MG tablet Take 1 tablet by mouth 2 (two) times daily. 30 tablet 2  . simvastatin (ZOCOR) 20 MG tablet Take 20 mg by mouth daily.    . vitamin C (ASCORBIC ACID) 500 MG tablet Take 500 mg by mouth 2 (two) times daily.     Facility-Administered Medications Ordered in Other Encounters  Medication Dose Route Frequency Provider Last Rate Last Dose  . sodium chloride flush (NS) 0.9 % injection 10 mL  10 mL Intracatheter PRN Karen Kitchens, NP         Discharge Medications: Please see discharge summary for a list of discharge medications.  Relevant Imaging Results:  Relevant Lab Results:   Additional Information (SSN: [id number])  Smith Mince, Student-Social Work

## 2017-04-16 NOTE — Progress Notes (Signed)
Pharmacy Antibiotic Note  Sharon [name] is a 84 y.o. female admitted on 04/16/2017 with Osteomyelitis.  Pharmacy has been consulted for Zosyn dosing.  Plan: Start Zosyn 3.375g IV q8h (CrCl 36.87ml/min)  Per MD, will need at least 4 weeks of IV antibiotics.   Temp (24hrs), Avg:98.1 F (36.7 C), Min:98.1 F (36.7 C), Max:98.1 F (36.7 C)  No results for input(s): WBC, CREATININE, LATICACIDVEN, VANCOTROUGH, VANCOPEAK, VANCORANDOM, GENTTROUGH, GENTPEAK, GENTRANDOM, TOBRATROUGH, TOBRAPEAK, TOBRARND, AMIKACINPEAK, AMIKACINTROU, AMIKACIN in the last 168 hours.  Estimated Creatinine Clearance: 36.8 mL/min (A) (by C-G formula based on SCr of 1.08 mg/dL (H)).    Allergies  Allergen Reactions  . Carbamazepine Other (See Comments)    Reaction: unknown  . Codeine Other (See Comments)    Other reaction(s): Other (See Comments) Patient states she did not feel herself when she took it. unknown  . Cymbalta [Duloxetine Hcl] Other (See Comments)    Confusion, disorientation  . Duloxetine     Other reaction(s): Other (See Comments) Confusion, disorientation  . Lyrica [Pregabalin] Other (See Comments)    Patient and son states this makes the patient confused.    Antimicrobials this admission: 4/4 Zosyn >>    (at least 4 weeks)   Dose adjustments this admission:   Microbiology results:   Thank you for allowing pharmacy to be a part of this patient's care.  Candelaria Stagers, PharmD Pharmacy Resident  04/16/2017 4:50 PM

## 2017-04-16 NOTE — Progress Notes (Signed)
BP 170/77. MD notified. New order obtained.

## 2017-04-16 NOTE — Progress Notes (Signed)
Chaplain responded to a OR for prayer. Chaplain practiced active listening and presence as Pt talked about her illness and concern of the surgery on her foot. The pt expressed regret about not doing everything to take care of her foot. Chaplain asked if he could follow up when he get back.    04/16/17 1900  Clinical Encounter Type  Visited With Patient  Visit Type Initial  Referral From Nurse  Spiritual Encounters  Spiritual Needs Prayer

## 2017-04-16 NOTE — Consult Note (Signed)
[facility] Consult Note  MRN : [id number]  Sharon [name] is a 84 y.o. (September 25, 1933) female who presents with chief complaint of No chief complaint on file. Marland Kitchen  History of Present Illness:   I am asked to evaluate the patient by Dr. Posey Pronto.  The patient is an 84 year old woman admitted to the hospital with nonhealing wound of the right foot.  She has been followed by Dr. Elvina Mattes who is concerned that her arterial insufficiency is hampering her healing process.  She has a history of angiography and intervention performed by Dr. Lucky Cowboy in the past.  Bilateral common iliac stents were placed at that time.  Right lower extremity was not examined distally.  At the present time she is denying pain.  She is denying drainage.  There is no fever chills.  She denies claudication symptoms.  Current Facility-Administered Medications  Medication Dose Route Frequency Provider Last Rate Last Dose  . [START ON 04/17/2017] 0.9 %  sodium chloride infusion   Intravenous Continuous Schnier, Dolores Lory, MD      . acetaminophen (TYLENOL) tablet 650 mg  650 mg Oral Q6H PRN Fritzi Mandes, MD       Or  . acetaminophen (TYLENOL) suppository 650 mg  650 mg Rectal Q6H PRN Fritzi Mandes, MD      . amiodarone (PACERONE) tablet 200 mg  200 mg Oral Daily Fritzi Mandes, MD      . amitriptyline (ELAVIL) tablet 10 mg  10 mg Oral QHS Fritzi Mandes, MD      . Derrill Memo ON 04/17/2017] aspirin EC tablet 81 mg  81 mg Oral Daily Fritzi Mandes, MD      . bisacodyl (DULCOLAX) EC tablet 10 mg  10 mg Oral Cherylynn Ridges, MD      . Derrill Memo ON 04/17/2017] ceFAZolin (ANCEF) IVPB 1 g/50 mL premix  1 g Intravenous On Call Schnier, Dolores Lory, MD      . Derrill Memo ON 04/17/2017] cholecalciferol (VITAMIN D) tablet 1,000 Units  1,000 Units Oral Daily Fritzi Mandes, MD      . ferrous sulfate tablet 325 mg  325 mg Oral BID Fritzi Mandes, MD      . Derrill Memo ON 04/17/2017] furosemide (LASIX) tablet 20 mg  20 mg Oral Daily Fritzi Mandes, MD      .  gabapentin (NEURONTIN) capsule 100 mg  100 mg Oral QID Fritzi Mandes, MD      . gabapentin (NEURONTIN) capsule 600 mg  600 mg Oral QID Fritzi Mandes, MD      . Derrill Memo ON 04/17/2017] glimepiride (AMARYL) tablet 2 mg  2 mg Oral Q breakfast Fritzi Mandes, MD      . Derrill Memo ON 04/17/2017] GLUCERNA liquid 237 mL  237 mL Oral BID BM Fritzi Mandes, MD      . insulin aspart (novoLOG) injection 0-9 Units  0-9 Units Subcutaneous TID WC Fritzi Mandes, MD   1 Units at 04/16/17 1859  . [START ON 04/17/2017] levothyroxine (SYNTHROID, LEVOTHROID) tablet 112 mcg  112 mcg Oral QAC breakfast Fritzi Mandes, MD      . losartan (COZAAR) tablet 25 mg  25 mg Oral BID Fritzi Mandes, MD      . metoprolol tartrate (LOPRESSOR) tablet 25 mg  25 mg Oral BID Fritzi Mandes, MD      . multivitamin-lutein (OCUVITE-LUTEIN) capsule 1 capsule  1 capsule Oral BID Fritzi Mandes, MD      . mupirocin ointment (BACTROBAN) 2 % 1 application  1 application  Nasal BID Schnier, Dolores Lory, MD      . ondansetron [facility]) tablet 4 mg  4 mg Oral Q6H PRN Fritzi Mandes, MD       Or  . ondansetron [facility]) injection 4 mg  4 mg Intravenous Q6H PRN Fritzi Mandes, MD      . oxyCODONE (Oxy IR/ROXICODONE) immediate release tablet 5 mg  5 mg Oral Q4H PRN Fritzi Mandes, MD   5 mg at 04/16/17 2024  . piperacillin-tazobactam (ZOSYN) IVPB 3.375 g  3.375 g Intravenous Q8H ShuderColletta Maryland, RPH 12.5 mL/hr at 04/16/17 1900 3.375 g at 04/16/17 1900  . senna-docusate (Senokot-S) tablet 1 tablet  1 tablet Oral QHS PRN Fritzi Mandes, MD      . senna-docusate (Senokot-S) tablet 1 tablet  1 tablet Oral BID Fritzi Mandes, MD      . simvastatin (ZOCOR) tablet 20 mg  20 mg Oral Daily Fritzi Mandes, MD      . sodium chloride flush (NS) 0.9 % injection 10-40 mL  10-40 mL Intracatheter Q12H Fritzi Mandes, MD      . sodium chloride flush (NS) 0.9 % injection 10-40 mL  10-40 mL Intracatheter PRN Fritzi Mandes, MD      . traMADol [facility]at[facility]bon) tablet 50 mg  50 mg Oral Q6H PRN Fritzi Mandes, MD      . vitamin C (ASCORBIC  ACID) tablet 500 mg  500 mg Oral BID Fritzi Mandes, MD       Facility-Administered Medications Ordered in Other Encounters  Medication Dose Route Frequency Provider Last Rate Last Dose  . sodium chloride flush (NS) 0.9 % injection 10 mL  10 mL Intracatheter PRN Karen Kitchens, NP        Past Medical History:  Diagnosis Date  . Anemia   . Atrial fibrillation ([place])   . Cardiac arrest ([place])   . Cataract   . CHF (congestive heart failure) ([place])   . Diabetes mellitus without complication ([place])   . Edema    feet/ankles occas  . Hip fracture ([place])   . Hyperlipidemia   . Hypertension   . Hypothyroid   . Neuropathy   . Osteomyelitis ([place])    left first metatarsal  . Stroke [facility])     Past Surgical History:  Procedure Laterality Date  . ACHILLES TENDON SURGERY Left 02/08/2015   Procedure: ACHILLES LENGTHENING/KIDNER;  Surgeon: Albertine Patricia, DPM;  Location: [facility] ORS;  Service: Podiatry;  Laterality: Left;  . APPENDECTOMY    . CARDIAC CATHETERIZATION  08/25/13  . CATARACT EXTRACTION W/PHACO Left 07/20/2014   Procedure: CATARACT EXTRACTION PHACO AND INTRAOCULAR LENS PLACEMENT (IOC);  Surgeon: Leandrew Koyanagi, MD;  Location: [facility] ORS;  Service: Ophthalmology;  Laterality: Left;  [place]  1:18                 AP     23.6             CDE   9.69      lot #[id number]  . CHOLECYSTECTOMY    . CORONARY ANGIOPLASTY    . HALLUX VALGUS AKIN Left 02/08/2015   Procedure: HALLUX VALGUS AKIN/ KELLER;  Surgeon: Albertine Patricia, DPM;  Location: [facility] ORS;  Service: Podiatry;  Laterality: Left;  IVA with Local needs 1 hour for this case   . HEMIARTHROPLASTY HIP Right   . HEMIARTHROPLASTY HIP Left   . INCISION AND DRAINAGE Left 10/11/2014   Procedure: Removal of infected tibial sessmoid;  Surgeon: Albertine Patricia, DPM;  Location: [facility] ORS;  Service: Podiatry;  Laterality: Left;  . IRRIGATION AND DEBRIDEMENT FOOT Right 03/21/2017   Procedure: IRRIGATION AND DEBRIDEMENT FOOT right great toe amputation;  Surgeon:  Sharlotte Alamo, DPM;  Location: [facility] ORS;  Service: Podiatry;  Laterality: Right;    Social History Social History   Tobacco Use  . Smoking status: Former Smoker    Packs/day: 0.50    Years: 7.00    Pack years: 3.50    Types: Cigarettes    Last attempt to quit: 05/30/1989    Years since quitting: 27.8  . Smokeless tobacco: Never Used  Substance Use Topics  . Alcohol use: No    Alcohol/week: 0.0 oz  . Drug use: No    Family History Family History  Problem Relation Age of Onset  . Heart attack Mother   No family history of bleeding/clotting disorders, porphyria or autoimmune disease   Allergies  Allergen Reactions  . Carbamazepine Other (See Comments)    Reaction: unknown  . Cymbalta [Duloxetine Hcl] Other (See Comments)    Confusion, disorientation  . Duloxetine     Other reaction(s): Other (See Comments) Confusion, disorientation  . Lyrica [Pregabalin] Other (See Comments)    Patient and son states this makes the patient confused.     REVIEW OF SYSTEMS (Negative unless checked)  Constitutional: [] Weight loss  [] Fever  [] Chills Cardiac: [] Chest pain   [] Chest pressure   [] Palpitations   [] Shortness of breath when laying flat   [] Shortness of breath at rest   [] Shortness of breath with exertion. Vascular:  [] Pain in legs with walking   [] Pain in legs at rest   [] Pain in legs when laying flat   [] Claudication   [] Pain in feet when walking  [] Pain in feet at rest  [] Pain in feet when laying flat   [] History of DVT   [] Phlebitis   [] Swelling in legs   [] Varicose veins   [x] Non-healing ulcers Pulmonary:   [] Uses home oxygen   [] Productive cough   [] Hemoptysis   [] Wheeze  [] COPD   [] Asthma Neurologic:  [] Dizziness  [] Blackouts   [] Seizures   [] History of stroke   [] History of TIA  [] Aphasia   [] Temporary blindness   [] Dysphagia   [] Weakness or numbness in arms   [] Weakness or numbness in legs Musculoskeletal:  [] Arthritis   [] Joint swelling   [] Joint pain   [] Low back  pain Hematologic:  [] Easy bruising  [] Easy bleeding   [] Hypercoagulable state   [] Anemic  [] Hepatitis Gastrointestinal:  [] Blood in stool   [] Vomiting blood  [] Gastroesophageal reflux/heartburn   [] Difficulty swallowing. Genitourinary:  [] Chronic kidney disease   [] Difficult urination  [] Frequent urination  [] Burning with urination   [] Blood in urine Skin:  [] Rashes   [x] Ulcers   [x] Wounds Psychological:  [] History of anxiety   []  History of major depression.   Physical Examination  Vitals:   04/16/17 1712  BP: (!) 182/59  Pulse: 62  Resp: 20  Temp: 98.3 F (36.8 C)  TempSrc: Oral  SpO2: 95%   There is no height or weight on file to calculate BMI.  Head: [place]/AT, No temporalis wasting. Prominent temp pulse not noted. Ear/Nose/Throat: Nares w/o erythema or drainage, oropharynx w/o obsrtuction, Mallampati score:3.  Dentition poor.  Eyes: PERRLA, Sclera nonicteric.  Neck: Supple, no nuchal rigidity.  No bruit or JVD.  Pulmonary:  Breath sounds equal bilaterally, no use of accessory muscles.  Cardiac: RRR, normal S1, S2, no Murmurs, rubs or gallops. Vascular: Right foot is dressed there is partial amputation of  the great toe.  Ulcer can be seen under the dressing.  There is 1-2-second capillary refill.  Pedal pulses are nonpalpable on the right.  On the left there is a 2+ dorsalis pedis pulse posterior tibial is nonpalpable Gastrointestinal: soft, non-tender, non-distended.  Musculoskeletal: Moves all extremities.  No deformity or atrophy. No edema. Neurologic: CN 2-12 intact. Symmetrical.  Speech is fluent.  Psychiatric: Judgment intact, Mood & affect appropriate for pt's clinical situation. Dermatologic: No rashes or ulcers noted.  No cellulitis or open wounds. Lymph : No Cervical,  or Inguinal lymphadenopathy.      CBC Lab Results  Component Value Date   WBC 6.5 04/16/2017   HGB 11.2 (L) 04/16/2017   HCT 35.2 04/16/2017   MCV 81.5 04/16/2017   PLT 231 04/16/2017     BMET    Component Value Date/Time   NA 137 04/16/2017 1727   NA 136 (A) 10/19/2014   NA 132 (L) 04/13/2014 1416   K 4.3 04/16/2017 1727   K 3.8 04/13/2014 1416   CL 105 04/16/2017 1727   CL 100 (L) 04/13/2014 1416   CO2 26 04/16/2017 1727   CO2 24 04/13/2014 1416   GLUCOSE 140 (H) 04/16/2017 1727   GLUCOSE 123 (H) 04/13/2014 1416   BUN 31 (H) 04/16/2017 1727   BUN 17 10/21/2014   BUN 15 04/13/2014 1416   CREATININE 1.24 (H) 04/16/2017 1727   CREATININE 1.04 (H) 04/13/2014 1416   CALCIUM 9.0 04/16/2017 1727   CALCIUM 8.7 (L) 04/13/2014 1416   GFRNONAA 40 (L) 04/16/2017 1727   GFRNONAA 51 (L) 04/13/2014 1416   GFRAA 46 (L) 04/16/2017 1727   GFRAA 60 (L) 04/13/2014 1416   Estimated Creatinine Clearance: 32 mL/min (A) (by C-G formula based on SCr of 1.24 mg/dL (H)).  COAG Lab Results  Component Value Date   INR 1.13 03/19/2017   INR 0.94 02/07/2015   INR 1.0 05/26/2012      Assessment/Plan 1.  Atherosclerotic occlusive disease bilateral lower extremities with right foot nonhealing ulcer   Recommend:  The patient has evidence of severe atherosclerotic changes of both lower extremities associated with ulceration and tissue loss of the foot.  This represents a limb threatening ischemia and places the patient at the risk for limb loss.  Patient should undergo angiography of the lower extremities with the hope for intervention for limb salvage.  The risks and benefits as well as the alternative therapies was discussed in detail with the patient.  All questions were answered.  Patient agrees to proceed with angiography.  The patient will follow up with me in the office after the procedure.   continue IV zosyn--- pharmacy to dose (last dose per ID rec is 04/20/2017)  2.DM-2 Continue hypoglycemic medications as already ordered, these medications have been reviewed and there are no changes at this time.  Hgb A1C to be monitored as already arranged by primary  service  3. HTN  Continue antihypertensive medications as already ordered, these medications have been reviewed and there are no changes at this time.   4. Chronic diastolic congestive heart failure Continue cardiac and antihypertensive medications as already ordered and reviewed, no changes at this time.  Continue statin as ordered and reviewed, no changes at this time  Nitrates PRN for chest pain  5. Paroxysmal Afib Continue antiarrhythmia medications as already ordered, these medications have been reviewed and there are no changes at this time.  Continue anticoagulation as ordered by Cardiology Service    Belenda Cruise  Schnier, MD  04/16/2017 8:41 PM

## 2017-04-16 NOTE — H&P (Signed)
[facility] NAME: Sharon [name]    MR#:  [id number]  DATE OF BIRTH:  [birth year]  DATE OF ADMISSION:  04/16/2017  PRIMARY CARE PHYSICIAN: Rusty Aus, MD   REQUESTING/REFERRING PHYSICIAN: Dr. Elvina Mattes  CHIEF COMPLAINT:   Nonhealing right lower extremity ulcer HISTORY OF PRESENT ILLNESS:  Sharon [name]  is a 84 y.o. female with a known history of atrial fibrillation, diabetes, peripheral vascular disease is a direct admit from podiatry office for nonhealing right foot ulcer. It appears patient has wound dehiscence and Dr. Elvina Mattes request direct admission for further evaluation and management. Patient was recently admitted underwent first right toe ray amputation in March 2019. She has a PICC line and supposed to get IV Zosyn--- last dose 04/20/2017.  PAST MEDICAL HISTORY:   Past Medical History:  Diagnosis Date  . Anemia   . Atrial fibrillation ([place])   . Cardiac arrest ([place])   . Cataract   . CHF (congestive heart failure) ([place])   . Diabetes mellitus without complication ([place])   . Edema    feet/ankles occas  . Hip fracture ([place])   . Hyperlipidemia   . Hypertension   . Hypothyroid   . Neuropathy   . Osteomyelitis ([place])    left first metatarsal  . Stroke ([place])     PAST SURGICAL HISTOIRY:   Past Surgical History:  Procedure Laterality Date  . ACHILLES TENDON SURGERY Left 02/08/2015   Procedure: ACHILLES LENGTHENING/KIDNER;  Surgeon: Albertine Patricia, DPM;  Location: [facility] ORS;  Service: Podiatry;  Laterality: Left;  . APPENDECTOMY    . CARDIAC CATHETERIZATION  08/25/13  . CATARACT EXTRACTION W/PHACO Left 07/20/2014   Procedure: CATARACT EXTRACTION PHACO AND INTRAOCULAR LENS PLACEMENT (IOC);  Surgeon: Leandrew Koyanagi, MD;  Location: [facility] ORS;  Service: Ophthalmology;  Laterality: Left;  [place]  1:18                 AP     23.6             CDE   9.69      lot #[id number]  . CHOLECYSTECTOMY    . CORONARY ANGIOPLASTY     . HALLUX VALGUS AKIN Left 02/08/2015   Procedure: HALLUX VALGUS AKIN/ KELLER;  Surgeon: Albertine Patricia, DPM;  Location: [facility] OR[facility];  Service: Podiatry;  Laterality: Left;  IVA with Local needs 1 hour for this case   . HEMIARTHROPLASTY HIP Right   . HEMIARTHROPLASTY HIP Left   . INCISION AND DRAINAGE Left 10/11/2014   Procedure: Removal of infected tibial sessmoid;  Surgeon: Albertine Patricia, DPM;  Location: [facility] OR[facility];  Service: Podiatry;  Laterality: Left;  . IRRIGATION AND DEBRIDEMENT FOOT Right 03/21/2017   Procedure: IRRIGATION AND DEBRIDEMENT FOOT right great toe amputation;  Surgeon: Sharlotte Alamo, DPM;  Location: [facility] ORS;  Service: Podiatry;  Laterality: Right;    SOCIAL HISTORY:   Social History   Tobacco Use  . Smoking status: Former Smoker    Packs/day: 0.50    Years: 7.00    Pack years: 3.50    Types: Cigarettes    Last attempt to quit: 05/30/1989    Years since quitting: 27.8  . Smokeless tobacco: Never Used  Substance Use Topics  . Alcohol use: No    Alcohol/week: 0.0 oz    FAMILY HISTORY:   Family History  Problem Relation Age of Onset  . Heart attack Mother     DRUG ALLERGIES:   Allergies  Allergen  Reactions  . Carbamazepine Other (See Comments)    Reaction: unknown  . Cymbalta [Duloxetine Hcl] Other (See Comments)    Confusion, disorientation  . Duloxetine     Other reaction(s): Other (See Comments) Confusion, disorientation  . Lyrica [Pregabalin] Other (See Comments)    Patient and son states this makes the patient confused.    REVIEW OF SYSTEMS:  Review of Systems  Constitutional: Negative for chills, fever and weight loss.  HENT: Negative for ear discharge, ear pain and nosebleeds.   Eyes: Negative for blurred vision, pain and discharge.  Respiratory: Negative for sputum production, shortness of breath, wheezing and stridor.   Cardiovascular: Negative for chest pain, palpitations, orthopnea and PND.  Gastrointestinal: Negative for abdominal pain,  diarrhea, nausea and vomiting.  Genitourinary: Negative for frequency and urgency.  Musculoskeletal: Negative for back pain and joint pain.  Neurological: Positive for weakness. Negative for sensory change, speech change and focal weakness.  Psychiatric/Behavioral: Negative for depression and hallucinations. The patient is not nervous/anxious.      MEDICATIONS AT HOME:   Prior to Admission medications   Medication Sig Start Date End Date Taking? Authorizing Provider  acetaminophen (TYLENOL) 500 MG tablet Take 500 mg by mouth every 4 (four) hours as needed for mild pain or moderate pain.    [provider]  Amino Acids-Protein Hydrolys (FEEDING SUPPLEMENT, PRO-STAT SUGAR FREE 64,) LIQD Take 30 mLs by mouth 2 (two) times daily between meals.    [provider]  amiodarone (PACERONE) 200 MG tablet Take 1 tablet (200 mg total) by mouth daily. 03/24/17   Gladstone Lighter, MD  aspirin 81 MG tablet Take 81 mg by mouth daily.     [provider]  bisacodyl (DULCOLAX) 5 MG EC tablet Take 2 tablets (10 mg total) by mouth every other day. 03/25/17   Gladstone Lighter, MD  cholecalciferol (VITAMIN D) 1000 units tablet Take 1,000 Units by mouth daily.    [provider]  ferrous sulfate 325 (65 FE) MG tablet Take 325 mg by mouth 2 (two) times daily.    [provider]  furosemide (LASIX) 20 MG tablet Take 1 tablet (20 mg total) by mouth daily. 03/23/17   Gladstone Lighter, MD  gabapentin (NEURONTIN) 600 MG tablet Take 600 mg by mouth 4 (four) times daily.     [provider]  glimepiride (AMARYL) 2 MG tablet Take 2 mg by mouth daily with breakfast.     [provider]  GLUCERNA (GLUCERNA) LIQD Take 237 mLs by mouth 2 (two) times daily between meals.    [provider]  levothyroxine (SYNTHROID, LEVOTHROID) 112 MCG tablet Take 112 mcg by mouth daily before breakfast. 30 minutes before breakfast    [provider]  metoprolol  tartrate (LOPRESSOR) 25 MG tablet Take 1 tablet (25 mg total) by mouth 2 (two) times daily. 03/23/17   Gladstone Lighter, MD  Multiple Vitamins-Minerals (PRESERVISION AREDS 2+MULTI VIT PO) Take 1 capsule by mouth 2 (two) times daily.     [provider]  oxyCODONE (OXY IR/ROXICODONE) 5 MG immediate release tablet Take 1 tablet (5 mg total) by mouth every 4 (four) hours as needed for moderate pain or severe pain. 03/23/17   Gladstone Lighter, MD  potassium chloride (MICRO-K) 10 MEQ CR capsule Take 10 mEq by mouth daily.    [provider]  senna-docusate (SENOKOT-S) 8.6-50 MG tablet Take 1 tablet by mouth 2 (two) times daily. 03/23/17   Gladstone Lighter, MD  simvastatin (ZOCOR) 20  MG tablet Take 20 mg by mouth daily.    [provider]  vitamin C (ASCORBIC ACID) 500 MG tablet Take 500 mg by mouth 2 (two) times daily.    [provider]      VITAL SIGNS:  Blood pressure (!) 182/59, pulse 62, temperature 98.3 F (36.8 C), temperature source Oral, resp. rate 20, SpO2 95 %.  PHYSICAL EXAMINATION:  GENERAL:  84 y.o.-year-old patient lying in the bed with no acute distress.  EYES: Pupils equal, round, reactive to light and accommodation. No scleral icterus. Extraocular muscles intact.  HEENT: Head atraumatic, normocephalic. Oropharynx and nasopharynx clear.  NECK:  Supple, no jugular venous distention. No thyroid enlargement, no tenderness.  LUNGS: Normal breath sounds bilaterally, no wheezing, rales,rhonchi or crepitation. No use of accessory muscles of respiration.  CARDIOVASCULAR: S1, S2 normal. No murmurs, rubs, or gallops.  ABDOMEN: Soft, nontender, nondistended. Bowel sounds present. No organomegaly or mass.  EXTREMITIES: right foot dressing present NEUROLOGIC: Cranial nerves II through XII are intact. Muscle strength 5/5 in all extremities. Sensation intact. Gait not checked.  PSYCHIATRIC: The patient is alert and oriented x 3.  SKIN: No obvious rash,  lesion, or ulcer.   LABORATORY PANEL:   CBC No results for input(s): WBC, HGB, HCT, PLT in the last 168 hours. ------------------------------------------------------------------------------------------------------------------  Chemistries  No results for input(s): NA, K, CL, CO2, GLUCOSE, BUN, CREATININE, CALCIUM, MG, AST, ALT, ALKPHOS, BILITOT in the last 168 hours.  Invalid input(s): GFRCGP ------------------------------------------------------------------------------------------------------------------  Cardiac Enzymes No results for input(s): TROPONINI in the last 168 hours. ------------------------------------------------------------------------------------------------------------------  RADIOLOGY:  No results found.  EKG:    IMPRESSION AND PLAN:    Sharon [name]  is a 84 y.o. female with a known history of atrial fibrillation, diabetes, peripheral vascular disease is a direct admit from podiatry office for nonhealing right foot ulcer. It appears patient has wound dehiscence and Dr. Elvina Mattes request direct admission for further evaluation and management.  1. right foot nonhealing ulcer -admit to medical floor -podiatry consultation with Dr. Elvina Mattes -vascular consultation with Dr. Delana Meyer for angiogram right lower extremity -continue IV zosyn--- pharmacy to dose (last dose per ID rec is 04/20/2017)  2.DM-2 -Insulin and SSI  3. HTN cont home meds  4. Chronic diastolic congestive heart failure-- compensated  5. Paroxysmal Afib-on amiodarone -patient not on anticoagulation,  Cont aspirin   All the records are reviewed and case discussed with ED provider. Management plans discussed with the patient, family and they are in agreement.  CODE STATUS: full  TOTAL TIME TAKING CARE OF THIS PATIENT: 50 minutes.    Fritzi Mandes M.D on 04/16/2017 at 5:13 PM  Between 7am to 6pm - Pager - [phone]  After 6pm go to [email or web address] - password [email or web address] [facility]  [facility]   Office  [phone]  CC: Primary care physician; Rusty Aus, MD

## 2017-04-16 NOTE — Progress Notes (Signed)
Family Meeting Note  Advance Directive:yes Today a meeting took place with the pt  The following were discussed:Patient's diagnosis: patient being admitted with right nonhealing diabetic foot ulcer. Has history of chronic osteomyelitis getting IV antibiotics at nursing home. Came in with large wound dehiscence., Patient's progosis: stable code status discussed. Patient wishes to be full code. Vascular and podiatry consultation placed  Time spent during discussion:18 mins  Fritzi Mandes, MD

## 2017-04-16 NOTE — Clinical Social Work Note (Addendum)
Clinical Social Work Assessment  Patient Details  Name: Sharon [name] MRN: [id number] Date of Birth: Dec 04, 1933  Date of referral:  04/16/17               Reason for consult:  Facility Placement                Permission sought to share information with:  Chartered certified accountant granted to share information::  Yes, Verbal Permission Granted  Name::      IT sales professional::   [place]  Relationship::     Contact Information:     Housing/Transportation Living arrangements for the past 2 months:  [place], [place] of Information:  Power of [place], Adult Children Patient Interpreter Needed:  None Criminal Activity/Legal Involvement Pertinent to Current Situation/Hospitalization:  No - Comment as needed Significant Relationships:  Adult Children Lives with:  Facility Resident Do you feel safe going back to the place where you live?  Yes Need for family participation in patient care:  Yes (Comment)  Care giving concerns: Patient has been a short-term resident at [facility] for about 20 days.   Social Worker assessment / plan: Holiday representative (CSW) reviewed chart and noted that patient is from [facility]. Patient is from [facility] for short term rehab. Patient is not on the floor yet so Social work intern contacted patient's son/HPOA Sharon [name] [phone]) to complete assessment. Social work Theatre manager introduced self and explained the role of the [place]. Per Sharon [name] patient is a short-term resident at [facility] and prior to that was living at home alone.  Per Sharon [name] he is patient's HPOA. Per Sharon [name] patient has been using a walker with assistance to ambulate but has overall been walking less since her procedure. Social work Theatre manager explained to patient's son that for [facility] to place a bed hold for patient they will have to pay for a bed hold and that a new authorization will have to be  approved by [facility] for SNF placement back to [place]. Patient's son verbalized his understanding and stated that he will talk to [facility] about a bed hold. FL2 completed and faxed out. CSW and social work Theatre manager will continue to follow up and assist.   [facility] is aware of above and stated she will speak with Sharon [name] about a bed hold.   Employment status:  Retired Forensic scientist:  Managed Care PT Recommendations:  Not assessed at this time Information / Referral to community resources:  [place]  Patient/Family's Response to care:Patient's son will speak with [facility] about placing a bed hold for patient.    Patient/Family's Understanding of and Emotional Response to Diagnosis, Current Treatment, and Prognosis: Patient's son was pleasant and thanked social work Theatre manager for her assistance.  Emotional Assessment Appearance:  Appears stated age Attitude/Demeanor/Rapport:  Unable to Assess Affect (typically observed):  Unable to Assess Orientation:  Oriented to Self, Oriented to Place, Oriented to  Time Alcohol / Substance use:  Not Applicable Psych involvement (Current and /or in the community):  No (Comment)  Discharge Needs  Concerns to be addressed:  Care Coordination, Discharge Planning Concerns Readmission within the last 30 days:  No Current discharge risk:  Dependent with Mobility Barriers to Discharge:  Continued Medical Work up   Sharon [name], Student-Social Work 04/16/2017, 3:44 PM

## 2017-04-16 NOTE — Progress Notes (Signed)
Location:      Place of Service:  SNF (31) Provider:  Toni Arthurs, NP-C  Rusty Aus, MD  Patient Care Team: Rusty Aus, MD as PCP - General (Internal Medicine) Clent Jacks, RN as Registered Nurse  Extended Emergency Contact Information Primary Emergency Contact: Choate,Marshall Address: [address]          [place], [place] [address] Johnnette Litter of [place] Phone: [phone] Work Phone: [phone] Mobile Phone: [phone] Relation: Son Secondary Emergency Contact: Keizer,Michael Address: [address]          [place], [place] [address] Johnnette Litter of [place] Phone: [phone] Mobile Phone: [phone] Relation: Son  Code Status: Full Goals of care: Advanced Directive information Advanced Directives 03/21/2017  Does Patient Have a Medical Advance Directive? Yes  Type of Advance Directive Out of facility DNR (pink MOST or yellow form)  Does patient want to make changes to medical advance directive? No - Patient declined  Copy of [place] in Chart? -  Would patient like information on creating a medical advance directive? -     Chief Complaint  Patient presents with  . Medical Management of Chronic Issues    HPI:  Pt is a 84 y.o. female seen today for medical management of chronic diseases.  Patient was admitted to the facility for rehab following right great toe amputation due to diabetic foot infection.  While here, patient also developed worsening diabetic poly-neuropathy of the left leg/foot.  For the polyneuropathy, patient was started on amitriptyline 10 mg p.o. nightly and had an increase in gabapentin to 700 mg p.o. 4 times daily for neuropathy.  Patient reports this has been effective for the neuropathic pain.  Patient has had a worsening of pain at the incision site on the right foot.  This has been requiring more narcotic pain medications.  These medications have not been sedating to the patient or causing altered  level of consciousness, confusion or hallucinations.  She has been stable using oxycodone 5 mg p.o. every 4 hours as needed.  However, the patient's son was not happy that she was on oxycodone for what ever reason.  I ordered tramadol 50 mg p.o. every 6 hours as needed to try.  Patient is having increased redness, warmth and pain in the left lower leg/ankle.  The left foot has 1+ pedal pulse with scant edema.  Unable to assess the right foot at this time due to surgical dressing in place.  Of note, patient also has a skin tear on the anterior chest wall left, lateral to the sternum.  Patient reports this is an area she scratched causing some irritation.  Nursing has been keeping the area covered with Allevyn dressing.  However, today, the wound is appearing to be nonhealing with green, purulent drainage.  Surrounding wound tissue with, irritated appearing.  Patient is afebrile.  She seems to have some worsening depression since learning of her upcoming surgeries.  Patient reports her appetite is good, voiding well, having regular BMs.  Vital signs stable.  No other complaints.    Past Medical History:  Diagnosis Date  . Anemia   . Atrial fibrillation ([place])   . Cardiac arrest ([place])   . Cataract   . CHF (congestive heart failure) ([place])   . Diabetes mellitus without complication ([place])   . Edema    feet/ankles occas  . Hip fracture ([place])   . Hyperlipidemia   . Hypertension   . Hypothyroid   .  Neuropathy   . Osteomyelitis ([place])    left first metatarsal  . Stroke [facility])    Past Surgical History:  Procedure Laterality Date  . ACHILLES TENDON SURGERY Left 02/08/2015   Procedure: ACHILLES LENGTHENING/KIDNER;  Surgeon: Albertine Patricia, DPM;  Location: [facility] ORS;  Service: Podiatry;  Laterality: Left;  . APPENDECTOMY    . CARDIAC CATHETERIZATION  08/25/13  . CATARACT EXTRACTION W/PHACO Left 07/20/2014   Procedure: CATARACT EXTRACTION PHACO AND INTRAOCULAR LENS PLACEMENT (IOC);  Surgeon: Leandrew Koyanagi, MD;  Location: [facility] ORS;  Service: Ophthalmology;  Laterality: Left;  [place]  1:18                 AP     23.6             CDE   9.69      lot #[id number]  . CHOLECYSTECTOMY    . CORONARY ANGIOPLASTY    . HALLUX VALGUS AKIN Left 02/08/2015   Procedure: HALLUX VALGUS AKIN/ KELLER;  Surgeon: Albertine Patricia, DPM;  Location: [facility] ORS;  Service: Podiatry;  Laterality: Left;  IVA with Local needs 1 hour for this case   . HEMIARTHROPLASTY HIP Right   . HEMIARTHROPLASTY HIP Left   . INCISION AND DRAINAGE Left 10/11/2014   Procedure: Removal of infected tibial sessmoid;  Surgeon: Albertine Patricia, DPM;  Location: [facility] ORS;  Service: Podiatry;  Laterality: Left;  . IRRIGATION AND DEBRIDEMENT FOOT Right 03/21/2017   Procedure: IRRIGATION AND DEBRIDEMENT FOOT right great toe amputation;  Surgeon: Sharlotte Alamo, DPM;  Location: [facility] ORS;  Service: Podiatry;  Laterality: Right;    Allergies  Allergen Reactions  . Carbamazepine Other (See Comments)    Reaction: unknown  . Codeine Other (See Comments)    Other reaction(s): Other (See Comments) Patient states she did not feel herself when she took it. unknown  . Cymbalta [Duloxetine Hcl] Other (See Comments)    Confusion, disorientation  . Duloxetine     Other reaction(s): Other (See Comments) Confusion, disorientation  . Lyrica [Pregabalin] Other (See Comments)    Patient and son states this makes the patient confused.    Allergies as of 04/16/2017      Reactions   Carbamazepine Other (See Comments)   Reaction: unknown   Codeine Other (See Comments)   Other reaction(s): Other (See Comments) Patient states she did not feel herself when she took it. unknown   Cymbalta [duloxetine Hcl] Other (See Comments)   Confusion, disorientation   Duloxetine    Other reaction(s): Other (See Comments) Confusion, disorientation   Lyrica [pregabalin] Other (See Comments)   Patient and son states this makes the patient confused.      Medication List     Notice   This visit is during an admission. Changes to the med list made in this visit will be reflected in the After Visit Summary of the admission.     Review of Systems  Constitutional: Negative for activity change, appetite change, chills, diaphoresis and fever.  HENT: Negative for congestion, mouth sores, nosebleeds, postnasal drip, sneezing, sore throat, trouble swallowing and voice change.   Respiratory: Negative for apnea, cough, choking, chest tightness, shortness of breath and wheezing.   Cardiovascular: Negative for chest pain, palpitations and leg swelling.  Gastrointestinal: Negative for abdominal distention, abdominal pain, constipation, diarrhea and nausea.  Genitourinary: Negative for difficulty urinating, dysuria, frequency and urgency.  Musculoskeletal: Positive for arthralgias (typical arthritis), gait problem and joint swelling. Negative for back pain and myalgias.  Skin:  Positive for color change and wound. Negative for pallor and rash.  Neurological: Negative for dizziness, tremors, syncope, speech difficulty, weakness, numbness and headaches.  Psychiatric/Behavioral: Negative for agitation and behavioral problems.  All other systems reviewed and are negative.    There is no immunization history on file for this patient. Pertinent  Health Maintenance Due  Topic Date Due  . OPHTHALMOLOGY EXAM  06/17/1943  . URINE MICROALBUMIN  06/17/1943  . DEXA SCAN  06/17/2000  . PNA vac Low Risk Adult (1 of 2 - PCV13) 06/17/2000  . FOOT EXAM  10/10/2015  . INFLUENZA VACCINE  08/13/2017  . HEMOGLOBIN A1C  09/21/2017   Fall Risk  02/17/2017  Falls in the past year? No  Risk for fall due to : Impaired balance/gait   Functional Status Survey:    Vitals:   04/15/17 2017  BP: (!) 160/50  Pulse: (!) 57  Resp: 16  Temp: 98.1 F (36.7 C)  SpO2: 97%  Weight: 141 lb 1.6 oz (64 kg)   Body mass index is 23.48 kg/m. Physical Exam  Constitutional: She is oriented to person,  place, and time. Vital signs are normal. She appears well-developed and well-nourished. She is active and cooperative. She does not appear ill. No distress.  HENT:  Head: Normocephalic and atraumatic.  Mouth/Throat: Uvula is midline, oropharynx is clear and moist and mucous membranes are normal. Mucous membranes are not pale, not dry and not cyanotic.  Eyes: Pupils are equal, round, and reactive to light. Conjunctivae, EOM and lids are normal.  Neck: Trachea normal, normal range of motion and full passive range of motion without pain. Neck supple. No JVD present. No tracheal deviation, no edema and no erythema present. No thyromegaly present.  Cardiovascular: Normal rate, regular rhythm, normal heart sounds and intact distal pulses. Exam reveals no gallop, no distant heart sounds and no friction rub.  No murmur heard. Pulses:      Dorsalis pedis pulses are 1+ on the left side.  Scant edema LLE  Pulmonary/Chest: Effort normal and breath sounds normal. No accessory muscle usage. No respiratory distress. She has no decreased breath sounds. She has no wheezes. She has no rhonchi. She has no rales. She exhibits no tenderness.  Abdominal: Soft. Normal appearance and bowel sounds are normal. She exhibits no distension and no ascites. There is no tenderness.  Musculoskeletal: She exhibits no edema.       Right ankle: She exhibits decreased range of motion (Right Great Toe amputation with persistent infection), swelling, laceration and abnormal pulse. Tenderness.       Left ankle: She exhibits decreased range of motion (cellulitis) and swelling. Tenderness.  Expected osteoarthritis, stiffness; Bilateral Calves soft, supple. Negative Homan's Sign. B- pedal pulses equal; generalized weakness  Neurological: She is alert and oriented to person, place, and time. She has normal strength. A sensory deficit is present. She displays a negative Romberg sign. Coordination and gait abnormal.  Skin: Skin is warm and  dry. Laceration (right foot) noted. She is not diaphoretic. There is erythema (left foot/leg). No cyanosis. No pallor. Nails show no clubbing.  Psychiatric: Her speech is normal. Judgment and thought content normal. Her mood appears anxious. She is withdrawn. Cognition and memory are normal. She exhibits a depressed mood.  Nursing note and vitals reviewed.   Labs reviewed: Recent Labs    03/25/17 0430 03/26/17 0535 04/07/17 1315  NA 135 135 134*  K 3.1* 3.7 4.1  CL 98* 103 99*  CO2 25 21* 24  GLUCOSE 145* 178* 201*  BUN 37* 35* 14  CREATININE 1.31* 1.39* 1.08*  CALCIUM 8.3* 8.2* 8.8*   Recent Labs    03/19/17 2018 03/25/17 0430 04/07/17 1315  AST 33 22 37  ALT 14 12* 24  ALKPHOS 76 66 135*  BILITOT 0.7 0.6 0.4  PROT 6.6 5.8* 6.6  ALBUMIN 2.7* 2.6* 3.0*   Recent Labs    03/22/17 0707 03/25/17 0430 04/07/17 1315  WBC 23.3* 11.5* 8.3  NEUTROABS 21.2* 7.9* 5.8  HGB 10.5* 9.9* 10.7*  HCT 33.7* 32.2* 33.4*  MCV 76.5* 77.4* 79.3*  PLT 377 426 212   Lab Results  Component Value Date   TSH 0.86 10/19/2014   Lab Results  Component Value Date   HGBA1C 7.1 (H) 03/21/2017   Lab Results  Component Value Date   CHOL 133 12/26/2013   HDL 36 (L) 12/26/2013   LDLCALC 28 12/26/2013   TRIG 345 (H) 12/26/2013    Significant Diagnostic Results in last 30 days:  Dg Chest 1 View  Result Date: 03/19/2017 CLINICAL DATA:  Short of breath EXAM: CHEST 1 VIEW COMPARISON:  10/15/2014 FINDINGS: Mild biapical scarring. No focal pulmonary opacity or pleural effusion. Stable cardiomediastinal silhouette with aortic atherosclerosis. No pneumothorax. Old right second rib fracture. IMPRESSION: No active disease. Electronically Signed   By: Donavan Foil M.D.   On: 03/19/2017 19:59   Dg Ankle Complete Right  Result Date: 03/19/2017 CLINICAL DATA:  Erythema. Evaluate for possible osteomyelitis. Personal history of left foot osteomyelitis in 2016. EXAM: RIGHT ANKLE - COMPLETE 3+ VIEW  COMPARISON:  None. FINDINGS: Mild diffuse soft tissue swelling. Osseous demineralization. No evidence of acute fracture or dislocation. Ankle mortise intact with mild joint space narrowing. No evidence of osteomyelitis. IMPRESSION: 1. No acute osseous abnormality.  No evidence of osteomyelitis. 2. Osseous demineralization. 3. Mild osteoarthritis. Electronically Signed   By: Evangeline Dakin M.D.   On: 03/19/2017 21:21   Ct Head Wo Contrast  Result Date: 03/19/2017 CLINICAL DATA:  84 year old with progressively worsening confusion over the past 2 days. Patient fell yesterday. Initial encounter. EXAM: CT HEAD WITHOUT CONTRAST TECHNIQUE: Contiguous axial images were obtained from the base of the skull through the vertex without intravenous contrast. COMPARISON:  12/02/2014, 11/05/2014. FINDINGS: Brain: Ventricular system normal in size and appearance for age. Mild age related cortical atrophy, unchanged. Moderate changes of small vessel disease of the white matter diffusely, unchanged. No mass lesion. No midline shift. No acute hemorrhage or hematoma. No extra-axial fluid collections. No evidence of acute infarction. Vascular: Mild bilateral carotid siphon and vertebral artery atherosclerosis. No hyperdense vessel. Skull: Marked hyperostosis frontalis interna bilaterally. No skull fracture or other focal osseous abnormality involving the skull. Sinuses/Orbits: Visualized paranasal sinuses, bilateral mastoid air cells and bilateral middle ear cavities well-aerated. Visualized orbits and globes intact. Hyperdense lens involving the right globe, query cataract. Other: None. IMPRESSION: 1. No acute intracranial abnormality. 2. Stable mild age related cortical atrophy and stable moderate chronic microvascular ischemic changes of the white matter. Electronically Signed   By: Evangeline Dakin M.D.   On: 03/19/2017 20:11   Mr Foot Right Wo Contrast  Result Date: 03/20/2017 CLINICAL DATA:  Diabetic with foot ulcerations  noted increasing erythema and swelling with drainage. EXAM: MRI OF THE RIGHT FOREFOOT WITHOUT CONTRAST TECHNIQUE: Multiplanar, multisequence MR imaging of the right forefoot was performed. No intravenous contrast was administered. COMPARISON:  None. FINDINGS: Bones/Joint/Cartilage Marrow edema of the great toe is identified involving the distal phalanx and head  of the first proximal phalanx, series 7, image 24 suspicious for changes of acute osteomyelitis. Additionally there is intraosseous elevated T2 signal within the distal first metatarsal associated with a cortical defect along the plantar aspect suspicious for a cloaca from chronic osteomyelitis. Callus is noted about a healing distal third metatarsal fracture involving the neck. Small focus of T2 hyperintensities seen that could represent a small posttraumatic cyst given its wall circumscribed margins. Ligaments No focal abnormality Muscles and Tendons No intramuscular mass or fluid collection. Focal abnormality of the extensor and flexor tendons crossing the forefoot. Soft tissues Plantar soft tissue ulceration of the forefoot adjacent to the first MTP articulation. Diffuse soft tissue edema of the included forefoot consistent with cellulitis and/or third spacing of fluid. IMPRESSION: 1. Diffuse soft tissue swelling of the included forefoot without focal drainable fluid collection or abscess identified. 2. Marrow abnormality of the distal phalanx and head of first proximal phalanx of the great toe associated with soft tissue ulceration along the plantar aspect the forefoot at the level of the first metatarsal head. Findings are concerning for changes of acute osteomyelitis. 3. Possible cloaca involving the distal first metatarsal with intraosseous elevated T2 signal suspicious for changes of chronic osteomyelitis. 4. Healing third metatarsal neck fracture with small focus of T2 hyperintensity more likely related to posttraumatic degenerative cyst though the  possibility of chronic osteomyelitis is not completely excluded as well. Electronically Signed   By: Ashley Royalty M.D.   On: 03/20/2017 22:53   Dg Chest Port 1 View  Result Date: 03/23/2017 CLINICAL DATA:  84 year old female admitted with sepsis and right foot osteomyelitis, postoperative day 2 status post amputation of the right foot 1st ray and midfoot debridement. EXAM: PORTABLE CHEST 1 VIEW COMPARISON:  03/19/2017 and earlier. FINDINGS: Portable AP upright view at 0700 hours. Stable lung volumes. Stable cardiac size and mediastinal contours. Visualized tracheal air column is within normal limits. No pneumothorax. No pulmonary edema or pleural effusion. Mildly increased left greater than right upper lung interstitial opacity. No other confluent opacity. Calcified aortic atherosclerosis. IMPRESSION: 1. Mild asymmetric and increased upper lung interstitial opacity since 03/19/2017. Consider developing bilateral respiratory infection. 2. No pleural effusion or other acute cardiopulmonary abnormality. Electronically Signed   By: Genevie Ann M.D.   On: 03/23/2017 07:56   Dg Foot Complete Left  Result Date: 03/19/2017 CLINICAL DATA:  Erythema. Evaluate for possible osteomyelitis. Personal history of left foot osteomyelitis in 2016. EXAM: LEFT FOOT - COMPLETE 3+ VIEW COMPARISON:  Left foot MRI 10/10/2014 left foot x-rays 02/23/2012. FINDINGS: No evidence of acute fracture or dislocation. Osseous demineralization. Hammertoe deformity. Likely remote healed fracture involving the proximal phalanx of the second toe. No evidence of osteomyelitis. Narrowing of the IP joint spaces of the toes and narrowing of joint spaces in the midfoot, unchanged. Small plantar calcaneal spur. Small enthesopathic spur at the insertion of the Achilles tendon on the posterior calcaneus. IMPRESSION: 1. No acute osseous abnormality.  No evidence of osteomyelitis. 2. Osseous demineralization. 3. Mild osteoarthritis. Electronically Signed   By:  Evangeline Dakin M.D.   On: 03/19/2017 21:20   Dg Foot Complete Right  Result Date: 03/19/2017 CLINICAL DATA:  Erythema. Evaluate for possible osteomyelitis. Personal history of left foot osteomyelitis in 2016. EXAM: RIGHT FOOT COMPLETE - 3+ VIEW COMPARISON:  None. FINDINGS: Soft tissue ulceration with gas in the tissues of the plantar surface underlying the head of the first metatarsal. No evidence of osteomyelitis. No evidence of acute fracture or dislocation.  Osseous demineralization. Remote fracture involving the head of the third metatarsal with incomplete union. Hammertoe deformity. Joint space narrowing involving the IP joints of the toes and joints of the midfoot. IMPRESSION: 1. Soft tissue ulceration with gas in the tissues of the plantar surface of the foot underlying the head of the first metatarsal. No evidence of osteomyelitis. 2. Remote fracture involving the head of the third metatarsal with incomplete union. 3. Mild osteoarthritis. 4. Osseous demineralization. Electronically Signed   By: Evangeline Dakin M.D.   On: 03/19/2017 21:24   [place] Ekg Site Rite  Result Date: 03/23/2017 If Site Rite image not attached, placement could not be confirmed due to current cardiac rhythm.   Assessment/Plan Vannary was seen today for medical management of chronic issues.  Diagnoses and all orders for this visit:  Diabetic foot infection ([place])  Diabetic polyneuropathy associated with type 2 diabetes mellitus ([place])  Skin infection   Prior to entering orders in the computer, son notified the social worker that the patient was to be admitted to [facility]  Above issues to be addressed by podiatry/hospitalist at [facility] line in place  Patient continues on Zosyn 3.375 g IV every 6 hours  Family/ staff Communication:   Total Time:  Documentation:  Face to Face:  Family/Phone:   Labs/tests ordered:    Medication list reviewed and assessed for continued appropriateness. Monthly medication  orders reviewed and signed.  Vikki Ports, NP-C Geriatrics [facility] [phone], [place] [id number] Cell Phone (Mon-Fri 8am-5pm):  [phone] On Call:  [phone] & follow prompts after 5pm & weekends Office Phone:  [phone] Office Fax:  [phone]

## 2017-04-17 ENCOUNTER — Inpatient Hospital Stay: Payer: PPO

## 2017-04-17 ENCOUNTER — Encounter
Admission: AD | Disposition: A | Discharge status: Skilled Nursing Facility | Payer: Self-pay | Source: Ambulatory Visit | Attending: Internal Medicine

## 2017-04-17 ENCOUNTER — Encounter: Payer: Self-pay | Admitting: Anesthesiology

## 2017-04-17 DIAGNOSIS — I70212 Atherosclerosis of native arteries of extremities with intermittent claudication, left leg: Secondary | ICD-10-CM

## 2017-04-17 DIAGNOSIS — I70238 Atherosclerosis of native arteries of right leg with ulceration of other part of lower right leg: Secondary | ICD-10-CM

## 2017-04-17 HISTORY — PX: PERIPHERAL VASCULAR BALLOON ANGIOPLASTY: CATH118281

## 2017-04-17 LAB — GLUCOSE, CAPILLARY
GLUCOSE-CAPILLARY: 106 mg/dL — AB (ref 65–99)
Glucose-Capillary: 145 mg/dL — ABNORMAL HIGH (ref 65–99)
Glucose-Capillary: 188 mg/dL — ABNORMAL HIGH (ref 65–99)
Glucose-Capillary: 144 mg/dL — ABNORMAL HIGH (ref 65–99)

## 2017-04-17 SURGERY — PERIPHERAL VASCULAR BALLOON ANGIOPLASTY
Anesthesia: Moderate Sedation | Laterality: Right

## 2017-04-17 MED ORDER — HEPARIN SODIUM (PORCINE) 1000 UNIT/ML IJ SOLN
INTRAMUSCULAR | Status: AC
  Filled 2017-04-17: qty 1

## 2017-04-17 MED ORDER — ONDANSETRON HCL 4 MG/2ML IJ SOLN: 4.0000 mg | Freq: Four times a day (QID) | INTRAMUSCULAR | Status: DC | PRN

## 2017-04-17 MED ORDER — LIDOCAINE-EPINEPHRINE (PF) 1 %-1:200000 IJ SOLN
INTRAMUSCULAR | Status: AC
  Filled 2017-04-17: qty 30

## 2017-04-17 MED ORDER — HYDRALAZINE HCL 20 MG/ML IJ SOLN
INTRAMUSCULAR | Status: DC | PRN
  Administered 2017-04-17 (×2): 10 mg via INTRAVENOUS

## 2017-04-17 MED ORDER — SODIUM CHLORIDE 0.9 % IV SOLN
250.0000 mL | INTRAVENOUS | Status: DC | PRN
  Administered 2017-04-18: 08:00:00 via INTRAVENOUS

## 2017-04-17 MED ORDER — SODIUM CHLORIDE 0.9% FLUSH: 3.0000 mL | INTRAVENOUS | Status: DC | PRN

## 2017-04-17 MED ORDER — LIDOCAINE-EPINEPHRINE (PF) 1 %-1:200000 IJ SOLN
INTRAMUSCULAR | Status: AC
Start: 2017-04-17 — End: 2017-04-17
  Filled 2017-04-17: qty 30

## 2017-04-17 MED ORDER — HEPARIN (PORCINE) IN NACL 2-0.9 UNIT/ML-% IJ SOLN
INTRAMUSCULAR | Status: AC
  Filled 2017-04-17: qty 1000

## 2017-04-17 MED ORDER — SODIUM CHLORIDE 0.9% FLUSH
10.0000 mL | INTRAVENOUS | Status: AC | PRN
  Administered 2017-04-20: 10 mL

## 2017-04-17 MED ORDER — LABETALOL HCL 5 MG/ML IV SOLN: 10.0000 mg | INTRAVENOUS | Status: DC | PRN

## 2017-04-17 MED ORDER — FENTANYL CITRATE (PF) 100 MCG/2ML IJ SOLN
INTRAMUSCULAR | Status: AC
  Filled 2017-04-17: qty 2

## 2017-04-17 MED ORDER — IOPAMIDOL (ISOVUE-300) INJECTION 61%
INTRAVENOUS | Status: DC | PRN
  Administered 2017-04-17: 80 mL via INTRAVENOUS

## 2017-04-17 MED ORDER — OXYCODONE HCL 5 MG PO TABS
5.0000 mg | ORAL_TABLET | ORAL | Status: DC | PRN
  Filled 2017-04-17: qty 1

## 2017-04-17 MED ORDER — SODIUM CHLORIDE 0.9 % IV SOLN: INTRAVENOUS | Status: AC

## 2017-04-17 MED ORDER — HYDRALAZINE HCL 20 MG/ML IJ SOLN: 5.0000 mg | INTRAMUSCULAR | Status: DC | PRN

## 2017-04-17 MED ORDER — ACETAMINOPHEN 325 MG PO TABS: 650.0000 mg | ORAL_TABLET | ORAL | Status: DC | PRN

## 2017-04-17 MED ORDER — SODIUM CHLORIDE 0.9% FLUSH
3.0000 mL | Freq: Two times a day (BID) | INTRAVENOUS | Status: DC
  Administered 2017-04-18 – 2017-04-21 (×5): 3 mL via INTRAVENOUS

## 2017-04-17 MED ORDER — HEPARIN SODIUM (PORCINE) 1000 UNIT/ML IJ SOLN
INTRAMUSCULAR | Status: DC | PRN
  Administered 2017-04-17: 4000 [IU] via INTRAVENOUS

## 2017-04-17 MED ORDER — HYDRALAZINE HCL 20 MG/ML IJ SOLN
INTRAMUSCULAR | Status: AC
  Filled 2017-04-17: qty 1

## 2017-04-17 MED ORDER — MIDAZOLAM HCL 5 MG/5ML IJ SOLN
INTRAMUSCULAR | Status: AC
  Filled 2017-04-17: qty 5

## 2017-04-17 MED ORDER — TRAMADOL HCL 50 MG PO TABS
ORAL_TABLET | ORAL | Status: AC
  Filled 2017-04-17: qty 1

## 2017-04-17 MED ORDER — LIDOCAINE HCL (PF) 1 % IJ SOLN
INTRAMUSCULAR | Status: AC
  Filled 2017-04-17: qty 30

## 2017-04-17 MED ORDER — MIDAZOLAM HCL 2 MG/2ML IJ SOLN
INTRAMUSCULAR | Status: DC | PRN
  Administered 2017-04-17: 0.5 mg via INTRAVENOUS
  Administered 2017-04-17: 2 mg via INTRAVENOUS
  Administered 2017-04-17: 0.5 mg via INTRAVENOUS

## 2017-04-17 MED ORDER — FENTANYL CITRATE (PF) 100 MCG/2ML IJ SOLN
INTRAMUSCULAR | Status: DC | PRN
  Administered 2017-04-17: 25 ug via INTRAVENOUS
  Administered 2017-04-17: 50 ug via INTRAVENOUS
  Administered 2017-04-17: 25 ug via INTRAVENOUS

## 2017-04-17 MED ORDER — BACITRACIN-NEOMYCIN-POLYMYXIN 400-5-5000 EX OINT
TOPICAL_OINTMENT | CUTANEOUS | Status: AC
  Filled 2017-04-17: qty 1

## 2017-04-17 MED ORDER — MORPHINE SULFATE (PF) 4 MG/ML IV SOLN
2.0000 mg | INTRAVENOUS | Status: DC | PRN
  Administered 2017-04-20: 2 mg via INTRAVENOUS
  Filled 2017-04-17: qty 1

## 2017-04-17 SURGICAL SUPPLY — 24 items
CANNULA 5F STIFF (CANNULA) ×3 IMPLANT
CATH BEACON 5 .035 65 KMP TIP (CATHETERS) ×3 IMPLANT
CATH BEACON 5 .035 65 RIM TIP (CATHETERS) ×3 IMPLANT
CATH IMAGER II S 5FR 65CM (CATHETERS) ×3 IMPLANT
CATH PIG 70CM (CATHETERS) ×3 IMPLANT
COVER PROBE U/S 5X48 (MISCELLANEOUS) ×3 IMPLANT
DEVICE PRESTO INFLATION (MISCELLANEOUS) ×6 IMPLANT
DEVICE SAFEGUARD 24CM (GAUZE/BANDAGES/DRESSINGS) ×6 IMPLANT
DEVICE STARCLOSE SE CLOSURE (Vascular Products) ×6 IMPLANT
DEVICE TORQUE .025-.038 (MISCELLANEOUS) ×3 IMPLANT
GUIDEWIRE ZIPWIRE .035X180CM (WIRE) ×3 IMPLANT
NEEDLE ENTRY 21GA 7CM ECHOTIP (NEEDLE) ×3 IMPLANT
PACK ANGIOGRAPHY (CUSTOM PROCEDURE TRAY) ×3 IMPLANT
SET INTRO CAPELLA COAXIAL (SET/KITS/TRAYS/PACK) ×3 IMPLANT
SHEATH BRITE TIP 5FRX11 (SHEATH) ×3 IMPLANT
SHEATH BRITE TIP 6FRX11 (SHEATH) ×3 IMPLANT
SHEATH BRITE TIP 7FRX11 (SHEATH) ×6 IMPLANT
STENT LIFESTREAM 7X37X80 (Permanent Stent) ×6 IMPLANT
STENT LIFESTREAM 7X58X80 (Permanent Stent) ×3 IMPLANT
TOWEL OR 17X26 4PK STRL BLUE (TOWEL DISPOSABLE) ×3 IMPLANT
TUBING CONTRAST HIGH PRESS 72 (TUBING) ×3 IMPLANT
WIRE AQUATRACK .035X260CM (WIRE) ×3 IMPLANT
WIRE J 3MM .035X145CM (WIRE) ×3 IMPLANT
WIRE MAGIC TOR.035 180C (WIRE) ×6 IMPLANT

## 2017-04-17 NOTE — Progress Notes (Signed)
Pt's abdomen palpated and noted to be distended, pt reports pressure.  Pt's BP remains low compared to her baseline.  Dr. Delana Meyer informed by Ivin Booty, RN, VORB for stat CT abd w/o contrast

## 2017-04-17 NOTE — Progress Notes (Signed)
Clinical Education officer, museum (CSW) started [employer] [facility] authorization via telephone.   [employer], LCSW [phone]

## 2017-04-17 NOTE — Progress Notes (Signed)
[facility] [place] NAME: Sharon [name]    MR#:  [id number]  DATE OF BIRTH:  Jan 12, 1934  SUBJECTIVE: Admitted for nonhealing diabetic ulcers on the right foot.  Status post angiogram today by vascular, 2 stents placed in the left leg, one stent placed in the right leg.  CHIEF COMPLAINT:  No chief complaint on file.   REVIEW OF SYSTEMS:    Review of Systems  Constitutional: Negative for chills and fever.  HENT: Negative for hearing loss.   Eyes: Negative for blurred vision, double vision and photophobia.  Respiratory: Negative for cough, hemoptysis and shortness of breath.   Cardiovascular: Negative for palpitations, orthopnea and leg swelling.  Gastrointestinal: Negative for abdominal pain, diarrhea and vomiting.  Genitourinary: Negative for dysuria and urgency.  Musculoskeletal: Negative for myalgias and neck pain.  Skin: Negative for rash.  Neurological: Negative for dizziness, focal weakness, seizures, weakness and headaches.  Psychiatric/Behavioral: Negative for memory loss. The patient does not have insomnia.   Patient has chronic ulcers in the right leg dressing dressing present.    Dressing present in the right leg tolerating Diet: Tolerating PT:      DRUG ALLERGIES:   Allergies  Allergen Reactions  . Carbamazepine Other (See Comments)    Reaction: unknown  . Cymbalta [Duloxetine Hcl] Other (See Comments)    Confusion, disorientation  . Duloxetine     Other reaction(s): Other (See Comments) Confusion, disorientation  . Lyrica [Pregabalin] Other (See Comments)    Patient and son states this makes the patient confused.    VITALS:  Blood pressure (!) 122/44, pulse (!) 57, temperature 97.8 F (36.6 C), temperature source Oral, resp. rate 18, height 5\' 5"  (1.651 m), weight 64 kg (141 lb), SpO2 98 %.  PHYSICAL EXAMINATION:   Physical Exam  GENERAL:  84 y.o.-year-old patient lying in the bed with no acute  distress.  EYES: Pupils equal, round, reactive to light and accommodation. No scleral icterus. Extraocular muscles intact.  HEENT: Head atraumatic, normocephalic. Oropharynx and nasopharynx clear.  NECK:  Supple, no jugular venous distention. No thyroid enlargement, no tenderness.  LUNGS: Normal breath sounds bilaterally, no wheezing, rales,rhonchi or crepitation. No use of accessory muscles of respiration.  CARDIOVASCULAR: S1, S2 normal. No murmurs, rubs, or gallops.  ABDOMEN: Soft, nontender, nondistended. Bowel sounds present. No organomegaly or mass.  EXTREMITIES: dressing present on the right leg.   NEUROLOGIC: Cranial nerves II through XII are intact. Muscle strength 5/5 in all extremities. Sensation intact. Gait not checked.  PSYCHIATRIC: The patient is alert and oriented x 3.  SKIN;  Patient has dressing present in the right leg. LABORATORY PANEL:   CBC Recent Labs  Lab 04/16/17 1727  WBC 6.5  HGB 11.2*  HCT 35.2  PLT 231   ------------------------------------------------------------------------------------------------------------------  Chemistries  Recent Labs  Lab 04/16/17 1727  NA 137  K 4.3  CL 105  CO2 26  GLUCOSE 140*  BUN 31*  CREATININE 1.24*  CALCIUM 9.0   ------------------------------------------------------------------------------------------------------------------  Cardiac Enzymes No results for input(s): TROPONINI in the last 168 hours. ------------------------------------------------------------------------------------------------------------------  RADIOLOGY:  Ct Abdomen Pelvis Wo Contrast  Result Date: 04/17/2017 CLINICAL DATA:  Abdominal distension and decreased blood pressure following lower extremity angiography and bilateral common iliac artery stenting today. EXAM: CT ABDOMEN AND PELVIS WITHOUT CONTRAST TECHNIQUE: Multidetector CT imaging of the abdomen and pelvis was performed following the standard protocol without IV contrast.  COMPARISON:  01/21/2017 FINDINGS: Lower chest: Mild motion artifact.  Subsegmental atelectasis in the lung bases. No pleural effusion. Coronary artery atherosclerosis. Hepatobiliary: No focal liver abnormality is identified. There is no significant biliary dilatation status post cholecystectomy. Pancreas: 3.3 x 2.8 cm low-density/cystic mass in the pancreatic body is minimally larger than on the prior study. Smaller cystic lesions more distally in the pancreatic body and tail are similar to the prior study measuring up to 2.2 cm in size. No acute peripancreatic inflammatory changes. Spleen: Unremarkable. Adrenals/Urinary Tract: Unremarkable adrenal glands. Excreted contrast in the renal collecting systems and bladder. No hydronephrosis. Unchanged left lower pole renal scarring. Unchanged 1.3 cm left upper pole renal cyst and 8 mm left upper pole angiomyolipoma. Subcentimeter hypodense lesions in both kidneys are too small to fully characterize. Stomach/Bowel: The stomach is within normal limits. There is no evidence of bowel obstruction. A moderately large amount of stool is present in the rectum. Prior appendectomy. Vascular/Lymphatic: Abdominal aortic atherosclerosis without aneurysm. Bilateral common iliac artery stents extending into the distal abdominal aorta. No retroperitoneal hemorrhage about the abdominal aorta or iliac artery stents. Mildly prominent gastrohepatic and periportal lymph nodes measure up to 10 mm in short axis and are overall stable to minimally larger. Reproductive: Limited assessment due to artifact from bilateral hip arthroplasties. There is likely an atrophic uterus. Ovaries are grossly unremarkable. Other: No intraperitoneal free fluid. Small to moderate volume hematoma in the right inguinal region extending to the inferior aspect of the iliac fossa, partially obscured by streak artifact. Subcutaneous stranding extends inferiorly and medially into the groin. Musculoskeletal: Old lower  anterior rib fractures on the right. Bilateral hip arthroplasties. Old fractures of the inferior left pubic ramus and left ilium extending to the SI joint. Diffuse thoracolumbar disc degeneration. IMPRESSION: 1. Small to moderate volume hematoma in the right inguinal region mildly extending into the iliac fossa. 2. Stable to minimally increased size of multilocular cystic pancreatic body lesion with similar appearance of smaller cystic lesions in the pancreatic tail. Abdominal MRI is recommended on a nonemergent basis when the patient is able to follow breath hold instructions. 3. Stable to minimally increased size of upper abdominal lymph nodes. 4.  Aortic Atherosclerosis (ICD10-I70.0). Electronically Signed   By: Logan Bores M.D.   On: 04/17/2017 16:27     ASSESSMENT AND PLAN:   Active Problems:   Non-healing ulcer ([place]) Nonhealing ulcers of the right leg due to diabetes, seen by podiatry, patient is on vancomycin, Zosyn, status post angiogram with of both legs with angioplasty and stent placement, .DM-2 -Insulin and SSI  3. HTN cont home meds  4. Chronic diastolic congestive heart failure-- compensated  5. Paroxysmal Afib-on amiodarone -patient not on anticoagulation,  Cont aspirin      All the records are reviewed and case discussed with Care Management/Social Workerr. Management plans discussed with the patient, family and they are in agreement.  CODE STATUS: full TOTAL TIME TAKING CARE OF THIS PATIENT: 35 minutes.   POSSIBLE D/C IN 1-2 DAYS, DEPENDING ON CLINICAL CONDITION. D/c to [facility] ready  Epifanio Lesches M.D on 04/17/2017 at 5:22 PM  Between 7am to 6pm - Pager - [phone]  After 6pm go to [email or web address] - password [email or web address] [facility]  Office  [phone]  CC: Primary care physician; Rusty Aus, MD admitted for nonhealing ulcers

## 2017-04-17 NOTE — Progress Notes (Signed)
Per [facility] admissions coordinator at [facility] patient's son [name] does want to do a bed hold. Per Sharyn Lull she can't guarantee patient a bed. CSW will follow up with Sharyn Lull on Monday 04/20/17.   [employer], LCSW [phone]

## 2017-04-17 NOTE — Consult Note (Signed)
[facility]                                                      Patient Demographics  Sharon [name], is a 84 y.o. female   MRN: [id number]   DOB - [birth year]  Admit Date - 04/16/2017    Outpatient Primary MD for the patient is Rusty Aus, MD  Consult requested in the Hospital by Epifanio Lesches, MD, On 04/17/2017    Reason for consult nonhealing diabetic ulcer plantar right foot   With History of -  Past Medical History:  Diagnosis Date  . Anemia   . Atrial fibrillation ([place])   . Cardiac arrest ([place])   . Cataract   . CHF (congestive heart failure) ([place])   . Diabetes mellitus without complication ([place])   . Edema    feet/ankles occas  . Hip fracture ([place])   . Hyperlipidemia   . Hypertension   . Hypothyroid   . Neuropathy   . Osteomyelitis ([place])    left first metatarsal  . Stroke [facility])       Past Surgical History:  Procedure Laterality Date  . ACHILLES TENDON SURGERY Left 02/08/2015   Procedure: ACHILLES LENGTHENING/KIDNER;  Surgeon: Albertine Patricia, DPM;  Location: [facility] ORS;  Service: Podiatry;  Laterality: Left;  . APPENDECTOMY    . CARDIAC CATHETERIZATION  08/25/13  . CATARACT EXTRACTION W/PHACO Left 07/20/2014   Procedure: CATARACT EXTRACTION PHACO AND INTRAOCULAR LENS PLACEMENT (IOC);  Surgeon: Leandrew Koyanagi, MD;  Location: [facility] ORS;  Service: Ophthalmology;  Laterality: Left;  [place]  1:18                 AP     23.6             CDE   9.69      lot #[phone]  . CHOLECYSTECTOMY    . CORONARY ANGIOPLASTY    . HALLUX VALGUS AKIN Left 02/08/2015   Procedure: HALLUX VALGUS AKIN/ KELLER;  Surgeon: Albertine Patricia, DPM;  Location: [facility] ORS;  Service: Podiatry;  Laterality: Left;  IVA with Local needs 1 hour for this case   . HEMIARTHROPLASTY HIP Right   . HEMIARTHROPLASTY HIP Left   . INCISION AND DRAINAGE Left 10/11/2014   Procedure: Removal of infected tibial sessmoid;  Surgeon: Albertine Patricia, DPM;  Location: [facility] ORS;  Service: Podiatry;  Laterality: Left;  . IRRIGATION AND DEBRIDEMENT FOOT Right 03/21/2017   Procedure: IRRIGATION AND DEBRIDEMENT FOOT right great toe amputation;  Surgeon: Sharlotte Alamo, DPM;  Location: [facility] ORS;  Service: Podiatry;  Laterality: Right;    in for   No chief complaint on file.    HPI  Sharon [name]  is a 84 y.o. female, patient had a first ray amputation to the right foot approximately a month ago.  This is stable office severe infection that she had gradually she started showing signs of wound regression on the plantar aspect of the foot.  She was seen in my office yesterday and I recommend she come to the hospital for admission vascular workup and further surgery by me to clean up and try to close the wound.    Review of Systems patient is a little groggy at present due to having a vascular procedure done today.  In addition to the HPI above,  No Fever-chills,  No Headache, No changes with Vision or hearing, No problems swallowing food or Liquids, No Chest pain, Cough or Shortness of Breath, No Abdominal pain, No Nausea or Vommitting, Bowel movements are regular, No Blood in stool or Urine, No dysuria, No new skin rashes or bruises, No new joints pains-aches,  No new weakness, tingling, numbness in any extremity, No recent weight gain or loss, No polyuria, polydypsia or polyphagia, No significant Mental Stressors.  A full 10 point Review of Systems was done, except as stated above, all other Review of Systems were negative.   Social History Social History   Tobacco Use  . Smoking status: Former Smoker    Packs/day: 0.50    Years: 7.00    Pack years: 3.50    Types: Cigarettes    Last attempt to quit: 05/30/1989    Years since quitting: 27.9  . Smokeless tobacco: Never Used  Substance Use Topics  . Alcohol use: No    Alcohol/week: 0.0 oz    Family  History Family History  Problem Relation Age of Onset  . Heart attack Mother     Prior to Admission medications   Medication Sig Start Date End Date Taking? Authorizing Provider  acetaminophen (TYLENOL) 500 MG tablet Take 500 mg by mouth every 4 (four) hours as needed for mild pain or moderate pain.   Yes [provider]  Amino Acids-Protein Hydrolys (FEEDING SUPPLEMENT, PRO-STAT SUGAR FREE 64,) LIQD Take 30 mLs by mouth 2 (two) times daily between meals.   Yes [provider]  amiodarone (PACERONE) 200 MG tablet Take 1 tablet (200 mg total) by mouth daily. 03/24/17  Yes Gladstone Lighter, MD  amitriptyline (ELAVIL) 10 MG tablet Take 10 mg by mouth at bedtime.   Yes [provider]  aspirin 81 MG tablet Take 81 mg by mouth daily.    Yes [provider]  cholecalciferol (VITAMIN D) 1000 units tablet Take 1,000 Units by mouth daily.   Yes [provider]  ferrous sulfate 325 (65 FE) MG tablet Take 325 mg by mouth 2 (two) times daily.   Yes [provider]  furosemide (LASIX) 20 MG tablet Take 1 tablet (20 mg total) by mouth daily. 03/23/17  Yes Gladstone Lighter, MD  gabapentin (NEURONTIN) 100 MG capsule Take 100 mg by mouth 4 (four) times daily.   Yes [provider]  gabapentin (NEURONTIN) 600 MG tablet Take 600 mg by mouth 4 (four) times daily.    Yes [provider]  glimepiride (AMARYL) 2 MG tablet Take 2 mg by mouth daily with breakfast.    Yes [provider]  levothyroxine (SYNTHROID, LEVOTHROID) 112 MCG tablet Take 112 mcg by mouth daily before breakfast. 30 minutes before breakfast   Yes [provider]  losartan (COZAAR) 25 MG tablet Take 25 mg by mouth 2 (two) times daily.   Yes [provider]  Multiple Vitamins-Minerals (PRESERVISION AREDS 2+MULTI VIT PO) Take 1 capsule by mouth 2 (two) times daily.    Yes [provider]  nystatin cream (MYCOSTATIN) Apply 1 application  topically 2 (two) times daily.   Yes [provider]  oxyCODONE (OXY IR/ROXICODONE) 5 MG immediate release tablet Take 1 tablet (5 mg total) by mouth every 4 (four) hours as needed for moderate pain or severe pain. 03/23/17  Yes Gladstone Lighter, MD  piperacillin-tazobactam (ZOSYN) IVPB Inject 3.375 g into the vein every 6 (six) hours.   Yes [provider]  potassium chloride (MICRO-K) 10 MEQ CR  capsule Take 10 mEq by mouth daily.   Yes [provider]  senna-docusate (SENOKOT-S) 8.6-50 MG tablet Take 1 tablet by mouth 2 (two) times daily. 03/23/17  Yes Gladstone Lighter, MD  simvastatin (ZOCOR) 20 MG tablet Take 20 mg by mouth daily.   Yes [provider]  Skin Protectants, Misc. (ENDIT EX) Apply 1 application topically 2 (two) times daily.   Yes [provider]  traMADol (ULTRAM) 50 MG tablet Take 50 mg by mouth every 6 (six) hours as needed.   Yes [provider]  vitamin C (ASCORBIC ACID) 500 MG tablet Take 500 mg by mouth 2 (two) times daily.   Yes [provider]  bisacodyl (DULCOLAX) 5 MG EC tablet Take 2 tablets (10 mg total) by mouth every other day. 03/25/17   Gladstone Lighter, MD  GLUCERNA (GLUCERNA) LIQD Take 237 mLs by mouth 2 (two) times daily between meals.    [provider]  metoprolol tartrate (LOPRESSOR) 25 MG tablet Take 1 tablet (25 mg total) by mouth 2 (two) times daily. 03/23/17   Gladstone Lighter, MD    Anti-infectives (From admission, onward)   Start     Dose/Rate Route Frequency Ordered Stop   04/17/17 0700  [MAR Hold]  ceFAZolin (ANCEF) IVPB 1 g/50 mL premix     (MAR Hold since Fri 04/17/2017 at 0847. Reason: Transfer to a Procedural area.)  Note to Pharmacy:  Send with pt to OR   1 g 100 mL/hr over 30 Minutes Intravenous On call 04/16/17 1905 04/18/17 0700   04/16/17 1700  [MAR Hold]  piperacillin-tazobactam (ZOSYN) IVPB 3.375 g     (MAR Hold since Fri 04/17/2017 at 0847. Reason: Transfer to a  Procedural area.)   3.375 g 12.5 mL/hr over 240 Minutes Intravenous Every 8 hours 04/16/17 1650        Scheduled Meds: . [MAR Hold] amiodarone  200 mg Oral Daily  . [MAR Hold] amitriptyline  10 mg Oral QHS  . [MAR Hold] aspirin EC  81 mg Oral Daily  . [MAR Hold] bisacodyl  10 mg Oral QODAY  . [MAR Hold] cholecalciferol  1,000 Units Oral Daily  . [MAR Hold] ferrous sulfate  325 mg Oral BID  . [MAR Hold] furosemide  20 mg Oral Daily  . [MAR Hold] gabapentin  100 mg Oral QID  . [MAR Hold] gabapentin  600 mg Oral QID  . [MAR Hold] glimepiride  2 mg Oral Q breakfast  . [MAR Hold] GLUCERNA  237 mL Oral BID BM  . [MAR Hold] insulin aspart  0-9 Units Subcutaneous TID WC  . [MAR Hold] levothyroxine  112 mcg Oral QAC breakfast  . [MAR Hold] losartan  25 mg Oral BID  . [MAR Hold] metoprolol tartrate  25 mg Oral BID  . [MAR Hold] multivitamin-lutein  1 capsule Oral BID  . [MAR Hold] mupirocin ointment  1 application Nasal BID  . [MAR Hold] senna-docusate  1 tablet Oral BID  . [MAR Hold] simvastatin  20 mg Oral Daily  . [MAR Hold] sodium chloride flush  10-40 mL Intracatheter Q12H  . traMADol      . [MAR Hold] vitamin C  500 mg Oral BID   Continuous Infusions: . sodium chloride 75 mL/hr at 04/17/17 0026  . [MAR Hold]  ceFAZolin (ANCEF) IV 1 g (04/17/17 1205)  . [MAR Hold] piperacillin-tazobactam (ZOSYN)  IV 3.375 g (04/17/17 8182)   PRN Meds:.[MAR Hold] acetaminophen **OR** [MAR Hold] acetaminophen, fentaNYL, [MAR Hold] hydrALAZINE, midazolam, [MAR  Hold] ondansetron **OR** [MAR Hold] ondansetron (ZOFRAN) IV, [MAR Hold] oxyCODONE, [MAR Hold] senna-docusate, [MAR Hold] sodium chloride flush, [MAR Hold] traMADol, [MAR Hold] zolpidem  Allergies  Allergen Reactions  . Carbamazepine Other (See Comments)    Reaction: unknown  . Cymbalta [Duloxetine Hcl] Other (See Comments)    Confusion, disorientation  . Duloxetine     Other reaction(s): Other (See Comments) Confusion, disorientation  .  Lyrica [Pregabalin] Other (See Comments)    Patient and son states this makes the patient confused.    Physical Exam  Vitals  Blood pressure (!) 175/76, pulse (!) 52, temperature 97.7 F (36.5 C), temperature source Oral, resp. rate 20, height '5\' 5"'$  (1.651 m), weight 64 kg (141 lb), SpO2 99 %.  Lower Extremity exam:  Vascular: Nonpalpable to the right foot.  Left foot also has diminished pulses has a history of ulceration there are 2.  Dermatological: There is a healed dorsal incision of the first metatarsal but the sub-met one area is not healed shows signs of continued drainage and poor quality tissues in the region.  There is an open area approximately 2-1/2-3 cm diameter with deep penetration approximately 2-3 cm.  There are some antibiotic beads present in the wound that are gradually resolving.  Does not appear to be any type of redness or cellulitis to the area.  Dorsal incision is well-healed.  Neurological: Pronounced peripheral neuropathy bilateral  Ortho: First ray amputation right foot approximately 4-5 weeks ago.  Data Review  CBC Recent Labs  Lab 04/16/17 1727  WBC 6.5  HGB 11.2*  HCT 35.2  PLT 231  MCV 81.5  MCH 26.0  MCHC 31.9*  RDW 23.8*   ------------------------------------------------------------------------------------------------------------------  Chemistries  Recent Labs  Lab 04/16/17 1727  NA 137  K 4.3  CL 105  CO2 26  GLUCOSE 140*  BUN 31*  CREATININE 1.24*  CALCIUM 9.0   ------------------------------------------------------------------------------Urinalysis    Component Value Date/Time   COLORURINE YELLOW (A) 03/19/2017 2020   APPEARANCEUR HAZY (A) 03/19/2017 2020   APPEARANCEUR Hazy 08/18/2013 0253   LABSPEC 1.012 03/19/2017 2020   LABSPEC 1.012 08/18/2013 0253   PHURINE 5.0 03/19/2017 2020   GLUCOSEU 150 (A) 03/19/2017 2020   GLUCOSEU >=500 08/18/2013 0253   HGBUR SMALL (A) 03/19/2017 2020   BILIRUBINUR NEGATIVE 03/19/2017  2020   BILIRUBINUR Negative 08/18/2013 [place] 03/19/2017 2020   PROTEINUR >=300 (A) 03/19/2017 2020   NITRITE NEGATIVE 03/19/2017 2020   LEUKOCYTESUR NEGATIVE 03/19/2017 2020   LEUKOCYTESUR Trace 08/18/2013 0253     Imaging results:   No results found.  Assessment & Plan: I will get the patient scheduled for surgery tomorrow morning to debride and clean out the wound.  If circulation is appropriate I will try to close it primarily if not he may have to consider a wound VAC.  She has had an angiography today and hopefully will be able to improve her flow to this region.  Active Problems:   Non-healing ulcer ([facility])   Albertine Patricia M.D on 04/17/2017 at 12:27 PM  Thank you for the consult, we will follow the patient with you in the Hospital.

## 2017-04-17 NOTE — Op Note (Signed)
[facility]  Percutaneous Study/Intervention Procedural Note   Date of Surgery: 04/17/2017  Surgeon:Schnier, Dolores Lory   Pre-operative Diagnosis: Atherosclerotic occlusive disease bilateral lower extremities with ulceration of the right foot  Post-operative diagnosis:  Same  Procedure(s) Performed:  1.  Abdominal aortogram  2.  Right lower extremity distal runoff  3.  Percutaneous transluminal angioplasty and stent placement right common iliac artery; "kissing balloon" technique  4.  Percutaneous transluminal and plasty and stent placement left common iliac artery; "kissing balloon" technique  5.  Ultrasound guided access bilateral common femoral arteries  6.  StarClose closure device bilateral common femoral arteries  Anesthesia: Conscious sedation was administered under my direct supervision by the interventional radiology RN. IV Versed plus fentanyl were utilized. Continuous ECG, pulse oximetry and blood pressure was monitored throughout the entire procedure. Conscious sedation was for a total of 1 hour 15 minutes.  Sheath: 7 French sheath bilateral common femoral arteries  Contrast: 80 cc  Fluoroscopy Time: 13.9 minutes  Indications: Patient presents with nonhealing wounds of her right foot.  She has a history of atherosclerotic occlusive disease and has had interventions in the past.  Risks and benefits for angiography and possible intervention to optimize blood flow for wound healing was discussed with the patient all questions have been answered patient agrees to proceed.  Procedure:  Sharon [name] is a 84 y.o. female who was identified and appropriate procedural time out was performed.  The patient was then placed supine on the table and prepped and draped in the usual sterile fashion.  Ultrasound was used to evaluate the left common femoral artery.  It was echolucent and pulsatile indicating it is patent .  An ultrasound image was acquired for the  permanent record.  A micropuncture needle was used to access the left common femoral artery under direct ultrasound guidance.  The microwire was then advanced under fluoroscopic guidance without difficulty followed by the micro-sheath  A 0.035 J wire was advanced without resistance and a 5Fr sheath was placed.    The pigtail catheter was then positioned at the level of T12 and an AP image of the aorta was obtained. After review the images the pigtail catheter was repositioned above the aortic bifurcation and bilateral oblique views of the pelvis were obtained.   After review the images the ultrasound was reprepped and delivered back onto the sterile field. The right common femoral was then imaged with the ultrasound it was noted to be echolucent and pulsatile indicating patency. Images recorded for the permanent record. Under real-time visualization a microneedle was inserted into the anterior wall the common femoral artery microwire was then advanced without difficulty under fluoroscopic guidance followed by placement of the micro-sheath.  A J-wire wire was then negotiated under fluoroscopic guidance into the aorta. 6 French sheath was then placed.  Hand-injection of contrast through the right femoral sheath was then used to complete right lower extremity distal runoff.  4000 units of heparin was given and allowed to circulate for proximally 4 minutes.  The bilateral femoral sheaths were then upsized to a 7 [place] sheath as well after a Magic torque wire was advanced through the pigtail catheter. Magnified images of the aortic bifurcation were then made using hand injection contrast from the femoral sheaths. After appropriate sizing a 7 mm x 58 mm Lifestream stent was selected for the right and a 7 mm x 39 mm Lifestream stent was selected for the left. There were then advanced and positioned  just above the aortic bifurcation. Insufflation for full expansion of the stents was performed simultaneously.  A  second 7 mm x 39 mm Lifestream stent was then advanced up the left overlapping the distal edge of the initial stent and extending the angioplasty site to the mid left common femoral.  Both inflations were to 12 atm for approximately 30 seconds.  Follow-up imaging was then performed and the aortic bifurcation and common iliac arteries are widely patent..  The pigtail catheter was then introduced up the right and bolus injection of contrast was used to perform final imaging of the distal aortic reconstruction.  Oblique views were then obtained of the groins in succession and Star close device is deployed without difficulty. There were no immediate complications   Findings:   Aortogram:  The abdominal aorta is opacified with a bolus injection contrast. Demonstrates diffuse disease but there are no hemodynamically significant lesions noted until the distal aortic bifurcation where bilateral greater than 70% ostial iliac lesions are identified. There is moderate poststenotic dilatation noted of the common iliac arteries as well.  Right Lower Extremity: The external iliac artery on the right is widely patent.  The common femoral profunda femoris superficial femoral and popliteal arteries are patent although there is diffuse disease.  There does not appear to be any lesion greater than 40%.  Trifurcation is patent and there is anterior tibial and posterior tibial runoff to the foot filling the pedal arch.  Left Lower Extremity: External iliac artery demonstrates severe tortuosity some spasm is noted but it is patent.  The left common femoral profunda femoris and superficial femoral arteries appear patent and the visualized segments.  Following placement of the iliac stents there is now wide patency with less than 5% residual stenosis with rapid flow through the aortic bifurcation bilaterally.  Summary:  Successful reconstruction of the distal aorta and bilateral iliac arteries  Disposition: Patient was  taken to the recovery room in stable condition having tolerated the procedure well.  Belenda Cruise Schnier 04/17/2017,3:13 PM

## 2017-04-18 ENCOUNTER — Encounter
Admission: AD | Disposition: A | Discharge status: Skilled Nursing Facility | Payer: Self-pay | Source: Ambulatory Visit | Attending: Internal Medicine

## 2017-04-18 ENCOUNTER — Inpatient Hospital Stay: Payer: PPO | Admitting: Anesthesiology

## 2017-04-18 ENCOUNTER — Encounter: Payer: Self-pay | Admitting: Podiatry

## 2017-04-18 HISTORY — PX: IRRIGATION AND DEBRIDEMENT FOOT: SHX6602

## 2017-04-18 LAB — GLUCOSE, CAPILLARY
GLUCOSE-CAPILLARY: 294 mg/dL — AB (ref 65–99)
GLUCOSE-CAPILLARY: 306 mg/dL — AB (ref 65–99)
Glucose-Capillary: 118 mg/dL — ABNORMAL HIGH (ref 65–99)
Glucose-Capillary: 173 mg/dL — ABNORMAL HIGH (ref 65–99)

## 2017-04-18 SURGERY — IRRIGATION AND DEBRIDEMENT FOOT
Anesthesia: General | Laterality: Right | Wound class: "Dirty or Infected "

## 2017-04-18 MED ORDER — PHENYLEPHRINE HCL 10 MG/ML IJ SOLN
INTRAMUSCULAR | Status: DC | PRN
  Administered 2017-04-18: 100 ug via INTRAVENOUS

## 2017-04-18 MED ORDER — ONDANSETRON HCL 4 MG/2ML IJ SOLN
INTRAMUSCULAR | Status: AC
  Filled 2017-04-18: qty 2

## 2017-04-18 MED ORDER — FENTANYL CITRATE (PF) 100 MCG/2ML IJ SOLN
INTRAMUSCULAR | Status: AC
  Filled 2017-04-18: qty 2

## 2017-04-18 MED ORDER — PROPOFOL 10 MG/ML IV BOLUS
INTRAVENOUS | Status: AC
  Filled 2017-04-18: qty 20

## 2017-04-18 MED ORDER — OXYCODONE HCL 5 MG PO TABS: 5.0000 mg | ORAL_TABLET | Freq: Once | ORAL | Status: DC | PRN

## 2017-04-18 MED ORDER — PROPOFOL 10 MG/ML IV BOLUS
INTRAVENOUS | Status: DC | PRN
  Administered 2017-04-18: 100 mg via INTRAVENOUS

## 2017-04-18 MED ORDER — DEXAMETHASONE SODIUM PHOSPHATE 10 MG/ML IJ SOLN
INTRAMUSCULAR | Status: DC | PRN
  Administered 2017-04-18: 5 mg via INTRAVENOUS

## 2017-04-18 MED ORDER — SUCCINYLCHOLINE CHLORIDE 20 MG/ML IJ SOLN
INTRAMUSCULAR | Status: AC
  Filled 2017-04-18: qty 1

## 2017-04-18 MED ORDER — FENTANYL CITRATE (PF) 100 MCG/2ML IJ SOLN: 25.0000 ug | INTRAMUSCULAR | Status: DC | PRN

## 2017-04-18 MED ORDER — EPHEDRINE SULFATE 50 MG/ML IJ SOLN
INTRAMUSCULAR | Status: DC | PRN
  Administered 2017-04-18 (×3): 5 mg via INTRAVENOUS
  Administered 2017-04-18: 10 mg via INTRAVENOUS

## 2017-04-18 MED ORDER — BUPIVACAINE HCL (PF) 0.5 % IJ SOLN
INTRAMUSCULAR | Status: DC | PRN
  Administered 2017-04-18: 7 mL

## 2017-04-18 MED ORDER — MEPERIDINE HCL 50 MG/ML IJ SOLN: 6.2500 mg | INTRAMUSCULAR | Status: DC | PRN

## 2017-04-18 MED ORDER — LIDOCAINE HCL (CARDIAC) 20 MG/ML IV SOLN
INTRAVENOUS | Status: DC | PRN
  Administered 2017-04-18: 60 mg via INTRAVENOUS

## 2017-04-18 MED ORDER — OXYCODONE HCL 5 MG/5ML PO SOLN: 5.0000 mg | Freq: Once | ORAL | Status: DC | PRN

## 2017-04-18 MED ORDER — FENTANYL CITRATE (PF) 100 MCG/2ML IJ SOLN
INTRAMUSCULAR | Status: DC | PRN
  Administered 2017-04-18 (×2): 25 ug via INTRAVENOUS

## 2017-04-18 MED ORDER — ONDANSETRON HCL 4 MG/2ML IJ SOLN
INTRAMUSCULAR | Status: DC | PRN
  Administered 2017-04-18: 4 mg via INTRAVENOUS

## 2017-04-18 MED ORDER — PROMETHAZINE HCL 25 MG/ML IJ SOLN: 6.2500 mg | INTRAMUSCULAR | Status: DC | PRN

## 2017-04-18 MED ORDER — LIDOCAINE HCL (PF) 2 % IJ SOLN
INTRAMUSCULAR | Status: AC
  Filled 2017-04-18: qty 10

## 2017-04-18 MED ORDER — DEXAMETHASONE SODIUM PHOSPHATE 10 MG/ML IJ SOLN
INTRAMUSCULAR | Status: AC
  Filled 2017-04-18: qty 1

## 2017-04-18 MED ORDER — LIDOCAINE HCL (PF) 1 % IJ SOLN
INTRAMUSCULAR | Status: AC
  Filled 2017-04-18: qty 30

## 2017-04-18 MED ORDER — BUPIVACAINE HCL (PF) 0.5 % IJ SOLN
INTRAMUSCULAR | Status: AC
  Filled 2017-04-18: qty 30

## 2017-04-18 SURGICAL SUPPLY — 41 items
BAG COUNTER SPONGE EZ (MISCELLANEOUS) ×2 IMPLANT
BANDAGE ELASTIC 3 LF NS (GAUZE/BANDAGES/DRESSINGS) ×1 IMPLANT
BANDAGE ELASTIC 4 LF NS (GAUZE/BANDAGES/DRESSINGS) ×3 IMPLANT
BNDG CONFORM 3 STRL LF (GAUZE/BANDAGES/DRESSINGS) ×1 IMPLANT
BNDG ESMARK 4X12 TAN STRL LF (GAUZE/BANDAGES/DRESSINGS) ×3 IMPLANT
BNDG GAUZE 4.5X4.1 6PLY STRL (MISCELLANEOUS) ×3 IMPLANT
CANISTER SUCT 1200ML W/VALVE (MISCELLANEOUS) ×5 IMPLANT
COUNTER SPONGE BAG EZ (MISCELLANEOUS) ×1
CUFF TOURN 18 STER (MISCELLANEOUS) ×1 IMPLANT
CUFF TOURN DUAL PL 12 NO SLV (MISCELLANEOUS) ×3 IMPLANT
DRAPE FLUOR MINI C-ARM 54X84 (DRAPES) ×1 IMPLANT
DURAPREP 26ML APPLICATOR (WOUND CARE) ×3 IMPLANT
ELECT REM PT RETURN 9FT ADLT (ELECTROSURGICAL) ×3
ELECTRODE REM PT RTRN 9FT ADLT (ELECTROSURGICAL) ×1 IMPLANT
GAUZE PETRO XEROFOAM 1X8 (MISCELLANEOUS) ×1 IMPLANT
GAUZE SPONGE 4X4 12PLY STRL (GAUZE/BANDAGES/DRESSINGS) ×3 IMPLANT
GLOVE BIO SURGEON STRL SZ8 (GLOVE) ×7 IMPLANT
GLOVE INDICATOR 8.0 STRL GRN (GLOVE) ×3 IMPLANT
GOWN STRL REUS W/ TWL LRG LVL3 (GOWN DISPOSABLE) ×1 IMPLANT
GOWN STRL REUS W/TWL LRG LVL3 (GOWN DISPOSABLE) ×2
KIT TURNOVER KIT A (KITS) ×3 IMPLANT
LABEL OR SOLS (LABEL) ×3 IMPLANT
NDL FILTER BLUNT 18X1 1/2 (NEEDLE) ×1 IMPLANT
NDL HYPO 25X1 1.5 SAFETY (NEEDLE) ×2 IMPLANT
NEEDLE FILTER BLUNT 18X 1/2SAF (NEEDLE) ×2
NEEDLE FILTER BLUNT 18X1 1/2 (NEEDLE) ×1 IMPLANT
NEEDLE HYPO 25X1 1.5 SAFETY (NEEDLE) ×6 IMPLANT
NS IRRIG 500ML POUR BTL (IV SOLUTION) ×3 IMPLANT
PACK EXTREMITY ARMC (MISCELLANEOUS) ×3 IMPLANT
STOCKINETTE STRL 6IN 960660 (GAUZE/BANDAGES/DRESSINGS) ×3 IMPLANT
SUT ETHILON 3-0 FS-10 30 BLK (SUTURE) ×3
SUT ETHILON 4-0 (SUTURE) ×2
SUT ETHILON 4-0 FS2 18XMFL BLK (SUTURE) ×1
SUT VIC AB 3-0 SH 27 (SUTURE) ×2
SUT VIC AB 3-0 SH 27X BRD (SUTURE) ×1 IMPLANT
SUT VIC AB 4-0 FS2 27 (SUTURE) ×3 IMPLANT
SUTURE EHLN 3-0 FS-10 30 BLK (SUTURE) ×1 IMPLANT
SUTURE ETHLN 4-0 FS2 18XMF BLK (SUTURE) ×1 IMPLANT
SYR 10ML LL (SYRINGE) ×3 IMPLANT
SYR 3ML LL SCALE MARK (SYRINGE) ×6 IMPLANT
WND VAC CANISTER 500ML (MISCELLANEOUS) ×2 IMPLANT

## 2017-04-18 NOTE — Op Note (Signed)
Operative note   Surgeon: Dr. Albertine Patricia, DPM.    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  Debridement of necrotic skin fat and muscle tissue from plantar right foot          EBL: 15 cc    Anesthesia:general delivered by the anesthesia team.  I block to the surgical layer proximally with 7 cc of 0.5% Marcaine plain    Hemostasis: None    Specimen: None    Complications: None    Operative indications: Chronic nonhealing wound to the plantar right foot.  Patient had a severe infection in the right foot approximately a month ago.  She had a first ray amputation at that timeframe.  Antibiotic beads were also placed in the wound.  The dorsal incision healed nicely but the plantar wound never completely closed and are healed up effectively.  Beads are still present area not degenerated much.  Patient underwent lower extremity angioplasty and catheterization yesterday.    Procedure:  Patient was brought into the OR and placed on the operating table in thesupine position. After anesthesia was obtained theright lower extremity was prepped and draped in usual sterile fashion.  Operative Report: This time to direct to the plantar aspect of the right foot where open wound was identified with antibiotic beads protruding in the wound.  All the beads were scooped out and a combination of curettage of the tissues as well as the use of versa jet to sharply debride and remove necrotic tissue from skin fatty tissue subcutaneous tissue capsular tissue muscle and tendon to the region was accomplished.  Bone was evaluated that time and seem to be fairly stable.  There was did not appear to be protruding or prominent to the region.  No real evidence of infection present at the time just necrosis of tissue.  Capillary bleeding was encountered but not a profuse amount.   As such felt like primary closure would probably not result in a adequate healing of those deeper soft tissues soft felt like a wound VAC was required at this time to try to promote the wound improvement and healing.  Wound itself measured 3 cm long 2.8 cm in width and approximately 3.5 cm in depth.  A wound VAC was applied following debridement this consisted of a silver sponge and the wound VAC was set at 125 mmHg pressure and continuous.  4 x 4's and Kerlix were placed across the wound to offer some padding and pressure relief for the area.  I do want the patient on bedrest at this point.  She will likely need a wound VAC for a couple of months.    Patient tolerated the procedure and anesthesia well.  Was transported from the OR to the PACU with all vital signs stable and vascular status intact. To be discharged per routine protocol.  Will follow up in approximately 1 week in the outpatient clinic.

## 2017-04-18 NOTE — Progress Notes (Signed)
[place] at [facility]                                                                                                                                                                                  Patient Demographics   Sharon [name], is a 84 y.o. female, DOB - 1933-12-03, CVE:[id number]  Admit date - 04/16/2017   Admitting Physician Fritzi Mandes, MD  Outpatient Primary MD for the patient is Rusty Aus, MD   LOS - 2  Subjective: Patient seen and evaluated today  status post iliac stent placement yesterday by vascular surgery s/p surgical debridement of the nonhealing leg ulcer today with wound VAC application No fever No complaints of any chest pain this morning Rested well last night   Review of Systems:   CONSTITUTIONAL: No documented fever. No fatigue, weakness. No weight gain, no weight loss.  EYES: No blurry or double vision.  ENT: No tinnitus. No postnasal drip. No redness of the oropharynx.  RESPIRATORY: No cough, no wheeze, no hemoptysis. No dyspnea.  CARDIOVASCULAR: No chest pain. No orthopnea. No palpitations. No syncope.  GASTROINTESTINAL: No nausea, no vomiting or diarrhea. No abdominal pain. No melena or hematochezia.  GENITOURINARY: No dysuria or hematuria.  ENDOCRINE: No polyuria or nocturia. No heat or cold intolerance.  HEMATOLOGY: No anemia. No bruising. No bleeding.  INTEGUMENTARY: No rashes. No lesions.  MUSCULOSKELETAL: right foot pain NEUROLOGIC: No numbness, tingling, or ataxia. No seizure-type activity.  PSYCHIATRIC: No anxiety. No insomnia. No ADD.    Vitals:   Vitals:   04/18/17 1156 04/18/17 1230 04/18/17 1330 04/18/17 1430  BP: (!) 157/49 (!) 154/54 (!) 152/56 (!) 156/62  Pulse: (!) 57 (!) 58 69 80  Resp:      Temp: 97.8 F (36.6 C)  (!) 97.5 F (36.4 C) 98.4 F (36.9 C)  TempSrc: Oral  Oral Oral  SpO2: 100% 97% 98% 94%  Weight:      Height:        Wt Readings from Last 3 Encounters:  04/17/17 64 kg  (141 lb)  04/15/17 64 kg (141 lb 1.6 oz)  03/20/17 64.8 kg (142 lb 13.7 oz)     Intake/Output Summary (Last 24 hours) at 04/18/2017 1509 Last data filed at 04/18/2017 1329 Gross per 24 hour  Intake 690 ml  Output 677 ml  Net 13 ml    Physical Exam:   GENERAL: Elderly female patient lying on the bed in no acute distress HEAD, EYES, EARS, NOSE AND THROAT: Atraumatic, normocephalic. Extraocular muscles are intact. Pupils equal and reactive to light. Sclerae anicteric. No conjunctival injection. No oro-pharyngeal erythema.  NECK: Supple. There is no jugular  venous distention. No bruits, no lymphadenopathy, no thyromegaly.  HEART: Regular rate and rhythm,. No murmurs, no rubs, no clicks.  LUNGS: Clear to auscultation bilaterally. No rales or rhonchi. No wheezes.  ABDOMEN: Soft, flat, nontender, nondistended. Has good bowel sounds. No hepatosplenomegaly appreciated.  EXTREMITIES: No evidence of any cyanosis, clubbing, or peripheral edema.  +2 pedal pulses left foot. Right foot bandage with wound vac NEUROLOGIC: The patient is alert, awake, and oriented x3 with no focal motor or sensory deficits appreciated bilaterally.  SKIN: Moist and warm with no rashes appreciated.  Psych: Not anxious, depressed LN: No inguinal LN enlargement    Antibiotics   Anti-infectives (From admission, onward)   Start     Dose/Rate Route Frequency Ordered Stop   04/17/17 0700  ceFAZolin (ANCEF) IVPB 1 g/50 mL premix    Note to Pharmacy:  Send with pt to OR   1 g 100 mL/hr over 30 Minutes Intravenous On call 04/16/17 1905 04/17/17 1235   04/16/17 1700  piperacillin-tazobactam (ZOSYN) IVPB 3.375 g     3.375 g 12.5 mL/hr over 240 Minutes Intravenous Every 8 hours 04/16/17 1650        Medications   Scheduled Meds: . amiodarone  200 mg Oral Daily  . amitriptyline  10 mg Oral QHS  . aspirin EC  81 mg Oral Daily  . bisacodyl  10 mg Oral QODAY  . cholecalciferol  1,000 Units Oral Daily  . ferrous sulfate   325 mg Oral BID  . furosemide  20 mg Oral Daily  . gabapentin  100 mg Oral QID  . gabapentin  600 mg Oral QID  . glimepiride  2 mg Oral Q breakfast  . GLUCERNA  237 mL Oral BID BM  . insulin aspart  0-9 Units Subcutaneous TID WC  . levothyroxine  112 mcg Oral QAC breakfast  . losartan  25 mg Oral BID  . metoprolol tartrate  25 mg Oral BID  . multivitamin-lutein  1 capsule Oral BID  . mupirocin ointment  1 application Nasal BID  . senna-docusate  1 tablet Oral BID  . simvastatin  20 mg Oral Daily  . sodium chloride flush  10-40 mL Intracatheter Q12H  . sodium chloride flush  3 mL Intravenous Q12H  . vitamin C  500 mg Oral BID   Continuous Infusions: . sodium chloride    . piperacillin-tazobactam (ZOSYN)  IV 3.375 g (04/18/17 1432)   PRN Meds:.sodium chloride, acetaminophen **OR** acetaminophen, acetaminophen, hydrALAZINE, hydrALAZINE, labetalol, morphine injection, ondansetron **OR** ondansetron (ZOFRAN) IV, ondansetron (ZOFRAN) IV, oxyCODONE, oxyCODONE, senna-docusate, sodium chloride flush, sodium chloride flush, sodium chloride flush, sodium chloride flush, traMADol, zolpidem   Data Review:   Micro Results Recent Results (from the past 240 hour(s))  Surgical PCR screen     Status: None   Collection Time: 04/16/17  6:37 PM  Result Value Ref Range Status   MRSA, PCR NEGATIVE NEGATIVE Final   Staphylococcus aureus NEGATIVE NEGATIVE Final    Comment: (NOTE) The Xpert SA Assay (FDA approved for NASAL specimens in patients 51 years of age and older), is one component of a comprehensive surveillance program. It is not intended to diagnose infection nor to guide or monitor treatment. Performed at [facility], [address], [place], [place] [address]     Radiology Reports Ct Abdomen Pelvis Wo Contrast  Result Date: 04/17/2017 CLINICAL DATA:  Abdominal distension and decreased blood pressure following lower extremity angiography and bilateral common iliac artery  stenting today. EXAM: CT  ABDOMEN AND PELVIS WITHOUT CONTRAST TECHNIQUE: Multidetector CT imaging of the abdomen and pelvis was performed following the standard protocol without IV contrast. COMPARISON:  01/21/2017 FINDINGS: Lower chest: Mild motion artifact. Subsegmental atelectasis in the lung bases. No pleural effusion. Coronary artery atherosclerosis. Hepatobiliary: No focal liver abnormality is identified. There is no significant biliary dilatation status post cholecystectomy. Pancreas: 3.3 x 2.8 cm low-density/cystic mass in the pancreatic body is minimally larger than on the prior study. Smaller cystic lesions more distally in the pancreatic body and tail are similar to the prior study measuring up to 2.2 cm in size. No acute peripancreatic inflammatory changes. Spleen: Unremarkable. Adrenals/Urinary Tract: Unremarkable adrenal glands. Excreted contrast in the renal collecting systems and bladder. No hydronephrosis. Unchanged left lower pole renal scarring. Unchanged 1.3 cm left upper pole renal cyst and 8 mm left upper pole angiomyolipoma. Subcentimeter hypodense lesions in both kidneys are too small to fully characterize. Stomach/Bowel: The stomach is within normal limits. There is no evidence of bowel obstruction. A moderately large amount of stool is present in the rectum. Prior appendectomy. Vascular/Lymphatic: Abdominal aortic atherosclerosis without aneurysm. Bilateral common iliac artery stents extending into the distal abdominal aorta. No retroperitoneal hemorrhage about the abdominal aorta or iliac artery stents. Mildly prominent gastrohepatic and periportal lymph nodes measure up to 10 mm in short axis and are overall stable to minimally larger. Reproductive: Limited assessment due to artifact from bilateral hip arthroplasties. There is likely an atrophic uterus. Ovaries are grossly unremarkable. Other: No intraperitoneal free fluid. Small to moderate volume hematoma in the right inguinal region  extending to the inferior aspect of the iliac fossa, partially obscured by streak artifact. Subcutaneous stranding extends inferiorly and medially into the groin. Musculoskeletal: Old lower anterior rib fractures on the right. Bilateral hip arthroplasties. Old fractures of the inferior left pubic ramus and left ilium extending to the SI joint. Diffuse thoracolumbar disc degeneration. IMPRESSION: 1. Small to moderate volume hematoma in the right inguinal region mildly extending into the iliac fossa. 2. Stable to minimally increased size of multilocular cystic pancreatic body lesion with similar appearance of smaller cystic lesions in the pancreatic tail. Abdominal MRI is recommended on a nonemergent basis when the patient is able to follow breath hold instructions. 3. Stable to minimally increased size of upper abdominal lymph nodes. 4.  Aortic Atherosclerosis (ICD10-I70.0). Electronically Signed   By: Logan Bores M.D.   On: 04/17/2017 16:27   Dg Chest 1 View  Result Date: 03/19/2017 CLINICAL DATA:  Short of breath EXAM: CHEST 1 VIEW COMPARISON:  10/15/2014 FINDINGS: Mild biapical scarring. No focal pulmonary opacity or pleural effusion. Stable cardiomediastinal silhouette with aortic atherosclerosis. No pneumothorax. Old right second rib fracture. IMPRESSION: No active disease. Electronically Signed   By: Donavan Foil M.D.   On: 03/19/2017 19:59   Dg Ankle Complete Right  Result Date: 03/19/2017 CLINICAL DATA:  Erythema. Evaluate for possible osteomyelitis. Personal history of left foot osteomyelitis in 2016. EXAM: RIGHT ANKLE - COMPLETE 3+ VIEW COMPARISON:  None. FINDINGS: Mild diffuse soft tissue swelling. Osseous demineralization. No evidence of acute fracture or dislocation. Ankle mortise intact with mild joint space narrowing. No evidence of osteomyelitis. IMPRESSION: 1. No acute osseous abnormality.  No evidence of osteomyelitis. 2. Osseous demineralization. 3. Mild osteoarthritis. Electronically  Signed   By: Evangeline Dakin M.D.   On: 03/19/2017 21:21   Ct Head Wo Contrast  Result Date: 03/19/2017 CLINICAL DATA:  84 year old with progressively worsening confusion over the past 2 days. Patient  fell yesterday. Initial encounter. EXAM: CT HEAD WITHOUT CONTRAST TECHNIQUE: Contiguous axial images were obtained from the base of the skull through the vertex without intravenous contrast. COMPARISON:  12/02/2014, 11/05/2014. FINDINGS: Brain: Ventricular system normal in size and appearance for age. Mild age related cortical atrophy, unchanged. Moderate changes of small vessel disease of the white matter diffusely, unchanged. No mass lesion. No midline shift. No acute hemorrhage or hematoma. No extra-axial fluid collections. No evidence of acute infarction. Vascular: Mild bilateral carotid siphon and vertebral artery atherosclerosis. No hyperdense vessel. Skull: Marked hyperostosis frontalis interna bilaterally. No skull fracture or other focal osseous abnormality involving the skull. Sinuses/Orbits: Visualized paranasal sinuses, bilateral mastoid air cells and bilateral middle ear cavities well-aerated. Visualized orbits and globes intact. Hyperdense lens involving the right globe, query cataract. Other: None. IMPRESSION: 1. No acute intracranial abnormality. 2. Stable mild age related cortical atrophy and stable moderate chronic microvascular ischemic changes of the white matter. Electronically Signed   By: Evangeline Dakin M.D.   On: 03/19/2017 20:11   Mr Foot Right Wo Contrast  Result Date: 03/20/2017 CLINICAL DATA:  Diabetic with foot ulcerations noted increasing erythema and swelling with drainage. EXAM: MRI OF THE RIGHT FOREFOOT WITHOUT CONTRAST TECHNIQUE: Multiplanar, multisequence MR imaging of the right forefoot was performed. No intravenous contrast was administered. COMPARISON:  None. FINDINGS: Bones/Joint/Cartilage Marrow edema of the great toe is identified involving the distal phalanx and head  of the first proximal phalanx, series 7, image 24 suspicious for changes of acute osteomyelitis. Additionally there is intraosseous elevated T2 signal within the distal first metatarsal associated with a cortical defect along the plantar aspect suspicious for a cloaca from chronic osteomyelitis. Callus is noted about a healing distal third metatarsal fracture involving the neck. Small focus of T2 hyperintensities seen that could represent a small posttraumatic cyst given its wall circumscribed margins. Ligaments No focal abnormality Muscles and Tendons No intramuscular mass or fluid collection. Focal abnormality of the extensor and flexor tendons crossing the forefoot. Soft tissues Plantar soft tissue ulceration of the forefoot adjacent to the first MTP articulation. Diffuse soft tissue edema of the included forefoot consistent with cellulitis and/or third spacing of fluid. IMPRESSION: 1. Diffuse soft tissue swelling of the included forefoot without focal drainable fluid collection or abscess identified. 2. Marrow abnormality of the distal phalanx and head of first proximal phalanx of the great toe associated with soft tissue ulceration along the plantar aspect the forefoot at the level of the first metatarsal head. Findings are concerning for changes of acute osteomyelitis. 3. Possible cloaca involving the distal first metatarsal with intraosseous elevated T2 signal suspicious for changes of chronic osteomyelitis. 4. Healing third metatarsal neck fracture with small focus of T2 hyperintensity more likely related to posttraumatic degenerative cyst though the possibility of chronic osteomyelitis is not completely excluded as well. Electronically Signed   By: Ashley Royalty M.D.   On: 03/20/2017 22:53   Dg Chest Port 1 View  Result Date: 03/23/2017 CLINICAL DATA:  84 year old female admitted with sepsis and right foot osteomyelitis, postoperative day 2 status post amputation of the right foot 1st ray and midfoot  debridement. EXAM: PORTABLE CHEST 1 VIEW COMPARISON:  03/19/2017 and earlier. FINDINGS: Portable AP upright view at 0700 hours. Stable lung volumes. Stable cardiac size and mediastinal contours. Visualized tracheal air column is within normal limits. No pneumothorax. No pulmonary edema or pleural effusion. Mildly increased left greater than right upper lung interstitial opacity. No other confluent opacity. Calcified aortic atherosclerosis. IMPRESSION:  1. Mild asymmetric and increased upper lung interstitial opacity since 03/19/2017. Consider developing bilateral respiratory infection. 2. No pleural effusion or other acute cardiopulmonary abnormality. Electronically Signed   By: Genevie Ann M.D.   On: 03/23/2017 07:56   Dg Foot Complete Left  Result Date: 03/19/2017 CLINICAL DATA:  Erythema. Evaluate for possible osteomyelitis. Personal history of left foot osteomyelitis in 2016. EXAM: LEFT FOOT - COMPLETE 3+ VIEW COMPARISON:  Left foot MRI 10/10/2014 left foot x-rays 02/23/2012. FINDINGS: No evidence of acute fracture or dislocation. Osseous demineralization. Hammertoe deformity. Likely remote healed fracture involving the proximal phalanx of the second toe. No evidence of osteomyelitis. Narrowing of the IP joint spaces of the toes and narrowing of joint spaces in the midfoot, unchanged. Small plantar calcaneal spur. Small enthesopathic spur at the insertion of the Achilles tendon on the posterior calcaneus. IMPRESSION: 1. No acute osseous abnormality.  No evidence of osteomyelitis. 2. Osseous demineralization. 3. Mild osteoarthritis. Electronically Signed   By: Evangeline Dakin M.D.   On: 03/19/2017 21:20   Dg Foot Complete Right  Result Date: 03/19/2017 CLINICAL DATA:  Erythema. Evaluate for possible osteomyelitis. Personal history of left foot osteomyelitis in 2016. EXAM: RIGHT FOOT COMPLETE - 3+ VIEW COMPARISON:  None. FINDINGS: Soft tissue ulceration with gas in the tissues of the plantar surface underlying  the head of the first metatarsal. No evidence of osteomyelitis. No evidence of acute fracture or dislocation. Osseous demineralization. Remote fracture involving the head of the third metatarsal with incomplete union. Hammertoe deformity. Joint space narrowing involving the IP joints of the toes and joints of the midfoot. IMPRESSION: 1. Soft tissue ulceration with gas in the tissues of the plantar surface of the foot underlying the head of the first metatarsal. No evidence of osteomyelitis. 2. Remote fracture involving the head of the third metatarsal with incomplete union. 3. Mild osteoarthritis. 4. Osseous demineralization. Electronically Signed   By: Evangeline Dakin M.D.   On: 03/19/2017 21:24   [place] Ekg Site Rite  Result Date: 03/23/2017 If Site Rite image not attached, placement could not be confirmed due to current cardiac rhythm.    CBC Recent Labs  Lab 04/16/17 1727  WBC 6.5  HGB 11.2*  HCT 35.2  PLT 231  MCV 81.5  MCH 26.0  MCHC 31.9*  RDW 23.8*    Chemistries  Recent Labs  Lab 04/16/17 1727  NA 137  K 4.3  CL 105  CO2 26  GLUCOSE 140*  BUN 31*  CREATININE 1.24*  CALCIUM 9.0   ------------------------------------------------------------------------------------------------------------------ estimated creatinine clearance is 32 mL/min (A) (by C-G formula based on SCr of 1.24 mg/dL (H)). ------------------------------------------------------------------------------------------------------------------ No results for input(s): HGBA1C in the last 72 hours. ------------------------------------------------------------------------------------------------------------------ No results for input(s): CHOL, HDL, LDLCALC, TRIG, CHOLHDL, LDLDIRECT in the last 72 hours. ------------------------------------------------------------------------------------------------------------------ No results for input(s): TSH, T4TOTAL, T3FREE, THYROIDAB in the last 72 hours.  Invalid input(s):  FREET3 ------------------------------------------------------------------------------------------------------------------ No results for input(s): VITAMINB12, FOLATE, FERRITIN, TIBC, IRON, RETICCTPCT in the last 72 hours.  Coagulation profile No results for input(s): INR, PROTIME in the last 168 hours.  No results for input(s): DDIMER in the last 72 hours.  Cardiac Enzymes No results for input(s): CKMB, TROPONINI, MYOGLOBIN in the last 168 hours.  Invalid input(s): CK ------------------------------------------------------------------------------------------------------------------ Invalid input(s): POCBNP    Assessment & Plan   84 year old elderly female patient with history of atrial fibrillation, diabetes mellitus, peripheral vascular disease was admitted directly from podiatry office for nonhealing right foot ulcer.  Patient had a wound  dehiscence.  1.  Nonhealing right foot ulcer Status post debridement and irrigation Continue wound VAC Wound care consultation daily dressings Status post angiogram and stent placements by vascular surgery Continue IV Zosyn antibiotic  2.  Diabetes mellitus type 2 Continue sliding scale coverage with insulin Diabetic diet  3.  Chronic stable diastolic heart failure Compensated  4.  Proximal atrial fibrillation On oral amiodarone and aspirin     Code Status Orders  (From admission, onward)        Start     Ordered   04/16/17 1706  Full code  Continuous     04/16/17 1705    Code Status History    Date Active Date Inactive Code Status Order ID Comments User Context   03/23/2017 1422 03/23/2017 2233 Full Code [id number]  Gladstone Lighter, MD Inpatient   03/20/2017 1121 03/23/2017 1422 DNR [id number]  Bettey Costa, MD Inpatient   03/20/2017 0140 03/20/2017 1121 Full Code [id number]  Lance Coon, MD ED   07/07/2015 0353 07/09/2015 1520 Full Code [id number]  Saundra Shelling, MD Inpatient   02/07/2015 1605 02/12/2015 1921 Full Code [id number]   Dustin Flock, MD Inpatient   11/05/2014 1551 11/09/2014 1536 Full Code [id number]  Gladstone Lighter, MD Inpatient   10/10/2014 1112 10/17/2014 1750 Full Code [id number]  Epifanio Lesches, MD Inpatient    Advance Directive Documentation     Most Recent Value  Type of Advance Directive  Healthcare Power of Attorney  Pre-existing out of facility DNR order (yellow form or pink MOST form)  -  "MOST" Form in Place?  -      Time Spent in minutes   35  Greater than 50% of time spent in care coordination and counseling patient regarding the condition and plan of care.   Saundra Shelling M.D on 04/18/2017 at 3:09 PM  Between 7am to 6pm - Pager - [phone]  After 6pm go to [email or web address] - Proofreader  [employer]   Office  [phone]

## 2017-04-18 NOTE — Progress Notes (Signed)
PT Cancellation Note  Patient Details Name: Sharon [name] MRN: [id number] DOB: 04/30/1933   Cancelled Treatment:    Reason Eval/Treat Not Completed: Patient not medically ready.  PT consult received.  Chart reviewed.  Pt s/p iliac stent placement yesterday and today s/p surgical debridement of nonhealing diabetic ulceration/surgical wound from previous first metatarsal great toe amputation with wound VAC application.  Surgery performed today under general anesthesia; per PT protocol require new PT consult (d/t general anesthesia).  Pt also noted to be on bedrest.  Per discussion with nursing, will cancel PT order and nurse reports she will discuss appropriateness of new PT consult with MD tomorrow (please re-consult PT when pt is medically appropriate to participate in PT).  Leitha Bleak, PT 04/18/17, 4:02 PM [phone]

## 2017-04-18 NOTE — H&P (Signed)
H and P has been reviewed and no changes are noted.  

## 2017-04-18 NOTE — Anesthesia Procedure Notes (Signed)
Procedure Name: LMA Insertion Date/Time: 04/18/2017 9:21 AM Performed by: Hedda Slade, CRNA Pre-anesthesia Checklist: Patient identified, Patient being monitored, Timeout performed, Emergency Drugs available and Suction available Patient Re-evaluated:Patient Re-evaluated prior to induction Oxygen Delivery Method: Circle system utilized Preoxygenation: Pre-oxygenation with 100% oxygen Induction Type: IV induction Ventilation: Mask ventilation without difficulty LMA: LMA inserted LMA Size: 3.5 Tube type: Oral Number of attempts: 1 Placement Confirmation: positive ETCO2 and breath sounds checked- equal and bilateral Tube secured with: Tape Dental Injury: Teeth and Oropharynx as per pre-operative assessment

## 2017-04-18 NOTE — Anesthesia Post-op Follow-up Note (Signed)
Anesthesia QCDR form completed.        

## 2017-04-18 NOTE — Anesthesia Preprocedure Evaluation (Signed)
Anesthesia Evaluation  Patient identified by MRN, date of birth, ID band Patient awake    Reviewed: Allergy & Precautions, NPO status , Patient's Chart, lab work & pertinent test results  History of Anesthesia Complications Negative for: history of anesthetic complications  Airway Mallampati: III  TM Distance: >3 FB Neck ROM: Full    Dental  (+) Implants   Pulmonary neg sleep apnea, neg COPD, former smoker,    breath sounds clear to auscultation- rhonchi (-) wheezing      Cardiovascular hypertension, Pt. on medications + Peripheral Vascular Disease and +CHF  (-) CAD, (-) Past MI, (-) Cardiac Stents and (-) CABG + dysrhythmias Atrial Fibrillation  Rhythm:Regular Rate:Normal - Systolic murmurs and - Diastolic murmurs NM stress test 02/07/15: no ischemia  Echo 02/07/15: NORMAL LEFT VENTRICULAR SYSTOLIC FUNCTION NORMAL RIGHT VENTRICULAR SYSTOLIC FUNCTION MILD VALVULAR REGURGITATION (mild MR) NO VALVULAR STENOSIS Ejection fraction greater than 55%   Neuro/Psych CVA, No Residual Symptoms negative psych ROS   GI/Hepatic negative GI ROS, Neg liver ROS,   Endo/Other  diabetes, Oral Hypoglycemic AgentsHypothyroidism   Renal/GU Renal InsufficiencyRenal disease     Musculoskeletal negative musculoskeletal ROS (+)   Abdominal (+) - obese,   Peds  Hematology  (+) anemia ,   Anesthesia Other Findings Past Medical History: No date: Anemia No date: Atrial fibrillation (HCC) No date: Cardiac arrest (HCC) No date: Cataract No date: CHF (congestive heart failure) (HCC) No date: Diabetes mellitus without complication (HCC) No date: Edema     Comment:  feet/ankles occas No date: Hip fracture (HCC) No date: Hyperlipidemia No date: Hypertension No date: Hypothyroid No date: Neuropathy No date: Osteomyelitis ([place])     Comment:  left first metatarsal No date: Stroke [facility])   Reproductive/Obstetrics                              Anesthesia Physical Anesthesia Plan  ASA: III  Anesthesia Plan: General   Post-op Pain Management:    Induction: Intravenous  PONV Risk Score and Plan: 2 and Ondansetron and Dexamethasone  Airway Management Planned: LMA  Additional Equipment:   Intra-op Plan:   Post-operative Plan:   Informed Consent: I have reviewed the patients History and Physical, chart, labs and discussed the procedure including the risks, benefits and alternatives for the proposed anesthesia with the patient or authorized representative who has indicated his/her understanding and acceptance.   Dental advisory given  Plan Discussed with: CRNA and Anesthesiologist  Anesthesia Plan Comments:         Anesthesia Quick Evaluation

## 2017-04-18 NOTE — Anesthesia Postprocedure Evaluation (Signed)
Anesthesia Post Note  Patient: [place]  Procedure(s) Performed: IRRIGATION AND DEBRIDEMENT FOOT and application wound vac (Right )  Patient location during evaluation: PACU Anesthesia Type: General Level of consciousness: awake and alert and oriented Pain management: pain level controlled Vital Signs Assessment: post-procedure vital signs reviewed and stable Respiratory status: spontaneous breathing, nonlabored ventilation and respiratory function stable Cardiovascular status: blood pressure returned to baseline and stable Postop Assessment: no signs of nausea or vomiting Anesthetic complications: no     Last Vitals:  Vitals:   04/18/17 1156 04/18/17 1230  BP: (!) 157/49 (!) 154/54  Pulse: (!) 57 (!) 58  Resp:    Temp: 36.6 C   SpO2: 100% 97%    Last Pain:  Vitals:   04/18/17 1156  TempSrc: Oral  PainSc:                  Estefanie Cornforth

## 2017-04-18 NOTE — Transfer of Care (Signed)
Immediate Anesthesia Transfer of Care Note  Patient: Sharon [name]  Procedure(s) Performed: IRRIGATION AND DEBRIDEMENT FOOT and application wound vac (Right )  Patient Location: PACU  Anesthesia Type:General  Level of Consciousness: sedated  Airway & Oxygen Therapy: Patient Spontanous Breathing and Patient connected to nasal cannula oxygen  Post-op Assessment: Report given to RN and Post -op Vital signs reviewed and stable  Post vital signs: Reviewed and stable  Last Vitals:  Vitals Value Taken Time  BP 144/50 04/18/2017 10:01 AM  Temp    Pulse 70 04/18/2017 10:01 AM  Resp 14 04/18/2017 10:01 AM  SpO2 99 % 04/18/2017 10:01 AM  Vitals shown include unvalidated device data.  Last Pain:  Vitals:   04/18/17 0729  TempSrc: Oral  PainSc:          Complications: No apparent anesthesia complications

## 2017-04-19 ENCOUNTER — Encounter: Payer: Self-pay | Admitting: Podiatry

## 2017-04-19 DIAGNOSIS — N390 Urinary tract infection, site not specified: Secondary | ICD-10-CM

## 2017-04-19 DIAGNOSIS — N9089 Other specified noninflammatory disorders of vulva and perineum: Secondary | ICD-10-CM

## 2017-04-19 DIAGNOSIS — I4891 Unspecified atrial fibrillation: Secondary | ICD-10-CM

## 2017-04-19 LAB — BASIC METABOLIC PANEL
Anion gap: 7 (ref 5–15)
BUN: 21 mg/dL — ABNORMAL HIGH (ref 6–20)
CO2: 23 mmol/L (ref 22–32)
CREATININE: 1.42 mg/dL — AB (ref 0.44–1.00)
Calcium: 8.7 mg/dL — ABNORMAL LOW (ref 8.9–10.3)
Chloride: 106 mmol/L (ref 101–111)
GFR calc non Af Amer: 34 mL/min — ABNORMAL LOW (ref >60)
GFR, EST AFRICAN AMERICAN: 39 mL/min — AB (ref >60)
Glucose, Bld: 191 mg/dL — ABNORMAL HIGH (ref 65–99)
Potassium: 4.1 mmol/L (ref 3.5–5.1)
Sodium: 136 mmol/L (ref 135–145)

## 2017-04-19 LAB — GLUCOSE, CAPILLARY
Glucose-Capillary: 143 mg/dL — ABNORMAL HIGH (ref 65–99)
Glucose-Capillary: 118 mg/dL — ABNORMAL HIGH (ref 65–99)
Glucose-Capillary: 164 mg/dL — ABNORMAL HIGH (ref 65–99)
Glucose-Capillary: 157 mg/dL — ABNORMAL HIGH (ref 65–99)

## 2017-04-19 LAB — CBC
HEMATOCRIT: 29 % — AB (ref 35.0–47.0)
HEMOGLOBIN: 9.3 g/dL — AB (ref 12.0–16.0)
MCH: 26.1 pg (ref 26.0–34.0)
MCHC: 32 g/dL (ref 32.0–36.0)
MCV: 81.5 fL (ref 80.0–100.0)
Platelets: 226 10*3/uL (ref 150–440)
RBC: 3.57 MIL/uL — ABNORMAL LOW (ref 3.80–5.20)
RDW: 23.9 % — ABNORMAL HIGH (ref 11.5–14.5)
WBC: 7.5 10*3/uL (ref 3.6–11.0)

## 2017-04-19 NOTE — Progress Notes (Signed)
Sharon [name]                                                      Patient Demographics  Sharon [name], is a 84 y.o. female   MRN: [id number]   DOB - 21-Jul-1933  Admit Date - 04/16/2017    Outpatient Primary MD for the patient is Rusty Aus, MD With History of -  Past Medical History:  Diagnosis Date  . Anemia   . Atrial fibrillation ([place])   . Cardiac arrest ([place])   . Cataract   . CHF (congestive heart failure) ([place])   . Diabetes mellitus without complication ([place])   . Edema    feet/ankles occas  . Hip fracture ([place])   . Hyperlipidemia   . Hypertension   . Hypothyroid   . Neuropathy   . Osteomyelitis ([place])    left first metatarsal  . Stroke [facility])       Past Surgical History:  Procedure Laterality Date  . ACHILLES TENDON SURGERY Left 02/08/2015   Procedure: ACHILLES LENGTHENING/KIDNER;  Surgeon: Albertine Patricia, DPM;  Location: [facility] ORS;  Service: [name];  Laterality: Left;  . APPENDECTOMY    . CARDIAC CATHETERIZATION  08/25/13  . CATARACT EXTRACTION W/PHACO Left 07/20/2014   Procedure: CATARACT EXTRACTION PHACO AND INTRAOCULAR LENS PLACEMENT (IOC);  Surgeon: Leandrew Koyanagi, MD;  Location: [facility] ORS;  Service: Ophthalmology;  Laterality: Left;  [place]  1:18                 AP     23.6             CDE   9.69      lot #[phone]  . CHOLECYSTECTOMY    . CORONARY ANGIOPLASTY    . HALLUX VALGUS AKIN Left 02/08/2015   Procedure: HALLUX VALGUS AKIN/ KELLER;  Surgeon: Albertine Patricia, DPM;  Location: [facility] ORS;  Service: [name];  Laterality: Left;  IVA with Local needs 1 hour for this case   . HEMIARTHROPLASTY HIP Right   . HEMIARTHROPLASTY HIP Left   . INCISION AND DRAINAGE Left 10/11/2014   Procedure: Removal of infected tibial sessmoid;  Surgeon: Albertine Patricia, DPM;  Location: [facility] ORS;  Service: [name];  Laterality: Left;  . IRRIGATION  AND DEBRIDEMENT FOOT Right 03/21/2017   Procedure: IRRIGATION AND DEBRIDEMENT FOOT right great toe amputation;  Surgeon: Sharlotte Alamo, DPM;  Location: [facility] ORS;  Service: [name];  Laterality: Right;  . IRRIGATION AND DEBRIDEMENT FOOT Right 04/18/2017   Procedure: IRRIGATION AND DEBRIDEMENT FOOT and application wound vac;  Surgeon: Albertine Patricia, DPM;  Location: [facility] ORS;  Service: [name];  Laterality: Right;    in for   No chief complaint on file.    HPI  Sharon [name]  is a 84 y.o. female, 1 day postop following debridement of ulceration plantar right foot.  At the time it felt like her circulation was still somewhat compromised to the plantar aspect so I elected to put a wound VAC in the wound once I was able to clean up all necrotic tissue.    Physical Exam  Vitals  Blood pressure (!) 169/54, pulse (!) 58, temperature 97.9 F (36.6 C), temperature source Oral, resp. rate 18, height 5\' 5"  (1.651 m), weight 64 kg (141 lb), SpO2 97 %.  Lower Extremity exam: Wound VAC is intact  and stable at this juncture.  Appears to be functioning well she is comfortable with it.  She complains about pain in the left foot but I think this is likely neuropathy and also perhaps the sheet and blanket were a little tight across her toes.  Otherwise she stable vital signs are stable except for some elevated blood pressure which hospitalist will address.  Her blood pressure was significantly higher in the office the other day prior to admission.  Data Review  CBC Recent Labs  Lab 04/16/17 1727 04/19/17 0339  WBC 6.5 7.5  HGB 11.2* 9.3*  HCT 35.2 29.0*  PLT 231 226  MCV 81.5 81.5  MCH 26.0 26.1  MCHC 31.9* 32.0  RDW 23.8* 23.9*   ------------------------------------------------------------------------------------------------------------------  Chemistries  Recent Labs  Lab 04/16/17 1727 04/19/17 0339  NA 137 136  K 4.3 4.1  CL 105 106  CO2 26 23  GLUCOSE 140* 191*  BUN 31* 21*   CREATININE 1.24* 1.42*  CALCIUM 9.0 8.7*   -------- Assessment & Plan: Overall stable and continue with the wound VAC I will leave it intact for today.  The likely need to be changed tomorrow.  Patient needs to really stay off of this.  I did write a order for PT to get started with her ability to transfer over to a bedside chair to bedside toilet but that is about it as far as her ambulation is concerned.  Ordered also in [employer] shoe for to wear on the right foot in a postop shoe to wear on the left foot at least for the current timeframe.  She will need to go to rehab and wound VAC likely to be continued for approximately a month and 1/2-2 months.  Active Problems:   Non-healing ulcer ([place])   Family Communication: Plan discussed with patient   Albertine Patricia M.D on 04/19/2017 at 9:05 AM  Thank you for the consult, we will follow the patient with you in the Hospital.

## 2017-04-19 NOTE — Progress Notes (Signed)
Puffiness and bruises noted on patients perineal and groin area. Right labia bigger than the left. Patient complained of having some pain around the area. MD made aware. New order obtained.

## 2017-04-19 NOTE — Progress Notes (Signed)
[place] at [facility]                                                                                                                                                                                  Patient Demographics   Sharon [name], is a 84 y.o. female, DOB - 11/18/1933, KGM:[id number]  Admit date - 04/16/2017   Admitting Physician Fritzi Mandes, MD  Outpatient Primary MD for the patient is Rusty Aus, MD   LOS - 3  Subjective: Patient seen and evaluated today  Today nurse noted swelling and bruising along the groin and into the mons pubis and labia Difficulty voiding Has pain in the left great toe Status post debridement of right foot ulcer Currently on wound VAC  Review of Systems:   CONSTITUTIONAL: No documented fever. No fatigue, weakness. No weight gain, no weight loss.  EYES: No blurry or double vision.  ENT: No tinnitus. No postnasal drip. No redness of the oropharynx.  RESPIRATORY: No cough, no wheeze, no hemoptysis. No dyspnea.  CARDIOVASCULAR: No chest pain. No orthopnea. No palpitations. No syncope.  GASTROINTESTINAL: No nausea, no vomiting or diarrhea. No abdominal pain. No melena or hematochezia.  GENITOURINARY: No dysuria or hematuria.  ENDOCRINE: No polyuria or nocturia. No heat or cold intolerance.  HEMATOLOGY: Has bruising along the groin extending into the mons pubis and labia INTEGUMENTARY: No rashes. No lesions.  MUSCULOSKELETAL: right foot pain Has left great toe pain NEUROLOGIC: No numbness, tingling, or ataxia. No seizure-type activity.  PSYCHIATRIC: No anxiety. No insomnia. No ADD.    Vitals:   Vitals:   04/18/17 2031 04/18/17 2316 04/19/17 0339 04/19/17 0759  BP: (!) 170/55 (!) 161/46 (!) 154/52 (!) 169/54  Pulse: 67 64 64 (!) 58  Resp: 18 18 18    Temp: 98.5 F (36.9 C) 98.1 F (36.7 C) 97.6 F (36.4 C) 97.9 F (36.6 C)  TempSrc: Oral Oral Oral Oral  SpO2: 98% 91% 97% 97%  Weight:      Height:        Wt  Readings from Last 3 Encounters:  04/17/17 64 kg (141 lb)  04/15/17 64 kg (141 lb 1.6 oz)  03/20/17 64.8 kg (142 lb 13.7 oz)     Intake/Output Summary (Last 24 hours) at 04/19/2017 1335 Last data filed at 04/18/2017 1600 Gross per 24 hour  Intake 50 ml  Output -  Net 50 ml    Physical Exam:   GENERAL: Elderly female patient lying on the bed in no acute distress HEAD, EYES, EARS, NOSE AND THROAT: Atraumatic, normocephalic. Extraocular muscles are intact. Pupils equal and reactive to light. Sclerae anicteric. No conjunctival injection. No  oro-pharyngeal erythema.  NECK: Supple. There is no jugular venous distention. No bruits, no lymphadenopathy, no thyromegaly.  HEART: Regular rate and rhythm,. No murmurs, no rubs, no clicks.  LUNGS: Clear to auscultation bilaterally. No rales or rhonchi. No wheezes.  ABDOMEN: Soft, flat, nontender, nondistended. Has good bowel sounds. No hepatosplenomegaly appreciated. Bruising noted in the bilateral groin areas Genitourinary has asymmetric edema in the mons pubis and vulvar area with some bruising noted EXTREMITIES: No evidence of any cyanosis, clubbing, or peripheral edema.  +2 pedal pulses left foot. Right foot bandage with wound vac No evidence of any trauma to the left great toe No swelling of the left great toe NEUROLOGIC: The patient is alert, awake, and oriented x3 with no focal motor or sensory deficits appreciated bilaterally.  SKIN: Moist and warm with no rashes appreciated.  Psych: Not anxious, depressed LN: No inguinal LN enlargement    Antibiotics   Anti-infectives (From admission, onward)   Start     Dose/Rate Route Frequency Ordered Stop   04/17/17 0700  ceFAZolin (ANCEF) IVPB 1 g/50 mL premix    Note to Pharmacy:  Send with pt to OR   1 g 100 mL/hr over 30 Minutes Intravenous On call 04/16/17 1905 04/17/17 1235   04/16/17 1700  piperacillin-tazobactam (ZOSYN) IVPB 3.375 g     3.375 g 12.5 mL/hr over 240 Minutes Intravenous  Every 8 hours 04/16/17 1650        Medications   Scheduled Meds: . amiodarone  200 mg Oral Daily  . amitriptyline  10 mg Oral QHS  . aspirin EC  81 mg Oral Daily  . bisacodyl  10 mg Oral QODAY  . cholecalciferol  1,000 Units Oral Daily  . ferrous sulfate  325 mg Oral BID  . furosemide  20 mg Oral Daily  . gabapentin  100 mg Oral QID  . gabapentin  600 mg Oral QID  . glimepiride  2 mg Oral Q breakfast  . GLUCERNA  237 mL Oral BID BM  . insulin aspart  0-9 Units Subcutaneous TID WC  . levothyroxine  112 mcg Oral QAC breakfast  . losartan  25 mg Oral BID  . metoprolol tartrate  25 mg Oral BID  . multivitamin-lutein  1 capsule Oral BID  . senna-docusate  1 tablet Oral BID  . simvastatin  20 mg Oral Daily  . sodium chloride flush  10-40 mL Intracatheter Q12H  . sodium chloride flush  3 mL Intravenous Q12H  . vitamin C  500 mg Oral BID   Continuous Infusions: . sodium chloride    . piperacillin-tazobactam (ZOSYN)  IV Stopped (04/19/17 1010)   PRN Meds:.sodium chloride, acetaminophen **OR** acetaminophen, acetaminophen, hydrALAZINE, hydrALAZINE, labetalol, morphine injection, ondansetron **OR** ondansetron (ZOFRAN) IV, ondansetron (ZOFRAN) IV, oxyCODONE, oxyCODONE, senna-docusate, sodium chloride flush, sodium chloride flush, sodium chloride flush, sodium chloride flush, traMADol, zolpidem   Data Review:   Micro Results Recent Results (from the past 240 hour(s))  Surgical PCR screen     Status: None   Collection Time: 04/16/17  6:37 PM  Result Value Ref Range Status   MRSA, PCR NEGATIVE NEGATIVE Final   Staphylococcus aureus NEGATIVE NEGATIVE Final    Comment: (NOTE) The Xpert SA Assay (FDA approved for NASAL specimens in patients 80 years of age and older), is one component of a comprehensive surveillance program. It is not intended to diagnose infection nor to guide or monitor treatment. Performed at [facility], [address], [place], [place] [address]  Radiology Reports Ct Abdomen Pelvis Wo Contrast  Result Date: 04/17/2017 CLINICAL DATA:  Abdominal distension and decreased blood pressure following lower extremity angiography and bilateral common iliac artery stenting today. EXAM: CT ABDOMEN AND PELVIS WITHOUT CONTRAST TECHNIQUE: Multidetector CT imaging of the abdomen and pelvis was performed following the standard protocol without IV contrast. COMPARISON:  01/21/2017 FINDINGS: Lower chest: Mild motion artifact. Subsegmental atelectasis in the lung bases. No pleural effusion. Coronary artery atherosclerosis. Hepatobiliary: No focal liver abnormality is identified. There is no significant biliary dilatation status post cholecystectomy. Pancreas: 3.3 x 2.8 cm low-density/cystic mass in the pancreatic body is minimally larger than on the prior study. Smaller cystic lesions more distally in the pancreatic body and tail are similar to the prior study measuring up to 2.2 cm in size. No acute peripancreatic inflammatory changes. Spleen: Unremarkable. Adrenals/Urinary Tract: Unremarkable adrenal glands. Excreted contrast in the renal collecting systems and bladder. No hydronephrosis. Unchanged left lower pole renal scarring. Unchanged 1.3 cm left upper pole renal cyst and 8 mm left upper pole angiomyolipoma. Subcentimeter hypodense lesions in both kidneys are too small to fully characterize. Stomach/Bowel: The stomach is within normal limits. There is no evidence of bowel obstruction. A moderately large amount of stool is present in the rectum. Prior appendectomy. Vascular/Lymphatic: Abdominal aortic atherosclerosis without aneurysm. Bilateral common iliac artery stents extending into the distal abdominal aorta. No retroperitoneal hemorrhage about the abdominal aorta or iliac artery stents. Mildly prominent gastrohepatic and periportal lymph nodes measure up to 10 mm in short axis and are overall stable to minimally larger. Reproductive: Limited assessment due to  artifact from bilateral hip arthroplasties. There is likely an atrophic uterus. Ovaries are grossly unremarkable. Other: No intraperitoneal free fluid. Small to moderate volume hematoma in the right inguinal region extending to the inferior aspect of the iliac fossa, partially obscured by streak artifact. Subcutaneous stranding extends inferiorly and medially into the groin. Musculoskeletal: Old lower anterior rib fractures on the right. Bilateral hip arthroplasties. Old fractures of the inferior left pubic ramus and left ilium extending to the SI joint. Diffuse thoracolumbar disc degeneration. IMPRESSION: 1. Small to moderate volume hematoma in the right inguinal region mildly extending into the iliac fossa. 2. Stable to minimally increased size of multilocular cystic pancreatic body lesion with similar appearance of smaller cystic lesions in the pancreatic tail. Abdominal MRI is recommended on a nonemergent basis when the patient is able to follow breath hold instructions. 3. Stable to minimally increased size of upper abdominal lymph nodes. 4.  Aortic Atherosclerosis (ICD10-I70.0). Electronically Signed   By: Logan Bores M.D.   On: 04/17/2017 16:27   Mr Foot Right Wo Contrast  Result Date: 03/20/2017 CLINICAL DATA:  Diabetic with foot ulcerations noted increasing erythema and swelling with drainage. EXAM: MRI OF THE RIGHT FOREFOOT WITHOUT CONTRAST TECHNIQUE: Multiplanar, multisequence MR imaging of the right forefoot was performed. No intravenous contrast was administered. COMPARISON:  None. FINDINGS: Bones/Joint/Cartilage Marrow edema of the great toe is identified involving the distal phalanx and head of the first proximal phalanx, series 7, image 24 suspicious for changes of acute osteomyelitis. Additionally there is intraosseous elevated T2 signal within the distal first metatarsal associated with a cortical defect along the plantar aspect suspicious for a cloaca from chronic osteomyelitis. Callus is  noted about a healing distal third metatarsal fracture involving the neck. Small focus of T2 hyperintensities seen that could represent a small posttraumatic cyst given its wall circumscribed margins. Ligaments No focal abnormality Muscles and Tendons No  intramuscular mass or fluid collection. Focal abnormality of the extensor and flexor tendons crossing the forefoot. Soft tissues Plantar soft tissue ulceration of the forefoot adjacent to the first MTP articulation. Diffuse soft tissue edema of the included forefoot consistent with cellulitis and/or third spacing of fluid. IMPRESSION: 1. Diffuse soft tissue swelling of the included forefoot without focal drainable fluid collection or abscess identified. 2. Marrow abnormality of the distal phalanx and head of first proximal phalanx of the great toe associated with soft tissue ulceration along the plantar aspect the forefoot at the level of the first metatarsal head. Findings are concerning for changes of acute osteomyelitis. 3. Possible cloaca involving the distal first metatarsal with intraosseous elevated T2 signal suspicious for changes of chronic osteomyelitis. 4. Healing third metatarsal neck fracture with small focus of T2 hyperintensity more likely related to posttraumatic degenerative cyst though the possibility of chronic osteomyelitis is not completely excluded as well. Electronically Signed   By: Ashley Royalty M.D.   On: 03/20/2017 22:53   Dg Chest Port 1 View  Result Date: 03/23/2017 CLINICAL DATA:  84 year old female admitted with sepsis and right foot osteomyelitis, postoperative day 2 status post amputation of the right foot 1st ray and midfoot debridement. EXAM: PORTABLE CHEST 1 VIEW COMPARISON:  03/19/2017 and earlier. FINDINGS: Portable AP upright view at 0700 hours. Stable lung volumes. Stable cardiac size and mediastinal contours. Visualized tracheal air column is within normal limits. No pneumothorax. No pulmonary edema or pleural effusion.  Mildly increased left greater than right upper lung interstitial opacity. No other confluent opacity. Calcified aortic atherosclerosis. IMPRESSION: 1. Mild asymmetric and increased upper lung interstitial opacity since 03/19/2017. Consider developing bilateral respiratory infection. 2. No pleural effusion or other acute cardiopulmonary abnormality. Electronically Signed   By: Genevie Ann M.D.   On: 03/23/2017 07:56   [place] Ekg Site Rite  Result Date: 03/23/2017 If Site Rite image not attached, placement could not be confirmed due to current cardiac rhythm.    CBC Recent Labs  Lab 04/16/17 1727 04/19/17 0339  WBC 6.5 7.5  HGB 11.2* 9.3*  HCT 35.2 29.0*  PLT 231 226  MCV 81.5 81.5  MCH 26.0 26.1  MCHC 31.9* 32.0  RDW 23.8* 23.9*    Chemistries  Recent Labs  Lab 04/16/17 1727 04/19/17 0339  NA 137 136  K 4.3 4.1  CL 105 106  CO2 26 23  GLUCOSE 140* 191*  BUN 31* 21*  CREATININE 1.24* 1.42*  CALCIUM 9.0 8.7*   ------------------------------------------------------------------------------------------------------------------ estimated creatinine clearance is 28 mL/min (A) (by C-G formula based on SCr of 1.42 mg/dL (H)). ------------------------------------------------------------------------------------------------------------------ No results for input(s): HGBA1C in the last 72 hours. ------------------------------------------------------------------------------------------------------------------ No results for input(s): CHOL, HDL, LDLCALC, TRIG, CHOLHDL, LDLDIRECT in the last 72 hours. ------------------------------------------------------------------------------------------------------------------ No results for input(s): TSH, T4TOTAL, T3FREE, THYROIDAB in the last 72 hours.  Invalid input(s): FREET3 ------------------------------------------------------------------------------------------------------------------ No results for input(s): VITAMINB12, FOLATE, FERRITIN, TIBC, IRON,  RETICCTPCT in the last 72 hours.  Coagulation profile No results for input(s): INR, PROTIME in the last 168 hours.  No results for input(s): DDIMER in the last 72 hours.  Cardiac Enzymes No results for input(s): CKMB, TROPONINI, MYOGLOBIN in the last 168 hours.  Invalid input(s): CK ------------------------------------------------------------------------------------------------------------------ Invalid input(s): POCBNP    Assessment & Plan   84 year old elderly female patient with history of atrial fibrillation, diabetes mellitus, peripheral vascular disease was admitted directly from podiatry office for nonhealing right foot ulcer.  Patient had a wound dehiscence.  1.  Nonhealing  right foot ulcer Status post debridement and irrigation Continue wound VAC Wound care consultation daily dressings Status post angiogram and stent placements by vascular surgery for atherosclerotic occlusive disease bilateral lower extremities Continue IV Zosyn antibiotic  2.  Diabetes mellitus type 2 Continue sliding scale coverage with insulin Diabetic diet  3.  Chronic stable diastolic heart failure Compensated  4.  Paroxysmal atrial fibrillation On oral amiodarone and aspirin  5.  Vulvar and labial edema and hematoma  appreciate OB/GYN consultation Follow-up clinically Monitor hemoglobin hematocrit No voiding difficulty as of present      Code Status Orders  (From admission, onward)        Start     Ordered   04/16/17 1706  Full code  Continuous     04/16/17 1705    Code Status History    Date Active Date Inactive Code Status Order ID Comments User Context   03/23/2017 1422 03/23/2017 2233 Full Code [id number]  Gladstone Lighter, MD Inpatient   03/20/2017 1121 03/23/2017 1422 DNR [id number]  Bettey Costa, MD Inpatient   03/20/2017 0140 03/20/2017 1121 Full Code [id number]  Lance Coon, MD ED   07/07/2015 0353 07/09/2015 1520 Full Code [id number]  Saundra Shelling, MD Inpatient   02/07/2015  1605 02/12/2015 1921 Full Code [id number]  Dustin Flock, MD Inpatient   11/05/2014 1551 11/09/2014 1536 Full Code [id number]  Gladstone Lighter, MD Inpatient   10/10/2014 1112 10/17/2014 1750 Full Code [id number]  Epifanio Lesches, MD Inpatient    Advance Directive Documentation     Most Recent Value  Type of Advance Directive  Healthcare Power of Attorney  Pre-existing out of facility DNR order (yellow form or pink MOST form)  -  "MOST" Form in Place?  -      Time Spent in minutes   35  Greater than 50% of time spent in care coordination and counseling patient regarding the condition and plan of care.   Saundra Shelling M.D on 04/19/2017 at 1:35 PM  Between 7am to 6pm - Pager - [phone]  After 6pm go to [email or web address] - Proofreader  [employer]   Office  [phone]

## 2017-04-19 NOTE — Consult Note (Signed)
Obstetrics & Gynecology Consult Note  Date of Consultation: 04/19/2017   Requesting Provider: Dr Elvina Mattes Primary OBGYN: None Primary Care Provider: Rusty Aus  Reason for Consultation: Vulvar bruising and Edema  History of Present Illness: Ms. Sharon [name] is a 84 y.o. WF with the above CC. Pt had a vascular procedure performed 2 days ago with bilateral groin involvement, for peripheral artery disease and right foot sequele.  Today she and nurse noted swelling and bruising along the groin and into the mons pubis and labia.  Pt denies pain.  Pt able to void without difficulty.  Pt reports h/o easy brusing, also edema.  Denies recent trauma.  ROS: A review of systems was performed and was complete and comprehensive, except as stated in the above HPI.  Past Medical History: Past Medical History:  Diagnosis Date  . Anemia   . Atrial fibrillation ([place])   . Cardiac arrest ([place])   . Cataract   . CHF (congestive heart failure) ([place])   . Diabetes mellitus without complication ([place])   . Edema    feet/ankles occas  . Hip fracture ([place])   . Hyperlipidemia   . Hypertension   . Hypothyroid   . Neuropathy   . Osteomyelitis ([place])    left first metatarsal  . Stroke [facility])    Past Surgical History: Past Surgical History:  Procedure Laterality Date  . ACHILLES TENDON SURGERY Left 02/08/2015   Procedure: ACHILLES LENGTHENING/KIDNER;  Surgeon: Albertine Patricia, DPM;  Location: [facility] ORS;  Service: Podiatry;  Laterality: Left;  . APPENDECTOMY    . CARDIAC CATHETERIZATION  08/25/13  . CATARACT EXTRACTION W/PHACO Left 07/20/2014   Procedure: CATARACT EXTRACTION PHACO AND INTRAOCULAR LENS PLACEMENT (IOC);  Surgeon: Leandrew Koyanagi, MD;  Location: [facility] ORS;  Service: Ophthalmology;  Laterality: Left;  [place]  1:18                 AP     23.6             CDE   9.69      lot #[id number]  . CHOLECYSTECTOMY    . CORONARY ANGIOPLASTY    . HALLUX VALGUS AKIN Left 02/08/2015   Procedure: HALLUX VALGUS AKIN/ KELLER;   Surgeon: Albertine Patricia, DPM;  Location: [facility] ORS;  Service: Podiatry;  Laterality: Left;  IVA with Local needs 1 hour for this case   . HEMIARTHROPLASTY HIP Right   . HEMIARTHROPLASTY HIP Left   . INCISION AND DRAINAGE Left 10/11/2014   Procedure: Removal of infected tibial sessmoid;  Surgeon: Albertine Patricia, DPM;  Location: [facility] ORS;  Service: Podiatry;  Laterality: Left;  . IRRIGATION AND DEBRIDEMENT FOOT Right 03/21/2017   Procedure: IRRIGATION AND DEBRIDEMENT FOOT right great toe amputation;  Surgeon: Sharlotte Alamo, DPM;  Location: [facility] ORS;  Service: Podiatry;  Laterality: Right;  . IRRIGATION AND DEBRIDEMENT FOOT Right 04/18/2017   Procedure: IRRIGATION AND DEBRIDEMENT FOOT and application wound vac;  Surgeon: Albertine Patricia, DPM;  Location: [facility] ORS;  Service: Podiatry;  Laterality: Right;   Family History:  Family History  Problem Relation Age of Onset  . Heart attack Mother   She denies any female cancers, bleeding or blood clotting disorders.   Social History:  Social History   Socioeconomic History  . Marital status: Widowed    Spouse name: Not on file  . Number of children: Not on file  . Years of education: Not on file  . Highest education level: Not on file  Occupational History  .  Occupation: retired.  Social Needs  . Financial resource strain: Not on file  . Food insecurity:    Worry: Not on file    Inability: Not on file  . Transportation needs:    Medical: Not on file    Non-medical: Not on file  Tobacco Use  . Smoking status: Former Smoker    Packs/day: 0.50    Years: 7.00    Pack years: 3.50    Types: Cigarettes    Last attempt to quit: 05/30/1989    Years since quitting: 27.9  . Smokeless tobacco: Never Used  Substance and Sexual Activity  . Alcohol use: No    Alcohol/week: 0.0 oz  . Drug use: No  . Sexual activity: Not on file  Lifestyle  . Physical activity:    Days per week: Not on file    Minutes per session: Not on file  . Stress: Not on  file  Relationships  . Social connections:    Talks on phone: Not on file    Gets together: Not on file    Attends religious service: Not on file    Active member of club or organization: Not on file    Attends meetings of clubs or organizations: Not on file    Relationship status: Not on file  . Intimate partner violence:    Fear of current or ex partner: Not on file    Emotionally abused: Not on file    Physically abused: Not on file    Forced sexual activity: Not on file  Other Topics Concern  . Not on file  Social History Narrative   Lives at home by herself.   Ambulates with a walker   Allergy: Allergies  Allergen Reactions  . Carbamazepine Other (See Comments)    Reaction: unknown  . Cymbalta [Duloxetine Hcl] Other (See Comments)    Confusion, disorientation  . Duloxetine     Other reaction(s): Other (See Comments) Confusion, disorientation  . Lyrica [Pregabalin] Other (See Comments)    Patient and son states this makes the patient confused.   Current Outpatient Medications: Medications Prior to Admission  Medication Sig Dispense Refill Last Dose  . acetaminophen (TYLENOL) 500 MG tablet Take 500 mg by mouth every 4 (four) hours as needed for mild pain or moderate pain.   04/16/2017 at 1326  . Amino Acids-Protein Hydrolys (FEEDING SUPPLEMENT, PRO-STAT SUGAR FREE 64,) LIQD Take 30 mLs by mouth 2 (two) times daily between meals.   04/16/2017 at 0957  . amiodarone (PACERONE) 200 MG tablet Take 1 tablet (200 mg total) by mouth daily. 30 tablet 2 04/15/2017 at 1736  . amitriptyline (ELAVIL) 10 MG tablet Take 10 mg by mouth at bedtime.   04/15/2017 at 2009  . aspirin 81 MG tablet Take 81 mg by mouth daily.    04/16/2017 at [place]  . cholecalciferol (VITAMIN D) 1000 units tablet Take 1,000 Units by mouth daily.   04/16/2017 at [place]  . ferrous sulfate 325 (65 FE) MG tablet Take 325 mg by mouth 2 (two) times daily.   04/16/2017 at 1147  . furosemide (LASIX) 20 MG tablet Take 1 tablet (20 mg  total) by mouth daily. 30 tablet 0 04/16/2017 at [place]  . gabapentin (NEURONTIN) 100 MG capsule Take 100 mg by mouth 4 (four) times daily.   04/16/2017 at 1147  . gabapentin (NEURONTIN) 600 MG tablet Take 600 mg by mouth 4 (four) times daily.    04/16/2017 at 1147  . glimepiride (AMARYL) 2  MG tablet Take 2 mg by mouth daily with breakfast.    04/16/2017 at 0848  . levothyroxine (SYNTHROID, LEVOTHROID) 112 MCG tablet Take 112 mcg by mouth daily before breakfast. 30 minutes before breakfast   04/16/2017 at 0548  . losartan (COZAAR) 25 MG tablet Take 25 mg by mouth 2 (two) times daily.   04/16/2017 at [place]  . Multiple Vitamins-Minerals (PRESERVISION AREDS 2+MULTI VIT PO) Take 1 capsule by mouth 2 (two) times daily.    04/16/2017 at [place]  . nystatin cream (MYCOSTATIN) Apply 1 application topically 2 (two) times daily.   04/16/2017 at 1140  . oxyCODONE (OXY IR/ROXICODONE) 5 MG immediate release tablet Take 1 tablet (5 mg total) by mouth every 4 (four) hours as needed for moderate pain or severe pain. 30 tablet 0 04/15/2017 at 1736  . piperacillin-tazobactam (ZOSYN) IVPB Inject 3.375 g into the vein every 6 (six) hours.   04/16/2017 at 1147  . potassium chloride (MICRO-K) 10 MEQ CR capsule Take 10 mEq by mouth daily.   04/16/2017 at [place]  . senna-docusate (SENOKOT-S) 8.6-50 MG tablet Take 1 tablet by mouth 2 (two) times daily. 30 tablet 2 04/16/2017 at 0809  . simvastatin (ZOCOR) 20 MG tablet Take 20 mg by mouth daily.   04/15/2017 at 2009  . Skin Protectants, Misc. (ENDIT EX) Apply 1 application topically 2 (two) times daily.   04/16/2017 at 1139  . traMADol (ULTRAM) 50 MG tablet Take 50 mg by mouth every 6 (six) hours as needed.   prn at prn  . vitamin C (ASCORBIC ACID) 500 MG tablet Take 500 mg by mouth 2 (two) times daily.   04/16/2017 at 1147  . bisacodyl (DULCOLAX) 5 MG EC tablet Take 2 tablets (10 mg total) by mouth every other day. 30 tablet 0 04/06/2017 at prn  . GLUCERNA (GLUCERNA) LIQD Take 237 mLs by mouth 2 (two) times  daily between meals.   Taking  . metoprolol tartrate (LOPRESSOR) 25 MG tablet Take 1 tablet (25 mg total) by mouth 2 (two) times daily. 60 tablet 0 04/13/2017    Hospital Medications: Current Facility-Administered Medications  Medication Dose Route Frequency Provider Last Rate Last Dose  . 0.9 %  sodium chloride infusion  250 mL Intravenous PRN Schnier, Dolores Lory, MD      . acetaminophen (TYLENOL) tablet 650 mg  650 mg Oral Q6H PRN Schnier, Dolores Lory, MD       Or  . acetaminophen (TYLENOL) suppository 650 mg  650 mg Rectal Q6H PRN Schnier, Dolores Lory, MD      . acetaminophen (TYLENOL) tablet 650 mg  650 mg Oral Q4H PRN Schnier, Dolores Lory, MD      . amiodarone (PACERONE) tablet 200 mg  200 mg Oral Daily Schnier, Dolores Lory, MD   200 mg at 04/19/17 0851  . amitriptyline (ELAVIL) tablet 10 mg  10 mg Oral QHS Schnier, Dolores Lory, MD   10 mg at 04/18/17 2112  . aspirin EC tablet 81 mg  81 mg Oral Daily Schnier, Dolores Lory, MD   81 mg at 04/19/17 0849  . bisacodyl (DULCOLAX) EC tablet 10 mg  10 mg Oral Beverly Gust Schnier, Dolores Lory, MD   10 mg at 04/16/17 2216  . cholecalciferol (VITAMIN D) tablet 1,000 Units  1,000 Units Oral Daily Schnier, Dolores Lory, MD   1,000 Units at 04/19/17 0848  . ferrous sulfate tablet 325 mg  325 mg Oral BID Delana Meyer Dolores Lory, MD   325 mg at 04/19/17 0849  .  furosemide (LASIX) tablet 20 mg  20 mg Oral Daily Schnier, Dolores Lory, MD   20 mg at 04/19/17 0850  . gabapentin (NEURONTIN) capsule 100 mg  100 mg Oral QID Schnier, Dolores Lory, MD   100 mg at 04/19/17 0851  . gabapentin (NEURONTIN) capsule 600 mg  600 mg Oral QID Schnier, Dolores Lory, MD   600 mg at 04/19/17 0849  . glimepiride (AMARYL) tablet 2 mg  2 mg Oral Q breakfast Schnier, Dolores Lory, MD   2 mg at 04/19/17 0855  . GLUCERNA liquid 237 mL  237 mL Oral BID BM Schnier, Dolores Lory, MD   237 mL at 04/18/17 1439  . hydrALAZINE (APRESOLINE) injection 10 mg  10 mg Intravenous Q4H PRN Schnier, Dolores Lory, MD   10 mg at 04/16/17 2334  .  hydrALAZINE (APRESOLINE) injection 5 mg  5 mg Intravenous Q20 Min PRN Schnier, Dolores Lory, MD      . insulin aspart (novoLOG) injection 0-9 Units  0-9 Units Subcutaneous TID WC Schnier, Dolores Lory, MD   1 Units at 04/19/17 [phone]  . labetalol (NORMODYNE,TRANDATE) injection 10 mg  10 mg Intravenous Q10 min PRN Schnier, Dolores Lory, MD      . levothyroxine (SYNTHROID, LEVOTHROID) tablet 112 mcg  112 mcg Oral QAC breakfast Schnier, Dolores Lory, MD   112 mcg at 04/19/17 0855  . losartan (COZAAR) tablet 25 mg  25 mg Oral BID Delana Meyer Dolores Lory, MD   25 mg at 04/19/17 0850  . metoprolol tartrate (LOPRESSOR) tablet 25 mg  25 mg Oral BID Delana Meyer Dolores Lory, MD   25 mg at 04/19/17 0850  . morphine 4 MG/ML injection 2 mg  2 mg Intravenous Q1H PRN Schnier, Dolores Lory, MD      . multivitamin-lutein (OCUVITE-LUTEIN) capsule 1 capsule  1 capsule Oral BID Schnier, Dolores Lory, MD   1 capsule at 04/19/17 0856  . ondansetron (ZOFRAN) tablet 4 mg  4 mg Oral Q6H PRN Schnier, Dolores Lory, MD       Or  . ondansetron [facility]) injection 4 mg  4 mg Intravenous Q6H PRN Schnier, Dolores Lory, MD      . ondansetron [facility]) injection 4 mg  4 mg Intravenous Q6H PRN Schnier, Dolores Lory, MD      . oxyCODONE (Oxy IR/ROXICODONE) immediate release tablet 5 mg  5 mg Oral Q4H PRN Schnier, Dolores Lory, MD   5 mg at 04/17/17 1745  . oxyCODONE (Oxy IR/ROXICODONE) immediate release tablet 5-10 mg  5-10 mg Oral Q4H PRN Schnier, Dolores Lory, MD      . piperacillin-tazobactam (ZOSYN) IVPB 3.375 g  3.375 g Intravenous Q8H Schnier, Dolores Lory, MD 12.5 mL/hr at 04/19/17 0554 3.375 g at 04/19/17 0554  . senna-docusate (Senokot-S) tablet 1 tablet  1 tablet Oral QHS PRN Schnier, Dolores Lory, MD      . senna-docusate (Senokot-S) tablet 1 tablet  1 tablet Oral BID Schnier, Dolores Lory, MD   1 tablet at 04/19/17 0849  . simvastatin (ZOCOR) tablet 20 mg  20 mg Oral Daily Schnier, Dolores Lory, MD   20 mg at 04/18/17 1147  . sodium chloride flush (NS) 0.9 % injection 10 mL  10  mL Intracatheter PRN Karen Kitchens, NP      . sodium chloride flush (NS) 0.9 % injection 10-40 mL  10-40 mL Intracatheter Q12H Schnier, Dolores Lory, MD   10 mL at 04/19/17 0857  . sodium chloride flush (NS) 0.9 % injection 10-40 mL  10-40  mL Intracatheter PRN Schnier, Dolores Lory, MD      . sodium chloride flush (NS) 0.9 % injection 3 mL  3 mL Intravenous Q12H Schnier, Dolores Lory, MD   3 mL at 04/19/17 0858  . sodium chloride flush (NS) 0.9 % injection 3 mL  3 mL Intravenous PRN Schnier, Dolores Lory, MD      . sodium chloride flush (NS) 0.9 % injection 3 mL  3 mL Intracatheter PRN Karen Kitchens, NP      . traMADol Veatrice Bourbon) tablet 50 mg  50 mg Oral Q6H PRN Schnier, Dolores Lory, MD   50 mg at 04/17/17 1009  . vitamin C (ASCORBIC ACID) tablet 500 mg  500 mg Oral BID Schnier, Dolores Lory, MD   500 mg at 04/19/17 0855  . zolpidem (AMBIEN) tablet 5 mg  5 mg Oral QHS PRN Schnier, Dolores Lory, MD   5 mg at 04/18/17 2342   Facility-Administered Medications Ordered in Other Encounters  Medication Dose Route Frequency Provider Last Rate Last Dose  . sodium chloride flush (NS) 0.9 % injection 10 mL  10 mL Intracatheter PRN Karen Kitchens, NP       Physical Exam: Vitals:   04/18/17 2031 04/18/17 2316 04/19/17 0339 04/19/17 0759  BP: (!) 170/55 (!) 161/46 (!) 154/52 (!) 169/54  Pulse: 67 64 64 (!) 58  Resp: 18 18 18    Temp: 98.5 F (36.9 C) 98.1 F (36.7 C) 97.6 F (36.4 C) 97.9 F (36.6 C)  TempSrc: Oral Oral Oral Oral  SpO2: 98% 91% 97% 97%  Weight:      Height:       Body mass index is 23.46 kg/m. Constitutional: Well nourished, well developed female in no acute distress.  HEENT: normal Neck:  Supple, normal appearance, and no thyromegaly  Cardiovascular:Regular rate and rhythm.  No murmurs, rubs or gallops. Respiratory:  Clear to auscultation bilateral. Normal respiratory effort Abdomen: positive bowel sounds and no masses, hernias; diffusely non tender to palpation, non distended Neuro: grossly  intact Psych:  Normal mood and affect.  Skin:  Warm and dry.  MS: normal bilateral lower extremity strength/ROM/symmetry Lymphatic:  No inguinal lymphadenopathy.   Pelvic exam: is not limited by body habitus EGBUS: patient has asymmetric edema in mons and vulva, with some bruising noted as well; she has bruising as well all along bilateral groin areas Vagina: within normal limits. Bladder and Urethra: normal.  No obstruction. Bimanual exam (internal) deferred  Recent Labs  Lab 04/16/17 1727 04/19/17 0339  WBC 6.5 7.5  HGB 11.2* 9.3*  HCT 35.2 29.0*  PLT 231 226   Recent Labs  Lab 04/16/17 1727 04/19/17 0339  NA 137 136  K 4.3 4.1  CL 105 106  CO2 26 23  BUN 31* 21*  CREATININE 1.24* 1.42*  CALCIUM 9.0 8.7*  GLUCOSE 140* 191*   Assessment: Ms. Grange is a 84 y.o. WF who presents with noted complaints of bruising and swelling; findings are consistent with post procedural Edema and Easy Bruising.  Plan: Monitor and ensure no worsening to the point of urinary outflow obstruction. Alert vascular to any concerns with bruising or bleeding at procedure site. Edema is dependant, as she is reclined and immobilized at this point (usually an active person). Let me know any further concerns.  Barnett Applebaum, MD, [facility], [facility] 04/19/2017  10:44 AM Pager [phone]

## 2017-04-20 ENCOUNTER — Encounter: Payer: Self-pay | Admitting: Vascular Surgery

## 2017-04-20 LAB — GLUCOSE, CAPILLARY
GLUCOSE-CAPILLARY: 190 mg/dL — AB (ref 65–99)
Glucose-Capillary: 100 mg/dL — ABNORMAL HIGH (ref 65–99)
Glucose-Capillary: 171 mg/dL — ABNORMAL HIGH (ref 65–99)
Glucose-Capillary: 214 mg/dL — ABNORMAL HIGH (ref 65–99)

## 2017-04-20 LAB — CBC
HEMATOCRIT: 30.3 % — AB (ref 35.0–47.0)
Hemoglobin: 9.6 g/dL — ABNORMAL LOW (ref 12.0–16.0)
MCH: 25.8 pg — ABNORMAL LOW (ref 26.0–34.0)
MCHC: 31.5 g/dL — AB (ref 32.0–36.0)
MCV: 81.8 fL (ref 80.0–100.0)
Platelets: 227 10*3/uL (ref 150–440)
RBC: 3.71 MIL/uL — ABNORMAL LOW (ref 3.80–5.20)
RDW: 22.9 % — AB (ref 11.5–14.5)
WBC: 7.6 10*3/uL (ref 3.6–11.0)

## 2017-04-20 NOTE — Care Management Important Message (Signed)
Important Message  Patient Details  Name: Sharon [name] MRN: [id number] Date of Birth: 12/28/1933   Medicare Important Message Given:  Yes    Juliann Pulse A Mikita Lesmeister 04/20/2017, 10:44 AM

## 2017-04-20 NOTE — Progress Notes (Signed)
Per [facility] admissions coordinator at [facility] patient did not do a bed hold and she will not be able to return. Clinical Social Worker (CSW) met with patient and made her aware of above. CSW presented other bed offers. Patient requested that CSW call her son [name]. CSW contacted [name] and made him aware of above and gave offers. Per son he will call CSW back with a bed choice.  [employer], LCSW [phone]

## 2017-04-20 NOTE — Progress Notes (Signed)
[facility] Podiatry                                                      Patient Demographics  Sharon [name], is a 84 y.o. female   MRN: [id number]   DOB - [birth year]  Admit Date - 04/16/2017    Outpatient Primary MD for the patient is Rusty Aus, MD With History of -  Past Medical History:  Diagnosis Date  . Anemia   . Atrial fibrillation ([place])   . Cardiac arrest ([place])   . Cataract   . CHF (congestive heart failure) ([place])   . Diabetes mellitus without complication ([place])   . Edema    feet/ankles occas  . Hip fracture ([place])   . Hyperlipidemia   . Hypertension   . Hypothyroid   . Neuropathy   . Osteomyelitis ([place])    left first metatarsal  . Stroke [facility])       Past Surgical History:  Procedure Laterality Date  . ACHILLES TENDON SURGERY Left 02/08/2015   Procedure: ACHILLES LENGTHENING/KIDNER;  Surgeon: Albertine Patricia, DPM;  Location: [facility] ORS;  Service: Podiatry;  Laterality: Left;  . APPENDECTOMY    . CARDIAC CATHETERIZATION  08/25/13  . CATARACT EXTRACTION W/PHACO Left 07/20/2014   Procedure: CATARACT EXTRACTION PHACO AND INTRAOCULAR LENS PLACEMENT (IOC);  Surgeon: Leandrew Koyanagi, MD;  Location: [facility] ORS;  Service: Ophthalmology;  Laterality: Left;  [place]  1:18                 AP     23.6             CDE   9.69      lot #[id number]  . CHOLECYSTECTOMY    . CORONARY ANGIOPLASTY    . HALLUX VALGUS AKIN Left 02/08/2015   Procedure: HALLUX VALGUS AKIN/ KELLER;  Surgeon: Albertine Patricia, DPM;  Location: [facility] ORS;  Service: Podiatry;  Laterality: Left;  IVA with Local needs 1 hour for this case   . HEMIARTHROPLASTY HIP Right   . HEMIARTHROPLASTY HIP Left   . INCISION AND DRAINAGE Left 10/11/2014   Procedure: Removal of infected tibial sessmoid;  Surgeon: Albertine Patricia, DPM;  Location: [facility] ORS;  Service: Podiatry;  Laterality: Left;  . IRRIGATION  AND DEBRIDEMENT FOOT Right 03/21/2017   Procedure: IRRIGATION AND DEBRIDEMENT FOOT right great toe amputation;  Surgeon: Sharlotte Alamo, DPM;  Location: [facility] ORS;  Service: Podiatry;  Laterality: Right;  . IRRIGATION AND DEBRIDEMENT FOOT Right 04/18/2017   Procedure: IRRIGATION AND DEBRIDEMENT FOOT and application wound vac;  Surgeon: Albertine Patricia, DPM;  Location: [facility] ORS;  Service: Podiatry;  Laterality: Right;    in for   No chief complaint on file.    HPI  Sharon [name]  is a 84 y.o. female, 2 days postop following incision and drainage and debridement of a deep wound on the right foot.  She is 3 days status post angioplasty of the right lower extremity    Review of Systems    In addition to the HPI above,  No Fever-chills, No Headache, No changes with Vision or hearing, No problems swallowing food or Liquids, No Chest pain, Cough or Shortness of Breath, No Abdominal pain, No Nausea or Vommitting, Bowel movements are regular, No Blood in stool or Urine, No dysuria, No new skin rashes or bruises,  No new joints pains-aches,  No new weakness, tingling, numbness in any extremity, No recent weight gain or loss, No polyuria, polydypsia or polyphagia, No significant Mental Stressors.  A full 10 point Review of Systems was done, except as stated above, all other Review of Systems were negative.   Social History Social History   Tobacco Use  . Smoking status: Former Smoker    Packs/day: 0.50    Years: 7.00    Pack years: 3.50    Types: Cigarettes    Last attempt to quit: 05/30/1989    Years since quitting: 27.9  . Smokeless tobacco: Never Used  Substance Use Topics  . Alcohol use: No    Alcohol/week: 0.0 oz    Family History Family History  Problem Relation Age of Onset  . Heart attack Mother     Prior to Admission medications   Medication Sig Start Date End Date Taking? Authorizing Provider  acetaminophen (TYLENOL) 500 MG tablet Take 500 mg by mouth every 4  (four) hours as needed for mild pain or moderate pain.   Yes [provider]  Amino Acids-Protein Hydrolys (FEEDING SUPPLEMENT, PRO-STAT SUGAR FREE 64,) LIQD Take 30 mLs by mouth 2 (two) times daily between meals.   Yes [provider]  amiodarone (PACERONE) 200 MG tablet Take 1 tablet (200 mg total) by mouth daily. 03/24/17  Yes Gladstone Lighter, MD  amitriptyline (ELAVIL) 10 MG tablet Take 10 mg by mouth at bedtime.   Yes [provider]  aspirin 81 MG tablet Take 81 mg by mouth daily.    Yes [provider]  cholecalciferol (VITAMIN D) 1000 units tablet Take 1,000 Units by mouth daily.   Yes [provider]  ferrous sulfate 325 (65 FE) MG tablet Take 325 mg by mouth 2 (two) times daily.   Yes [provider]  furosemide (LASIX) 20 MG tablet Take 1 tablet (20 mg total) by mouth daily. 03/23/17  Yes Gladstone Lighter, MD  gabapentin (NEURONTIN) 100 MG capsule Take 100 mg by mouth 4 (four) times daily.   Yes [provider]  gabapentin (NEURONTIN) 600 MG tablet Take 600 mg by mouth 4 (four) times daily.    Yes [provider]  glimepiride (AMARYL) 2 MG tablet Take 2 mg by mouth daily with breakfast.    Yes [provider]  levothyroxine (SYNTHROID, LEVOTHROID) 112 MCG tablet Take 112 mcg by mouth daily before breakfast. 30 minutes before breakfast   Yes [provider]  losartan (COZAAR) 25 MG tablet Take 25 mg by mouth 2 (two) times daily.   Yes [provider]  Multiple Vitamins-Minerals (PRESERVISION AREDS 2+MULTI VIT PO) Take 1 capsule by mouth 2 (two) times daily.    Yes [provider]  nystatin cream (MYCOSTATIN) Apply 1 application topically 2 (two) times daily.   Yes [provider]  oxyCODONE (OXY IR/ROXICODONE) 5 MG immediate release tablet Take 1 tablet (5 mg total) by mouth every 4 (four) hours as needed for moderate pain or severe pain. 03/23/17  Yes Gladstone Lighter, MD   piperacillin-tazobactam (ZOSYN) IVPB Inject 3.375 g into the vein every 6 (six) hours.   Yes [provider]  potassium chloride (MICRO-K) 10 MEQ CR capsule Take 10 mEq by mouth daily.   Yes [provider]  senna-docusate (SENOKOT-S) 8.6-50 MG tablet Take 1 tablet by mouth 2 (two) times daily. 03/23/17  Yes Gladstone Lighter, MD  simvastatin (ZOCOR) 20 MG tablet Take 20 mg by mouth daily.  Yes [provider]  Skin Protectants, Misc. (ENDIT EX) Apply 1 application topically 2 (two) times daily.   Yes [provider]  traMADol (ULTRAM) 50 MG tablet Take 50 mg by mouth every 6 (six) hours as needed.   Yes [provider]  vitamin C (ASCORBIC ACID) 500 MG tablet Take 500 mg by mouth 2 (two) times daily.   Yes [provider]  bisacodyl (DULCOLAX) 5 MG EC tablet Take 2 tablets (10 mg total) by mouth every other day. 03/25/17   Gladstone Lighter, MD  GLUCERNA (GLUCERNA) LIQD Take 237 mLs by mouth 2 (two) times daily between meals.    [provider]  metoprolol tartrate (LOPRESSOR) 25 MG tablet Take 1 tablet (25 mg total) by mouth 2 (two) times daily. 03/23/17   Gladstone Lighter, MD    Anti-infectives (From admission, onward)   Start     Dose/Rate Route Frequency Ordered Stop   04/17/17 0700  ceFAZolin (ANCEF) IVPB 1 g/50 mL premix    Note to Pharmacy:  Send with pt to OR   1 g 100 mL/hr over 30 Minutes Intravenous On call 04/16/17 1905 04/17/17 1235   04/16/17 1700  piperacillin-tazobactam (ZOSYN) IVPB 3.375 g     3.375 g 12.5 mL/hr over 240 Minutes Intravenous Every 8 hours 04/16/17 1650        Scheduled Meds: . amiodarone  200 mg Oral Daily  . amitriptyline  10 mg Oral QHS  . aspirin EC  81 mg Oral Daily  . bisacodyl  10 mg Oral QODAY  . cholecalciferol  1,000 Units Oral Daily  . ferrous sulfate  325 mg Oral BID  . furosemide  20 mg Oral Daily  . gabapentin  100 mg Oral QID  . gabapentin  600 mg Oral QID  .  glimepiride  2 mg Oral Q breakfast  . GLUCERNA  237 mL Oral BID BM  . insulin aspart  0-9 Units Subcutaneous TID WC  . levothyroxine  112 mcg Oral QAC breakfast  . losartan  25 mg Oral BID  . metoprolol tartrate  25 mg Oral BID  . multivitamin-lutein  1 capsule Oral BID  . senna-docusate  1 tablet Oral BID  . simvastatin  20 mg Oral Daily  . sodium chloride flush  10-40 mL Intracatheter Q12H  . sodium chloride flush  3 mL Intravenous Q12H  . vitamin C  500 mg Oral BID   Continuous Infusions: . sodium chloride    . piperacillin-tazobactam (ZOSYN)  IV Stopped (04/20/17 0255)   PRN Meds:.sodium chloride, acetaminophen **OR** acetaminophen, acetaminophen, hydrALAZINE, hydrALAZINE, labetalol, morphine injection, ondansetron **OR** ondansetron (ZOFRAN) IV, ondansetron (ZOFRAN) IV, oxyCODONE, oxyCODONE, senna-docusate, sodium chloride flush, sodium chloride flush, sodium chloride flush, sodium chloride flush, traMADol, zolpidem  Allergies  Allergen Reactions  . Carbamazepine Other (See Comments)    Reaction: unknown  . Cymbalta [Duloxetine Hcl] Other (See Comments)    Confusion, disorientation  . Duloxetine     Other reaction(s): Other (See Comments) Confusion, disorientation  . Lyrica [Pregabalin] Other (See Comments)    Patient and son states this makes the patient confused.    Physical Exam  Vitals  Blood pressure (!) 176/60, pulse (!) 55, temperature 97.8 F (36.6 C), temperature source Oral, resp. rate 16, height 5\' 5"  (1.651 m), weight 64 kg (141 lb), SpO2 99 %.  Lower Extremity exam: Wound VAC taken down today and the wound is checked.  It appears to be very clean still deep approximately 2-1/2 cm  in depth 3 cm in width and 3.5 cm in length.  Overall it is improving its appears to be having some granular appearance to the deep portions of the wound we need to keep up with the wound VAC. Data Review  CBC Recent Labs  Lab 04/16/17 1727 04/19/17 0339  WBC 6.5 7.5  HGB  11.2* 9.3*  HCT 35.2 29.0*  PLT 231 226  MCV 81.5 81.5  MCH 26.0 26.1  MCHC 31.9* 32.0  RDW 23.8* 23.9*   ------------------------------------------------------------------------------------------------------------------  Chemistries  Recent Labs  Lab 04/16/17 1727 04/19/17 0339  NA 137 136  K 4.3 4.1  CL 105 106  CO2 26 23  GLUCOSE 140* 191*  BUN 31* 21*  CREATININE 1.24* 1.42*  CALCIUM 9.0 8.7*   ------------------------------------------------------------------------------------------------------------------ estimated creatinine clearance is 28 mL/min (A) (by C-G formula based on SCr of 1.42 mg/dL (H)). ------------------------------------------------------------------------------------------------------------------ No results for input(s): TSH, T4TOTAL, T3FREE, THYROIDAB in the last 72 hours.  Invalid input(s): FREET3 Urinalysis    Component Value Date/Time   COLORURINE YELLOW (A) 03/19/2017 2020   APPEARANCEUR HAZY (A) 03/19/2017 2020   APPEARANCEUR Hazy 08/18/2013 0253   LABSPEC 1.012 03/19/2017 2020   LABSPEC 1.012 08/18/2013 0253   PHURINE 5.0 03/19/2017 2020   GLUCOSEU 150 (A) 03/19/2017 2020   GLUCOSEU >=500 08/18/2013 0253   HGBUR SMALL (A) 03/19/2017 2020   BILIRUBINUR NEGATIVE 03/19/2017 2020   BILIRUBINUR Negative 08/18/2013 [place] 03/19/2017 2020   PROTEINUR >=300 (A) 03/19/2017 2020   NITRITE NEGATIVE 03/19/2017 2020   LEUKOCYTESUR NEGATIVE 03/19/2017 2020   LEUKOCYTESUR Trace 08/18/2013 0253     Imaging results:   No results found.  Assessment & Plan: Took wound VAC down inspected the wound and put a dry dressing on until the nursing staff can place a new wound VAC on the region.  Okay for her to get up to transfer/PT evaluates her.  She is good to be discharged as soon as she stable with everything else.  Needs to go to rehab.  I will need to follow her in about 2 weeks to check the progression of the wound.  Active  Problems:   Non-healing ulcer ([place])   Family Communication: Plan discussed with patient and **  Albertine Patricia M.D on 04/20/2017 at 7:54 AM  Thank you for the consult, we will follow the patient with you in the Hospital.

## 2017-04-20 NOTE — Progress Notes (Signed)
Physical Therapy Evaluation Patient Details Name: Sharon [name] MRN: [id number] DOB: 1933/08/23 Today's Date: 04/20/2017   History of Present Illness  Sharon [name]  is a 84 y.o. female with a known history of atrial fibrillation, diabetes, peripheral vascular disease is a direct admit from podiatry office for nonhealing right foot ulcer. Patient has wound dehiscence and Dr. Elvina Mattes request direct admission for further evaluation and management. Patient was recently admitted underwent first right toe ray amputation in March 2019. She has a PICC line and supposed to get IV Zosyn, last dose 04/20/2017. Pt is now POD#2 s/p debridement of necrotic skin, fat, and muscle tissue from plantar R foot.  Clinical Impression  Pt admitted with above diagnosis. Pt currently with functional limitations due to the deficits listed below (see PT Problem List).  Pt moves slowly with cues for sequencing during bed mobility but no external assist. MinA+1 required to stabilize with transfers due to imbalance and weakness. Pt wears orthowedge shoe on R foot and post-op shoe on L foot during all mobility. Wound vac is in place. She able to take small, short steps from bed to recliner with rolling walker and minA+1 from therapist. Cues for proper sequencing with walker. Pt has one stumble and therapist has to assist to prevent her from falling backwards. SaO2>90% on room air with ambulation and HR WNL. Pt will need to return to SNF after discharge. Pt will benefit from PT services to address deficits in strength, balance, and mobility in order to return to full function at home.       Follow Up Recommendations SNF    Equipment Recommendations  None recommended by PT;Other (comment)(Needs to use her front-wheeled walker)    Recommendations for Other Services       Precautions / Restrictions Precautions Precautions: Fall Precaution Comments: Orthowedge shoe R, post-op or sneaker L Restrictions Weight Bearing  Restrictions: Yes      Mobility  Bed Mobility Overal bed mobility: Needs Assistance Bed Mobility: Supine to Sit     Supine to sit: Min guard     General bed mobility comments: Pt moves slowly with cues for sequencing but not external assist. HOB elevated and use of bed rails  Transfers Overall transfer level: Needs assistance Equipment used: Rolling walker (2 wheeled) Transfers: Sit to/from Stand Sit to Stand: Min assist         General transfer comment: Cues for sequencing. MinA+1 to stabilize during transfers. Instruction to maintain WB on R heel only in orthowedge shoe  Ambulation/Gait Ambulation/Gait assistance: Min assist Ambulation Distance (Feet): 3 Feet Assistive device: Rolling walker (2 wheeled)     Gait velocity interpretation: <1.8 ft/sec, indicative of risk for recurrent falls General Gait Details: Pt able to take small, short steps from bed to recliner with rolling walker. Cues for proper sequencing with walker. Pt has one stumble and therapist has to assist to prevent her from falling backwards. SaO2>90% on room air with ambulation and HR WNL.  Stairs            Wheelchair Mobility    Modified Rankin (Stroke Patients Only)       Balance Overall balance assessment: Needs assistance Sitting-balance support: No upper extremity supported Sitting balance-Leahy Scale: Good     Standing balance support: Bilateral upper extremity supported Standing balance-Leahy Scale: Fair Standing balance comment: Pt requires UE support in standing. When she lets go of her walker she falls over backwards and requires therapist support to stay upright  Pertinent Vitals/Pain Pain Assessment: 0-10 Pain Score: 8  Pain Location: Pt complaining of 8/10 L foot pain, denies R foot pain Pain Intervention(s): Limited activity within patient's tolerance;Monitored during session    Hazen expects to be  discharged to:: Skilled nursing facility Living Arrangements: Alone Available Help at Discharge: Personal care attendant;Other (Comment)(Person care attendant 2x/day, 5d/wk) Type of Home: House Home Access: Level entry     Home Layout: Multi-level;Able to live on main level with bedroom/bathroom Home Equipment: Walker - 4 wheels;Walker - 2 wheels;Cane - single point;Wheelchair - manual;Shower seat      Prior Function Level of Independence: Independent with assistive device(s)         Comments: Previously able to get out of the house nearly daily, generally active. Pt is normally independent with ADLs/IADLs. She has been at [facility] since her last hospital admission     Hand Dominance        Extremity/Trunk Assessment   Upper Extremity Assessment Upper Extremity Assessment: Generalized weakness    Lower Extremity Assessment Lower Extremity Assessment: Generalized weakness       Communication   Communication: No difficulties  Cognition Arousal/Alertness: Awake/alert Behavior During Therapy: WFL for tasks assessed/performed Overall Cognitive Status: Within Functional Limits for tasks assessed                                        General Comments      Exercises     Assessment/Plan    PT Assessment Patient needs continued PT services  PT Problem List Decreased strength;Decreased balance;Decreased mobility;Decreased safety awareness       PT Treatment Interventions DME instruction;Gait training;Functional mobility training;Therapeutic activities;Therapeutic exercise;Balance training;Neuromuscular re-education;Patient/family education    PT Goals (Current goals can be found in the Care Plan section)  Acute Rehab PT Goals Patient Stated Goal: Return to rehab PT Goal Formulation: With patient Time For Goal Achievement: 05/04/17 Potential to Achieve Goals: Good    Frequency Min 2X/week   Barriers to discharge Decreased caregiver support Pt lives  alone    Co-evaluation               AM-PAC PT "6 Clicks" Daily Activity  Outcome Measure Difficulty turning over in bed (including adjusting bedclothes, sheets and blankets)?: A Little Difficulty moving from lying on back to sitting on the side of the bed? : A Little Difficulty sitting down on and standing up from a chair with arms (e.g., wheelchair, bedside commode, etc,.)?: Unable Help needed moving to and from a bed to chair (including a wheelchair)?: A Little Help needed walking in hospital room?: A Little Help needed climbing 3-5 steps with a railing? : Total 6 Click Score: 14    End of Session Equipment Utilized During Treatment: Gait belt Activity Tolerance: Patient tolerated treatment well Patient left: in chair;with call bell/phone within reach;with chair alarm set Nurse Communication: Mobility status PT Visit Diagnosis: Unsteadiness on feet (R26.81);Muscle weakness (generalized) (M62.81);History of falling (Z91.81)    Time: 4818-5631 PT Time Calculation (min) (ACUTE ONLY): 22 min   Charges:   PT Evaluation $PT Eval Low Complexity: 1 Low PT Treatments $Therapeutic Activity: 8-22 mins   PT G Codes:        Lyndel Safe Janalyn Higby PT, DPT    Kjersten Ormiston 04/20/2017, 12:39 PM

## 2017-04-20 NOTE — Progress Notes (Signed)
Patient's son Jonnie Kind called [employer] representative ([employer]) back and chose [employer]. [facility] SNF authorization has been re-started. [facility] admissions coordinator at [facility] is aware of above and has been informed that patient needs a wound vac. Patient is aware of above.   [employer], LCSW [phone]

## 2017-04-20 NOTE — Progress Notes (Addendum)
Sharon [name] at [facility]                                                                                                                                                                                  Patient Demographics   Sharon [name], is a 84 y.o. female, DOB - 08/22/1933, IRJ:[id number]  Admit date - 04/16/2017   Admitting Physician Fritzi Mandes, MD  Outpatient Primary MD for the patient is Rusty Aus, MD   LOS - 4  Subjective: Patient seen and evaluated today   swelling and bruising along the groin and into the mons pubis and labia No Difficulty voiding Has some pain in the left great toe Status post debridement of right foot ulcer Currently on wound VAC Hemoglobin appears to be stable  Review of Systems:   CONSTITUTIONAL: No documented fever. No fatigue, weakness. No weight gain, no weight loss.  EYES: No blurry or double vision.  ENT: No tinnitus. No postnasal drip. No redness of the oropharynx.  RESPIRATORY: No cough, no wheeze, no hemoptysis. No dyspnea.  CARDIOVASCULAR: No chest pain. No orthopnea. No palpitations. No syncope.  GASTROINTESTINAL: No nausea, no vomiting or diarrhea. No abdominal pain. No melena or hematochezia.  GENITOURINARY: No dysuria or hematuria.  ENDOCRINE: No polyuria or nocturia. No heat or cold intolerance.  HEMATOLOGY: Has bruising along the groin extending into the mons pubis and labia INTEGUMENTARY: No rashes. No lesions.  MUSCULOSKELETAL: right foot pain better Has left great toe pain NEUROLOGIC: No numbness, tingling, or ataxia. No seizure-type activity.  PSYCHIATRIC: No anxiety. No insomnia. No ADD.    Vitals:   Vitals:   04/19/17 1643 04/19/17 2323 04/20/17 0739 04/20/17 1323  BP: (!) 165/56 (!) 160/55 (!) 176/60 (!) 156/65  Pulse: 60 60 (!) 55 66  Resp:  16    Temp: 98.4 F (36.9 C) 97.9 F (36.6 C) 97.8 F (36.6 C)   TempSrc: Oral Oral Oral   SpO2: 98% 98% 99%   Weight:      Height:         Wt Readings from Last 3 Encounters:  04/17/17 64 kg (141 lb)  04/15/17 64 kg (141 lb 1.6 oz)  03/20/17 64.8 kg (142 lb 13.7 oz)     Intake/Output Summary (Last 24 hours) at 04/20/2017 1358 Last data filed at 04/20/2017 0900 Gross per 24 hour  Intake 580 ml  Output 750 ml  Net -170 ml    Physical Exam:   GENERAL: Elderly female patient lying on the bed in no acute distress HEAD, EYES, EARS, NOSE AND THROAT: Atraumatic, normocephalic. Extraocular muscles are intact. Pupils equal and reactive to light. Sclerae anicteric.  No conjunctival injection. No oro-pharyngeal erythema.  NECK: Supple. There is no jugular venous distention. No bruits, no lymphadenopathy, no thyromegaly.  HEART: Regular rate and rhythm,. No murmurs, no rubs, no clicks.  LUNGS: Clear to auscultation bilaterally. No rales or rhonchi. No wheezes.  ABDOMEN: Soft, flat, nontender, nondistended. Has good bowel sounds. No hepatosplenomegaly appreciated. Bruising noted in the bilateral groin areas Genitourinary has asymmetric edema in the mons pubis and vulvar area with some bruising noted EXTREMITIES: No evidence of any cyanosis, clubbing, or peripheral edema.  +2 pedal pulses left foot. Right foot bandage with wound vac No evidence of any trauma to the left great toe No swelling of the left great toe NEUROLOGIC: The patient is alert, awake, and oriented x3 with no focal motor or sensory deficits appreciated bilaterally.  SKIN: Moist and warm with no rashes appreciated.  Psych: Not anxious, depressed LN: No inguinal LN enlargement    Antibiotics   Anti-infectives (From admission, onward)   Start     Dose/Rate Route Frequency Ordered Stop   04/17/17 0700  ceFAZolin (ANCEF) IVPB 1 g/50 mL premix    Note to Pharmacy:  Send with pt to OR   1 g 100 mL/hr over 30 Minutes Intravenous On call 04/16/17 1905 04/17/17 1235   04/16/17 1700  piperacillin-tazobactam (ZOSYN) IVPB 3.375 g     3.375 g 12.5 mL/hr over 240  Minutes Intravenous Every 8 hours 04/16/17 1650        Medications   Scheduled Meds: . amiodarone  200 mg Oral Daily  . amitriptyline  10 mg Oral QHS  . aspirin EC  81 mg Oral Daily  . bisacodyl  10 mg Oral QODAY  . cholecalciferol  1,000 Units Oral Daily  . ferrous sulfate  325 mg Oral BID  . furosemide  20 mg Oral Daily  . gabapentin  100 mg Oral QID  . gabapentin  600 mg Oral QID  . glimepiride  2 mg Oral Q breakfast  . GLUCERNA  237 mL Oral BID BM  . insulin aspart  0-9 Units Subcutaneous TID WC  . levothyroxine  112 mcg Oral QAC breakfast  . losartan  25 mg Oral BID  . metoprolol tartrate  25 mg Oral BID  . multivitamin-lutein  1 capsule Oral BID  . senna-docusate  1 tablet Oral BID  . simvastatin  20 mg Oral Daily  . sodium chloride flush  10-40 mL Intracatheter Q12H  . sodium chloride flush  3 mL Intravenous Q12H  . vitamin C  500 mg Oral BID   Continuous Infusions: . sodium chloride    . piperacillin-tazobactam (ZOSYN)  IV Stopped (04/20/17 0255)   PRN Meds:.sodium chloride, acetaminophen **OR** acetaminophen, acetaminophen, hydrALAZINE, hydrALAZINE, labetalol, morphine injection, ondansetron **OR** ondansetron (ZOFRAN) IV, ondansetron (ZOFRAN) IV, oxyCODONE, oxyCODONE, senna-docusate, sodium chloride flush, sodium chloride flush, sodium chloride flush, traMADol, zolpidem   Data Review:   Micro Results Recent Results (from the past 240 hour(s))  Surgical PCR screen     Status: None   Collection Time: 04/16/17  6:37 PM  Result Value Ref Range Status   MRSA, PCR NEGATIVE NEGATIVE Final   Staphylococcus aureus NEGATIVE NEGATIVE Final    Comment: (NOTE) The Xpert SA Assay (FDA approved for NASAL specimens in patients 34 years of age and older), is one component of a comprehensive surveillance program. It is not intended to diagnose infection nor to guide or monitor treatment. Performed at [facility], [address], [place], [place] [address]  Radiology Reports Ct Abdomen Pelvis Wo Contrast  Result Date: 04/17/2017 CLINICAL DATA:  Abdominal distension and decreased blood pressure following lower extremity angiography and bilateral common iliac artery stenting today. EXAM: CT ABDOMEN AND PELVIS WITHOUT CONTRAST TECHNIQUE: Multidetector CT imaging of the abdomen and pelvis was performed following the standard protocol without IV contrast. COMPARISON:  01/21/2017 FINDINGS: Lower chest: Mild motion artifact. Subsegmental atelectasis in the lung bases. No pleural effusion. Coronary artery atherosclerosis. Hepatobiliary: No focal liver abnormality is identified. There is no significant biliary dilatation status post cholecystectomy. Pancreas: 3.3 x 2.8 cm low-density/cystic mass in the pancreatic body is minimally larger than on the prior study. Smaller cystic lesions more distally in the pancreatic body and tail are similar to the prior study measuring up to 2.2 cm in size. No acute peripancreatic inflammatory changes. Spleen: Unremarkable. Adrenals/Urinary Tract: Unremarkable adrenal glands. Excreted contrast in the renal collecting systems and bladder. No hydronephrosis. Unchanged left lower pole renal scarring. Unchanged 1.3 cm left upper pole renal cyst and 8 mm left upper pole angiomyolipoma. Subcentimeter hypodense lesions in both kidneys are too small to fully characterize. Stomach/Bowel: The stomach is within normal limits. There is no evidence of bowel obstruction. A moderately large amount of stool is present in the rectum. Prior appendectomy. Vascular/Lymphatic: Abdominal aortic atherosclerosis without aneurysm. Bilateral common iliac artery stents extending into the distal abdominal aorta. No retroperitoneal hemorrhage about the abdominal aorta or iliac artery stents. Mildly prominent gastrohepatic and periportal lymph nodes measure up to 10 mm in short axis and are overall stable to minimally larger. Reproductive: Limited assessment due to  artifact from bilateral hip arthroplasties. There is likely an atrophic uterus. Ovaries are grossly unremarkable. Other: No intraperitoneal free fluid. Small to moderate volume hematoma in the right inguinal region extending to the inferior aspect of the iliac fossa, partially obscured by streak artifact. Subcutaneous stranding extends inferiorly and medially into the groin. Musculoskeletal: Old lower anterior rib fractures on the right. Bilateral hip arthroplasties. Old fractures of the inferior left pubic ramus and left ilium extending to the SI joint. Diffuse thoracolumbar disc degeneration. IMPRESSION: 1. Small to moderate volume hematoma in the right inguinal region mildly extending into the iliac fossa. 2. Stable to minimally increased size of multilocular cystic pancreatic body lesion with similar appearance of smaller cystic lesions in the pancreatic tail. Abdominal MRI is recommended on a nonemergent basis when the patient is able to follow breath hold instructions. 3. Stable to minimally increased size of upper abdominal lymph nodes. 4.  Aortic Atherosclerosis (ICD10-I70.0). Electronically Signed   By: Logan Bores M.D.   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 EXAM: PORTABLE CHEST 1 VIEW COMPARISON:  03/19/2017 and earlier. FINDINGS: Portable AP upright view at 0700 hours. Stable lung volumes. Stable cardiac size and mediastinal contours. Visualized tracheal air column is within normal limits. No pneumothorax. No pulmonary edema or pleural effusion. Mildly increased left greater than right upper lung interstitial opacity. No other confluent opacity. Calcified aortic atherosclerosis. IMPRESSION: 1. Mild asymmetric and increased upper lung interstitial opacity since 03/19/2017. Consider developing  bilateral respiratory infection. 2. No pleural effusion or other acute cardiopulmonary abnormality. Electronically Signed   By: Genevie Ann M.D.   On: 03/23/2017 07:56   [place] Ekg Site Rite  Result Date: 03/23/2017 If Site  Rite image not attached, placement could not be confirmed due to current cardiac rhythm.    CBC Recent Labs  Lab 04/16/17 1727 04/19/17 0339 04/20/17 0849  WBC 6.5 7.5 7.6  HGB 11.2* 9.3* 9.6*  HCT 35.2 29.0* 30.3*  PLT 231 226 227  MCV 81.5 81.5 81.8  MCH 26.0 26.1 25.8*  MCHC 31.9* 32.0 31.5*  RDW 23.8* 23.9* 22.9*    Chemistries  Recent Labs  Lab 04/16/17 1727 04/19/17 0339  NA 137 136  K 4.3 4.1  CL 105 106  CO2 26 23  GLUCOSE 140* 191*  BUN 31* 21*  CREATININE 1.24* 1.42*  CALCIUM 9.0 8.7*   ------------------------------------------------------------------------------------------------------------------ estimated creatinine clearance is 28 mL/min (A) (by C-G formula based on SCr of 1.42 mg/dL (H)). ------------------------------------------------------------------------------------------------------------------ No results for input(s): HGBA1C in the last 72 hours. ------------------------------------------------------------------------------------------------------------------ No results for input(s): CHOL, HDL, LDLCALC, TRIG, CHOLHDL, LDLDIRECT in the last 72 hours. ------------------------------------------------------------------------------------------------------------------ No results for input(s): TSH, T4TOTAL, T3FREE, THYROIDAB in the last 72 hours.  Invalid input(s): FREET3 ------------------------------------------------------------------------------------------------------------------ No results for input(s): VITAMINB12, FOLATE, FERRITIN, TIBC, IRON, RETICCTPCT in the last 72 hours.  Coagulation profile No results for input(s): INR, PROTIME in the last 168 hours.  No results for input(s): DDIMER in the last 72 hours.  Cardiac  Enzymes No results for input(s): CKMB, TROPONINI, MYOGLOBIN in the last 168 hours.  Invalid input(s): CK ------------------------------------------------------------------------------------------------------------------ Invalid input(s): POCBNP    Assessment & Plan   84 year old elderly female patient with history of atrial fibrillation, diabetes mellitus, peripheral vascular disease was admitted directly from podiatry office for nonhealing right foot ulcer.  Patient had a wound dehiscence.  1.  Nonhealing right foot ulcer Status post debridement and irrigation Continue wound VAC Wound care consultation daily dressings Status post angiogram and stent placements by vascular surgery for atherosclerotic occlusive disease bilateral lower extremities On IV Zosyn antibiotic  2.  Diabetes mellitus type 2 Continue sliding scale coverage with insulin Diabetic diet  3.  Chronic stable diastolic heart failure Compensated  4.  Paroxysmal atrial fibrillation On oral amiodarone and aspirin  5.  Vulvar and labial edema and hematoma  appreciate OB/GYN consultation Follow-up clinically  hemoglobin hematocrit stable No voiding difficulty as of present  6.  Status post physical therapy evaluation Skilled nursing facility placement requested Will discuss with patient's family regarding placement  7. Enterococcal fecalis UTI Sensitive to penicillin Abx  8.  Disposition Possible discharge in a.m. if clinically stable      Code Status Orders  (From admission, onward)        Start     Ordered   04/16/17 1706  Full code  Continuous     04/16/17 1705    Code Status History    Date Active Date Inactive Code Status Order ID Comments User Context   03/23/2017 1422 03/23/2017 2233 Full Code [id number]  Gladstone Lighter, MD Inpatient   03/20/2017 1121 03/23/2017 1422 DNR [id number]  Bettey Costa, MD Inpatient   03/20/2017 0140 03/20/2017 1121 Full Code [id number]  Lance Coon, MD ED    07/07/2015 0353 07/09/2015 1520 Full Code [id number]  Saundra Shelling, MD Inpatient   02/07/2015 1605 02/12/2015 1921 Full Code [id number]  Dustin Flock, MD Inpatient   11/05/2014 1551 11/09/2014 1536 Full Code [id number]  Gladstone Lighter, MD Inpatient   10/10/2014 1112 10/17/2014 1750 Full Code [id number]  Epifanio Lesches, MD Inpatient    Advance Directive Documentation     Most Recent Value  Type of Advance Directive  Healthcare  Power of Attorney  Pre-existing out of facility DNR order (yellow form or pink MOST form)  -  "MOST" Form in Place?  -      Time Spent in minutes   35  Greater than 50% of time spent in care coordination and counseling patient regarding the condition and plan of care.   Saundra Shelling M.D on 04/20/2017 at 1:58 PM  Between 7am to 6pm - Pager - [phone]  After 6pm go to [email or web address] - Proofreader  [employer]   Office  [phone]

## 2017-04-21 DIAGNOSIS — I7389 Other specified peripheral vascular diseases: Secondary | ICD-10-CM | POA: Diagnosis not present

## 2017-04-21 DIAGNOSIS — E1142 Type 2 diabetes mellitus with diabetic polyneuropathy: Secondary | ICD-10-CM | POA: Diagnosis not present

## 2017-04-21 DIAGNOSIS — L851 Acquired keratosis [keratoderma] palmaris et plantaris: Secondary | ICD-10-CM | POA: Diagnosis not present

## 2017-04-21 DIAGNOSIS — N183 Chronic kidney disease, stage 3 (moderate): Secondary | ICD-10-CM | POA: Diagnosis not present

## 2017-04-21 DIAGNOSIS — I7025 Atherosclerosis of native arteries of other extremities with ulceration: Secondary | ICD-10-CM | POA: Diagnosis not present

## 2017-04-21 DIAGNOSIS — E11628 Type 2 diabetes mellitus with other skin complications: Secondary | ICD-10-CM | POA: Diagnosis not present

## 2017-04-21 DIAGNOSIS — N9089 Other specified noninflammatory disorders of vulva and perineum: Secondary | ICD-10-CM | POA: Diagnosis not present

## 2017-04-21 DIAGNOSIS — I872 Venous insufficiency (chronic) (peripheral): Secondary | ICD-10-CM | POA: Diagnosis not present

## 2017-04-21 DIAGNOSIS — M869 Osteomyelitis, unspecified: Secondary | ICD-10-CM | POA: Diagnosis not present

## 2017-04-21 DIAGNOSIS — B351 Tinea unguium: Secondary | ICD-10-CM | POA: Diagnosis not present

## 2017-04-21 DIAGNOSIS — E1165 Type 2 diabetes mellitus with hyperglycemia: Secondary | ICD-10-CM | POA: Diagnosis not present

## 2017-04-21 DIAGNOSIS — I10 Essential (primary) hypertension: Secondary | ICD-10-CM | POA: Diagnosis not present

## 2017-04-21 DIAGNOSIS — Z9889 Other specified postprocedural states: Secondary | ICD-10-CM | POA: Diagnosis not present

## 2017-04-21 DIAGNOSIS — E11621 Type 2 diabetes mellitus with foot ulcer: Secondary | ICD-10-CM | POA: Diagnosis not present

## 2017-04-21 DIAGNOSIS — L97511 Non-pressure chronic ulcer of other part of right foot limited to breakdown of skin: Secondary | ICD-10-CM | POA: Diagnosis not present

## 2017-04-21 DIAGNOSIS — I110 Hypertensive heart disease with heart failure: Secondary | ICD-10-CM | POA: Diagnosis not present

## 2017-04-21 DIAGNOSIS — I509 Heart failure, unspecified: Secondary | ICD-10-CM | POA: Diagnosis not present

## 2017-04-21 DIAGNOSIS — L97519 Non-pressure chronic ulcer of other part of right foot with unspecified severity: Secondary | ICD-10-CM | POA: Diagnosis not present

## 2017-04-21 DIAGNOSIS — E1151 Type 2 diabetes mellitus with diabetic peripheral angiopathy without gangrene: Secondary | ICD-10-CM | POA: Diagnosis not present

## 2017-04-21 DIAGNOSIS — I4891 Unspecified atrial fibrillation: Secondary | ICD-10-CM | POA: Diagnosis not present

## 2017-04-21 DIAGNOSIS — G4700 Insomnia, unspecified: Secondary | ICD-10-CM | POA: Diagnosis not present

## 2017-04-21 DIAGNOSIS — I83023 Varicose veins of left lower extremity with ulcer of ankle: Secondary | ICD-10-CM | POA: Diagnosis not present

## 2017-04-21 DIAGNOSIS — B9562 Methicillin resistant Staphylococcus aureus infection as the cause of diseases classified elsewhere: Secondary | ICD-10-CM | POA: Diagnosis not present

## 2017-04-21 DIAGNOSIS — E11649 Type 2 diabetes mellitus with hypoglycemia without coma: Secondary | ICD-10-CM | POA: Diagnosis not present

## 2017-04-21 DIAGNOSIS — M86371 Chronic multifocal osteomyelitis, right ankle and foot: Secondary | ICD-10-CM | POA: Diagnosis not present

## 2017-04-21 DIAGNOSIS — Z95820 Peripheral vascular angioplasty status with implants and grafts: Secondary | ICD-10-CM | POA: Diagnosis not present

## 2017-04-21 DIAGNOSIS — M7981 Nontraumatic hematoma of soft tissue: Secondary | ICD-10-CM | POA: Diagnosis not present

## 2017-04-21 DIAGNOSIS — F339 Major depressive disorder, recurrent, unspecified: Secondary | ICD-10-CM | POA: Diagnosis not present

## 2017-04-21 DIAGNOSIS — G603 Idiopathic progressive neuropathy: Secondary | ICD-10-CM | POA: Diagnosis not present

## 2017-04-21 DIAGNOSIS — L97509 Non-pressure chronic ulcer of other part of unspecified foot with unspecified severity: Secondary | ICD-10-CM | POA: Diagnosis not present

## 2017-04-21 DIAGNOSIS — M86071 Acute hematogenous osteomyelitis, right ankle and foot: Secondary | ICD-10-CM | POA: Diagnosis not present

## 2017-04-21 DIAGNOSIS — R2689 Other abnormalities of gait and mobility: Secondary | ICD-10-CM | POA: Diagnosis not present

## 2017-04-21 DIAGNOSIS — M19072 Primary osteoarthritis, left ankle and foot: Secondary | ICD-10-CM | POA: Diagnosis not present

## 2017-04-21 DIAGNOSIS — L97513 Non-pressure chronic ulcer of other part of right foot with necrosis of muscle: Secondary | ICD-10-CM | POA: Diagnosis not present

## 2017-04-21 DIAGNOSIS — Z7401 Bed confinement status: Secondary | ICD-10-CM | POA: Diagnosis not present

## 2017-04-21 DIAGNOSIS — Z89411 Acquired absence of right great toe: Secondary | ICD-10-CM | POA: Diagnosis not present

## 2017-04-21 DIAGNOSIS — M6281 Muscle weakness (generalized): Secondary | ICD-10-CM | POA: Diagnosis not present

## 2017-04-21 DIAGNOSIS — M79672 Pain in left foot: Secondary | ICD-10-CM | POA: Diagnosis not present

## 2017-04-21 DIAGNOSIS — I5032 Chronic diastolic (congestive) heart failure: Secondary | ICD-10-CM | POA: Diagnosis not present

## 2017-04-21 DIAGNOSIS — E1169 Type 2 diabetes mellitus with other specified complication: Secondary | ICD-10-CM | POA: Diagnosis not present

## 2017-04-21 DIAGNOSIS — L97329 Non-pressure chronic ulcer of left ankle with unspecified severity: Secondary | ICD-10-CM | POA: Diagnosis not present

## 2017-04-21 DIAGNOSIS — E119 Type 2 diabetes mellitus without complications: Secondary | ICD-10-CM | POA: Diagnosis not present

## 2017-04-21 DIAGNOSIS — I739 Peripheral vascular disease, unspecified: Secondary | ICD-10-CM | POA: Diagnosis not present

## 2017-04-21 DIAGNOSIS — I480 Paroxysmal atrial fibrillation: Secondary | ICD-10-CM | POA: Diagnosis not present

## 2017-04-21 LAB — GLUCOSE, CAPILLARY
GLUCOSE-CAPILLARY: 115 mg/dL — AB (ref 65–99)
GLUCOSE-CAPILLARY: 148 mg/dL — AB (ref 65–99)
GLUCOSE-CAPILLARY: 162 mg/dL — AB (ref 65–99)

## 2017-04-21 MED ORDER — TRAMADOL HCL 50 MG PO TABS: 50.0000 mg | ORAL_TABLET | Freq: Four times a day (QID) | ORAL | 0 refills | Status: DC | PRN

## 2017-04-21 MED ORDER — OXYCODONE HCL 5 MG PO TABS: 5.0000 mg | ORAL_TABLET | ORAL | 0 refills | Status: AC | PRN

## 2017-04-21 MED ORDER — METOPROLOL TARTRATE 25 MG PO TABS: 25.0000 mg | ORAL_TABLET | Freq: Two times a day (BID) | ORAL | 0 refills | Status: DC

## 2017-04-21 MED ORDER — AMPICILLIN 500 MG PO CAPS: 500.0000 mg | ORAL_CAPSULE | Freq: Four times a day (QID) | ORAL | Status: AC

## 2017-04-21 NOTE — Discharge Summary (Signed)
[facility] [place], 84 y.o., DOB 1933/04/23, MRN [id number]. Admission date: 04/16/2017 Discharge Date 04/21/2017 Primary MD Rusty Aus, MD Admitting Physician Fritzi Mandes, MD  Admission Diagnosis   1.  Nonhealing right foot ulcer 2.  Proximal atrial fibrillation 3.  Diabetes mellitus type 2 4.  Hypertension 5.  Chronic diastolic heart failure  Discharge Diagnosis       1.  Right foot ulcer status post debridement 2.  Bilateral occlusive atherosclerotic disease of lower extremities with right foot wound status post debridement 3.  Status post angioplasty and stent placement in the right and left common iliac artery 4.  Diabetes mellitus type 2 5.  Enterococcal faecalis urinary tract infection 6.  Hypertension 7.  Chronic diastolic heart failure 8.  Groin and vulvar hematoma   Hospital Course  84 year old female patient with history of atrial fibrillation, diabetes mellitus type type II, hypertension, chronic diastolic heart failure, peripheral vascular disease was admitted directly from podiatry office for nonhealing right foot ulcer.  The patient was seeing Dr. Albertine Patricia in the podiatry office.  Patient had a right toe amputation in March 2019.  Patient was getting IV Zosyn antibiotic via PICC line and last dose was 04/20/2017.  Secondary to nonhealing right foot ulcer patient was admitted to hospitalist service.  She was evaluated by vascular surgery for bilateral occlusive atherosclerotic disease of lower extremities.  She underwent angioplasty and bilateral right and left common iliac artery stent placement by vascular surgery.  Patient tolerated procedure well.  During the stay in the hospital patient received IV Zosyn antibiotic.  And was also followed by podiatry surgeon Dr. Myna Bright who did debridement of necrotic skin and muscle tissue from the plantar right foot.  Patient has a wound VAC which needs to be changed every  Monday Wednesday and Friday.  Podiatric surgeon recommended nonweightbearing on the right lower extremity.  Status post angioplasty and stent placements patient also developed groin hematoma bilaterally trending down to the mons pubis and vulvar area.  OB/GYN consultation was also done who recommended to monitor the hematoma.  No difficulty in voiding. blood thinner medications were all stopped.  Patient was on aspirin which was also stopped.  Hemoglobin is stable at the time of discharge.  Patient tolerated above procedures well.  She is hemodynamically stable with be discharged to [facility] care facility.  Disposition plan was discussed with patient and family.  They are in agreement with the plan of disposition. No weightbearing in the right lower extremity Wound VAC to be changed on Monday Wednesday and Friday Resume blood thinner medications that is aspirin after 2-3 weeks after assessment of hematoma again clinically Consults   Podiatric surgery consult with Dr. Albertine Patricia OB/GYN consult with Dr. Teresa Coombs Vascular surgery consult with Dr. Hortencia Pilar  Significant Tests:  See full reports for all details    Ct Abdomen Pelvis Wo Contrast  Result Date: 04/17/2017 CLINICAL DATA:  Abdominal distension and decreased blood pressure following lower extremity angiography and bilateral common iliac artery stenting today. EXAM: CT ABDOMEN AND PELVIS WITHOUT CONTRAST TECHNIQUE: Multidetector CT imaging of the abdomen and pelvis was performed following the standard protocol without IV contrast. COMPARISON:  01/21/2017 FINDINGS: Lower chest: Mild motion artifact. Subsegmental atelectasis in the lung bases. No pleural effusion. Coronary artery atherosclerosis. Hepatobiliary: No focal liver abnormality is identified. There is no significant biliary dilatation status post cholecystectomy. Pancreas: 3.3 x 2.8 cm low-density/cystic mass in  the pancreatic body is minimally larger than on the prior  study. Smaller cystic lesions more distally in the pancreatic body and tail are similar to the prior study measuring up to 2.2 cm in size. No acute peripancreatic inflammatory changes. Spleen: Unremarkable. Adrenals/Urinary Tract: Unremarkable adrenal glands. Excreted contrast in the renal collecting systems and bladder. No hydronephrosis. Unchanged left lower pole renal scarring. Unchanged 1.3 cm left upper pole renal cyst and 8 mm left upper pole angiomyolipoma. Subcentimeter hypodense lesions in both kidneys are too small to fully characterize. Stomach/Bowel: The stomach is within normal limits. There is no evidence of bowel obstruction. A moderately large amount of stool is present in the rectum. Prior appendectomy. Vascular/Lymphatic: Abdominal aortic atherosclerosis without aneurysm. Bilateral common iliac artery stents extending into the distal abdominal aorta. No retroperitoneal hemorrhage about the abdominal aorta or iliac artery stents. Mildly prominent gastrohepatic and periportal lymph nodes measure up to 10 mm in short axis and are overall stable to minimally larger. Reproductive: Limited assessment due to artifact from bilateral hip arthroplasties. There is likely an atrophic uterus. Ovaries are grossly unremarkable. Other: No intraperitoneal free fluid. Small to moderate volume hematoma in the right inguinal region extending to the inferior aspect of the iliac fossa, partially obscured by streak artifact. Subcutaneous stranding extends inferiorly and medially into the groin. Musculoskeletal: Old lower anterior rib fractures on the right. Bilateral hip arthroplasties. Old fractures of the inferior left pubic ramus and left ilium extending to the SI joint. Diffuse thoracolumbar disc degeneration. IMPRESSION: 1. Small to moderate volume hematoma in the right inguinal region mildly extending into the iliac fossa. 2. Stable to minimally increased size of multilocular cystic pancreatic body lesion with  similar appearance of smaller cystic lesions in the pancreatic tail. Abdominal MRI is recommended on a nonemergent basis when the patient is able to follow breath hold instructions. 3. Stable to minimally increased size of upper abdominal lymph nodes. 4.  Aortic Atherosclerosis (ICD10-I70.0). Electronically Signed   By: Logan Bores M.D.   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 EXAM: PORTABLE CHEST 1 VIEW COMPARISON:  03/19/2017 and earlier. FINDINGS: Portable AP upright view at 0700 hours. Stable lung volumes. Stable cardiac size and mediastinal contours. Visualized tracheal air column is within normal limits. No pneumothorax. No pulmonary edema or pleural effusion. Mildly increased left greater than right upper lung interstitial opacity. No other confluent opacity. Calcified aortic atherosclerosis. IMPRESSION: 1. Mild asymmetric and increased upper lung interstitial opacity since 03/19/2017. Consider developing bilateral respiratory infection. 2. No pleural effusion or other acute cardiopulmonary abnormality. Electronically Signed   By: Genevie Ann M.D.   On: 03/23/2017 07:56   [place] Ekg Site Rite  Result Date: 03/23/2017 If Site Rite image not attached, placement could not be confirmed due to current cardiac rhythm.      Today   Subjective:   Veronia Patil 84 year old elderly female patient lying on the bed in no acute distress No fever and chills No nausea vomiting No shortness of breath No chest pain  Objective:   Blood pressure (!) 118/43, pulse (!) 43, temperature 97.7 F (36.5 C), temperature source Oral, resp. rate 16, height 5\' 5"  (1.651 m), weight 64 kg (141 lb), SpO2 93 %.  .  Intake/Output Summary (Last 24 hours) at 04/21/2017 1017 Last data  filed at 04/21/2017 0900 Gross per  24 hour  Intake 360 ml  Output -  Net 360 ml    Exam VITAL SIGNS: Blood pressure (!) 118/43, pulse (!) 43, temperature 97.7 F (36.5 C), temperature source Oral, resp. rate 16, height 5\' 5"  (1.651 m), weight 64 kg (141 lb), SpO2 93 %.  GENERAL:  84 y.o.-year-old patient lying in the bed with no acute distress.  EYES: Pupils equal, round, reactive to light and accommodation. No scleral icterus. Extraocular muscles intact.  HEENT: Head atraumatic, normocephalic. Oropharynx and nasopharynx clear.  NECK:  Supple, no jugular venous distention. No thyroid enlargement, no tenderness.  LUNGS: Normal breath sounds bilaterally, no wheezing, rales,rhonchi or crepitation. No use of accessory muscles of respiration.  Small skin tear over the upper chest area CARDIOVASCULAR: S1, S2 normal. No murmurs, rubs, or gallops.  ABDOMEN: Soft, nontender, nondistended. Bowel sounds present. No organomegaly or mass.  Bilateral groin hematoma noted which is resolving Mons pubis and vulvar area swelling secondary to hematoma EXTREMITIES: Right foot bandaged with wound VAC NEUROLOGIC: Cranial nerves II through XII are intact. Muscle strength 5/5 in all extremities. Sensation intact. Gait not checked.  PSYCHIATRIC: The patient is alert and oriented x 3.  SKIN: Skin tear over the chest wall  Data Review     CBC w Diff:  Lab Results  Component Value Date   WBC 7.6 04/20/2017   HGB 9.6 (L) 04/20/2017   HGB 10.1 (L) 12/28/2013   HCT 30.3 (L) 04/20/2017   HCT 31.6 (L) 12/28/2013   PLT 227 04/20/2017   PLT 234 12/28/2013   LYMPHOPCT 15 04/07/2017   LYMPHOPCT 28.0 12/28/2013   BANDSPCT 3 03/25/2017   MONOPCT 7 04/07/2017   MONOPCT 10.2 12/28/2013   EOSPCT 7 04/07/2017   EOSPCT 5.0 12/28/2013   BASOPCT 2 04/07/2017   BASOPCT 1.4 12/28/2013   CMP:  Lab Results  Component Value Date   NA 136 04/19/2017   NA 136 (A) 10/19/2014   NA 132 (L) 04/13/2014   K 4.1 04/19/2017   K 3.8 04/13/2014   CL 106  04/19/2017   CL 100 (L) 04/13/2014   CO2 23 04/19/2017   CO2 24 04/13/2014   BUN 21 (H) 04/19/2017   BUN 17 10/21/2014   BUN 15 04/13/2014   CREATININE 1.42 (H) 04/19/2017   CREATININE 1.04 (H) 04/13/2014   GLU 59 10/19/2014   PROT 6.6 04/07/2017   PROT 7.4 12/25/2013   ALBUMIN 3.0 (L) 04/07/2017   ALBUMIN 3.3 (L) 12/25/2013   BILITOT 0.4 04/07/2017   BILITOT 0.4 12/25/2013   ALKPHOS 135 (H) 04/07/2017   ALKPHOS 129 (H) 12/25/2013   AST 37 04/07/2017   AST 45 (H) 12/25/2013   ALT 24 04/07/2017   ALT 28 12/25/2013  .  Micro Results Recent Results (from the past 240 hour(s))  Surgical PCR screen     Status: None   Collection Time: 04/16/17  6:37 PM  Result Value Ref Range Status   MRSA, PCR NEGATIVE NEGATIVE Final   Staphylococcus aureus NEGATIVE NEGATIVE Final    Comment: (NOTE) The Xpert SA Assay (FDA approved for NASAL specimens in patients 75 years of age and older), is one component of a comprehensive surveillance program. It is not intended to diagnose infection nor to guide or monitor treatment. Performed at [facility], [address], [place], [place] [address]         Code Status Orders  (From admission, onward)  Start     Ordered   04/16/17 1706  Full code  Continuous     04/16/17 1705    Code Status History    Date Active Date Inactive Code Status Order ID Comments User Context   03/23/2017 1422 03/23/2017 2233 Full Code [id number]  Gladstone Lighter, MD Inpatient   03/20/2017 1121 03/23/2017 1422 DNR [id number]  Bettey Costa, MD Inpatient   03/20/2017 0140 03/20/2017 1121 Full Code [id number]  Lance Coon, MD ED   07/07/2015 0353 07/09/2015 1520 Full Code [id number]  Saundra Shelling, MD Inpatient   02/07/2015 1605 02/12/2015 1921 Full Code [id number]  Dustin Flock, MD Inpatient   11/05/2014 1551 11/09/2014 1536 Full Code [id number]  Gladstone Lighter, MD Inpatient   10/10/2014 1112 10/17/2014 1750 Full Code [id number]  Epifanio Lesches, MD  Inpatient    Advance Directive Documentation     Most Recent Value  Type of Advance Directive  Healthcare Power of Attorney  Pre-existing out of facility DNR order (yellow form or pink MOST form)  -  "MOST" Form in Place?  -           Contact information for follow-up providers    Schnier, Dolores Lory, MD Follow up in 2 week(s).   Specialties:  Vascular Surgery, Cardiology, Radiology, Vascular Surgery Why:  First post procedure follow-up.  Patient will need ABI. Contact information: [place] [address] [phone]        Rusty Aus, MD Follow up in 1 week(s).   Specialty:  Internal Medicine Contact information: [place] [address] [phone]        Albertine Patricia, DPM Follow up in 2 week(s).   Specialty:  Podiatry Contact information: [place]  [id number] [phone]            Contact information for after-discharge care    [facility] .   Service:  Skilled Nursing Contact information: [place] [phone]                  Discharge Medications   Allergies as of 04/21/2017      Reactions   Carbamazepine Other (See Comments)   Reaction: unknown   Cymbalta [duloxetine Hcl] Other (See Comments)   Confusion, disorientation   Duloxetine    Other reaction(s): Other (See Comments) Confusion, disorientation   Lyrica [pregabalin] Other (See Comments)   Patient and son states this makes the patient confused.      Medication List    STOP taking these medications   aspirin 81 MG tablet   piperacillin-tazobactam IVPB Commonly known as:  ZOSYN     TAKE these medications   acetaminophen 500 MG tablet Commonly known as:  TYLENOL Take 500 mg by mouth every 4 (four) hours as needed for mild pain or moderate pain.   amiodarone 200 MG  tablet Commonly known as:  PACERONE Take 1 tablet (200 mg total) by mouth daily.   amitriptyline 10 MG tablet Commonly known as:  ELAVIL Take 10 mg by mouth at bedtime.   ampicillin 500 MG capsule Commonly known as:  PRINCIPEN Take 1 capsule (500 mg total) by mouth 4 (four) times daily for 10 days.   bisacodyl 5 MG EC tablet Commonly known as:  DULCOLAX Take 2 tablets (10 mg total) by mouth every other day.   cholecalciferol 1000 units tablet Commonly known as:  VITAMIN D Take 1,000 Units by mouth daily.   ENDIT EX Apply 1 application topically 2 (two) times daily.   feeding supplement (PRO-STAT SUGAR FREE 64) Liqd Take 30 mLs by mouth 2 (two) times daily between meals.   ferrous sulfate 325 (65 FE) MG tablet Take 325 mg by mouth 2 (two) times daily.   furosemide 20 MG tablet Commonly known as:  LASIX Take 1 tablet (20 mg total) by mouth daily.   gabapentin 100 MG capsule Commonly known as:  NEURONTIN Take 100 mg by mouth 4 (four) times daily. What changed:  Another medication with the same name was removed. Continue taking this medication, and follow the directions you see here.   glimepiride 2 MG tablet Commonly known as:  AMARYL Take 2 mg by mouth daily with breakfast.   GLUCERNA Liqd Take 237 mLs by mouth 2 (two) times daily between meals.   levothyroxine 112 MCG tablet Commonly known as:  SYNTHROID, LEVOTHROID Take 112 mcg by mouth daily before breakfast. 30 minutes before breakfast   losartan 25 MG tablet Commonly known as:  COZAAR Take 25 mg by mouth 2 (two) times daily.   metoprolol tartrate 25 MG tablet Commonly known as:  LOPRESSOR Take 1 tablet (25 mg total) by mouth 2 (two) times daily.   nystatin cream Commonly known as:  MYCOSTATIN Apply 1 application topically 2 (two) times daily.   oxyCODONE 5 MG immediate release tablet Commonly known as:  Oxy IR/ROXICODONE Take 1 tablet (5 mg total) by mouth every 4 (four) hours as needed for up to 7  days for moderate pain or severe pain.   potassium chloride 10 MEQ CR capsule Commonly known as:  MICRO-K Take 10 mEq by mouth daily.   PRESERVISION AREDS 2+MULTI VIT PO Take 1 capsule by mouth 2 (two) times daily.   senna-docusate 8.6-50 MG tablet Commonly known as:  Senokot-S Take 1 tablet by mouth 2 (two) times daily.   simvastatin 20 MG tablet Commonly known as:  ZOCOR Take 20 mg by mouth daily.   traMADol 50 MG tablet Commonly known as:  ULTRAM Take 1 tablet (50 mg total) by mouth every 6 (six) hours as needed.   vitamin C 500 MG tablet Commonly known as:  ASCORBIC ACID Take 500 mg by mouth 2 (two) times daily.          Total Time in preparing paper work, data evaluation and todays exam - 35 minutes  Saundra Shelling M.D on 04/21/2017 at 10:17 AM [place]  [phone]

## 2017-04-21 NOTE — Progress Notes (Signed)
Report called and given to [facility] at [facility]. EMS called for transport. PICC line removed by Velna Hatchet, RN. Wound vac disconnected and replaced with a wet to dry dressing.

## 2017-04-21 NOTE — Clinical Social Work Placement (Signed)
   CLINICAL SOCIAL WORK PLACEMENT  NOTE  Date:  04/21/2017  Patient Details  Name: Sharon [name] MRN: [id number] Date of Birth: September 16, 1933  Clinical Social Work is seeking post-discharge placement for this patient at the [place] level of care (*CSW will initial, date and re-position this form in  chart as items are completed):  Yes   Patient/family provided with [facility] Work Department's list of facilities offering this level of care within the geographic area requested by the patient (or if unable, by the patient's family).  Yes   Patient/family informed of their freedom to choose among providers that offer the needed level of care, that participate in Medicare, Medicaid or managed care program needed by the patient, have an available bed and are willing to accept the patient.  Yes   Patient/family informed of [facility]'s ownership interest in [facility] and [facility], as well as of the fact that they are under no obligation to receive care at these facilities.  PASRR submitted to EDS on       PASRR number received on       Existing PASRR number confirmed on 04/16/17     FL2 transmitted to all facilities in geographic area requested by pt/family on 04/20/17     FL2 transmitted to all facilities within larger geographic area on       Patient informed that his/her managed care company has contracts with or will negotiate with certain facilities, including the following:        Yes   Patient/family informed of bed offers received.  Patient chooses bed at [facility])     Physician recommends and patient chooses bed at      Patient to be transferred to [facility] ) on 04/21/17.  Patient to be transferred to facility by [facility] )     Patient family notified on 04/21/17 of transfer.  Name of family member notified:  (Patient's son [name] is aware of D/C today. )     PHYSICIAN        Additional Comment:    _______________________________________________ Carmelo Reidel, Veronia Beets, LCSW 04/21/2017, 4:34 PM

## 2017-04-21 NOTE — Progress Notes (Signed)
Patient is medically stable for D/C to [facility] today. [facility] authorization has been received, authorization # [id number]. Patient and her son [name] are aware patient will have a $160 co-pay per day because she has used 24 days. Per [facility] admissions coordinator at [facility] patient can come today and she had a wound vac for patient. RN will call report and arrange EMS for transport. Clinical Education officer, museum (CSW) sent D/C orders to [facility] via [place]. Patient is aware of above. CSW contacted patient's son [name] and made him aware of above. Please reconsult if future social work needs arise. CSW signing off.   [employer], LCSW [phone]

## 2017-04-21 NOTE — Progress Notes (Signed)
Per [facility] case manager medical director is reviewing the case.   [employer], LCSW [phone]

## 2017-05-05 DIAGNOSIS — E1169 Type 2 diabetes mellitus with other specified complication: Secondary | ICD-10-CM | POA: Diagnosis not present

## 2017-05-05 DIAGNOSIS — M86371 Chronic multifocal osteomyelitis, right ankle and foot: Secondary | ICD-10-CM | POA: Diagnosis not present

## 2017-05-05 DIAGNOSIS — L97509 Non-pressure chronic ulcer of other part of unspecified foot with unspecified severity: Secondary | ICD-10-CM | POA: Diagnosis not present

## 2017-05-05 DIAGNOSIS — B351 Tinea unguium: Secondary | ICD-10-CM | POA: Diagnosis not present

## 2017-05-05 DIAGNOSIS — M869 Osteomyelitis, unspecified: Secondary | ICD-10-CM | POA: Diagnosis not present

## 2017-05-05 DIAGNOSIS — L851 Acquired keratosis [keratoderma] palmaris et plantaris: Secondary | ICD-10-CM | POA: Diagnosis not present

## 2017-05-05 DIAGNOSIS — E11621 Type 2 diabetes mellitus with foot ulcer: Secondary | ICD-10-CM | POA: Diagnosis not present

## 2017-05-05 DIAGNOSIS — E1142 Type 2 diabetes mellitus with diabetic polyneuropathy: Secondary | ICD-10-CM | POA: Diagnosis not present

## 2017-05-06 ENCOUNTER — Other Ambulatory Visit: Payer: Self-pay | Admitting: *Deleted

## 2017-05-06 NOTE — Patient Outreach (Signed)
[facility]) Care Management  05/06/2017  Sharon [name] 08-04-1933 [id number]   Patient is a 84 year old female currently in rehab following a hopsital stay due to a nonhealing foot ulcer. [facility] care management services discussed, information packet provided. Patient verbally accepted [facility] care management services. Per patient, she has a positive support system. She has twin sons who check her on nightly. They live in [place] and [place] and come to see her every weekend. Her Doristine Bosworth visited today. Per patient, her foot is  healing , dressing changes daily, MD came to see her yesterday. Patient is non-weight bearing at this time.Patient actively participates in  PT and OT twice a daily and is anxiously awaiting discharge home. Discharge date not determined at this time, however patient will return to her own home.   Plan: This Education officer, museum will follow up with patient within 2 weeks regarding her progress in rehab.   Sheralyn Boatman [facility] Care Management [phone]

## 2017-05-07 ENCOUNTER — Ambulatory Visit (INDEPENDENT_AMBULATORY_CARE_PROVIDER_SITE_OTHER): Payer: PPO | Admitting: Vascular Surgery

## 2017-05-07 ENCOUNTER — Other Ambulatory Visit: Payer: Self-pay | Admitting: *Deleted

## 2017-05-07 ENCOUNTER — Ambulatory Visit (INDEPENDENT_AMBULATORY_CARE_PROVIDER_SITE_OTHER): Payer: PPO

## 2017-05-07 ENCOUNTER — Encounter (INDEPENDENT_AMBULATORY_CARE_PROVIDER_SITE_OTHER): Payer: Self-pay | Admitting: Vascular Surgery

## 2017-05-07 VITALS — BP 186/59 | HR 43 | Resp 16 | Ht 65.0 in | Wt 140.0 lb

## 2017-05-07 DIAGNOSIS — E11649 Type 2 diabetes mellitus with hypoglycemia without coma: Secondary | ICD-10-CM | POA: Diagnosis not present

## 2017-05-07 DIAGNOSIS — I7025 Atherosclerosis of native arteries of other extremities with ulceration: Secondary | ICD-10-CM | POA: Diagnosis not present

## 2017-05-07 DIAGNOSIS — L97509 Non-pressure chronic ulcer of other part of unspecified foot with unspecified severity: Secondary | ICD-10-CM | POA: Diagnosis not present

## 2017-05-07 DIAGNOSIS — I10 Essential (primary) hypertension: Secondary | ICD-10-CM | POA: Diagnosis not present

## 2017-05-07 DIAGNOSIS — I739 Peripheral vascular disease, unspecified: Secondary | ICD-10-CM

## 2017-05-07 NOTE — Telephone Encounter (Signed)
This encounter was created in error - please disregard.

## 2017-05-07 NOTE — Addendum Note (Signed)
Addended by: Vern Claude on: 05/07/2017 12:45 PM   Modules accepted: Level of Service, SmartSet

## 2017-05-07 NOTE — Progress Notes (Signed)
MRN : [id number]  Sharon [name] is a 84 y.o. (Mar 28, 1933) female who presents with chief complaint of No chief complaint on file. Marland Kitchen  History of Present Illness:   The patient returns to the office for followup and review status post angiogram with intervention.   Procedure(s) Performed 04/17/2017:             1.  Abdominal aortogram             2.  Right lower extremity distal runoff             3.  Percutaneous transluminal angioplasty and stent placement right common iliac artery; "kissing balloon" technique             4.  Percutaneous transluminal and plasty and stent placement left common iliac artery; "kissing balloon" technique             5.  Ultrasound guided access bilateral common femoral arteries             6.  StarClose closure device bilateral common femoral arteries  Subsequently she had debridement of necrotic skin fat and muscle tissue from plantar right foot by Dr Elvina Mattes on 04/18/2017.  The patient notes improvement in the lower extremity symptoms. No interval shortening of the patient's claudication distance or rest pain symptoms. Previous wounds have now healed.  No new ulcers or wounds have occurred since the last visit.  There have been no significant changes to the patient's overall health care.  The patient denies amaurosis fugax or recent TIA symptoms. There are no recent neurological changes noted. The patient denies history of DVT, PE or superficial thrombophlebitis. The patient denies recent episodes of angina or shortness of breath.   ABI's Rt=0.89 and Lt=0.94      No outpatient medications have been marked as taking for the 05/07/17 encounter (Appointment) with Delana Meyer, Dolores Lory, MD.    Past Medical History:  Diagnosis Date  . Anemia   . Atrial fibrillation ([place])   . Cardiac arrest ([place])   . Cataract   . CHF (congestive heart failure) ([place])   . Diabetes mellitus without complication ([place])   . Edema    feet/ankles occas  . Hip fracture  ([place])   . Hyperlipidemia   . Hypertension   . Hypothyroid   . Neuropathy   . Osteomyelitis ([place])    left first metatarsal  . Stroke [facility])     Past Surgical History:  Procedure Laterality Date  . ACHILLES TENDON SURGERY Left 02/08/2015   Procedure: ACHILLES LENGTHENING/KIDNER;  Surgeon: Albertine Patricia, DPM;  Location: [facility] ORS;  Service: Podiatry;  Laterality: Left;  . APPENDECTOMY    . CARDIAC CATHETERIZATION  08/25/13  . CATARACT EXTRACTION W/PHACO Left 07/20/2014   Procedure: CATARACT EXTRACTION PHACO AND INTRAOCULAR LENS PLACEMENT (IOC);  Surgeon: Leandrew Koyanagi, MD;  Location: [facility] ORS;  Service: Ophthalmology;  Laterality: Left;  [place]  1:18                 AP     23.6             CDE   9.69      lot #[id number]  . CHOLECYSTECTOMY    . CORONARY ANGIOPLASTY    . HALLUX VALGUS AKIN Left 02/08/2015   Procedure: HALLUX VALGUS AKIN/ KELLER;  Surgeon: Albertine Patricia, DPM;  Location: [facility] ORS;  Service: Podiatry;  Laterality: Left;  IVA with Local needs 1 hour for this case   .  HEMIARTHROPLASTY HIP Right   . HEMIARTHROPLASTY HIP Left   . INCISION AND DRAINAGE Left 10/11/2014   Procedure: Removal of infected tibial sessmoid;  Surgeon: Albertine Patricia, DPM;  Location: [facility] ORS;  Service: Podiatry;  Laterality: Left;  . IRRIGATION AND DEBRIDEMENT FOOT Right 03/21/2017   Procedure: IRRIGATION AND DEBRIDEMENT FOOT right great toe amputation;  Surgeon: Sharlotte Alamo, DPM;  Location: [facility] OR[facility];  Service: Podiatry;  Laterality: Right;  . IRRIGATION AND DEBRIDEMENT FOOT Right 04/18/2017   Procedure: IRRIGATION AND DEBRIDEMENT FOOT and application wound vac;  Surgeon: Albertine Patricia, DPM;  Location: [facility];  Service: Podiatry;  Laterality: Right;  . PERIPHERAL VASCULAR BALLOON ANGIOPLASTY Right 04/17/2017   Procedure: PERIPHERAL VASCULAR BALLOON ANGIOPLASTY;  Surgeon: Katha Cabal, MD;  Location: [facility];  Service: Cardiovascular;  Laterality: Right;    Social History Social  History   Tobacco Use  . Smoking status: Former Smoker    Packs/day: 0.50    Years: 7.00    Pack years: 3.50    Types: Cigarettes    Last attempt to quit: 05/30/1989    Years since quitting: 27.9  . Smokeless tobacco: Never Used  Substance Use Topics  . Alcohol use: No    Alcohol/week: 0.0 oz  . Drug use: No    Family History Family History  Problem Relation Age of Onset  . Heart attack Mother     Allergies  Allergen Reactions  . Carbamazepine Other (See Comments)    Reaction: unknown  . Cymbalta [Duloxetine Hcl] Other (See Comments)    Confusion, disorientation  . Duloxetine     Other reaction(s): Other (See Comments) Confusion, disorientation  . Lyrica [Pregabalin] Other (See Comments)    Patient and son states this makes the patient confused.     REVIEW OF SYSTEMS (Negative unless checked)  Constitutional: [] Weight loss  [] Fever  [] Chills Cardiac: [] Chest pain   [] Chest pressure   [] Palpitations   [] Shortness of breath when laying flat   [] Shortness of breath with exertion. Vascular:  [x] Pain in legs with walking   [] Pain in legs at rest  [] History of DVT   [] Phlebitis   [] Swelling in legs   [] Varicose veins   [x] Non-healing ulcers Pulmonary:   [] Uses home oxygen   [] Productive cough   [] Hemoptysis   [] Wheeze  [] COPD   [] Asthma Neurologic:  [] Dizziness   [] Seizures   [] History of stroke   [] History of TIA  [] Aphasia   [] Vissual changes   [] Weakness or numbness in arm   [] Weakness or numbness in leg Musculoskeletal:   [] Joint swelling   [] Joint pain   [] Low back pain Hematologic:  [] Easy bruising  [] Easy bleeding   [] Hypercoagulable state   [] Anemic Gastrointestinal:  [] Diarrhea   [] Vomiting  [] Gastroesophageal reflux/heartburn   [] Difficulty swallowing. Genitourinary:  [] Chronic kidney disease   [] Difficult urination  [] Frequent urination   [] Blood in urine Skin:  [] Rashes   [x] Ulcers  Psychological:  [] History of anxiety   []  History of major  depression.  Physical Examination  There were no vitals filed for this visit. There is no height or weight on file to calculate BMI. Gen: WD/WN, NAD frail appearing seen in wheelchair Head: [place]/AT, No temporalis wasting.  Ear/Nose/Throat: Hearing grossly intact, nares w/o erythema or drainage Eyes: PER, EOMI, sclera nonicteric.  Neck: Supple, no large masses.   Pulmonary:  Good air movement, no audible wheezing bilaterally, no use of accessory muscles.  Cardiac: RRR, no JVD Vascular:  Vessel Right Left  Radial Palpable Palpable  PT Not Palpable Not Palpable  DP Not Palpable Not Palpable  Gastrointestinal: Non-distended. No guarding/no peritoneal signs.  Musculoskeletal: M/S 5/5 throughout.  No deformity or atrophy.  Neurologic: CN 2-12 intact. Symmetrical.  Speech is fluent. Motor exam as listed above. Psychiatric: Judgment intact, Mood & affect appropriate for pt's clinical situation. Dermatologic: No rashes + ulcers noted.  No changes consistent with cellulitis. Lymph : No lichenification or skin changes of chronic lymphedema.  CBC Lab Results  Component Value Date   WBC 7.6 04/20/2017   HGB 9.6 (L) 04/20/2017   HCT 30.3 (L) 04/20/2017   MCV 81.8 04/20/2017   PLT 227 04/20/2017    BMET    Component Value Date/Time   NA 136 04/19/2017 0339   NA 136 (A) 10/19/2014   NA 132 (L) 04/13/2014 1416   K 4.1 04/19/2017 0339   K 3.8 04/13/2014 1416   CL 106 04/19/2017 0339   CL 100 (L) 04/13/2014 1416   CO2 23 04/19/2017 0339   CO2 24 04/13/2014 1416   GLUCOSE 191 (H) 04/19/2017 0339   GLUCOSE 123 (H) 04/13/2014 1416   BUN 21 (H) 04/19/2017 0339   BUN 17 10/21/2014   BUN 15 04/13/2014 1416   CREATININE 1.42 (H) 04/19/2017 0339   CREATININE 1.04 (H) 04/13/2014 1416   CALCIUM 8.7 (L) 04/19/2017 0339   CALCIUM 8.7 (L) 04/13/2014 1416   GFRNONAA 34 (L) 04/19/2017 0339   GFRNONAA 51 (L) 04/13/2014 1416   GFRAA 39 (L) 04/19/2017 0339   GFRAA 60 (L) 04/13/2014 1416   CrCl  cannot be calculated (Unknown ideal weight.).  COAG Lab Results  Component Value Date   INR 1.13 03/19/2017   INR 0.94 02/07/2015   INR 1.0 05/26/2012    Radiology Ct Abdomen Pelvis Wo Contrast  Result Date: 04/17/2017 CLINICAL DATA:  Abdominal distension and decreased blood pressure following lower extremity angiography and bilateral common iliac artery stenting today. EXAM: CT ABDOMEN AND PELVIS WITHOUT CONTRAST TECHNIQUE: Multidetector CT imaging of the abdomen and pelvis was performed following the standard protocol without IV contrast. COMPARISON:  01/21/2017 FINDINGS: Lower chest: Mild motion artifact. Subsegmental atelectasis in the lung bases. No pleural effusion. Coronary artery atherosclerosis. Hepatobiliary: No focal liver abnormality is identified. There is no significant biliary dilatation status post cholecystectomy. Pancreas: 3.3 x 2.8 cm low-density/cystic mass in the pancreatic body is minimally larger than on the prior study. Smaller cystic lesions more distally in the pancreatic body and tail are similar to the prior study measuring up to 2.2 cm in size. No acute peripancreatic inflammatory changes. Spleen: Unremarkable. Adrenals/Urinary Tract: Unremarkable adrenal glands. Excreted contrast in the renal collecting systems and bladder. No hydronephrosis. Unchanged left lower pole renal scarring. Unchanged 1.3 cm left upper pole renal cyst and 8 mm left upper pole angiomyolipoma. Subcentimeter hypodense lesions in both kidneys are too small to fully characterize. Stomach/Bowel: The stomach is within normal limits. There is no evidence of bowel obstruction. A moderately large amount of stool is present in the rectum. Prior appendectomy. Vascular/Lymphatic: Abdominal aortic atherosclerosis without aneurysm. Bilateral common iliac artery stents extending into the distal abdominal aorta. No retroperitoneal hemorrhage about the abdominal aorta or iliac artery stents. Mildly prominent  gastrohepatic and periportal lymph nodes measure up to 10 mm in short axis and are overall stable to minimally larger. Reproductive: Limited assessment due to artifact from bilateral hip arthroplasties. There is likely an atrophic uterus. Ovaries are grossly unremarkable. Other: No intraperitoneal free fluid. Small  to moderate volume hematoma in the right inguinal region extending to the inferior aspect of the iliac fossa, partially obscured by streak artifact. Subcutaneous stranding extends inferiorly and medially into the groin. Musculoskeletal: Old lower anterior rib fractures on the right. Bilateral hip arthroplasties. Old fractures of the inferior left pubic ramus and left ilium extending to the SI joint. Diffuse thoracolumbar disc degeneration. IMPRESSION: 1. Small to moderate volume hematoma in the right inguinal region mildly extending into the iliac fossa. 2. Stable to minimally increased size of multilocular cystic pancreatic body lesion with similar appearance of smaller cystic lesions in the pancreatic tail. Abdominal MRI is recommended on a nonemergent basis when the patient is able to follow breath hold instructions. 3. Stable to minimally increased size of upper abdominal lymph nodes. 4.  Aortic Atherosclerosis (ICD10-I70.0). Electronically Signed   By: Logan Bores M.D.   On: 04/17/2017 16:27      Assessment/Plan 1. Atherosclerosis of native arteries of the extremities with ulceration ([place])  Recommend:  The patient has evidence of atherosclerosis of the lower extremities with claudication.  The patient does not voice lifestyle limiting changes at this point in time.  Noninvasive studies do not suggest clinically significant change.  No invasive studies, angiography or surgery at this time The patient should continue walking and begin a more formal exercise program.  The patient should continue antiplatelet therapy and aggressive treatment of the lipid abnormalities  No changes in the  patient's medications at this time  The patient should continue wearing graduated compression socks 10-15 mmHg strength to control the mild edema.    2. Essential hypertension Continue antihypertensive medications as already ordered, these medications have been reviewed and there are no changes at this time.   3. Uncontrolled type 2 diabetes mellitus with hypoglycemia without coma ([place]) Continue hypoglycemic medications as already ordered, these medications have been reviewed and there are no changes at this time.  Hgb A1C to be monitored as already arranged by primary service     Hortencia Pilar, MD  05/07/2017 11:33 AM

## 2017-05-12 DIAGNOSIS — I4891 Unspecified atrial fibrillation: Secondary | ICD-10-CM | POA: Diagnosis not present

## 2017-05-12 DIAGNOSIS — E119 Type 2 diabetes mellitus without complications: Secondary | ICD-10-CM | POA: Diagnosis not present

## 2017-05-12 DIAGNOSIS — I509 Heart failure, unspecified: Secondary | ICD-10-CM | POA: Diagnosis not present

## 2017-05-12 DIAGNOSIS — M869 Osteomyelitis, unspecified: Secondary | ICD-10-CM | POA: Diagnosis not present

## 2017-05-14 ENCOUNTER — Other Ambulatory Visit: Payer: Self-pay | Admitting: *Deleted

## 2017-05-14 DIAGNOSIS — B9562 Methicillin resistant Staphylococcus aureus infection as the cause of diseases classified elsewhere: Secondary | ICD-10-CM | POA: Diagnosis not present

## 2017-05-14 DIAGNOSIS — L97519 Non-pressure chronic ulcer of other part of right foot with unspecified severity: Secondary | ICD-10-CM | POA: Diagnosis not present

## 2017-05-14 DIAGNOSIS — M19072 Primary osteoarthritis, left ankle and foot: Secondary | ICD-10-CM | POA: Diagnosis not present

## 2017-05-14 DIAGNOSIS — E11621 Type 2 diabetes mellitus with foot ulcer: Secondary | ICD-10-CM | POA: Diagnosis not present

## 2017-05-14 DIAGNOSIS — I739 Peripheral vascular disease, unspecified: Secondary | ICD-10-CM | POA: Diagnosis not present

## 2017-05-14 DIAGNOSIS — Z9889 Other specified postprocedural states: Secondary | ICD-10-CM | POA: Diagnosis not present

## 2017-05-14 NOTE — Patient Outreach (Signed)
[place] [facility]) Care Management  05/14/2017  Sharon [name] 04-Nov-1933 [id number]  Post Acute visit to see patient at [facility]. Patient was on her way to OT when this social worker arrived. This social worker spoke with the discharge planner Lurlean Horns who stated that patient has a tentative discharge date of 05/16/17. She will discharge with [facility].   Plan: This social worker will follow up with patient on Monday, 05/18/17 to confirm that her discharge.    Sheralyn Boatman [facility] Care Management [phone]

## 2017-05-20 ENCOUNTER — Other Ambulatory Visit: Payer: Self-pay | Admitting: *Deleted

## 2017-05-20 NOTE — Patient Outreach (Signed)
[place] [facility]) Care Management  05/20/2017  Sharon [name] 11/12/1933 [id number]  Unsuccessful telephone encounter to Sharon [name] year female - follow up on referral received today from [place] for  Community CM services/transition of care/pt's recent SNF discharge.  View in EMR pt's recent hospitalization  April 4-9,2019 for  nonhealing  Right foot ulcer.  Per LCSW referral pt is a diabetic, was not aware of foot ulcer until son took her to [facility].   2 attempt calls made- phone kept ringing, unable to leave a voice Message.    Plan:  Unsuccessful outreach letter to be sent to pt and if no  Response in 10 business days, to close case and notify PCP.   Also to have  coworker Sharon Osmond RN CM covering for this RN CM  Follow up again within the next 2-3 business days telephonically.   [name].   [place] Care Management  [phone]

## 2017-05-20 NOTE — Patient Outreach (Signed)
[place] [facility]) Care Management  05/20/2017  Sharon [name] 1933/05/30 [id number]   Follow up phone call to patient to assess for community resource needs post discharge from [facility]. Patient's voicemail was full, no message able to be left.  Plan: This social worker will call back within the next 3 business days.   Sheralyn Boatman [facility] Care Management [phone]

## 2017-05-21 DIAGNOSIS — B9562 Methicillin resistant Staphylococcus aureus infection as the cause of diseases classified elsewhere: Secondary | ICD-10-CM | POA: Diagnosis not present

## 2017-05-21 DIAGNOSIS — Z9889 Other specified postprocedural states: Secondary | ICD-10-CM | POA: Diagnosis not present

## 2017-05-21 DIAGNOSIS — L97519 Non-pressure chronic ulcer of other part of right foot with unspecified severity: Secondary | ICD-10-CM | POA: Diagnosis not present

## 2017-05-21 DIAGNOSIS — M19072 Primary osteoarthritis, left ankle and foot: Secondary | ICD-10-CM | POA: Diagnosis not present

## 2017-05-21 DIAGNOSIS — I739 Peripheral vascular disease, unspecified: Secondary | ICD-10-CM | POA: Diagnosis not present

## 2017-05-21 DIAGNOSIS — E11621 Type 2 diabetes mellitus with foot ulcer: Secondary | ICD-10-CM | POA: Diagnosis not present

## 2017-05-22 ENCOUNTER — Other Ambulatory Visit: Payer: Self-pay | Admitting: *Deleted

## 2017-05-22 NOTE — Patient Outreach (Signed)
[place] [facility]) Care Management  05/22/2017  Sharon [name] 11/20/1933 [id number]    Follow up phone call to patient to assess for community resource needs post discharge from [facility]. Patient's voicemail was full, no message able to be left.   Plan: Contact letter already mailed to patient by [facility]           This social worker will try to reach patient again within 3 business               days.   Sheralyn Boatman [facility] Care Management [phone]

## 2017-05-25 ENCOUNTER — Other Ambulatory Visit: Payer: Self-pay | Admitting: *Deleted

## 2017-05-25 ENCOUNTER — Encounter: Payer: Self-pay | Admitting: *Deleted

## 2017-05-25 NOTE — Patient Outreach (Signed)
[place] [facility]) Care Management [place] Telephone Outreach, Transition of Care   05/25/2017  Sharon [name] [birth year] [id number]  Unsuccessful telephone outreach for transition of care to [place], [place] year female referred to [facility] after recent hospitalization April 4-9, 2019 for non-healing (R) foot ulcer; patient was discharged to SNF for rehabilitation, confirmation of patient's subsequent SNF discharge to home with [facility] RN CM referral placed on May 20, 2017.  Patient has history including, but not limited to, HTN; dCHF; DM Type II; PAD; and CKD stage III.  With call attempt today, received automated outgoing voicemail message stating that patient's voice mail is full, asking to enter remote access code; unable to leave patient HIPAA compliant voice message requesting call back.  Plan:  Will update patient's primary [facility] RN CM of today's unsuccessful telephone outreach to patient  Oneta Rack, RN, BSN, [place] Coordinator [facility] Care Management  [phone]

## 2017-05-26 ENCOUNTER — Ambulatory Visit: Payer: Self-pay | Admitting: *Deleted

## 2017-05-26 ENCOUNTER — Other Ambulatory Visit: Payer: Self-pay | Admitting: *Deleted

## 2017-05-26 NOTE — Patient Outreach (Signed)
[place] [facility]) Care Management  05/26/2017  Sharon [name] Feb 05, 1933 [id number]   Follow up phone call to patient to assess for community resource needs post discharge from [facility]. Patient's phone rang, there was no answer. Her voicemail was full and a message could not be left. [facility] RNCM is trying to reach patient as well. Contact letter has been sent.  Plan: This Education officer, museum will try to reach patient within 1 week.     Sharon [name] [facility] Care Management [phone]

## 2017-05-27 DIAGNOSIS — M86371 Chronic multifocal osteomyelitis, right ankle and foot: Secondary | ICD-10-CM | POA: Diagnosis not present

## 2017-05-27 DIAGNOSIS — L97513 Non-pressure chronic ulcer of other part of right foot with necrosis of muscle: Secondary | ICD-10-CM | POA: Diagnosis not present

## 2017-05-27 DIAGNOSIS — E1151 Type 2 diabetes mellitus with diabetic peripheral angiopathy without gangrene: Secondary | ICD-10-CM | POA: Diagnosis not present

## 2017-05-27 DIAGNOSIS — G603 Idiopathic progressive neuropathy: Secondary | ICD-10-CM | POA: Diagnosis not present

## 2017-05-28 DIAGNOSIS — M19072 Primary osteoarthritis, left ankle and foot: Secondary | ICD-10-CM | POA: Diagnosis not present

## 2017-05-28 DIAGNOSIS — Z9889 Other specified postprocedural states: Secondary | ICD-10-CM | POA: Diagnosis not present

## 2017-05-28 DIAGNOSIS — B9562 Methicillin resistant Staphylococcus aureus infection as the cause of diseases classified elsewhere: Secondary | ICD-10-CM | POA: Diagnosis not present

## 2017-05-28 DIAGNOSIS — I739 Peripheral vascular disease, unspecified: Secondary | ICD-10-CM | POA: Diagnosis not present

## 2017-05-28 DIAGNOSIS — E11621 Type 2 diabetes mellitus with foot ulcer: Secondary | ICD-10-CM | POA: Diagnosis not present

## 2017-05-28 DIAGNOSIS — L97519 Non-pressure chronic ulcer of other part of right foot with unspecified severity: Secondary | ICD-10-CM | POA: Diagnosis not present

## 2017-05-29 ENCOUNTER — Ambulatory Visit: Payer: Self-pay | Admitting: *Deleted

## 2017-05-29 ENCOUNTER — Other Ambulatory Visit: Payer: Self-pay | Admitting: *Deleted

## 2017-05-29 NOTE — Patient Outreach (Addendum)
[facility]) Care Management  05/29/2017  Sharon [name] [birth year] [id number]   Third unsuccessful telephone encounter to [facility], 84 year female  Follow up on referral received  from [place] on May 8,2019  For  Community CM services/transition of care/pt's recent SNF discharge. Pt admitted to rehab after recent hospitalization April 4-9,2019 for nonhealing right foot ulcer.  HIPAA compliant voice message left with RN CM's contact  Information.   Plan:  With this being the third phone call attempt and unsuccessful  Outreach letter sent on first attempt, if no response in 10 business days  From when letter sent to close case.   [name].   [place] Management  [phone]  Addendum:  Late entry for unable to leave a voice message as pt's phone kept ringing, memory full, unable to leave a voice message. HIPAA compliant voice  Message left with RN CM's contact information.   [name].   [place] Care Management  [phone]

## 2017-06-01 ENCOUNTER — Ambulatory Visit: Payer: Self-pay | Admitting: *Deleted

## 2017-06-01 ENCOUNTER — Other Ambulatory Visit: Payer: Self-pay | Admitting: *Deleted

## 2017-06-01 NOTE — Patient Outreach (Signed)
[place] [facility]) Care Management  06/01/2017  Stepfanie Yott Mode 1933-06-01 [id number]   Follow up phone call to patient to assess for community resource needs post discharge from [facility]. Patient's phone rang, there was no answer and no voicemail to leave a message. RNCM Rose Pierzchala has made multiple attempts to reach patient and has sent an Economist. If there is no response from patient within the 10 days of the letter being sent, RNCM will close case.    Sheralyn Boatman [facility] Care Management [phone]

## 2017-06-03 ENCOUNTER — Other Ambulatory Visit: Payer: Self-pay | Admitting: *Deleted

## 2017-06-03 NOTE — Patient Outreach (Addendum)
[facility]) Care Management  06/03/2017  Armenta Erskin Crockett 09/04/1933 [id number]   RN CM to close pt's case as result of  3 phone call attempts made, no responses plus  unsuccessful outreach letter sent to pt- no response in 10 business days.   Plan:  RN CM to notify [place] LCSW of case closure.             Plan to notify pt's PCP of case closure.   Addendum:  Late entry for episode resolved.   [name].   [place] Care Management  [phone]

## 2017-06-09 DIAGNOSIS — I509 Heart failure, unspecified: Secondary | ICD-10-CM | POA: Diagnosis not present

## 2017-06-09 DIAGNOSIS — L97519 Non-pressure chronic ulcer of other part of right foot with unspecified severity: Secondary | ICD-10-CM | POA: Diagnosis not present

## 2017-06-09 DIAGNOSIS — I110 Hypertensive heart disease with heart failure: Secondary | ICD-10-CM | POA: Diagnosis not present

## 2017-06-09 DIAGNOSIS — I872 Venous insufficiency (chronic) (peripheral): Secondary | ICD-10-CM | POA: Diagnosis not present

## 2017-06-09 DIAGNOSIS — E119 Type 2 diabetes mellitus without complications: Secondary | ICD-10-CM | POA: Diagnosis not present

## 2017-06-09 DIAGNOSIS — I4891 Unspecified atrial fibrillation: Secondary | ICD-10-CM | POA: Diagnosis not present

## 2017-06-11 ENCOUNTER — Other Ambulatory Visit: Payer: Self-pay | Admitting: *Deleted

## 2017-06-11 NOTE — Patient Outreach (Signed)
[place] [facility]) Care Management  06/11/2017  Sephora Boyar Hinks 1933/07/29 [id number]  [facility] RNCM closed patient's case as a result of 3 phone call attempts and no return call or response to outreach letter sent.   Sheralyn Boatman [facility] Care Management [phone]

## 2017-06-17 DIAGNOSIS — I83023 Varicose veins of left lower extremity with ulcer of ankle: Secondary | ICD-10-CM | POA: Diagnosis not present

## 2017-06-17 DIAGNOSIS — L97329 Non-pressure chronic ulcer of left ankle with unspecified severity: Secondary | ICD-10-CM | POA: Diagnosis not present

## 2017-06-17 DIAGNOSIS — L97511 Non-pressure chronic ulcer of other part of right foot limited to breakdown of skin: Secondary | ICD-10-CM | POA: Diagnosis not present

## 2017-06-21 DIAGNOSIS — F339 Major depressive disorder, recurrent, unspecified: Secondary | ICD-10-CM | POA: Diagnosis not present

## 2017-06-21 DIAGNOSIS — S81802D Unspecified open wound, left lower leg, subsequent encounter: Secondary | ICD-10-CM | POA: Diagnosis not present

## 2017-06-21 DIAGNOSIS — L97511 Non-pressure chronic ulcer of other part of right foot limited to breakdown of skin: Secondary | ICD-10-CM | POA: Diagnosis not present

## 2017-06-21 DIAGNOSIS — E1151 Type 2 diabetes mellitus with diabetic peripheral angiopathy without gangrene: Secondary | ICD-10-CM | POA: Diagnosis not present

## 2017-06-21 DIAGNOSIS — I509 Heart failure, unspecified: Secondary | ICD-10-CM | POA: Diagnosis not present

## 2017-06-21 DIAGNOSIS — Z7984 Long term (current) use of oral hypoglycemic drugs: Secondary | ICD-10-CM | POA: Diagnosis not present

## 2017-06-21 DIAGNOSIS — I2510 Atherosclerotic heart disease of native coronary artery without angina pectoris: Secondary | ICD-10-CM | POA: Diagnosis not present

## 2017-06-21 DIAGNOSIS — Z741 Need for assistance with personal care: Secondary | ICD-10-CM | POA: Diagnosis not present

## 2017-06-21 DIAGNOSIS — I110 Hypertensive heart disease with heart failure: Secondary | ICD-10-CM | POA: Diagnosis not present

## 2017-06-21 DIAGNOSIS — Z96643 Presence of artificial hip joint, bilateral: Secondary | ICD-10-CM | POA: Diagnosis not present

## 2017-06-21 DIAGNOSIS — Z87891 Personal history of nicotine dependence: Secondary | ICD-10-CM | POA: Diagnosis not present

## 2017-06-24 DIAGNOSIS — I482 Chronic atrial fibrillation: Secondary | ICD-10-CM | POA: Diagnosis not present

## 2017-06-24 DIAGNOSIS — Z0000 Encounter for general adult medical examination without abnormal findings: Secondary | ICD-10-CM | POA: Diagnosis not present

## 2017-06-24 DIAGNOSIS — E039 Hypothyroidism, unspecified: Secondary | ICD-10-CM | POA: Diagnosis not present

## 2017-06-24 DIAGNOSIS — E1151 Type 2 diabetes mellitus with diabetic peripheral angiopathy without gangrene: Secondary | ICD-10-CM | POA: Diagnosis not present

## 2017-06-24 DIAGNOSIS — E538 Deficiency of other specified B group vitamins: Secondary | ICD-10-CM | POA: Diagnosis not present

## 2017-06-24 DIAGNOSIS — I739 Peripheral vascular disease, unspecified: Secondary | ICD-10-CM | POA: Diagnosis not present

## 2017-07-01 DIAGNOSIS — E1142 Type 2 diabetes mellitus with diabetic polyneuropathy: Secondary | ICD-10-CM | POA: Diagnosis not present

## 2017-07-01 DIAGNOSIS — M86671 Other chronic osteomyelitis, right ankle and foot: Secondary | ICD-10-CM | POA: Diagnosis not present

## 2017-07-01 DIAGNOSIS — L97513 Non-pressure chronic ulcer of other part of right foot with necrosis of muscle: Secondary | ICD-10-CM | POA: Diagnosis not present

## 2017-07-03 DIAGNOSIS — R69 Illness, unspecified: Secondary | ICD-10-CM | POA: Diagnosis not present

## 2017-07-13 DIAGNOSIS — E1151 Type 2 diabetes mellitus with diabetic peripheral angiopathy without gangrene: Secondary | ICD-10-CM | POA: Diagnosis not present

## 2017-07-13 DIAGNOSIS — L97511 Non-pressure chronic ulcer of other part of right foot limited to breakdown of skin: Secondary | ICD-10-CM | POA: Diagnosis not present

## 2017-07-13 DIAGNOSIS — E1142 Type 2 diabetes mellitus with diabetic polyneuropathy: Secondary | ICD-10-CM | POA: Diagnosis not present

## 2017-07-13 DIAGNOSIS — Z96643 Presence of artificial hip joint, bilateral: Secondary | ICD-10-CM | POA: Diagnosis not present

## 2017-07-13 DIAGNOSIS — I2510 Atherosclerotic heart disease of native coronary artery without angina pectoris: Secondary | ICD-10-CM | POA: Diagnosis not present

## 2017-07-13 DIAGNOSIS — Z7984 Long term (current) use of oral hypoglycemic drugs: Secondary | ICD-10-CM | POA: Diagnosis not present

## 2017-07-13 DIAGNOSIS — S81802D Unspecified open wound, left lower leg, subsequent encounter: Secondary | ICD-10-CM | POA: Diagnosis not present

## 2017-07-13 DIAGNOSIS — I509 Heart failure, unspecified: Secondary | ICD-10-CM | POA: Diagnosis not present

## 2017-07-13 DIAGNOSIS — L97513 Non-pressure chronic ulcer of other part of right foot with necrosis of muscle: Secondary | ICD-10-CM | POA: Diagnosis not present

## 2017-07-13 DIAGNOSIS — Z87891 Personal history of nicotine dependence: Secondary | ICD-10-CM | POA: Diagnosis not present

## 2017-07-13 DIAGNOSIS — M86671 Other chronic osteomyelitis, right ankle and foot: Secondary | ICD-10-CM | POA: Diagnosis not present

## 2017-07-13 DIAGNOSIS — I110 Hypertensive heart disease with heart failure: Secondary | ICD-10-CM | POA: Diagnosis not present

## 2017-07-13 DIAGNOSIS — Z741 Need for assistance with personal care: Secondary | ICD-10-CM | POA: Diagnosis not present

## 2017-07-13 DIAGNOSIS — F339 Major depressive disorder, recurrent, unspecified: Secondary | ICD-10-CM | POA: Diagnosis not present

## 2017-07-20 DIAGNOSIS — R600 Localized edema: Secondary | ICD-10-CM | POA: Diagnosis not present

## 2017-07-20 DIAGNOSIS — E039 Hypothyroidism, unspecified: Secondary | ICD-10-CM | POA: Diagnosis not present

## 2017-07-27 ENCOUNTER — Encounter (INDEPENDENT_AMBULATORY_CARE_PROVIDER_SITE_OTHER): Payer: PPO

## 2017-07-27 ENCOUNTER — Ambulatory Visit (INDEPENDENT_AMBULATORY_CARE_PROVIDER_SITE_OTHER): Payer: PPO | Admitting: Vascular Surgery

## 2017-08-05 DIAGNOSIS — L97521 Non-pressure chronic ulcer of other part of left foot limited to breakdown of skin: Secondary | ICD-10-CM | POA: Diagnosis not present

## 2017-08-05 DIAGNOSIS — L97511 Non-pressure chronic ulcer of other part of right foot limited to breakdown of skin: Secondary | ICD-10-CM | POA: Diagnosis not present

## 2017-08-05 DIAGNOSIS — E1142 Type 2 diabetes mellitus with diabetic polyneuropathy: Secondary | ICD-10-CM | POA: Diagnosis not present

## 2017-08-05 DIAGNOSIS — L97513 Non-pressure chronic ulcer of other part of right foot with necrosis of muscle: Secondary | ICD-10-CM | POA: Diagnosis not present

## 2017-08-05 DIAGNOSIS — M86671 Other chronic osteomyelitis, right ankle and foot: Secondary | ICD-10-CM | POA: Diagnosis not present

## 2017-08-12 ENCOUNTER — Encounter (INDEPENDENT_AMBULATORY_CARE_PROVIDER_SITE_OTHER): Payer: Self-pay | Admitting: Vascular Surgery

## 2017-08-13 DIAGNOSIS — I509 Heart failure, unspecified: Secondary | ICD-10-CM | POA: Diagnosis not present

## 2017-08-13 DIAGNOSIS — S81802D Unspecified open wound, left lower leg, subsequent encounter: Secondary | ICD-10-CM | POA: Diagnosis not present

## 2017-08-13 DIAGNOSIS — S81801D Unspecified open wound, right lower leg, subsequent encounter: Secondary | ICD-10-CM | POA: Diagnosis not present

## 2017-08-13 DIAGNOSIS — Z7984 Long term (current) use of oral hypoglycemic drugs: Secondary | ICD-10-CM | POA: Diagnosis not present

## 2017-08-13 DIAGNOSIS — Z96643 Presence of artificial hip joint, bilateral: Secondary | ICD-10-CM | POA: Diagnosis not present

## 2017-08-13 DIAGNOSIS — I2510 Atherosclerotic heart disease of native coronary artery without angina pectoris: Secondary | ICD-10-CM | POA: Diagnosis not present

## 2017-08-13 DIAGNOSIS — I110 Hypertensive heart disease with heart failure: Secondary | ICD-10-CM | POA: Diagnosis not present

## 2017-08-13 DIAGNOSIS — F339 Major depressive disorder, recurrent, unspecified: Secondary | ICD-10-CM | POA: Diagnosis not present

## 2017-08-13 DIAGNOSIS — E1151 Type 2 diabetes mellitus with diabetic peripheral angiopathy without gangrene: Secondary | ICD-10-CM | POA: Diagnosis not present

## 2017-08-13 DIAGNOSIS — E1142 Type 2 diabetes mellitus with diabetic polyneuropathy: Secondary | ICD-10-CM | POA: Diagnosis not present

## 2017-08-13 DIAGNOSIS — Z87891 Personal history of nicotine dependence: Secondary | ICD-10-CM | POA: Diagnosis not present

## 2017-08-24 DIAGNOSIS — G603 Idiopathic progressive neuropathy: Secondary | ICD-10-CM | POA: Diagnosis not present

## 2017-08-31 DIAGNOSIS — G603 Idiopathic progressive neuropathy: Secondary | ICD-10-CM | POA: Diagnosis not present

## 2017-08-31 DIAGNOSIS — L03115 Cellulitis of right lower limb: Secondary | ICD-10-CM | POA: Diagnosis not present

## 2017-08-31 DIAGNOSIS — E039 Hypothyroidism, unspecified: Secondary | ICD-10-CM | POA: Diagnosis not present

## 2017-08-31 DIAGNOSIS — L03116 Cellulitis of left lower limb: Secondary | ICD-10-CM | POA: Diagnosis not present

## 2017-08-31 DIAGNOSIS — E118 Type 2 diabetes mellitus with unspecified complications: Secondary | ICD-10-CM | POA: Diagnosis not present

## 2017-08-31 DIAGNOSIS — E1165 Type 2 diabetes mellitus with hyperglycemia: Secondary | ICD-10-CM | POA: Diagnosis not present

## 2017-09-07 DIAGNOSIS — E1165 Type 2 diabetes mellitus with hyperglycemia: Secondary | ICD-10-CM | POA: Diagnosis not present

## 2017-09-07 DIAGNOSIS — E118 Type 2 diabetes mellitus with unspecified complications: Secondary | ICD-10-CM | POA: Diagnosis not present

## 2017-09-07 DIAGNOSIS — L03116 Cellulitis of left lower limb: Secondary | ICD-10-CM | POA: Diagnosis not present

## 2017-09-07 DIAGNOSIS — R600 Localized edema: Secondary | ICD-10-CM | POA: Diagnosis not present

## 2017-09-15 DIAGNOSIS — Z7984 Long term (current) use of oral hypoglycemic drugs: Secondary | ICD-10-CM | POA: Diagnosis not present

## 2017-09-15 DIAGNOSIS — Z87891 Personal history of nicotine dependence: Secondary | ICD-10-CM | POA: Diagnosis not present

## 2017-09-15 DIAGNOSIS — Z96643 Presence of artificial hip joint, bilateral: Secondary | ICD-10-CM | POA: Diagnosis not present

## 2017-09-15 DIAGNOSIS — I110 Hypertensive heart disease with heart failure: Secondary | ICD-10-CM | POA: Diagnosis not present

## 2017-09-15 DIAGNOSIS — E1142 Type 2 diabetes mellitus with diabetic polyneuropathy: Secondary | ICD-10-CM | POA: Diagnosis not present

## 2017-09-15 DIAGNOSIS — I509 Heart failure, unspecified: Secondary | ICD-10-CM | POA: Diagnosis not present

## 2017-09-15 DIAGNOSIS — S81801D Unspecified open wound, right lower leg, subsequent encounter: Secondary | ICD-10-CM | POA: Diagnosis not present

## 2017-09-15 DIAGNOSIS — I2510 Atherosclerotic heart disease of native coronary artery without angina pectoris: Secondary | ICD-10-CM | POA: Diagnosis not present

## 2017-09-15 DIAGNOSIS — F339 Major depressive disorder, recurrent, unspecified: Secondary | ICD-10-CM | POA: Diagnosis not present

## 2017-09-15 DIAGNOSIS — S81802D Unspecified open wound, left lower leg, subsequent encounter: Secondary | ICD-10-CM | POA: Diagnosis not present

## 2017-09-15 DIAGNOSIS — E1151 Type 2 diabetes mellitus with diabetic peripheral angiopathy without gangrene: Secondary | ICD-10-CM | POA: Diagnosis not present

## 2017-10-06 DIAGNOSIS — L97514 Non-pressure chronic ulcer of other part of right foot with necrosis of bone: Secondary | ICD-10-CM | POA: Diagnosis not present

## 2017-10-06 DIAGNOSIS — Z89421 Acquired absence of other right toe(s): Secondary | ICD-10-CM | POA: Diagnosis not present

## 2017-10-06 DIAGNOSIS — E1151 Type 2 diabetes mellitus with diabetic peripheral angiopathy without gangrene: Secondary | ICD-10-CM | POA: Diagnosis not present

## 2017-10-06 DIAGNOSIS — G603 Idiopathic progressive neuropathy: Secondary | ICD-10-CM | POA: Diagnosis not present

## 2017-10-06 DIAGNOSIS — E11621 Type 2 diabetes mellitus with foot ulcer: Secondary | ICD-10-CM | POA: Diagnosis not present

## 2017-10-07 DIAGNOSIS — M205X1 Other deformities of toe(s) (acquired), right foot: Secondary | ICD-10-CM | POA: Diagnosis not present

## 2017-10-07 DIAGNOSIS — E1142 Type 2 diabetes mellitus with diabetic polyneuropathy: Secondary | ICD-10-CM | POA: Diagnosis not present

## 2017-10-07 DIAGNOSIS — Z89421 Acquired absence of other right toe(s): Secondary | ICD-10-CM | POA: Diagnosis not present

## 2017-10-07 DIAGNOSIS — L97513 Non-pressure chronic ulcer of other part of right foot with necrosis of muscle: Secondary | ICD-10-CM | POA: Diagnosis not present

## 2017-10-07 DIAGNOSIS — B351 Tinea unguium: Secondary | ICD-10-CM | POA: Diagnosis not present

## 2017-10-07 DIAGNOSIS — L851 Acquired keratosis [keratoderma] palmaris et plantaris: Secondary | ICD-10-CM | POA: Diagnosis not present

## 2017-11-08 ENCOUNTER — Other Ambulatory Visit: Payer: Self-pay

## 2017-11-08 ENCOUNTER — Emergency Department
Admission: EM | Admit: 2017-11-08 | Discharge: 2017-11-08 | Disposition: A | Discharge status: Skilled Nursing Facility | Payer: PPO | Attending: Emergency Medicine | Admitting: Emergency Medicine

## 2017-11-08 ENCOUNTER — Emergency Department: Payer: PPO

## 2017-11-08 DIAGNOSIS — E1122 Type 2 diabetes mellitus with diabetic chronic kidney disease: Secondary | ICD-10-CM | POA: Insufficient documentation

## 2017-11-08 DIAGNOSIS — S0003XA Contusion of scalp, initial encounter: Secondary | ICD-10-CM | POA: Insufficient documentation

## 2017-11-08 DIAGNOSIS — E039 Hypothyroidism, unspecified: Secondary | ICD-10-CM | POA: Insufficient documentation

## 2017-11-08 DIAGNOSIS — Z9181 History of falling: Secondary | ICD-10-CM | POA: Diagnosis not present

## 2017-11-08 DIAGNOSIS — W19XXXA Unspecified fall, initial encounter: Secondary | ICD-10-CM

## 2017-11-08 DIAGNOSIS — I130 Hypertensive heart and chronic kidney disease with heart failure and stage 1 through stage 4 chronic kidney disease, or unspecified chronic kidney disease: Secondary | ICD-10-CM | POA: Diagnosis not present

## 2017-11-08 DIAGNOSIS — Z96643 Presence of artificial hip joint, bilateral: Secondary | ICD-10-CM | POA: Diagnosis not present

## 2017-11-08 DIAGNOSIS — I5032 Chronic diastolic (congestive) heart failure: Secondary | ICD-10-CM | POA: Diagnosis not present

## 2017-11-08 DIAGNOSIS — Y92129 Unspecified place in nursing home as the place of occurrence of the external cause: Secondary | ICD-10-CM | POA: Insufficient documentation

## 2017-11-08 DIAGNOSIS — Y939 Activity, unspecified: Secondary | ICD-10-CM | POA: Insufficient documentation

## 2017-11-08 DIAGNOSIS — Z79899 Other long term (current) drug therapy: Secondary | ICD-10-CM | POA: Insufficient documentation

## 2017-11-08 DIAGNOSIS — Y999 Unspecified external cause status: Secondary | ICD-10-CM | POA: Insufficient documentation

## 2017-11-08 DIAGNOSIS — Z7984 Long term (current) use of oral hypoglycemic drugs: Secondary | ICD-10-CM | POA: Insufficient documentation

## 2017-11-08 DIAGNOSIS — Z87891 Personal history of nicotine dependence: Secondary | ICD-10-CM | POA: Insufficient documentation

## 2017-11-08 DIAGNOSIS — Y92009 Unspecified place in unspecified non-institutional (private) residence as the place of occurrence of the external cause: Secondary | ICD-10-CM

## 2017-11-08 DIAGNOSIS — S0990XA Unspecified injury of head, initial encounter: Secondary | ICD-10-CM | POA: Diagnosis not present

## 2017-11-08 DIAGNOSIS — W06XXXA Fall from bed, initial encounter: Secondary | ICD-10-CM | POA: Insufficient documentation

## 2017-11-08 DIAGNOSIS — N183 Chronic kidney disease, stage 3 (moderate): Secondary | ICD-10-CM | POA: Diagnosis not present

## 2017-11-08 MED ORDER — ACETAMINOPHEN 325 MG PO TABS
650.0000 mg | ORAL_TABLET | Freq: Once | ORAL | Status: AC
  Administered 2017-11-08: 650 mg via ORAL
  Filled 2017-11-08: qty 2

## 2017-11-08 NOTE — ED Notes (Signed)
Awaiting Ems  To transport back to facility

## 2017-11-08 NOTE — ED Notes (Signed)
Pt assisted  To bathroom  Per w/c  Underwater and pants  Changed

## 2017-11-08 NOTE — ED Triage Notes (Signed)
Pt arrives via ems from [facility]. Ems states pt was trying get in wheelchair to go to bathroom when she slid off bed and hit head on nightstand. Pt denies LOC, skin tear present on left shin, currently not bleeding. Pt a&o x  4 on arrival no acute distress noted at this time. Pt 1.5-2" area of swelling on the back left side of head from fall. No open wound on head from fall.

## 2017-11-08 NOTE — Discharge Instructions (Addendum)
Sharon [name], your exam and CT scan are thankfully normal, following your fall. There is no injury to your head or brain. Take Tylenol as needed for any pain. Keep your small skin tear clean and covered. Follow-up with Dr. Sabra Heck as needed.

## 2017-11-08 NOTE — ED Provider Notes (Signed)
[facility] Emergency Department Provider Note ____________________________________________  Time seen: 1015  I have reviewed the triage vital signs and the nursing notes.  HISTORY  Chief Complaint  Fall  HPI Sharon [name] is a 84 y.o. female presents to the ED via EMS from [facility] living facility.  According to the patient, she had to get up this morning to go to the bathroom, so she pressed her call bell.  She got impatient waiting, and decided to get up on her own.  It was at that time, that she apparently slipped getting out of the bed, hitting her head on a nightstand that was near the bed.  She sustained a hematoma to the top of the scalp as well as an abrasion to the left lateral leg.  She denies any loss of consciousness, nausea, vomiting, or dizziness.  She also reports that she was only down for about 2 to 3 minutes before staff came to her aid.  She presents now for further evaluation following her injury.  She denies any nausea, vomiting, or dizziness patient also denies any chest pain, shortness of breath, or distal weakness.  Past Medical History:  Diagnosis Date  . Anemia   . Atrial fibrillation ([place])   . Cardiac arrest ([place])   . Cataract   . CHF (congestive heart failure) ([place])   . Diabetes mellitus without complication ([place])   . Edema    feet/ankles occas  . Hip fracture ([place])   . Hyperlipidemia   . Hypertension   . Hypothyroid   . Neuropathy   . Osteomyelitis ([place])    left first metatarsal  . Stroke [facility])     Patient Active Problem List   Diagnosis Date Noted  . Atherosclerosis of native arteries of the extremities with ulceration ([place]) 05/07/2017  . Non-healing ulcer ([place]) 04/16/2017  . Diabetic neuropathy ([place]) 04/07/2017  . Septic shock ([place]) 03/20/2017  . CKD (chronic kidney disease), stage III ([place]) 03/19/2017  . Goals of care, counseling/discussion 03/03/2017  . PAD (peripheral artery disease) ([place]) 02/03/2017  . Abdominal  aortic stenosis 02/03/2017  . Bilateral lower extremity edema 02/03/2017  . Anemia 01/29/2017  . B12 deficiency 01/29/2017  . Pancreatic mass 01/29/2017  . Hyponatremia 07/07/2015  . Foot ulcer ([place]) 02/07/2015  . ARF (acute renal failure) ([place]) 11/05/2014  . Diabetic foot infection ([place]) 10/10/2014  . Chronic diastolic heart failure ([place]) 05/31/2014  . HTN (hypertension) 05/31/2014  . DM (diabetes mellitus), type 2, uncontrolled ([place]) 05/31/2014    Past Surgical History:  Procedure Laterality Date  . ACHILLES TENDON SURGERY Left 02/08/2015   Procedure: ACHILLES LENGTHENING/KIDNER;  Surgeon: Albertine Patricia, DPM;  Location: [facility] ORS;  Service: Podiatry;  Laterality: Left;  . APPENDECTOMY    . CARDIAC CATHETERIZATION  08/25/13  . CATARACT EXTRACTION W/PHACO Left 07/20/2014   Procedure: CATARACT EXTRACTION PHACO AND INTRAOCULAR LENS PLACEMENT (IOC);  Surgeon: Leandrew Koyanagi, MD;  Location: [facility] ORS;  Service: Ophthalmology;  Laterality: Left;  [place]  1:18                 AP     23.6             CDE   9.69      lot #[phone]  . CHOLECYSTECTOMY    . CORONARY ANGIOPLASTY    . HALLUX VALGUS AKIN Left 02/08/2015   Procedure: HALLUX VALGUS AKIN/ KELLER;  Surgeon: Albertine Patricia, DPM;  Location: [facility] ORS;  Service: Podiatry;  Laterality: Left;  IVA with Local needs 1 hour for this case   . HEMIARTHROPLASTY HIP Right   . HEMIARTHROPLASTY HIP Left   . INCISION AND DRAINAGE Left 10/11/2014   Procedure: Removal of infected tibial sessmoid;  Surgeon: Albertine Patricia, DPM;  Location: [facility] ORS;  Service: Podiatry;  Laterality: Left;  . IRRIGATION AND DEBRIDEMENT FOOT Right 03/21/2017   Procedure: IRRIGATION AND DEBRIDEMENT FOOT right great toe amputation;  Surgeon: Sharlotte Alamo, DPM;  Location: [facility] ORS;  Service: Podiatry;  Laterality: Right;  . IRRIGATION AND DEBRIDEMENT FOOT Right 04/18/2017   Procedure: IRRIGATION AND DEBRIDEMENT FOOT and application wound vac;  Surgeon: Albertine Patricia, DPM;   Location: [facility] ORS;  Service: Podiatry;  Laterality: Right;  . PERIPHERAL VASCULAR BALLOON ANGIOPLASTY Right 04/17/2017   Procedure: PERIPHERAL VASCULAR BALLOON ANGIOPLASTY;  Surgeon: Katha Cabal, MD;  Location: [facility];  Service: Cardiovascular;  Laterality: Right;    Prior to Admission medications   Medication Sig Start Date End Date Taking? Authorizing Provider  acetaminophen (TYLENOL) 500 MG tablet Take 500 mg by mouth every 4 (four) hours as needed for mild pain or moderate pain.    [provider]  Amino Acids-Protein Hydrolys (FEEDING SUPPLEMENT, PRO-STAT SUGAR FREE 64,) LIQD Take 30 mLs by mouth 2 (two) times daily between meals.    [provider]  amiodarone (PACERONE) 200 MG tablet Take 1 tablet (200 mg total) by mouth daily. 03/24/17   Gladstone Lighter, MD  amitriptyline (ELAVIL) 10 MG tablet Take 10 mg by mouth at bedtime.    [provider]  atorvastatin (LIPITOR) 10 MG tablet Take by mouth.    [provider]  bisacodyl (DULCOLAX) 5 MG EC tablet Take 2 tablets (10 mg total) by mouth every other day. 03/25/17   Gladstone Lighter, MD  cholecalciferol (VITAMIN D) 1000 units tablet Take 1,000 Units by mouth daily.    [provider]  ferrous sulfate 325 (65 FE) MG tablet Take 325 mg by mouth 2 (two) times daily.    [provider]  furosemide (LASIX) 20 MG tablet Take 1 tablet (20 mg total) by mouth daily. 03/23/17   Gladstone Lighter, MD  gabapentin (NEURONTIN) 100 MG capsule Take 100 mg by mouth 4 (four) times daily.    [provider]  glimepiride (AMARYL) 2 MG tablet Take 2 mg by mouth daily with breakfast.     [provider]  GLUCERNA (GLUCERNA) LIQD Take 237 mLs by mouth 2 (two) times daily between meals.    [provider]  levothyroxine (SYNTHROID, LEVOTHROID) 112 MCG tablet Take 112 mcg by mouth daily before breakfast. 30 minutes before breakfast    [provider]   losartan (COZAAR) 25 MG tablet Take 25 mg by mouth 2 (two) times daily.    [provider]  metoprolol tartrate (LOPRESSOR) 25 MG tablet Take 1 tablet (25 mg total) by mouth 2 (two) times daily. 04/21/17   Saundra Shelling, MD  Multiple Vitamins-Minerals (PRESERVISION AREDS 2+MULTI VIT PO) Take 1 capsule by mouth 2 (two) times daily.     [provider]  nystatin cream (MYCOSTATIN) Apply 1 application topically 2 (two) times daily.    [provider]  piperacillin-tazobactam (ZOSYN) 3.375 (3-0.375) g injection Inject into the vein.    [provider]  potassium chloride (MICRO-K) 10 MEQ CR capsule Take 10 mEq by mouth daily.    [provider]  senna-docusate (SENOKOT-S) 8.6-50 MG tablet Take 1 tablet by mouth 2 (two) times daily.  03/23/17   Gladstone Lighter, MD  simvastatin (ZOCOR) 20 MG tablet Take 20 mg by mouth daily.    [provider]  Skin Protectants, Misc. (ENDIT EX) Apply 1 application topically 2 (two) times daily.    [provider]  traMADol (ULTRAM) 50 MG tablet Take 1 tablet (50 mg total) by mouth every 6 (six) hours as needed. 04/21/17   Saundra Shelling, MD  vitamin C (ASCORBIC ACID) 500 MG tablet Take 500 mg by mouth 2 (two) times daily.    [provider]    Allergies Carbamazepine; Cymbalta [duloxetine hcl]; Duloxetine; Lyrica [pregabalin]; and Temazepam  Family History  Problem Relation Age of Onset  . Heart attack Mother     Social History Social History   Tobacco Use  . Smoking status: Former Smoker    Packs/day: 0.50    Years: 7.00    Pack years: 3.50    Types: Cigarettes    Last attempt to quit: 05/30/1989    Years since quitting: 28.4  . Smokeless tobacco: Never Used  Substance Use Topics  . Alcohol use: No    Alcohol/week: 0.0 standard drinks  . Drug use: No    Review of Systems  Constitutional: Negative for fever. Eyes: Negative for visual changes. ENT: Negative for sore  throat. Cardiovascular: Negative for chest pain. Respiratory: Negative for shortness of breath. Gastrointestinal: Negative for abdominal pain, vomiting and diarrhea. Genitourinary: Negative for dysuria. Musculoskeletal: Negative for back pain. Skin: Negative for rash. Neurological: Negative for headaches, focal weakness or numbness. ____________________________________________  PHYSICAL EXAM:  VITAL SIGNS: ED Triage Vitals  Enc Vitals Group     BP 11/08/17 0946 (!) 191/49     Pulse Rate 11/08/17 0946 (!) 55     Resp --      Temp 11/08/17 0946 97.6 F (36.4 C)     Temp Source 11/08/17 0946 Oral     SpO2 11/08/17 0940 96 %     Weight 11/08/17 0947 139 lb (63 kg)     Height 11/08/17 0947 5\' 5"  (1.651 m)     Head Circumference --      Peak Flow --      Pain Score 11/08/17 0947 9     Pain Loc --      Pain Edu? --      Excl. in [place]? --     Constitutional: Alert and oriented. Well appearing and in no distress. Head: Normocephalic and atraumatic.  Soft tissue swelling to the top of the left scalp consistent with a focal hematoma.  No superficial abrasion, laceration, or bleeding is noted. Eyes: Conjunctivae are normal. Normal extraocular movements Ears: Canals clear. TMs intact bilaterally. Nose: No congestion/rhinorrhea/epistaxis. Mouth/Throat: Mucous membranes are moist. Neck: Supple. Normal ROM.  No distracting midline tenderness. Cardiovascular: Normal rate, regular rhythm. Normal distal pulses. Respiratory: Normal respiratory effort. No wheezes/rales/rhonchi. Gastrointestinal: Soft and nontender. No distention. Musculoskeletal: Nontender with normal range of motion in all lower extremities.  Neurologic:  Normal gait without ataxia. Normal speech and language. No gross focal neurologic deficits are appreciated. Skin:  Skin is warm, dry and intact. No rash noted.  Superficial skin tear to the left shin. ____________________________________________   RADIOLOGY  CT Head w/o  CM  IMPRESSION: 1. Left vertex scalp soft tissue injury without underlying skull fracture. 2.  Noncontrast CT appearance of the brain remains normal for age. 3. New fluid level in the left maxillary sinus could be inflammatory. If there is acute left facial pain recommend noncontrast  Face CT. ____________________________________________  PROCEDURES  Procedures Tylenol 650 mg PO Wound dressing ____________________________________________  INITIAL IMPRESSION / ASSESSMENT AND PLAN / ED COURSE  Geriatric patient with ED evaluation of injury sustained following a fall at home.  Patient presents via EMS for mechanical fall.  Exam is overall benign and reassuring.  Patient CT scan is negative for any acute intracranial process.  Patient is discharged with instructions to take Tylenol as needed for pain and discomfort.  She will follow-up with her primary provider for ongoing symptom management. ____________________________________________  FINAL CLINICAL IMPRESSION(S) / ED DIAGNOSES  Final diagnoses:  Fall in home, initial encounter  Contusion of scalp, initial encounter      Melvenia Needles, PA-C 11/08/17 1212    Schuyler Amor, MD 11/19/17 [phone]

## 2017-11-10 DIAGNOSIS — W06XXXA Fall from bed, initial encounter: Secondary | ICD-10-CM | POA: Diagnosis not present

## 2017-11-10 DIAGNOSIS — S81812A Laceration without foreign body, left lower leg, initial encounter: Secondary | ICD-10-CM | POA: Diagnosis not present

## 2017-11-10 DIAGNOSIS — R600 Localized edema: Secondary | ICD-10-CM | POA: Diagnosis not present

## 2017-11-10 DIAGNOSIS — R296 Repeated falls: Secondary | ICD-10-CM | POA: Diagnosis not present

## 2017-11-30 ENCOUNTER — Other Ambulatory Visit: Payer: Self-pay

## 2017-11-30 ENCOUNTER — Encounter: Payer: Self-pay | Admitting: Physical Therapy

## 2017-11-30 ENCOUNTER — Ambulatory Visit: Payer: PPO | Attending: Podiatry | Admitting: Physical Therapy

## 2017-11-30 DIAGNOSIS — E1142 Type 2 diabetes mellitus with diabetic polyneuropathy: Secondary | ICD-10-CM | POA: Diagnosis not present

## 2017-11-30 DIAGNOSIS — M6281 Muscle weakness (generalized): Secondary | ICD-10-CM

## 2017-11-30 DIAGNOSIS — R262 Difficulty in walking, not elsewhere classified: Secondary | ICD-10-CM

## 2017-11-30 NOTE — Therapy (Signed)
[place] MAIN [facility] SERVICES [address] [place], [place], [address] Phone: [phone]   Fax:  [phone]  Physical Therapy Evaluation  Patient Details  Name: Sharon [name] MRN: [id number] Date of Birth: [birth year] Referring Provider (PT): [place], [place]   Encounter Date: 11/30/2017  PT End of Session - 11/30/17 0919    Visit Number  1    Number of Visits  17    Date for PT Re-Evaluation  01/25/18    PT Start Time  0900    PT Stop Time  1000    PT Time Calculation (min)  60 min    Equipment Utilized During Treatment  Gait belt    Activity Tolerance  Patient tolerated treatment well    Behavior During Therapy  WFL for tasks assessed/performed       Past Medical History:  Diagnosis Date  . Anemia   . Atrial fibrillation ([place])   . Cardiac arrest ([place])   . Cataract   . CHF (congestive heart failure) ([place])   . Diabetes mellitus without complication ([place])   . Edema    feet/ankles occas  . Hip fracture ([place])   . Hyperlipidemia   . Hypertension   . Hypothyroid   . Neuropathy   . Osteomyelitis ([place])    left first metatarsal  . Stroke [facility])     Past Surgical History:  Procedure Laterality Date  . ACHILLES TENDON SURGERY Left 02/08/2015   Procedure: ACHILLES LENGTHENING/KIDNER;  Surgeon: Albertine Patricia, DPM;  Location: [facility] ORS;  Service: Podiatry;  Laterality: Left;  . APPENDECTOMY    . CARDIAC CATHETERIZATION  08/25/13  . CATARACT EXTRACTION W/PHACO Left 07/20/2014   Procedure: CATARACT EXTRACTION PHACO AND INTRAOCULAR LENS PLACEMENT (IOC);  Surgeon: Leandrew Koyanagi, MD;  Location: [facility] ORS;  Service: Ophthalmology;  Laterality: Left;  [place]  1:18                 AP     23.6             CDE   9.69      lot #[phone]  . CHOLECYSTECTOMY    . CORONARY ANGIOPLASTY    . HALLUX VALGUS AKIN Left 02/08/2015   Procedure: HALLUX VALGUS AKIN/ KELLER;  Surgeon: Albertine Patricia, DPM;  Location: [facility] ORS;  Service: Podiatry;   Laterality: Left;  IVA with Local needs 1 hour for this case   . HEMIARTHROPLASTY HIP Right   . HEMIARTHROPLASTY HIP Left   . INCISION AND DRAINAGE Left 10/11/2014   Procedure: Removal of infected tibial sessmoid;  Surgeon: Albertine Patricia, DPM;  Location: [facility] ORS;  Service: Podiatry;  Laterality: Left;  . IRRIGATION AND DEBRIDEMENT FOOT Right 03/21/2017   Procedure: IRRIGATION AND DEBRIDEMENT FOOT right great toe amputation;  Surgeon: Sharlotte Alamo, DPM;  Location: [facility] ORS;  Service: Podiatry;  Laterality: Right;  . IRRIGATION AND DEBRIDEMENT FOOT Right 04/18/2017   Procedure: IRRIGATION AND DEBRIDEMENT FOOT and application wound vac;  Surgeon: Albertine Patricia, DPM;  Location: [facility] ORS;  Service: Podiatry;  Laterality: Right;  . PERIPHERAL VASCULAR BALLOON ANGIOPLASTY Right 04/17/2017   Procedure: PERIPHERAL VASCULAR BALLOON ANGIOPLASTY;  Surgeon: Katha Cabal, MD;  Location: [place] [facility];  Service: Cardiovascular;  Laterality: Right;    There were no vitals filed for this visit.   Subjective Assessment - 11/30/17 0907    Subjective  Patient has not  walked since march ( 8 months ago) and then walked with therapy at the [facility]  in august.     Pertinent History  Patient stopped walking in march. She had a fall at home and she went to the ER and she had infections in bilateral feet. she had her great toe R foot ampuated and she had a wound on L foot. She went to the hospital and then to [facility]. She went back to the MD and had a second surgery in March and then went to another SNF for 3-4 months. She was not able to walk due to being non weight bearing. She was discharged to [facility] assisted living facility. she had PT 2 x / week. She was able to walk short distances with RW.  She is able to dress herself, she takes herself to the bathroom with assist. She has had several falls.     Limitations  Walking    How long can you stand comfortably?  15 mins if she is holding on     Patient Stated Goals  She wants to walk again.     Currently in Pain?  No/denies    Multiple Pain Sites  No         [facility] PT Assessment - 11/30/17 0914      Assessment   Medical Diagnosis  weakness    Referring Provider (PT)  TROXLER, MATTHEW    Onset Date/Surgical Date  11/24/17    Hand Dominance  Right    Next MD Visit  11/30/17    Prior Therapy  yes      Restrictions   Weight Bearing Restrictions  No      Balance Screen   Has the patient fallen in the past 6 months  Yes    How many times?  2    Has the patient had a decrease in activity level because of a fear of falling?   Yes    Is the patient reluctant to leave their home because of a fear of falling?   No      Home Environment   Living Environment  Assisted living      Prior Function   Level of Independence  Needs assistance with ADLs;Needs assistance with homemaking;Needs assistance with gait;Needs assistance with transfers    Vocation  Retired    Leisure  play cards, TV      Cognition   Overall Cognitive Status  Within Functional Limits for tasks assessed          POSTURE: standing flexed trunk    PROM/AROM: WFL AROM and WFL PROM BUE and BLE  STRENGTH:  Graded on a 0-5 scale Muscle Group Left Right  Shoulder flex 3/5 3/5  Shoulder Abd 3/5 3/5  Shoulder Ext 3/5 3/5  Shoulder IR/ER NT NT  Elbow 3/5 3/5  Wrist/hand NT NT  Hip Flex 3+/5 3/5  Hip Abd 2+/5 2+/5  Hip Add 2/5 2/5  Hip Ext NT NT  Hip IR/ER 3/5 3/5  Knee Flex 5/5 5/5  Knee Ext 5/5 5/5  Ankle DF 3/5 3/5  Ankle PF NT NT   SENSATION: numbness in B feet   FUNCTIONAL MOBILITY: Patient needs min assist for transfers wc to mat Patient is not able to perform supine to prone  Bed mobiity Supine to sidelying mobility is slow  Supine to sit is slow and independent   BALANCE: Static Standing Balance  Normal Able to maintain standing balance against maximal resistance   Good Able to maintain standing balance against moderate resistance    Good-/Fair+ Able to  maintain standing balance against minimal resistance   Fair Able to stand unsupported without UE support and without LOB for 1-2 min   Fair- Requires Min A and UE support to maintain standing without loss of balance   Poor+ Requires mod A and UE support to maintain standing without loss of balance x  Poor Requires max A and UE support to maintain standing balance without loss    Standing Dynamic Balance  Normal Stand independently unsupported, able to weight shift and cross midline maximally   Good Stand independently unsupported, able to weight shift and cross midline moderately   Good-/Fair+ Stand independently unsupported, able to weight shift across midline minimally   Fair Stand independently unsupported, weight shift, and reach ipsilaterally, loss of balance when crossing midline   Poor+ Able to stand with Min A and reach ipsilaterally, unable to weight shift   Poor Able to stand with Mod A and minimally reach ipsilaterally, unable to cross midline. x     GAIT: Patient ambulates with RW 100 feet with min assist and wc following with unsteady gait  OUTCOME MEASURES: TEST Outcome Interpretation  5 times sit<>stand 20 inch wc with cushion  37 sec >60 yo, >15 sec indicates increased risk for falls  10 meter walk test .20                 m/s <1.0 m/s indicates increased risk for falls; limited community ambulator                    Treatment: HEP : Bridging  X 10 x 2  SLR x 10 x 2  Marching seated x 10 x 2 Hip abd/ ER with GTB x 10          Objective measurements completed on examination: See above findings.              PT Education - 11/30/17 0918    Education Details  plan of care    Person(s) Educated  Patient    Methods  Explanation    Comprehension  Verbalized understanding       PT Short Term Goals - 11/30/17 0943      PT SHORT TERM GOAL #1   Title  Patient will be independent in home exercise program to improve  strength/mobility for better functional independence with ADLs.    Time  4    Period  Weeks    Status  New    Target Date  12/28/17      PT SHORT TERM GOAL #2   Title  Patient will transfer from wc to bed with SBA and RW.    Baseline  needs min assist     Time  4    Period  Weeks    Status  New    Target Date  12/28/17      PT SHORT TERM GOAL #3   Title  Patient will improve her 74 MW test from . 2 m/sec to . 5 m/sec with RW    Baseline  . 2 m/sec    Time  4    Period  Weeks    Status  New    Target Date  12/28/17        PT Long Term Goals - 11/30/17 0944      PT LONG TERM GOAL #1   Title  Patient will be able to perform 5 sit to stand transfers with RW using her hands to push up from a  17 inch chair    Baseline  performs sit to stand transfer with UE assist from 20 inch wc 5 x in 37 seconds    Time  8    Period  Weeks    Status  New    Target Date  01/25/18      PT LONG TERM GOAL #2   Title  Patient will be able to stand x 10 mins with SBA to be able to perform standing activiites at the counter    Baseline  patient is able to stand for 6  min    Time  8    Period  Weeks    Status  New    Target Date  01/25/18      PT LONG TERM GOAL #3   Title  Patient will be able to ambulate with RW and SBA  assist x 200 feet     Baseline  Ambulates with min assist and wc following 100 feet     Time  8    Period  Weeks    Status  New    Target Date  01/25/18      PT LONG TERM GOAL #4   Title  patient will impvoe BLE hip strength to 4/5 to improve mobility and safety    Baseline  2/5 hip abd and add,, 3/5 hip flex, unable to test hip ext    Time  8    Period  Weeks    Status  New    Target Date  01/25/18      PT LONG TERM GOAL #5   Title  Patient will improve static standing balance from poor plus to fair and be able to reach for objects on the counter top.    Baseline  poor plus static standing balance    Time  8    Period  Weeks    Status  New    Target Date   01/25/18             Plan - 11/30/17 0946    Clinical Impression Statement  Patient is 84 year old female who has not ambulated in 8 months following B foot infections and s/p right great toe amputation. She lives in assisted living and needs min assist for transfers wc to bed and needs assist for sit to supine bed mobility. She amublates with min assist and wc following with RW. Her dynamic standing balance is poor and static standing balance is  P+. Patient has decreased strength BLE hips and ankles. Patient will benefit from skilled PT to improve balance, strength, safety, gait and overall mobility to improve her quality of life.     Clinical Presentation  Stable    Clinical Decision Making  Low    Rehab Potential  Fair    PT Frequency  2x / week    PT Duration  8 weeks    PT Treatment/Interventions  Aquatic Therapy;Gait training;Therapeutic exercise;Therapeutic activities;Functional mobility training;Balance training;Neuromuscular re-education;Patient/family education;Moist Heat    PT Next Visit Plan  LE strengthening, gait training    PT Home Exercise Plan  bridges, marching, hip abd/ER GTB, SLR    Consulted and Agree with Plan of Care  Patient;Other (Comment)   friend      Patient will benefit from skilled therapeutic intervention in order to improve the following deficits and impairments:  Abnormal gait, Decreased balance, Decreased endurance, Decreased mobility, Difficulty walking, Impaired UE functional use, Decreased strength, Decreased activity tolerance  Visit Diagnosis:  Muscle weakness (generalized) - Plan: PT plan of care cert/re-cert  Difficulty in walking, not elsewhere classified - Plan: PT plan of care cert/re-cert     Problem List Patient Active Problem List   Diagnosis Date Noted  . Atherosclerosis of native arteries of the extremities with ulceration ([place]) 05/07/2017  . Non-healing ulcer ([place]) 04/16/2017  . Diabetic neuropathy ([place]) 04/07/2017  . Septic  shock ([place]) 03/20/2017  . CKD (chronic kidney disease), stage III ([place]) 03/19/2017  . Goals of care, counseling/discussion 03/03/2017  . PAD (peripheral artery disease) ([place]) 02/03/2017  . Abdominal aortic stenosis 02/03/2017  . Bilateral lower extremity edema 02/03/2017  . Anemia 01/29/2017  . B12 deficiency 01/29/2017  . Pancreatic mass 01/29/2017  . Hyponatremia 07/07/2015  . Foot ulcer ([place]) 02/07/2015  . ARF (acute renal failure) ([place]) 11/05/2014  . Diabetic foot infection ([place]) 10/10/2014  . Chronic diastolic heart failure ([place]) 05/31/2014  . HTN (hypertension) 05/31/2014  . DM (diabetes mellitus), type 2, uncontrolled ([place]) 05/31/2014    Alanson Puls, [place] DPT 11/30/2017, 10:25 AM  [place] MAIN [facility] SERVICES [address] [place], [place], [address] Phone: [phone]   Fax:  [phone]  Name: Sharon [name] MRN: [id number] Date of Birth: November 22, 1933

## 2017-12-02 ENCOUNTER — Ambulatory Visit: Payer: PPO | Admitting: Physical Therapy

## 2017-12-02 ENCOUNTER — Encounter: Payer: Self-pay | Admitting: Physical Therapy

## 2017-12-02 DIAGNOSIS — J340 Abscess, furuncle and carbuncle of nose: Secondary | ICD-10-CM | POA: Diagnosis not present

## 2017-12-02 DIAGNOSIS — M6281 Muscle weakness (generalized): Secondary | ICD-10-CM | POA: Diagnosis not present

## 2017-12-02 DIAGNOSIS — L03116 Cellulitis of left lower limb: Secondary | ICD-10-CM | POA: Diagnosis not present

## 2017-12-02 DIAGNOSIS — R262 Difficulty in walking, not elsewhere classified: Secondary | ICD-10-CM

## 2017-12-02 NOTE — Therapy (Signed)
[place] MAIN [facility] SERVICES [address] [place], [place], [address] Phone: [phone]   Fax:  [phone]  Physical Therapy Treatment  Patient Details  Name: Sharon [name] MRN: [id number] Date of Birth: 1933/11/30 Referring Provider (PT): [place], [place]   Encounter Date: 12/02/2017  PT End of Session - 12/02/17 1316    Visit Number  2    Number of Visits  17    Date for PT Re-Evaluation  01/25/18    PT Start Time  0100    PT Stop Time  0155    PT Time Calculation (min)  55 min    Equipment Utilized During Treatment  Gait belt    Activity Tolerance  Patient tolerated treatment well    Behavior During Therapy  WFL for tasks assessed/performed       Past Medical History:  Diagnosis Date  . Anemia   . Atrial fibrillation ([place])   . Cardiac arrest ([place])   . Cataract   . CHF (congestive heart failure) ([place])   . Diabetes mellitus without complication ([place])   . Edema    feet/ankles occas  . Hip fracture ([place])   . Hyperlipidemia   . Hypertension   . Hypothyroid   . Neuropathy   . Osteomyelitis ([place])    left first metatarsal  . Stroke [place])     Past Surgical History:  Procedure Laterality Date  . ACHILLES TENDON SURGERY Left 02/08/2015   Procedure: ACHILLES LENGTHENING/KIDNER;  Surgeon: Albertine Patricia, DPM;  Location: [facility];  Service: Podiatry;  Laterality: Left;  . APPENDECTOMY    . CARDIAC CATHETERIZATION  08/25/13  . CATARACT EXTRACTION W/PHACO Left 07/20/2014   Procedure: CATARACT EXTRACTION PHACO AND INTRAOCULAR LENS PLACEMENT (IOC);  Surgeon: Leandrew Koyanagi, MD;  Location: [facility] OR[facility];  Service: Ophthalmology;  Laterality: Left;  [place]  1:18                 AP     23.6             CDE   9.69      lot #[phone]  . CHOLECYSTECTOMY    . CORONARY ANGIOPLASTY    . HALLUX VALGUS AKIN Left 02/08/2015   Procedure: HALLUX VALGUS AKIN/ KELLER;  Surgeon: Albertine Patricia, DPM;  Location: [facility];  Service: Podiatry;   Laterality: Left;  IVA with Local needs 1 hour for this case   . HEMIARTHROPLASTY HIP Right   . HEMIARTHROPLASTY HIP Left   . INCISION AND DRAINAGE Left 10/11/2014   Procedure: Removal of infected tibial sessmoid;  Surgeon: Albertine Patricia, DPM;  Location: [facility];  Service: Podiatry;  Laterality: Left;  . IRRIGATION AND DEBRIDEMENT FOOT Right 03/21/2017   Procedure: IRRIGATION AND DEBRIDEMENT FOOT right great toe amputation;  Surgeon: Sharlotte Alamo, DPM;  Location: [facility];  Service: Podiatry;  Laterality: Right;  . IRRIGATION AND DEBRIDEMENT FOOT Right 04/18/2017   Procedure: IRRIGATION AND DEBRIDEMENT FOOT and application wound vac;  Surgeon: Albertine Patricia, DPM;  Location: [facility];  Service: Podiatry;  Laterality: Right;  . PERIPHERAL VASCULAR BALLOON ANGIOPLASTY Right 04/17/2017   Procedure: PERIPHERAL VASCULAR BALLOON ANGIOPLASTY;  Surgeon: Katha Cabal, MD;  Location: [place] [facility];  Service: Cardiovascular;  Laterality: Right;    There were no vitals filed for this visit.  Subjective Assessment - 12/02/17 1315    Subjective  Patient is doing well with no changes, no pain.    Pertinent History  Patient stopped walking in march.  She had a fall at home and she went to the ER and she had infections in bilateral feet. she had her great toe R foot ampuated and she had a wound on L foot. She went to the hospital and then to [facility]. She went back to the MD and had a second surgery in March and then went to another SNF for 3-4 months. She was not able to walk due to being non weight bearing. She was discharged to [facility] assisted living facility. she had PT 2 x / week. She was able to walk short distances with RW.  She is able to dress herself, she takes herself to the bathroom with assist. She has had several falls.     Limitations  Walking    How long can you stand comfortably?  15 mins if she is holding on    Patient Stated Goals  She wants to walk again.     Currently in  Pain?  No/denies    Multiple Pain Sites  No       Treatment: Nu-step x 5 mins L 1  Standing hip abd x 10 BLE Standing heel raises x 20 Standing hip ext x 10  Bridges x 20 hooklying abd/ER with gTB x 20  hooklying marching x 20 with 4 lbs SAQ with 4 lbs x 20 with 5 sec hold Sit to stand x 5 x 2 sets Patient transfers from wc to stand in parallel bars with SBA wc <> nu-step with min assist wc <> mat with min assist  Gait training with RW and min assist for 100 feet x 3 sets  Patient has muscle fatigue and limited standing tolerance but has no pain during treatment. Written handouts given for HEP                        PT Education - 12/02/17 1315    Education Details  HEP    Person(s) Educated  Patient    Methods  Explanation    Comprehension  Verbalized understanding;Returned demonstration       PT Short Term Goals - 11/30/17 0943      PT SHORT TERM GOAL #1   Title  Patient will be independent in home exercise program to improve strength/mobility for better functional independence with ADLs.    Time  4    Period  Weeks    Status  New    Target Date  12/28/17      PT SHORT TERM GOAL #2   Title  Patient will transfer from wc to bed with SBA and RW.    Baseline  needs min assist     Time  4    Period  Weeks    Status  New    Target Date  12/28/17      PT SHORT TERM GOAL #3   Title  Patient will improve her 63 MW test from . 2 m/sec to . 5 m/sec with RW    Baseline  . 2 m/sec    Time  4    Period  Weeks    Status  New    Target Date  12/28/17        PT Long Term Goals - 11/30/17 0944      PT LONG TERM GOAL #1   Title  Patient will be able to perform 5 sit to stand transfers with RW using her hands to push up from a 17 inch  chair    Baseline  performs sit to stand transfer with UE assist from 20 inch wc 5 x in 37 seconds    Time  8    Period  Weeks    Status  New    Target Date  01/25/18      PT LONG TERM GOAL #2   Title  Patient  will be able to stand x 10 mins with SBA to be able to perform standing activiites at the counter    Baseline  patient is able to stand for 6  min    Time  8    Period  Weeks    Status  New    Target Date  01/25/18      PT LONG TERM GOAL #3   Title  Patient will be able to ambulate with RW and SBA  assist x 200 feet     Baseline  Ambulates with min assist and wc following 100 feet     Time  8    Period  Weeks    Status  New    Target Date  01/25/18      PT LONG TERM GOAL #4   Title  patient will impvoe BLE hip strength to 4/5 to improve mobility and safety    Baseline  2/5 hip abd and add,, 3/5 hip flex, unable to test hip ext    Time  8    Period  Weeks    Status  New    Target Date  01/25/18      PT LONG TERM GOAL #5   Title  Patient will improve static standing balance from poor plus to fair and be able to reach for objects on the counter top.    Baseline  poor plus static standing balance    Time  8    Period  Weeks    Status  New    Target Date  01/25/18            Plan - 12/02/17 1317    Clinical Impression Statement  Patient demonstrates understanding of HEP with minimal corrections needed. Patient challenged with sit to stand transfer with multiple repetitions due to fatigue. Weak LE combined with weak core musculature results in poor postural control and back pain. Patient will continue to benefit from skilled physical therapy to improve pain and mobility.    Rehab Potential  Fair    PT Frequency  2x / week    PT Duration  8 weeks    PT Treatment/Interventions  Aquatic Therapy;Gait training;Therapeutic exercise;Therapeutic activities;Functional mobility training;Balance training;Neuromuscular re-education;Patient/family education;Moist Heat    PT Next Visit Plan  LE strengthening, gait training    PT Home Exercise Plan  bridges, marching, hip abd/ER GTB, SLR    Consulted and Agree with Plan of Care  Patient;Other (Comment)   friend      Patient will benefit  from skilled therapeutic intervention in order to improve the following deficits and impairments:  Abnormal gait, Decreased balance, Decreased endurance, Decreased mobility, Difficulty walking, Impaired UE functional use, Decreased strength, Decreased activity tolerance  Visit Diagnosis: Muscle weakness (generalized)  Difficulty in walking, not elsewhere classified     Problem List Patient Active Problem List   Diagnosis Date Noted  . Atherosclerosis of native arteries of the extremities with ulceration ([place]) 05/07/2017  . Non-healing ulcer ([place]) 04/16/2017  . Diabetic neuropathy ([place]) 04/07/2017  . Septic shock ([place]) 03/20/2017  . CKD (chronic kidney disease), stage III ([place])  03/19/2017  . Goals of care, counseling/discussion 03/03/2017  . PAD (peripheral artery disease) ([place]) 02/03/2017  . Abdominal aortic stenosis 02/03/2017  . Bilateral lower extremity edema 02/03/2017  . Anemia 01/29/2017  . B12 deficiency 01/29/2017  . Pancreatic mass 01/29/2017  . Hyponatremia 07/07/2015  . Foot ulcer ([place]) 02/07/2015  . ARF (acute renal failure) ([place]) 11/05/2014  . Diabetic foot infection ([place]) 10/10/2014  . Chronic diastolic heart failure ([place]) 05/31/2014  . HTN (hypertension) 05/31/2014  . DM (diabetes mellitus), type 2, uncontrolled ([place]) 05/31/2014    Alanson Puls, PT DPT 12/02/2017, 2:02 PM  [place] MAIN [facility] SERVICES [address] [place], [place], [address] Phone: [phone]   Fax:  [phone]  Name: REIZY DUNLOW MRN: [id number] Date of Birth: [birth year]

## 2017-12-07 ENCOUNTER — Encounter: Payer: Self-pay | Admitting: Physical Therapy

## 2017-12-07 ENCOUNTER — Ambulatory Visit: Payer: PPO | Admitting: Physical Therapy

## 2017-12-07 ENCOUNTER — Encounter: Payer: PPO | Admitting: Physical Therapy

## 2017-12-07 DIAGNOSIS — R262 Difficulty in walking, not elsewhere classified: Secondary | ICD-10-CM

## 2017-12-07 DIAGNOSIS — M6281 Muscle weakness (generalized): Secondary | ICD-10-CM | POA: Diagnosis not present

## 2017-12-07 NOTE — Therapy (Signed)
[name] [facility] SERVICES [address] [place], [place], [address] Phone: [phone]   Fax:  [phone]  Physical Therapy Treatment  Patient Details  Name: Sharon [name] MRN: [id number] Date of Birth: 11/23/1933 Referring Provider (PT): [place], [place]   Encounter Date: 12/07/2017  PT End of Session - 12/07/17 1433    Visit Number  3    Number of Visits  17    Date for PT Re-Evaluation  01/25/18    PT Start Time  2353    PT Stop Time  1515    PT Time Calculation (min)  40 min    Equipment Utilized During Treatment  Gait belt    Activity Tolerance  Patient tolerated treatment well    Behavior During Therapy  WFL for tasks assessed/performed       Past Medical History:  Diagnosis Date  . Anemia   . Atrial fibrillation ([place])   . Cardiac arrest ([place])   . Cataract   . CHF (congestive heart failure) ([place])   . Diabetes mellitus without complication ([place])   . Edema    feet/ankles occas  . Hip fracture ([place])   . Hyperlipidemia   . Hypertension   . Hypothyroid   . Neuropathy   . Osteomyelitis ([place])    left first metatarsal  . Stroke [facility])     Past Surgical History:  Procedure Laterality Date  . ACHILLES TENDON SURGERY Left 02/08/2015   Procedure: ACHILLES LENGTHENING/KIDNER;  Surgeon: Albertine Patricia, DPM;  Location: [facility];  Service: Podiatry;  Laterality: Left;  . APPENDECTOMY    . CARDIAC CATHETERIZATION  08/25/13  . CATARACT EXTRACTION W/PHACO Left 07/20/2014   Procedure: CATARACT EXTRACTION PHACO AND INTRAOCULAR LENS PLACEMENT (IOC);  Surgeon: Leandrew Koyanagi, MD;  Location: [facility];  Service: Ophthalmology;  Laterality: Left;  [place]  1:18                 AP     23.6             CDE   9.69      lot #[id number]  . CHOLECYSTECTOMY    . CORONARY ANGIOPLASTY    . HALLUX VALGUS AKIN Left 02/08/2015   Procedure: HALLUX VALGUS AKIN/ KELLER;  Surgeon: Albertine Patricia, DPM;  Location: [facility];  Service: Podiatry;   Laterality: Left;  IVA with Local needs 1 hour for this case   . HEMIARTHROPLASTY HIP Right   . HEMIARTHROPLASTY HIP Left   . INCISION AND DRAINAGE Left 10/11/2014   Procedure: Removal of infected tibial sessmoid;  Surgeon: Albertine Patricia, DPM;  Location: [facility];  Service: Podiatry;  Laterality: Left;  . IRRIGATION AND DEBRIDEMENT FOOT Right 03/21/2017   Procedure: IRRIGATION AND DEBRIDEMENT FOOT right great toe amputation;  Surgeon: Sharlotte Alamo, DPM;  Location: [facility];  Service: Podiatry;  Laterality: Right;  . IRRIGATION AND DEBRIDEMENT FOOT Right 04/18/2017   Procedure: IRRIGATION AND DEBRIDEMENT FOOT and application wound vac;  Surgeon: Albertine Patricia, DPM;  Location: [facility];  Service: Podiatry;  Laterality: Right;  . PERIPHERAL VASCULAR BALLOON ANGIOPLASTY Right 04/17/2017   Procedure: PERIPHERAL VASCULAR BALLOON ANGIOPLASTY;  Surgeon: Katha Cabal, MD;  Location: [place] [facility];  Service: Cardiovascular;  Laterality: Right;    There were no vitals filed for this visit.  Subjective Assessment - 12/07/17 1442    Subjective  Patient states that she cut /hit her leg getting into the w/c and the top layer of skin is gone.  Dr. Sabra Heck is managing the abrasion with oral antibiotics and wound care including clean dressings and ointment. Patient reports that the dressing was changed just prior to PT. Patient denies any falls or new significant changes since her last visit. Patient has new therapeutic shoes since her last visit and states they are working out okay. Patient is interested in OT for her hands; she was advised to contact her MD for a referral.    Pertinent History  Patient stopped walking in march. She had a fall at home and she went to the ER and she had infections in bilateral feet. she had her great toe R foot ampuated and she had a wound on L foot. She went to the hospital and then to [facility]. She went back to the MD and had a second surgery in March and then went to  another SNF for 3-4 months. She was not able to walk due to being non weight bearing. She was discharged to [facility] an assisted living facility. she had PT 2 x / week. She was able to walk short distances with RW.  She is able to dress herself, she takes herself to the bathroom with assist. She has had several falls.     Limitations  Walking    How long can you stand comfortably?  15 mins if she is holding on    Patient Stated Goals  She wants to walk again.     Currently in Pain?  Yes    Pain Score  7     Pain Location  Leg    Pain Orientation  Left    Pain Descriptors / Indicators  Tender    Pain Type  Acute pain    Multiple Pain Sites  No          TREATMENT  Therapeutic Exercise: Seated Marching x20 BLE Seated Clamshell x30 BLE Supine Clamshell x20 BLE, w/ YTB x15 BLE Supine hip adduction squeeze x15 x 5 sec hold Supine hip abduction 2x10 BLE Supine marching x15 BLE wc <> mat with min assist     PT Education - 12/07/17 1618    Education Details  log roll technique, body mechanics, exercise technique    Person(s) Educated  Patient    Methods  Explanation;Demonstration;Verbal cues;Tactile cues    Comprehension  Verbalized understanding;Returned demonstration;Need further instruction;Verbal cues required       PT Short Term Goals - 11/30/17 0943      PT SHORT TERM GOAL #1   Title  Patient will be independent in home exercise program to improve strength/mobility for better functional independence with ADLs.    Time  4    Period  Weeks    Status  New    Target Date  12/28/17      PT SHORT TERM GOAL #2   Title  Patient will transfer from wc to bed with SBA and RW.    Baseline  needs min assist     Time  4    Period  Weeks    Status  New    Target Date  12/28/17      PT SHORT TERM GOAL #3   Title  Patient will improve her 50 MW test from . 2 m/sec to . 5 m/sec with RW    Baseline  . 2 m/sec    Time  4    Period  Weeks    Status  New    Target Date  12/28/17  PT Long Term Goals - 11/30/17 0944      PT LONG TERM GOAL #1   Title  Patient will be able to perform 5 sit to stand transfers with RW using her hands to push up from a 17 inch chair    Baseline  performs sit to stand transfer with UE assist from 20 inch wc 5 x in 37 seconds    Time  8    Period  Weeks    Status  New    Target Date  01/25/18      PT LONG TERM GOAL #2   Title  Patient will be able to stand x 10 mins with SBA to be able to perform standing activiites at the counter    Baseline  patient is able to stand for 6  min    Time  8    Period  Weeks    Status  New    Target Date  01/25/18      PT LONG TERM GOAL #3   Title  Patient will be able to ambulate with RW and SBA  assist x 200 feet     Baseline  Ambulates with min assist and wc following 100 feet     Time  8    Period  Weeks    Status  New    Target Date  01/25/18      PT LONG TERM GOAL #4   Title  patient will impvoe BLE hip strength to 4/5 to improve mobility and safety    Baseline  2/5 hip abd and add,, 3/5 hip flex, unable to test hip ext    Time  8    Period  Weeks    Status  New    Target Date  01/25/18      PT LONG TERM GOAL #5   Title  Patient will improve static standing balance from poor plus to fair and be able to reach for objects on the counter top.    Baseline  poor plus static standing balance    Time  8    Period  Weeks    Status  New    Target Date  01/25/18            Plan - 12/07/17 1619    Clinical Impression Statement  Patient presents to clinic with a LLE wound on the anterior aspect of the lower leg that is being managed by MD, and is amenable to therapy. Patient had decreased tolerance to weightbearing through the LLE 2/2 to increased pain at the wound site. Patient able to perform supine and seated exercises with appropriate form after minimal VCs. Patient will continue to benefit from skilled therapeutic intervention to address deficits in strength, pain, and  mobility.     Rehab Potential  Fair    PT Frequency  2x / week    PT Duration  8 weeks    PT Treatment/Interventions  Aquatic Therapy;Gait training;Therapeutic exercise;Therapeutic activities;Functional mobility training;Balance training;Neuromuscular re-education;Patient/family education;Moist Heat    PT Next Visit Plan  LE strengthening, gait training    PT Home Exercise Plan  bridges, marching, hip abd/ER GTB, SLR    Consulted and Agree with Plan of Care  Patient;Other (Comment)   friend      Patient will benefit from skilled therapeutic intervention in order to improve the following deficits and impairments:  Abnormal gait, Decreased balance, Decreased endurance, Decreased mobility, Difficulty walking, Impaired UE functional use, Decreased strength, Decreased activity tolerance  Visit Diagnosis: Muscle weakness (generalized)  Difficulty in walking, not elsewhere classified     Problem List Patient Active Problem List   Diagnosis Date Noted  . Atherosclerosis of native arteries of the extremities with ulceration ([place]) 05/07/2017  . Non-healing ulcer ([place]) 04/16/2017  . Diabetic neuropathy ([place]) 04/07/2017  . Septic shock ([place]) 03/20/2017  . CKD (chronic kidney disease), stage III ([place]) 03/19/2017  . Goals of care, counseling/discussion 03/03/2017  . PAD (peripheral artery disease) ([place]) 02/03/2017  . Abdominal aortic stenosis 02/03/2017  . Bilateral lower extremity edema 02/03/2017  . Anemia 01/29/2017  . B12 deficiency 01/29/2017  . Pancreatic mass 01/29/2017  . Hyponatremia 07/07/2015  . Foot ulcer ([place]) 02/07/2015  . ARF (acute renal failure) ([place]) 11/05/2014  . Diabetic foot infection ([place]) 10/10/2014  . Chronic diastolic heart failure ([place]) 05/31/2014  . HTN (hypertension) 05/31/2014  . DM (diabetes mellitus), type 2, uncontrolled ([place]) 05/31/2014   Myles Gip PT, DPT [phone] 12/07/2017, 4:25 PM  [place] MAIN [facility]  SERVICES [address] [place], [place], [address] Phone: [phone]   Fax:  [phone]  Name: CHRISTANA ANGELICA MRN: [id number] Date of Birth: 09/14/1933

## 2017-12-16 ENCOUNTER — Encounter: Payer: PPO | Admitting: Physical Therapy

## 2017-12-17 ENCOUNTER — Ambulatory Visit: Payer: PPO | Attending: Podiatry | Admitting: Physical Therapy

## 2017-12-17 ENCOUNTER — Encounter: Payer: Self-pay | Admitting: Physical Therapy

## 2017-12-17 DIAGNOSIS — M6281 Muscle weakness (generalized): Secondary | ICD-10-CM | POA: Insufficient documentation

## 2017-12-17 DIAGNOSIS — R262 Difficulty in walking, not elsewhere classified: Secondary | ICD-10-CM

## 2017-12-17 DIAGNOSIS — R278 Other lack of coordination: Secondary | ICD-10-CM | POA: Insufficient documentation

## 2017-12-17 NOTE — Therapy (Signed)
[place] MAIN [facility] SERVICES [address] [place], [place], [address] Phone: [phone]   Fax:  [phone]  Physical Therapy Treatment  Patient Details  Name: Sharon [name] MRN: [id number] Date of Birth: [birth year] Referring Provider (PT): [place], [place]   Encounter Date: 12/17/2017  PT End of Session - 12/17/17 1621    Visit Number  4    Number of Visits  17    Date for PT Re-Evaluation  01/25/18    Authorization Type  4/10    PT Start Time  0405    PT Stop Time  0445    PT Time Calculation (min)  40 min    Equipment Utilized During Treatment  Gait belt    Activity Tolerance  Patient tolerated treatment well    Behavior During Therapy  WFL for tasks assessed/performed       Past Medical History:  Diagnosis Date  . Anemia   . Atrial fibrillation ([place])   . Cardiac arrest ([place])   . Cataract   . CHF (congestive heart failure) ([place])   . Diabetes mellitus without complication ([place])   . Edema    feet/ankles occas  . Hip fracture ([place])   . Hyperlipidemia   . Hypertension   . Hypothyroid   . Neuropathy   . Osteomyelitis ([place])    left first metatarsal  . Stroke [facility])     Past Surgical History:  Procedure Laterality Date  . ACHILLES TENDON SURGERY Left 02/08/2015   Procedure: ACHILLES LENGTHENING/KIDNER;  Surgeon: Albertine Patricia, DPM;  Location: [facility] ORS;  Service: Podiatry;  Laterality: Left;  . APPENDECTOMY    . CARDIAC CATHETERIZATION  08/25/13  . CATARACT EXTRACTION W/PHACO Left 07/20/2014   Procedure: CATARACT EXTRACTION PHACO AND INTRAOCULAR LENS PLACEMENT (IOC);  Surgeon: Leandrew Koyanagi, MD;  Location: [facility] ORS;  Service: Ophthalmology;  Laterality: Left;  [place]  1:18                 AP     23.6             CDE   9.69      lot #[id number]  . CHOLECYSTECTOMY    . CORONARY ANGIOPLASTY    . HALLUX VALGUS AKIN Left 02/08/2015   Procedure: HALLUX VALGUS AKIN/ KELLER;  Surgeon: Albertine Patricia, DPM;  Location: [facility] ORS;   Service: Podiatry;  Laterality: Left;  IVA with Local needs 1 hour for this case   . HEMIARTHROPLASTY HIP Right   . HEMIARTHROPLASTY HIP Left   . INCISION AND DRAINAGE Left 10/11/2014   Procedure: Removal of infected tibial sessmoid;  Surgeon: Albertine Patricia, DPM;  Location: [facility] ORS;  Service: Podiatry;  Laterality: Left;  . IRRIGATION AND DEBRIDEMENT FOOT Right 03/21/2017   Procedure: IRRIGATION AND DEBRIDEMENT FOOT right great toe amputation;  Surgeon: Sharlotte Alamo, DPM;  Location: [facility] ORS;  Service: Podiatry;  Laterality: Right;  . IRRIGATION AND DEBRIDEMENT FOOT Right 04/18/2017   Procedure: IRRIGATION AND DEBRIDEMENT FOOT and application wound vac;  Surgeon: Albertine Patricia, DPM;  Location: [facility] ORS;  Service: Podiatry;  Laterality: Right;  . PERIPHERAL VASCULAR BALLOON ANGIOPLASTY Right 04/17/2017   Procedure: PERIPHERAL VASCULAR BALLOON ANGIOPLASTY;  Surgeon: Katha Cabal, MD;  Location: [place] [facility];  Service: Cardiovascular;  Laterality: Right;    There were no vitals filed for this visit.  Subjective Assessment - 12/17/17 1620    Subjective  Patient states that she cut /hit her leg getting into the w/c and  the top layer of skin is gone. Dr. Sabra Heck is managing the abrasion with oral antibiotics and wound care including clean dressings and ointment. Patient reports that the dressing was changed just prior to PT. Patient denies any falls or new significant changes since her last visit    Pertinent History  Patient stopped walking in march. She had a fall at home and she went to the ER and she had infections in bilateral feet. she had her great toe R foot ampuated and she had a wound on L foot. She went to the hospital and then to [facility]. She went back to the MD and had a second surgery in March and then went to another SNF for 3-4 months. She was not able to walk due to being non weight bearing. She was discharged to [facility] an assisted living facility. she had PT 2 x /  week. She was able to walk short distances with RW.  She is able to dress herself, she takes herself to the bathroom with assist. She has had several falls.     Limitations  Walking    How long can you stand comfortably?  15 mins if she is holding on    Patient Stated Goals  She wants to walk again.     Currently in Pain?  No/denies    Pain Score  0-No pain       TREATMENT Warm up nu-step x 5 mins   Therapeutic Exercise: Seated Marching x20 BLE Seated Clamshell x30 BLE Supine Clamshell x20 BLE, w/ YTB x15 BLE Supine hip adduction squeeze x15 x 5 sec hold Supine hip abduction 2x10 BLE Supine marching x15 BLE wc <> mat with min assist hooklying marching x 20 , 3 lbs  Hooklying hip abd/ER, YTB x 20  SLR x 10 left and right 9left stronger than right ) Patient reports some pain with RLE during hooklying marching with 3 lbs and weight was removed                        PT Education - 12/17/17 1621    Education Details  HEP    Person(s) Educated  Patient    Methods  Explanation    Comprehension  Verbalized understanding       PT Short Term Goals - 11/30/17 0943      PT SHORT TERM GOAL #1   Title  Patient will be independent in home exercise program to improve strength/mobility for better functional independence with ADLs.    Time  4    Period  Weeks    Status  New    Target Date  12/28/17      PT SHORT TERM GOAL #2   Title  Patient will transfer from wc to bed with SBA and RW.    Baseline  needs min assist     Time  4    Period  Weeks    Status  New    Target Date  12/28/17      PT SHORT TERM GOAL #3   Title  Patient will improve her 79 MW test from . 2 m/sec to . 5 m/sec with RW    Baseline  . 2 m/sec    Time  4    Period  Weeks    Status  New    Target Date  12/28/17        PT Long Term Goals - 11/30/17 [phone]  PT LONG TERM GOAL #1   Title  Patient will be able to perform 5 sit to stand transfers with RW using her hands to push up  from a 17 inch chair    Baseline  performs sit to stand transfer with UE assist from 20 inch wc 5 x in 37 seconds    Time  8    Period  Weeks    Status  New    Target Date  01/25/18      PT LONG TERM GOAL #2   Title  Patient will be able to stand x 10 mins with SBA to be able to perform standing activiites at the counter    Baseline  patient is able to stand for 6  min    Time  8    Period  Weeks    Status  New    Target Date  01/25/18      PT LONG TERM GOAL #3   Title  Patient will be able to ambulate with RW and SBA  assist x 200 feet     Baseline  Ambulates with min assist and wc following 100 feet     Time  8    Period  Weeks    Status  New    Target Date  01/25/18      PT LONG TERM GOAL #4   Title  patient will impvoe BLE hip strength to 4/5 to improve mobility and safety    Baseline  2/5 hip abd and add,, 3/5 hip flex, unable to test hip ext    Time  8    Period  Weeks    Status  New    Target Date  01/25/18      PT LONG TERM GOAL #5   Title  Patient will improve static standing balance from poor plus to fair and be able to reach for objects on the counter top.    Baseline  poor plus static standing balance    Time  8    Period  Weeks    Status  New    Target Date  01/25/18            Plan - 12/17/17 1623    Clinical Impression Statement  Pt requires direction and verbal cues for correct performance of exercises. Patient demonstrates weakness in BLE and performs open and closed chain exercises Pt was able to perform all exercises with min assist and VC for technique. Patient is transferring sit to stand with assist from back of the mat table for stability.  Pt encouraged continuing new supine HEP .Follow-up as scheduled.    Rehab Potential  Fair    PT Frequency  2x / week    PT Duration  8 weeks    PT Treatment/Interventions  Aquatic Therapy;Gait training;Therapeutic exercise;Therapeutic activities;Functional mobility training;Balance training;Neuromuscular  re-education;Patient/family education;Moist Heat    PT Next Visit Plan  LE strengthening, gait training    PT Home Exercise Plan  bridges, marching, hip abd/ER GTB, SLR    Consulted and Agree with Plan of Care  Patient;Other (Comment)   friend      Patient will benefit from skilled therapeutic intervention in order to improve the following deficits and impairments:  Abnormal gait, Decreased balance, Decreased endurance, Decreased mobility, Difficulty walking, Impaired UE functional use, Decreased strength, Decreased activity tolerance  Visit Diagnosis: Muscle weakness (generalized)  Difficulty in walking, not elsewhere classified     Problem List Patient Active Problem List  Diagnosis Date Noted  . Atherosclerosis of native arteries of the extremities with ulceration ([place]) 05/07/2017  . Non-healing ulcer ([place]) 04/16/2017  . Diabetic neuropathy ([place]) 04/07/2017  . Septic shock ([place]) 03/20/2017  . CKD (chronic kidney disease), stage III ([place]) 03/19/2017  . Goals of care, counseling/discussion 03/03/2017  . PAD (peripheral artery disease) ([place]) 02/03/2017  . Abdominal aortic stenosis 02/03/2017  . Bilateral lower extremity edema 02/03/2017  . Anemia 01/29/2017  . B12 deficiency 01/29/2017  . Pancreatic mass 01/29/2017  . Hyponatremia 07/07/2015  . Foot ulcer ([place]) 02/07/2015  . ARF (acute renal failure) ([place]) 11/05/2014  . Diabetic foot infection ([place]) 10/10/2014  . Chronic diastolic heart failure ([place]) 05/31/2014  . HTN (hypertension) 05/31/2014  . DM (diabetes mellitus), type 2, uncontrolled ([place]) 05/31/2014    Alanson Puls, [place] DPT 12/17/2017, 4:25 PM  [place] MAIN [facility] SERVICES [address] [place], [place], [address] Phone: [phone]   Fax:  [phone]  Name: JOYELL EMAMI MRN: [id number] Date of Birth: 18-Oct-1933

## 2017-12-21 DIAGNOSIS — R600 Localized edema: Secondary | ICD-10-CM | POA: Diagnosis not present

## 2017-12-22 ENCOUNTER — Encounter: Payer: Self-pay | Admitting: Physical Therapy

## 2017-12-22 ENCOUNTER — Ambulatory Visit: Payer: PPO | Admitting: Physical Therapy

## 2017-12-22 ENCOUNTER — Encounter: Payer: PPO | Admitting: Physical Therapy

## 2017-12-22 DIAGNOSIS — M6281 Muscle weakness (generalized): Secondary | ICD-10-CM

## 2017-12-22 DIAGNOSIS — R262 Difficulty in walking, not elsewhere classified: Secondary | ICD-10-CM

## 2017-12-22 NOTE — Therapy (Signed)
[place] MAIN [facility] SERVICES [address] [place], [place], [address] Phone: [phone]   Fax:  [phone]  Physical Therapy Treatment  Patient Details  Name: Sharon [name] MRN: [id number] Date of Birth: 06-13-1933 Referring Provider (PT): [place], [place]   Encounter Date: 12/22/2017  PT End of Session - 12/22/17 1133    Visit Number  5    Number of Visits  17    Date for PT Re-Evaluation  01/25/18    Authorization Type  5/10    PT Start Time  1117    PT Stop Time  1200    PT Time Calculation (min)  43 min    Equipment Utilized During Treatment  Gait belt    Activity Tolerance  Patient tolerated treatment well    Behavior During Therapy  WFL for tasks assessed/performed       Past Medical History:  Diagnosis Date  . Anemia   . Atrial fibrillation ([place])   . Cardiac arrest ([place])   . Cataract   . CHF (congestive heart failure) ([place])   . Diabetes mellitus without complication ([place])   . Edema    feet/ankles occas  . Hip fracture ([place])   . Hyperlipidemia   . Hypertension   . Hypothyroid   . Neuropathy   . Osteomyelitis ([place])    left first metatarsal  . Stroke [facility])     Past Surgical History:  Procedure Laterality Date  . ACHILLES TENDON SURGERY Left 02/08/2015   Procedure: ACHILLES LENGTHENING/KIDNER;  Surgeon: Albertine Patricia, DPM;  Location: [facility] ORS;  Service: Podiatry;  Laterality: Left;  . APPENDECTOMY    . CARDIAC CATHETERIZATION  08/25/13  . CATARACT EXTRACTION W/PHACO Left 07/20/2014   Procedure: CATARACT EXTRACTION PHACO AND INTRAOCULAR LENS PLACEMENT (IOC);  Surgeon: Leandrew Koyanagi, MD;  Location: [facility] ORS;  Service: Ophthalmology;  Laterality: Left;  [place]  1:18                 AP     23.6             CDE   9.69      lot #[id number]  . CHOLECYSTECTOMY    . CORONARY ANGIOPLASTY    . HALLUX VALGUS AKIN Left 02/08/2015   Procedure: HALLUX VALGUS AKIN/ KELLER;  Surgeon: Albertine Patricia, DPM;  Location: [facility] ORS;   Service: Podiatry;  Laterality: Left;  IVA with Local needs 1 hour for this case   . HEMIARTHROPLASTY HIP Right   . HEMIARTHROPLASTY HIP Left   . INCISION AND DRAINAGE Left 10/11/2014   Procedure: Removal of infected tibial sessmoid;  Surgeon: Albertine Patricia, DPM;  Location: [facility] ORS;  Service: Podiatry;  Laterality: Left;  . IRRIGATION AND DEBRIDEMENT FOOT Right 03/21/2017   Procedure: IRRIGATION AND DEBRIDEMENT FOOT right great toe amputation;  Surgeon: Sharlotte Alamo, DPM;  Location: [facility] ORS;  Service: Podiatry;  Laterality: Right;  . IRRIGATION AND DEBRIDEMENT FOOT Right 04/18/2017   Procedure: IRRIGATION AND DEBRIDEMENT FOOT and application wound vac;  Surgeon: Albertine Patricia, DPM;  Location: [facility] ORS;  Service: Podiatry;  Laterality: Right;  . PERIPHERAL VASCULAR BALLOON ANGIOPLASTY Right 04/17/2017   Procedure: PERIPHERAL VASCULAR BALLOON ANGIOPLASTY;  Surgeon: Katha Cabal, MD;  Location: [place] CV LAB;  Service: Cardiovascular;  Laterality: Right;    There were no vitals filed for this visit.  Subjective Assessment - 12/22/17 1123    Subjective  Patient reports that she is unable to do her exercise at her facility  because she doesn't have any help; She reports getting up/down out of wheelchair and stands at bar in her bathroom; She reports that she will take a few steps with her walker; Pt has some swelling in right leg which has been present for several weeks. She has had her leg checked out by physician and he says that its okay;     Pertinent History  Patient stopped walking in march. She had a fall at home and she went to the ER and she had infections in bilateral feet. she had her great toe R foot ampuated and she had a wound on L foot. She went to the hospital and then to [facility]. She went back to the MD and had a second surgery in March and then went to another SNF for 3-4 months. She was not able to walk due to being non weight bearing. She was discharged to [facility]  an assisted living facility. she had PT 2 x / week. She was able to walk short distances with RW.  She is able to dress herself, she takes herself to the bathroom with assist. She has had several falls.     Limitations  Walking    How long can you stand comfortably?  15 mins if she is holding on    Patient Stated Goals  She wants to walk again.     Currently in Pain?  No/denies    Multiple Pain Sites  No             TREATMENT Warm up nu-step x 5 mins   Therapeutic Exercise: seated with yellow tband Seated Marchingx15 BLE Seated Clamshellx15 BLE Seated LAQ with yellow tband x15 bilaterally; Seated ankle DF with yellow tband x15 bilaterally Patient required min-moderate verbal/tactile cues for correct exercise technique including cues to increase ROM for better strengthening and to improve erect posture for better exercise technique;    Standing in parallel bars: -dynamic reversals in staggered stance to challenge hip extension during loading phase of gait x5 reps with manual therapist resistance x1 set each LE in front; patient required mod VCs for correct positioning and to improve erect posture and hip/knee extension in stance; Patient often buckles RLE with poor quad control;  Standing: Heel raises x15 with B rail assist with mod VCs to keep knee extended for better calf/ankle strengthening;  Instructed patient in gait on level surface x40 feet with RW with cues to improve hip/knee extension on RLE at initial contact into mid stance; Patient has decreased knee control with increased knee flexion in mid stance/loading; Patient exhibits decreased step length on LLE due to poor knee control on right and decreased weight shift; Patient would benefit from additional strengthening prior to standing/walking due to excessive weakness in LE;                  PT Education - 12/22/17 1126    Education Details  HEP reinforced, strengthening;     Person(s) Educated   Patient    Methods  Explanation;Verbal cues    Comprehension  Verbalized understanding;Returned demonstration;Verbal cues required;Need further instruction       PT Short Term Goals - 11/30/17 0943      PT SHORT TERM GOAL #1   Title  Patient will be independent in home exercise program to improve strength/mobility for better functional independence with ADLs.    Time  4    Period  Weeks    Status  New    Target Date  12/28/17      PT SHORT TERM GOAL #2   Title  Patient will transfer from wc to bed with SBA and RW.    Baseline  needs min assist     Time  4    Period  Weeks    Status  New    Target Date  12/28/17      PT SHORT TERM GOAL #3   Title  Patient will improve her 63 MW test from . 2 m/sec to . 5 m/sec with RW    Baseline  . 2 m/sec    Time  4    Period  Weeks    Status  New    Target Date  12/28/17        PT Long Term Goals - 11/30/17 0944      PT LONG TERM GOAL #1   Title  Patient will be able to perform 5 sit to stand transfers with RW using her hands to push up from a 17 inch chair    Baseline  performs sit to stand transfer with UE assist from 20 inch wc 5 x in 37 seconds    Time  8    Period  Weeks    Status  New    Target Date  01/25/18      PT LONG TERM GOAL #2   Title  Patient will be able to stand x 10 mins with SBA to be able to perform standing activiites at the counter    Baseline  patient is able to stand for 6  min    Time  8    Period  Weeks    Status  New    Target Date  01/25/18      PT LONG TERM GOAL #3   Title  Patient will be able to ambulate with RW and SBA  assist x 200 feet     Baseline  Ambulates with min assist and wc following 100 feet     Time  8    Period  Weeks    Status  New    Target Date  01/25/18      PT LONG TERM GOAL #4   Title  patient will impvoe BLE hip strength to 4/5 to improve mobility and safety    Baseline  2/5 hip abd and add,, 3/5 hip flex, unable to test hip ext    Time  8    Period  Weeks    Status   New    Target Date  01/25/18      PT LONG TERM GOAL #5   Title  Patient will improve static standing balance from poor plus to fair and be able to reach for objects on the counter top.    Baseline  poor plus static standing balance    Time  8    Period  Weeks    Status  New    Target Date  01/25/18            Plan - 12/22/17 1404    Clinical Impression Statement  Patient instructed in advanced LE strengthening; she expressed desire to do more in standing and walking. PT identified increased weakness in BLE (R>L) with increased buckling in RLE during mid stance which makes gait unsafe; Recommend patient increase strength prior to additional prolonged standing/walking; PT reinforced HEP with instruction to increase LAQ/quad activation for better strength in stance; Patient would benefit from additional skilled PT intervention to improve strength,  balance and gait safety;     Rehab Potential  Fair    PT Frequency  2x / week    PT Duration  8 weeks    PT Treatment/Interventions  Aquatic Therapy;Gait training;Therapeutic exercise;Therapeutic activities;Functional mobility training;Balance training;Neuromuscular re-education;Patient/family education;Moist Heat    PT Next Visit Plan  LE strengthening, gait training    PT Home Exercise Plan  bridges, marching, hip abd/ER GTB, SLR    Consulted and Agree with Plan of Care  Patient;Other (Comment)   friend      Patient will benefit from skilled therapeutic intervention in order to improve the following deficits and impairments:  Abnormal gait, Decreased balance, Decreased endurance, Decreased mobility, Difficulty walking, Impaired UE functional use, Decreased strength, Decreased activity tolerance  Visit Diagnosis: Muscle weakness (generalized)  Difficulty in walking, not elsewhere classified     Problem List Patient Active Problem List   Diagnosis Date Noted  . Atherosclerosis of native arteries of the extremities with ulceration  ([place]) 05/07/2017  . Non-healing ulcer ([place]) 04/16/2017  . Diabetic neuropathy ([place]) 04/07/2017  . Septic shock ([place]) 03/20/2017  . CKD (chronic kidney disease), stage III ([place]) 03/19/2017  . Goals of care, counseling/discussion 03/03/2017  . PAD (peripheral artery disease) ([place]) 02/03/2017  . Abdominal aortic stenosis 02/03/2017  . Bilateral lower extremity edema 02/03/2017  . Anemia 01/29/2017  . B12 deficiency 01/29/2017  . Pancreatic mass 01/29/2017  . Hyponatremia 07/07/2015  . Foot ulcer ([place]) 02/07/2015  . ARF (acute renal failure) ([place]) 11/05/2014  . Diabetic foot infection ([place]) 10/10/2014  . Chronic diastolic heart failure ([place]) 05/31/2014  . HTN (hypertension) 05/31/2014  . DM (diabetes mellitus), type 2, uncontrolled ([place]) 05/31/2014    Trotter,Margaret PT, DPT 12/22/2017, 2:06 PM  [place] MAIN [facility] SERVICES [address] [place], [place], [address] Phone: [phone]   Fax:  [phone]  Name: Sharon [name] MRN: [id number] Date of Birth: 1933-08-20

## 2017-12-24 ENCOUNTER — Ambulatory Visit: Payer: PPO | Admitting: Occupational Therapy

## 2017-12-24 ENCOUNTER — Ambulatory Visit: Payer: PPO | Admitting: Physical Therapy

## 2017-12-24 ENCOUNTER — Encounter: Payer: PPO | Admitting: Physical Therapy

## 2017-12-28 ENCOUNTER — Encounter: Payer: Self-pay | Admitting: Physical Therapy

## 2017-12-28 ENCOUNTER — Ambulatory Visit: Payer: PPO | Admitting: Physical Therapy

## 2017-12-28 DIAGNOSIS — M6281 Muscle weakness (generalized): Secondary | ICD-10-CM | POA: Diagnosis not present

## 2017-12-28 DIAGNOSIS — R262 Difficulty in walking, not elsewhere classified: Secondary | ICD-10-CM

## 2017-12-28 NOTE — Patient Instructions (Signed)
Sitting Chair Flexion    Bring knee up toward chest.  Repeat with other leg. Repeat _15___ times. Do _2___ sessions per day.  http://gt2.exer.us/392   Copyright  VHI. All rights reserved.  Sitting Chair Flexion  Sitting Extension    Straighten knee as far as possible. Slowly bend it slightly, and then bring it back up to full extension. Repeat with other leg. Repeat _15___ times. Do _2___ sessions per day.  http://gt2.exer.us/712   Copyright  VHI. All rights reserved.   r.  http://gt2.exer.us/392   Copyright  VHI. All rights reserved.  Bridge    Lie back, legs bent. Inhale, pressing hips up. Keeping ribs in, lengthen lower back. Exhale, rolling down along spine from top. Repeat __15__ times. Do __2__ sessions per day.  http://pm.exer.us/55   Copyright  VHI. All rights reserved.  HIP: Abduction - Side-Lying    Lie on side, legs straight and in line with trunk. Squeeze glutes. Raise top leg up and slightly back. Point toes forward. __15_ reps per set, __2_ sets per day, __7_ days per week Bend bottom leg to stabilize pelvis.  Copyright  VHI. All rights reserved.  HIP: Abduction / External Rotation - Side-Lying    Lie on side with hip and knees bent. Raise top knee up, squeezing glutes. Keep ankles together. 15___ reps per set, __2_ sets per day, __7_ days per week   Copyright  VHI. All rights reserved.

## 2017-12-28 NOTE — Therapy (Signed)
[place] MAIN [facility] SERVICES [address] [place], [place], [address] Phone: [phone]   Fax:  [phone]  Physical Therapy Treatment  Patient Details  Name: Sharon [name] MRN: [id number] Date of Birth: [birth year] Referring Provider (PT): [place], [place]   Encounter Date: 12/28/2017  PT End of Session - 12/28/17 1336    Visit Number  6    Number of Visits  17    Date for PT Re-Evaluation  01/25/18    Authorization Type  6/10    PT Start Time  0105    PT Stop Time  0145    PT Time Calculation (min)  40 min    Equipment Utilized During Treatment  Gait belt    Activity Tolerance  Patient tolerated treatment well    Behavior During Therapy  WFL for tasks assessed/performed       Past Medical History:  Diagnosis Date  . Anemia   . Atrial fibrillation ([place])   . Cardiac arrest ([place])   . Cataract   . CHF (congestive heart failure) ([place])   . Diabetes mellitus without complication ([place])   . Edema    feet/ankles occas  . Hip fracture ([place])   . Hyperlipidemia   . Hypertension   . Hypothyroid   . Neuropathy   . Osteomyelitis ([place])    left first metatarsal  . Stroke [facility])     Past Surgical History:  Procedure Laterality Date  . ACHILLES TENDON SURGERY Left 02/08/2015   Procedure: ACHILLES LENGTHENING/KIDNER;  Surgeon: Albertine Patricia, DPM;  Location: [facility] ORS;  Service: Podiatry;  Laterality: Left;  . APPENDECTOMY    . CARDIAC CATHETERIZATION  08/25/13  . CATARACT EXTRACTION W/PHACO Left 07/20/2014   Procedure: CATARACT EXTRACTION PHACO AND INTRAOCULAR LENS PLACEMENT (IOC);  Surgeon: Leandrew Koyanagi, MD;  Location: [facility] ORS;  Service: Ophthalmology;  Laterality: Left;  [place]  1:18                 AP     23.6             CDE   9.69      lot #[id number]  . CHOLECYSTECTOMY    . CORONARY ANGIOPLASTY    . HALLUX VALGUS AKIN Left 02/08/2015   Procedure: HALLUX VALGUS AKIN/ KELLER;  Surgeon: Albertine Patricia, DPM;  Location: [facility] ORS;   Service: Podiatry;  Laterality: Left;  IVA with Local needs 1 hour for this case   . HEMIARTHROPLASTY HIP Right   . HEMIARTHROPLASTY HIP Left   . INCISION AND DRAINAGE Left 10/11/2014   Procedure: Removal of infected tibial sessmoid;  Surgeon: Albertine Patricia, DPM;  Location: [facility] ORS;  Service: Podiatry;  Laterality: Left;  . IRRIGATION AND DEBRIDEMENT FOOT Right 03/21/2017   Procedure: IRRIGATION AND DEBRIDEMENT FOOT right great toe amputation;  Surgeon: Sharlotte Alamo, DPM;  Location: [facility] ORS;  Service: Podiatry;  Laterality: Right;  . IRRIGATION AND DEBRIDEMENT FOOT Right 04/18/2017   Procedure: IRRIGATION AND DEBRIDEMENT FOOT and application wound vac;  Surgeon: Albertine Patricia, DPM;  Location: [facility] ORS;  Service: Podiatry;  Laterality: Right;  . PERIPHERAL VASCULAR BALLOON ANGIOPLASTY Right 04/17/2017   Procedure: PERIPHERAL VASCULAR BALLOON ANGIOPLASTY;  Surgeon: Katha Cabal, MD;  Location: [place] [facility];  Service: Cardiovascular;  Laterality: Right;    There were no vitals filed for this visit.  Subjective Assessment - 12/28/17 1335    Subjective  Patient reports that she is unable to do her exercise at her facility  because she doesn't have any help; She reports getting up/down out of wheelchair and stands at bar in her bathroom; She reports that she will take a few steps with her walker; Pt has some swelling in right leg which has been present for several weeks. She has had her leg checked out by physician and he says that its okay;     Pertinent History  Patient stopped walking in march. She had a fall at home and she went to the ER and she had infections in bilateral feet. she had her great toe R foot ampuated and she had a wound on L foot. She went to the hospital and then to [facility]. She went back to the MD and had a second surgery in March and then went to another SNF for 3-4 months. She was not able to walk due to being non weight bearing. She was discharged to [facility]  an assisted living facility. she had PT 2 x / week. She was able to walk short distances with RW.  She is able to dress herself, she takes herself to the bathroom with assist. She has had several falls.     Limitations  Walking    How long can you stand comfortably?  15 mins if she is holding on    Patient Stated Goals  She wants to walk again.     Currently in Pain?  No/denies    Pain Score  0-No pain       Treatment: Hooklying abd/ER x 20  Hooklying marching x 20 Bridging  X 20  LAQ x 15 with 3 sec hold Seated marching x 15 x 2 sets  Gait training with RW 100 feet with poor posture, flexed knees, flexed hips Transfers sit to stand from edge of mat to stand with CGA x 5 reps Transfers wc <> mat x 15  CGA and Min to mod verbal cues used throughout . Continues to have balance deficits and strength deficits typical with diagnosis. Patient performs intermediate level exercises without pain behaviors and needs verbal cuing for correct positioning.                       PT Education - 12/28/17 1335    Education Details  HEP    Person(s) Educated  Patient    Methods  Explanation    Comprehension  Verbalized understanding       PT Short Term Goals - 11/30/17 0943      PT SHORT TERM GOAL #1   Title  Patient will be independent in home exercise program to improve strength/mobility for better functional independence with ADLs.    Time  4    Period  Weeks    Status  New    Target Date  12/28/17      PT SHORT TERM GOAL #2   Title  Patient will transfer from wc to bed with SBA and RW.    Baseline  needs min assist     Time  4    Period  Weeks    Status  New    Target Date  12/28/17      PT SHORT TERM GOAL #3   Title  Patient will improve her 36 MW test from . 2 m/sec to . 5 m/sec with RW    Baseline  . 2 m/sec    Time  4    Period  Weeks    Status  New  Target Date  12/28/17        PT Long Term Goals - 11/30/17 0944      PT LONG TERM GOAL #1    Title  Patient will be able to perform 5 sit to stand transfers with RW using her hands to push up from a 17 inch chair    Baseline  performs sit to stand transfer with UE assist from 20 inch wc 5 x in 37 seconds    Time  8    Period  Weeks    Status  New    Target Date  01/25/18      PT LONG TERM GOAL #2   Title  Patient will be able to stand x 10 mins with SBA to be able to perform standing activiites at the counter    Baseline  patient is able to stand for 6  min    Time  8    Period  Weeks    Status  New    Target Date  01/25/18      PT LONG TERM GOAL #3   Title  Patient will be able to ambulate with RW and SBA  assist x 200 feet     Baseline  Ambulates with min assist and wc following 100 feet     Time  8    Period  Weeks    Status  New    Target Date  01/25/18      PT LONG TERM GOAL #4   Title  patient will impvoe BLE hip strength to 4/5 to improve mobility and safety    Baseline  2/5 hip abd and add,, 3/5 hip flex, unable to test hip ext    Time  8    Period  Weeks    Status  New    Target Date  01/25/18      PT LONG TERM GOAL #5   Title  Patient will improve static standing balance from poor plus to fair and be able to reach for objects on the counter top.    Baseline  poor plus static standing balance    Time  8    Period  Weeks    Status  New    Target Date  01/25/18            Plan - 12/28/17 1341    Clinical Impression Statement  Patient performs transfer training sit to stand x 5 reps; gait training x 100 feet with RW and CGA with poor posture, flexed knees and flexed hips. She performs  supine exercises and is given handouts for HEP. She has poor strength and poor safety and will continue to benefit from skilled PT to improve strength and mobility.    Rehab Potential  Fair    PT Frequency  2x / week    PT Duration  8 weeks    PT Treatment/Interventions  Aquatic Therapy;Gait training;Therapeutic exercise;Therapeutic activities;Functional mobility  training;Balance training;Neuromuscular re-education;Patient/family education;Moist Heat    PT Next Visit Plan  LE strengthening, gait training    PT Home Exercise Plan  bridges, marching, hip abd/ER GTB, SLR    Consulted and Agree with Plan of Care  Patient;Other (Comment)   friend      Patient will benefit from skilled therapeutic intervention in order to improve the following deficits and impairments:  Abnormal gait, Decreased balance, Decreased endurance, Decreased mobility, Difficulty walking, Impaired UE functional use, Decreased strength, Decreased activity tolerance  Visit Diagnosis: Muscle weakness (generalized)  Difficulty in walking, not elsewhere classified     Problem List Patient Active Problem List   Diagnosis Date Noted  . Atherosclerosis of native arteries of the extremities with ulceration ([place]) 05/07/2017  . Non-healing ulcer ([place]) 04/16/2017  . Diabetic neuropathy ([place]) 04/07/2017  . Septic shock ([place]) 03/20/2017  . CKD (chronic kidney disease), stage III ([place]) 03/19/2017  . Goals of care, counseling/discussion 03/03/2017  . PAD (peripheral artery disease) ([place]) 02/03/2017  . Abdominal aortic stenosis 02/03/2017  . Bilateral lower extremity edema 02/03/2017  . Anemia 01/29/2017  . B12 deficiency 01/29/2017  . Pancreatic mass 01/29/2017  . Hyponatremia 07/07/2015  . Foot ulcer ([place]) 02/07/2015  . ARF (acute renal failure) ([place]) 11/05/2014  . Diabetic foot infection ([place]) 10/10/2014  . Chronic diastolic heart failure ([place]) 05/31/2014  . HTN (hypertension) 05/31/2014  . DM (diabetes mellitus), type 2, uncontrolled ([place]) 05/31/2014    Willem Klingensmith, Sherryl Barters , PT DPT [place] MAIN [facility] SERVICES [address] [place], [place], [address] Phone: [phone]   Fax:  [phone]  Name: NERISSA CONSTANTIN MRN: [id number] Date of Birth: 1933/01/29

## 2017-12-29 ENCOUNTER — Encounter: Payer: PPO | Admitting: Physical Therapy

## 2017-12-29 ENCOUNTER — Ambulatory Visit: Payer: PPO

## 2017-12-30 ENCOUNTER — Ambulatory Visit: Payer: PPO | Admitting: Occupational Therapy

## 2017-12-30 ENCOUNTER — Encounter: Payer: Self-pay | Admitting: Occupational Therapy

## 2017-12-30 ENCOUNTER — Encounter: Payer: Self-pay | Admitting: Physical Therapy

## 2017-12-30 ENCOUNTER — Ambulatory Visit: Payer: PPO | Admitting: Physical Therapy

## 2017-12-30 DIAGNOSIS — R262 Difficulty in walking, not elsewhere classified: Secondary | ICD-10-CM

## 2017-12-30 DIAGNOSIS — M6281 Muscle weakness (generalized): Secondary | ICD-10-CM

## 2017-12-30 DIAGNOSIS — R278 Other lack of coordination: Secondary | ICD-10-CM

## 2017-12-30 NOTE — Therapy (Signed)
[place] MAIN [facility] SERVICES [address] [place], [place], [address] Phone: [phone]   Fax:  [phone]  Physical Therapy Treatment  Patient Details  Name: Sharon [name] MRN: [id number] Date of Birth: February 19, 1933 Referring Provider (PT): [place], [place]   Encounter Date: 12/30/2017  PT End of Session - 12/30/17 1150    Visit Number  7    Number of Visits  17    Date for PT Re-Evaluation  01/25/18    Authorization Type  7/10    PT Start Time  1145    PT Stop Time  1230    PT Time Calculation (min)  45 min    Equipment Utilized During Treatment  Gait belt    Activity Tolerance  Patient tolerated treatment well    Behavior During Therapy  WFL for tasks assessed/performed       Past Medical History:  Diagnosis Date  . Anemia   . Atrial fibrillation ([place])   . Cardiac arrest ([place])   . Cataract   . CHF (congestive heart failure) ([place])   . Diabetes mellitus without complication ([place])   . Edema    feet/ankles occas  . Hip fracture ([place])   . Hyperlipidemia   . Hypertension   . Hypothyroid   . Neuropathy   . Osteomyelitis ([place])    left first metatarsal  . Stroke [facility])     Past Surgical History:  Procedure Laterality Date  . ACHILLES TENDON SURGERY Left 02/08/2015   Procedure: ACHILLES LENGTHENING/KIDNER;  Surgeon: Albertine Patricia, DPM;  Location: [facility] ORS;  Service: Podiatry;  Laterality: Left;  . APPENDECTOMY    . CARDIAC CATHETERIZATION  08/25/13  . CATARACT EXTRACTION W/PHACO Left 07/20/2014   Procedure: CATARACT EXTRACTION PHACO AND INTRAOCULAR LENS PLACEMENT (IOC);  Surgeon: Leandrew Koyanagi, MD;  Location: [facility] ORS;  Service: Ophthalmology;  Laterality: Left;  [place]  1:18                 AP     23.6             CDE   9.69      lot #[phone]  . CHOLECYSTECTOMY    . CORONARY ANGIOPLASTY    . HALLUX VALGUS AKIN Left 02/08/2015   Procedure: HALLUX VALGUS AKIN/ KELLER;  Surgeon: Albertine Patricia, DPM;  Location: [facility] ORS;   Service: Podiatry;  Laterality: Left;  IVA with Local needs 1 hour for this case   . HEMIARTHROPLASTY HIP Right   . HEMIARTHROPLASTY HIP Left   . INCISION AND DRAINAGE Left 10/11/2014   Procedure: Removal of infected tibial sessmoid;  Surgeon: Albertine Patricia, DPM;  Location: [facility] ORS;  Service: Podiatry;  Laterality: Left;  . IRRIGATION AND DEBRIDEMENT FOOT Right 03/21/2017   Procedure: IRRIGATION AND DEBRIDEMENT FOOT right great toe amputation;  Surgeon: Sharlotte Alamo, DPM;  Location: [facility] OR[facility];  Service: Podiatry;  Laterality: Right;  . IRRIGATION AND DEBRIDEMENT FOOT Right 04/18/2017   Procedure: IRRIGATION AND DEBRIDEMENT FOOT and application wound vac;  Surgeon: Albertine Patricia, DPM;  Location: [facility] OR[facility];  Service: Podiatry;  Laterality: Right;  . PERIPHERAL VASCULAR BALLOON ANGIOPLASTY Right 04/17/2017   Procedure: PERIPHERAL VASCULAR BALLOON ANGIOPLASTY;  Surgeon: Katha Cabal, MD;  Location: [place] CV LAB;  Service: Cardiovascular;  Laterality: Right;    There were no vitals filed for this visit.  Subjective Assessment - 12/30/17 1147    Subjective  Pt denies any new falls; reports doing HEP inconsistently; has not had any  new falls;     Pertinent History  Patient stopped walking in march. She had a fall at home and she went to the ER and she had infections in bilateral feet. she had her great toe R foot ampuated and she had a wound on L foot. She went to the hospital and then to [facility]. She went back to the [facility] and had a second surgery in March and then went to another SNF for 3-4 months. She was not able to walk due to being non weight bearing. She was discharged to [facility] an assisted living facility. she had PT 2 x / week. She was able to walk short distances with RW.  She is able to dress herself, she takes herself to the bathroom with assist. She has had several falls.     Limitations  Walking    How long can you stand comfortably?  15 mins if she is holding on     Patient Stated Goals  She wants to walk again.     Currently in Pain?  No/denies         TREATMENT: PT assessed goals, see below; Pt able to transfer sit<>Stand with RW, SBA and do a stand pivot transfer to mat table/bed with SBA       OUTCOME MEASURES: TEST Outcome(11/30/17) Outcome(12/30/17) Interpretation  5 times sit<>stand 37 sec with 20 inch which WC with cushion 37 sec with 20 inch which WC with cushion >60 yo, >15 sec indicates increased risk for falls  10 meter walk test 0.2 m/s with RW .20 m/s with RW <1.0 m/s indicates increased risk for falls; limited community ambulator; no change from initial eval;       Instructed patient in LE strengthening: Hooklying with yellow tband around BLE: Hip abduction/ER 2x15 Hip flexion march x15 Bridges with arms  By side x15 Patient required min-moderate verbal/tactile cues for correct exercise technique including cues to increase AROM and to improve positioning for better muscle activation; Patient does require min A to stabilize RLE during LLE movement due to weakness hip RLE hip and poor abductor control;           PT Education - 12/30/17 1147    Education Details  Progress towards goals, HEP reinforced    Person(s) Educated  Patient    Methods  Explanation;Verbal cues    Comprehension  Verbalized understanding;Returned demonstration;Need further instruction       PT Short Term Goals - 12/30/17 1152      PT SHORT TERM GOAL #1   Title  Patient will be independent in home exercise program to improve strength/mobility for better functional independence with ADLs.    Baseline  inconsistent    Time  4    Period  Weeks    Status  Partially Met    Target Date  12/28/17      PT SHORT TERM GOAL #2   Title  Patient will transfer from wc to bed with SBA and RW.    Baseline  needs min assist , able to complete stand pivot transfer with RW SBA    Time  4    Period  Weeks    Status  Achieved    Target Date  12/28/17       PT SHORT TERM GOAL #3   Title  Patient will improve her 35 MW test from . 2 m/sec to . 5 m/sec with RW    Baseline  . 2 m/sec, 12/30/17: 0.2 m/s  Time  4    Period  Weeks    Status  Not Met    Target Date  12/28/17        PT Long Term Goals - 12/30/17 1206      PT LONG TERM GOAL #1   Title  Patient will be able to perform 5 sit to stand transfers with RW using her hands to push up from a 17 inch chair    Baseline  performs sit to stand transfer with UE assist from 20 inch wc 5 x in 37 seconds    Time  8    Period  Weeks    Status  Not Met    Target Date  01/25/18      PT LONG TERM GOAL #2   Title  Patient will be able to stand x 10 mins with SBA to be able to perform standing activiites at the counter    Baseline  patient is able to stand for 6  min    Time  8    Period  Weeks    Status  Not Met    Target Date  01/25/18      PT LONG TERM GOAL #3   Title  Patient will be able to ambulate with RW and SBA  assist x 200 feet     Baseline  Ambulates with min assist and wc following 100 feet     Time  8    Period  Weeks    Status  Not Met    Target Date  01/25/18      PT LONG TERM GOAL #4   Title  patient will impvoe BLE hip strength to 4/5 to improve mobility and safety    Baseline  2/5 hip abd and add,, 3/5 hip flex, unable to test hip ext    Time  8    Period  Weeks    Status  Not Met    Target Date  01/25/18      PT LONG TERM GOAL #5   Title  Patient will improve static standing balance from poor plus to fair and be able to reach for objects on the counter top.    Baseline  poor plus static standing balance, poor plus standing balance 12/18    Time  8    Period  Weeks    Status  Not Met    Target Date  01/25/18            Plan - 12/30/17 1400    Clinical Impression Statement  Patient instructed in outcome measures to address goals. She exhibits no change in speed with sit<>Stand or gait speed. However she is able to transfer with RW with SBA which is an  improvement to initial evaluation. Patient does report adherence to HEP although unsure of accuracy as patient is often forgetful and confused when asked specifics about HEP; Patient would benefit from additional skilled PT Intervention to improve strength, balance and gait safety;     Rehab Potential  Fair    PT Frequency  2x / week    PT Duration  8 weeks    PT Treatment/Interventions  Aquatic Therapy;Gait training;Therapeutic exercise;Therapeutic activities;Functional mobility training;Balance training;Neuromuscular re-education;Patient/family education;Moist Heat    PT Next Visit Plan  LE strengthening, gait training    PT Home Exercise Plan  bridges, marching, hip abd/ER GTB, SLR    Consulted and Agree with Plan of Care  Patient;Other (Comment)   friend  Patient will benefit from skilled therapeutic intervention in order to improve the following deficits and impairments:  Abnormal gait, Decreased balance, Decreased endurance, Decreased mobility, Difficulty walking, Impaired UE functional use, Decreased strength, Decreased activity tolerance  Visit Diagnosis: Muscle weakness (generalized)  Difficulty in walking, not elsewhere classified     Problem List Patient Active Problem List   Diagnosis Date Noted  . Atherosclerosis of native arteries of the extremities with ulceration ([place]) 05/07/2017  . Non-healing ulcer ([place]) 04/16/2017  . Diabetic neuropathy ([place]) 04/07/2017  . Septic shock ([place]) 03/20/2017  . CKD (chronic kidney disease), stage III ([place]) 03/19/2017  . Goals of care, counseling/discussion 03/03/2017  . PAD (peripheral artery disease) ([place]) 02/03/2017  . Abdominal aortic stenosis 02/03/2017  . Bilateral lower extremity edema 02/03/2017  . Anemia 01/29/2017  . B12 deficiency 01/29/2017  . Pancreatic mass 01/29/2017  . Hyponatremia 07/07/2015  . Foot ulcer ([place]) 02/07/2015  . ARF (acute renal failure) ([place]) 11/05/2014  . Diabetic foot infection ([place]) 10/10/2014   . Chronic diastolic heart failure ([place]) 05/31/2014  . HTN (hypertension) 05/31/2014  . DM (diabetes mellitus), type 2, uncontrolled ([place]) 05/31/2014    Trotter,Margaret PT, DPT 12/30/2017, 2:05 PM  [place] MAIN [facility] SERVICES [address] [place], [place], [address] Phone: [phone]   Fax:  [phone]  Name: Sharon [name] MRN: [id number] Date of Birth: 06-Mar-1933

## 2017-12-31 ENCOUNTER — Encounter: Payer: PPO | Admitting: Physical Therapy

## 2017-12-31 ENCOUNTER — Encounter: Payer: PPO | Admitting: Occupational Therapy

## 2017-12-31 ENCOUNTER — Ambulatory Visit: Payer: PPO

## 2018-01-02 NOTE — Therapy (Signed)
[place] MAIN [facility] SERVICES [address] [place], [place], [address] Phone: [phone]   Fax:  [phone]  Occupational Therapy Evaluation  Patient Details  Name: Sharon [name] MRN: [id number] Date of Birth: [birth year] Referring Provider (OT): Emily Filbert   Encounter Date: 12/30/2017  OT End of Session - 01/02/18 0932    Visit Number  1    Number of Visits  24    Date for OT Re-Evaluation  03/24/18    Authorization Type  Medicare visit 1 of 10, reporting period starting 12/30/2017    OT Start Time  1100    OT Stop Time  1145    OT Time Calculation (min)  45 min    Activity Tolerance  Patient tolerated treatment well    Behavior During Therapy  [facility] for tasks assessed/performed       Past Medical History:  Diagnosis Date  . Anemia   . Atrial fibrillation ([place])   . Cardiac arrest ([place])   . Cataract   . CHF (congestive heart failure) ([place])   . Diabetes mellitus without complication ([place])   . Edema    feet/ankles occas  . Hip fracture ([place])   . Hyperlipidemia   . Hypertension   . Hypothyroid   . Neuropathy   . Osteomyelitis ([place])    left first metatarsal  . Stroke [facility])     Past Surgical History:  Procedure Laterality Date  . ACHILLES TENDON SURGERY Left 02/08/2015   Procedure: ACHILLES LENGTHENING/KIDNER;  Surgeon: Albertine Patricia, DPM;  Location: [facility] ORS;  Service: Podiatry;  Laterality: Left;  . APPENDECTOMY    . CARDIAC CATHETERIZATION  08/25/13  . CATARACT EXTRACTION W/PHACO Left 07/20/2014   Procedure: CATARACT EXTRACTION PHACO AND INTRAOCULAR LENS PLACEMENT (IOC);  Surgeon: Leandrew Koyanagi, MD;  Location: [facility] ORS;  Service: Ophthalmology;  Laterality: Left;  [place]  1:18                 AP     23.6             CDE   9.69      lot #[id number]  . CHOLECYSTECTOMY    . CORONARY ANGIOPLASTY    . HALLUX VALGUS AKIN Left 02/08/2015   Procedure: HALLUX VALGUS AKIN/ KELLER;  Surgeon: Albertine Patricia, DPM;  Location: [facility]  ORS;  Service: Podiatry;  Laterality: Left;  IVA with Local needs 1 hour for this case   . HEMIARTHROPLASTY HIP Right   . HEMIARTHROPLASTY HIP Left   . INCISION AND DRAINAGE Left 10/11/2014   Procedure: Removal of infected tibial sessmoid;  Surgeon: Albertine Patricia, DPM;  Location: [facility] ORS;  Service: Podiatry;  Laterality: Left;  . IRRIGATION AND DEBRIDEMENT FOOT Right 03/21/2017   Procedure: IRRIGATION AND DEBRIDEMENT FOOT right great toe amputation;  Surgeon: Sharlotte Alamo, DPM;  Location: [facility] ORS;  Service: Podiatry;  Laterality: Right;  . IRRIGATION AND DEBRIDEMENT FOOT Right 04/18/2017   Procedure: IRRIGATION AND DEBRIDEMENT FOOT and application wound vac;  Surgeon: Albertine Patricia, DPM;  Location: [facility] ORS;  Service: Podiatry;  Laterality: Right;  . PERIPHERAL VASCULAR BALLOON ANGIOPLASTY Right 04/17/2017   Procedure: PERIPHERAL VASCULAR BALLOON ANGIOPLASTY;  Surgeon: Katha Cabal, MD;  Location: [facility] CV LAB;  Service: Cardiovascular;  Laterality: Right;    There were no vitals filed for this visit.  Subjective Assessment - 01/02/18 0924    Subjective   Patient reports she is having trouble with her hands especially the right hand with  holding items, decreased writing, self feeding.     Pertinent History  Pt with an extensive history since March of this year with falls, infection in her feet, great toe of R foot amputated and has been in a skilled nursing facility for many of those months with recent transition to assistive living facility.  She did not ambulate for many months and recently able to walk short distances with an assistive device, she has had multiple falls. She complains of bilateral hand pain but especially on the right side with reports of difficulty holding items, weakness and stiffness which she reports developed suddenly a few weeks ago, she reports CT scan performed but no evidence of stroke. She does report history of arthritic changes in the hands    Patient  Stated Goals  Patient reports she would like to be able to get up and walk, use right hand for self care.  Be able to write again.    Currently in Pain?  No/denies    Pain Score  0-No pain        [facility] OT Assessment - 01/02/18 0926      Assessment   Medical Diagnosis  weakness    Referring Provider (OT)  Emily Filbert    Onset Date/Surgical Date  11/24/17    Hand Dominance  Right    Next MD Visit  11/30/17    Prior Therapy  yes      Precautions   Precautions  Fall      Restrictions   Weight Bearing Restrictions  No      Balance Screen   Has the patient fallen in the past 6 months  Yes    How many times?  1      [place] expects to be discharged to:  Private residence    Living Arrangements  Alone    Available Help at Discharge  Family    Type of [place] Access  Level entry    [place]  One level    Bathroom Astronomer;Door    Loss adjuster, chartered - manual;Shower seat;Grab bars - tub/shower;Toilet riser;Walker - 2 wheels;Cane - single point;Walker - 4 wheels    Lives With  Alone      Prior Function   Level of Independence  Needs assistance with ADLs;Needs assistance with homemaking;Needs assistance with gait;Needs assistance with transfers    Vocation  Retired    Leisure  play cards, TV      ADL   Eating/Feeding  Needs assist with cutting food    Grooming  Modified independent    Upper Body Bathing  Minimal assistance    Lower Body Bathing  Moderate assistance    Upper Body Dressing  Increased time;Needs assist for fasteners    Lower Body Dressing  Moderate assistance    Toilet Transfer  Minimal assistance    Toileting - Clothing Manipulation  Increased time;Minimal assistance    Where Assessed - Toileting Clothing Manipulationn  Minimal assistance    Toileting -  Hygiene  Increase time    ADL comments  Patient reports difficulty with handwriting, holding her  coffee cup, plate, self feeding with holding utensils and performing self care tasks. Patient is currently residing at [facility]      IADL   Prior Level of Function Shopping  assist for transportation    Shopping  Needs  to be accompanied on any shopping trip    Prior Level of Function Light Housekeeping  moderate assist from son    Light Housekeeping  Needs help with all home maintenance tasks    Prior Level of Function Meal Prep  picked up food for lunch and dinner, could get a snack if needed    Meal Prep  Able to complete simple cold meal and snack prep    Prior Level of Function Community Mobility  assist for transportation    [place] on family or friends for transportation    Prior Level of Function Medication Managment  independent and used pill organizer    Medication Management  Is not capable of dispensing or managing own medication    Prior Level of Function Financial Management  independent    Financial Management  Requires assistance      Mobility   Mobility Status  Needs assist    Mobility Status Comments  mostly uses wheelchair at [place], prior to hospitalization patient utilized Health visitor Expression   Dominant Hand  Right    Handwriting  25% legible      Vision - History   Baseline Vision  Wears glasses all the time    Additional Comments  cataracts in the past with removal       Cognition   Overall Cognitive Status  Within Functional Limits for tasks assessed      Sensation   Light Touch  Appears Intact      Coordination   Gross Motor Movements are Fluid and Coordinated  No    Fine Motor Movements are Fluid and Coordinated  No    9 Hole Peg Test  Right;Left    Right 9 Hole Peg Test  unable    Left 9 Hole Peg Test  38 secs.       AROM   Overall AROM   Deficits    Overall AROM Comments  RUE shoulder flexion 95 degrees, left 120 degrees, elbow flexion/ext WFLs, slow to move wrist for flexion.extension,  partial fisting of right hand, increased effort with MF and RF with extension.        Strength   Overall Strength  Deficits    Overall Strength Comments  RUE shoulder 3-/5, elbow 3/5, wrist 3-/5      Hand Function   Right Hand Grip (lbs)  1    Right Hand Lateral Pinch  3 lbs    Right Hand 3 Point Pinch  3 lbs    Left Hand Grip (lbs)  21    Left Hand Lateral Pinch  4 lbs    Left 3 point pinch  6 lbs        Right digits ROM for extension -45 degrees MP extension MF, RF -50 MF PIP -45 RF PIP -20 MF DIP -20 RF DIP  Opposition of thumb to index finger and middle finger only               OT Education - 01/02/18 0932    Education Details  OT role, POC, goals    Person(s) Educated  Patient    Methods  Explanation    Comprehension  Verbalized understanding          OT Long Term Goals - 01/02/18 0943      OT LONG TERM GOAL #1   Title  Patient will demonstrate increase in grip strength by 5# to be able to hold utensils in  right hand for self feeding.     Baseline  currently using left hand to feed self     Time  6    Period  Weeks    Status  New    Target Date  02/10/18      OT LONG TERM GOAL #2   Title  Patient will demonstrate improved coordination to pick up small objects in right hand 1/2 inch in size.     Baseline  unable at eval    Time  12    Period  Weeks    Status  New    Target Date  03/24/18      OT LONG TERM GOAL #3   Title  Patient to perform UB dressing with modified independence     Baseline  min assist at eval    Time  6    Period  Weeks    Status  New    Target Date  02/10/18      OT LONG TERM GOAL #4   Title  Patient will complete bathing with supervision    Baseline  moderate assist at eval     Time  12    Period  Weeks    Status  New    Target Date  03/24/18      OT LONG TERM GOAL #5   Title  Patient will demonstrate HEP independently    Baseline  no current program     Time  12    Period  Weeks    Status  New     Target Date  03/24/18      Long Term Additional Goals   Additional Long Term Goals  Yes      OT LONG TERM GOAL #6   Title  Patient will demonstrate ability to hold pen and write/sign name on important papers with 75% legibility     Baseline  difficulty holding pen and legiblity less than 25% at eval    Time  12    Period  Weeks    Status  New    Target Date  03/24/18            Plan - 01/02/18 0935    Clinical Impression Statement  Patient is an 84 yo female referred to OT for evaluation and treatment for bilateral hand pain.  Patient presents with significant muscle weakness in right hand compared to left, lack of coordination, decreased ROM and decreased ability to perform necessary daily tasks.  Pt with an extensive history since March of this year with falls, infection in her feet, great toe of R foot amputated and has been in a skilled nursing facility for many of those months with recent transition to assistive living facility.  She did not ambulate for many months and recently able to walk short distances with an assistive device, she has had multiple falls. She complains of bilateral hand pain but especially on the right side with reports of difficulty holding items, weakness and stiffness which she reports developed suddenly a few weeks ago, she reports CT scan performed but no evidence of stroke. She does report history of arthritic changes in the hands. She would benefit from skilled OT services to maximize safety and independence in self care and IADL tasks.      Occupational Profile and client history currently impacting functional performance  History of falls, currently being seen by PT, toe amputation, balance, arthritic changes in bilateral UEs, lives alone but currently in assistive  living    Occupational performance deficits (Please refer to evaluation for details):  ADL's;IADL's;Social Participation    Rehab Potential  Good    Current Impairments/barriers affecting  progress:  significant weakness in R hand compared to left, balance, fall risk    OT Frequency  2x / week    OT Duration  12 weeks    OT Treatment/Interventions  Self-care/ADL training;Therapeutic exercise;Moist Heat;Neuromuscular education;Splinting;Patient/family education;Therapeutic activities;Balance training;Contrast Bath;DME and/or AE instruction;Manual Therapy    Clinical Decision Making  Several treatment options, min-mod task modification necessary    Consulted and Agree with Plan of Care  Patient       Patient will benefit from skilled therapeutic intervention in order to improve the following deficits and impairments:  Abnormal gait, Decreased knowledge of use of DME, Impaired flexibility, Pain, Decreased coordination, Decreased mobility, Decreased activity tolerance, Decreased endurance, Decreased range of motion, Decreased strength, Decreased balance, Difficulty walking, Impaired UE functional use  Visit Diagnosis: Muscle weakness (generalized)  Other lack of coordination    Problem List Patient Active Problem List   Diagnosis Date Noted  . Atherosclerosis of native arteries of the extremities with ulceration ([place]) 05/07/2017  . Non-healing ulcer ([place]) 04/16/2017  . Diabetic neuropathy ([place]) 04/07/2017  . Septic shock ([place]) 03/20/2017  . CKD (chronic kidney disease), stage III ([place]) 03/19/2017  . Goals of care, counseling/discussion 03/03/2017  . PAD (peripheral artery disease) ([place]) 02/03/2017  . Abdominal aortic stenosis 02/03/2017  . Bilateral lower extremity edema 02/03/2017  . Anemia 01/29/2017  . B12 deficiency 01/29/2017  . Pancreatic mass 01/29/2017  . Hyponatremia 07/07/2015  . Foot ulcer ([place]) 02/07/2015  . ARF (acute renal failure) ([place]) 11/05/2014  . Diabetic foot infection ([place]) 10/10/2014  . Chronic diastolic heart failure ([place]) 05/31/2014  . HTN (hypertension) 05/31/2014  . DM (diabetes mellitus), type 2, uncontrolled ([place]) 05/31/2014   Merik Mignano T  Sahmya Arai, OTR/L, CLT  Alesa Echevarria 01/02/2018, 9:48 AM  [place] MAIN [facility] SERVICES [place], [place], [address] Phone: [phone]   Fax:  [phone]  Name: MISHAWN DIDION MRN: [id number] Date of Birth: 10-22-1933

## 2018-01-07 ENCOUNTER — Encounter: Payer: PPO | Admitting: Physical Therapy

## 2018-01-11 ENCOUNTER — Encounter: Payer: Self-pay | Admitting: Physical Therapy

## 2018-01-11 ENCOUNTER — Ambulatory Visit: Payer: PPO | Admitting: Physical Therapy

## 2018-01-11 ENCOUNTER — Encounter: Payer: PPO | Admitting: Physical Therapy

## 2018-01-11 DIAGNOSIS — E782 Mixed hyperlipidemia: Secondary | ICD-10-CM | POA: Diagnosis not present

## 2018-01-11 DIAGNOSIS — I482 Chronic atrial fibrillation, unspecified: Secondary | ICD-10-CM | POA: Diagnosis not present

## 2018-01-11 DIAGNOSIS — E1151 Type 2 diabetes mellitus with diabetic peripheral angiopathy without gangrene: Secondary | ICD-10-CM | POA: Diagnosis not present

## 2018-01-11 DIAGNOSIS — R262 Difficulty in walking, not elsewhere classified: Secondary | ICD-10-CM

## 2018-01-11 DIAGNOSIS — E538 Deficiency of other specified B group vitamins: Secondary | ICD-10-CM | POA: Diagnosis not present

## 2018-01-11 DIAGNOSIS — R278 Other lack of coordination: Secondary | ICD-10-CM

## 2018-01-11 DIAGNOSIS — M6281 Muscle weakness (generalized): Secondary | ICD-10-CM

## 2018-01-11 NOTE — Therapy (Signed)
[place] MAIN [facility] SERVICES [address] [place], [place], [address] Phone: [phone]   Fax:  [phone]  Physical Therapy Treatment  Patient Details  Name: Sharon [name] MRN: [id number] Date of Birth: 1933/07/13 Referring Provider (PT): [place], [place]   Encounter Date: 01/11/2018  PT End of Session - 01/11/18 1054    Visit Number  --    Number of Visits  17    Date for PT Re-Evaluation  01/25/18    Authorization Type  /10    PT Start Time  --    PT Stop Time  --    PT Time Calculation (min)  --    Equipment Utilized During Treatment  --    Activity Tolerance  --    Behavior During Therapy  --       Past Medical History:  Diagnosis Date  . Anemia   . Atrial fibrillation ([place])   . Cardiac arrest ([place])   . Cataract   . CHF (congestive heart failure) ([place])   . Diabetes mellitus without complication ([place])   . Edema    feet/ankles occas  . Hip fracture ([place])   . Hyperlipidemia   . Hypertension   . Hypothyroid   . Neuropathy   . Osteomyelitis ([place])    left first metatarsal  . Stroke [facility])     Past Surgical History:  Procedure Laterality Date  . ACHILLES TENDON SURGERY Left 02/08/2015   Procedure: ACHILLES LENGTHENING/KIDNER;  Surgeon: Albertine Patricia, DPM;  Location: [facility] ORS;  Service: Podiatry;  Laterality: Left;  . APPENDECTOMY    . CARDIAC CATHETERIZATION  08/25/13  . CATARACT EXTRACTION W/PHACO Left 07/20/2014   Procedure: CATARACT EXTRACTION PHACO AND INTRAOCULAR LENS PLACEMENT (IOC);  Surgeon: Leandrew Koyanagi, MD;  Location: [facility] ORS;  Service: Ophthalmology;  Laterality: Left;  [place]  1:18                 AP     23.6             CDE   9.69      lot #[phone]  . CHOLECYSTECTOMY    . CORONARY ANGIOPLASTY    . HALLUX VALGUS AKIN Left 02/08/2015   Procedure: HALLUX VALGUS AKIN/ KELLER;  Surgeon: Albertine Patricia, DPM;  Location: [facility] ORS;  Service: Podiatry;  Laterality: Left;  IVA with Local needs 1 hour for this  case   . HEMIARTHROPLASTY HIP Right   . HEMIARTHROPLASTY HIP Left   . INCISION AND DRAINAGE Left 10/11/2014   Procedure: Removal of infected tibial sessmoid;  Surgeon: Albertine Patricia, DPM;  Location: [facility] ORS;  Service: Podiatry;  Laterality: Left;  . IRRIGATION AND DEBRIDEMENT FOOT Right 03/21/2017   Procedure: IRRIGATION AND DEBRIDEMENT FOOT right great toe amputation;  Surgeon: Sharlotte Alamo, DPM;  Location: [facility] ORS;  Service: Podiatry;  Laterality: Right;  . IRRIGATION AND DEBRIDEMENT FOOT Right 04/18/2017   Procedure: IRRIGATION AND DEBRIDEMENT FOOT and application wound vac;  Surgeon: Albertine Patricia, DPM;  Location: [facility] ORS;  Service: Podiatry;  Laterality: Right;  . PERIPHERAL VASCULAR BALLOON ANGIOPLASTY Right 04/17/2017   Procedure: PERIPHERAL VASCULAR BALLOON ANGIOPLASTY;  Surgeon: Katha Cabal, MD;  Location: [place] CV LAB;  Service: Cardiovascular;  Laterality: Right;    There were no vitals filed for this visit.  Subjective Assessment - 01/11/18 1053    Subjective  --    Pertinent History  Patient stopped walking in march. She had a fall at home and she went  to the ER and she had infections in bilateral feet. she had her great toe R foot ampuated and she had a wound on L foot. She went to the hospital and then to [facility]. She went back to the MD and had a second surgery in March and then went to another SNF for 3-4 months. She was not able to walk due to being non weight bearing. She was discharged to [facility] an assisted living facility. she had PT 2 x / week. She was able to walk short distances with RW.  She is able to dress herself, she takes herself to the bathroom with assist. She has had several falls.     Limitations  Walking    How long can you stand comfortably?  15 mins if she is holding on    Patient Stated Goals  She wants to walk again.     Currently in Pain?  --    Pain Score  --         PT Education - 01/11/18 1053    Education Details  --     Person(s) Educated  --    Methods  --    Comprehension  --       PT Short Term Goals - 12/30/17 1152      PT SHORT TERM GOAL #1   Title  Patient will be independent in home exercise program to improve strength/mobility for better functional independence with ADLs.    Baseline  inconsistent    Time  4    Period  Weeks    Status  Partially Met    Target Date  12/28/17      PT SHORT TERM GOAL #2   Title  Patient will transfer from wc to bed with SBA and RW.    Baseline  needs min assist , able to complete stand pivot transfer with RW SBA    Time  4    Period  Weeks    Status  Achieved    Target Date  12/28/17      PT SHORT TERM GOAL #3   Title  Patient will improve her 69 MW test from . 2 m/sec to . 5 m/sec with RW    Baseline  . 2 m/sec, 12/30/17: 0.2 m/s    Time  4    Period  Weeks    Status  Not Met    Target Date  12/28/17        PT Long Term Goals - 12/30/17 1206      PT LONG TERM GOAL #1   Title  Patient will be able to perform 5 sit to stand transfers with RW using her hands to push up from a 17 inch chair    Baseline  performs sit to stand transfer with UE assist from 20 inch wc 5 x in 37 seconds    Time  8    Period  Weeks    Status  Not Met    Target Date  01/25/18      PT LONG TERM GOAL #2   Title  Patient will be able to stand x 10 mins with SBA to be able to perform standing activiites at the counter    Baseline  patient is able to stand for 6  min    Time  8    Period  Weeks    Status  Not Met    Target Date  01/25/18      PT  LONG TERM GOAL #3   Title  Patient will be able to ambulate with RW and SBA  assist x 200 feet     Baseline  Ambulates with min assist and wc following 100 feet     Time  8    Period  Weeks    Status  Not Met    Target Date  01/25/18      PT LONG TERM GOAL #4   Title  patient will impvoe BLE hip strength to 4/5 to improve mobility and safety    Baseline  2/5 hip abd and add,, 3/5 hip flex, unable to test hip ext    Time   8    Period  Weeks    Status  Not Met    Target Date  01/25/18      PT LONG TERM GOAL #5   Title  Patient will improve static standing balance from poor plus to fair and be able to reach for objects on the counter top.    Baseline  poor plus static standing balance, poor plus standing balance 12/18    Time  8    Period  Weeks    Status  Not Met    Target Date  01/25/18       Patient arrived to clinic early and then realized she had a conflicting appointment scheduled at [facility], which prompted her to leave/cancel her PT appointment. She/her caregiver and driver were informed by front desk staff that if she could make it back to [facility] by 1130, she would be seen for an abbreviated visit. However, the patient was unable to make it back.     Plan - 01/11/18 1055    Clinical Impression Statement  --    Rehab Potential  Fair    PT Frequency  2x / week    PT Duration  8 weeks    PT Treatment/Interventions  Aquatic Therapy;Gait training;Therapeutic exercise;Therapeutic activities;Functional mobility training;Balance training;Neuromuscular re-education;Patient/family education;Moist Heat    PT Next Visit Plan  LE strengthening, gait training    PT Home Exercise Plan  bridges, marching, hip abd/ER GTB, SLR    Consulted and Agree with Plan of Care  --   friend      Patient will benefit from skilled therapeutic intervention in order to improve the following deficits and impairments:  Abnormal gait, Decreased balance, Decreased endurance, Decreased mobility, Difficulty walking, Impaired UE functional use, Decreased strength, Decreased activity tolerance  Visit Diagnosis: Muscle weakness (generalized)  Difficulty in walking, not elsewhere classified  Other lack of coordination     Problem List Patient Active Problem List   Diagnosis Date Noted  . Atherosclerosis of native arteries of the extremities with ulceration ([place]) 05/07/2017  . Non-healing ulcer ([place])  04/16/2017  . Diabetic neuropathy ([place]) 04/07/2017  . Septic shock ([place]) 03/20/2017  . CKD (chronic kidney disease), stage III ([place]) 03/19/2017  . Goals of care, counseling/discussion 03/03/2017  . PAD (peripheral artery disease) ([place]) 02/03/2017  . Abdominal aortic stenosis 02/03/2017  . Bilateral lower extremity edema 02/03/2017  . Anemia 01/29/2017  . B12 deficiency 01/29/2017  . Pancreatic mass 01/29/2017  . Hyponatremia 07/07/2015  . Foot ulcer ([place]) 02/07/2015  . ARF (acute renal failure) ([place]) 11/05/2014  . Diabetic foot infection ([place]) 10/10/2014  . Chronic diastolic heart failure ([place]) 05/31/2014  . HTN (hypertension) 05/31/2014  . DM (diabetes mellitus), type 2, uncontrolled ([place]) 05/31/2014   Myles Gip PT, DPT [phone] 01/11/2018, 11:42 AM  [place] MAIN [facility] SERVICES [address] [place], [place], [address] Phone: [phone]   Fax:  [phone]  Name: Sharon [name] MRN: [id number] Date of Birth: 1933/12/10

## 2018-01-14 ENCOUNTER — Encounter: Payer: PPO | Admitting: Physical Therapy

## 2018-01-18 ENCOUNTER — Ambulatory Visit: Payer: PPO | Admitting: Occupational Therapy

## 2018-01-18 ENCOUNTER — Ambulatory Visit: Payer: PPO | Attending: Podiatry | Admitting: Physical Therapy

## 2018-01-18 ENCOUNTER — Encounter: Payer: Self-pay | Admitting: Physical Therapy

## 2018-01-18 DIAGNOSIS — M6281 Muscle weakness (generalized): Secondary | ICD-10-CM | POA: Diagnosis not present

## 2018-01-18 DIAGNOSIS — R278 Other lack of coordination: Secondary | ICD-10-CM | POA: Insufficient documentation

## 2018-01-18 DIAGNOSIS — R262 Difficulty in walking, not elsewhere classified: Secondary | ICD-10-CM | POA: Diagnosis not present

## 2018-01-18 NOTE — Therapy (Signed)
[place] MAIN [facility] SERVICES [address] [place], [place], [address] Phone: [phone]   Fax:  [phone]  Physical Therapy Treatment  Patient Details  Name: Sharon [name] MRN: [id number] Date of Birth: [birth year] Referring Provider (PT): [place], [place]   Encounter Date: 01/18/2018  PT End of Session - 01/18/18 1309    Visit Number  8    Number of Visits  17    Date for PT Re-Evaluation  01/25/18    Authorization Type  8/10    PT Start Time  0105    PT Stop Time  0145    PT Time Calculation (min)  40 min    Equipment Utilized During Treatment  Gait belt    Activity Tolerance  Patient tolerated treatment well;Patient limited by fatigue       Past Medical History:  Diagnosis Date  . Anemia   . Atrial fibrillation ([place])   . Cardiac arrest ([place])   . Cataract   . CHF (congestive heart failure) ([place])   . Diabetes mellitus without complication ([place])   . Edema    feet/ankles occas  . Hip fracture ([place])   . Hyperlipidemia   . Hypertension   . Hypothyroid   . Neuropathy   . Osteomyelitis ([place])    left first metatarsal  . Stroke [facility])     Past Surgical History:  Procedure Laterality Date  . ACHILLES TENDON SURGERY Left 02/08/2015   Procedure: ACHILLES LENGTHENING/KIDNER;  Surgeon: Albertine Patricia, DPM;  Location: [facility] ORS;  Service: Podiatry;  Laterality: Left;  . APPENDECTOMY    . CARDIAC CATHETERIZATION  08/25/13  . CATARACT EXTRACTION W/PHACO Left 07/20/2014   Procedure: CATARACT EXTRACTION PHACO AND INTRAOCULAR LENS PLACEMENT (IOC);  Surgeon: Leandrew Koyanagi, MD;  Location: [facility] ORS;  Service: Ophthalmology;  Laterality: Left;  [place]  1:18                 AP     23.6             CDE   9.69      lot #[id number]  . CHOLECYSTECTOMY    . CORONARY ANGIOPLASTY    . HALLUX VALGUS AKIN Left 02/08/2015   Procedure: HALLUX VALGUS AKIN/ KELLER;  Surgeon: Albertine Patricia, DPM;  Location: [facility] ORS;  Service: Podiatry;  Laterality: Left;   IVA with Local needs 1 hour for this case   . HEMIARTHROPLASTY HIP Right   . HEMIARTHROPLASTY HIP Left   . INCISION AND DRAINAGE Left 10/11/2014   Procedure: Removal of infected tibial sessmoid;  Surgeon: Albertine Patricia, DPM;  Location: [facility] ORS;  Service: Podiatry;  Laterality: Left;  . IRRIGATION AND DEBRIDEMENT FOOT Right 03/21/2017   Procedure: IRRIGATION AND DEBRIDEMENT FOOT right great toe amputation;  Surgeon: Sharlotte Alamo, DPM;  Location: [facility] ORS;  Service: Podiatry;  Laterality: Right;  . IRRIGATION AND DEBRIDEMENT FOOT Right 04/18/2017   Procedure: IRRIGATION AND DEBRIDEMENT FOOT and application wound vac;  Surgeon: Albertine Patricia, DPM;  Location: [facility] ORS;  Service: Podiatry;  Laterality: Right;  . PERIPHERAL VASCULAR BALLOON ANGIOPLASTY Right 04/17/2017   Procedure: PERIPHERAL VASCULAR BALLOON ANGIOPLASTY;  Surgeon: Katha Cabal, MD;  Location: [place] CV LAB;  Service: Cardiovascular;  Laterality: Right;    There were no vitals filed for this visit.  Subjective Assessment - 01/18/18 1308    Subjective  Pt denies any new falls; reports doing HEP . has not had any new falls;     Pertinent History  Patient stopped walking in march. She had a fall at home and she went to the ER and she had infections in bilateral feet. she had her great toe R foot ampuated and she had a wound on L foot. She went to the hospital and then to [facility]. She went back to the MD and had a second surgery in March and then went to another SNF for 3-4 months. She was not able to walk due to being non weight bearing. She was discharged to [facility] an assisted living facility. she had PT 2 x / week. She was able to walk short distances with RW.  She is able to dress herself, she takes herself to the bathroom with assist. She has had several falls.     Limitations  Walking    How long can you stand comfortably?  15 mins if she is holding on    Patient Stated Goals  She wants to walk again.      Currently in Pain?  No/denies    Pain Score  0-No pain       Instructed patient in LE strengthening: Hooklying with yellow tband around BLE: Hip abduction/ER 2x15 Hip flexion march x15 Bridges with arms  By side x15  Gait training with RW 75 feet x 1, 90 feet x 1, 75 feet x 1, 100 feet x 1 with wc following behind and flexed knees. Patient educated in HEP and need to stand for part of HEP.                       PT Education - 01/18/18 1308    Education Details  HEP    Person(s) Educated  Patient    Methods  Explanation    Comprehension  Verbalized understanding;Need further instruction       PT Short Term Goals - 12/30/17 1152      PT SHORT TERM GOAL #1   Title  Patient will be independent in home exercise program to improve strength/mobility for better functional independence with ADLs.    Baseline  inconsistent    Time  4    Period  Weeks    Status  Partially Met    Target Date  12/28/17      PT SHORT TERM GOAL #2   Title  Patient will transfer from wc to bed with SBA and RW.    Baseline  needs min assist , able to complete stand pivot transfer with RW SBA    Time  4    Period  Weeks    Status  Achieved    Target Date  12/28/17      PT SHORT TERM GOAL #3   Title  Patient will improve her 67 MW test from . 2 m/sec to . 5 m/sec with RW    Baseline  . 2 m/sec, 12/30/17: 0.2 m/s    Time  4    Period  Weeks    Status  Not Met    Target Date  12/28/17        PT Long Term Goals - 12/30/17 1206      PT LONG TERM GOAL #1   Title  Patient will be able to perform 5 sit to stand transfers with RW using her hands to push up from a 17 inch chair    Baseline  performs sit to stand transfer with UE assist from 20 inch wc 5 x in 37 seconds    Time  8    Period  Weeks    Status  Not Met    Target Date  01/25/18      PT LONG TERM GOAL #2   Title  Patient will be able to stand x 10 mins with SBA to be able to perform standing activiites at the counter     Baseline  patient is able to stand for 6  min    Time  8    Period  Weeks    Status  Not Met    Target Date  01/25/18      PT LONG TERM GOAL #3   Title  Patient will be able to ambulate with RW and SBA  assist x 200 feet     Baseline  Ambulates with min assist and wc following 100 feet     Time  8    Period  Weeks    Status  Not Met    Target Date  01/25/18      PT LONG TERM GOAL #4   Title  patient will impvoe BLE hip strength to 4/5 to improve mobility and safety    Baseline  2/5 hip abd and add,, 3/5 hip flex, unable to test hip ext    Time  8    Period  Weeks    Status  Not Met    Target Date  01/25/18      PT LONG TERM GOAL #5   Title  Patient will improve static standing balance from poor plus to fair and be able to reach for objects on the counter top.    Baseline  poor plus static standing balance, poor plus standing balance 12/18    Time  8    Period  Weeks    Status  Not Met    Target Date  01/25/18            Plan - 01/18/18 1310    Clinical Impression Statement  Pt presents with decreased gait speed with rollator and needs rest periods during standing exercises and decreased standing tolerance and LE weakness and requires frequent verbal cues for upright posture and education provided on relationship between posture and gait. Patient demonstrates instability with therapeutic exercises and balance exercises but tolerates all interventions well. Patient will benefit from continued skilled PT interventions for improved balance, posture, strength, and QOL.    Rehab Potential  Fair    PT Frequency  2x / week    PT Duration  8 weeks    PT Treatment/Interventions  Aquatic Therapy;Gait training;Therapeutic exercise;Therapeutic activities;Functional mobility training;Balance training;Neuromuscular re-education;Patient/family education;Moist Heat    PT Next Visit Plan  LE strengthening, gait training    PT Home Exercise Plan  bridges, marching, hip abd/ER GTB, SLR     Consulted and Agree with Plan of Care  Patient;Other (Comment)   friend      Patient will benefit from skilled therapeutic intervention in order to improve the following deficits and impairments:  Abnormal gait, Decreased balance, Decreased endurance, Decreased mobility, Difficulty walking, Impaired UE functional use, Decreased strength, Decreased activity tolerance  Visit Diagnosis: Muscle weakness (generalized)  Difficulty in walking, not elsewhere classified  Other lack of coordination     Problem List Patient Active Problem List   Diagnosis Date Noted  . Atherosclerosis of native arteries of the extremities with ulceration ([place]) 05/07/2017  . Non-healing ulcer ([place]) 04/16/2017  . Diabetic neuropathy ([place]) 04/07/2017  . Septic shock ([place]) 03/20/2017  . CKD (chronic kidney  disease), stage III ([place]) 03/19/2017  . Goals of care, counseling/discussion 03/03/2017  . PAD (peripheral artery disease) ([place]) 02/03/2017  . Abdominal aortic stenosis 02/03/2017  . Bilateral lower extremity edema 02/03/2017  . Anemia 01/29/2017  . B12 deficiency 01/29/2017  . Pancreatic mass 01/29/2017  . Hyponatremia 07/07/2015  . Foot ulcer ([place]) 02/07/2015  . ARF (acute renal failure) ([place]) 11/05/2014  . Diabetic foot infection ([place]) 10/10/2014  . Chronic diastolic heart failure ([place]) 05/31/2014  . HTN (hypertension) 05/31/2014  . DM (diabetes mellitus), type 2, uncontrolled ([place]) 05/31/2014    Alanson Puls, PT DPT 01/18/2018, 1:12 PM  [place] MAIN [facility] SERVICES [address] [place], [place], [address] Phone: [phone]   Fax:  [phone]  Name: Sharon [name] MRN: [id number] Date of Birth: [birth year]

## 2018-01-20 ENCOUNTER — Ambulatory Visit: Payer: PPO | Admitting: Physical Therapy

## 2018-01-20 ENCOUNTER — Ambulatory Visit: Payer: PPO | Admitting: Occupational Therapy

## 2018-01-20 DIAGNOSIS — M6281 Muscle weakness (generalized): Secondary | ICD-10-CM

## 2018-01-20 DIAGNOSIS — R278 Other lack of coordination: Secondary | ICD-10-CM

## 2018-01-20 DIAGNOSIS — R262 Difficulty in walking, not elsewhere classified: Secondary | ICD-10-CM

## 2018-01-20 NOTE — Therapy (Signed)
[place] MAIN [facility] SERVICES [address] [place], [place], [address] Phone: [phone]   Fax:  [phone]  Physical Therapy Treatment  Patient Details  Name: Sharon [name] MRN: [id number] Date of Birth: 25-Jul-1933 Referring Provider (PT): [place], [place]   Encounter Date: 01/20/2018  PT End of Session - 01/20/18 1314    Visit Number  9    Number of Visits  17    Date for PT Re-Evaluation  01/25/18    Authorization Type  9/10    PT Start Time  0105    PT Stop Time  0145    PT Time Calculation (min)  40 min    Equipment Utilized During Treatment  Gait belt    Activity Tolerance  Patient tolerated treatment well;Patient limited by fatigue       Past Medical History:  Diagnosis Date  . Anemia   . Atrial fibrillation ([place])   . Cardiac arrest ([place])   . Cataract   . CHF (congestive heart failure) ([place])   . Diabetes mellitus without complication ([place])   . Edema    feet/ankles occas  . Hip fracture ([place])   . Hyperlipidemia   . Hypertension   . Hypothyroid   . Neuropathy   . Osteomyelitis ([place])    left first metatarsal  . Stroke [facility])     Past Surgical History:  Procedure Laterality Date  . ACHILLES TENDON SURGERY Left 02/08/2015   Procedure: ACHILLES LENGTHENING/KIDNER;  Surgeon: Albertine Patricia, DPM;  Location: [facility] ORS;  Service: Podiatry;  Laterality: Left;  . APPENDECTOMY    . CARDIAC CATHETERIZATION  08/25/13  . CATARACT EXTRACTION W/PHACO Left 07/20/2014   Procedure: CATARACT EXTRACTION PHACO AND INTRAOCULAR LENS PLACEMENT (IOC);  Surgeon: Leandrew Koyanagi, MD;  Location: [facility] ORS;  Service: Ophthalmology;  Laterality: Left;  [place]  1:18                 AP     23.6             CDE   9.69      lot #[id number]  . CHOLECYSTECTOMY    . CORONARY ANGIOPLASTY    . HALLUX VALGUS AKIN Left 02/08/2015   Procedure: HALLUX VALGUS AKIN/ KELLER;  Surgeon: Albertine Patricia, DPM;  Location: [facility] ORS;  Service: Podiatry;  Laterality: Left;   IVA with Local needs 1 hour for this case   . HEMIARTHROPLASTY HIP Right   . HEMIARTHROPLASTY HIP Left   . INCISION AND DRAINAGE Left 10/11/2014   Procedure: Removal of infected tibial sessmoid;  Surgeon: Albertine Patricia, DPM;  Location: [facility] ORS;  Service: Podiatry;  Laterality: Left;  . IRRIGATION AND DEBRIDEMENT FOOT Right 03/21/2017   Procedure: IRRIGATION AND DEBRIDEMENT FOOT right great toe amputation;  Surgeon: Sharlotte Alamo, DPM;  Location: [facility] ORS;  Service: Podiatry;  Laterality: Right;  . IRRIGATION AND DEBRIDEMENT FOOT Right 04/18/2017   Procedure: IRRIGATION AND DEBRIDEMENT FOOT and application wound vac;  Surgeon: Albertine Patricia, DPM;  Location: [facility] ORS;  Service: Podiatry;  Laterality: Right;  . PERIPHERAL VASCULAR BALLOON ANGIOPLASTY Right 04/17/2017   Procedure: PERIPHERAL VASCULAR BALLOON ANGIOPLASTY;  Surgeon: Katha Cabal, MD;  Location: [place] CV LAB;  Service: Cardiovascular;  Laterality: Right;    There were no vitals filed for this visit.  Subjective Assessment - 01/20/18 1314    Subjective  Pt denies any new falls; reports doing HEP . has not had any new falls;     Pertinent History  Patient stopped walking in march. She had a fall at home and she went to the ER and she had infections in bilateral feet. she had her great toe R foot ampuated and she had a wound on L foot. She went to the hospital and then to [facility]. She went back to the MD and had a second surgery in March and then went to another SNF for 3-4 months. She was not able to walk due to being non weight bearing. She was discharged to [facility] an assisted living facility. she had PT 2 x / week. She was able to walk short distances with RW.  She is able to dress herself, she takes herself to the bathroom with assist. She has had several falls.     Limitations  Walking    How long can you stand comfortably?  15 mins if she is holding on    Patient Stated Goals  She wants to walk again.      Currently in Pain?  No/denies    Pain Score  0-No pain       Treatment:  Instructed patient in LE strengthening: Hip flexion march x15,seated Bridges with arms  By side x15 SLR x 15 x 2 BLE SAQ x 15 x 2 BLE hooklying marching x 20  Hooklying hip abd/ER x 20 with YTB sidelying hip abd x 15 , difficulty with RLE LAQ x 10 BLE with 3 sec hold Sit to stand x 10 , with cga Leg press 90 lbs x 20 x 3  Rolling left and right with verbal and tactile cues and increased time for rolling  X 10  Patient has no reports of pain and has difficulty with motor planning to perform rolling.                        PT Short Term Goals - 12/30/17 1152      PT SHORT TERM GOAL #1   Title  Patient will be independent in home exercise program to improve strength/mobility for better functional independence with ADLs.    Baseline  inconsistent    Time  4    Period  Weeks    Status  Partially Met    Target Date  12/28/17      PT SHORT TERM GOAL #2   Title  Patient will transfer from wc to bed with SBA and RW.    Baseline  needs min assist , able to complete stand pivot transfer with RW SBA    Time  4    Period  Weeks    Status  Achieved    Target Date  12/28/17      PT SHORT TERM GOAL #3   Title  Patient will improve her 13 MW test from . 2 m/sec to . 5 m/sec with RW    Baseline  . 2 m/sec, 12/30/17: 0.2 m/s    Time  4    Period  Weeks    Status  Not Met    Target Date  12/28/17        PT Long Term Goals - 12/30/17 1206      PT LONG TERM GOAL #1   Title  Patient will be able to perform 5 sit to stand transfers with RW using her hands to push up from a 17 inch chair    Baseline  performs sit to stand transfer with UE assist from 20 inch wc 5 x in 37  seconds    Time  8    Period  Weeks    Status  Not Met    Target Date  01/25/18      PT LONG TERM GOAL #2   Title  Patient will be able to stand x 10 mins with SBA to be able to perform standing activiites at the counter     Baseline  patient is able to stand for 6  min    Time  8    Period  Weeks    Status  Not Met    Target Date  01/25/18      PT LONG TERM GOAL #3   Title  Patient will be able to ambulate with RW and SBA  assist x 200 feet     Baseline  Ambulates with min assist and wc following 100 feet     Time  8    Period  Weeks    Status  Not Met    Target Date  01/25/18      PT LONG TERM GOAL #4   Title  patient will impvoe BLE hip strength to 4/5 to improve mobility and safety    Baseline  2/5 hip abd and add,, 3/5 hip flex, unable to test hip ext    Time  8    Period  Weeks    Status  Not Met    Target Date  01/25/18      PT LONG TERM GOAL #5   Title  Patient will improve static standing balance from poor plus to fair and be able to reach for objects on the counter top.    Baseline  poor plus static standing balance, poor plus standing balance 12/18    Time  8    Period  Weeks    Status  Not Met    Target Date  01/25/18            Plan - 01/20/18 1314    Clinical Impression Statement  Patient instructed in strengthening with min assist. Patient continues to have deficits in strength of weakness decreased flexibility of LE's, gait with rollator and SBA dynamic standing balance deficits. Patient is improving with functional tasks including sit to stand, and is making functional improvements, and improving in her outcome measures.  Patient will benefit from continued skilled PT to improve dynamic standing balance, strength, gait, decrease pain, and reach functional goals. Patient needs several rest periods throughout treatment due to fatigue    Rehab Potential  Fair    PT Frequency  2x / week    PT Duration  8 weeks    PT Treatment/Interventions  Aquatic Therapy;Gait training;Therapeutic exercise;Therapeutic activities;Functional mobility training;Balance training;Neuromuscular re-education;Patient/family education;Moist Heat    PT Next Visit Plan  LE strengthening, gait training     PT Home Exercise Plan  bridges, marching, hip abd/ER GTB, SLR    Consulted and Agree with Plan of Care  Patient;Other (Comment)   friend      Patient will benefit from skilled therapeutic intervention in order to improve the following deficits and impairments:  Abnormal gait, Decreased balance, Decreased endurance, Decreased mobility, Difficulty walking, Impaired UE functional use, Decreased strength, Decreased activity tolerance  Visit Diagnosis: Muscle weakness (generalized)  Difficulty in walking, not elsewhere classified  Other lack of coordination     Problem List Patient Active Problem List   Diagnosis Date Noted  . Atherosclerosis of native arteries of the extremities with ulceration ([place]) 05/07/2017  . Non-healing ulcer (  [place]) 04/16/2017  . Diabetic neuropathy ([place]) 04/07/2017  . Septic shock ([place]) 03/20/2017  . CKD (chronic kidney disease), stage III ([place]) 03/19/2017  . Goals of care, counseling/discussion 03/03/2017  . PAD (peripheral artery disease) ([place]) 02/03/2017  . Abdominal aortic stenosis 02/03/2017  . Bilateral lower extremity edema 02/03/2017  . Anemia 01/29/2017  . B12 deficiency 01/29/2017  . Pancreatic mass 01/29/2017  . Hyponatremia 07/07/2015  . Foot ulcer ([place]) 02/07/2015  . ARF (acute renal failure) ([place]) 11/05/2014  . Diabetic foot infection ([place]) 10/10/2014  . Chronic diastolic heart failure ([place]) 05/31/2014  . HTN (hypertension) 05/31/2014  . DM (diabetes mellitus), type 2, uncontrolled ([place]) 05/31/2014    Alanson Puls, PT DPT 01/20/2018, 1:22 PM  [place] MAIN [facility] SERVICES [address] [place], [place], [address] Phone: [phone]   Fax:  [phone]  Name: Sharon [name] MRN: [id number] Date of Birth: July 14, 1933

## 2018-01-21 ENCOUNTER — Encounter: Payer: Self-pay | Admitting: Occupational Therapy

## 2018-01-21 NOTE — Therapy (Signed)
[place] MAIN [facility] SERVICES [address] [place], [place], [address] Phone: [phone]   Fax:  [phone]  Occupational Therapy Treatment  Patient Details  Name: Sharon [name] MRN: [id number] Date of Birth: 04-10-1933 Referring Provider (OT): Emily Filbert   Encounter Date: 01/18/2018  OT End of Session - 01/21/18 1124    Visit Number  2    Number of Visits  24    Date for OT Re-Evaluation  03/24/18    Authorization Type  Medicare visit 2 of 10, reporting period starting 12/30/2017    OT Start Time  0145    OT Stop Time  0231    OT Time Calculation (min)  46 min    Activity Tolerance  Patient tolerated treatment well    Behavior During Therapy  [facility] for tasks assessed/performed       Past Medical History:  Diagnosis Date  . Anemia   . Atrial fibrillation ([place])   . Cardiac arrest ([place])   . Cataract   . CHF (congestive heart failure) ([place])   . Diabetes mellitus without complication ([place])   . Edema    feet/ankles occas  . Hip fracture ([place])   . Hyperlipidemia   . Hypertension   . Hypothyroid   . Neuropathy   . Osteomyelitis ([place])    left first metatarsal  . Stroke [facility])     Past Surgical History:  Procedure Laterality Date  . ACHILLES TENDON SURGERY Left 02/08/2015   Procedure: ACHILLES LENGTHENING/KIDNER;  Surgeon: Albertine Patricia, DPM;  Location: [facility] ORS;  Service: Podiatry;  Laterality: Left;  . APPENDECTOMY    . CARDIAC CATHETERIZATION  08/25/13  . CATARACT EXTRACTION W/PHACO Left 07/20/2014   Procedure: CATARACT EXTRACTION PHACO AND INTRAOCULAR LENS PLACEMENT (IOC);  Surgeon: Leandrew Koyanagi, MD;  Location: [facility] ORS;  Service: Ophthalmology;  Laterality: Left;  [place]  1:18                 AP     23.6             CDE   9.69      lot #[id number]  . CHOLECYSTECTOMY    . CORONARY ANGIOPLASTY    . HALLUX VALGUS AKIN Left 02/08/2015   Procedure: HALLUX VALGUS AKIN/ KELLER;  Surgeon: Albertine Patricia, DPM;  Location: [facility] ORS;   Service: Podiatry;  Laterality: Left;  IVA with Local needs 1 hour for this case   . HEMIARTHROPLASTY HIP Right   . HEMIARTHROPLASTY HIP Left   . INCISION AND DRAINAGE Left 10/11/2014   Procedure: Removal of infected tibial sessmoid;  Surgeon: Albertine Patricia, DPM;  Location: [facility] ORS;  Service: Podiatry;  Laterality: Left;  . IRRIGATION AND DEBRIDEMENT FOOT Right 03/21/2017   Procedure: IRRIGATION AND DEBRIDEMENT FOOT right great toe amputation;  Surgeon: Sharlotte Alamo, DPM;  Location: [facility] ORS;  Service: Podiatry;  Laterality: Right;  . IRRIGATION AND DEBRIDEMENT FOOT Right 04/18/2017   Procedure: IRRIGATION AND DEBRIDEMENT FOOT and application wound vac;  Surgeon: Albertine Patricia, DPM;  Location: [facility] ORS;  Service: Podiatry;  Laterality: Right;  . PERIPHERAL VASCULAR BALLOON ANGIOPLASTY Right 04/17/2017   Procedure: PERIPHERAL VASCULAR BALLOON ANGIOPLASTY;  Surgeon: Katha Cabal, MD;  Location: [place] CV LAB;  Service: Cardiovascular;  Laterality: Right;    There were no vitals filed for this visit.  Subjective Assessment - 01/21/18 1123    Subjective   Patient reports she continues to have difficulty with using the right hand for any  self care or daily tasks.  She still is unable to hold items and is currently feeding herself with her left hand only     Pertinent History  Pt with an extensive history since March of this year with falls, infection in her feet, great toe of R foot amputated and has been in a skilled nursing facility for many of those months with recent transition to assistive living facility.  She did not ambulate for many months and recently able to walk short distances with an assistive device, she has had multiple falls. She complains of bilateral hand pain but especially on the right side with reports of difficulty holding items, weakness and stiffness which she reports developed suddenly a few weeks ago, she reports CT scan performed but no evidence of stroke. She  does report history of arthritic changes in the hands    Patient Stated Goals  Patient reports she would like to be able to get up and walk, use right hand for self care.  Be able to write again.    Currently in Pain?  No/denies    Pain Score  0-No pain    Multiple Pain Sites  No           Therapeutic Exercise: Patient seen to address muscle weakness in right hand, patient able to perform light gripping activities today.  Assessment of  Level of putty, able to perform light tan putty but is able to use light yellow resistive putty to provide a bit more of resistance.   Instructed on light gripping tasks, 2 point and 3 point pinch skills for 10 repetitions each.  Issued written instructions via medbridge  And printed out exercises for patient to perform at home, issued yellow putty as well.  Instructed to perform 2 times a day for 10 reps Each however, if she has pain or increased soreness in right hand to decrease to 1 time a day and/or decrease repetitions.  She is able to demonstrate understanding.  Patient requires cues for technique to complete exercises.  Patient seen for dowel exercises for shoulder and arm with 1# dowel, therapist assisting with guiding on right UE.  Patient performing  Shoulder flexion, ABD/ADD, chest press, forwards and backwards arm circles for 10 repetitions each.  Guiding from therapist for motion  And occasional adjustment of grip on dowel on the right side.                 OT Education - 01/21/18 1124    Education Details  putty exercises for light gripping tasks on the right    Person(s) Educated  Patient    Methods  Explanation;Demonstration;Verbal cues    Comprehension  Verbalized understanding;Returned demonstration;Verbal cues required          OT Long Term Goals - 01/02/18 0943      OT LONG TERM GOAL #1   Title  Patient will demonstrate increase in grip strength by 5# to be able to hold utensils in right hand for self feeding.      Baseline  currently using left hand to feed self     Time  6    Period  Weeks    Status  New    Target Date  02/10/18      OT LONG TERM GOAL #2   Title  Patient will demonstrate improved coordination to pick up small objects in right hand 1/2 inch in size.     Baseline  unable at eval  Time  12    Period  Weeks    Status  New    Target Date  03/24/18      OT LONG TERM GOAL #3   Title  Patient to perform UB dressing with modified independence     Baseline  min assist at eval    Time  6    Period  Weeks    Status  New    Target Date  02/10/18      OT LONG TERM GOAL #4   Title  Patient will complete bathing with supervision    Baseline  moderate assist at eval     Time  12    Period  Weeks    Status  New    Target Date  03/24/18      OT LONG TERM GOAL #5   Title  Patient will demonstrate HEP independently    Baseline  no current program     Time  12    Period  Weeks    Status  New    Target Date  03/24/18      Long Term Additional Goals   Additional Long Term Goals  Yes      OT LONG TERM GOAL #6   Title  Patient will demonstrate ability to hold pen and write/sign name on important papers with 75% legibility     Baseline  difficulty holding pen and legiblity less than 25% at eval    Time  12    Period  Weeks    Status  New    Target Date  03/24/18            Plan - 01/21/18 1125    Clinical Impression Statement  Patient continues to demonstrate significant muscle weakness on the right side which affects her ability to perform necessary daily tasks.  She is still unable to use the right dominant hand to perform self feeding and is currently feeding self with left hand, she continues to require assist with opening containers, packets, etc.  She does demonstrate a slight increase grip on the right today compared to last session a few weeks ago.  Continue to work towards goals in [place] to increase functional use of right UE and to improve safety and independence  with daily tasks.     Occupational Profile and client history currently impacting functional performance  History of falls, currently being seen by PT, toe amputation, balance, arthritic changes in bilateral UEs, lives alone but currently in assistive living    Occupational performance deficits (Please refer to evaluation for details):  ADL's;IADL's;Social Participation    Rehab Potential  Good    Current Impairments/barriers affecting progress:  significant weakness in R hand compared to left, balance, fall risk    OT Frequency  2x / week    OT Duration  12 weeks    OT Treatment/Interventions  Self-care/ADL training;Therapeutic exercise;Moist Heat;Neuromuscular education;Splinting;Patient/family education;Therapeutic activities;Balance training;Contrast Bath;DME and/or AE instruction;Manual Therapy    Consulted and Agree with Plan of Care  Patient       Patient will benefit from skilled therapeutic intervention in order to improve the following deficits and impairments:  Abnormal gait, Decreased knowledge of use of DME, Impaired flexibility, Pain, Decreased coordination, Decreased mobility, Decreased activity tolerance, Decreased endurance, Decreased range of motion, Decreased strength, Decreased balance, Difficulty walking, Impaired UE functional use  Visit Diagnosis: Muscle weakness (generalized)  Other lack of coordination    Problem List Patient Active Problem List  Diagnosis Date Noted  . Atherosclerosis of native arteries of the extremities with ulceration ([place]) 05/07/2017  . Non-healing ulcer ([place]) 04/16/2017  . Diabetic neuropathy ([place]) 04/07/2017  . Septic shock ([place]) 03/20/2017  . CKD (chronic kidney disease), stage III ([place]) 03/19/2017  . Goals of care, counseling/discussion 03/03/2017  . PAD (peripheral artery disease) ([place]) 02/03/2017  . Abdominal aortic stenosis 02/03/2017  . Bilateral lower extremity edema 02/03/2017  . Anemia 01/29/2017  . B12 deficiency 01/29/2017   . Pancreatic mass 01/29/2017  . Hyponatremia 07/07/2015  . Foot ulcer ([place]) 02/07/2015  . ARF (acute renal failure) ([place]) 11/05/2014  . Diabetic foot infection ([place]) 10/10/2014  . Chronic diastolic heart failure ([place]) 05/31/2014  . HTN (hypertension) 05/31/2014  . DM (diabetes mellitus), type 2, uncontrolled ([place]) 05/31/2014   Sharon [name], OTR/L, CLT  Sharon [name] 01/21/2018, 11:37 AM  [place] MAIN [facility] SERVICES [address] [place], [place], [address] Phone: [phone]   Fax:  [phone]  Name: Sharon [name] MRN: [id number] Date of Birth: January 20, 1933

## 2018-01-25 ENCOUNTER — Encounter: Payer: Self-pay | Admitting: Physical Therapy

## 2018-01-25 ENCOUNTER — Ambulatory Visit: Payer: PPO | Admitting: Physical Therapy

## 2018-01-25 DIAGNOSIS — M6281 Muscle weakness (generalized): Secondary | ICD-10-CM

## 2018-01-25 DIAGNOSIS — R278 Other lack of coordination: Secondary | ICD-10-CM

## 2018-01-25 DIAGNOSIS — R262 Difficulty in walking, not elsewhere classified: Secondary | ICD-10-CM

## 2018-01-25 NOTE — Therapy (Signed)
[place] MAIN [facility] SERVICES [address] [place], [place], [address] Phone: [phone]   Fax:  [phone]  Physical Therapy Treatment/  Physical Therapy Progress Note   Dates of reporting period  11/30/17  to 01/25/18  Patient Details  Name: Sharon [name] MRN: [id number] Date of Birth: [birth year] Referring Provider (PT): [place], [place]   Encounter Date: 01/25/2018  PT End of Session - 01/25/18 1310    Visit Number  10    Number of Visits  17    Date for PT Re-Evaluation  03/22/18    Authorization Type  10/10    PT Start Time  0100    PT Stop Time  0145    PT Time Calculation (min)  45 min    Equipment Utilized During Treatment  Gait belt    Activity Tolerance  Patient tolerated treatment well;Patient limited by fatigue       Past Medical History:  Diagnosis Date  . Anemia   . Atrial fibrillation ([place])   . Cardiac arrest ([place])   . Cataract   . CHF (congestive heart failure) ([place])   . Diabetes mellitus without complication ([place])   . Edema    feet/ankles occas  . Hip fracture ([place])   . Hyperlipidemia   . Hypertension   . Hypothyroid   . Neuropathy   . Osteomyelitis ([place])    left first metatarsal  . Stroke [facility])     Past Surgical History:  Procedure Laterality Date  . ACHILLES TENDON SURGERY Left 02/08/2015   Procedure: ACHILLES LENGTHENING/KIDNER;  Surgeon: Albertine Patricia, DPM;  Location: [facility] ORS;  Service: Podiatry;  Laterality: Left;  . APPENDECTOMY    . CARDIAC CATHETERIZATION  08/25/13  . CATARACT EXTRACTION W/PHACO Left 07/20/2014   Procedure: CATARACT EXTRACTION PHACO AND INTRAOCULAR LENS PLACEMENT (IOC);  Surgeon: Leandrew Koyanagi, MD;  Location: [facility] ORS;  Service: Ophthalmology;  Laterality: Left;  [place]  1:18                 AP     23.6             CDE   9.69      lot #[phone]  . CHOLECYSTECTOMY    . CORONARY ANGIOPLASTY    . HALLUX VALGUS AKIN Left 02/08/2015   Procedure: HALLUX VALGUS AKIN/ KELLER;   Surgeon: Albertine Patricia, DPM;  Location: [facility] ORS;  Service: Podiatry;  Laterality: Left;  IVA with Local needs 1 hour for this case   . HEMIARTHROPLASTY HIP Right   . HEMIARTHROPLASTY HIP Left   . INCISION AND DRAINAGE Left 10/11/2014   Procedure: Removal of infected tibial sessmoid;  Surgeon: Albertine Patricia, DPM;  Location: [facility] ORS;  Service: Podiatry;  Laterality: Left;  . IRRIGATION AND DEBRIDEMENT FOOT Right 03/21/2017   Procedure: IRRIGATION AND DEBRIDEMENT FOOT right great toe amputation;  Surgeon: Sharlotte Alamo, DPM;  Location: [facility] ORS;  Service: Podiatry;  Laterality: Right;  . IRRIGATION AND DEBRIDEMENT FOOT Right 04/18/2017   Procedure: IRRIGATION AND DEBRIDEMENT FOOT and application wound vac;  Surgeon: Albertine Patricia, DPM;  Location: [facility] ORS;  Service: Podiatry;  Laterality: Right;  . PERIPHERAL VASCULAR BALLOON ANGIOPLASTY Right 04/17/2017   Procedure: PERIPHERAL VASCULAR BALLOON ANGIOPLASTY;  Surgeon: Katha Cabal, MD;  Location: [place] CV LAB;  Service: Cardiovascular;  Laterality: Right;    There were no vitals filed for this visit.  Subjective Assessment - 01/25/18 1310    Subjective  Pt denies any new falls;  reports doing HEP . has not had any new falls;     Pertinent History  Patient stopped walking in march. She had a fall at home and she went to the ER and she had infections in bilateral feet. she had her great toe R foot ampuated and she had a wound on L foot. She went to the hospital and then to [facility]. She went back to the MD and had a second surgery in March and then went to another SNF for 3-4 months. She was not able to walk due to being non weight bearing. She was discharged to [facility] an assisted living facility. she had PT 2 x / week. She was able to walk short distances with RW.  She is able to dress herself, she takes herself to the bathroom with assist. She has had several falls.     Limitations  Walking    How long can you stand comfortably?   15 mins if she is holding on    Patient Stated Goals  She wants to walk again.     Currently in Pain?  No/denies    Pain Score  0-No pain        Treatment: Therapeutic activities:  Performed outcome measures:including 5 x sit to stand, timed standing , strength testing, gait assessment, static standing balance assessment to reassess goals Patient performs transfers from wc to toilet and back to wc with min assist , she needs assist to don and doff her pull up and pants.  Nu-step x 5 mins  LAQ x 20 BLE Hip flex x 20 BLE Seated hip abd/ER with RTB x 20  Gait training 100 feet x 2 with rollator and CGA   Patient is making progress towards goals but continues to have difficulty standing with correct posture and has flexed posture during transfers and gait training. Patient continues to have BLE weakness that causes her mobility to be difficult.                        PT Short Term Goals - 12/30/17 1152      PT SHORT TERM GOAL #1   Title  Patient will be independent in home exercise program to improve strength/mobility for better functional independence with ADLs.    Baseline  inconsistent    Time  4    Period  Weeks    Status  Partially Met    Target Date  12/28/17      PT SHORT TERM GOAL #2   Title  Patient will transfer from wc to bed with SBA and RW.    Baseline  needs min assist , able to complete stand pivot transfer with RW SBA    Time  4    Period  Weeks    Status  Achieved    Target Date  12/28/17      PT SHORT TERM GOAL #3   Title  Patient will improve her 37 MW test from . 2 m/sec to . 5 m/sec with RW    Baseline  . 2 m/sec, 12/30/17: 0.2 m/s    Time  4    Period  Weeks    Status  Not Met    Target Date  12/28/17        PT Long Term Goals - 01/25/18 1311      PT LONG TERM GOAL #1   Title  Patient will be able to perform 5 sit to stand transfers  with RW using her hands to push up from a 17 inch chair    Baseline  performs sit to stand  transfer with UE assist from 20 inch wc 5 x in 37 seconds1/13/20=38.58sec from 18 inch chair    Time  8    Period  Weeks    Status  Partially Met    Target Date  03/22/18      PT LONG TERM GOAL #2   Title  Patient will be able to stand x 10 mins with SBA to be able to perform standing activiites at the counter    Baseline  patient is able to stand for 6  min, 01/25/18= 7 15 seconds    Time  8    Period  Weeks    Status  Partially Met    Target Date  03/22/18      PT LONG TERM GOAL #3   Title  Patient will be able to ambulate with RW and SBA  assist x 200 feet     Baseline  Ambulates with min assist and wc following 100 feet 01/25/18= 125 feet with RW and SBA    Time  8    Period  Weeks    Status  Partially Met    Target Date  03/22/18      PT LONG TERM GOAL #4   Title  patient will impvoe BLE hip strength to 4/5 to improve mobility and safety    Baseline  01/25/18=2/5 hip abd and add,, 3/5 hip flex, unable to test hip ext    Time  8    Period  Weeks    Status  Partially Met    Target Date  03/22/18      PT LONG TERM GOAL #5   Title  Patient will improve static standing balance from poor plus to fair and be able to reach for objects on the counter top.    Baseline  poor plus static standing balance, poor plus standing balance 12/18,  01/25/18 poor plus     Time  8    Period  Weeks    Status  Not Met    Target Date  03/22/18            Plan - 01/25/18 1343    Clinical Impression Statement Patient's condition has the potential to improve in response to therapy. Maximum improvement is yet to be obtained. The anticipated improvement is attainable and reasonable in a generally predictable time.  Patient reports that it is easier to do transfers and perform sit to stand.   Patient presents with decreased gait speed, decreased balance, and decreased BLE strength. Patient's main complaint is BLE weakness and inability to participate in desired activities. Patient wants to improve his  balance and ability to ambulate on indoor surfaces safely. Patient will benefit from skilled PT in order to increase gait speed, increase BLE strength, and improve dynamic standing balance to decrease risk for falls and enable patient to participate in desired activities.    Rehab Potential  Fair    PT Frequency  2x / week    PT Duration  8 weeks    PT Treatment/Interventions  Aquatic Therapy;Gait training;Therapeutic exercise;Therapeutic activities;Functional mobility training;Balance training;Neuromuscular re-education;Patient/family education;Moist Heat    PT Next Visit Plan  LE strengthening, gait training    PT Home Exercise Plan  bridges, marching, hip abd/ER GTB, SLR    Consulted and Agree with Plan of Care  Patient;Other (Comment)   friend  Patient will benefit from skilled therapeutic intervention in order to improve the following deficits and impairments:  Abnormal gait, Decreased balance, Decreased endurance, Decreased mobility, Difficulty walking, Impaired UE functional use, Decreased strength, Decreased activity tolerance  Visit Diagnosis: Muscle weakness (generalized)  Difficulty in walking, not elsewhere classified  Other lack of coordination     Problem List Patient Active Problem List   Diagnosis Date Noted  . Atherosclerosis of native arteries of the extremities with ulceration ([place]) 05/07/2017  . Non-healing ulcer ([place]) 04/16/2017  . Diabetic neuropathy ([place]) 04/07/2017  . Septic shock ([place]) 03/20/2017  . CKD (chronic kidney disease), stage III ([place]) 03/19/2017  . Goals of care, counseling/discussion 03/03/2017  . PAD (peripheral artery disease) ([place]) 02/03/2017  . Abdominal aortic stenosis 02/03/2017  . Bilateral lower extremity edema 02/03/2017  . Anemia 01/29/2017  . B12 deficiency 01/29/2017  . Pancreatic mass 01/29/2017  . Hyponatremia 07/07/2015  . Foot ulcer ([place]) 02/07/2015  . ARF (acute renal failure) ([place]) 11/05/2014  . Diabetic foot  infection ([place]) 10/10/2014  . Chronic diastolic heart failure ([place]) 05/31/2014  . HTN (hypertension) 05/31/2014  . DM (diabetes mellitus), type 2, uncontrolled ([place]) 05/31/2014    Alanson Puls, [place] DPT 01/25/2018, 1:48 PM  [place] MAIN [facility] SERVICES [address] [place], [place], [address] Phone: [phone]   Fax:  [phone]  Name: Sharon [name] MRN: [id number] Date of Birth: 1933/03/02

## 2018-01-27 ENCOUNTER — Ambulatory Visit: Payer: PPO | Admitting: Occupational Therapy

## 2018-01-27 ENCOUNTER — Ambulatory Visit: Payer: PPO | Admitting: Physical Therapy

## 2018-01-27 ENCOUNTER — Encounter: Payer: Self-pay | Admitting: Physical Therapy

## 2018-01-27 DIAGNOSIS — R278 Other lack of coordination: Secondary | ICD-10-CM

## 2018-01-27 DIAGNOSIS — R262 Difficulty in walking, not elsewhere classified: Secondary | ICD-10-CM

## 2018-01-27 DIAGNOSIS — M6281 Muscle weakness (generalized): Secondary | ICD-10-CM

## 2018-01-27 NOTE — Therapy (Signed)
[place] MAIN [facility] SERVICES [address] [place], [place], [address] Phone: [phone]   Fax:  [phone]  Physical Therapy Treatment  Patient Details  Name: Sharon [name] MRN: [id number] Date of Birth: 1933-12-11 Referring Provider (PT): [place], [place]   Encounter Date: 01/27/2018  PT End of Session - 01/27/18 1353    Visit Number  11    Number of Visits  17    Date for PT Re-Evaluation  03/22/18    Authorization Type  1/10    PT Start Time  0147    PT Stop Time  0230    PT Time Calculation (min)  43 min    Equipment Utilized During Treatment  Gait belt    Activity Tolerance  Patient tolerated treatment well;Patient limited by fatigue       Past Medical History:  Diagnosis Date  . Anemia   . Atrial fibrillation ([place])   . Cardiac arrest ([place])   . Cataract   . CHF (congestive heart failure) ([place])   . Diabetes mellitus without complication ([place])   . Edema    feet/ankles occas  . Hip fracture ([place])   . Hyperlipidemia   . Hypertension   . Hypothyroid   . Neuropathy   . Osteomyelitis ([place])    left first metatarsal  . Stroke [facility])     Past Surgical History:  Procedure Laterality Date  . ACHILLES TENDON SURGERY Left 02/08/2015   Procedure: ACHILLES LENGTHENING/KIDNER;  Surgeon: Albertine Patricia, DPM;  Location: [facility] ORS;  Service: Podiatry;  Laterality: Left;  . APPENDECTOMY    . CARDIAC CATHETERIZATION  08/25/13  . CATARACT EXTRACTION W/PHACO Left 07/20/2014   Procedure: CATARACT EXTRACTION PHACO AND INTRAOCULAR LENS PLACEMENT (IOC);  Surgeon: Leandrew Koyanagi, MD;  Location: [facility] ORS;  Service: Ophthalmology;  Laterality: Left;  [place]  1:18                 AP     23.6             CDE   9.69      lot #[id number]  . CHOLECYSTECTOMY    . CORONARY ANGIOPLASTY    . HALLUX VALGUS AKIN Left 02/08/2015   Procedure: HALLUX VALGUS AKIN/ KELLER;  Surgeon: Albertine Patricia, DPM;  Location: [facility] ORS;  Service: Podiatry;  Laterality:  Left;  IVA with Local needs 1 hour for this case   . HEMIARTHROPLASTY HIP Right   . HEMIARTHROPLASTY HIP Left   . INCISION AND DRAINAGE Left 10/11/2014   Procedure: Removal of infected tibial sessmoid;  Surgeon: Albertine Patricia, DPM;  Location: [facility] ORS;  Service: Podiatry;  Laterality: Left;  . IRRIGATION AND DEBRIDEMENT FOOT Right 03/21/2017   Procedure: IRRIGATION AND DEBRIDEMENT FOOT right great toe amputation;  Surgeon: Sharlotte Alamo, DPM;  Location: [facility] ORS;  Service: Podiatry;  Laterality: Right;  . IRRIGATION AND DEBRIDEMENT FOOT Right 04/18/2017   Procedure: IRRIGATION AND DEBRIDEMENT FOOT and application wound vac;  Surgeon: Albertine Patricia, DPM;  Location: [facility] ORS;  Service: Podiatry;  Laterality: Right;  . PERIPHERAL VASCULAR BALLOON ANGIOPLASTY Right 04/17/2017   Procedure: PERIPHERAL VASCULAR BALLOON ANGIOPLASTY;  Surgeon: Katha Cabal, MD;  Location: [facility];  Service: Cardiovascular;  Laterality: Right;    There were no vitals filed for this visit.  Subjective Assessment - 01/27/18 1342    Subjective  Pt denies any new falls; reports doing HEP . has not had any new falls;     Pertinent History  Patient stopped walking in march. She had a fall at home and she went to the ER and she had infections in bilateral feet. she had her great toe R foot ampuated and she had a wound on L foot. She went to the hospital and then to [facility]. She went back to the MD and had a second surgery in March and then went to another SNF for 3-4 months. She was not able to walk due to being non weight bearing. She was discharged to [facility] an assisted living facility. she had PT 2 x / week. She was able to walk short distances with RW.  She is able to dress herself, she takes herself to the bathroom with assist. She has had several falls.     Limitations  Walking    How long can you stand comfortably?  15 mins if she is holding on    Patient Stated Goals  She wants to walk again.          Therapeutic exercise:   nustep level 3 for warm up (unbilled) x90mns   Stepping  fwd side to side x 10   Stepping  bwd x10 cues for posture, increased hip flexion   Standing hip abd without resistance due to weakness 2x5 bilaterally   Standing hip extension without GTB due to increased fatigue today 2x5 BLE   Supine bridges x 10 x 2  Hooklying abd/ER with GTB x 20   hooklying marching x 20   SLR x 10 x 2 BLE  SAQ x 10 with 3 sec hold x 2 sets  Patient needs TC to keep RLE straight during SLR and SAQ vc to hold  Cues for proper technique of exercises, slow eccentric contractions to target specific muscles                       PT Education - 01/27/18 1353    Education Details  HEP    Person(s) Educated  Patient    Methods  Explanation    Comprehension  Verbalized understanding;Need further instruction       PT Short Term Goals - 12/30/17 1152      PT SHORT TERM GOAL #1   Title  Patient will be independent in home exercise program to improve strength/mobility for better functional independence with ADLs.    Baseline  inconsistent    Time  4    Period  Weeks    Status  Partially Met    Target Date  12/28/17      PT SHORT TERM GOAL #2   Title  Patient will transfer from wc to bed with SBA and RW.    Baseline  needs min assist , able to complete stand pivot transfer with RW SBA    Time  4    Period  Weeks    Status  Achieved    Target Date  12/28/17      PT SHORT TERM GOAL #3   Title  Patient will improve her 173MW test from . 2 m/sec to . 5 m/sec with RW    Baseline  . 2 m/sec, 12/30/17: 0.2 m/s    Time  4    Period  Weeks    Status  Not Met    Target Date  12/28/17        PT Long Term Goals - 01/25/18 1311      PT LONG TERM GOAL #1   Title  Patient  will be able to perform 5 sit to stand transfers with RW using her hands to push up from a 17 inch chair    Baseline  performs sit to stand transfer with UE assist from 20 inch wc 5  x in 37 seconds1/13/20=38.58sec from 18 inch chair    Time  8    Period  Weeks    Status  Partially Met    Target Date  03/22/18      PT LONG TERM GOAL #2   Title  Patient will be able to stand x 10 mins with SBA to be able to perform standing activiites at the counter    Baseline  patient is able to stand for 6  min, 01/25/18= 7 15 seconds    Time  8    Period  Weeks    Status  Partially Met    Target Date  03/22/18      PT LONG TERM GOAL #3   Title  Patient will be able to ambulate with RW and SBA  assist x 200 feet     Baseline  Ambulates with min assist and wc following 100 feet 01/25/18= 125 feet with RW and SBA    Time  8    Period  Weeks    Status  Partially Met    Target Date  03/22/18      PT LONG TERM GOAL #4   Title  patient will impvoe BLE hip strength to 4/5 to improve mobility and safety    Baseline  01/25/18=2/5 hip abd and add,, 3/5 hip flex, unable to test hip ext    Time  8    Period  Weeks    Status  Partially Met    Target Date  03/22/18      PT LONG TERM GOAL #5   Title  Patient will improve static standing balance from poor plus to fair and be able to reach for objects on the counter top.    Baseline  poor plus static standing balance, poor plus standing balance 12/18,  01/25/18 poor plus     Time  8    Period  Weeks    Status  Not Met    Target Date  03/22/18            Plan - 01/27/18 1354    History and Personal Factors relevant to plan of care:  Pt requires direction and verbal cues for correct performance of standing dynamic balance exercises and strengthening exercises. .Patient has fatigue with endurance and difficulty with transfers and standing tasks.  Patient struggles with posture and ability to perform sit to stand due to weakness, as well as balance with mobility.  Pt encouraged continuing HEP   Patient will benefit from continued skilled PT to improve mobility and safety.    Rehab Potential  Fair    PT Frequency  2x / week    PT  Duration  8 weeks    PT Treatment/Interventions  Aquatic Therapy;Gait training;Therapeutic exercise;Therapeutic activities;Functional mobility training;Balance training;Neuromuscular re-education;Patient/family education;Moist Heat    PT Next Visit Plan  LE strengthening, gait training    PT Home Exercise Plan  bridges, marching, hip abd/ER GTB, SLR    Consulted and Agree with Plan of Care  Patient;Other (Comment)   friend      Patient will benefit from skilled therapeutic intervention in order to improve the following deficits and impairments:  Abnormal gait, Decreased balance, Decreased endurance, Decreased mobility, Difficulty walking, Impaired UE  functional use, Decreased strength, Decreased activity tolerance  Visit Diagnosis: Muscle weakness (generalized)  Difficulty in walking, not elsewhere classified  Other lack of coordination     Problem List Patient Active Problem List   Diagnosis Date Noted  . Atherosclerosis of native arteries of the extremities with ulceration ([place]) 05/07/2017  . Non-healing ulcer ([place]) 04/16/2017  . Diabetic neuropathy ([place]) 04/07/2017  . Septic shock ([place]) 03/20/2017  . CKD (chronic kidney disease), stage III ([place]) 03/19/2017  . Goals of care, counseling/discussion 03/03/2017  . PAD (peripheral artery disease) ([place]) 02/03/2017  . Abdominal aortic stenosis 02/03/2017  . Bilateral lower extremity edema 02/03/2017  . Anemia 01/29/2017  . B12 deficiency 01/29/2017  . Pancreatic mass 01/29/2017  . Hyponatremia 07/07/2015  . Foot ulcer ([place]) 02/07/2015  . ARF (acute renal failure) ([place]) 11/05/2014  . Diabetic foot infection ([place]) 10/10/2014  . Chronic diastolic heart failure ([place]) 05/31/2014  . HTN (hypertension) 05/31/2014  . DM (diabetes mellitus), type 2, uncontrolled ([place]) 05/31/2014    Alanson Puls, [place] DPT 01/27/2018, 1:55 PM  [place] MAIN [facility] SERVICES [address] [place],  [place], [address] Phone: [phone]   Fax:  [phone]  Name: MORENE CECILIO MRN: [id number] Date of Birth: Apr 26, 1933

## 2018-01-29 ENCOUNTER — Encounter: Payer: Self-pay | Admitting: Occupational Therapy

## 2018-01-29 NOTE — Therapy (Signed)
[place] MAIN [facility] SERVICES [address] [place], [place], [address] Phone: [phone]   Fax:  [phone]  Occupational Therapy Treatment  Patient Details  Name: Sharon [name] MRN: [id number] Date of Birth: October 11, 1933 Referring Provider (OT): Emily Filbert   Encounter Date: 01/20/2018  OT End of Session - 01/29/18 1930    Visit Number  3    Number of Visits  24    Date for OT Re-Evaluation  03/24/18    Authorization Type  Medicare visit 3 of 10, reporting period starting 12/30/2017    OT Start Time  1345    OT Stop Time  1445    OT Time Calculation (min)  60 min    Activity Tolerance  Patient tolerated treatment well    Behavior During Therapy  [facility] for tasks assessed/performed       Past Medical History:  Diagnosis Date  . Anemia   . Atrial fibrillation ([place])   . Cardiac arrest ([place])   . Cataract   . CHF (congestive heart failure) ([place])   . Diabetes mellitus without complication ([place])   . Edema    feet/ankles occas  . Hip fracture ([place])   . Hyperlipidemia   . Hypertension   . Hypothyroid   . Neuropathy   . Osteomyelitis ([place])    left first metatarsal  . Stroke [facility])     Past Surgical History:  Procedure Laterality Date  . ACHILLES TENDON SURGERY Left 02/08/2015   Procedure: ACHILLES LENGTHENING/KIDNER;  Surgeon: Albertine Patricia, DPM;  Location: [facility] OR[facility];  Service: Podiatry;  Laterality: Left;  . APPENDECTOMY    . CARDIAC CATHETERIZATION  08/25/13  . CATARACT EXTRACTION W/PHACO Left 07/20/2014   Procedure: CATARACT EXTRACTION PHACO AND INTRAOCULAR LENS PLACEMENT (IOC);  Surgeon: Leandrew Koyanagi, MD;  Location: [facility] ORS;  Service: Ophthalmology;  Laterality: Left;  [place]  1:18                 AP     23.6             CDE   9.69      lot #[phone]  . CHOLECYSTECTOMY    . CORONARY ANGIOPLASTY    . HALLUX VALGUS AKIN Left 02/08/2015   Procedure: HALLUX VALGUS AKIN/ KELLER;  Surgeon: Albertine Patricia, DPM;  Location: [facility] OR[facility];   Service: Podiatry;  Laterality: Left;  IVA with Local needs 1 hour for this case   . HEMIARTHROPLASTY HIP Right   . HEMIARTHROPLASTY HIP Left   . INCISION AND DRAINAGE Left 10/11/2014   Procedure: Removal of infected tibial sessmoid;  Surgeon: Albertine Patricia, DPM;  Location: [facility] OR[facility];  Service: Podiatry;  Laterality: Left;  . IRRIGATION AND DEBRIDEMENT FOOT Right 03/21/2017   Procedure: IRRIGATION AND DEBRIDEMENT FOOT right great toe amputation;  Surgeon: Sharlotte Alamo, DPM;  Location: [facility] OR[facility];  Service: Podiatry;  Laterality: Right;  . IRRIGATION AND DEBRIDEMENT FOOT Right 04/18/2017   Procedure: IRRIGATION AND DEBRIDEMENT FOOT and application wound vac;  Surgeon: Albertine Patricia, DPM;  Location: [facility];  Service: Podiatry;  Laterality: Right;  . PERIPHERAL VASCULAR BALLOON ANGIOPLASTY Right 04/17/2017   Procedure: PERIPHERAL VASCULAR BALLOON ANGIOPLASTY;  Surgeon: Katha Cabal, MD;  Location: [facility];  Service: Cardiovascular;  Laterality: Right;    There were no vitals filed for this visit.  Subjective Assessment - 01/29/18 1926    Subjective   Patient reports she is doing well. Friend with her today. At the end of  session patient states "oh, I'm so sorry I wet the chair and I didn't realize. That doesn't usually happen!" Friend reports it does happen and she encouraged her to wear something.     Pertinent History  Pt with an extensive history since March of this year with falls, infection in her feet, great toe of R foot amputated and has been in a skilled nursing facility for many of those months with recent transition to assistive living facility.  She did not ambulate for many months and recently able to walk short distances with an assistive device, she has had multiple falls. She complains of bilateral hand pain but especially on the right side with reports of difficulty holding items, weakness and stiffness which she reports developed suddenly a few weeks ago, she reports  CT scan performed but no evidence of stroke. She does report history of arthritic changes in the hands    Patient Stated Goals  Patient reports she would like to be able to get up and walk, use right hand for self care.  Be able to write again.    Currently in Pain?  No/denies    Pain Score  0-No pain        Therex: Reassessment of grip this date on the right 5# which was up from 1# at eval.  Patient instructed on yellow putty, written/pictorial handouts issued as well as 3oz container  Of yellow putty for gross gripping, 3 point and 2 point pinch.  She demonstrates understanding  And will perform as part of HEP 1-2 times a day for 10 repetitions each.    Reaching tasks with right UE, multi directions with occasional guiding and cues from therapist.    Neuro:  Reassessment of 9 hole peg test, unable to pick up any pegs at eval but today she was able to pick Up pegs and complete test in 1 min and 45 secs.  Drops items frequently and continues to demonstrate decreased Manipulation skills to turn pegs when needed.    Self care:  At the end of session, patient realized she had an episode of incontinence and asked to go to the bathroom Min assist for transfer from wheelchair to handicapped toilet, min assist for clothing negotiation after toileting Assist for hand hygiene at the sink.                      OT Education - 01/29/18 1930    Education Details  putty exercises, self care    Person(s) Educated  Patient    Methods  Explanation;Demonstration;Verbal cues    Comprehension  Verbalized understanding;Returned demonstration;Verbal cues required          OT Long Term Goals - 01/02/18 0943      OT LONG TERM GOAL #1   Title  Patient will demonstrate increase in grip strength by 5# to be able to hold utensils in right hand for self feeding.     Baseline  currently using left hand to feed self     Time  6    Period  Weeks    Status  New    Target Date  02/10/18       OT LONG TERM GOAL #2   Title  Patient will demonstrate improved coordination to pick up small objects in right hand 1/2 inch in size.     Baseline  unable at eval    Time  12    Period  Weeks    Status  New    Target Date  03/24/18      OT LONG TERM GOAL #3   Title  Patient to perform UB dressing with modified independence     Baseline  min assist at eval    Time  6    Period  Weeks    Status  New    Target Date  02/10/18      OT LONG TERM GOAL #4   Title  Patient will complete bathing with supervision    Baseline  moderate assist at eval     Time  12    Period  Weeks    Status  New    Target Date  03/24/18      OT LONG TERM GOAL #5   Title  Patient will demonstrate HEP independently    Baseline  no current program     Time  12    Period  Weeks    Status  New    Target Date  03/24/18      Long Term Additional Goals   Additional Long Term Goals  Yes      OT LONG TERM GOAL #6   Title  Patient will demonstrate ability to hold pen and write/sign name on important papers with 75% legibility     Baseline  difficulty holding pen and legiblity less than 25% at eval    Time  12    Period  Weeks    Status  New    Target Date  03/24/18            Plan - 01/29/18 1931    Clinical Impression Statement  Patient continues to demonstrate progress with right hand and now able to perform light gripping.  She continues to require increased assistance with daily self care tasks and benefits from  continued skilled OT.  Encouraged patient to go to the bathroom often and ask therapists if she needs to go during therapy appt and not wait.      Occupational Profile and client history currently impacting functional performance  History of falls, currently being seen by PT, toe amputation, balance, arthritic changes in bilateral UEs, lives alone but currently in assistive living    Occupational performance deficits (Please refer to evaluation for details):  ADL's;IADL's;Social  Participation    Rehab Potential  Good    Current Impairments/barriers affecting progress:  significant weakness in R hand compared to left, balance, fall risk    OT Frequency  2x / week    OT Duration  12 weeks    OT Treatment/Interventions  Self-care/ADL training;Therapeutic exercise;Moist Heat;Neuromuscular education;Splinting;Patient/family education;Therapeutic activities;Balance training;Contrast Bath;DME and/or AE instruction;Manual Therapy    Consulted and Agree with Plan of Care  Patient       Patient will benefit from skilled therapeutic intervention in order to improve the following deficits and impairments:  Abnormal gait, Decreased knowledge of use of DME, Impaired flexibility, Pain, Decreased coordination, Decreased mobility, Decreased activity tolerance, Decreased endurance, Decreased range of motion, Decreased strength, Decreased balance, Difficulty walking, Impaired UE functional use  Visit Diagnosis: Muscle weakness (generalized)  Other lack of coordination    Problem List Patient Active Problem List   Diagnosis Date Noted  . Atherosclerosis of native arteries of the extremities with ulceration ([place]) 05/07/2017  . Non-healing ulcer ([place]) 04/16/2017  . Diabetic neuropathy ([place]) 04/07/2017  . Septic shock ([place]) 03/20/2017  . CKD (chronic kidney disease), stage III ([place]) 03/19/2017  . Goals of care, counseling/discussion 03/03/2017  .  PAD (peripheral artery disease) ([place]) 02/03/2017  . Abdominal aortic stenosis 02/03/2017  . Bilateral lower extremity edema 02/03/2017  . Anemia 01/29/2017  . B12 deficiency 01/29/2017  . Pancreatic mass 01/29/2017  . Hyponatremia 07/07/2015  . Foot ulcer ([place]) 02/07/2015  . ARF (acute renal failure) ([place]) 11/05/2014  . Diabetic foot infection ([place]) 10/10/2014  . Chronic diastolic heart failure ([place]) 05/31/2014  . HTN (hypertension) 05/31/2014  . DM (diabetes mellitus), type 2, uncontrolled ([place]) 05/31/2014   Amy T Lovett, OTR/L,  CLT  Lovett,Amy 01/29/2018, 7:35 PM  [place] MAIN [facility] SERVICES [address] [place], [place], [address] Phone: [phone]   Fax:  [phone]  Name: Sharon [name] MRN: [id number] Date of Birth: 08-12-1933

## 2018-01-30 ENCOUNTER — Encounter: Payer: Self-pay | Admitting: Occupational Therapy

## 2018-01-30 NOTE — Therapy (Signed)
[place] MAIN [facility] SERVICES [address] [place], [place], [address] Phone: [phone]   Fax:  [phone]  Occupational Therapy Treatment  Patient Details  Name: Sharon [name] MRN: [id number] Date of Birth: 1933/09/13 Referring Provider (OT): Emily Filbert   Encounter Date: 01/27/2018  OT End of Session - 01/30/18 1055    Visit Number  4    Number of Visits  24    Date for OT Re-Evaluation  03/24/18    Authorization Type  Medicare visit 4 of 10, reporting period starting 12/30/2017    OT Start Time  1351    OT Stop Time  1430    OT Time Calculation (min)  39 min    Activity Tolerance  Patient tolerated treatment well    Behavior During Therapy  [facility] for tasks assessed/performed       Past Medical History:  Diagnosis Date  . Anemia   . Atrial fibrillation ([place])   . Cardiac arrest ([place])   . Cataract   . CHF (congestive heart failure) ([place])   . Diabetes mellitus without complication ([place])   . Edema    feet/ankles occas  . Hip fracture ([place])   . Hyperlipidemia   . Hypertension   . Hypothyroid   . Neuropathy   . Osteomyelitis ([place])    left first metatarsal  . Stroke [facility])     Past Surgical History:  Procedure Laterality Date  . ACHILLES TENDON SURGERY Left 02/08/2015   Procedure: ACHILLES LENGTHENING/KIDNER;  Surgeon: Albertine Patricia, DPM;  Location: [facility] ORS;  Service: Podiatry;  Laterality: Left;  . APPENDECTOMY    . CARDIAC CATHETERIZATION  08/25/13  . CATARACT EXTRACTION W/PHACO Left 07/20/2014   Procedure: CATARACT EXTRACTION PHACO AND INTRAOCULAR LENS PLACEMENT (IOC);  Surgeon: Leandrew Koyanagi, MD;  Location: [facility] ORS;  Service: Ophthalmology;  Laterality: Left;  [place]  1:18                 AP     23.6             CDE   9.69      lot #[id number]  . CHOLECYSTECTOMY    . CORONARY ANGIOPLASTY    . HALLUX VALGUS AKIN Left 02/08/2015   Procedure: HALLUX VALGUS AKIN/ KELLER;  Surgeon: Albertine Patricia, DPM;  Location: [facility]  ORS;  Service: Podiatry;  Laterality: Left;  IVA with Local needs 1 hour for this case   . HEMIARTHROPLASTY HIP Right   . HEMIARTHROPLASTY HIP Left   . INCISION AND DRAINAGE Left 10/11/2014   Procedure: Removal of infected tibial sessmoid;  Surgeon: Albertine Patricia, DPM;  Location: [facility] ORS;  Service: Podiatry;  Laterality: Left;  . IRRIGATION AND DEBRIDEMENT FOOT Right 03/21/2017   Procedure: IRRIGATION AND DEBRIDEMENT FOOT right great toe amputation;  Surgeon: Sharlotte Alamo, DPM;  Location: [facility] ORS;  Service: Podiatry;  Laterality: Right;  . IRRIGATION AND DEBRIDEMENT FOOT Right 04/18/2017   Procedure: IRRIGATION AND DEBRIDEMENT FOOT and application wound vac;  Surgeon: Albertine Patricia, DPM;  Location: [facility] ORS;  Service: Podiatry;  Laterality: Right;  . PERIPHERAL VASCULAR BALLOON ANGIOPLASTY Right 04/17/2017   Procedure: PERIPHERAL VASCULAR BALLOON ANGIOPLASTY;  Surgeon: Katha Cabal, MD;  Location: [place] CV LAB;  Service: Cardiovascular;  Laterality: Right;    There were no vitals filed for this visit.  Subjective Assessment - 01/30/18 1053    Subjective   Patient reports she has been working on her exercises and can see a difference  in her right hand but it still is not functional.  She continues to demonstrate difficulty with self feeding skills.     Pertinent History  Pt with an extensive history since March of this year with falls, infection in her feet, great toe of R foot amputated and has been in a skilled nursing facility for many of those months with recent transition to assistive living facility.  She did not ambulate for many months and recently able to walk short distances with an assistive device, she has had multiple falls. She complains of bilateral hand pain but especially on the right side with reports of difficulty holding items, weakness and stiffness which she reports developed suddenly a few weeks ago, she reports CT scan performed but no evidence of stroke. She  does report history of arthritic changes in the hands    Patient Stated Goals  Patient reports she would like to be able to get up and walk, use right hand for self care.  Be able to write again.    Currently in Pain?  No/denies    Pain Score  0-No pain        ADL:  Patient seen this date for hand to mouth patterns with utensils using built up red foam handles, able to demonstrate pattern  But has difficulty with keeping items on fork or spoon with hand to mouth pattern.  Patient is unable to perform without foam.   Handwriting skills, patient able to grossly hold large grip pen and form the letters of her name in print, legibility poor. Cues for tripod grasp  On pen.    Neuromuscular Reeducation:  Patient seen this date for focus on prehension patterns to pick up ball pegs to place into grid.  Unable to pick up from container and turn to put into grid, but if offered the item, patient can take from therapist and place into grid. Continues to lack isolated finger movements for manipulation skills.     Therex: 1# dowel exercises for shoulder press, chest press, ABD/ADD, forwards and backwards circles for 5-10 repetitions each for 2 sets. Patient requires cues from therapist for technique and therapist providing assist to adjust grip on right as needed.                     OT Education - 01/30/18 1055    Education Details  putty exercises, self feeding skills    Person(s) Educated  Patient    Methods  Explanation;Demonstration;Verbal cues    Comprehension  Verbalized understanding;Returned demonstration;Verbal cues required          OT Long Term Goals - 01/02/18 0943      OT LONG TERM GOAL #1   Title  Patient will demonstrate increase in grip strength by 5# to be able to hold utensils in right hand for self feeding.     Baseline  currently using left hand to feed self     Time  6    Period  Weeks    Status  New    Target Date  02/10/18      OT LONG TERM GOAL  #2   Title  Patient will demonstrate improved coordination to pick up small objects in right hand 1/2 inch in size.     Baseline  unable at eval    Time  12    Period  Weeks    Status  New    Target Date  03/24/18  OT LONG TERM GOAL #3   Title  Patient to perform UB dressing with modified independence     Baseline  min assist at eval    Time  6    Period  Weeks    Status  New    Target Date  02/10/18      OT LONG TERM GOAL #4   Title  Patient will complete bathing with supervision    Baseline  moderate assist at eval     Time  12    Period  Weeks    Status  New    Target Date  03/24/18      OT LONG TERM GOAL #5   Title  Patient will demonstrate HEP independently    Baseline  no current program     Time  12    Period  Weeks    Status  New    Target Date  03/24/18      Long Term Additional Goals   Additional Long Term Goals  Yes      OT LONG TERM GOAL #6   Title  Patient will demonstrate ability to hold pen and write/sign name on important papers with 75% legibility     Baseline  difficulty holding pen and legiblity less than 25% at eval    Time  12    Period  Weeks    Status  New    Target Date  03/24/18            Plan - 01/30/18 1055    Clinical Impression Statement  Patient has continued to show progress with right hand strength over the last few sessions, she continues to lack coordination and lacks the ability to manipulate objects with the right hand.  She has been able to perform some finger food self feeding and working towards goal of using utensils for self feeding with the right hand.  Continue to work towards goals to increase independence in daily tasks.     Occupational Profile and client history currently impacting functional performance  History of falls, currently being seen by PT, toe amputation, balance, arthritic changes in bilateral UEs, lives alone but currently in assistive living    Occupational performance deficits (Please refer to  evaluation for details):  ADL's;IADL's;Social Participation    Rehab Potential  Good    Current Impairments/barriers affecting progress:  significant weakness in R hand compared to left, balance, fall risk    OT Frequency  2x / week    OT Duration  12 weeks    OT Treatment/Interventions  Self-care/ADL training;Therapeutic exercise;Moist Heat;Neuromuscular education;Splinting;Patient/family education;Therapeutic activities;Balance training;Contrast Bath;DME and/or AE instruction;Manual Therapy    Consulted and Agree with Plan of Care  Patient       Patient will benefit from skilled therapeutic intervention in order to improve the following deficits and impairments:  Abnormal gait, Decreased knowledge of use of DME, Impaired flexibility, Pain, Decreased coordination, Decreased mobility, Decreased activity tolerance, Decreased endurance, Decreased range of motion, Decreased strength, Decreased balance, Difficulty walking, Impaired UE functional use  Visit Diagnosis: Muscle weakness (generalized)  Other lack of coordination    Problem List Patient Active Problem List   Diagnosis Date Noted  . Atherosclerosis of native arteries of the extremities with ulceration ([place]) 05/07/2017  . Non-healing ulcer ([place]) 04/16/2017  . Diabetic neuropathy ([place]) 04/07/2017  . Septic shock ([place]) 03/20/2017  . CKD (chronic kidney disease), stage III ([place]) 03/19/2017  . Goals of care, counseling/discussion 03/03/2017  . PAD (peripheral  artery disease) ([place]) 02/03/2017  . Abdominal aortic stenosis 02/03/2017  . Bilateral lower extremity edema 02/03/2017  . Anemia 01/29/2017  . B12 deficiency 01/29/2017  . Pancreatic mass 01/29/2017  . Hyponatremia 07/07/2015  . Foot ulcer ([place]) 02/07/2015  . ARF (acute renal failure) ([place]) 11/05/2014  . Diabetic foot infection ([place]) 10/10/2014  . Chronic diastolic heart failure ([place]) 05/31/2014  . HTN (hypertension) 05/31/2014  . DM (diabetes mellitus), type 2,  uncontrolled ([place]) 05/31/2014   Aarik Blank T Moksha Dorgan, OTR/L, CLT  Neeko Pharo 01/30/2018, 11:01 AM  [place] MAIN [facility] SERVICES [place], [place], [address] Phone: [phone]   Fax:  [phone]  Name: Sharon [name] MRN: [id number] Date of Birth: Jun 15, 1933

## 2018-02-01 ENCOUNTER — Encounter: Payer: Self-pay | Admitting: Physical Therapy

## 2018-02-01 ENCOUNTER — Ambulatory Visit: Payer: PPO | Admitting: Physical Therapy

## 2018-02-01 DIAGNOSIS — M6281 Muscle weakness (generalized): Secondary | ICD-10-CM

## 2018-02-01 DIAGNOSIS — R262 Difficulty in walking, not elsewhere classified: Secondary | ICD-10-CM

## 2018-02-01 DIAGNOSIS — R278 Other lack of coordination: Secondary | ICD-10-CM

## 2018-02-01 NOTE — Therapy (Signed)
[place] MAIN [facility] SERVICES [address] [place], [place], [address] Phone: [phone]   Fax:  [phone]  Physical Therapy Treatment  Patient Details  Name: Sharon [name] MRN: [id number] Date of Birth: [birth year] Referring Provider (PT): [place], [place]   Encounter Date: 02/01/2018  PT End of Session - 02/01/18 1321    Visit Number  12    Number of Visits  17    Date for PT Re-Evaluation  03/22/18    Authorization Type  2/10    PT Start Time  0102    PT Stop Time  0145    PT Time Calculation (min)  43 min    Equipment Utilized During Treatment  Gait belt    Activity Tolerance  Patient tolerated treatment well;Patient limited by fatigue       Past Medical History:  Diagnosis Date  . Anemia   . Atrial fibrillation ([place])   . Cardiac arrest ([place])   . Cataract   . CHF (congestive heart failure) ([place])   . Diabetes mellitus without complication ([place])   . Edema    feet/ankles occas  . Hip fracture ([place])   . Hyperlipidemia   . Hypertension   . Hypothyroid   . Neuropathy   . Osteomyelitis ([place])    left first metatarsal  . Stroke [facility])     Past Surgical History:  Procedure Laterality Date  . ACHILLES TENDON SURGERY Left 02/08/2015   Procedure: ACHILLES LENGTHENING/KIDNER;  Surgeon: Albertine Patricia, DPM;  Location: [facility] ORS;  Service: Podiatry;  Laterality: Left;  . APPENDECTOMY    . CARDIAC CATHETERIZATION  08/25/13  . CATARACT EXTRACTION W/PHACO Left 07/20/2014   Procedure: CATARACT EXTRACTION PHACO AND INTRAOCULAR LENS PLACEMENT (IOC);  Surgeon: Leandrew Koyanagi, MD;  Location: [facility] ORS;  Service: Ophthalmology;  Laterality: Left;  [place]  1:18                 AP     23.6             CDE   9.69      lot #[phone]  . CHOLECYSTECTOMY    . CORONARY ANGIOPLASTY    . HALLUX VALGUS AKIN Left 02/08/2015   Procedure: HALLUX VALGUS AKIN/ KELLER;  Surgeon: Albertine Patricia, DPM;  Location: [facility] ORS;  Service: Podiatry;  Laterality:  Left;  IVA with Local needs 1 hour for this case   . HEMIARTHROPLASTY HIP Right   . HEMIARTHROPLASTY HIP Left   . INCISION AND DRAINAGE Left 10/11/2014   Procedure: Removal of infected tibial sessmoid;  Surgeon: Albertine Patricia, DPM;  Location: [facility] ORS;  Service: Podiatry;  Laterality: Left;  . IRRIGATION AND DEBRIDEMENT FOOT Right 03/21/2017   Procedure: IRRIGATION AND DEBRIDEMENT FOOT right great toe amputation;  Surgeon: Sharlotte Alamo, DPM;  Location: [facility] ORS;  Service: Podiatry;  Laterality: Right;  . IRRIGATION AND DEBRIDEMENT FOOT Right 04/18/2017   Procedure: IRRIGATION AND DEBRIDEMENT FOOT and application wound vac;  Surgeon: Albertine Patricia, DPM;  Location: [facility] ORS;  Service: Podiatry;  Laterality: Right;  . PERIPHERAL VASCULAR BALLOON ANGIOPLASTY Right 04/17/2017   Procedure: PERIPHERAL VASCULAR BALLOON ANGIOPLASTY;  Surgeon: Katha Cabal, MD;  Location: [place] CV LAB;  Service: Cardiovascular;  Laterality: Right;    There were no vitals filed for this visit.  Subjective Assessment - 02/01/18 1320    Subjective  Pt denies any new falls; reports doing HEP . has not had any new falls;     Pertinent History  Patient stopped walking in march. She had a fall at home and she went to the ER and she had infections in bilateral feet. she had her great toe R foot ampuated and she had a wound on L foot. She went to the hospital and then to [facility]. She went back to the MD and had a second surgery in March and then went to another SNF for 3-4 months. She was not able to walk due to being non weight bearing. She was discharged to [facility] an assisted living facility. she had PT 2 x / week. She was able to walk short distances with RW.  She is able to dress herself, she takes herself to the bathroom with assist. She has had several falls.     Limitations  Walking    How long can you stand comfortably?  15 mins if she is holding on    Patient Stated Goals  She wants to walk again.      Currently in Pain?  No/denies    Pain Score  0-No pain         Treatment:  Nu-step x 5 mins , BUE and BLE  Gait with Rw 100 feet with poor positioning and bilat. Knees flexed with min assist and grip on her belt due to hx of knees buckling  Standing at mat level and RW with poor knee extension and partial flex of bilateral knees for 8 minutes  Patient needed to use the commode and needs min assist for transfers wc<>commode, assist for donning her clothing  Sit to stand to RW  From mat level  x 10 , with cga and cues for safety and sequencing  Patient has no reports of pain and has difficulty with getting and keeping her knees straight .and fully extended during transfers and standing and walking.    Pt requires consistent encouragement during session and struggles with B knee weakness and inability to stand with her knees in extension for short periods of time.                     PT Education - 02/01/18 1320    Education Details  standing with wc against the wall and use of RW    Person(s) Educated  Patient;Other (comment)   friend   Methods  Explanation;Demonstration;Tactile cues    Comprehension  Verbalized understanding;Returned demonstration       PT Short Term Goals - 12/30/17 1152      PT SHORT TERM GOAL #1   Title  Patient will be independent in home exercise program to improve strength/mobility for better functional independence with ADLs.    Baseline  inconsistent    Time  4    Period  Weeks    Status  Partially Met    Target Date  12/28/17      PT SHORT TERM GOAL #2   Title  Patient will transfer from wc to bed with SBA and RW.    Baseline  needs min assist , able to complete stand pivot transfer with RW SBA    Time  4    Period  Weeks    Status  Achieved    Target Date  12/28/17      PT SHORT TERM GOAL #3   Title  Patient will improve her 8 MW test from . 2 m/sec to . 5 m/sec with RW    Baseline  . 2 m/sec, 12/30/17: 0.2 m/s    Time  4    Period  Weeks    Status  Not Met    Target Date  12/28/17        PT Long Term Goals - 01/25/18 1311      PT LONG TERM GOAL #1   Title  Patient will be able to perform 5 sit to stand transfers with RW using her hands to push up from a 17 inch chair    Baseline  performs sit to stand transfer with UE assist from 20 inch wc 5 x in 37 seconds1/13/20=38.58sec from 18 inch chair    Time  8    Period  Weeks    Status  Partially Met    Target Date  03/22/18      PT LONG TERM GOAL #2   Title  Patient will be able to stand x 10 mins with SBA to be able to perform standing activiites at the counter    Baseline  patient is able to stand for 6  min, 01/25/18= 7 15 seconds    Time  8    Period  Weeks    Status  Partially Met    Target Date  03/22/18      PT LONG TERM GOAL #3   Title  Patient will be able to ambulate with RW and SBA  assist x 200 feet     Baseline  Ambulates with min assist and wc following 100 feet 01/25/18= 125 feet with RW and SBA    Time  8    Period  Weeks    Status  Partially Met    Target Date  03/22/18      PT LONG TERM GOAL #4   Title  patient will impvoe BLE hip strength to 4/5 to improve mobility and safety    Baseline  01/25/18=2/5 hip abd and add,, 3/5 hip flex, unable to test hip ext    Time  8    Period  Weeks    Status  Partially Met    Target Date  03/22/18      PT LONG TERM GOAL #5   Title  Patient will improve static standing balance from poor plus to fair and be able to reach for objects on the counter top.    Baseline  poor plus static standing balance, poor plus standing balance 12/18,  01/25/18 poor plus     Time  8    Period  Weeks    Status  Not Met    Target Date  03/22/18            Plan - 02/01/18 1322    Clinical Impression Statement  Pt requires direction and verbal cues for correct performance of exercises. Patient demonstrates weakness in BLE and performs gait and transfers from wc level with RW.  Pt was able to perform  standing exercise with SBA assist and mod VC for technique.  Pt encouraged continuing  HEP including standing and timing herself with her RW.     Rehab Potential  Fair    PT Frequency  2x / week    PT Duration  8 weeks    PT Treatment/Interventions  Aquatic Therapy;Gait training;Therapeutic exercise;Therapeutic activities;Functional mobility training;Balance training;Neuromuscular re-education;Patient/family education;Moist Heat    PT Next Visit Plan  LE strengthening, gait training    PT Home Exercise Plan  bridges, marching, hip abd/ER GTB, SLR    Consulted and Agree with Plan of Care  Patient;Other (Comment)   friend  Patient will benefit from skilled therapeutic intervention in order to improve the following deficits and impairments:  Abnormal gait, Decreased balance, Decreased endurance, Decreased mobility, Difficulty walking, Impaired UE functional use, Decreased strength, Decreased activity tolerance  Visit Diagnosis: Muscle weakness (generalized)  Other lack of coordination  Difficulty in walking, not elsewhere classified     Problem List Patient Active Problem List   Diagnosis Date Noted  . Atherosclerosis of native arteries of the extremities with ulceration ([place]) 05/07/2017  . Non-healing ulcer ([place]) 04/16/2017  . Diabetic neuropathy ([place]) 04/07/2017  . Septic shock ([place]) 03/20/2017  . CKD (chronic kidney disease), stage III ([place]) 03/19/2017  . Goals of care, counseling/discussion 03/03/2017  . PAD (peripheral artery disease) ([place]) 02/03/2017  . Abdominal aortic stenosis 02/03/2017  . Bilateral lower extremity edema 02/03/2017  . Anemia 01/29/2017  . B12 deficiency 01/29/2017  . Pancreatic mass 01/29/2017  . Hyponatremia 07/07/2015  . Foot ulcer ([place]) 02/07/2015  . ARF (acute renal failure) ([place]) 11/05/2014  . Diabetic foot infection ([place]) 10/10/2014  . Chronic diastolic heart failure ([place]) 05/31/2014  . HTN (hypertension) 05/31/2014  . DM (diabetes  mellitus), type 2, uncontrolled ([place]) 05/31/2014    Arelia Sneddon SPT DPT 02/01/2018, 1:25 PM  [place] MAIN [facility] SERVICES [address] [place], [place], [address] Phone: [phone]   Fax:  [phone]  Name: Sharon [name] MRN: [id number] Date of Birth: 03-02-1933

## 2018-02-02 ENCOUNTER — Other Ambulatory Visit: Payer: Self-pay | Admitting: Internal Medicine

## 2018-02-02 ENCOUNTER — Other Ambulatory Visit (HOSPITAL_COMMUNITY): Payer: Self-pay | Admitting: Internal Medicine

## 2018-02-02 DIAGNOSIS — E871 Hypo-osmolality and hyponatremia: Secondary | ICD-10-CM | POA: Diagnosis not present

## 2018-02-02 DIAGNOSIS — G8191 Hemiplegia, unspecified affecting right dominant side: Secondary | ICD-10-CM | POA: Diagnosis not present

## 2018-02-02 DIAGNOSIS — R600 Localized edema: Secondary | ICD-10-CM | POA: Diagnosis not present

## 2018-02-02 DIAGNOSIS — R270 Ataxia, unspecified: Secondary | ICD-10-CM | POA: Diagnosis not present

## 2018-02-03 ENCOUNTER — Encounter: Payer: Self-pay | Admitting: Occupational Therapy

## 2018-02-03 ENCOUNTER — Encounter: Payer: Self-pay | Admitting: Physical Therapy

## 2018-02-03 ENCOUNTER — Ambulatory Visit: Payer: PPO | Admitting: Physical Therapy

## 2018-02-03 ENCOUNTER — Ambulatory Visit: Payer: PPO | Admitting: Occupational Therapy

## 2018-02-03 DIAGNOSIS — R278 Other lack of coordination: Secondary | ICD-10-CM

## 2018-02-03 DIAGNOSIS — M6281 Muscle weakness (generalized): Secondary | ICD-10-CM

## 2018-02-03 DIAGNOSIS — R262 Difficulty in walking, not elsewhere classified: Secondary | ICD-10-CM

## 2018-02-03 NOTE — Therapy (Signed)
[place] MAIN [facility] SERVICES [address] [place], [place], [address] Phone: [phone]   Fax:  [phone]  Physical Therapy Treatment  Patient Details  Name: Sharon [name] MRN: [id number] Date of Birth: [birth year] Referring Provider (PT): Pepper Pike, [place]   Encounter Date: 02/03/2018  PT End of Session - 02/03/18 1337    Visit Number  13    Number of Visits  17    Date for PT Re-Evaluation  03/22/18    Authorization Type  3/10    PT Start Time  0145    PT Stop Time  0225    PT Time Calculation (min)  40 min    Equipment Utilized During Treatment  Gait belt    Activity Tolerance  Patient tolerated treatment well;Patient limited by fatigue       Past Medical History:  Diagnosis Date  . Anemia   . Atrial fibrillation ([place])   . Cardiac arrest ([place])   . Cataract   . CHF (congestive heart failure) ([place])   . Diabetes mellitus without complication ([place])   . Edema    feet/ankles occas  . Hip fracture ([place])   . Hyperlipidemia   . Hypertension   . Hypothyroid   . Neuropathy   . Osteomyelitis ([place])    left first metatarsal  . Stroke [facility])     Past Surgical History:  Procedure Laterality Date  . ACHILLES TENDON SURGERY Left 02/08/2015   Procedure: ACHILLES LENGTHENING/KIDNER;  Surgeon: Albertine Patricia, DPM;  Location: [facility] ORS;  Service: Podiatry;  Laterality: Left;  . APPENDECTOMY    . CARDIAC CATHETERIZATION  08/25/13  . CATARACT EXTRACTION W/PHACO Left 07/20/2014   Procedure: CATARACT EXTRACTION PHACO AND INTRAOCULAR LENS PLACEMENT (IOC);  Surgeon: Leandrew Koyanagi, MD;  Location: [facility] ORS;  Service: Ophthalmology;  Laterality: Left;  [place]  1:18                 AP     23.6             CDE   9.69      lot #[phone]  . CHOLECYSTECTOMY    . CORONARY ANGIOPLASTY    . HALLUX VALGUS AKIN Left 02/08/2015   Procedure: HALLUX VALGUS AKIN/ KELLER;  Surgeon: Albertine Patricia, DPM;  Location: [facility] ORS;  Service: Podiatry;  Laterality:  Left;  IVA with Local needs 1 hour for this case   . HEMIARTHROPLASTY HIP Right   . HEMIARTHROPLASTY HIP Left   . INCISION AND DRAINAGE Left 10/11/2014   Procedure: Removal of infected tibial sessmoid;  Surgeon: Albertine Patricia, DPM;  Location: [facility] ORS;  Service: Podiatry;  Laterality: Left;  . IRRIGATION AND DEBRIDEMENT FOOT Right 03/21/2017   Procedure: IRRIGATION AND DEBRIDEMENT FOOT right great toe amputation;  Surgeon: Sharlotte Alamo, DPM;  Location: [facility] ORS;  Service: Podiatry;  Laterality: Right;  . IRRIGATION AND DEBRIDEMENT FOOT Right 04/18/2017   Procedure: IRRIGATION AND DEBRIDEMENT FOOT and application wound vac;  Surgeon: Albertine Patricia, DPM;  Location: [facility] ORS;  Service: Podiatry;  Laterality: Right;  . PERIPHERAL VASCULAR BALLOON ANGIOPLASTY Right 04/17/2017   Procedure: PERIPHERAL VASCULAR BALLOON ANGIOPLASTY;  Surgeon: Katha Cabal, MD;  Location: [place] CV LAB;  Service: Cardiovascular;  Laterality: Right;    There were no vitals filed for this visit.  Subjective Assessment - 02/03/18 1336    Subjective  Pt denies any new falls; reports doing HEP . has not had any new falls;     Pertinent History  Patient stopped walking in march. She had a fall at home and she went to the ER and she had infections in bilateral feet. she had her great toe R foot ampuated and she had a wound on L foot. She went to the hospital and then to [facility]. She went back to the MD and had a second surgery in March and then went to another SNF for 3-4 months. She was not able to walk due to being non weight bearing. She was discharged to [facility] an assisted living facility. she had PT 2 x / week. She was able to walk short distances with RW.  She is able to dress herself, she takes herself to the bathroom with assist. She has had several falls.     Limitations  Walking    How long can you stand comfortably?  15 mins if she is holding on    Patient Stated Goals  She wants to walk again.      Currently in Pain?  No/denies    Pain Score  0-No pain    Pain Onset  1 to 4 weeks ago         Treatment:  Nu-step x 5 mins , BUE and BLE  LAQ with 2 lbs on LLE, no weight on RLE x 20 x 2  Seated hip flex x 20 x 2 BLE  Gait with Rw 40 feet x 4 with poor positioning and bilat. Knees flexed with min assist and grip on her belt due to hx of knees buckling  Standing at mat level and RW with poor knee extension and partial flex of bilateral knees for 7 minutes, 6 mins  Patient needed to use the commode and needs min assist for transfers wc<>commode, assist for donning her clothing  Sit to stand to RW  From mat level  x 10 , with cga and cues for safety and sequencing  Patient has no reports of pain and has difficulty with getting and keeping her knees straight .and fully extended during transfers and standing and walking.  Pt requires consistent encouragement during session and struggles with B knee weakness and inability to stand with her knees in extension for short periods of time.                       PT Education - 02/03/18 1336    Education Details  HEP    Person(s) Educated  Patient    Methods  Explanation    Comprehension  Verbalized understanding;Returned demonstration;Need further instruction       PT Short Term Goals - 12/30/17 1152      PT SHORT TERM GOAL #1   Title  Patient will be independent in home exercise program to improve strength/mobility for better functional independence with ADLs.    Baseline  inconsistent    Time  4    Period  Weeks    Status  Partially Met    Target Date  12/28/17      PT SHORT TERM GOAL #2   Title  Patient will transfer from wc to bed with SBA and RW.    Baseline  needs min assist , able to complete stand pivot transfer with RW SBA    Time  4    Period  Weeks    Status  Achieved    Target Date  12/28/17      PT SHORT TERM GOAL #3   Title  Patient will improve her  10 MW test from . 2 m/sec to . 5  m/sec with RW    Baseline  . 2 m/sec, 12/30/17: 0.2 m/s    Time  4    Period  Weeks    Status  Not Met    Target Date  12/28/17        PT Long Term Goals - 01/25/18 1311      PT LONG TERM GOAL #1   Title  Patient will be able to perform 5 sit to stand transfers with RW using her hands to push up from a 17 inch chair    Baseline  performs sit to stand transfer with UE assist from 20 inch wc 5 x in 37 seconds1/13/20=38.58sec from 18 inch chair    Time  8    Period  Weeks    Status  Partially Met    Target Date  03/22/18      PT LONG TERM GOAL #2   Title  Patient will be able to stand x 10 mins with SBA to be able to perform standing activiites at the counter    Baseline  patient is able to stand for 6  min, 01/25/18= 7 15 seconds    Time  8    Period  Weeks    Status  Partially Met    Target Date  03/22/18      PT LONG TERM GOAL #3   Title  Patient will be able to ambulate with RW and SBA  assist x 200 feet     Baseline  Ambulates with min assist and wc following 100 feet 01/25/18= 125 feet with RW and SBA    Time  8    Period  Weeks    Status  Partially Met    Target Date  03/22/18      PT LONG TERM GOAL #4   Title  patient will impvoe BLE hip strength to 4/5 to improve mobility and safety    Baseline  01/25/18=2/5 hip abd and add,, 3/5 hip flex, unable to test hip ext    Time  8    Period  Weeks    Status  Partially Met    Target Date  03/22/18      PT LONG TERM GOAL #5   Title  Patient will improve static standing balance from poor plus to fair and be able to reach for objects on the counter top.    Baseline  poor plus static standing balance, poor plus standing balance 12/18,  01/25/18 poor plus     Time  8    Period  Weeks    Status  Not Met    Target Date  03/22/18            Plan - 02/03/18 1337    Clinical Impression Statement  Patient has flexed knees and hips during standing with Rw and has poor stepping quality during gait. She has weakness in BLE R>L  , decreased transfers from wc <>mat, and decreased standing time. She will conitnue to benefit from skilled PT to improve saftey and mobility.    Rehab Potential  Fair    PT Frequency  2x / week    PT Duration  8 weeks    PT Treatment/Interventions  Aquatic Therapy;Gait training;Therapeutic exercise;Therapeutic activities;Functional mobility training;Balance training;Neuromuscular re-education;Patient/family education;Moist Heat    PT Next Visit Plan  LE strengthening, gait training    PT Home Exercise Plan  bridges, marching, hip abd/ER GTB, SLR  Consulted and Agree with Plan of Care  Patient;Other (Comment)   friend      Patient will benefit from skilled therapeutic intervention in order to improve the following deficits and impairments:  Abnormal gait, Decreased balance, Decreased endurance, Decreased mobility, Difficulty walking, Impaired UE functional use, Decreased strength, Decreased activity tolerance  Visit Diagnosis: Muscle weakness (generalized)  Other lack of coordination  Difficulty in walking, not elsewhere classified     Problem List Patient Active Problem List   Diagnosis Date Noted  . Atherosclerosis of native arteries of the extremities with ulceration ([place]) 05/07/2017  . Non-healing ulcer ([place]) 04/16/2017  . Diabetic neuropathy ([place]) 04/07/2017  . Septic shock ([place]) 03/20/2017  . CKD (chronic kidney disease), stage III ([place]) 03/19/2017  . Goals of care, counseling/discussion 03/03/2017  . PAD (peripheral artery disease) ([place]) 02/03/2017  . Abdominal aortic stenosis 02/03/2017  . Bilateral lower extremity edema 02/03/2017  . Anemia 01/29/2017  . B12 deficiency 01/29/2017  . Pancreatic mass 01/29/2017  . Hyponatremia 07/07/2015  . Foot ulcer ([place]) 02/07/2015  . ARF (acute renal failure) ([place]) 11/05/2014  . Diabetic foot infection ([place]) 10/10/2014  . Chronic diastolic heart failure ([place]) 05/31/2014  . HTN (hypertension) 05/31/2014  . DM (diabetes  mellitus), type 2, uncontrolled ([place]) 05/31/2014    Alanson Puls, [place] DPT 02/03/2018, 3:07 PM  [place] MAIN [facility] SERVICES [address] [place], [place], [address] Phone: [phone]   Fax:  [phone]  Name: NAFEESAH LAPAGLIA MRN: [id number] Date of Birth: 1933-06-05

## 2018-02-05 ENCOUNTER — Ambulatory Visit
Admission: RE | Admit: 2018-02-05 | Discharge: 2018-02-05 | Disposition: A | Discharge status: Home / Self Care | Payer: PPO | Source: Ambulatory Visit | Attending: Internal Medicine | Admitting: Internal Medicine

## 2018-02-05 DIAGNOSIS — R270 Ataxia, unspecified: Secondary | ICD-10-CM

## 2018-02-05 DIAGNOSIS — R531 Weakness: Secondary | ICD-10-CM | POA: Diagnosis not present

## 2018-02-08 ENCOUNTER — Ambulatory Visit: Payer: PPO

## 2018-02-08 ENCOUNTER — Encounter: Payer: Self-pay | Admitting: Occupational Therapy

## 2018-02-08 ENCOUNTER — Ambulatory Visit: Payer: PPO | Admitting: Occupational Therapy

## 2018-02-08 DIAGNOSIS — M6281 Muscle weakness (generalized): Secondary | ICD-10-CM

## 2018-02-08 DIAGNOSIS — R262 Difficulty in walking, not elsewhere classified: Secondary | ICD-10-CM

## 2018-02-08 DIAGNOSIS — R278 Other lack of coordination: Secondary | ICD-10-CM

## 2018-02-08 NOTE — Therapy (Signed)
[place] MAIN [facility] SERVICES [address] [place], [place], [address] Phone: [phone]   Fax:  [phone]  Occupational Therapy Treatment  Patient Details  Name: BUELAH RENNIE MRN: [id number] Date of Birth: [birth year] Referring Provider (OT): Emily Filbert   Encounter Date: 02/08/2018  OT End of Session - 02/10/18 0093    Visit Number  6    Number of Visits  24    Date for OT Re-Evaluation  03/24/18    Authorization Type  Medicare visit 6 of 10, reporting period starting 12/30/2017    OT Start Time  1345    OT Stop Time  1430    OT Time Calculation (min)  45 min    Activity Tolerance  Patient tolerated treatment well    Behavior During Therapy  [facility] for tasks assessed/performed       Past Medical History:  Diagnosis Date  . Anemia   . Atrial fibrillation ([place])   . Cardiac arrest ([place])   . Cataract   . CHF (congestive heart failure) ([place])   . Diabetes mellitus without complication ([place])   . Edema    feet/ankles occas  . Hip fracture ([place])   . Hyperlipidemia   . Hypertension   . Hypothyroid   . Neuropathy   . Osteomyelitis ([place])    left first metatarsal  . Stroke [facility])     Past Surgical History:  Procedure Laterality Date  . ACHILLES TENDON SURGERY Left 02/08/2015   Procedure: ACHILLES LENGTHENING/KIDNER;  Surgeon: Albertine Patricia, DPM;  Location: [facility] ORS;  Service: Podiatry;  Laterality: Left;  . APPENDECTOMY    . CARDIAC CATHETERIZATION  08/25/13  . CATARACT EXTRACTION W/PHACO Left 07/20/2014   Procedure: CATARACT EXTRACTION PHACO AND INTRAOCULAR LENS PLACEMENT (IOC);  Surgeon: Leandrew Koyanagi, MD;  Location: [facility] ORS;  Service: Ophthalmology;  Laterality: Left;  [place]  1:18                 AP     23.6             CDE   9.69      lot #[phone]  . CHOLECYSTECTOMY    . CORONARY ANGIOPLASTY    . HALLUX VALGUS AKIN Left 02/08/2015   Procedure: HALLUX VALGUS AKIN/ KELLER;  Surgeon: Albertine Patricia, DPM;  Location: [facility]  ORS;  Service: Podiatry;  Laterality: Left;  IVA with Local needs 1 hour for this case   . HEMIARTHROPLASTY HIP Right   . HEMIARTHROPLASTY HIP Left   . INCISION AND DRAINAGE Left 10/11/2014   Procedure: Removal of infected tibial sessmoid;  Surgeon: Albertine Patricia, DPM;  Location: [facility] ORS;  Service: Podiatry;  Laterality: Left;  . IRRIGATION AND DEBRIDEMENT FOOT Right 03/21/2017   Procedure: IRRIGATION AND DEBRIDEMENT FOOT right great toe amputation;  Surgeon: Sharlotte Alamo, DPM;  Location: [facility] ORS;  Service: Podiatry;  Laterality: Right;  . IRRIGATION AND DEBRIDEMENT FOOT Right 04/18/2017   Procedure: IRRIGATION AND DEBRIDEMENT FOOT and application wound vac;  Surgeon: Albertine Patricia, DPM;  Location: [facility] ORS;  Service: Podiatry;  Laterality: Right;  . PERIPHERAL VASCULAR BALLOON ANGIOPLASTY Right 04/17/2017   Procedure: PERIPHERAL VASCULAR BALLOON ANGIOPLASTY;  Surgeon: Katha Cabal, MD;  Location: [place] CV LAB;  Service: Cardiovascular;  Laterality: Right;    There were no vitals filed for this visit.      Self Care:  Patient seen for toileting, requires min assist with toilet transfer, she reports toilet in the clinic is lower  than hers.  Min assist  with clothing negotiation and use of grab bar in standing to pull up pants.  Patient able to now start to hold the grab bar with her right hand And was unable to perform this at eval.     Therex:    Patient seen for strengthening with 2# dowel upgraded from 1# in the past, able to grip with cues, occasional guiding to adjust grip. Patient performing shoulder flexion, ABD/ADD, chest press, elbow flexion/extension, diagonal patterns for 2 sets of 10 repetitions each  With cues for form and technique.  Difficulty recalling exercises after first set to complete for second set.  Manipulation of small pegs from container but lacks skills to turn and place into grid.  Once pegs are in the grid she is able to remove items from  The  grid.  Will need to continue to work towards manipulation skills and higher level hand function.                     OT Education - 02/10/18 1545    Education Details  clothing negotation in standing at toilet, use of grab bar, safety    Person(s) Educated  Patient    Methods  Explanation;Demonstration;Verbal cues    Comprehension  Verbalized understanding;Returned demonstration;Verbal cues required          OT Long Term Goals - 01/02/18 0943      OT LONG TERM GOAL #1   Title  Patient will demonstrate increase in grip strength by 5# to be able to hold utensils in right hand for self feeding.     Baseline  currently using left hand to feed self     Time  6    Period  Weeks    Status  New    Target Date  02/10/18      OT LONG TERM GOAL #2   Title  Patient will demonstrate improved coordination to pick up small objects in right hand 1/2 inch in size.     Baseline  unable at eval    Time  12    Period  Weeks    Status  New    Target Date  03/24/18      OT LONG TERM GOAL #3   Title  Patient to perform UB dressing with modified independence     Baseline  min assist at eval    Time  6    Period  Weeks    Status  New    Target Date  02/10/18      OT LONG TERM GOAL #4   Title  Patient will complete bathing with supervision    Baseline  moderate assist at eval     Time  12    Period  Weeks    Status  New    Target Date  03/24/18      OT LONG TERM GOAL #5   Title  Patient will demonstrate HEP independently    Baseline  no current program     Time  12    Period  Weeks    Status  New    Target Date  03/24/18      Long Term Additional Goals   Additional Long Term Goals  Yes      OT LONG TERM GOAL #6   Title  Patient will demonstrate ability to hold pen and write/sign name on important papers with 75% legibility     Baseline  difficulty  holding pen and legiblity less than 25% at eval    Time  12    Period  Weeks    Status  New    Target Date   03/24/18            Plan - 02/10/18 1531    Clinical Impression Statement  Patient requires min to moderate cues for performance of exercises for proper form and technique.  She continues to progress with right hand with increased active movement, improved ability to pick up objects but lacks skills for manipulation of objects.  She requires min assist with toileting with clothing negotiation and transfer.     Occupational Profile and client history currently impacting functional performance  History of falls, currently being seen by PT, toe amputation, balance, arthritic changes in bilateral UEs, lives alone but currently in assistive living    Occupational performance deficits (Please refer to evaluation for details):  ADL's;IADL's;Social Participation    Rehab Potential  Good    Current Impairments/barriers affecting progress:  significant weakness in R hand compared to left, balance, fall risk    OT Frequency  2x / week    OT Duration  12 weeks    OT Treatment/Interventions  Self-care/ADL training;Therapeutic exercise;Moist Heat;Neuromuscular education;Splinting;Patient/family education;Therapeutic activities;Balance training;Contrast Bath;DME and/or AE instruction;Manual Therapy    Consulted and Agree with Plan of Care  Patient       Patient will benefit from skilled therapeutic intervention in order to improve the following deficits and impairments:  Abnormal gait, Decreased knowledge of use of DME, Impaired flexibility, Pain, Decreased coordination, Decreased mobility, Decreased activity tolerance, Decreased endurance, Decreased range of motion, Decreased strength, Decreased balance, Difficulty walking, Impaired UE functional use  Visit Diagnosis: Muscle weakness (generalized)  Other lack of coordination    Problem List Patient Active Problem List   Diagnosis Date Noted  . Atherosclerosis of native arteries of the extremities with ulceration ([place]) 05/07/2017  . Non-healing  ulcer ([place]) 04/16/2017  . Diabetic neuropathy ([place]) 04/07/2017  . Septic shock ([place]) 03/20/2017  . CKD (chronic kidney disease), stage III ([place]) 03/19/2017  . Goals of care, counseling/discussion 03/03/2017  . PAD (peripheral artery disease) ([place]) 02/03/2017  . Abdominal aortic stenosis 02/03/2017  . Bilateral lower extremity edema 02/03/2017  . Anemia 01/29/2017  . B12 deficiency 01/29/2017  . Pancreatic mass 01/29/2017  . Hyponatremia 07/07/2015  . Foot ulcer ([place]) 02/07/2015  . ARF (acute renal failure) ([place]) 11/05/2014  . Diabetic foot infection ([place]) 10/10/2014  . Chronic diastolic heart failure ([place]) 05/31/2014  . HTN (hypertension) 05/31/2014  . DM (diabetes mellitus), type 2, uncontrolled ([place]) 05/31/2014    Amiaya Mcneeley 02/10/2018, 3:52 PM  [place] MAIN [facility] SERVICES [address] [place], [place], [address] Phone: [phone]   Fax:  [phone]  Name: ONEDA DUFFETT MRN: [id number] Date of Birth: [birth year]

## 2018-02-08 NOTE — Therapy (Signed)
[place] MAIN [facility] SERVICES [address] [place], [place], [address] Phone: [phone]   Fax:  [phone]  Physical Therapy Treatment  Patient Details  Name: Sharon [name] MRN: [id number] Date of Birth: [birth year] Referring Provider (PT): [place], [place]   Encounter Date: 02/08/2018  PT End of Session - 02/08/18 1627    Visit Number  14    Number of Visits  17    Date for PT Re-Evaluation  03/22/18    Authorization Type  4/10    PT Start Time  1432    PT Stop Time  1515    PT Time Calculation (min)  43 min    Equipment Utilized During Treatment  Gait belt    Activity Tolerance  Patient tolerated treatment well;Patient limited by fatigue       Past Medical History:  Diagnosis Date  . Anemia   . Atrial fibrillation ([place])   . Cardiac arrest ([place])   . Cataract   . CHF (congestive heart failure) ([place])   . Diabetes mellitus without complication ([place])   . Edema    feet/ankles occas  . Hip fracture ([place])   . Hyperlipidemia   . Hypertension   . Hypothyroid   . Neuropathy   . Osteomyelitis ([place])    left first metatarsal  . Stroke [facility])     Past Surgical History:  Procedure Laterality Date  . ACHILLES TENDON SURGERY Left 02/08/2015   Procedure: ACHILLES LENGTHENING/KIDNER;  Surgeon: Albertine Patricia, DPM;  Location: [facility] ORS;  Service: Podiatry;  Laterality: Left;  . APPENDECTOMY    . CARDIAC CATHETERIZATION  08/25/13  . CATARACT EXTRACTION W/PHACO Left 07/20/2014   Procedure: CATARACT EXTRACTION PHACO AND INTRAOCULAR LENS PLACEMENT (IOC);  Surgeon: Leandrew Koyanagi, MD;  Location: [facility] ORS;  Service: Ophthalmology;  Laterality: Left;  [place]  1:18                 AP     23.6             CDE   9.69      lot #[phone]  . CHOLECYSTECTOMY    . CORONARY ANGIOPLASTY    . HALLUX VALGUS AKIN Left 02/08/2015   Procedure: HALLUX VALGUS AKIN/ KELLER;  Surgeon: Albertine Patricia, DPM;  Location: [facility] ORS;  Service: Podiatry;  Laterality:  Left;  IVA with Local needs 1 hour for this case   . HEMIARTHROPLASTY HIP Right   . HEMIARTHROPLASTY HIP Left   . INCISION AND DRAINAGE Left 10/11/2014   Procedure: Removal of infected tibial sessmoid;  Surgeon: Albertine Patricia, DPM;  Location: [facility] ORS;  Service: Podiatry;  Laterality: Left;  . IRRIGATION AND DEBRIDEMENT FOOT Right 03/21/2017   Procedure: IRRIGATION AND DEBRIDEMENT FOOT right great toe amputation;  Surgeon: Sharlotte Alamo, DPM;  Location: [facility] ORS;  Service: Podiatry;  Laterality: Right;  . IRRIGATION AND DEBRIDEMENT FOOT Right 04/18/2017   Procedure: IRRIGATION AND DEBRIDEMENT FOOT and application wound vac;  Surgeon: Albertine Patricia, DPM;  Location: [facility] ORS;  Service: Podiatry;  Laterality: Right;  . PERIPHERAL VASCULAR BALLOON ANGIOPLASTY Right 04/17/2017   Procedure: PERIPHERAL VASCULAR BALLOON ANGIOPLASTY;  Surgeon: Katha Cabal, MD;  Location: [place] CV LAB;  Service: Cardiovascular;  Laterality: Right;    There were no vitals filed for this visit.  Subjective Assessment - 02/08/18 1438    Subjective  Patient reports no falls or LOB since last session. Has not been doing HEp daily but is doing it intermittently when  she has help.     Pertinent History  Patient stopped walking in march. She had a fall at home and she went to the ER and she had infections in bilateral feet. she had her great toe R foot ampuated and she had a wound on L foot. She went to the hospital and then to [facility]. She went back to the MD and had a second surgery in March and then went to another SNF for 3-4 months. She was not able to walk due to being non weight bearing. She was discharged to [facility] an assisted living facility. she had PT 2 x / week. She was able to walk short distances with RW.  She is able to dress herself, she takes herself to the bathroom with assist. She has had several falls.     Limitations  Walking    How long can you stand comfortably?  15 mins if she is holding  on    Patient Stated Goals  She wants to walk again.     Currently in Pain?  No/denies       Treatment:   Nu-step x 5 mins, tactile cueing to R knee for neutral body mechanics , BUE and BLE   Gait with Rw 100 feet with poor positioning and bilat. Knees flexed with min assist and grip on her belt due to hx of knees buckling  Sit to stand to RW from mat table, CGA with tactile cueing for alignment of R knee x 5 trials.    Seated:all seated therex required tactile and verbal cueing for sequencing to increase LE strength for improved mobility and safety with ambulation.   LAQ: 10x each LE, challenging to maintain upright posture with frequent trunk lean to opposite direction. Verbal cueing required frequently throughout exercise for positioning.   Ankle pumps : 10x for reduction of swelling in LE's  Isometric abduction into PT's hands: 10x verbal cueing for equal muscle recruitment.   Isometric flexion: into PT's hand: 10x verbal cueing for muscle recruitment.   RTB hamstring curls 10x, difficulty with eccentric control.    Patient has no reports of pain and has difficulty with getting and keeping her knees straight and fully extended during transfers and standing and walking.    Pt requires consistent encouragement during session and struggles with B knee weakness and inability to stand with her knees in extension for short periods of time.                         PT Education - 02/08/18 1618    Education Details  exercise technique, stability, ambulation     Person(s) Educated  Patient    Methods  Explanation;Demonstration;Tactile cues;Verbal cues    Comprehension  Verbalized understanding;Returned demonstration;Verbal cues required;Tactile cues required       PT Short Term Goals - 12/30/17 1152      PT SHORT TERM GOAL #1   Title  Patient will be independent in home exercise program to improve strength/mobility for better functional independence with ADLs.     Baseline  inconsistent    Time  4    Period  Weeks    Status  Partially Met    Target Date  12/28/17      PT SHORT TERM GOAL #2   Title  Patient will transfer from wc to bed with SBA and RW.    Baseline  needs min assist , able to complete stand pivot transfer with  RW SBA    Time  4    Period  Weeks    Status  Achieved    Target Date  12/28/17      PT SHORT TERM GOAL #3   Title  Patient will improve her 43 MW test from . 2 m/sec to . 5 m/sec with RW    Baseline  . 2 m/sec, 12/30/17: 0.2 m/s    Time  4    Period  Weeks    Status  Not Met    Target Date  12/28/17        PT Long Term Goals - 01/25/18 1311      PT LONG TERM GOAL #1   Title  Patient will be able to perform 5 sit to stand transfers with RW using her hands to push up from a 17 inch chair    Baseline  performs sit to stand transfer with UE assist from 20 inch wc 5 x in 37 seconds1/13/20=38.58sec from 18 inch chair    Time  8    Period  Weeks    Status  Partially Met    Target Date  03/22/18      PT LONG TERM GOAL #2   Title  Patient will be able to stand x 10 mins with SBA to be able to perform standing activiites at the counter    Baseline  patient is able to stand for 6  min, 01/25/18= 7 15 seconds    Time  8    Period  Weeks    Status  Partially Met    Target Date  03/22/18      PT LONG TERM GOAL #3   Title  Patient will be able to ambulate with RW and SBA  assist x 200 feet     Baseline  Ambulates with min assist and wc following 100 feet 01/25/18= 125 feet with RW and SBA    Time  8    Period  Weeks    Status  Partially Met    Target Date  03/22/18      PT LONG TERM GOAL #4   Title  patient will impvoe BLE hip strength to 4/5 to improve mobility and safety    Baseline  01/25/18=2/5 hip abd and add,, 3/5 hip flex, unable to test hip ext    Time  8    Period  Weeks    Status  Partially Met    Target Date  03/22/18      PT LONG TERM GOAL #5   Title  Patient will improve static standing balance  from poor plus to fair and be able to reach for objects on the counter top.    Baseline  poor plus static standing balance, poor plus standing balance 12/18,  01/25/18 poor plus     Time  8    Period  Weeks    Status  Not Met    Target Date  03/22/18            Plan - 02/08/18 1630    Clinical Impression Statement   Patient presents with good motivation to session, participating with eagerness throughout session. Bilateral knee flexion was noted throughout session with exacerbation from fatigue with ambulation creating a hinge in hip flexors as well reducing step length and stability. Limited control of right LE muscle contraction noted with improved mechanics/coordination with repetition. skilled PT in order to increase gait speed, increase BLE strength, and improve dynamic standing balance to  decrease risk for falls and enable patient to participate in desired activities.    Rehab Potential  Fair    PT Frequency  2x / week    PT Duration  8 weeks    PT Treatment/Interventions  Aquatic Therapy;Gait training;Therapeutic exercise;Therapeutic activities;Functional mobility training;Balance training;Neuromuscular re-education;Patient/family education;Moist Heat    PT Next Visit Plan  LE strengthening, gait training    PT Home Exercise Plan  bridges, marching, hip abd/ER GTB, SLR    Consulted and Agree with Plan of Care  Patient;Other (Comment)   friend      Patient will benefit from skilled therapeutic intervention in order to improve the following deficits and impairments:  Abnormal gait, Decreased balance, Decreased endurance, Decreased mobility, Difficulty walking, Impaired UE functional use, Decreased strength, Decreased activity tolerance  Visit Diagnosis: Muscle weakness (generalized)  Other lack of coordination  Difficulty in walking, not elsewhere classified     Problem List Patient Active Problem List   Diagnosis Date Noted  . Atherosclerosis of native arteries of the  extremities with ulceration ([place]) 05/07/2017  . Non-healing ulcer ([place]) 04/16/2017  . Diabetic neuropathy ([place]) 04/07/2017  . Septic shock ([place]) 03/20/2017  . CKD (chronic kidney disease), stage III ([place]) 03/19/2017  . Goals of care, counseling/discussion 03/03/2017  . PAD (peripheral artery disease) ([place]) 02/03/2017  . Abdominal aortic stenosis 02/03/2017  . Bilateral lower extremity edema 02/03/2017  . Anemia 01/29/2017  . B12 deficiency 01/29/2017  . Pancreatic mass 01/29/2017  . Hyponatremia 07/07/2015  . Foot ulcer ([place]) 02/07/2015  . ARF (acute renal failure) ([place]) 11/05/2014  . Diabetic foot infection ([place]) 10/10/2014  . Chronic diastolic heart failure ([place]) 05/31/2014  . HTN (hypertension) 05/31/2014  . DM (diabetes mellitus), type 2, uncontrolled ([place]) 05/31/2014   Janna Arch, PT, DPT    02/08/2018, 4:30 PM  [place] MAIN [facility] SERVICES [address] [place], [place], [address] Phone: [phone]   Fax:  [phone]  Name: Sharon [name] MRN: [id number] Date of Birth: 16-Nov-1933

## 2018-02-09 DIAGNOSIS — Z89421 Acquired absence of other right toe(s): Secondary | ICD-10-CM | POA: Diagnosis not present

## 2018-02-09 DIAGNOSIS — L851 Acquired keratosis [keratoderma] palmaris et plantaris: Secondary | ICD-10-CM | POA: Diagnosis not present

## 2018-02-09 DIAGNOSIS — E1142 Type 2 diabetes mellitus with diabetic polyneuropathy: Secondary | ICD-10-CM | POA: Diagnosis not present

## 2018-02-09 DIAGNOSIS — B351 Tinea unguium: Secondary | ICD-10-CM | POA: Diagnosis not present

## 2018-02-10 ENCOUNTER — Encounter: Payer: Self-pay | Admitting: Physical Therapy

## 2018-02-10 ENCOUNTER — Ambulatory Visit: Payer: PPO | Admitting: Physical Therapy

## 2018-02-10 ENCOUNTER — Ambulatory Visit: Payer: PPO | Admitting: Occupational Therapy

## 2018-02-10 DIAGNOSIS — R278 Other lack of coordination: Secondary | ICD-10-CM

## 2018-02-10 DIAGNOSIS — M6281 Muscle weakness (generalized): Secondary | ICD-10-CM | POA: Diagnosis not present

## 2018-02-10 DIAGNOSIS — R262 Difficulty in walking, not elsewhere classified: Secondary | ICD-10-CM

## 2018-02-10 NOTE — Therapy (Signed)
[place] MAIN [facility] SERVICES [address] [place], [place], [address] Phone: [phone]   Fax:  [phone]  Physical Therapy Treatment  Patient Details  Name: Sharon [name] MRN: [id number] Date of Birth: Jan 07, 1934 Referring Provider (PT): [place], [place]   Encounter Date: 02/10/2018  PT End of Session - 02/10/18 1408    Visit Number  15    Number of Visits  17    Date for PT Re-Evaluation  03/22/18    Authorization Type  5/10    PT Start Time  0145    PT Stop Time  0230    PT Time Calculation (min)  45 min    Equipment Utilized During Treatment  Gait belt    Activity Tolerance  Patient tolerated treatment well;Patient limited by fatigue       Past Medical History:  Diagnosis Date  . Anemia   . Atrial fibrillation ([place])   . Cardiac arrest ([place])   . Cataract   . CHF (congestive heart failure) ([place])   . Diabetes mellitus without complication ([place])   . Edema    feet/ankles occas  . Hip fracture ([place])   . Hyperlipidemia   . Hypertension   . Hypothyroid   . Neuropathy   . Osteomyelitis ([place])    left first metatarsal  . Stroke [facility])     Past Surgical History:  Procedure Laterality Date  . ACHILLES TENDON SURGERY Left 02/08/2015   Procedure: ACHILLES LENGTHENING/KIDNER;  Surgeon: Albertine Patricia, DPM;  Location: [facility] ORS;  Service: Podiatry;  Laterality: Left;  . APPENDECTOMY    . CARDIAC CATHETERIZATION  08/25/13  . CATARACT EXTRACTION W/PHACO Left 07/20/2014   Procedure: CATARACT EXTRACTION PHACO AND INTRAOCULAR LENS PLACEMENT (IOC);  Surgeon: Leandrew Koyanagi, MD;  Location: [facility] ORS;  Service: Ophthalmology;  Laterality: Left;  [place]  1:18                 AP     23.6             CDE   9.69      lot #[phone]  . CHOLECYSTECTOMY    . CORONARY ANGIOPLASTY    . HALLUX VALGUS AKIN Left 02/08/2015   Procedure: HALLUX VALGUS AKIN/ KELLER;  Surgeon: Albertine Patricia, DPM;  Location: [facility] ORS;  Service: Podiatry;  Laterality:  Left;  IVA with Local needs 1 hour for this case   . HEMIARTHROPLASTY HIP Right   . HEMIARTHROPLASTY HIP Left   . INCISION AND DRAINAGE Left 10/11/2014   Procedure: Removal of infected tibial sessmoid;  Surgeon: Albertine Patricia, DPM;  Location: [facility] ORS;  Service: Podiatry;  Laterality: Left;  . IRRIGATION AND DEBRIDEMENT FOOT Right 03/21/2017   Procedure: IRRIGATION AND DEBRIDEMENT FOOT right great toe amputation;  Surgeon: Sharlotte Alamo, DPM;  Location: [facility] ORS;  Service: Podiatry;  Laterality: Right;  . IRRIGATION AND DEBRIDEMENT FOOT Right 04/18/2017   Procedure: IRRIGATION AND DEBRIDEMENT FOOT and application wound vac;  Surgeon: Albertine Patricia, DPM;  Location: [facility] ORS;  Service: Podiatry;  Laterality: Right;  . PERIPHERAL VASCULAR BALLOON ANGIOPLASTY Right 04/17/2017   Procedure: PERIPHERAL VASCULAR BALLOON ANGIOPLASTY;  Surgeon: Katha Cabal, MD;  Location: [place] CV LAB;  Service: Cardiovascular;  Laterality: Right;    There were no vitals filed for this visit.    Treatment:  Nu-step x 5 mins, BUE and BLE  Transfers wc <> commode with min assist , max assist to don her undergarments  LAQ with 2 lbs  on LLE, no weight on RLE x 20 x 2  Seated hip flex x 20 x 2 BLE  Gait with Rw 40 feet x 4 with poor positioning and bilat. Knees flexed with min assist and grip on her belt due to hx of knees buckling  Standing at mat level and RW with poor knee extension and partial flex of bilateral knees for 7 minutes, 6 mins  Patient needed to use the commode and needs min assist for transfers wc<>commode, assist for donning her clothing  Sit to standto RW From mat level x 10 , with cga and cues for safety and sequencing  Patient has no reports of pain and has difficulty withgetting and keeping her knees straight.and fully extended during transfers and standing and walking. Pt requires consistent encouragement during sessionand struggles with B knee weakness and  inability to stand with her knees in extension for short periods of time.                        PT Education - 02/10/18 1355    Education Details  HEP    Person(s) Educated  Patient    Methods  Explanation    Comprehension  Verbalized understanding       PT Short Term Goals - 12/30/17 1152      PT SHORT TERM GOAL #1   Title  Patient will be independent in home exercise program to improve strength/mobility for better functional independence with ADLs.    Baseline  inconsistent    Time  4    Period  Weeks    Status  Partially Met    Target Date  12/28/17      PT SHORT TERM GOAL #2   Title  Patient will transfer from wc to bed with SBA and RW.    Baseline  needs min assist , able to complete stand pivot transfer with RW SBA    Time  4    Period  Weeks    Status  Achieved    Target Date  12/28/17      PT SHORT TERM GOAL #3   Title  Patient will improve her 66 MW test from . 2 m/sec to . 5 m/sec with RW    Baseline  . 2 m/sec, 12/30/17: 0.2 m/s    Time  4    Period  Weeks    Status  Not Met    Target Date  12/28/17        PT Long Term Goals - 01/25/18 1311      PT LONG TERM GOAL #1   Title  Patient will be able to perform 5 sit to stand transfers with RW using her hands to push up from a 17 inch chair    Baseline  performs sit to stand transfer with UE assist from 20 inch wc 5 x in 37 seconds1/13/20=38.58sec from 18 inch chair    Time  8    Period  Weeks    Status  Partially Met    Target Date  03/22/18      PT LONG TERM GOAL #2   Title  Patient will be able to stand x 10 mins with SBA to be able to perform standing activiites at the counter    Baseline  patient is able to stand for 6  min, 01/25/18= 7 15 seconds    Time  8    Period  Weeks    Status  Partially  Met    Target Date  03/22/18      PT LONG TERM GOAL #3   Title  Patient will be able to ambulate with RW and SBA  assist x 200 feet     Baseline  Ambulates with min assist and wc  following 100 feet 01/25/18= 125 feet with RW and SBA    Time  8    Period  Weeks    Status  Partially Met    Target Date  03/22/18      PT LONG TERM GOAL #4   Title  patient will impvoe BLE hip strength to 4/5 to improve mobility and safety    Baseline  01/25/18=2/5 hip abd and add,, 3/5 hip flex, unable to test hip ext    Time  8    Period  Weeks    Status  Partially Met    Target Date  03/22/18      PT LONG TERM GOAL #5   Title  Patient will improve static standing balance from poor plus to fair and be able to reach for objects on the counter top.    Baseline  poor plus static standing balance, poor plus standing balance 12/18,  01/25/18 poor plus     Time  8    Period  Weeks    Status  Not Met    Target Date  03/22/18            Plan - 02/10/18 1409    Clinical Impression Statement  Patient has flexed knees and hips during standing with Rw and has poor stepping quality during gait. She has weakness in BLE R>L , decreased transfers from wc <>mat, and decreased standing time. She will conitnue to benefit from skilled PT to improve saftey and mobility.    Rehab Potential  Fair    PT Frequency  2x / week    PT Duration  8 weeks    PT Treatment/Interventions  Aquatic Therapy;Gait training;Therapeutic exercise;Therapeutic activities;Functional mobility training;Balance training;Neuromuscular re-education;Patient/family education;Moist Heat    PT Next Visit Plan  LE strengthening, gait training    PT Home Exercise Plan  bridges, marching, hip abd/ER GTB, SLR    Consulted and Agree with Plan of Care  Patient;Other (Comment)   friend      Patient will benefit from skilled therapeutic intervention in order to improve the following deficits and impairments:  Abnormal gait, Decreased balance, Decreased endurance, Decreased mobility, Difficulty walking, Impaired UE functional use, Decreased strength, Decreased activity tolerance  Visit Diagnosis: Other lack of  coordination  Difficulty in walking, not elsewhere classified  Muscle weakness (generalized)     Problem List Patient Active Problem List   Diagnosis Date Noted  . Atherosclerosis of native arteries of the extremities with ulceration ([place]) 05/07/2017  . Non-healing ulcer ([place]) 04/16/2017  . Diabetic neuropathy ([place]) 04/07/2017  . Septic shock ([place]) 03/20/2017  . CKD (chronic kidney disease), stage III ([place]) 03/19/2017  . Goals of care, counseling/discussion 03/03/2017  . PAD (peripheral artery disease) ([place]) 02/03/2017  . Abdominal aortic stenosis 02/03/2017  . Bilateral lower extremity edema 02/03/2017  . Anemia 01/29/2017  . B12 deficiency 01/29/2017  . Pancreatic mass 01/29/2017  . Hyponatremia 07/07/2015  . Foot ulcer ([place]) 02/07/2015  . ARF (acute renal failure) ([place]) 11/05/2014  . Diabetic foot infection ([place]) 10/10/2014  . Chronic diastolic heart failure ([place]) 05/31/2014  . HTN (hypertension) 05/31/2014  . DM (diabetes mellitus), type 2, uncontrolled ([place]) 05/31/2014    Siriyah Ambrosius,  Sherryl Barters, PT DPT 02/10/2018, 2:29 PM  [place] MAIN [facility] SERVICES [address] [place], [place], [address] Phone: [phone]   Fax:  [phone]  Name: Sharon [name] MRN: [id number] Date of Birth: 25-Oct-1933

## 2018-02-10 NOTE — Therapy (Signed)
[place] MAIN [facility] SERVICES [address] [place], [place], [address] Phone: [phone]   Fax:  [phone]  Occupational Therapy Treatment  Patient Details  Name: Sharon [name] MRN: [id number] Date of Birth: May 04, 1933 Referring Provider (OT): Emily Filbert   Encounter Date: 02/03/2018  OT End of Session - 02/10/18 1529    Visit Number  5    Number of Visits  24    Date for OT Re-Evaluation  03/24/18    Authorization Type  Medicare visit 5 of 10, reporting period starting 12/30/2017    OT Start Time  1300    OT Stop Time  1345    OT Time Calculation (min)  45 min    Activity Tolerance  Patient tolerated treatment well    Behavior During Therapy  [facility] for tasks assessed/performed       Past Medical History:  Diagnosis Date  . Anemia   . Atrial fibrillation ([place])   . Cardiac arrest ([place])   . Cataract   . CHF (congestive heart failure) ([place])   . Diabetes mellitus without complication ([place])   . Edema    feet/ankles occas  . Hip fracture ([place])   . Hyperlipidemia   . Hypertension   . Hypothyroid   . Neuropathy   . Osteomyelitis ([place])    left first metatarsal  . Stroke [facility])     Past Surgical History:  Procedure Laterality Date  . ACHILLES TENDON SURGERY Left 02/08/2015   Procedure: ACHILLES LENGTHENING/KIDNER;  Surgeon: Albertine Patricia, DPM;  Location: [facility] ORS;  Service: Podiatry;  Laterality: Left;  . APPENDECTOMY    . CARDIAC CATHETERIZATION  08/25/13  . CATARACT EXTRACTION W/PHACO Left 07/20/2014   Procedure: CATARACT EXTRACTION PHACO AND INTRAOCULAR LENS PLACEMENT (IOC);  Surgeon: Leandrew Koyanagi, MD;  Location: [facility] ORS;  Service: Ophthalmology;  Laterality: Left;  [place]  1:18                 AP     23.6             CDE   9.69      lot #[id number]  . CHOLECYSTECTOMY    . CORONARY ANGIOPLASTY    . HALLUX VALGUS AKIN Left 02/08/2015   Procedure: HALLUX VALGUS AKIN/ KELLER;  Surgeon: Albertine Patricia, DPM;  Location: [facility]  ORS;  Service: Podiatry;  Laterality: Left;  IVA with Local needs 1 hour for this case   . HEMIARTHROPLASTY HIP Right   . HEMIARTHROPLASTY HIP Left   . INCISION AND DRAINAGE Left 10/11/2014   Procedure: Removal of infected tibial sessmoid;  Surgeon: Albertine Patricia, DPM;  Location: [facility] ORS;  Service: Podiatry;  Laterality: Left;  . IRRIGATION AND DEBRIDEMENT FOOT Right 03/21/2017   Procedure: IRRIGATION AND DEBRIDEMENT FOOT right great toe amputation;  Surgeon: Sharlotte Alamo, DPM;  Location: [facility] ORS;  Service: Podiatry;  Laterality: Right;  . IRRIGATION AND DEBRIDEMENT FOOT Right 04/18/2017   Procedure: IRRIGATION AND DEBRIDEMENT FOOT and application wound vac;  Surgeon: Albertine Patricia, DPM;  Location: [facility] ORS;  Service: Podiatry;  Laterality: Right;  . PERIPHERAL VASCULAR BALLOON ANGIOPLASTY Right 04/17/2017   Procedure: PERIPHERAL VASCULAR BALLOON ANGIOPLASTY;  Surgeon: Katha Cabal, MD;  Location: [place] CV LAB;  Service: Cardiovascular;  Laterality: Right;    There were no vitals filed for this visit.  Subjective Assessment - 02/10/18 1529    Subjective   Patient reports she is doing well, using her hand more especially for self feeding  tasks.     Pertinent History  Pt with an extensive history since March of this year with falls, infection in her feet, great toe of R foot amputated and has been in a skilled nursing facility for many of those months with recent transition to assistive living facility.  She did not ambulate for many months and recently able to walk short distances with an assistive device, she has had multiple falls. She complains of bilateral hand pain but especially on the right side with reports of difficulty holding items, weakness and stiffness which she reports developed suddenly a few weeks ago, she reports CT scan performed but no evidence of stroke. She does report history of arthritic changes in the hands    Patient Stated Goals  Patient reports she would  like to be able to get up and walk, use right hand for self care.  Be able to write again.    Currently in Pain?  No/denies    Pain Score  0-No pain      Therex:   Patient seen this date for focus on wrist exercises, wrist flexion/extension with guiding and assist from therapist Patient moving through her available range of motion followed by therapist finishing the motion towards end range. Pt performing isometric exercises with wrist for flexion/extension.  Exercises performed slowly, with repetition and with cues Patient is unable to recall exercises after a short time has passed and required reinstruction.  Will provide written instructions for home. Patient liked the use of the blue wedge to perform exercises over, instructed on use of arm rest to perform at home.   Reaching tasks with accessing items from table top with cues as needed.  Grip with 6# hand gripper with assist for guiding hand gripper since it is top heavy for her, picking up items for sustained gripping patterns, verbal cues and  Guiding from therapist provided.                     OT Education - 02/10/18 1529    Education Details  wrist exercises for ROM    Person(s) Educated  Patient    Methods  Explanation;Demonstration;Verbal cues    Comprehension  Verbalized understanding;Returned demonstration;Verbal cues required          OT Long Term Goals - 01/02/18 0943      OT LONG TERM GOAL #1   Title  Patient will demonstrate increase in grip strength by 5# to be able to hold utensils in right hand for self feeding.     Baseline  currently using left hand to feed self     Time  6    Period  Weeks    Status  New    Target Date  02/10/18      OT LONG TERM GOAL #2   Title  Patient will demonstrate improved coordination to pick up small objects in right hand 1/2 inch in size.     Baseline  unable at eval    Time  12    Period  Weeks    Status  New    Target Date  03/24/18      OT LONG TERM  GOAL #3   Title  Patient to perform UB dressing with modified independence     Baseline  min assist at eval    Time  6    Period  Weeks    Status  New    Target Date  02/10/18  OT LONG TERM GOAL #4   Title  Patient will complete bathing with supervision    Baseline  moderate assist at eval     Time  12    Period  Weeks    Status  New    Target Date  03/24/18      OT LONG TERM GOAL #5   Title  Patient will demonstrate HEP independently    Baseline  no current program     Time  12    Period  Weeks    Status  New    Target Date  03/24/18      Long Term Additional Goals   Additional Long Term Goals  Yes      OT LONG TERM GOAL #6   Title  Patient will demonstrate ability to hold pen and write/sign name on important papers with 75% legibility     Baseline  difficulty holding pen and legiblity less than 25% at eval    Time  12    Period  Weeks    Status  New    Target Date  03/24/18            Plan - 02/10/18 1536    Clinical Impression Statement  Patient requires min to moderate cues for performance of exercises for proper for and technique. She continues to progress with right hand with increased active movement, improved ability to pick up objects but lacks skills for manipulation of objects.     Occupational Profile and client history currently impacting functional performance  History of falls, currently being seen by PT, toe amputation, balance, arthritic changes in bilateral UEs, lives alone but currently in assistive living    Occupational performance deficits (Please refer to evaluation for details):  ADL's;IADL's;Social Participation    Rehab Potential  Good    Current Impairments/barriers affecting progress:  significant weakness in R hand compared to left, balance, fall risk    OT Frequency  2x / week    OT Duration  12 weeks    OT Treatment/Interventions  Self-care/ADL training;Therapeutic exercise;Moist Heat;Neuromuscular education;Splinting;Patient/family  education;Therapeutic activities;Balance training;Contrast Bath;DME and/or AE instruction;Manual Therapy    Consulted and Agree with Plan of Care  Patient       Patient will benefit from skilled therapeutic intervention in order to improve the following deficits and impairments:  Abnormal gait, Decreased knowledge of use of DME, Impaired flexibility, Pain, Decreased coordination, Decreased mobility, Decreased activity tolerance, Decreased endurance, Decreased range of motion, Decreased strength, Decreased balance, Difficulty walking, Impaired UE functional use  Visit Diagnosis: Muscle weakness (generalized)  Other lack of coordination    Problem List Patient Active Problem List   Diagnosis Date Noted  . Atherosclerosis of native arteries of the extremities with ulceration ([place]) 05/07/2017  . Non-healing ulcer ([place]) 04/16/2017  . Diabetic neuropathy ([place]) 04/07/2017  . Septic shock ([place]) 03/20/2017  . CKD (chronic kidney disease), stage III ([place]) 03/19/2017  . Goals of care, counseling/discussion 03/03/2017  . PAD (peripheral artery disease) ([place]) 02/03/2017  . Abdominal aortic stenosis 02/03/2017  . Bilateral lower extremity edema 02/03/2017  . Anemia 01/29/2017  . B12 deficiency 01/29/2017  . Pancreatic mass 01/29/2017  . Hyponatremia 07/07/2015  . Foot ulcer ([place]) 02/07/2015  . ARF (acute renal failure) ([place]) 11/05/2014  . Diabetic foot infection ([place]) 10/10/2014  . Chronic diastolic heart failure ([place]) 05/31/2014  . HTN (hypertension) 05/31/2014  . DM (diabetes mellitus), type 2, uncontrolled ([place]) 05/31/2014   Sharon [name], OTR/L, CLT  Sharon [name] 02/10/2018, 3:44 PM  [place] MAIN [facility] SERVICES [address] [place], [place], [address] Phone: [phone]   Fax:  [phone]  Name: Sharon [name] MRN: [id number] Date of Birth: 1933/02/27

## 2018-02-12 ENCOUNTER — Encounter: Payer: Self-pay | Admitting: Occupational Therapy

## 2018-02-12 NOTE — Therapy (Signed)
[place] MAIN [facility] SERVICES [address] [place], [place], [address] Phone: [phone]   Fax:  [phone]  Occupational Therapy Treatment  Patient Details  Name: Sharon [name] MRN: [id number] Date of Birth: 04-03-1933 Referring Provider (OT): Emily Filbert   Encounter Date: 02/10/2018  OT End of Session - 02/12/18 1500    Visit Number  7    Number of Visits  24    Date for OT Re-Evaluation  03/24/18    Authorization Type  Medicare visit 7 of 10, reporting period starting 12/30/2017    OT Start Time  1255    OT Stop Time  1345    OT Time Calculation (min)  50 min    Activity Tolerance  Patient tolerated treatment well    Behavior During Therapy  [facility] for tasks assessed/performed       Past Medical History:  Diagnosis Date  . Anemia   . Atrial fibrillation ([place])   . Cardiac arrest ([place])   . Cataract   . CHF (congestive heart failure) ([place])   . Diabetes mellitus without complication ([place])   . Edema    feet/ankles occas  . Hip fracture ([place])   . Hyperlipidemia   . Hypertension   . Hypothyroid   . Neuropathy   . Osteomyelitis ([place])    left first metatarsal  . Stroke [facility])     Past Surgical History:  Procedure Laterality Date  . ACHILLES TENDON SURGERY Left 02/08/2015   Procedure: ACHILLES LENGTHENING/KIDNER;  Surgeon: Albertine Patricia, DPM;  Location: [facility] ORS;  Service: Podiatry;  Laterality: Left;  . APPENDECTOMY    . CARDIAC CATHETERIZATION  08/25/13  . CATARACT EXTRACTION W/PHACO Left 07/20/2014   Procedure: CATARACT EXTRACTION PHACO AND INTRAOCULAR LENS PLACEMENT (IOC);  Surgeon: Leandrew Koyanagi, MD;  Location: [facility] ORS;  Service: Ophthalmology;  Laterality: Left;  [place]  1:18                 AP     23.6             CDE   9.69      lot #[phone]  . CHOLECYSTECTOMY    . CORONARY ANGIOPLASTY    . HALLUX VALGUS AKIN Left 02/08/2015   Procedure: HALLUX VALGUS AKIN/ KELLER;  Surgeon: Albertine Patricia, DPM;  Location: [facility]  ORS;  Service: Podiatry;  Laterality: Left;  IVA with Local needs 1 hour for this case   . HEMIARTHROPLASTY HIP Right   . HEMIARTHROPLASTY HIP Left   . INCISION AND DRAINAGE Left 10/11/2014   Procedure: Removal of infected tibial sessmoid;  Surgeon: Albertine Patricia, DPM;  Location: [facility] ORS;  Service: Podiatry;  Laterality: Left;  . IRRIGATION AND DEBRIDEMENT FOOT Right 03/21/2017   Procedure: IRRIGATION AND DEBRIDEMENT FOOT right great toe amputation;  Surgeon: Sharlotte Alamo, DPM;  Location: [facility] ORS;  Service: Podiatry;  Laterality: Right;  . IRRIGATION AND DEBRIDEMENT FOOT Right 04/18/2017   Procedure: IRRIGATION AND DEBRIDEMENT FOOT and application wound vac;  Surgeon: Albertine Patricia, DPM;  Location: [facility] ORS;  Service: Podiatry;  Laterality: Right;  . PERIPHERAL VASCULAR BALLOON ANGIOPLASTY Right 04/17/2017   Procedure: PERIPHERAL VASCULAR BALLOON ANGIOPLASTY;  Surgeon: Katha Cabal, MD;  Location: [place] CV LAB;  Service: Cardiovascular;  Laterality: Right;    There were no vitals filed for this visit.  Subjective Assessment - 02/12/18 1459    Subjective   Patient reports she has been trying to open up her hand on the right  but the middle and ring fingers still lag a bit.      Pertinent History  Pt with an extensive history since March of this year with falls, infection in her feet, great toe of R foot amputated and has been in a skilled nursing facility for many of those months with recent transition to assistive living facility.  She did not ambulate for many months and recently able to walk short distances with an assistive device, she has had multiple falls. She complains of bilateral hand pain but especially on the right side with reports of difficulty holding items, weakness and stiffness which she reports developed suddenly a few weeks ago, she reports CT scan performed but no evidence of stroke. She does report history of arthritic changes in the hands    Patient Stated Goals   Patient reports she would like to be able to get up and walk, use right hand for self care.  Be able to write again.    Currently in Pain?  No/denies    Pain Score  0-No pain           Therex: Patient seen for strengthening with Resistive pinch pins, placing onto elevated surface to encourage reaching patterns.  Patient able to complete yellow and red resistive pins only, cues for lateral and 3 point pinch, therapist also providing guiding for task with right UE, shoulder fatigues quickly, rest breaks as needed.  Neuromuscular Reeducation:  Patient seen for reaching tasks with use of Shape tower 1 level  only and requires min assistance for being able to reach to the top of the first level as well as to hold and grasp the variety of shapes.   Manipulation of Checkers picking up and placing into grid with the right hand, cues for prehension patterns and occasional guiding to place into grid.  Patient seen for Handwriting skills with list of grocery items, legibility was fair and therapist able to read 7/10 items.                  OT Education - 02/12/18 1459    Education Details  manipulation skills, reaching    Person(s) Educated  Patient    Methods  Explanation;Demonstration;Verbal cues    Comprehension  Verbalized understanding;Returned demonstration;Verbal cues required          OT Long Term Goals - 02/12/18 1512      OT LONG TERM GOAL #1   Title  Patient will demonstrate increase in grip strength by 5# to be able to hold utensils in right hand for self feeding.     Baseline  currently using left hand to feed self     Time  6    Period  Weeks    Status  On-going      OT LONG TERM GOAL #2   Title  Patient will demonstrate improved coordination to pick up small objects in right hand 1/2 inch in size.     Baseline  unable at eval    Time  12    Period  Weeks    Status  On-going      OT LONG TERM GOAL #3   Title  Patient to perform UB dressing with modified  independence     Baseline  min assist at eval    Time  6    Period  Weeks    Status  On-going      OT LONG TERM GOAL #4   Title  Patient will complete  bathing with supervision    Baseline  moderate assist at eval     Time  12    Period  Weeks    Status  On-going      OT LONG TERM GOAL #5   Title  Patient will demonstrate HEP independently    Baseline  no current program     Time  12    Period  Weeks    Status  On-going      OT LONG TERM GOAL #6   Title  Patient will demonstrate ability to hold pen and write/sign name on important papers with 75% legibility     Baseline  difficulty holding pen and legiblity less than 25% at eval    Time  12    Period  Weeks    Status  On-going            Plan - 02/12/18 1500    Clinical Impression Statement  Pt continues to progress with strength and ROM skills in right hand.  She is consistently feeding herself now and able to hold more items in her right hand.  She is starting to be able to pick up and manipulate items of a variety of sizes.  She continues to require cues for prehension patterns and some guiding with right UE.  Continue towards goals to maximize safety and independence in daily tasks.      Occupational Profile and client history currently impacting functional performance  History of falls, currently being seen by PT, toe amputation, balance, arthritic changes in bilateral UEs, lives alone but currently in assistive living    Occupational performance deficits (Please refer to evaluation for details):  ADL's;IADL's;Social Participation    Rehab Potential  Good    Current Impairments/barriers affecting progress:  significant weakness in R hand compared to left, balance, fall risk    OT Frequency  2x / week    OT Duration  12 weeks    OT Treatment/Interventions  Self-care/ADL training;Therapeutic exercise;Moist Heat;Neuromuscular education;Splinting;Patient/family education;Therapeutic activities;Balance training;Contrast  Bath;DME and/or AE instruction;Manual Therapy    Consulted and Agree with Plan of Care  Patient       Patient will benefit from skilled therapeutic intervention in order to improve the following deficits and impairments:  Abnormal gait, Decreased knowledge of use of DME, Impaired flexibility, Pain, Decreased coordination, Decreased mobility, Decreased activity tolerance, Decreased endurance, Decreased range of motion, Decreased strength, Decreased balance, Difficulty walking, Impaired UE functional use  Visit Diagnosis: Muscle weakness (generalized)  Other lack of coordination    Problem List Patient Active Problem List   Diagnosis Date Noted  . Atherosclerosis of native arteries of the extremities with ulceration ([place]) 05/07/2017  . Non-healing ulcer ([place]) 04/16/2017  . Diabetic neuropathy ([place]) 04/07/2017  . Septic shock ([place]) 03/20/2017  . CKD (chronic kidney disease), stage III ([place]) 03/19/2017  . Goals of care, counseling/discussion 03/03/2017  . PAD (peripheral artery disease) ([place]) 02/03/2017  . Abdominal aortic stenosis 02/03/2017  . Bilateral lower extremity edema 02/03/2017  . Anemia 01/29/2017  . B12 deficiency 01/29/2017  . Pancreatic mass 01/29/2017  . Hyponatremia 07/07/2015  . Foot ulcer ([place]) 02/07/2015  . ARF (acute renal failure) ([place]) 11/05/2014  . Diabetic foot infection ([place]) 10/10/2014  . Chronic diastolic heart failure ([place]) 05/31/2014  . HTN (hypertension) 05/31/2014  . DM (diabetes mellitus), type 2, uncontrolled ([place]) 05/31/2014   France Noyce T Hang Ammon, OTR/L, CLT  Sharon [name] 02/12/2018, 3:13 PM  [place] [facility] REHAB SERVICES  [place], [place], [address] Phone: [phone]   Fax:  [phone]  Name: Sharon [name] MRN: [id number] Date of Birth: 06-02-1933

## 2018-02-15 ENCOUNTER — Encounter: Payer: Self-pay | Admitting: Occupational Therapy

## 2018-02-15 ENCOUNTER — Ambulatory Visit: Payer: PPO | Admitting: Occupational Therapy

## 2018-02-15 ENCOUNTER — Encounter: Payer: Self-pay | Admitting: Physical Therapy

## 2018-02-15 ENCOUNTER — Ambulatory Visit: Payer: PPO | Attending: Podiatry | Admitting: Physical Therapy

## 2018-02-15 DIAGNOSIS — M6281 Muscle weakness (generalized): Secondary | ICD-10-CM | POA: Diagnosis not present

## 2018-02-15 DIAGNOSIS — R262 Difficulty in walking, not elsewhere classified: Secondary | ICD-10-CM | POA: Diagnosis not present

## 2018-02-15 DIAGNOSIS — R278 Other lack of coordination: Secondary | ICD-10-CM | POA: Diagnosis not present

## 2018-02-15 NOTE — Therapy (Signed)
[place] MAIN [facility] SERVICES [address] [place], [place], [address] Phone: [phone]   Fax:  [phone]  Physical Therapy Treatment  Patient Details  Name: Sharon [name] MRN: [id number] Date of Birth: May 01, 1933 Referring Provider (PT): [place], [place]   Encounter Date: 02/15/2018  PT End of Session - 02/15/18 1314    Visit Number  16    Number of Visits  17    Date for PT Re-Evaluation  03/22/18    Authorization Type  6/10    PT Start Time  0102    PT Stop Time  0145    PT Time Calculation (min)  43 min    Equipment Utilized During Treatment  Gait belt    Activity Tolerance  Patient tolerated treatment well;Patient limited by fatigue       Past Medical History:  Diagnosis Date  . Anemia   . Atrial fibrillation ([place])   . Cardiac arrest ([place])   . Cataract   . CHF (congestive heart failure) ([place])   . Diabetes mellitus without complication ([place])   . Edema    feet/ankles occas  . Hip fracture ([place])   . Hyperlipidemia   . Hypertension   . Hypothyroid   . Neuropathy   . Osteomyelitis ([place])    left first metatarsal  . Stroke [facility])     Past Surgical History:  Procedure Laterality Date  . ACHILLES TENDON SURGERY Left 02/08/2015   Procedure: ACHILLES LENGTHENING/KIDNER;  Surgeon: Albertine Patricia, DPM;  Location: [facility] ORS;  Service: Podiatry;  Laterality: Left;  . APPENDECTOMY    . CARDIAC CATHETERIZATION  08/25/13  . CATARACT EXTRACTION W/PHACO Left 07/20/2014   Procedure: CATARACT EXTRACTION PHACO AND INTRAOCULAR LENS PLACEMENT (IOC);  Surgeon: Leandrew Koyanagi, MD;  Location: [facility] ORS;  Service: Ophthalmology;  Laterality: Left;  [place]  1:18                 AP     23.6             CDE   9.69      lot #[id number]  . CHOLECYSTECTOMY    . CORONARY ANGIOPLASTY    . HALLUX VALGUS AKIN Left 02/08/2015   Procedure: HALLUX VALGUS AKIN/ KELLER;  Surgeon: Albertine Patricia, DPM;  Location: [facility] ORS;  Service: Podiatry;  Laterality: Left;   IVA with Local needs 1 hour for this case   . HEMIARTHROPLASTY HIP Right   . HEMIARTHROPLASTY HIP Left   . INCISION AND DRAINAGE Left 10/11/2014   Procedure: Removal of infected tibial sessmoid;  Surgeon: Albertine Patricia, DPM;  Location: [facility] ORS;  Service: Podiatry;  Laterality: Left;  . IRRIGATION AND DEBRIDEMENT FOOT Right 03/21/2017   Procedure: IRRIGATION AND DEBRIDEMENT FOOT right great toe amputation;  Surgeon: Sharlotte Alamo, DPM;  Location: [facility] ORS;  Service: Podiatry;  Laterality: Right;  . IRRIGATION AND DEBRIDEMENT FOOT Right 04/18/2017   Procedure: IRRIGATION AND DEBRIDEMENT FOOT and application wound vac;  Surgeon: Albertine Patricia, DPM;  Location: [facility] ORS;  Service: Podiatry;  Laterality: Right;  . PERIPHERAL VASCULAR BALLOON ANGIOPLASTY Right 04/17/2017   Procedure: PERIPHERAL VASCULAR BALLOON ANGIOPLASTY;  Surgeon: Katha Cabal, MD;  Location: [place] CV LAB;  Service: Cardiovascular;  Laterality: Right;    There were no vitals filed for this visit.  Subjective Assessment - 02/15/18 1313    Subjective  Patient reports no falls or LOB since last session. Has not been doing HEp daily but is doing it intermittently when  she has help.     Pertinent History  Patient stopped walking in march. She had a fall at home and she went to the ER and she had infections in bilateral feet. she had her great toe R foot ampuated and she had a wound on L foot. She went to the hospital and then to [facility]. She went back to the MD and had a second surgery in March and then went to another SNF for 3-4 months. She was not able to walk due to being non weight bearing. She was discharged to [facility] an assisted living facility. she had PT 2 x / week. She was able to walk short distances with RW.  She is able to dress herself, she takes herself to the bathroom with assist. She has had several falls.     Limitations  Walking    How long can you stand comfortably?  15 mins if she is holding on     Patient Stated Goals  She wants to walk again.     Currently in Pain?  No/denies    Pain Onset  1 to 4 weeks ago    Multiple Pain Sites  No           Treatment:  Nu-step x 5 mins, tactile cueing to R knee for neutral body mechanics, BUE and BLE  Gait with Rw 100 feet x 4 with poor positioning and bilat. Knees flexed with min assist and grip on her belt due to hx of knees buckling  Sit to standto RW from mat table, CGA with tactile cueing for alignment of R knee x 5 trials.  Standing x 6 mins with RW x 2 and min assist  CGA and Min to mod verbal cues used throughout with increased in postural sway and LOB most seen with narrow base of support and while on uneven surfaces. Continues to have balance deficits typical with diagnosis. Patient performs intermediate level exercises without pain behaviors and needs verbal cuing for postural alignment                     PT Education - 02/15/18 1314    Education Details  HEP    Person(s) Educated  Patient    Methods  Explanation    Comprehension  Verbalized understanding;Returned demonstration;Need further instruction       PT Short Term Goals - 12/30/17 1152      PT SHORT TERM GOAL #1   Title  Patient will be independent in home exercise program to improve strength/mobility for better functional independence with ADLs.    Baseline  inconsistent    Time  4    Period  Weeks    Status  Partially Met    Target Date  12/28/17      PT SHORT TERM GOAL #2   Title  Patient will transfer from wc to bed with SBA and RW.    Baseline  needs min assist , able to complete stand pivot transfer with RW SBA    Time  4    Period  Weeks    Status  Achieved    Target Date  12/28/17      PT SHORT TERM GOAL #3   Title  Patient will improve her 53 MW test from . 2 m/sec to . 5 m/sec with RW    Baseline  . 2 m/sec, 12/30/17: 0.2 m/s    Time  4    Period  Weeks  Status  Not Met    Target Date  12/28/17        PT  Long Term Goals - 01/25/18 1311      PT LONG TERM GOAL #1   Title  Patient will be able to perform 5 sit to stand transfers with RW using her hands to push up from a 17 inch chair    Baseline  performs sit to stand transfer with UE assist from 20 inch wc 5 x in 37 seconds1/13/20=38.58sec from 18 inch chair    Time  8    Period  Weeks    Status  Partially Met    Target Date  03/22/18      PT LONG TERM GOAL #2   Title  Patient will be able to stand x 10 mins with SBA to be able to perform standing activiites at the counter    Baseline  patient is able to stand for 6  min, 01/25/18= 7 15 seconds    Time  8    Period  Weeks    Status  Partially Met    Target Date  03/22/18      PT LONG TERM GOAL #3   Title  Patient will be able to ambulate with RW and SBA  assist x 200 feet     Baseline  Ambulates with min assist and wc following 100 feet 01/25/18= 125 feet with RW and SBA    Time  8    Period  Weeks    Status  Partially Met    Target Date  03/22/18      PT LONG TERM GOAL #4   Title  patient will impvoe BLE hip strength to 4/5 to improve mobility and safety    Baseline  01/25/18=2/5 hip abd and add,, 3/5 hip flex, unable to test hip ext    Time  8    Period  Weeks    Status  Partially Met    Target Date  03/22/18      PT LONG TERM GOAL #5   Title  Patient will improve static standing balance from poor plus to fair and be able to reach for objects on the counter top.    Baseline  poor plus static standing balance, poor plus standing balance 12/18,  01/25/18 poor plus     Time  8    Period  Weeks    Status  Not Met    Target Date  03/22/18            Plan - 02/15/18 1315    Clinical Impression Statement  Patient performs transfer training and gait training wiht min assist and poor standing posture with flexed knees and hips. Patient has no reports of pain but has fatigue in LE and UE's. She will continue to benefit from skilled PT to improve strength and mobiltiy.     Rehab  Potential  Fair    PT Frequency  2x / week    PT Duration  8 weeks    PT Treatment/Interventions  Aquatic Therapy;Gait training;Therapeutic exercise;Therapeutic activities;Functional mobility training;Balance training;Neuromuscular re-education;Patient/family education;Moist Heat    PT Next Visit Plan  LE strengthening, gait training    PT Home Exercise Plan  bridges, marching, hip abd/ER GTB, SLR    Consulted and Agree with Plan of Care  Patient;Other (Comment)   friend      Patient will benefit from skilled therapeutic intervention in order to improve the following deficits and impairments:  Abnormal  gait, Decreased balance, Decreased endurance, Decreased mobility, Difficulty walking, Impaired UE functional use, Decreased strength, Decreased activity tolerance  Visit Diagnosis: Other lack of coordination  Difficulty in walking, not elsewhere classified  Muscle weakness (generalized)     Problem List Patient Active Problem List   Diagnosis Date Noted  . Atherosclerosis of native arteries of the extremities with ulceration ([place]) 05/07/2017  . Non-healing ulcer ([place]) 04/16/2017  . Diabetic neuropathy ([place]) 04/07/2017  . Septic shock ([place]) 03/20/2017  . CKD (chronic kidney disease), stage III ([place]) 03/19/2017  . Goals of care, counseling/discussion 03/03/2017  . PAD (peripheral artery disease) ([place]) 02/03/2017  . Abdominal aortic stenosis 02/03/2017  . Bilateral lower extremity edema 02/03/2017  . Anemia 01/29/2017  . B12 deficiency 01/29/2017  . Pancreatic mass 01/29/2017  . Hyponatremia 07/07/2015  . Foot ulcer ([place]) 02/07/2015  . ARF (acute renal failure) ([place]) 11/05/2014  . Diabetic foot infection ([place]) 10/10/2014  . Chronic diastolic heart failure ([place]) 05/31/2014  . HTN (hypertension) 05/31/2014  . DM (diabetes mellitus), type 2, uncontrolled ([place]) 05/31/2014    Alanson Puls, PT DPT 02/15/2018, 2:04 PM  [place] MAIN  [facility] SERVICES [address] [place], [place], [address] Phone: [phone]   Fax:  [phone]  Name: Sharon [name] MRN: [id number] Date of Birth: February 26, 1933

## 2018-02-15 NOTE — Therapy (Signed)
[place] MAIN [facility] SERVICES [address] [place], [place], [address] Phone: [phone]   Fax:  [phone]  Occupational Therapy Treatment  Patient Details  Name: Sharon [name] MRN: [id number] Date of Birth: [birth year] Referring Provider (OT): Emily Filbert   Encounter Date: 02/15/2018  OT End of Session - 02/15/18 7989    Visit Number  8    Number of Visits  24    Date for OT Re-Evaluation  03/24/18    Authorization Type  Medicare visit 8 of 10, reporting period starting 12/30/2017    OT Start Time  1350    OT Stop Time  1430    OT Time Calculation (min)  40 min    Activity Tolerance  Patient tolerated treatment well    Behavior During Therapy  [facility] for tasks assessed/performed       Past Medical History:  Diagnosis Date  . Anemia   . Atrial fibrillation ([place])   . Cardiac arrest ([place])   . Cataract   . CHF (congestive heart failure) ([place])   . Diabetes mellitus without complication ([place])   . Edema    feet/ankles occas  . Hip fracture ([place])   . Hyperlipidemia   . Hypertension   . Hypothyroid   . Neuropathy   . Osteomyelitis ([place])    left first metatarsal  . Stroke [facility])     Past Surgical History:  Procedure Laterality Date  . ACHILLES TENDON SURGERY Left 02/08/2015   Procedure: ACHILLES LENGTHENING/KIDNER;  Surgeon: Albertine Patricia, DPM;  Location: [facility] ORS;  Service: Podiatry;  Laterality: Left;  . APPENDECTOMY    . CARDIAC CATHETERIZATION  08/25/13  . CATARACT EXTRACTION W/PHACO Left 07/20/2014   Procedure: CATARACT EXTRACTION PHACO AND INTRAOCULAR LENS PLACEMENT (IOC);  Surgeon: Leandrew Koyanagi, MD;  Location: [facility] ORS;  Service: Ophthalmology;  Laterality: Left;  [place]  1:18                 AP     23.6             CDE   9.69      lot #[phone]  . CHOLECYSTECTOMY    . CORONARY ANGIOPLASTY    . HALLUX VALGUS AKIN Left 02/08/2015   Procedure: HALLUX VALGUS AKIN/ KELLER;  Surgeon: Albertine Patricia, DPM;  Location: [facility] ORS;   Service: Podiatry;  Laterality: Left;  IVA with Local needs 1 hour for this case   . HEMIARTHROPLASTY HIP Right   . HEMIARTHROPLASTY HIP Left   . INCISION AND DRAINAGE Left 10/11/2014   Procedure: Removal of infected tibial sessmoid;  Surgeon: Albertine Patricia, DPM;  Location: [facility] ORS;  Service: Podiatry;  Laterality: Left;  . IRRIGATION AND DEBRIDEMENT FOOT Right 03/21/2017   Procedure: IRRIGATION AND DEBRIDEMENT FOOT right great toe amputation;  Surgeon: Sharlotte Alamo, DPM;  Location: [facility] ORS;  Service: Podiatry;  Laterality: Right;  . IRRIGATION AND DEBRIDEMENT FOOT Right 04/18/2017   Procedure: IRRIGATION AND DEBRIDEMENT FOOT and application wound vac;  Surgeon: Albertine Patricia, DPM;  Location: [facility] ORS;  Service: Podiatry;  Laterality: Right;  . PERIPHERAL VASCULAR BALLOON ANGIOPLASTY Right 04/17/2017   Procedure: PERIPHERAL VASCULAR BALLOON ANGIOPLASTY;  Surgeon: Katha Cabal, MD;  Location: [place] CV LAB;  Service: Cardiovascular;  Laterality: Right;    There were no vitals filed for this visit.  Subjective Assessment - 02/15/18 1435    Subjective  Pt. reports that she is doing well, and feeling better overall.    Pertinent  History Pt with an extensive history since March of this year with falls, infection in her feet, great toe of R foot amputated and has been in a skilled nursing facility for many of those months with recent transition to assistive living facility.  She did not ambulate for many months and recently able to walk short distances with an assistive device, she has had multiple falls. She complains of bilateral hand pain but especially on the right side with reports of difficulty holding items, weakness and stiffness which she reports developed suddenly a few weeks ago, she reports CT scan performed but no evidence of stroke. She does report history of arthritic changes in the hands    Patient Stated Goals  Patient reports she would like to be able to get up and walk,  use right hand for self care.  Be able to write again.    Currently in Pain?  No/denies      OT TREATMENT    Neuro muscular re-education:  Pt. worked on right hand [facility] skills grasping, and manipulating 1/2" washers from a magnetic dish using 2pt. point grasp pattern. Pt. worked on reaching up, stabilizing, and sustaining shoulder elevation while placing the washer over a small precise target on vertical dowels positioned at various angles. Pt. required the position of the dowel to be modified, and placed in a horizontal position.   Therapeutic Exercise:  Pt. performed 2# dowel ex. for UE strengthening secondary to weakness. Bilateral shoulder flexion, chest press, circular patterns, and elbow flexion/extension were performed. Pt. worked on 2# dumbbell weights for bilateral elbow flexion, and extension. Pt. requires rest breaks and verbal cues for proper ther. ex. Technique. Pt. worked on pinch strengthening in the right hand for lateral, and 3pt. pinch using yellow, and red resistive clips. Pt. worked on reaching up to place the the clips into the bucket which was positioned on an elevated plane. Tactile and verbal cues were required for eliciting the desired movement.                         OT Education - 02/15/18 1437    Education Details  UE reaching, [facility] skills.    Person(s) Educated  Patient    Methods  Explanation;Demonstration;Verbal cues    Comprehension  Verbalized understanding;Returned demonstration;Verbal cues required          OT Long Term Goals - 02/12/18 1512      OT LONG TERM GOAL #1   Title  Patient will demonstrate increase in grip strength by 5# to be able to hold utensils in right hand for self feeding.     Baseline  currently using left hand to feed self     Time  6    Period  Weeks    Status  On-going      OT LONG TERM GOAL #2   Title  Patient will demonstrate improved coordination to pick up small objects in right hand 1/2 inch in size.      Baseline  unable at eval    Time  12    Period  Weeks    Status  On-going      OT LONG TERM GOAL #3   Title  Patient to perform UB dressing with modified independence     Baseline  min assist at eval    Time  6    Period  Weeks    Status  On-going      OT LONG  TERM GOAL #4   Title  Patient will complete bathing with supervision    Baseline  moderate assist at eval     Time  12    Period  Weeks    Status  On-going      OT LONG TERM GOAL #5   Title  Patient will demonstrate HEP independently    Baseline  no current program     Time  12    Period  Weeks    Status  On-going      OT LONG TERM GOAL #6   Title  Patient will demonstrate ability to hold pen and write/sign name on important papers with 75% legibility     Baseline  difficulty holding pen and legiblity less than 25% at eval    Time  12    Period  Weeks    Status  On-going            Plan - 02/15/18 1441    Clinical Impression Statement Pt. continues to present with limited UE strength, and [facility] skills. Pt. Has right 3rd, and 4th digit extension lag. Pt. continues to work on RUE functional reaching, strengthening, and [facility] skills needed to pick up small objects, handle utensils, write legibly, and perform basic ADL, and IADL care.     Occupational Profile and client history currently impacting functional performance  History of falls, currently being seen by PT, toe amputation, balance, arthritic changes in bilateral UEs, lives alone but currently in assistive living    Occupational performance deficits (Please refer to evaluation for details):  ADL's;IADL's;Social Participation    Rehab Potential  Good    Current Impairments/barriers affecting progress:  significant weakness in R hand compared to left, balance, fall risk    OT Frequency  2x / week    OT Duration  12 weeks    OT Treatment/Interventions  Self-care/ADL training;Therapeutic exercise;Moist Heat;Neuromuscular education;Splinting;Patient/family  education;Therapeutic activities;Balance training;Contrast Bath;DME and/or AE instruction;Manual Therapy    Clinical Decision Making  Several treatment options, min-mod task modification necessary    Consulted and Agree with Plan of Care  Patient       Patient will benefit from skilled therapeutic intervention in order to improve the following deficits and impairments:  Abnormal gait, Decreased knowledge of use of DME, Impaired flexibility, Pain, Decreased coordination, Decreased mobility, Decreased activity tolerance, Decreased endurance, Decreased range of motion, Decreased strength, Decreased balance, Difficulty walking, Impaired UE functional use  Visit Diagnosis: Muscle weakness (generalized)  Other lack of coordination    Problem List Patient Active Problem List   Diagnosis Date Noted  . Atherosclerosis of native arteries of the extremities with ulceration ([place]) 05/07/2017  . Non-healing ulcer ([place]) 04/16/2017  . Diabetic neuropathy ([place]) 04/07/2017  . Septic shock ([place]) 03/20/2017  . CKD (chronic kidney disease), stage III ([place]) 03/19/2017  . Goals of care, counseling/discussion 03/03/2017  . PAD (peripheral artery disease) ([place]) 02/03/2017  . Abdominal aortic stenosis 02/03/2017  . Bilateral lower extremity edema 02/03/2017  . Anemia 01/29/2017  . B12 deficiency 01/29/2017  . Pancreatic mass 01/29/2017  . Hyponatremia 07/07/2015  . Foot ulcer ([place]) 02/07/2015  . ARF (acute renal failure) ([place]) 11/05/2014  . Diabetic foot infection ([place]) 10/10/2014  . Chronic diastolic heart failure ([place]) 05/31/2014  . HTN (hypertension) 05/31/2014  . DM (diabetes mellitus), type 2, uncontrolled ([place]) 05/31/2014    Harrel Carina, MS, OTR/L 02/15/2018, 2:50 PM  [place] MAIN [facility] SERVICES [place],  [place], [address] Phone: [phone]   Fax:  [phone]  Name: YASMENE SALOMONE MRN: [id number] Date of Birth: [birth year]

## 2018-02-17 ENCOUNTER — Ambulatory Visit: Payer: PPO | Admitting: Physical Therapy

## 2018-02-17 ENCOUNTER — Encounter: Payer: Self-pay | Admitting: Physical Therapy

## 2018-02-17 ENCOUNTER — Encounter: Payer: Self-pay | Admitting: Occupational Therapy

## 2018-02-17 ENCOUNTER — Ambulatory Visit: Payer: PPO | Admitting: Occupational Therapy

## 2018-02-17 DIAGNOSIS — R278 Other lack of coordination: Secondary | ICD-10-CM

## 2018-02-17 DIAGNOSIS — M6281 Muscle weakness (generalized): Secondary | ICD-10-CM

## 2018-02-17 DIAGNOSIS — R262 Difficulty in walking, not elsewhere classified: Secondary | ICD-10-CM

## 2018-02-17 NOTE — Therapy (Signed)
[place] [facility] SERVICES [address] [place], [place], [address] Phone: [phone]   Fax:  [phone]  Occupational Therapy Treatment  Patient Details  Name: Sharon [name] MRN: [id number] Date of Birth: [birth year] Referring Provider (OT): Emily Filbert   Encounter Date: 02/17/2018  OT End of Session - 02/17/18 9628    Visit Number  9    Number of Visits  24    Date for OT Re-Evaluation  03/24/18    Authorization Type  Progress reporting period starting 12/30/2017    OT Start Time  1345    OT Stop Time  1430    OT Time Calculation (min)  45 min    Activity Tolerance  Patient tolerated treatment well    Behavior During Therapy  [facility] for tasks assessed/performed       Past Medical History:  Diagnosis Date  . Anemia   . Atrial fibrillation ([place])   . Cardiac arrest ([place])   . Cataract   . CHF (congestive heart failure) ([place])   . Diabetes mellitus without complication ([place])   . Edema    feet/ankles occas  . Hip fracture ([place])   . Hyperlipidemia   . Hypertension   . Hypothyroid   . Neuropathy   . Osteomyelitis ([place])    left first metatarsal  . Stroke [facility])     Past Surgical History:  Procedure Laterality Date  . ACHILLES TENDON SURGERY Left 02/08/2015   Procedure: ACHILLES LENGTHENING/KIDNER;  Surgeon: Albertine Patricia, DPM;  Location: [facility] ORS;  Service: Podiatry;  Laterality: Left;  . APPENDECTOMY    . CARDIAC CATHETERIZATION  08/25/13  . CATARACT EXTRACTION W/PHACO Left 07/20/2014   Procedure: CATARACT EXTRACTION PHACO AND INTRAOCULAR LENS PLACEMENT (IOC);  Surgeon: Leandrew Koyanagi, MD;  Location: [facility] ORS;  Service: Ophthalmology;  Laterality: Left;  [place]  1:18                 AP     23.6             CDE   9.69      lot #[phone]  . CHOLECYSTECTOMY    . CORONARY ANGIOPLASTY    . HALLUX VALGUS AKIN Left 02/08/2015   Procedure: HALLUX VALGUS AKIN/ KELLER;  Surgeon: Albertine Patricia, DPM;  Location: [facility] ORS;  Service:  Podiatry;  Laterality: Left;  IVA with Local needs 1 hour for this case   . HEMIARTHROPLASTY HIP Right   . HEMIARTHROPLASTY HIP Left   . INCISION AND DRAINAGE Left 10/11/2014   Procedure: Removal of infected tibial sessmoid;  Surgeon: Albertine Patricia, DPM;  Location: [facility] ORS;  Service: Podiatry;  Laterality: Left;  . IRRIGATION AND DEBRIDEMENT FOOT Right 03/21/2017   Procedure: IRRIGATION AND DEBRIDEMENT FOOT right great toe amputation;  Surgeon: Sharlotte Alamo, DPM;  Location: [facility] ORS;  Service: Podiatry;  Laterality: Right;  . IRRIGATION AND DEBRIDEMENT FOOT Right 04/18/2017   Procedure: IRRIGATION AND DEBRIDEMENT FOOT and application wound vac;  Surgeon: Albertine Patricia, DPM;  Location: [facility] ORS;  Service: Podiatry;  Laterality: Right;  . PERIPHERAL VASCULAR BALLOON ANGIOPLASTY Right 04/17/2017   Procedure: PERIPHERAL VASCULAR BALLOON ANGIOPLASTY;  Surgeon: Katha Cabal, MD;  Location: [place] CV LAB;  Service: Cardiovascular;  Laterality: Right;    There were no vitals filed for this visit.  Subjective Assessment - 02/17/18 1742    Subjective   Pt. reports she is feeling good.    Pertinent History  Pt with an extensive history since  March of this year with falls, infection in her feet, great toe of R foot amputated and has been in a skilled nursing facility for many of those months with recent transition to assistive living facility.  She did not ambulate for many months and recently able to walk short distances with an assistive device, she has had multiple falls. She complains of bilateral hand pain but especially on the right side with reports of difficulty holding items, weakness and stiffness which she reports developed suddenly a few weeks ago, she reports CT scan performed but no evidence of stroke. She does report history of arthritic changes in the hands    Patient Stated Goals  Patient reports she would like to be able to get up and walk, use right hand for self care.  Be able  to write again.    Currently in Pain?  No/denies      OT TREATMENT    Neuro muscular re-education:  Pt. worked on right hand [facility] skills grasping 1/2" washers from a magnetic dish, and placing them onto a small horizontal dowel rod. Emphasis placed on encouraging digit extension when grasping the washers form the dish. Pt. Worked on moving flat washers to the edge of an elevated flat surface with her 2nd digit to her thumb in order to achieve a 2pt. Grasp. Emphasis was placed again on digit extension. Pt. Required cues to attempt with her 3rd digit, however hand difficulty with this. Pt. Worked on stacking the washers. Pt. did drop several washers.   Therapeutic Exercise:  Pt. performed 2# dowel ex. For BUE strengthening secondary to weakness. Bilateral shoulder flexion, chest press, and circular patterns. 1# dumbbell ex. for elbow flexion and extension, forearm supination/pronation, and wrist flexion/extension. Pt. requires rest breaks and verbal cues for proper technique. Pt. Worked on digit extension exercises with hand flat at the tabletop surface. Pt. Was able to achieve digit 2nd, and 5th digit extension consistently.                        OT Education - 02/17/18 1742    Education Details  [facility] skills.    Person(s) Educated  Patient    Methods  Explanation;Demonstration;Verbal cues    Comprehension  Verbalized understanding;Returned demonstration;Verbal cues required          OT Long Term Goals - 02/12/18 1512      OT LONG TERM GOAL #1   Title  Patient will demonstrate increase in grip strength by 5# to be able to hold utensils in right hand for self feeding.     Baseline  currently using left hand to feed self     Time  6    Period  Weeks    Status  On-going      OT LONG TERM GOAL #2   Title  Patient will demonstrate improved coordination to pick up small objects in right hand 1/2 inch in size.     Baseline  unable at eval    Time  12    Period  Weeks     Status  On-going      OT LONG TERM GOAL #3   Title  Patient to perform UB dressing with modified independence     Baseline  min assist at eval    Time  6    Period  Weeks    Status  On-going      OT LONG TERM GOAL #4   Title  Patient  will complete bathing with supervision    Baseline  moderate assist at eval     Time  12    Period  Weeks    Status  On-going      OT LONG TERM GOAL #5   Title  Patient will demonstrate HEP independently    Baseline  no current program     Time  12    Period  Weeks    Status  On-going      OT LONG TERM GOAL #6   Title  Patient will demonstrate ability to hold pen and write/sign name on important papers with 75% legibility     Baseline  difficulty holding pen and legiblity less than 25% at eval    Time  12    Period  Weeks    Status  On-going            Plan - 02/17/18 1744    Clinical Impression Statement  Pt. continues to present with right hand 3rd, and 4th digit extension lag. Pt. has improved with grasping, and maintaining the grasp on her on objects. Pt. continues to work on improving right hand function,UE strength, and [facility] skills to be able to hold utensils, manipulate small objects, and write legibly.     Occupational Profile and client history currently impacting functional performance  History of falls, currently being seen by PT, toe amputation, balance, arthritic changes in bilateral UEs, lives alone but currently in assistive living    Occupational performance deficits (Please refer to evaluation for details):  ADL's;IADL's;Social Participation    Rehab Potential  Good    Current Impairments/barriers affecting progress:  significant weakness in R hand compared to left, balance, fall risk    OT Frequency  2x / week    OT Duration  12 weeks    OT Treatment/Interventions  Self-care/ADL training;Therapeutic exercise;Moist Heat;Neuromuscular education;Splinting;Patient/family education;Therapeutic activities;Balance  training;Contrast Bath;DME and/or AE instruction;Manual Therapy    Clinical Decision Making  Several treatment options, min-mod task modification necessary    Consulted and Agree with Plan of Care  Patient       Patient will benefit from skilled therapeutic intervention in order to improve the following deficits and impairments:  Abnormal gait, Decreased knowledge of use of DME, Impaired flexibility, Pain, Decreased coordination, Decreased mobility, Decreased activity tolerance, Decreased endurance, Decreased range of motion, Decreased strength, Decreased balance, Difficulty walking, Impaired UE functional use  Visit Diagnosis: Muscle weakness (generalized)  Other lack of coordination    Problem List Patient Active Problem List   Diagnosis Date Noted  . Atherosclerosis of native arteries of the extremities with ulceration ([place]) 05/07/2017  . Non-healing ulcer ([place]) 04/16/2017  . Diabetic neuropathy ([place]) 04/07/2017  . Septic shock ([place]) 03/20/2017  . CKD (chronic kidney disease), stage III ([place]) 03/19/2017  . Goals of care, counseling/discussion 03/03/2017  . PAD (peripheral artery disease) ([place]) 02/03/2017  . Abdominal aortic stenosis 02/03/2017  . Bilateral lower extremity edema 02/03/2017  . Anemia 01/29/2017  . B12 deficiency 01/29/2017  . Pancreatic mass 01/29/2017  . Hyponatremia 07/07/2015  . Foot ulcer ([place]) 02/07/2015  . ARF (acute renal failure) ([place]) 11/05/2014  . Diabetic foot infection ([place]) 10/10/2014  . Chronic diastolic heart failure ([place]) 05/31/2014  . HTN (hypertension) 05/31/2014  . DM (diabetes mellitus), type 2, uncontrolled ([place]) 05/31/2014    Harrel Carina, MS, OTR/L 02/17/2018, 5:51 PM  [place] MAIN [facility] SERVICES [address] [place], [place], [address] Phone: [phone]  Fax:  [phone]  Name: MALAAK STACH MRN: [id number] Date of Birth: 1933-07-29

## 2018-02-17 NOTE — Therapy (Signed)
[place] MAIN [facility] SERVICES [address] [place], [place], [address] Phone: [phone]   Fax:  [phone]  Physical Therapy Treatment  Patient Details  Name: Sharon [name] MRN: [id number] Date of Birth: 09-07-1933 Referring Provider (PT): [place], [place]   Encounter Date: 02/17/2018  PT End of Session - 02/17/18 1440    Visit Number  17    Number of Visits  17    Date for PT Re-Evaluation  03/22/18    Authorization Type  7/10    PT Start Time  0100    PT Stop Time  0145    PT Time Calculation (min)  45 min    Equipment Utilized During Treatment  Gait belt    Activity Tolerance  Patient tolerated treatment well;Patient limited by fatigue       Past Medical History:  Diagnosis Date  . Anemia   . Atrial fibrillation ([place])   . Cardiac arrest ([place])   . Cataract   . CHF (congestive heart failure) ([place])   . Diabetes mellitus without complication ([place])   . Edema    feet/ankles occas  . Hip fracture ([place])   . Hyperlipidemia   . Hypertension   . Hypothyroid   . Neuropathy   . Osteomyelitis ([place])    left first metatarsal  . Stroke [facility])     Past Surgical History:  Procedure Laterality Date  . ACHILLES TENDON SURGERY Left 02/08/2015   Procedure: ACHILLES LENGTHENING/KIDNER;  Surgeon: Albertine Patricia, DPM;  Location: [facility] ORS;  Service: Podiatry;  Laterality: Left;  . APPENDECTOMY    . CARDIAC CATHETERIZATION  08/25/13  . CATARACT EXTRACTION W/PHACO Left 07/20/2014   Procedure: CATARACT EXTRACTION PHACO AND INTRAOCULAR LENS PLACEMENT (IOC);  Surgeon: Leandrew Koyanagi, MD;  Location: [facility] ORS;  Service: Ophthalmology;  Laterality: Left;  [place]  1:18                 AP     23.6             CDE   9.69      lot #[phone]  . CHOLECYSTECTOMY    . CORONARY ANGIOPLASTY    . HALLUX VALGUS AKIN Left 02/08/2015   Procedure: HALLUX VALGUS AKIN/ KELLER;  Surgeon: Albertine Patricia, DPM;  Location: [facility] ORS;  Service: Podiatry;  Laterality: Left;   IVA with Local needs 1 hour for this case   . HEMIARTHROPLASTY HIP Right   . HEMIARTHROPLASTY HIP Left   . INCISION AND DRAINAGE Left 10/11/2014   Procedure: Removal of infected tibial sessmoid;  Surgeon: Albertine Patricia, DPM;  Location: [facility] ORS;  Service: Podiatry;  Laterality: Left;  . IRRIGATION AND DEBRIDEMENT FOOT Right 03/21/2017   Procedure: IRRIGATION AND DEBRIDEMENT FOOT right great toe amputation;  Surgeon: Sharlotte Alamo, DPM;  Location: [facility] ORS;  Service: Podiatry;  Laterality: Right;  . IRRIGATION AND DEBRIDEMENT FOOT Right 04/18/2017   Procedure: IRRIGATION AND DEBRIDEMENT FOOT and application wound vac;  Surgeon: Albertine Patricia, DPM;  Location: [facility] ORS;  Service: Podiatry;  Laterality: Right;  . PERIPHERAL VASCULAR BALLOON ANGIOPLASTY Right 04/17/2017   Procedure: PERIPHERAL VASCULAR BALLOON ANGIOPLASTY;  Surgeon: Katha Cabal, MD;  Location: [place] CV LAB;  Service: Cardiovascular;  Laterality: Right;    There were no vitals filed for this visit.  Subjective Assessment - 02/17/18 1439    Subjective  Patient reports no falls or LOB since last session. Has been doing HEp daily.     Pertinent History  Patient stopped walking in march. She had a fall at home and she went to the ER and she had infections in bilateral feet. she had her great toe R foot ampuated and she had a wound on L foot. She went to the hospital and then to [facility]. She went back to the MD and had a second surgery in March and then went to another SNF for 3-4 months. She was not able to walk due to being non weight bearing. She was discharged to [facility] an assisted living facility. she had PT 2 x / week. She was able to walk short distances with RW.  She is able to dress herself, she takes herself to the bathroom with assist. She has had several falls.     Limitations  Walking    How long can you stand comfortably?  15 mins if she is holding on    Patient Stated Goals  She wants to walk again.      Currently in Pain?  No/denies    Pain Score  0-No pain    Pain Onset  1 to 4 weeks ago       Treatment:  Nu-step x 5 mins, tactile cueing to R knee for neutral body mechanics, BUE and BLE  Gait with Rw 75 feet x 6 with poor positioning and bilat. Knees flexed with min assist and grip on her belt due to hx of knees buckling  Sit to standto RWfrom mattable, CGA with tactile cueing for alignment of R knee x 5 trials.  Standing x 6 mins with RW x 2 and min assist  CGA and Min to mod verbal cues used throughout with increased in postural sway and LOB most seen with narrow base of support and while on uneven surfaces. Continues to have balance deficits typical with diagnosis. Patient performs intermediate level exercises without pain behaviors and needs verbal cuing for postural alignment                        PT Education - 02/17/18 1440    Education Details  HEP    Person(s) Educated  Patient    Methods  Explanation;Demonstration;Tactile cues    Comprehension  Returned demonstration;Verbalized understanding;Need further instruction       PT Short Term Goals - 12/30/17 1152      PT SHORT TERM GOAL #1   Title  Patient will be independent in home exercise program to improve strength/mobility for better functional independence with ADLs.    Baseline  inconsistent    Time  4    Period  Weeks    Status  Partially Met    Target Date  12/28/17      PT SHORT TERM GOAL #2   Title  Patient will transfer from wc to bed with SBA and RW.    Baseline  needs min assist , able to complete stand pivot transfer with RW SBA    Time  4    Period  Weeks    Status  Achieved    Target Date  12/28/17      PT SHORT TERM GOAL #3   Title  Patient will improve her 42 MW test from . 2 m/sec to . 5 m/sec with RW    Baseline  . 2 m/sec, 12/30/17: 0.2 m/s    Time  4    Period  Weeks    Status  Not Met    Target Date  12/28/17  PT Long Term Goals - 01/25/18 1311       PT LONG TERM GOAL #1   Title  Patient will be able to perform 5 sit to stand transfers with RW using her hands to push up from a 17 inch chair    Baseline  performs sit to stand transfer with UE assist from 20 inch wc 5 x in 37 seconds1/13/20=38.58sec from 18 inch chair    Time  8    Period  Weeks    Status  Partially Met    Target Date  03/22/18      PT LONG TERM GOAL #2   Title  Patient will be able to stand x 10 mins with SBA to be able to perform standing activiites at the counter    Baseline  patient is able to stand for 6  min, 01/25/18= 7 15 seconds    Time  8    Period  Weeks    Status  Partially Met    Target Date  03/22/18      PT LONG TERM GOAL #3   Title  Patient will be able to ambulate with RW and SBA  assist x 200 feet     Baseline  Ambulates with min assist and wc following 100 feet 01/25/18= 125 feet with RW and SBA    Time  8    Period  Weeks    Status  Partially Met    Target Date  03/22/18      PT LONG TERM GOAL #4   Title  patient will impvoe BLE hip strength to 4/5 to improve mobility and safety    Baseline  01/25/18=2/5 hip abd and add,, 3/5 hip flex, unable to test hip ext    Time  8    Period  Weeks    Status  Partially Met    Target Date  03/22/18      PT LONG TERM GOAL #5   Title  Patient will improve static standing balance from poor plus to fair and be able to reach for objects on the counter top.    Baseline  poor plus static standing balance, poor plus standing balance 12/18,  01/25/18 poor plus     Time  8    Period  Weeks    Status  Not Met    Target Date  03/22/18            Plan - 02/17/18 1442    Clinical Impression Statement  Bilateral knee flexion was noted throughout session with exacerbation from fatigue with ambulation creating a hinge in hip flexors as well reducing step length and stability. Limited control of right LE muscle contraction noted with improved mechanics/coordination with repetition. skilled PT in order to  increase gait speed, increase BLE strength, and improve dynamic standing balance to decrease risk for falls and enable patient to participate in desired activities.    Rehab Potential  Fair    PT Frequency  2x / week    PT Duration  8 weeks    PT Treatment/Interventions  Aquatic Therapy;Gait training;Therapeutic exercise;Therapeutic activities;Functional mobility training;Balance training;Neuromuscular re-education;Patient/family education;Moist Heat    PT Next Visit Plan  LE strengthening, gait training    PT Home Exercise Plan  bridges, marching, hip abd/ER GTB, SLR    Consulted and Agree with Plan of Care  Patient;Other (Comment)   friend      Patient will benefit from skilled therapeutic intervention in order to improve the following deficits  and impairments:  Abnormal gait, Decreased balance, Decreased endurance, Decreased mobility, Difficulty walking, Impaired UE functional use, Decreased strength, Decreased activity tolerance  Visit Diagnosis: Muscle weakness (generalized)  Other lack of coordination  Difficulty in walking, not elsewhere classified     Problem List Patient Active Problem List   Diagnosis Date Noted  . Atherosclerosis of native arteries of the extremities with ulceration ([place]) 05/07/2017  . Non-healing ulcer ([place]) 04/16/2017  . Diabetic neuropathy ([place]) 04/07/2017  . Septic shock ([place]) 03/20/2017  . CKD (chronic kidney disease), stage III ([place]) 03/19/2017  . Goals of care, counseling/discussion 03/03/2017  . PAD (peripheral artery disease) ([place]) 02/03/2017  . Abdominal aortic stenosis 02/03/2017  . Bilateral lower extremity edema 02/03/2017  . Anemia 01/29/2017  . B12 deficiency 01/29/2017  . Pancreatic mass 01/29/2017  . Hyponatremia 07/07/2015  . Foot ulcer ([place]) 02/07/2015  . ARF (acute renal failure) ([place]) 11/05/2014  . Diabetic foot infection ([place]) 10/10/2014  . Chronic diastolic heart failure ([place]) 05/31/2014  . HTN (hypertension) 05/31/2014   . DM (diabetes mellitus), type 2, uncontrolled ([place]) 05/31/2014    Alanson Puls, PT DPT 02/17/2018, 2:45 PM  [place] MAIN [facility] SERVICES [address] [place], [place], [address] Phone: [phone]   Fax:  [phone]  Name: Sharon [name] MRN: [id number] Date of Birth: May 28, 1933

## 2018-02-22 ENCOUNTER — Ambulatory Visit: Payer: PPO | Admitting: Physical Therapy

## 2018-02-22 ENCOUNTER — Ambulatory Visit: Payer: PPO | Admitting: Occupational Therapy

## 2018-02-22 ENCOUNTER — Encounter: Payer: Self-pay | Admitting: Occupational Therapy

## 2018-02-22 ENCOUNTER — Encounter: Payer: Self-pay | Admitting: Physical Therapy

## 2018-02-22 DIAGNOSIS — R262 Difficulty in walking, not elsewhere classified: Secondary | ICD-10-CM

## 2018-02-22 DIAGNOSIS — R278 Other lack of coordination: Secondary | ICD-10-CM

## 2018-02-22 DIAGNOSIS — M6281 Muscle weakness (generalized): Secondary | ICD-10-CM

## 2018-02-22 NOTE — Therapy (Signed)
[place] MAIN [facility] SERVICES [address] [place], [place], [address] Phone: [phone]   Fax:  [phone]  Physical Therapy Treatment  Patient Details  Name: Sharon [name] MRN: [id number] Date of Birth: 11/30/1933 Referring Provider (PT): [place], [place]   Encounter Date: 02/22/2018  PT End of Session - 02/22/18 1353    Visit Number  18    Number of Visits  34    Date for PT Re-Evaluation  03/22/18    Authorization Type  8/10    PT Start Time  0103    PT Stop Time  0145    PT Time Calculation (min)  42 min    Equipment Utilized During Treatment  Gait belt    Activity Tolerance  Patient tolerated treatment well;Patient limited by fatigue       Past Medical History:  Diagnosis Date  . Anemia   . Atrial fibrillation ([place])   . Cardiac arrest ([place])   . Cataract   . CHF (congestive heart failure) ([place])   . Diabetes mellitus without complication ([place])   . Edema    feet/ankles occas  . Hip fracture ([place])   . Hyperlipidemia   . Hypertension   . Hypothyroid   . Neuropathy   . Osteomyelitis ([place])    left first metatarsal  . Stroke [facility])     Past Surgical History:  Procedure Laterality Date  . ACHILLES TENDON SURGERY Left 02/08/2015   Procedure: ACHILLES LENGTHENING/KIDNER;  Surgeon: Albertine Patricia, DPM;  Location: [facility] ORS;  Service: Podiatry;  Laterality: Left;  . APPENDECTOMY    . CARDIAC CATHETERIZATION  08/25/13  . CATARACT EXTRACTION W/PHACO Left 07/20/2014   Procedure: CATARACT EXTRACTION PHACO AND INTRAOCULAR LENS PLACEMENT (IOC);  Surgeon: Leandrew Koyanagi, MD;  Location: [facility] ORS;  Service: Ophthalmology;  Laterality: Left;  [place]  1:18                 AP     23.6             CDE   9.69      lot #[phone]  . CHOLECYSTECTOMY    . CORONARY ANGIOPLASTY    . HALLUX VALGUS AKIN Left 02/08/2015   Procedure: HALLUX VALGUS AKIN/ KELLER;  Surgeon: Albertine Patricia, DPM;  Location: [facility] ORS;  Service: Podiatry;  Laterality:  Left;  IVA with Local needs 1 hour for this case   . HEMIARTHROPLASTY HIP Right   . HEMIARTHROPLASTY HIP Left   . INCISION AND DRAINAGE Left 10/11/2014   Procedure: Removal of infected tibial sessmoid;  Surgeon: Albertine Patricia, DPM;  Location: [facility] ORS;  Service: Podiatry;  Laterality: Left;  . IRRIGATION AND DEBRIDEMENT FOOT Right 03/21/2017   Procedure: IRRIGATION AND DEBRIDEMENT FOOT right great toe amputation;  Surgeon: Sharlotte Alamo, DPM;  Location: [facility] ORS;  Service: Podiatry;  Laterality: Right;  . IRRIGATION AND DEBRIDEMENT FOOT Right 04/18/2017   Procedure: IRRIGATION AND DEBRIDEMENT FOOT and application wound vac;  Surgeon: Albertine Patricia, DPM;  Location: [facility] ORS;  Service: Podiatry;  Laterality: Right;  . PERIPHERAL VASCULAR BALLOON ANGIOPLASTY Right 04/17/2017   Procedure: PERIPHERAL VASCULAR BALLOON ANGIOPLASTY;  Surgeon: Katha Cabal, MD;  Location: [place] [facility];  Service: Cardiovascular;  Laterality: Right;    There were no vitals filed for this visit.  Subjective Assessment - 02/22/18 1352    Subjective  Patient reports no falls or LOB since last session. Has been doing HEp daily.     Pertinent History  Patient stopped walking in march. She had a fall at home and she went to the ER and she had infections in bilateral feet. she had her great toe R foot ampuated and she had a wound on L foot. She went to the hospital and then to [facility]. She went back to the MD and had a second surgery in March and then went to another SNF for 3-4 months. She was not able to walk due to being non weight bearing. She was discharged to [facility] an assisted living facility. she had PT 2 x / week. She was able to walk short distances with RW.  She is able to dress herself, she takes herself to the bathroom with assist. She has had several falls.     Limitations  Walking    How long can you stand comfortably?  15 mins if she is holding on    Patient Stated Goals  She wants to walk  again.     Currently in Pain?  No/denies    Pain Score  0-No pain    Pain Onset  1 to 4 weeks ago       Treatment:  Nu-step x 5 mins, tactile cueing to R knee for neutral body mechanics, BUE and BLE  Gait with Rw 75 feetx 3, 100 x 2 with poor positioning and bilat. Knees flexed with min assist and grip on her belt due to hx of knees buckling  Sit to standto RWfrom mattable, CGA with tactile cueing for alignment of R knee x 5 trials.  Standing hip extension, abd x 10 with poor form and weakness and  min assist  Standing marching x 10 BLE , with cGA  CGA and Min to mod verbal cues used throughout with increased in postural sway and LOB most seen with narrow base of support and while on uneven surfaces. Continues to have balance deficits typical with diagnosis. Patient performs intermediate level exercises without pain behaviors and needs verbal cuing for postural alignment                        PT Education - 02/22/18 1353    Education Details  HEP    Person(s) Educated  Patient    Methods  Explanation    Comprehension  Returned demonstration;Need further instruction       PT Short Term Goals - 12/30/17 1152      PT SHORT TERM GOAL #1   Title  Patient will be independent in home exercise program to improve strength/mobility for better functional independence with ADLs.    Baseline  inconsistent    Time  4    Period  Weeks    Status  Partially Met    Target Date  12/28/17      PT SHORT TERM GOAL #2   Title  Patient will transfer from wc to bed with SBA and RW.    Baseline  needs min assist , able to complete stand pivot transfer with RW SBA    Time  4    Period  Weeks    Status  Achieved    Target Date  12/28/17      PT SHORT TERM GOAL #3   Title  Patient will improve her 79 MW test from . 2 m/sec to . 5 m/sec with RW    Baseline  . 2 m/sec, 12/30/17: 0.2 m/s    Time  4    Period  Weeks  Status  Not Met    Target Date  12/28/17         PT Long Term Goals - 01/25/18 1311      PT LONG TERM GOAL #1   Title  Patient will be able to perform 5 sit to stand transfers with RW using her hands to push up from a 17 inch chair    Baseline  performs sit to stand transfer with UE assist from 20 inch wc 5 x in 37 seconds1/13/20=38.58sec from 18 inch chair    Time  8    Period  Weeks    Status  Partially Met    Target Date  03/22/18      PT LONG TERM GOAL #2   Title  Patient will be able to stand x 10 mins with SBA to be able to perform standing activiites at the counter    Baseline  patient is able to stand for 6  min, 01/25/18= 7 15 seconds    Time  8    Period  Weeks    Status  Partially Met    Target Date  03/22/18      PT LONG TERM GOAL #3   Title  Patient will be able to ambulate with RW and SBA  assist x 200 feet     Baseline  Ambulates with min assist and wc following 100 feet 01/25/18= 125 feet with RW and SBA    Time  8    Period  Weeks    Status  Partially Met    Target Date  03/22/18      PT LONG TERM GOAL #4   Title  patient will impvoe BLE hip strength to 4/5 to improve mobility and safety    Baseline  01/25/18=2/5 hip abd and add,, 3/5 hip flex, unable to test hip ext    Time  8    Period  Weeks    Status  Partially Met    Target Date  03/22/18      PT LONG TERM GOAL #5   Title  Patient will improve static standing balance from poor plus to fair and be able to reach for objects on the counter top.    Baseline  poor plus static standing balance, poor plus standing balance 12/18,  01/25/18 poor plus     Time  8    Period  Weeks    Status  Not Met    Target Date  03/22/18            Plan - 02/22/18 1353    Clinical Impression Statement  Patient performs transfer training and gait training wiht min assist and poor standing posture with flexed knees and hips. Patient has no reports of pain but has fatigue in LE and UE's. She will continue to benefit from skilled PT to improve strength and mobiltiy     Rehab Potential  Fair    PT Frequency  2x / week    PT Duration  8 weeks    PT Treatment/Interventions  Aquatic Therapy;Gait training;Therapeutic exercise;Therapeutic activities;Functional mobility training;Balance training;Neuromuscular re-education;Patient/family education;Moist Heat    PT Next Visit Plan  LE strengthening, gait training    PT Home Exercise Plan  bridges, marching, hip abd/ER GTB, SLR    Consulted and Agree with Plan of Care  Patient;Other (Comment)   friend      Patient will benefit from skilled therapeutic intervention in order to improve the following deficits and impairments:  Abnormal gait,  Decreased balance, Decreased endurance, Decreased mobility, Difficulty walking, Impaired UE functional use, Decreased strength, Decreased activity tolerance  Visit Diagnosis: Muscle weakness (generalized)  Other lack of coordination  Difficulty in walking, not elsewhere classified     Problem List Patient Active Problem List   Diagnosis Date Noted  . Atherosclerosis of native arteries of the extremities with ulceration ([place]) 05/07/2017  . Non-healing ulcer ([place]) 04/16/2017  . Diabetic neuropathy ([place]) 04/07/2017  . Septic shock ([place]) 03/20/2017  . CKD (chronic kidney disease), stage III ([place]) 03/19/2017  . Goals of care, counseling/discussion 03/03/2017  . PAD (peripheral artery disease) ([place]) 02/03/2017  . Abdominal aortic stenosis 02/03/2017  . Bilateral lower extremity edema 02/03/2017  . Anemia 01/29/2017  . B12 deficiency 01/29/2017  . Pancreatic mass 01/29/2017  . Hyponatremia 07/07/2015  . Foot ulcer ([place]) 02/07/2015  . ARF (acute renal failure) ([place]) 11/05/2014  . Diabetic foot infection ([place]) 10/10/2014  . Chronic diastolic heart failure ([place]) 05/31/2014  . HTN (hypertension) 05/31/2014  . DM (diabetes mellitus), type 2, uncontrolled ([place]) 05/31/2014    Alanson Puls, PT DPT 02/22/2018, 1:55 PM  [place] MAIN [facility] SERVICES [address] [place], [place], [address] Phone: [phone]   Fax:  [phone]  Name: Sharon [name] MRN: [id number] Date of Birth: 1933/11/02

## 2018-02-22 NOTE — Therapy (Signed)
[place] MAIN [facility] SERVICES [address] [place], [place], [address] Phone: [phone]   Fax:  [phone]  Occupational Therapy Treatment/Occupational Therapy Progress Note  Dates of reporting period  12/30/2017   to   02/22/2018  Patient Details  Name: Sharon [name] MRN: [id number] Date of Birth: 1933-08-29 Referring Provider (OT): Emily Filbert   Encounter Date: 02/22/2018  OT End of Session - 02/22/18 1556    Visit Number  10    Number of Visits  24    Date for OT Re-Evaluation  03/24/18    Authorization Type  Progress reporting period starting 02/22/2018   OT Start Time  1345    OT Stop Time  1430    OT Time Calculation (min)  45 min    Activity Tolerance  Patient tolerated treatment well    Behavior During Therapy  [facility] for tasks assessed/performed       Past Medical History:  Diagnosis Date  . Anemia   . Atrial fibrillation ([place])   . Cardiac arrest ([place])   . Cataract   . CHF (congestive heart failure) ([place])   . Diabetes mellitus without complication ([place])   . Edema    feet/ankles occas  . Hip fracture ([place])   . Hyperlipidemia   . Hypertension   . Hypothyroid   . Neuropathy   . Osteomyelitis ([place])    left first metatarsal  . Stroke [facility])     Past Surgical History:  Procedure Laterality Date  . ACHILLES TENDON SURGERY Left 02/08/2015   Procedure: ACHILLES LENGTHENING/KIDNER;  Surgeon: Albertine Patricia, DPM;  Location: [facility] ORS;  Service: Podiatry;  Laterality: Left;  . APPENDECTOMY    . CARDIAC CATHETERIZATION  08/25/13  . CATARACT EXTRACTION W/PHACO Left 07/20/2014   Procedure: CATARACT EXTRACTION PHACO AND INTRAOCULAR LENS PLACEMENT (IOC);  Surgeon: Leandrew Koyanagi, MD;  Location: [facility] ORS;  Service: Ophthalmology;  Laterality: Left;  [place]  1:18                 AP     23.6             CDE   9.69      lot #[phone]  . CHOLECYSTECTOMY    . CORONARY ANGIOPLASTY    . HALLUX VALGUS AKIN Left 02/08/2015   Procedure:  HALLUX VALGUS AKIN/ KELLER;  Surgeon: Albertine Patricia, DPM;  Location: [facility] ORS;  Service: Podiatry;  Laterality: Left;  IVA with Local needs 1 hour for this case   . HEMIARTHROPLASTY HIP Right   . HEMIARTHROPLASTY HIP Left   . INCISION AND DRAINAGE Left 10/11/2014   Procedure: Removal of infected tibial sessmoid;  Surgeon: Albertine Patricia, DPM;  Location: [facility] ORS;  Service: Podiatry;  Laterality: Left;  . IRRIGATION AND DEBRIDEMENT FOOT Right 03/21/2017   Procedure: IRRIGATION AND DEBRIDEMENT FOOT right great toe amputation;  Surgeon: Sharlotte Alamo, DPM;  Location: [facility] ORS;  Service: Podiatry;  Laterality: Right;  . IRRIGATION AND DEBRIDEMENT FOOT Right 04/18/2017   Procedure: IRRIGATION AND DEBRIDEMENT FOOT and application wound vac;  Surgeon: Albertine Patricia, DPM;  Location: [facility] OR[facility];  Service: Podiatry;  Laterality: Right;  . PERIPHERAL VASCULAR BALLOON ANGIOPLASTY Right 04/17/2017   Procedure: PERIPHERAL VASCULAR BALLOON ANGIOPLASTY;  Surgeon: Katha Cabal, MD;  Location: [place] [facility];  Service: Cardiovascular;  Laterality: Right;    There were no vitals filed for this visit.  Subjective Assessment - 02/22/18 1555    Subjective   Pt. reports that  she is feeling well.    Pertinent History  Pt with an extensive history since March of this year with falls, infection in her feet, great toe of R foot amputated and has been in a skilled nursing facility for many of those months with recent transition to assistive living facility.  She did not ambulate for many months and recently able to walk short distances with an assistive device, she has had multiple falls. She complains of bilateral hand pain but especially on the right side with reports of difficulty holding items, weakness and stiffness which she reports developed suddenly a few weeks ago, she reports CT scan performed but no evidence of stroke. She does report history of arthritic changes in the hands    Patient Stated Goals   Patient reports she would like to be able to get up and walk, use right hand for self care.  Be able to write again.    Currently in Pain?  No/denies      OT TREATMENT    Neuro muscular re-education:  Pt. worked on right [facility] skills grasping, and manipulating flat coins by sliding the coins off the edge of a flat elevated surface with the tip of her 2nd digit to her thumb, followed by her 3rd digit to her thumb. Pt. worked on grasping, and placing 1" cubes onto a resistive board. Pt. worked on extending and isolating her 2nd, and 3rd digit to press them into place. Pt. worked on flipping cards alternating using thumb on finger, and finger on thumb movement patterns. Pt. Worked on these tasks to encourage digit extension. Pt. Required verbal cues to actively extend her digits prior to performing reps.  Therapeutic Exercise:  Pt. worked on right hand digit extension exercises with her hand positioned flat at the tabletop surface.                             OT Education - 02/22/18 1556    Education Details  digit extension, [facility] skills    Person(s) Educated  Patient    Methods  Explanation;Demonstration;Verbal cues    Comprehension  Verbalized understanding;Returned demonstration;Verbal cues required          OT Long Term Goals - 02/22/18 1557      OT LONG TERM GOAL #1   Title  Patient will demonstrate increase in grip strength by 5# to be able to hold utensils in right hand for self feeding.     Baseline  currently using left hand to feed self     Time  6    Period  Weeks     Status  On-going        OT LONG TERM GOAL #2   Target Date  03/24/18       OT LONG TERM GOAL #3   Title  Patient to perform UB dressing with modified independence   (Pended)     Baseline  min assist    Time  6     Period  Weeks     Status  On-going     Target Date  03/24/18       OT LONG TERM GOAL #4   Title  Patient will complete bathing with supervision    Baseline   moderate assist    Period  Weeks     Status  On-going    Target Date  03/24/18       OT LONG TERM GOAL #5  Title  Patient will demonstrate HEP independently    Baseline  no current program    Time  12     Period  Weeks     Status  On-going     Target Date  03/24/18       OT LONG TERM GOAL #6   Title  Patient will demonstrate ability to hold pen and write/sign name on important papers with 75% legibility   (Pended)     Baseline  difficulty holding pen and legiblity less than 25% at eval     Time  12     Period  Weeks    Status  On-going     Target Date  03/24/18             Plan - 02/22/18 1556    Clinical Impression Statement Pt. continues to present with a right hand 3rd, and 4th digit extension lag. Pt. has limited RUE strength, and [facility] skills which makes it difficlut to complete ADL, and IADL tasks. Pt. continues to work on improving right hand 3rd, and 4th digit MP, PIP, and DIP extension, overall RUE strength, and hand function in order to improve engagement with ADL tasks, and maximize overall ADL, and IADL independence.    Occupational Profile and client history currently impacting functional performance  History of falls, currently being seen by PT, toe amputation, balance, arthritic changes in bilateral UEs, lives alone but currently in assistive living    Occupational performance deficits (Please refer to evaluation for details):  ADL's;IADL's;Social Participation    Rehab Potential  Good    Current Impairments/barriers affecting progress:  significant weakness in R hand compared to left, balance, fall risk    OT Frequency  2x / week    OT Duration  12 weeks    OT Treatment/Interventions  Self-care/ADL training;Therapeutic exercise;Moist Heat;Neuromuscular education;Splinting;Patient/family education;Therapeutic activities;Balance training;Contrast Bath;DME and/or AE instruction;Manual Therapy    Clinical Decision Making  Several treatment options, min-mod task  modification necessary    Consulted and Agree with Plan of Care  Patient       Patient will benefit from skilled therapeutic intervention in order to improve the following deficits and impairments:  Abnormal gait, Decreased knowledge of use of DME, Impaired flexibility, Pain, Decreased coordination, Decreased mobility, Decreased activity tolerance, Decreased endurance, Decreased range of motion, Decreased strength, Decreased balance, Difficulty walking, Impaired UE functional use  Visit Diagnosis: Muscle weakness (generalized)  Other lack of coordination    Problem List Patient Active Problem List   Diagnosis Date Noted  . Atherosclerosis of native arteries of the extremities with ulceration ([place]) 05/07/2017  . Non-healing ulcer ([place]) 04/16/2017  . Diabetic neuropathy ([place]) 04/07/2017  . Septic shock ([place]) 03/20/2017  . CKD (chronic kidney disease), stage III ([place]) 03/19/2017  . Goals of care, counseling/discussion 03/03/2017  . PAD (peripheral artery disease) ([place]) 02/03/2017  . Abdominal aortic stenosis 02/03/2017  . Bilateral lower extremity edema 02/03/2017  . Anemia 01/29/2017  . B12 deficiency 01/29/2017  . Pancreatic mass 01/29/2017  . Hyponatremia 07/07/2015  . Foot ulcer ([place]) 02/07/2015  . ARF (acute renal failure) ([place]) 11/05/2014  . Diabetic foot infection ([place]) 10/10/2014  . Chronic diastolic heart failure ([place]) 05/31/2014  . HTN (hypertension) 05/31/2014  . DM (diabetes mellitus), type 2, uncontrolled ([place]) 05/31/2014    Harrel Carina, MS, OTR/L 02/22/2018, 6:15 PM  [place] MAIN [facility] SERVICES [address] [place], [place], [address] Phone: [phone]   Fax:  [phone]  Name: ESTY AHUJA MRN: [id number] Date of Birth: 10-Mar-1933

## 2018-02-24 ENCOUNTER — Encounter: Payer: Self-pay | Admitting: Occupational Therapy

## 2018-02-24 ENCOUNTER — Ambulatory Visit: Payer: PPO | Admitting: Physical Therapy

## 2018-02-24 ENCOUNTER — Ambulatory Visit: Payer: PPO | Admitting: Occupational Therapy

## 2018-02-24 ENCOUNTER — Encounter: Payer: Self-pay | Admitting: Physical Therapy

## 2018-02-24 DIAGNOSIS — M6281 Muscle weakness (generalized): Secondary | ICD-10-CM

## 2018-02-24 DIAGNOSIS — R278 Other lack of coordination: Secondary | ICD-10-CM

## 2018-02-24 DIAGNOSIS — R262 Difficulty in walking, not elsewhere classified: Secondary | ICD-10-CM

## 2018-02-24 NOTE — Therapy (Signed)
[place] MAIN [facility] SERVICES [address] [place], [place], [address] Phone: [phone]   Fax:  [phone]  Physical Therapy Treatment  Patient Details  Name: Sharon [name] MRN: [id number] Date of Birth: 1933/01/17 Referring Provider (PT): [place], [place]   Encounter Date: 02/24/2018  PT End of Session - 02/24/18 1314    Visit Number  19    Number of Visits  34    Date for PT Re-Evaluation  03/22/18    Authorization Type  9/10    PT Start Time  0107    PT Stop Time  0145    PT Time Calculation (min)  38 min    Equipment Utilized During Treatment  Gait belt    Activity Tolerance  Patient tolerated treatment well;Patient limited by fatigue       Past Medical History:  Diagnosis Date  . Anemia   . Atrial fibrillation ([place])   . Cardiac arrest ([place])   . Cataract   . CHF (congestive heart failure) ([place])   . Diabetes mellitus without complication ([place])   . Edema    feet/ankles occas  . Hip fracture ([place])   . Hyperlipidemia   . Hypertension   . Hypothyroid   . Neuropathy   . Osteomyelitis ([place])    left first metatarsal  . Stroke [facility])     Past Surgical History:  Procedure Laterality Date  . ACHILLES TENDON SURGERY Left 02/08/2015   Procedure: ACHILLES LENGTHENING/KIDNER;  Surgeon: Albertine Patricia, DPM;  Location: [facility] ORS;  Service: Podiatry;  Laterality: Left;  . APPENDECTOMY    . CARDIAC CATHETERIZATION  08/25/13  . CATARACT EXTRACTION W/PHACO Left 07/20/2014   Procedure: CATARACT EXTRACTION PHACO AND INTRAOCULAR LENS PLACEMENT (IOC);  Surgeon: Leandrew Koyanagi, MD;  Location: [facility] ORS;  Service: Ophthalmology;  Laterality: Left;  [place]  1:18                 AP     23.6             CDE   9.69      lot #[id number]  . CHOLECYSTECTOMY    . CORONARY ANGIOPLASTY    . HALLUX VALGUS AKIN Left 02/08/2015   Procedure: HALLUX VALGUS AKIN/ KELLER;  Surgeon: Albertine Patricia, DPM;  Location: [facility] ORS;  Service: Podiatry;  Laterality:  Left;  IVA with Local needs 1 hour for this case   . HEMIARTHROPLASTY HIP Right   . HEMIARTHROPLASTY HIP Left   . INCISION AND DRAINAGE Left 10/11/2014   Procedure: Removal of infected tibial sessmoid;  Surgeon: Albertine Patricia, DPM;  Location: [facility] ORS;  Service: Podiatry;  Laterality: Left;  . IRRIGATION AND DEBRIDEMENT FOOT Right 03/21/2017   Procedure: IRRIGATION AND DEBRIDEMENT FOOT right great toe amputation;  Surgeon: Sharlotte Alamo, DPM;  Location: [facility] ORS;  Service: Podiatry;  Laterality: Right;  . IRRIGATION AND DEBRIDEMENT FOOT Right 04/18/2017   Procedure: IRRIGATION AND DEBRIDEMENT FOOT and application wound vac;  Surgeon: Albertine Patricia, DPM;  Location: [facility] ORS;  Service: Podiatry;  Laterality: Right;  . PERIPHERAL VASCULAR BALLOON ANGIOPLASTY Right 04/17/2017   Procedure: PERIPHERAL VASCULAR BALLOON ANGIOPLASTY;  Surgeon: Katha Cabal, MD;  Location: [place] CV LAB;  Service: Cardiovascular;  Laterality: Right;    There were no vitals filed for this visit.  Subjective Assessment - 02/24/18 1314    Subjective  Patient reports no falls or LOB since last session. Has been doing HEp daily.     Pertinent History  Patient stopped walking in march. She had a fall at home and she went to the ER and she had infections in bilateral feet. she had her great toe R foot ampuated and she had a wound on L foot. She went to the hospital and then to [facility]. She went back to the MD and had a second surgery in March and then went to another SNF for 3-4 months. She was not able to walk due to being non weight bearing. She was discharged to [facility] an assisted living facility. she had PT 2 x / week. She was able to walk short distances with RW.  She is able to dress herself, she takes herself to the bathroom with assist. She has had several falls.     Limitations  Walking    How long can you stand comfortably?  15 mins if she is holding on    Patient Stated Goals  She wants to walk  again.     Currently in Pain?  No/denies    Pain Score  0-No pain    Pain Onset  1 to 4 weeks ago       Treatment:  Nu-step x 5 mins, tactile cueing to R knee for neutral body mechanics, BUE and BLE  Gait with [id number], 100 x 2 with poor positioning and bilat. Knees flexed with min assist and grip on her belt due to hx of knees buckling  Sit to standto RWfrom mattable, CGA with tactile cueing for alignment of R knee x 5 trials.  Standing hip extension, abd x 10 with poor form and weakness and  min assist  Standing marching x 10 BLE , with cGA  CGA and Min to mod verbal cues used throughout with increased in postural sway and LOB most seen with narrow base of support and while on uneven surfaces. Continues to have balance deficits typical with diagnosis. Patient performs intermediate level exercises without pain behaviors and needs verbal cuing for postural alignment                        PT Education - 02/24/18 1314    Education Details  HEP    Person(s) Educated  Patient    Methods  Explanation    Comprehension  Verbalized understanding;Returned demonstration;Need further instruction       PT Short Term Goals - 12/30/17 1152      PT SHORT TERM GOAL #1   Title  Patient will be independent in home exercise program to improve strength/mobility for better functional independence with ADLs.    Baseline  inconsistent    Time  4    Period  Weeks    Status  Partially Met    Target Date  12/28/17      PT SHORT TERM GOAL #2   Title  Patient will transfer from wc to bed with SBA and RW.    Baseline  needs min assist , able to complete stand pivot transfer with RW SBA    Time  4    Period  Weeks    Status  Achieved    Target Date  12/28/17      PT SHORT TERM GOAL #3   Title  Patient will improve her 143MW test from . 2 m/sec to . 5 m/sec with RW    Baseline  . 2 m/sec, 12/30/17: 0.2 m/s    Time  4    Period  Weeks  Status  Not Met     Target Date  12/28/17        PT Long Term Goals - 01/25/18 1311      PT LONG TERM GOAL #1   Title  Patient will be able to perform 5 sit to stand transfers with RW using her hands to push up from a 17 inch chair    Baseline  performs sit to stand transfer with UE assist from 20 inch wc 5 x in 37 seconds1/13/20=38.58sec from 18 inch chair    Time  8    Period  Weeks    Status  Partially Met    Target Date  03/22/18      PT LONG TERM GOAL #2   Title  Patient will be able to stand x 10 mins with SBA to be able to perform standing activiites at the counter    Baseline  patient is able to stand for 6  min, 01/25/18= 7 15 seconds    Time  8    Period  Weeks    Status  Partially Met    Target Date  03/22/18      PT LONG TERM GOAL #3   Title  Patient will be able to ambulate with RW and SBA  assist x 200 feet     Baseline  Ambulates with min assist and wc following 100 feet 01/25/18= 125 feet with RW and SBA    Time  8    Period  Weeks    Status  Partially Met    Target Date  03/22/18      PT LONG TERM GOAL #4   Title  patient will impvoe BLE hip strength to 4/5 to improve mobility and safety    Baseline  01/25/18=2/5 hip abd and add,, 3/5 hip flex, unable to test hip ext    Time  8    Period  Weeks    Status  Partially Met    Target Date  03/22/18      PT LONG TERM GOAL #5   Title  Patient will improve static standing balance from poor plus to fair and be able to reach for objects on the counter top.    Baseline  poor plus static standing balance, poor plus standing balance 12/18,  01/25/18 poor plus     Time  8    Period  Weeks    Status  Not Met    Target Date  03/22/18            Plan - 02/24/18 1315    Clinical Impression Statement  Patient has flexed knees and hips during standing with Rw and has poor stepping quality during gait. She has weakness in BLE R>L , decreased transfers from wc <>mat, and decreased standing time. She will conitnue to benefit from skilled PT  to improve saftey and mobility.    Rehab Potential  Fair    PT Frequency  2x / week    PT Duration  8 weeks    PT Treatment/Interventions  Aquatic Therapy;Gait training;Therapeutic exercise;Therapeutic activities;Functional mobility training;Balance training;Neuromuscular re-education;Patient/family education;Moist Heat    PT Next Visit Plan  LE strengthening, gait training    PT Home Exercise Plan  bridges, marching, hip abd/ER GTB, SLR    Consulted and Agree with Plan of Care  Patient;Other (Comment)   friend      Patient will benefit from skilled therapeutic intervention in order to improve the following deficits and impairments:  Abnormal  gait, Decreased balance, Decreased endurance, Decreased mobility, Difficulty walking, Impaired UE functional use, Decreased strength, Decreased activity tolerance  Visit Diagnosis: Muscle weakness (generalized)  Other lack of coordination  Difficulty in walking, not elsewhere classified     Problem List Patient Active Problem List   Diagnosis Date Noted  . Atherosclerosis of native arteries of the extremities with ulceration ([place]) 05/07/2017  . Non-healing ulcer ([place]) 04/16/2017  . Diabetic neuropathy ([place]) 04/07/2017  . Septic shock ([place]) 03/20/2017  . CKD (chronic kidney disease), stage III ([place]) 03/19/2017  . Goals of care, counseling/discussion 03/03/2017  . PAD (peripheral artery disease) ([place]) 02/03/2017  . Abdominal aortic stenosis 02/03/2017  . Bilateral lower extremity edema 02/03/2017  . Anemia 01/29/2017  . B12 deficiency 01/29/2017  . Pancreatic mass 01/29/2017  . Hyponatremia 07/07/2015  . Foot ulcer ([place]) 02/07/2015  . ARF (acute renal failure) ([place]) 11/05/2014  . Diabetic foot infection ([place]) 10/10/2014  . Chronic diastolic heart failure ([place]) 05/31/2014  . HTN (hypertension) 05/31/2014  . DM (diabetes mellitus), type 2, uncontrolled ([place]) 05/31/2014    Alanson Puls, [place] DPT 02/24/2018, 1:17 PM  [place] MAIN [facility] SERVICES [address] [place], [place], [address] Phone: [phone]   Fax:  [phone]  Name: LAKRISTA SCADUTO MRN: [id number] Date of Birth: 02/02/1933

## 2018-02-24 NOTE — Therapy (Signed)
[place] MAIN [facility] SERVICES [address] [place], [place], [address] Phone: [phone]   Fax:  [phone]  Occupational Therapy Treatment  Patient Details  Name: Sharon [name] MRN: [id number] Date of Birth: [birth year] Referring Provider (OT): Emily Filbert   Encounter Date: 02/24/2018  OT End of Session - 02/24/18 1701    Visit Number  11    Number of Visits  24    Date for OT Re-Evaluation  03/24/18    Authorization Type  Progress reporting period starting 12/30/2017    OT Start Time  1348    OT Stop Time  1430    OT Time Calculation (min)  42 min    Activity Tolerance  Patient tolerated treatment well    Behavior During Therapy  [facility] for tasks assessed/performed       Past Medical History:  Diagnosis Date  . Anemia   . Atrial fibrillation ([place])   . Cardiac arrest ([place])   . Cataract   . CHF (congestive heart failure) ([place])   . Diabetes mellitus without complication ([place])   . Edema    feet/ankles occas  . Hip fracture ([place])   . Hyperlipidemia   . Hypertension   . Hypothyroid   . Neuropathy   . Osteomyelitis ([place])    left first metatarsal  . Stroke [facility])     Past Surgical History:  Procedure Laterality Date  . ACHILLES TENDON SURGERY Left 02/08/2015   Procedure: ACHILLES LENGTHENING/KIDNER;  Surgeon: Albertine Patricia, DPM;  Location: [facility] ORS;  Service: Podiatry;  Laterality: Left;  . APPENDECTOMY    . CARDIAC CATHETERIZATION  08/25/13  . CATARACT EXTRACTION W/PHACO Left 07/20/2014   Procedure: CATARACT EXTRACTION PHACO AND INTRAOCULAR LENS PLACEMENT (IOC);  Surgeon: Leandrew Koyanagi, MD;  Location: [facility] ORS;  Service: Ophthalmology;  Laterality: Left;  [place]  1:18                 AP     23.6             CDE   9.69      lot #[phone]  . CHOLECYSTECTOMY    . CORONARY ANGIOPLASTY    . HALLUX VALGUS AKIN Left 02/08/2015   Procedure: HALLUX VALGUS AKIN/ KELLER;  Surgeon: Albertine Patricia, DPM;  Location: [facility] ORS;  Service:  Podiatry;  Laterality: Left;  IVA with Local needs 1 hour for this case   . HEMIARTHROPLASTY HIP Right   . HEMIARTHROPLASTY HIP Left   . INCISION AND DRAINAGE Left 10/11/2014   Procedure: Removal of infected tibial sessmoid;  Surgeon: Albertine Patricia, DPM;  Location: [facility] ORS;  Service: Podiatry;  Laterality: Left;  . IRRIGATION AND DEBRIDEMENT FOOT Right 03/21/2017   Procedure: IRRIGATION AND DEBRIDEMENT FOOT right great toe amputation;  Surgeon: Sharlotte Alamo, DPM;  Location: [facility] ORS;  Service: Podiatry;  Laterality: Right;  . IRRIGATION AND DEBRIDEMENT FOOT Right 04/18/2017   Procedure: IRRIGATION AND DEBRIDEMENT FOOT and application wound vac;  Surgeon: Albertine Patricia, DPM;  Location: [facility] ORS;  Service: Podiatry;  Laterality: Right;  . PERIPHERAL VASCULAR BALLOON ANGIOPLASTY Right 04/17/2017   Procedure: PERIPHERAL VASCULAR BALLOON ANGIOPLASTY;  Surgeon: Katha Cabal, MD;  Location: [place] CV LAB;  Service: Cardiovascular;  Laterality: Right;    There were no vitals filed for this visit.  Subjective Assessment - 02/24/18 1700    Subjective   Pt. reports she is doing well today.    Pertinent History  Pt with an extensive history  since March of this year with falls, infection in her feet, great toe of R foot amputated and has been in a skilled nursing facility for many of those months with recent transition to assistive living facility.  She did not ambulate for many months and recently able to walk short distances with an assistive device, she has had multiple falls. She complains of bilateral hand pain but especially on the right side with reports of difficulty holding items, weakness and stiffness which she reports developed suddenly a few weeks ago, she reports CT scan performed but no evidence of stroke. She does report history of arthritic changes in the hands    Patient Stated Goals  Patient reports she would like to be able to get up and walk, use right hand for self care.  Be  able to write again.    Currently in Pain?  No/denies      OT TREATMENT    Neuro muscular re-education:  Pt. Worked on grasping, and sliding coins from an elevated surface on the tabletop using her 2nd digit to her thumb, followed by her 3rd digit to her thumb. Pt. Worked on actively extending her digits between each rep. Pt. Worked on isolating her second digit, and pressing coins into a resistive counter slot. Pt. Attempted with her 3rd digit requiring cues, and assist. Pt. Required the position of the counter at lap level.  Therapeutic Exercise:  Pt. Worked on digit extension exercises with the right hand placed at the tabletop surface. Pt. worked on digit abduction, and adduction. Pt. Worked on pinch strengthening in the right hand for lateral, and 3pt. pinch using yellow, and red resistive clips. Pt. Worked on placing the clips on a vertical dowel positioned at shoulder height.Tactile and verbal cues were required for eliciting the desired movement.                          OT Education - 02/24/18 1701    Education Details  right hand digit extension, [facility] skills    Person(s) Educated  Patient    Methods  Explanation;Demonstration;Verbal cues    Comprehension  Verbalized understanding;Returned demonstration;Verbal cues required          OT Long Term Goals - 02/22/18 1557      OT LONG TERM GOAL #1   Title  Patient will demonstrate increase in grip strength by 5# to be able to hold utensils in right hand for self feeding.     Baseline  Pt. continues to use her left hand to feed self     Time  6    Period  Weeks    Status  On-going    Target Date  03/24/18      OT LONG TERM GOAL #2   Title  Patient will demonstrate improved coordination to pick up small objects in right hand 1/2 inch in size.     Baseline  Pt. is improving with grasping small objects    Time  12    Period  Weeks    Status  On-going    Target Date  03/24/18      OT LONG TERM GOAL #3    Title  Patient to perform UB dressing with modified independence     Baseline  min assist at eval    Time  6    Period  Weeks    Status  On-going    Target Date  03/24/18  OT LONG TERM GOAL #4   Title  Patient will complete bathing with supervision    Baseline  moderate assist at eval     Period  Weeks    Status  On-going    Target Date  03/24/18      OT LONG TERM GOAL #5   Title  Patient will demonstrate HEP independently    Baseline  Pt. requires assist.    Time  12    Period  Weeks    Status  On-going    Target Date  03/24/18      OT LONG TERM GOAL #6   Title  Patient will demonstrate ability to hold pen and write/sign name on important papers with 75% legibility     Baseline  Pt. conitnues to have difficulty holding pen and legiblity less than 25%     Time  12    Period  Weeks    Status  On-going    Target Date  03/24/18            Plan - 02/24/18 1702    Clinical Impression Statement Pt. Presents with right hand 3rd, and 4th digit extension lag. Pt. has limited RUE strength, and [facility] skills which continue to make it difficult for her to manipulate small objects, perform buttoning, and handling utensils. Pt. continues to work on improving strength, and coordination skills in order to be able to improve, and maximize independence with ADL, and IADL functioning.    Occupational Profile and client history currently impacting functional performance  History of falls, currently being seen by PT, toe amputation, balance, arthritic changes in bilateral UEs, lives alone but currently in assistive living    Occupational performance deficits (Please refer to evaluation for details):  ADL's;IADL's;Social Participation    Rehab Potential  Good    Current Impairments/barriers affecting progress:  significant weakness in R hand compared to left, balance, fall risk    OT Frequency  2x / week    OT Duration  12 weeks    OT Treatment/Interventions  Self-care/ADL  training;Therapeutic exercise;Moist Heat;Neuromuscular education;Splinting;Patient/family education;Therapeutic activities;Balance training;Contrast Bath;DME and/or AE instruction;Manual Therapy    Clinical Decision Making  Several treatment options, min-mod task modification necessary    Consulted and Agree with Plan of Care  Patient       Patient will benefit from skilled therapeutic intervention in order to improve the following deficits and impairments:  Abnormal gait, Decreased knowledge of use of DME, Impaired flexibility, Pain, Decreased coordination, Decreased mobility, Decreased activity tolerance, Decreased endurance, Decreased range of motion, Decreased strength, Decreased balance, Difficulty walking, Impaired UE functional use  Visit Diagnosis: Muscle weakness (generalized)  Other lack of coordination    Problem List Patient Active Problem List   Diagnosis Date Noted  . Atherosclerosis of native arteries of the extremities with ulceration ([place]) 05/07/2017  . Non-healing ulcer ([place]) 04/16/2017  . Diabetic neuropathy ([place]) 04/07/2017  . Septic shock ([place]) 03/20/2017  . CKD (chronic kidney disease), stage III ([place]) 03/19/2017  . Goals of care, counseling/discussion 03/03/2017  . PAD (peripheral artery disease) ([place]) 02/03/2017  . Abdominal aortic stenosis 02/03/2017  . Bilateral lower extremity edema 02/03/2017  . Anemia 01/29/2017  . B12 deficiency 01/29/2017  . Pancreatic mass 01/29/2017  . Hyponatremia 07/07/2015  . Foot ulcer ([place]) 02/07/2015  . ARF (acute renal failure) ([place]) 11/05/2014  . Diabetic foot infection ([place]) 10/10/2014  . Chronic diastolic heart failure ([place]) 05/31/2014  . HTN (hypertension) 05/31/2014  . DM (diabetes mellitus),  type 2, uncontrolled ([place]) 05/31/2014    Harrel Carina, MS, OTR/L 02/24/2018, 5:10 PM  [place] MAIN [facility] SERVICES [address] [place], [place], [address] Phone: [phone]    Fax:  [phone]  Name: SHEENAH DIMITROFF MRN: [id number] Date of Birth: [birth year]

## 2018-03-01 ENCOUNTER — Ambulatory Visit: Payer: PPO | Admitting: Occupational Therapy

## 2018-03-01 ENCOUNTER — Encounter: Payer: Self-pay | Admitting: Occupational Therapy

## 2018-03-01 ENCOUNTER — Encounter: Payer: Self-pay | Admitting: Physical Therapy

## 2018-03-01 ENCOUNTER — Ambulatory Visit: Payer: PPO | Admitting: Physical Therapy

## 2018-03-01 DIAGNOSIS — R278 Other lack of coordination: Secondary | ICD-10-CM

## 2018-03-01 DIAGNOSIS — R262 Difficulty in walking, not elsewhere classified: Secondary | ICD-10-CM

## 2018-03-01 DIAGNOSIS — M6281 Muscle weakness (generalized): Secondary | ICD-10-CM

## 2018-03-01 NOTE — Therapy (Signed)
[place] MAIN [facility] SERVICES [address] [place], [place], [address] Phone: [phone]   Fax:  [phone]  Physical Therapy Treatment/ Physical Therapy Progress Note   Dates of reporting period  01/25/18   to   12/30/18  Patient Details  Name: Sharon [name] MRN: [id number] Date of Birth: 04-22-1933 Referring Provider (PT): [place], [place]   Encounter Date: 03/01/2018  PT End of Session - 03/01/18 1322    Visit Number  20    Number of Visits  34    Date for PT Re-Evaluation  03/22/18    Authorization Type  10/10    PT Start Time  0110    PT Stop Time  0148    PT Time Calculation (min)  38 min    Equipment Utilized During Treatment  Gait belt    Activity Tolerance  Patient tolerated treatment well;Patient limited by fatigue       Past Medical History:  Diagnosis Date  . Anemia   . Atrial fibrillation ([place])   . Cardiac arrest ([place])   . Cataract   . CHF (congestive heart failure) ([place])   . Diabetes mellitus without complication ([place])   . Edema    feet/ankles occas  . Hip fracture ([place])   . Hyperlipidemia   . Hypertension   . Hypothyroid   . Neuropathy   . Osteomyelitis ([place])    left first metatarsal  . Stroke [facility])     Past Surgical History:  Procedure Laterality Date  . ACHILLES TENDON SURGERY Left 02/08/2015   Procedure: ACHILLES LENGTHENING/KIDNER;  Surgeon: Albertine Patricia, DPM;  Location: [facility] ORS;  Service: Podiatry;  Laterality: Left;  . APPENDECTOMY    . CARDIAC CATHETERIZATION  08/25/13  . CATARACT EXTRACTION W/PHACO Left 07/20/2014   Procedure: CATARACT EXTRACTION PHACO AND INTRAOCULAR LENS PLACEMENT (IOC);  Surgeon: Leandrew Koyanagi, MD;  Location: [facility] ORS;  Service: Ophthalmology;  Laterality: Left;  [place]  1:18                 AP     23.6             CDE   9.69      lot #[id number]  . CHOLECYSTECTOMY    . CORONARY ANGIOPLASTY    . HALLUX VALGUS AKIN Left 02/08/2015   Procedure: HALLUX VALGUS AKIN/ KELLER;   Surgeon: Albertine Patricia, DPM;  Location: [facility] ORS;  Service: Podiatry;  Laterality: Left;  IVA with Local needs 1 hour for this case   . HEMIARTHROPLASTY HIP Right   . HEMIARTHROPLASTY HIP Left   . INCISION AND DRAINAGE Left 10/11/2014   Procedure: Removal of infected tibial sessmoid;  Surgeon: Albertine Patricia, DPM;  Location: [facility] ORS;  Service: Podiatry;  Laterality: Left;  . IRRIGATION AND DEBRIDEMENT FOOT Right 03/21/2017   Procedure: IRRIGATION AND DEBRIDEMENT FOOT right great toe amputation;  Surgeon: Sharlotte Alamo, DPM;  Location: [facility] ORS;  Service: Podiatry;  Laterality: Right;  . IRRIGATION AND DEBRIDEMENT FOOT Right 04/18/2017   Procedure: IRRIGATION AND DEBRIDEMENT FOOT and application wound vac;  Surgeon: Albertine Patricia, DPM;  Location: [facility] ORS;  Service: Podiatry;  Laterality: Right;  . PERIPHERAL VASCULAR BALLOON ANGIOPLASTY Right 04/17/2017   Procedure: PERIPHERAL VASCULAR BALLOON ANGIOPLASTY;  Surgeon: Katha Cabal, MD;  Location: [place] CV LAB;  Service: Cardiovascular;  Laterality: Right;    There were no vitals filed for this visit.  Subjective Assessment - 03/01/18 1322    Subjective  Patient reports no  falls or LOB since last session. Has been doing HEp daily.     Pertinent History  Patient stopped walking in march. She had a fall at home and she went to the ER and she had infections in bilateral feet. she had her great toe R foot ampuated and she had a wound on L foot. She went to the hospital and then to [facility]. She went back to the [facility] and had a second surgery in March and then went to another SNF for 3-4 months. She was not able to walk due to being non weight bearing. She was discharged to [facility] an assisted living facility. she had PT 2 x / week. She was able to walk short distances with RW.  She is able to dress herself, she takes herself to the bathroom with assist. She has had several falls.     Limitations  Walking    How long can you stand  comfortably?  15 mins if she is holding on    Patient Stated Goals  She wants to walk again.     Currently in Pain?  No/denies    Pain Score  0-No pain    Pain Onset  1 to 4 weeks ago        Therapeutic activities; Outcome measures were performed and goals were reviewed: 5 x sit to stand 10 MW . 30 m/sec Strength testing BLE Standing tolerance  Assessment of static and dynamic standing balance  Patient is making progress towards strengthening BLE, standing goa, balance goal and functional tasks such as sit to stand goal   STRENGTH:  Graded on a 0-5 scale Muscle Group Left Right  Shoulder flex 3/5 3/5  Shoulder Abd 3/5 3/5  Shoulder Ext 3/5 3/5  Shoulder IR/ER NT NT  Elbow 3/5 3/5  Wrist/hand NT NT  Hip Flex 3+/5 3+/5  Hip Abd 2+/5 2+/5  Hip Add 2/5 2/5  Hip Ext NT NT  Hip IR/ER 3/5 3/5  Knee Flex 5/5 5/5  Knee Ext 5/5 5/5  Ankle DF 3/5 3/5  Ankle PF NT NT   SENSATION: numbness in B feet   FUNCTIONAL MOBILITY: Patient needs SBA for transfers wc to mat Patient is not able to perform supine to prone  Bed mobiity Supine to sidelying mobility is slow  Supine to sit is slow and independent   BALANCE:     Static Standing Balance  Normal Able to maintain standing balance against maximal resistance   Good Able to maintain standing balance against moderate resistance   Good-/Fair+ Able to maintain standing balance against minimal resistance   Fair Able to stand unsupported without UE support and without LOB for 1-2 min   Fair- Requires Min A and UE support to maintain standing without loss of balance x  Poor+ Requires mod A and UE support to maintain standing without loss of balance   Poor Requires max A and UE support to maintain standing balance without loss        Standing Dynamic Balance  Normal Stand independently unsupported, able to weight shift and cross midline maximally   Good Stand independently unsupported, able to weight shift and cross  midline moderately   Good-/Fair+ Stand independently unsupported, able to weight shift across midline minimally   Fair Stand independently unsupported, weight shift, and reach ipsilaterally, loss of balance when crossing midline   Poor+ Able to stand with Min A and reach ipsilaterally, unable to weight shift x  Poor Able to stand with  Mod A and minimally reach ipsilaterally, unable to cross midline.      GAIT: Patient ambulates with RW 100 feet with min assist and wc following with unsteady gait  OUTCOME MEASURES: TEST Outcome Interpretation  5 times sit<>stand 20 inch wc with cushion  .43 sec, no assist >54 yo, >15 sec indicates increased risk for falls  10 meter walk test .30                 m/s <1.0 m/s indicates increased risk for falls; limited community ambulator                                         PT Education - 03/01/18 1322    Education Details  HEP    Person(s) Educated  Patient    Methods  Explanation    Comprehension  Verbalized understanding;Returned demonstration;Need further instruction       PT Short Term Goals - 12/30/17 1152      PT SHORT TERM GOAL #1   Title  Patient will be independent in home exercise program to improve strength/mobility for better functional independence with ADLs.    Baseline  inconsistent    Time  4    Period  Weeks    Status  Partially Met    Target Date  12/28/17      PT SHORT TERM GOAL #2   Title  Patient will transfer from wc to bed with SBA and RW.    Baseline  needs min assist , able to complete stand pivot transfer with RW SBA    Time  4    Period  Weeks    Status  Achieved    Target Date  12/28/17      PT SHORT TERM GOAL #3   Title  Patient will improve her 2 MW test from . 2 m/sec to . 5 m/sec with RW    Baseline  . 2 m/sec, 12/30/17: 0.2 m/s    Time  4    Period  Weeks    Status  Not Met    Target Date  12/28/17        PT Long Term Goals - 03/01/18 1325      PT  LONG TERM GOAL #1   Title  Patient will be able to perform 5 sit to stand transfers with RW using her hands to push up from a 17 inch chair    Baseline  performs sit to stand transfer with UE assist from 20 inch wc 5 x in 37 seconds1/13/20=38.58sec from 18 inch chair   03/01/18=43.23 sec and no assist    Time  8    Period  Weeks    Status  Partially Met    Target Date  03/22/18      PT LONG TERM GOAL #2   Title  Patient will be able to stand x 10 mins with SBA to be able to perform standing activiites at the counter    Baseline  patient is able to stand for 6  min, 01/25/18= 7 15 seconds: 03/01/18 = 6 mins     Time  8    Period  Weeks    Status  Partially Met    Target Date  03/22/18      PT LONG TERM GOAL #3   Title  Patient will be able to ambulate with RW and SBA  assist x 200 feet     Baseline  Ambulates with min assist and wc following 100 feet 01/25/18= 125 feet with RW and SBA: 03/01/18 ambulates 145 feet with RW and SBA    Time  8    Period  Weeks    Status  Partially Met    Target Date  03/22/18      PT LONG TERM GOAL #4   Title  patient will impvoe BLE hip strength to 4/5 to improve mobility and safety    Baseline  01/25/18=2/5 hip abd and add,, 3/5 hip flex, unable to test hip ext:  03/01/18= B hip abd 2/5, B hip flex 3/5,     Time  8    Period  Weeks    Status  Partially Met    Target Date  03/22/18      PT LONG TERM GOAL #5   Title  Patient will improve static standing balance from poor plus to fair and be able to reach for objects on the counter top.    Baseline  poor plus static standing balance, poor plus standing balance 12/18,  01/25/18 poor plus , 03/01/18= unable to stand without RW for support or reah fwd    Time  8    Period  Weeks    Status  Not Met    Target Date  03/22/18            Plan - 03/01/18 1502    Clinical Impression Statement Patient's condition has the potential to improve in response to therapy. Maximum improvement is yet to be obtained. The  anticipated improvement is attainable and reasonable in a generally predictable time.  Patient reports being able to stand longer and transfer to the toilet at her assisted living.  Patient demonstrates improved stability and strength allowing patient to perform short duration standing interventions with rest periods.  Patient performs intermediate standing exercises to shift weight and perform bilateral leg standing activities with min assist. Patient fatigues quickly with exercises requiring rest breaks at this time. Patient will continue to benefit from skilled physical therapy to improve pain and mobility.    Rehab Potential  Fair    PT Frequency  2x / week    PT Duration  8 weeks    PT Treatment/Interventions  Aquatic Therapy;Gait training;Therapeutic exercise;Therapeutic activities;Functional mobility training;Balance training;Neuromuscular re-education;Patient/family education;Moist Heat    PT Next Visit Plan  LE strengthening, gait training    PT Home Exercise Plan  bridges, marching, hip abd/ER GTB, SLR    Consulted and Agree with Plan of Care  Patient;Other (Comment)   friend      Patient will benefit from skilled therapeutic intervention in order to improve the following deficits and impairments:  Abnormal gait, Decreased balance, Decreased endurance, Decreased mobility, Difficulty walking, Impaired UE functional use, Decreased strength, Decreased activity tolerance  Visit Diagnosis: Muscle weakness (generalized)  Other lack of coordination  Difficulty in walking, not elsewhere classified     Problem List Patient Active Problem List   Diagnosis Date Noted  . Atherosclerosis of native arteries of the extremities with ulceration ([place]) 05/07/2017  . Non-healing ulcer ([place]) 04/16/2017  . Diabetic neuropathy ([place]) 04/07/2017  . Septic shock ([place]) 03/20/2017  . CKD (chronic kidney disease), stage III ([place]) 03/19/2017  . Goals of care, counseling/discussion 03/03/2017  . PAD  (peripheral artery disease) ([place]) 02/03/2017  . Abdominal aortic stenosis 02/03/2017  . Bilateral lower extremity edema 02/03/2017  . Anemia 01/29/2017  . B12 deficiency  01/29/2017  . Pancreatic mass 01/29/2017  . Hyponatremia 07/07/2015  . Foot ulcer ([place]) 02/07/2015  . ARF (acute renal failure) ([place]) 11/05/2014  . Diabetic foot infection ([place]) 10/10/2014  . Chronic diastolic heart failure ([place]) 05/31/2014  . HTN (hypertension) 05/31/2014  . DM (diabetes mellitus), type 2, uncontrolled ([place]) 05/31/2014    Alanson Puls, [place] DPT 03/01/2018, 3:05 PM  [place] MAIN [facility] SERVICES [address] [place], [place], [address] Phone: [phone]   Fax:  [phone]  Name: REHMAT MURTAGH MRN: [id number] Date of Birth: [birth year]

## 2018-03-01 NOTE — Therapy (Signed)
[place] MAIN [facility] SERVICES [address] [place], [place], [address] Phone: [phone]   Fax:  [phone]  Occupational Therapy Treatment  Patient Details  Name: Sharon [name] MRN: [id number] Date of Birth: [birth year] Referring Provider (OT): Emily Filbert   Encounter Date: 03/01/2018  OT End of Session - 03/01/18 1414    Visit Number  12    Number of Visits  24    Date for OT Re-Evaluation  03/24/18    Authorization Type  Progress reporting period starting 12/30/2017    OT Start Time  1350    OT Stop Time  1430    OT Time Calculation (min)  40 min    Activity Tolerance  Patient tolerated treatment well    Behavior During Therapy  [facility] for tasks assessed/performed       Past Medical History:  Diagnosis Date  . Anemia   . Atrial fibrillation ([place])   . Cardiac arrest ([place])   . Cataract   . CHF (congestive heart failure) ([place])   . Diabetes mellitus without complication ([place])   . Edema    feet/ankles occas  . Hip fracture ([place])   . Hyperlipidemia   . Hypertension   . Hypothyroid   . Neuropathy   . Osteomyelitis ([place])    left first metatarsal  . Stroke [facility])     Past Surgical History:  Procedure Laterality Date  . ACHILLES TENDON SURGERY Left 02/08/2015   Procedure: ACHILLES LENGTHENING/KIDNER;  Surgeon: Albertine Patricia, DPM;  Location: [facility] ORS;  Service: Podiatry;  Laterality: Left;  . APPENDECTOMY    . CARDIAC CATHETERIZATION  08/25/13  . CATARACT EXTRACTION W/PHACO Left 07/20/2014   Procedure: CATARACT EXTRACTION PHACO AND INTRAOCULAR LENS PLACEMENT (IOC);  Surgeon: Leandrew Koyanagi, MD;  Location: [facility] ORS;  Service: Ophthalmology;  Laterality: Left;  [place]  1:18                 AP     23.6             CDE   9.69      lot #[id number]  . CHOLECYSTECTOMY    . CORONARY ANGIOPLASTY    . HALLUX VALGUS AKIN Left 02/08/2015   Procedure: HALLUX VALGUS AKIN/ KELLER;  Surgeon: Albertine Patricia, DPM;  Location: [facility] ORS;  Service:  Podiatry;  Laterality: Left;  IVA with Local needs 1 hour for this case   . HEMIARTHROPLASTY HIP Right   . HEMIARTHROPLASTY HIP Left   . INCISION AND DRAINAGE Left 10/11/2014   Procedure: Removal of infected tibial sessmoid;  Surgeon: Albertine Patricia, DPM;  Location: [facility] ORS;  Service: Podiatry;  Laterality: Left;  . IRRIGATION AND DEBRIDEMENT FOOT Right 03/21/2017   Procedure: IRRIGATION AND DEBRIDEMENT FOOT right great toe amputation;  Surgeon: Sharlotte Alamo, DPM;  Location: [facility] ORS;  Service: Podiatry;  Laterality: Right;  . IRRIGATION AND DEBRIDEMENT FOOT Right 04/18/2017   Procedure: IRRIGATION AND DEBRIDEMENT FOOT and application wound vac;  Surgeon: Albertine Patricia, DPM;  Location: [facility] ORS;  Service: Podiatry;  Laterality: Right;  . PERIPHERAL VASCULAR BALLOON ANGIOPLASTY Right 04/17/2017   Procedure: PERIPHERAL VASCULAR BALLOON ANGIOPLASTY;  Surgeon: Katha Cabal, MD;  Location: [place] CV LAB;  Service: Cardiovascular;  Laterality: Right;    There were no vitals filed for this visit.  Subjective Assessment - 03/01/18 1413    Subjective   Pt. reports that her hands are cold today.    Pertinent History  Pt with an extensive  history since March of this year with falls, infection in her feet, great toe of R foot amputated and has been in a skilled nursing facility for many of those months with recent transition to assistive living facility.  She did not ambulate for many months and recently able to walk short distances with an assistive device, she has had multiple falls. She complains of bilateral hand pain but especially on the right side with reports of difficulty holding items, weakness and stiffness which she reports developed suddenly a few weeks ago, she reports CT scan performed but no evidence of stroke. She does report history of arthritic changes in the hands    Currently in Pain?  No/denies      OT TREATMENT    Neuro muscular re-education:  Pt. Worked on grasping,  and sliding coins with her right hand from an elevated surface on the tabletop using her 2nd digit to her thumb, followed by her 3rd digit to her thumb. Pt. Worked on actively extending her digits between each rep. Pt. Worked on isolating her second digit, and pressing coins into a resistive counter slot. Pt. Worked on [facility] skills grasping 1/2" washers from a magnetic resistive dish, and placed them on a horizontal dowel stabilized with her left hand.  Therapeutic Exercise:  Pt. Worked on digit extension exercises with the right hand placed at the tabletop surface. Pt. worked on digit abduction, and adduction. Pt. Worked on pinch strengthening in the right hand for lateral, and 3pt. pinch using yellow resistive clips. Pt. Worked on placing the clips on a vertical dowel positioned at shoulder height.Tactile and verbal cues were required for eliciting the desired movement.                         OT Education - 03/01/18 1414    Education Details  right hand digit extension, [place] skills    Person(s) Educated  Patient    Methods  Explanation;Demonstration;Verbal cues    Comprehension  Verbalized understanding;Returned demonstration;Verbal cues required          OT Long Term Goals - 02/22/18 1557      OT LONG TERM GOAL #1   Title  Patient will demonstrate increase in grip strength by 5# to be able to hold utensils in right hand for self feeding.     Baseline  Pt. continues to use her left hand to feed self     Time  6    Period  Weeks    Status  On-going    Target Date  03/24/18      OT LONG TERM GOAL #2   Title  Patient will demonstrate improved coordination to pick up small objects in right hand 1/2 inch in size.     Baseline  Pt. is improving with grasping small objects    Time  12    Period  Weeks    Status  On-going    Target Date  03/24/18      OT LONG TERM GOAL #3   Title  Patient to perform UB dressing with modified independence     Baseline  min  assist at eval    Time  6    Period  Weeks    Status  On-going    Target Date  03/24/18      OT LONG TERM GOAL #4   Title  Patient will complete bathing with supervision    Baseline  moderate assist at eval  Period  Weeks    Status  On-going    Target Date  03/24/18      OT LONG TERM GOAL #5   Title  Patient will demonstrate HEP independently    Baseline  Pt. requires assist.    Time  12    Period  Weeks    Status  On-going    Target Date  03/24/18      OT LONG TERM GOAL #6   Title  Patient will demonstrate ability to hold pen and write/sign name on important papers with 75% legibility     Baseline  Pt. conitnues to have difficulty holding pen and legiblity less than 25%     Time  12    Period  Weeks    Status  On-going    Target Date  03/24/18            Plan - 03/01/18 1415    Clinical Impression Statement  Pt. continues to work on improving right hand digit extension in the MPs, PIPs, and DIPs secondary to 3rd, and 4th digit extion lag.  pt. continues to work on improving RUE strength, hand function, and [facility] skills in order to be able to maximize independence during ADLs, and IADLs.    Occupational Profile and client history currently impacting functional performance  History of falls, currently being seen by PT, toe amputation, balance, arthritic changes in bilateral UEs, lives alone but currently in assistive living    Occupational performance deficits (Please refer to evaluation for details):  ADL's;IADL's;Social Participation    Rehab Potential  Good    Current Impairments/barriers affecting progress:  significant weakness in R hand compared to left, balance, fall risk    OT Frequency  2x / week    OT Duration  12 weeks    OT Treatment/Interventions  Self-care/ADL training;Therapeutic exercise;Moist Heat;Neuromuscular education;Splinting;Patient/family education;Therapeutic activities;Balance training;Contrast Bath;DME and/or AE instruction;Manual Therapy     Clinical Decision Making  Several treatment options, min-mod task modification necessary    Consulted and Agree with Plan of Care  Patient       Patient will benefit from skilled therapeutic intervention in order to improve the following deficits and impairments:  Abnormal gait, Decreased knowledge of use of DME, Impaired flexibility, Pain, Decreased coordination, Decreased mobility, Decreased activity tolerance, Decreased endurance, Decreased range of motion, Decreased strength, Decreased balance, Difficulty walking, Impaired UE functional use  Visit Diagnosis: Muscle weakness (generalized)  Other lack of coordination    Problem List Patient Active Problem List   Diagnosis Date Noted  . Atherosclerosis of native arteries of the extremities with ulceration ([place]) 05/07/2017  . Non-healing ulcer ([place]) 04/16/2017  . Diabetic neuropathy ([place]) 04/07/2017  . Septic shock ([place]) 03/20/2017  . CKD (chronic kidney disease), stage III ([place]) 03/19/2017  . Goals of care, counseling/discussion 03/03/2017  . PAD (peripheral artery disease) ([place]) 02/03/2017  . Abdominal aortic stenosis 02/03/2017  . Bilateral lower extremity edema 02/03/2017  . Anemia 01/29/2017  . B12 deficiency 01/29/2017  . Pancreatic mass 01/29/2017  . Hyponatremia 07/07/2015  . Foot ulcer ([place]) 02/07/2015  . ARF (acute renal failure) ([place]) 11/05/2014  . Diabetic foot infection ([place]) 10/10/2014  . Chronic diastolic heart failure ([place]) 05/31/2014  . HTN (hypertension) 05/31/2014  . DM (diabetes mellitus), type 2, uncontrolled ([place]) 05/31/2014    Harrel Carina, MS, OTR/L 03/01/2018, 6:40 PM  [place] MAIN [facility] SERVICES [address] [place], [place], [address] Phone: [phone]   Fax:  [phone]  Name: Sharon [name] MRN: [id number] Date of Birth: 11/27/1933

## 2018-03-03 ENCOUNTER — Encounter: Payer: Self-pay | Admitting: Physical Therapy

## 2018-03-03 ENCOUNTER — Ambulatory Visit: Payer: PPO | Admitting: Occupational Therapy

## 2018-03-03 ENCOUNTER — Encounter: Payer: Self-pay | Admitting: Occupational Therapy

## 2018-03-03 ENCOUNTER — Ambulatory Visit: Payer: PPO | Admitting: Physical Therapy

## 2018-03-03 DIAGNOSIS — R278 Other lack of coordination: Secondary | ICD-10-CM

## 2018-03-03 DIAGNOSIS — R600 Localized edema: Secondary | ICD-10-CM | POA: Diagnosis not present

## 2018-03-03 DIAGNOSIS — M6281 Muscle weakness (generalized): Secondary | ICD-10-CM

## 2018-03-03 DIAGNOSIS — R262 Difficulty in walking, not elsewhere classified: Secondary | ICD-10-CM

## 2018-03-03 DIAGNOSIS — W010XXA Fall on same level from slipping, tripping and stumbling without subsequent striking against object, initial encounter: Secondary | ICD-10-CM | POA: Diagnosis not present

## 2018-03-03 DIAGNOSIS — N184 Chronic kidney disease, stage 4 (severe): Secondary | ICD-10-CM | POA: Diagnosis not present

## 2018-03-03 DIAGNOSIS — I5033 Acute on chronic diastolic (congestive) heart failure: Secondary | ICD-10-CM | POA: Diagnosis not present

## 2018-03-03 NOTE — Therapy (Signed)
[place] MAIN [facility] SERVICES [address] [place], [place], [address] Phone: [phone]   Fax:  [phone]  Occupational Therapy Treatment  Patient Details  Name: Sharon [name] MRN: [id number] Date of Birth: [birth year] Referring Provider (OT): Emily Filbert   Encounter Date: 03/03/2018  OT End of Session - 03/03/18 1639    Visit Number  13    Number of Visits  24    Date for OT Re-Evaluation  03/24/18    Authorization Type  Progress reporting period starting 12/30/2017    OT Start Time  1345    OT Stop Time  1430    OT Time Calculation (min)  45 min    Activity Tolerance  Patient tolerated treatment well    Behavior During Therapy  [facility] for tasks assessed/performed       Past Medical History:  Diagnosis Date  . Anemia   . Atrial fibrillation ([place])   . Cardiac arrest ([place])   . Cataract   . CHF (congestive heart failure) ([place])   . Diabetes mellitus without complication ([place])   . Edema    feet/ankles occas  . Hip fracture ([place])   . Hyperlipidemia   . Hypertension   . Hypothyroid   . Neuropathy   . Osteomyelitis ([place])    left first metatarsal  . Stroke [facility])     Past Surgical History:  Procedure Laterality Date  . ACHILLES TENDON SURGERY Left 02/08/2015   Procedure: ACHILLES LENGTHENING/KIDNER;  Surgeon: Albertine Patricia, DPM;  Location: [facility] ORS;  Service: Podiatry;  Laterality: Left;  . APPENDECTOMY    . CARDIAC CATHETERIZATION  08/25/13  . CATARACT EXTRACTION W/PHACO Left 07/20/2014   Procedure: CATARACT EXTRACTION PHACO AND INTRAOCULAR LENS PLACEMENT (IOC);  Surgeon: Leandrew Koyanagi, MD;  Location: [facility] ORS;  Service: Ophthalmology;  Laterality: Left;  [place]  1:18                 AP     23.6             CDE   9.69      lot #[phone]  . CHOLECYSTECTOMY    . CORONARY ANGIOPLASTY    . HALLUX VALGUS AKIN Left 02/08/2015   Procedure: HALLUX VALGUS AKIN/ KELLER;  Surgeon: Albertine Patricia, DPM;  Location: [facility] ORS;  Service:  Podiatry;  Laterality: Left;  IVA with Local needs 1 hour for this case   . HEMIARTHROPLASTY HIP Right   . HEMIARTHROPLASTY HIP Left   . INCISION AND DRAINAGE Left 10/11/2014   Procedure: Removal of infected tibial sessmoid;  Surgeon: Albertine Patricia, DPM;  Location: [facility] ORS;  Service: Podiatry;  Laterality: Left;  . IRRIGATION AND DEBRIDEMENT FOOT Right 03/21/2017   Procedure: IRRIGATION AND DEBRIDEMENT FOOT right great toe amputation;  Surgeon: Sharlotte Alamo, DPM;  Location: [facility] ORS;  Service: Podiatry;  Laterality: Right;  . IRRIGATION AND DEBRIDEMENT FOOT Right 04/18/2017   Procedure: IRRIGATION AND DEBRIDEMENT FOOT and application wound vac;  Surgeon: Albertine Patricia, DPM;  Location: [facility] ORS;  Service: Podiatry;  Laterality: Right;  . PERIPHERAL VASCULAR BALLOON ANGIOPLASTY Right 04/17/2017   Procedure: PERIPHERAL VASCULAR BALLOON ANGIOPLASTY;  Surgeon: Katha Cabal, MD;  Location: [place] CV LAB;  Service: Cardiovascular;  Laterality: Right;    There were no vitals filed for this visit.  Subjective Assessment - 03/03/18 1638    Subjective   Pt. reports having an MD appointment after this session.    Pertinent History  Pt with an  extensive history since March of this year with falls, infection in her feet, great toe of R foot amputated and has been in a skilled nursing facility for many of those months with recent transition to assistive living facility.  She did not ambulate for many months and recently able to walk short distances with an assistive device, she has had multiple falls. She complains of bilateral hand pain but especially on the right side with reports of difficulty holding items, weakness and stiffness which she reports developed suddenly a few weeks ago, she reports CT scan performed but no evidence of stroke. She does report history of arthritic changes in the hands    Patient Stated Goals  Patient reports she would like to be able to get up and walk, use right hand  for self care.  Be able to write again.    Currently in Pain?  No/denies      OT TREATMENT    Neuro muscular re-education:  Pt. worked on grasping, and sliding coins with her right hand from an elevated surface on the tabletop using her 2nd digit to her thumb, followed by her 3rd digit to her thumb. Pt. Worked on actively extending her digits between each rep. Pt. worked on isolating her second digit, and pressing coins into a resistive counter slot. Pt. worked on [facility] skills grasping 1/2" washers from a magnetic resistive dish, and placed them on a horizontal dowel stabilized with her left hand.  Therapeutic Exercise:  Pt. worked on [employer] with 2# dumbbells for elbow flexion, extension, 1# for forearm supination, pronation, and wrist flexion, extension. Right UE MP/PIP/DIP AROM   2nd Digit MP:  30-60 PIP: 15-90  DIP:45-55 3rd Digit MP: 30-75   PIP:  45-105 DIP: 35-60 4th Digit MP: 30-75   PIP: 45-105 DIP: 25-60 5th Digit MP: 10-75   PIP: 35-95 DIP: 10-55                            OT Education - 03/03/18 1639    Education Details  right hand digit extension, [place] skills    Person(s) Educated  Patient    Methods  Explanation;Demonstration;Verbal cues    Comprehension  Verbalized understanding;Returned demonstration;Verbal cues required          OT Long Term Goals - 02/22/18 1557      OT LONG TERM GOAL #1   Title  Patient will demonstrate increase in grip strength by 5# to be able to hold utensils in right hand for self feeding.     Baseline  Pt. continues to use her left hand to feed self     Time  6    Period  Weeks    Status  On-going    Target Date  03/24/18      OT LONG TERM GOAL #2   Title  Patient will demonstrate improved coordination to pick up small objects in right hand 1/2 inch in size.     Baseline  Pt. is improving with grasping small objects    Time  12    Period  Weeks    Status  On-going    Target Date  03/24/18       OT LONG TERM GOAL #3   Title  Patient to perform UB dressing with modified independence     Baseline  min assist at eval    Time  6    Period  Weeks  Status  On-going    Target Date  03/24/18      OT LONG TERM GOAL #4   Title  Patient will complete bathing with supervision    Baseline  moderate assist at eval     Period  Weeks    Status  On-going    Target Date  03/24/18      OT LONG TERM GOAL #5   Title  Patient will demonstrate HEP independently    Baseline  Pt. requires assist.    Time  12    Period  Weeks    Status  On-going    Target Date  03/24/18      OT LONG TERM GOAL #6   Title  Patient will demonstrate ability to hold pen and write/sign name on important papers with 75% legibility     Baseline  Pt. conitnues to have difficulty holding pen and legiblity less than 25%     Time  12    Period  Weeks    Status  On-going    Target Date  03/24/18            Plan - 03/03/18 1639    Clinical Impression Statement  Pt. had a fall this morning at [facility]. Pt. reports that they never came when she rang the bell to use the bathroom, so she got up, and tried to get there herself instead of being soiled. Pt. reports falling when trying to stand to get to her w/c. Pt. reports bumping her left elbow. Pt. continues to work on imrpoving UE strength, right hand digit extension, and [place] skills in order to improve engagement, and safety with ADL, and IADL functioninh    Occupational Profile and client history currently impacting functional performance  History of falls, currently being seen by PT, toe amputation, balance, arthritic changes in bilateral UEs, lives alone but currently in assistive living    Occupational performance deficits (Please refer to evaluation for details):  ADL's;IADL's;Social Participation    Rehab Potential  Good    Current Impairments/barriers affecting progress:  significant weakness in R hand compared to left, balance, fall risk    OT Frequency  2x /  week    OT Duration  12 weeks    OT Treatment/Interventions  Self-care/ADL training;Therapeutic exercise;Moist Heat;Neuromuscular education;Splinting;Patient/family education;Therapeutic activities;Balance training;Contrast Bath;DME and/or AE instruction;Manual Therapy    Clinical Decision Making  Several treatment options, min-mod task modification necessary    Consulted and Agree with Plan of Care  Patient       Patient will benefit from skilled therapeutic intervention in order to improve the following deficits and impairments:     Visit Diagnosis: Muscle weakness (generalized)    Problem List Patient Active Problem List   Diagnosis Date Noted  . Atherosclerosis of native arteries of the extremities with ulceration ([place]) 05/07/2017  . Non-healing ulcer ([place]) 04/16/2017  . Diabetic neuropathy ([place]) 04/07/2017  . Septic shock ([place]) 03/20/2017  . CKD (chronic kidney disease), stage III ([place]) 03/19/2017  . Goals of care, counseling/discussion 03/03/2017  . PAD (peripheral artery disease) ([place]) 02/03/2017  . Abdominal aortic stenosis 02/03/2017  . Bilateral lower extremity edema 02/03/2017  . Anemia 01/29/2017  . B12 deficiency 01/29/2017  . Pancreatic mass 01/29/2017  . Hyponatremia 07/07/2015  . Foot ulcer ([place]) 02/07/2015  . ARF (acute renal failure) ([place]) 11/05/2014  . Diabetic foot infection ([place]) 10/10/2014  . Chronic diastolic heart failure ([place]) 05/31/2014  . HTN (hypertension) 05/31/2014  . DM (diabetes mellitus),  type 2, uncontrolled ([place]) 05/31/2014    Harrel Carina, MS, OTR/L 03/03/2018, 4:44 PM  [place] MAIN [facility] SERVICES [address] [place], [place], [address] Phone: [phone]   Fax:  [phone]  Name: Sharon [name] MRN: [id number] Date of Birth: 1933/03/11

## 2018-03-03 NOTE — Therapy (Signed)
[place] MAIN [facility] SERVICES [address] [place], [place], [address] Phone: [phone]   Fax:  [phone]  Physical Therapy Treatment  Patient Details  Name: Sharon [name] MRN: [id number] Date of Birth: 20-Oct-1933 Referring Provider (PT): [place], [place]   Encounter Date: 03/03/2018  PT End of Session - 03/03/18 1316    Visit Number  21    Number of Visits  34    Date for PT Re-Evaluation  03/22/18    Authorization Type  1/10    PT Start Time  0105    PT Stop Time  0145    PT Time Calculation (min)  40 min    Equipment Utilized During Treatment  Gait belt    Activity Tolerance  Patient tolerated treatment well;Patient limited by fatigue       Past Medical History:  Diagnosis Date  . Anemia   . Atrial fibrillation ([place])   . Cardiac arrest ([place])   . Cataract   . CHF (congestive heart failure) ([place])   . Diabetes mellitus without complication ([place])   . Edema    feet/ankles occas  . Hip fracture ([place])   . Hyperlipidemia   . Hypertension   . Hypothyroid   . Neuropathy   . Osteomyelitis ([place])    left first metatarsal  . Stroke [facility])     Past Surgical History:  Procedure Laterality Date  . ACHILLES TENDON SURGERY Left 02/08/2015   Procedure: ACHILLES LENGTHENING/KIDNER;  Surgeon: Albertine Patricia, DPM;  Location: [facility] ORS;  Service: Podiatry;  Laterality: Left;  . APPENDECTOMY    . CARDIAC CATHETERIZATION  08/25/13  . CATARACT EXTRACTION W/PHACO Left 07/20/2014   Procedure: CATARACT EXTRACTION PHACO AND INTRAOCULAR LENS PLACEMENT (IOC);  Surgeon: Leandrew Koyanagi, MD;  Location: [facility] ORS;  Service: Ophthalmology;  Laterality: Left;  [place]  1:18                 AP     23.6             CDE   9.69      lot #[phone]  . CHOLECYSTECTOMY    . CORONARY ANGIOPLASTY    . HALLUX VALGUS AKIN Left 02/08/2015   Procedure: HALLUX VALGUS AKIN/ KELLER;  Surgeon: Albertine Patricia, DPM;  Location: [facility] ORS;  Service: Podiatry;  Laterality:  Left;  IVA with Local needs 1 hour for this case   . HEMIARTHROPLASTY HIP Right   . HEMIARTHROPLASTY HIP Left   . INCISION AND DRAINAGE Left 10/11/2014   Procedure: Removal of infected tibial sessmoid;  Surgeon: Albertine Patricia, DPM;  Location: [facility] ORS;  Service: Podiatry;  Laterality: Left;  . IRRIGATION AND DEBRIDEMENT FOOT Right 03/21/2017   Procedure: IRRIGATION AND DEBRIDEMENT FOOT right great toe amputation;  Surgeon: Sharlotte Alamo, DPM;  Location: [facility] ORS;  Service: Podiatry;  Laterality: Right;  . IRRIGATION AND DEBRIDEMENT FOOT Right 04/18/2017   Procedure: IRRIGATION AND DEBRIDEMENT FOOT and application wound vac;  Surgeon: Albertine Patricia, DPM;  Location: [facility] ORS;  Service: Podiatry;  Laterality: Right;  . PERIPHERAL VASCULAR BALLOON ANGIOPLASTY Right 04/17/2017   Procedure: PERIPHERAL VASCULAR BALLOON ANGIOPLASTY;  Surgeon: Katha Cabal, MD;  Location: [place] CV LAB;  Service: Cardiovascular;  Laterality: Right;    There were no vitals filed for this visit.  Subjective Assessment - 03/03/18 1316    Subjective  Patient reports that she fell this morning.     Pertinent History  Patient stopped walking in march. She had  a fall at home and she went to the ER and she had infections in bilateral feet. she had her great toe R foot ampuated and she had a wound on L foot. She went to the hospital and then to [facility] She went back to the MD and had a second surgery in March and then went to another SNF for 3-4 months. She was not able to walk due to being non weight bearing. She was discharged to [facility] an assisted living facility. she had PT 2 x / week. She was able to walk short distances with RW.  She is able to dress herself, she takes herself to the bathroom with assist. She has had several falls.     Limitations  Walking    How long can you stand comfortably?  15 mins if she is holding on    Patient Stated Goals  She wants to walk again.     Currently in Pain?   No/denies    Pain Score  0-No pain    Pain Onset  1 to 4 weeks ago         Treatment:  Nu-step x 5 mins, tactile cueing to R knee for neutral body mechanics, BUE and BLE  Gait with [id number], 100 x 2with poor positioning and bilat. Knees flexed with min assist and grip on her belt due to hx of knees buckling  Sit to standto RWfrom mattable, CGA with tactile cueing for alignment of R knee x 5 trials.  Standinghip extension, abd x 10 with poor form and weakness andmin assist  Standing marching x 10 BLE , with cGA  CGA and Min to mod verbal cues used throughout with increased in postural sway and LOB most seen with narrow base of support and while on uneven surfaces. Continues to have balance deficits typical with diagnosis. Patient performs intermediate level exercises without pain behaviors and needs verbal cuing for postural alignment                       PT Education - 03/03/18 1316    Education Details  HEP    Person(s) Educated  Patient    Methods  Explanation;Demonstration;Tactile cues    Comprehension  Verbalized understanding;Returned demonstration;Need further instruction       PT Short Term Goals - 12/30/17 1152      PT SHORT TERM GOAL #1   Title  Patient will be independent in home exercise program to improve strength/mobility for better functional independence with ADLs.    Baseline  inconsistent    Time  4    Period  Weeks    Status  Partially Met    Target Date  12/28/17      PT SHORT TERM GOAL #2   Title  Patient will transfer from wc to bed with SBA and RW.    Baseline  needs min assist , able to complete stand pivot transfer with RW SBA    Time  4    Period  Weeks    Status  Achieved    Target Date  12/28/17      PT SHORT TERM GOAL #3   Title  Patient will improve her 155MW test from . 2 m/sec to . 5 m/sec with RW    Baseline  . 2 m/sec, 12/30/17: 0.2 m/s    Time  4    Period  Weeks    Status  Not Met     Target  Date  12/28/17        PT Long Term Goals - 03/01/18 1325      PT LONG TERM GOAL #1   Title  Patient will be able to perform 5 sit to stand transfers with RW using her hands to push up from a 17 inch chair    Baseline  performs sit to stand transfer with UE assist from 20 inch wc 5 x in 37 seconds1/13/20=38.58sec from 18 inch chair   03/01/18=43.23 sec and no assist    Time  8    Period  Weeks    Status  Partially Met    Target Date  03/22/18      PT LONG TERM GOAL #2   Title  Patient will be able to stand x 10 mins with SBA to be able to perform standing activiites at the counter    Baseline  patient is able to stand for 6  min, 01/25/18= 7 15 seconds: 03/01/18 = 6 mins     Time  8    Period  Weeks    Status  Partially Met    Target Date  03/22/18      PT LONG TERM GOAL #3   Title  Patient will be able to ambulate with RW and SBA  assist x 200 feet     Baseline  Ambulates with min assist and wc following 100 feet 01/25/18= 125 feet with RW and SBA: 03/01/18 ambulates 145 feet with RW and SBA    Time  8    Period  Weeks    Status  Partially Met    Target Date  03/22/18      PT LONG TERM GOAL #4   Title  patient will impvoe BLE hip strength to 4/5 to improve mobility and safety    Baseline  01/25/18=2/5 hip abd and add,, 3/5 hip flex, unable to test hip ext:  03/01/18= B hip abd 2/5, B hip flex 3/5,     Time  8    Period  Weeks    Status  Partially Met    Target Date  03/22/18      PT LONG TERM GOAL #5   Title  Patient will improve static standing balance from poor plus to fair and be able to reach for objects on the counter top.    Baseline  poor plus static standing balance, poor plus standing balance 12/18,  01/25/18 poor plus , 03/01/18= unable to stand without RW for support or reah fwd    Time  8    Period  Weeks    Status  Not Met    Target Date  03/22/18            Plan - 03/03/18 1336    Clinical Impression Statement  Patient has flexed knees and hips  during standing with Rw and has poor stepping quality during gait. She has weakness in BLE R>L , decreased transfers from wc <>mat, and decreased standing time. She will conitnue to benefit from skilled PT to improve saftey and mobility.    Rehab Potential  Fair    PT Frequency  2x / week    PT Duration  8 weeks    PT Treatment/Interventions  Aquatic Therapy;Gait training;Therapeutic exercise;Therapeutic activities;Functional mobility training;Balance training;Neuromuscular re-education;Patient/family education;Moist Heat    PT Next Visit Plan  LE strengthening, gait training    PT Home Exercise Plan  bridges, marching, hip abd/ER GTB, SLR    Consulted and Agree  with Plan of Care  Patient;Other (Comment)   friend      Patient will benefit from skilled therapeutic intervention in order to improve the following deficits and impairments:  Abnormal gait, Decreased balance, Decreased endurance, Decreased mobility, Difficulty walking, Impaired UE functional use, Decreased strength, Decreased activity tolerance  Visit Diagnosis: Muscle weakness (generalized)  Other lack of coordination  Difficulty in walking, not elsewhere classified     Problem List Patient Active Problem List   Diagnosis Date Noted  . Atherosclerosis of native arteries of the extremities with ulceration ([place]) 05/07/2017  . Non-healing ulcer ([place]) 04/16/2017  . Diabetic neuropathy ([place]) 04/07/2017  . Septic shock ([place]) 03/20/2017  . CKD (chronic kidney disease), stage III ([place]) 03/19/2017  . Goals of care, counseling/discussion 03/03/2017  . PAD (peripheral artery disease) ([place]) 02/03/2017  . Abdominal aortic stenosis 02/03/2017  . Bilateral lower extremity edema 02/03/2017  . Anemia 01/29/2017  . B12 deficiency 01/29/2017  . Pancreatic mass 01/29/2017  . Hyponatremia 07/07/2015  . Foot ulcer ([place]) 02/07/2015  . ARF (acute renal failure) ([place]) 11/05/2014  . Diabetic foot infection ([place]) 10/10/2014  . Chronic  diastolic heart failure ([place]) 05/31/2014  . HTN (hypertension) 05/31/2014  . DM (diabetes mellitus), type 2, uncontrolled ([place]) 05/31/2014    Alanson Puls, [place] DPT 03/03/2018, 1:55 PM  [place] MAIN [facility] SERVICES [address] [place], [place], [address] Phone: [phone]   Fax:  [phone]  Name: ANNA BEAIRD MRN: [id number] Date of Birth: 11-28-1933

## 2018-03-08 ENCOUNTER — Emergency Department: Payer: PPO

## 2018-03-08 ENCOUNTER — Encounter: Payer: Self-pay | Admitting: Physical Therapy

## 2018-03-08 ENCOUNTER — Other Ambulatory Visit: Payer: Self-pay

## 2018-03-08 ENCOUNTER — Ambulatory Visit: Payer: PPO | Admitting: Physical Therapy

## 2018-03-08 ENCOUNTER — Ambulatory Visit: Payer: PPO | Admitting: Occupational Therapy

## 2018-03-08 ENCOUNTER — Inpatient Hospital Stay
Admission: EM | Admit: 2018-03-08 | Discharge: 2018-03-12 | DRG: 683 | Disposition: A | Discharge status: Skilled Nursing Facility | Payer: PPO | Source: Skilled Nursing Facility | Attending: Internal Medicine | Admitting: Internal Medicine

## 2018-03-08 ENCOUNTER — Encounter: Payer: Self-pay | Admitting: Emergency Medicine

## 2018-03-08 DIAGNOSIS — R278 Other lack of coordination: Secondary | ICD-10-CM

## 2018-03-08 DIAGNOSIS — N289 Disorder of kidney and ureter, unspecified: Secondary | ICD-10-CM | POA: Diagnosis not present

## 2018-03-08 DIAGNOSIS — Z8673 Personal history of transient ischemic attack (TIA), and cerebral infarction without residual deficits: Secondary | ICD-10-CM | POA: Diagnosis not present

## 2018-03-08 DIAGNOSIS — Z888 Allergy status to other drugs, medicaments and biological substances status: Secondary | ICD-10-CM

## 2018-03-08 DIAGNOSIS — Z7984 Long term (current) use of oral hypoglycemic drugs: Secondary | ICD-10-CM

## 2018-03-08 DIAGNOSIS — N183 Chronic kidney disease, stage 3 (moderate): Secondary | ICD-10-CM | POA: Diagnosis present

## 2018-03-08 DIAGNOSIS — E785 Hyperlipidemia, unspecified: Secondary | ICD-10-CM | POA: Diagnosis not present

## 2018-03-08 DIAGNOSIS — E1151 Type 2 diabetes mellitus with diabetic peripheral angiopathy without gangrene: Secondary | ICD-10-CM | POA: Diagnosis present

## 2018-03-08 DIAGNOSIS — E1142 Type 2 diabetes mellitus with diabetic polyneuropathy: Secondary | ICD-10-CM | POA: Diagnosis not present

## 2018-03-08 DIAGNOSIS — E039 Hypothyroidism, unspecified: Secondary | ICD-10-CM | POA: Diagnosis not present

## 2018-03-08 DIAGNOSIS — E1165 Type 2 diabetes mellitus with hyperglycemia: Secondary | ICD-10-CM | POA: Diagnosis present

## 2018-03-08 DIAGNOSIS — I5033 Acute on chronic diastolic (congestive) heart failure: Secondary | ICD-10-CM | POA: Diagnosis not present

## 2018-03-08 DIAGNOSIS — IMO0002 Reserved for concepts with insufficient information to code with codable children: Secondary | ICD-10-CM | POA: Diagnosis present

## 2018-03-08 DIAGNOSIS — M6281 Muscle weakness (generalized): Secondary | ICD-10-CM

## 2018-03-08 DIAGNOSIS — I4891 Unspecified atrial fibrillation: Secondary | ICD-10-CM | POA: Diagnosis present

## 2018-03-08 DIAGNOSIS — I130 Hypertensive heart and chronic kidney disease with heart failure and stage 1 through stage 4 chronic kidney disease, or unspecified chronic kidney disease: Secondary | ICD-10-CM | POA: Diagnosis not present

## 2018-03-08 DIAGNOSIS — I509 Heart failure, unspecified: Secondary | ICD-10-CM | POA: Diagnosis not present

## 2018-03-08 DIAGNOSIS — E871 Hypo-osmolality and hyponatremia: Secondary | ICD-10-CM | POA: Diagnosis not present

## 2018-03-08 DIAGNOSIS — T502X5A Adverse effect of carbonic-anhydrase inhibitors, benzothiadiazides and other diuretics, initial encounter: Secondary | ICD-10-CM | POA: Diagnosis present

## 2018-03-08 DIAGNOSIS — I5032 Chronic diastolic (congestive) heart failure: Secondary | ICD-10-CM | POA: Diagnosis present

## 2018-03-08 DIAGNOSIS — N2889 Other specified disorders of kidney and ureter: Secondary | ICD-10-CM | POA: Diagnosis not present

## 2018-03-08 DIAGNOSIS — Z8249 Family history of ischemic heart disease and other diseases of the circulatory system: Secondary | ICD-10-CM | POA: Diagnosis not present

## 2018-03-08 DIAGNOSIS — Z87891 Personal history of nicotine dependence: Secondary | ICD-10-CM | POA: Diagnosis not present

## 2018-03-08 DIAGNOSIS — N179 Acute kidney failure, unspecified: Principal | ICD-10-CM | POA: Diagnosis present

## 2018-03-08 DIAGNOSIS — I70239 Atherosclerosis of native arteries of right leg with ulceration of unspecified site: Secondary | ICD-10-CM | POA: Diagnosis present

## 2018-03-08 DIAGNOSIS — R262 Difficulty in walking, not elsewhere classified: Secondary | ICD-10-CM

## 2018-03-08 DIAGNOSIS — N170 Acute kidney failure with tubular necrosis: Secondary | ICD-10-CM | POA: Diagnosis not present

## 2018-03-08 DIAGNOSIS — E1122 Type 2 diabetes mellitus with diabetic chronic kidney disease: Secondary | ICD-10-CM | POA: Diagnosis present

## 2018-03-08 DIAGNOSIS — I739 Peripheral vascular disease, unspecified: Secondary | ICD-10-CM | POA: Diagnosis present

## 2018-03-08 DIAGNOSIS — R600 Localized edema: Secondary | ICD-10-CM | POA: Diagnosis not present

## 2018-03-08 DIAGNOSIS — I10 Essential (primary) hypertension: Secondary | ICD-10-CM | POA: Diagnosis present

## 2018-03-08 DIAGNOSIS — L8990 Pressure ulcer of unspecified site, unspecified stage: Secondary | ICD-10-CM

## 2018-03-08 DIAGNOSIS — Z79890 Hormone replacement therapy (postmenopausal): Secondary | ICD-10-CM

## 2018-03-08 DIAGNOSIS — N189 Chronic kidney disease, unspecified: Secondary | ICD-10-CM

## 2018-03-08 DIAGNOSIS — Z8674 Personal history of sudden cardiac arrest: Secondary | ICD-10-CM

## 2018-03-08 DIAGNOSIS — I70249 Atherosclerosis of native arteries of left leg with ulceration of unspecified site: Secondary | ICD-10-CM | POA: Diagnosis not present

## 2018-03-08 DIAGNOSIS — L98499 Non-pressure chronic ulcer of skin of other sites with unspecified severity: Secondary | ICD-10-CM

## 2018-03-08 DIAGNOSIS — I5031 Acute diastolic (congestive) heart failure: Secondary | ICD-10-CM | POA: Diagnosis not present

## 2018-03-08 DIAGNOSIS — J441 Chronic obstructive pulmonary disease with (acute) exacerbation: Secondary | ICD-10-CM | POA: Diagnosis not present

## 2018-03-08 LAB — CBC WITH DIFFERENTIAL/PLATELET
Abs Immature Granulocytes: 0.06 10*3/uL (ref 0.00–0.07)
Basophils Absolute: 0.1 10*3/uL (ref 0.0–0.1)
Basophils Relative: 0 %
Eosinophils Absolute: 0.2 10*3/uL (ref 0.0–0.5)
Eosinophils Relative: 2 %
HCT: 38 % (ref 36.0–46.0)
HEMOGLOBIN: 12.5 g/dL (ref 12.0–15.0)
Immature Granulocytes: 1 %
LYMPHS PCT: 16 %
Lymphs Abs: 1.8 10*3/uL (ref 0.7–4.0)
MCH: 29.1 pg (ref 26.0–34.0)
MCHC: 32.9 g/dL (ref 30.0–36.0)
MCV: 88.4 fL (ref 80.0–100.0)
Monocytes Absolute: 1.1 10*3/uL — ABNORMAL HIGH (ref 0.1–1.0)
Monocytes Relative: 10 %
Neutro Abs: 8.1 10*3/uL — ABNORMAL HIGH (ref 1.7–7.7)
Neutrophils Relative %: 71 %
Platelets: 207 10*3/uL (ref 150–400)
RBC: 4.3 MIL/uL (ref 3.87–5.11)
RDW: 13.9 % (ref 11.5–15.5)
WBC: 11.3 10*3/uL — ABNORMAL HIGH (ref 4.0–10.5)
nRBC: 0 % (ref 0.0–0.2)

## 2018-03-08 LAB — COMPREHENSIVE METABOLIC PANEL
ALT: 50 U/L — ABNORMAL HIGH (ref 0–44)
AST: 51 U/L — ABNORMAL HIGH (ref 15–41)
Albumin: 4.2 g/dL (ref 3.5–5.0)
Alkaline Phosphatase: 87 U/L (ref 38–126)
Anion gap: 13 (ref 5–15)
BUN: 76 mg/dL — ABNORMAL HIGH (ref 8–23)
CO2: 22 mmol/L (ref 22–32)
Calcium: 8.9 mg/dL (ref 8.9–10.3)
Chloride: 95 mmol/L — ABNORMAL LOW (ref 98–111)
Creatinine, Ser: 2.7 mg/dL — ABNORMAL HIGH (ref 0.44–1.00)
GFR calc Af Amer: 18 mL/min — ABNORMAL LOW (ref >60)
GFR, EST NON AFRICAN AMERICAN: 16 mL/min — AB (ref >60)
Glucose, Bld: 409 mg/dL — ABNORMAL HIGH (ref 70–99)
Potassium: 4.6 mmol/L (ref 3.5–5.1)
Sodium: 130 mmol/L — ABNORMAL LOW (ref 135–145)
Total Bilirubin: 0.6 mg/dL (ref 0.3–1.2)
Total Protein: 7.6 g/dL (ref 6.5–8.1)

## 2018-03-08 LAB — BRAIN NATRIURETIC PEPTIDE: B Natriuretic Peptide: 96 pg/mL (ref 0.0–100.0)

## 2018-03-08 LAB — TROPONIN I: Troponin I: <0.03 ng/mL (ref <0.03)

## 2018-03-08 MED ORDER — FUROSEMIDE 10 MG/ML IJ SOLN
60.0000 mg | Freq: Once | INTRAMUSCULAR | Status: AC
  Administered 2018-03-08: 60 mg via INTRAVENOUS
  Filled 2018-03-08: qty 8

## 2018-03-08 MED ORDER — SODIUM CHLORIDE 0.9 % IV SOLN
Freq: Once | INTRAVENOUS | Status: DC
  Administered 2018-03-09: 04:00:00 via INTRAVENOUS

## 2018-03-08 MED ORDER — INSULIN ASPART 100 UNIT/ML ~~LOC~~ SOLN
8.0000 [IU] | Freq: Once | SUBCUTANEOUS | Status: AC
  Administered 2018-03-08: 8 [IU] via INTRAVENOUS
  Filled 2018-03-08: qty 1

## 2018-03-08 NOTE — ED Provider Notes (Signed)
[facility] Emergency Department Provider Note  Time seen: 9:33 PM  I have reviewed the triage vital signs and the nursing notes.   HISTORY  Chief Complaint Abnormal Labs   HPI Trenae Brunke Das is a 85 y.o. female with a past medical history of anemia, atrial fibrillation, CHF, diabetes, hypertension, hyperlipidemia, prior CVA, presents to the emergency department for weakness, cough and acute renal insufficiency.  According to the patient and son patient lives at a nursing facility, has been having progressive increase in lower extremity edema to the point of weeping, was seen by her primary care doctor approximately 1 month ago, patient has been coughing wet sounding cough but largely nonproductive, some weakness went back to her primary care doctor today had lab work performed showing acute renal insufficiency baseline creatinine is between 1.1-1.4 currently 2.7.  Patient denies any chest pain or abdominal pain.  Denies any fever.  Patient does have a wet sounding cough during examination.   Past Medical History:  Diagnosis Date  . Anemia   . Atrial fibrillation ([place])   . Cardiac arrest ([place])   . Cataract   . CHF (congestive heart failure) ([place])   . Diabetes mellitus without complication ([place])   . Edema    feet/ankles occas  . Hip fracture ([place])   . Hyperlipidemia   . Hypertension   . Hypothyroid   . Neuropathy   . Osteomyelitis ([place])    left first metatarsal  . Stroke [facility])     Patient Active Problem List   Diagnosis Date Noted  . Atherosclerosis of native arteries of the extremities with ulceration ([place]) 05/07/2017  . Non-healing ulcer ([place]) 04/16/2017  . Diabetic neuropathy ([place]) 04/07/2017  . Septic shock ([place]) 03/20/2017  . CKD (chronic kidney disease), stage III ([place]) 03/19/2017  . Goals of care, counseling/discussion 03/03/2017  . PAD (peripheral artery disease) ([place]) 02/03/2017  . Abdominal aortic stenosis 02/03/2017  . Bilateral lower  extremity edema 02/03/2017  . Anemia 01/29/2017  . B12 deficiency 01/29/2017  . Pancreatic mass 01/29/2017  . Hyponatremia 07/07/2015  . Foot ulcer ([place]) 02/07/2015  . ARF (acute renal failure) ([place]) 11/05/2014  . Diabetic foot infection ([place]) 10/10/2014  . Chronic diastolic heart failure ([place]) 05/31/2014  . HTN (hypertension) 05/31/2014  . DM (diabetes mellitus), type 2, uncontrolled ([place]) 05/31/2014    Past Surgical History:  Procedure Laterality Date  . ACHILLES TENDON SURGERY Left 02/08/2015   Procedure: ACHILLES LENGTHENING/KIDNER;  Surgeon: Albertine Patricia, DPM;  Location: [facility] ORS;  Service: Podiatry;  Laterality: Left;  . APPENDECTOMY    . CARDIAC CATHETERIZATION  08/25/13  . CATARACT EXTRACTION W/PHACO Left 07/20/2014   Procedure: CATARACT EXTRACTION PHACO AND INTRAOCULAR LENS PLACEMENT (IOC);  Surgeon: Leandrew Koyanagi, MD;  Location: [facility] ORS;  Service: Ophthalmology;  Laterality: Left;  [place]  1:18                 AP     23.6             CDE   9.69      lot #[phone]  . CHOLECYSTECTOMY    . CORONARY ANGIOPLASTY    . HALLUX VALGUS AKIN Left 02/08/2015   Procedure: HALLUX VALGUS AKIN/ KELLER;  Surgeon: Albertine Patricia, DPM;  Location: [facility] ORS;  Service: Podiatry;  Laterality: Left;  IVA with Local needs 1 hour for this case   . HEMIARTHROPLASTY HIP Right   . HEMIARTHROPLASTY HIP Left   . INCISION AND DRAINAGE Left 10/11/2014  Procedure: Removal of infected tibial sessmoid;  Surgeon: Albertine Patricia, DPM;  Location: [facility] ORS;  Service: Podiatry;  Laterality: Left;  . IRRIGATION AND DEBRIDEMENT FOOT Right 03/21/2017   Procedure: IRRIGATION AND DEBRIDEMENT FOOT right great toe amputation;  Surgeon: Sharlotte Alamo, DPM;  Location: [facility] ORS;  Service: Podiatry;  Laterality: Right;  . IRRIGATION AND DEBRIDEMENT FOOT Right 04/18/2017   Procedure: IRRIGATION AND DEBRIDEMENT FOOT and application wound vac;  Surgeon: Albertine Patricia, DPM;  Location: [facility] ORS;  Service: Podiatry;   Laterality: Right;  . PERIPHERAL VASCULAR BALLOON ANGIOPLASTY Right 04/17/2017   Procedure: PERIPHERAL VASCULAR BALLOON ANGIOPLASTY;  Surgeon: Katha Cabal, MD;  Location: [place] [facility];  Service: Cardiovascular;  Laterality: Right;    Prior to Admission medications   Medication Sig Start Date End Date Taking? Authorizing Provider  acetaminophen (TYLENOL) 500 MG tablet Take 500 mg by mouth every 4 (four) hours as needed for mild pain or moderate pain.    [provider]  Amino Acids-Protein Hydrolys (FEEDING SUPPLEMENT, PRO-STAT SUGAR FREE 64,) LIQD Take 30 mLs by mouth 2 (two) times daily between meals.    [provider]  amiodarone (PACERONE) 200 MG tablet Take 1 tablet (200 mg total) by mouth daily. 03/24/17   Gladstone Lighter, MD  amitriptyline (ELAVIL) 10 MG tablet Take 10 mg by mouth at bedtime.    [provider]  atorvastatin (LIPITOR) 10 MG tablet Take by mouth.    [provider]  bisacodyl (DULCOLAX) 5 MG EC tablet Take 2 tablets (10 mg total) by mouth every other day. 03/25/17   Gladstone Lighter, MD  cholecalciferol (VITAMIN D) 1000 units tablet Take 1,000 Units by mouth daily.    [provider]  ferrous sulfate 325 (65 FE) MG tablet Take 325 mg by mouth 2 (two) times daily.    [provider]  furosemide (LASIX) 20 MG tablet Take 1 tablet (20 mg total) by mouth daily. 03/23/17   Gladstone Lighter, MD  gabapentin (NEURONTIN) 100 MG capsule Take 100 mg by mouth 4 (four) times daily.    [provider]  glimepiride (AMARYL) 2 MG tablet Take 2 mg by mouth daily with breakfast.     [provider]  GLUCERNA (GLUCERNA) LIQD Take 237 mLs by mouth 2 (two) times daily between meals.    [provider]  levothyroxine (SYNTHROID, LEVOTHROID) 112 MCG tablet Take 112 mcg by mouth daily before breakfast. 30 minutes before breakfast    [provider]  losartan (COZAAR) 25 MG tablet Take 25 mg  by mouth 2 (two) times daily.    [provider]  metoprolol tartrate (LOPRESSOR) 25 MG tablet Take 1 tablet (25 mg total) by mouth 2 (two) times daily. 04/21/17   Saundra Shelling, MD  Multiple Vitamins-Minerals (PRESERVISION AREDS 2+MULTI VIT PO) Take 1 capsule by mouth 2 (two) times daily.     [provider]  nystatin cream (MYCOSTATIN) Apply 1 application topically 2 (two) times daily.    [provider]  piperacillin-tazobactam (ZOSYN) 3.375 (3-0.375) g injection Inject into the vein.    [provider]  potassium chloride (MICRO-K) 10 MEQ CR capsule Take 10 mEq by mouth daily.    [provider]  senna-docusate (SENOKOT-S) 8.6-50 MG tablet Take 1 tablet by mouth 2 (two) times daily. 03/23/17   Gladstone Lighter, MD  simvastatin (ZOCOR) 20 MG tablet Take 20 mg by mouth daily.    [provider]  Skin Protectants, Misc. (ENDIT EX) Apply  1 application topically 2 (two) times daily.    [provider]  traMADol (ULTRAM) 50 MG tablet Take 1 tablet (50 mg total) by mouth every 6 (six) hours as needed. 04/21/17   Saundra Shelling, MD  vitamin C (ASCORBIC ACID) 500 MG tablet Take 500 mg by mouth 2 (two) times daily.    [provider]    Allergies  Allergen Reactions  . Carbamazepine Other (See Comments)    Reaction: unknown  . Cymbalta [Duloxetine Hcl] Other (See Comments)    Confusion, disorientation  . Duloxetine     Other reaction(s): Other (See Comments) Confusion, disorientation  . Lyrica [Pregabalin] Other (See Comments)    Patient and son states this makes the patient confused.  . Temazepam     Other reaction(s): Hallucination    Family History  Problem Relation Age of Onset  . Heart attack Mother     Social History Social History   Tobacco Use  . Smoking status: Former Smoker    Packs/day: 0.50    Years: 7.00    Pack years: 3.50    Types: Cigarettes    Last attempt to quit: 05/30/1989    Years since  quitting: 28.7  . Smokeless tobacco: Never Used  Substance Use Topics  . Alcohol use: No    Alcohol/week: 0.0 standard drinks  . Drug use: No    Review of Systems Constitutional: Negative for fever. Cardiovascular: Negative for chest pain. Respiratory: Negative for shortness of breath.  Positive for cough. Gastrointestinal: Negative for abdominal pain, vomiting Musculoskeletal: Increased lower extremity edema. Skin: Negative for skin complaints  Neurological: Negative for headache All other ROS negative  ____________________________________________   PHYSICAL EXAM:  VITAL SIGNS: ED Triage Vitals [03/08/18 1553]  Enc Vitals Group     BP (!) 198/57     Pulse Rate 64     Resp 18     Temp 98.4 F (36.9 C)     Temp Source Oral     SpO2 95 %     Weight 155 lb (70.3 kg)     Height 5\' 5"  (1.651 m)     Head Circumference      Peak Flow      Pain Score 0     Pain Loc      Pain Edu?      Excl. in [place]?    Constitutional: Alert, no distress. Eyes: Normal exam ENT   Head: Normocephalic and atraumatic.   Mouth/Throat: Mucous membranes are moist. Cardiovascular: Normal rate, regular rhythm.  Respiratory: Normal respiratory effort without tachypnea nor retractions. Breath sounds are clear.  Frequent wet sounding cough. Gastrointestinal: Soft and nontender. No distention.   Musculoskeletal: 1-2+ lower extreme edema, compression stockings in place. Neurologic:  Normal speech and language. No gross focal neurologic deficits  Skin:  Skin is warm, dry and intact.  Psychiatric: Mood and affect are normal.   ____________________________________________   RADIOLOGY  Chest x-ray shows cardiomegaly with vascular congestion and interstitial changes.  ____________________________________________   INITIAL IMPRESSION / ASSESSMENT AND PLAN / ED COURSE  Pertinent labs & imaging results that were available during my care of the patient were reviewed by me and considered in my  medical decision making (see chart for details).  Patient presents to the emergency department for abnormal labs by her PCP Dr. Sabra Heck.  Patient has been seen the PCP for increased lower extremity edema, weakness at times and weeping lower extremities at times.  Patient's lab work has resulted showing  an elevation of creatinine of 2.7 baseline but appears to be below 1.5.  Consistent with acute renal insufficiency.  Patient's blood glucose is elevated near 500 we will dose insulin, we will lightly hydrate for hyperglycemia, while diuresing with IV Lasix.  Patient will require admission to the hospital service for continued diuresis as well as renal monitoring.  ____________________________________________   FINAL CLINICAL IMPRESSION(S) / ED DIAGNOSES  CHF exacerbation Acute renal insufficiency Per glycemia   Harvest Dark, MD 03/08/18 2144

## 2018-03-08 NOTE — Therapy (Signed)
[place] MAIN [facility] SERVICES [address] [place], [place], [address] Phone: [phone]   Fax:  [phone]  Physical Therapy Treatment  Patient Details  Name: Sharon [name] MRN: [id number] Date of Birth: 07-Oct-1933 Referring Provider (PT): [place], [place]   Encounter Date: 03/08/2018  PT End of Session - 03/08/18 1344    Visit Number  22    Number of Visits  34    Date for PT Re-Evaluation  03/22/18    Authorization Type  2/10    PT Start Time  0120    PT Stop Time  0145    PT Time Calculation (min)  25 min    Equipment Utilized During Treatment  Gait belt    Activity Tolerance  Patient tolerated treatment well;Patient limited by fatigue       Past Medical History:  Diagnosis Date  . Anemia   . Atrial fibrillation ([place])   . Cardiac arrest ([place])   . Cataract   . CHF (congestive heart failure) ([place])   . Diabetes mellitus without complication ([place])   . Edema    feet/ankles occas  . Hip fracture ([place])   . Hyperlipidemia   . Hypertension   . Hypothyroid   . Neuropathy   . Osteomyelitis ([place])    left first metatarsal  . Stroke [facility])     Past Surgical History:  Procedure Laterality Date  . ACHILLES TENDON SURGERY Left 02/08/2015   Procedure: ACHILLES LENGTHENING/KIDNER;  Surgeon: Albertine Patricia, DPM;  Location: [facility] ORS;  Service: Podiatry;  Laterality: Left;  . APPENDECTOMY    . CARDIAC CATHETERIZATION  08/25/13  . CATARACT EXTRACTION W/PHACO Left 07/20/2014   Procedure: CATARACT EXTRACTION PHACO AND INTRAOCULAR LENS PLACEMENT (IOC);  Surgeon: Leandrew Koyanagi, MD;  Location: [facility] ORS;  Service: Ophthalmology;  Laterality: Left;  [place]  1:18                 AP     23.6             CDE   9.69      lot #[phone]  . CHOLECYSTECTOMY    . CORONARY ANGIOPLASTY    . HALLUX VALGUS AKIN Left 02/08/2015   Procedure: HALLUX VALGUS AKIN/ KELLER;  Surgeon: Albertine Patricia, DPM;  Location: [facility] ORS;  Service: Podiatry;  Laterality:  Left;  IVA with Local needs 1 hour for this case   . HEMIARTHROPLASTY HIP Right   . HEMIARTHROPLASTY HIP Left   . INCISION AND DRAINAGE Left 10/11/2014   Procedure: Removal of infected tibial sessmoid;  Surgeon: Albertine Patricia, DPM;  Location: [facility] ORS;  Service: Podiatry;  Laterality: Left;  . IRRIGATION AND DEBRIDEMENT FOOT Right 03/21/2017   Procedure: IRRIGATION AND DEBRIDEMENT FOOT right great toe amputation;  Surgeon: Sharlotte Alamo, DPM;  Location: [facility] ORS;  Service: Podiatry;  Laterality: Right;  . IRRIGATION AND DEBRIDEMENT FOOT Right 04/18/2017   Procedure: IRRIGATION AND DEBRIDEMENT FOOT and application wound vac;  Surgeon: Albertine Patricia, DPM;  Location: [facility] ORS;  Service: Podiatry;  Laterality: Right;  . PERIPHERAL VASCULAR BALLOON ANGIOPLASTY Right 04/17/2017   Procedure: PERIPHERAL VASCULAR BALLOON ANGIOPLASTY;  Surgeon: Katha Cabal, MD;  Location: [place] [facility];  Service: Cardiovascular;  Laterality: Right;    There were no vitals filed for this visit.  Subjective Assessment - 03/08/18 1342    Subjective  Patient reports that she is wearing una boots due to swelling in her legs and she has a MD apt today  at 3:00.    Pertinent History  Patient stopped walking in march. She had a fall at home and she went to the ER and she had infections in bilateral feet. she had her great toe R foot ampuated and she had a wound on L foot. She went to the hospital and then to [facility]. She went back to the MD and had a second surgery in March and then went to another SNF for 3-4 months. She was not able to walk due to being non weight bearing. She was discharged to [facility] an assisted living facility. she had PT 2 x / week. She was able to walk short distances with RW.  She is able to dress herself, she takes herself to the bathroom with assist. She has had several falls.     Limitations  Walking    How long can you stand comfortably?  15 mins if she is holding on    Patient Stated  Goals  She wants to walk again.     Currently in Pain?  No/denies    Pain Score  0-No pain    Pain Onset  1 to 4 weeks ago        Treatment: Sit to stand from wc level to standing with RW    gait training with RW x 25 feet, x 50 feet, x 50 feet with cues to correct posture for knee extension and hip extension, trunk extension and head positioning.  Patient needs continuous cues perform better posture during standing and ambulation .  Patient has fatigue with standing.                     PT Education - 03/08/18 1343    Education Details  HEP    Person(s) Educated  Patient    Methods  Explanation    Comprehension  Verbalized understanding       PT Short Term Goals - 12/30/17 1152      PT SHORT TERM GOAL #1   Title  Patient will be independent in home exercise program to improve strength/mobility for better functional independence with ADLs.    Baseline  inconsistent    Time  4    Period  Weeks    Status  Partially Met    Target Date  12/28/17      PT SHORT TERM GOAL #2   Title  Patient will transfer from wc to bed with SBA and RW.    Baseline  needs min assist , able to complete stand pivot transfer with RW SBA    Time  4    Period  Weeks    Status  Achieved    Target Date  12/28/17      PT SHORT TERM GOAL #3   Title  Patient will improve her 18 MW test from . 2 m/sec to . 5 m/sec with RW    Baseline  . 2 m/sec, 12/30/17: 0.2 m/s    Time  4    Period  Weeks    Status  Not Met    Target Date  12/28/17        PT Long Term Goals - 03/01/18 1325      PT LONG TERM GOAL #1   Title  Patient will be able to perform 5 sit to stand transfers with RW using her hands to push up from a 17 inch chair    Baseline  performs sit to stand transfer with UE assist  from 20 inch wc 5 x in 37 seconds1/13/20=38.58sec from 18 inch chair   03/01/18=43.23 sec and no assist    Time  8    Period  Weeks    Status  Partially Met    Target Date  03/22/18      PT LONG  TERM GOAL #2   Title  Patient will be able to stand x 10 mins with SBA to be able to perform standing activiites at the counter    Baseline  patient is able to stand for 6  min, 01/25/18= 7 15 seconds: 03/01/18 = 6 mins     Time  8    Period  Weeks    Status  Partially Met    Target Date  03/22/18      PT LONG TERM GOAL #3   Title  Patient will be able to ambulate with RW and SBA  assist x 200 feet     Baseline  Ambulates with min assist and wc following 100 feet 01/25/18= 125 feet with RW and SBA: 03/01/18 ambulates 145 feet with RW and SBA    Time  8    Period  Weeks    Status  Partially Met    Target Date  03/22/18      PT LONG TERM GOAL #4   Title  patient will impvoe BLE hip strength to 4/5 to improve mobility and safety    Baseline  01/25/18=2/5 hip abd and add,, 3/5 hip flex, unable to test hip ext:  03/01/18= B hip abd 2/5, B hip flex 3/5,     Time  8    Period  Weeks    Status  Partially Met    Target Date  03/22/18      PT LONG TERM GOAL #5   Title  Patient will improve static standing balance from poor plus to fair and be able to reach for objects on the counter top.    Baseline  poor plus static standing balance, poor plus standing balance 12/18,  01/25/18 poor plus , 03/01/18= unable to stand without RW for support or reah fwd    Time  8    Period  Weeks    Status  Not Met    Target Date  03/22/18            Plan - 03/08/18 1352    Clinical Impression Statement  Patient has flexed knees and hips during standing with Rw and has poor stepping quality during gait. She has weakness in BLE R>L , decreased transfers from wc <>mat, and decreased standing time. She will conitnue to benefit from skilled PT to improve saftey and mobility.    Rehab Potential  Fair    PT Frequency  2x / week    PT Duration  8 weeks    PT Treatment/Interventions  Aquatic Therapy;Gait training;Therapeutic exercise;Therapeutic activities;Functional mobility training;Balance training;Neuromuscular  re-education;Patient/family education;Moist Heat    PT Next Visit Plan  LE strengthening, gait training    PT Home Exercise Plan  bridges, marching, hip abd/ER GTB, SLR    Consulted and Agree with Plan of Care  Patient;Other (Comment)   friend      Patient will benefit from skilled therapeutic intervention in order to improve the following deficits and impairments:  Abnormal gait, Decreased balance, Decreased endurance, Decreased mobility, Difficulty walking, Impaired UE functional use, Decreased strength, Decreased activity tolerance  Visit Diagnosis: Muscle weakness (generalized)  Other lack of coordination  Difficulty in walking, not  elsewhere classified     Problem List Patient Active Problem List   Diagnosis Date Noted  . Atherosclerosis of native arteries of the extremities with ulceration ([place]) 05/07/2017  . Non-healing ulcer ([place]) 04/16/2017  . Diabetic neuropathy ([place]) 04/07/2017  . Septic shock ([place]) 03/20/2017  . CKD (chronic kidney disease), stage III ([place]) 03/19/2017  . Goals of care, counseling/discussion 03/03/2017  . PAD (peripheral artery disease) ([place]) 02/03/2017  . Abdominal aortic stenosis 02/03/2017  . Bilateral lower extremity edema 02/03/2017  . Anemia 01/29/2017  . B12 deficiency 01/29/2017  . Pancreatic mass 01/29/2017  . Hyponatremia 07/07/2015  . Foot ulcer ([place]) 02/07/2015  . ARF (acute renal failure) ([place]) 11/05/2014  . Diabetic foot infection ([place]) 10/10/2014  . Chronic diastolic heart failure ([place]) 05/31/2014  . HTN (hypertension) 05/31/2014  . DM (diabetes mellitus), type 2, uncontrolled ([place]) 05/31/2014    Alanson Puls, [place] DPT 03/08/2018, 1:54 PM  [place] [facility] SERVICES [address] [place], [place], [address] Phone: [phone]   Fax:  [phone]  Name: JONNE ROTE MRN: [id number] Date of Birth: [birth year]

## 2018-03-08 NOTE — ED Notes (Signed)
Pt's son reports pt has had increasing SOB, pt's response is "I don't know", pt's son also reports hemoptysis.

## 2018-03-08 NOTE — Therapy (Signed)
[place] MAIN [facility] SERVICES [address] [place], [place], [address] Phone: [phone]   Fax:  [phone]  Occupational Therapy Treatment  Patient Details  Name: Sharon [name] MRN: [id number] Date of Birth: 05/11/1933 Referring Provider (OT): Emily Filbert   Encounter Date: 03/08/2018  OT End of Session - 03/08/18 1434    Visit Number  14    Number of Visits  24    Date for OT Re-Evaluation  03/24/18    Authorization Type  Progress reporting period starting 12/30/2017    OT Start Time  1345    OT Stop Time  1430    OT Time Calculation (min)  45 min    Activity Tolerance  Patient tolerated treatment well    Behavior During Therapy  [facility] for tasks assessed/performed       Past Medical History:  Diagnosis Date  . Anemia   . Atrial fibrillation ([place])   . Cardiac arrest ([place])   . Cataract   . CHF (congestive heart failure) ([place])   . Diabetes mellitus without complication ([place])   . Edema    feet/ankles occas  . Hip fracture ([place])   . Hyperlipidemia   . Hypertension   . Hypothyroid   . Neuropathy   . Osteomyelitis ([place])    left first metatarsal  . Stroke [facility])     Past Surgical History:  Procedure Laterality Date  . ACHILLES TENDON SURGERY Left 02/08/2015   Procedure: ACHILLES LENGTHENING/KIDNER;  Surgeon: Albertine Patricia, DPM;  Location: [facility] ORS;  Service: Podiatry;  Laterality: Left;  . APPENDECTOMY    . CARDIAC CATHETERIZATION  08/25/13  . CATARACT EXTRACTION W/PHACO Left 07/20/2014   Procedure: CATARACT EXTRACTION PHACO AND INTRAOCULAR LENS PLACEMENT (IOC);  Surgeon: Leandrew Koyanagi, MD;  Location: [facility] ORS;  Service: Ophthalmology;  Laterality: Left;  [place]  1:18                 AP     23.6             CDE   9.69      lot #[phone]  . CHOLECYSTECTOMY    . CORONARY ANGIOPLASTY    . HALLUX VALGUS AKIN Left 02/08/2015   Procedure: HALLUX VALGUS AKIN/ KELLER;  Surgeon: Albertine Patricia, DPM;  Location: [facility] ORS;  Service:  Podiatry;  Laterality: Left;  IVA with Local needs 1 hour for this case   . HEMIARTHROPLASTY HIP Right   . HEMIARTHROPLASTY HIP Left   . INCISION AND DRAINAGE Left 10/11/2014   Procedure: Removal of infected tibial sessmoid;  Surgeon: Albertine Patricia, DPM;  Location: [facility] ORS;  Service: Podiatry;  Laterality: Left;  . IRRIGATION AND DEBRIDEMENT FOOT Right 03/21/2017   Procedure: IRRIGATION AND DEBRIDEMENT FOOT right great toe amputation;  Surgeon: Sharlotte Alamo, DPM;  Location: [facility] ORS;  Service: Podiatry;  Laterality: Right;  . IRRIGATION AND DEBRIDEMENT FOOT Right 04/18/2017   Procedure: IRRIGATION AND DEBRIDEMENT FOOT and application wound vac;  Surgeon: Albertine Patricia, DPM;  Location: [facility] ORS;  Service: Podiatry;  Laterality: Right;  . PERIPHERAL VASCULAR BALLOON ANGIOPLASTY Right 04/17/2017   Procedure: PERIPHERAL VASCULAR BALLOON ANGIOPLASTY;  Surgeon: Katha Cabal, MD;  Location: [place] CV LAB;  Service: Cardiovascular;  Laterality: Right;    There were no vitals filed for this visit.  Subjective Assessment - 03/08/18 1432    Subjective   Pt. reports having an MD appointment after this session.    Pertinent History  Pt with an  extensive history since March of this year with falls, infection in her feet, great toe of R foot amputated and has been in a skilled nursing facility for many of those months with recent transition to assistive living facility.  She did not ambulate for many months and recently able to walk short distances with an assistive device, she has had multiple falls. She complains of bilateral hand pain but especially on the right side with reports of difficulty holding items, weakness and stiffness which she reports developed suddenly a few weeks ago, she reports CT scan performed but no evidence of stroke. She does report history of arthritic changes in the hands    Patient Stated Goals  Patient reports she would like to be able to get up and walk, use right hand  for self care.  Be able to write again.    Currently in Pain?  No/denies      Neuro muscular re-education:  Pt. worked on grasping, and sliding coinswith her right handfrom an elevated surface on the tabletop using her 2nd digit to her thumb, followed by her 3rd digit to her thumb. Pt. worked on actively extending her digits between each rep. Pt. worked on isolating her second digit, and pressing coins into a resistive counter slot. Pt. had more difficulty performing tasks today requiring frequent verbal cues, and visual demonstration. Pt. with limited 3rd, and 4th digit extension today.                        OT Education - 03/08/18 1433    Education Details  right hand digit extension, [facility] skills    Person(s) Educated  Patient    Methods  Explanation;Demonstration;Verbal cues    Comprehension  Verbalized understanding;Returned demonstration;Verbal cues required          OT Long Term Goals - 02/22/18 1557      OT LONG TERM GOAL #1   Title  Patient will demonstrate increase in grip strength by 5# to be able to hold utensils in right hand for self feeding.     Baseline  Pt. continues to use her left hand to feed self     Time  6    Period  Weeks    Status  On-going    Target Date  03/24/18      OT LONG TERM GOAL #2   Title  Patient will demonstrate improved coordination to pick up small objects in right hand 1/2 inch in size.     Baseline  Pt. is improving with grasping small objects    Time  12    Period  Weeks    Status  On-going    Target Date  03/24/18      OT LONG TERM GOAL #3   Title  Patient to perform UB dressing with modified independence     Baseline  min assist at eval    Time  6    Period  Weeks    Status  On-going    Target Date  03/24/18      OT LONG TERM GOAL #4   Title  Patient will complete bathing with supervision    Baseline  moderate assist at eval     Period  Weeks    Status  On-going    Target Date  03/24/18      OT LONG  TERM GOAL #5   Title  Patient will demonstrate HEP independently    Baseline  Pt.  requires assist.    Time  12    Period  Weeks    Status  On-going    Target Date  03/24/18      OT LONG TERM GOAL #6   Title  Patient will demonstrate ability to hold pen and write/sign name on important papers with 75% legibility     Baseline  Pt. conitnues to have difficulty holding pen and legiblity less than 25%     Time  12    Period  Weeks    Status  On-going    Target Date  03/24/18            Plan - 03/08/18 1502    Clinical Impression Statement  Pt. reports not feeling well today, and has an MD appointment with Dr. Sabra Heck. Pt.'s son was present during the session. Pt. had more difficulty performing tasks during the treatment session. Pt. continues to work on improving [facility] skillls, as well as 3rd, and 4th digit extension in order to increase engagement in ADLs, and IADLs.    Occupational Profile and client history currently impacting functional performance  History of falls, currently being seen by PT, toe amputation, balance, arthritic changes in bilateral UEs, lives alone but currently in assistive living    Occupational performance deficits (Please refer to evaluation for details):  ADL's;IADL's;Social Participation    Rehab Potential  Good    Current Impairments/barriers affecting progress:  significant weakness in R hand compared to left, balance, fall risk    OT Frequency  2x / week    OT Duration  12 weeks    OT Treatment/Interventions  Self-care/ADL training;Therapeutic exercise;Moist Heat;Neuromuscular education;Splinting;Patient/family education;Therapeutic activities;Balance training;Contrast Bath;DME and/or AE instruction;Manual Therapy    Clinical Decision Making  Several treatment options, min-mod task modification necessary    Consulted and Agree with Plan of Care  Patient       Patient will benefit from skilled therapeutic intervention in order to improve the following  deficits and impairments:  Abnormal gait, Decreased knowledge of use of DME, Impaired flexibility, Pain, Decreased coordination, Decreased mobility, Decreased activity tolerance, Decreased endurance, Decreased range of motion, Decreased strength, Decreased balance, Difficulty walking, Impaired UE functional use  Visit Diagnosis: Muscle weakness (generalized)  Other lack of coordination    Problem List Patient Active Problem List   Diagnosis Date Noted  . Atherosclerosis of native arteries of the extremities with ulceration ([place]) 05/07/2017  . Non-healing ulcer ([place]) 04/16/2017  . Diabetic neuropathy ([place]) 04/07/2017  . Septic shock ([place]) 03/20/2017  . CKD (chronic kidney disease), stage III ([place]) 03/19/2017  . Goals of care, counseling/discussion 03/03/2017  . PAD (peripheral artery disease) ([place]) 02/03/2017  . Abdominal aortic stenosis 02/03/2017  . Bilateral lower extremity edema 02/03/2017  . Anemia 01/29/2017  . B12 deficiency 01/29/2017  . Pancreatic mass 01/29/2017  . Hyponatremia 07/07/2015  . Foot ulcer ([place]) 02/07/2015  . ARF (acute renal failure) ([place]) 11/05/2014  . Diabetic foot infection ([place]) 10/10/2014  . Chronic diastolic heart failure ([place]) 05/31/2014  . HTN (hypertension) 05/31/2014  . DM (diabetes mellitus), type 2, uncontrolled ([place]) 05/31/2014    Harrel Carina, MS, OTR/L 03/08/2018, 3:08 PM  [place] MAIN [facility] SERVICES [address] [place], [place], [address] Phone: [phone]   Fax:  [phone]  Name: Sharon [name] MRN: [id number] Date of Birth: 04/23/1933

## 2018-03-08 NOTE — ED Triage Notes (Signed)
Pt presents to ED with c/o abnormal labs from Dr. Ammie Ferrier office. Per paperwork from Dr. Ammie Ferrier office pt is having SOB and has an elevated creatinine. When asked why patient was brought in pt states "I don't know, what does my paperwork say". Pt was hx of CHF and had bilateral UNA boots placed to lower extremities today.

## 2018-03-09 ENCOUNTER — Observation Stay
Admit: 2018-03-09 | Discharge: 2018-03-09 | Disposition: A | Discharge status: Home / Self Care | Payer: PPO | Attending: Internal Medicine | Admitting: Internal Medicine

## 2018-03-09 ENCOUNTER — Observation Stay: Payer: PPO

## 2018-03-09 ENCOUNTER — Encounter: Payer: Self-pay | Admitting: Surgery

## 2018-03-09 DIAGNOSIS — R600 Localized edema: Secondary | ICD-10-CM | POA: Diagnosis not present

## 2018-03-09 DIAGNOSIS — I5032 Chronic diastolic (congestive) heart failure: Secondary | ICD-10-CM | POA: Diagnosis not present

## 2018-03-09 DIAGNOSIS — N2889 Other specified disorders of kidney and ureter: Secondary | ICD-10-CM | POA: Diagnosis not present

## 2018-03-09 DIAGNOSIS — N179 Acute kidney failure, unspecified: Secondary | ICD-10-CM | POA: Diagnosis not present

## 2018-03-09 DIAGNOSIS — I10 Essential (primary) hypertension: Secondary | ICD-10-CM | POA: Diagnosis not present

## 2018-03-09 LAB — BASIC METABOLIC PANEL
Anion gap: 13 (ref 5–15)
BUN: 73 mg/dL — ABNORMAL HIGH (ref 8–23)
CHLORIDE: 97 mmol/L — AB (ref 98–111)
CO2: 24 mmol/L (ref 22–32)
Calcium: 9 mg/dL (ref 8.9–10.3)
Creatinine, Ser: 2.21 mg/dL — ABNORMAL HIGH (ref 0.44–1.00)
GFR calc Af Amer: 23 mL/min — ABNORMAL LOW (ref >60)
GFR calc non Af Amer: 20 mL/min — ABNORMAL LOW (ref >60)
Glucose, Bld: 275 mg/dL — ABNORMAL HIGH (ref 70–99)
POTASSIUM: 5 mmol/L (ref 3.5–5.1)
Sodium: 134 mmol/L — ABNORMAL LOW (ref 135–145)

## 2018-03-09 LAB — PROTEIN / CREATININE RATIO, URINE
Creatinine, Urine: 34 mg/dL
PROTEIN CREATININE RATIO: 1.79 mg/mg{creat} — AB (ref 0.00–0.15)
TOTAL PROTEIN, URINE: 61 mg/dL

## 2018-03-09 LAB — CBC
HCT: 35.7 % — ABNORMAL LOW (ref 36.0–46.0)
Hemoglobin: 11.8 g/dL — ABNORMAL LOW (ref 12.0–15.0)
MCH: 29.1 pg (ref 26.0–34.0)
MCHC: 33.1 g/dL (ref 30.0–36.0)
MCV: 88.1 fL (ref 80.0–100.0)
NRBC: 0 % (ref 0.0–0.2)
Platelets: 207 10*3/uL (ref 150–400)
RBC: 4.05 MIL/uL (ref 3.87–5.11)
RDW: 13.7 % (ref 11.5–15.5)
WBC: 10.9 10*3/uL — ABNORMAL HIGH (ref 4.0–10.5)

## 2018-03-09 LAB — GLUCOSE, CAPILLARY
Glucose-Capillary: 303 mg/dL — ABNORMAL HIGH (ref 70–99)
Glucose-Capillary: 221 mg/dL — ABNORMAL HIGH (ref 70–99)
Glucose-Capillary: 205 mg/dL — ABNORMAL HIGH (ref 70–99)
Glucose-Capillary: 205 mg/dL — ABNORMAL HIGH (ref 70–99)
Glucose-Capillary: 163 mg/dL — ABNORMAL HIGH (ref 70–99)

## 2018-03-09 LAB — MRSA PCR SCREENING: MRSA BY PCR: POSITIVE — AB

## 2018-03-09 LAB — ECHOCARDIOGRAM COMPLETE
Height: 65 in
Weight: 2480 oz

## 2018-03-09 MED ORDER — ATORVASTATIN CALCIUM 10 MG PO TABS
10.0000 mg | ORAL_TABLET | Freq: Every day | ORAL | Status: DC
  Administered 2018-03-09 – 2018-03-11 (×3): 10 mg via ORAL
  Filled 2018-03-09 (×3): qty 1

## 2018-03-09 MED ORDER — INSULIN ASPART 100 UNIT/ML ~~LOC~~ SOLN: 0.0000 [IU] | Freq: Every day | SUBCUTANEOUS | Status: DC

## 2018-03-09 MED ORDER — CHLORHEXIDINE GLUCONATE CLOTH 2 % EX PADS
6.0000 | MEDICATED_PAD | Freq: Every day | CUTANEOUS | Status: DC
  Administered 2018-03-10 – 2018-03-12 (×3): 6 via TOPICAL

## 2018-03-09 MED ORDER — AMIODARONE HCL 200 MG PO TABS
200.0000 mg | ORAL_TABLET | Freq: Every day | ORAL | Status: DC
  Administered 2018-03-09 – 2018-03-12 (×4): 200 mg via ORAL
  Filled 2018-03-09 (×4): qty 1

## 2018-03-09 MED ORDER — HEPARIN SODIUM (PORCINE) 5000 UNIT/ML IJ SOLN
5000.0000 [IU] | Freq: Three times a day (TID) | INTRAMUSCULAR | Status: DC
  Administered 2018-03-09 – 2018-03-12 (×10): 5000 [IU] via SUBCUTANEOUS
  Filled 2018-03-09 (×10): qty 1

## 2018-03-09 MED ORDER — MUPIROCIN 2 % EX OINT
1.0000 "application " | TOPICAL_OINTMENT | Freq: Two times a day (BID) | CUTANEOUS | Status: DC
  Administered 2018-03-09 – 2018-03-12 (×7): 1 via NASAL
  Filled 2018-03-09: qty 22

## 2018-03-09 MED ORDER — ONDANSETRON HCL 4 MG PO TABS: 4.0000 mg | ORAL_TABLET | Freq: Four times a day (QID) | ORAL | Status: DC | PRN

## 2018-03-09 MED ORDER — ONDANSETRON HCL 4 MG/2ML IJ SOLN: 4.0000 mg | Freq: Four times a day (QID) | INTRAMUSCULAR | Status: DC | PRN

## 2018-03-09 MED ORDER — SENNOSIDES-DOCUSATE SODIUM 8.6-50 MG PO TABS
1.0000 | ORAL_TABLET | Freq: Once | ORAL | Status: AC
  Administered 2018-03-09: 1 via ORAL
  Filled 2018-03-09: qty 1

## 2018-03-09 MED ORDER — ACETAMINOPHEN 650 MG RE SUPP: 650.0000 mg | Freq: Four times a day (QID) | RECTAL | Status: DC | PRN

## 2018-03-09 MED ORDER — ACETAMINOPHEN 325 MG PO TABS
650.0000 mg | ORAL_TABLET | Freq: Four times a day (QID) | ORAL | Status: DC | PRN
  Administered 2018-03-09 – 2018-03-10 (×3): 650 mg via ORAL
  Filled 2018-03-09 (×3): qty 2

## 2018-03-09 MED ORDER — GUAIFENESIN-DM 100-10 MG/5ML PO SYRP
5.0000 mL | ORAL_SOLUTION | ORAL | Status: DC | PRN
  Filled 2018-03-09: qty 5

## 2018-03-09 MED ORDER — INSULIN ASPART 100 UNIT/ML ~~LOC~~ SOLN
0.0000 [IU] | Freq: Three times a day (TID) | SUBCUTANEOUS | Status: DC
  Administered 2018-03-09 (×3): 3 [IU] via SUBCUTANEOUS
  Administered 2018-03-10: 2 [IU] via SUBCUTANEOUS
  Administered 2018-03-10: 3 [IU] via SUBCUTANEOUS
  Administered 2018-03-10: 2 [IU] via SUBCUTANEOUS
  Administered 2018-03-11: 1 [IU] via SUBCUTANEOUS
  Administered 2018-03-11: 3 [IU] via SUBCUTANEOUS
  Administered 2018-03-11: 5 [IU] via SUBCUTANEOUS
  Administered 2018-03-12: 2 [IU] via SUBCUTANEOUS
  Administered 2018-03-12: 3 [IU] via SUBCUTANEOUS
  Filled 2018-03-09 (×11): qty 1

## 2018-03-09 NOTE — Plan of Care (Signed)

## 2018-03-09 NOTE — Care Management Obs Status (Signed)
[place] NOTIFICATION   Patient Details  Name: AINO HECKERT MRN: [id number] Date of Birth: 12-14-1933   Medicare Observation Status Notification Given:  Yes    Elza Rafter, RN 03/09/2018, 4:20 PM

## 2018-03-09 NOTE — H&P (Signed)
[place] [facility] [place] NAME: Sharon [name]    MR#:  [id number]  DATE OF BIRTH:  [birth year]  DATE OF ADMISSION:  03/08/2018  PRIMARY CARE PHYSICIAN: Rusty Aus, MD   REQUESTING/REFERRING PHYSICIAN: Kerman Passey, MD  CHIEF COMPLAINT:   Chief Complaint  Patient presents with  . Abnormal Labs    HISTORY OF PRESENT ILLNESS:  Sharon [name]  is a 85 y.o. female who presents with chief complaint as above.  Patient presents the ED for elevated creatinine.  She has significant lower extremity edema and recently was placed on Lasix.  On laboratory evaluation her creatinine has risen significantly from her baseline.  She is also been having some increased cough and shortness of breath.  She was sent to the ED and on evaluation was found to have some interstitial edema on chest x-ray.  Her creatinine is also up to 2.7, from a baseline around 1-1/2.  Hospitalist were called for admission  PAST MEDICAL HISTORY:   Past Medical History:  Diagnosis Date  . Anemia   . Atrial fibrillation ([place])   . Cardiac arrest ([place])   . Cataract   . CHF (congestive heart failure) ([place])   . Diabetes mellitus without complication ([place])   . Edema    feet/ankles occas  . Hip fracture ([place])   . Hyperlipidemia   . Hypertension   . Hypothyroid   . Neuropathy   . Osteomyelitis ([place])    left first metatarsal  . Stroke ([place])      PAST SURGICAL HISTORY:   Past Surgical History:  Procedure Laterality Date  . ACHILLES TENDON SURGERY Left 02/08/2015   Procedure: ACHILLES LENGTHENING/KIDNER;  Surgeon: Albertine Patricia, DPM;  Location: [facility] ORS;  Service: Podiatry;  Laterality: Left;  . APPENDECTOMY    . CARDIAC CATHETERIZATION  08/25/13  . CATARACT EXTRACTION W/PHACO Left 07/20/2014   Procedure: CATARACT EXTRACTION PHACO AND INTRAOCULAR LENS PLACEMENT (IOC);  Surgeon: Leandrew Koyanagi, MD;  Location: [facility] ORS;  Service: Ophthalmology;  Laterality: Left;  [place]   1:18                 AP     23.6             CDE   9.69      lot #[phone]  . CHOLECYSTECTOMY    . CORONARY ANGIOPLASTY    . HALLUX VALGUS AKIN Left 02/08/2015   Procedure: HALLUX VALGUS AKIN/ KELLER;  Surgeon: Albertine Patricia, DPM;  Location: [facility] ORS;  Service: Podiatry;  Laterality: Left;  IVA with Local needs 1 hour for this case   . HEMIARTHROPLASTY HIP Right   . HEMIARTHROPLASTY HIP Left   . INCISION AND DRAINAGE Left 10/11/2014   Procedure: Removal of infected tibial sessmoid;  Surgeon: Albertine Patricia, DPM;  Location: [facility] ORS;  Service: Podiatry;  Laterality: Left;  . IRRIGATION AND DEBRIDEMENT FOOT Right 03/21/2017   Procedure: IRRIGATION AND DEBRIDEMENT FOOT right great toe amputation;  Surgeon: Sharlotte Alamo, DPM;  Location: [facility] ORS;  Service: Podiatry;  Laterality: Right;  . IRRIGATION AND DEBRIDEMENT FOOT Right 04/18/2017   Procedure: IRRIGATION AND DEBRIDEMENT FOOT and application wound vac;  Surgeon: Albertine Patricia, DPM;  Location: [facility] ORS;  Service: Podiatry;  Laterality: Right;  . PERIPHERAL VASCULAR BALLOON ANGIOPLASTY Right 04/17/2017   Procedure: PERIPHERAL VASCULAR BALLOON ANGIOPLASTY;  Surgeon: Katha Cabal, MD;  Location: [place] CV LAB;  Service: Cardiovascular;  Laterality: Right;     SOCIAL HISTORY:  Social History   Tobacco Use  . Smoking status: Former Smoker    Packs/day: 0.50    Years: 7.00    Pack years: 3.50    Types: Cigarettes    Last attempt to quit: 05/30/1989    Years since quitting: 28.7  . Smokeless tobacco: Never Used  Substance Use Topics  . Alcohol use: No    Alcohol/week: 0.0 standard drinks     FAMILY HISTORY:   Family History  Problem Relation Age of Onset  . Heart attack Mother      DRUG ALLERGIES:   Allergies  Allergen Reactions  . Carbamazepine Other (See Comments)    Reaction: unknown  . Cymbalta [Duloxetine Hcl] Other (See Comments)    Confusion, disorientation  . Duloxetine     Other reaction(s): Other  (See Comments) Confusion, disorientation  . Lyrica [Pregabalin] Other (See Comments)    Patient and son states this makes the patient confused.  . Temazepam     Other reaction(s): Hallucination    MEDICATIONS AT HOME:   Prior to Admission medications   Medication Sig Start Date End Date Taking? Authorizing Provider  acetaminophen (TYLENOL) 500 MG tablet Take 1,000 mg by mouth at bedtime. For pain management   Yes [provider]  amiodarone (PACERONE) 200 MG tablet Take 1 tablet (200 mg total) by mouth daily. 03/24/17  Yes Gladstone Lighter, MD  amitriptyline (ELAVIL) 10 MG tablet Take 10 mg by mouth daily.    Yes [provider]  atorvastatin (LIPITOR) 10 MG tablet Take 10 mg by mouth daily at 10 pm.    Yes [provider]  cholecalciferol (VITAMIN D) 1000 units tablet Take 1,000 Units by mouth daily.   Yes [provider]  ferrous sulfate 325 (65 FE) MG tablet Take 325 mg by mouth daily.    Yes [provider]  gabapentin (NEURONTIN) 100 MG capsule Take 200 mg by mouth 3 (three) times daily.    Yes [provider]  glimepiride (AMARYL) 4 MG tablet Take 4 mg by mouth daily.    Yes [provider]  JANUVIA 50 MG tablet Take 50 mg by mouth daily. Take along with Amaryl 02/15/18  Yes [provider]  levothyroxine (SYNTHROID, LEVOTHROID) 112 MCG tablet Take 112 mcg by mouth daily before breakfast. 30 minutes before breakfast   Yes [provider]  losartan (COZAAR) 25 MG tablet Take 25 mg by mouth daily.    Yes [provider]  metoprolol tartrate (LOPRESSOR) 25 MG tablet Take 1 tablet (25 mg total) by mouth 2 (two) times daily. 04/21/17  Yes Pyreddy, Reatha Harps, MD  spironolactone (ALDACTONE) 25 MG tablet Take 25 mg by mouth daily. For fluid   Yes [provider]  traZODone (DESYREL) 50 MG tablet Take 50 mg by mouth at bedtime. For anxiety 06/24/17 06/24/18 Yes [provider]  vitamin C  (ASCORBIC ACID) 500 MG tablet Take 500 mg by mouth daily.    Yes [provider]  Amino Acids-Protein Hydrolys (FEEDING SUPPLEMENT, PRO-STAT SUGAR FREE 64,) LIQD Take 30 mLs by mouth 2 (two) times daily between meals.    [provider]  bisacodyl (DULCOLAX) 5 MG EC tablet Take 2 tablets (10 mg total) by mouth every other day. 03/25/17   Gladstone Lighter, MD  furosemide (LASIX) 20 MG tablet Take 1 tablet (20 mg total) by mouth daily. Patient taking differently: Take 60 mg by mouth daily.  03/23/17   Gladstone Lighter, MD  [place] [facility]) Yehuda Budd  Take 237 mLs by mouth 2 (two) times daily between meals.    [provider]  Multiple Vitamins-Minerals (PRESERVISION AREDS 2+MULTI VIT PO) Take 1 capsule by mouth 2 (two) times daily.     [provider]  nystatin cream (MYCOSTATIN) Apply 1 application topically 2 (two) times daily.    [provider]  piperacillin-tazobactam (ZOSYN) 3.375 (3-0.375) g injection Inject into the vein.    [provider]  potassium chloride (MICRO-K) 10 MEQ CR capsule Take 10 mEq by mouth daily.    [provider]  senna-docusate (SENOKOT-S) 8.6-50 MG tablet Take 1 tablet by mouth 2 (two) times daily. 03/23/17   Gladstone Lighter, MD  simvastatin (ZOCOR) 20 MG tablet Take 20 mg by mouth daily.    [provider]  Skin Protectants, Misc. (ENDIT EX) Apply 1 application topically 2 (two) times daily.    [provider]  traMADol (ULTRAM) 50 MG tablet Take 1 tablet (50 mg total) by mouth every 6 (six) hours as needed. 04/21/17   Saundra Shelling, MD    REVIEW OF SYSTEMS:  Review of Systems  Constitutional: Negative for chills, fever, malaise/fatigue and weight loss.  HENT: Negative for ear pain, hearing loss and tinnitus.   Eyes: Negative for blurred vision, double vision, pain and redness.  Respiratory: Positive for cough and shortness of breath. Negative for hemoptysis.   Cardiovascular: Positive  for leg swelling. Negative for chest pain, palpitations and orthopnea.  Gastrointestinal: Negative for abdominal pain, constipation, diarrhea, nausea and vomiting.  Genitourinary: Negative for dysuria, frequency and hematuria.  Musculoskeletal: Negative for back pain, joint pain and neck pain.  Skin:       No acne, rash, or lesions  Neurological: Negative for dizziness, tremors, focal weakness and weakness.  Endo/Heme/Allergies: Negative for polydipsia. Does not bruise/bleed easily.  Psychiatric/Behavioral: Negative for depression. The patient is not nervous/anxious and does not have insomnia.      VITAL SIGNS:   Vitals:   03/08/18 1553  BP: (!) 198/57  Pulse: 64  Resp: 18  Temp: 98.4 F (36.9 C)  TempSrc: Oral  SpO2: 95%  Weight: 70.3 kg  Height: 5\' 5"  (1.651 m)   Wt Readings from Last 3 Encounters:  03/08/18 70.3 kg  11/08/17 63 kg  05/07/17 63.5 kg    PHYSICAL EXAMINATION:  Physical Exam  Vitals reviewed. Constitutional: She is oriented to person, place, and time. She appears well-developed and well-nourished. No distress.  HENT:  Head: Normocephalic and atraumatic.  Mouth/Throat: Oropharynx is clear and moist.  Eyes: Pupils are equal, round, and reactive to light. Conjunctivae and EOM are normal. No scleral icterus.  Neck: Normal range of motion. Neck supple. No JVD present. No thyromegaly present.  Cardiovascular: Normal rate, regular rhythm and intact distal pulses. Exam reveals no gallop and no friction rub.  No murmur heard. Respiratory: Effort normal. No respiratory distress. She has no wheezes. She has rales.  GI: Soft. Bowel sounds are normal. She exhibits no distension. There is no abdominal tenderness.  Musculoskeletal: Normal range of motion.        General: Edema present.     Comments: No arthritis, no gout  Lymphadenopathy:    She has no cervical adenopathy.  Neurological: She is alert and oriented to person, place, and time. No cranial nerve deficit.   No dysarthria, no aphasia  Skin: Skin is warm and dry. No rash noted. No erythema.  Psychiatric: She has a normal mood and affect. Her behavior is normal. Judgment  and thought content normal.    LABORATORY PANEL:   CBC Recent Labs  Lab 03/08/18 1556  WBC 11.3*  HGB 12.5  HCT 38.0  PLT 207   ------------------------------------------------------------------------------------------------------------------  Chemistries  Recent Labs  Lab 03/08/18 1556  NA 130*  K 4.6  CL 95*  CO2 22  GLUCOSE 409*  BUN 76*  CREATININE 2.70*  CALCIUM 8.9  AST 51*  ALT 50*  ALKPHOS 87  BILITOT 0.6   ------------------------------------------------------------------------------------------------------------------  Cardiac Enzymes Recent Labs  Lab 03/08/18 1958  TROPONINI <0.03   ------------------------------------------------------------------------------------------------------------------  RADIOLOGY:  Dg Chest 2 View  Result Date: 03/08/2018 CLINICAL DATA:  CHF, shortness of breath EXAM: CHEST - 2 VIEW COMPARISON:  03/23/2017 FINDINGS: Mild cardiomegaly with vascular congestion and chronic appearing interstitial prominence. Query chronic interstitial lung disease. No definite focal pneumonia, collapse or consolidation. No effusion or pneumothorax. Apical pleural thickening noted, worse on the right. Trachea is midline. Aorta atherosclerotic. Degenerative changes noted spine. IMPRESSION: Stable cardiomegaly with vascular congestion and chronic interstitial changes. No superimposed acute process or interval change by plain radiography Electronically Signed   By: Jerilynn Mages.  Shick M.D.   On: 03/08/2018 17:10    EKG:   Orders placed or performed during the hospital encounter of 03/08/18  . ED EKG  . ED EKG    IMPRESSION AND PLAN:  Principal Problem:   Acute on chronic renal failure (HCC) -patient's creatinine only went up to 2.7, but her GFR fell significantly.  We will get a nephrology  consult to assist with further recommendations about how to remove this patient's excess fluid and treat her edema without further injuring her kidneys Active Problems:   Bilateral lower extremity edema -nephrology consult as above   Chronic diastolic heart failure ([place]) -continue home meds   HTN (hypertension) -home dose antihypertensives   DM (diabetes mellitus), type 2, uncontrolled (HCC) -sliding scale insulin coverage   PAD (peripheral artery disease) ([place]) -continue home meds  Chart review performed and case discussed with ED provider. Labs, imaging and/or ECG reviewed by provider and discussed with patient/family. Management plans discussed with the patient and/or family.  DVT PROPHYLAXIS: SubQ heparin  GI PROPHYLAXIS:  None  ADMISSION STATUS: Observation  CODE STATUS: Full Code Status History    Date Active Date Inactive Code Status Order ID Comments User Context   04/16/2017 1705 04/21/2017 2102 Full Code [id number]  Fritzi Mandes, MD Inpatient   03/23/2017 1422 03/23/2017 2233 Full Code [id number]  Gladstone Lighter, MD Inpatient   03/20/2017 1121 03/23/2017 1422 DNR [id number]  Bettey Costa, MD Inpatient   03/20/2017 0140 03/20/2017 1121 Full Code [id number]  Lance Coon, MD ED   07/07/2015 0353 07/09/2015 1520 Full Code [id number]  Saundra Shelling, MD Inpatient   02/07/2015 1605 02/12/2015 1921 Full Code [id number]  Dustin Flock, MD Inpatient   11/05/2014 1551 11/09/2014 1536 Full Code [id number]  Gladstone Lighter, MD Inpatient   10/10/2014 1112 10/17/2014 1750 Full Code [id number]  Epifanio Lesches, MD Inpatient      TOTAL TIME TAKING CARE OF THIS PATIENT: 40 minutes.   Ethlyn Daniels 03/09/2018, 1:04 AM  [facility]  Office  [phone]  CC: Primary care physician; Rusty Aus, MD  Note:  This document was prepared using Dragon voice recognition software and may include unintentional dictation errors.

## 2018-03-09 NOTE — Progress Notes (Signed)
Patient demanding that this nurse takes off her una boots or she is going to rip them off herself.  This RN removed una boots.  LLE noted to have to open areas.  Will apply dressing over areas and consult wound care nurse.

## 2018-03-09 NOTE — Consult Note (Signed)
[facility] CONSULT NOTE    Date: 03/09/2018                  Patient Name:  Sharon [name]  MRN: [id number]  DOB: Dec 24, 1933  Age / Sex: 85 y.o., female         PCP: Rusty Aus, MD                 Service Requesting Consult: Hospitalist                 Reason for Consult: Acute renal failure/CKD stage III            History of Present Illness: Patient is a 85 y.o. female with a PMHx of chronic kidney disease stage IIIbaseline creatinine 1.2, atrial fibrillation, congestive heart failure, diabetes mellitus type 2, hypertension, hyperlipidemia, hypothyroidism, peripheral neuropathy, osteomyelitis, prior history of CVA, who was admitted to [facility] on 03/08/2018 for evaluation of abnormal labs.  It appears that she developed acute renal failure as an outpatient.  She was recently started on Lasix and also had Unna boots applied.  As above her baseline creatinine appears to be 1.2.  Patient quite lethargic on interview and could not provide significant input into her history of present illness today.   Medications: Outpatient medications: Medications Prior to Admission  Medication Sig Dispense Refill Last Dose  . acetaminophen (TYLENOL) 500 MG tablet Take 1,000 mg by mouth at bedtime. For pain management   03/07/2018 at 2100  . acetaminophen (TYLENOL) 650 MG CR tablet Take 650 mg by mouth every 8 (eight) hours as needed. For discomfort   prn at prn  . amiodarone (PACERONE) 200 MG tablet Take 1 tablet (200 mg total) by mouth daily. 30 tablet 2 03/08/2018 at 0900  . amitriptyline (ELAVIL) 10 MG tablet Take 10 mg by mouth daily.    03/08/2018 at 0900  . atorvastatin (LIPITOR) 10 MG tablet Take 10 mg by mouth daily at 10 pm.    03/07/2018 at 2000  . cholecalciferol (VITAMIN D) 1000 units tablet Take 1,000 Units by mouth daily.   03/08/2018 at 0900  . ferrous sulfate 325 (65 FE) MG tablet Take 325 mg by mouth daily.    03/08/2018 at 0900  . furosemide (LASIX) 20 MG tablet  Take 1 tablet (20 mg total) by mouth daily. (Patient taking differently: Take 60 mg by mouth daily. ) 30 tablet 0 03/08/2018 at 0900  . gabapentin (NEURONTIN) 100 MG capsule Take 200 mg by mouth 3 (three) times daily.    03/08/2018 at 1400  . glimepiride (AMARYL) 4 MG tablet Take 4 mg by mouth daily.    03/08/2018 at 0900  . GLUCERNA (GLUCERNA) LIQD Take 237 mLs by mouth 2 (two) times daily between meals.   Taking  . JANUVIA 50 MG tablet Take 50 mg by mouth daily. Take along with Amaryl   03/08/2018 at 0900  . levothyroxine (SYNTHROID, LEVOTHROID) 112 MCG tablet Take 112 mcg by mouth daily before breakfast. 30 minutes before breakfast   03/08/2018 at 0500  . losartan (COZAAR) 25 MG tablet Take 25 mg by mouth daily.    03/08/2018 at 0900  . metoprolol tartrate (LOPRESSOR) 25 MG tablet Take 1 tablet (25 mg total) by mouth 2 (two) times daily. 60 tablet 0 03/08/2018 at 0900  . spironolactone (ALDACTONE) 25 MG tablet Take 25 mg by mouth daily. For fluid   03/08/2018 at 0900  . traZODone (DESYREL) 50 MG tablet Take  50 mg by mouth at bedtime. For anxiety   03/07/2018 at 2000  . vitamin C (ASCORBIC ACID) 500 MG tablet Take 500 mg by mouth daily.    03/08/2018 at 0900  . Amino Acids-Protein Hydrolys (FEEDING SUPPLEMENT, PRO-STAT SUGAR FREE 64,) LIQD Take 30 mLs by mouth 2 (two) times daily between meals.   Taking  . bisacodyl (DULCOLAX) 5 MG EC tablet Take 2 tablets (10 mg total) by mouth every other day. (Patient not taking: Reported on 03/09/2018) 30 tablet 0 Not Taking at Unknown time  . senna-docusate (SENOKOT-S) 8.6-50 MG tablet Take 1 tablet by mouth 2 (two) times daily. (Patient not taking: Reported on 03/09/2018) 30 tablet 2 Not Taking at Unknown time  . traMADol (ULTRAM) 50 MG tablet Take 1 tablet (50 mg total) by mouth every 6 (six) hours as needed. (Patient not taking: Reported on 03/09/2018) 20 tablet 0 Not Taking at Unknown time    Current medications: Current Facility-Administered Medications  Medication  Dose Route Frequency Provider Last Rate Last Dose  . acetaminophen (TYLENOL) tablet 650 mg  650 mg Oral Q6H PRN Lance Coon, MD   650 mg at 03/09/18 7510   Or  . acetaminophen (TYLENOL) suppository 650 mg  650 mg Rectal Q6H PRN Lance Coon, MD      . amiodarone (PACERONE) tablet 200 mg  200 mg Oral Daily Ojie, Jude, MD      . atorvastatin (LIPITOR) tablet 10 mg  10 mg Oral q1800 Ojie, Jude, MD      . Chlorhexidine Gluconate Cloth 2 % PADS 6 each  6 each Topical Q0600 Ojie, Jude, MD      . guaiFENesin-dextromethorphan (ROBITUSSIN DM) 100-10 MG/5ML syrup 5 mL  5 mL Oral Q4H PRN Harrie Foreman, MD      . heparin injection 5,000 Units  5,000 Units Subcutaneous Camelia Phenes Lance Coon, MD   5,000 Units at 03/09/18 [phone]  . insulin aspart (novoLOG) injection 0-5 Units  0-5 Units Subcutaneous QHS Lance Coon, MD      . insulin aspart (novoLOG) injection 0-9 Units  0-9 Units Subcutaneous TID WC Lance Coon, MD   3 Units at 03/09/18 1252  . mupirocin ointment (BACTROBAN) 2 % 1 application  1 application Nasal BID Ojie, Jude, MD      . ondansetron (ZOFRAN) tablet 4 mg  4 mg Oral Q6H PRN Lance Coon, MD       Or  . ondansetron [facility]) injection 4 mg  4 mg Intravenous Q6H PRN Lance Coon, MD       Facility-Administered Medications Ordered in Other Encounters  Medication Dose Route Frequency Provider Last Rate Last Dose  . sodium chloride flush (NS) 0.9 % injection 10 mL  10 mL Intracatheter PRN Karen Kitchens, NP          Allergies: Allergies  Allergen Reactions  . Carbamazepine Other (See Comments)    Reaction: unknown  . Cymbalta [Duloxetine Hcl] Other (See Comments)    Confusion, disorientation  . Duloxetine     Other reaction(s): Other (See Comments) Confusion, disorientation  . Lyrica [Pregabalin] Other (See Comments)    Patient and son states this makes the patient confused.  . Temazepam     Other reaction(s): Hallucination      Past Medical History: Past Medical History:   Diagnosis Date  . Anemia   . Atrial fibrillation ([place])   . Cardiac arrest ([place])   . Cataract   . CHF (congestive heart failure) ([place])   .  Diabetes mellitus without complication ([place])   . Edema    feet/ankles occas  . Hip fracture ([place])   . Hyperlipidemia   . Hypertension   . Hypothyroid   . Neuropathy   . Osteomyelitis ([place])    left first metatarsal  . Stroke [facility])      Past Surgical History: Past Surgical History:  Procedure Laterality Date  . ACHILLES TENDON SURGERY Left 02/08/2015   Procedure: ACHILLES LENGTHENING/KIDNER;  Surgeon: Albertine Patricia, DPM;  Location: [facility] ORS;  Service: Podiatry;  Laterality: Left;  . APPENDECTOMY    . CARDIAC CATHETERIZATION  08/25/13  . CATARACT EXTRACTION W/PHACO Left 07/20/2014   Procedure: CATARACT EXTRACTION PHACO AND INTRAOCULAR LENS PLACEMENT (IOC);  Surgeon: Leandrew Koyanagi, MD;  Location: [facility] ORS;  Service: Ophthalmology;  Laterality: Left;  [place]  1:18                 AP     23.6             CDE   9.69      lot #[phone]  . CHOLECYSTECTOMY    . CORONARY ANGIOPLASTY    . HALLUX VALGUS AKIN Left 02/08/2015   Procedure: HALLUX VALGUS AKIN/ KELLER;  Surgeon: Albertine Patricia, DPM;  Location: [facility] ORS;  Service: Podiatry;  Laterality: Left;  IVA with Local needs 1 hour for this case   . HEMIARTHROPLASTY HIP Right   . HEMIARTHROPLASTY HIP Left   . INCISION AND DRAINAGE Left 10/11/2014   Procedure: Removal of infected tibial sessmoid;  Surgeon: Albertine Patricia, DPM;  Location: [facility] ORS;  Service: Podiatry;  Laterality: Left;  . IRRIGATION AND DEBRIDEMENT FOOT Right 03/21/2017   Procedure: IRRIGATION AND DEBRIDEMENT FOOT right great toe amputation;  Surgeon: Sharlotte Alamo, DPM;  Location: [facility] ORS;  Service: Podiatry;  Laterality: Right;  . IRRIGATION AND DEBRIDEMENT FOOT Right 04/18/2017   Procedure: IRRIGATION AND DEBRIDEMENT FOOT and application wound vac;  Surgeon: Albertine Patricia, DPM;  Location: [facility] ORS;  Service: Podiatry;  Laterality:  Right;  . PERIPHERAL VASCULAR BALLOON ANGIOPLASTY Right 04/17/2017   Procedure: PERIPHERAL VASCULAR BALLOON ANGIOPLASTY;  Surgeon: Katha Cabal, MD;  Location: [facility];  Service: Cardiovascular;  Laterality: Right;     Family History: Family History  Problem Relation Age of Onset  . Heart attack Mother      Social History: Social History   Socioeconomic History  . Marital status: Widowed    Spouse name: Not on file  . Number of children: Not on file  . Years of education: Not on file  . Highest education level: Not on file  Occupational History  . Occupation: retired.  Social Needs  . Financial resource strain: Not hard at all  . Food insecurity:    Worry: Not on file    Inability: Not on file  . Transportation needs:    Medical: Not on file    Non-medical: Not on file  Tobacco Use  . Smoking status: Former Smoker    Packs/day: 0.50    Years: 7.00    Pack years: 3.50    Types: Cigarettes    Last attempt to quit: 05/30/1989    Years since quitting: 28.7  . Smokeless tobacco: Never Used  Substance and Sexual Activity  . Alcohol use: No    Alcohol/week: 0.0 standard drinks  . Drug use: No  . Sexual activity: Not on file  Lifestyle  . Physical activity:    Days per week: Not on file  Minutes per session: Not on file  . Stress: Not on file  Relationships  . Social connections:    Talks on phone: Not on file    Gets together: Not on file    Attends religious service: Not on file    Active member of club or organization: Not on file    Attends meetings of clubs or organizations: Not on file    Relationship status: Not on file  . Intimate partner violence:    Fear of current or ex partner: Not on file    Emotionally abused: Not on file    Physically abused: Not on file    Forced sexual activity: Not on file  Other Topics Concern  . Not on file  Social History Narrative   Lives at home by herself.   Ambulates with a walker     Review of  Systems: Patient cannot provide review systems secondary to lethargy.  Vital Signs: Blood pressure (!) 134/45, pulse 60, temperature 98.2 F (36.8 C), temperature source Oral, resp. rate 14, height 5\' 5"  (1.651 m), weight 70.3 kg, SpO2 93 %.  Weight trends: Filed Weights   03/08/18 1553  Weight: 70.3 kg    Physical Exam: General: NAD, resting in bed  Head: Normocephalic, atraumatic.  Eyes: Anicteric  Nose: Mucous membranes moist, not inflammed, nonerythematous.  Throat: Oropharynx nonerythematous, no exudate appreciated.   Neck: Supple, trachea midline.  Lungs:  Normal respiratory effort. Clear to auscultation BL without crackles or wheezes.  Heart: RRR. S1 and S2 normal without gallop, murmur, or rubs.  Abdomen:  BS normoactive. Soft, Nondistended, non-tender.  No masses or organomegaly.  Extremities: Bilateral UNNA boots on, 1+ b/l LE edema  Neurologic: Lethargic but arousable  Skin: No visible rashes, scars.    Lab results: Basic Metabolic Panel: Recent Labs  Lab 03/08/18 1556 03/09/18 0354  NA 130* 134*  K 4.6 5.0  CL 95* 97*  CO2 22 24  GLUCOSE 409* 275*  BUN 76* 73*  CREATININE 2.70* 2.21*  CALCIUM 8.9 9.0    Liver Function Tests: Recent Labs  Lab 03/08/18 1556  AST 51*  ALT 50*  ALKPHOS 87  BILITOT 0.6  PROT 7.6  ALBUMIN 4.2   No results for input(s): LIPASE, AMYLASE in the last 168 hours. No results for input(s): AMMONIA in the last 168 hours.  CBC: Recent Labs  Lab 03/08/18 1556 03/09/18 0525  WBC 11.3* 10.9*  NEUTROABS 8.1*  --   HGB 12.5 11.8*  HCT 38.0 35.7*  MCV 88.4 88.1  PLT 207 207    Cardiac Enzymes: Recent Labs  Lab 03/08/18 1958  TROPONINI <0.03    BNP: Invalid input(s): POCBNP  CBG: Recent Labs  Lab 03/09/18 0252 03/09/18 0801 03/09/18 1156  GLUCAP 303* 221* 205*    Microbiology: Results for orders placed or performed during the hospital encounter of 03/08/18  MRSA PCR Screening     Status: Abnormal    Collection Time: 03/09/18  5:42 AM  Result Value Ref Range Status   MRSA by PCR POSITIVE (A) NEGATIVE Final    Comment:        The GeneXpert MRSA Assay (FDA approved for NASAL specimens only), is one component of a comprehensive MRSA colonization surveillance program. It is not intended to diagnose MRSA infection nor to guide or monitor treatment for MRSA infections. RESULT CALLED TO, READ BACK BY AND VERIFIED WITH: STEPHANIE SUMMERS 03/09/2018 0818 MU Performed at [facility], [place], [place]  [address]     Coagulation Studies: No results for input(s): LABPROT, INR in the last 72 hours.  Urinalysis: No results for input(s): COLORURINE, LABSPEC, PHURINE, GLUCOSEU, HGBUR, BILIRUBINUR, KETONESUR, PROTEINUR, UROBILINOGEN, NITRITE, LEUKOCYTESUR in the last 72 hours.  Invalid input(s): APPERANCEUR    Imaging: Dg Chest 2 View  Result Date: 03/08/2018 CLINICAL DATA:  CHF, shortness of breath EXAM: CHEST - 2 VIEW COMPARISON:  03/23/2017 FINDINGS: Mild cardiomegaly with vascular congestion and chronic appearing interstitial prominence. Query chronic interstitial lung disease. No definite focal pneumonia, collapse or consolidation. No effusion or pneumothorax. Apical pleural thickening noted, worse on the right. Trachea is midline. Aorta atherosclerotic. Degenerative changes noted spine. IMPRESSION: Stable cardiomegaly with vascular congestion and chronic interstitial changes. No superimposed acute process or interval change by plain radiography Electronically Signed   By: Jerilynn Mages.  Shick M.D.   On: 03/08/2018 17:10      Assessment & Plan: Pt is a 85 y.o. female  with a PMHx of chronic kidney disease stage IIIbaseline creatinine 1.2, atrial fibrillation, congestive heart failure, diabetes mellitus type 2, hypertension, hyperlipidemia, hypothyroidism, peripheral neuropathy, osteomyelitis, prior history of CVA, who was admitted to [facility] on 03/08/2018 for evaluation of  abnormal labs.  1.  Acute renal failure/chronic kidney disease stage III baseline creatinine 1.2.  Suspect that overdiuresis is playing some role in acute renal failure as patient revealed Lasix as well as spironolactone.  Patient also quite lethargic today therefore her by mouth intake is in question.  We will obtain renal ultrasound, SPEP and UPEP for further evaluation.  Agree with IV fluid hydration for now.  Followup serum creatinine tomorrow.  2.  Hyponatremia.  Suspect secondary to volume depletion.  Agree with discontinuation of spironolactone and Lasix.  Followup serum sodium tomorrow.  3.  Thanks for consultation.

## 2018-03-09 NOTE — Progress Notes (Signed)
*  PRELIMINARY RESULTS* Echocardiogram 2D Echocardiogram has been performed.  Sherrie Sport 03/09/2018, 12:03 PM

## 2018-03-09 NOTE — Progress Notes (Signed)
[facility] [place] NAME: Sharon [name]    MR#:  [id number]  DATE OF BIRTH:  [birth year]  SUBJECTIVE:  CHIEF COMPLAINT:   Chief Complaint  Patient presents with  . Abnormal Labs   No new complaint this morning.  Patient resting comfortably at this time. REVIEW OF SYSTEMS:  ROS  Not performed at this time since patient is still very sleepy.  DRUG ALLERGIES:   Allergies  Allergen Reactions  . Carbamazepine Other (See Comments)    Reaction: unknown  . Cymbalta [Duloxetine Hcl] Other (See Comments)    Confusion, disorientation  . Duloxetine     Other reaction(s): Other (See Comments) Confusion, disorientation  . Lyrica [Pregabalin] Other (See Comments)    Patient and son states this makes the patient confused.  . Temazepam     Other reaction(s): Hallucination   VITALS:  Blood pressure (!) 134/45, pulse 60, temperature 98.2 F (36.8 C), temperature source Oral, resp. rate 14, height 5\' 5"  (1.651 m), weight 70.3 kg, SpO2 93 %. PHYSICAL EXAMINATION:  Physical Exam  Constitutional: She appears well-developed and well-nourished.  HENT:  Head: Normocephalic and atraumatic.  Eyes: Pupils are equal, round, and reactive to light. Conjunctivae are normal. Right eye exhibits no discharge.  Neck: Normal range of motion. Neck supple. No tracheal deviation present.  Cardiovascular: Normal rate, regular rhythm and normal heart sounds.  Respiratory: Effort normal and breath sounds normal.  GI: Soft. Bowel sounds are normal.  Musculoskeletal: Normal range of motion.     Comments: Patient has Unna boots to both lower extremity  Neurological:  Patient resting comfortably.  Full neuro examination not done at this time.  Skin: No erythema.    LABORATORY PANEL:  Female CBC Recent Labs  Lab 03/09/18 0525  WBC 10.9*  HGB 11.8*  HCT 35.7*  PLT 207    ------------------------------------------------------------------------------------------------------------------ Chemistries  Recent Labs  Lab 03/08/18 1556 03/09/18 0354  NA 130* 134*  K 4.6 5.0  CL 95* 97*  CO2 22 24  GLUCOSE 409* 275*  BUN 76* 73*  CREATININE 2.70* 2.21*  CALCIUM 8.9 9.0  AST 51*  --   ALT 50*  --   ALKPHOS 87  --   BILITOT 0.6  --    RADIOLOGY:  Dg Chest 2 View  Result Date: 03/08/2018 CLINICAL DATA:  CHF, shortness of breath EXAM: CHEST - 2 VIEW COMPARISON:  03/23/2017 FINDINGS: Mild cardiomegaly with vascular congestion and chronic appearing interstitial prominence. Query chronic interstitial lung disease. No definite focal pneumonia, collapse or consolidation. No effusion or pneumothorax. Apical pleural thickening noted, worse on the right. Trachea is midline. Aorta atherosclerotic. Degenerative changes noted spine. IMPRESSION: Stable cardiomegaly with vascular congestion and chronic interstitial changes. No superimposed acute process or interval change by plain radiography Electronically Signed   By: Jerilynn Mages.  Shick M.D.   On: 03/08/2018 17:10   ASSESSMENT AND PLAN:   1. Acute kidney injury superimposed on chronic kidney disease stage 3 Renal function gradually improving.  Was hydrated overnight.  Being cautious with IV fluids due to underlying CHF. Follow-up on nephrology input.  2.  Chronic diastolic  CHF Stable.  Continue home meds 2D echocardiogram done with ejection fraction of 60 to 65%.  3.  Hypertension Blood pressure was initially low this morning.  Repeat blood pressure improved. Gradually resuming home meds including amiodarone.  4.  Diabetes mellitus type 2. Placed on sliding scale insulin coverage.  Monitor and adjust regimen as needed  5.  Peripheral artery disease Stable.  Continue home meds  DVT prophylaxis; heparin   All the records are reviewed and case discussed with Care Management/Social Worker. Management plans discussed  with the patient, family and they are in agreement.  CODE STATUS: Full Code  TOTAL TIME TAKING CARE OF THIS PATIENT: 25 minutes.   More than 50% of the time was spent in counseling/coordination of care: YES  POSSIBLE D/C IN 2 DAYS, DEPENDING ON CLINICAL CONDITION.   Kinsler Soeder M.D on 03/09/2018 at 2:23 PM  Between 7am to 6pm - Pager - [phone]  After 6pm go to [email or web address] - Proofreader  [facility]  Office  [phone]  CC: Primary care physician; Rusty Aus, MD  Note: This dictation was prepared with Dragon dictation along with smaller phrase technology. Any transcriptional errors that result from this process are unintentional.

## 2018-03-10 ENCOUNTER — Ambulatory Visit: Payer: PPO | Admitting: Occupational Therapy

## 2018-03-10 ENCOUNTER — Ambulatory Visit: Payer: PPO | Admitting: Physical Therapy

## 2018-03-10 DIAGNOSIS — Z8249 Family history of ischemic heart disease and other diseases of the circulatory system: Secondary | ICD-10-CM | POA: Diagnosis not present

## 2018-03-10 DIAGNOSIS — Z8673 Personal history of transient ischemic attack (TIA), and cerebral infarction without residual deficits: Secondary | ICD-10-CM | POA: Diagnosis not present

## 2018-03-10 DIAGNOSIS — I70239 Atherosclerosis of native arteries of right leg with ulceration of unspecified site: Secondary | ICD-10-CM | POA: Diagnosis present

## 2018-03-10 DIAGNOSIS — Z7984 Long term (current) use of oral hypoglycemic drugs: Secondary | ICD-10-CM | POA: Diagnosis not present

## 2018-03-10 DIAGNOSIS — Z87891 Personal history of nicotine dependence: Secondary | ICD-10-CM | POA: Diagnosis not present

## 2018-03-10 DIAGNOSIS — Z79890 Hormone replacement therapy (postmenopausal): Secondary | ICD-10-CM | POA: Diagnosis not present

## 2018-03-10 DIAGNOSIS — E871 Hypo-osmolality and hyponatremia: Secondary | ICD-10-CM | POA: Diagnosis not present

## 2018-03-10 DIAGNOSIS — I4891 Unspecified atrial fibrillation: Secondary | ICD-10-CM | POA: Diagnosis present

## 2018-03-10 DIAGNOSIS — E1122 Type 2 diabetes mellitus with diabetic chronic kidney disease: Secondary | ICD-10-CM | POA: Diagnosis present

## 2018-03-10 DIAGNOSIS — E785 Hyperlipidemia, unspecified: Secondary | ICD-10-CM | POA: Diagnosis present

## 2018-03-10 DIAGNOSIS — Z888 Allergy status to other drugs, medicaments and biological substances status: Secondary | ICD-10-CM | POA: Diagnosis not present

## 2018-03-10 DIAGNOSIS — Z8674 Personal history of sudden cardiac arrest: Secondary | ICD-10-CM | POA: Diagnosis not present

## 2018-03-10 DIAGNOSIS — I70249 Atherosclerosis of native arteries of left leg with ulceration of unspecified site: Secondary | ICD-10-CM | POA: Diagnosis present

## 2018-03-10 DIAGNOSIS — N183 Chronic kidney disease, stage 3 (moderate): Secondary | ICD-10-CM | POA: Diagnosis present

## 2018-03-10 DIAGNOSIS — E1165 Type 2 diabetes mellitus with hyperglycemia: Secondary | ICD-10-CM | POA: Diagnosis present

## 2018-03-10 DIAGNOSIS — N289 Disorder of kidney and ureter, unspecified: Secondary | ICD-10-CM | POA: Diagnosis present

## 2018-03-10 DIAGNOSIS — E1142 Type 2 diabetes mellitus with diabetic polyneuropathy: Secondary | ICD-10-CM | POA: Diagnosis present

## 2018-03-10 DIAGNOSIS — I130 Hypertensive heart and chronic kidney disease with heart failure and stage 1 through stage 4 chronic kidney disease, or unspecified chronic kidney disease: Secondary | ICD-10-CM | POA: Diagnosis present

## 2018-03-10 DIAGNOSIS — T502X5A Adverse effect of carbonic-anhydrase inhibitors, benzothiadiazides and other diuretics, initial encounter: Secondary | ICD-10-CM | POA: Diagnosis present

## 2018-03-10 DIAGNOSIS — E039 Hypothyroidism, unspecified: Secondary | ICD-10-CM | POA: Diagnosis present

## 2018-03-10 DIAGNOSIS — N179 Acute kidney failure, unspecified: Secondary | ICD-10-CM | POA: Diagnosis present

## 2018-03-10 DIAGNOSIS — I5032 Chronic diastolic (congestive) heart failure: Secondary | ICD-10-CM | POA: Diagnosis present

## 2018-03-10 DIAGNOSIS — E1151 Type 2 diabetes mellitus with diabetic peripheral angiopathy without gangrene: Secondary | ICD-10-CM | POA: Diagnosis present

## 2018-03-10 LAB — GLUCOSE, CAPILLARY
Glucose-Capillary: 166 mg/dL — ABNORMAL HIGH (ref 70–99)
Glucose-Capillary: 190 mg/dL — ABNORMAL HIGH (ref 70–99)
Glucose-Capillary: 237 mg/dL — ABNORMAL HIGH (ref 70–99)
Glucose-Capillary: 178 mg/dL — ABNORMAL HIGH (ref 70–99)

## 2018-03-10 LAB — PROTEIN ELECTRO, RANDOM URINE
ALPHA-1-GLOBULIN, U: 1.1 %
Albumin ELP, Urine: 69 %
Alpha-2-Globulin, U: 3.7 %
Beta Globulin, U: 14.9 %
Gamma Globulin, U: 11.3 %
Total Protein, Urine: 55 mg/dL

## 2018-03-10 LAB — BASIC METABOLIC PANEL
Anion gap: 9 (ref 5–15)
BUN: 69 mg/dL — ABNORMAL HIGH (ref 8–23)
CO2: 27 mmol/L (ref 22–32)
Calcium: 8.7 mg/dL — ABNORMAL LOW (ref 8.9–10.3)
Chloride: 100 mmol/L (ref 98–111)
Creatinine, Ser: 1.88 mg/dL — ABNORMAL HIGH (ref 0.44–1.00)
GFR calc Af Amer: 28 mL/min — ABNORMAL LOW (ref >60)
GFR calc non Af Amer: 24 mL/min — ABNORMAL LOW (ref >60)
Glucose, Bld: 175 mg/dL — ABNORMAL HIGH (ref 70–99)
Potassium: 4.1 mmol/L (ref 3.5–5.1)
SODIUM: 136 mmol/L (ref 135–145)

## 2018-03-10 LAB — CBC
HCT: 33.1 % — ABNORMAL LOW (ref 36.0–46.0)
Hemoglobin: 10.7 g/dL — ABNORMAL LOW (ref 12.0–15.0)
MCH: 29.1 pg (ref 26.0–34.0)
MCHC: 32.3 g/dL (ref 30.0–36.0)
MCV: 89.9 fL (ref 80.0–100.0)
Platelets: 204 10*3/uL (ref 150–400)
RBC: 3.68 MIL/uL — ABNORMAL LOW (ref 3.87–5.11)
RDW: 13.5 % (ref 11.5–15.5)
WBC: 9.3 10*3/uL (ref 4.0–10.5)
nRBC: 0 % (ref 0.0–0.2)

## 2018-03-10 LAB — PHOSPHORUS: Phosphorus: 3.3 mg/dL (ref 2.5–4.6)

## 2018-03-10 LAB — MAGNESIUM: Magnesium: 2.1 mg/dL (ref 1.7–2.4)

## 2018-03-10 LAB — ANA W/REFLEX IF POSITIVE: Anti Nuclear Antibody(ANA): NEGATIVE

## 2018-03-10 MED ORDER — TRAZODONE HCL 50 MG PO TABS
50.0000 mg | ORAL_TABLET | Freq: Every evening | ORAL | Status: DC | PRN
  Administered 2018-03-10 – 2018-03-11 (×2): 50 mg via ORAL
  Filled 2018-03-10 (×2): qty 1

## 2018-03-10 MED ORDER — SENNOSIDES-DOCUSATE SODIUM 8.6-50 MG PO TABS
1.0000 | ORAL_TABLET | Freq: Two times a day (BID) | ORAL | Status: DC
  Administered 2018-03-11: 1 via ORAL
  Filled 2018-03-10 (×4): qty 1

## 2018-03-10 MED ORDER — COLLAGENASE 250 UNIT/GM EX OINT
TOPICAL_OINTMENT | Freq: Every day | CUTANEOUS | Status: DC
  Administered 2018-03-11 – 2018-03-12 (×2): via TOPICAL
  Filled 2018-03-10: qty 30

## 2018-03-10 MED ORDER — AMITRIPTYLINE HCL 10 MG PO TABS
10.0000 mg | ORAL_TABLET | Freq: Every day | ORAL | Status: DC
  Administered 2018-03-10 – 2018-03-12 (×3): 10 mg via ORAL
  Filled 2018-03-10 (×4): qty 1

## 2018-03-10 MED ORDER — OXYCODONE-ACETAMINOPHEN 5-325 MG PO TABS
1.0000 | ORAL_TABLET | Freq: Four times a day (QID) | ORAL | Status: DC | PRN
  Administered 2018-03-10 – 2018-03-11 (×4): 1 via ORAL
  Filled 2018-03-10 (×4): qty 1

## 2018-03-10 MED ORDER — SODIUM CHLORIDE 0.9 % IV SOLN
INTRAVENOUS | Status: DC
  Administered 2018-03-10 – 2018-03-11 (×3): via INTRAVENOUS

## 2018-03-10 NOTE — Plan of Care (Signed)
  Problem: Education: Goal: Knowledge of General Education information will improve Description Including pain rating scale, medication(s)/side effects and non-pharmacologic comfort measures Outcome: Progressing Note:  Patient alert and oriented but frequently forgetful. Needs re-oriented often. Also has been tearful today. Administered Elavil. Will continue to monitor. Wenda Low [facility]

## 2018-03-10 NOTE — Consult Note (Signed)
[facility] Nurse wound consult note Reason for Consult:Unna boots have been removed per patient request.  We will implement topical treatment for nonhealing chronic wounds.  Has no edema.  Will begin daily wound care to promote autolysis of slough to wound bed.  Wound type: Chronic nonhealing trauma wounds.  Patient in SNF and does not recall her treatment for the wounds as the treatment nurse performs.  Pressure Injury POA: NA Measurement: Proximal:  Left anterior lower leg:  3 cm x 2.4 cm slough to wound bed. Distal wound:  1 cm x 0.5 cm slough to wound bed Wound VEH:MCNOBS Drainage (amount, consistency, odor) Minimal purulence noted Periwound:intact  No edema or erythema noted Dressing procedure/placement/frequency: Cleanse wounds to left leg with NS and pat dry.  Apply Santyl to wound beds.  Cover with NS moist 2x2 and cover with gauze and kerlix.  Change daily.  No compression at this time.  Will not follow at this time.  Please re-consult if needed.  Domenic Moras MSN, RN, FNP-BC CWON Wound, Ostomy, Continence Nurse Pager [phone]

## 2018-03-10 NOTE — Progress Notes (Signed)
Wound care completed on left lower extremity per wound care orders. Santyl applied to wound beds and covered with NS gauze, covered and wrapped with kerlix.

## 2018-03-10 NOTE — Plan of Care (Signed)
Patient is forgetful, and anxious. Patient has had multiple requests from staff. Patient is tearful at times and very inpatient.  Patient has complained of bilateral leg pain. Patient has stated no one has been in her room for hours. Staff has been in to change, assess, reposition and medicated patient since 2000. Will continue to monitor.

## 2018-03-10 NOTE — Progress Notes (Signed)
[facility]  ROUNDING NOTE   Subjective:  Renal function improved as creatinine is down to 1.8. Patient much more awake and alert today.   Objective:  Vital signs in last 24 hours:  Temp:  [97.8 F (36.6 C)-99.5 F (37.5 C)] 97.8 F (36.6 C) (02/26 0734) Pulse Rate:  [68-88] 68 (02/26 0734) Resp:  [14-18] 14 (02/26 0414) BP: (133-185)/(39-61) 135/39 (02/26 0734) SpO2:  [90 %-97 %] 90 % (02/26 0734) Weight:  [66.3 kg] 66.3 kg (02/26 0414)  Weight change: -4.008 kg Filed Weights   03/08/18 1553 03/10/18 0414  Weight: 70.3 kg 66.3 kg    Intake/Output: I/O last 3 completed shifts: In: 290.1 [P.O.:120; I.V.:170.1] Out: 800 [Urine:800]   Intake/Output this shift:  No intake/output data recorded.  Physical Exam: General: No acute distress  Head: Normocephalic, atraumatic. Moist oral mucosal membranes  Eyes: Anicteric  Neck: Supple, trachea midline  Lungs:  Clear to auscultation, normal effort  Heart: S1S2 no rubs  Abdomen:  Soft, nontender, bowel sounds present  Extremities: Trace peripheral edema.  Neurologic: Awake, alert, following commands  Skin: No lesions       Basic Metabolic Panel: Recent Labs  Lab 03/08/18 1556 03/09/18 0354 03/10/18 0517  NA 130* 134* 136  K 4.6 5.0 4.1  CL 95* 97* 100  CO2 22 24 27   GLUCOSE 409* 275* 175*  BUN 76* 73* 69*  CREATININE 2.70* 2.21* 1.88*  CALCIUM 8.9 9.0 8.7*  MG  --   --  2.1  PHOS  --   --  3.3    Liver Function Tests: Recent Labs  Lab 03/08/18 1556  AST 51*  ALT 50*  ALKPHOS 87  BILITOT 0.6  PROT 7.6  ALBUMIN 4.2   No results for input(s): LIPASE, AMYLASE in the last 168 hours. No results for input(s): AMMONIA in the last 168 hours.  CBC: Recent Labs  Lab 03/08/18 1556 03/09/18 0525 03/10/18 0517  WBC 11.3* 10.9* 9.3  NEUTROABS 8.1*  --   --   HGB 12.5 11.8* 10.7*  HCT 38.0 35.7* 33.1*  MCV 88.4 88.1 89.9  PLT 207 207 204    Cardiac Enzymes: Recent Labs  Lab  03/08/18 1958  TROPONINI <0.03    BNP: Invalid input(s): POCBNP  CBG: Recent Labs  Lab 03/09/18 1156 03/09/18 1650 03/09/18 2104 03/10/18 0737 03/10/18 1148  GLUCAP 205* 205* 163* 166* 190*    Microbiology: Results for orders placed or performed during the hospital encounter of 03/08/18  MRSA PCR Screening     Status: Abnormal   Collection Time: 03/09/18  5:42 AM  Result Value Ref Range Status   MRSA by PCR POSITIVE (A) NEGATIVE Final    Comment:        The GeneXpert MRSA Assay (FDA approved for NASAL specimens only), is one component of a comprehensive MRSA colonization surveillance program. It is not intended to diagnose MRSA infection nor to guide or monitor treatment for MRSA infections. RESULT CALLED TO, READ BACK BY AND VERIFIED WITH: STEPHANIE SUMMERS 03/09/2018 0818 MU Performed at [facility], [address] [place], [place] [address]     Coagulation Studies: No results for input(s): LABPROT, INR in the last 72 hours.  Urinalysis: No results for input(s): COLORURINE, LABSPEC, PHURINE, GLUCOSEU, HGBUR, BILIRUBINUR, KETONESUR, PROTEINUR, UROBILINOGEN, NITRITE, LEUKOCYTESUR in the last 72 hours.  Invalid input(s): APPERANCEUR    Imaging: Dg Chest 2 View  Result Date: 03/08/2018 CLINICAL DATA:  CHF, shortness of breath EXAM: CHEST -  2 VIEW COMPARISON:  03/23/2017 FINDINGS: Mild cardiomegaly with vascular congestion and chronic appearing interstitial prominence. Query chronic interstitial lung disease. No definite focal pneumonia, collapse or consolidation. No effusion or pneumothorax. Apical pleural thickening noted, worse on the right. Trachea is midline. Aorta atherosclerotic. Degenerative changes noted spine. IMPRESSION: Stable cardiomegaly with vascular congestion and chronic interstitial changes. No superimposed acute process or interval change by plain radiography Electronically Signed   By: Jerilynn Mages.  Shick M.D.   On: 03/08/2018 17:10   US  Renal  Result Date: 03/09/2018 CLINICAL DATA:  Initial evaluation for acute renal failure. EXAM: RENAL / URINARY TRACT ULTRASOUND COMPLETE COMPARISON:  Prior CT from 04/17/2017. FINDINGS: Right Kidney: Renal measurements: 11.0 x 4.2 x 3.9 cm = volume: 94.1 mL. Diffusely increased echogenicity within the renal parenchyma, compatible with medical renal disease. No hydronephrosis. No discrete mass. 10 mm shadowing echogenic lesion within the lower pole, possibly reflecting a nonobstructive stone. Left Kidney: Renal measurements: 9.0 x 3.9 x 3.7 cm = volume: 68.4 mL. Diffusely increased echogenicity within the renal parenchyma, suggesting medical renal disease. No hydronephrosis. Chronic cortical thinning noted, most notable at the lower pole. No definite focal lesion identified, although examination somewhat technically limited with poor visualization of the left kidney. Bladder: Appears normal for degree of bladder distention. IMPRESSION: 1. Increased echogenicity within the renal parenchyma bilaterally, compatible with medical renal disease. 2. No hydronephrosis. 3. Possible 10 mm nonobstructive right renal nephrolithiasis. Electronically Signed   By: Jeannine Boga M.D.   On: 03/09/2018 16:21     Medications:    . amiodarone  200 mg Oral Daily  . amitriptyline  10 mg Oral Daily  . atorvastatin  10 mg Oral q1800  . Chlorhexidine Gluconate Cloth  6 each Topical Q0600  . collagenase   Topical Daily  . heparin  5,000 Units Subcutaneous Q8H  . insulin aspart  0-5 Units Subcutaneous QHS  . insulin aspart  0-9 Units Subcutaneous TID WC  . mupirocin ointment  1 application Nasal BID   acetaminophen **OR** acetaminophen, guaiFENesin-dextromethorphan, ondansetron **OR** ondansetron (ZOFRAN) IV, oxyCODONE-acetaminophen  Assessment/ Plan:  85 y.o. female with a PMHx of chronic kidney disease stage IIIbaseline creatinine 1.2, atrial fibrillation, congestive heart failure, diabetes mellitus type 2,  hypertension, hyperlipidemia, hypothyroidism, peripheral neuropathy, osteomyelitis, prior history of CVA, who was admitted to [facility] on 03/08/2018 for evaluation of abnormal labs.  1.  Acute renal failure/chronic kidney disease stage III baseline creatinine 1.2.  Suspect that overdiuresis is playing some role in acute renal failure as patient was on Lasix as well as spironolactone.   -Renal function has improved however patient off of IV fluids.  Her EF was found to be 60 to 65% predicted therefore we will restart the patient on 9 normal saline at 50 cc/h.  Follow-up renal parameters tomorrow.  2.  Hyponatremia.    Sodium currently up to 136.  Continue IV fluid hydration with 0.9 normal saline.   LOS: 0 Sharon [name] 2/26/202011:57 AM

## 2018-03-10 NOTE — Progress Notes (Signed)
[place] [facility] [place] NAME: Sharon [name]    MR#:  [id number]  DATE OF BIRTH:  23-Mar-1933  SUBJECTIVE:  CHIEF COMPLAINT:   Chief Complaint  Patient presents with  . Abnormal Labs   Patient was more awake and alert this morning.  Complaining of intermittent left lower extremity pain.  Placed on PRN Percocet.  Intermittently shaking the left lower extremity.  Awake and alert with no evidence of seizure-like activity.  Resumed home dose of amitriptyline.  REVIEW OF SYSTEMS:  Review of Systems  Constitutional: Negative for chills and fever.  HENT: Negative for hearing loss and tinnitus.   Eyes: Negative for blurred vision.  Respiratory: Negative for cough and hemoptysis.   Cardiovascular: Negative for chest pain and palpitations.  Gastrointestinal: Negative for heartburn and nausea.  Genitourinary: Negative for dysuria and urgency.  Musculoskeletal: Negative for neck pain.       Left lower extremity pain.  Intermittently moving left lower extremity due to pains.  Skin: Negative for itching and rash.  Neurological: Negative for dizziness and headaches.  Psychiatric/Behavioral: Negative for depression and substance abuse.      DRUG ALLERGIES:   Allergies  Allergen Reactions  . Carbamazepine Other (See Comments)    Reaction: unknown  . Cymbalta [Duloxetine Hcl] Other (See Comments)    Confusion, disorientation  . Duloxetine     Other reaction(s): Other (See Comments) Confusion, disorientation  . Lyrica [Pregabalin] Other (See Comments)    Patient and son states this makes the patient confused.  . Temazepam     Other reaction(s): Hallucination   VITALS:  Blood pressure (!) 135/39, pulse 68, temperature 97.8 F (36.6 C), temperature source Oral, resp. rate 14, height 5\' 5"  (1.651 m), weight 66.3 kg, SpO2 90 %. PHYSICAL EXAMINATION:  Physical Exam  Constitutional: She appears well-developed and well-nourished.  HENT:  Head:  Normocephalic and atraumatic.  Eyes: Pupils are equal, round, and reactive to light. Conjunctivae are normal. Right eye exhibits no discharge.  Neck: Normal range of motion. Neck supple. No tracheal deviation present.  Cardiovascular: Normal rate, regular rhythm and normal heart sounds.  Respiratory: Effort normal and breath sounds normal.  GI: Soft. Bowel sounds are normal.  Musculoskeletal: Normal range of motion.     Comments: Dressing in place to left lower extremity.  Neurological:  Patient resting comfortably.  Full neuro examination not done at this time.  Skin: No erythema.    LABORATORY PANEL:  Female CBC Recent Labs  Lab 03/10/18 0517  WBC 9.3  HGB 10.7*  HCT 33.1*  PLT 204   ------------------------------------------------------------------------------------------------------------------ Chemistries  Recent Labs  Lab 03/08/18 1556  03/10/18 0517  NA 130*   < > 136  K 4.6   < > 4.1  CL 95*   < > 100  CO2 22   < > 27  GLUCOSE 409*   < > 175*  BUN 76*   < > 69*  CREATININE 2.70*   < > 1.88*  CALCIUM 8.9   < > 8.7*  MG  --   --  2.1  AST 51*  --   --   ALT 50*  --   --   ALKPHOS 87  --   --   BILITOT 0.6  --   --    < > = values in this interval not displayed.   RADIOLOGY:  US Renal  Result Date: 03/09/2018 CLINICAL DATA:  Initial evaluation for acute renal failure.  EXAM: RENAL / URINARY TRACT ULTRASOUND COMPLETE COMPARISON:  Prior CT from 04/17/2017. FINDINGS: Right Kidney: Renal measurements: 11.0 x 4.2 x 3.9 cm = volume: 94.1 mL. Diffusely increased echogenicity within the renal parenchyma, compatible with medical renal disease. No hydronephrosis. No discrete mass. 10 mm shadowing echogenic lesion within the lower pole, possibly reflecting a nonobstructive stone. Left Kidney: Renal measurements: 9.0 x 3.9 x 3.7 cm = volume: 68.4 mL. Diffusely increased echogenicity within the renal parenchyma, suggesting medical renal disease. No hydronephrosis. Chronic  cortical thinning noted, most notable at the lower pole. No definite focal lesion identified, although examination somewhat technically limited with poor visualization of the left kidney. Bladder: Appears normal for degree of bladder distention. IMPRESSION: 1. Increased echogenicity within the renal parenchyma bilaterally, compatible with medical renal disease. 2. No hydronephrosis. 3. Possible 10 mm nonobstructive right renal nephrolithiasis. Electronically Signed   By: Jeannine Boga M.D.   On: 03/09/2018 16:21   ASSESSMENT AND PLAN:   1. Acute kidney injury superimposed on chronic kidney disease stage 3 Renal function continues to improve Seen by nephrologist.  Appreciate input.  Renal ultrasound done with findings consistent with medical renal disease.  No hydronephrosis.  Possible 10 mm nonobstructing right renal nephrolithiasis. Follow-up on renal function in a.m.  2.  Chronic diastolic  CHF Stable.  Continue home meds 2D echocardiogram done with ejection fraction of 60 to 65%.  3.  Hypertension Blood pressure was initially low this morning.  Repeat blood pressure improved. Gradually resuming home meds including amiodarone.  4.  Diabetes mellitus type 2. Placed on sliding scale insulin coverage.  Monitor and adjust regimen as needed  5.  Peripheral artery disease Stable.  Continue home meds  6. Chronic nonhealing trauma wounds.  Pressure Injury POA: NA Measurement: Proximal:  Left anterior lower leg:  3 cm x 2.4 cm slough to wound bed. Distal wound:  1 cm x 0.5 cm slough to wound bed Recommendation is to cleanse wounds to left leg with NS and pat dry.  Apply Santyl to wound beds.  Cover with NS moist 2x2 and cover with gauze and kerlix.  Change daily.  No compression at this time.  Complains of intermittent left lower extremity pain.  Placed on PRN Percocet.  DVT prophylaxis; heparin   All the records are reviewed and case discussed with Care Management/Social  Worker. Management plans discussed with the patient, family and they are in agreement.  CODE STATUS: Full Code  TOTAL TIME TAKING CARE OF THIS PATIENT: 27 minutes.   More than 50% of the time was spent in counseling/coordination of care: YES  POSSIBLE D/C IN 1-2 DAYS, DEPENDING ON CLINICAL CONDITION.   Geovonni Meyerhoff M.D on 03/10/2018 at 2:24 PM  Between 7am to 6pm - Pager - [phone]  After 6pm go to [email or web address] - Proofreader  [facility]  Office  [phone]  CC: Primary care physician; Rusty Aus, MD  Note: This dictation was prepared with Dragon dictation along with smaller phrase technology. Any transcriptional errors that result from this process are unintentional.

## 2018-03-11 LAB — BASIC METABOLIC PANEL
Anion gap: 11 (ref 5–15)
BUN: 48 mg/dL — ABNORMAL HIGH (ref 8–23)
CO2: 23 mmol/L (ref 22–32)
Calcium: 8.6 mg/dL — ABNORMAL LOW (ref 8.9–10.3)
Chloride: 102 mmol/L (ref 98–111)
Creatinine, Ser: 1.51 mg/dL — ABNORMAL HIGH (ref 0.44–1.00)
GFR calc Af Amer: 37 mL/min — ABNORMAL LOW (ref >60)
GFR calc non Af Amer: 32 mL/min — ABNORMAL LOW (ref >60)
Glucose, Bld: 173 mg/dL — ABNORMAL HIGH (ref 70–99)
Potassium: 4.2 mmol/L (ref 3.5–5.1)
Sodium: 136 mmol/L (ref 135–145)

## 2018-03-11 LAB — PROTEIN ELECTROPHORESIS, SERUM
A/G Ratio: 1.1 (ref 0.7–1.7)
Albumin ELP: 3.3 g/dL (ref 2.9–4.4)
Alpha-1-Globulin: 0.2 g/dL (ref 0.0–0.4)
Alpha-2-Globulin: 1.2 g/dL — ABNORMAL HIGH (ref 0.4–1.0)
Beta Globulin: 0.9 g/dL (ref 0.7–1.3)
GAMMA GLOBULIN: 0.8 g/dL (ref 0.4–1.8)
Globulin, Total: 3.1 g/dL (ref 2.2–3.9)
Total Protein ELP: 6.4 g/dL (ref 6.0–8.5)

## 2018-03-11 LAB — GLUCOSE, CAPILLARY
GLUCOSE-CAPILLARY: 191 mg/dL — AB (ref 70–99)
Glucose-Capillary: 209 mg/dL — ABNORMAL HIGH (ref 70–99)
Glucose-Capillary: 281 mg/dL — ABNORMAL HIGH (ref 70–99)
Glucose-Capillary: 144 mg/dL — ABNORMAL HIGH (ref 70–99)

## 2018-03-11 LAB — MAGNESIUM: Magnesium: 2 mg/dL (ref 1.7–2.4)

## 2018-03-11 NOTE — Progress Notes (Signed)
Sharon [name] NAME: Sharon [name]    MR#:  [id number]  DATE OF BIRTH:  Feb 08, 1933  SUBJECTIVE:  CHIEF COMPLAINT:   Chief Complaint  Patient presents with  . Abnormal Labs   Patient denies any complaint this morning resting comfortably.  REVIEW OF SYSTEMS:  Review of Systems  Constitutional: Negative for chills and fever.  HENT: Negative for hearing loss and tinnitus.   Eyes: Negative for blurred vision.  Respiratory: Negative for cough and hemoptysis.   Cardiovascular: Negative for chest pain and palpitations.  Gastrointestinal: Negative for heartburn and nausea.  Genitourinary: Negative for dysuria and urgency.  Musculoskeletal: Negative for neck pain.       Left lower extremity pain.  Intermittently moving left lower extremity due to pains.  Skin: Negative for itching and rash.  Neurological: Negative for dizziness and headaches.  Psychiatric/Behavioral: Negative for depression and substance abuse.      DRUG ALLERGIES:   Allergies  Allergen Reactions  . Carbamazepine Other (See Comments)    Reaction: unknown  . Cymbalta [Duloxetine Hcl] Other (See Comments)    Confusion, disorientation  . Duloxetine     Other reaction(s): Other (See Comments) Confusion, disorientation  . Lyrica [Pregabalin] Other (See Comments)    Patient and son states this makes the patient confused.  . Temazepam     Other reaction(s): Hallucination   VITALS:  Blood pressure 124/74, pulse 91, temperature 98 F (36.7 C), temperature source Oral, resp. rate 14, height 5\' 5"  (1.651 m), weight 65.6 kg, SpO2 96 %. PHYSICAL EXAMINATION:  Physical Exam  Constitutional: She is oriented to person, place, and time. She appears well-developed and well-nourished.  HENT:  Head: Normocephalic and atraumatic.  Eyes: Pupils are equal, round, and reactive to light. Conjunctivae are normal. Right eye exhibits no discharge.  Neck: Normal range of motion.  Neck supple. No tracheal deviation present.  Cardiovascular: Normal rate, regular rhythm and normal heart sounds.  Respiratory: Effort normal and breath sounds normal.  GI: Soft. Bowel sounds are normal.  Musculoskeletal: Normal range of motion.     Comments: Dressing in place to left lower extremity.  Neurological: She is alert and oriented to person, place, and time.  Skin: No erythema.    LABORATORY PANEL:  Female CBC Recent Labs  Lab 03/10/18 0517  WBC 9.3  HGB 10.7*  HCT 33.1*  PLT 204   ------------------------------------------------------------------------------------------------------------------ Chemistries  Recent Labs  Lab 03/08/18 1556  03/11/18 0514  NA 130*   < > 136  K 4.6   < > 4.2  CL 95*   < > 102  CO2 22   < > 23  GLUCOSE 409*   < > 173*  BUN 76*   < > 48*  CREATININE 2.70*   < > 1.51*  CALCIUM 8.9   < > 8.6*  MG  --    < > 2.0  AST 51*  --   --   ALT 50*  --   --   ALKPHOS 87  --   --   BILITOT 0.6  --   --    < > = values in this interval not displayed.   RADIOLOGY:  No results found. ASSESSMENT AND PLAN:   1. Acute kidney injury superimposed on chronic kidney disease stage 3 Renal function continues to improve Seen by nephrologist.  Appreciate input.  Renal ultrasound done with findings consistent with medical renal disease.  No hydronephrosis.  Possible  10 mm nonobstructing right renal nephrolithiasis. Patient currently being hydrated with gentle IV fluids with normal saline at 50 cc an hour by nephrologist.  Recommends continuing the same till tomorrow.  Follow-up on renal function in a.m.  2.  Chronic diastolic  CHF Stable.  Continue home meds 2D echocardiogram done with ejection fraction of 60 to 65%.  3.  Hypertension Blood pressure was initially low this morning.  Repeat blood pressure improved. Gradually resuming home meds including amiodarone.  4.  Diabetes mellitus type 2. Placed on sliding scale insulin coverage.  Monitor and  adjust regimen as needed  5.  Peripheral artery disease Stable.  Continue home meds  6. Chronic nonhealing trauma wounds.  Pressure Injury POA: NA Measurement: Proximal:  Left anterior lower leg:  3 cm x 2.4 cm slough to wound bed. Distal wound:  1 cm x 0.5 cm slough to wound bed Recommendation is to cleanse wounds to left leg with NS and pat dry.  Apply Santyl to wound beds.  Cover with NS moist 2x2 and cover with gauze and kerlix.  Change daily.  No compression at this time.  Complains of intermittent left lower extremity pain.  Placed on PRN Percocet.  DVT prophylaxis; heparin   All the records are reviewed and case discussed with Care Management/Social Worker. Management plans discussed with the patient, family and they are in agreement.  CODE STATUS: Full Code  TOTAL TIME TAKING CARE OF THIS PATIENT: 25 minutes.   More than 50% of the time was spent in counseling/coordination of care: YES  POSSIBLE D/C IN 1 DAY, DEPENDING ON CLINICAL CONDITION.   Crytal Pensinger M.D on 03/11/2018 at 2:51 PM  Between 7am to 6pm - Pager - [phone]  After 6pm go to [email or web address] - Proofreader  [facility]  Office  [phone]  CC: Primary care physician; Rusty Aus, MD  Note: This dictation was prepared with Dragon dictation along with smaller phrase technology. Any transcriptional errors that result from this process are unintentional.

## 2018-03-11 NOTE — Progress Notes (Signed)
[facility]  ROUNDING NOTE   Subjective:  Renal function continues to slowly improve. Creatinine down to 1.5. Sharon [name] in good spirits today.   Objective:  Vital signs in last 24 hours:  Temp:  [97.8 F (36.6 C)-98.5 F (36.9 C)] 98 F (36.7 C) (02/27 0743) Pulse Rate:  [63-91] 91 (02/27 0743) Resp:  [14-20] 14 (02/27 0743) BP: (124-167)/(44-85) 124/74 (02/27 0743) SpO2:  [92 %-96 %] 96 % (02/27 0743) Weight:  [65.6 kg] 65.6 kg (02/27 0500)  Weight change: -0.71 kg Filed Weights   03/08/18 1553 03/10/18 0414 03/11/18 0500  Weight: 70.3 kg 66.3 kg 65.6 kg    Intake/Output: I/O last 3 completed shifts: In: 957.2 [P.O.:240; I.V.:717.2] Out: 500 [Urine:500]   Intake/Output this shift:  Total I/O In: 346.5 [I.V.:346.5] Out: 250 [Urine:250]  Physical Exam: General: No acute distress  Head: Normocephalic, atraumatic. Moist oral mucosal membranes  Eyes: Anicteric  Neck: Supple, trachea midline  Lungs:  Clear to auscultation, normal effort  Heart: S1S2 no rubs  Abdomen:  Soft, nontender, bowel sounds present  Extremities: Trace peripheral edema.  Neurologic: Awake, alert, following commands  Skin: No lesions       Basic Metabolic Panel: Recent Labs  Lab 03/08/18 1556 03/09/18 0354 03/10/18 0517 03/11/18 0514  NA 130* 134* 136 136  K 4.6 5.0 4.1 4.2  CL 95* 97* 100 102  CO2 22 24 27 23   GLUCOSE 409* 275* 175* 173*  BUN 76* 73* 69* 48*  CREATININE 2.70* 2.21* 1.88* 1.51*  CALCIUM 8.9 9.0 8.7* 8.6*  MG  --   --  2.1 2.0  PHOS  --   --  3.3  --     Liver Function Tests: Recent Labs  Lab 03/08/18 1556  AST 51*  ALT 50*  ALKPHOS 87  BILITOT 0.6  PROT 7.6  ALBUMIN 4.2   No results for input(s): LIPASE, AMYLASE in the last 168 hours. No results for input(s): AMMONIA in the last 168 hours.  CBC: Recent Labs  Lab 03/08/18 1556 03/09/18 0525 03/10/18 0517  WBC 11.3* 10.9* 9.3  NEUTROABS 8.1*  --   --   HGB 12.5 11.8* 10.7*  HCT  38.0 35.7* 33.1*  MCV 88.4 88.1 89.9  PLT 207 207 204    Cardiac Enzymes: Recent Labs  Lab 03/08/18 1958  TROPONINI <0.03    BNP: Invalid input(s): POCBNP  CBG: Recent Labs  Lab 03/10/18 1148 03/10/18 1620 03/10/18 2152 03/11/18 0742 03/11/18 1128  GLUCAP 190* 237* 178* 209* 29*    Microbiology: Results for orders placed or performed during the hospital encounter of 03/08/18  MRSA PCR Screening     Status: Abnormal   Collection Time: 03/09/18  5:42 AM  Result Value Ref Range Status   MRSA by PCR POSITIVE (A) NEGATIVE Final    Comment:        The GeneXpert MRSA Assay (FDA approved for NASAL specimens only), is one component of a comprehensive MRSA colonization surveillance program. It is not intended to diagnose MRSA infection nor to guide or monitor treatment for MRSA infections. RESULT CALLED TO, READ BACK BY AND VERIFIED WITH: STEPHANIE SUMMERS 03/09/2018 0818 MU Performed at [facility], [address] [place], [place] [address]     Coagulation Studies: No results for input(s): LABPROT, INR in the last 72 hours.  Urinalysis: No results for input(s): COLORURINE, LABSPEC, PHURINE, GLUCOSEU, HGBUR, BILIRUBINUR, KETONESUR, PROTEINUR, UROBILINOGEN, NITRITE, LEUKOCYTESUR in the last 72 hours.  Invalid input(s): APPERANCEUR  Imaging: US Renal  Result Date: 03/09/2018 CLINICAL DATA:  Initial evaluation for acute renal failure. EXAM: RENAL / URINARY TRACT ULTRASOUND COMPLETE COMPARISON:  Prior CT from 04/17/2017. FINDINGS: Right Kidney: Renal measurements: 11.0 x 4.2 x 3.9 cm = volume: 94.1 mL. Diffusely increased echogenicity within the renal parenchyma, compatible with medical renal disease. No hydronephrosis. No discrete mass. 10 mm shadowing echogenic lesion within the lower pole, possibly reflecting a nonobstructive stone. Left Kidney: Renal measurements: 9.0 x 3.9 x 3.7 cm = volume: 68.4 mL. Diffusely increased echogenicity within the renal  parenchyma, suggesting medical renal disease. No hydronephrosis. Chronic cortical thinning noted, most notable at the lower pole. No definite focal lesion identified, although examination somewhat technically limited with poor visualization of the left kidney. Bladder: Appears normal for degree of bladder distention. IMPRESSION: 1. Increased echogenicity within the renal parenchyma bilaterally, compatible with medical renal disease. 2. No hydronephrosis. 3. Possible 10 mm nonobstructive right renal nephrolithiasis. Electronically Signed   By: Jeannine Boga M.D.   On: 03/09/2018 16:21     Medications:   . sodium chloride 50 mL/hr at 03/11/18 1000   . amiodarone  200 mg Oral Daily  . amitriptyline  10 mg Oral Daily  . atorvastatin  10 mg Oral q1800  . Chlorhexidine Gluconate Cloth  6 each Topical Q0600  . collagenase   Topical Daily  . heparin  5,000 Units Subcutaneous Q8H  . insulin aspart  0-5 Units Subcutaneous QHS  . insulin aspart  0-9 Units Subcutaneous TID WC  . mupirocin ointment  1 application Nasal BID  . senna-docusate  1 tablet Oral BID   acetaminophen **OR** acetaminophen, guaiFENesin-dextromethorphan, ondansetron **OR** ondansetron (ZOFRAN) IV, oxyCODONE-acetaminophen, traZODone  Assessment/ Plan:  Sharon y.o. female with a PMHx of chronic kidney disease stage IIIbaseline creatinine 1.2, atrial fibrillation, congestive heart failure, diabetes mellitus type 2, hypertension, hyperlipidemia, hypothyroidism, peripheral neuropathy, osteomyelitis, prior history of CVA, who was admitted to [facility] on 03/08/2018 for evaluation of abnormal labs.  1.  Acute renal failure/chronic kidney disease stage [name] baseline creatinine 1.2.  Suspect that overdiuresis is playing some role in acute renal failure as Sharon [name] was on Lasix as well as spironolactone.   -renal function continues to improve.  Creatinine at 1.5.  Continue IV fluid hydration for one additional day.  2.  Hyponatremia.    Serum  sodium now up to 136 and acceptable.  Continue to monitor.   LOS: 1 Sharon [name] 2/27/20203:39 PM

## 2018-03-11 NOTE — Progress Notes (Signed)
Inpatient Diabetes Program Recommendations  AACE/ADA: New Consensus Statement on Inpatient Glycemic Control (2015)  Target Ranges:  Prepandial:   less than 140 mg/dL      Peak postprandial:   less than 180 mg/dL (1-2 hours)      Critically ill patients:  140 - 180 mg/dL   Results for [name], [name] (MRN [id number]) as of 03/11/2018 13:40  Ref. Range 03/10/2018 07:37 03/10/2018 11:48 03/10/2018 16:20 03/10/2018 21:52  Glucose-Capillary Latest Ref Range: 70 - 99 mg/dL 166 (H)  2 units NOVOLOG  190 (H)  2 units NOVOLOG  237 (H)  3 units NOVOLOG  178 (H)   Results for [name], [name] (MRN [id number]) as of 03/11/2018 13:40  Ref. Range 03/11/2018 07:42 03/11/2018 11:28  Glucose-Capillary Latest Ref Range: 70 - 99 mg/dL 209 (H)  3 units NOVOLOG  281 (H)  5 units NOVOLOG     Home DM Meds: Amaryl 4 mg Daily       Januvia 50 mg Daily  Current Orders: Novolog Sensitive Correction Scale/ SSI (0-9 units) TID AC + HS       MD- Please consider the following in-hospital insulin adjustments while home oral DM meds are on hold:  1. Start Lantus 6 units Daily (0.1 units/kg based on weight of 65 kg)  2. Increase Novolog SSi to Moderate scale (0-15 units) TID AC + HS     --Will follow patient during hospitalization--  Wyn Quaker RN, MSN, CDE Diabetes Coordinator Inpatient Glycemic Control Team Team Pager: [phone] (8a-5p)

## 2018-03-12 DIAGNOSIS — L8990 Pressure ulcer of unspecified site, unspecified stage: Secondary | ICD-10-CM

## 2018-03-12 LAB — BASIC METABOLIC PANEL
Anion gap: 10 (ref 5–15)
BUN: 25 mg/dL — AB (ref 8–23)
CO2: 23 mmol/L (ref 22–32)
Calcium: 8.4 mg/dL — ABNORMAL LOW (ref 8.9–10.3)
Chloride: 103 mmol/L (ref 98–111)
Creatinine, Ser: 1.12 mg/dL — ABNORMAL HIGH (ref 0.44–1.00)
GFR calc Af Amer: 53 mL/min — ABNORMAL LOW (ref >60)
GFR calc non Af Amer: 46 mL/min — ABNORMAL LOW (ref >60)
Glucose, Bld: 201 mg/dL — ABNORMAL HIGH (ref 70–99)
POTASSIUM: 4 mmol/L (ref 3.5–5.1)
Sodium: 136 mmol/L (ref 135–145)

## 2018-03-12 LAB — GLUCOSE, CAPILLARY
Glucose-Capillary: 192 mg/dL — ABNORMAL HIGH (ref 70–99)
Glucose-Capillary: 219 mg/dL — ABNORMAL HIGH (ref 70–99)

## 2018-03-12 MED ORDER — COLLAGENASE 250 UNIT/GM EX OINT: TOPICAL_OINTMENT | Freq: Every day | CUTANEOUS | 0 refills | Status: DC

## 2018-03-12 MED ORDER — TRAMADOL HCL 50 MG PO TABS: 50.0000 mg | ORAL_TABLET | Freq: Four times a day (QID) | ORAL | 0 refills | Status: DC | PRN

## 2018-03-12 MED ORDER — MUPIROCIN 2 % EX OINT: 1.0000 "application " | TOPICAL_OINTMENT | Freq: Two times a day (BID) | CUTANEOUS | 0 refills | Status: AC

## 2018-03-12 NOTE — Discharge Summary (Signed)
[place] [facility] [place] NAME: Sharon [name]    MR#:  [id number]  DATE OF BIRTH:  [birth year]  DATE OF ADMISSION:  03/08/2018   ADMITTING PHYSICIAN: Lance Coon, MD  DATE OF DISCHARGE: 03/12/2018 PRIMARY CARE PHYSICIAN: Rusty Aus, MD   ADMISSION DIAGNOSIS:  Acute renal insufficiency [N28.9] DISCHARGE DIAGNOSIS:  Principal Problem:   Acute on chronic renal failure (HCC) Active Problems:   Chronic diastolic heart failure (HCC)   HTN (hypertension)   DM (diabetes mellitus), type 2, uncontrolled (HCC)   PAD (peripheral artery disease) (HCC)   Bilateral lower extremity edema   Pressure injury of skin  SECONDARY DIAGNOSIS:   Past Medical History:  Diagnosis Date  . Anemia   . Atrial fibrillation ([place])   . Cardiac arrest ([place])   . Cataract   . CHF (congestive heart failure) ([place])   . Diabetes mellitus without complication ([place])   . Edema    feet/ankles occas  . Hip fracture ([place])   . Hyperlipidemia   . Hypertension   . Hypothyroid   . Neuropathy   . Osteomyelitis ([place])    left first metatarsal  . Stroke [facility])    HOSPITAL COURSE:  Chief complaint; abnormal labs Patient from assisted living facility   History of presenting complaint; Sharon [name]  is a 85 y.o. female who presented to the ED for elevated creatinine.  She has significant lower extremity edema and recently was placed on Lasix.  On laboratory evaluation her creatinine has risen significantly from her baseline.  She is also been having some increased cough and shortness of breath.  She was sent to the ED and on evaluation was found to have some interstitial edema on chest x-ray.  Her creatinine is also up to 2.7, from a baseline around 1-1/2.  Hospitalist were called for admission.  Please refer to the H&P dictated for further details   Hospital course 1. Acute kidney injury superimposed on chronic kidney disease stage 3 Renal function significantly improved  with IV fluid hydration.  Creatinine down from 2.721.12. Seen by nephrologist.  Appreciate input.  Renal ultrasound done with findings consistent with medical renal disease.  No hydronephrosis.  Possible 10 mm nonobstructing right renal nephrolithiasis.  Clinically and hemodynamically stable for discharge.  Cleared for discharge by nephrology.  2.  Chronic diastolic  CHF Stable.  Continue home meds 2D echocardiogram done with ejection fraction of 60 to 65%.  3.  Hypertension Blood pressure remained stable holding off on resuming losartan, Lasix and spironolactone due to recent acute kidney injury.  Primary care physician to monitor blood pressure and adjust meds as needed.  4.  Diabetes mellitus type 2. Resume home regimen.  5.  Peripheral artery disease Stable.  Continue home meds  6.Chronic nonhealing trauma wounds. Pressure Injury POA: NA Measurement:Proximal: Left anterior lower leg: 3 cm x 2.4 cm slough to wound bed. Distal wound: 1 cm x 0.5 cm slough to wound bed Recommendation is to cleanse wounds to left leg with NS and pat dry. Apply Santyl to wound beds. Cover with NS moist 2x2 and cover with gauze and kerlix. Change daily. No compression at this time.  Complains of intermittent left lower extremity pain.   Setting up home health with skilled nursing to assist with wound care at the assisted living facility.  Called and updated patient's son over the phone on treatment plans as well as discharge plans.  All questions were answered.  He agrees  with plan of care as well as discharge plans as outlined  DISCHARGE CONDITIONS:  Stable CONSULTS OBTAINED:  Treatment Team:  Otila Back, MD DRUG ALLERGIES:   Allergies  Allergen Reactions  . Carbamazepine Other (See Comments)    Reaction: unknown  . Cymbalta [Duloxetine Hcl] Other (See Comments)    Confusion, disorientation  . Duloxetine     Other reaction(s): Other (See Comments) Confusion, disorientation  .  Lyrica [Pregabalin] Other (See Comments)    Patient and son states this makes the patient confused.  . Temazepam     Other reaction(s): Hallucination   DISCHARGE MEDICATIONS:   Allergies as of 03/12/2018      Reactions   Carbamazepine Other (See Comments)   Reaction: unknown   Cymbalta [duloxetine Hcl] Other (See Comments)   Confusion, disorientation   Duloxetine    Other reaction(s): Other (See Comments) Confusion, disorientation   Lyrica [pregabalin] Other (See Comments)   Patient and son states this makes the patient confused.   Temazepam    Other reaction(s): Hallucination      Medication List    STOP taking these medications   furosemide 20 MG tablet Commonly known as:  LASIX   losartan 25 MG tablet Commonly known as:  COZAAR   spironolactone 25 MG tablet Commonly known as:  ALDACTONE     TAKE these medications   acetaminophen 500 MG tablet Commonly known as:  TYLENOL Take 1,000 mg by mouth at bedtime. For pain management   acetaminophen 650 MG CR tablet Commonly known as:  TYLENOL Take 650 mg by mouth every 8 (eight) hours as needed. For discomfort   amiodarone 200 MG tablet Commonly known as:  PACERONE Take 1 tablet (200 mg total) by mouth daily.   amitriptyline 10 MG tablet Commonly known as:  ELAVIL Take 10 mg by mouth daily.   atorvastatin 10 MG tablet Commonly known as:  LIPITOR Take 10 mg by mouth daily at 10 pm.   bisacodyl 5 MG EC tablet Commonly known as:  DULCOLAX Take 2 tablets (10 mg total) by mouth every other day.   cholecalciferol 1000 units tablet Commonly known as:  VITAMIN D Take 1,000 Units by mouth daily.   collagenase ointment Commonly known as:  SANTYL Apply topically daily.   feeding supplement (PRO-STAT SUGAR FREE 64) Liqd Take 30 mLs by mouth 2 (two) times daily between meals.   ferrous sulfate 325 (65 FE) MG tablet Take 325 mg by mouth daily.   gabapentin 100 MG capsule Commonly known as:  NEURONTIN Take 200 mg  by mouth 3 (three) times daily.   glimepiride 4 MG tablet Commonly known as:  AMARYL Take 4 mg by mouth daily.   GLUCERNA Liqd Take 237 mLs by mouth 2 (two) times daily between meals.   JANUVIA 50 MG tablet Generic drug:  sitaGLIPtin Take 50 mg by mouth daily. Take along with Amaryl   levothyroxine 112 MCG tablet Commonly known as:  SYNTHROID, LEVOTHROID Take 112 mcg by mouth daily before breakfast. 30 minutes before breakfast   metoprolol tartrate 25 MG tablet Commonly known as:  LOPRESSOR Take 1 tablet (25 mg total) by mouth 2 (two) times daily.   mupirocin ointment 2 % Commonly known as:  BACTROBAN Place 1 application into the nose 2 (two) times daily for 5 days.   senna-docusate 8.6-50 MG tablet Commonly known as:  Senokot-S Take 1 tablet by mouth 2 (two) times daily.   traMADol 50 MG tablet Commonly known as:  Veatrice Bourbon  Take 1 tablet (50 mg total) by mouth every 6 (six) hours as needed.   traZODone 50 MG tablet Commonly known as:  DESYREL Take 50 mg by mouth at bedtime. For anxiety   vitamin C 500 MG tablet Commonly known as:  ASCORBIC ACID Take 500 mg by mouth daily.        DISCHARGE INSTRUCTIONS:   DIET:  Diabetic diet DISCHARGE CONDITION:  Stable ACTIVITY:  Activity as tolerated OXYGEN:  Home Oxygen: No.  Oxygen Delivery: room air DISCHARGE LOCATION:  Assisted living facility  If you experience worsening of your admission symptoms, develop shortness of breath, life threatening emergency, suicidal or homicidal or atyicidal thoughts you must seek medical attention immediately by calling 911 or calling your MD immediately  if symptoms less severe.  You Must read complete instructions/literature along with all the possible adverse reactions/side effects for all the Medicines you take and that have been prescribed to you. Take any new Medicines after you have completely understood and accpet all the possible adverse reactions/side effects.   Please  note  You were cared for by a hospitalist during your hospital stay. If you have any questions about your discharge medications or the care you received while you were in the hospital after you are discharged, you can call the unit and asked to speak with the hospitalist on call if the hospitalist that took care of you is not available. Once you are discharged, your primary care physician will handle any further medical issues. Please note that NO REFILLS for any discharge medications will be authorized once you are discharged, as it is imperative that you return to your primary care physician (or establish a relationship with a primary care physician if you do not have one) for your aftercare needs so that they can reassess your need for medications and monitor your lab values.    On the day of Discharge:  VITAL SIGNS:  Blood pressure 119/75, pulse 82, temperature 98 F (36.7 C), temperature source Oral, resp. rate 18, height 5\' 5"  (1.651 m), weight 64.9 kg, SpO2 94 %. PHYSICAL EXAMINATION:  GENERAL:  85 y.o.-year-old patient lying in the bed with no acute distress.  EYES: Pupils equal, round, reactive to light and accommodation. No scleral icterus. Extraocular muscles intact.  HEENT: Head atraumatic, normocephalic. Oropharynx and nasopharynx clear.  NECK:  Supple, no jugular venous distention. No thyroid enlargement, no tenderness.  LUNGS: Normal breath sounds bilaterally, no wheezing, rales,rhonchi or crepitation. No use of accessory muscles of respiration.  CARDIOVASCULAR: S1, S2 normal. No murmurs, rubs, or gallops.  ABDOMEN: Soft, non-tender, non-distended. Bowel sounds present. No organomegaly or mass.  EXTREMITIES: No pedal edema, cyanosis, Dressing in place to left lower extremity.  NEUROLOGIC: Cranial nerves II through XII are intact. Muscle strength 5/5 in all extremities. Sensation intact. Gait not checked.  PSYCHIATRIC: The patient is alert and oriented x 3.  SKIN: No obvious rash,  lesion, or ulcer.  DATA REVIEW:   CBC Recent Labs  Lab 03/10/18 0517  WBC 9.3  HGB 10.7*  HCT 33.1*  PLT 204    Chemistries  Recent Labs  Lab 03/08/18 1556  03/11/18 0514 03/12/18 0815  NA 130*   < > 136 136  K 4.6   < > 4.2 4.0  CL 95*   < > 102 103  CO2 22   < > 23 23  GLUCOSE 409*   < > 173* 201*  BUN 76*   < > 48* 25*  CREATININE  2.70*   < > 1.51* 1.12*  CALCIUM 8.9   < > 8.6* 8.4*  MG  --    < > 2.0  --   AST 51*  --   --   --   ALT 50*  --   --   --   ALKPHOS 87  --   --   --   BILITOT 0.6  --   --   --    < > = values in this interval not displayed.     Microbiology Results  Results for orders placed or performed during the hospital encounter of 03/08/18  MRSA PCR Screening     Status: Abnormal   Collection Time: 03/09/18  5:42 AM  Result Value Ref Range Status   MRSA by PCR POSITIVE (A) NEGATIVE Final    Comment:        The GeneXpert MRSA Assay (FDA approved for NASAL specimens only), is one component of a comprehensive MRSA colonization surveillance program. It is not intended to diagnose MRSA infection nor to guide or monitor treatment for MRSA infections. RESULT CALLED TO, READ BACK BY AND VERIFIED WITH: Linard Millers 03/09/2018 0818 MU Performed at [facility], [address], [place], [place] [address]     RADIOLOGY:  No results found.   Management plans discussed with the patient, family and they are in agreement.  CODE STATUS: Full Code   TOTAL TIME TAKING CARE OF THIS PATIENT: 40 minutes.    Skylen Spiering M.D on 03/12/2018 at 12:34 PM  Between 7am to 6pm - Pager - [phone]  After 6pm go to [email or web address] - Proofreader  [facility] [place] [facility]  Office  [phone]  CC: Primary care physician; Rusty Aus, MD   Note: This dictation was prepared with Dragon dictation along with smaller phrase technology. Any transcriptional errors that result from this process are unintentional.

## 2018-03-12 NOTE — Clinical Social Work Note (Signed)
Clinical Social Work Assessment  Patient Details  Name: Sharon [name] MRN: [id number] Date of Birth: 04-14-1933  Date of referral:  03/12/18               Reason for consult:  Other (Comment Required)(From [facility] )                Permission sought to share information with:  Chartered certified accountant granted to share information::  Yes, Verbal Permission Granted  Name::      [facility]   Agency::     Relationship::     Contact Information:     Housing/Transportation Living arrangements for the past 2 months:  [name]:  Adult Children, Facility Patient Interpreter Needed:  None Criminal Activity/Legal Involvement Pertinent to Current Situation/Hospitalization:  No - Comment as needed Significant Relationships:  Adult Children Lives with:  Facility Resident Do you feel safe going back to the place where you live?  Yes Need for family participation in patient care:  Yes (Comment)  Care giving concerns:  Patient is a resident at [name] (fax: [phone]).    Social Worker assessment / plan:  Holiday representative ([place]) received verbal consult from RN that patient is from [facility] and is ready for D/C today. Per [place] and Wellness director patient is on room air and uses a wheel chair at baseline. Per Melissa patient is a stand to pivot and plus 2 assistance at baseline. Per Melissa patient can return to [facility] today. CSW contacted [name] who reported that he will transport patient back to [facility] today. RN case manager will arrange home health. CSW sent D/C summary and FL2 to [facility]. Please reconsult if future social work needs arise. CSW signing off.      Employment status:  Disabled (Comment on whether or not currently receiving Disability), Retired Nurse, adult PT Recommendations:  Not assessed at this time Information  / Referral to community resources:  Other (Comment Required)(Patient will return to ALF with home health. )  Patient/Family's Response to care:  Patient's son is agreeable to transport patient back to [facility] today.   Patient/Family's Understanding of and Emotional Response to Diagnosis, Current Treatment, and Prognosis:  Patient's son was very pleasant and thanked CSW for assistance.   Emotional Assessment Appearance:  Appears stated age Attitude/Demeanor/Rapport:    Affect (typically observed):  Accepting, Adaptable, Pleasant Orientation:  Oriented to Self, Oriented to Place, Oriented to  Time, Fluctuating Orientation (Suspected and/or reported Sundowners) Alcohol / Substance use:  Not Applicable Psych involvement (Current and /or in the community):  No (Comment)  Discharge Needs  Concerns to be addressed:  No discharge needs identified Readmission within the last 30 days:  No Current discharge risk:  None Barriers to Discharge:  No Barriers Identified   Sharon [name], Sharon Beets, LCSW 03/12/2018, 3:51 PM

## 2018-03-12 NOTE — Care Management Note (Signed)
Case Management Note  Patient Details  Name: Sharon [name] MRN: [id number] Date of Birth: 1933-08-06  Subjective/Objective:     Patient is discharging home to [place] with home health services.  Patient would like [place] home health.  [facility] to [facility], PT and aide.  Son to transport back to [facility].        Action/Plan:   Expected Discharge Date:  03/12/18               Expected Discharge Plan:  [place]  In-House Referral:     Discharge planning Services  CM Consult  Post Acute Care Choice:    Choice offered to:  Patient  DME Arranged:    DME Agency:     HH Arranged:  RN, PT, Nurse's Aide [place] Agency:  [place]  Status of Service:  Completed, signed off  If discussed at [place] of Stay Meetings, dates discussed:    Additional Comments:  Elza Rafter, RN 03/12/2018, 1:51 PM

## 2018-03-12 NOTE — Plan of Care (Signed)
  Problem: Education: Goal: Knowledge of General Education information will improve Description Including pain rating scale, medication(s)/side effects and non-pharmacologic comfort measures Outcome: Adequate for Discharge   Problem: Health Behavior/Discharge Planning: Goal: Ability to manage health-related needs will improve Outcome: Adequate for Discharge   Problem: Clinical Measurements: Goal: Ability to maintain clinical measurements within normal limits will improve Outcome: Adequate for Discharge Goal: Will remain free from infection Outcome: Adequate for Discharge Goal: Diagnostic test results will improve Outcome: Adequate for Discharge Goal: Respiratory complications will improve Outcome: Adequate for Discharge Goal: Cardiovascular complication will be avoided Outcome: Adequate for Discharge   Problem: Activity: Goal: Risk for activity intolerance will decrease Outcome: Adequate for Discharge   Problem: Elimination: Goal: Will not experience complications related to bowel motility Outcome: Adequate for Discharge Goal: Will not experience complications related to urinary retention Outcome: Adequate for Discharge   Problem: Pain Managment: Goal: General experience of comfort will improve Outcome: Adequate for Discharge   Problem: Safety: Goal: Ability to remain free from injury will improve Outcome: Adequate for Discharge   Problem: Skin Integrity: Goal: Risk for impaired skin integrity will decrease Outcome: Adequate for Discharge

## 2018-03-12 NOTE — NC FL2 (Signed)
[place] LEVEL OF CARE SCREENING TOOL     IDENTIFICATION  Patient Name: Sharon [name] Birthdate: 1933-06-07 Sex: female Admission Date (Current Location): 03/08/2018  [place] and [place] Number:  Engineering geologist and Address:  [facility], [address], [place], [place] [address]      Provider Number: [id number]  Attending Physician Name and Address:  Otila Back, MD  Relative Name and Phone Number:       Current Level of Care: Hospital Recommended Level of Care: [place] Prior Approval Number:    Date Approved/Denied:   PASRR Number: [id number])  Discharge Plan: Domiciliary (Rest home)    Current Diagnoses: Patient Active Problem List   Diagnosis Date Noted  . Pressure injury of skin 03/12/2018  . Atherosclerosis of native arteries of the extremities with ulceration ([place]) 05/07/2017  . Non-healing ulcer ([place]) 04/16/2017  . Diabetic neuropathy ([place]) 04/07/2017  . Septic shock ([place]) 03/20/2017  . CKD (chronic kidney disease), stage III ([place]) 03/19/2017  . Goals of care, counseling/discussion 03/03/2017  . PAD (peripheral artery disease) ([place]) 02/03/2017  . Abdominal aortic stenosis 02/03/2017  . Bilateral lower extremity edema 02/03/2017  . Anemia 01/29/2017  . B12 deficiency 01/29/2017  . Pancreatic mass 01/29/2017  . Hyponatremia 07/07/2015  . Foot ulcer ([place]) 02/07/2015  . Acute on chronic renal failure ([place]) 11/05/2014  . Diabetic foot infection ([place]) 10/10/2014  . Chronic diastolic heart failure ([place]) 05/31/2014  . HTN (hypertension) 05/31/2014  . DM (diabetes mellitus), type 2, uncontrolled ([place]) 05/31/2014    Orientation RESPIRATION BLADDER Height & Weight     Self, Time, Situation, Place  Normal Incontinent Weight: 143 lb 1.6 oz (64.9 kg) Height:  5\' 5"  (165.1 cm)  BEHAVIORAL SYMPTOMS/MOOD NEUROLOGICAL BOWEL NUTRITION STATUS      Incontinent Diet(Diet: Low Sodium/ Heart Healthy )   AMBULATORY STATUS COMMUNICATION OF NEEDS Skin   Extensive Assist Verbally PU Stage and Appropriate Care(Left anterior lower leg: 3 cm x 2.4 cm slough to wound bed Cleanse wounds to left leg with NS and pat dry. Apply Santyl to wound beds. Cover with NS moist 2x2 and cover with gauze and kerlix. Change daily. No compression at this time. )                       Personal Care Assistance Level of Assistance  Bathing, Feeding, Dressing Bathing Assistance: Limited assistance Feeding assistance: Independent Dressing Assistance: Limited assistance     Functional Limitations Info  Sight, Hearing, Speech Sight Info: Adequate Hearing Info: Adequate Speech Info: Adequate    SPECIAL CARE FACTORS FREQUENCY  PT (By licensed PT)     PT Frequency: (2-3 home health PT and RN. )              Contractures      Additional Factors Info  Code Status, Allergies Code Status Info: (Full Code. ) Allergies Info: (Carbamazepine, Cymbalta Duloxetine Hcl, Duloxetine, )           Current Medications (03/12/2018):  This is the current hospital active medication list Current Facility-Administered Medications  Medication Dose Route Frequency Provider Last Rate Last Dose  . 0.9 %  sodium chloride infusion   Intravenous Continuous Lateef, Munsoor, MD 50 mL/hr at 03/11/18 1709    . acetaminophen (TYLENOL) tablet 650 mg  650 mg Oral Q6H PRN Lance Coon, MD   650 mg at 03/10/18 1056   Or  . acetaminophen (TYLENOL) suppository 650  mg  650 mg Rectal Q6H PRN Lance Coon, MD      . amiodarone (PACERONE) tablet 200 mg  200 mg Oral Daily Stark Jock, Jude, MD   200 mg at 03/12/18 2585  . amitriptyline (ELAVIL) tablet 10 mg  10 mg Oral Daily Stark Jock, Jude, MD   10 mg at 03/12/18 0824  . atorvastatin (LIPITOR) tablet 10 mg  10 mg Oral q1800 Ojie, Jude, MD   10 mg at 03/11/18 1708  . Chlorhexidine Gluconate Cloth 2 % PADS 6 each  6 each Topical Q0600 Stark Jock Jude, MD   6 each at 03/12/18 0427  . collagenase (SANTYL)  ointment   Topical Daily Ojie, Jude, MD      . guaiFENesin-dextromethorphan (ROBITUSSIN DM) 100-10 MG/5ML syrup 5 mL  5 mL Oral Q4H PRN Harrie Foreman, MD      . heparin injection 5,000 Units  5,000 Units Subcutaneous Camelia Phenes Lance Coon, MD   5,000 Units at 03/12/18 [phone]  . insulin aspart (novoLOG) injection 0-5 Units  0-5 Units Subcutaneous QHS Lance Coon, MD      . insulin aspart (novoLOG) injection 0-9 Units  0-9 Units Subcutaneous TID WC Lance Coon, MD   2 Units at 03/12/18 [phone]  . mupirocin ointment (BACTROBAN) 2 % 1 application  1 application Nasal BID Stark Jock, Jude, MD   1 application at 53/61/44 2214  . ondansetron (ZOFRAN) tablet 4 mg  4 mg Oral Q6H PRN Lance Coon, MD       Or  . ondansetron [facility]) injection 4 mg  4 mg Intravenous Q6H PRN Lance Coon, MD      . oxyCODONE-acetaminophen (PERCOCET/ROXICET) 5-325 MG per tablet 1 tablet  1 tablet Oral Q6H PRN Stark Jock Jude, MD   1 tablet at 03/11/18 1553  . senna-docusate (Senokot-S) tablet 1 tablet  1 tablet Oral BID Vaughan Basta, MD   1 tablet at 03/11/18 2210  . traZODone (DESYREL) tablet 50 mg  50 mg Oral QHS PRN Fritzi Mandes, MD   50 mg at 03/11/18 2210   Facility-Administered Medications Ordered in Other Encounters  Medication Dose Route Frequency Provider Last Rate Last Dose  . sodium chloride flush (NS) 0.9 % injection 10 mL  10 mL Intracatheter PRN Karen Kitchens, NP         Discharge Medications: Please see discharge summary for a list of discharge medications.  Relevant Imaging Results:  Relevant Lab Results:   Additional Information (SSN: [id number])  Danijela Vessey, Veronia Beets, LCSW

## 2018-03-12 NOTE — Progress Notes (Signed)
Social worker aware of patients discharge today. Notified family would like to transport patient back to facility. Mel Almond will make arrangements.

## 2018-03-12 NOTE — Progress Notes (Signed)
Discharge packet given to patient's son, [name]. Patients son will be transporting patient back to [place]. Report given to [facility] at [place]. Instructed son to give discharge packet to facility, prescriptions in packet. IV taken out and tele monitor off. Son insisted to wheel patient down himself.

## 2018-03-12 NOTE — NC FL2 (Signed)
[place] LEVEL OF CARE SCREENING TOOL     IDENTIFICATION  Patient Name: Sharon [name] Birthdate: 12-11-1933 Sex: female Admission Date (Current Location): 03/08/2018  [place] and [place] Number:  Engineering geologist and Address:  [facility], [address] [place], [place] [address]      Provider Number: [id number]  Attending Physician Name and Address:  Otila Back, MD  Relative Name and Phone Number:       Current Level of Care: Hospital Recommended Level of Care: [place] Prior Approval Number:    Date Approved/Denied:   PASRR Number: [id number])  Discharge Plan: Domiciliary (Rest home)    Current Diagnoses: Patient Active Problem List   Diagnosis Date Noted  . Pressure injury of skin 03/12/2018  . Atherosclerosis of native arteries of the extremities with ulceration ([address]) 05/07/2017  . Non-healing ulcer ([place]) 04/16/2017  . Diabetic neuropathy ([place]) 04/07/2017  . Septic shock ([place]) 03/20/2017  . CKD (chronic kidney disease), stage III ([place]) 03/19/2017  . Goals of care, counseling/discussion 03/03/2017  . PAD (peripheral artery disease) ([place]) 02/03/2017  . Abdominal aortic stenosis 02/03/2017  . Bilateral lower extremity edema 02/03/2017  . Anemia 01/29/2017  . B12 deficiency 01/29/2017  . Pancreatic mass 01/29/2017  . Hyponatremia 07/07/2015  . Foot ulcer ([place]) 02/07/2015  . Acute on chronic renal failure ([place]) 11/05/2014  . Diabetic foot infection ([place]) 10/10/2014  . Chronic diastolic heart failure ([place]) 05/31/2014  . HTN (hypertension) 05/31/2014  . DM (diabetes mellitus), type 2, uncontrolled ([place]) 05/31/2014    Orientation RESPIRATION BLADDER Height & Weight     Self, Time, Situation, Place  Normal Incontinent Weight: 143 lb 1.6 oz (64.9 kg) Height:  5\' 5"  (165.1 cm)  BEHAVIORAL SYMPTOMS/MOOD NEUROLOGICAL BOWEL NUTRITION STATUS      Incontinent Diet(Diet: Low Sodium/ Heart Healthy )   AMBULATORY STATUS COMMUNICATION OF NEEDS Skin   Extensive Assist Verbally PU Stage and Appropriate Care(Left anterior lower leg: 3 cm x 2.4 cm slough to wound bed Cleanse wounds to left leg with NS and pat dry. Apply Santyl to wound beds. Cover with NS moist 2x2 and cover with gauze and kerlix. Change daily. No compression at this time. )                       Personal Care Assistance Level of Assistance  Bathing, Feeding, Dressing Bathing Assistance: Limited assistance Feeding assistance: Independent Dressing Assistance: Limited assistance     Functional Limitations Info  Sight, Hearing, Speech Sight Info: Adequate Hearing Info: Adequate Speech Info: Adequate    SPECIAL CARE FACTORS FREQUENCY  PT (By licensed PT)     PT Frequency: (2-3 home health PT and RN. )              Contractures      Additional Factors Info  Code Status, Allergies Code Status Info: (Full Code. ) Allergies Info: (Carbamazepine, Cymbalta Duloxetine Hcl, Duloxetine, )          Discharge Medications: Please see discharge summary for a list of discharge medications. Medication List    STOP taking these medications   furosemide 20 MG tablet Commonly known as:  LASIX   losartan 25 MG tablet Commonly known as:  COZAAR   spironolactone 25 MG tablet Commonly known as:  ALDACTONE     TAKE these medications   acetaminophen 500 MG tablet Commonly known as:  TYLENOL Take 1,000 mg by mouth at bedtime. For  pain management   acetaminophen 650 MG CR tablet Commonly known as:  TYLENOL Take 650 mg by mouth every 8 (eight) hours as needed. For discomfort   amiodarone 200 MG tablet Commonly known as:  PACERONE Take 1 tablet (200 mg total) by mouth daily.   amitriptyline 10 MG tablet Commonly known as:  ELAVIL Take 10 mg by mouth daily.   atorvastatin 10 MG tablet Commonly known as:  LIPITOR Take 10 mg by mouth daily at 10 pm.   bisacodyl 5 MG EC tablet Commonly known as:   DULCOLAX Take 2 tablets (10 mg total) by mouth every other day.   cholecalciferol 1000 units tablet Commonly known as:  VITAMIN D Take 1,000 Units by mouth daily.   collagenase ointment Commonly known as:  SANTYL Apply topically daily.   feeding supplement (PRO-STAT SUGAR FREE 64) Liqd Take 30 mLs by mouth 2 (two) times daily between meals.   ferrous sulfate 325 (65 FE) MG tablet Take 325 mg by mouth daily.   gabapentin 100 MG capsule Commonly known as:  NEURONTIN Take 200 mg by mouth 3 (three) times daily.   glimepiride 4 MG tablet Commonly known as:  AMARYL Take 4 mg by mouth daily.   GLUCERNA Liqd Take 237 mLs by mouth 2 (two) times daily between meals.   JANUVIA 50 MG tablet Generic drug:  sitaGLIPtin Take 50 mg by mouth daily. Take along with Amaryl   levothyroxine 112 MCG tablet Commonly known as:  SYNTHROID, LEVOTHROID Take 112 mcg by mouth daily before breakfast. 30 minutes before breakfast   metoprolol tartrate 25 MG tablet Commonly known as:  LOPRESSOR Take 1 tablet (25 mg total) by mouth 2 (two) times daily.   mupirocin ointment 2 % Commonly known as:  BACTROBAN Place 1 application into the nose 2 (two) times daily for 5 days.   senna-docusate 8.6-50 MG tablet Commonly known as:  Senokot-S Take 1 tablet by mouth 2 (two) times daily.   traMADol 50 MG tablet Commonly known as:  ULTRAM Take 1 tablet (50 mg total) by mouth every 6 (six) hours as needed.   traZODone 50 MG tablet Commonly known as:  DESYREL Take 50 mg by mouth at bedtime. For anxiety   vitamin C 500 MG tablet Commonly known as:  ASCORBIC ACID Take 500 mg by mouth daily.   Relevant Imaging Results: Relevant Lab Results: Additional Information (SSN: [id number])  Desiraye Rolfson, [place] Beets, LCSW

## 2018-03-12 NOTE — Plan of Care (Signed)
Patient has made many frequent requests for staff. Patient has requested staff to remain in room with her. Completed documentation while in the room and remained with patient during toileting. Adjusted patients environment multiple times and repositioned the patient.  Explained to patient that I will increase rounding in between all  patient care. Will continue to monitor and assess.

## 2018-03-13 DIAGNOSIS — I480 Paroxysmal atrial fibrillation: Secondary | ICD-10-CM | POA: Diagnosis not present

## 2018-03-13 DIAGNOSIS — I2510 Atherosclerotic heart disease of native coronary artery without angina pectoris: Secondary | ICD-10-CM | POA: Diagnosis not present

## 2018-03-13 DIAGNOSIS — Z9181 History of falling: Secondary | ICD-10-CM | POA: Diagnosis not present

## 2018-03-13 DIAGNOSIS — N183 Chronic kidney disease, stage 3 (moderate): Secondary | ICD-10-CM | POA: Diagnosis not present

## 2018-03-13 DIAGNOSIS — E1142 Type 2 diabetes mellitus with diabetic polyneuropathy: Secondary | ICD-10-CM | POA: Diagnosis not present

## 2018-03-13 DIAGNOSIS — H3530 Unspecified macular degeneration: Secondary | ICD-10-CM | POA: Diagnosis not present

## 2018-03-13 DIAGNOSIS — S81802D Unspecified open wound, left lower leg, subsequent encounter: Secondary | ICD-10-CM | POA: Diagnosis not present

## 2018-03-13 DIAGNOSIS — Z7984 Long term (current) use of oral hypoglycemic drugs: Secondary | ICD-10-CM | POA: Diagnosis not present

## 2018-03-13 DIAGNOSIS — I5033 Acute on chronic diastolic (congestive) heart failure: Secondary | ICD-10-CM | POA: Diagnosis not present

## 2018-03-13 DIAGNOSIS — Z79891 Long term (current) use of opiate analgesic: Secondary | ICD-10-CM | POA: Diagnosis not present

## 2018-03-13 DIAGNOSIS — Z4800 Encounter for change or removal of nonsurgical wound dressing: Secondary | ICD-10-CM | POA: Diagnosis not present

## 2018-03-13 DIAGNOSIS — N170 Acute kidney failure with tubular necrosis: Secondary | ICD-10-CM | POA: Diagnosis not present

## 2018-03-13 DIAGNOSIS — D631 Anemia in chronic kidney disease: Secondary | ICD-10-CM | POA: Diagnosis not present

## 2018-03-13 DIAGNOSIS — E1151 Type 2 diabetes mellitus with diabetic peripheral angiopathy without gangrene: Secondary | ICD-10-CM | POA: Diagnosis not present

## 2018-03-13 DIAGNOSIS — Z8674 Personal history of sudden cardiac arrest: Secondary | ICD-10-CM | POA: Diagnosis not present

## 2018-03-13 DIAGNOSIS — I130 Hypertensive heart and chronic kidney disease with heart failure and stage 1 through stage 4 chronic kidney disease, or unspecified chronic kidney disease: Secondary | ICD-10-CM | POA: Diagnosis not present

## 2018-03-13 DIAGNOSIS — M810 Age-related osteoporosis without current pathological fracture: Secondary | ICD-10-CM | POA: Diagnosis not present

## 2018-03-13 DIAGNOSIS — I70203 Unspecified atherosclerosis of native arteries of extremities, bilateral legs: Secondary | ICD-10-CM | POA: Diagnosis not present

## 2018-03-15 ENCOUNTER — Ambulatory Visit: Payer: PPO | Admitting: Physical Therapy

## 2018-03-15 ENCOUNTER — Ambulatory Visit: Payer: PPO | Admitting: Occupational Therapy

## 2018-03-15 ENCOUNTER — Other Ambulatory Visit: Payer: Self-pay

## 2018-03-15 DIAGNOSIS — I70203 Unspecified atherosclerosis of native arteries of extremities, bilateral legs: Secondary | ICD-10-CM | POA: Diagnosis not present

## 2018-03-15 DIAGNOSIS — E1142 Type 2 diabetes mellitus with diabetic polyneuropathy: Secondary | ICD-10-CM | POA: Diagnosis not present

## 2018-03-15 DIAGNOSIS — Z8674 Personal history of sudden cardiac arrest: Secondary | ICD-10-CM | POA: Diagnosis not present

## 2018-03-15 DIAGNOSIS — Z7984 Long term (current) use of oral hypoglycemic drugs: Secondary | ICD-10-CM | POA: Diagnosis not present

## 2018-03-15 DIAGNOSIS — Z4800 Encounter for change or removal of nonsurgical wound dressing: Secondary | ICD-10-CM | POA: Diagnosis not present

## 2018-03-15 DIAGNOSIS — D631 Anemia in chronic kidney disease: Secondary | ICD-10-CM | POA: Diagnosis not present

## 2018-03-15 DIAGNOSIS — N170 Acute kidney failure with tubular necrosis: Secondary | ICD-10-CM | POA: Diagnosis not present

## 2018-03-15 DIAGNOSIS — E1151 Type 2 diabetes mellitus with diabetic peripheral angiopathy without gangrene: Secondary | ICD-10-CM | POA: Diagnosis not present

## 2018-03-15 DIAGNOSIS — M810 Age-related osteoporosis without current pathological fracture: Secondary | ICD-10-CM | POA: Diagnosis not present

## 2018-03-15 DIAGNOSIS — I130 Hypertensive heart and chronic kidney disease with heart failure and stage 1 through stage 4 chronic kidney disease, or unspecified chronic kidney disease: Secondary | ICD-10-CM | POA: Diagnosis not present

## 2018-03-15 DIAGNOSIS — N183 Chronic kidney disease, stage 3 (moderate): Secondary | ICD-10-CM | POA: Diagnosis not present

## 2018-03-15 DIAGNOSIS — Z79891 Long term (current) use of opiate analgesic: Secondary | ICD-10-CM | POA: Diagnosis not present

## 2018-03-15 DIAGNOSIS — S81802D Unspecified open wound, left lower leg, subsequent encounter: Secondary | ICD-10-CM | POA: Diagnosis not present

## 2018-03-15 DIAGNOSIS — I480 Paroxysmal atrial fibrillation: Secondary | ICD-10-CM | POA: Diagnosis not present

## 2018-03-15 DIAGNOSIS — I5033 Acute on chronic diastolic (congestive) heart failure: Secondary | ICD-10-CM | POA: Diagnosis not present

## 2018-03-15 DIAGNOSIS — Z9181 History of falling: Secondary | ICD-10-CM | POA: Diagnosis not present

## 2018-03-15 DIAGNOSIS — H3530 Unspecified macular degeneration: Secondary | ICD-10-CM | POA: Diagnosis not present

## 2018-03-15 DIAGNOSIS — I2510 Atherosclerotic heart disease of native coronary artery without angina pectoris: Secondary | ICD-10-CM | POA: Diagnosis not present

## 2018-03-15 NOTE — Patient Outreach (Signed)
Unsuccessful telephone call to Sharon [name] today.  I was unable to leave a voicemail due to mailbox being full.   Plan: Joetta Manners will follow up with Ms. Hinchey regarding post discharge medication review in 3-4 business days  Isaias Sakai, Pharm D PGY1 Pharmacy Resident  03/15/2018      11:41 AM

## 2018-03-17 ENCOUNTER — Encounter: Payer: PPO | Admitting: Occupational Therapy

## 2018-03-17 ENCOUNTER — Ambulatory Visit: Payer: Self-pay

## 2018-03-17 ENCOUNTER — Other Ambulatory Visit: Payer: Self-pay

## 2018-03-17 ENCOUNTER — Ambulatory Visit: Payer: PPO | Admitting: Physical Therapy

## 2018-03-17 NOTE — Patient Outreach (Signed)
[facility]) Care Management  [place]  03/17/2018  Jeweldean Drohan [place] 1933/08/02 [id number]   Reason for call: post discharge medication review  Unsuccessful telephone call attempt # 2 to patient.   Unable to leave message .  Plan:  I will make another outreach attempt to patient within 3-4 business days.  Joetta Manners, PharmD Clinical Pharmacist [place] [phone]

## 2018-03-19 DIAGNOSIS — I739 Peripheral vascular disease, unspecified: Secondary | ICD-10-CM | POA: Diagnosis not present

## 2018-03-19 DIAGNOSIS — E1151 Type 2 diabetes mellitus with diabetic peripheral angiopathy without gangrene: Secondary | ICD-10-CM | POA: Diagnosis not present

## 2018-03-19 DIAGNOSIS — I5033 Acute on chronic diastolic (congestive) heart failure: Secondary | ICD-10-CM | POA: Diagnosis not present

## 2018-03-19 DIAGNOSIS — D500 Iron deficiency anemia secondary to blood loss (chronic): Secondary | ICD-10-CM | POA: Diagnosis not present

## 2018-03-19 DIAGNOSIS — N170 Acute kidney failure with tubular necrosis: Secondary | ICD-10-CM | POA: Diagnosis not present

## 2018-03-19 DIAGNOSIS — I890 Lymphedema, not elsewhere classified: Secondary | ICD-10-CM | POA: Diagnosis not present

## 2018-03-19 DIAGNOSIS — E11621 Type 2 diabetes mellitus with foot ulcer: Secondary | ICD-10-CM | POA: Diagnosis not present

## 2018-03-19 DIAGNOSIS — Z89421 Acquired absence of other right toe(s): Secondary | ICD-10-CM | POA: Diagnosis not present

## 2018-03-19 DIAGNOSIS — L97514 Non-pressure chronic ulcer of other part of right foot with necrosis of bone: Secondary | ICD-10-CM | POA: Diagnosis not present

## 2018-03-22 ENCOUNTER — Other Ambulatory Visit: Payer: Self-pay

## 2018-03-22 ENCOUNTER — Encounter: Payer: PPO | Admitting: Occupational Therapy

## 2018-03-22 ENCOUNTER — Ambulatory Visit: Payer: Self-pay

## 2018-03-22 ENCOUNTER — Ambulatory Visit: Payer: PPO

## 2018-03-22 NOTE — Patient Outreach (Signed)
[facility]) Care Management  [place]  03/22/2018  Sharon [name] Jun 21, 1933 [id number]   Reason for call: post discharge medication review  Successful telephone call attempt.  Phone answered by a family member who requested that I call back tomorrow after 1 pm.  Plan:  Call tomorrow after 1 pm.  Joetta Manners, [place] [phone]

## 2018-03-23 ENCOUNTER — Other Ambulatory Visit: Payer: Self-pay

## 2018-03-23 ENCOUNTER — Encounter: Payer: PPO | Attending: Physician Assistant | Admitting: Physician Assistant

## 2018-03-23 ENCOUNTER — Encounter (INDEPENDENT_AMBULATORY_CARE_PROVIDER_SITE_OTHER): Payer: Self-pay | Admitting: Nurse Practitioner

## 2018-03-23 ENCOUNTER — Ambulatory Visit (INDEPENDENT_AMBULATORY_CARE_PROVIDER_SITE_OTHER): Payer: PPO | Admitting: Nurse Practitioner

## 2018-03-23 ENCOUNTER — Telehealth (INDEPENDENT_AMBULATORY_CARE_PROVIDER_SITE_OTHER): Payer: Self-pay | Admitting: Nurse Practitioner

## 2018-03-23 ENCOUNTER — Ambulatory Visit: Payer: Self-pay

## 2018-03-23 VITALS — BP 112/74 | HR 64 | Resp 16 | Wt 155.0 lb

## 2018-03-23 DIAGNOSIS — L97812 Non-pressure chronic ulcer of other part of right lower leg with fat layer exposed: Secondary | ICD-10-CM | POA: Insufficient documentation

## 2018-03-23 DIAGNOSIS — I890 Lymphedema, not elsewhere classified: Secondary | ICD-10-CM | POA: Diagnosis not present

## 2018-03-23 DIAGNOSIS — L97512 Non-pressure chronic ulcer of other part of right foot with fat layer exposed: Secondary | ICD-10-CM | POA: Insufficient documentation

## 2018-03-23 DIAGNOSIS — M069 Rheumatoid arthritis, unspecified: Secondary | ICD-10-CM | POA: Insufficient documentation

## 2018-03-23 DIAGNOSIS — Z888 Allergy status to other drugs, medicaments and biological substances status: Secondary | ICD-10-CM | POA: Insufficient documentation

## 2018-03-23 DIAGNOSIS — I5032 Chronic diastolic (congestive) heart failure: Secondary | ICD-10-CM

## 2018-03-23 DIAGNOSIS — Z79899 Other long term (current) drug therapy: Secondary | ICD-10-CM

## 2018-03-23 DIAGNOSIS — Z87891 Personal history of nicotine dependence: Secondary | ICD-10-CM | POA: Insufficient documentation

## 2018-03-23 DIAGNOSIS — E11649 Type 2 diabetes mellitus with hypoglycemia without coma: Secondary | ICD-10-CM | POA: Diagnosis not present

## 2018-03-23 DIAGNOSIS — I7025 Atherosclerosis of native arteries of other extremities with ulceration: Secondary | ICD-10-CM

## 2018-03-23 DIAGNOSIS — E1140 Type 2 diabetes mellitus with diabetic neuropathy, unspecified: Secondary | ICD-10-CM | POA: Insufficient documentation

## 2018-03-23 DIAGNOSIS — I4891 Unspecified atrial fibrillation: Secondary | ICD-10-CM | POA: Insufficient documentation

## 2018-03-23 DIAGNOSIS — L97822 Non-pressure chronic ulcer of other part of left lower leg with fat layer exposed: Secondary | ICD-10-CM | POA: Diagnosis not present

## 2018-03-23 DIAGNOSIS — I110 Hypertensive heart disease with heart failure: Secondary | ICD-10-CM | POA: Insufficient documentation

## 2018-03-23 DIAGNOSIS — L97522 Non-pressure chronic ulcer of other part of left foot with fat layer exposed: Secondary | ICD-10-CM | POA: Diagnosis not present

## 2018-03-23 DIAGNOSIS — Z823 Family history of stroke: Secondary | ICD-10-CM | POA: Insufficient documentation

## 2018-03-23 DIAGNOSIS — L97811 Non-pressure chronic ulcer of other part of right lower leg limited to breakdown of skin: Secondary | ICD-10-CM | POA: Diagnosis not present

## 2018-03-23 DIAGNOSIS — I10 Essential (primary) hypertension: Secondary | ICD-10-CM

## 2018-03-23 DIAGNOSIS — F502 Bulimia nervosa: Secondary | ICD-10-CM | POA: Insufficient documentation

## 2018-03-23 DIAGNOSIS — E11621 Type 2 diabetes mellitus with foot ulcer: Secondary | ICD-10-CM | POA: Insufficient documentation

## 2018-03-23 DIAGNOSIS — L97519 Non-pressure chronic ulcer of other part of right foot with unspecified severity: Secondary | ICD-10-CM | POA: Diagnosis not present

## 2018-03-23 DIAGNOSIS — I509 Heart failure, unspecified: Secondary | ICD-10-CM | POA: Insufficient documentation

## 2018-03-23 DIAGNOSIS — F419 Anxiety disorder, unspecified: Secondary | ICD-10-CM | POA: Insufficient documentation

## 2018-03-23 DIAGNOSIS — D649 Anemia, unspecified: Secondary | ICD-10-CM | POA: Insufficient documentation

## 2018-03-23 DIAGNOSIS — Z8249 Family history of ischemic heart disease and other diseases of the circulatory system: Secondary | ICD-10-CM | POA: Insufficient documentation

## 2018-03-23 NOTE — Telephone Encounter (Signed)
Per Arna Medici call [employer] and reiterate order for [employer]. Patient is to have Unna wraps changed 3 times a week unless the dressing is saturated then she wants the dressing changed if it is saturated. Worried if patient stays in saturated unna wraps she will have cellulitis.   Spoke with Cleon Gustin, Med tech. At [facility]. She is aware of this and verbalized understanding. Order sent with patient. No questions. AS, CMA

## 2018-03-23 NOTE — Patient Outreach (Signed)
[facility]) Care Management [place]  03/23/2018  Sharon [name] 1933/01/29 [id number]  Reason for referral: post discharge medication review  [facility] pharmacy case is being closed due to the following reasons:  Spoke with family member of home phone listed.  He states that he will call me when he has time to complete the medication review.  Patinet is having to go to frequent doctor's visits to get her medications adjusted.   Patient has been provided [facility] CM contact information if assistance needed in the future.    Thank you for allowing [facility] pharmacy to be involved in this patient's care.    Joetta Manners, PharmD [place] [phone]

## 2018-03-24 ENCOUNTER — Encounter: Payer: PPO | Admitting: Occupational Therapy

## 2018-03-24 ENCOUNTER — Encounter (INDEPENDENT_AMBULATORY_CARE_PROVIDER_SITE_OTHER): Payer: Self-pay | Admitting: Nurse Practitioner

## 2018-03-24 ENCOUNTER — Ambulatory Visit: Payer: PPO | Admitting: Physical Therapy

## 2018-03-24 DIAGNOSIS — I890 Lymphedema, not elsewhere classified: Secondary | ICD-10-CM | POA: Insufficient documentation

## 2018-03-24 DIAGNOSIS — E1151 Type 2 diabetes mellitus with diabetic peripheral angiopathy without gangrene: Secondary | ICD-10-CM | POA: Diagnosis not present

## 2018-03-24 DIAGNOSIS — L89152 Pressure ulcer of sacral region, stage 2: Secondary | ICD-10-CM | POA: Diagnosis not present

## 2018-03-24 DIAGNOSIS — I739 Peripheral vascular disease, unspecified: Secondary | ICD-10-CM | POA: Diagnosis not present

## 2018-03-24 DIAGNOSIS — I5033 Acute on chronic diastolic (congestive) heart failure: Secondary | ICD-10-CM | POA: Diagnosis not present

## 2018-03-24 NOTE — Progress Notes (Signed)
SUBJECTIVE:  Patient ID: Sharon [name], female    DOB: [birth year], 85 y.o.   MRN: [id number] Chief Complaint  Patient presents with  . Follow-up    ref Sharon [name] for lymphedema    HPI  Sharon [name] is a 85 y.o. female that presents today as a referral from Dr. Ammie Ferrier office with extreme swelling of her lower extremities.  The patient was recently admitted to [facility] concerning fluid volume overload status.  In the process of diuresing, the patient suffered acute kidney injury and diuresis was ceased.  On discharge the patient was placed back on 20 mg of Lasix.  Today, the patient presents following a visit with the wound center regarding her wounds.  The patient is profoundly edematous.  She is weeping from both lower extremities however the left is far worse than the right.  The left lower extremity also has multiple ulcerations on the dorsal surface as well as several on the calf.  There is also what appears to be a pressure ulcer on her left heel.  The patient denies any fever, chills, nausea, vomiting or diarrhea.  She denies any chest pain or shortness of breath.  The patient currently has home health through [facility], her nursing facility.  The patient also has some limited mobility also contributing to the swelling in her lower extremities.  Patient also has a previous history of multiple peripheral arterial disease interventions.  Her most recent intervention was done on 04/17/2017, with angiogram and intervention of the common iliac arteries  Patient also has a small scab-like area on her second digit of her left foot.  This appears to be more of a scab however it could be an early ulceration as well. Past Medical History:  Diagnosis Date  . Anemia   . Atrial fibrillation ([place])   . Cardiac arrest ([place])   . Cataract   . CHF (congestive heart failure) ([place])   . Diabetes mellitus without complication ([place])   . Edema    feet/ankles occas  . Hip fracture  ([place])   . Hyperlipidemia   . Hypertension   . Hypothyroid   . Neuropathy   . Osteomyelitis ([place])    left first metatarsal  . Stroke [facility])     Past Surgical History:  Procedure Laterality Date  . ACHILLES TENDON SURGERY Left 02/08/2015   Procedure: ACHILLES LENGTHENING/KIDNER;  Surgeon: Albertine Patricia, DPM;  Location: [facility] ORS;  Service: Podiatry;  Laterality: Left;  . APPENDECTOMY    . CARDIAC CATHETERIZATION  08/25/13  . CATARACT EXTRACTION W/PHACO Left 07/20/2014   Procedure: CATARACT EXTRACTION PHACO AND INTRAOCULAR LENS PLACEMENT (IOC);  Surgeon: Leandrew Koyanagi, MD;  Location: [facility] ORS;  Service: Ophthalmology;  Laterality: Left;  [place]  1:18                 AP     23.6             CDE   9.69      lot #[id number]  . CHOLECYSTECTOMY    . CORONARY ANGIOPLASTY    . HALLUX VALGUS AKIN Left 02/08/2015   Procedure: HALLUX VALGUS AKIN/ KELLER;  Surgeon: Albertine Patricia, DPM;  Location: [facility] ORS;  Service: Podiatry;  Laterality: Left;  IVA with Local needs 1 hour for this case   . HEMIARTHROPLASTY HIP Right   . HEMIARTHROPLASTY HIP Left   . INCISION AND DRAINAGE Left 10/11/2014   Procedure: Removal of infected tibial sessmoid;  Surgeon: Albertine Patricia, DPM;  Location: [facility] ORS;  Service: Podiatry;  Laterality: Left;  . IRRIGATION AND DEBRIDEMENT FOOT Right 03/21/2017   Procedure: IRRIGATION AND DEBRIDEMENT FOOT right great toe amputation;  Surgeon: Sharlotte Alamo, DPM;  Location: [facility] ORS;  Service: Podiatry;  Laterality: Right;  . IRRIGATION AND DEBRIDEMENT FOOT Right 04/18/2017   Procedure: IRRIGATION AND DEBRIDEMENT FOOT and application wound vac;  Surgeon: Albertine Patricia, DPM;  Location: [facility] ORS;  Service: Podiatry;  Laterality: Right;  . PERIPHERAL VASCULAR BALLOON ANGIOPLASTY Right 04/17/2017   Procedure: PERIPHERAL VASCULAR BALLOON ANGIOPLASTY;  Surgeon: Katha Cabal, MD;  Location: [place];  Service: Cardiovascular;  Laterality: Right;    Social History    Socioeconomic History  . Marital status: Widowed    Spouse name: Not on file  . Number of children: Not on file  . Years of education: Not on file  . Highest education level: Not on file  Occupational History  . Occupation: retired.  Social Needs  . Financial resource strain: Not hard at all  . Food insecurity:    Worry: Not on file    Inability: Not on file  . Transportation needs:    Medical: Not on file    Non-medical: Not on file  Tobacco Use  . Smoking status: Former Smoker    Packs/day: 0.50    Years: 7.00    Pack years: 3.50    Types: Cigarettes    Last attempt to quit: 05/30/1989    Years since quitting: 28.8  . Smokeless tobacco: Never Used  Substance and Sexual Activity  . Alcohol use: No    Alcohol/week: 0.0 standard drinks  . Drug use: No  . Sexual activity: Not on file  Lifestyle  . Physical activity:    Days per week: Not on file    Minutes per session: Not on file  . Stress: Not on file  Relationships  . Social connections:    Talks on phone: Not on file    Gets together: Not on file    Attends religious service: Not on file    Active member of club or organization: Not on file    Attends meetings of clubs or organizations: Not on file    Relationship status: Not on file  . Intimate partner violence:    Fear of current or ex partner: Not on file    Emotionally abused: Not on file    Physically abused: Not on file    Forced sexual activity: Not on file  Other Topics Concern  . Not on file  Social History Narrative   Lives at home by herself.   Ambulates with a walker    Family History  Problem Relation Age of Onset  . Heart attack Mother     Allergies  Allergen Reactions  . Carbamazepine Other (See Comments)    Reaction: unknown  . Cymbalta [Duloxetine Hcl] Other (See Comments)    Confusion, disorientation  . Duloxetine     Other reaction(s): Other (See Comments) Confusion, disorientation  . Lyrica [Pregabalin] Other (See Comments)     Patient and son states this makes the patient confused.  . Temazepam     Other reaction(s): Hallucination     Review of Systems   Review of Systems: Negative Unless Checked Constitutional: [] Weight loss  [] Fever  [] Chills Cardiac: [] Chest pain   []  Atrial Fibrillation  [] Palpitations   [] Shortness of breath when laying flat   [] Shortness of breath with exertion. [] Shortness of breath at rest Vascular:  []   Pain in legs with walking   [] Pain in legs with standing [] Pain in legs when laying flat   [] Claudication    [] Pain in feet when laying flat    [] History of DVT   [] Phlebitis   [x] Swelling in legs   [x] Varicose veins   [x] Non-healing ulcers Pulmonary:   [] Uses home oxygen   [] Productive cough   [] Hemoptysis   [] Wheeze  [] COPD   [] Asthma Neurologic:  [] Dizziness   [] Seizures  [] Blackouts [] History of stroke   [] History of TIA  [] Aphasia   [] Temporary Blindness   [] Weakness or numbness in arm   [x] Weakness or numbness in leg Musculoskeletal:   [] Joint swelling   [] Joint pain   [] Low back pain  []  History of Knee Replacement [] Arthritis [] back Surgeries  []  Spinal Stenosis    Hematologic:  [] Easy bruising  [] Easy bleeding   [] Hypercoagulable state   [] Anemic Gastrointestinal:  [] Diarrhea   [] Vomiting  [] Gastroesophageal reflux/heartburn   [] Difficulty swallowing. [] Abdominal pain Genitourinary:  [x] Chronic kidney disease   [] Difficult urination  [] Anuric   [] Blood in urine [] Frequent urination  [] Burning with urination   [] Hematuria Skin:  [] Rashes   [x] Ulcers [x] Wounds Psychological:  [] History of anxiety   []  History of major depression  []  Memory Difficulties      OBJECTIVE:   Physical Exam  BP 112/74 (BP Location: Left Arm)   Pulse 64   Resp 16   Wt 155 lb (70.3 kg)   BMI 25.79 kg/m   Gen: WD/WN, NAD Head: [place]/AT, No temporalis wasting.  Ear/Nose/Throat: Hearing grossly intact, nares w/o erythema or drainage Eyes: PER, EOMI, sclera nonicteric.  Neck: Supple, no masses.  No JVD.   Pulmonary:  Good air movement, no use of accessory muscles.  Cardiac: RRR Vascular:  Unable to palpate pulses due to swelling.  Legs bilaterally 4+ edema pitting. Vessel Right Left  Radial Palpable Palpable   Gastrointestinal: soft, non-distended. No guarding/no peritoneal signs.  Musculoskeletal: M/S 5/5 throughout.  No deformity or atrophy.  Neurologic: Pain and light touch intact in extremities.  Symmetrical.  Speech is fluent. Motor exam as listed above. Psychiatric: Judgment intact, Mood & affect appropriate for pt's clinical situation. Dermatologic:  Patient has multiple ulcerations on her left lower extremity.  Several on the dorsal surface of her foot.  Several on her calf.  There is also a questionable diabetic ulcer on her heelhowever this could also be pressure ulcer from her recent hospital stay. No changes consistent with cellulitis. Lymph : No Cervical lymphadenopathy, dermal thickening bilaterally       ASSESSMENT AND PLAN:  1. Essential hypertension Continue antihypertensive medications as already ordered, these medications have been reviewed and there are no changes at this time.   2. Chronic diastolic heart failure (HCC) The patient's most recent exacerbation has likely cause this state of swelling of her lower extremities.  I stressed with the son and other family member that was present that compression wraps on her lower extremity are only going to fix a portion of this problem.  Without adequate fluid balance, her legs will continue to weep.  He was strongly urged to follow-up with her primary care physician in order to figure out the best way to titrate medications to help alleviate the additional fluid that may still remain.  3. Uncontrolled type 2 diabetes mellitus with hypoglycemia without coma ([place]) Continue hypoglycemic medications as already ordered, these medications have been reviewed and there are no changes at this time.  Hgb A1C to be monitored  as already  arranged by primary service   4. Lymphedema Recommend:  No surgery or intervention at this point in time.    Currently, the patient will be placed in bilateral 3 layer zinc oxide compression wraps to be changed by her facility at least 3 times a week.  I have sent strict instructions to change if her wraps become saturated while weeping even if it is greater than 3 times per week the specific instruction was sent because leaving wet wraps will increase the likelihood that she may develop cellulitis.  I have reviewed my previous discussion with the patient regarding swelling and why it causes symptoms.  Patient will transition to wearing graduated compression stockings class 1 (20-30 mmHg) on a daily basis following compression wraps.  The patient will  beginning wearing the stockings first thing in the morning and removing them in the evening. The patient is instructed specifically not to sleep in the stockings.    In addition, behavioral modification including several periods of elevation of the lower extremities during the day will be continued.    The patient will also continue routine exercise, especially walking on a daily basis if able.  Despite conservative treatments including graduated compression therapy class 1 and behavioral modification including exercise and elevation the patient  has not obtained adequate control of the lymphedema.  The patient still has stage 3 lymphedema and therefore, I believe that a lymph pump should be added to improve the control of the patient's lymphedema.  Additionally, a lymph pump is warranted because it will reduce the risk of cellulitis and ulceration in the future.   5. Atherosclerosis of native arteries of the extremities with ulceration ([place]) Patient's previous history of ulceration peripheral artery disease is concerning due to the nature and location of several of these wounds.  He certainly can be attributed to solely venous disease however the  location indicates that there could be arterial involvement.  We will check an ABI with the patient returns in 2 weeks for assessment of her legs with the compression wraps and determine if further intervention is necessary.       Current Outpatient Medications on File Prior to Visit  Medication Sig Dispense Refill  . acetaminophen (TYLENOL) 500 MG tablet Take 1,000 mg by mouth at bedtime. For pain management    . acetaminophen (TYLENOL) 650 MG CR tablet Take 650 mg by mouth every 8 (eight) hours as needed. For discomfort    . Amino Acids-Protein Hydrolys (FEEDING SUPPLEMENT, PRO-STAT SUGAR FREE 64,) LIQD Take 30 mLs by mouth 2 (two) times daily between meals.    Marland Kitchen amiodarone (PACERONE) 200 MG tablet Take 1 tablet (200 mg total) by mouth daily. 30 tablet 2  . amitriptyline (ELAVIL) 10 MG tablet Take 10 mg by mouth daily.     Marland Kitchen atorvastatin (LIPITOR) 10 MG tablet Take 10 mg by mouth daily at 10 pm.     . cholecalciferol (VITAMIN D) 1000 units tablet Take 1,000 Units by mouth daily.    . collagenase (SANTYL) ointment Apply topically daily. 15 g 0  . ferrous sulfate 325 (65 FE) MG tablet Take 325 mg by mouth daily.     . furosemide (LASIX) 20 MG tablet     . gabapentin (NEURONTIN) 100 MG capsule Take 200 mg by mouth 3 (three) times daily.     Marland Kitchen glimepiride (AMARYL) 4 MG tablet Take 4 mg by mouth daily.     Marland Kitchen GLUCERNA (GLUCERNA) LIQD Take 237 mLs by  mouth 2 (two) times daily between meals.    Marland Kitchen JANUVIA 50 MG tablet Take 50 mg by mouth daily. Take along with Amaryl    . levothyroxine (SYNTHROID, LEVOTHROID) 112 MCG tablet Take 112 mcg by mouth daily before breakfast. 30 minutes before breakfast    . metoprolol tartrate (LOPRESSOR) 25 MG tablet Take 1 tablet (25 mg total) by mouth 2 (two) times daily. 60 tablet 0  . mupirocin ointment (BACTROBAN) 2 % Place 1 application into the nose 2 (two) times daily.    . traMADol (ULTRAM) 50 MG tablet Take 1 tablet (50 mg total) by mouth every 6 (six) hours  as needed. 20 tablet 0  . traZODone (DESYREL) 50 MG tablet Take 50 mg by mouth at bedtime. For anxiety    . vitamin C (ASCORBIC ACID) 500 MG tablet Take 500 mg by mouth daily.     . bisacodyl (DULCOLAX) 5 MG EC tablet Take 2 tablets (10 mg total) by mouth every other day. (Patient not taking: Reported on 03/09/2018) 30 tablet 0  . senna-docusate (SENOKOT-S) 8.6-50 MG tablet Take 1 tablet by mouth 2 (two) times daily. (Patient not taking: Reported on 03/09/2018) 30 tablet 2   Current Facility-Administered Medications on File Prior to Visit  Medication Dose Route Frequency Provider Last Rate Last Dose  . sodium chloride flush (NS) 0.9 % injection 10 mL  10 mL Intracatheter PRN Karen Kitchens, NP        There are no Patient Instructions on file for this visit. No follow-ups on file.   Kris Hartmann, NP  This note was completed with Sales executive.  Any errors are purely unintentional.

## 2018-03-24 NOTE — Progress Notes (Signed)
Sharon [name], Sharon [name] ([id number]) Visit Report for 03/23/2018 Abuse/Suicide Risk Screen Details Patient Name: Sharon [name], [name] Date of Service: 03/23/2018 10:15 AM Medical Record Number: [id number] Patient Account Number: [id number] Date of Birth/Sex: 12-19-1933 (85 y.o. F) Treating RN: Army Melia Primary Care Roneka Gilpin: Emily Filbert Other Clinician: Referring Sevyn Markham: Jannifer Franklin, DAVID Treating Shailey Butterbaugh/Extender: Melburn Hake, HOYT Weeks in Treatment: 0 Abuse/Suicide Risk Screen Items Answer ABUSE/SUICIDE RISK SCREEN: Has anyone close to you tried to hurt or harm you recentlyo No Do you feel uncomfortable with anyone in your familyo No Has anyone forced you do things that you didnot want to doo No Do you have any thoughts of harming yourselfo No Patient displays signs or symptoms of abuse and/or neglect. No Electronic Signature(s) Signed: 03/23/2018 4:39:27 PM By: Army Melia Entered By: Army Melia on 03/23/2018 11:25:01 Hynek, Alvenia Sharon Nick ([id number]) -------------------------------------------------------------------------------- Activities of Daily Living Details Patient Name: Sharon [name], [name] Date of Service: 03/23/2018 10:15 AM Medical Record Number: [id number] Patient Account Number: [id number] Date of Birth/Sex: 01/24/1933 (85 y.o. F) Treating RN: Army Melia Primary Care Kyleena Scheirer: Emily Filbert Other Clinician: Referring Ardena Gangl: Jannifer Franklin, DAVID Treating Buck Mcaffee/Extender: Melburn Hake, HOYT Weeks in Treatment: 0 Activities of Daily Living Items Answer Activities of Daily Living (Please select one for each item) Drive Automobile Not Able Take Medications Completely Able Use Telephone Completely Able Care for Appearance Completely Able Use Toilet Completely Able Bath / Shower Need Assistance Dress Self Need Assistance Feed Self Completely Able Walk Need Assistance Get In / Out Bed Need Assistance Housework Need Assistance Prepare Meals Need Assistance Handle Money Completely  Able Shop for Self Need Assistance Electronic Signature(s) Signed: 03/23/2018 4:39:27 PM By: Army Melia Entered By: Army Melia on 03/23/2018 11:25:36 Stiger, [place] ([id number]) -------------------------------------------------------------------------------- Education Assessment Details Patient Name: Sharon [name], [name] Date of Service: 03/23/2018 10:15 AM Medical Record Number: [id number] Patient Account Number: [id number] Date of Birth/Sex: 30-Apr-1933 (85 y.o. F) Treating RN: Army Melia Primary Care Rosangela Fehrenbach: Emily Filbert Other Clinician: Referring Tashiana Lamarca: Jannifer Franklin, DAVID Treating Terra Aveni/Extender: Sharalyn Ink in Treatment: 0 Primary Learner Assessed: Patient Learning Preferences/Education Level/Primary Language Learning Preference: Explanation, Demonstration Highest Education Level: High School Preferred Language: English Cognitive Barrier Assessment/Beliefs Language Barrier: No Translator Needed: No Memory Deficit: No Emotional Barrier: No Cultural/Religious Beliefs Affecting Medical Care: No Physical Barrier Assessment 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 Impaired Hearing: No Decreased Hand dexterity: No Knowledge/Comprehension Assessment Knowledge Level: High Comprehension Level: High Ability to understand written High instructions: Ability to understand verbal High instructions: Motivation Assessment Anxiety Level: Calm Cooperation: Cooperative Education Importance: Acknowledges Need Interest in Health Problems: Asks Questions Perception: Coherent Willingness to Engage in Self- High Management Activities: Readiness to Engage in Self- High Management Activities: Electronic Signature(s) Signed: 03/23/2018 4:39:27 PM By: Army Melia Entered By: Army Melia on 03/23/2018 11:26:18 Sharon [name], Sharon Sharon Nick ([id number]) -------------------------------------------------------------------------------- Fall Risk Assessment Details Patient Name:  Sharon [name], [name] Date of Service: 03/23/2018 10:15 AM Medical Record Number: [id number] Patient Account Number: [id number] Date of Birth/Sex: 09-Oct-1933 (85 y.o. F) Treating RN: Army Melia Primary Care Rye Dorado: Emily Filbert Other Clinician: Referring Jewell Haught: Jannifer Franklin, DAVID Treating Vernesha Talbot/Extender: Melburn Hake, HOYT Weeks in Treatment: 0 Fall Risk Assessment Items Have you had 2 or more falls in the last 12 monthso 0 Yes Have you had any fall that resulted in injury in the last 12 monthso 0 No FALL RISK ASSESSMENT: History of falling - immediate or within 3 months 25 Yes Secondary diagnosis 0 No Ambulatory aid None/bed rest/wheelchair/nurse 0 Yes Crutches/cane/walker  0 No Furniture 0 No IV Access/Saline Lock 0 No Gait/Training Normal/bed rest/immobile 0 No Weak 10 Yes Impaired 0 No Mental Status Oriented to own ability 0 Yes Electronic Signature(s) Signed: 03/23/2018 4:39:27 PM By: Army Melia Entered By: Army Melia on 03/23/2018 11:27:17 Sharon [name], [name] ([id number]) -------------------------------------------------------------------------------- Foot Assessment Details Patient Name: Sharon [name], [name] Date of Service: 03/23/2018 10:15 AM Medical Record Number: [id number] Patient Account Number: [id number] Date of Birth/Sex: Dec 15, 1933 (85 y.o. F) Treating RN: Army Melia Primary Care Deaunna Olarte: Emily Filbert Other Clinician: Referring Equilla Que: Jannifer Franklin, DAVID Treating Lourene Hoston/Extender: Melburn Hake, HOYT Weeks in Treatment: 0 Foot Assessment Items Site Locations + = Sensation present, - = Sensation absent, C = Callus, U = Ulcer R = Redness, W = Warmth, M = Maceration, PU = Pre-ulcerative lesion F = Fissure, S = Swelling, D = Dryness Assessment Right: Left: Other Deformity: No No Prior Foot Ulcer: No No Prior Amputation: No No Charcot Joint: No No Ambulatory Status: Gait: Electronic Signature(s) Signed: 03/23/2018 4:39:27 PM By: Army Melia Entered By: Army Melia on 03/23/2018 11:31:39 Sharon [name], Sharon [name] Sharon Nick ([id number]) -------------------------------------------------------------------------------- Nutrition Risk Assessment Details Patient Name: Sharon [name], [name] Date of Service: 03/23/2018 10:15 AM Medical Record Number: [id number] Patient Account Number: [id number] Date of Birth/Sex: 1933-12-25 (85 y.o. F) Treating RN: Army Melia Primary Care Jaira Canady: Emily Filbert Other Clinician: Referring Meggen Spaziani: Jannifer Franklin, DAVID Treating Jennifer Holland/Extender: Melburn Hake, HOYT Weeks in Treatment: 0 Height (in): 65 Weight (lbs): 155 Body Mass Index (BMI): 25.8 Nutrition Risk Assessment Items NUTRITION RISK SCREEN: I have an illness or condition that made me change the kind and/or amount of 0 No food I eat I eat fewer than two meals per day 0 No I eat few fruits and vegetables, or milk products 0 No I have three or more drinks of beer, liquor or wine almost every day 0 No I have tooth or mouth problems that make it hard for me to eat 0 No I don't always have enough money to buy the food I need 0 No I eat alone most of the time 0 No I take three or more different prescribed or over-the-counter drugs a day 0 No Without wanting to, I have lost or gained 10 pounds in the last six months 0 No I am not always physically able to shop, cook and/or feed myself 0 No Nutrition Protocols Good Risk Protocol Moderate Risk Protocol Electronic Signature(s) Signed: 03/23/2018 4:39:27 PM By: Army Melia Entered By: Army Melia on 03/23/2018 11:27:22

## 2018-03-25 NOTE — Progress Notes (Signed)
YENESIS, EVEN ([id number]) Visit Report for 03/23/2018 Chief Complaint Document Details Patient Name: Sharon [name], [name] Date of Service: 03/23/2018 10:15 AM Medical Record Number: [id number] Patient Account Number: [id number] Date of Birth/Sex: 1933-09-19 (85 y.o. F) Treating RN: Montey Hora Primary Care Provider: Emily Filbert Other Clinician: Referring Provider: Jannifer Franklin, DAVID Treating Provider/Extender: Melburn Hake, Aaliyah Cancro Weeks in Treatment: 0 Information Obtained from: Patient Chief Complaint Multiple bilateral LE ulcers Electronic Signature(s) Signed: 03/24/2018 1:14:32 PM By: Worthy Keeler PA-C Entered By: Worthy Keeler on 03/24/2018 13:11:23 Hagg, Afrika P. ([id number]) -------------------------------------------------------------------------------- HPI Details Patient Name: Sharon [name], [name] Date of Service: 03/23/2018 10:15 AM Medical Record Number: [id number] Patient Account Number: [id number] Date of Birth/Sex: 1933/03/16 (85 y.o. F) Treating RN: Montey Hora Primary Care Provider: Emily Filbert Other Clinician: Referring Provider: Jannifer Franklin, DAVID Treating Provider/Extender: Melburn Hake, Candler Ginsberg Weeks in Treatment: 0 History of Present Illness HPI Description: 03/23/18 On evaluation today patient presents for initial evaluation our clinic concerning issues that she's been having with the bilateral lower extremities. She has been out of the hospital for about a week and was there for about four days due to fluid overload. She does have congestive heart failure, atrial fibrillation, diabetes mellitus type II, and a right first toe amputation. Currently she is having bilateral lower extremity weeping and swelling. During her time in the hospital that she took her off of her diuretic medications according to what her son is telling me today. With that being said her primary care provider, Dr. Sabra Heck, has placed her back on some of these medications according to what her son is  telling me. She has undergone previous testing including hemoglobin A1c of 7.6 on 01/11/18. She's also had arterial studies which show that her right ABI was 0.89 and the left ABI of 0.94 this obviously is good as well. Currently the biggest concern with both the patient and her son are issues with overall general fluid overload and the fact that just wrapping her legs or managing the legs as we would do here in the clinic will not treat the overall fluid overload issue which I completely agree with. With that being said I think that we definitely need to try to get these areas closed as quickly as possible in order to prevent any chance for infection/cellulitis. Electronic Signature(s) Signed: 03/24/2018 1:14:32 PM By: Worthy Keeler PA-C Entered By: Worthy Keeler on 03/24/2018 13:11:44 Platz, [place] ([id number]) -------------------------------------------------------------------------------- Physical Exam Details Patient Name: Sharon [name], [name] Date of Service: 03/23/2018 10:15 AM Medical Record Number: [id number] Patient Account Number: [id number] Date of Birth/Sex: [birth year] (85 y.o. F) Treating RN: Montey Hora Primary Care Provider: Emily Filbert Other Clinician: Referring Provider: Jannifer Franklin, DAVID Treating Provider/Extender: Melburn Hake, Traxton Kolenda Weeks in Treatment: 0 Constitutional sitting or standing blood pressure is within target range for patient.. pulse regular and within target range for patient.Marland Kitchen respirations regular, non-labored and within target range for patient.Marland Kitchen temperature within target range for patient.. Well- nourished and well-hydrated in no acute distress. Eyes conjunctiva clear no eyelid edema noted. pupils equal round and reactive to light and accommodation. Ears, Nose, Mouth, and Throat no gross abnormality of ear auricles or external auditory canals. normal hearing noted during conversation. mucus membranes moist. Respiratory normal breathing without  difficulty. clear to auscultation bilaterally. Cardiovascular regular rate and rhythm with normal S1, S2. no clubbing, cyanosis, significant edema, <3 sec cap refill. Gastrointestinal (GI) soft, non-tender, non-distended, +BS. no ventral hernia noted. Musculoskeletal Patient unable to walk without assistance. no  significant deformity or arthritic changes, no loss or range of motion, no clubbing. Psychiatric this patient is able to make decisions and demonstrates good insight into disease process. Alert and Oriented x 3. pleasant and cooperative. Notes Patient's wound bed currently shows evidence breakdown and weeping of the bilateral lower extremities at multiple locations. There is no evidence or need for sharp debridement at this point. With that being said I do believe that the patient would benefit from compression therapy for lower extremities based on what I'm seeing at this time. Electronic Signature(s) Signed: 03/24/2018 1:14:32 PM By: Worthy Keeler PA-C Entered By: Worthy Keeler on 03/24/2018 13:12:16 Repka, [place] ([id number]) -------------------------------------------------------------------------------- Physician Orders Details Patient Name: Sharon [name], [name] Date of Service: 03/23/2018 10:15 AM Medical Record Number: [id number] Patient Account Number: [id number] Date of Birth/Sex: Sep 01, 1933 (85 y.o. F) Treating RN: Montey Hora Primary Care Provider: Emily Filbert Other Clinician: Referring Provider: Jannifer Franklin, DAVID Treating Provider/Extender: Melburn Hake, Kasir Hallenbeck Weeks in Treatment: 0 Verbal / Phone Orders: No Diagnosis Coding ICD-10 Coding Code Description E11.621 Type 2 diabetes mellitus with foot ulcer I89.0 Lymphedema, not elsewhere classified L97.822 Non-pressure chronic ulcer of other part of left lower leg with fat layer exposed L97.812 Non-pressure chronic ulcer of other part of right lower leg with fat layer exposed L97.512 Non-pressure chronic ulcer of  other part of right foot with fat layer exposed L97.522 Non-pressure chronic ulcer of other part of left foot with fat layer exposed I73.89 Other specified peripheral vascular diseases Wound Cleansing Wound #1 Left,Dorsal Foot o Cleanse wound with mild soap and water Wound #2 Left,Dorsal Toe Second o Cleanse wound with mild soap and water Wound #3 Left,Anterior Lower Leg o Cleanse wound with mild soap and water Wound #4 Right,Anterior Lower Leg o Cleanse wound with mild soap and water Wound #5 Right,Lateral Lower Leg o Cleanse wound with mild soap and water Wound #6 Left Calcaneus o Cleanse wound with mild soap and water Wound #7 Right Calcaneus o Cleanse wound with mild soap and water Primary Wound Dressing Wound #1 Left,Dorsal Foot o Silver Alginate Wound #2 Left,Dorsal Toe Second o Silver Alginate Wound #3 Left,Anterior Lower Leg o Silver Alginate Wound #4 Right,Anterior Lower Leg Mottola, Noya P. ([id number]) o Silver Alginate Wound #5 Right,Lateral Lower Leg o Silver Alginate Wound #6 Left Calcaneus o Silver Alginate Wound #7 Right Calcaneus o Silver Alginate Secondary Dressing Wound #1 Left,Dorsal Foot o ABD pad Wound #2 Left,Dorsal Toe Second o ABD pad Wound #3 Left,Anterior Lower Leg o ABD pad Wound #4 Right,Anterior Lower Leg o ABD pad Wound #5 Right,Lateral Lower Leg o ABD pad Wound #6 Left Calcaneus o ABD pad Wound #7 Right Calcaneus o ABD pad Dressing Change Frequency Wound #1 Left,Dorsal Foot o Three times weekly Wound #2 Left,Dorsal Toe Second o Three times weekly Wound #3 Left,Anterior Lower Leg o Three times weekly Wound #4 Right,Anterior Lower Leg o Three times weekly Wound #5 Right,Lateral Lower Leg o Three times weekly Wound #6 Left Calcaneus o Three times weekly Wound #7 Right Calcaneus o Three times weekly Follow-up Appointments Fildes, Jillianna P. ([id number]) Wound #1  Left,Dorsal Foot o Return Appointment in 1 week. Wound #2 Left,Dorsal Toe Second o Return Appointment in 1 week. Wound #3 Left,Anterior Lower Leg o Return Appointment in 1 week. Wound #4 Right,Anterior Lower Leg o Return Appointment in 1 week. Wound #5 Right,Lateral Lower Leg o Return Appointment in 1 week. Wound #6 Left Calcaneus o Return Appointment in 1 week. Wound #7 Right  Calcaneus o Return Appointment in 1 week. Edema Control Wound #1 Left,Dorsal Foot o 3 Layer Compression System - Bilateral - Please wrap from toes to 3cm below the knee Wound #2 Left,Dorsal Toe Second o 3 Layer Compression System - Bilateral - Please wrap from toes to 3cm below the knee Wound #3 Left,Anterior Lower Leg o 3 Layer Compression System - Bilateral - Please wrap from toes to 3cm below the knee Wound #4 Right,Anterior Lower Leg o 3 Layer Compression System - Bilateral - Please wrap from toes to 3cm below the knee Wound #5 Right,Lateral Lower Leg o 3 Layer Compression System - Bilateral - Please wrap from toes to 3cm below the knee Wound #6 Left Calcaneus o 3 Layer Compression System - Bilateral - Please wrap from toes to 3cm below the knee Wound #7 Right Calcaneus o 3 Layer Compression System - Bilateral - Please wrap from toes to 3cm below the knee [place] #1 [place] Visits - [place] Nurse may visit PRN to address patientos wound care needs. o FACE TO FACE ENCOUNTER: MEDICARE and MEDICAID PATIENTS: I certify that this patient is under my care and that I had a face-to-face encounter that meets the physician face-to-face encounter requirements with this patient on this date. The encounter with the patient was in whole or in part for the following MEDICAL CONDITION: (primary reason for [place]) MEDICAL NECESSITY: I certify, that based on my findings, NURSING services are a medically necessary home  health service. HOME BOUND STATUS: I certify that my clinical findings support that this patient is homebound (i.e., Due to illness or injury, pt requires aid of supportive devices such as crutches, cane, wheelchairs, walkers, the use of special transportation or the assistance of another person to leave their place of residence. There is a normal inability to leave the home and doing so requires considerable and taxing effort. Other absences are for medical reasons / religious services and are infrequent or of short duration when for other reasons). Misener, Jacey P. ([id number]) o If current dressing causes regression in wound condition, may D/C ordered dressing product/s and apply Normal Saline Moist Dressing daily until next [place] / Other MD appointment. [place] of regression in wound condition at [phone]. o Please direct any NON-WOUND related issues/requests for orders to patient's Primary Care Physician Wound #2 [place] Visits - [place] Nurse may visit PRN to address patientos wound care needs. o FACE TO FACE ENCOUNTER: MEDICARE and MEDICAID PATIENTS: I certify that this patient is under my care and that I had a face-to-face encounter that meets the physician face-to-face encounter requirements with this patient on this date. The encounter with the patient was in whole or in part for the following MEDICAL CONDITION: (primary reason for [place]) MEDICAL NECESSITY: I certify, that based on my findings, NURSING services are a medically necessary home health service. HOME BOUND STATUS: I certify that my clinical findings support that this patient is homebound (i.e., Due to illness or injury, pt requires aid of supportive devices such as crutches, cane, wheelchairs, walkers, the use of special transportation or the assistance of another person to leave their place of residence. There  is a normal inability to leave the home and doing so requires considerable and taxing effort. Other absences are for medical reasons / religious services and are infrequent or of short duration when for other reasons).   o If current dressing causes regression in wound condition, may D/C ordered dressing product/s and apply Normal Saline Moist Dressing daily until next [place] / Other MD appointment. [place] of regression in wound condition at [phone]. o Please direct any NON-WOUND related issues/requests for orders to patient's Primary Care Physician Wound #3 [place] Visits - [place] Nurse may visit PRN to address patientos wound care needs. o FACE TO FACE ENCOUNTER: MEDICARE and MEDICAID PATIENTS: I certify that this patient is under my care and that I had a face-to-face encounter that meets the physician face-to-face encounter requirements with this patient on this date. The encounter with the patient was in whole or in part for the following MEDICAL CONDITION: (primary reason for [place]) MEDICAL NECESSITY: I certify, that based on my findings, NURSING services are a medically necessary home health service. HOME BOUND STATUS: I certify that my clinical findings support that this patient is homebound (i.e., Due to illness or injury, pt requires aid of supportive devices such as crutches, cane, wheelchairs, walkers, the use of special transportation or the assistance of another person to leave their place of residence. There is a normal inability to leave the home and doing so requires considerable and taxing effort. Other absences are for medical reasons / religious services and are infrequent or of short duration when for other reasons). o If current dressing causes regression in wound condition, may D/C ordered dressing product/s and apply Normal Saline Moist Dressing daily until  next [place] / Other MD appointment. [place] of regression in wound condition at [phone]. o Please direct any NON-WOUND related issues/requests for orders to patient's Primary Care Physician Wound #4 [place] Visits - [place] Nurse may visit PRN to address patientos wound care needs. o FACE TO FACE ENCOUNTER: MEDICARE and MEDICAID PATIENTS: I certify that this patient is under my care and that I had a face-to-face encounter that meets the physician face-to-face encounter requirements with this patient on this date. The encounter with the patient was in whole or in part for the following MEDICAL CONDITION: (primary reason for [place]) MEDICAL NECESSITY: I certify, that based on my findings, NURSING services are a medically necessary home health service. HOME BOUND STATUS: I certify that my clinical findings support that this patient is homebound (i.e., Due to illness or injury, pt requires aid of supportive devices such as crutches, cane, wheelchairs, walkers, the use of special transportation or the assistance of another person to leave their place of residence. There is a normal inability to leave the home and doing so requires considerable and taxing effort. Other absences are for medical reasons / religious services and are infrequent or of short duration when for other reasons). o If current dressing causes regression in wound condition, may D/C ordered dressing product/s and apply Normal Saline Moist Dressing daily until next [place] / Other MD appointment. [place] of regression in wound condition at [phone]. o Please direct any NON-WOUND related issues/requests for orders to patient's Primary Care Physician LUVENA, WENTLING ([id number]) Wound #5 Right,Lateral Lower Leg o [place] Visits - [place] Nurse may  visit PRN to address patientos wound care needs. o FACE TO FACE ENCOUNTER: MEDICARE and MEDICAID PATIENTS: I certify that this patient is under my care and that I had a face-to-face encounter that  meets the physician face-to-face encounter requirements with this patient on this date. The encounter with the patient was in whole or in part for the following MEDICAL CONDITION: (primary reason for [place]) MEDICAL NECESSITY: I certify, that based on my findings, NURSING services are a medically necessary home health service. HOME BOUND STATUS: I certify that my clinical findings support that this patient is homebound (i.e., Due to illness or injury, pt requires aid of supportive devices such as crutches, cane, wheelchairs, walkers, the use of special transportation or the assistance of another person to leave their place of residence. There is a normal inability to leave the home and doing so requires considerable and taxing effort. Other absences are for medical reasons / religious services and are infrequent or of short duration when for other reasons). o If current dressing causes regression in wound condition, may D/C ordered dressing product/s and apply Normal Saline Moist Dressing daily until next [place] / Other MD appointment. [place] of regression in wound condition at [phone]. o Please direct any NON-WOUND related issues/requests for orders to patient's Primary Care Physician Wound #6 Left Calcaneus o [place] Visits - [place] Nurse may visit PRN to address patientos wound care needs. o FACE TO FACE ENCOUNTER: MEDICARE and MEDICAID PATIENTS: I certify that this patient is under my care and that I had a face-to-face encounter that meets the physician face-to-face encounter requirements with this patient on this date. The encounter with the patient was in whole or in part for the following  MEDICAL CONDITION: (primary reason for [place]) MEDICAL NECESSITY: I certify, that based on my findings, NURSING services are a medically necessary home health service. HOME BOUND STATUS: I certify that my clinical findings support that this patient is homebound (i.e., Due to illness or injury, pt requires aid of supportive devices such as crutches, cane, wheelchairs, walkers, the use of special transportation or the assistance of another person to leave their place of residence. There is a normal inability to leave the home and doing so requires considerable and taxing effort. Other absences are for medical reasons / religious services and are infrequent or of short duration when for other reasons). o If current dressing causes regression in wound condition, may D/C ordered dressing product/s and apply Normal Saline Moist Dressing daily until next [place] / Other MD appointment. [place] of regression in wound condition at [phone]. o Please direct any NON-WOUND related issues/requests for orders to patient's Primary Care Physician Wound #7 Right Calcaneus o [place] Visits - [place] Nurse may visit PRN to address patientos wound care needs. o FACE TO FACE ENCOUNTER: MEDICARE and MEDICAID PATIENTS: I certify that this patient is under my care and that I had a face-to-face encounter that meets the physician face-to-face encounter requirements with this patient on this date. The encounter with the patient was in whole or in part for the following MEDICAL CONDITION: (primary reason for [place]) MEDICAL NECESSITY: I certify, that based on my findings, NURSING services are a medically necessary home health service. HOME BOUND STATUS: I certify that my clinical findings support that this patient is homebound (i.e., Due to illness or injury, pt requires aid of supportive devices such as crutches, cane,  wheelchairs, walkers, the use of special transportation or the assistance of another person to leave their place of residence. There is a normal inability to leave the home and doing  so requires considerable and taxing effort. Other absences are for medical reasons / religious services and are infrequent or of short duration when for other reasons). o If current dressing causes regression in wound condition, may D/C ordered dressing product/s and apply Normal Saline Moist Dressing daily until next [place] / Other MD appointment. [place] of regression in wound condition at [phone]. o Please direct any NON-WOUND related issues/requests for orders to patient's Primary Care Physician Electronic Signature(s) Signed: 03/24/2018 1:14:32 PM By: Irean Hong Rahl, Alfhild P. ([id number]) Signed: 03/24/2018 4:23:17 PM By: Montey Hora Entered By: Montey Hora on 03/23/2018 12:28:46 Coppolino, Fayne Mamie Nick ([id number]) -------------------------------------------------------------------------------- Problem List Details Patient Name: Sharon [name], [name] Date of Service: 03/23/2018 10:15 AM Medical Record Number: [id number] Patient Account Number: [id number] Date of Birth/Sex: Jun 09, 1933 (85 y.o. F) Treating RN: Montey Hora Primary Care Provider: Emily Filbert Other Clinician: Referring Provider: Jannifer Franklin, DAVID Treating Provider/Extender: Melburn Hake, Tamzin Bertling Weeks in Treatment: 0 Active Problems ICD-10 Evaluated Encounter Code Description Active Date Today Diagnosis E11.621 Type 2 diabetes mellitus with foot ulcer 03/23/2018 No Yes I89.0 Lymphedema, not elsewhere classified 03/23/2018 No Yes L97.822 Non-pressure chronic ulcer of other part of left lower leg with 03/23/2018 No Yes fat layer exposed L97.812 Non-pressure chronic ulcer of other part of right lower leg 03/23/2018 No Yes with fat layer exposed L97.512 Non-pressure chronic ulcer of other part of  right foot with fat 03/23/2018 No Yes layer exposed L97.522 Non-pressure chronic ulcer of other part of left foot with fat 03/23/2018 No Yes layer exposed I73.89 Other specified peripheral vascular diseases 03/23/2018 No Yes Inactive Problems Resolved Problems Electronic Signature(s) Signed: 03/24/2018 1:14:32 PM By: Worthy Keeler PA-C Entered By: Worthy Keeler on 03/23/2018 12:05:14 Louischarles, Pavneet P. ([id number]) [place], Leveda Mamie Nick ([id number]) -------------------------------------------------------------------------------- Progress Note Details Patient Name: Sharon [name], [name] Date of Service: 03/23/2018 10:15 AM Medical Record Number: [id number] Patient Account Number: [id number] Date of Birth/Sex: 1933/06/28 (85 y.o. F) Treating RN: Montey Hora Primary Care Provider: Emily Filbert Other Clinician: Referring Provider: Jannifer Franklin, DAVID Treating Provider/Extender: Melburn Hake, Jonas Goh Weeks in Treatment: 0 Subjective Chief Complaint Information obtained from Patient Multiple bilateral LE ulcers History of Present Illness (HPI) 03/23/18 On evaluation today patient presents for initial evaluation our clinic concerning issues that she's been having with the bilateral lower extremities. She has been out of the hospital for about a week and was there for about four days due to fluid overload. She does have congestive heart failure, atrial fibrillation, diabetes mellitus type II, and a right first toe amputation. Currently she is having bilateral lower extremity weeping and swelling. During her time in the hospital that she took her off of her diuretic medications according to what her son is telling me today. With that being said her primary care provider, Dr. Sabra Heck, has placed her back on some of these medications according to what her son is telling me. She has undergone previous testing including hemoglobin A1c of 7.6 on 01/11/18. She's also had arterial studies which show that her right  ABI was 0.89 and the left ABI of 0.94 this obviously is good as well. Currently the biggest concern with both the patient and her son are issues with overall general fluid overload and the fact that just wrapping her legs or managing the legs as we would do here in the clinic will not treat the overall fluid overload issue which I completely agree with. With that being said I think that we definitely  need to try to get these areas closed as quickly as possible in order to prevent any chance for infection/cellulitis. Wound History Patient reportedly has not tested positive for osteomyelitis. Patient reportedly has had testing performed to evaluate circulation in the legs. Patient History Information obtained from Patient. Allergies carbamazepine (Reaction: unknown), Cymbalta (Severity: Moderate, Reaction: confusion), duloxetine (Severity: Moderate, Reaction: confusion), Lyrica (Severity: Moderate, Reaction: confusion), temazepam (Severity: Moderate, Reaction: hallucinations) Family History Hypertension - Child,Mother, Stroke - Father, No family history of Cancer, Diabetes, Kidney Disease, Lung Disease, Seizures, Thyroid Problems, Tuberculosis. Social History Former smoker - 30 years ago, Marital Status - Widowed, Alcohol Use - Never, Drug Use - No History, Caffeine Use - Rarely. Medical History Eyes Patient has history of Cataracts Denies history of Glaucoma, Optic Neuritis Ear/Nose/Mouth/Throat Denies history of Chronic sinus problems/congestion, Middle ear problems Hematologic/Lymphatic Patient has history of Anemia Razzano, Aranda P. ([id number]) Denies history of Hemophilia, Human Immunodeficiency Virus, Lymphedema, Sickle Cell Disease Respiratory Denies history of Aspiration, Asthma, Chronic Obstructive Pulmonary Disease (COPD), Pneumothorax, Sleep Apnea, Tuberculosis Cardiovascular Patient has history of Arrhythmia - A. fib, Congestive Heart Failure, Hypertension Denies history  of Angina, Hypotension, Myocardial Infarction, Peripheral Arterial Disease, Peripheral Venous Disease, Phlebitis, Vasculitis Gastrointestinal Denies history of Cirrhosis , Colitis, Crohn s, Hepatitis A, Hepatitis B, Hepatitis C Endocrine Patient has history of Type II Diabetes - oral medication Genitourinary Denies history of End Stage Renal Disease Immunological Denies history of Lupus Erythematosus, Raynaud s, Scleroderma Integumentary (Skin) Denies history of History of Burn, History of pressure wounds Musculoskeletal Patient has history of Rheumatoid Arthritis, Osteoarthritis Denies history of Gout, Osteomyelitis Neurologic Patient has history of Neuropathy Denies history of Dementia, Quadriplegia, Paraplegia, Seizure Disorder Oncologic Denies history of Received Chemotherapy, Received Radiation Psychiatric Patient has history of Anorexia/bulimia, Confinement Anxiety Patient is treated with Oral Agents. Blood sugar is not tested. Hospitalization/Surgery History - 03/10/2018, [place] [facility], Fluid retention. Review of Systems (ROS) Constitutional Symptoms (General Health) Denies complaints or symptoms of Fatigue, Fever, Chills, Marked Weight Change. Eyes Complains or has symptoms of Vision Changes - Bllind in right eye. Denies complaints or symptoms of Dry Eyes, Glasses / Contacts. Ear/Nose/Mouth/Throat Complains or has symptoms of Sinusitis. Denies complaints or symptoms of Difficult clearing ears. Hematologic/Lymphatic Denies complaints or symptoms of Bleeding / Clotting Disorders, Human Immunodeficiency Virus. Respiratory Denies complaints or symptoms of Chronic or frequent coughs, Shortness of Breath. Cardiovascular Complains or has symptoms of LE edema. Denies complaints or symptoms of Chest pain. Gastrointestinal Denies complaints or symptoms of Frequent diarrhea, Nausea, Vomiting. Endocrine Complains or has symptoms of Thyroid disease. Denies complaints or  symptoms of Hepatitis, Polydypsia (Excessive Thirst). Genitourinary Complains or has symptoms of Incontinence/dribbling. Denies complaints or symptoms of Kidney failure/ Dialysis. Immunological Denies complaints or symptoms of Hives, Itching. Integumentary (Skin) Schwegel, Shaquira P. ([id number]) Complains or has symptoms of Wounds, Swelling. Denies complaints or symptoms of Bleeding or bruising tendency, Breakdown. Musculoskeletal Complains or has symptoms of Muscle Weakness. Denies complaints or symptoms of Muscle Pain. Neurologic Complains or has symptoms of Numbness/parasthesias. Denies complaints or symptoms of Focal/Weakness. Oncologic The patient has no complaints or symptoms. Psychiatric Complains or has symptoms of Anxiety, Claustrophobia. Objective Constitutional sitting or standing blood pressure is within target range for patient.. pulse regular and within target range for patient.Marland Kitchen respirations regular, non-labored and within target range for patient.Marland Kitchen temperature within target range for patient.. Well- nourished and well-hydrated in no acute distress. Vitals Time Taken: 10:52 AM, Height: 65 in, Source: Stated, Weight: 155 lbs, Source: Stated,  BMI: 25.8, Temperature: 97.5  F, Pulse: 63 bpm, Respiratory Rate: 16 breaths/min, Blood Pressure: 98/61 mmHg. Eyes conjunctiva clear no eyelid edema noted. pupils equal round and reactive to light and accommodation. Ears, Nose, Mouth, and Throat no gross abnormality of ear auricles or external auditory canals. normal hearing noted during conversation. mucus membranes moist. Respiratory normal breathing without difficulty. clear to auscultation bilaterally. Cardiovascular regular rate and rhythm with normal S1, S2. no clubbing, cyanosis, significant edema, Gastrointestinal (GI) soft, non-tender, non-distended, +BS. no ventral hernia noted. Musculoskeletal Patient unable to walk without assistance. no significant deformity or  arthritic changes, no loss or range of motion, no clubbing. Psychiatric this patient is able to make decisions and demonstrates good insight into disease process. Alert and Oriented x 3. pleasant and cooperative. General Notes: Patient's wound bed currently shows evidence breakdown and weeping of the bilateral lower extremities at multiple locations. There is no evidence or need for sharp debridement at this point. With that being said I do believe that the patient would benefit from compression therapy for lower extremities based on what I'm seeing at this time. Stroh, Leathia P. ([id number]) Integumentary (Hair, Skin) Wound #1 status is Open. Original cause of wound was Gradually Appeared. The wound is located on the Left,Dorsal Foot. The wound measures 8cm length x 4.5cm width x 0.1cm depth; 28.274cm^2 area and 2.827cm^3 volume. There is Fat Layer (Subcutaneous Tissue) Exposed exposed. There is no tunneling or undermining noted. There is a large amount of serous drainage noted. The wound margin is indistinct and nonvisible. There is no granulation within the wound bed. There is a large (67-100%) amount of necrotic tissue within the wound bed including Adherent Slough. The periwound skin appearance exhibited: Induration, Maceration, Rubor. The periwound skin appearance did not exhibit: Callus, Crepitus, Excoriation, Rash, Scarring, Dry/Scaly, Atrophie Blanche, Cyanosis, Ecchymosis, Hemosiderin Staining, Mottled, Pallor, Erythema. Periwound temperature was noted as No Abnormality. The periwound has tenderness on palpation. Wound #2 status is Open. Original cause of wound was Gradually Appeared. The wound is located on the Left,Dorsal Toe Second. The wound measures 0.7cm length x 0.4cm width x 0.1cm depth; 0.22cm^2 area and 0.022cm^3 volume. There is no tunneling or undermining noted. There is a none present amount of drainage noted. The wound margin is flat and intact. There is no granulation  within the wound bed. There is a large (67-100%) amount of necrotic tissue within the wound bed including Eschar. The periwound skin appearance exhibited: Rubor. The periwound skin appearance did not exhibit: Callus, Crepitus, Excoriation, Induration, Rash, Scarring, Dry/Scaly, Maceration, Atrophie Blanche, Cyanosis, Ecchymosis, Hemosiderin Staining, Mottled, Pallor, Erythema. Periwound temperature was noted as No Abnormality. The periwound has tenderness on palpation. Wound #3 status is Open. Original cause of wound was Gradually Appeared. The wound is located on the Left,Anterior Lower Leg. The wound measures 15cm length x 9cm width x 0.1cm depth; 106.029cm^2 area and 10.603cm^3 volume. There is Fat Layer (Subcutaneous Tissue) Exposed exposed. There is no tunneling or undermining noted. There is a large amount of serous drainage noted. The wound margin is indistinct and nonvisible. There is large (67-100%) granulation within the wound bed. There is a small (1-33%) amount of necrotic tissue within the wound bed including Adherent Slough. The periwound skin appearance exhibited: Excoriation, Maceration, Rubor. The periwound skin appearance did not exhibit: Callus, Crepitus, Induration, Rash, Scarring, Dry/Scaly, Atrophie Blanche, Cyanosis, Ecchymosis, Hemosiderin Staining, Mottled, Pallor, Erythema. Wound #4 status is Open. Original cause of wound was Gradually Appeared. The wound is located on the  Right,Anterior Lower Leg. The wound measures 8.5cm length x 6cm width x 0.1cm depth; 40.055cm^2 area and 4.006cm^3 volume. The wound is limited to skin breakdown. There is no tunneling or undermining noted. There is a large amount of serous drainage noted. The wound margin is indistinct and nonvisible. There is no granulation within the wound bed. There is no necrotic tissue within the wound bed. The periwound skin appearance exhibited: Maceration, Rubor. The periwound skin appearance did not exhibit:  Callus, Crepitus, Excoriation, Induration, Rash, Scarring, Dry/Scaly, Atrophie Blanche, Cyanosis, Ecchymosis, Hemosiderin Staining, Mottled, Pallor, Erythema. Periwound temperature was noted as No Abnormality. The periwound has tenderness on palpation. Wound #5 status is Open. Original cause of wound was Gradually Appeared. The wound is located on the Right,Lateral Lower Leg. The wound measures 10cm length x 5cm width x 0.2cm depth; 39.27cm^2 area and 7.854cm^3 volume. There is no tunneling or undermining noted. There is a large amount of serous drainage noted. The wound margin is indistinct and nonvisible. There is no granulation within the wound bed. There is a small (1-33%) amount of necrotic tissue within the wound bed including Adherent Slough. The periwound skin appearance exhibited: Maceration, Rubor. The periwound skin appearance did not exhibit: Callus, Crepitus, Excoriation, Induration, Rash, Scarring, Dry/Scaly, Atrophie Blanche, Cyanosis, Ecchymosis, Hemosiderin Staining, Mottled, Pallor, Erythema. Periwound temperature was noted as No Abnormality. The periwound has tenderness on palpation. Wound #6 status is Open. Original cause of wound was Gradually Appeared. The wound is located on the Left Calcaneus. The wound measures 1.1cm length x 1.2cm width x 0.2cm depth; 1.037cm^2 area and 0.207cm^3 volume. There is Fat Layer (Subcutaneous Tissue) Exposed exposed. There is no tunneling noted, however, there is undermining starting at 11:00 and ending at 12:00 with a maximum distance of 1.6cm. There is a large amount of serous drainage noted. The wound margin is flat and intact. There is small (1-33%) pink granulation within the wound bed. There is a large (67-100%) amount of necrotic tissue within the wound bed including Adherent Slough. The periwound skin appearance exhibited: Maceration. The periwound skin appearance did not exhibit: Callus, Crepitus, Excoriation, Induration, Rash, Scarring,  Dry/Scaly, Atrophie Blanche, Cyanosis, Ecchymosis, Hemosiderin Staining, Mottled, Pallor, Rubor, Erythema. Wound #7 status is Open. Original cause of wound was Gradually Appeared. The wound is located on the Right Calcaneus. The wound measures 3.5cm length x 6cm width x 0.1cm depth; 16.493cm^2 area and 1.649cm^3 volume. There is no tunneling or undermining noted. There is a none present amount of drainage noted. The wound margin is distinct with the outline attached to the wound base. There is no granulation within the wound bed. There is no necrotic tissue within the Woolum, Mikaia P. ([id number]) wound bed. The periwound skin appearance did not exhibit: Callus, Crepitus, Excoriation, Induration, Rash, Scarring, Dry/Scaly, Maceration, Atrophie Blanche, Cyanosis, Ecchymosis, Hemosiderin Staining, Mottled, Pallor, Rubor, Erythema. Assessment Active Problems ICD-10 Type 2 diabetes mellitus with foot ulcer Lymphedema, not elsewhere classified Non-pressure chronic ulcer of other part of left lower leg with fat layer exposed Non-pressure chronic ulcer of other part of right lower leg with fat layer exposed Non-pressure chronic ulcer of other part of right foot with fat layer exposed Non-pressure chronic ulcer of other part of left foot with fat layer exposed Other specified peripheral vascular diseases Plan Wound Cleansing: Wound #1 Left,Dorsal Foot: Cleanse wound with mild soap and water Wound #2 Left,Dorsal Toe Second: Cleanse wound with mild soap and water Wound #3 Left,Anterior Lower Leg: Cleanse wound with mild soap and water Wound #  4 Right,Anterior Lower Leg: Cleanse wound with mild soap and water Wound #5 Right,Lateral Lower Leg: Cleanse wound with mild soap and water Wound #6 Left Calcaneus: Cleanse wound with mild soap and water Wound #7 Right Calcaneus: Cleanse wound with mild soap and water Primary Wound Dressing: Wound #1 Left,Dorsal Foot: Silver Alginate Wound #2  Left,Dorsal Toe Second: Silver Alginate Wound #3 Left,Anterior Lower Leg: Silver Alginate Wound #4 Right,Anterior Lower Leg: Silver Alginate Wound #5 Right,Lateral Lower Leg: Silver Alginate Wound #6 Left Calcaneus: Silver Alginate Wound #7 Right Calcaneus: Silver Alginate Secondary Dressing: Wound #1 Left,Dorsal Foot: Stadler, Roderica P. ([id number]) ABD pad Wound #2 Left,Dorsal Toe Second: ABD pad Wound #3 Left,Anterior Lower Leg: ABD pad Wound #4 Right,Anterior Lower Leg: ABD pad Wound #5 Right,Lateral Lower Leg: ABD pad Wound #6 Left Calcaneus: ABD pad Wound #7 Right Calcaneus: ABD pad Dressing Change Frequency: Wound #1 Left,Dorsal Foot: Three times weekly Wound #2 Left,Dorsal Toe Second: Three times weekly Wound #3 Left,Anterior Lower Leg: Three times weekly Wound #4 Right,Anterior Lower Leg: Three times weekly Wound #5 Right,Lateral Lower Leg: Three times weekly Wound #6 Left Calcaneus: Three times weekly Wound #7 Right Calcaneus: Three times weekly Follow-up Appointments: Wound #1 Left,Dorsal Foot: Return Appointment in 1 week. Wound #2 Left,Dorsal Toe Second: Return Appointment in 1 week. Wound #3 Left,Anterior Lower Leg: Return Appointment in 1 week. Wound #4 Right,Anterior Lower Leg: Return Appointment in 1 week. Wound #5 Right,Lateral Lower Leg: Return Appointment in 1 week. Wound #6 Left Calcaneus: Return Appointment in 1 week. Wound #7 Right Calcaneus: Return Appointment in 1 week. Edema Control: Wound #1 Left,Dorsal Foot: 3 Layer Compression System - Bilateral - Please wrap from toes to 3cm below the knee Wound #2 Left,Dorsal Toe Second: 3 Layer Compression System - Bilateral - Please wrap from toes to 3cm below the knee Wound #3 Left,Anterior Lower Leg: 3 Layer Compression System - Bilateral - Please wrap from toes to 3cm below the knee Wound #4 Right,Anterior Lower Leg: 3 Layer Compression System - Bilateral - Please wrap from toes to  3cm below the knee Wound #5 Right,Lateral Lower Leg: 3 Layer Compression System - Bilateral - Please wrap from toes to 3cm below the knee Wound #6 Left Calcaneus: 3 Layer Compression System - Bilateral - Please wrap from toes to 3cm below the knee Wound #7 Right Calcaneus: 3 Layer Compression System - Bilateral - Please wrap from toes to 3cm below the knee Home Health: Wound #1 Left,Dorsal Foot: ASTRAEA, GAUGHRAN ([id number]) [place] Visits - [facility] Nurse may visit PRN to address patient s wound care needs. FACE TO FACE ENCOUNTER: MEDICARE and MEDICAID PATIENTS: I certify that this patient is under my care and that I had a face-to-face encounter that meets the physician face-to-face encounter requirements with this patient on this date. The encounter with the patient was in whole or in part for the following MEDICAL CONDITION: (primary reason for [place]) MEDICAL NECESSITY: I certify, that based on my findings, NURSING services are a medically necessary home health service. HOME BOUND STATUS: I certify that my clinical findings support that this patient is homebound (i.e., Due to illness or injury, pt requires aid of supportive devices such as crutches, cane, wheelchairs, walkers, the use of special transportation or the assistance of another person to leave their place of residence. There is a normal inability to leave the home and doing so requires considerable and taxing effort. Other absences are for medical reasons / religious services  and are infrequent or of short duration when for other reasons). If current dressing causes regression in wound condition, may D/C ordered dressing product/s and apply Normal Saline Moist Dressing daily until next [place] / Other MD appointment. [place] of regression in wound condition at [phone]. Please direct any NON-WOUND related issues/requests for orders to patient's Primary  Care Physician Wound #2 Left,Dorsal Toe Second: [place] Visits - [facility] Nurse may visit PRN to address patient s wound care needs. FACE TO FACE ENCOUNTER: MEDICARE and MEDICAID PATIENTS: I certify that this patient is under my care and that I had a face-to-face encounter that meets the physician face-to-face encounter requirements with this patient on this date. The encounter with the patient was in whole or in part for the following MEDICAL CONDITION: (primary reason for [place]) MEDICAL NECESSITY: I certify, that based on my findings, NURSING services are a medically necessary home health service. HOME BOUND STATUS: I certify that my clinical findings support that this patient is homebound (i.e., Due to illness or injury, pt requires aid of supportive devices such as crutches, cane, wheelchairs, walkers, the use of special transportation or the assistance of another person to leave their place of residence. There is a normal inability to leave the home and doing so requires considerable and taxing effort. Other absences are for medical reasons / religious services and are infrequent or of short duration when for other reasons). If current dressing causes regression in wound condition, may D/C ordered dressing product/s and apply Normal Saline Moist Dressing daily until next [place] / Other MD appointment. [place] of regression in wound condition at [phone]. Please direct any NON-WOUND related issues/requests for orders to patient's Primary Care Physician Wound #3 Left,Anterior Lower Leg: [place] Visits - [facility] Nurse may visit PRN to address patient s wound care needs. FACE TO FACE ENCOUNTER: MEDICARE and MEDICAID PATIENTS: I certify that this patient is under my care and that I had a face-to-face encounter that meets the physician face-to-face encounter requirements with this patient on this  date. The encounter with the patient was in whole or in part for the following MEDICAL CONDITION: (primary reason for [place]) MEDICAL NECESSITY: I certify, that based on my findings, NURSING services are a medically necessary home health service. HOME BOUND STATUS: I certify that my clinical findings support that this patient is homebound (i.e., Due to illness or injury, pt requires aid of supportive devices such as crutches, cane, wheelchairs, walkers, the use of special transportation or the assistance of another person to leave their place of residence. There is a normal inability to leave the home and doing so requires considerable and taxing effort. Other absences are for medical reasons / religious services and are infrequent or of short duration when for other reasons). If current dressing causes regression in wound condition, may D/C ordered dressing product/s and apply Normal Saline Moist Dressing daily until next [place] / Other MD appointment. [place] of regression in wound condition at [phone]. Please direct any NON-WOUND related issues/requests for orders to patient's Primary Care Physician Wound #4 Right,Anterior Lower Leg: [place] Visits - [facility] Nurse may visit PRN to address patient s wound care needs. FACE TO FACE ENCOUNTER: MEDICARE and MEDICAID PATIENTS: I certify that this patient is under my care and that I had a face-to-face encounter that meets the physician face-to-face encounter  requirements with this patient on this date. The encounter with the patient was in whole or in part for the following MEDICAL CONDITION: (primary reason for [place]) MEDICAL NECESSITY: I certify, that based on my findings, NURSING services are a medically necessary home health service. HOME BOUND STATUS: I certify that my clinical findings support that this patient is homebound (i.e., Due to illness or injury, pt  requires aid of supportive devices such as crutches, cane, wheelchairs, walkers, the use of special transportation or the assistance of another person to leave their place of residence. There is a normal inability to leave the home and doing so requires considerable and taxing effort. Other absences are for medical reasons / religious services and are infrequent or of short duration when for other reasons). If current dressing causes regression in wound condition, may D/C ordered dressing product/s and apply Normal Saline Hintz, Kielee P. ([id number]) Moist Dressing daily until next [place] / Other MD appointment. [place] of regression in wound condition at [phone]. Please direct any NON-WOUND related issues/requests for orders to patient's Primary Care Physician Wound #5 Right,Lateral Lower Leg: [place] Visits - [facility] Nurse may visit PRN to address patient s wound care needs. FACE TO FACE ENCOUNTER: MEDICARE and MEDICAID PATIENTS: I certify that this patient is under my care and that I had a face-to-face encounter that meets the physician face-to-face encounter requirements with this patient on this date. The encounter with the patient was in whole or in part for the following MEDICAL CONDITION: (primary reason for [place]) MEDICAL NECESSITY: I certify, that based on my findings, NURSING services are a medically necessary home health service. HOME BOUND STATUS: I certify that my clinical findings support that this patient is homebound (i.e., Due to illness or injury, pt requires aid of supportive devices such as crutches, cane, wheelchairs, walkers, the use of special transportation or the assistance of another person to leave their place of residence. There is a normal inability to leave the home and doing so requires considerable and taxing effort. Other absences are for medical reasons / religious services and are  infrequent or of short duration when for other reasons). If current dressing causes regression in wound condition, may D/C ordered dressing product/s and apply Normal Saline Moist Dressing daily until next [place] / Other MD appointment. [place] of regression in wound condition at [phone]. Please direct any NON-WOUND related issues/requests for orders to patient's Primary Care Physician Wound #6 Left Calcaneus: [place] Visits - [facility] Nurse may visit PRN to address patient s wound care needs. FACE TO FACE ENCOUNTER: MEDICARE and MEDICAID PATIENTS: I certify that this patient is under my care and that I had a face-to-face encounter that meets the physician face-to-face encounter requirements with this patient on this date. The encounter with the patient was in whole or in part for the following MEDICAL CONDITION: (primary reason for [place]) MEDICAL NECESSITY: I certify, that based on my findings, NURSING services are a medically necessary home health service. HOME BOUND STATUS: I certify that my clinical findings support that this patient is homebound (i.e., Due to illness or injury, pt requires aid of supportive devices such as crutches, cane, wheelchairs, walkers, the use of special transportation or the assistance of another person to leave their place of residence. There is a normal inability to leave the home and doing so requires considerable and taxing effort. Other absences  are for medical reasons / religious services and are infrequent or of short duration when for other reasons). If current dressing causes regression in wound condition, may D/C ordered dressing product/s and apply Normal Saline Moist Dressing daily until next [place] / Other MD appointment. [place] of regression in wound condition at [phone]. Please direct any NON-WOUND related issues/requests for orders to  patient's Primary Care Physician Wound #7 Right Calcaneus: [place] Visits - [facility] Nurse may visit PRN to address patient s wound care needs. FACE TO FACE ENCOUNTER: MEDICARE and MEDICAID PATIENTS: I certify that this patient is under my care and that I had a face-to-face encounter that meets the physician face-to-face encounter requirements with this patient on this date. The encounter with the patient was in whole or in part for the following MEDICAL CONDITION: (primary reason for [place]) MEDICAL NECESSITY: I certify, that based on my findings, NURSING services are a medically necessary home health service. HOME BOUND STATUS: I certify that my clinical findings support that this patient is homebound (i.e., Due to illness or injury, pt requires aid of supportive devices such as crutches, cane, wheelchairs, walkers, the use of special transportation or the assistance of another person to leave their place of residence. There is a normal inability to leave the home and doing so requires considerable and taxing effort. Other absences are for medical reasons / religious services and are infrequent or of short duration when for other reasons). If current dressing causes regression in wound condition, may D/C ordered dressing product/s and apply Normal Saline Moist Dressing daily until next [place] / Other MD appointment. [place] of regression in wound condition at [phone]. Please direct any NON-WOUND related issues/requests for orders to patient's Primary Care Physician I had a long discussion with the patient and her son today concerning the fact that she has an appointment here with [place] as well as an appointment with vascular this afternoon. Again I think the vascular can still be helpful especially in consideration of whether or not anything from a venous standpoint should be tested and further evaluated or not. Obviously if  there's anything they can do to help control the venous stasis that will help with in general preventing further breakdown of her lower extremities in the future. In the meantime I am gonna also see about having my staff for my note to Dr. Ammie Ferrier as well so that Stohr, Khiara P. ([id number]) he is in the loop of what's going on. The patient son was definitely appreciative of this. Lastly we're gonna go ahead and initiate one care orders for the patient and send these to home health as well initiate and meet. We are not gonna wrap the patient today simply due to the fact that going to vascular I'm sure there gonna unwrap the legs to have a look. Nonetheless when home health comes out to see her next they can reapply the compression wraps and dressings as we recommended today. A copy of these orders. The patient to vascular as well. Otherwise will see were things stand at follow-up. Please see above for specific wound care orders. We will see patient for re-evaluation in 1 week(s) here in the clinic. If anything worsens or changes patient will contact our office for additional recommendations. Electronic Signature(s) Signed: 03/24/2018 1:14:32 PM By: Worthy Keeler PA-C Entered By: Worthy Keeler on 03/24/2018 13:12:32 Grotz, [place] ([id number]) -------------------------------------------------------------------------------- ROS/PFSH Details Patient Name:  Sharon [name], [name] Date of Service: 03/23/2018 10:15 AM Medical Record Number: [id number] Patient Account Number: [id number] Date of Birth/Sex: 09/16/1933 (85 y.o. F) Treating RN: Army Melia Primary Care Provider: Emily Filbert Other Clinician: Referring Provider: Jannifer Franklin, DAVID Treating Provider/Extender: Melburn Hake, Dossie Swor Weeks in Treatment: 0 Information Obtained From Patient Wound History Do you currently have one or more open woundso Yes Approximately how long have you had your woundso 3 weeks Has your wound(s) ever healed and  then re-openedo No Have you had any lab work done in the past montho Yes Who ordered the lab work [facility] Have you tested positive for an antibiotic resistant organism (MRSA, VRE)o Yes Date: 03/10/2018 Have you tested positive for osteomyelitis (bone infection)o No Have you had any tests for circulation on your legso Yes Who ordered the testo [facility] Where was the test doneo 01/13/2017 Constitutional Symptoms (General Health) Complaints and Symptoms: Negative for: Fatigue; Fever; Chills; Marked Weight Change Eyes Complaints and Symptoms: Positive for: Vision Changes - Bllind in right eye Negative for: Dry Eyes; Glasses / Contacts Medical History: Positive for: Cataracts Negative for: Glaucoma; Optic Neuritis Ear/Nose/Mouth/Throat Complaints and Symptoms: Positive for: Sinusitis Negative for: Difficult clearing ears Medical History: Negative for: Chronic sinus problems/congestion; Middle ear problems Hematologic/Lymphatic Complaints and Symptoms: Negative for: Bleeding / Clotting Disorders; Human Immunodeficiency Virus Medical History: Positive for: Anemia Negative for: Hemophilia; Human Immunodeficiency Virus; Lymphedema; Sickle Cell Disease Erbe, Kiosha P. ([id number]) Respiratory Complaints and Symptoms: Negative for: Chronic or frequent coughs; Shortness of Breath Medical History: Negative for: Aspiration; Asthma; Chronic Obstructive Pulmonary Disease (COPD); Pneumothorax; Sleep Apnea; Tuberculosis Cardiovascular Complaints and Symptoms: Positive for: LE edema Negative for: Chest pain Medical History: Positive for: Arrhythmia - A. fib; Congestive Heart Failure; Hypertension Negative for: Angina; Hypotension; Myocardial Infarction; Peripheral Arterial Disease; Peripheral Venous Disease; Phlebitis; Vasculitis Gastrointestinal Complaints and Symptoms: Negative for: Frequent diarrhea; Nausea; Vomiting Medical History: Negative for: Cirrhosis  ; Colitis; Crohnos; Hepatitis A; Hepatitis B; Hepatitis C Endocrine Complaints and Symptoms: Positive for: Thyroid disease Negative for: Hepatitis; Polydypsia (Excessive Thirst) Medical History: Positive for: Type II Diabetes - oral medication Time with diabetes: 15 years Treated with: Oral agents Blood sugar tested every day: No Genitourinary Complaints and Symptoms: Positive for: Incontinence/dribbling Negative for: Kidney failure/ Dialysis Medical History: Negative for: End Stage Renal Disease Immunological Complaints and Symptoms: Negative for: Hives; Itching Medical History: Negative for: Lupus Erythematosus; Raynaudos; Scleroderma Integumentary (Skin) Sharon [name], [name] ([id number]) Complaints and Symptoms: Positive for: Wounds; Swelling Negative for: Bleeding or bruising tendency; Breakdown Medical History: Negative for: History of Burn; History of pressure wounds Musculoskeletal Complaints and Symptoms: Positive for: Muscle Weakness Negative for: Muscle Pain Medical History: Positive for: Rheumatoid Arthritis; Osteoarthritis Negative for: Gout; Osteomyelitis Neurologic Complaints and Symptoms: Positive for: Numbness/parasthesias Negative for: Focal/Weakness Medical History: Positive for: Neuropathy Negative for: Dementia; Quadriplegia; Paraplegia; Seizure Disorder Psychiatric Complaints and Symptoms: Positive for: Anxiety; Claustrophobia Medical History: Positive for: Anorexia/bulimia; Confinement Anxiety Oncologic Complaints and Symptoms: No Complaints or Symptoms Medical History: Negative for: Received Chemotherapy; Received Radiation HBO Extended History Items Eyes: Cataracts Immunizations Pneumococcal Vaccine: Received Pneumococcal Vaccination: Yes Implantable Devices Yes Hospitalization / Surgery History Name of Hospital Purpose of Hospitalization/Surgery Date [facility] Fluid retention 03/10/2018 DEBBI, STRANDBERG. ([id number]) Family  and Social History Cancer: No; Diabetes: No; Hypertension: Yes - Child,Mother; Kidney Disease: No; Lung Disease: No; Seizures: No; Stroke: Yes - Father; Thyroid Problems: No; Tuberculosis: No; Former smoker - 30 years ago; Marital Status - Widowed; Alcohol Use:  Never; Drug Use: No History; Caffeine Use: Rarely; Financial Concerns: No; Food, Clothing or Shelter Needs: No; Support System Lacking: No; Transportation Concerns: No; Advanced Directives: No; Living Will: Yes (Not Provided); Medical Power of Attorney: Yes Pharmacist, hospital (Son) (Not Provided) Electronic Signature(s) Signed: 03/23/2018 4:39:27 PM By: Army Melia Signed: 03/24/2018 1:14:32 PM By: Worthy Keeler PA-C Entered By: Army Melia on 03/23/2018 11:24:45 Mcguirt, Eudora P. ([id number]) -------------------------------------------------------------------------------- SuperBill Details Patient Name: Sharon [name], [name] Date of Service: 03/23/2018 Medical Record Number: [id number] Patient Account Number: [id number] Date of Birth/Sex: January 17, 1933 (85 y.o. F) Treating RN: Montey Hora Primary Care Provider: Emily Filbert Other Clinician: Referring Provider: Jannifer Franklin, DAVID Treating Provider/Extender: Melburn Hake, Jarita Raval Weeks in Treatment: 0 Diagnosis Coding ICD-10 Codes Code Description E11.621 Type 2 diabetes mellitus with foot ulcer I89.0 Lymphedema, not elsewhere classified L97.822 Non-pressure chronic ulcer of other part of left lower leg with fat layer exposed L97.812 Non-pressure chronic ulcer of other part of right lower leg with fat layer exposed L97.512 Non-pressure chronic ulcer of other part of right foot with fat layer exposed L97.522 Non-pressure chronic ulcer of other part of left foot with fat layer exposed I73.89 Other specified peripheral vascular diseases Physician Procedures CPT4 Code Description: [id number] WC PHYS LEVEL 3 o NEW PT ICD-10 Diagnosis Description E11.621 Type 2 diabetes mellitus with foot ulcer I89.0  Lymphedema, not elsewhere classified L97.822 Non-pressure chronic ulcer of other part of left lower leg with  L97.812 Non-pressure chronic ulcer of other part of right lower leg with Modifier: fat layer expos fat layer expo Quantity: 1 ed sed Electronic Signature(s) Signed: 03/24/2018 1:14:32 PM By: Worthy Keeler PA-C Entered By: Worthy Keeler on 03/24/2018 13:12:44

## 2018-03-25 NOTE — Progress Notes (Signed)
Sharon [name] ([id number]) Visit Report for 03/23/2018 Allergy List Details Patient Name: Sharon [name], [name] Date of Service: 03/23/2018 10:15 AM Medical Record Number: [id number] Patient Account Number: [id number] Date of Birth/Sex: Aug 29, 1933 (85 y.o. F) Treating RN: Army Melia Primary Care Alline Pio: Emily Filbert Other Clinician: Referring Kyzer Blowe: Jannifer Franklin, DAVID Treating Martell Mcfadyen/Extender: Melburn Hake, HOYT Weeks in Treatment: 0 Allergies Active Allergies carbamazepine Reaction: unknown Cymbalta Reaction: confusion Severity: Moderate duloxetine Reaction: confusion Severity: Moderate Lyrica Reaction: confusion Severity: Moderate temazepam Reaction: hallucinations Severity: Moderate Allergy Notes Electronic Signature(s) Signed: 03/23/2018 4:39:27 PM By: Army Melia Entered By: Army Melia on 03/23/2018 11:02:30 Austell, Sharon [name] ([id number]) -------------------------------------------------------------------------------- Arrival Information Details Patient Name: Sharon [name], [name] Date of Service: 03/23/2018 10:15 AM Medical Record Number: [id number] Patient Account Number: [id number] Date of Birth/Sex: [birth year] (85 y.o. F) Treating RN: Army Melia Primary Care Garion Wempe: Emily Filbert Other Clinician: Referring Sharada Albornoz: Jannifer Franklin, DAVID Treating Montrail Mehrer/Extender: Melburn Hake, HOYT Weeks in Treatment: 0 Visit Information Patient Arrived: Wheel Chair Arrival Time: 10:50 Accompanied By: son Transfer Assistance: None Patient Identification Verified: Yes Electronic Signature(s) Signed: 03/23/2018 4:39:27 PM By: Army Melia Entered By: Army Melia on 03/23/2018 10:51:28 Sharon [name] ([id number]) -------------------------------------------------------------------------------- Lower Extremity Assessment Details Patient Name: Sharon [name], [name] Date of Service: 03/23/2018 10:15 AM Medical Record Number: [id number] Patient Account Number: [id number] Date of Birth/Sex:  09-02-1933 (85 y.o. F) Treating RN: Army Melia Primary Care Kalijah Zeiss: Emily Filbert Other Clinician: Referring Treshawn Allen: Jannifer Franklin, DAVID Treating Arlen Legendre/Extender: Melburn Hake, HOYT Weeks in Treatment: 0 Edema Assessment Assessed: [Left: No] [Right: No] Edema: [Left: Yes] [Right: Yes] Calf Left: Right: Point of Measurement: 31 cm From Medial Instep 32 cm 36 cm Ankle Left: Right: Point of Measurement: 10 cm From Medial Instep 20 cm 21 cm Vascular Assessment Pulses: Dorsalis Pedis Palpable: [Left:Yes] [Right:Yes] Posterior Tibial Extremity colors, hair growth, and conditions: Extremity Color: [Left:Red] [Right:Red] Hair Growth on Extremity: [Left:No] [Right:No] Temperature of Extremity: [Left:Warm] [Right:Warm] Capillary Refill: [Left:< 3 seconds] [Right:< 3 seconds] Toe Nail Assessment Left: Right: Thick: Yes Yes Discolored: Yes Yes Deformed: No No Improper Length and Hygiene: No No Electronic Signature(s) Signed: 03/23/2018 12:00:49 PM By: Army Melia Entered By: Army Melia on 03/23/2018 12:00:48 Sharon [name] P. ([id number]) -------------------------------------------------------------------------------- Multi Wound Chart Details Patient Name: [name], [name] Date of Service: 03/23/2018 10:15 AM Medical Record Number: [id number] Patient Account Number: [id number] Date of Birth/Sex: 04/03/1933 (85 y.o. 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 Secondary Etiology: N/A N/A N/A Comorbid History: Cataracts, Anemia, Cataracts, Anemia, Cataracts, Anemia, Arrhythmia, Congestive Heart Arrhythmia, Congestive Heart Arrhythmia, Congestive Heart Failure, Hypertension, Type II Failure, Hypertension, Type II Failure, Hypertension, Type II Diabetes, Rheumatoid Diabetes, Rheumatoid Diabetes, Rheumatoid Arthritis, Osteoarthritis, Arthritis, Osteoarthritis, Arthritis, Osteoarthritis, Neuropathy, Anorexia/bulimia, Neuropathy, Anorexia/bulimia, Neuropathy, Anorexia/bulimia, Confinement Anxiety Confinement Anxiety Confinement Anxiety Date Acquired: 03/09/2018 03/09/2018 03/09/2018 Weeks of Treatment: 0 0 0 Wound Status: Open Open Open Clustered Wound: Yes No Yes Clustered Quantity: N/A N/A 2 Measurements L x W x D 8x4.5x0.1 0.7x0.4x0.1 15x9x0.1 (cm) Area (cm) : 28.274 0.22 106.029 Volume (cm) : 2.827  0.022 10.603 % Reduction in Area: 0.00% N/A 0.00% % Reduction in Volume: 0.00% N/A 0.00% Undermining: No No No Classification: Grade 2 Unable to visualize wound bed Full Thickness Without Exposed Support Structures Exudate Amount: Large None Present Large Exudate Type: Serous N/A Serous Exudate Color: amber N/A amber Sharon [name], [name] ([id number]) Wound Margin: Indistinct, nonvisible Flat and Intact Indistinct, nonvisible Granulation Amount: None Present (0%) None Present (0%) Large (67-100%) Granulation Quality: N/A N/A N/A Necrotic Amount: Large (67-100%) Large (67-100%) Small (1-33%) Necrotic Tissue: Adherent Webb Exposed Structures: Fat Layer (Subcutaneous Fascia: No Fat Layer (Subcutaneous Tissue) Exposed: Yes Fat Layer (Subcutaneous Tissue) Exposed: Yes Fascia: No Tissue) Exposed: No Fascia: No Tendon: No Tendon: No Tendon: No Muscle: No Muscle: No Muscle: No Joint: No Joint: No Joint: No Bone: No Bone: No Bone:  No Epithelialization: Large (67-100%) None None Periwound Skin Texture: Induration: Yes Excoriation: No Excoriation: Yes Excoriation: No Induration: No Induration: No Callus: No Callus: No Callus: No Crepitus: No Crepitus: No Crepitus: No Rash: No Rash: No Rash: No Scarring: No Scarring: No Scarring: No Periwound Skin Moisture: Maceration: Yes Maceration: No Maceration: Yes Dry/Scaly: No Dry/Scaly: No Dry/Scaly: No Periwound Skin Color: Rubor: Yes Rubor: Yes Rubor: Yes Atrophie Blanche: No Atrophie Blanche: No Atrophie Blanche: No Cyanosis: No Cyanosis: No Cyanosis: No Ecchymosis: No Ecchymosis: No Ecchymosis: No Erythema: No Erythema: No Erythema: No Hemosiderin Staining: No Hemosiderin Staining: No Hemosiderin Staining: No Mottled: No Mottled: No Mottled: No Pallor: No Pallor: No Pallor: No Temperature: No Abnormality No Abnormality N/A Tenderness on Palpation: Yes Yes No Wound Preparation: Ulcer Cleansing: Ulcer Cleansing: Ulcer Cleansing: Rinsed/Irrigated with Saline Rinsed/Irrigated with Saline Rinsed/Irrigated with Saline Topical Anesthetic Applied: Topical Anesthetic Applied: None None Wound Number: [id number]  Photos: Wound Location: Right Lower Leg - Anterior Right Lower Leg - Lateral Left Calcaneus Wounding Event: Gradually Appeared Gradually Appeared Gradually Appeared Primary Etiology: Lymphedema Lymphedema Pressure Ulcer Secondary Etiology: N/A N/A N/A Comorbid History: Cataracts, Anemia, Cataracts, Anemia, Cataracts, Anemia, Arrhythmia, Congestive Heart Arrhythmia, Congestive Heart Arrhythmia, Congestive Heart Failure, Hypertension, Type II Failure, Hypertension, Type II Failure, Hypertension, Type II Diabetes, Rheumatoid Diabetes, Rheumatoid Diabetes, Rheumatoid Arthritis, Osteoarthritis, Arthritis, Osteoarthritis, Arthritis, Osteoarthritis, Neuropathy, Anorexia/bulimia, Neuropathy, Anorexia/bulimia, Neuropathy,  Anorexia/bulimia, Confinement Anxiety Confinement Anxiety Confinement Anxiety Date Acquired: 03/09/2018 03/09/2018 03/08/2018 Sharon [name], [name] ([id number]) Weeks of Treatment: 0 0 0 Wound Status: Open Open Open Clustered Wound: No Yes No Clustered Quantity: N/A N/A N/A Measurements L x W x D 8.5x6x0.1 10x5x0.2 1.1x1.2x0.2 (cm) Area (cm) : 40.055 39.27 1.037 Volume (cm) : 4.006 7.854 0.207 % Reduction in Area: 0.00% 0.00% 0.00% % Reduction in Volume: 0.00% 0.00% 0.00% Starting Position 1 11 (o'clock): Ending Position 1 12 (o'clock): Maximum Distance 1 (cm): 1.6 Undermining: No No Yes Classification: Partial Thickness Partial Thickness Category/Stage II Exudate Amount: Large Large Large Exudate Type: Serous Serous Serous Exudate Color: amber amber amber Wound Margin: Indistinct, nonvisible Indistinct, nonvisible Flat and Intact Granulation Amount: None Present (0%) None Present (0%) Small (1-33%) Granulation Quality: N/A N/A Pink Necrotic Amount: None Present (0%) Small (1-33%) Large (67-100%) Necrotic Tissue: N/A Adherent [place] Exposed Structures: Fascia: No Fascia: No Fat Layer (Subcutaneous Fat Layer (Subcutaneous Fat Layer (Subcutaneous Tissue) Exposed: Yes Tissue) Exposed: No Tissue) Exposed: No Fascia: No Tendon: No Tendon: No Tendon: No Muscle: No Muscle: No Muscle: No Joint: No Joint: No Joint: No Bone: No Bone: No Bone: No Limited to Skin Breakdown Epithelialization: Large (67-100%) Large (67-100%) None Periwound Skin Texture:  Excoriation: No Excoriation: No Excoriation: No Induration: No Induration: No Induration: No Callus: No Callus: No Callus: No Crepitus: No Crepitus: No Crepitus: No Rash: No Rash: No Rash: No Scarring: No Scarring: No Scarring: No Periwound Skin Moisture: Maceration: Yes Maceration: Yes Maceration: Yes Dry/Scaly: No Dry/Scaly: No Dry/Scaly: No Periwound Skin Color: Rubor: Yes Rubor:  Yes Atrophie Blanche: No Atrophie Blanche: No Atrophie Blanche: No Cyanosis: No Cyanosis: No Cyanosis: No Ecchymosis: No Ecchymosis: No Ecchymosis: No Erythema: No Erythema: No Erythema: No Hemosiderin Staining: No Hemosiderin Staining: No Hemosiderin Staining: No Mottled: No Mottled: No Mottled: No Pallor: No Pallor: No Pallor: No Rubor: No Temperature: No Abnormality No Abnormality N/A Tenderness on Palpation: Yes Yes No Wound Preparation: Ulcer Cleansing: Ulcer Cleansing: Ulcer Cleansing: Rinsed/Irrigated with Saline Rinsed/Irrigated with Saline Rinsed/Irrigated with Saline Topical Anesthetic Applied: None Sharon [name], [name] ([id number]) Wound Number: 7 N/A N/A Photos: N/A N/A Wound Location: Right Calcaneus N/A N/A Wounding Event: Gradually Appeared N/A N/A Primary Etiology: Diabetic Wound/Ulcer of the N/A N/A Lower Extremity Secondary Etiology: Pressure Ulcer N/A N/A Comorbid History: Cataracts, Anemia, N/A N/A Arrhythmia, Congestive Heart Failure, Hypertension, Type II Diabetes, Rheumatoid Arthritis, Osteoarthritis, Neuropathy, Anorexia/bulimia, Confinement Anxiety Date Acquired: 03/09/2018 N/A N/A Weeks of Treatment: 0 N/A N/A Wound Status: Open N/A N/A Clustered Wound: No N/A N/A Clustered Quantity: N/A N/A N/A Measurements L x W x D 3.5x6x0.1 N/A N/A (cm) Area (cm) : 16.493 N/A N/A Volume (cm) : 1.649 N/A N/A % Reduction in Area: N/A N/A N/A % Reduction in Volume: N/A N/A N/A Undermining: No N/A N/A Classification: Unable to visualize wound bed N/A N/A Exudate Amount: None Present N/A N/A Exudate Type: N/A N/A N/A Exudate Color: N/A N/A N/A Wound Margin: Distinct, outline attached N/A N/A Granulation Amount: None Present (0%) N/A N/A Granulation Quality: N/A N/A N/A Necrotic Amount: None Present (0%) N/A N/A Necrotic Tissue: N/A N/A N/A Exposed Structures: Fascia: No N/A N/A Fat Layer (Subcutaneous Tissue) Exposed: No Tendon:  No Muscle: No Joint: No Bone: No Epithelialization: Large (67-100%) N/A N/A Periwound Skin Texture: Excoriation: No N/A N/A Induration: No Callus: No Crepitus: No Rash: No Scarring: No Periwound Skin Moisture: N/A N/A Sharon [name], [name] ([id number]) Maceration: No Dry/Scaly: No Periwound Skin Color: Atrophie Blanche: No N/A N/A Cyanosis: No Ecchymosis: No Erythema: No Hemosiderin Staining: No Mottled: No Pallor: No Rubor: No Temperature: N/A N/A N/A Tenderness on Palpation: No N/A N/A Wound Preparation: Ulcer Cleansing: N/A N/A Rinsed/Irrigated with Saline Topical Anesthetic Applied: None Treatment Notes Electronic Signature(s) Signed: 03/24/2018 4:23:17 PM By: Montey Hora Entered By: Montey Hora on 03/23/2018 12:09:32 Sharon [name], Sharon [name] ([id number]) -------------------------------------------------------------------------------- Multi-Disciplinary Care Plan Details Patient Name: Sharon [name], [name] Date of Service: 03/23/2018 10:15 AM Medical Record Number: [id number] Patient Account Number: [id number] Date of Birth/Sex: 04/15/1933 (85 y.o. F) Treating RN: Montey Hora Primary Care Logyn Dedominicis: Emily Filbert Other Clinician: Referring Shaka Cardin: Jannifer Franklin, DAVID Treating Nery Frappier/Extender: Melburn Hake, HOYT Weeks in Treatment: 0 Active Inactive Abuse / Safety / Falls / Self Care Management Nursing Diagnoses: Potential for falls Goals: Patient will remain injury free related to falls Date Initiated: 03/23/2018 Target Resolution Date: 06/19/2018 Goal Status: Active Interventions: Assess fall risk on admission and as needed Notes: Orientation to the Wound Care Program Nursing Diagnoses: Knowledge deficit related to the wound healing center program Goals: Patient/caregiver will verbalize understanding of the [facility] Program Date Initiated: 03/23/2018 Target Resolution Date: 06/19/2018 Goal Status: Active Interventions: Provide education on orientation to  the wound center Notes: Venous Leg Ulcer  Nursing Diagnoses: Actual venous Insuffiency (use after diagnosis is confirmed) Goals: Patient will maintain optimal edema control Date Initiated: 03/23/2018 Target Resolution Date: 06/19/2018 Goal Status: Active Interventions: Assess peripheral edema status every visit. ROXAN, YAMAMOTO ([id number]) Compression as ordered Notes: Wound/Skin Impairment Nursing Diagnoses: Impaired tissue integrity Goals: Ulcer/skin breakdown will heal within 14 weeks Date Initiated: 03/23/2018 Target Resolution Date: 06/19/2018 Goal Status: Active Interventions: Assess patient/caregiver ability to obtain necessary supplies Assess patient/caregiver ability to perform ulcer/skin care regimen upon admission and as needed Assess ulceration(s) every visit Notes: Electronic Signature(s) Signed: 03/24/2018 4:23:17 PM By: Montey Hora Entered By: Montey Hora on 03/23/2018 12:09:16 Sharon [name], [name] ([id number]) -------------------------------------------------------------------------------- Pain Assessment Details Patient Name: Pousson, Kendrick P. Date of Service: 03/23/2018 10:15 AM Medical Record Number: [id number] Patient Account Number: [id number] Date of Birth/Sex: 1933-05-21 (85 y.o. F) Treating RN: Army Melia Primary Care Jevaughn Degollado: Emily Filbert Other Clinician: Referring Ramal Eckhardt: Jannifer Franklin, DAVID Treating [place] Emmitt/Extender: Melburn Hake, HOYT Weeks in Treatment: 0 Active Problems Location of Pain Severity and Description of Pain Patient Has Paino No Site Locations Pain Management and Medication Current Pain Management: Electronic Signature(s) Signed: 03/23/2018 4:39:27 PM By: Army Melia Entered By: Army Melia on 03/23/2018 10:52:56 Forcier, Sharon [name] ([id number]) -------------------------------------------------------------------------------- Patient/Caregiver Education Details Patient Name: Causby, Kerly P. Date of Service: 03/23/2018 10:15  AM Medical Record Number: [id number] Patient Account Number: [id number] Date of Birth/Gender: [birth year] (85 y.o. F) Treating RN: Montey Hora Primary Care Physician: Emily Filbert Other Clinician: Referring Physician: Jannifer Franklin, DAVID Treating Physician/Extender: Sharalyn Ink in Treatment: 0 Education Assessment Education Provided To: Patient and Caregiver Education Topics Provided Venous: Controlling Swelling with Multilayered Compression Wraps, Other: need for ongoing compression to prevent Handouts: further ulcerations Methods: Explain/Verbal Responses: State content correctly Electronic Signature(s) Signed: 03/24/2018 4:23:17 PM By: Montey Hora Entered By: Montey Hora on 03/23/2018 12:11:56 Cizek, Laquanda P. ([id number]) -------------------------------------------------------------------------------- Wound Assessment Details Patient Name: Tomaso, Elaisha P. Date of Service: 03/23/2018 10:15 AM Medical Record Number: [id number] Patient Account Number: [id number] Date of Birth/Sex: 04-24-1933 (85 y.o. F) Treating RN: Cornell Barman Primary Care Leilah Polimeni: Emily Filbert Other Clinician: Referring Colt Martelle: Jannifer Franklin, DAVID Treating Arleth Mccullar/Extender: Melburn Hake, HOYT Weeks in Treatment: 0 Wound Status Wound Number: 1 Primary Diabetic Wound/Ulcer of the Lower Extremity Etiology: Wound Location: Left Foot - Dorsal Wound Open Wounding Event: Gradually Appeared Status: Date Acquired: 03/09/2018 Comorbid Cataracts, Anemia, Arrhythmia, Congestive Heart Weeks Of Treatment: 0 History: Failure, Hypertension, Type II Diabetes, Clustered Wound: Yes Rheumatoid Arthritis, Osteoarthritis, Neuropathy, Anorexia/bulimia, Confinement Anxiety Photos Wound Measurements Length: (cm) 8 % Reduction i Width: (cm) 4.5 % Reduction i Depth: (cm) 0.1 Epithelializa Area: (cm) 28.274 Tunneling: Volume: (cm) 2.827 Undermining: n Area: 0% n Volume: 0% tion: Large (67-100%) No No Wound  Description Classification: Grade 2 Foul Odor Aft Wound Margin: Indistinct, nonvisible Slough/Fibrin Exudate Amount: Large Exudate Type: Serous Exudate Color: amber er Cleansing: No o Yes Wound Bed Granulation Amount: None Present (0%) Exposed Structure Necrotic Amount: Large (67-100%) Fascia Exposed: No Necrotic Quality: Adherent Slough Fat Layer (Subcutaneous Tissue) Exposed: Yes Tendon Exposed: No Muscle Exposed: No Joint Exposed: No Bone Exposed: No Periwound Skin Texture Bellmore, Dymon P. ([id number]) Texture Color No Abnormalities Noted: No No Abnormalities Noted: No Callus: No Atrophie Blanche: No Crepitus: No Cyanosis: No Excoriation: No Ecchymosis: No Induration: Yes Erythema: No Rash: No Hemosiderin Staining: No Scarring: No Mottled: No Pallor: No Moisture Rubor: Yes No Abnormalities Noted: No Dry / Scaly: No Temperature / Pain Maceration: Yes Temperature: No Abnormality Tenderness on Palpation: Yes  Wound Preparation Ulcer Cleansing: Rinsed/Irrigated with Saline Electronic Signature(s) Signed: 03/23/2018 11:43:48 AM By: Gretta Cool, BSN, RN, CWS, Kim RN, BSN Entered By: Gretta Cool, BSN, RN, CWS, Kim on 03/23/2018 11:43:48 Barclift, Sharon [name] ([id number]) -------------------------------------------------------------------------------- Wound Assessment Details Patient Name: Alegria, Shakirra P. Date of Service: 03/23/2018 10:15 AM Medical Record Number: [id number] Patient Account Number: [id number] Date of Birth/Sex: December 17, 1933 (85 y.o. F) Treating RN: Cornell Barman Primary Care Jareb Radoncic: Emily Filbert Other Clinician: Referring Avion Patella: Jannifer Franklin, DAVID Treating Letti Towell/Extender: Melburn Hake, HOYT Weeks in Treatment: 0 Wound Status Wound Number: 2 Primary Diabetic Wound/Ulcer of the Lower Extremity Etiology: Wound Location: Left Toe Second - Dorsal Wound Open Wounding Event: Gradually Appeared Status: Date Acquired: 03/09/2018 Comorbid Cataracts, Anemia, Arrhythmia,  Congestive Heart Weeks Of Treatment: 0 History: Failure, Hypertension, Type II Diabetes, Clustered Wound: No Rheumatoid Arthritis, Osteoarthritis, Neuropathy, Anorexia/bulimia, Confinement Anxiety Photos Wound Measurements Length: (cm) 0.7 Width: (cm) 0.4 Depth: (cm) 0.1 Area: (cm) 0.22 Volume: (cm) 0.022 % Reduction in Area: % Reduction in Volume: Epithelialization: None Tunneling: No Undermining: No Wound Description Classification: Unable to visualize wound bed Wound Margin: Flat and Intact Exudate Amount: None Present Foul Odor After Cleansing: No Slough/Fibrino No Wound Bed Granulation Amount: None Present (0%) Exposed Structure Necrotic Amount: Large (67-100%) Fascia Exposed: No Necrotic Quality: Eschar Fat Layer (Subcutaneous Tissue) Exposed: No Tendon Exposed: No Muscle Exposed: No Joint Exposed: No Bone Exposed: No Periwound Skin Texture Texture Color No Abnormalities Noted: No No Abnormalities Noted: No Veale, Reika P. ([id number]) Callus: No Atrophie Blanche: No Crepitus: No Cyanosis: No Excoriation: No Ecchymosis: No Induration: No Erythema: No Rash: No Hemosiderin Staining: No Scarring: No Mottled: No Pallor: No Moisture Rubor: Yes No Abnormalities Noted: No Dry / Scaly: No Temperature / Pain Maceration: No Temperature: No Abnormality Tenderness on Palpation: Yes Wound Preparation Ulcer Cleansing: Rinsed/Irrigated with Saline Topical Anesthetic Applied: None Electronic Signature(s) Signed: 03/23/2018 11:48:35 AM By: Gretta Cool, BSN, RN, CWS, Kim RN, BSN Entered By: Gretta Cool, BSN, RN, CWS, Kim on 03/23/2018 11:48:35 Tsui, Sharon [name] ([id number]) -------------------------------------------------------------------------------- Wound Assessment Details Patient Name: Traynham, Ellyson P. Date of Service: 03/23/2018 10:15 AM Medical Record Number: [id number] Patient Account Number: [id number] Date of Birth/Sex: Feb 06, 1933 (85 y.o. F) Treating RN:  Cornell Barman Primary Care Chancie Lampert: Emily Filbert Other Clinician: Referring Roya Gieselman: Jannifer Franklin, DAVID Treating Annalei Friesz/Extender: Melburn Hake, HOYT Weeks in Treatment: 0 Wound Status Wound Number: 3 Primary Lymphedema Etiology: Wound Location: Left Lower Leg - Anterior Wound Open Wounding Event: Gradually Appeared Status: Date Acquired: 03/09/2018 Comorbid Cataracts, Anemia, Arrhythmia, Congestive Heart Weeks Of Treatment: 0 History: Failure, Hypertension, Type II Diabetes, Clustered Wound: Yes Rheumatoid Arthritis, Osteoarthritis, Neuropathy, Anorexia/bulimia, Confinement Anxiety Photos Photo Uploaded By: Gretta Cool, BSN, RN, CWS, Kim on 03/23/2018 12:03:32 Wound Measurements Length: (cm) 15 Width: (cm) 9 Depth: (cm) 0.1 Clustered Quantity: 2 Area: (cm) 106.029 Volume: (cm) 10.603 % Reduction in Area: 0% % Reduction in Volume: 0% Epithelialization: None Tunneling: No Undermining: No Wound Description Full Thickness Without Exposed Support Foul Odo Classification: Structures Slough/F Wound Margin: Indistinct, nonvisible Exudate Large Amount: Exudate Type: Serous Exudate Color: amber r After Cleansing: No ibrino Yes Wound Bed Granulation Amount: Large (67-100%) Exposed Structure Necrotic Amount: Small (1-33%) Fascia Exposed: No Necrotic Quality: Adherent Slough Fat Layer (Subcutaneous Tissue) Exposed: Yes Tendon Exposed: No Muscle Exposed: No Hamad, Bentlee P. ([id number]) Joint Exposed: No Bone Exposed: No Periwound Skin Texture Texture Color No Abnormalities Noted: No No Abnormalities Noted: No Callus: No Atrophie Blanche: No Crepitus: No Cyanosis: No Excoriation: Yes Ecchymosis: No Induration: No  Erythema: No Rash: No Hemosiderin Staining: No Scarring: No Mottled: No Pallor: No Moisture Rubor: Yes No Abnormalities Noted: No Dry / Scaly: No Maceration: Yes Wound Preparation Ulcer Cleansing: Rinsed/Irrigated with Saline Topical Anesthetic  Applied: None Electronic Signature(s) Signed: 03/23/2018 11:50:54 AM By: Gretta Cool, BSN, RN, CWS, Kim RN, BSN Entered By: Gretta Cool, BSN, RN, CWS, Kim on 03/23/2018 11:50:54 Petrasek, Sharon [name] ([id number]) -------------------------------------------------------------------------------- Wound Assessment Details Patient Name: Ochs, Mareena P. Date of Service: 03/23/2018 10:15 AM Medical Record Number: [id number] Patient Account Number: [id number] Date of Birth/Sex: 07-04-1933 (85 y.o. F) Treating RN: Cornell Barman Primary Care Rafay Dahan: Emily Filbert Other Clinician: Referring Chasey Dull: Jannifer Franklin, DAVID Treating Sadik Piascik/Extender: Melburn Hake, HOYT Weeks in Treatment: 0 Wound Status Wound Number: 4 Primary Lymphedema Etiology: Wound Location: Right Lower Leg - Anterior Wound Open Wounding Event: Gradually Appeared Status: Date Acquired: 03/09/2018 Comorbid Cataracts, Anemia, Arrhythmia, Congestive Heart Weeks Of Treatment: 0 History: Failure, Hypertension, Type II Diabetes, Clustered Wound: No Rheumatoid Arthritis, Osteoarthritis, Neuropathy, Anorexia/bulimia, Confinement Anxiety Photos Photo Uploaded By: Gretta Cool, BSN, RN, CWS, Kim on 03/23/2018 12:03:33 Wound Measurements Length: (cm) 8.5 Width: (cm) 6 Depth: (cm) 0.1 Area: (cm) 40.055 Volume: (cm) 4.006 % Reduction in Area: 0% % Reduction in Volume: 0% Epithelialization: Large (67-100%) Tunneling: No Undermining: No Wound Description Classification: Partial Thickness Foul Odor Wound Margin: Indistinct, nonvisible Slough/Fi Exudate Amount: Large Exudate Type: Serous Exudate Color: amber After Cleansing: No brino No Wound Bed Granulation Amount: None Present (0%) Exposed Structure Necrotic Amount: None Present (0%) Fascia Exposed: No Fat Layer (Subcutaneous Tissue) Exposed: No Tendon Exposed: No Muscle Exposed: No Joint Exposed: No Bone Exposed: No Limited to Skin Breakdown Novicki, Emersyn P. ([id number]) Periwound Skin  Texture Texture Color No Abnormalities Noted: No No Abnormalities Noted: No Callus: No Atrophie Blanche: No Crepitus: No Cyanosis: No Excoriation: No Ecchymosis: No Induration: No Erythema: No Rash: No Hemosiderin Staining: No Scarring: No Mottled: No Pallor: No Moisture Rubor: Yes No Abnormalities Noted: No Dry / Scaly: No Temperature / Pain Maceration: Yes Temperature: No Abnormality Tenderness on Palpation: Yes Wound Preparation Ulcer Cleansing: Rinsed/Irrigated with Saline Electronic Signature(s) Signed: 03/23/2018 11:53:14 AM By: Gretta Cool, BSN, RN, CWS, Kim RN, BSN Entered By: Gretta Cool, BSN, RN, CWS, Kim on 03/23/2018 11:53:14 Gurney, Sharon [name] ([id number]) -------------------------------------------------------------------------------- Wound Assessment Details Patient Name: Sandberg, Shariah P. Date of Service: 03/23/2018 10:15 AM Medical Record Number: [id number] Patient Account Number: [id number] Date of Birth/Sex: [birth year] (85 y.o. F) Treating RN: Cornell Barman Primary Care Makhiya Coburn: Emily Filbert Other Clinician: Referring Yazhini Mcaulay: Jannifer Franklin, DAVID Treating Devynn Hessler/Extender: Melburn Hake, HOYT Weeks in Treatment: 0 Wound Status Wound Number: 5 Primary Lymphedema Etiology: Wound Location: Right Lower Leg - Lateral Wound Open Wounding Event: Gradually Appeared Status: Date Acquired: 03/09/2018 Comorbid Cataracts, Anemia, Arrhythmia, Congestive Heart Weeks Of Treatment: 0 History: Failure, Hypertension, Type II Diabetes, Clustered Wound: Yes Rheumatoid Arthritis, Osteoarthritis, Neuropathy, Anorexia/bulimia, Confinement Anxiety Photos Photo Uploaded By: Gretta Cool, BSN, RN, CWS, Kim on 03/23/2018 12:04:14 Wound Measurements Length: (cm) 10 Width: (cm) 5 Depth: (cm) 0.2 Area: (cm) 39.27 Volume: (cm) 7.854 % Reduction in Area: 0% % Reduction in Volume: 0% Epithelialization: Large (67-100%) Tunneling: No Undermining: No Wound Description Classification: Partial  Thickness Foul Odor Wound Margin: Indistinct, nonvisible Slough/Fi Exudate Amount: Large Exudate Type: Serous Exudate Color: amber After Cleansing: No brino No Wound Bed Granulation Amount: None Present (0%) Exposed Structure Necrotic Amount: Small (1-33%) Fascia Exposed: No Necrotic Quality: Adherent Slough Fat Layer (Subcutaneous Tissue) Exposed: No Tendon Exposed: No Muscle Exposed: No Joint Exposed: No Bone  Exposed: No Schollmeyer, Alicyn P. ([id number]) Periwound Skin Texture Texture Color No Abnormalities Noted: No No Abnormalities Noted: No Callus: No Atrophie Blanche: No Crepitus: No Cyanosis: No Excoriation: No Ecchymosis: No Induration: No Erythema: No Rash: No Hemosiderin Staining: No Scarring: No Mottled: No Pallor: No Moisture Rubor: Yes No Abnormalities Noted: No Dry / Scaly: No Temperature / Pain Maceration: Yes Temperature: No Abnormality Tenderness on Palpation: Yes Wound Preparation Ulcer Cleansing: Rinsed/Irrigated with Saline Electronic Signature(s) Signed: 03/23/2018 11:55:39 AM By: Gretta Cool, BSN, RN, CWS, Kim RN, BSN Entered By: Gretta Cool, BSN, RN, CWS, Kim on 03/23/2018 11:55:38 Bartha, Sharon [name] ([id number]) -------------------------------------------------------------------------------- Wound Assessment Details Patient Name: Burridge, Oniya P. Date of Service: 03/23/2018 10:15 AM Medical Record Number: [id number] Patient Account Number: [id number] Date of Birth/Sex: 04-22-1933 (85 y.o. F) Treating RN: Cornell Barman Primary Care Haroun Cotham: Emily Filbert Other Clinician: Referring Markeria Goetsch: Jannifer Franklin, DAVID Treating Shameria Trimarco/Extender: Melburn Hake, HOYT Weeks in Treatment: 0 Wound Status Wound Number: 6 Primary Pressure Ulcer Etiology: Wound Location: Left Calcaneus Wound Open Wounding Event: Gradually Appeared Status: Date Acquired: 03/08/2018 Comorbid Cataracts, Anemia, Arrhythmia, Congestive Heart Weeks Of Treatment: 0 History: Failure, Hypertension,  Type II Diabetes, Clustered Wound: No Rheumatoid Arthritis, Osteoarthritis, Neuropathy, Anorexia/bulimia, Confinement Anxiety Photos Photo Uploaded By: Gretta Cool, BSN, RN, CWS, Kim on 03/23/2018 12:04:15 Wound Measurements Length: (cm) 1.1 % Reducti Width: (cm) 1.2 % Reducti Depth: (cm) 0.2 Epithelia Area: (cm) 1.037 Tunnelin Volume: (cm) 0.207 Undermin Starti Ending Maximu on in Area: 0% on in Volume: 0% lization: None g: No ing: Yes ng Position (o'clock): 11 Position (o'clock): 12 m Distance: (cm) 1.6 Wound Description Classification: Category/Stage II Foul Odor Wound Margin: Flat and Intact Slough/Fi Exudate Amount: Large Exudate Type: Serous Exudate Color: amber After Cleansing: No brino Yes Wound Bed Granulation Amount: Small (1-33%) Exposed Structure Granulation Quality: Pink Fascia Exposed: No Necrotic Amount: Large (67-100%) Fat Layer (Subcutaneous Tissue) Exposed: Yes Necrotic Quality: Adherent Slough Tendon Exposed: No Rosenberger, Zaiah P. ([id number]) Muscle Exposed: No Joint Exposed: No Bone Exposed: No Periwound Skin Texture Texture Color No Abnormalities Noted: No No Abnormalities Noted: No Callus: No Atrophie Blanche: No Crepitus: No Cyanosis: No Excoriation: No Ecchymosis: No Induration: No Erythema: No Rash: No Hemosiderin Staining: No Scarring: No Mottled: No Pallor: No Moisture Rubor: No No Abnormalities Noted: No Dry / Scaly: No Maceration: Yes Wound Preparation Ulcer Cleansing: Rinsed/Irrigated with Saline Topical Anesthetic Applied: None Electronic Signature(s) Signed: 03/23/2018 12:00:15 PM By: Gretta Cool, BSN, RN, CWS, Kim RN, BSN Entered By: Gretta Cool, BSN, RN, CWS, Kim on 03/23/2018 12:00:15 Limpert, Sharon [name] ([id number]) -------------------------------------------------------------------------------- Wound Assessment Details Patient Name: Spera, Lamyah P. Date of Service: 03/23/2018 10:15 AM Medical Record Number:  [id number] Patient Account Number: [id number] Date of Birth/Sex: Aug 17, 1933 (85 y.o. F) Treating RN: Cornell Barman Primary Care Lyllian Gause: Emily Filbert Other Clinician: Referring Meli Faley: Jannifer Franklin, DAVID Treating Wilder Kurowski/Extender: Melburn Hake, HOYT Weeks in Treatment: 0 Wound Status Wound Number: 7 Primary Diabetic Wound/Ulcer of the Lower Extremity Etiology: Wound Location: Right Calcaneus Secondary Pressure Ulcer Wounding Event: Gradually Appeared Etiology: Date Acquired: 03/09/2018 Wound Open Weeks Of Treatment: 0 Status: Clustered Wound: No Comorbid Cataracts, Anemia, Arrhythmia, Congestive History: Heart Failure, Hypertension, Type II Diabetes, Rheumatoid Arthritis, Osteoarthritis, Neuropathy, Anorexia/bulimia, Confinement Anxiety Photos Photo Uploaded By: Gretta Cool, BSN, RN, CWS, Kim on 03/23/2018 12:04:45 Wound Measurements Length: (cm) 3.5 Width: (cm) 6 Depth: (cm) 0.1 Area: (cm) 16.493 Volume: (cm) 1.649 % Reduction in Area: % Reduction in Volume: Epithelialization: Large (67-100%) Tunneling: No Undermining: No Wound Description Classification: Unable to visualize wound  bed Foul Odor Wound Margin: Distinct, outline attached Slough/Fi Exudate Amount: None Present After Cleansing: No brino No Wound Bed Granulation Amount: None Present (0%) Exposed Structure Necrotic Amount: None Present (0%) Fascia Exposed: No Fat Layer (Subcutaneous Tissue) Exposed: No Tendon Exposed: No Muscle Exposed: No Joint Exposed: No Bone Exposed: No Justin, Marcelle P. ([id number]) Periwound Skin Texture Texture Color No Abnormalities Noted: No No Abnormalities Noted: No Callus: No Atrophie Blanche: No Crepitus: No Cyanosis: No Excoriation: No Ecchymosis: No Induration: No Erythema: No Rash: No Hemosiderin Staining: No Scarring: No Mottled: No Pallor: No Moisture Rubor: No No Abnormalities Noted: No Dry / Scaly: No Maceration: No Wound Preparation Ulcer Cleansing:  Rinsed/Irrigated with Saline Topical Anesthetic Applied: None Electronic Signature(s) Signed: 03/23/2018 12:02:08 PM By: Gretta Cool, BSN, RN, CWS, Kim RN, BSN Entered By: Gretta Cool, BSN, RN, CWS, Kim on 03/23/2018 12:02:08 Bennette, Sharon [name] ([id number]) -------------------------------------------------------------------------------- Vitals Details Patient Name: Leisure, Citlali P. Date of Service: 03/23/2018 10:15 AM Medical Record Number: [id number] Patient Account Number: [id number] Date of Birth/Sex: 1933/05/21 (85 y.o. F) Treating RN: Army Melia Primary Care Marcee Jacobs: Emily Filbert Other Clinician: Referring Daveda Larock: Jannifer Franklin, DAVID Treating Geena Weinhold/Extender: Melburn Hake, HOYT Weeks in Treatment: 0 Vital Signs Time Taken: 10:52 Temperature (F): 97.5 Height (in): 65 Pulse (bpm): 63 Source: Stated Respiratory Rate (breaths/min): 16 Weight (lbs): 155 Blood Pressure (mmHg): 98/61 Source: Stated Reference Range: 80 - 120 mg / dl Body Mass Index (BMI): 25.8 Electronic Signature(s) Signed: 03/23/2018 4:39:27 PM By: Army Melia Entered By: Army Melia on 03/23/2018 10:53:40

## 2018-03-27 ENCOUNTER — Other Ambulatory Visit: Payer: Self-pay

## 2018-03-27 ENCOUNTER — Emergency Department
Admission: EM | Admit: 2018-03-27 | Discharge: 2018-03-27 | Disposition: A | Discharge status: Home / Self Care | Payer: PPO | Attending: Emergency Medicine | Admitting: Emergency Medicine

## 2018-03-27 ENCOUNTER — Emergency Department: Payer: PPO

## 2018-03-27 DIAGNOSIS — Z87891 Personal history of nicotine dependence: Secondary | ICD-10-CM | POA: Insufficient documentation

## 2018-03-27 DIAGNOSIS — M79604 Pain in right leg: Secondary | ICD-10-CM | POA: Diagnosis not present

## 2018-03-27 DIAGNOSIS — I517 Cardiomegaly: Secondary | ICD-10-CM | POA: Diagnosis not present

## 2018-03-27 DIAGNOSIS — Z79899 Other long term (current) drug therapy: Secondary | ICD-10-CM | POA: Diagnosis not present

## 2018-03-27 DIAGNOSIS — R52 Pain, unspecified: Secondary | ICD-10-CM | POA: Diagnosis not present

## 2018-03-27 DIAGNOSIS — N390 Urinary tract infection, site not specified: Secondary | ICD-10-CM | POA: Diagnosis not present

## 2018-03-27 DIAGNOSIS — E119 Type 2 diabetes mellitus without complications: Secondary | ICD-10-CM | POA: Insufficient documentation

## 2018-03-27 DIAGNOSIS — R9431 Abnormal electrocardiogram [ECG] [EKG]: Secondary | ICD-10-CM | POA: Diagnosis not present

## 2018-03-27 DIAGNOSIS — M792 Neuralgia and neuritis, unspecified: Secondary | ICD-10-CM

## 2018-03-27 DIAGNOSIS — I509 Heart failure, unspecified: Secondary | ICD-10-CM | POA: Diagnosis not present

## 2018-03-27 DIAGNOSIS — I110 Hypertensive heart disease with heart failure: Secondary | ICD-10-CM | POA: Diagnosis not present

## 2018-03-27 DIAGNOSIS — M79605 Pain in left leg: Secondary | ICD-10-CM | POA: Diagnosis not present

## 2018-03-27 DIAGNOSIS — I4891 Unspecified atrial fibrillation: Secondary | ICD-10-CM | POA: Diagnosis not present

## 2018-03-27 LAB — CBC WITH DIFFERENTIAL/PLATELET
Abs Immature Granulocytes: 0.07 10*3/uL (ref 0.00–0.07)
Basophils Absolute: 0.1 10*3/uL (ref 0.0–0.1)
Basophils Relative: 1 %
Eosinophils Absolute: 0.3 10*3/uL (ref 0.0–0.5)
Eosinophils Relative: 3 %
HCT: 33.6 % — ABNORMAL LOW (ref 36.0–46.0)
Hemoglobin: 11 g/dL — ABNORMAL LOW (ref 12.0–15.0)
Immature Granulocytes: 1 %
Lymphocytes Relative: 26 %
Lymphs Abs: 2.6 10*3/uL (ref 0.7–4.0)
MCH: 28.6 pg (ref 26.0–34.0)
MCHC: 32.7 g/dL (ref 30.0–36.0)
MCV: 87.5 fL (ref 80.0–100.0)
Monocytes Absolute: 0.8 10*3/uL (ref 0.1–1.0)
Monocytes Relative: 8 %
NRBC: 0 % (ref 0.0–0.2)
Neutro Abs: 6.3 10*3/uL (ref 1.7–7.7)
Neutrophils Relative %: 61 %
Platelets: 324 10*3/uL (ref 150–400)
RBC: 3.84 MIL/uL — ABNORMAL LOW (ref 3.87–5.11)
RDW: 13.6 % (ref 11.5–15.5)
WBC: 10.1 10*3/uL (ref 4.0–10.5)

## 2018-03-27 LAB — COMPREHENSIVE METABOLIC PANEL
ALK PHOS: 85 U/L (ref 38–126)
ALT: 21 U/L (ref 0–44)
AST: 30 U/L (ref 15–41)
Albumin: 3.4 g/dL — ABNORMAL LOW (ref 3.5–5.0)
Anion gap: 12 (ref 5–15)
BUN: 32 mg/dL — ABNORMAL HIGH (ref 8–23)
CO2: 22 mmol/L (ref 22–32)
Calcium: 8.7 mg/dL — ABNORMAL LOW (ref 8.9–10.3)
Chloride: 100 mmol/L (ref 98–111)
Creatinine, Ser: 1.73 mg/dL — ABNORMAL HIGH (ref 0.44–1.00)
GFR calc Af Amer: 31 mL/min — ABNORMAL LOW (ref >60)
GFR calc non Af Amer: 27 mL/min — ABNORMAL LOW (ref >60)
Glucose, Bld: 181 mg/dL — ABNORMAL HIGH (ref 70–99)
Potassium: 3.6 mmol/L (ref 3.5–5.1)
Sodium: 134 mmol/L — ABNORMAL LOW (ref 135–145)
Total Bilirubin: 0.5 mg/dL (ref 0.3–1.2)
Total Protein: 6.7 g/dL (ref 6.5–8.1)

## 2018-03-27 LAB — URINALYSIS, COMPLETE (UACMP) WITH MICROSCOPIC
Bilirubin Urine: NEGATIVE
Glucose, UA: NEGATIVE mg/dL
Ketones, ur: NEGATIVE mg/dL
Nitrite: NEGATIVE
PH: 5 (ref 5.0–8.0)
Protein, ur: 100 mg/dL — AB
Specific Gravity, Urine: 1.01 (ref 1.005–1.030)
WBC, UA: >50 WBC/hpf — ABNORMAL HIGH (ref 0–5)

## 2018-03-27 LAB — LACTIC ACID, PLASMA: Lactic Acid, Venous: 1.4 mmol/L (ref 0.5–1.9)

## 2018-03-27 LAB — TROPONIN I

## 2018-03-27 MED ORDER — TRAZODONE HCL 50 MG PO TABS
50.0000 mg | ORAL_TABLET | Freq: Every day | ORAL | Status: DC
  Administered 2018-03-27: 50 mg via ORAL
  Filled 2018-03-27: qty 1

## 2018-03-27 MED ORDER — TRAMADOL HCL 50 MG PO TABS
50.0000 mg | ORAL_TABLET | Freq: Once | ORAL | Status: AC
  Administered 2018-03-27: 50 mg via ORAL
  Filled 2018-03-27: qty 1

## 2018-03-27 MED ORDER — SODIUM CHLORIDE 0.9 % IV BOLUS
500.0000 mL | Freq: Once | INTRAVENOUS | Status: AC
  Administered 2018-03-27: 500 mL via INTRAVENOUS

## 2018-03-27 MED ORDER — CEPHALEXIN 250 MG PO CAPS: 250.0000 mg | ORAL_CAPSULE | Freq: Three times a day (TID) | ORAL | 0 refills | Status: DC

## 2018-03-27 MED ORDER — GABAPENTIN 100 MG PO CAPS
200.0000 mg | ORAL_CAPSULE | Freq: Once | ORAL | Status: AC
  Administered 2018-03-27: 200 mg via ORAL
  Filled 2018-03-27: qty 2

## 2018-03-27 MED ORDER — TRAMADOL HCL 50 MG PO TABS: 50.0000 mg | ORAL_TABLET | Freq: Four times a day (QID) | ORAL | 0 refills | Status: DC | PRN

## 2018-03-27 MED ORDER — GABAPENTIN 100 MG PO CAPS: ORAL_CAPSULE | ORAL | 0 refills | Status: DC

## 2018-03-27 MED ORDER — SODIUM CHLORIDE 0.9 % IV SOLN
1.0000 g | Freq: Once | INTRAVENOUS | Status: AC
  Administered 2018-03-27: 1 g via INTRAVENOUS
  Filled 2018-03-27: qty 10

## 2018-03-27 NOTE — ED Provider Notes (Signed)
[facility] Emergency Department Provider Note   ____________________________________________   First MD Initiated Contact with Patient 03/27/18 [phone]     (approximate)  I have reviewed the triage vital signs and the nursing notes.   HISTORY  Chief Complaint Leg Pain (bilateral)    HPI Sharon [name] is a 85 y.o. female brought to the ED via EMS from [facility] for bilateral leg pain.  Patient has a history of CHF, atrial fibrillation, diabetes, lymphedema who was in the hospital 3 weeks ago for CHF.  She was diuresed but discharged back to the nursing facility without her Lasix.  Son states a week later her legs were more edematous and she was placed back on Lasix 20 mg daily.  She was seen by vascular and her legs were wrapped for lymphedema.  Had a follow-up with her PCP just 3 days ago.  Son states PCP took her off gabapentin and trazodone which she has been taking for years.  Son does not understand why she was taken off of these 2 medications and thinks patient needs them.  Patient complains of neuropathic pain to bilateral soles of feet.  Denies fever, chills, chest pain, shortness of breath, abdominal pain, nausea, vomiting.  Patient is largely nonambulatory.       Past Medical History:  Diagnosis Date  . Anemia   . Atrial fibrillation ([place])   . Cardiac arrest ([place])   . Cataract   . CHF (congestive heart failure) ([place])   . Diabetes mellitus without complication ([place])   . Edema    feet/ankles occas  . Hip fracture ([place])   . Hyperlipidemia   . Hypertension   . Hypothyroid   . Neuropathy   . Osteomyelitis ([place])    left first metatarsal  . Stroke [facility])     Patient Active Problem List   Diagnosis Date Noted  . Lymphedema 03/24/2018  . Pressure injury of skin 03/12/2018  . Atherosclerosis of native arteries of the extremities with ulceration ([place]) 05/07/2017  . Non-healing ulcer ([place]) 04/16/2017  . Diabetic neuropathy ([place]) 04/07/2017  .  Septic shock ([place]) 03/20/2017  . CKD (chronic kidney disease), stage III ([place]) 03/19/2017  . Goals of care, counseling/discussion 03/03/2017  . PAD (peripheral artery disease) ([place]) 02/03/2017  . Abdominal aortic stenosis 02/03/2017  . Bilateral lower extremity edema 02/03/2017  . Anemia 01/29/2017  . B12 deficiency 01/29/2017  . Pancreatic mass 01/29/2017  . Hyponatremia 07/07/2015  . Foot ulcer ([place]) 02/07/2015  . Acute on chronic renal failure ([place]) 11/05/2014  . Diabetic foot infection ([place]) 10/10/2014  . Chronic diastolic heart failure ([place]) 05/31/2014  . HTN (hypertension) 05/31/2014  . DM (diabetes mellitus), type 2, uncontrolled ([place]) 05/31/2014    Past Surgical History:  Procedure Laterality Date  . ACHILLES TENDON SURGERY Left 02/08/2015   Procedure: ACHILLES LENGTHENING/KIDNER;  Surgeon: Albertine Patricia, DPM;  Location: [facility] ORS;  Service: Podiatry;  Laterality: Left;  . APPENDECTOMY    . CARDIAC CATHETERIZATION  08/25/13  . CATARACT EXTRACTION W/PHACO Left 07/20/2014   Procedure: CATARACT EXTRACTION PHACO AND INTRAOCULAR LENS PLACEMENT (IOC);  Surgeon: Leandrew Koyanagi, MD;  Location: [facility] ORS;  Service: Ophthalmology;  Laterality: Left;  [place]  1:18                 AP     23.6             CDE   9.69      lot #[phone]  . CHOLECYSTECTOMY    .  CORONARY ANGIOPLASTY    . HALLUX VALGUS AKIN Left 02/08/2015   Procedure: HALLUX VALGUS AKIN/ KELLER;  Surgeon: Albertine Patricia, DPM;  Location: [facility] ORS;  Service: Podiatry;  Laterality: Left;  IVA with Local needs 1 hour for this case   . HEMIARTHROPLASTY HIP Right   . HEMIARTHROPLASTY HIP Left   . INCISION AND DRAINAGE Left 10/11/2014   Procedure: Removal of infected tibial sessmoid;  Surgeon: Albertine Patricia, DPM;  Location: [facility] ORS;  Service: Podiatry;  Laterality: Left;  . IRRIGATION AND DEBRIDEMENT FOOT Right 03/21/2017   Procedure: IRRIGATION AND DEBRIDEMENT FOOT right great toe amputation;  Surgeon: Sharlotte Alamo, DPM;   Location: [facility] ORS;  Service: Podiatry;  Laterality: Right;  . IRRIGATION AND DEBRIDEMENT FOOT Right 04/18/2017   Procedure: IRRIGATION AND DEBRIDEMENT FOOT and application wound vac;  Surgeon: Albertine Patricia, DPM;  Location: [facility] ORS;  Service: Podiatry;  Laterality: Right;  . PERIPHERAL VASCULAR BALLOON ANGIOPLASTY Right 04/17/2017   Procedure: PERIPHERAL VASCULAR BALLOON ANGIOPLASTY;  Surgeon: Katha Cabal, MD;  Location: [place] [facility];  Service: Cardiovascular;  Laterality: Right;    Prior to Admission medications   Medication Sig Start Date End Date Taking? Authorizing Provider  acetaminophen (TYLENOL) 500 MG tablet Take 1,000 mg by mouth at bedtime. For pain management    [provider]  acetaminophen (TYLENOL) 650 MG CR tablet Take 650 mg by mouth every 8 (eight) hours as needed. For discomfort    [provider]  Amino Acids-Protein Hydrolys (FEEDING SUPPLEMENT, PRO-STAT SUGAR FREE 64,) LIQD Take 30 mLs by mouth 2 (two) times daily between meals.    [provider]  amiodarone (PACERONE) 200 MG tablet Take 1 tablet (200 mg total) by mouth daily. 03/24/17   Gladstone Lighter, MD  amitriptyline (ELAVIL) 10 MG tablet Take 10 mg by mouth daily.     [provider]  atorvastatin (LIPITOR) 10 MG tablet Take 10 mg by mouth daily at 10 pm.     [provider]  bisacodyl (DULCOLAX) 5 MG EC tablet Take 2 tablets (10 mg total) by mouth every other day. Patient not taking: Reported on 03/09/2018 03/25/17   Gladstone Lighter, MD  cephALEXin (KEFLEX) 250 MG capsule Take 1 capsule (250 mg total) by mouth 3 (three) times daily. 03/27/18   Paulette Blanch, MD  cholecalciferol (VITAMIN D) 1000 units tablet Take 1,000 Units by mouth daily.    [provider]  collagenase (SANTYL) ointment Apply topically daily. 03/12/18   Stark Jock Jude, MD  ferrous sulfate 325 (65 FE) MG tablet Take 325 mg by mouth daily.     [provider]  furosemide  (LASIX) 20 MG tablet  03/15/18   [provider]  gabapentin (NEURONTIN) 100 MG capsule 2 capsules three times daily for neuropathy 03/27/18   Paulette Blanch, MD  glimepiride (AMARYL) 4 MG tablet Take 4 mg by mouth daily.     [provider]  GLUCERNA (GLUCERNA) LIQD Take 237 mLs by mouth 2 (two) times daily between meals.    [provider]  JANUVIA 50 MG tablet Take 50 mg by mouth daily. Take along with Amaryl 02/15/18   [provider]  levothyroxine (SYNTHROID, LEVOTHROID) 112 MCG tablet Take 112 mcg by mouth daily before breakfast. 30 minutes before breakfast    [provider]  metoprolol tartrate (LOPRESSOR) 25 MG tablet Take 1 tablet (25 mg total) by mouth 2 (two) times daily. 04/21/17   Saundra Shelling, MD  mupirocin ointment (  BACTROBAN) 2 % Place 1 application into the nose 2 (two) times daily.    [provider]  senna-docusate (SENOKOT-S) 8.6-50 MG tablet Take 1 tablet by mouth 2 (two) times daily. Patient not taking: Reported on 03/09/2018 03/23/17   Gladstone Lighter, MD  traMADol (ULTRAM) 50 MG tablet Take 1 tablet (50 mg total) by mouth every 6 (six) hours as needed for moderate pain. 03/27/18   Paulette Blanch, MD  traZODone (DESYREL) 50 MG tablet Take 50 mg by mouth at bedtime. For anxiety 06/24/17 06/24/18  [provider]  vitamin C (ASCORBIC ACID) 500 MG tablet Take 500 mg by mouth daily.     [provider]    Allergies Carbamazepine; Cymbalta [duloxetine hcl]; Duloxetine; Lyrica [pregabalin]; and Temazepam  Family History  Problem Relation Age of Onset  . Heart attack Mother     Social History Social History   Tobacco Use  . Smoking status: Former Smoker    Packs/day: 0.50    Years: 7.00    Pack years: 3.50    Types: Cigarettes    Last attempt to quit: 05/30/1989    Years since quitting: 28.8  . Smokeless tobacco: Never Used  Substance Use Topics  . Alcohol use: No    Alcohol/week: 0.0 standard drinks   . Drug use: No    Review of Systems  Constitutional: No fever/chills Eyes: No visual changes. ENT: No sore throat. Cardiovascular: Denies chest pain. Respiratory: Denies shortness of breath. Gastrointestinal: No abdominal pain.  No nausea, no vomiting.  No diarrhea.  No constipation. Genitourinary: Negative for dysuria. Musculoskeletal: Positive for bilateral pain in the soles of her feet.  Negative for back pain. Skin: Negative for rash. Neurological: Negative for headaches, focal weakness or numbness.   ____________________________________________   PHYSICAL EXAM:  VITAL SIGNS: ED Triage Vitals  Enc Vitals Group     BP 03/27/18 0016 (!) 151/73     Pulse Rate 03/27/18 0016 (!) 56     Resp 03/27/18 0016 17     Temp 03/27/18 0016 97.6 F (36.4 C)     Temp Source 03/27/18 0016 Oral     SpO2 03/27/18 0016 96 %     Weight 03/27/18 0017 156 lb 8.4 oz (71 kg)     Height --      Head Circumference --      Peak Flow --      Pain Score 03/27/18 0017 9     Pain Loc --      Pain Edu? --      Excl. in [place]? --     Constitutional: Alert and oriented. Well appearing and in mild acute distress. Eyes: Conjunctivae are normal. PERRL. EOMI. Head: Atraumatic. Nose: No congestion/rhinnorhea. Mouth/Throat: Mucous membranes are moist.  Oropharynx non-erythematous. Neck: No stridor.   Cardiovascular: Normal rate, regular rhythm. Grossly normal heart sounds.  Good peripheral circulation. Respiratory: Normal respiratory effort.  No retractions. Lungs CTAB. Gastrointestinal: Soft and nontender. No distention. No abdominal bruits. No CVA tenderness. Musculoskeletal: BLE wrapped in compression bandages.  1+ pitting BLE edema.  2+ femoral pulses. Neurologic:  Normal speech and language. No gross focal neurologic deficits are appreciated.  Skin:  Skin is warm, dry and intact. No rash noted. Psychiatric: Mood and affect are normal. Speech and behavior are normal.   ____________________________________________   LABS (all labs ordered are listed, but only abnormal results are displayed)  Labs Reviewed  CBC WITH DIFFERENTIAL/PLATELET - Abnormal; Notable for the following components:  Result Value   RBC 3.84 (*)    Hemoglobin 11.0 (*)    HCT 33.6 (*)    All other components within normal limits  COMPREHENSIVE METABOLIC PANEL - Abnormal; Notable for the following components:   Sodium 134 (*)    Glucose, Bld 181 (*)    BUN 32 (*)    Creatinine, Ser 1.73 (*)    Calcium 8.7 (*)    Albumin 3.4 (*)    GFR calc non Af Amer 27 (*)    GFR calc Af Amer 31 (*)    All other components within normal limits  URINALYSIS, COMPLETE (UACMP) WITH MICROSCOPIC - Abnormal; Notable for the following components:   Color, Urine YELLOW (*)    APPearance CLOUDY (*)    Hgb urine dipstick SMALL (*)    Protein, ur 100 (*)    Leukocytes,Ua LARGE (*)    WBC, UA >50 (*)    Bacteria, UA MANY (*)    All other components within normal limits  CULTURE, BLOOD (ROUTINE X 2)  CULTURE, BLOOD (ROUTINE X 2)  URINE CULTURE  TROPONIN I  LACTIC ACID, PLASMA   ____________________________________________  EKG  ED ECG REPORT I, Khushboo Chuck J, the attending physician, personally viewed and interpreted this ECG.   Date: 03/27/2018  EKG Time: 0029  Rate: 56  Rhythm: normal EKG, normal sinus rhythm  Axis: Normal  Intervals:none  ST&T Change: Nonspecific  ____________________________________________  RADIOLOGY  ED MD interpretation: No pneumonia  Official radiology report(s): Dg Chest Port 1 View  Result Date: 03/27/2018 CLINICAL DATA:  Bilateral leg pain. EXAM: PORTABLE CHEST 1 VIEW COMPARISON:  03/08/2018 FINDINGS: Mild cardiac enlargement. No vascular congestion, edema, or consolidation. Central interstitial pattern with mild bronchiectasis and bronchial wall thickening likely representing chronic bronchitis. No blunting of costophrenic angles. No pneumothorax.  Calcification of the aorta. Degenerative changes in the spine and shoulders. IMPRESSION: Cardiac enlargement. Chronic bronchitic changes in the lungs. No evidence of active pulmonary disease. Electronically Signed   By: Lucienne Capers M.D.   On: 03/27/2018 00:54    ____________________________________________   PROCEDURES  Procedure(s) performed (including Critical Care):  Procedures   ____________________________________________   INITIAL IMPRESSION / ASSESSMENT AND PLAN / ED COURSE  As part of my medical decision making, I reviewed the following data within the [place] History obtained from family, Nursing notes reviewed and incorporated, Labs reviewed, EKG interpreted, Old chart reviewed, Radiograph reviewed and Notes from prior ED visits        85 year old female with lymphedema who presents with neuropathic pain in the soles of her feet.  Will evaluate her for infection.  I personally viewed patient's most recent office visit on 03/24/2018.  There does not seem to be a definitive reason why she was taking off her gabapentin.  Son is insistent she resumes this.   Clinical Course as of Mar 27 722  Sat Mar 27, 2018  0219 Patient feeling better.  Updated patient and son on all test results.  IV Rocephin infusing.  Will discharge back to facility placing patient back on her gabapentin and trazodone.  In addition she will go back on Keflex for UTI.  I will send a message to patient's PCP through epic notifying him that she is back on these medicines per both hers and her her son's request.   [JS]    Clinical Course User Index [JS] Paulette Blanch, MD     ____________________________________________   FINAL CLINICAL IMPRESSION(S) / ED DIAGNOSES  Final diagnoses:  Urinary tract infection without hematuria, site unspecified  Neuropathic pain  Bilateral leg pain     ED Discharge Orders         Ordered    cephALEXin (KEFLEX) 250 MG capsule  3 times daily      03/27/18 0317    gabapentin (NEURONTIN) 100 MG capsule  Status:  Discontinued     03/27/18 0317    traMADol (ULTRAM) 50 MG tablet  Every 6 hours PRN     03/27/18 0317    gabapentin (NEURONTIN) 100 MG capsule     03/27/18 0319           Note:  This document was prepared using Dragon voice recognition software and may include unintentional dictation errors.   Paulette Blanch, MD 03/27/18 [phone]

## 2018-03-27 NOTE — ED Notes (Addendum)
Reviewed discharge instructions, follow-up care, and prescriptions with patient, patient's son, and Drucilla from [facility]. Patient verbalized understanding of all information reviewed. Patient stable, with no distress noted at this time.

## 2018-03-27 NOTE — ED Triage Notes (Addendum)
Patient coming [facility] from [facility] for bilateral leg pain, although right leg is more significant. Bandages applied to bilateral lower legs. Patient reports hx of recent hx to legs, however is unsure when or what the surgery was.   Patient further reports she was recently taken off of her trazadone and gabapentin.

## 2018-03-27 NOTE — ED Notes (Signed)
ED Provider at bedside. 

## 2018-03-27 NOTE — Discharge Instructions (Signed)
I have restarted your Gabapentin and Tramadol.  Please speak with Dr. Sabra Heck whether or not you should continue these medicines.  Please take Keflex as prescribed for UTI.  Return to the ER for worsening symptoms, persistent vomiting, difficulty breathing or other concerns.

## 2018-03-28 ENCOUNTER — Telehealth: Payer: Self-pay | Admitting: Internal Medicine

## 2018-03-28 LAB — URINE CULTURE: Culture: >=100000 — AB

## 2018-03-28 MED ORDER — GABAPENTIN 100 MG PO CAPS: 200.0000 mg | ORAL_CAPSULE | Freq: Three times a day (TID) | ORAL | 11 refills | Status: DC

## 2018-03-28 NOTE — Telephone Encounter (Signed)
-----   Message from Paulette Blanch, MD sent at 03/27/2018  3:20 AM EDT ----- Regarding: Patient FYI Hi Dr. Sabra Heck,  I saw Ms. Loughridge overnight in the ED for neuropathic pain to her feet and legs. At her and her son's request, I restarted her Gabapentin and Tramadol which were d/c'd on Wednesday. Her son was unclear why these medicines were discontinued. I wrote her a prescription for enough to get her through the weekend and her son will call your office for an appointment next week.  Thank you, Lurline Hare

## 2018-03-29 ENCOUNTER — Ambulatory Visit: Payer: PPO | Admitting: Physical Therapy

## 2018-03-29 ENCOUNTER — Encounter: Payer: PPO | Admitting: Occupational Therapy

## 2018-03-29 DIAGNOSIS — I70203 Unspecified atherosclerosis of native arteries of extremities, bilateral legs: Secondary | ICD-10-CM | POA: Diagnosis not present

## 2018-03-29 DIAGNOSIS — E1151 Type 2 diabetes mellitus with diabetic peripheral angiopathy without gangrene: Secondary | ICD-10-CM | POA: Diagnosis not present

## 2018-03-29 DIAGNOSIS — I2510 Atherosclerotic heart disease of native coronary artery without angina pectoris: Secondary | ICD-10-CM | POA: Diagnosis not present

## 2018-03-29 DIAGNOSIS — N183 Chronic kidney disease, stage 3 (moderate): Secondary | ICD-10-CM | POA: Diagnosis not present

## 2018-03-29 DIAGNOSIS — S81802D Unspecified open wound, left lower leg, subsequent encounter: Secondary | ICD-10-CM | POA: Diagnosis not present

## 2018-03-29 DIAGNOSIS — I5033 Acute on chronic diastolic (congestive) heart failure: Secondary | ICD-10-CM | POA: Diagnosis not present

## 2018-03-29 DIAGNOSIS — I130 Hypertensive heart and chronic kidney disease with heart failure and stage 1 through stage 4 chronic kidney disease, or unspecified chronic kidney disease: Secondary | ICD-10-CM | POA: Diagnosis not present

## 2018-03-29 DIAGNOSIS — N170 Acute kidney failure with tubular necrosis: Secondary | ICD-10-CM | POA: Diagnosis not present

## 2018-03-30 ENCOUNTER — Encounter: Payer: PPO | Admitting: Physician Assistant

## 2018-03-30 ENCOUNTER — Other Ambulatory Visit: Payer: Self-pay

## 2018-03-30 DIAGNOSIS — F502 Bulimia nervosa: Secondary | ICD-10-CM | POA: Diagnosis not present

## 2018-03-30 DIAGNOSIS — E11621 Type 2 diabetes mellitus with foot ulcer: Secondary | ICD-10-CM | POA: Diagnosis not present

## 2018-03-30 DIAGNOSIS — E1140 Type 2 diabetes mellitus with diabetic neuropathy, unspecified: Secondary | ICD-10-CM | POA: Diagnosis not present

## 2018-03-30 DIAGNOSIS — I509 Heart failure, unspecified: Secondary | ICD-10-CM | POA: Diagnosis not present

## 2018-03-30 DIAGNOSIS — L97812 Non-pressure chronic ulcer of other part of right lower leg with fat layer exposed: Secondary | ICD-10-CM | POA: Diagnosis not present

## 2018-03-30 DIAGNOSIS — L97512 Non-pressure chronic ulcer of other part of right foot with fat layer exposed: Secondary | ICD-10-CM | POA: Diagnosis not present

## 2018-03-30 DIAGNOSIS — Z888 Allergy status to other drugs, medicaments and biological substances status: Secondary | ICD-10-CM | POA: Diagnosis not present

## 2018-03-30 DIAGNOSIS — I110 Hypertensive heart disease with heart failure: Secondary | ICD-10-CM | POA: Diagnosis not present

## 2018-03-30 DIAGNOSIS — Z8249 Family history of ischemic heart disease and other diseases of the circulatory system: Secondary | ICD-10-CM | POA: Diagnosis not present

## 2018-03-30 DIAGNOSIS — I890 Lymphedema, not elsewhere classified: Secondary | ICD-10-CM | POA: Diagnosis not present

## 2018-03-30 DIAGNOSIS — M069 Rheumatoid arthritis, unspecified: Secondary | ICD-10-CM | POA: Diagnosis not present

## 2018-03-30 DIAGNOSIS — D649 Anemia, unspecified: Secondary | ICD-10-CM | POA: Diagnosis not present

## 2018-03-30 DIAGNOSIS — L97822 Non-pressure chronic ulcer of other part of left lower leg with fat layer exposed: Secondary | ICD-10-CM | POA: Diagnosis not present

## 2018-03-30 DIAGNOSIS — I4891 Unspecified atrial fibrillation: Secondary | ICD-10-CM | POA: Diagnosis not present

## 2018-03-30 DIAGNOSIS — L97522 Non-pressure chronic ulcer of other part of left foot with fat layer exposed: Secondary | ICD-10-CM | POA: Diagnosis not present

## 2018-03-30 DIAGNOSIS — Z87891 Personal history of nicotine dependence: Secondary | ICD-10-CM | POA: Diagnosis not present

## 2018-03-30 DIAGNOSIS — F419 Anxiety disorder, unspecified: Secondary | ICD-10-CM | POA: Diagnosis not present

## 2018-03-30 DIAGNOSIS — Z823 Family history of stroke: Secondary | ICD-10-CM | POA: Diagnosis not present

## 2018-03-30 DIAGNOSIS — L89622 Pressure ulcer of left heel, stage 2: Secondary | ICD-10-CM | POA: Diagnosis not present

## 2018-03-31 ENCOUNTER — Ambulatory Visit: Payer: PPO | Admitting: Physical Therapy

## 2018-03-31 ENCOUNTER — Encounter: Payer: PPO | Admitting: Occupational Therapy

## 2018-03-31 NOTE — Progress Notes (Signed)
DALE, STRAUSSER ([id number]) Visit Report for 03/30/2018 Arrival Information Details Patient Name: Sharon [name], Sharon [name]. Date of Service: 03/30/2018 9:45 AM Medical Record Number: [id number] Patient Account Number: [id number] Date of Birth/Sex: [birth year] (85 y.o. F) Treating RN: Army Melia Primary Care Kenedy Haisley: Emily Filbert Other Clinician: Referring Loretta Kluender: Emily Filbert Treating Shatisha Falter/Extender: Melburn Hake, HOYT Weeks in Treatment: 1 Visit Information History Since Last Visit Added or deleted any medications: No Patient Arrived: Wheel Chair Any new allergies or adverse reactions: No Arrival Time: 09:43 Had a fall or experienced change in No Accompanied By: friend activities of daily living that may affect Transfer Assistance: None risk of falls: Signs or symptoms of abuse/neglect since last visito No Hospitalized since last visit: No Has Dressing in Place as Prescribed: Yes Pain Present Now: No Electronic Signature(s) Signed: 03/30/2018 3:57:00 PM By: Army Melia Entered By: Army Melia on 03/30/2018 09:45:26 Sharon [name], Sharon [name] ([id number]) -------------------------------------------------------------------------------- Encounter Discharge Information Details Patient Name: Sharon [name], [name] Date of Service: 03/30/2018 9:45 AM Medical Record Number: [id number] Patient Account Number: [id number] Date of Birth/Sex: May 09, 1933 (85 y.o. F) Treating RN: Montey Hora Primary Care Ramaj Frangos: Emily Filbert Other Clinician: Referring Ayodeji Keimig: Emily Filbert Treating Garvis Downum/Extender: Melburn Hake, HOYT Weeks in Treatment: 1 Encounter Discharge Information Items Post Procedure Vitals Discharge Condition: Stable Temperature (F): 97.8 Ambulatory Status: Wheelchair Pulse (bpm): 65 Discharge Destination: Home Respiratory Rate (breaths/min): 16 Transportation: Private Auto Blood Pressure (mmHg): 192/47 Accompanied By: friend Schedule Follow-up Appointment: Yes Clinical Summary of  Care: Electronic Signature(s) Signed: 03/30/2018 4:44:55 PM By: Montey Hora Entered By: Montey Hora on 03/30/2018 10:31:36 Sharon [name], [name] ([id number]) -------------------------------------------------------------------------------- Lower Extremity Assessment Details Patient Name: Dimmick, Tomeika P. Date of Service: 03/30/2018 9:45 AM Medical Record Number: [id number] Patient Account Number: [id number] Date of Birth/Sex: Mar 30, 1933 (85 y.o. F) Treating RN: Army Melia Primary Care Envy Meno: Emily Filbert Other Clinician: Referring Latalia Etzler: Emily Filbert Treating Edwin Baines/Extender: Melburn Hake, HOYT Weeks in Treatment: 1 Edema Assessment Assessed: [Left: No] [Right: No] Edema: [Left: No] [Right: No] Vascular Assessment Pulses: Dorsalis Pedis Palpable: [Left:Yes] [Right:Yes] Posterior Tibial Extremity colors, hair growth, and conditions: Extremity Color: [Left:Red] [Right:Red] Hair Growth on Extremity: [Left:No] [Right:No] Temperature of Extremity: [Left:Warm] [Right:Warm] Capillary Refill: [Left:< 3 seconds] [Right:< 3 seconds] Toe Nail Assessment Left: Right: Thick: Yes Yes Discolored: Yes Yes Deformed: No No Improper Length and Hygiene: No No Electronic Signature(s) Signed: 03/30/2018 3:57:00 PM By: Army Melia Entered By: Army Melia on 03/30/2018 10:04:28 Sharon [name], [name] ([id number]) -------------------------------------------------------------------------------- Multi Wound Chart Details Patient Name: Sharon [name], [name] Date of Service: 03/30/2018 9:45 AM Medical Record Number: [id number] Patient Account Number: [id number] Date of Birth/Sex: [birth year] (85 y.o. F) Treating RN: Montey Hora Primary Care Emillio Ngo: Emily Filbert Other Clinician: Referring Laakea Pereira: Emily Filbert Treating Kasean Denherder/Extender: Melburn Hake, HOYT Weeks in Treatment: 1 Vital Signs Height(in): 65 Pulse(bpm): 85 Weight(lbs): 155 Blood Pressure(mmHg): 192/47 Body Mass Index(BMI):  26 Temperature(F): 97.8 Respiratory Rate 16 (breaths/min): Photos: [1:No Photos] [2:No Photos] [3:No Photos] Wound Location: [1:Left Foot - Dorsal] [2:Left Toe Second - Dorsal] [3:Left Lower Leg - Anterior] Wounding Event: [1:Gradually Appeared] [2:Gradually Appeared] [3:Gradually Appeared] Primary Etiology: [1:Diabetic Wound/Ulcer of the Lower Extremity] [2:Diabetic Wound/Ulcer of the Lower Extremity] [3:Lymphedema] Secondary Etiology: [1:N/A] [2:N/A] [3:N/A] Comorbid History: [1:Cataracts, Anemia, Arrhythmia, Congestive Heart Failure, Hypertension, Type II Diabetes, Rheumatoid Arthritis, Osteoarthritis, Neuropathy, Anorexia/bulimia, Confinement Anxiety] [2:Cataracts, Anemia, Arrhythmia, Congestive Heart  Failure, Hypertension, Type II Diabetes, Rheumatoid Arthritis, Osteoarthritis, Neuropathy, Anorexia/bulimia, Confinement Anxiety] [3:Cataracts, Anemia, Arrhythmia, Congestive Heart Failure, Hypertension, Type II Diabetes, Rheumatoid Arthritis,  Osteoarthritis, Neuropathy, Anorexia/bulimia, Confinement Anxiety] Date Acquired: [1:03/09/2018] [2:03/09/2018] [3:03/09/2018] Weeks of Treatment: [1:1] [2:1] [3:1] Wound Status: [1:Open] [2:Open] [3:Open] Clustered Wound: [1:Yes] [2:No] [3:Yes] Clustered Quantity: [1:N/A] [2:N/A] [3:2] Measurements L x W x D [1:7.5x4.2x0.1] [2:1x1x0.1] [3:15x8x0.1] (cm) Area (cm) : [1:24.74] [2:0.785] [3:94.248] Volume (cm) : [1:2.474] [2:0.079] [3:9.425] % Reduction in Area: [1:12.50%] [2:-256.80%] [3:11.10%] % Reduction in Volume: [1:12.50%] [2:-259.10%] [3:11.10%] Classification: [1:Grade 2] [2:Unable to visualize wound bed Full Thickness Without] [3:Exposed Support Structures] Exudate Amount: [1:Large] [2:None Present] [3:Large] Exudate Type: [1:Serous] [2:N/A] [3:Serous] Exudate Color: [1:amber] [2:N/A] [3:amber] Wound Margin: [1:Indistinct, nonvisible] [2:Flat and Intact] [3:Indistinct, nonvisible] Granulation Amount: [1:Small (1-33%)] [2:Large  (67-100%)] [3:Medium (34-66%)] Granulation Quality: [1:Pink] [2:Red] [3:Pink] Necrotic Amount: [1:Large (67-100%)] [2:Small (1-33%)] [3:Small (1-33%)] Necrotic Tissue: [1:Adherent Slough] [2:Eschar] [3:Adherent Slough] Exposed Structures: [1:Fat Layer (Subcutaneous Tissue) Exposed: Yes Fascia: No] [2:Fascia: No Fat Layer (Subcutaneous Tissue) Exposed: No] [3:Fat Layer (Subcutaneous Tissue) Exposed: Yes Fascia: No] Tendon: No Tendon: No Tendon: No Muscle: No Muscle: No Muscle: No Joint: No Joint: No Joint: No Bone: No Bone: No Bone: No Epithelialization: Large (67-100%) None None Periwound Skin Texture: Induration: Yes Excoriation: No Excoriation: Yes Excoriation: No Induration: No Induration: No Callus: No Callus: No Callus: No Crepitus: No Crepitus: No Crepitus: No Rash: No Rash: No Rash: No Scarring: No Scarring: No Scarring: No Periwound Skin Moisture: Maceration: Yes Maceration: No Maceration: Yes Dry/Scaly: No Dry/Scaly: No Dry/Scaly: No Periwound Skin Color: Rubor: Yes Rubor: Yes Rubor: Yes Atrophie Blanche: No Atrophie Blanche: No Atrophie Blanche: No Cyanosis: No Cyanosis: No Cyanosis: No Ecchymosis: No Ecchymosis: No Ecchymosis: No Erythema: No Erythema: No Erythema: No Hemosiderin Staining: No Hemosiderin Staining: No Hemosiderin Staining: No Mottled: No Mottled: No Mottled: No Pallor: No Pallor: No Pallor: No Temperature: No Abnormality No Abnormality N/A Tenderness on Palpation: Yes Yes No Wound Preparation: Ulcer Cleansing: Ulcer Cleansing: Ulcer Cleansing: Rinsed/Irrigated with Saline Rinsed/Irrigated with Saline Rinsed/Irrigated with Saline Topical Anesthetic Applied: Topical Anesthetic Applied: Topical Anesthetic Applied: Other: lidocaine 4% None, Other: lidocaine 4% Other: lidocaine 4% Wound Number: [id number]  Photos: No Photos No Photos No Photos Wound Location: Right Lower Leg - Anterior Right Lower Leg - Lateral Left  Calcaneus Wounding Event: Gradually Appeared Gradually Appeared Gradually Appeared Primary Etiology: Lymphedema Lymphedema Pressure Ulcer Secondary Etiology: N/A N/A N/A Comorbid History: Cataracts, Anemia, Cataracts, Anemia, Cataracts, Anemia, Arrhythmia, Congestive Heart Arrhythmia, Congestive Heart Arrhythmia, Congestive Heart Failure, Hypertension, Type II Failure, Hypertension, Type II Failure, Hypertension, Type II Diabetes, Rheumatoid Diabetes, Rheumatoid Diabetes, Rheumatoid Arthritis, Osteoarthritis, Arthritis, Osteoarthritis, Arthritis, Osteoarthritis, Neuropathy, Anorexia/bulimia, Neuropathy, Anorexia/bulimia, Neuropathy, Anorexia/bulimia, Confinement Anxiety Confinement Anxiety Confinement Anxiety Date Acquired: 03/09/2018 03/09/2018 03/08/2018 Weeks of Treatment: 1 1 1  Wound Status: Open Open Open Clustered Wound: No Yes No Clustered Quantity: N/A N/A N/A Measurements L x W x D 9x7.5x0.1 4.3x3x0.1 1.6x2x0.2 (cm) Area (cm) : 53.014 10.132 2.513 Volume (cm) : 5.301 1.013 0.503 % Reduction in Area: -32.40% 74.20% -142.30% % Reduction in Volume: -32.30% 87.10% -143.00% Classification: Partial Thickness Partial Thickness Category/Stage II Exudate Amount: Large Large Large Exudate Type: Serous Serous Serous Exudate Color: amber amber amber Wound Margin: Indistinct, nonvisible Indistinct, nonvisible Flat and Intact Sharon [name], [name] ([id number]) Granulation Amount: Small (1-33%) Small (1-33%) Small (1-33%) Granulation Quality: Pink Pink Pink Necrotic Amount: Medium (34-66%) Medium (34-66%) Large (67-100%) Necrotic Tissue: Adherent [place] Exposed Structures: Fascia: No Fascia: No Fat Layer (Subcutaneous Fat Layer (Subcutaneous Fat Layer (Subcutaneous Tissue) Exposed: Yes Tissue) Exposed: No Tissue) Exposed: No Fascia:  No Tendon: No Tendon: No Tendon: No Muscle: No Muscle: No Muscle: No Joint: No Joint: No Joint: No Bone:  No Bone: No Bone: No Limited to Skin Breakdown Epithelialization: Large (67-100%) Large (67-100%) None Periwound Skin Texture: Excoriation: No Excoriation: No Excoriation: No Induration: No Induration: No Induration: No Callus: No Callus: No Callus: No Crepitus: No Crepitus: No Crepitus: No Rash: No Rash: No Rash: No Scarring: No Scarring: No Scarring: No Periwound Skin Moisture: Maceration: Yes Maceration: Yes Maceration: Yes Dry/Scaly: No Dry/Scaly: No Dry/Scaly: No Periwound Skin Color: Rubor: Yes Rubor: Yes Atrophie Blanche: No Atrophie Blanche: No Atrophie Blanche: No Cyanosis: No Cyanosis: No Cyanosis: No Ecchymosis: No Ecchymosis: No Ecchymosis: No Erythema: No Erythema: No Erythema: No Hemosiderin Staining: No Hemosiderin Staining: No Hemosiderin Staining: No Mottled: No Mottled: No Mottled: No Pallor: No Pallor: No Pallor: No Rubor: No Temperature: No Abnormality No Abnormality N/A Tenderness on Palpation: Yes Yes No Wound Preparation: Ulcer Cleansing: Ulcer Cleansing: Ulcer Cleansing: Rinsed/Irrigated with Saline Rinsed/Irrigated with Saline Rinsed/Irrigated with Saline Topical Anesthetic Applied: Topical Anesthetic Applied: Topical Anesthetic Applied: Other: lidocaine 4% Other: lidocaine 4% Other: lidocaine 4% Wound Number: 7 N/A N/A Photos: No Photos N/A N/A Wound Location: Right Calcaneus N/A N/A Wounding Event: Gradually Appeared N/A N/A Primary Etiology: Diabetic Wound/Ulcer of the N/A N/A Lower Extremity Secondary Etiology: Pressure Ulcer N/A N/A Comorbid History: Cataracts, Anemia, N/A N/A Arrhythmia, Congestive Heart Failure, Hypertension, Type II Diabetes, Rheumatoid Arthritis, Osteoarthritis, Neuropathy, Anorexia/bulimia, Confinement Anxiety Date Acquired: 03/09/2018 N/A N/A Weeks of Treatment: 1 N/A N/A Wound Status: Open N/A N/A Clustered Wound: No N/A N/A Clustered Quantity: N/A N/A N/A Measurements L x W x D  3.5x6x0.1 N/A N/A (cm) Sharon [name], [name] ([id number]) Area (cm) : 16.493 N/A N/A Volume (cm) : 1.649 N/A N/A % Reduction in Area: 0.00% N/A N/A % Reduction in Volume: 0.00% N/A N/A Classification: Unable to visualize wound bed N/A N/A Exudate Amount: None Present N/A N/A Exudate Type: N/A N/A N/A Exudate Color: N/A N/A N/A Wound Margin: Distinct, outline attached N/A N/A Granulation Amount: None Present (0%) N/A N/A Granulation Quality: N/A N/A N/A Necrotic Amount: Medium (34-66%) N/A N/A Necrotic Tissue: Eschar N/A N/A Exposed Structures: Fascia: No N/A N/A Fat Layer (Subcutaneous Tissue) Exposed: No Tendon: No Muscle: No Joint: No Bone: No Epithelialization: Large (67-100%) N/A N/A Periwound Skin Texture: Callus: Yes N/A N/A Excoriation: No Induration: No Crepitus: No Rash: No Scarring: No Periwound Skin Moisture: Maceration: No N/A N/A Dry/Scaly: No Periwound Skin Color: Atrophie Blanche: No N/A N/A Cyanosis: No Ecchymosis: No Erythema: No Hemosiderin Staining: No Mottled: No Pallor: No Rubor: No Temperature: No Abnormality N/A N/A Tenderness on Palpation: No N/A N/A Wound Preparation: Ulcer Cleansing: N/A N/A Rinsed/Irrigated with Saline Topical Anesthetic Applied: None Treatment Notes Electronic Signature(s) Signed: 03/30/2018 4:44:55 PM By: Montey Hora Entered By: Montey Hora on 03/30/2018 10:14:41 Gazzola, Sharon [name] ([id number]) -------------------------------------------------------------------------------- Multi-Disciplinary Care Plan Details Patient Name: Amodei, Octivia P. Date of Service: 03/30/2018 9:45 AM Medical Record Number: [id number] Patient Account Number: [id number] Date of Birth/Sex: 1933/06/16 (85 y.o. F) Treating RN: Montey Hora Primary Care Tequan Redmon: Emily Filbert Other Clinician: Referring Theadora Noyes: Emily Filbert Treating Anetha Slagel/Extender: Melburn Hake, HOYT Weeks in Treatment: 1 Active Inactive Abuse / Safety / Falls /  Self Care Management Nursing Diagnoses: Potential for falls Goals: Patient will remain injury free related to falls Date Initiated: 03/23/2018 Target Resolution Date: 06/19/2018 Goal Status: Active Interventions: Assess fall risk on admission and as needed Notes: Orientation to the Wound Care  Program Nursing Diagnoses: Knowledge deficit related to the wound healing center program Goals: Patient/caregiver will verbalize understanding of the [place] Date Initiated: 03/23/2018 Target Resolution Date: 06/19/2018 Goal Status: Active Interventions: Provide education on orientation to the wound center Notes: Venous Leg Ulcer Nursing Diagnoses: Actual venous Insuffiency (use after diagnosis is confirmed) Goals: Patient will maintain optimal edema control Date Initiated: 03/23/2018 Target Resolution Date: 06/19/2018 Goal Status: Active Interventions: Assess peripheral edema status every visit. IVER, FEHRENBACH ([id number]) Compression as ordered Notes: Wound/Skin Impairment Nursing Diagnoses: Impaired tissue integrity Goals: Ulcer/skin breakdown will heal within 14 weeks Date Initiated: 03/23/2018 Target Resolution Date: 06/19/2018 Goal Status: Active Interventions: Assess patient/caregiver ability to obtain necessary supplies Assess patient/caregiver ability to perform ulcer/skin care regimen upon admission and as needed Assess ulceration(s) every visit Notes: Electronic Signature(s) Signed: 03/30/2018 4:44:55 PM By: Montey Hora Entered By: Montey Hora on 03/30/2018 10:14:34 Sharon [name], [name] ([id number]) -------------------------------------------------------------------------------- Pain Assessment Details Patient Name: Sharon [name], [name] Date of Service: 03/30/2018 9:45 AM Medical Record Number: [id number] Patient Account Number: [id number] Date of Birth/Sex: Aug 18, 1933 (85 y.o. F) Treating RN: Army Melia Primary Care Jeraline Marcinek: Emily Filbert Other  Clinician: Referring Tiffanyann Deroo: Emily Filbert Treating Clemencia Helzer/Extender: Melburn Hake, HOYT Weeks in Treatment: 1 Active Problems Location of Pain Severity and Description of Pain Patient Has Paino No Site Locations Pain Management and Medication Current Pain Management: Electronic Signature(s) Signed: 03/30/2018 3:57:00 PM By: Army Melia Entered By: Army Melia on 03/30/2018 09:45:32 Sharon [name], Sharon [name] ([id number]) -------------------------------------------------------------------------------- Patient/Caregiver Education Details Patient Name: Derryberry, Vergia P. Date of Service: 03/30/2018 9:45 AM Medical Record Number: [id number] Patient Account Number: [id number] Date of Birth/Gender: 02-28-1933 (85 y.o. F) Treating RN: Montey Hora Primary Care Physician: Emily Filbert Other Clinician: Referring Physician: Emily Filbert Treating Physician/Extender: Sharalyn Ink in Treatment: 1 Education Assessment Education Provided To: Patient and Caregiver Education Topics Provided Safety: Handouts: Other: need for larger shoes for ambulation Methods: Explain/Verbal Responses: State content correctly Electronic Signature(s) Signed: 03/30/2018 4:44:55 PM By: Montey Hora Entered By: Montey Hora on 03/30/2018 10:24:35 Sharon [name], [name] ([id number]) -------------------------------------------------------------------------------- Wound Assessment Details Patient Name: Erby, Sharhonda P. Date of Service: 03/30/2018 9:45 AM Medical Record Number: [id number] Patient Account Number: [id number] Date of Birth/Sex: 1933-04-08 (85 y.o. F) Treating RN: Army Melia Primary Care Yoshino Broccoli: Emily Filbert Other Clinician: Referring Brown Dunlap: Emily Filbert Treating Bria Portales/Extender: Melburn Hake, HOYT Weeks in Treatment: 1 Wound Status Wound Number: 1 Primary Diabetic Wound/Ulcer of the Lower Extremity Etiology: Wound Location: Left Foot - Dorsal Wound Open Wounding Event: Gradually  Appeared Status: Date Acquired: 03/09/2018 Comorbid Cataracts, Anemia, Arrhythmia, Congestive Heart Weeks Of Treatment: 1 History: Failure, Hypertension, Type II Diabetes, Clustered Wound: Yes Rheumatoid Arthritis, Osteoarthritis, Neuropathy, Anorexia/bulimia, Confinement Anxiety Photos Photo Uploaded By: Army Melia on 03/30/2018 10:38:08 Wound Measurements Length: (cm) 7.5 Width: (cm) 4.2 Depth: (cm) 0.1 Area: (cm) 24.74 Volume: (cm) 2.474 % Reduction in Area: 12.5% % Reduction in Volume: 12.5% Epithelialization: Large (67-100%) Tunneling: No Undermining: No Wound Description Classification: Grade 2 Foul Odo Wound Margin: Indistinct, nonvisible Slough/F Exudate Amount: Large Exudate Type: Serous Exudate Color: amber r After Cleansing: No ibrino Yes Wound Bed Granulation Amount: Small (1-33%) Exposed Structure Granulation Quality: Pink Fascia Exposed: No Necrotic Amount: Large (67-100%) Fat Layer (Subcutaneous Tissue) Exposed: Yes Necrotic Quality: Adherent Slough Tendon Exposed: No Muscle Exposed: No Joint Exposed: No Bone Exposed: No Nielson, Permelia P. ([id number]) Periwound Skin Texture Texture Color No Abnormalities Noted: No No Abnormalities Noted: No Callus: No Atrophie Blanche: No Crepitus:  No Cyanosis: No Excoriation: No Ecchymosis: No Induration: Yes Erythema: No Rash: No Hemosiderin Staining: No Scarring: No Mottled: No Pallor: No Moisture Rubor: Yes No Abnormalities Noted: No Dry / Scaly: No Temperature / Pain Maceration: Yes Temperature: No Abnormality Tenderness on Palpation: Yes Wound Preparation Ulcer Cleansing: Rinsed/Irrigated with Saline Topical Anesthetic Applied: Other: lidocaine 4%, Treatment Notes Wound #1 (Left, Dorsal Foot) Notes silvercel, abd, 4 layer wrap bilateral; coveret to toes Electronic Signature(s) Signed: 03/30/2018 3:57:00 PM By: Army Melia Entered By: Army Melia on 03/30/2018 10:01:35 Sharon [name],  Sharon [name] ([id number]) -------------------------------------------------------------------------------- Wound Assessment Details Patient Name: Ewer, Icelyn P. Date of Service: 03/30/2018 9:45 AM Medical Record Number: [id number] Patient Account Number: [id number] Date of Birth/Sex: March 05, 1933 (85 y.o. F) Treating RN: Army Melia Primary Care Kerington Hildebrant: Emily Filbert Other Clinician: Referring Antonela Freiman: Emily Filbert Treating Joeli Fenner/Extender: Melburn Hake, HOYT Weeks in Treatment: 1 Wound Status Wound Number: 2 Primary Diabetic Wound/Ulcer of the Lower Extremity Etiology: Wound Location: Left Toe Second - Dorsal Wound Open Wounding Event: Gradually Appeared Status: Date Acquired: 03/09/2018 Comorbid Cataracts, Anemia, Arrhythmia, Congestive Heart Weeks Of Treatment: 1 History: Failure, Hypertension, Type II Diabetes, Clustered Wound: No Rheumatoid Arthritis, Osteoarthritis, Neuropathy, Anorexia/bulimia, Confinement Anxiety Photos Photo Uploaded By: Army Melia on 03/30/2018 10:38:08 Wound Measurements Length: (cm) 1 Width: (cm) 1 Depth: (cm) 0.1 Area: (cm) 0.785 Volume: (cm) 0.079 % Reduction in Area: -256.8% % Reduction in Volume: -259.1% Epithelialization: None Tunneling: No Undermining: No Wound Description Classification: Unable to visualize wound bed Foul Odo Wound Margin: Flat and Intact Slough/F Exudate Amount: None Present r After Cleansing: No ibrino No Wound Bed Granulation Amount: Large (67-100%) Exposed Structure Granulation Quality: Red Fascia Exposed: No Necrotic Amount: Small (1-33%) Fat Layer (Subcutaneous Tissue) Exposed: No Necrotic Quality: Eschar Tendon Exposed: No Muscle Exposed: No Joint Exposed: No Bone Exposed: No Periwound Skin Texture Texture Color Sharon [name], Sharon [name] P. ([id number]) No Abnormalities Noted: No No Abnormalities Noted: No Callus: No Atrophie Blanche: No Crepitus: No Cyanosis: No Excoriation: No Ecchymosis:  No Induration: No Erythema: No Rash: No Hemosiderin Staining: No Scarring: No Mottled: No Pallor: No Moisture Rubor: Yes No Abnormalities Noted: No Dry / Scaly: No Temperature / Pain Maceration: No Temperature: No Abnormality Tenderness on Palpation: Yes Wound Preparation Ulcer Cleansing: Rinsed/Irrigated with Saline Topical Anesthetic Applied: None, Other: lidocaine 4%, Treatment Notes Wound #2 (Left, Dorsal Toe Second) Notes silvercel, abd, 4 layer wrap bilateral; coveret to toes Electronic Signature(s) Signed: 03/30/2018 3:57:00 PM By: Army Melia Entered By: Army Melia on 03/30/2018 10:02:05 Melendrez, Dannya P. ([id number]) -------------------------------------------------------------------------------- Wound Assessment Details Patient Name: Noa, Korrina P. Date of Service: 03/30/2018 9:45 AM Medical Record Number: [id number] Patient Account Number: [id number] Date of Birth/Sex: [birth year] (85 y.o. F) Treating RN: Army Melia Primary Care Kadian Barcellos: Emily Filbert Other Clinician: Referring Wiley Flicker: Emily Filbert Treating Meaghen Vecchiarelli/Extender: Melburn Hake, HOYT Weeks in Treatment: 1 Wound Status Wound Number: 3 Primary Lymphedema Etiology: Wound Location: Left Lower Leg - Anterior Wound Open Wounding Event: Gradually Appeared Status: Date Acquired: 03/09/2018 Comorbid Cataracts, Anemia, Arrhythmia, Congestive Heart Weeks Of Treatment: 1 History: Failure, Hypertension, Type II Diabetes, Clustered Wound: Yes Rheumatoid Arthritis, Osteoarthritis, Neuropathy, Anorexia/bulimia, Confinement Anxiety Photos Photo Uploaded By: Army Melia on 03/30/2018 10:40:59 Wound Measurements Length: (cm) 15 Width: (cm) 8 Depth: (cm) 0.1 Clustered Quantity: 2 Area: (cm) 94.248 Volume: (cm) 9.425 % Reduction in Area: 11.1% % Reduction in Volume: 11.1% Epithelialization: None Tunneling: No Undermining: No Wound Description Full Thickness Without Exposed Support Foul  Odo Classification: Structures Slough/F Wound Margin: Indistinct,  nonvisible Exudate Large Amount: Exudate Type: Serous Exudate Color: amber r After Cleansing: No ibrino Yes Wound Bed Granulation Amount: Medium (34-66%) Exposed Structure Granulation Quality: Pink Fascia Exposed: No Necrotic Amount: Small (1-33%) Fat Layer (Subcutaneous Tissue) Exposed: Yes Necrotic Quality: Adherent Slough Tendon Exposed: No Muscle Exposed: No Coppola, Tavaria P. ([id number]) Joint Exposed: No Bone Exposed: No Periwound Skin Texture Texture Color No Abnormalities Noted: No No Abnormalities Noted: No Callus: No Atrophie Blanche: No Crepitus: No Cyanosis: No Excoriation: Yes Ecchymosis: No Induration: No Erythema: No Rash: No Hemosiderin Staining: No Scarring: No Mottled: No Pallor: No Moisture Rubor: Yes No Abnormalities Noted: No Dry / Scaly: No Maceration: Yes Wound Preparation Ulcer Cleansing: Rinsed/Irrigated with Saline Topical Anesthetic Applied: Other: lidocaine 4%, Treatment Notes Wound #3 (Left, Anterior Lower Leg) Notes silvercel, abd, 4 layer wrap bilateral; coveret to toes Electronic Signature(s) Signed: 03/30/2018 3:57:00 PM By: Army Melia Entered By: Army Melia on 03/30/2018 10:02:26 Giuliani, Amylah P. ([id number]) -------------------------------------------------------------------------------- Wound Assessment Details Patient Name: Caudle, Eria P. Date of Service: 03/30/2018 9:45 AM Medical Record Number: [id number] Patient Account Number: [id number] Date of Birth/Sex: [birth year] (85 y.o. F) Treating RN: Army Melia Primary Care Deamonte Sayegh: Emily Filbert Other Clinician: Referring Jaritza Duignan: Emily Filbert Treating Alaiyah Bollman/Extender: Melburn Hake, HOYT Weeks in Treatment: 1 Wound Status Wound Number: 4 Primary Lymphedema Etiology: Wound Location: Right Lower Leg - Anterior Wound Open Wounding Event: Gradually Appeared Status: Date Acquired:  03/09/2018 Comorbid Cataracts, Anemia, Arrhythmia, Congestive Heart Weeks Of Treatment: 1 History: Failure, Hypertension, Type II Diabetes, Clustered Wound: No Rheumatoid Arthritis, Osteoarthritis, Neuropathy, Anorexia/bulimia, Confinement Anxiety Photos Photo Uploaded By: Army Melia on 03/30/2018 10:40:36 Wound Measurements Length: (cm) 9 Width: (cm) 7.5 Depth: (cm) 0.1 Area: (cm) 53.014 Volume: (cm) 5.301 % Reduction in Area: -32.4% % Reduction in Volume: -32.3% Epithelialization: Large (67-100%) Tunneling: No Undermining: No Wound Description Classification: Partial Thickness Foul Odo Wound Margin: Indistinct, nonvisible Slough/F Exudate Amount: Large Exudate Type: Serous Exudate Color: amber r After Cleansing: No ibrino No Wound Bed Granulation Amount: Small (1-33%) Exposed Structure Granulation Quality: Pink Fascia Exposed: No Necrotic Amount: Medium (34-66%) Fat Layer (Subcutaneous Tissue) Exposed: No Necrotic Quality: Adherent Slough Tendon Exposed: No Muscle Exposed: No Joint Exposed: No Bone Exposed: No Limited to Skin Breakdown Peale, Tifanny P. ([id number]) Periwound Skin Texture Texture Color No Abnormalities Noted: No No Abnormalities Noted: No Callus: No Atrophie Blanche: No Crepitus: No Cyanosis: No Excoriation: No Ecchymosis: No Induration: No Erythema: No Rash: No Hemosiderin Staining: No Scarring: No Mottled: No Pallor: No Moisture Rubor: Yes No Abnormalities Noted: No Dry / Scaly: No Temperature / Pain Maceration: Yes Temperature: No Abnormality Tenderness on Palpation: Yes Wound Preparation Ulcer Cleansing: Rinsed/Irrigated with Saline Topical Anesthetic Applied: Other: lidocaine 4%, Treatment Notes Wound #4 (Right, Anterior Lower Leg) Notes silvercel, abd, 4 layer wrap bilateral; coveret to toes Electronic Signature(s) Signed: 03/30/2018 3:57:00 PM By: Army Melia Entered By: Army Melia on 03/30/2018  10:02:47 Boileau, Loris P. ([id number]) -------------------------------------------------------------------------------- Wound Assessment Details Patient Name: Jabbour, Jannatul P. Date of Service: 03/30/2018 9:45 AM Medical Record Number: [id number] Patient Account Number: [id number] Date of Birth/Sex: 12/10/1933 (85 y.o. F) Treating RN: Army Melia Primary Care Brittie Whisnant: Emily Filbert Other Clinician: Referring Iriana Artley: Emily Filbert Treating Lurlie Wigen/Extender: Melburn Hake, HOYT Weeks in Treatment: 1 Wound Status Wound Number: 5 Primary Lymphedema Etiology: Wound Location: Right Lower Leg - Lateral Wound Open Wounding Event: Gradually Appeared Status: Date Acquired: 03/09/2018 Comorbid Cataracts, Anemia, Arrhythmia, Congestive Heart Weeks Of Treatment: 1 History: Failure, Hypertension, Type II Diabetes,  Clustered Wound: Yes Rheumatoid Arthritis, Osteoarthritis, Neuropathy, Anorexia/bulimia, Confinement Anxiety Photos Photo Uploaded By: Army Melia on 03/30/2018 10:40:37 Wound Measurements Length: (cm) 4.3 Width: (cm) 3 Depth: (cm) 0.1 Area: (cm) 10.132 Volume: (cm) 1.013 % Reduction in Area: 74.2% % Reduction in Volume: 87.1% Epithelialization: Large (67-100%) Tunneling: No Undermining: No Wound Description Classification: Partial Thickness Foul Odo Wound Margin: Indistinct, nonvisible Slough/F Exudate Amount: Large Exudate Type: Serous Exudate Color: amber r After Cleansing: No ibrino No Wound Bed Granulation Amount: Small (1-33%) Exposed Structure Granulation Quality: Pink Fascia Exposed: No Necrotic Amount: Medium (34-66%) Fat Layer (Subcutaneous Tissue) Exposed: No Necrotic Quality: Adherent Slough Tendon Exposed: No Muscle Exposed: No Joint Exposed: No Bone Exposed: No Wahid, Shawntell P. ([id number]) Periwound Skin Texture Texture Color No Abnormalities Noted: No No Abnormalities Noted: No Callus: No Atrophie Blanche: No Crepitus: No Cyanosis:  No Excoriation: No Ecchymosis: No Induration: No Erythema: No Rash: No Hemosiderin Staining: No Scarring: No Mottled: No Pallor: No Moisture Rubor: Yes No Abnormalities Noted: No Dry / Scaly: No Temperature / Pain Maceration: Yes Temperature: No Abnormality Tenderness on Palpation: Yes Wound Preparation Ulcer Cleansing: Rinsed/Irrigated with Saline Topical Anesthetic Applied: Other: lidocaine 4%, Treatment Notes Wound #5 (Right, Lateral Lower Leg) Notes silvercel, abd, 4 layer wrap bilateral; coveret to toes Electronic Signature(s) Signed: 03/30/2018 3:57:00 PM By: Army Melia Entered By: Army Melia on 03/30/2018 10:03:07 Prather, Jenya P. ([id number]) -------------------------------------------------------------------------------- Wound Assessment Details Patient Name: Goecke, Areatha P. Date of Service: 03/30/2018 9:45 AM Medical Record Number: [id number] Patient Account Number: [id number] Date of Birth/Sex: [birth year] (85 y.o. F) Treating RN: Army Melia Primary Care Loukisha Gunnerson: Emily Filbert Other Clinician: Referring Leslie Jester: Emily Filbert Treating Remmington Urieta/Extender: Melburn Hake, HOYT Weeks in Treatment: 1 Wound Status Wound Number: 6 Primary Pressure Ulcer Etiology: Wound Location: Left Calcaneus Wound Open Wounding Event: Gradually Appeared Status: Date Acquired: 03/08/2018 Comorbid Cataracts, Anemia, Arrhythmia, Congestive Heart Weeks Of Treatment: 1 History: Failure, Hypertension, Type II Diabetes, Clustered Wound: No Rheumatoid Arthritis, Osteoarthritis, Neuropathy, Anorexia/bulimia, Confinement Anxiety Photos Photo Uploaded By: Army Melia on 03/30/2018 10:36:30 Wound Measurements Length: (cm) 1.6 Width: (cm) 2 Depth: (cm) 0.2 Area: (cm) 2.513 Volume: (cm) 0.503 % Reduction in Area: -142.3% % Reduction in Volume: -143% Epithelialization: None Tunneling: No Undermining: No Wound Description Classification: Category/Stage II Foul Odo Wound  Margin: Flat and Intact Slough/F Exudate Amount: Large Exudate Type: Serous Exudate Color: amber r After Cleansing: No ibrino Yes Wound Bed Granulation Amount: Small (1-33%) Exposed Structure Granulation Quality: Pink Fascia Exposed: No Necrotic Amount: Large (67-100%) Fat Layer (Subcutaneous Tissue) Exposed: Yes Necrotic Quality: Eschar, Adherent Slough Tendon Exposed: No Muscle Exposed: No Joint Exposed: No Bone Exposed: No Laban, Kadesha P. ([id number]) Periwound Skin Texture Texture Color No Abnormalities Noted: No No Abnormalities Noted: No Callus: No Atrophie Blanche: No Crepitus: No Cyanosis: No Excoriation: No Ecchymosis: No Induration: No Erythema: No Rash: No Hemosiderin Staining: No Scarring: No Mottled: No Pallor: No Moisture Rubor: No No Abnormalities Noted: No Dry / Scaly: No Maceration: Yes Wound Preparation Ulcer Cleansing: Rinsed/Irrigated with Saline Topical Anesthetic Applied: Other: lidocaine 4%, Treatment Notes Wound #6 (Left Calcaneus) Notes silvercel, abd, 4 layer wrap bilateral; coveret to toes Electronic Signature(s) Signed: 03/30/2018 3:57:00 PM By: Army Melia Entered By: Army Melia on 03/30/2018 10:03:28 Tejera, Theia P. ([id number]) -------------------------------------------------------------------------------- Wound Assessment Details Patient Name: Hiltner, Chrystie P. Date of Service: 03/30/2018 9:45 AM Medical Record Number: [id number] Patient Account Number: [id number] Date of Birth/Sex: 04/01/1933 (85 y.o. F) Treating RN: Army Melia Primary Care Khyle Goodell: Sabra Heck,  Mark Other Clinician: Referring Christ Fullenwider: Emily Filbert Treating Krizia Flight/Extender: Melburn Hake, HOYT Weeks in Treatment: 1 Wound Status Wound Number: 7 Primary Diabetic Wound/Ulcer of the Lower Extremity Etiology: Wound Location: Right Calcaneus Secondary Pressure Ulcer Wounding Event: Gradually Appeared Etiology: Date Acquired: 03/09/2018 Wound Open Weeks  Of Treatment: 1 Status: Clustered Wound: No Comorbid Cataracts, Anemia, Arrhythmia, Congestive History: Heart Failure, Hypertension, Type II Diabetes, Rheumatoid Arthritis, Osteoarthritis, Neuropathy, Anorexia/bulimia, Confinement Anxiety Photos Photo Uploaded By: Army Melia on 03/30/2018 10:36:31 Wound Measurements Length: (cm) 3.5 Width: (cm) 6 Depth: (cm) 0.1 Area: (cm) 16.493 Volume: (cm) 1.649 % Reduction in Area: 0% % Reduction in Volume: 0% Epithelialization: Large (67-100%) Tunneling: No Undermining: No Wound Description Classification: Unable to visualize wound bed Foul Odo Wound Margin: Distinct, outline attached Slough/F Exudate Amount: None Present r After Cleansing: No ibrino No Wound Bed Granulation Amount: None Present (0%) Exposed Structure Necrotic Amount: Medium (34-66%) Fascia Exposed: No Necrotic Quality: Eschar Fat Layer (Subcutaneous Tissue) Exposed: No Tendon Exposed: No Muscle Exposed: No Joint Exposed: No Bone Exposed: No Heagle, Averie P. ([id number]) Periwound Skin Texture Texture Color No Abnormalities Noted: No No Abnormalities Noted: No Callus: Yes Atrophie Blanche: No Crepitus: No Cyanosis: No Excoriation: No Ecchymosis: No Induration: No Erythema: No Rash: No Hemosiderin Staining: No Scarring: No Mottled: No Pallor: No Moisture Rubor: No No Abnormalities Noted: No Dry / Scaly: No Temperature / Pain Maceration: No Temperature: No Abnormality Wound Preparation Ulcer Cleansing: Rinsed/Irrigated with Saline Topical Anesthetic Applied: None Treatment Notes Wound #7 (Right Calcaneus) Notes silvercel, abd, 4 layer wrap bilateral; coveret to toes Electronic Signature(s) Signed: 03/30/2018 3:57:00 PM By: Army Melia Entered By: Army Melia on 03/30/2018 10:03:52 Roe, Britta P. ([id number]) -------------------------------------------------------------------------------- Vitals Details Patient Name: Schubring,  Ilhan P. Date of Service: 03/30/2018 9:45 AM Medical Record Number: [id number] Patient Account Number: [id number] Date of Birth/Sex: [birth year] (85 y.o. F) Treating RN: Army Melia Primary Care Roslyn Else: Emily Filbert Other Clinician: Referring Darlin Stenseth: Emily Filbert Treating Brie Eppard/Extender: Melburn Hake, HOYT Weeks in Treatment: 1 Vital Signs Time Taken: 09:44 Temperature (F): 97.8 Height (in): 65 Pulse (bpm): 65 Weight (lbs): 155 Respiratory Rate (breaths/min): 16 Body Mass Index (BMI): 25.8 Blood Pressure (mmHg): 192/47 Reference Range: 80 - 120 mg / dl Electronic Signature(s) Signed: 03/30/2018 3:57:00 PM By: Army Melia Entered By: Army Melia on 03/30/2018 09:45:47

## 2018-04-01 LAB — CULTURE, BLOOD (ROUTINE X 2)
Culture: NO GROWTH
Culture: NO GROWTH
Special Requests: ADEQUATE
Special Requests: ADEQUATE

## 2018-04-01 NOTE — Progress Notes (Signed)
NYLAN, NAKATANI ([id number]) Visit Report for 03/30/2018 Chief Complaint Document Details Patient Name: Sharon [name], [name] Date of Service: 03/30/2018 9:45 AM Medical Record Number: [id number] Patient Account Number: [id number] Date of Birth/Sex: 12/01/1933 (85 y.o. F) Treating RN: Montey Hora Primary Care Provider: Emily Filbert Other Clinician: Referring Provider: Emily Filbert Treating Provider/Extender: Melburn Hake, Kenlynn Houde Weeks in Treatment: 1 Information Obtained from: Patient Chief Complaint Multiple bilateral LE ulcers Electronic Signature(s) Signed: 03/30/2018 5:25:20 PM By: Worthy Keeler PA-C Entered By: Worthy Keeler on 03/30/2018 09:37:19 Tanney, Liliane P. ([id number]) -------------------------------------------------------------------------------- Debridement Details Patient Name: Sharon [name], [name] Date of Service: 03/30/2018 9:45 AM Medical Record Number: [id number] Patient Account Number: [id number] Date of Birth/Sex: 09-Oct-1933 (85 y.o. F) Treating RN: Montey Hora Primary Care Provider: Emily Filbert Other Clinician: Referring Provider: Emily Filbert Treating Provider/Extender: Melburn Hake, Sibley Rolison Weeks in Treatment: 1 Debridement Performed for Wound #6 Left Calcaneus Assessment: Performed By: Physician STONE III, Annarose Ouellet E., PA-C Debridement Type: Debridement Level of Consciousness (Pre- Awake and Alert procedure): Pre-procedure Verification/Time Yes - 10:20 Out Taken: Start Time: 10:20 Pain Control: Lidocaine 4% Topical Solution Total Area Debrided (L x W): 1.6 (cm) x 2 (cm) = 3.2 (cm) Tissue and other material Viable, Non-Viable, Slough, Subcutaneous, Slough debrided: Level: Skin/Subcutaneous Tissue Debridement Description: Excisional Instrument: Curette Bleeding: Minimum Hemostasis Achieved: Pressure End Time: 10:22 Procedural Pain: 0 Post Procedural Pain: 0 Response to Treatment: Procedure was tolerated well Level of Consciousness Awake and  Alert (Post-procedure): Post Debridement Measurements of Total Wound Length: (cm) 1.6 Stage: Category/Stage II Width: (cm) 2 Depth: (cm) 0.3 Volume: (cm) 0.754 Character of Wound/Ulcer Post Improved Debridement: Post Procedure Diagnosis Same as Pre-procedure Electronic Signature(s) Signed: 03/30/2018 4:44:55 PM By: Montey Hora Signed: 03/30/2018 5:25:20 PM By: Worthy Keeler PA-C Entered By: Montey Hora on 03/30/2018 10:22:03 Sharon [name], [name] ([id number]) -------------------------------------------------------------------------------- HPI Details Patient Name: Sharon [name], [name] Date of Service: 03/30/2018 9:45 AM Medical Record Number: [id number] Patient Account Number: [id number] Date of Birth/Sex: July 15, 1933 (85 y.o. F) Treating RN: Montey Hora Primary Care Provider: Emily Filbert Other Clinician: Referring Provider: Emily Filbert Treating Provider/Extender: Melburn Hake, Luara Faye Weeks in Treatment: 1 History of Present Illness HPI Description: 03/23/18 On evaluation today patient presents for initial evaluation our clinic concerning issues that she's been having with the bilateral lower extremities. She has been out of the hospital for about a week and was there for about four days due to fluid overload. She does have congestive heart failure, atrial fibrillation, diabetes mellitus type II, and a right first toe amputation. Currently she is having bilateral lower extremity weeping and swelling. During her time in the hospital that she took her off of her diuretic medications according to what her son is telling me today. With that being said her primary care provider, Dr. Sabra Heck, has placed her back on some of these medications according to what her son is telling me. She has undergone previous testing including hemoglobin A1c of 7.6 on 01/11/18. She's also had arterial studies which show that her right ABI was 0.89 and the left ABI of 0.94 this obviously is good as well.  Currently the biggest concern with both the patient and her son are issues with overall general fluid overload and the fact that just wrapping her legs or managing the legs as we would do here in the clinic will not treat the overall fluid overload issue which I completely agree with. With that being said I think that we definitely need to try to get  these areas closed as quickly as possible in order to prevent any chance for infection/cellulitis. 03/30/18 on evaluation today patient actually appears to be doing much better in regard to her lower extremity ulcers. She's been tolerating the dressing changes at this point without complication overall I do not see any signs of anything worsening which is good news. Nonetheless I am hopeful that she will continue to make good improvement she still has a lot of drainage we may want to increase her compression wrap to something a little bit stronger. Electronic Signature(s) Signed: 03/30/2018 5:25:20 PM By: Worthy Keeler PA-C Entered By: Worthy Keeler on 03/30/2018 17:22:07 Sharon [name], Sharon [name] ([id number]) -------------------------------------------------------------------------------- Physical Exam Details Patient Name: Sharon [name], [name] Date of Service: 03/30/2018 9:45 AM Medical Record Number: [id number] Patient Account Number: [id number] Date of Birth/Sex: 1933/09/26 (85 y.o. F) Treating RN: Montey Hora Primary Care Provider: Emily Filbert Other Clinician: Referring Provider: Emily Filbert Treating Provider/Extender: Melburn Hake, Benney [place] Weeks in Treatment: 1 Constitutional Well-nourished and well-hydrated in no acute distress. Respiratory normal breathing without difficulty. clear to auscultation bilaterally. Cardiovascular regular rate and rhythm with normal S1, S2. Psychiatric this patient is able to make decisions and demonstrates good insight into disease process. Alert and Oriented x 3. pleasant and cooperative. Notes Patient's wound  bed currently shows evidence of fairly good granulation at this time which is good news fortunately there does not appear to be any signs of infection also good news. She does have significant edema again she is on the fluid pill and I do believe that this is helping although again I think she may benefit from a stronger compression wrap. Electronic Signature(s) Signed: 03/30/2018 5:25:20 PM By: Worthy Keeler PA-C Entered By: Worthy Keeler on 03/30/2018 17:22:39 Sharon [name], Sharon [name] ([id number]) -------------------------------------------------------------------------------- Physician Orders Details Patient Name: Sharon [name], [name] Date of Service: 03/30/2018 9:45 AM Medical Record Number: [id number] Patient Account Number: [id number] Date of Birth/Sex: 13-Oct-1933 (85 y.o. F) Treating RN: Montey Hora Primary Care Provider: Emily Filbert Other Clinician: Referring Provider: Emily Filbert Treating Provider/Extender: Melburn Hake, Loran Auguste Weeks in Treatment: 1 Verbal / Phone Orders: No Diagnosis Coding ICD-10 Coding Code Description E11.621 Type 2 diabetes mellitus with foot ulcer I89.0 Lymphedema, not elsewhere classified L97.822 Non-pressure chronic ulcer of other part of left lower leg with fat layer exposed L97.812 Non-pressure chronic ulcer of other part of right lower leg with fat layer exposed L97.512 Non-pressure chronic ulcer of other part of right foot with fat layer exposed L97.522 Non-pressure chronic ulcer of other part of left foot with fat layer exposed I73.89 Other specified peripheral vascular diseases Wound Cleansing Wound #1 Left,Dorsal Foot o Cleanse wound with mild soap and water Wound #2 Left,Dorsal Toe Second o Cleanse wound with mild soap and water Wound #3 Left,Anterior Lower Leg o Cleanse wound with mild soap and water Wound #4 Right,Anterior Lower Leg o Cleanse wound with mild soap and water Wound #5 Right,Lateral Lower Leg o Cleanse wound with mild  soap and water Wound #6 Left Calcaneus o Cleanse wound with mild soap and water Wound #7 Right Calcaneus o Cleanse wound with mild soap and water Primary Wound Dressing Wound #1 Left,Dorsal Foot o Silver Alginate Wound #2 Left,Dorsal Toe Second o Silver Alginate Wound #3 Left,Anterior Lower Leg o Silver Alginate Wound #4 Right,Anterior Lower Leg Sharon [name], [name] ([id number]) o Silver Alginate Wound #5 Right,Lateral Lower Leg o Silver Alginate Wound #6 Left Calcaneus o Silver Alginate Wound #7 Right Calcaneus o Silver Alginate Secondary  Dressing Wound #1 Left,Dorsal Foot o ABD pad Wound #2 Left,Dorsal Toe Second o ABD pad Wound #3 Left,Anterior Lower Leg o ABD pad Wound #4 Right,Anterior Lower Leg o ABD pad Wound #5 Right,Lateral Lower Leg o ABD pad Wound #6 Left Calcaneus o ABD pad Wound #7 Right Calcaneus o ABD pad Dressing Change Frequency Wound #1 Left,Dorsal Foot o Three times weekly Wound #2 Left,Dorsal Toe Second o Three times weekly Wound #3 Left,Anterior Lower Leg o Three times weekly Wound #4 Right,Anterior Lower Leg o Three times weekly Wound #5 Right,Lateral Lower Leg o Three times weekly Wound #6 Left Calcaneus o Three times weekly Wound #7 Right Calcaneus o Three times weekly Follow-up Appointments Holzer, Hyacinth P. ([id number]) o Return Appointment in 1 week. Edema Control Wound #1 Left,Dorsal Foot o 3 Layer Compression System - Bilateral - Please wrap from toes to 3cm below the knee Wound #3 Left,Anterior Lower Leg o 3 Layer Compression System - Bilateral - Please wrap from toes to 3cm below the knee Wound #4 Right,Anterior Lower Leg o 3 Layer Compression System - Bilateral - Please wrap from toes to 3cm below the knee Wound #5 Right,Lateral Lower Leg o 3 Layer Compression System - Bilateral - Please wrap from toes to 3cm below the knee Wound #6 Left Calcaneus o 3 Layer  Compression System - Bilateral - Please wrap from toes to 3cm below the knee Wound #7 Right Calcaneus o 3 Layer Compression System - Bilateral - Please wrap from toes to 3cm below the knee [place] #1 [place] Visits - [place] Nurse may visit PRN to address patientos wound care needs. o FACE TO FACE ENCOUNTER: MEDICARE and MEDICAID PATIENTS: I certify that this patient is under my care and that I had a face-to-face encounter that meets the physician face-to-face encounter requirements with this patient on this date. The encounter with the patient was in whole or in part for the following MEDICAL CONDITION: (primary reason for [place]) MEDICAL NECESSITY: I certify, that based on my findings, NURSING services are a medically necessary home health service. HOME BOUND STATUS: I certify that my clinical findings support that this patient is homebound (i.e., Due to illness or injury, pt requires aid of supportive devices such as crutches, cane, wheelchairs, walkers, the use of special transportation or the assistance of another person to leave their place of residence. There is a normal inability to leave the home and doing so requires considerable and taxing effort. Other absences are for medical reasons / religious services and are infrequent or of short duration when for other reasons). o If current dressing causes regression in wound condition, may D/C ordered dressing product/s and apply Normal Saline Moist Dressing daily until next [place] / Other MD appointment. [place] of regression in wound condition at [phone]. o Please direct any NON-WOUND related issues/requests for orders to patient's Primary Care Physician Wound #2 [place] Visits - [place] Nurse may visit PRN to address patientos wound care needs. o FACE TO FACE  ENCOUNTER: MEDICARE and MEDICAID PATIENTS: I certify that this patient is under my care and that I had a face-to-face encounter that meets the physician face-to-face encounter requirements with this patient on this date. The encounter with the patient was in whole or in part for the following MEDICAL CONDITION: (primary reason for [place]) MEDICAL NECESSITY: I certify, that based on my findings,  NURSING services are a medically necessary home health service. HOME BOUND STATUS: I certify that my clinical findings support that this patient is homebound (i.e., Due to illness or injury, pt requires aid of supportive devices such as crutches, cane, wheelchairs, walkers, the use of special transportation or the assistance of another person to leave their place of residence. There is a normal inability to leave the home and doing so requires considerable and taxing effort. Other absences are for medical reasons / religious services and are infrequent or of short duration when for other reasons). o If current dressing causes regression in wound condition, may D/C ordered dressing product/s and apply Normal Saline Moist Dressing daily until next [place] / Other MD appointment. [place] of regression in wound condition at [phone]. o Please direct any NON-WOUND related issues/requests for orders to patient's Primary Care Physician ARIEON, SCALZO ([id number]) Wound #3 [place] Visits - [place] Nurse may visit PRN to address patientos wound care needs. o FACE TO FACE ENCOUNTER: MEDICARE and MEDICAID PATIENTS: I certify that this patient is under my care and that I had a face-to-face encounter that meets the physician face-to-face encounter requirements with this patient on this date. The encounter with the patient was in whole or in part for the following MEDICAL CONDITION: (primary reason for [place]) MEDICAL NECESSITY: I certify, that based on my findings, NURSING services are a medically necessary home health service. HOME BOUND STATUS: I certify that my clinical findings support that this patient is homebound (i.e., Due to illness or injury, pt requires aid of supportive devices such as crutches, cane, wheelchairs, walkers, the use of special transportation or the assistance of another person to leave their place of residence. There is a normal inability to leave the home and doing so requires considerable and taxing effort. Other absences are for medical reasons / religious services and are infrequent or of short duration when for other reasons). o If current dressing causes regression in wound condition, may D/C ordered dressing product/s and apply Normal Saline Moist Dressing daily until next [place] / Other MD appointment. [place] of regression in wound condition at [phone]. o Please direct any NON-WOUND related issues/requests for orders to patient's Primary Care Physician Wound #4 [place] Visits - [place] Nurse may visit PRN to address patientos wound care needs. o FACE TO FACE ENCOUNTER: MEDICARE and MEDICAID PATIENTS: I certify that this patient is under my care and that I had a face-to-face encounter that meets the physician face-to-face encounter requirements with this patient on this date. The encounter with the patient was in whole or in part for the following MEDICAL CONDITION: (primary reason for [place]) MEDICAL NECESSITY: I certify, that based on my findings, NURSING services are a medically necessary home health service. HOME BOUND STATUS: I certify that my clinical findings support that this patient is homebound (i.e., Due to illness or injury, pt requires aid of supportive devices such as crutches, cane, wheelchairs, walkers, the use of special  transportation or the assistance of another person to leave their place of residence. There is a normal inability to leave the home and doing so requires considerable and taxing effort. Other absences are for medical reasons / religious services and are infrequent or of short duration when for other reasons). o If current dressing causes regression in wound condition, may  D/C ordered dressing product/s and apply Normal Saline Moist Dressing daily until next [place] / Other MD appointment. [place] of regression in wound condition at [phone]. o Please direct any NON-WOUND related issues/requests for orders to patient's Primary Care Physician Wound #5 Right,Lateral Lower Leg o [place] Visits - [place] Nurse may visit PRN to address patientos wound care needs. o FACE TO FACE ENCOUNTER: MEDICARE and MEDICAID PATIENTS: I certify that this patient is under my care and that I had a face-to-face encounter that meets the physician face-to-face encounter requirements with this patient on this date. The encounter with the patient was in whole or in part for the following MEDICAL CONDITION: (primary reason for [place]) MEDICAL NECESSITY: I certify, that based on my findings, NURSING services are a medically necessary home health service. HOME BOUND STATUS: I certify that my clinical findings support that this patient is homebound (i.e., Due to illness or injury, pt requires aid of supportive devices such as crutches, cane, wheelchairs, walkers, the use of special transportation or the assistance of another person to leave their place of residence. There is a normal inability to leave the home and doing so requires considerable and taxing effort. Other absences are for medical reasons / religious services and are infrequent or of short duration when for other reasons). o If current dressing causes regression in wound  condition, may D/C ordered dressing product/s and apply Normal Saline Moist Dressing daily until next [place] / Other MD appointment. [place] of regression in wound condition at [phone]. o Please direct any NON-WOUND related issues/requests for orders to patient's Primary Care Physician Wound #6 Left Calcaneus o [place] [facility] - [place] Nurse may visit PRN to address patientos wound care needs. NYELLA, ECKELS ([id number]) o FACE TO FACE ENCOUNTER: MEDICARE and MEDICAID PATIENTS: I certify that this patient is under my care and that I had a face-to-face encounter that meets the physician face-to-face encounter requirements with this patient on this date. The encounter with the patient was in whole or in part for the following MEDICAL CONDITION: (primary reason for [place]) MEDICAL NECESSITY: I certify, that based on my findings, NURSING services are a medically necessary home health service. HOME BOUND STATUS: I certify that my clinical findings support that this patient is homebound (i.e., Due to illness or injury, pt requires aid of supportive devices such as crutches, cane, wheelchairs, walkers, the use of special transportation or the assistance of another person to leave their place of residence. There is a normal inability to leave the home and doing so requires considerable and taxing effort. Other absences are for medical reasons / religious services and are infrequent or of short duration when for other reasons). o If current dressing causes regression in wound condition, may D/C ordered dressing product/s and apply Normal Saline Moist Dressing daily until next [place] / Other MD appointment. [place] of regression in wound condition at [phone]. o Please direct any NON-WOUND related issues/requests for orders to patient's Primary Care Physician Wound #7 Right  Calcaneus o [place] [facility] - [place] Nurse may visit PRN to address patientos wound care needs. o FACE TO FACE ENCOUNTER: MEDICARE and MEDICAID PATIENTS: I certify that this patient is under my care and that I had a face-to-face encounter that meets the physician face-to-face encounter requirements with this patient on this date.  The encounter with the patient was in whole or in part for the following MEDICAL CONDITION: (primary reason for [place]) MEDICAL NECESSITY: I certify, that based on my findings, NURSING services are a medically necessary home health service. HOME BOUND STATUS: I certify that my clinical findings support that this patient is homebound (i.e., Due to illness or injury, pt requires aid of supportive devices such as crutches, cane, wheelchairs, walkers, the use of special transportation or the assistance of another person to leave their place of residence. There is a normal inability to leave the home and doing so requires considerable and taxing effort. Other absences are for medical reasons / religious services and are infrequent or of short duration when for other reasons). o If current dressing causes regression in wound condition, may D/C ordered dressing product/s and apply Normal Saline Moist Dressing daily until next [place] / Other MD appointment. [place] of regression in wound condition at [phone]. o Please direct any NON-WOUND related issues/requests for orders to patient's Primary Care Physician Electronic Signature(s) Signed: 03/30/2018 4:44:55 PM By: Montey Hora Signed: 03/30/2018 5:25:20 PM By: Worthy Keeler PA-C Entered By: Montey Hora on 03/30/2018 10:23:21 [place], Katieann P. ([id number]) -------------------------------------------------------------------------------- Problem List Details Patient Name: Krock, Elajah P. Date of Service: 03/30/2018 9:45 AM Medical  Record Number: [id number] Patient Account Number: [id number] Date of Birth/Sex: 03-12-1933 (85 y.o. F) Treating RN: Montey Hora Primary Care Provider: Emily Filbert Other Clinician: Referring Provider: Emily Filbert Treating Provider/Extender: Melburn Hake, Andrya Roppolo Weeks in Treatment: 1 Active Problems ICD-10 Evaluated Encounter Code Description Active Date Today Diagnosis E11.621 Type 2 diabetes mellitus with foot ulcer 03/23/2018 No Yes I89.0 Lymphedema, not elsewhere classified 03/23/2018 No Yes L97.822 Non-pressure chronic ulcer of other part of left lower leg with 03/23/2018 No Yes fat layer exposed L97.812 Non-pressure chronic ulcer of other part of right lower leg 03/23/2018 No Yes with fat layer exposed L97.512 Non-pressure chronic ulcer of other part of right foot with fat 03/23/2018 No Yes layer exposed L97.522 Non-pressure chronic ulcer of other part of left foot with fat 03/23/2018 No Yes layer exposed I73.89 Other specified peripheral vascular diseases 03/23/2018 No Yes Inactive Problems Resolved Problems Electronic Signature(s) Signed: 03/30/2018 5:25:20 PM By: Worthy Keeler PA-C Entered By: Worthy Keeler on 03/30/2018 09:37:14 Sharon [name], [name] ([id number]) Sharon [name], Sharon [name] ([id number]) -------------------------------------------------------------------------------- Progress Note Details Patient Name: Sharon [name], [name] Date of Service: 03/30/2018 9:45 AM Medical Record Number: [id number] Patient Account Number: [id number] Date of Birth/Sex: 1933-09-24 (85 y.o. F) Treating RN: Montey Hora Primary Care Provider: Emily Filbert Other Clinician: Referring Provider: Emily Filbert Treating Provider/Extender: Melburn Hake, Omir Cooprider Weeks in Treatment: 1 Subjective Chief Complaint Information obtained from Patient Multiple bilateral LE ulcers History of Present Illness (HPI) 03/23/18 On evaluation today patient presents for initial evaluation our clinic concerning issues that she's  been having with the bilateral lower extremities. She has been out of the hospital for about a week and was there for about four days due to fluid overload. She does have congestive heart failure, atrial fibrillation, diabetes mellitus type II, and a right first toe amputation. Currently she is having bilateral lower extremity weeping and swelling. During her time in the hospital that she took her off of her diuretic medications according to what her son is telling me today. With that being said her primary care provider, Dr. Sabra Heck, has placed her back on some of these medications according to what her son is telling me. She  has undergone previous testing including hemoglobin A1c of 7.6 on 01/11/18. She's also had arterial studies which show that her right ABI was 0.89 and the left ABI of 0.94 this obviously is good as well. Currently the biggest concern with both the patient and her son are issues with overall general fluid overload and the fact that just wrapping her legs or managing the legs as we would do here in the clinic will not treat the overall fluid overload issue which I completely agree with. With that being said I think that we definitely need to try to get these areas closed as quickly as possible in order to prevent any chance for infection/cellulitis. 03/30/18 on evaluation today patient actually appears to be doing much better in regard to her lower extremity ulcers. She's been tolerating the dressing changes at this point without complication overall I do not see any signs of anything worsening which is good news. Nonetheless I am hopeful that she will continue to make good improvement she still has a lot of drainage we may want to increase her compression wrap to something a little bit stronger. Patient History Information obtained from Patient. Family History Hypertension - Child,Mother, Stroke - Father, No family history of Cancer, Diabetes, Kidney Disease, Lung Disease,  Seizures, Thyroid Problems, Tuberculosis. Social History Former smoker - 30 years ago, Marital Status - Widowed, Alcohol Use - Never, Drug Use - No History, Caffeine Use - Rarely. Medical History Eyes Patient has history of Cataracts Denies history of Glaucoma, Optic Neuritis Ear/Nose/Mouth/Throat Denies history of Chronic sinus problems/congestion, Middle ear problems Hematologic/Lymphatic Patient has history of Anemia Denies history of Hemophilia, Human Immunodeficiency Virus, Lymphedema, Sickle Cell Disease Respiratory Denies history of Aspiration, Asthma, Chronic Obstructive Pulmonary Disease (COPD), Pneumothorax, Sleep Apnea, Tuberculosis Cardiovascular Seaberg, Corlis P. ([id number]) Patient has history of Arrhythmia - A. fib, Congestive Heart Failure, Hypertension Denies history of Angina, Hypotension, Myocardial Infarction, Peripheral Arterial Disease, Peripheral Venous Disease, Phlebitis, Vasculitis Gastrointestinal Denies history of Cirrhosis , Colitis, Crohn s, Hepatitis A, Hepatitis B, Hepatitis C Endocrine Patient has history of Type II Diabetes - oral medication Genitourinary Denies history of End Stage Renal Disease Immunological Denies history of Lupus Erythematosus, Raynaud s, Scleroderma Integumentary (Skin) Denies history of History of Burn, History of pressure wounds Musculoskeletal Patient has history of Rheumatoid Arthritis, Osteoarthritis Denies history of Gout, Osteomyelitis Neurologic Patient has history of Neuropathy Denies history of Dementia, Quadriplegia, Paraplegia, Seizure Disorder Oncologic Denies history of Received Chemotherapy, Received Radiation Psychiatric Patient has history of Anorexia/bulimia, Confinement Anxiety Hospitalization/Surgery History - 03/10/2018, [facility], Fluid retention. Review of Systems (ROS) Constitutional Symptoms (General Health) Denies complaints or symptoms of Fever, Chills. Respiratory The patient has no  complaints or symptoms. Cardiovascular Complains or has symptoms of LE edema. Psychiatric The patient has no complaints or symptoms. Objective Constitutional Well-nourished and well-hydrated in no acute distress. Vitals Time Taken: 9:44 AM, Height: 65 in, Weight: 155 lbs, BMI: 25.8, Temperature: 97.8 F, Pulse: 65 bpm, Respiratory Rate: 16 breaths/min, Blood Pressure: 192/47 mmHg. Respiratory normal breathing without difficulty. clear to auscultation bilaterally. Cardiovascular regular rate and rhythm with normal S1, S2. Kleinschmidt, Aishia P. ([id number]) Psychiatric this patient is able to make decisions and demonstrates good insight into disease process. Alert and Oriented x 3. pleasant and cooperative. General Notes: Patient's wound bed currently shows evidence of fairly good granulation at this time which is good news fortunately there does not appear to be any signs of infection also good news. She does have significant edema  again she is on the fluid pill and I do believe that this is helping although again I think she may benefit from a stronger compression wrap. Integumentary (Hair, Skin) Wound #1 status is Open. Original cause of wound was Gradually Appeared. The wound is located on the Left,Dorsal Foot. The wound measures 7.5cm length x 4.2cm width x 0.1cm depth; 24.74cm^2 area and 2.474cm^3 volume. There is Fat Layer (Subcutaneous Tissue) Exposed exposed. There is no tunneling or undermining noted. There is a large amount of serous drainage noted. The wound margin is indistinct and nonvisible. There is small (1-33%) pink granulation within the wound bed. There is a large (67-100%) amount of necrotic tissue within the wound bed including Adherent Slough. The periwound skin appearance exhibited: Induration, Maceration, Rubor. The periwound skin appearance did not exhibit: Callus, Crepitus, Excoriation, Rash, Scarring, Dry/Scaly, Atrophie Blanche, Cyanosis, Ecchymosis, Hemosiderin  Staining, Mottled, Pallor, Erythema. Periwound temperature was noted as No Abnormality. The periwound has tenderness on palpation. Wound #2 status is Open. Original cause of wound was Gradually Appeared. The wound is located on the Left,Dorsal Toe Second. The wound measures 1cm length x 1cm width x 0.1cm depth; 0.785cm^2 area and 0.079cm^3 volume. There is no tunneling or undermining noted. There is a none present amount of drainage noted. The wound margin is flat and intact. There is large (67-100%) red granulation within the wound bed. There is a small (1-33%) amount of necrotic tissue within the wound bed including Eschar. The periwound skin appearance exhibited: Rubor. The periwound skin appearance did not exhibit: Callus, Crepitus, Excoriation, Induration, Rash, Scarring, Dry/Scaly, Maceration, Atrophie Blanche, Cyanosis, Ecchymosis, Hemosiderin Staining, Mottled, Pallor, Erythema. Periwound temperature was noted as No Abnormality. The periwound has tenderness on palpation. Wound #3 status is Open. Original cause of wound was Gradually Appeared. The wound is located on the Left,Anterior Lower Leg. The wound measures 15cm length x 8cm width x 0.1cm depth; 94.248cm^2 area and 9.425cm^3 volume. There is Fat Layer (Subcutaneous Tissue) Exposed exposed. There is no tunneling or undermining noted. There is a large amount of serous drainage noted. The wound margin is indistinct and nonvisible. There is medium (34-66%) pink granulation within the wound bed. There is a small (1-33%) amount of necrotic tissue within the wound bed including Adherent Slough. The periwound skin appearance exhibited: Excoriation, Maceration, Rubor. The periwound skin appearance did not exhibit: Callus, Crepitus, Induration, Rash, Scarring, Dry/Scaly, Atrophie Blanche, Cyanosis, Ecchymosis, Hemosiderin Staining, Mottled, Pallor, Erythema. Wound #4 status is Open. Original cause of wound was Gradually Appeared. The wound is  located on the Right,Anterior Lower Leg. The wound measures 9cm length x 7.5cm width x 0.1cm depth; 53.014cm^2 area and 5.301cm^3 volume. The wound is limited to skin breakdown. There is no tunneling or undermining noted. There is a large amount of serous drainage noted. The wound margin is indistinct and nonvisible. There is small (1-33%) pink granulation within the wound bed. There is a medium (34-66%) amount of necrotic tissue within the wound bed including Adherent Slough. The periwound skin appearance exhibited: Maceration, Rubor. The periwound skin appearance did not exhibit: Callus, Crepitus, Excoriation, Induration, Rash, Scarring, Dry/Scaly, Atrophie Blanche, Cyanosis, Ecchymosis, Hemosiderin Staining, Mottled, Pallor, Erythema. Periwound temperature was noted as No Abnormality. The periwound has tenderness on palpation. Wound #5 status is Open. Original cause of wound was Gradually Appeared. The wound is located on the Right,Lateral Lower Leg. The wound measures 4.3cm length x 3cm width x 0.1cm depth; 10.132cm^2 area and 1.013cm^3 volume. There is no tunneling or undermining noted.  There is a large amount of serous drainage noted. The wound margin is indistinct and nonvisible. There is small (1-33%) pink granulation within the wound bed. There is a medium (34-66%) amount of necrotic tissue within the wound bed including Adherent Slough. The periwound skin appearance exhibited: Maceration, Rubor. The periwound skin appearance did not exhibit: Callus, Crepitus, Excoriation, Induration, Rash, Scarring, Dry/Scaly, Atrophie Blanche, Cyanosis, Ecchymosis, Hemosiderin Staining, Mottled, Pallor, Erythema. Periwound temperature was noted as No Abnormality. The periwound has tenderness on palpation. Wound #6 status is Open. Original cause of wound was Gradually Appeared. The wound is located on the Left Calcaneus. The wound measures 1.6cm length x 2cm width x 0.2cm depth; 2.513cm^2 area and  0.503cm^3 volume. There is Fat Layer (Subcutaneous Tissue) Exposed exposed. There is no tunneling or undermining noted. There is a large amount of serous drainage noted. The wound margin is flat and intact. There is small (1-33%) pink granulation within the wound bed. There is a large (67-100%) amount of necrotic tissue within the wound bed including Eschar and Adherent Slough. The periwound skin Sharon [name], [name] ([id number]) appearance exhibited: Maceration. The periwound skin appearance did not exhibit: Callus, Crepitus, Excoriation, Induration, Rash, Scarring, Dry/Scaly, Atrophie Blanche, Cyanosis, Ecchymosis, Hemosiderin Staining, Mottled, Pallor, Rubor, Erythema. Wound #7 status is Open. Original cause of wound was Gradually Appeared. The wound is located on the Right Calcaneus. The wound measures 3.5cm length x 6cm width x 0.1cm depth; 16.493cm^2 area and 1.649cm^3 volume. There is no tunneling or undermining noted. There is a none present amount of drainage noted. The wound margin is distinct with the outline attached to the wound base. There is no granulation within the wound bed. There is a medium (34-66%) amount of necrotic tissue within the wound bed including Eschar. The periwound skin appearance exhibited: Callus. The periwound skin appearance did not exhibit: Crepitus, Excoriation, Induration, Rash, Scarring, Dry/Scaly, Maceration, Atrophie Blanche, Cyanosis, Ecchymosis, Hemosiderin Staining, Mottled, Pallor, Rubor, Erythema. Periwound temperature was noted as No Abnormality. Assessment Active Problems ICD-10 Type 2 diabetes mellitus with foot ulcer Lymphedema, not elsewhere classified Non-pressure chronic ulcer of other part of left lower leg with fat layer exposed Non-pressure chronic ulcer of other part of right lower leg with fat layer exposed Non-pressure chronic ulcer of other part of right foot with fat layer exposed Non-pressure chronic ulcer of other part of left foot  with fat layer exposed Other specified peripheral vascular diseases Procedures Wound #6 Pre-procedure diagnosis of Wound #6 is a Pressure Ulcer located on the Left Calcaneus . There was a Excisional Skin/Subcutaneous Tissue Debridement with a total area of 3.2 sq cm performed by STONE III, Harmoney Sienkiewicz E., PA-C. With the following instrument(s): Curette to remove Viable and Non-Viable tissue/material. Material removed includes Subcutaneous Tissue and Slough and after achieving pain control using Lidocaine 4% Topical Solution. No specimens were taken. A time out was conducted at 10:20, prior to the start of the procedure. A Minimum amount of bleeding was controlled with Pressure. The procedure was tolerated well with a pain level of 0 throughout and a pain level of 0 following the procedure. Post Debridement Measurements: 1.6cm length x 2cm width x 0.3cm depth; 0.754cm^3 volume. Post debridement Stage noted as Category/Stage II. Character of Wound/Ulcer Post Debridement is improved. Post procedure Diagnosis Wound #6: Same as Pre-Procedure Plan Wound Cleansing: Wound #1 Left,Dorsal Foot: Cleanse wound with mild soap and water Wound #2 Left,Dorsal Toe Second: Pic[place]te, Jeronica P. ([id number]) Cleanse wound with mild soap and water Wound #3 Left,Anterior Lower  Leg: Cleanse wound with mild soap and water Wound #4 Right,Anterior Lower Leg: Cleanse wound with mild soap and water Wound #5 Right,Lateral Lower Leg: Cleanse wound with mild soap and water Wound #6 Left Calcaneus: Cleanse wound with mild soap and water Wound #7 Right Calcaneus: Cleanse wound with mild soap and water Primary Wound Dressing: Wound #1 Left,Dorsal Foot: Silver Alg[place] Wound #2 Left,Dorsal Toe Second: Silver Alginate Wound #3 Left,Anterior Lower Leg: Silver Alginate Wound #4 Right,Anterior Lower Leg: Silver Alginate Wound #5 Right,Lateral Lower Leg: Silver Alginate Wound #6 Left Calcaneus: Silver Alginate Wound #7  Right Calcaneus: Silver Alginate Secondary Dressing: Wound #1 Left,Dorsal Foot: ABD pad Wound #2 Left,Dorsal Toe Second: ABD pad Wound #3 Left,Anterior Lower Leg: ABD pad Wound #4 Right,Anterior Lower Leg: ABD pad Wound #5 Right,Lateral Lower Leg: ABD pad Wound #6 Left Calcaneus: ABD pad Wound #7 Right Calcaneus: ABD pad Dressing Change Frequency: Wound #1 Left,Dorsal Foot: Three times weekly Wound #2 Left,Dorsal Toe Second: Three times weekly Wound #3 Left,Anterior Lower Leg: Three times weekly Wound #4 Right,Anterior Lower Leg: Three times weekly Wound #5 Right,Lateral Lower Leg: Three times weekly Wound #6 Left Calcaneus: Three times weekly Wound #7 Right Calcaneus: Three times weekly Follow-up Appointments: Return Appointment in 1 week. Edema Control: Wound #1 Left,Dorsal Foot: Raybon, Americus P. ([id number]) 3 Layer Compression System - Bilateral - Please wrap from toes to 3cm below the knee Wound #3 Left,Anterior Lower Leg: 3 Layer Compression System - Bilateral - Please wrap from toes to 3cm below the knee Wound #4 Right,Anterior Lower Leg: 3 Layer Compression System - Bilateral - Please wrap from toes to 3cm below the knee Wound #5 Right,Lateral Lower Leg: 3 Layer Compression System - Bilateral - Please wrap from toes to 3cm below the knee Wound #6 Left Calcaneus: 3 Layer Compression System - Bilateral - Please wrap from toes to 3cm below the knee Wound #7 Right Calcaneus: 3 Layer Compression System - Bilateral - Please wrap from toes to 3cm below the knee Home Health: Wound #1 Left,Dorsal Foot: Continue Home Health Visits - [facility] Nurse may visit PRN to address patient s wound care needs. FACE TO FACE ENCOUNTER: MEDICARE and MEDICAID PATIENTS: I certify that this patient is under my care and that I had a face-to-face encounter that meets the physician face-to-face encounter requirements with this patient on this date. The encounter with the  patient was in whole or in part for the following MEDICAL CONDITION: (primary reason for [place]) MEDICAL NECESSITY: I certify, that based on my findings, NURSING services are a medically necessary home health service. HOME BOUND STATUS: I certify that my clinical findings support that this patient is homebound (i.e., Due to illness or injury, pt requires aid of supportive devices such as crutches, cane, wheelchairs, walkers, the use of special transportation or the assistance of another person to leave their place of residence. There is a normal inability to leave the home and doing so requires considerable and taxing effort. Other absences are for medical reasons / religious services and are infrequent or of short duration when for other reasons). If current dressing causes regression in wound condition, may D/C ordered dressing product/s and apply Normal Saline Moist Dressing daily until next [place] / Other MD appointment. [place] of regression in wound condition at [phone]. Please direct any NON-WOUND related issues/requests for orders to patient's Primary Care Physician Wound #2 Left,Dorsal Toe Second: [place] Visits - [facility] Nurse  may visit PRN to address patient s wound care needs. FACE TO FACE ENCOUNTER: MEDICARE and MEDICAID PATIENTS: I certify that this patient is under my care and that I had a face-to-face encounter that meets the physician face-to-face encounter requirements with this patient on this date. The encounter with the patient was in whole or in part for the following MEDICAL CONDITION: (primary reason for [place]) MEDICAL NECESSITY: I certify, that based on my findings, NURSING services are a medically necessary home health service. HOME BOUND STATUS: I certify that my clinical findings support that this patient is homebound (i.e., Due to illness or injury, pt requires aid of supportive  devices such as crutches, cane, wheelchairs, walkers, the use of special transportation or the assistance of another person to leave their place of residence. There is a normal inability to leave the home and doing so requires considerable and taxing effort. Other absences are for medical reasons / religious services and are infrequent or of short duration when for other reasons). If current dressing causes regression in wound condition, may D/C ordered dressing product/s and apply Normal Saline Moist Dressing daily until next [place] / Other MD appointment. [place] of regression in wound condition at [phone]. Please direct any NON-WOUND related issues/requests for orders to patient's Primary Care Physician Wound #3 Left,Anterior Lower Leg: [place] Visits - [facility] Nurse may visit PRN to address patient s wound care needs. FACE TO FACE ENCOUNTER: MEDICARE and MEDICAID PATIENTS: I certify that this patient is under my care and that I had a face-to-face encounter that meets the physician face-to-face encounter requirements with this patient on this date. The encounter with the patient was in whole or in part for the following MEDICAL CONDITION: (primary reason for [place]) MEDICAL NECESSITY: I certify, that based on my findings, NURSING services are a medically necessary home health service. HOME BOUND STATUS: I certify that my clinical findings support that this patient is homebound (i.e., Due to illness or injury, pt requires aid of supportive devices such as crutches, cane, wheelchairs, walkers, the use of special transportation or the assistance of another person to leave their place of residence. There is a normal inability to leave the home and doing so requires considerable and taxing effort. Other absences are for medical reasons / religious services and are infrequent or of short duration when for other reasons). If  current dressing causes regression in wound condition, may D/C ordered dressing product/s and apply Normal Saline Moist Dressing daily until next [place] / Other MD appointment. [place] of regression in wound condition at [phone]. Please direct any NON-WOUND related issues/requests for orders to patient's Primary Care Physician SAHITI, JOSWICK ([id number]) Wound #4 Right,Anterior Lower Leg: [place] Visits - [facility] Nurse may visit PRN to address patient s wound care needs. FACE TO FACE ENCOUNTER: MEDICARE and MEDICAID PATIENTS: I certify that this patient is under my care and that I had a face-to-face encounter that meets the physician face-to-face encounter requirements with this patient on this date. The encounter with the patient was in whole or in part for the following MEDICAL CONDITION: (primary reason for [place]) MEDICAL NECESSITY: I certify, that based on my findings, NURSING services are a medically necessary home health service. HOME BOUND STATUS: I certify that my clinical findings support that this patient is homebound (i.e., Due to illness or injury, pt requires aid of supportive devices such  as crutches, cane, wheelchairs, walkers, the use of special transportation or the assistance of another person to leave their place of residence. There is a normal inability to leave the home and doing so requires considerable and taxing effort. Other absences are for medical reasons / religious services and are infrequent or of short duration when for other reasons). If current dressing causes regression in wound condition, may D/C ordered dressing product/s and apply Normal Saline Moist Dressing daily until next [place] / Other MD appointment. [place] of regression in wound condition at [phone]. Please direct any NON-WOUND related issues/requests for orders to patient's Primary  Care Physician Wound #5 Right,Lateral Lower Leg: [place] Visits - [facility] Nurse may visit PRN to address patient s wound care needs. FACE TO FACE ENCOUNTER: MEDICARE and MEDICAID PATIENTS: I certify that this patient is under my care and that I had a face-to-face encounter that meets the physician face-to-face encounter requirements with this patient on this date. The encounter with the patient was in whole or in part for the following MEDICAL CONDITION: (primary reason for [place]) MEDICAL NECESSITY: I certify, that based on my findings, NURSING services are a medically necessary home health service. HOME BOUND STATUS: I certify that my clinical findings support that this patient is homebound (i.e., Due to illness or injury, pt requires aid of supportive devices such as crutches, cane, wheelchairs, walkers, the use of special transportation or the assistance of another person to leave their place of residence. There is a normal inability to leave the home and doing so requires considerable and taxing effort. Other absences are for medical reasons / religious services and are infrequent or of short duration when for other reasons). If current dressing causes regression in wound condition, may D/C ordered dressing product/s and apply Normal Saline Moist Dressing daily until next [place] / Other MD appointment. [place] of regression in wound condition at [phone]. Please direct any NON-WOUND related issues/requests for orders to patient's Primary Care Physician Wound #6 Left Calcaneus: [place] Visits - [facility] Nurse may visit PRN to address patient s wound care needs. FACE TO FACE ENCOUNTER: MEDICARE and MEDICAID PATIENTS: I certify that this patient is under my care and that I had a face-to-face encounter that meets the physician face-to-face encounter requirements with this patient on this date.  The encounter with the patient was in whole or in part for the following MEDICAL CONDITION: (primary reason for [place]) MEDICAL NECESSITY: I certify, that based on my findings, NURSING services are a medically necessary home health service. HOME BOUND STATUS: I certify that my clinical findings support that this patient is homebound (i.e., Due to illness or injury, pt requires aid of supportive devices such as crutches, cane, wheelchairs, walkers, the use of special transportation or the assistance of another person to leave their place of residence. There is a normal inability to leave the home and doing so requires considerable and taxing effort. Other absences are for medical reasons / religious services and are infrequent or of short duration when for other reasons). If current dressing causes regression in wound condition, may D/C ordered dressing product/s and apply Normal Saline Moist Dressing daily until next [place] / Other MD appointment. [place] of regression in wound condition at [phone]. Please direct any NON-WOUND related issues/requests for orders to patient's Primary Care Physician Wound #7 Right Calcaneus: [place]  Visits - [facility] Nurse may visit PRN to address patient s wound care needs. FACE TO FACE ENCOUNTER: MEDICARE and MEDICAID PATIENTS: I certify that this patient is under my care and that I had a face-to-face encounter that meets the physician face-to-face encounter requirements with this patient on this date. The encounter with the patient was in whole or in part for the following MEDICAL CONDITION: (primary reason for [place]) MEDICAL NECESSITY: I certify, that based on my findings, NURSING services are a medically necessary home health service. HOME BOUND STATUS: I certify that my clinical findings support that this patient is homebound (i.e., Due to illness or injury, pt requires aid of  supportive devices such as crutches, cane, wheelchairs, walkers, the use of special transportation or the assistance of another person to leave their place of residence. There is a normal inability to leave the home and doing so requires considerable and taxing effort. Other absences are for medical reasons / religious services and are infrequent or of short duration when for other reasons). Hanko, Zahira P. ([id number]) If current dressing causes regression in wound condition, may D/C ordered dressing product/s and apply Normal Saline Moist Dressing daily until next [place] / Other MD appointment. [place] of regression in wound condition at [phone]. Please direct any NON-WOUND related issues/requests for orders to patient's Primary Care Physician On a recommend that we continue with the above wound to measures for the next week and the patient is in agreement with that plan. If anything changes or worsens meantime shall contact the office and let me know otherwise will see were things stand at follow-up. Please see above for specific wound care orders. We will see patient for re-evaluation in 1 week(s) here in the clinic. If anything worsens or changes patient will contact our office for additional recommendations. Electronic Signature(s) Signed: 03/30/2018 5:25:20 PM By: Worthy Keeler PA-C Entered By: Worthy Keeler on 03/30/2018 17:22:55 Zollinger, Sharon [name] ([id number]) -------------------------------------------------------------------------------- ROS/PFSH Details Patient Name: Everard, Maleyah P. Date of Service: 03/30/2018 9:45 AM Medical Record Number: [id number] Patient Account Number: [id number] Date of Birth/Sex: 23-Dec-1933 (85 y.o. F) Treating RN: Montey Hora Primary Care Provider: Emily Filbert Other Clinician: Referring Provider: Emily Filbert Treating Provider/Extender: Melburn Hake, Aleisha Paone Weeks in Treatment: 1 Information Obtained  From Patient Wound History Do you currently have one or more open woundso Yes Approximately how long have you had your woundso 3 weeks Has your wound(s) ever healed and then re-openedo No Have you had any lab work done in the past montho Yes Who ordered the lab work [employer] Have you tested positive for an antibiotic resistant organism (MRSA, VRE)o Yes Date: 03/10/2018 Have you tested positive for osteomyelitis (bone infection)o No Have you had any tests for circulation on your legso Yes Who ordered the testo [facility] Where was the test doneo 01/13/2017 Constitutional Symptoms (General Health) Complaints and Symptoms: Negative for: Fever; Chills Cardiovascular Complaints and Symptoms: Positive for: LE edema Medical History: Positive for: Arrhythmia - A. fib; Congestive Heart Failure; Hypertension Negative for: Angina; Hypotension; Myocardial Infarction; Peripheral Arterial Disease; Peripheral Venous Disease; Phlebitis; Vasculitis Eyes Medical History: Positive for: Cataracts Negative for: Glaucoma; Optic Neuritis Ear/Nose/Mouth/Throat Medical History: Negative for: Chronic sinus problems/congestion; Middle ear problems Hematologic/Lymphatic Medical History: Positive for: Anemia Negative for: Hemophilia; Human Immunodeficiency Virus; Lymphedema; Sickle Cell Disease Respiratory Kleist, Lanaiya P. ([id number]) Complaints and Symptoms: No Complaints or Symptoms Medical History: Negative for: Aspiration; Asthma; Chronic Obstructive Pulmonary  Disease (COPD); Pneumothorax; Sleep Apnea; Tuberculosis Gastrointestinal Medical History: Negative for: Cirrhosis ; Colitis; Crohnos; Hepatitis A; Hepatitis B; Hepatitis C Endocrine Medical History: Positive for: Type II Diabetes - oral medication Time with diabetes: 15 years Treated with: Oral agents Blood sugar tested every day: No Genitourinary Medical History: Negative for: End Stage Renal  Disease Immunological Medical History: Negative for: Lupus Erythematosus; Raynaudos; Scleroderma Integumentary (Skin) Medical History: Negative for: History of Burn; History of pressure wounds Musculoskeletal Medical History: Positive for: Rheumatoid Arthritis; Osteoarthritis Negative for: Gout; Osteomyelitis Neurologic Medical History: Positive for: Neuropathy Negative for: Dementia; Quadriplegia; Paraplegia; Seizure Disorder Oncologic Medical History: Negative for: Received Chemotherapy; Received Radiation Psychiatric Complaints and Symptoms: No Complaints or Symptoms Medical History: VAUGHN, FRIEZE. ([id number]) Positive for: Anorexia/bulimia; Confinement Anxiety HBO Extended History Items Eyes: Cataracts Immunizations Pneumococcal Vaccine: Received Pneumococcal Vaccination: Yes Implantable Devices Yes Hospitalization / Surgery History Name of Hospital Purpose of Hospitalization/Surgery Date  regional Fluid retention 03/10/2018 Family and Social History Cancer: No; Diabetes: No; Hypertension: Yes - Child,Mother; Kidney Disease: No; Lung Disease: No; Seizures: No; Stroke: Yes - Father; Thyroid Problems: No; Tuberculosis: No; Former smoker - 30 years ago; Marital Status - Widowed; Alcohol Use: Never; Drug Use: No History; Caffeine Use: Rarely; Financial Concerns: No; Food, Clothing or Shelter Needs: No; Support System Lacking: No; Transportation Concerns: No; Advanced Directives: No; Patient does not want information on Advanced Directives; Living Will: Yes (Not Provided); Medical Power of Attorney: Yes - Marshal (Son) (Not Provided) Physician Affirmation I have reviewed and agree with the above information. Electronic Signature(s) Signed: 03/30/2018 5:25:20 PM By: Worthy Keeler PA-C Signed: 03/31/2018 4:54:53 PM By: Montey Hora Entered By: Worthy Keeler on 03/30/2018 17:22:26 Bruno, Oceane P.  ([id number]) -------------------------------------------------------------------------------- SuperBill Details Patient Name: Mcgath, Sanita P. Date of Service: 03/30/2018 Medical Record Number: [id number] Patient Account Number: [id number] Date of Birth/Sex: [birth year] (85 y.o. F) Treating RN: Montey Hora Primary Care Provider: Emily Filbert Other Clinician: Referring Provider: Emily Filbert Treating Provider/Extender: Melburn Hake, Norena Bratton Weeks in Treatment: 1 Diagnosis Coding ICD-10 Codes Code Description E11.621 Type 2 diabetes mellitus with foot ulcer I89.0 Lymphedema, not elsewhere classified L97.822 Non-pressure chronic ulcer of other part of left lower leg with fat layer exposed L97.812 Non-pressure chronic ulcer of other part of right lower leg with fat layer exposed L97.512 Non-pressure chronic ulcer of other part of right foot with fat layer exposed L97.522 Non-pressure chronic ulcer of other part of left foot with fat layer exposed I73.89 Other specified peripheral vascular diseases Facility Procedures CPT4 Code: 60737106 Description: 26948 - DEB SUBQ TISSUE 20 SQ CM/< ICD-10 Diagnosis Description L97.522 Non-pressure chronic ulcer of other part of left foot with fat Modifier: layer exposed Quantity: 1 Physician Procedures CPT4 Code: 5462703 Description: 11042 - WC PHYS SUBQ TISS 20 SQ CM ICD-10 Diagnosis Description L97.522 Non-pressure chronic ulcer of other part of left foot with fat Modifier: layer exposed Quantity: 1 Electronic Signature(s) Signed: 03/30/2018 5:25:20 PM By: Worthy Keeler PA-C Entered By: Worthy Keeler on 03/30/2018 17:24:49

## 2018-04-05 ENCOUNTER — Ambulatory Visit: Payer: PPO | Admitting: Physical Therapy

## 2018-04-05 ENCOUNTER — Telehealth (INDEPENDENT_AMBULATORY_CARE_PROVIDER_SITE_OTHER): Payer: Self-pay | Admitting: Nurse Practitioner

## 2018-04-05 ENCOUNTER — Encounter: Payer: PPO | Admitting: Occupational Therapy

## 2018-04-06 ENCOUNTER — Other Ambulatory Visit: Payer: Self-pay

## 2018-04-06 ENCOUNTER — Encounter: Payer: Self-pay | Admitting: Occupational Therapy

## 2018-04-06 ENCOUNTER — Ambulatory Visit (INDEPENDENT_AMBULATORY_CARE_PROVIDER_SITE_OTHER): Payer: PPO

## 2018-04-06 ENCOUNTER — Ambulatory Visit (INDEPENDENT_AMBULATORY_CARE_PROVIDER_SITE_OTHER): Payer: PPO | Admitting: Vascular Surgery

## 2018-04-06 ENCOUNTER — Encounter (INDEPENDENT_AMBULATORY_CARE_PROVIDER_SITE_OTHER): Payer: Self-pay | Admitting: Vascular Surgery

## 2018-04-06 VITALS — BP 181/76 | HR 59 | Resp 14 | Ht 61.0 in | Wt 155.0 lb

## 2018-04-06 DIAGNOSIS — I7025 Atherosclerosis of native arteries of other extremities with ulceration: Secondary | ICD-10-CM

## 2018-04-06 DIAGNOSIS — I129 Hypertensive chronic kidney disease with stage 1 through stage 4 chronic kidney disease, or unspecified chronic kidney disease: Secondary | ICD-10-CM | POA: Diagnosis not present

## 2018-04-06 DIAGNOSIS — I10 Essential (primary) hypertension: Secondary | ICD-10-CM

## 2018-04-06 DIAGNOSIS — R600 Localized edema: Secondary | ICD-10-CM | POA: Diagnosis not present

## 2018-04-06 DIAGNOSIS — Z87891 Personal history of nicotine dependence: Secondary | ICD-10-CM

## 2018-04-06 DIAGNOSIS — Z79899 Other long term (current) drug therapy: Secondary | ICD-10-CM | POA: Diagnosis not present

## 2018-04-06 DIAGNOSIS — N183 Chronic kidney disease, stage 3 unspecified: Secondary | ICD-10-CM

## 2018-04-06 DIAGNOSIS — I890 Lymphedema, not elsewhere classified: Secondary | ICD-10-CM

## 2018-04-06 DIAGNOSIS — E1142 Type 2 diabetes mellitus with diabetic polyneuropathy: Secondary | ICD-10-CM | POA: Diagnosis not present

## 2018-04-06 NOTE — Assessment & Plan Note (Signed)
ABIs today were 0.95 on the right and 0.89 on the left.  Although the waveforms were technically monophasic, they were quite strong.  These levels are stable from her previous study almost a year ago which is encouraging.  She should have adequate perfusion to heal the wounds.  At this point, I would recommend weekly Unna boot wraps for 3-4 more weeks and then we can reassess her legs to come out of Unna boots.  A lymphedema pump is pending at this point but the company has contacted the patient's son.  Hopefully we can get that arranged in the next couple of weeks as well.

## 2018-04-06 NOTE — Telephone Encounter (Signed)
I attempted to return a call to the son at the number provided. The phone rings then hangs up. I attempted a call twice. The patient has an appt in our office today for ABI and f/u, the lymph pump has been ordered and sent to [employer]. Sometimes it does take a little time before insurance is approved.

## 2018-04-06 NOTE — Assessment & Plan Note (Signed)
Can contribute to LE swelling.  Also a risk with any dye related procedures.

## 2018-04-06 NOTE — Assessment & Plan Note (Signed)
blood pressure control important in reducing the progression of atherosclerotic disease. On appropriate oral medications.  

## 2018-04-06 NOTE — Assessment & Plan Note (Signed)
blood glucose control important in reducing the progression of atherosclerotic disease. Also, involved in wound healing. On appropriate medications.  

## 2018-04-06 NOTE — Assessment & Plan Note (Signed)
Lymphedema pump is pending.  Swelling is still present despite weekly Unna boots although it has improved.  We will continue the Unna boots for now and try to get the lymphedema pump.  When she comes out of the Unna boots, she will need to wear compression stockings regularly.

## 2018-04-06 NOTE — Progress Notes (Signed)
MRN : [id number]  Sharon [name] is a 85 y.o. (02/16/1933) female who presents with chief complaint of  Chief Complaint  Patient presents with  . Follow-up  .  History of Present Illness: Patient returns today in follow up of her lower extremity swelling and ulceration.  The weekly Unna boots have improved her leg swelling but her ulcers at this point are fairly stable.  The ulcers have gotten a little bit better and there is no sign of infection.  Her legs are still painful to her and heavy.  A lymphedema pump is in process but she has not yet received this.  No fevers or chills or signs of systemic infection. ABIs today were 0.95 on the right and 0.89 on the left.  Although the waveforms were technically monophasic, they were quite strong.  These levels are stable from her previous study almost a year ago which is encouraging.   Current Outpatient Medications  Medication Sig Dispense Refill  . acetaminophen (TYLENOL) 500 MG tablet Take 1,000 mg by mouth at bedtime. For pain management    . acetaminophen (TYLENOL) 650 MG CR tablet Take 650 mg by mouth every 8 (eight) hours as needed. For discomfort    . Amino Acids-Protein Hydrolys (FEEDING SUPPLEMENT, PRO-STAT SUGAR FREE 64,) LIQD Take 30 mLs by mouth 2 (two) times daily between meals.    Marland Kitchen amiodarone (PACERONE) 200 MG tablet Take 1 tablet (200 mg total) by mouth daily. 30 tablet 2  . amitriptyline (ELAVIL) 10 MG tablet Take 10 mg by mouth daily.     Marland Kitchen atorvastatin (LIPITOR) 10 MG tablet Take 10 mg by mouth daily at 10 pm.     . cephALEXin (KEFLEX) 250 MG capsule Take 1 capsule (250 mg total) by mouth 3 (three) times daily. 21 capsule 0  . cholecalciferol (VITAMIN D) 1000 units tablet Take 1,000 Units by mouth daily.    . collagenase (SANTYL) ointment Apply topically daily. 15 g 0  . ferrous sulfate 325 (65 FE) MG tablet Take 325 mg by mouth daily.     . furosemide (LASIX) 20 MG tablet     . gabapentin (NEURONTIN) 100 MG capsule Take 2  capsules (200 mg total) by mouth 3 (three) times daily. 180 capsule 11  . glimepiride (AMARYL) 4 MG tablet Take 4 mg by mouth daily.     Marland Kitchen GLUCERNA (GLUCERNA) LIQD Take 237 mLs by mouth 2 (two) times daily between meals.    Marland Kitchen JANUVIA 50 MG tablet Take 50 mg by mouth daily. Take along with Amaryl    . levothyroxine (SYNTHROID, LEVOTHROID) 112 MCG tablet Take 112 mcg by mouth daily before breakfast. 30 minutes before breakfast    . metoprolol tartrate (LOPRESSOR) 25 MG tablet Take 1 tablet (25 mg total) by mouth 2 (two) times daily. 60 tablet 0  . mupirocin ointment (BACTROBAN) 2 % Place 1 application into the nose 2 (two) times daily.    . traMADol (ULTRAM) 50 MG tablet Take 1 tablet (50 mg total) by mouth every 6 (six) hours as needed for moderate pain. 20 tablet 0  . traZODone (DESYREL) 50 MG tablet Take 50 mg by mouth at bedtime. For anxiety    . vitamin C (ASCORBIC ACID) 500 MG tablet Take 500 mg by mouth daily.      No current facility-administered medications for this visit.    Facility-Administered Medications Ordered in Other Visits  Medication Dose Route Frequency Provider Last Rate Last Dose  . sodium  chloride flush (NS) 0.9 % injection 10 mL  10 mL Intracatheter PRN Karen Kitchens, NP        Past Medical History:  Diagnosis Date  . Anemia   . Atrial fibrillation ([place])   . Cardiac arrest ([place])   . Cataract   . CHF (congestive heart failure) ([place])   . Diabetes mellitus without complication ([place])   . Edema    feet/ankles occas  . Hip fracture ([place])   . Hyperlipidemia   . Hypertension   . Hypothyroid   . Neuropathy   . Osteomyelitis ([place])    left first metatarsal  . Stroke [facility])     Past Surgical History:  Procedure Laterality Date  . ACHILLES TENDON SURGERY Left 02/08/2015   Procedure: ACHILLES LENGTHENING/KIDNER;  Surgeon: Albertine Patricia, DPM;  Location: [facility] ORS;  Service: Podiatry;  Laterality: Left;  . APPENDECTOMY    . CARDIAC CATHETERIZATION  08/25/13  .  CATARACT EXTRACTION W/PHACO Left 07/20/2014   Procedure: CATARACT EXTRACTION PHACO AND INTRAOCULAR LENS PLACEMENT (IOC);  Surgeon: Leandrew Koyanagi, MD;  Location: [facility] ORS;  Service: Ophthalmology;  Laterality: Left;  [place]  1:18                 AP     23.6             CDE   9.69      lot #[id number]  . CHOLECYSTECTOMY    . CORONARY ANGIOPLASTY    . HALLUX VALGUS AKIN Left 02/08/2015   Procedure: HALLUX VALGUS AKIN/ KELLER;  Surgeon: Albertine Patricia, DPM;  Location: [facility] OR[facility];  Service: Podiatry;  Laterality: Left;  IVA with Local needs 1 hour for this case   . HEMIARTHROPLASTY HIP Right   . HEMIARTHROPLASTY HIP Left   . INCISION AND DRAINAGE Left 10/11/2014   Procedure: Removal of infected tibial sessmoid;  Surgeon: Albertine Patricia, DPM;  Location: [facility] OR[facility];  Service: Podiatry;  Laterality: Left;  . IRRIGATION AND DEBRIDEMENT FOOT Right 03/21/2017   Procedure: IRRIGATION AND DEBRIDEMENT FOOT right great toe amputation;  Surgeon: Sharlotte Alamo, DPM;  Location: [facility];  Service: Podiatry;  Laterality: Right;  . IRRIGATION AND DEBRIDEMENT FOOT Right 04/18/2017   Procedure: IRRIGATION AND DEBRIDEMENT FOOT and application wound vac;  Surgeon: Albertine Patricia, DPM;  Location: [facility];  Service: Podiatry;  Laterality: Right;  . PERIPHERAL VASCULAR BALLOON ANGIOPLASTY Right 04/17/2017   Procedure: PERIPHERAL VASCULAR BALLOON ANGIOPLASTY;  Surgeon: Katha Cabal, MD;  Location: [facility];  Service: Cardiovascular;  Laterality: Right;    Social History Social History   Tobacco Use  . Smoking status: Former Smoker    Packs/day: 0.50    Years: 7.00    Pack years: 3.50    Types: Cigarettes    Last attempt to quit: 05/30/1989    Years since quitting: 28.8  . Smokeless tobacco: Never Used  Substance Use Topics  . Alcohol use: No    Alcohol/week: 0.0 standard drinks  . Drug use: No    Family History Family History  Problem Relation Age of Onset  . Heart attack Mother   No bleeding  or clotting disorders No aneurysms  Allergies  Allergen Reactions  . Carbamazepine Other (See Comments)    Reaction: unknown  . Cymbalta [Duloxetine Hcl] Other (See Comments)    Confusion, disorientation  . Duloxetine     Other reaction(s): Other (See Comments) Confusion, disorientation  . Lyrica [Pregabalin] Other (See Comments)    Patient and son  states this makes the patient confused.  . Temazepam     Other reaction(s): Hallucination     REVIEW OF SYSTEMS (Negative unless checked)  Constitutional: [] Weight loss  [] Fever  [] Chills Cardiac: [] Chest pain   [] Chest pressure   [x] Palpitations   [] Shortness of breath when laying flat   [x] Shortness of breath at rest   [x] Shortness of breath with exertion. Vascular:  [] Pain in legs with walking   [] Pain in legs at rest   [] Pain in legs when laying flat   [] Claudication   [] Pain in feet when walking  [] Pain in feet at rest  [] Pain in feet when laying flat   [] History of DVT   [] Phlebitis   [x] Swelling in legs   [] Varicose veins   [x] Non-healing ulcers Pulmonary:   [] Uses home oxygen   [] Productive cough   [] Hemoptysis   [] Wheeze  [] COPD   [] Asthma Neurologic:  [] Dizziness  [] Blackouts   [] Seizures   [] History of stroke   [] History of TIA  [] Aphasia   [] Temporary blindness   [] Dysphagia   [] Weakness or numbness in arms   [] Weakness or numbness in legs Musculoskeletal:  [] Arthritis   [] Joint swelling   [] Joint pain   [] Low back pain Hematologic:  [] Easy bruising  [] Easy bleeding   [] Hypercoagulable state   [] Anemic   Gastrointestinal:  [] Blood in stool   [] Vomiting blood  [] Gastroesophageal reflux/heartburn   [] Abdominal pain Genitourinary:  [x] Chronic kidney disease   [] Difficult urination  [] Frequent urination  [] Burning with urination   [] Hematuria Skin:  [] Rashes   [x] Ulcers   [x] Wounds Psychological:  [] History of anxiety   []  History of major depression.  Physical Examination  BP (!) 181/76 (BP Location: Left Arm, Patient Position:  Sitting, Cuff Size: Large)   Pulse (!) 59   Resp 14   Ht 5\' 1"  (1.549 m)   Wt 155 lb (70.3 kg)   BMI 29.29 kg/m  Gen:  Debilitated appearing, NAD Head: /AT, No temporalis wasting. Ear/Nose/Throat: Hearing grossly intact, nares w/o erythema or drainage Eyes: Conjunctiva clear. Sclera non-icteric Neck: Supple.  Trachea midline Pulmonary:  Good air movement, no use of accessory muscles.  Cardiac: irregular Vascular:  Vessel Right Left  Radial Palpable Palpable                          PT  trace palpable  not palpable  DP  1+ palpable  1+ palpable    Musculoskeletal: M/S 5/5 throughout.  No deformity or atrophy.  1-2+ bilateral lower extremity edema. Neurologic: Sensation grossly intact in extremities.  Symmetrical.  Speech is fluent.  Psychiatric: Judgment intact, Mood & affect appropriate for pt's clinical situation. Dermatologic: Several small superficial wounds of the left ankle and lower leg area.  No signs of purulent drainage or erythema.       Labs Recent Results (from the past 2160 hour(s))  CBC with Differential     Status: Abnormal   Collection Time: 03/08/18  3:56 PM  Result Value Ref Range   WBC 11.3 (H) 4.0 - 10.5 K/uL   RBC 4.30 3.87 - 5.11 MIL/uL   Hemoglobin 12.5 12.0 - 15.0 g/dL   HCT 38.0 36.0 - 46.0 %   MCV 88.4 80.0 - 100.0 fL   MCH 29.1 26.0 - 34.0 pg   MCHC 32.9 30.0 - 36.0 g/dL   RDW 13.9 11.5 - 15.5 %   Platelets 207 150 - 400 K/uL   nRBC 0.0 0.0 - 0.2 %  Neutrophils Relative % 71 %   Neutro Abs 8.1 (H) 1.7 - 7.7 K/uL   Lymphocytes Relative 16 %   Lymphs Abs 1.8 0.7 - 4.0 K/uL   Monocytes Relative 10 %   Monocytes Absolute 1.1 (H) 0.1 - 1.0 K/uL   Eosinophils Relative 2 %   Eosinophils Absolute 0.2 0.0 - 0.5 K/uL   Basophils Relative 0 %   Basophils Absolute 0.1 0.0 - 0.1 K/uL   Immature Granulocytes 1 %   Abs Immature Granulocytes 0.06 0.00 - 0.07 K/uL    Comment: Performed at [facility], [address]  [place], [place] [address]  Comprehensive metabolic panel     Status: Abnormal   Collection Time: 03/08/18  3:56 PM  Result Value Ref Range   Sodium 130 (L) 135 - 145 mmol/L   Potassium 4.6 3.5 - 5.1 mmol/L   Chloride 95 (L) 98 - 111 mmol/L   CO2 22 22 - 32 mmol/L   Glucose, Bld 409 (H) 70 - 99 mg/dL   BUN 76 (H) 8 - 23 mg/dL   Creatinine, Ser 2.70 (H) 0.44 - 1.00 mg/dL   Calcium 8.9 8.9 - 10.3 mg/dL   Total Protein 7.6 6.5 - 8.1 g/dL   Albumin 4.2 3.5 - 5.0 g/dL   AST 51 (H) 15 - 41 U/L   ALT 50 (H) 0 - 44 U/L   Alkaline Phosphatase 87 38 - 126 U/L   Total Bilirubin 0.6 0.3 - 1.2 mg/dL   GFR calc non Af Amer 16 (L) >60 mL/min   GFR calc Af Amer 18 (L) >60 mL/min   Anion gap 13 5 - 15    Comment: Performed at [facility], [address] [place], [place] [address]  Brain natriuretic peptide     Status: None   Collection Time: 03/08/18  3:56 PM  Result Value Ref Range   B Natriuretic Peptide 96.0 0.0 - 100.0 pg/mL    Comment: Performed at [facility], [address] [place], [place] [address]  Troponin I - Add-On to previous collection     Status: None   Collection Time: 03/08/18  7:58 PM  Result Value Ref Range   Troponin I <0.03 <0.03 ng/mL    Comment: Performed at [facility], [address] [place], [place] [address]  Glucose, capillary     Status: Abnormal   Collection Time: 03/09/18  2:52 AM  Result Value Ref Range   Glucose-Capillary 303 (H) 70 - 99 mg/dL   Comment 1 Notify RN    Comment 2 Document in Chart   Basic metabolic panel     Status: Abnormal   Collection Time: 03/09/18  3:54 AM  Result Value Ref Range   Sodium 134 (L) 135 - 145 mmol/L   Potassium 5.0 3.5 - 5.1 mmol/L    Comment: HEMOLYSIS AT THIS LEVEL MAY AFFECT RESULT   Chloride 97 (L) 98 - 111 mmol/L   CO2 24 22 - 32 mmol/L   Glucose, Bld 275 (H) 70 - 99 mg/dL   BUN 73 (H) 8 - 23 mg/dL   Creatinine, Ser 2.21 (H) 0.44 - 1.00 mg/dL   Calcium 9.0 8.9 - 10.3 mg/dL   GFR  calc non Af Amer 20 (L) >60 mL/min   GFR calc Af Amer 23 (L) >60 mL/min   Anion gap 13 5 - 15    Comment: Performed at [facility], [address], [place],  [address]  CBC  Status: Abnormal   Collection Time: 03/09/18  5:25 AM  Result Value Ref Range   WBC 10.9 (H) 4.0 - 10.5 K/uL   RBC 4.05 3.87 - 5.11 MIL/uL   Hemoglobin 11.8 (L) 12.0 - 15.0 g/dL   HCT 35.7 (L) 36.0 - 46.0 %   MCV 88.1 80.0 - 100.0 fL   MCH 29.1 26.0 - 34.0 pg   MCHC 33.1 30.0 - 36.0 g/dL   RDW 13.7 11.5 - 15.5 %   Platelets 207 150 - 400 K/uL   nRBC 0.0 0.0 - 0.2 %    Comment: Performed at [facility], [address], [place], [place] [address]  MRSA PCR Screening     Status: Abnormal   Collection Time: 03/09/18  5:42 AM  Result Value Ref Range   MRSA by PCR POSITIVE (A) NEGATIVE    Comment:        The GeneXpert MRSA Assay (FDA approved for NASAL specimens only), is one component of a comprehensive MRSA colonization surveillance program. It is not intended to diagnose MRSA infection nor to guide or monitor treatment for MRSA infections. RESULT CALLED TO, READ BACK BY AND VERIFIED WITH: STEPHANIE SUMMERS 03/09/2018 0818 MU Performed at [facility], [address] [place], [place] [address]   Glucose, capillary     Status: Abnormal   Collection Time: 03/09/18  8:01 AM  Result Value Ref Range   Glucose-Capillary 221 (H) 70 - 99 mg/dL   Comment 1 Notify RN    Comment 2 Document in Chart   Glucose, capillary     Status: Abnormal   Collection Time: 03/09/18 11:56 AM  Result Value Ref Range   Glucose-Capillary 205 (H) 70 - 99 mg/dL   Comment 1 Notify RN    Comment 2 Document in Chart   ECHOCARDIOGRAM COMPLETE     Status: None   Collection Time: 03/09/18 12:03 PM  Result Value Ref Range   Weight 2,480 oz   Height 65 in   BP 134/45 mmHg  Protein electrophoresis, serum     Status: Abnormal   Collection Time: 03/09/18  4:18 PM  Result Value Ref Range   Total  Protein ELP 6.4 6.0 - 8.5 g/dL   Albumin ELP 3.3 2.9 - 4.4 g/dL   Alpha-1-Globulin 0.2 0.0 - 0.4 g/dL   Alpha-2-Globulin 1.2 (H) 0.4 - 1.0 g/dL   Beta Globulin 0.9 0.7 - 1.3 g/dL   Gamma Globulin 0.8 0.4 - 1.8 g/dL   M-Spike, % Not Observed Not Observed g/dL   SPE Interp. Comment     Comment: (NOTE) The SPE pattern is suggestive of a subacute inflammatory response. This condition represents an intermediate stage between two possible courses for acute inflammation: total convalescense with a return to normal, or the onset of a chronic inflammatory condition. Performed At: [facility] [place], [place] [id number] Rush Farmer MD CV:[id number]    Comment Comment     Comment: (NOTE) Protein electrophoresis scan will follow via computer, mail, or courier delivery.    Globulin, Total 3.1 2.2 - 3.9 g/dL   A/G Ratio 1.1 0.7 - 1.7  ANA w/Reflex if Positive     Status: None   Collection Time: 03/09/18  4:18 PM  Result Value Ref Range   Anti Nuclear Antibody(ANA) Negative Negative    Comment: (NOTE) Performed At: [facility] [address] [place], [place] [id number] Rush Farmer MD [id number]   Glucose, capillary     Status: Abnormal  Collection Time: 03/09/18  4:50 PM  Result Value Ref Range   Glucose-Capillary 205 (H) 70 - 99 mg/dL  Protein Electro, Random Urine     Status: None   Collection Time: 03/09/18  6:23 PM  Result Value Ref Range   Total Protein, Urine 55.0 Not Estab. mg/dL   Albumin ELP, Urine 69.0 %   Alpha-1-Globulin, U 1.1 %   Alpha-2-Globulin, U 3.7 %   Beta Globulin, U 14.9 %   Gamma Globulin, U 11.3 %   M Component, Ur Not Observed Not Observed %   Please Note: Comment     Comment: (NOTE) Protein electrophoresis scan will follow via computer, mail, or courier delivery. Performed At: [facility] [place], [place] [id number] Rush Farmer MD PP:[id number]   Protein / creatinine ratio, urine      Status: Abnormal   Collection Time: 03/09/18  6:23 PM  Result Value Ref Range   Creatinine, Urine 34 mg/dL   Total Protein, Urine 61 mg/dL    Comment: NO NORMAL RANGE ESTABLISHED FOR THIS TEST   Protein Creatinine Ratio 1.79 (H) 0.00 - 0.15 mg/mg[Cre]    Comment: Performed at [facility], [address] [place], [place] [address]  Glucose, capillary     Status: Abnormal   Collection Time: 03/09/18  9:04 PM  Result Value Ref Range   Glucose-Capillary 163 (H) 70 - 99 mg/dL  Basic metabolic panel     Status: Abnormal   Collection Time: 03/10/18  5:17 AM  Result Value Ref Range   Sodium 136 135 - 145 mmol/L   Potassium 4.1 3.5 - 5.1 mmol/L   Chloride 100 98 - 111 mmol/L   CO2 27 22 - 32 mmol/L   Glucose, Bld 175 (H) 70 - 99 mg/dL   BUN 69 (H) 8 - 23 mg/dL   Creatinine, Ser 1.88 (H) 0.44 - 1.00 mg/dL   Calcium 8.7 (L) 8.9 - 10.3 mg/dL   GFR calc non Af Amer 24 (L) >60 mL/min   GFR calc Af Amer 28 (L) >60 mL/min   Anion gap 9 5 - 15    Comment: Performed at [facility], [address] [place], [place] [address]  CBC     Status: Abnormal   Collection Time: 03/10/18  5:17 AM  Result Value Ref Range   WBC 9.3 4.0 - 10.5 K/uL   RBC 3.68 (L) 3.87 - 5.11 MIL/uL   Hemoglobin 10.7 (L) 12.0 - 15.0 g/dL   HCT 33.1 (L) 36.0 - 46.0 %   MCV 89.9 80.0 - 100.0 fL   MCH 29.1 26.0 - 34.0 pg   MCHC 32.3 30.0 - 36.0 g/dL   RDW 13.5 11.5 - 15.5 %   Platelets 204 150 - 400 K/uL   nRBC 0.0 0.0 - 0.2 %    Comment: Performed at [facility], [address], [place], [place] [address]  Magnesium     Status: None   Collection Time: 03/10/18  5:17 AM  Result Value Ref Range   Magnesium 2.1 1.7 - 2.4 mg/dL    Comment: Performed at [facility], [address] [place], [place] [address]  Phosphorus     Status: None   Collection Time: 03/10/18  5:17 AM  Result Value Ref Range   Phosphorus 3.3 2.5 - 4.6 mg/dL    Comment: Performed at [facility],  [address] [place], [place] [address]  Glucose, capillary     Status: Abnormal  Collection Time: 03/10/18  7:37 AM  Result Value Ref Range   Glucose-Capillary 166 (H) 70 - 99 mg/dL   Comment 1 Notify RN    Comment 2 Document in Chart   Glucose, capillary     Status: Abnormal   Collection Time: 03/10/18 11:48 AM  Result Value Ref Range   Glucose-Capillary 190 (H) 70 - 99 mg/dL   Comment 1 Notify RN    Comment 2 Document in Chart   Glucose, capillary     Status: Abnormal   Collection Time: 03/10/18  4:20 PM  Result Value Ref Range   Glucose-Capillary 237 (H) 70 - 99 mg/dL   Comment 1 Notify RN    Comment 2 Document in Chart   Glucose, capillary     Status: Abnormal   Collection Time: 03/10/18  9:52 PM  Result Value Ref Range   Glucose-Capillary 178 (H) 70 - 99 mg/dL  Basic metabolic panel     Status: Abnormal   Collection Time: 03/11/18  5:14 AM  Result Value Ref Range   Sodium 136 135 - 145 mmol/L   Potassium 4.2 3.5 - 5.1 mmol/L   Chloride 102 98 - 111 mmol/L   CO2 23 22 - 32 mmol/L   Glucose, Bld 173 (H) 70 - 99 mg/dL   BUN 48 (H) 8 - 23 mg/dL   Creatinine, Ser 1.51 (H) 0.44 - 1.00 mg/dL   Calcium 8.6 (L) 8.9 - 10.3 mg/dL   GFR calc non Af Amer 32 (L) >60 mL/min   GFR calc Af Amer 37 (L) >60 mL/min   Anion gap 11 5 - 15    Comment: Performed at [facility], [address] [place], [place] [address]  Magnesium     Status: None   Collection Time: 03/11/18  5:14 AM  Result Value Ref Range   Magnesium 2.0 1.7 - 2.4 mg/dL    Comment: Performed at [facility], [address] [place], [place] [address]  Glucose, capillary     Status: Abnormal   Collection Time: 03/11/18  7:42 AM  Result Value Ref Range   Glucose-Capillary 209 (H) 70 - 99 mg/dL  Glucose, capillary     Status: Abnormal   Collection Time: 03/11/18 11:28 AM  Result Value Ref Range   Glucose-Capillary 281 (H) 70 - 99 mg/dL  Glucose, capillary     Status: Abnormal   Collection  Time: 03/11/18  5:03 PM  Result Value Ref Range   Glucose-Capillary 144 (H) 70 - 99 mg/dL  Glucose, capillary     Status: Abnormal   Collection Time: 03/11/18  9:23 PM  Result Value Ref Range   Glucose-Capillary 191 (H) 70 - 99 mg/dL  Glucose, capillary     Status: Abnormal   Collection Time: 03/12/18  8:02 AM  Result Value Ref Range   Glucose-Capillary 192 (H) 70 - 99 mg/dL  Basic metabolic panel     Status: Abnormal   Collection Time: 03/12/18  8:15 AM  Result Value Ref Range   Sodium 136 135 - 145 mmol/L   Potassium 4.0 3.5 - 5.1 mmol/L   Chloride 103 98 - 111 mmol/L   CO2 23 22 - 32 mmol/L   Glucose, Bld 201 (H) 70 - 99 mg/dL   BUN 25 (H) 8 - 23 mg/dL   Creatinine, Ser 1.12 (H) 0.44 - 1.00 mg/dL   Calcium 8.4 (L) 8.9 - 10.3 mg/dL   GFR calc non Af Amer 46 (L) >60 mL/min  GFR calc Af Amer 53 (L) >60 mL/min   Anion gap 10 5 - 15    Comment: Performed at [facility], [address] [place], [place] [address]  Glucose, capillary     Status: Abnormal   Collection Time: 03/12/18 12:12 PM  Result Value Ref Range   Glucose-Capillary 219 (H) 70 - 99 mg/dL  CBC with Differential     Status: Abnormal   Collection Time: 03/27/18 12:50 AM  Result Value Ref Range   WBC 10.1 4.0 - 10.5 K/uL   RBC 3.84 (L) 3.87 - 5.11 MIL/uL   Hemoglobin 11.0 (L) 12.0 - 15.0 g/dL   HCT 33.6 (L) 36.0 - 46.0 %   MCV 87.5 80.0 - 100.0 fL   MCH 28.6 26.0 - 34.0 pg   MCHC 32.7 30.0 - 36.0 g/dL   RDW 13.6 11.5 - 15.5 %   Platelets 324 150 - 400 K/uL   nRBC 0.0 0.0 - 0.2 %   Neutrophils Relative % 61 %   Neutro Abs 6.3 1.7 - 7.7 K/uL   Lymphocytes Relative 26 %   Lymphs Abs 2.6 0.7 - 4.0 K/uL   Monocytes Relative 8 %   Monocytes Absolute 0.8 0.1 - 1.0 K/uL   Eosinophils Relative 3 %   Eosinophils Absolute 0.3 0.0 - 0.5 K/uL   Basophils Relative 1 %   Basophils Absolute 0.1 0.0 - 0.1 K/uL   Immature Granulocytes 1 %   Abs Immature Granulocytes 0.07 0.00 - 0.07 K/uL    Comment:  Performed at [facility], [address] [place], [place] [address]  Comprehensive metabolic panel     Status: Abnormal   Collection Time: 03/27/18 12:50 AM  Result Value Ref Range   Sodium 134 (L) 135 - 145 mmol/L   Potassium 3.6 3.5 - 5.1 mmol/L   Chloride 100 98 - 111 mmol/L   CO2 22 22 - 32 mmol/L   Glucose, Bld 181 (H) 70 - 99 mg/dL   BUN 32 (H) 8 - 23 mg/dL   Creatinine, Ser 1.73 (H) 0.44 - 1.00 mg/dL   Calcium 8.7 (L) 8.9 - 10.3 mg/dL   Total Protein 6.7 6.5 - 8.1 g/dL   Albumin 3.4 (L) 3.5 - 5.0 g/dL   AST 30 15 - 41 U/L   ALT 21 0 - 44 U/L   Alkaline Phosphatase 85 38 - 126 U/L   Total Bilirubin 0.5 0.3 - 1.2 mg/dL   GFR calc non Af Amer 27 (L) >60 mL/min   GFR calc Af Amer 31 (L) >60 mL/min   Anion gap 12 5 - 15    Comment: Performed at [facility], [address] [place], [place] [address]  Troponin I - Once     Status: None   Collection Time: 03/27/18 12:50 AM  Result Value Ref Range   Troponin I <0.03 <0.03 ng/mL    Comment: Performed at [facility], [address] [place], [place] [address]  Urinalysis, Complete w Microscopic     Status: Abnormal   Collection Time: 03/27/18 12:50 AM  Result Value Ref Range   Color, Urine YELLOW (A) YELLOW   APPearance CLOUDY (A) CLEAR   Specific Gravity, Urine 1.010 1.005 - 1.030   pH 5.0 5.0 - 8.0   Glucose, UA NEGATIVE NEGATIVE mg/dL   Hgb urine dipstick SMALL (A) NEGATIVE   Bilirubin Urine NEGATIVE NEGATIVE   Ketones, ur NEGATIVE NEGATIVE mg/dL   Protein, ur 100 (A) NEGATIVE mg/dL  Nitrite NEGATIVE NEGATIVE   Leukocytes,Ua LARGE (A) NEGATIVE   RBC / HPF 6-10 0 - 5 RBC/hpf   WBC, UA >50 (H) 0 - 5 WBC/hpf   Bacteria, UA MANY (A) NONE SEEN   Squamous Epithelial / LPF 0-5 0 - 5   Mucus PRESENT     Comment: Performed at [facility], [address], [place], [place] [address]  Urine culture     Status: Abnormal   Collection Time: 03/27/18 12:50 AM  Result Value Ref Range    Specimen Description      URINE, RANDOM Performed at [facility], [address], [place], [place] [address]    Special Requests      NONE Performed at [facility], [place], [place] [address]    Culture (A)     >=100,000 COLONIES/mL GROUP B STREP(S.AGALACTIAE)ISOLATED TESTING AGAINST S. AGALACTIAE NOT ROUTINELY PERFORMED DUE TO PREDICTABILITY OF AMP/PEN/VAN SUSCEPTIBILITY. Performed at [facility], [place] [address], [place], [place] [address]    Report Status 03/28/2018 FINAL   Culture, blood (routine x 2)     Status: None   Collection Time: 03/27/18 12:50 AM  Result Value Ref Range   Specimen Description BLOOD RIGHT FOREARM    Special Requests      BOTTLES DRAWN AEROBIC AND ANAEROBIC Blood Culture adequate volume   Culture      NO GROWTH 5 DAYS Performed at [facility], [address], [place], [place] [address]    Report Status 04/01/2018 FINAL   Culture, blood (routine x 2)     Status: None   Collection Time: 03/27/18 12:50 AM  Result Value Ref Range   Specimen Description BLOOD BLOOD RIGHT FOREARM    Special Requests      BOTTLES DRAWN AEROBIC AND ANAEROBIC Blood Culture adequate volume   Culture      NO GROWTH 5 DAYS Performed at [facility], [address] [place], [place] [address]    Report Status 04/01/2018 FINAL   Lactic acid, plasma     Status: None   Collection Time: 03/27/18 12:50 AM  Result Value Ref Range   Lactic Acid, Venous 1.4 0.5 - 1.9 mmol/L    Comment: Performed at [facility], [address] [place], [place] [address]    Radiology Dg Chest 2 View  Result Date: 03/08/2018 CLINICAL DATA:  CHF, shortness of breath EXAM: CHEST - 2 VIEW COMPARISON:  03/23/2017 FINDINGS: Mild cardiomegaly with vascular congestion and chronic appearing interstitial prominence. Query chronic interstitial lung disease. No definite focal pneumonia, collapse or consolidation. No effusion or  pneumothorax. Apical pleural thickening noted, worse on the right. Trachea is midline. Aorta atherosclerotic. Degenerative changes noted spine. IMPRESSION: Stable cardiomegaly with vascular congestion and chronic interstitial changes. No superimposed acute process or interval change by plain radiography Electronically Signed   By: Jerilynn Mages.  Shick M.D.   On: 03/08/2018 17:10   US Renal  Result Date: 03/09/2018 CLINICAL DATA:  Initial evaluation for acute renal failure. EXAM: RENAL / URINARY TRACT ULTRASOUND COMPLETE COMPARISON:  Prior CT from 04/17/2017. FINDINGS: Right Kidney: Renal measurements: 11.0 x 4.2 x 3.9 cm = volume: 94.1 mL. Diffusely increased echogenicity within the renal parenchyma, compatible with medical renal disease. No hydronephrosis. No discrete mass. 10 mm shadowing echogenic lesion within the lower pole, possibly reflecting a nonobstructive stone. Left Kidney: Renal measurements: 9.0 x 3.9 x 3.7 cm = volume: 68.4 mL. Diffusely increased echogenicity within the renal parenchyma,  suggesting medical renal disease. No hydronephrosis. Chronic cortical thinning noted, most notable at the lower pole. No definite focal lesion identified, although examination somewhat technically limited with poor visualization of the left kidney. Bladder: Appears normal for degree of bladder distention. IMPRESSION: 1. Increased echogenicity within the renal parenchyma bilaterally, compatible with medical renal disease. 2. No hydronephrosis. 3. Possible 10 mm nonobstructive right renal nephrolithiasis. Electronically Signed   By: Jeannine Boga M.D.   On: 03/09/2018 16:21   Dg Chest Port 1 View  Result Date: 03/27/2018 CLINICAL DATA:  Bilateral leg pain. EXAM: PORTABLE CHEST 1 VIEW COMPARISON:  03/08/2018 FINDINGS: Mild cardiac enlargement. No vascular congestion, edema, or consolidation. Central interstitial pattern with mild bronchiectasis and bronchial wall thickening likely representing chronic bronchitis. No  blunting of costophrenic angles. No pneumothorax. Calcification of the aorta. Degenerative changes in the spine and shoulders. IMPRESSION: Cardiac enlargement. Chronic bronchitic changes in the lungs. No evidence of active pulmonary disease. Electronically Signed   By: Lucienne Capers M.D.   On: 03/27/2018 00:54    Assessment/Plan  Diabetic neuropathy (HCC) blood glucose control important in reducing the progression of atherosclerotic disease. Also, involved in wound healing. On appropriate medications.   CKD (chronic kidney disease), stage III ([place]) Can contribute to LE swelling.  Also a risk with any dye related procedures.   HTN (hypertension) blood pressure control important in reducing the progression of atherosclerotic disease. On appropriate oral medications.   Bilateral lower extremity edema Swelling is under better control with weekly Unna boot wraps although not entirely resolved.  A 3 layer Unna boot was placed on both lower extremities today.  Continue weekly changes.  Atherosclerosis of native arteries of the extremities with ulceration (HCC) ABIs today were 0.95 on the right and 0.89 on the left.  Although the waveforms were technically monophasic, they were quite strong.  These levels are stable from her previous study almost a year ago which is encouraging.  She should have adequate perfusion to heal the wounds.  At this point, I would recommend weekly Unna boot wraps for 3-4 more weeks and then we can reassess her legs to come out of Unna boots.  A lymphedema pump is pending at this point but the company has contacted the patient's son.  Hopefully we can get that arranged in the next couple of weeks as well.  Lymphedema Lymphedema pump is pending.  Swelling is still present despite weekly Unna boots although it has improved.  We will continue the Unna boots for now and try to get the lymphedema pump.  When she comes out of the Unna boots, she will need to wear compression  stockings regularly.    Leotis Pain, MD  04/06/2018 4:42 PM    This note was created with Dragon medical transcription system.  Any errors from dictation are purely unintentional

## 2018-04-06 NOTE — Assessment & Plan Note (Addendum)
Swelling is under better control with weekly Unna boot wraps although not entirely resolved.  A 3 layer Unna boot was placed on both lower extremities today.  Continue weekly changes.

## 2018-04-06 NOTE — Therapy (Signed)
[place] MAIN [facility] SERVICES [address] [place], [place], [address] Phone: [phone]   Fax:  [phone]  April 06, 2018      Occupational Therapy Discharge Summary   Patient: Sharon [name] MRN: [id number] Date of Birth: April 02, 1933  Diagnosis: No diagnosis found.  Referring Provider (OT): Emily Filbert   The above patient had been seen in Occupational Therapy however has been hospitalized since the last visit.  The treatment consisted of ADL training, A/E training, UE there. Ex, neuromuscular re education. The patient is: improved  Subjective: Pt. had has not returned top therapy secondary to a decline in medical status, and hospitalization.    Functional Status at Discharge:   OT Long Term Goals - 02/22/18 1557      OT LONG TERM GOAL #1   Title  Patient will demonstrate increase in grip strength by 5# to be able to hold utensils in right hand for self feeding.     Baseline  Pt. continues to use her left hand to feed self     Time  6    Period Weeks   Status Not met   Target Date  03/24/18      OT LONG TERM GOAL #2   Title  Patient will demonstrate improved coordination to pick up small objects in right hand 1/2 inch in size.     Baseline  Pt. is improving with grasping small objects    Time  12    Period  Weeks    Status  not met   Target Date  03/24/18      OT LONG TERM GOAL #3   Title  Patient to perform UB dressing with modified independence     Baseline  min assist at eval    Time  6    Period  Weeks    Status  not met   Target Date  03/24/18      OT LONG TERM GOAL #4   Title  Patient will complete bathing with supervision    Baseline  moderate assist at eval     Period  Weeks    Status Not met   Target Date  03/24/18      OT LONG TERM GOAL #5   Title  Patient will demonstrate HEP independently    Baseline  Pt. requires assist.    Time  12    Period  Weeks    Status Not met   Target Date  03/24/18       OT LONG TERM GOAL #6   Title  Patient will demonstrate ability to hold pen and write/sign name on important papers with 75% legibility     Baseline  Pt. conitnues to have difficulty holding pen and legiblity less than 25%     Time  12    Period  weeks   Status Not met   Target Date  03/24/18         Sincerely,  Harrel Carina, MS, OTR/L     [place] [address] [place], [place], [address] Phone: [phone]   Fax:  [phone]  Patient: Sharon [name] MRN: [id number] Date of Birth: 08/13/1933

## 2018-04-07 ENCOUNTER — Encounter: Payer: PPO | Admitting: Occupational Therapy

## 2018-04-07 ENCOUNTER — Ambulatory Visit: Payer: PPO | Admitting: Physical Therapy

## 2018-04-12 ENCOUNTER — Encounter: Payer: PPO | Admitting: Occupational Therapy

## 2018-04-12 ENCOUNTER — Ambulatory Visit: Payer: PPO | Admitting: Physical Therapy

## 2018-04-13 ENCOUNTER — Encounter: Payer: PPO | Admitting: Physician Assistant

## 2018-04-13 ENCOUNTER — Other Ambulatory Visit: Payer: Self-pay

## 2018-04-13 DIAGNOSIS — E11621 Type 2 diabetes mellitus with foot ulcer: Secondary | ICD-10-CM | POA: Diagnosis not present

## 2018-04-13 DIAGNOSIS — I890 Lymphedema, not elsewhere classified: Secondary | ICD-10-CM | POA: Diagnosis not present

## 2018-04-13 DIAGNOSIS — L97522 Non-pressure chronic ulcer of other part of left foot with fat layer exposed: Secondary | ICD-10-CM | POA: Diagnosis not present

## 2018-04-13 DIAGNOSIS — L97529 Non-pressure chronic ulcer of other part of left foot with unspecified severity: Secondary | ICD-10-CM | POA: Diagnosis not present

## 2018-04-14 ENCOUNTER — Ambulatory Visit: Payer: PPO | Admitting: Physical Therapy

## 2018-04-14 DIAGNOSIS — I70203 Unspecified atherosclerosis of native arteries of extremities, bilateral legs: Secondary | ICD-10-CM | POA: Diagnosis not present

## 2018-04-14 DIAGNOSIS — E1142 Type 2 diabetes mellitus with diabetic polyneuropathy: Secondary | ICD-10-CM | POA: Diagnosis not present

## 2018-04-14 DIAGNOSIS — H3530 Unspecified macular degeneration: Secondary | ICD-10-CM | POA: Diagnosis not present

## 2018-04-14 DIAGNOSIS — Z79891 Long term (current) use of opiate analgesic: Secondary | ICD-10-CM | POA: Diagnosis not present

## 2018-04-14 DIAGNOSIS — N170 Acute kidney failure with tubular necrosis: Secondary | ICD-10-CM | POA: Diagnosis not present

## 2018-04-14 DIAGNOSIS — M810 Age-related osteoporosis without current pathological fracture: Secondary | ICD-10-CM | POA: Diagnosis not present

## 2018-04-14 DIAGNOSIS — L89610 Pressure ulcer of right heel, unstageable: Secondary | ICD-10-CM | POA: Diagnosis not present

## 2018-04-14 DIAGNOSIS — L89623 Pressure ulcer of left heel, stage 3: Secondary | ICD-10-CM | POA: Diagnosis not present

## 2018-04-14 DIAGNOSIS — I480 Paroxysmal atrial fibrillation: Secondary | ICD-10-CM | POA: Diagnosis not present

## 2018-04-14 DIAGNOSIS — D631 Anemia in chronic kidney disease: Secondary | ICD-10-CM | POA: Diagnosis not present

## 2018-04-14 DIAGNOSIS — Z8674 Personal history of sudden cardiac arrest: Secondary | ICD-10-CM | POA: Diagnosis not present

## 2018-04-14 DIAGNOSIS — L89322 Pressure ulcer of left buttock, stage 2: Secondary | ICD-10-CM | POA: Diagnosis not present

## 2018-04-14 DIAGNOSIS — Z4800 Encounter for change or removal of nonsurgical wound dressing: Secondary | ICD-10-CM | POA: Diagnosis not present

## 2018-04-14 DIAGNOSIS — L89153 Pressure ulcer of sacral region, stage 3: Secondary | ICD-10-CM | POA: Diagnosis not present

## 2018-04-14 DIAGNOSIS — E1151 Type 2 diabetes mellitus with diabetic peripheral angiopathy without gangrene: Secondary | ICD-10-CM | POA: Diagnosis not present

## 2018-04-14 DIAGNOSIS — I2510 Atherosclerotic heart disease of native coronary artery without angina pectoris: Secondary | ICD-10-CM | POA: Diagnosis not present

## 2018-04-14 DIAGNOSIS — N183 Chronic kidney disease, stage 3 (moderate): Secondary | ICD-10-CM | POA: Diagnosis not present

## 2018-04-14 DIAGNOSIS — Z9181 History of falling: Secondary | ICD-10-CM | POA: Diagnosis not present

## 2018-04-14 DIAGNOSIS — I5033 Acute on chronic diastolic (congestive) heart failure: Secondary | ICD-10-CM | POA: Diagnosis not present

## 2018-04-14 DIAGNOSIS — I130 Hypertensive heart and chronic kidney disease with heart failure and stage 1 through stage 4 chronic kidney disease, or unspecified chronic kidney disease: Secondary | ICD-10-CM | POA: Diagnosis not present

## 2018-04-14 DIAGNOSIS — Z7984 Long term (current) use of oral hypoglycemic drugs: Secondary | ICD-10-CM | POA: Diagnosis not present

## 2018-04-14 NOTE — Progress Notes (Signed)
MYANNA, ZIESMER ([id number]) Visit Report for 04/13/2018 Chief Complaint Document Details Patient Name: Sharon [name], Sharon [name]. Date of Service: 04/13/2018 10:45 AM Medical Record Number: [id number] Patient Account Number: [id number] Date of Birth/Sex: 1933-05-08 (85 y.o. F) Treating RN: Montey Hora Primary Care Provider: Emily Filbert Other Clinician: Referring Provider: Emily Filbert Treating Provider/Extender: Melburn Hake, Retta Pitcher Weeks in Treatment: 3 Information Obtained from: Patient Chief Complaint Multiple bilateral LE ulcers Electronic Signature(s) Signed: 04/13/2018 8:26:01 PM By: Worthy Keeler PA-C Entered By: Worthy Keeler on 04/13/2018 11:32:47 Wurzer, Susana [name]. ([id number]) -------------------------------------------------------------------------------- HPI Details Patient Name: Sharon [name], Sharon [name]. Date of Service: 04/13/2018 10:45 AM Medical Record Number: [id number] Patient Account Number: [id number] Date of Birth/Sex: 1933-06-02 (85 y.o. F) Treating RN: Montey Hora Primary Care Provider: Emily Filbert Other Clinician: Referring Provider: Emily Filbert Treating Provider/Extender: Melburn Hake, Keniesha Adderly Weeks in Treatment: 3 History of Present Illness HPI Description: 03/23/18 On evaluation today patient presents for initial evaluation our clinic concerning issues that she's been having with the bilateral lower extremities. She has been out of the hospital for about a week and was there for about four days due to fluid overload. She does have congestive heart failure, atrial fibrillation, diabetes mellitus type II, and a right first toe amputation. Currently she is having bilateral lower extremity weeping and swelling. During her time in the hospital that she took her off of her diuretic medications according to what her son is telling me today. With that being said her primary care provider, Dr. Sabra Heck, has placed her back on some of these medications according to what her son is  telling me. She has undergone previous testing including hemoglobin A1c of 7.6 on 01/11/18. She's also had arterial studies which show that her right ABI was 0.89 and the left ABI of 0.94 this obviously is good as well. Currently the biggest concern with both the patient and her son are issues with overall general fluid overload and the fact that just wrapping her legs or managing the legs as we would do here in the clinic will not treat the overall fluid overload issue which I completely agree with. With that being said I think that we definitely need to try to get these areas closed as quickly as possible in order to prevent any chance for infection/cellulitis. 03/30/18 on evaluation today patient actually appears to be doing much better in regard to her lower extremity ulcers. She's been tolerating the dressing changes at this point without complication overall I do not see any signs of anything worsening which is good news. Nonetheless I am hopeful that she will continue to make good improvement she still has a lot of drainage we may want to increase her compression wrap to something a little bit stronger. 04/13/18 on evaluation today patient appears to be doing very well in regard to her lower extremity ulcers. The hill ulcers appear to be the worst but there still making good improvement overall in my pinion. Fortunately there's no evidence of active infection at this time in her swelling seems to be well controlled with the compression wrap. Overall very pleased in this regard. Electronic Signature(s) Signed: 04/13/2018 8:26:01 PM By: Worthy Keeler PA-C Entered By: Worthy Keeler on 04/13/2018 12:22:59 [place]ig[place]i, Sharon [name] ([id number]) -------------------------------------------------------------------------------- Physical Exam Details Patient Name: [place], Qunisha [name]. Date of Service: 04/13/2018 10:45 AM Medical Record Number: [id number] Patient Account Number: [id number] Date of  Birth/Sex: 11-05-1933 (85 y.o. F) Treating RN: Montey Hora Primary Care Provider: Emily Filbert Other  Clinician: Referring Provider: Emily Filbert Treating Provider/Extender: STONE III, Cherrelle Plante Weeks in Treatment: 3 Constitutional Well-nourished and well-hydrated in no acute distress. Respiratory normal breathing without difficulty. clear to auscultation bilaterally. Cardiovascular regular rate and rhythm with normal S1, S2. Psychiatric this patient is able to make decisions and demonstrates good insight into disease process. Alert and Oriented x 3. pleasant and cooperative. Notes Patient's wound bed currently showed signs of good granulation at this time there does not appear to be any evidence of active infection currently. Fortunately there's no signs of significant inflammation either an overall I feel like the patient seems to be making good progress as far as her wounds are concerned no sharp debridement was required at any location today. Electronic Signature(s) Signed: 04/13/2018 8:26:01 PM By: Worthy Keeler PA-C Entered By: Worthy Keeler on 04/13/2018 12:23:28 Sharon [name], Sharon [name] ([id number]) -------------------------------------------------------------------------------- Physician Orders Details Patient Name: Gruner, Therisa [name]. Date of Service: 04/13/2018 10:45 AM Medical Record Number: [id number] Patient Account Number: [id number] Date of Birth/Sex: Aug 15, 1933 (85 y.o. F) Treating RN: Montey Hora Primary Care Provider: Emily Filbert Other Clinician: Referring Provider: Emily Filbert Treating Provider/Extender: Melburn Hake, Yeva Bissette Weeks in Treatment: 3 Verbal / Phone Orders: No Diagnosis Coding ICD-10 Coding Code Description E11.621 Type 2 diabetes mellitus with foot ulcer I89.0 Lymphedema, not elsewhere classified L97.822 Non-pressure chronic ulcer of other part of left lower leg with fat layer exposed L97.812 Non-pressure chronic ulcer of other part of right lower leg with  fat layer exposed L97.512 Non-pressure chronic ulcer of other part of right foot with fat layer exposed L97.522 Non-pressure chronic ulcer of other part of left foot with fat layer exposed I73.89 Other specified peripheral vascular diseases Wound Cleansing Wound #1 Left,Dorsal Foot o Cleanse wound with mild soap and water Wound #2 Left,Dorsal Toe Second o Cleanse wound with mild soap and water Wound #3 Left,Anterior Lower Leg o Cleanse wound with mild soap and water Wound #6 Left Calcaneus o Cleanse wound with mild soap and water Wound #7 Right Calcaneus o Cleanse wound with mild soap and water Primary Wound Dressing Wound #1 Left,Dorsal Foot o Silver Alginate Wound #2 Left,Dorsal Toe Second o Silver Alginate Wound #3 Left,Anterior Lower Leg o Silver Alginate Wound #6 Left Calcaneus o Silver Alginate Wound #7 Right Calcaneus o Other: - paint with betadine and dry gauze over Sharon [name], Sharon [name]. ([id number]) Secondary Dressing Wound #1 Left,Dorsal Foot o ABD pad Wound #2 Left,Dorsal Toe Second o ABD pad Wound #3 Left,Anterior Lower Leg o ABD pad Wound #6 Left Calcaneus o ABD pad Wound #7 Right Calcaneus o ABD pad Dressing Change Frequency Wound #1 Left,Dorsal Foot o Other: - twice weekly - Tuesdays and Fridays Wound #2 Left,Dorsal Toe Second o Other: - twice weekly - Tuesdays and Fridays Wound #3 Left,Anterior Lower Leg o Other: - twice weekly - Tuesdays and Fridays Wound #6 Left Calcaneus o Other: - twice weekly - Tuesdays and Fridays Wound #7 Right Calcaneus o Other: - twice weekly - Tuesdays and Fridays Follow-up Appointments o Return Appointment in 2 weeks. - HHRN to visit patient on Tuesdays and Fridays when patient does not come into [facility] and Fridays when she does come into clinic Edema Control Wound #1 Left,Dorsal Foot o 4 Layer Compression System - Bilateral - Please wrap from toes to 3cm below  the knee Wound #2 Left,Dorsal Toe Second o 4 Layer Compression System - Bilateral - Please wrap from toes to 3cm below the knee Wound #3 Left,Anterior Lower  Leg o 4 Layer Compression System - Bilateral - Please wrap from toes to 3cm below the knee Wound #6 Left Calcaneus o 4 Layer Compression System - Bilateral - Please wrap from toes to 3cm below the knee Wound #7 Right Calcaneus o 4 Layer Compression System - Bilateral - Please wrap from toes to 3cm below the knee [place] #1 [place] Visits - [place] Nurse may visit PRN to address patientos wound care needs. IRASEMA, CHALK ([id number]) o FACE TO FACE ENCOUNTER: MEDICARE and MEDICAID PATIENTS: I certify that this patient is under my care and that I had a face-to-face encounter that meets the physician face-to-face encounter requirements with this patient on this date. The encounter with the patient was in whole or in part for the following MEDICAL CONDITION: (primary reason for [place]) MEDICAL NECESSITY: I certify, that based on my findings, NURSING services are a medically necessary home health service. HOME BOUND STATUS: I certify that my clinical findings support that this patient is homebound (i.e., Due to illness or injury, pt requires aid of supportive devices such as crutches, cane, wheelchairs, walkers, the use of special transportation or the assistance of another person to leave their place of residence. There is a normal inability to leave the home and doing so requires considerable and taxing effort. Other absences are for medical reasons / religious services and are infrequent or of short duration when for other reasons). o If current dressing causes regression in wound condition, may D/C ordered dressing product/s and apply Normal Saline Moist Dressing daily until next [place] / Other MD appointment. [place]  of regression in wound condition at [phone]. o Please direct any NON-WOUND related issues/requests for orders to patient's Primary Care Physician Wound #2 [place] Visits - [place] Nurse may visit PRN to address patientos wound care needs. o FACE TO FACE ENCOUNTER: MEDICARE and MEDICAID PATIENTS: I certify that this patient is under my care and that I had a face-to-face encounter that meets the physician face-to-face encounter requirements with this patient on this date. The encounter with the patient was in whole or in part for the following MEDICAL CONDITION: (primary reason for [place]) MEDICAL NECESSITY: I certify, that based on my findings, NURSING services are a medically necessary home health service. HOME BOUND STATUS: I certify that my clinical findings support that this patient is homebound (i.e., Due to illness or injury, pt requires aid of supportive devices such as crutches, cane, wheelchairs, walkers, the use of special transportation or the assistance of another person to leave their place of residence. There is a normal inability to leave the home and doing so requires considerable and taxing effort. Other absences are for medical reasons / religious services and are infrequent or of short duration when for other reasons). o If current dressing causes regression in wound condition, may D/C ordered dressing product/s and apply Normal Saline Moist Dressing daily until next [place] / Other MD appointment. [place] of regression in wound condition at [phone]. o Please direct any NON-WOUND related issues/requests for orders to patient's Primary Care Physician Wound #3 [place] Visits - [place] Nurse may visit PRN to address patientos wound care needs. o FACE TO FACE ENCOUNTER: MEDICARE and MEDICAID PATIENTS: I  certify that this patient is under my care and that  I had a face-to-face encounter that meets the physician face-to-face encounter requirements with this patient on this date. The encounter with the patient was in whole or in part for the following MEDICAL CONDITION: (primary reason for [place]) MEDICAL NECESSITY: I certify, that based on my findings, NURSING services are a medically necessary home health service. HOME BOUND STATUS: I certify that my clinical findings support that this patient is homebound (i.e., Due to illness or injury, pt requires aid of supportive devices such as crutches, cane, wheelchairs, walkers, the use of special transportation or the assistance of another person to leave their place of residence. There is a normal inability to leave the home and doing so requires considerable and taxing effort. Other absences are for medical reasons / religious services and are infrequent or of short duration when for other reasons). o If current dressing causes regression in wound condition, may D/C ordered dressing product/s and apply Normal Saline Moist Dressing daily until next [place] / Other MD appointment. [place] of regression in wound condition at [phone]. o Please direct any NON-WOUND related issues/requests for orders to patient's Primary Care Physician Wound #6 Left Calcaneus o [place] Visits - [place] Nurse may visit PRN to address patientos wound care needs. o FACE TO FACE ENCOUNTER: MEDICARE and MEDICAID PATIENTS: I certify that this patient is under my care and that I had a face-to-face encounter that meets the physician face-to-face encounter requirements with this patient on this date. The encounter with the patient was in whole or in part for the following MEDICAL CONDITION: (primary reason for [place]) MEDICAL NECESSITY: I certify, that based on my findings, NURSING  services are a medically necessary home health service. HOME BOUND STATUS: I certify that my [place], Sharon [name]. ([id number]) clinical findings support that this patient is homebound (i.e., Due to illness or injury, pt requires aid of supportive devices such as crutches, cane, wheelchairs, walkers, the use of special transportation or the assistance of another person to leave their place of residence. There is a normal inability to leave the home and doing so requires considerable and taxing effort. Other absences are for medical reasons / religious services and are infrequent or of short duration when for other reasons). o If current dressing causes regression in wound condition, may D/C ordered dressing product/s and apply Normal Saline Moist Dressing daily until next [place] / Other MD appointment. [place] of regression in wound condition at [phone]. o Please direct any NON-WOUND related issues/requests for orders to patient's Primary Care Physician Wound #7 Right Calcaneus o [place] Visits - [place] Nurse may visit PRN to address patientos wound care needs. o FACE TO FACE ENCOUNTER: MEDICARE and MEDICAID PATIENTS: I certify that this patient is under my care and that I had a face-to-face encounter that meets the physician face-to-face encounter requirements with this patient on this date. The encounter with the patient was in whole or in part for the following MEDICAL CONDITION: (primary reason for [place]) MEDICAL NECESSITY: I certify, that based on my findings, NURSING services are a medically necessary home health service. HOME BOUND STATUS: I certify that my clinical findings support that this patient is homebound (i.e., Due to illness or injury, pt requires aid of supportive devices such as crutches, cane, wheelchairs, walkers, the use of special transportation or the assistance of another person to leave  their place of residence. There  is a normal inability to leave the home and doing so requires considerable and taxing effort. Other absences are for medical reasons / religious services and are infrequent or of short duration when for other reasons). o If current dressing causes regression in wound condition, may D/C ordered dressing product/s and apply Normal Saline Moist Dressing daily until next [place] / Other MD appointment. [place] of regression in wound condition at [phone]. o Please direct any NON-WOUND related issues/requests for orders to patient's Primary Care Physician Electronic Signature(s) Signed: 04/13/2018 4:25:13 PM By: Montey Hora Signed: 04/13/2018 8:26:01 PM By: Worthy Keeler PA-C Entered By: Montey Hora on 04/13/2018 11:43:59 Sharon [name], Sharon [name]. ([id number]) -------------------------------------------------------------------------------- Problem List Details Patient Name: Sokolow, Yerania [name]. Date of Service: 04/13/2018 10:45 AM Medical Record Number: [id number] Patient Account Number: [id number] Date of Birth/Sex: 1933/06/04 (85 y.o. F) Treating RN: Montey Hora Primary Care Provider: Emily Filbert Other Clinician: Referring Provider: Emily Filbert Treating Provider/Extender: Melburn Hake, Howie Rufus Weeks in Treatment: 3 Active Problems ICD-10 Evaluated Encounter Code Description Active Date Today Diagnosis E11.621 Type 2 diabetes mellitus with foot ulcer 03/23/2018 No Yes I89.0 Lymphedema, not elsewhere classified 03/23/2018 No Yes L97.822 Non-pressure chronic ulcer of other part of left lower leg with 03/23/2018 No Yes fat layer exposed L97.812 Non-pressure chronic ulcer of other part of right lower leg 03/23/2018 No Yes with fat layer exposed L97.512 Non-pressure chronic ulcer of other part of right foot with fat 03/23/2018 No Yes layer exposed L97.522 Non-pressure chronic ulcer of other part of left foot with fat 03/23/2018 No  Yes layer exposed I73.89 Other specified peripheral vascular diseases 03/23/2018 No Yes Inactive Problems Resolved Problems Electronic Signature(s) Signed: 04/13/2018 8:26:01 PM By: Worthy Keeler PA-C Entered By: Worthy Keeler on 04/13/2018 11:32:43 Sharon [name], Sharon [name]. ([id number]) Sharon [name], Sharon [name] ([id number]) -------------------------------------------------------------------------------- Progress Note Details Patient Name: Milling, Myleigh [name]. Date of Service: 04/13/2018 10:45 AM Medical Record Number: [id number] Patient Account Number: [id number] Date of Birth/Sex: [birth year] (85 y.o. F) Treating RN: Montey Hora Primary Care Provider: Emily Filbert Other Clinician: Referring Provider: Emily Filbert Treating Provider/Extender: Melburn Hake, Anju Sereno Weeks in Treatment: 3 Subjective Chief Complaint Information obtained from Patient Multiple bilateral LE ulcers History of Present Illness (HPI) 03/23/18 On evaluation today patient presents for initial evaluation our clinic concerning issues that she's been having with the bilateral lower extremities. She has been out of the hospital for about a week and was there for about four days due to fluid overload. She does have congestive heart failure, atrial fibrillation, diabetes mellitus type II, and a right first toe amputation. Currently she is having bilateral lower extremity weeping and swelling. During her time in the hospital that she took her off of her diuretic medications according to what her son is telling me today. With that being said her primary care provider, Dr. Sabra Heck, has placed her back on some of these medications according to what her son is telling me. She has undergone previous testing including hemoglobin A1c of 7.6 on 01/11/18. She's also had arterial studies which show that her right ABI was 0.89 and the left ABI of 0.94 this obviously is good as well. Currently the biggest concern with both the patient and her son are  issues with overall general fluid overload and the fact that just wrapping her legs or managing the legs as we would do here in the clinic will not treat the overall fluid overload issue which I completely agree with. With that being  said I think that we definitely need to try to get these areas closed as quickly as possible in order to prevent any chance for infection/cellulitis. 03/30/18 on evaluation today patient actually appears to be doing much better in regard to her lower extremity ulcers. She's been tolerating the dressing changes at this point without complication overall I do not see any signs of anything worsening which is good news. Nonetheless I am hopeful that she will continue to make good improvement she still has a lot of drainage we may want to increase her compression wrap to something a little bit stronger. 04/13/18 on evaluation today patient appears to be doing very well in regard to her lower extremity ulcers. The hill ulcers appear to be the worst but there still making good improvement overall in my pinion. Fortunately there's no evidence of active infection at this time in her swelling seems to be well controlled with the compression wrap. Overall very pleased in this regard. Patient History Information obtained from Patient. Family History Hypertension - Child,Mother, Stroke - Father, No family history of Cancer, Diabetes, Kidney Disease, Lung Disease, Seizures, Thyroid Problems, Tuberculosis. Social History Former smoker - 30 years ago, Marital Status - Widowed, Alcohol Use - Never, Drug Use - No History, Caffeine Use - Rarely. Medical History Eyes Patient has history of Cataracts Denies history of Glaucoma, Optic Neuritis Ear/Nose/Mouth/Throat Denies history of Chronic sinus problems/congestion, Middle ear problems Hematologic/Lymphatic Patient has history of Anemia Sharon [name], Sharon [name]. ([id number]) Denies history of Hemophilia, Human Immunodeficiency Virus,  Lymphedema, Sickle Cell Disease Respiratory Denies history of Aspiration, Asthma, Chronic Obstructive Pulmonary Disease (COPD), Pneumothorax, Sleep Apnea, Tuberculosis Cardiovascular Patient has history of Arrhythmia - A. fib, Congestive Heart Failure, Hypertension Denies history of Angina, Hypotension, Myocardial Infarction, Peripheral Arterial Disease, Peripheral Venous Disease, Phlebitis, Vasculitis Gastrointestinal Denies history of Cirrhosis , Colitis, Crohn s, Hepatitis A, Hepatitis B, Hepatitis C Endocrine Patient has history of Type II Diabetes - oral medication Genitourinary Denies history of End Stage Renal Disease Immunological Denies history of Lupus Erythematosus, Raynaud s, Scleroderma Integumentary (Skin) Denies history of History of Burn, History of pressure wounds Musculoskeletal Patient has history of Rheumatoid Arthritis, Osteoarthritis Denies history of Gout, Osteomyelitis Neurologic Patient has history of Neuropathy Denies history of Dementia, Quadriplegia, Paraplegia, Seizure Disorder Oncologic Denies history of Received Chemotherapy, Received Radiation Psychiatric Patient has history of Anorexia/bulimia, Confinement Anxiety Hospitalization/Surgery History - 03/10/2018, [facility], Fluid retention. Review of Systems (ROS) Constitutional Symptoms (General Health) Denies complaints or symptoms of Fever, Chills. Respiratory The patient has no complaints or symptoms. Cardiovascular The patient has no complaints or symptoms. Psychiatric The patient has no complaints or symptoms. Objective Constitutional Well-nourished and well-hydrated in no acute distress. Vitals Time Taken: 11:12 AM, Height: 65 in, Weight: 155 lbs, BMI: 25.8, Temperature: 97.7 F, Pulse: 60 bpm, Respiratory Rate: 16 breaths/min, Blood Pressure: 126/60 mmHg. Respiratory Ahlgren, Gray [name]. ([id number]) normal breathing without difficulty. clear to auscultation  bilaterally. Cardiovascular regular rate and rhythm with normal S1, S2. Psychiatric this patient is able to make decisions and demonstrates good insight into disease process. Alert and Oriented x 3. pleasant and cooperative. General Notes: Patient's wound bed currently showed signs of good granulation at this time there does not appear to be any evidence of active infection currently. Fortunately there's no signs of significant inflammation either an overall I feel like the patient seems to be making good progress as far as her wounds are concerned no sharp debridement was required at any location today. Integumentary (  Hair, Skin) Wound #1 status is Open. Original cause of wound was Gradually Appeared. The wound is located on the Left,Dorsal Foot. The wound measures 1.5cm length x 1cm width x 0.1cm depth; 1.178cm^2 area and 0.118cm^3 volume. There is Fat Layer (Subcutaneous Tissue) Exposed exposed. There is no tunneling or undermining noted. There is a large amount of serous drainage noted. The wound margin is indistinct and nonvisible. There is small (1-33%) pink granulation within the wound bed. There is a large (67-100%) amount of necrotic tissue within the wound bed including Adherent Slough. The periwound skin appearance exhibited: Induration, Maceration, Rubor. The periwound skin appearance did not exhibit: Callus, Crepitus, Excoriation, Rash, Scarring, Dry/Scaly, Atrophie Blanche, Cyanosis, Ecchymosis, Hemosiderin Staining, Mottled, Pallor, Erythema. Periwound temperature was noted as No Abnormality. The periwound has tenderness on palpation. Wound #2 status is Open. Original cause of wound was Gradually Appeared. The wound is located on the Left,Dorsal Toe Second. The wound measures 0.8cm length x 1cm width x 0.1cm depth; 0.628cm^2 area and 0.063cm^3 volume. There is no tunneling or undermining noted. There is a none present amount of drainage noted. The wound margin is flat and  intact. There is large (67-100%) red granulation within the wound bed. There is a small (1-33%) amount of necrotic tissue within the wound bed including Eschar and Adherent Slough. The periwound skin appearance exhibited: Rubor. The periwound skin appearance did not exhibit: Callus, Crepitus, Excoriation, Induration, Rash, Scarring, Dry/Scaly, Maceration, Atrophie Blanche, Cyanosis, Ecchymosis, Hemosiderin Staining, Mottled, Pallor, Erythema. Periwound temperature was noted as No Abnormality. The periwound has tenderness on palpation. Wound #3 status is Open. Original cause of wound was Gradually Appeared. The wound is located on the Left,Anterior Lower Leg. The wound measures 1cm length x 1cm width x 0.1cm depth; 0.785cm^2 area and 0.079cm^3 volume. There is Fat Layer (Subcutaneous Tissue) Exposed exposed. There is no tunneling or undermining noted. There is a large amount of serous drainage noted. The wound margin is indistinct and nonvisible. There is medium (34-66%) pink granulation within the wound bed. There is a small (1-33%) amount of necrotic tissue within the wound bed including Adherent Slough. The periwound skin appearance exhibited: Excoriation, Maceration, Rubor. The periwound skin appearance did not exhibit: Callus, Crepitus, Induration, Rash, Scarring, Dry/Scaly, Atrophie Blanche, Cyanosis, Ecchymosis, Hemosiderin Staining, Mottled, Pallor, Erythema. Wound #4 status is Healed - Epithelialized. Original cause of wound was Gradually Appeared. The wound is located on the Right,Anterior Lower Leg. The wound measures 0cm length x 0cm width x 0cm depth; 0cm^2 area and 0cm^3 volume. The wound is limited to skin breakdown. There is no tunneling or undermining noted. There is a none present amount of drainage noted. The wound margin is indistinct and nonvisible. There is small (1-33%) pink granulation within the wound bed. There is no necrotic tissue within the wound bed. The periwound skin  appearance exhibited: Maceration, Rubor. The periwound skin appearance did not exhibit: Callus, Crepitus, Excoriation, Induration, Rash, Scarring, Dry/Scaly, Atrophie Blanche, Cyanosis, Ecchymosis, Hemosiderin Staining, Mottled, Pallor, Erythema. Periwound temperature was noted as No Abnormality. The periwound has tenderness on palpation. Wound #5 status is Healed - Epithelialized. Original cause of wound was Gradually Appeared. The wound is located on the Right,Lateral Lower Leg. The wound measures 0cm length x 0cm width x 0cm depth; 0cm^2 area and 0cm^3 volume. There is no tunneling or undermining noted. There is a large amount of serous drainage noted. The wound margin is indistinct and nonvisible. There is small (1-33%) pink granulation within the wound bed. There is  no necrotic tissue within the wound bed. The periwound skin appearance exhibited: Maceration, Rubor. The periwound skin appearance did not exhibit: Callus, Crepitus, Excoriation, Induration, Rash, Scarring, Dry/Scaly, Atrophie Blanche, Cyanosis, Ecchymosis, Hemosiderin Staining, Mottled, Pallor, Erythema. Periwound temperature was noted as No Abnormality. The periwound has tenderness on palpation. Sharon [name], Sharon [name]. ([id number]) Wound #6 status is Open. Original cause of wound was Gradually Appeared. The wound is located on the Left Calcaneus. The wound measures 1cm length x 1cm width x 0.2cm depth; 0.785cm^2 area and 0.157cm^3 volume. There is Fat Layer (Subcutaneous Tissue) Exposed exposed. There is no tunneling or undermining noted. There is a large amount of serous drainage noted. The wound margin is flat and intact. There is small (1-33%) pink granulation within the wound bed. There is a large (67-100%) amount of necrotic tissue within the wound bed including Eschar and Adherent Slough. The periwound skin appearance exhibited: Maceration. The periwound skin appearance did not exhibit: Callus, Crepitus, Excoriation,  Induration, Rash, Scarring, Dry/Scaly, Atrophie Blanche, Cyanosis, Ecchymosis, Hemosiderin Staining, Mottled, Pallor, Rubor, Erythema. Periwound temperature was noted as No Abnormality. The periwound has tenderness on palpation. Wound #7 status is Open. Original cause of wound was Gradually Appeared. The wound is located on the Right Calcaneus. The wound measures 3.5cm length x 6cm width x 0.1cm depth; 16.493cm^2 area and 1.649cm^3 volume. There is no tunneling or undermining noted. There is a none present amount of drainage noted. The wound margin is distinct with the outline attached to the wound base. There is no granulation within the wound bed. There is a medium (34-66%) amount of necrotic tissue within the wound bed including Eschar. The periwound skin appearance exhibited: Callus. The periwound skin appearance did not exhibit: Crepitus, Excoriation, Induration, Rash, Scarring, Dry/Scaly, Maceration, Atrophie Blanche, Cyanosis, Ecchymosis, Hemosiderin Staining, Mottled, Pallor, Rubor, Erythema. Periwound temperature was noted as No Abnormality. Assessment Active Problems ICD-10 Type 2 diabetes mellitus with foot ulcer Lymphedema, not elsewhere classified Non-pressure chronic ulcer of other part of left lower leg with fat layer exposed Non-pressure chronic ulcer of other part of right lower leg with fat layer exposed Non-pressure chronic ulcer of other part of right foot with fat layer exposed Non-pressure chronic ulcer of other part of left foot with fat layer exposed Other specified peripheral vascular diseases Procedures Wound #1 Pre-procedure diagnosis of Wound #1 is a Diabetic Wound/Ulcer of the Lower Extremity located on the Left,Dorsal Foot . There was a Four Layer Compression Therapy Procedure with a pre-treatment ABI of 0.9 by Montey Hora, RN. Post procedure Diagnosis Wound #1: Same as Pre-Procedure Wound #2 Pre-procedure diagnosis of Wound #2 is a Diabetic Wound/Ulcer of  the Lower Extremity located on the Left,Dorsal Toe Second . There was a Four Layer Compression Therapy Procedure with a pre-treatment ABI of 0.9 by Montey Hora, RN. Post procedure Diagnosis Wound #2: Same as Pre-Procedure Wound #3 Pre-procedure diagnosis of Wound #3 is a Lymphedema located on the Left,Anterior Lower Leg . There was a Four Layer Compression Therapy Procedure with a pre-treatment ABI of 0.9 by Montey Hora, RN. Post procedure Diagnosis Wound #3: Same as Pre-Procedure Sharon [name], Sharon [name]. ([id number]) Wound #6 Pre-procedure diagnosis of Wound #6 is a Pressure Ulcer located on the Left Calcaneus . There was a Four Layer Compression Therapy Procedure with a pre-treatment ABI of 0.9 by Montey Hora, RN. Post procedure Diagnosis Wound #6: Same as Pre-Procedure Wound #7 Pre-procedure diagnosis of Wound #7 is a Diabetic Wound/Ulcer of the Lower Extremity located on the Right Calcaneus .  There was a Four Layer Compression Therapy Procedure with a pre-treatment ABI of 0.9 by Montey Hora, RN. Post procedure Diagnosis Wound #7: Same as Pre-Procedure Plan Wound Cleansing: Wound #1 Left,Dorsal Foot: Cleanse wound with mild soap and water Wound #2 Left,Dorsal Toe Second: Cleanse wound with mild soap and water Wound #3 Left,Anterior Lower Leg: Cleanse wound with mild soap and water Wound #6 Left Calcaneus: Cleanse wound with mild soap and water Wound #7 Right Calcaneus: Cleanse wound with mild soap and water Primary Wound Dressing: Wound #1 Left,Dorsal Foot: Silver Alginate Wound #2 Left,Dorsal Toe Second: Silver Alginate Wound #3 Left,Anterior Lower Leg: Silver Alginate Wound #6 Left Calcaneus: Silver Alginate Wound #7 Right Calcaneus: Other: - paint with betadine and dry gauze over Secondary Dressing: Wound #1 Left,Dorsal Foot: ABD pad Wound #2 Left,Dorsal Toe Second: ABD pad Wound #3 Left,Anterior Lower Leg: ABD pad Wound #6 Left Calcaneus: ABD pad Wound #7  Right Calcaneus: ABD pad Dressing Change Frequency: Wound #1 Left,Dorsal Foot: Other: - twice weekly - Tuesdays and Fridays Wound #2 Left,Dorsal Toe Second: Other: - twice weekly - Tuesdays and Fridays Wound #3 Left,Anterior Lower Leg: Sharon [name], Sharon [name]. ([id number]) Other: - twice weekly - Tuesdays and Fridays Wound #6 Left Calcaneus: Other: - twice weekly - Tuesdays and Fridays Wound #7 Right Calcaneus: Other: - twice weekly - Tuesdays and Fridays Follow-up Appointments: Return Appointment in 2 weeks. - HHRN to visit patient on Tuesdays and Fridays when patient does not come into [facility] and Fridays when she does come into clinic Edema Control: Wound #1 Left,Dorsal Foot: 4 Layer Compression System - Bilateral - Please wrap from toes to 3cm below the knee Wound #2 Left,Dorsal Toe Second: 4 Layer Compression System - Bilateral - Please wrap from toes to 3cm below the knee Wound #3 Left,Anterior Lower Leg: 4 Layer Compression System - Bilateral - Please wrap from toes to 3cm below the knee Wound #6 Left Calcaneus: 4 Layer Compression System - Bilateral - Please wrap from toes to 3cm below the knee Wound #7 Right Calcaneus: 4 Layer Compression System - Bilateral - Please wrap from toes to 3cm below the knee Home Health: Wound #1 Left,Dorsal Foot: Continue Home Health Visits - [facility] Nurse may visit PRN to address patient s wound care needs. FACE TO FACE ENCOUNTER: MEDICARE and MEDICAID PATIENTS: I certify that this patient is under my care and that I had a face-to-face encounter that meets the physician face-to-face encounter requirements with this patient on this date. The encounter with the patient was in whole or in part for the following MEDICAL CONDITION: (primary reason for [place]) MEDICAL NECESSITY: I certify, that based on my findings, NURSING services are a medically necessary home health service. HOME BOUND STATUS: I certify that my  clinical findings support that this patient is homebound (i.e., Due to illness or injury, pt requires aid of supportive devices such as crutches, cane, wheelchairs, walkers, the use of special transportation or the assistance of another person to leave their place of residence. There is a normal inability to leave the home and doing so requires considerable and taxing effort. Other absences are for medical reasons / religious services and are infrequent or of short duration when for other reasons). If current dressing causes regression in wound condition, may D/C ordered dressing product/s and apply Normal Saline Moist Dressing daily until next [place] / Other MD appointment. [place] of regression in wound condition at [phone]. Please  direct any NON-WOUND related issues/requests for orders to patient's Primary Care Physician Wound #2 Left,Dorsal Toe Second: [place] Visits - [facility] Nurse may visit PRN to address patient s wound care needs. FACE TO FACE ENCOUNTER: MEDICARE and MEDICAID PATIENTS: I certify that this patient is under my care and that I had a face-to-face encounter that meets the physician face-to-face encounter requirements with this patient on this date. The encounter with the patient was in whole or in part for the following MEDICAL CONDITION: (primary reason for [place]) MEDICAL NECESSITY: I certify, that based on my findings, NURSING services are a medically necessary home health service. HOME BOUND STATUS: I certify that my clinical findings support that this patient is homebound (i.e., Due to illness or injury, pt requires aid of supportive devices such as crutches, cane, wheelchairs, walkers, the use of special transportation or the assistance of another person to leave their place of residence. There is a normal inability to leave the home and doing so requires considerable and taxing effort. Other  absences are for medical reasons / religious services and are infrequent or of short duration when for other reasons). If current dressing causes regression in wound condition, may D/C ordered dressing product/s and apply Normal Saline Moist Dressing daily until next [place] / Other MD appointment. [place] of regression in wound condition at [phone]. Please direct any NON-WOUND related issues/requests for orders to patient's Primary Care Physician Wound #3 Left,Anterior Lower Leg: [place] Visits - [facility] Nurse may visit PRN to address patient s wound care needs. FACE TO FACE ENCOUNTER: MEDICARE and MEDICAID PATIENTS: I certify that this patient is under my care and that I had a face-to-face encounter that meets the physician face-to-face encounter requirements with this patient on this date. The encounter with the patient was in whole or in part for the following MEDICAL CONDITION: (primary reason for [place]) MEDICAL NECESSITY: I certify, that based on my findings, NURSING services are a medically necessary home health service. HOME BOUND STATUS: I certify that my clinical findings support that this patient is homebound (i.e., Due to CUMA, POLYAKOV. ([id number]) illness or injury, pt requires aid of supportive devices such as crutches, cane, wheelchairs, walkers, the use of special transportation or the assistance of another person to leave their place of residence. There is a normal inability to leave the home and doing so requires considerable and taxing effort. Other absences are for medical reasons / religious services and are infrequent or of short duration when for other reasons). If current dressing causes regression in wound condition, may D/C ordered dressing product/s and apply Normal Saline Moist Dressing daily until next [place] / Other MD appointment. [place] of regression  in wound condition at [phone]. Please direct any NON-WOUND related issues/requests for orders to patient's Primary Care Physician Wound #6 Left Calcaneus: [place] Visits - [facility] Nurse may visit PRN to address patient s wound care needs. FACE TO FACE ENCOUNTER: MEDICARE and MEDICAID PATIENTS: I certify that this patient is under my care and that I had a face-to-face encounter that meets the physician face-to-face encounter requirements with this patient on this date. The encounter with the patient was in whole or in part for the following MEDICAL CONDITION: (primary reason for [place]) MEDICAL NECESSITY: I certify, that based on my findings, NURSING services are a medically necessary home health service. HOME BOUND STATUS:  I certify that my clinical findings support that this patient is homebound (i.e., Due to illness or injury, pt requires aid of supportive devices such as crutches, cane, wheelchairs, walkers, the use of special transportation or the assistance of another person to leave their place of residence. There is a normal inability to leave the home and doing so requires considerable and taxing effort. Other absences are for medical reasons / religious services and are infrequent or of short duration when for other reasons). If current dressing causes regression in wound condition, may D/C ordered dressing product/s and apply Normal Saline Moist Dressing daily until next [place] / Other MD appointment. [place] of regression in wound condition at [phone]. Please direct any NON-WOUND related issues/requests for orders to patient's Primary Care Physician Wound #7 Right Calcaneus: [place] Visits - [facility] Nurse may visit PRN to address patient s wound care needs. FACE TO FACE ENCOUNTER: MEDICARE and MEDICAID PATIENTS: I certify that this patient is under my care and that I had a  face-to-face encounter that meets the physician face-to-face encounter requirements with this patient on this date. The encounter with the patient was in whole or in part for the following MEDICAL CONDITION: (primary reason for [place]) MEDICAL NECESSITY: I certify, that based on my findings, NURSING services are a medically necessary home health service. HOME BOUND STATUS: I certify that my clinical findings support that this patient is homebound (i.e., Due to illness or injury, pt requires aid of supportive devices such as crutches, cane, wheelchairs, walkers, the use of special transportation or the assistance of another person to leave their place of residence. There is a normal inability to leave the home and doing so requires considerable and taxing effort. Other absences are for medical reasons / religious services and are infrequent or of short duration when for other reasons). If current dressing causes regression in wound condition, may D/C ordered dressing product/s and apply Normal Saline Moist Dressing daily until next [place] / Other MD appointment. [place] of regression in wound condition at [phone]. Please direct any NON-WOUND related issues/requests for orders to patient's Primary Care Physician I'm in a recommend that we continue with the above wound care measures for the next week and the patient is in agreement with plan. Overall I'm happy with how things have been going and I think that we would want to continue down this road unless something changes to indicate that we need to switch it off. The patient and her son are in agreement with plan. Otherwise will see her back for reevaluation in two weeks time. Please see above for specific wound care orders. We will see patient for re-evaluation in 2 week(s) here in the clinic. If anything worsens or changes patient will contact our office for additional recommendations. Electronic  Signature(s) Signed: 04/13/2018 8:26:01 PM By: Worthy Keeler PA-C Entered By: Worthy Keeler on 04/13/2018 12:24:10 Veloso, Sharon [name] ([id number]) -------------------------------------------------------------------------------- ROS/PFSH Details Patient Name: Rymer, Mame [name]. Date of Service: 04/13/2018 10:45 AM Medical Record Number: [id number] Patient Account Number: [id number] Date of Birth/Sex: [birth year] (85 y.o. F) Treating RN: Montey Hora Primary Care Provider: Emily Filbert Other Clinician: Referring Provider: Emily Filbert Treating Provider/Extender: Melburn Hake, Lyllie Cobbins Weeks in Treatment: 3 Information Obtained From Patient Wound History Do you currently have one or more open woundso Yes Approximately how long have you had your woundso 3 weeks Has your wound(s) ever healed and then  re-openedo No Have you had any lab work done in the past montho Yes Who ordered the lab work [employer] Have you tested positive for an antibiotic resistant organism (MRSA, VRE)o Yes Date: 03/10/2018 Have you tested positive for osteomyelitis (bone infection)o No Have you had any tests for circulation on your legso Yes Who ordered the testo [facility] Where was the test doneo 01/13/2017 Constitutional Symptoms (General Health) Complaints and Symptoms: Negative for: Fever; Chills Eyes Medical History: Positive for: Cataracts Negative for: Glaucoma; Optic Neuritis Ear/Nose/Mouth/Throat Medical History: Negative for: Chronic sinus problems/congestion; Middle ear problems Hematologic/Lymphatic Medical History: Positive for: Anemia Negative for: Hemophilia; Human Immunodeficiency Virus; Lymphedema; Sickle Cell Disease Respiratory Complaints and Symptoms: No Complaints or Symptoms Medical History: Negative for: Aspiration; Asthma; Chronic Obstructive Pulmonary Disease (COPD); Pneumothorax; Sleep Apnea; Tuberculosis Cardiovascular Robello, Cheryln [name].  ([id number]) Complaints and Symptoms: No Complaints or Symptoms Medical History: Positive for: Arrhythmia - A. fib; Congestive Heart Failure; Hypertension Negative for: Angina; Hypotension; Myocardial Infarction; Peripheral Arterial Disease; Peripheral Venous Disease; Phlebitis; Vasculitis Gastrointestinal Medical History: Negative for: Cirrhosis ; Colitis; Crohnos; Hepatitis A; Hepatitis B; Hepatitis C Endocrine Medical History: Positive for: Type II Diabetes - oral medication Time with diabetes: 15 years Treated with: Oral agents Blood sugar tested every day: No Genitourinary Medical History: Negative for: End Stage Renal Disease Immunological Medical History: Negative for: Lupus Erythematosus; Raynaudos; Scleroderma Integumentary (Skin) Medical History: Negative for: History of Burn; History of pressure wounds Musculoskeletal Medical History: Positive for: Rheumatoid Arthritis; Osteoarthritis Negative for: Gout; Osteomyelitis Neurologic Medical History: Positive for: Neuropathy Negative for: Dementia; Quadriplegia; Paraplegia; Seizure Disorder Oncologic Medical History: Negative for: Received Chemotherapy; Received Radiation Psychiatric Complaints and Symptoms: No Complaints or Symptoms Medical History: TINEA, NOBILE. ([id number]) Positive for: Anorexia/bulimia; Confinement Anxiety HBO Extended History Items Eyes: Cataracts Immunizations Pneumococcal Vaccine: Received Pneumococcal Vaccination: Yes Implantable Devices Yes Hospitalization / Surgery History Name of Hospital Purpose of Hospitalization/Surgery Date [facility] Fluid retention 03/10/2018 Family and Social History Cancer: No; Diabetes: No; Hypertension: Yes - Child,Mother; Kidney Disease: No; Lung Disease: No; Seizures: No; Stroke: Yes - Father; Thyroid Problems: No; Tuberculosis: No; Former smoker - 30 years ago; Marital Status - Widowed; Alcohol Use: Never; Drug Use: No History; Caffeine  Use: Rarely; Financial Concerns: No; Food, Clothing or Shelter Needs: No; Support System Lacking: No; Transportation Concerns: No; Advanced Directives: No; Patient does not want information on Advanced Directives; Living Will: Yes (Not Provided); Medical Power of Attorney: Yes - Marshal (Son) (Not Provided) Physician Affirmation I have reviewed and agree with the above information. Electronic Signature(s) Signed: 04/13/2018 4:25:13 PM By: Montey Hora Signed: 04/13/2018 8:26:01 PM By: Worthy Keeler PA-C Entered By: Worthy Keeler on 04/13/2018 12:23:13 Savidge, Mikesha [name]. ([id number]) -------------------------------------------------------------------------------- SuperBill Details Patient Name: [place]uka, Breonna [name]. Date of Service: 04/13/2018 Medical Record Number: [id number] Patient Account Number: [id number] Date of Birth/Sex: Oct 16, 1933 (85 y.o. F) Treating RN: Montey Hora Primary Care Provider: Emily Filbert Other Clinician: Referring Provider: Emily Filbert Treating Provider/Extender: Melburn Hake, [place] Weeks in Treatment: 3 Diagnosis Coding ICD-10 Codes Code Description E11.621 Type 2 diabetes mellitus with foot ulcer I89.0 Lymphedema, not elsewhere classified L97.822 Non-pressure chronic ulcer of other part of left lower leg with fat layer exposed L97.812 Non-pressure chronic ulcer of other part of right lower leg with fat layer exposed L97.512 Non-pressure chronic ulcer of other part of right foot with fat layer exposed L97.522 Non-pressure chronic ulcer of other part of left foot with fat layer exposed I73.89 Other specified  peripheral vascular diseases Facility Procedures CPT4: Description Modifier Quantity Code [id number] BILATERAL: Application of multi-layer venous compression system; leg (below 1 knee), including ankle and foot. Physician Procedures CPT4 Code Description: [id number] - WC PHYS LEVEL 4 - EST PT ICD-10 Diagnosis Description E11.621 Type 2 diabetes  mellitus with foot ulcer I89.0 Lymphedema, not elsewhere classified L97.822 Non-pressure chronic ulcer of other part of left lower leg  with L97.812 Non-pressure chronic ulcer of other part of right lower leg with Modifier: fat layer expose fat layer expos Quantity: 1 d ed Electronic Signature(s) Signed: 04/13/2018 8:26:01 PM By: Worthy Keeler PA-C Entered By: Worthy Keeler on 04/13/2018 12:53:[id number]

## 2018-04-14 NOTE — Progress Notes (Signed)
REBBECCA, OSUNA ([id number]) Visit Report for 04/13/2018 Arrival Information Details Patient Name: MORGIN, HALLS. Date of Service: 04/13/2018 10:45 AM Medical Record Number: [id number] Patient Account Number: [id number] Date of Birth/Sex: [birth year] (85 y.o. F) Treating RN: Army Melia Primary Care Leaha Cuervo: Emily Filbert Other Clinician: Referring Loyd Salvador: Emily Filbert Treating Eurika Sandy/Extender: Melburn Hake, HOYT Weeks in Treatment: 3 Visit Information History Since Last Visit Added or deleted any medications: No Patient Arrived: Wheel Chair Any new allergies or adverse reactions: No Arrival Time: 11:11 Had a fall or experienced change in No Accompanied By: son activities of daily living that may affect Transfer Assistance: None risk of falls: Patient Has Alerts: Yes Signs or symptoms of abuse/neglect since last visito No Patient Alerts: ABI 06/29/17 L .94 R .89 Hospitalized since last visit: No Has Dressing in Place as Prescribed: Yes Pain Present Now: No Electronic Signature(s) Signed: 04/13/2018 4:25:13 PM By: Montey Hora Entered By: Montey Hora on 04/13/2018 11:40:49 Wojdyla, Raegen P. ([id number]) -------------------------------------------------------------------------------- Compression Therapy Details Patient Name: Meissner, Debroh P. Date of Service: 04/13/2018 10:45 AM Medical Record Number: [id number] Patient Account Number: [id number] Date of Birth/Sex: [birth year] (85 y.o. F) Treating RN: Montey Hora Primary Care Jancarlo Biermann: Emily Filbert Other Clinician: Referring Graceland Wachter: Emily Filbert Treating Cain Fitzhenry/Extender: Melburn Hake, HOYT Weeks in Treatment: 3 Compression Therapy Performed for Wound Assessment: Wound #1 Left,Dorsal Foot Performed By: Clinician Montey Hora, RN Compression Type: Four Layer Pre Treatment ABI: 0.9 Post Procedure Diagnosis Same as Pre-procedure Electronic Signature(s) Signed: 04/13/2018 4:25:13 PM By: Montey Hora Entered By:  Montey Hora on 04/13/2018 11:40:24 Sudano, Noretta P. ([id number]) -------------------------------------------------------------------------------- Compression Therapy Details Patient Name: Welcome, Shaylan P. Date of Service: 04/13/2018 10:45 AM Medical Record Number: [id number] Patient Account Number: [id number] Date of Birth/Sex: March 17, 1933 (85 y.o. F) Treating RN: Montey Hora Primary Care Amandajo Gonder: Emily Filbert Other Clinician: Referring Marsa Matteo: Emily Filbert Treating Nathanyel Defenbaugh/Extender: Melburn Hake, HOYT Weeks in Treatment: 3 Compression Therapy Performed for Wound Assessment: Wound #2 Left,Dorsal Toe Second Performed By: Clinician Montey Hora, RN Compression Type: Four Layer Pre Treatment ABI: 0.9 Post Procedure Diagnosis Same as Pre-procedure Electronic Signature(s) Signed: 04/13/2018 4:25:13 PM By: Montey Hora Entered By: Montey Hora on 04/13/2018 11:40:24 Kuan, Chundra P. ([id number]) -------------------------------------------------------------------------------- Compression Therapy Details Patient Name: Bednarski, Aleyna P. Date of Service: 04/13/2018 10:45 AM Medical Record Number: [id number] Patient Account Number: [id number] Date of Birth/Sex: 10-15-1933 (85 y.o. F) Treating RN: Montey Hora Primary Care Karliah Kowalchuk: Emily Filbert Other Clinician: Referring Hessie Varone: Emily Filbert Treating Brinly Maietta/Extender: Melburn Hake, HOYT Weeks in Treatment: 3 Compression Therapy Performed for Wound Assessment: Wound #3 Left,Anterior Lower Leg Performed By: Clinician Montey Hora, RN Compression Type: Four Layer Pre Treatment ABI: 0.9 Post Procedure Diagnosis Same as Pre-procedure Electronic Signature(s) Signed: 04/13/2018 4:25:13 PM By: Montey Hora Entered By: Montey Hora on 04/13/2018 11:40:24 Paolucci, Esmerelda P. ([id number]) -------------------------------------------------------------------------------- Compression Therapy Details Patient Name: Caddell, Toshi  P. Date of Service: 04/13/2018 10:45 AM Medical Record Number: [id number] Patient Account Number: [id number] Date of Birth/Sex: [birth year] (85 y.o. F) Treating RN: Montey Hora Primary Care Zay Yeargan: Emily Filbert Other Clinician: Referring Tyreak Reagle: Emily Filbert Treating Arthea Nobel/Extender: Melburn Hake, HOYT Weeks in Treatment: 3 Compression Therapy Performed for Wound Assessment: Wound #6 Left Calcaneus Performed By: Clinician Montey Hora, RN Compression Type: Four Layer Pre Treatment ABI: 0.9 Post Procedure Diagnosis Same as Pre-procedure Electronic Signature(s) Signed: 04/13/2018 4:25:13 PM By: Montey Hora Entered By: Montey Hora on 04/13/2018 11:40:24 Celia, Denina P. ([id number]) -------------------------------------------------------------------------------- Compression Therapy Details Patient Name: Lebleu, Alverta P. Date of Service:  04/13/2018 10:45 AM Medical Record Number: [id number] Patient Account Number: [id number] Date of Birth/Sex: 01-16-1933 (84 y.o. F) Treating RN: Montey Hora Primary Care Chason Mciver: Emily Filbert Other Clinician: Referring Viktoria Gruetzmacher: Emily Filbert Treating Lennis Korb/Extender: Melburn Hake, HOYT Weeks in Treatment: 3 Compression Therapy Performed for Wound Assessment: Wound #7 Right Calcaneus Performed By: Clinician Montey Hora, RN Compression Type: Four Layer Pre Treatment ABI: 0.9 Post Procedure Diagnosis Same as Pre-procedure Electronic Signature(s) Signed: 04/13/2018 4:25:13 PM By: Montey Hora Entered By: Montey Hora on 04/13/2018 11:40:24 Leppert, [place] ([id number]) -------------------------------------------------------------------------------- Encounter Discharge Information Details Patient Name: Romero, Rayla P. Date of Service: 04/13/2018 10:45 AM Medical Record Number: [id number] Patient Account Number: [id number] Date of Birth/Sex: 06/22/1933 (85 y.o. F) Treating RN: Montey Hora Primary Care Natsha Guidry: Emily Filbert  Other Clinician: Referring Guillermo Nehring: Emily Filbert Treating Jumana Paccione/Extender: Melburn Hake, HOYT Weeks in Treatment: 3 Encounter Discharge Information Items Discharge Condition: Stable Ambulatory Status: Wheelchair Discharge Destination: Home Transportation: Private Auto Accompanied By: son Schedule Follow-up Appointment: Yes Clinical Summary of Care: Electronic Signature(s) Signed: 04/13/2018 4:25:13 PM By: Montey Hora Entered By: Montey Hora on 04/13/2018 12:18:42 Fout, Jabree P. ([id number]) -------------------------------------------------------------------------------- Multi Wound Chart Details Patient Name: Cryder, Jamilett P. Date of Service: 04/13/2018 10:45 AM Medical Record Number: [id number] Patient Account Number: [id number] Date of Birth/Sex: 01-14-1934 (85 y.o. 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 Secondary Etiology: N/A N/A N/A Comorbid History: Cataracts, Anemia, Cataracts, Anemia, Cataracts, Anemia, Arrhythmia, Congestive Heart Arrhythmia, Congestive Heart Arrhythmia, Congestive Heart Failure, Hypertension, Type II Failure, Hypertension, Type II Failure, Hypertension, Type II Diabetes, Rheumatoid Diabetes, Rheumatoid Diabetes, Rheumatoid Arthritis, Osteoarthritis, Arthritis, Osteoarthritis, Arthritis, Osteoarthritis, Neuropathy, Anorexia/bulimia, Neuropathy,  Anorexia/bulimia, Neuropathy, Anorexia/bulimia, Confinement Anxiety Confinement Anxiety Confinement Anxiety Date Acquired: 03/09/2018 03/09/2018 03/09/2018 Weeks of Treatment: 3 3 3  Wound Status: Open Open Open Clustered Wound: Yes No Yes Clustered Quantity: N/A N/A 2 Measurements L x W x D 1.5x1x0.1 0.8x1x0.1 1x1x0.1 (cm) Area (cm) : 1.178 0.628 0.785 Volume (cm) : 0.118 0.063 0.079 % Reduction in Area: 95.80% -185.50% 99.30% % Reduction in Volume: 95.80% -186.40% 99.30% Classification: Grade 2 Unable to visualize wound bed Full Thickness Without Exposed Support Structures Exudate Amount: Large None Present Large Exudate Type: Serous N/A Serous Exudate Color: amber N/A amber Wound Margin: Indistinct, nonvisible Flat and Intact Indistinct, nonvisible Santo, Sabirin P. ([id number]) Granulation Amount: Small (1-33%) Large (67-100%) Medium (34-66%) Granulation Quality: Pink Red Pink Necrotic Amount: Large (67-100%) Small (1-33%) Small (1-33%) Necrotic Tissue: Adherent Slough Eschar, [place] Exposed Structures: Fat Layer (Subcutaneous Fascia: No Fat Layer (Subcutaneous Tissue) Exposed: Yes Fat Layer (Subcutaneous Tissue) Exposed: Yes Fascia: No Tissue) Exposed: No Fascia: No Tendon: No Tendon: No Tendon: No Muscle: No Muscle: No Muscle: No Joint: No Joint: No Joint: No Bone: No Bone: No Bone: No Epithelialization: Large (67-100%) None None Periwound Skin Texture: Induration: Yes Excoriation: No Excoriation: Yes Excoriation: No Induration: No Induration: No Callus: No Callus: No Callus: No Crepitus: No Crepitus: No Crepitus: No Rash: No Rash: No Rash: No Scarring: No Scarring: No Scarring: No Periwound  Skin Moisture: Maceration: Yes Maceration: No Maceration: Yes Dry/Scaly: No Dry/Scaly: No Dry/Scaly: No Periwound Skin Color: Rubor: Yes Rubor: Yes Rubor: Yes Atrophie Blanche: No Atrophie Blanche: No Atrophie Blanche:  No Cyanosis: No Cyanosis: No Cyanosis: No Ecchymosis: No Ecchymosis: No Ecchymosis: No Erythema: No Erythema: No Erythema: No Hemosiderin Staining: No Hemosiderin Staining: No Hemosiderin Staining: No Mottled: No Mottled: No Mottled: No Pallor: No Pallor: No Pallor: No Temperature: No Abnormality No Abnormality N/A Tenderness on Palpation: Yes Yes No Wound Preparation: Ulcer Cleansing: Ulcer Cleansing: Ulcer Cleansing: Rinsed/Irrigated with Saline Rinsed/Irrigated with Saline Rinsed/Irrigated with Saline Topical Anesthetic Applied: Topical Anesthetic Applied: Topical Anesthetic Applied: Other: lidocaine 4% None, Other: lidocaine 4% Other: lidocaine 4% Wound Number: [id number] 5 6  Photos: Wound Location: Right, Anterior Lower Leg Right, Lateral Lower Leg Left Calcaneus Wounding Event: Gradually Appeared Gradually Appeared Gradually Appeared Primary Etiology: Lymphedema Lymphedema Pressure Ulcer Secondary Etiology: N/A N/A N/A Comorbid History: Cataracts, Anemia, Cataracts, Anemia, Cataracts, Anemia, Arrhythmia, Congestive Heart Arrhythmia, Congestive Heart Arrhythmia, Congestive Heart Failure, Hypertension, Type II Failure, Hypertension, Type II Failure, Hypertension, Type II Diabetes, Rheumatoid Diabetes, Rheumatoid Diabetes, Rheumatoid Arthritis, Osteoarthritis, Arthritis, Osteoarthritis, Arthritis, Osteoarthritis, Neuropathy, Anorexia/bulimia, Neuropathy, Anorexia/bulimia, Neuropathy, Anorexia/bulimia, Confinement Anxiety Confinement Anxiety Confinement Anxiety Date Acquired: 03/09/2018 03/09/2018 03/08/2018 Weeks of Treatment: 3 3 3  Joslin, Bathsheba P. ([id number]) Wound Status: Healed - Epithelialized Healed - Epithelialized Open Clustered Wound: No Yes No Clustered Quantity: N/A N/A N/A Measurements L x W x D 0x0x0 0x0x0 1x1x0.2 (cm) Area (cm) : 0 0 0.785 Volume (cm) : 0 0 0.157 % Reduction in Area: 100.00% 100.00% 24.30% % Reduction in Volume: 100.00% 100.00%  24.20% Classification: Partial Thickness Partial Thickness Category/Stage II Exudate Amount: None Present Large Large Exudate Type: N/A Serous Serous Exudate Color: N/A amber amber Wound Margin: Indistinct, nonvisible Indistinct, nonvisible Flat and Intact Granulation Amount: Small (1-33%) Small (1-33%) Small (1-33%) Granulation Quality: Pink Pink Pink Necrotic Amount: None Present (0%) None Present (0%) Large (67-100%) Necrotic Tissue: N/A N/A Eschar, Adherent Slough Exposed Structures: Fascia: No Fascia: No Fat Layer (Subcutaneous Fat Layer (Subcutaneous Fat Layer (Subcutaneous Tissue) Exposed: Yes Tissue) Exposed: No Tissue) Exposed: No Fascia: No Tendon: No Tendon: No Tendon: No Muscle: No Muscle: No Muscle: No Joint: No Joint: No Joint: No Bone: No Bone: No Bone: No Limited to Skin Breakdown Epithelialization: Large (67-100%) Large (67-100%) None Periwound Skin Texture: Excoriation: No Excoriation: No Excoriation: No Induration: No Induration: No Induration: No Callus: No Callus: No Callus: No Crepitus: No Crepitus: No Crepitus: No Rash: No Rash: No Rash: No Scarring: No Scarring: No Scarring: No Periwound Skin Moisture: Maceration: Yes Maceration: Yes Maceration: Yes Dry/Scaly: No Dry/Scaly: No Dry/Scaly: No Periwound Skin Color: Rubor: Yes Rubor: Yes Atrophie Blanche: No Atrophie Blanche: No Atrophie Blanche: No Cyanosis: No Cyanosis: No Cyanosis: No Ecchymosis: No Ecchymosis: No Ecchymosis: No Erythema: No Erythema: No Erythema: No Hemosiderin Staining: No Hemosiderin Staining: No Hemosiderin Staining: No Mottled: No Mottled: No Mottled: No Pallor: No Pallor: No Pallor: No Rubor: No Temperature: No Abnormality No Abnormality No Abnormality Tenderness on Palpation: Yes Yes Yes Wound Preparation: Ulcer Cleansing: Ulcer Cleansing: Ulcer Cleansing: Rinsed/Irrigated with Saline Rinsed/Irrigated with Saline Rinsed/Irrigated with  Saline Topical Anesthetic Applied: Topical Anesthetic Applied: Topical Anesthetic Applied: Other: lidocaine 4% Other: lidocaine 4% Other: lidocaine 4% Wound Number: 7 N/A N/A Photos: N/A N/A Kidney, Lashandra P. ([id number]) Wound Location: Right Calcaneus N/A N/A Wounding Event: Gradually Appeared N/A N/A Primary Etiology: Diabetic Wound/Ulcer of the N/A N/A Lower Extremity Secondary Etiology: Pressure  Ulcer N/A N/A Comorbid History: Cataracts, Anemia, N/A N/A Arrhythmia, Congestive Heart Failure, Hypertension, Type II Diabetes, Rheumatoid Arthritis, Osteoarthritis, Neuropathy, Anorexia/bulimia, Confinement Anxiety Date Acquired: 03/09/2018 N/A N/A Weeks of Treatment: 3 N/A N/A Wound Status: Open N/A N/A Clustered Wound: No N/A N/A Clustered Quantity: N/A N/A N/A Measurements L x W x D 3.5x6x0.1 N/A N/A (cm) Area (cm) : 16.493 N/A N/A Volume (cm) : 1.649 N/A N/A % Reduction in Area: 0.00% N/A N/A % Reduction in Volume: 0.00% N/A N/A Classification: Unable to visualize wound bed N/A N/A Exudate Amount: None Present N/A N/A Exudate Type: N/A N/A N/A Exudate Color: N/A N/A N/A Wound Margin: Distinct, outline attached N/A N/A Granulation Amount: None Present (0%) N/A N/A Granulation Quality: N/A N/A N/A Necrotic Amount: Medium (34-66%) N/A N/A Necrotic Tissue: Eschar N/A N/A Exposed Structures: Fascia: No N/A N/A Fat Layer (Subcutaneous Tissue) Exposed: No Tendon: No Muscle: No Joint: No Bone: No Epithelialization: Large (67-100%) N/A N/A Periwound Skin Texture: Callus: Yes N/A N/A Excoriation: No Induration: No Crepitus: No Rash: No Scarring: No Periwound Skin Moisture: Maceration: No N/A N/A Dry/Scaly: No Periwound Skin Color: Atrophie Blanche: No N/A N/A Cyanosis: No Ecchymosis: No Erythema: No Hemosiderin Staining: No Mottled: No Pallor: No Rubor: No Haston, Angeni P. ([id number]) Temperature: No Abnormality N/A N/A Tenderness on Palpation: No  N/A N/A Wound Preparation: Ulcer Cleansing: N/A N/A Rinsed/Irrigated with Saline Topical Anesthetic Applied: None Treatment Notes Electronic Signature(s) Signed: 04/13/2018 4:25:13 PM By: Montey Hora Entered By: Montey Hora on 04/13/2018 11:36:27 Wollman, [place] ([id number]) -------------------------------------------------------------------------------- Multi-Disciplinary Care Plan Details Patient Name: Neira, Lucila P. Date of Service: 04/13/2018 10:45 AM Medical Record Number: [id number] Patient Account Number: [id number] Date of Birth/Sex: 1933-09-10 (85 y.o. F) Treating RN: Montey Hora Primary Care Allayna Erlich: Emily Filbert Other Clinician: Referring Chaunta Bejarano: Emily Filbert Treating Iysha Mishkin/Extender: Melburn Hake, HOYT Weeks in Treatment: 3 Active Inactive Abuse / Safety / Falls / Self Care Management Nursing Diagnoses: Potential for falls Goals: Patient will remain injury free related to falls Date Initiated: 03/23/2018 Target Resolution Date: 06/19/2018 Goal Status: Active Interventions: Assess fall risk on admission and as needed Notes: Orientation to the Wound Care Program Nursing Diagnoses: Knowledge deficit related to the wound healing center program Goals: Patient/caregiver will verbalize understanding of the [place] Program Date Initiated: 03/23/2018 Target Resolution Date: 06/19/2018 Goal Status: Active Interventions: Provide education on orientation to the wound center Notes: Venous Leg Ulcer Nursing Diagnoses: Actual venous Insuffiency (use after diagnosis is confirmed) Goals: Patient will maintain optimal edema control Date Initiated: 03/23/2018 Target Resolution Date: 06/19/2018 Goal Status: Active Interventions: Assess peripheral edema status every visit. TEMIA, DEBROUX ([id number]) Compression as ordered Notes: Wound/Skin Impairment Nursing Diagnoses: Impaired tissue integrity Goals: Ulcer/skin breakdown will heal within 14  weeks Date Initiated: 03/23/2018 Target Resolution Date: 06/19/2018 Goal Status: Active Interventions: Assess patient/caregiver ability to obtain necessary supplies Assess patient/caregiver ability to perform ulcer/skin care regimen upon admission and as needed Assess ulceration(s) every visit Notes: Electronic Signature(s) Signed: 04/13/2018 4:25:13 PM By: Montey Hora Entered By: Montey Hora on 04/13/2018 11:35:00 Plumb, Kandas P. ([id number]) -------------------------------------------------------------------------------- Pain Assessment Details Patient Name: Havrilla, Ermie P. Date of Service: 04/13/2018 10:45 AM Medical Record Number: [id number] Patient Account Number: [id number] Date of Birth/Sex: Sep 28, 1933 (85 y.o. F) Treating RN: Army Melia Primary Care Bennett Vanscyoc: Emily Filbert Other Clinician: Referring Maylani Embree: Emily Filbert Treating Celester Morgan/Extender: Melburn Hake, HOYT Weeks in Treatment: 3 Active Problems Location of Pain Severity and Description of Pain Patient Has Paino No Site Locations  Pain Management and Medication Current Pain Management: Electronic Signature(s) Signed: 04/13/2018 1:18:53 PM By: Army Melia Entered By: Army Melia on 04/13/2018 11:12:33 Donaghue, [place] ([id number]) -------------------------------------------------------------------------------- Patient/Caregiver Education Details Patient Name: Pinkham, Rheta P. Date of Service: 04/13/2018 10:45 AM Medical Record Number: [id number] Patient Account Number: [id number] Date of Birth/Gender: Mar 11, 1933 (85 y.o. F) Treating RN: Montey Hora Primary Care Physician: Emily Filbert Other Clinician: Referring Physician: Emily Filbert Treating Physician/Extender: Sharalyn Ink in Treatment: 3 Education Assessment Education Provided To: Patient and Caregiver Education Topics Provided Offloading: Handouts: Other: ways to offload heels Methods: Explain/Verbal Responses: State content  correctly Electronic Signature(s) Signed: 04/13/2018 4:25:13 PM By: Montey Hora Entered By: Montey Hora on 04/13/2018 11:44:42 Tkach, Goldia P. ([id number]) -------------------------------------------------------------------------------- Wound Assessment Details Patient Name: Deloria, Fina P. Date of Service: 04/13/2018 10:45 AM Medical Record Number: [id number] Patient Account Number: [id number] Date of Birth/Sex: 10-Apr-1933 (85 y.o. F) Treating RN: Army Melia Primary Care Mela Perham: Emily Filbert Other Clinician: Referring Owenn Rothermel: Emily Filbert Treating Needham Biggins/Extender: Melburn Hake, HOYT Weeks in Treatment: 3 Wound Status Wound Number: 1 Primary Diabetic Wound/Ulcer of the Lower Extremity Etiology: Wound Location: Left Foot - Dorsal Wound Open Wounding Event: Gradually Appeared Status: Date Acquired: 03/09/2018 Comorbid Cataracts, Anemia, Arrhythmia, Congestive Heart Weeks Of Treatment: 3 History: Failure, Hypertension, Type II Diabetes, Clustered Wound: Yes Rheumatoid Arthritis, Osteoarthritis, Neuropathy, Anorexia/bulimia, Confinement Anxiety Photos Wound Measurements Length: (cm) 1.5 % Reduction i Width: (cm) 1 % Reduction i Depth: (cm) 0.1 Epithelializa Area: (cm) 1.178 Tunneling: Volume: (cm) 0.118 Undermining: n Area: 95.8% n Volume: 95.8% tion: Large (67-100%) No No Wound Description Classification: Grade 2 Foul Odor Aft Wound Margin: Indistinct, nonvisible Slough/Fibrin Exudate Amount: Large Exudate Type: Serous Exudate Color: amber er Cleansing: No o Yes Wound Bed Granulation Amount: Small (1-33%) Exposed Structure Granulation Quality: Pink Fascia Exposed: No Necrotic Amount: Large (67-100%) Fat Layer (Subcutaneous Tissue) Exposed: Yes Necrotic Quality: Adherent Slough Tendon Exposed: No Muscle Exposed: No Joint Exposed: No Bone Exposed: No Periwound Skin Texture Mane, Evalise P. ([id number]) Texture Color No Abnormalities Noted:  No No Abnormalities Noted: No Callus: No Atrophie Blanche: No Crepitus: No Cyanosis: No Excoriation: No Ecchymosis: No Induration: Yes Erythema: No Rash: No Hemosiderin Staining: No Scarring: No Mottled: No Pallor: No Moisture Rubor: Yes No Abnormalities Noted: No Dry / Scaly: No Temperature / Pain Maceration: Yes Temperature: No Abnormality Tenderness on Palpation: Yes Wound Preparation Ulcer Cleansing: Rinsed/Irrigated with Saline Topical Anesthetic Applied: Other: lidocaine 4%, Treatment Notes Wound #1 (Left, Dorsal Foot) Notes Silver alginate, ABD, 4 Layer wrap Electronic Signature(s) Signed: 04/13/2018 1:18:53 PM By: Army Melia Entered By: Army Melia on 04/13/2018 11:25:35 Carcamo, Ahnna P. ([id number]) -------------------------------------------------------------------------------- Wound Assessment Details Patient Name: Heidel, Samaria P. Date of Service: 04/13/2018 10:45 AM Medical Record Number: [id number] Patient Account Number: [id number] Date of Birth/Sex: 21-Jun-1933 (85 y.o. F) Treating RN: Army Melia Primary Care Kais Monje: Emily Filbert Other Clinician: Referring Abriel Hattery: Emily Filbert Treating Miner Koral/Extender: Melburn Hake, HOYT Weeks in Treatment: 3 Wound Status Wound Number: 2 Primary Diabetic Wound/Ulcer of the Lower Extremity Etiology: Wound Location: Left Toe Second - Dorsal Wound Open Wounding Event: Gradually Appeared Status: Date Acquired: 03/09/2018 Comorbid Cataracts, Anemia, Arrhythmia, Congestive Heart Weeks Of Treatment: 3 History: Failure, Hypertension, Type II Diabetes, Clustered Wound: No Rheumatoid Arthritis, Osteoarthritis, Neuropathy, Anorexia/bulimia, Confinement Anxiety Photos Wound Measurements Length: (cm) 0.8 Width: (cm) 1 Depth: (cm) 0.1 Area: (cm) 0.628 Volume: (cm) 0.063 % Reduction in Area: -185.5% % Reduction in Volume: -186.4% Epithelialization: None Tunneling: No Undermining: No  Wound  Description Classification: Unable to visualize wound bed Wound Margin: Flat and Intact Exudate Amount: None Present Foul Odor After Cleansing: No Slough/Fibrino No Wound Bed Granulation Amount: Large (67-100%) Exposed Structure Granulation Quality: Red Fascia Exposed: No Necrotic Amount: Small (1-33%) Fat Layer (Subcutaneous Tissue) Exposed: No Necrotic Quality: Eschar, Adherent Slough Tendon Exposed: No Muscle Exposed: No Joint Exposed: No Bone Exposed: No Periwound Skin Texture Texture Color No Abnormalities Noted: No No Abnormalities Noted: No Gall, Alyha P. ([id number]) Callus: No Atrophie Blanche: No Crepitus: No Cyanosis: No Excoriation: No Ecchymosis: No Induration: No Erythema: No Rash: No Hemosiderin Staining: No Scarring: No Mottled: No Pallor: No Moisture Rubor: Yes No Abnormalities Noted: No Dry / Scaly: No Temperature / Pain Maceration: No Temperature: No Abnormality Tenderness on Palpation: Yes Wound Preparation Ulcer Cleansing: Rinsed/Irrigated with Saline Topical Anesthetic Applied: None, Other: lidocaine 4%, Treatment Notes Wound #2 (Left, Dorsal Toe Second) Notes Silver alginate, ABD,4 Layer wrap Electronic Signature(s) Signed: 04/13/2018 1:18:53 PM By: Army Melia Entered By: Army Melia on 04/13/2018 11:26:03 Juncaj, Jestine P. ([id number]) -------------------------------------------------------------------------------- Wound Assessment Details Patient Name: Shedrick, Lavida P. Date of Service: 04/13/2018 10:45 AM Medical Record Number: [id number] Patient Account Number: [id number] Date of Birth/Sex: [birth year] (85 y.o. F) Treating RN: Army Melia Primary Care Haidee Stogsdill: Emily Filbert Other Clinician: Referring Kabella Cassidy: Emily Filbert Treating Lalanya Rufener/Extender: Melburn Hake, HOYT Weeks in Treatment: 3 Wound Status Wound Number: 3 Primary Lymphedema Etiology: Wound Location: Left Lower Leg - Anterior Wound Open Wounding Event:  Gradually Appeared Status: Date Acquired: 03/09/2018 Comorbid Cataracts, Anemia, Arrhythmia, Congestive Heart Weeks Of Treatment: 3 History: Failure, Hypertension, Type II Diabetes, Clustered Wound: Yes Rheumatoid Arthritis, Osteoarthritis, Neuropathy, Anorexia/bulimia, Confinement Anxiety Photos Wound Measurements Length: (cm) 1 % Reduct Width: (cm) 1 % Reduct Depth: (cm) 0.1 Epitheli Clustered Quantity: 2 Tunnelin Area: (cm) 0.785 Undermi Volume: (cm) 0.079 ion in Area: 99.3% ion in Volume: 99.3% alization: None g: No ning: No Wound Description Full Thickness Without Exposed Support Foul Odo Classification: Structures Slough/F Wound Margin: Indistinct, nonvisible Exudate Large Amount: Exudate Type: Serous Exudate Color: amber r After Cleansing: No ibrino Yes Wound Bed Granulation Amount: Medium (34-66%) Exposed Structure Granulation Quality: Pink Fascia Exposed: No Necrotic Amount: Small (1-33%) Fat Layer (Subcutaneous Tissue) Exposed: Yes Necrotic Quality: Adherent Slough Tendon Exposed: No Muscle Exposed: No Joint Exposed: No San, Yessenia P. ([id number]) Bone Exposed: No Periwound Skin Texture Texture Color No Abnormalities Noted: No No Abnormalities Noted: No Callus: No Atrophie Blanche: No Crepitus: No Cyanosis: No Excoriation: Yes Ecchymosis: No Induration: No Erythema: No Rash: No Hemosiderin Staining: No Scarring: No Mottled: No Pallor: No Moisture Rubor: Yes No Abnormalities Noted: No Dry / Scaly: No Maceration: Yes Wound Preparation Ulcer Cleansing: Rinsed/Irrigated with Saline Topical Anesthetic Applied: Other: lidocaine 4%, Treatment Notes Wound #3 (Left, Anterior Lower Leg) Notes Silver alginate, ABD, 4 Layer wrap Electronic Signature(s) Signed: 04/13/2018 1:18:53 PM By: Army Melia Entered By: Army Melia on 04/13/2018 11:26:34 Lascano, Lauralie P.  ([id number]) -------------------------------------------------------------------------------- Wound Assessment Details Patient Name: Kwan, Shaundra P. Date of Service: 04/13/2018 10:45 AM Medical Record Number: [id number] Patient Account Number: [id number] Date of Birth/Sex: 1933/03/22 (85 y.o. F) Treating RN: Montey Hora Primary Care Ledon Weihe: Emily Filbert Other Clinician: Referring Yeva Bissette: Emily Filbert Treating Lamiracle Chaidez/Extender: Melburn Hake, HOYT Weeks in Treatment: 3 Wound Status Wound Number: 4 Primary Lymphedema Etiology: Wound Location: Right, Anterior Lower Leg Wound Healed - Epithelialized Wounding Event: Gradually Appeared Status: Date Acquired: 03/09/2018 Comorbid Cataracts, Anemia, Arrhythmia, Congestive Heart Weeks Of Treatment: 3 History: Failure,  Hypertension, Type II Diabetes, Clustered Wound: No Rheumatoid Arthritis, Osteoarthritis, Neuropathy, Anorexia/bulimia, Confinement Anxiety Photos Wound Measurements Length: (cm) 0 % Reduct Width: (cm) 0 % Reduct Depth: (cm) 0 Epitheli Area: (cm) 0 Tunneli Volume: (cm) 0 Undermi ion in Area: 100% ion in Volume: 100% alization: Large (67-100%) ng: No ning: No Wound Description Classification: Partial Thickness Foul Odo Wound Margin: Indistinct, nonvisible Slough/F Exudate Amount: None Present r After Cleansing: No ibrino No Wound Bed Granulation Amount: Small (1-33%) Exposed Structure Granulation Quality: Pink Fascia Exposed: No Necrotic Amount: None Present (0%) Fat Layer (Subcutaneous Tissue) Exposed: No Tendon Exposed: No Muscle Exposed: No Joint Exposed: No Bone Exposed: No Limited to Skin Breakdown Periwound Skin Texture Texture Color Westbrook, Brina P. ([id number]) No Abnormalities Noted: No No Abnormalities Noted: No Callus: No Atrophie Blanche: No Crepitus: No Cyanosis: No Excoriation: No Ecchymosis: No Induration: No Erythema: No Rash: No Hemosiderin Staining: No Scarring:  No Mottled: No Pallor: No Moisture Rubor: Yes No Abnormalities Noted: No Dry / Scaly: No Temperature / Pain Maceration: Yes Temperature: No Abnormality Tenderness on Palpation: Yes Wound Preparation Ulcer Cleansing: Rinsed/Irrigated with Saline Topical Anesthetic Applied: Other: lidocaine 4%, Electronic Signature(s) Signed: 04/13/2018 4:25:13 PM By: Montey Hora Entered By: Montey Hora on 04/13/2018 11:36:15 Cajuste, Lillyann P. ([id number]) -------------------------------------------------------------------------------- Wound Assessment Details Patient Name: Velis, Latangela P. Date of Service: 04/13/2018 10:45 AM Medical Record Number: [id number] Patient Account Number: [id number] Date of Birth/Sex: 1933/05/03 (85 y.o. F) Treating RN: Montey Hora Primary Care Lovelle Deitrick: Emily Filbert Other Clinician: Referring Jeree Delcid: Emily Filbert Treating Janara Klett/Extender: Melburn Hake, HOYT Weeks in Treatment: 3 Wound Status Wound Number: 5 Primary Lymphedema Etiology: Wound Location: Right, Lateral Lower Leg Wound Healed - Epithelialized Wounding Event: Gradually Appeared Status: Date Acquired: 03/09/2018 Comorbid Cataracts, Anemia, Arrhythmia, Congestive Heart Weeks Of Treatment: 3 History: Failure, Hypertension, Type II Diabetes, Clustered Wound: Yes Rheumatoid Arthritis, Osteoarthritis, Neuropathy, Anorexia/bulimia, Confinement Anxiety Photos Wound Measurements Length: (cm) 0 % Reduct Width: (cm) 0 % Reduct Depth: (cm) 0 Epitheli Area: (cm) 0 Tunneli Volume: (cm) 0 Undermi ion in Area: 100% ion in Volume: 100% alization: Large (67-100%) ng: No ning: No Wound Description Classification: Partial Thickness Foul Odo Wound Margin: Indistinct, nonvisible Slough/F Exudate Amount: Large Exudate Type: Serous Exudate Color: amber r After Cleansing: No ibrino No Wound Bed Granulation Amount: Small (1-33%) Exposed Structure Granulation Quality: Pink Fascia Exposed:  No Necrotic Amount: None Present (0%) Fat Layer (Subcutaneous Tissue) Exposed: No Tendon Exposed: No Muscle Exposed: No Joint Exposed: No Bone Exposed: No Periwound Skin Texture Kahre, Lonya P. ([id number]) Texture Color No Abnormalities Noted: No No Abnormalities Noted: No Callus: No Atrophie Blanche: No Crepitus: No Cyanosis: No Excoriation: No Ecchymosis: No Induration: No Erythema: No Rash: No Hemosiderin Staining: No Scarring: No Mottled: No Pallor: No Moisture Rubor: Yes No Abnormalities Noted: No Dry / Scaly: No Temperature / Pain Maceration: Yes Temperature: No Abnormality Tenderness on Palpation: Yes Wound Preparation Ulcer Cleansing: Rinsed/Irrigated with Saline Topical Anesthetic Applied: Other: lidocaine 4%, Electronic Signature(s) Signed: 04/13/2018 4:25:13 PM By: Montey Hora Entered By: Montey Hora on 04/13/2018 11:36:16 Kassin, Marcell P. ([id number]) -------------------------------------------------------------------------------- Wound Assessment Details Patient Name: Steller, Kattaleya P. Date of Service: 04/13/2018 10:45 AM Medical Record Number: [id number] Patient Account Number: [id number] Date of Birth/Sex: 1933/05/30 (85 y.o. F) Treating RN: Army Melia Primary Care Erynn Vaca: Emily Filbert Other Clinician: Referring Jorryn Casagrande: Emily Filbert Treating Nuel Dejaynes/Extender: Melburn Hake, HOYT Weeks in Treatment: 3 Wound Status Wound Number: 6 Primary Pressure Ulcer Etiology: Wound Location: Left Calcaneus Wound Open  Wounding Event: Gradually Appeared Status: Date Acquired: 03/08/2018 Comorbid Cataracts, Anemia, Arrhythmia, Congestive Heart Weeks Of Treatment: 3 History: Failure, Hypertension, Type II Diabetes, Clustered Wound: No Rheumatoid Arthritis, Osteoarthritis, Neuropathy, Anorexia/bulimia, Confinement Anxiety Photos Wound Measurements Length: (cm) 1 % Reduction i Width: (cm) 1 % Reduction i Depth: (cm) 0.2 Epithelializa Area:  (cm) 0.785 Tunneling: Volume: (cm) 0.157 Undermining: n Area: 24.3% n Volume: 24.2% tion: None No No Wound Description Classification: Category/Stage II Foul Odor Aft Wound Margin: Flat and Intact Slough/Fibrin Exudate Amount: Large Exudate Type: Serous Exudate Color: amber er Cleansing: No o Yes Wound Bed Granulation Amount: Small (1-33%) Exposed Structure Granulation Quality: Pink Fascia Exposed: No Necrotic Amount: Large (67-100%) Fat Layer (Subcutaneous Tissue) Exposed: Yes Necrotic Quality: Eschar, Adherent Slough Tendon Exposed: No Muscle Exposed: No Joint Exposed: No Bone Exposed: No Periwound Skin Texture Haris, Anvitha P. ([id number]) Texture Color No Abnormalities Noted: No No Abnormalities Noted: No Callus: No Atrophie Blanche: No Crepitus: No Cyanosis: No Excoriation: No Ecchymosis: No Induration: No Erythema: No Rash: No Hemosiderin Staining: No Scarring: No Mottled: No Pallor: No Moisture Rubor: No No Abnormalities Noted: No Dry / Scaly: No Temperature / Pain Maceration: Yes Temperature: No Abnormality Tenderness on Palpation: Yes Wound Preparation Ulcer Cleansing: Rinsed/Irrigated with Saline Topical Anesthetic Applied: Other: lidocaine 4%, Treatment Notes Wound #6 (Left Calcaneus) Notes Silver alginate, ABD, 4 Layer wrap Electronic Signature(s) Signed: 04/13/2018 1:18:53 PM By: Army Melia Entered By: Army Melia on 04/13/2018 11:29:03 Bloomquist, Charlet P. ([id number]) -------------------------------------------------------------------------------- Wound Assessment Details Patient Name: Kloepfer, Samyiah P. Date of Service: 04/13/2018 10:45 AM Medical Record Number: [id number] Patient Account Number: [id number] Date of Birth/Sex: [birth year] (85 y.o. F) Treating RN: Army Melia Primary Care Amit Leece: Emily Filbert Other Clinician: Referring Prarthana Parlin: Emily Filbert Treating Ameira Alessandrini/Extender: Melburn Hake, HOYT Weeks in Treatment:  3 Wound Status Wound Number: 7 Primary Diabetic Wound/Ulcer of the Lower Extremity Etiology: Wound Location: Right Calcaneus Secondary Pressure Ulcer Wounding Event: Gradually Appeared Etiology: Date Acquired: 03/09/2018 Wound Open Weeks Of Treatment: 3 Status: Clustered Wound: No Comorbid Cataracts, Anemia, Arrhythmia, Congestive History: Heart Failure, Hypertension, Type II Diabetes, Rheumatoid Arthritis, Osteoarthritis, Neuropathy, Anorexia/bulimia, Confinement Anxiety Photos Wound Measurements Length: (cm) 3.5 Width: (cm) 6 Depth: (cm) 0.1 Area: (cm) 16.493 Volume: (cm) 1.649 % Reduction in Area: 0% % Reduction in Volume: 0% Epithelialization: Large (67-100%) Tunneling: No Undermining: No Wound Description Classification: Unable to visualize wound bed Foul Odor Wound Margin: Distinct, outline attached Slough/Fib Exudate Amount: None Present After Cleansing: No rino No Wound Bed Granulation Amount: None Present (0%) Exposed Structure Necrotic Amount: Medium (34-66%) Fascia Exposed: No Necrotic Quality: Eschar Fat Layer (Subcutaneous Tissue) Exposed: No Tendon Exposed: No Muscle Exposed: No Joint Exposed: No Bone Exposed: No Periwound Skin Texture Krager, Kimorah P. ([id number]) Texture Color No Abnormalities Noted: No No Abnormalities Noted: No Callus: Yes Atrophie Blanche: No Crepitus: No Cyanosis: No Excoriation: No Ecchymosis: No Induration: No Erythema: No Rash: No Hemosiderin Staining: No Scarring: No Mottled: No Pallor: No Moisture Rubor: No No Abnormalities Noted: No Dry / Scaly: No Temperature / Pain Maceration: No Temperature: No Abnormality Wound Preparation Ulcer Cleansing: Rinsed/Irrigated with Saline Topical Anesthetic Applied: None Treatment Notes Wound #7 (Right Calcaneus) Notes Betadine to heel, ABD, 4-Layer Electronic Signature(s) Signed: 04/13/2018 1:18:53 PM By: Army Melia Entered By: Army Melia on 04/13/2018  11:29:35 Lehan, Aleza P. ([id number]) -------------------------------------------------------------------------------- Vitals Details Patient Name: Coor, Galilee P. Date of Service: 04/13/2018 10:45 AM Medical Record Number: [id number] Patient Account Number: [id number] Date of Birth/Sex: [birth year] (84 y.o.  F) Treating RN: Army Melia Primary Care Kenita Bines: Emily Filbert Other Clinician: Referring Kelven Flater: Emily Filbert Treating Latese Dufault/Extender: Melburn Hake, HOYT Weeks in Treatment: 3 Vital Signs Time Taken: 11:12 Temperature (F): 97.7 Height (in): 65 Pulse (bpm): 60 Weight (lbs): 155 Respiratory Rate (breaths/min): 16 Body Mass Index (BMI): 25.8 Blood Pressure (mmHg): 126/60 Reference Range: 80 - 120 mg / dl Electronic Signature(s) Signed: 04/13/2018 1:18:53 PM By: Army Melia Entered By: Army Melia on 04/13/2018 11:14:00

## 2018-04-19 ENCOUNTER — Other Ambulatory Visit: Payer: Self-pay

## 2018-04-19 ENCOUNTER — Emergency Department
Admission: EM | Admit: 2018-04-19 | Discharge: 2018-04-19 | Disposition: A | Discharge status: Home / Self Care | Payer: PPO | Attending: Emergency Medicine | Admitting: Emergency Medicine

## 2018-04-19 ENCOUNTER — Emergency Department: Payer: PPO

## 2018-04-19 DIAGNOSIS — G8929 Other chronic pain: Secondary | ICD-10-CM | POA: Diagnosis not present

## 2018-04-19 DIAGNOSIS — N390 Urinary tract infection, site not specified: Secondary | ICD-10-CM | POA: Diagnosis not present

## 2018-04-19 DIAGNOSIS — C7951 Secondary malignant neoplasm of bone: Secondary | ICD-10-CM | POA: Diagnosis not present

## 2018-04-19 DIAGNOSIS — M47897 Other spondylosis, lumbosacral region: Secondary | ICD-10-CM | POA: Diagnosis not present

## 2018-04-19 DIAGNOSIS — E7800 Pure hypercholesterolemia, unspecified: Secondary | ICD-10-CM | POA: Diagnosis not present

## 2018-04-19 DIAGNOSIS — M85852 Other specified disorders of bone density and structure, left thigh: Secondary | ICD-10-CM | POA: Diagnosis not present

## 2018-04-19 DIAGNOSIS — R739 Hyperglycemia, unspecified: Secondary | ICD-10-CM | POA: Diagnosis present

## 2018-04-19 DIAGNOSIS — F329 Major depressive disorder, single episode, unspecified: Secondary | ICD-10-CM | POA: Diagnosis not present

## 2018-04-19 DIAGNOSIS — Z7401 Bed confinement status: Secondary | ICD-10-CM | POA: Diagnosis not present

## 2018-04-19 DIAGNOSIS — K760 Fatty (change of) liver, not elsewhere classified: Secondary | ICD-10-CM | POA: Diagnosis not present

## 2018-04-19 DIAGNOSIS — Z87891 Personal history of nicotine dependence: Secondary | ICD-10-CM | POA: Insufficient documentation

## 2018-04-19 DIAGNOSIS — M545 Low back pain: Secondary | ICD-10-CM | POA: Diagnosis not present

## 2018-04-19 DIAGNOSIS — E039 Hypothyroidism, unspecified: Secondary | ICD-10-CM | POA: Insufficient documentation

## 2018-04-19 DIAGNOSIS — E1165 Type 2 diabetes mellitus with hyperglycemia: Secondary | ICD-10-CM | POA: Insufficient documentation

## 2018-04-19 DIAGNOSIS — N183 Chronic kidney disease, stage 3 unspecified: Secondary | ICD-10-CM | POA: Diagnosis not present

## 2018-04-19 DIAGNOSIS — E1122 Type 2 diabetes mellitus with diabetic chronic kidney disease: Secondary | ICD-10-CM | POA: Diagnosis not present

## 2018-04-19 DIAGNOSIS — M2550 Pain in unspecified joint: Secondary | ICD-10-CM | POA: Diagnosis not present

## 2018-04-19 DIAGNOSIS — T451X5D Adverse effect of antineoplastic and immunosuppressive drugs, subsequent encounter: Secondary | ICD-10-CM | POA: Diagnosis not present

## 2018-04-19 DIAGNOSIS — Z79899 Other long term (current) drug therapy: Secondary | ICD-10-CM | POA: Insufficient documentation

## 2018-04-19 DIAGNOSIS — E8881 Metabolic syndrome: Secondary | ICD-10-CM | POA: Diagnosis not present

## 2018-04-19 DIAGNOSIS — F4321 Adjustment disorder with depressed mood: Secondary | ICD-10-CM | POA: Diagnosis not present

## 2018-04-19 DIAGNOSIS — G720 Drug-induced myopathy: Secondary | ICD-10-CM | POA: Diagnosis not present

## 2018-04-19 DIAGNOSIS — Z96643 Presence of artificial hip joint, bilateral: Secondary | ICD-10-CM | POA: Diagnosis not present

## 2018-04-19 DIAGNOSIS — M4807 Spinal stenosis, lumbosacral region: Secondary | ICD-10-CM | POA: Diagnosis not present

## 2018-04-19 DIAGNOSIS — R5383 Other fatigue: Secondary | ICD-10-CM | POA: Diagnosis not present

## 2018-04-19 DIAGNOSIS — J449 Chronic obstructive pulmonary disease, unspecified: Secondary | ICD-10-CM | POA: Diagnosis not present

## 2018-04-19 DIAGNOSIS — C3411 Malignant neoplasm of upper lobe, right bronchus or lung: Secondary | ICD-10-CM | POA: Diagnosis not present

## 2018-04-19 DIAGNOSIS — Z9861 Coronary angioplasty status: Secondary | ICD-10-CM | POA: Diagnosis not present

## 2018-04-19 DIAGNOSIS — M654 Radial styloid tenosynovitis [de Quervain]: Secondary | ICD-10-CM | POA: Diagnosis not present

## 2018-04-19 DIAGNOSIS — M792 Neuralgia and neuritis, unspecified: Secondary | ICD-10-CM | POA: Diagnosis not present

## 2018-04-19 DIAGNOSIS — I130 Hypertensive heart and chronic kidney disease with heart failure and stage 1 through stage 4 chronic kidney disease, or unspecified chronic kidney disease: Secondary | ICD-10-CM | POA: Insufficient documentation

## 2018-04-19 DIAGNOSIS — I10 Essential (primary) hypertension: Secondary | ICD-10-CM | POA: Diagnosis not present

## 2018-04-19 DIAGNOSIS — E1169 Type 2 diabetes mellitus with other specified complication: Secondary | ICD-10-CM | POA: Diagnosis not present

## 2018-04-19 DIAGNOSIS — Z950 Presence of cardiac pacemaker: Secondary | ICD-10-CM | POA: Diagnosis not present

## 2018-04-19 DIAGNOSIS — R109 Unspecified abdominal pain: Secondary | ICD-10-CM | POA: Diagnosis not present

## 2018-04-19 DIAGNOSIS — Z9049 Acquired absence of other specified parts of digestive tract: Secondary | ICD-10-CM | POA: Diagnosis not present

## 2018-04-19 DIAGNOSIS — J3089 Other allergic rhinitis: Secondary | ICD-10-CM | POA: Diagnosis not present

## 2018-04-19 DIAGNOSIS — D6481 Anemia due to antineoplastic chemotherapy: Secondary | ICD-10-CM | POA: Diagnosis not present

## 2018-04-19 DIAGNOSIS — Z9889 Other specified postprocedural states: Secondary | ICD-10-CM | POA: Diagnosis not present

## 2018-04-19 DIAGNOSIS — Z23 Encounter for immunization: Secondary | ICD-10-CM | POA: Diagnosis not present

## 2018-04-19 DIAGNOSIS — M25559 Pain in unspecified hip: Secondary | ICD-10-CM | POA: Diagnosis not present

## 2018-04-19 DIAGNOSIS — E785 Hyperlipidemia, unspecified: Secondary | ICD-10-CM | POA: Diagnosis not present

## 2018-04-19 DIAGNOSIS — J301 Allergic rhinitis due to pollen: Secondary | ICD-10-CM | POA: Diagnosis not present

## 2018-04-19 DIAGNOSIS — I5032 Chronic diastolic (congestive) heart failure: Secondary | ICD-10-CM | POA: Diagnosis not present

## 2018-04-19 DIAGNOSIS — K439 Ventral hernia without obstruction or gangrene: Secondary | ICD-10-CM | POA: Diagnosis not present

## 2018-04-19 DIAGNOSIS — R918 Other nonspecific abnormal finding of lung field: Secondary | ICD-10-CM | POA: Diagnosis not present

## 2018-04-19 DIAGNOSIS — E042 Nontoxic multinodular goiter: Secondary | ICD-10-CM | POA: Diagnosis not present

## 2018-04-19 DIAGNOSIS — I2510 Atherosclerotic heart disease of native coronary artery without angina pectoris: Secondary | ICD-10-CM | POA: Insufficient documentation

## 2018-04-19 DIAGNOSIS — M109 Gout, unspecified: Secondary | ICD-10-CM | POA: Diagnosis not present

## 2018-04-19 DIAGNOSIS — R05 Cough: Secondary | ICD-10-CM

## 2018-04-19 DIAGNOSIS — R52 Pain, unspecified: Secondary | ICD-10-CM | POA: Diagnosis not present

## 2018-04-19 DIAGNOSIS — J309 Allergic rhinitis, unspecified: Secondary | ICD-10-CM | POA: Diagnosis not present

## 2018-04-19 DIAGNOSIS — Z0000 Encounter for general adult medical examination without abnormal findings: Secondary | ICD-10-CM | POA: Diagnosis not present

## 2018-04-19 DIAGNOSIS — Z125 Encounter for screening for malignant neoplasm of prostate: Secondary | ICD-10-CM | POA: Diagnosis not present

## 2018-04-19 DIAGNOSIS — L02215 Cutaneous abscess of perineum: Secondary | ICD-10-CM | POA: Diagnosis not present

## 2018-04-19 DIAGNOSIS — R059 Cough, unspecified: Secondary | ICD-10-CM

## 2018-04-19 DIAGNOSIS — Z452 Encounter for adjustment and management of vascular access device: Secondary | ICD-10-CM | POA: Diagnosis not present

## 2018-04-19 DIAGNOSIS — E669 Obesity, unspecified: Secondary | ICD-10-CM | POA: Diagnosis not present

## 2018-04-19 DIAGNOSIS — K219 Gastro-esophageal reflux disease without esophagitis: Secondary | ICD-10-CM | POA: Diagnosis not present

## 2018-04-19 DIAGNOSIS — E782 Mixed hyperlipidemia: Secondary | ICD-10-CM | POA: Diagnosis not present

## 2018-04-19 DIAGNOSIS — A408 Other streptococcal sepsis: Secondary | ICD-10-CM | POA: Diagnosis not present

## 2018-04-19 DIAGNOSIS — K7460 Unspecified cirrhosis of liver: Secondary | ICD-10-CM | POA: Diagnosis not present

## 2018-04-19 DIAGNOSIS — B952 Enterococcus as the cause of diseases classified elsewhere: Secondary | ICD-10-CM | POA: Diagnosis not present

## 2018-04-19 DIAGNOSIS — Z792 Long term (current) use of antibiotics: Secondary | ICD-10-CM | POA: Diagnosis not present

## 2018-04-19 DIAGNOSIS — M1990 Unspecified osteoarthritis, unspecified site: Secondary | ICD-10-CM | POA: Diagnosis not present

## 2018-04-19 DIAGNOSIS — Z7984 Long term (current) use of oral hypoglycemic drugs: Secondary | ICD-10-CM | POA: Insufficient documentation

## 2018-04-19 DIAGNOSIS — D61818 Other pancytopenia: Secondary | ICD-10-CM | POA: Diagnosis not present

## 2018-04-19 DIAGNOSIS — Z9181 History of falling: Secondary | ICD-10-CM | POA: Diagnosis not present

## 2018-04-19 DIAGNOSIS — M4726 Other spondylosis with radiculopathy, lumbar region: Secondary | ICD-10-CM | POA: Diagnosis not present

## 2018-04-19 DIAGNOSIS — M25519 Pain in unspecified shoulder: Secondary | ICD-10-CM | POA: Diagnosis not present

## 2018-04-19 DIAGNOSIS — M48062 Spinal stenosis, lumbar region with neurogenic claudication: Secondary | ICD-10-CM | POA: Diagnosis not present

## 2018-04-19 DIAGNOSIS — Z1211 Encounter for screening for malignant neoplasm of colon: Secondary | ICD-10-CM | POA: Diagnosis not present

## 2018-04-19 DIAGNOSIS — R0902 Hypoxemia: Secondary | ICD-10-CM | POA: Diagnosis not present

## 2018-04-19 LAB — BASIC METABOLIC PANEL
Anion gap: 10 (ref 5–15)
BUN: 36 mg/dL — ABNORMAL HIGH (ref 8–23)
CO2: 28 mmol/L (ref 22–32)
Calcium: 8.3 mg/dL — ABNORMAL LOW (ref 8.9–10.3)
Chloride: 92 mmol/L — ABNORMAL LOW (ref 98–111)
Creatinine, Ser: 1.96 mg/dL — ABNORMAL HIGH (ref 0.44–1.00)
GFR calc Af Amer: 27 mL/min — ABNORMAL LOW (ref >60)
GFR calc non Af Amer: 23 mL/min — ABNORMAL LOW (ref >60)
Glucose, Bld: 547 mg/dL (ref 70–99)
Potassium: 3.6 mmol/L (ref 3.5–5.1)
Sodium: 130 mmol/L — ABNORMAL LOW (ref 135–145)

## 2018-04-19 LAB — CBC
HCT: 39.6 % (ref 36.0–46.0)
Hemoglobin: 12.7 g/dL (ref 12.0–15.0)
MCH: 29.2 pg (ref 26.0–34.0)
MCHC: 32.1 g/dL (ref 30.0–36.0)
MCV: 91 fL (ref 80.0–100.0)
Platelets: 195 10*3/uL (ref 150–400)
RBC: 4.35 MIL/uL (ref 3.87–5.11)
RDW: 13.3 % (ref 11.5–15.5)
WBC: 11.3 10*3/uL — ABNORMAL HIGH (ref 4.0–10.5)
nRBC: 0 % (ref 0.0–0.2)

## 2018-04-19 LAB — GLUCOSE, CAPILLARY
Glucose-Capillary: 475 mg/dL — ABNORMAL HIGH (ref 70–99)
Glucose-Capillary: 462 mg/dL — ABNORMAL HIGH (ref 70–99)
Glucose-Capillary: 317 mg/dL — ABNORMAL HIGH (ref 70–99)
Glucose-Capillary: 462 mg/dL — ABNORMAL HIGH (ref 70–99)

## 2018-04-19 LAB — URINALYSIS, COMPLETE (UACMP) WITH MICROSCOPIC
Bacteria, UA: NONE SEEN
Bilirubin Urine: NEGATIVE
Glucose, UA: >=500 mg/dL — AB
Ketones, ur: NEGATIVE mg/dL
Nitrite: NEGATIVE
Protein, ur: 100 mg/dL — AB
RBC / HPF: NONE SEEN RBC/hpf (ref 0–5)
Specific Gravity, Urine: 1.014 (ref 1.005–1.030)
WBC, UA: >50 WBC/hpf (ref 0–5)
pH: 6 (ref 5.0–8.0)

## 2018-04-19 MED ORDER — CEPHALEXIN 500 MG PO CAPS
500.0000 mg | ORAL_CAPSULE | Freq: Once | ORAL | Status: AC
  Administered 2018-04-19: 500 mg via ORAL
  Filled 2018-04-19: qty 1

## 2018-04-19 MED ORDER — SODIUM CHLORIDE 0.9 % IV BOLUS: 1000.0000 mL | Freq: Once | INTRAVENOUS | Status: DC

## 2018-04-19 MED ORDER — CEPHALEXIN 500 MG PO CAPS: 500.0000 mg | ORAL_CAPSULE | Freq: Four times a day (QID) | ORAL | 0 refills | Status: DC

## 2018-04-19 MED ORDER — INSULIN ASPART 100 UNIT/ML ~~LOC~~ SOLN
2.0000 [IU] | Freq: Once | SUBCUTANEOUS | Status: AC
  Administered 2018-04-19: 2 [IU] via INTRAVENOUS
  Filled 2018-04-19: qty 1

## 2018-04-19 MED ORDER — INSULIN ASPART 100 UNIT/ML ~~LOC~~ SOLN
4.0000 [IU] | Freq: Once | SUBCUTANEOUS | Status: AC
  Administered 2018-04-19: 4 [IU] via INTRAVENOUS
  Filled 2018-04-19: qty 1

## 2018-04-19 NOTE — ED Notes (Signed)
Instructed by MD to give 1L bolus in 250cc increments, monitoring for fluid overload.

## 2018-04-19 NOTE — Discharge Instructions (Addendum)
Take the Keflex 1pill 4 times a day for the UTI.  Conduct careful perineal hygiene twice a day for the next 2 weeks.  Do fingersticks before breakfast before lunch before supper and before bed and record results.  Return to the hospital for fingerstick reading over 350.  Be sure to take all her other medications.

## 2018-04-19 NOTE — ED Notes (Signed)
Pt arrived to ed with mask on ans coughing periodically during triage. Cough sound congested.

## 2018-04-19 NOTE — ED Notes (Signed)
RN spoke with son Sharon [name] multiple times regarding patient's ER visit and treatment for UTI. Reiterated to son that patient's elevated blood sugar can contribute to UTIs and that patient needs her brief changed more often. Patient discharged in stable condition.

## 2018-04-19 NOTE — ED Provider Notes (Signed)
[facility] Emergency Department Provider Note   ____________________________________________   First MD Initiated Contact with Patient 04/19/18 1513     (approximate)  I have reviewed the triage vital signs and the nursing notes.   HISTORY  Chief Complaint Hyperglycemia    HPI Sharon [name] is a 85 y.o. female who comes in for high blood sugar.  Patient says she has no idea why her blood sugar is high.  She denies any dysuria fever cough or aches or pains.  She says she wants to go home.  I explained to her we have to get her blood sugar down.  She does have a history of congestive heart failure.  We will give her some fluids and 250 cc boluses and check her in between.         Past Medical History:  Diagnosis Date  . Anemia   . Atrial fibrillation ([place])   . Cardiac arrest ([place])   . Cataract   . CHF (congestive heart failure) ([place])   . Diabetes mellitus without complication ([place])   . Edema    feet/ankles occas  . Hip fracture ([place])   . Hyperlipidemia   . Hypertension   . Hypothyroid   . Neuropathy   . Osteomyelitis ([place])    left first metatarsal  . Stroke [facility])     Patient Active Problem List   Diagnosis Date Noted  . Lymphedema 03/24/2018  . Pressure injury of skin 03/12/2018  . Atherosclerosis of native arteries of the extremities with ulceration ([place]) 05/07/2017  . Non-healing ulcer ([place]) 04/16/2017  . Diabetic neuropathy ([place]) 04/07/2017  . Septic shock ([place]) 03/20/2017  . CKD (chronic kidney disease), stage III ([place]) 03/19/2017  . Goals of care, counseling/discussion 03/03/2017  . PAD (peripheral artery disease) ([place]) 02/03/2017  . Abdominal aortic stenosis 02/03/2017  . Bilateral lower extremity edema 02/03/2017  . Anemia 01/29/2017  . B12 deficiency 01/29/2017  . Pancreatic mass 01/29/2017  . Hyponatremia 07/07/2015  . Foot ulcer ([place]) 02/07/2015  . Acute on chronic renal failure ([place]) 11/05/2014  . Diabetic foot  infection ([place]) 10/10/2014  . Chronic diastolic heart failure ([place]) 05/31/2014  . HTN (hypertension) 05/31/2014  . DM (diabetes mellitus), type 2, uncontrolled ([place]) 05/31/2014    Past Surgical History:  Procedure Laterality Date  . ACHILLES TENDON SURGERY Left 02/08/2015   Procedure: ACHILLES LENGTHENING/KIDNER;  Surgeon: Albertine Patricia, DPM;  Location: [facility] ORS;  Service: Podiatry;  Laterality: Left;  . APPENDECTOMY    . CARDIAC CATHETERIZATION  08/25/13  . CATARACT EXTRACTION W/PHACO Left 07/20/2014   Procedure: CATARACT EXTRACTION PHACO AND INTRAOCULAR LENS PLACEMENT (IOC);  Surgeon: Leandrew Koyanagi, MD;  Location: [facility] ORS;  Service: Ophthalmology;  Laterality: Left;  [place]  1:18                 AP     23.6             CDE   9.69      lot #[phone]  . CHOLECYSTECTOMY    . CORONARY ANGIOPLASTY    . HALLUX VALGUS AKIN Left 02/08/2015   Procedure: HALLUX VALGUS AKIN/ KELLER;  Surgeon: Albertine Patricia, DPM;  Location: [facility] ORS;  Service: Podiatry;  Laterality: Left;  IVA with Local needs 1 hour for this case   . HEMIARTHROPLASTY HIP Right   . HEMIARTHROPLASTY HIP Left   . INCISION AND DRAINAGE Left 10/11/2014   Procedure: Removal of infected tibial sessmoid;  Surgeon: Albertine Patricia, DPM;  Location: [facility] ORS;  Service: Podiatry;  Laterality: Left;  . IRRIGATION AND DEBRIDEMENT FOOT Right 03/21/2017   Procedure: IRRIGATION AND DEBRIDEMENT FOOT right great toe amputation;  Surgeon: Sharlotte Alamo, DPM;  Location: [facility] ORS;  Service: Podiatry;  Laterality: Right;  . IRRIGATION AND DEBRIDEMENT FOOT Right 04/18/2017   Procedure: IRRIGATION AND DEBRIDEMENT FOOT and application wound vac;  Surgeon: Albertine Patricia, DPM;  Location: [facility] ORS;  Service: Podiatry;  Laterality: Right;  . PERIPHERAL VASCULAR BALLOON ANGIOPLASTY Right 04/17/2017   Procedure: PERIPHERAL VASCULAR BALLOON ANGIOPLASTY;  Surgeon: Katha Cabal, MD;  Location: [facility];  Service: Cardiovascular;  Laterality: Right;     Prior to Admission medications   Medication Sig Start Date End Date Taking? Authorizing Provider  acetaminophen (TYLENOL) 500 MG tablet Take 1,000 mg by mouth at bedtime. For pain management    [provider]  acetaminophen (TYLENOL) 650 MG CR tablet Take 650 mg by mouth every 8 (eight) hours as needed. For discomfort    [provider]  Amino Acids-Protein Hydrolys (FEEDING SUPPLEMENT, PRO-STAT SUGAR FREE 64,) LIQD Take 30 mLs by mouth 2 (two) times daily between meals.    [provider]  amiodarone (PACERONE) 200 MG tablet Take 1 tablet (200 mg total) by mouth daily. 03/24/17   Gladstone Lighter, MD  amitriptyline (ELAVIL) 10 MG tablet Take 10 mg by mouth daily.     [provider]  atorvastatin (LIPITOR) 10 MG tablet Take 10 mg by mouth daily at 10 pm.     [provider]  cephALEXin (KEFLEX) 250 MG capsule Take 1 capsule (250 mg total) by mouth 3 (three) times daily. 03/27/18   Paulette Blanch, MD  cephALEXin (KEFLEX) 500 MG capsule Take 1 capsule (500 mg total) by mouth 4 (four) times daily for 10 days. 04/19/18 04/29/18  Nena Polio, MD  cholecalciferol (VITAMIN D) 1000 units tablet Take 1,000 Units by mouth daily.    [provider]  collagenase (SANTYL) ointment Apply topically daily. 03/12/18   Stark Jock Jude, MD  ferrous sulfate 325 (65 FE) MG tablet Take 325 mg by mouth daily.     [provider]  furosemide (LASIX) 20 MG tablet  03/15/18   [provider]  gabapentin (NEURONTIN) 100 MG capsule Take 2 capsules (200 mg total) by mouth 3 (three) times daily. 03/28/18 03/28/19  Rusty Aus, MD  glimepiride (AMARYL) 4 MG tablet Take 4 mg by mouth daily.     [provider]  GLUCERNA (GLUCERNA) LIQD Take 237 mLs by mouth 2 (two) times daily between meals.    [provider]  JANUVIA 50 MG tablet Take 50 mg by mouth daily. Take along with Amaryl 02/15/18   [provider]  levothyroxine (SYNTHROID,  LEVOTHROID) 112 MCG tablet Take 112 mcg by mouth daily before breakfast. 30 minutes before breakfast    [provider]  metoprolol tartrate (LOPRESSOR) 25 MG tablet Take 1 tablet (25 mg total) by mouth 2 (two) times daily. 04/21/17   Saundra Shelling, MD  mupirocin ointment (BACTROBAN) 2 % Place 1 application into the nose 2 (two) times daily.    [provider]  traMADol (ULTRAM) 50 MG tablet Take 1 tablet (50 mg total) by mouth every 6 (six) hours as needed for moderate pain. 03/27/18   Paulette Blanch, MD  traZODone (DESYREL) 50 MG tablet Take 50 mg by mouth at bedtime. For anxiety 06/24/17 06/24/18  [provider]  vitamin C (ASCORBIC  ACID) 500 MG tablet Take 500 mg by mouth daily.     [provider]    Allergies Carbamazepine; Cymbalta [duloxetine hcl]; Duloxetine; Lyrica [pregabalin]; and Temazepam  Family History  Problem Relation Age of Onset  . Heart attack Mother     Social History Social History   Tobacco Use  . Smoking status: Former Smoker    Packs/day: 0.50    Years: 7.00    Pack years: 3.50    Types: Cigarettes    Last attempt to quit: 05/30/1989    Years since quitting: 28.9  . Smokeless tobacco: Never Used  Substance Use Topics  . Alcohol use: No    Alcohol/week: 0.0 standard drinks  . Drug use: No    Review of Systems  Constitutional: No fever/chills Eyes: No visual changes. ENT: No sore throat. Cardiovascular: Denies chest pain. Respiratory: Denies shortness of breath. Gastrointestinal: No abdominal pain.  No nausea, no vomiting.  No diarrhea.  No constipation. Genitourinary: Negative for dysuria. Musculoskeletal: Negative for back pain. Skin: Negative for rash. Neurological: Negative for headaches, focal weakness   ____________________________________________   PHYSICAL EXAM:  VITAL SIGNS: ED Triage Vitals  Enc Vitals Group     BP 04/19/18 1437 (!) 184/56     Pulse Rate 04/19/18 1437 68     Resp 04/19/18 1437 20      Temp 04/19/18 1437 97.9 F (36.6 C)     Temp Source 04/19/18 1437 Oral     SpO2 04/19/18 1437 93 %     Weight 04/19/18 1438 154 lb (69.9 kg)     Height 04/19/18 1438 5\' 5"  (1.651 m)     Head Circumference --      Peak Flow --      Pain Score 04/19/18 1438 0     Pain Loc --      Pain Edu? --      Excl. in [place]? --     Constitutional: Alert and oriented. Well appearing and in no acute distress.  When I walk in the room patient is dozing [place] 91 when I talk to her she wakes up sats up to 95 Eyes: Conjunctivae are normal.  Head: Atraumatic. Nose: No congestion/rhinnorhea. Mouth/Throat: Mucous membranes are moist.  Oropharynx non-erythematous. Neck: No stridor.  Cardiovascular: Normal rate, regular rhythm. Grossly normal heart sounds.  Good peripheral circulation. Respiratory: Normal respiratory effort.  No retractions. Lungs tattered crackles.  Patient begins to cough while I am listening to her Gastrointestinal: Soft and nontender. No distention. No abdominal bruits. No CVA tenderness. Musculoskeletal: No lower extremity tenderness 1+ edema bilaterally. Neurologic:  Normal speech and language. No gross focal neurologic deficits are appreciated. Skin:  Skin is warm, dry and intact. No rash noted.   ____________________________________________   LABS (all labs ordered are listed, but only abnormal results are displayed)  Labs Reviewed  BASIC METABOLIC PANEL - Abnormal; Notable for the following components:      Result Value   Sodium 130 (*)    Chloride 92 (*)    Glucose, Bld 547 (*)    BUN 36 (*)    Creatinine, Ser 1.96 (*)    Calcium 8.3 (*)    GFR calc non Af Amer 23 (*)    GFR calc Af Amer 27 (*)    All other components within normal limits  CBC - Abnormal; Notable for the following components:   WBC 11.3 (*)    All other components within normal limits  URINALYSIS, COMPLETE (UACMP) WITH  MICROSCOPIC - Abnormal; Notable for the following components:   Color, Urine  YELLOW (*)    APPearance CLOUDY (*)    Glucose, UA >=500 (*)    Hgb urine dipstick SMALL (*)    Protein, ur 100 (*)    Leukocytes,Ua LARGE (*)    All other components within normal limits  GLUCOSE, CAPILLARY - Abnormal; Notable for the following components:   Glucose-Capillary 475 (*)    All other components within normal limits  GLUCOSE, CAPILLARY - Abnormal; Notable for the following components:   Glucose-Capillary 462 (*)    All other components within normal limits  GLUCOSE, CAPILLARY - Abnormal; Notable for the following components:   Glucose-Capillary 462 (*)    All other components within normal limits  GLUCOSE, CAPILLARY - Abnormal; Notable for the following components:   Glucose-Capillary 317 (*)    All other components within normal limits  URINE CULTURE  CBG MONITORING, ED   ____________________________________________  EKG   ____________________________________________  RADIOLOGY  ED MD interpretation:   Official radiology report(s): Dg Chest Port 1 View  Result Date: 04/19/2018 CLINICAL DATA:  Hyperglycemia today.  Cough. EXAM: PORTABLE CHEST 1 VIEW COMPARISON:  Single-view of the chest 03/27/2018. PA and lateral chest 03/08/2018. FINDINGS: Lung volumes are somewhat low but the lungs are clear. Heart size is upper normal. Aortic atherosclerosis is noted. No pneumothorax or pleural effusion. No acute bony abnormality. Dysplastic right second rib is unchanged. IMPRESSION: No acute disease. Electronically Signed   By: Inge Rise M.D.   On: 04/19/2018 15:46    ____________________________________________   PROCEDURES  Procedure(s) performed (including Critical Care):  Procedures   ____________________________________________   INITIAL IMPRESSION / ASSESSMENT AND PLAN / ED COURSE Patient doing better.  We will treat her UTI that should help keep her sugar down as well.  We will let her go home in an attempt to keep her from catching any coronavirus.   He has missed her evening dose of blood pressure medication which she will get when she returns to the nursing home.  This should help her blood pressure.               ____________________________________________   FINAL CLINICAL IMPRESSION(S) / ED DIAGNOSES  Final diagnoses:  Hyperglycemia  Urinary tract infection without hematuria, site unspecified     ED Discharge Orders         Ordered    cephALEXin (KEFLEX) 500 MG capsule  4 times daily     04/19/18 1834           Note:  This document was prepared using Dragon voice recognition software and may include unintentional dictation errors.    Nena Polio, MD 04/19/18 [phone]

## 2018-04-19 NOTE — ED Triage Notes (Signed)
Pt arrives via ems from [facility] assisted [facility]. EMS report pt CBG 505. Facility reported cbg taken this am and was in 400's but not reported at that time, next staff rechecked cbg and was >500. Pt a&o x 4 on arrival. NAD noted at this time.

## 2018-04-20 LAB — URINE CULTURE

## 2018-04-21 ENCOUNTER — Emergency Department
Admission: EM | Admit: 2018-04-21 | Discharge: 2018-04-22 | Disposition: A | Discharge status: Home / Self Care | Payer: PPO | Attending: Emergency Medicine | Admitting: Emergency Medicine

## 2018-04-21 ENCOUNTER — Other Ambulatory Visit: Payer: Self-pay

## 2018-04-21 ENCOUNTER — Emergency Department: Payer: PPO

## 2018-04-21 DIAGNOSIS — I5032 Chronic diastolic (congestive) heart failure: Secondary | ICD-10-CM | POA: Diagnosis not present

## 2018-04-21 DIAGNOSIS — J069 Acute upper respiratory infection, unspecified: Secondary | ICD-10-CM | POA: Insufficient documentation

## 2018-04-21 DIAGNOSIS — N183 Chronic kidney disease, stage 3 (moderate): Secondary | ICD-10-CM | POA: Diagnosis not present

## 2018-04-21 DIAGNOSIS — E119 Type 2 diabetes mellitus without complications: Secondary | ICD-10-CM | POA: Diagnosis not present

## 2018-04-21 DIAGNOSIS — I10 Essential (primary) hypertension: Secondary | ICD-10-CM | POA: Diagnosis not present

## 2018-04-21 DIAGNOSIS — D124 Benign neoplasm of descending colon: Secondary | ICD-10-CM | POA: Diagnosis not present

## 2018-04-21 DIAGNOSIS — K621 Rectal polyp: Secondary | ICD-10-CM | POA: Diagnosis not present

## 2018-04-21 DIAGNOSIS — Z86010 Personal history of colonic polyps: Secondary | ICD-10-CM | POA: Diagnosis not present

## 2018-04-21 DIAGNOSIS — E039 Hypothyroidism, unspecified: Secondary | ICD-10-CM | POA: Insufficient documentation

## 2018-04-21 DIAGNOSIS — E1165 Type 2 diabetes mellitus with hyperglycemia: Secondary | ICD-10-CM | POA: Diagnosis not present

## 2018-04-21 DIAGNOSIS — Z7984 Long term (current) use of oral hypoglycemic drugs: Secondary | ICD-10-CM | POA: Insufficient documentation

## 2018-04-21 DIAGNOSIS — R001 Bradycardia, unspecified: Secondary | ICD-10-CM | POA: Diagnosis not present

## 2018-04-21 DIAGNOSIS — R05 Cough: Secondary | ICD-10-CM | POA: Diagnosis not present

## 2018-04-21 DIAGNOSIS — I25119 Atherosclerotic heart disease of native coronary artery with unspecified angina pectoris: Secondary | ICD-10-CM | POA: Diagnosis not present

## 2018-04-21 DIAGNOSIS — N760 Acute vaginitis: Secondary | ICD-10-CM | POA: Diagnosis not present

## 2018-04-21 DIAGNOSIS — Z209 Contact with and (suspected) exposure to unspecified communicable disease: Secondary | ICD-10-CM | POA: Diagnosis not present

## 2018-04-21 DIAGNOSIS — I130 Hypertensive heart and chronic kidney disease with heart failure and stage 1 through stage 4 chronic kidney disease, or unspecified chronic kidney disease: Secondary | ICD-10-CM | POA: Insufficient documentation

## 2018-04-21 DIAGNOSIS — Z79899 Other long term (current) drug therapy: Secondary | ICD-10-CM | POA: Diagnosis not present

## 2018-04-21 DIAGNOSIS — Z87891 Personal history of nicotine dependence: Secondary | ICD-10-CM | POA: Insufficient documentation

## 2018-04-21 DIAGNOSIS — D122 Benign neoplasm of ascending colon: Secondary | ICD-10-CM | POA: Diagnosis not present

## 2018-04-21 DIAGNOSIS — R0902 Hypoxemia: Secondary | ICD-10-CM | POA: Diagnosis not present

## 2018-04-21 DIAGNOSIS — E7800 Pure hypercholesterolemia, unspecified: Secondary | ICD-10-CM | POA: Diagnosis not present

## 2018-04-21 LAB — CBC
HCT: 37.6 % (ref 36.0–46.0)
Hemoglobin: 12 g/dL (ref 12.0–15.0)
MCH: 28.6 pg (ref 26.0–34.0)
MCHC: 31.9 g/dL (ref 30.0–36.0)
MCV: 89.5 fL (ref 80.0–100.0)
Platelets: 182 10*3/uL (ref 150–400)
RBC: 4.2 MIL/uL (ref 3.87–5.11)
RDW: 13.3 % (ref 11.5–15.5)
WBC: 11.6 10*3/uL — ABNORMAL HIGH (ref 4.0–10.5)
nRBC: 0 % (ref 0.0–0.2)

## 2018-04-21 LAB — COMPREHENSIVE METABOLIC PANEL
ALT: 17 U/L (ref 0–44)
AST: 30 U/L (ref 15–41)
Albumin: 2.9 g/dL — ABNORMAL LOW (ref 3.5–5.0)
Alkaline Phosphatase: 102 U/L (ref 38–126)
Anion gap: 12 (ref 5–15)
BUN: 31 mg/dL — ABNORMAL HIGH (ref 8–23)
CO2: 25 mmol/L (ref 22–32)
Calcium: 7.8 mg/dL — ABNORMAL LOW (ref 8.9–10.3)
Chloride: 91 mmol/L — ABNORMAL LOW (ref 98–111)
Creatinine, Ser: 1.7 mg/dL — ABNORMAL HIGH (ref 0.44–1.00)
GFR calc Af Amer: 32 mL/min — ABNORMAL LOW (ref >60)
GFR calc non Af Amer: 28 mL/min — ABNORMAL LOW (ref >60)
Glucose, Bld: 381 mg/dL — ABNORMAL HIGH (ref 70–99)
Potassium: 3.6 mmol/L (ref 3.5–5.1)
Sodium: 128 mmol/L — ABNORMAL LOW (ref 135–145)
Total Bilirubin: 0.7 mg/dL (ref 0.3–1.2)
Total Protein: 5.5 g/dL — ABNORMAL LOW (ref 6.5–8.1)

## 2018-04-21 LAB — TROPONIN I: Troponin I: <0.03 ng/mL (ref <0.03)

## 2018-04-21 LAB — GLUCOSE, CAPILLARY: Glucose-Capillary: 381 mg/dL — ABNORMAL HIGH (ref 70–99)

## 2018-04-21 MED ORDER — ACETAMINOPHEN 500 MG PO TABS
1000.0000 mg | ORAL_TABLET | Freq: Once | ORAL | Status: AC
  Administered 2018-04-21: 1000 mg via ORAL

## 2018-04-21 NOTE — ED Notes (Signed)
Patient son called [name] wanting update on Patient.

## 2018-04-21 NOTE — ED Provider Notes (Signed)
[facility] Emergency Department Provider Note  Time seen: 10:19 PM  I have reviewed the triage vital signs and the nursing notes.   HISTORY  Chief Complaint Cough   HPI Sharon [name] is a 85 y.o. female with a past medical history of anemia, atrial fibrillation, CHF, diabetes, hypertension, hyperlipidemia, presents to the emergency department for cough.  Patient lives at [facility], per report patient is currently being treated for urinary tract infection on antibiotics.  Patient began coughing yesterday and has had worsening cough today so they sent the patient to the emergency department for "corona testing."  Here the patient is awake and alert, she has no distress.  Does have an occasional cough on examination.  Denies any sputum production.  Denies any known fever.  No known sick contacts per patient.  No travel.  Reassuringly patient is satting 100% on room air and is afebrile this time.   Past Medical History:  Diagnosis Date  . Anemia   . Atrial fibrillation ([place])   . Cardiac arrest ([place])   . Cataract   . CHF (congestive heart failure) ([place])   . Diabetes mellitus without complication ([place])   . Edema    feet/ankles occas  . Hip fracture ([place])   . Hyperlipidemia   . Hypertension   . Hypothyroid   . Neuropathy   . Osteomyelitis ([place])    left first metatarsal  . Stroke [facility])     Patient Active Problem List   Diagnosis Date Noted  . Lymphedema 03/24/2018  . Pressure injury of skin 03/12/2018  . Atherosclerosis of native arteries of the extremities with ulceration ([place]) 05/07/2017  . Non-healing ulcer ([place]) 04/16/2017  . Diabetic neuropathy ([place]) 04/07/2017  . Septic shock ([place]) 03/20/2017  . CKD (chronic kidney disease), stage III ([place]) 03/19/2017  . Goals of care, counseling/discussion 03/03/2017  . PAD (peripheral artery disease) ([name]) 02/03/2017  . Abdominal aortic stenosis 02/03/2017  . Bilateral lower extremity edema  02/03/2017  . Anemia 01/29/2017  . B12 deficiency 01/29/2017  . Pancreatic mass 01/29/2017  . Hyponatremia 07/07/2015  . Foot ulcer ([place]) 02/07/2015  . Acute on chronic renal failure ([place]) 11/05/2014  . Diabetic foot infection ([place]) 10/10/2014  . Chronic diastolic heart failure ([place]) 05/31/2014  . HTN (hypertension) 05/31/2014  . DM (diabetes mellitus), type 2, uncontrolled ([place]) 05/31/2014    Past Surgical History:  Procedure Laterality Date  . ACHILLES TENDON SURGERY Left 02/08/2015   Procedure: ACHILLES LENGTHENING/KIDNER;  Surgeon: Albertine Patricia, DPM;  Location: [facility] ORS;  Service: Podiatry;  Laterality: Left;  . APPENDECTOMY    . CARDIAC CATHETERIZATION  08/25/13  . CATARACT EXTRACTION W/PHACO Left 07/20/2014   Procedure: CATARACT EXTRACTION PHACO AND INTRAOCULAR LENS PLACEMENT (IOC);  Surgeon: Leandrew Koyanagi, MD;  Location: [facility] ORS;  Service: Ophthalmology;  Laterality: Left;  [place]  1:18                 AP     23.6             CDE   9.69      lot #[phone]  . CHOLECYSTECTOMY    . CORONARY ANGIOPLASTY    . HALLUX VALGUS AKIN Left 02/08/2015   Procedure: HALLUX VALGUS AKIN/ KELLER;  Surgeon: Albertine Patricia, DPM;  Location: [facility] ORS;  Service: Podiatry;  Laterality: Left;  IVA with Local needs 1 hour for this case   . HEMIARTHROPLASTY HIP Right   . HEMIARTHROPLASTY HIP Left   .  INCISION AND DRAINAGE Left 10/11/2014   Procedure: Removal of infected tibial sessmoid;  Surgeon: Albertine Patricia, DPM;  Location: [facility] OR[facility];  Service: Podiatry;  Laterality: Left;  . IRRIGATION AND DEBRIDEMENT FOOT Right 03/21/2017   Procedure: IRRIGATION AND DEBRIDEMENT FOOT right great toe amputation;  Surgeon: Sharlotte Alamo, DPM;  Location: [facility] OR[facility];  Service: Podiatry;  Laterality: Right;  . IRRIGATION AND DEBRIDEMENT FOOT Right 04/18/2017   Procedure: IRRIGATION AND DEBRIDEMENT FOOT and application wound vac;  Surgeon: Albertine Patricia, DPM;  Location: [facility];  Service: Podiatry;  Laterality: Right;   . PERIPHERAL VASCULAR BALLOON ANGIOPLASTY Right 04/17/2017   Procedure: PERIPHERAL VASCULAR BALLOON ANGIOPLASTY;  Surgeon: Katha Cabal, MD;  Location: [facility];  Service: Cardiovascular;  Laterality: Right;    Prior to Admission medications   Medication Sig Start Date End Date Taking? Authorizing Provider  acetaminophen (TYLENOL) 500 MG tablet Take 1,000 mg by mouth at bedtime. For pain management    [provider]  acetaminophen (TYLENOL) 650 MG CR tablet Take 650 mg by mouth every 8 (eight) hours as needed. For discomfort    [provider]  Amino Acids-Protein Hydrolys (FEEDING SUPPLEMENT, PRO-STAT SUGAR FREE 64,) LIQD Take 30 mLs by mouth 2 (two) times daily between meals.    [provider]  amiodarone (PACERONE) 200 MG tablet Take 1 tablet (200 mg total) by mouth daily. 03/24/17   Gladstone Lighter, MD  amitriptyline (ELAVIL) 10 MG tablet Take 10 mg by mouth daily.     [provider]  atorvastatin (LIPITOR) 10 MG tablet Take 10 mg by mouth daily at 10 pm.     [provider]  cephALEXin (KEFLEX) 250 MG capsule Take 1 capsule (250 mg total) by mouth 3 (three) times daily. 03/27/18   Paulette Blanch, MD  cephALEXin (KEFLEX) 500 MG capsule Take 1 capsule (500 mg total) by mouth 4 (four) times daily for 10 days. 04/19/18 04/29/18  Nena Polio, MD  cholecalciferol (VITAMIN D) 1000 units tablet Take 1,000 Units by mouth daily.    [provider]  collagenase (SANTYL) ointment Apply topically daily. 03/12/18   Stark Jock Jude, MD  ferrous sulfate 325 (65 FE) MG tablet Take 325 mg by mouth daily.     [provider]  furosemide (LASIX) 20 MG tablet  03/15/18   [provider]  gabapentin (NEURONTIN) 100 MG capsule Take 2 capsules (200 mg total) by mouth 3 (three) times daily. 03/28/18 03/28/19  Rusty Aus, MD  glimepiride (AMARYL) 4 MG tablet Take 4 mg by mouth daily.     [provider]  GLUCERNA  (GLUCERNA) LIQD Take 237 mLs by mouth 2 (two) times daily between meals.    [provider]  JANUVIA 50 MG tablet Take 50 mg by mouth daily. Take along with Amaryl 02/15/18   [provider]  levothyroxine (SYNTHROID, LEVOTHROID) 112 MCG tablet Take 112 mcg by mouth daily before breakfast. 30 minutes before breakfast    [provider]  metoprolol tartrate (LOPRESSOR) 25 MG tablet Take 1 tablet (25 mg total) by mouth 2 (two) times daily. 04/21/17   Saundra Shelling, MD  mupirocin ointment (BACTROBAN) 2 % Place 1 application into the nose 2 (two) times daily.    [provider]  traMADol (ULTRAM) 50 MG tablet Take 1 tablet (50 mg total) by mouth every 6 (six) hours as needed for moderate pain. 03/27/18   Paulette Blanch, MD  traZODone (DESYREL) 50 MG tablet  Take 50 mg by mouth at bedtime. For anxiety 06/24/17 06/24/18  [provider]  vitamin C (ASCORBIC ACID) 500 MG tablet Take 500 mg by mouth daily.     [provider]    Allergies  Allergen Reactions  . Carbamazepine Other (See Comments)    Reaction: unknown  . Cymbalta [Duloxetine Hcl] Other (See Comments)    Confusion, disorientation  . Duloxetine     Other reaction(s): Other (See Comments) Confusion, disorientation  . Lyrica [Pregabalin] Other (See Comments)    Patient and son states this makes the patient confused.  . Temazepam     Other reaction(s): Hallucination    Family History  Problem Relation Age of Onset  . Heart attack Mother     Social History Social History   Tobacco Use  . Smoking status: Former Smoker    Packs/day: 0.50    Years: 7.00    Pack years: 3.50    Types: Cigarettes    Last attempt to quit: 05/30/1989    Years since quitting: 28.9  . Smokeless tobacco: Never Used  Substance Use Topics  . Alcohol use: No    Alcohol/week: 0.0 standard drinks  . Drug use: No    Review of Systems Constitutional: Negative for fever. Cardiovascular: Negative for chest  pain. Respiratory: Negative shortness of breath.  Positive for cough. Gastrointestinal: Negative for abdominal pain Musculoskeletal: Negative for musculoskeletal complaints Neurological: Negative for headache All other ROS negative  ____________________________________________   PHYSICAL EXAM:  VITAL SIGNS: ED Triage Vitals [04/21/18 2201]  Enc Vitals Group     BP      Pulse Rate 60     Resp      Temp 97.9 F (36.6 C)     Temp Source Oral     SpO2 100 %     Weight      Height      Head Circumference      Peak Flow      Pain Score      Pain Loc      Pain Edu?      Excl. in [place]?     Constitutional: Patient is awake and alert, no acute distress. Eyes: Normal exam ENT   Head: Normocephalic and atraumatic   Mouth/Throat: Mucous membranes are moist. Cardiovascular: Normal rate, regular rhythm.  Respiratory: Normal respiratory effort without tachypnea nor retractions. Breath sounds are clear.  No obvious wheeze rales or rhonchi.  Occasional cough during examination especially with deep inspiration. Gastrointestinal: Soft and nontender. No distention.  Musculoskeletal: Nontender with normal range of motion in all extremities. No lower extremity tenderness Neurologic:  Normal speech and language. No gross focal neurologic deficits Skin:  Skin is warm, dry and intact.  Psychiatric: Mood and affect are normal.   ____________________________________________      RADIOLOGY  X-ray is negative.  ____________________________________________   INITIAL IMPRESSION / ASSESSMENT AND PLAN / ED COURSE  Pertinent labs & imaging results that were available during my care of the patient were reviewed by me and considered in my medical decision making (see chart for details).  Patient presents to the emergency department for cough referred from her nursing facility.  No known fever.  Patient does have occasional cough during examination sound somewhat wet.  Denies any sputum  production however.  We will check labs as well as a chest x-ray and continue to closely monitor.  Reassuringly patient is satting 100% on room air, and is afebrile.  Patient's x-ray is negative.  Sodium is slightly low at 128 although when corrected for glucose is greater than 130.  Patient's white blood cell count is 11.6.  Has a dry cough, but an overall negative chest x-ray.  Afebrile does not currently meet corona testing criteria as set by the Department of Health.  Patient will be discharged back to her nursing facility at this time.   Sharon [name] was evaluated in Emergency Department on 04/21/2018 for the symptoms described in the history of present illness. She was evaluated in the context of the global COVID-19 pandemic, which necessitated consideration that the patient might be at risk for infection with the SARS-CoV-2 virus that causes COVID-19. Institutional protocols and algorithms that pertain to the evaluation of patients at risk for COVID-19 are in a state of rapid change based on information released by regulatory bodies including the CDC and federal and state organizations. These policies and algorithms were followed during the patient's care in the ED.   ____________________________________________   FINAL CLINICAL IMPRESSION(S) / ED DIAGNOSES  Cough Upper respiratory infection   Harvest Dark, MD 04/21/18 2351

## 2018-04-21 NOTE — ED Triage Notes (Signed)
Pt BIB [facility] from [facility]. EMS reports pt currently being treated for a UTI and developed a cough yesterday, facility requesting COVID testing.Pt has been afebrile. Blood sugar in the 400s per EMS.

## 2018-04-22 DIAGNOSIS — N3090 Cystitis, unspecified without hematuria: Secondary | ICD-10-CM | POA: Diagnosis not present

## 2018-04-22 DIAGNOSIS — R279 Unspecified lack of coordination: Secondary | ICD-10-CM | POA: Diagnosis not present

## 2018-04-22 DIAGNOSIS — D649 Anemia, unspecified: Secondary | ICD-10-CM | POA: Diagnosis not present

## 2018-04-22 DIAGNOSIS — R531 Weakness: Secondary | ICD-10-CM | POA: Diagnosis not present

## 2018-04-22 DIAGNOSIS — E1165 Type 2 diabetes mellitus with hyperglycemia: Secondary | ICD-10-CM | POA: Diagnosis not present

## 2018-04-22 DIAGNOSIS — E871 Hypo-osmolality and hyponatremia: Secondary | ICD-10-CM | POA: Diagnosis not present

## 2018-04-22 DIAGNOSIS — J157 Pneumonia due to Mycoplasma pneumoniae: Secondary | ICD-10-CM | POA: Diagnosis not present

## 2018-04-22 DIAGNOSIS — J40 Bronchitis, not specified as acute or chronic: Secondary | ICD-10-CM | POA: Diagnosis not present

## 2018-04-22 DIAGNOSIS — N184 Chronic kidney disease, stage 4 (severe): Secondary | ICD-10-CM | POA: Diagnosis not present

## 2018-04-22 DIAGNOSIS — Z743 Need for continuous supervision: Secondary | ICD-10-CM | POA: Diagnosis not present

## 2018-04-22 DIAGNOSIS — Z0000 Encounter for general adult medical examination without abnormal findings: Secondary | ICD-10-CM | POA: Diagnosis not present

## 2018-04-22 DIAGNOSIS — I6523 Occlusion and stenosis of bilateral carotid arteries: Secondary | ICD-10-CM | POA: Diagnosis not present

## 2018-04-22 MED ORDER — ACETAMINOPHEN 500 MG PO TABS
ORAL_TABLET | ORAL | Status: AC
  Administered 2018-04-21: 1000 mg via ORAL
  Filled 2018-04-22: qty 2

## 2018-04-22 NOTE — ED Notes (Signed)
Report called to [place] at [facility].

## 2018-04-22 NOTE — ED Notes (Signed)
Spoke with Sharon [name] to inform patient was returning to facility. This Probation officer explained patient will need to see her PCP for medication adjustments on her blood sugar medications and educated with infections can cause increased blood sugar readings.

## 2018-04-23 DIAGNOSIS — Z20828 Contact with and (suspected) exposure to other viral communicable diseases: Secondary | ICD-10-CM | POA: Diagnosis not present

## 2018-04-23 DIAGNOSIS — I890 Lymphedema, not elsewhere classified: Secondary | ICD-10-CM | POA: Diagnosis not present

## 2018-04-25 ENCOUNTER — Emergency Department: Payer: PPO

## 2018-04-25 ENCOUNTER — Emergency Department
Admission: EM | Admit: 2018-04-25 | Discharge: 2018-04-25 | Disposition: A | Discharge status: Skilled Nursing Facility | Payer: PPO | Attending: Emergency Medicine | Admitting: Emergency Medicine

## 2018-04-25 ENCOUNTER — Other Ambulatory Visit: Payer: Self-pay

## 2018-04-25 ENCOUNTER — Encounter: Payer: Self-pay | Admitting: Physician Assistant

## 2018-04-25 DIAGNOSIS — R531 Weakness: Secondary | ICD-10-CM | POA: Diagnosis not present

## 2018-04-25 DIAGNOSIS — R0902 Hypoxemia: Secondary | ICD-10-CM | POA: Diagnosis not present

## 2018-04-25 DIAGNOSIS — R1084 Generalized abdominal pain: Secondary | ICD-10-CM | POA: Diagnosis not present

## 2018-04-25 DIAGNOSIS — Z743 Need for continuous supervision: Secondary | ICD-10-CM | POA: Diagnosis not present

## 2018-04-25 DIAGNOSIS — R1030 Lower abdominal pain, unspecified: Secondary | ICD-10-CM | POA: Diagnosis not present

## 2018-04-25 DIAGNOSIS — I10 Essential (primary) hypertension: Secondary | ICD-10-CM | POA: Diagnosis not present

## 2018-04-25 DIAGNOSIS — Z79899 Other long term (current) drug therapy: Secondary | ICD-10-CM | POA: Diagnosis not present

## 2018-04-25 DIAGNOSIS — I130 Hypertensive heart and chronic kidney disease with heart failure and stage 1 through stage 4 chronic kidney disease, or unspecified chronic kidney disease: Secondary | ICD-10-CM | POA: Diagnosis not present

## 2018-04-25 DIAGNOSIS — N183 Chronic kidney disease, stage 3 (moderate): Secondary | ICD-10-CM | POA: Insufficient documentation

## 2018-04-25 DIAGNOSIS — I5032 Chronic diastolic (congestive) heart failure: Secondary | ICD-10-CM | POA: Diagnosis not present

## 2018-04-25 DIAGNOSIS — I959 Hypotension, unspecified: Secondary | ICD-10-CM | POA: Diagnosis not present

## 2018-04-25 DIAGNOSIS — E1122 Type 2 diabetes mellitus with diabetic chronic kidney disease: Secondary | ICD-10-CM | POA: Diagnosis not present

## 2018-04-25 DIAGNOSIS — Z87891 Personal history of nicotine dependence: Secondary | ICD-10-CM | POA: Diagnosis not present

## 2018-04-25 DIAGNOSIS — K5900 Constipation, unspecified: Secondary | ICD-10-CM | POA: Insufficient documentation

## 2018-04-25 DIAGNOSIS — Z794 Long term (current) use of insulin: Secondary | ICD-10-CM | POA: Insufficient documentation

## 2018-04-25 DIAGNOSIS — E1165 Type 2 diabetes mellitus with hyperglycemia: Secondary | ICD-10-CM | POA: Diagnosis not present

## 2018-04-25 DIAGNOSIS — R52 Pain, unspecified: Secondary | ICD-10-CM | POA: Diagnosis not present

## 2018-04-25 DIAGNOSIS — E039 Hypothyroidism, unspecified: Secondary | ICD-10-CM | POA: Insufficient documentation

## 2018-04-25 DIAGNOSIS — E1140 Type 2 diabetes mellitus with diabetic neuropathy, unspecified: Secondary | ICD-10-CM | POA: Diagnosis not present

## 2018-04-25 DIAGNOSIS — N3090 Cystitis, unspecified without hematuria: Secondary | ICD-10-CM | POA: Insufficient documentation

## 2018-04-25 DIAGNOSIS — Z7984 Long term (current) use of oral hypoglycemic drugs: Secondary | ICD-10-CM | POA: Diagnosis not present

## 2018-04-25 DIAGNOSIS — R279 Unspecified lack of coordination: Secondary | ICD-10-CM | POA: Diagnosis not present

## 2018-04-25 LAB — COMPREHENSIVE METABOLIC PANEL
ALT: 16 U/L (ref 0–44)
AST: 26 U/L (ref 15–41)
Albumin: 3.3 g/dL — ABNORMAL LOW (ref 3.5–5.0)
Alkaline Phosphatase: 110 U/L (ref 38–126)
Anion gap: 14 (ref 5–15)
BUN: 26 mg/dL — ABNORMAL HIGH (ref 8–23)
CO2: 28 mmol/L (ref 22–32)
Calcium: 8.7 mg/dL — ABNORMAL LOW (ref 8.9–10.3)
Chloride: 89 mmol/L — ABNORMAL LOW (ref 98–111)
Creatinine, Ser: 1.52 mg/dL — ABNORMAL HIGH (ref 0.44–1.00)
GFR calc Af Amer: 37 mL/min — ABNORMAL LOW (ref >60)
GFR calc non Af Amer: 32 mL/min — ABNORMAL LOW (ref >60)
Glucose, Bld: 225 mg/dL — ABNORMAL HIGH (ref 70–99)
Potassium: 3.7 mmol/L (ref 3.5–5.1)
Sodium: 131 mmol/L — ABNORMAL LOW (ref 135–145)
Total Bilirubin: 0.6 mg/dL (ref 0.3–1.2)
Total Protein: 7.1 g/dL (ref 6.5–8.1)

## 2018-04-25 LAB — CBC WITH DIFFERENTIAL/PLATELET
Abs Immature Granulocytes: 0.11 10*3/uL — ABNORMAL HIGH (ref 0.00–0.07)
Basophils Absolute: 0 10*3/uL (ref 0.0–0.1)
Basophils Relative: 0 %
Eosinophils Absolute: 0.3 10*3/uL (ref 0.0–0.5)
Eosinophils Relative: 2 %
HCT: 40.5 % (ref 36.0–46.0)
Hemoglobin: 13.1 g/dL (ref 12.0–15.0)
Immature Granulocytes: 1 %
Lymphocytes Relative: 15 %
Lymphs Abs: 1.8 10*3/uL (ref 0.7–4.0)
MCH: 28.5 pg (ref 26.0–34.0)
MCHC: 32.3 g/dL (ref 30.0–36.0)
MCV: 88.2 fL (ref 80.0–100.0)
Monocytes Absolute: 0.9 10*3/uL (ref 0.1–1.0)
Monocytes Relative: 7 %
Neutro Abs: 9 10*3/uL — ABNORMAL HIGH (ref 1.7–7.7)
Neutrophils Relative %: 75 %
Platelets: 252 10*3/uL (ref 150–400)
RBC: 4.59 MIL/uL (ref 3.87–5.11)
RDW: 13.3 % (ref 11.5–15.5)
WBC: 12 10*3/uL — ABNORMAL HIGH (ref 4.0–10.5)
nRBC: 0 % (ref 0.0–0.2)

## 2018-04-25 LAB — URINALYSIS, COMPLETE (UACMP) WITH MICROSCOPIC
Bilirubin Urine: NEGATIVE
Glucose, UA: 150 mg/dL — AB
Hgb urine dipstick: NEGATIVE
Ketones, ur: NEGATIVE mg/dL
Nitrite: POSITIVE — AB
Protein, ur: 100 mg/dL — AB
Specific Gravity, Urine: 1.011 (ref 1.005–1.030)
pH: 6 (ref 5.0–8.0)

## 2018-04-25 LAB — GLUCOSE, CAPILLARY: Glucose-Capillary: 218 mg/dL — ABNORMAL HIGH (ref 70–99)

## 2018-04-25 MED ORDER — CEPHALEXIN 500 MG PO CAPS: 500.0000 mg | ORAL_CAPSULE | Freq: Three times a day (TID) | ORAL | 0 refills | Status: DC

## 2018-04-25 MED ORDER — IOHEXOL 240 MG/ML SOLN
25.0000 mL | INTRAMUSCULAR | Status: AC
  Administered 2018-04-25: 14:00:00 25 mL via ORAL

## 2018-04-25 MED ORDER — CEPHALEXIN 250 MG PO CAPS
250.0000 mg | ORAL_CAPSULE | Freq: Once | ORAL | Status: AC
  Administered 2018-04-25: 18:00:00 250 mg via ORAL
  Filled 2018-04-25: qty 1

## 2018-04-25 MED ORDER — POLYETHYLENE GLYCOL 3350 17 G PO PACK
17.0000 g | PACK | Freq: Every day | ORAL | Status: DC
  Administered 2018-04-25: 17 g via ORAL
  Filled 2018-04-25: qty 1

## 2018-04-25 MED ORDER — TRAMADOL HCL 50 MG PO TABS
50.0000 mg | ORAL_TABLET | Freq: Once | ORAL | Status: AC
  Administered 2018-04-25: 18:00:00 50 mg via ORAL
  Filled 2018-04-25: qty 1

## 2018-04-25 NOTE — ED Triage Notes (Signed)
Pt sent in by [facility] for hyperglycemia that they said they couldn't bring down with insulin bs 96 for ems. Also c/o abd pain/ bloating and is unsure when her last bm was.

## 2018-04-25 NOTE — Discharge Instructions (Addendum)
Give the prescription antibiotic as directed. Give Miralax daily and with juice or water and increase bisacodyl to daily dosing to promote regular stools. Offer high fiber foods to promote softened stools. Urine culture is pending. Follow-up with the primary provider as needed.

## 2018-04-25 NOTE — ED Provider Notes (Addendum)
-----------------------------------------   2:02 PM on 04/25/2018 -----------------------------------------   Seen in conjunction with the PA, please see the PAs note for further information.  Briefly, she has been seen several times recently for various complaints.  She was evaluated here twice in the last few days.  She is been treated for recent UTI.  She has a history of chronic renal insufficiency, history of CHF, or fibrillation.  Patient apparently is here today because her blood sugars were elevated at the place of which she states.  However, they were not significantly elevated here.  In addition, patient states that she had a little bit of lower abdominal discomfort which she is no longer having to any significant extent her.  She does state that she has been constipated.  Her belly is nonsurgical.  Work-up is otherwise unremarkable in terms of her exam.  She does have a chronic appearing edema.  But the area of concern for her is her lower abdomen which is certainly not acutely tender.  However, she is 85 years old and sometimes abdominal pathology can be difficult to discern without further work-up so we will obtain CT and blood work and reassess.  She also will give [place] a urine sample.  ----------------------------------------- 3:31 PM on 04/25/2018 -----------------------------------------  Signed out to dr. Jacqualine Code at the end of my shift.   Schuyler Amor, MD 04/25/18 1405    Schuyler Amor, MD 04/25/18 1531

## 2018-04-25 NOTE — ED Provider Notes (Signed)
[facility] Provider Note ____________________________________________  Time seen: 1337  I have reviewed the triage vital signs and the nursing notes.  HISTORY  Chief Complaint  Hyperglycemia and Abdominal Pain  HPI Sharon [name] is a 85 y.o. female presents to the ED via EMS from [place]. The report from [facility] is hyperglycemia and abdominal pain. She was found to have a BS of 254 mg/dl at 1155. She was given her 4 units of insulin as prescribed. The patient is complaining of lower abdominal pain. She describes bloating without nausea or vomiting. She is unclear of her last BM. She endorses a history of chronic constipation. Her PMH is significant for HTN, DM, CKD, and lymphedema.   Past Medical History:  Diagnosis Date  . Anemia   . Atrial fibrillation ([place])   . Cardiac arrest ([place])   . Cataract   . CHF (congestive heart failure) ([place])   . Diabetes mellitus without complication ([place])   . Edema    feet/ankles occas  . Hip fracture ([place])   . Hyperlipidemia   . Hypertension   . Hypothyroid   . Neuropathy   . Osteomyelitis ([place])    left first metatarsal  . Stroke [facility])     Patient Active Problem List   Diagnosis Date Noted  . Lymphedema 03/24/2018  . Pressure injury of skin 03/12/2018  . Atherosclerosis of native arteries of the extremities with ulceration ([place]) 05/07/2017  . Non-healing ulcer ([place]) 04/16/2017  . Diabetic neuropathy ([place]) 04/07/2017  . Septic shock ([place]) 03/20/2017  . CKD (chronic kidney disease), stage III ([place]) 03/19/2017  . Goals of care, counseling/discussion 03/03/2017  . PAD (peripheral artery disease) ([place]) 02/03/2017  . Abdominal aortic stenosis 02/03/2017  . Bilateral lower extremity edema 02/03/2017  . Anemia 01/29/2017  . B12 deficiency 01/29/2017  . Pancreatic mass 01/29/2017  . Hyponatremia 07/07/2015  . Foot ulcer ([place]) 02/07/2015  . Acute on chronic renal failure ([place])  11/05/2014  . Diabetic foot infection ([place]) 10/10/2014  . Chronic diastolic heart failure ([place]) 05/31/2014  . HTN (hypertension) 05/31/2014  . DM (diabetes mellitus), type 2, uncontrolled ([place]) 05/31/2014    Past Surgical History:  Procedure Laterality Date  . ACHILLES TENDON SURGERY Left 02/08/2015   Procedure: ACHILLES LENGTHENING/KIDNER;  Surgeon: Albertine Patricia, DPM;  Location: [facility] ORS;  Service: Podiatry;  Laterality: Left;  . APPENDECTOMY    . CARDIAC CATHETERIZATION  08/25/13  . CATARACT EXTRACTION W/PHACO Left 07/20/2014   Procedure: CATARACT EXTRACTION PHACO AND INTRAOCULAR LENS PLACEMENT (IOC);  Surgeon: Leandrew Koyanagi, MD;  Location: [facility] ORS;  Service: Ophthalmology;  Laterality: Left;  [place]  1:18                 AP     23.6             CDE   9.69      lot #[phone]  . CHOLECYSTECTOMY    . CORONARY ANGIOPLASTY    . HALLUX VALGUS AKIN Left 02/08/2015   Procedure: HALLUX VALGUS AKIN/ KELLER;  Surgeon: Albertine Patricia, DPM;  Location: [facility] ORS;  Service: Podiatry;  Laterality: Left;  IVA with Local needs 1 hour for this case   . HEMIARTHROPLASTY HIP Right   . HEMIARTHROPLASTY HIP Left   . INCISION AND DRAINAGE Left 10/11/2014   Procedure: Removal of infected tibial sessmoid;  Surgeon: Albertine Patricia, DPM;  Location: [facility] ORS;  Service: Podiatry;  Laterality: Left;  . IRRIGATION AND DEBRIDEMENT FOOT Right  03/21/2017   Procedure: IRRIGATION AND DEBRIDEMENT FOOT right great toe amputation;  Surgeon: Sharlotte Alamo, DPM;  Location: [facility] ORS;  Service: Podiatry;  Laterality: Right;  . IRRIGATION AND DEBRIDEMENT FOOT Right 04/18/2017   Procedure: IRRIGATION AND DEBRIDEMENT FOOT and application wound vac;  Surgeon: Albertine Patricia, DPM;  Location: [facility] ORS;  Service: Podiatry;  Laterality: Right;  . PERIPHERAL VASCULAR BALLOON ANGIOPLASTY Right 04/17/2017   Procedure: PERIPHERAL VASCULAR BALLOON ANGIOPLASTY;  Surgeon: Katha Cabal, MD;  Location: [facility];  Service:  Cardiovascular;  Laterality: Right;    Prior to Admission medications   Medication Sig Start Date End Date Taking? Authorizing Provider  acetaminophen (TYLENOL) 500 MG tablet Take 1,000 mg by mouth at bedtime. For pain management    [provider]  acetaminophen (TYLENOL) 650 MG CR tablet Take 650 mg by mouth every 8 (eight) hours as needed. For discomfort    [provider]  Amino Acids-Protein Hydrolys (FEEDING SUPPLEMENT, PRO-STAT SUGAR FREE 64,) LIQD Take 30 mLs by mouth 2 (two) times daily between meals.    [provider]  amiodarone (PACERONE) 200 MG tablet Take 1 tablet (200 mg total) by mouth daily. 03/24/17   Gladstone Lighter, MD  amitriptyline (ELAVIL) 10 MG tablet Take 10 mg by mouth daily.     [provider]  atorvastatin (LIPITOR) 10 MG tablet Take 10 mg by mouth daily at 10 pm.     [provider]  cephALEXin (KEFLEX) 250 MG capsule Take 1 capsule (250 mg total) by mouth 3 (three) times daily. 03/27/18   Paulette Blanch, MD  cephALEXin (KEFLEX) 500 MG capsule Take 1 capsule (500 mg total) by mouth 4 (four) times daily for 10 days. 04/19/18 04/29/18  Nena Polio, MD  cholecalciferol (VITAMIN D) 1000 units tablet Take 1,000 Units by mouth daily.    [provider]  collagenase (SANTYL) ointment Apply topically daily. 03/12/18   Stark Jock Jude, MD  ferrous sulfate 325 (65 FE) MG tablet Take 325 mg by mouth daily.     [provider]  furosemide (LASIX) 20 MG tablet  03/15/18   [provider]  gabapentin (NEURONTIN) 100 MG capsule Take 2 capsules (200 mg total) by mouth 3 (three) times daily. 03/28/18 03/28/19  Rusty Aus, MD  glimepiride (AMARYL) 4 MG tablet Take 4 mg by mouth daily.     [provider]  GLUCERNA (GLUCERNA) LIQD Take 237 mLs by mouth 2 (two) times daily between meals.    [provider]  JANUVIA 50 MG tablet Take 50 mg by mouth daily. Take along with Amaryl 02/15/18   [provider]  levothyroxine (SYNTHROID, LEVOTHROID) 112 MCG tablet Take 112 mcg by mouth daily before breakfast. 30 minutes before breakfast    [provider]  metoprolol tartrate (LOPRESSOR) 25 MG tablet Take 1 tablet (25 mg total) by mouth 2 (two) times daily. 04/21/17   Saundra Shelling, MD  mupirocin ointment (BACTROBAN) 2 % Place 1 application into the nose 2 (two) times daily.    [provider]  traMADol (ULTRAM) 50 MG tablet Take 1 tablet (50 mg total) by mouth every 6 (six) hours as needed for moderate pain. 03/27/18   Paulette Blanch, MD  traZODone (DESYREL) 50 MG tablet Take 50 mg by mouth at bedtime. For anxiety 06/24/17 06/24/18  [provider]  vitamin C (ASCORBIC ACID) 500 MG tablet Take 500 mg by mouth daily.     [provider]    Allergies Carbamazepine; Cymbalta [duloxetine hcl]; Duloxetine; Lyrica [pregabalin]; and Temazepam  Family History  Problem Relation Age of Onset  . Heart attack Mother     Social History Social History   Tobacco Use  . Smoking status: Former Smoker    Packs/day: 0.50    Years: 7.00    Pack years: 3.50    Types: Cigarettes    Last attempt to quit: 05/30/1989    Years since quitting: 28.9  . Smokeless tobacco: Never Used  Substance Use Topics  . Alcohol use: No    Alcohol/week: 0.0 standard drinks  . Drug use: No    Review of Systems  Constitutional: Negative for fever. Eyes: Negative for visual changes. ENT: Negative for sore throat. Cardiovascular: Negative for chest pain. Respiratory: Negative for shortness of breath. Gastrointestinal: Negative for vomiting and diarrhea. Positive for abdominal pain.  Genitourinary: Negative for dysuria. Musculoskeletal: Negative for back pain. Skin: Negative for rash. Neurological: Negative for headaches, focal weakness or numbness. ____________________________________________  PHYSICAL EXAM:  VITAL SIGNS: ED Triage Vitals  Enc Vitals Group     BP 04/25/18  1319 (!) 145/72     Pulse Rate 04/25/18 1319 (!) 52     Resp 04/25/18 1319 16     Temp 04/25/18 1319 97.8 F (36.6 C)     Temp Source 04/25/18 1319 Oral     SpO2 04/25/18 1319 98 %     Weight 04/25/18 1321 154 lb 5.2 oz (70 kg)     Height 04/25/18 1321 5\' 5"  (1.651 m)     Head Circumference --      Peak Flow --      Pain Score 04/25/18 1321 10     Pain Loc --      Pain Edu? --      Excl. in [place]? --     Constitutional: Alert and oriented. Well appearing and in no distress. Head: Normocephalic and atraumatic. Eyes: Conjunctivae are normal. Normal extraocular movements Nose: No congestion/rhinorrhea/epistaxis. Mouth/Throat: Mucous membranes are moist. Cardiovascular: Normal rate, regular rhythm. Normal distal pulses. Chronic bilateral LE edema noted. Respiratory: Normal respiratory effort. No wheezes/rales/rhonchi. Gastrointestinal: Soft and mildly tender to palp along the lower quadrants. No distention, rebound, guarding, or organomegaly. Normoactive bowel sounds noted.  Musculoskeletal: Nontender with normal range of motion in all extremities.  Neurologic:  CN II-XII grossly intact. Normal speech and language. No gross focal neurologic deficits are appreciated. Skin:  Skin is warm, dry and intact. No rash noted. BLE compression coban wraps noted.  Psychiatric: Mood and affect are normal. Patient exhibits appropriate insight and judgment. ____________________________________________   LABS (pertinent positives/negatives) Labs Reviewed  GLUCOSE, CAPILLARY - Abnormal; Notable for the following components:      Result Value   Glucose-Capillary 218 (*)    All other components within normal limits  COMPREHENSIVE METABOLIC PANEL - Abnormal; Notable for the following components:   Sodium 131 (*)    Chloride 89 (*)    Glucose, Bld 225 (*)    BUN 26 (*)    Creatinine, Ser 1.52 (*)    Calcium 8.7 (*)    Albumin 3.3 (*)    GFR calc non Af Amer 32 (*)    GFR calc Af Amer 37 (*)    All  other components within normal limits  CBC WITH DIFFERENTIAL/PLATELET - Abnormal; Notable for the following components:   WBC 12.0 (*)    Neutro Abs 9.0 (*)    Abs Immature Granulocytes 0.11 (*)  All other components within normal limits  URINALYSIS, COMPLETE (UACMP) WITH MICROSCOPIC - Abnormal; Notable for the following components:   Color, Urine AMBER (*)    APPearance HAZY (*)    Glucose, UA 150 (*)    Protein, ur 100 (*)    Nitrite POSITIVE (*)    Leukocytes,Ua TRACE (*)    Bacteria, UA RARE (*)    All other components within normal limits  URINE CULTURE  ____________________________________________   RADIOLOGY  CT ABD/Pelvis w/o CM  IMPRESSION: 1. Mild right hydronephrosis and hydroureter. The dilated right ureter can be followed into the right side of the pelvis. I do not identify an obstructing distal ureteral calculus. The distal ureter and bladder is suboptimally imaged due to the metallic beam hardening streak artifact from the patient's BILATERAL hip prostheses. 2. 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 Atrophy of the pancreatic tail distal to the mass. Follow-up CT in 2 years is recommended in further evaluation. This recommendation follows ACR consensus guidelines: Management of Incidental Pancreatic Cysts: A White Paper of the ACR Incidental Findings Committee. J Am Coll Radiol 5465;68:127-517. 3. 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 4. Large colonic stool burden. _________________________________________  PROCEDURES  Procedures  Tramadol 50 mg PO Keflex 250 mg PO  Miralax 17 g PO ____________________________________________  INITIAL IMPRESSION / ASSESSMENT AND PLAN / ED COURSE  Geriatric patient with ED evaluation from her nursing facility with complaints of  hyperglycemia and lower abdominal pain.  Patient is otherwise stable during her course in the ED.  Her UA urinalysis reveals nitrite positive urine with moderate WBCs.  Her CT scan reveals no acute findings, but does indicate a large colonic stool burden.  This is probably the etiology of the patient's lower abdominal pain.  She will be given a dose of MiraLAX here in the ED.  She also be treated empirically with Keflex for uncomplicated cystitis.  Urine culture is pending. ____________________________________________  FINAL CLINICAL IMPRESSION(S) / ED DIAGNOSES  Final diagnoses:  Lower abdominal pain  Cystitis  Constipation, unspecified constipation type     Melvenia Needles, PA-C 04/25/18 1850    Schuyler Amor, MD 04/26/18 1646

## 2018-04-25 NOTE — ED Notes (Signed)
Report received. Pt is to be discharged back to [facility]. Medical necessity complete.

## 2018-04-26 LAB — URINE CULTURE: Special Requests: NORMAL

## 2018-04-27 ENCOUNTER — Encounter: Payer: PPO | Attending: Physician Assistant | Admitting: Physician Assistant

## 2018-04-27 ENCOUNTER — Other Ambulatory Visit: Payer: Self-pay

## 2018-04-27 DIAGNOSIS — I509 Heart failure, unspecified: Secondary | ICD-10-CM | POA: Diagnosis not present

## 2018-04-27 DIAGNOSIS — L89623 Pressure ulcer of left heel, stage 3: Secondary | ICD-10-CM | POA: Diagnosis not present

## 2018-04-27 DIAGNOSIS — E1140 Type 2 diabetes mellitus with diabetic neuropathy, unspecified: Secondary | ICD-10-CM | POA: Insufficient documentation

## 2018-04-27 DIAGNOSIS — N312 Flaccid neuropathic bladder, not elsewhere classified: Secondary | ICD-10-CM | POA: Diagnosis not present

## 2018-04-27 DIAGNOSIS — L89153 Pressure ulcer of sacral region, stage 3: Secondary | ICD-10-CM | POA: Insufficient documentation

## 2018-04-27 DIAGNOSIS — M069 Rheumatoid arthritis, unspecified: Secondary | ICD-10-CM | POA: Diagnosis not present

## 2018-04-27 DIAGNOSIS — I4891 Unspecified atrial fibrillation: Secondary | ICD-10-CM | POA: Diagnosis not present

## 2018-04-27 DIAGNOSIS — L97422 Non-pressure chronic ulcer of left heel and midfoot with fat layer exposed: Secondary | ICD-10-CM | POA: Diagnosis not present

## 2018-04-27 DIAGNOSIS — I890 Lymphedema, not elsewhere classified: Secondary | ICD-10-CM | POA: Insufficient documentation

## 2018-04-27 DIAGNOSIS — E11621 Type 2 diabetes mellitus with foot ulcer: Secondary | ICD-10-CM | POA: Diagnosis not present

## 2018-04-27 DIAGNOSIS — M25552 Pain in left hip: Secondary | ICD-10-CM | POA: Diagnosis not present

## 2018-04-27 DIAGNOSIS — Z87891 Personal history of nicotine dependence: Secondary | ICD-10-CM | POA: Diagnosis not present

## 2018-04-27 DIAGNOSIS — M545 Low back pain: Secondary | ICD-10-CM | POA: Diagnosis not present

## 2018-04-27 DIAGNOSIS — L97512 Non-pressure chronic ulcer of other part of right foot with fat layer exposed: Secondary | ICD-10-CM | POA: Diagnosis not present

## 2018-04-27 DIAGNOSIS — L97412 Non-pressure chronic ulcer of right heel and midfoot with fat layer exposed: Secondary | ICD-10-CM | POA: Insufficient documentation

## 2018-04-27 DIAGNOSIS — L97411 Non-pressure chronic ulcer of right heel and midfoot limited to breakdown of skin: Secondary | ICD-10-CM | POA: Diagnosis not present

## 2018-04-27 DIAGNOSIS — L89613 Pressure ulcer of right heel, stage 3: Secondary | ICD-10-CM | POA: Diagnosis not present

## 2018-04-27 DIAGNOSIS — L97822 Non-pressure chronic ulcer of other part of left lower leg with fat layer exposed: Secondary | ICD-10-CM | POA: Diagnosis not present

## 2018-04-27 DIAGNOSIS — L97812 Non-pressure chronic ulcer of other part of right lower leg with fat layer exposed: Secondary | ICD-10-CM | POA: Diagnosis not present

## 2018-04-27 DIAGNOSIS — Z09 Encounter for follow-up examination after completed treatment for conditions other than malignant neoplasm: Secondary | ICD-10-CM | POA: Diagnosis not present

## 2018-04-27 DIAGNOSIS — I110 Hypertensive heart disease with heart failure: Secondary | ICD-10-CM | POA: Insufficient documentation

## 2018-04-27 DIAGNOSIS — N1330 Unspecified hydronephrosis: Secondary | ICD-10-CM | POA: Diagnosis not present

## 2018-04-27 DIAGNOSIS — L97522 Non-pressure chronic ulcer of other part of left foot with fat layer exposed: Secondary | ICD-10-CM | POA: Diagnosis not present

## 2018-04-27 DIAGNOSIS — Z8249 Family history of ischemic heart disease and other diseases of the circulatory system: Secondary | ICD-10-CM | POA: Diagnosis not present

## 2018-04-27 DIAGNOSIS — E11622 Type 2 diabetes mellitus with other skin ulcer: Secondary | ICD-10-CM | POA: Insufficient documentation

## 2018-04-27 DIAGNOSIS — J188 Other pneumonia, unspecified organism: Secondary | ICD-10-CM | POA: Diagnosis not present

## 2018-04-27 DIAGNOSIS — K5909 Other constipation: Secondary | ICD-10-CM | POA: Diagnosis not present

## 2018-04-27 DIAGNOSIS — L97829 Non-pressure chronic ulcer of other part of left lower leg with unspecified severity: Secondary | ICD-10-CM | POA: Diagnosis not present

## 2018-04-27 DIAGNOSIS — L89629 Pressure ulcer of left heel, unspecified stage: Secondary | ICD-10-CM | POA: Diagnosis not present

## 2018-04-27 NOTE — Progress Notes (Signed)
ADRIEANA, FENNELLY ([id number]) Visit Report for 04/27/2018 Chief Complaint Document Details Patient Name: Foody, Eriana P. Date of Service: 04/27/2018 9:30 AM Medical Record Number: [id number] Patient Account Number: [id number] Date of Birth/Sex: 12-28-1933 (85 y.o. F) Treating RN: Montey Hora Primary Care Provider: Emily Filbert Other Clinician: Referring Provider: Emily Filbert Treating Provider/Extender: Melburn Hake, HOYT Weeks in Treatment: 5 Information Obtained from: Patient Chief Complaint Multiple bilateral LE ulcers Electronic Signature(s) Signed: 04/27/2018 1:21:29 PM By: Worthy Keeler PA-C Entered By: Worthy Keeler on 04/27/2018 10:29:14 Winkleman, Clarke P. ([id number]) -------------------------------------------------------------------------------- HPI Details Patient Name: Hott, Arantxa P. Date of Service: 04/27/2018 9:30 AM Medical Record Number: [id number] Patient Account Number: [id number] Date of Birth/Sex: April 29, 1933 (85 y.o. F) Treating RN: Montey Hora Primary Care Provider: Emily Filbert Other Clinician: Referring Provider: Emily Filbert Treating Provider/Extender: Melburn Hake, HOYT Weeks in Treatment: 5 History of Present Illness HPI Description: 03/23/18 On evaluation today patient presents for initial evaluation our clinic concerning issues that she's been having with the bilateral lower extremities. She has been out of the hospital for about a week and was there for about four days due to fluid overload. She does have congestive heart failure, atrial fibrillation, diabetes mellitus type II, and a right first toe amputation. Currently she is having bilateral lower extremity weeping and swelling. During her time in the hospital that she took her off of her diuretic medications according to what her son is telling me today. With that being said her primary care provider, Dr. Sabra Heck, has placed her back on some of these medications according to what her son is telling  me. She has undergone previous testing including hemoglobin A1c of 7.6 on 01/11/18. She's also had arterial studies which show that her right ABI was 0.89 and the left ABI of 0.94 this obviously is good as well. Currently the biggest concern with both the patient and her son are issues with overall general fluid overload and the fact that just wrapping her legs or managing the legs as we would do here in the clinic will not treat the overall fluid overload issue which I completely agree with. With that being said I think that we definitely need to try to get these areas closed as quickly as possible in order to prevent any chance for infection/cellulitis. 03/30/18 on evaluation today patient actually appears to be doing much better in regard to her lower extremity ulcers. She's been tolerating the dressing changes at this point without complication overall I do not see any signs of anything worsening which is good news. Nonetheless I am hopeful that she will continue to make good improvement she still has a lot of drainage we may want to increase her compression wrap to something a little bit stronger. 04/13/18 on evaluation today patient appears to be doing very well in regard to her lower extremity ulcers. The hill ulcers appear to be the worst but there still making good improvement overall in my pinion. Fortunately there's no evidence of active infection at this time in her swelling seems to be well controlled with the compression wrap. Overall very pleased in this regard. 04/27/18 on evaluation today patient actually appears to be doing better in regard to the majority for wounds on evaluation today. The one caveat is that her heels don't appear to be doing quite as well the left heel in particular is a little bit larger than previous. Fortunately there's no signs of active infection at this time. Overall she's been tolerating the dressing  changes without complication. I did discuss offloading of  the hills with her again today she has not been using a pillow under her legs to float the heels I think this would be of benefit for her. Electronic Signature(s) Signed: 04/27/2018 1:21:29 PM By: Worthy Keeler PA-C Entered By: Worthy Keeler on 04/27/2018 10:54:28 [place], [place] ([id number]) -------------------------------------------------------------------------------- Physical Exam Details Patient Name: Forsman, Armanii P. Date of Service: 04/27/2018 9:30 AM Medical Record Number: [id number] Patient Account Number: [id number] Date of Birth/Sex: [birth year] (85 y.o. F) Treating RN: Montey Hora Primary Care Provider: Emily Filbert Other Clinician: Referring Provider: Emily Filbert Treating Provider/Extender: Melburn Hake, HOYT Weeks in Treatment: 5 Constitutional Well-nourished and well-hydrated in no acute distress. Respiratory normal breathing without difficulty. clear to auscultation bilaterally. Cardiovascular regular rate and rhythm with normal S1, S2. Psychiatric this patient is able to make decisions and demonstrates good insight into disease process. Alert and Oriented x 3. pleasant and cooperative. Notes Patient's wound bed currently showed signs of good granulation at this time at most wound locations there does not appear to be any signs of active infection currently. No fevers, chills, nausea, or vomiting noted at this time. Electronic Signature(s) Signed: 04/27/2018 1:21:29 PM By: Worthy Keeler PA-C Entered By: Worthy Keeler on 04/27/2018 10:55:01 [place]ole, [place] ([id number]) -------------------------------------------------------------------------------- Physician Orders Details Patient Name: Shorts, Beola P. Date of Service: 04/27/2018 9:30 AM Medical Record Number: [id number] Patient Account Number: [id number] Date of Birth/Sex: [birth year] (85 y.o. F) Treating RN: Cornell Barman Primary Care Provider: Emily Filbert Other Clinician: Referring Provider: Emily Filbert Treating Provider/Extender: Melburn Hake, HOYT Weeks in Treatment: 5 Verbal / Phone Orders: No Diagnosis Coding Wound Cleansing Wound #1 Left,Dorsal Foot o Cleanse wound with mild soap and water Wound #2 Left,Dorsal Toe Second o Cleanse wound with mild soap and water Wound #6 Left Calcaneus o Cleanse wound with mild soap and water Wound #7 Right Calcaneus o Cleanse wound with mild soap and water Skin Barriers/Peri-Wound Care Wound #1 Left,Dorsal Foot o Moisturizing lotion Wound #2 Left,Dorsal Toe Second o Moisturizing lotion Wound #6 Left Calcaneus o Moisturizing lotion Wound #7 Right Calcaneus o Moisturizing lotion Primary Wound Dressing Wound #1 Left,Dorsal Foot o Silver Alginate Wound #2 Left,Dorsal Toe Second o Silver Alginate Wound #6 Left Calcaneus o Silver Alginate Wound #7 Right Calcaneus o Silver Alginate Secondary Dressing Wound #1 Left,Dorsal Foot o ABD pad Wound #6 Left Calcaneus Myung, Tierra P. ([id number]) o ABD pad Wound #7 Right Calcaneus o ABD pad Dressing Change Frequency Wound #1 Left,Dorsal Foot o Other: - twice weekly - Tuesdays and Fridays Wound #2 Left,Dorsal Toe Second o Other: - twice weekly - Tuesdays and Fridays Wound #6 Left Calcaneus o Other: - twice weekly - Tuesdays and Fridays Wound #7 Right Calcaneus o Other: - twice weekly - Tuesdays and Fridays Follow-up Appointments Wound #1 Left,Dorsal Foot o Return Appointment in 2 weeks. - HHRN to visit patient on Tuesdays and Fridays when patient does not come into [facility] and Fridays when she does come into clinic Wound #2 Left,Dorsal Toe Second o Return Appointment in 2 weeks. - HHRN to visit patient on Tuesdays and Fridays when patient does not come into [facility] and Fridays when she does come into clinic Wound #6 Left Calcaneus o Return Appointment in 2 weeks. - HHRN to visit patient on Tuesdays and  Fridays when patient does not come into [facility] and Fridays when she does come into  clinic Wound #7 Right Calcaneus o Return Appointment in 2 weeks. - HHRN to visit patient on Tuesdays and Fridays when patient does not come into [facility] and Fridays when she does come into clinic Edema Control Wound #6 Left Calcaneus o 3 Layer Compression System - Bilateral - Please wrap from toes to 3cm below the knee Wound #7 Right Calcaneus o 3 Layer Compression System - Bilateral - Please wrap from toes to 3cm below the knee [place] #1 [place] Visits - [place] Nurse may visit PRN to address patientos wound care needs. o FACE TO FACE ENCOUNTER: MEDICARE and MEDICAID PATIENTS: I certify that this patient is under my care and that I had a face-to-face encounter that meets the physician face-to-face encounter requirements with this patient on this date. The encounter with the patient was in whole or in part for the following MEDICAL CONDITION: (primary reason for [place]) MEDICAL NECESSITY: I certify, that based on my findings, NURSING services are a medically necessary home health service. HOME BOUND STATUS: I certify that my clinical findings support that this patient is homebound (i.e., Due to illness or injury, pt requires aid of supportive devices such as crutches, cane, wheelchairs, walkers, the use of special transportation or the assistance of another person to leave their place of residence. There is a normal inability to leave the home and doing so requires considerable and taxing effort. Other absences are for medical reasons / religious services and are infrequent or of short duration when for other reasons). Orsborn, Heide P. ([id number]) o If current dressing causes regression in wound condition, may D/C ordered dressing product/s and apply Normal Saline Moist Dressing daily until  next [place] / Other MD appointment. [place] of regression in wound condition at [phone]. o Please direct any NON-WOUND related issues/requests for orders to patient's Primary Care Physician Wound #2 [place] Visits - [place] Nurse may visit PRN to address patientos wound care needs. o FACE TO FACE ENCOUNTER: MEDICARE and MEDICAID PATIENTS: I certify that this patient is under my care and that I had a face-to-face encounter that meets the physician face-to-face encounter requirements with this patient on this date. The encounter with the patient was in whole or in part for the following MEDICAL CONDITION: (primary reason for [place]) MEDICAL NECESSITY: I certify, that based on my findings, NURSING services are a medically necessary home health service. HOME BOUND STATUS: I certify that my clinical findings support that this patient is homebound (i.e., Due to illness or injury, pt requires aid of supportive devices such as crutches, cane, wheelchairs, walkers, the use of special transportation or the assistance of another person to leave their place of residence. There is a normal inability to leave the home and doing so requires considerable and taxing effort. Other absences are for medical reasons / religious services and are infrequent or of short duration when for other reasons). o If current dressing causes regression in wound condition, may D/C ordered dressing product/s and apply Normal Saline Moist Dressing daily until next [place] / Other MD appointment. [place] of regression in wound condition at [phone]. o Please direct any NON-WOUND related issues/requests for orders to patient's Primary Care Physician Wound #6 Left Calcaneus o [place] Visits - [place] Nurse may visit PRN to address patientos wound care  needs. o FACE TO FACE ENCOUNTER: MEDICARE and MEDICAID PATIENTS: I certify that this patient is under my care and that I had a face-to-face encounter that meets the physician face-to-face encounter requirements with this patient on this date. The encounter with the patient was in whole or in part for the following MEDICAL CONDITION: (primary reason for [place]) MEDICAL NECESSITY: I certify, that based on my findings, NURSING services are a medically necessary home health service. HOME BOUND STATUS: I certify that my clinical findings support that this patient is homebound (i.e., Due to illness or injury, pt requires aid of supportive devices such as crutches, cane, wheelchairs, walkers, the use of special transportation or the assistance of another person to leave their place of residence. There is a normal inability to leave the home and doing so requires considerable and taxing effort. Other absences are for medical reasons / religious services and are infrequent or of short duration when for other reasons). o If current dressing causes regression in wound condition, may D/C ordered dressing product/s and apply Normal Saline Moist Dressing daily until next [place] / Other MD appointment. [place] of regression in wound condition at [phone]. o Please direct any NON-WOUND related issues/requests for orders to patient's Primary Care Physician Wound #7 Right Calcaneus o [place] [facility] - [place] Nurse may visit PRN to address patientos wound care needs. o FACE TO FACE ENCOUNTER: MEDICARE and MEDICAID PATIENTS: I certify that this patient is under my care and that I had a face-to-face encounter that meets the physician face-to-face encounter requirements with this patient on this date. The encounter with the patient was in whole or in part for the following MEDICAL CONDITION: (primary reason for [place])  MEDICAL NECESSITY: I certify, that based on my findings, NURSING services are a medically necessary home health service. HOME BOUND STATUS: I certify that my clinical findings support that this patient is homebound (i.e., Due to illness or injury, pt requires aid of supportive devices such as crutches, cane, wheelchairs, walkers, the use of special transportation or the assistance of another person to leave their place of residence. There is a normal inability to leave the home and doing so requires considerable and taxing effort. Other absences are for medical reasons / religious services and are infrequent or of short duration when for other reasons). o If current dressing causes regression in wound condition, may D/C ordered dressing product/s and apply Normal Saline Moist Dressing daily until next [place] / Other MD appointment. [place] of regression in wound condition at [phone]. o Please direct any NON-WOUND related issues/requests for orders to patient's Primary Care Physician DENEANE, STIFTER ([id number]) Electronic Signature(s) Signed: 04/27/2018 1:21:29 PM By: Worthy Keeler PA-C Signed: 04/27/2018 4:24:58 PM By: Gretta Cool, BSN, RN, CWS, Kim RN, BSN Entered By: Gretta Cool, BSN, RN, CWS, Kim on 04/27/2018 10:49:47 [place], [place] ([id number]) -------------------------------------------------------------------------------- Problem List Details Patient Name: Rakestraw, Kysa P. Date of Service: 04/27/2018 9:30 AM Medical Record Number: [id number] Patient Account Number: [id number] Date of Birth/Sex: May 15, 1933 (84 y.o. F) Treating RN: Montey Hora Primary Care Provider: Emily Filbert Other Clinician: Referring Provider: Emily Filbert Treating Provider/Extender: Melburn Hake, HOYT Weeks in Treatment: 5 Active Problems ICD-10 Evaluated Encounter Code Description Active Date Today Diagnosis E11.621 Type 2 diabetes mellitus with foot ulcer 03/23/2018 No  Yes I89.0 Lymphedema, not elsewhere classified 03/23/2018 No Yes L97.822 Non-pressure chronic ulcer of other part of left lower  leg with 03/23/2018 No Yes fat layer exposed L97.812 Non-pressure chronic ulcer of other part of right lower leg 03/23/2018 No Yes with fat layer exposed L97.512 Non-pressure chronic ulcer of other part of right foot with fat 03/23/2018 No Yes layer exposed L97.522 Non-pressure chronic ulcer of other part of left foot with fat 03/23/2018 No Yes layer exposed I73.89 Other specified peripheral vascular diseases 03/23/2018 No Yes Inactive Problems Resolved Problems Electronic Signature(s) Signed: 04/27/2018 1:21:29 PM By: Worthy Keeler PA-C Entered By: Worthy Keeler on 04/27/2018 10:29:06 [place], Shanyia P. ([id number]) Leugers, Sultana Mamie Nick ([id number]) -------------------------------------------------------------------------------- Progress Note Details Patient Name: Reaver, Landen P. Date of Service: 04/27/2018 9:30 AM Medical Record Number: [id number] Patient Account Number: [id number] Date of Birth/Sex: 03/25/1933 (84 y.o. F) Treating RN: Montey Hora Primary Care Provider: Emily Filbert Other Clinician: Referring Provider: Emily Filbert Treating Provider/Extender: Melburn Hake, HOYT Weeks in Treatment: 5 Subjective Chief Complaint Information obtained from Patient Multiple bilateral LE ulcers History of Present Illness (HPI) 03/23/18 On evaluation today patient presents for initial evaluation our clinic concerning issues that she's been having with the bilateral lower extremities. She has been out of the hospital for about a week and was there for about four days due to fluid overload. She does have congestive heart failure, atrial fibrillation, diabetes mellitus type II, and a right first toe amputation. Currently she is having bilateral lower extremity weeping and swelling. During her time in the hospital that she took her off of her diuretic medications  according to what her son is telling me today. With that being said her primary care provider, Dr. Sabra Heck, has placed her back on some of these medications according to what her son is telling me. She has undergone previous testing including hemoglobin A1c of 7.6 on 01/11/18. She's also had arterial studies which show that her right ABI was 0.89 and the left ABI of 0.94 this obviously is good as well. Currently the biggest concern with both the patient and her son are issues with overall general fluid overload and the fact that just wrapping her legs or managing the legs as we would do here in the clinic will not treat the overall fluid overload issue which I completely agree with. With that being said I think that we definitely need to try to get these areas closed as quickly as possible in order to prevent any chance for infection/cellulitis. 03/30/18 on evaluation today patient actually appears to be doing much better in regard to her lower extremity ulcers. She's been tolerating the dressing changes at this point without complication overall I do not see any signs of anything worsening which is good news. Nonetheless I am hopeful that she will continue to make good improvement she still has a lot of drainage we may want to increase her compression wrap to something a little bit stronger. 04/13/18 on evaluation today patient appears to be doing very well in regard to her lower extremity ulcers. The hill ulcers appear to be the worst but there still making good improvement overall in my pinion. Fortunately there's no evidence of active infection at this time in her swelling seems to be well controlled with the compression wrap. Overall very pleased in this regard. 04/27/18 on evaluation today patient actually appears to be doing better in regard to the majority for wounds on evaluation today. The one caveat is that her heels don't appear to be doing quite as well the left heel in particular is a  little bit larger than  previous. Fortunately there's no signs of active infection at this time. Overall she's been tolerating the dressing changes without complication. I did discuss offloading of the hills with her again today she has not been using a pillow under her legs to float the heels I think this would be of benefit for her. Patient History Information obtained from Patient. Family History Hypertension - Child,Mother, Stroke - Father, No family history of Cancer, Diabetes, Kidney Disease, Lung Disease, Seizures, Thyroid Problems, Tuberculosis. Social History Former smoker - 30 years ago, Marital Status - Widowed, Alcohol Use - Never, Drug Use - No History, Caffeine Use - Rarely. Medical History Eyes Redler, Annastasia P. ([id number]) Patient has history of Cataracts Denies history of Glaucoma, Optic Neuritis Ear/Nose/Mouth/Throat Denies history of Chronic sinus problems/congestion, Middle ear problems Hematologic/Lymphatic Patient has history of Anemia Denies history of Hemophilia, Human Immunodeficiency Virus, Lymphedema, Sickle Cell Disease Respiratory Denies history of Aspiration, Asthma, Chronic Obstructive Pulmonary Disease (COPD), Pneumothorax, Sleep Apnea, Tuberculosis Cardiovascular Patient has history of Arrhythmia - A. fib, Congestive Heart Failure, Hypertension Denies history of Angina, Hypotension, Myocardial Infarction, Peripheral Arterial Disease, Peripheral Venous Disease, Phlebitis, Vasculitis Gastrointestinal Denies history of Cirrhosis , Colitis, Crohn s, Hepatitis A, Hepatitis B, Hepatitis C Endocrine Patient has history of Type II Diabetes - oral medication Genitourinary Denies history of End Stage Renal Disease Immunological Denies history of Lupus Erythematosus, Raynaud s, Scleroderma Integumentary (Skin) Denies history of History of Burn, History of pressure wounds Musculoskeletal Patient has history of Rheumatoid Arthritis, Osteoarthritis Denies  history of Gout, Osteomyelitis Neurologic Patient has history of Neuropathy Denies history of Dementia, Quadriplegia, Paraplegia, Seizure Disorder Oncologic Denies history of Received Chemotherapy, Received Radiation Psychiatric Patient has history of Anorexia/bulimia, Confinement Anxiety Hospitalization/Surgery History - Fluid retention. Review of Systems (ROS) Constitutional Symptoms (General Health) Denies complaints or symptoms of Fatigue, Fever, Chills, Marked Weight Change. Respiratory Denies complaints or symptoms of Chronic or frequent coughs, Shortness of Breath. Cardiovascular Denies complaints or symptoms of Chest pain, LE edema. Psychiatric Denies complaints or symptoms of Anxiety, Claustrophobia. Objective Constitutional Well-nourished and well-hydrated in no acute distress. [place], Marabella P. ([id number]) Vitals Time Taken: 9:58 AM, Height: 65 in, Weight: 155 lbs, BMI: 25.8, Temperature: 97.8 F, Pulse: 59 bpm, Respiratory Rate: 16 breaths/min, Blood Pressure: 173/68 mmHg. Respiratory normal breathing without difficulty. clear to auscultation bilaterally. Cardiovascular regular rate and rhythm with normal S1, S2. Psychiatric this patient is able to make decisions and demonstrates good insight into disease process. Alert and Oriented x 3. pleasant and cooperative. General Notes: Patient's wound bed currently showed signs of good granulation at this time at most wound locations there does not appear to be any signs of active infection currently. No fevers, chills, nausea, or vomiting noted at this time. Integumentary (Hair, Skin) Wound #1 status is Open. Original cause of wound was Gradually Appeared. The wound is located on the Left,Dorsal Foot. The wound measures 0.4cm length x 0.3cm width x 0.1cm depth; 0.094cm^2 area and 0.009cm^3 volume. There is Fat Layer (Subcutaneous Tissue) Exposed exposed. There is no tunneling or undermining noted. There is a large amount of  serous drainage noted. The wound margin is indistinct and nonvisible. There is small (1-33%) pink granulation within the wound bed. There is a large (67-100%) amount of necrotic tissue within the wound bed including Adherent Slough. The periwound skin appearance exhibited: Induration, Dry/Scaly, Rubor. The periwound skin appearance did not exhibit: Callus, Crepitus, Excoriation, Rash, Scarring, Maceration, Atrophie Blanche, Cyanosis, Ecchymosis, Hemosiderin Staining, Mottled, Pallor, Erythema. Periwound temperature was  noted as No Abnormality. The periwound has tenderness on palpation. Wound #2 status is Open. Original cause of wound was Gradually Appeared. The wound is located on the Left,Dorsal Toe Second. The wound measures 0.4cm length x 0.3cm width x 0.2cm depth; 0.094cm^2 area and 0.019cm^3 volume. There is Fat Layer (Subcutaneous Tissue) Exposed exposed. There is no tunneling or undermining noted. There is a medium amount of serous drainage noted. The wound margin is flat and intact. There is small (1-33%) red granulation within the wound bed. There is a small (1-33%) amount of necrotic tissue within the wound bed including Adherent Slough. The periwound skin appearance exhibited: Rubor. The periwound skin appearance did not exhibit: Callus, Crepitus, Excoriation, Induration, Rash, Scarring, Dry/Scaly, Maceration, Atrophie Blanche, Cyanosis, Ecchymosis, Hemosiderin Staining, Mottled, Pallor, Erythema. Periwound temperature was noted as No Abnormality. The periwound has tenderness on palpation. Wound #3 status is Healed - Epithelialized. Original cause of wound was Gradually Appeared. The wound is located on the Left,Anterior Lower Leg. The wound measures 0cm length x 0cm width x 0cm depth; 0cm^2 area and 0cm^3 volume. Wound #6 status is Open. Original cause of wound was Gradually Appeared. The wound is located on the Left Calcaneus. The wound measures 2cm length x 1.5cm width x 0.4cm depth;  2.356cm^2 area and 0.942cm^3 volume. Wound #7 status is Open. Original cause of wound was Gradually Appeared. The wound is located on the Right Calcaneus. The wound measures 1cm length x 1cm width x 0.1cm depth; 0.785cm^2 area and 0.079cm^3 volume. The wound is limited to skin breakdown. There is no tunneling or undermining noted. There is a none present amount of drainage noted. The wound margin is distinct with the outline attached to the wound base. There is medium (34-66%) granulation within the wound bed. There is a medium (34-66%) amount of necrotic tissue within the wound bed including Eschar and Adherent Slough. The periwound skin appearance exhibited: Dry/Scaly. The periwound skin appearance did not exhibit: Callus, Crepitus, Excoriation, Induration, Rash, Scarring, Maceration, Atrophie Blanche, Cyanosis, Ecchymosis, Hemosiderin Staining, Mottled, Pallor, Rubor, Erythema. Periwound temperature was noted as No Abnormality. Assessment Burger, Ruthann P. ([id number]) Active Problems ICD-10 Type 2 diabetes mellitus with foot ulcer Lymphedema, not elsewhere classified Non-pressure chronic ulcer of other part of left lower leg with fat layer exposed Non-pressure chronic ulcer of other part of right lower leg with fat layer exposed Non-pressure chronic ulcer of other part of right foot with fat layer exposed Non-pressure chronic ulcer of other part of left foot with fat layer exposed Other specified peripheral vascular diseases Procedures Wound #6 Pre-procedure diagnosis of Wound #6 is a Pressure Ulcer located on the Left Calcaneus . There was a Four Layer Compression Therapy Procedure with a pre-treatment ABI of 0.9 by Cornell Barman, RN. Post procedure Diagnosis Wound #6: Same as Pre-Procedure Plan Wound Cleansing: Wound #1 Left,Dorsal Foot: Cleanse wound with mild soap and water Wound #2 Left,Dorsal Toe Second: Cleanse wound with mild soap and water Wound #6 Left Calcaneus: Cleanse  wound with mild soap and water Wound #7 Right Calcaneus: Cleanse wound with mild soap and water Skin Barriers/Peri-Wound Care: Wound #1 Left,Dorsal Foot: Moisturizing lotion Wound #2 Left,Dorsal Toe Second: Moisturizing lotion Wound #6 Left Calcaneus: Moisturizing lotion Wound #7 Right Calcaneus: Moisturizing lotion Primary Wound Dressing: Wound #1 Left,Dorsal Foot: Silver Alginate Wound #2 Left,Dorsal Toe Second: Silver Alginate Wound #6 Left Calcaneus: Silver Alginate Wound #7 Right Calcaneus: Silver Alginate Secondary Dressing: Abalos, Tyronda P. ([id number]) Wound #1 Left,Dorsal Foot: ABD pad Wound #6  Left Calcaneus: ABD pad Wound #7 Right Calcaneus: ABD pad Dressing Change Frequency: Wound #1 Left,Dorsal Foot: Other: - twice weekly - Tuesdays and Fridays Wound #2 Left,Dorsal Toe Second: Other: - twice weekly - Tuesdays and Fridays Wound #6 Left Calcaneus: Other: - twice weekly - Tuesdays and Fridays Wound #7 Right Calcaneus: Other: - twice weekly - Tuesdays and Fridays Follow-up Appointments: Wound #1 Left,Dorsal Foot: Return Appointment in 2 weeks. - HHRN to visit patient on Tuesdays and Fridays when patient does not come into [facility] and Fridays when she does come into clinic Wound #2 Left,Dorsal Toe Second: Return Appointment in 2 weeks. - HHRN to visit patient on Tuesdays and Fridays when patient does not come into [facility] and Fridays when she does come into clinic Wound #6 Left Calcaneus: Return Appointment in 2 weeks. - HHRN to visit patient on Tuesdays and Fridays when patient does not come into [facility] and Fridays when she does come into clinic Wound #7 Right Calcaneus: Return Appointment in 2 weeks. - HHRN to visit patient on Tuesdays and Fridays when patient does not come into [facility] and Fridays when she does come into clinic Edema Control: Wound #6 Left Calcaneus: 3 Layer  Compression System - Bilateral - Please wrap from toes to 3cm below the knee Wound #7 Right Calcaneus: 3 Layer Compression System - Bilateral - Please wrap from toes to 3cm below the knee Home Health: Wound #1 Left,Dorsal Foot: Continue Home Health Visits - [facility] Nurse may visit PRN to address patient s wound care needs. FACE TO FACE ENCOUNTER: MEDICARE and MEDICAID PATIENTS: I certify that this patient is under my care and that I had a face-to-face encounter that meets the physician face-to-face encounter requirements with this patient on this date. The encounter with the patient was in whole or in part for the following MEDICAL CONDITION: (primary reason for [place]) MEDICAL NECESSITY: I certify, that based on my findings, NURSING services are a medically necessary home health service. HOME BOUND STATUS: I certify that my clinical findings support that this patient is homebound (i.e., Due to illness or injury, pt requires aid of supportive devices such as crutches, cane, wheelchairs, walkers, the use of special transportation or the assistance of another person to leave their place of residence. There is a normal inability to leave the home and doing so requires considerable and taxing effort. Other absences are for medical reasons / religious services and are infrequent or of short duration when for other reasons). If current dressing causes regression in wound condition, may D/C ordered dressing product/s and apply Normal Saline Moist Dressing daily until next [place] / Other MD appointment. [place] of regression in wound condition at [phone]. Please direct any NON-WOUND related issues/requests for orders to patient's Primary Care Physician Wound #2 Left,Dorsal Toe Second: [place] Visits - [facility] Nurse may visit PRN to address patient s wound care needs. FACE TO FACE ENCOUNTER: MEDICARE and MEDICAID  PATIENTS: I certify that this patient is under my care and that I had a face-to-face encounter that meets the physician face-to-face encounter requirements with this patient on this date. The encounter with the patient was in whole or in part for the following MEDICAL CONDITION: (primary reason for [place]) MEDICAL NECESSITY: I certify, that based on my findings, NURSING services are a medically necessary home health service. HOME BOUND STATUS: I certify that  my clinical findings support that this patient is homebound (i.e., Due to illness or injury, pt requires aid of supportive devices such as crutches, cane, wheelchairs, walkers, the use of special transportation or the assistance of another person to leave their place of residence. There is a normal inability to leave the Rohde, Raphael P. ([id number]) home and doing so requires considerable and taxing effort. Other absences are for medical reasons / religious services and are infrequent or of short duration when for other reasons). If current dressing causes regression in wound condition, may D/C ordered dressing product/s and apply Normal Saline Moist Dressing daily until next [place] / Other MD appointment. [place] of regression in wound condition at [phone]. Please direct any NON-WOUND related issues/requests for orders to patient's Primary Care Physician Wound #6 Left Calcaneus: [place] Visits - [facility] Nurse may visit PRN to address patient s wound care needs. FACE TO FACE ENCOUNTER: MEDICARE and MEDICAID PATIENTS: I certify that this patient is under my care and that I had a face-to-face encounter that meets the physician face-to-face encounter requirements with this patient on this date. The encounter with the patient was in whole or in part for the following MEDICAL CONDITION: (primary reason for [place]) MEDICAL NECESSITY: I certify, that based on my  findings, NURSING services are a medically necessary home health service. HOME BOUND STATUS: I certify that my clinical findings support that this patient is homebound (i.e., Due to illness or injury, pt requires aid of supportive devices such as crutches, cane, wheelchairs, walkers, the use of special transportation or the assistance of another person to leave their place of residence. There is a normal inability to leave the home and doing so requires considerable and taxing effort. Other absences are for medical reasons / religious services and are infrequent or of short duration when for other reasons). If current dressing causes regression in wound condition, may D/C ordered dressing product/s and apply Normal Saline Moist Dressing daily until next [place] / Other MD appointment. [place] of regression in wound condition at [phone]. Please direct any NON-WOUND related issues/requests for orders to patient's Primary Care Physician Wound #7 Right Calcaneus: [place] Visits - [facility] Nurse may visit PRN to address patient s wound care needs. FACE TO FACE ENCOUNTER: MEDICARE and MEDICAID PATIENTS: I certify that this patient is under my care and that I had a face-to-face encounter that meets the physician face-to-face encounter requirements with this patient on this date. The encounter with the patient was in whole or in part for the following MEDICAL CONDITION: (primary reason for [place]) MEDICAL NECESSITY: I certify, that based on my findings, NURSING services are a medically necessary home health service. HOME BOUND STATUS: I certify that my clinical findings support that this patient is homebound (i.e., Due to illness or injury, pt requires aid of supportive devices such as crutches, cane, wheelchairs, walkers, the use of special transportation or the assistance of another person to leave their place of residence. There  is a normal inability to leave the home and doing so requires considerable and taxing effort. Other absences are for medical reasons / religious services and are infrequent or of short duration when for other reasons). If current dressing causes regression in wound condition, may D/C ordered dressing product/s and apply Normal Saline Moist Dressing daily until next [place] / Other MD appointment. [place] of regression  in wound condition at [phone]. Please direct any NON-WOUND related issues/requests for orders to patient's Primary Care Physician At this point my suggestion is gonna be that we go ahead and continue with the above wound care measures for the next week. I did strongly suggest this use a pillow underneath her legs whenever she is sitting in her chair in order to keep her heels from touching which should hopefully help the hills to heal more appropriately and quickly. She's in agreement with plan. Please see above for specific wound care orders. We will see patient for re-evaluation in 2 week(s) here in the clinic. If anything worsens or changes patient will contact our office for additional recommendations. Electronic Signature(s) Signed: 04/27/2018 1:21:29 PM By: Worthy Keeler PA-C Entered By: Worthy Keeler on 04/27/2018 11:00:02 [place], [place] ([id number]) -------------------------------------------------------------------------------- ROS/PFSH Details Patient Name: Blumstein, Davanna P. Date of Service: 04/27/2018 9:30 AM Medical Record Number: [id number] Patient Account Number: [id number] Date of Birth/Sex: [birth year] (84 y.o. F) Treating RN: Montey Hora Primary Care Provider: Emily Filbert Other Clinician: Referring Provider: Emily Filbert Treating Provider/Extender: Melburn Hake, HOYT Weeks in Treatment: 5 Information Obtained From Patient Constitutional Symptoms (General Health) Complaints and Symptoms: Negative for: Fatigue;  Fever; Chills; Marked Weight Change Respiratory Complaints and Symptoms: Negative for: Chronic or frequent coughs; Shortness of Breath Medical History: Negative for: Aspiration; Asthma; Chronic Obstructive Pulmonary Disease (COPD); Pneumothorax; Sleep Apnea; Tuberculosis Cardiovascular Complaints and Symptoms: Negative for: Chest pain; LE edema Medical History: Positive for: Arrhythmia - A. fib; Congestive Heart Failure; Hypertension Negative for: Angina; Hypotension; Myocardial Infarction; Peripheral Arterial Disease; Peripheral Venous Disease; Phlebitis; Vasculitis Psychiatric Complaints and Symptoms: Negative for: Anxiety; Claustrophobia Medical History: Positive for: Anorexia/bulimia; Confinement Anxiety Eyes Medical History: Positive for: Cataracts Negative for: Glaucoma; Optic Neuritis Ear/Nose/Mouth/Throat Medical History: Negative for: Chronic sinus problems/congestion; Middle ear problems Hematologic/Lymphatic Medical History: Positive for: Anemia Holbein, Amyjo P. ([id number]) Negative for: Hemophilia; Human Immunodeficiency Virus; Lymphedema; Sickle Cell Disease Gastrointestinal Medical History: Negative for: Cirrhosis ; Colitis; Crohnos; Hepatitis A; Hepatitis B; Hepatitis C Endocrine Medical History: Positive for: Type II Diabetes - oral medication Time with diabetes: 15 years Treated with: Oral agents Blood sugar tested every day: No Genitourinary Medical History: Negative for: End Stage Renal Disease Immunological Medical History: Negative for: Lupus Erythematosus; Raynaudos; Scleroderma Integumentary (Skin) Medical History: Negative for: History of Burn; History of pressure wounds Musculoskeletal Medical History: Positive for: Rheumatoid Arthritis; Osteoarthritis Negative for: Gout; Osteomyelitis Neurologic Medical History: Positive for: Neuropathy Negative for: Dementia; Quadriplegia; Paraplegia; Seizure Disorder Oncologic Medical  History: Negative for: Received Chemotherapy; Received Radiation HBO Extended History Items Eyes: Cataracts Immunizations Pneumococcal Vaccine: Received Pneumococcal Vaccination: Yes Implantable Devices Yes Kramp, Shakelia P. ([id number]) Hospitalization / Surgery History Type of Hospitalization/Surgery Fluid retention Family and Social History Cancer: No; Diabetes: No; Hypertension: Yes - Child,Mother; Kidney Disease: No; Lung Disease: No; Seizures: No; Stroke: Yes - Father; Thyroid Problems: No; Tuberculosis: No; Former smoker - 30 years ago; Marital Status - Widowed; Alcohol Use: Never; Drug Use: No History; Caffeine Use: Rarely; Financial Concerns: No; Food, Clothing or Shelter Needs: No; Support System Lacking: No; Transportation Concerns: No Physician Affirmation I have reviewed and agree with the above information. Electronic Signature(s) Signed: 04/27/2018 12:59:05 PM By: Montey Hora Signed: 04/27/2018 1:21:29 PM By: Worthy Keeler PA-C Entered By: Worthy Keeler on 04/27/2018 10:54:47 Hogg, Donnie P. ([id number]) -------------------------------------------------------------------------------- SuperBill Details Patient Name: Hulett, Silas P. Date of Service: 04/27/2018 Medical Record Number: [id number] Patient Account Number: [id number] Date of  Birth/Sex: 1933-02-12 (85 y.o. F) Treating RN: Montey Hora Primary Care Provider: Emily Filbert Other Clinician: Referring Provider: Emily Filbert Treating Provider/Extender: Melburn Hake, HOYT Weeks in Treatment: 5 Diagnosis Coding ICD-10 Codes Code Description E11.621 Type 2 diabetes mellitus with foot ulcer I89.0 Lymphedema, not elsewhere classified L97.822 Non-pressure chronic ulcer of other part of left lower leg with fat layer exposed L97.812 Non-pressure chronic ulcer of other part of right lower leg with fat layer exposed L97.512 Non-pressure chronic ulcer of other part of right foot with fat layer exposed L97.522  Non-pressure chronic ulcer of other part of left foot with fat layer exposed I73.89 Other specified peripheral vascular diseases Physician Procedures CPT4 Code Description: [id number] - WC PHYS LEVEL 4 - EST PT ICD-10 Diagnosis Description E11.621 Type 2 diabetes mellitus with foot ulcer I89.0 Lymphedema, not elsewhere classified L97.822 Non-pressure chronic ulcer of other part of left lower leg  with L97.812 Non-pressure chronic ulcer of other part of right lower leg with Modifier: fat layer expos fat layer expo Quantity: 1 ed sed Electronic Signature(s) Signed: 04/27/2018 1:21:29 PM By: Worthy Keeler PA-C Entered By: Worthy Keeler on 04/27/2018 11:00:18

## 2018-04-27 NOTE — Progress Notes (Signed)
MYRTLE, HALLER ([id number]) Visit Report for 04/27/2018 Arrival Information Details Patient Name: Sharon [name], Sharon [name]. Date of Service: 04/27/2018 9:30 AM Medical Record Number: [id number] Patient Account Number: [id number] Date of Birth/Sex: 1933/07/22 (85 y.o. F) Treating RN: Cornell Barman Primary Care Lilyahna Sirmon: Emily Filbert Other Clinician: Referring Amaura Authier: Emily Filbert Treating Pasty Manninen/Extender: Melburn Hake, HOYT Weeks in Treatment: 5 Visit Information History Since Last Visit Added or deleted any medications: No Patient Arrived: Ambulatory Any new allergies or adverse reactions: No Arrival Time: 09:57 Had a fall or experienced change in No Accompanied By: self activities of daily living that may affect Transfer Assistance: Manual risk of falls: Patient Identification Verified: Yes Signs or symptoms of abuse/neglect since last visito No Secondary Verification Process Yes Hospitalized since last visit: No Completed: Implantable device outside of the clinic excluding No Patient Has Alerts: Yes cellular tissue based products placed in the center Patient Alerts: ABI 06/29/17 L .94 since last visit: R .89 Has Dressing in Place as Prescribed: Yes Pain Present Now: No Electronic Signature(s) Signed: 04/27/2018 4:24:58 PM By: Gretta Cool, BSN, RN, CWS, Kim RN, BSN Entered By: Gretta Cool, BSN, RN, CWS, Kim on 04/27/2018 09:58:15 Sharon [name] ([id number]) -------------------------------------------------------------------------------- Compression Therapy Details Patient Name: [name], [name] Date of Service: 04/27/2018 9:30 AM Medical Record Number: [id number] Patient Account Number: [id number] Date of Birth/Sex: 01-17-1933 (85 y.o. F) Treating RN: Cornell Barman Primary Care Harshan Kearley: Emily Filbert Other Clinician: Referring Allysia Ingles: Emily Filbert Treating Lydian Chavous/Extender: Melburn Hake, HOYT Weeks in Treatment: 5 Compression Therapy Performed for Wound Assessment: Wound #6 Left  Calcaneus Performed By: Clinician Cornell Barman, RN Compression Type: Four Layer Pre Treatment ABI: 0.9 Post Procedure Diagnosis Same as Pre-procedure Electronic Signature(s) Signed: 04/27/2018 4:24:58 PM By: Gretta Cool, BSN, RN, CWS, Kim RN, BSN Entered By: Gretta Cool, BSN, RN, CWS, Kim on 04/27/2018 10:27:45 Whelpley, Garnett [name] ([id number]) -------------------------------------------------------------------------------- Encounter Discharge Information Details Patient Name: [name], [name] Date of Service: 04/27/2018 9:30 AM Medical Record Number: [id number] Patient Account Number: [id number] Date of Birth/Sex: 1933/04/15 (85 y.o. F) Treating RN: Cornell Barman Primary Care Elleana Stillson: Emily Filbert Other Clinician: Referring Daril Warga: Emily Filbert Treating Mateen Franssen/Extender: Melburn Hake, HOYT Weeks in Treatment: 5 Encounter Discharge Information Items Discharge Condition: Stable Ambulatory Status: Wheelchair Discharge Destination: Skilled Nursing Facility Telephoned: No Orders Sent: Yes Transportation: Private Auto Accompanied By: self Schedule Follow-up Appointment: Yes Clinical Summary of Care: Electronic Signature(s) Signed: 04/27/2018 4:24:58 PM By: Gretta Cool, BSN, RN, CWS, Kim RN, BSN Entered By: Gretta Cool, BSN, RN, CWS, Kim on 04/27/2018 12:29:21 Coutts, Garnett [name] ([id number]) -------------------------------------------------------------------------------- Lower Extremity Assessment Details Patient Name: [name], [name] Date of Service: 04/27/2018 9:30 AM Medical Record Number: [id number] Patient Account Number: [id number] Date of Birth/Sex: 1933/12/29 (85 y.o. F) Treating RN: Cornell Barman Primary Care Alera Quevedo: Emily Filbert Other Clinician: Referring Samar Venneman: Emily Filbert Treating Kenna Kirn/Extender: Melburn Hake, HOYT Weeks in Treatment: 5 Edema Assessment Assessed: [Left: No] [Right: No] Edema: [Left: No] [Right: No] Vascular Assessment Pulses: Dorsalis Pedis Palpable: [Left:Yes]  [Right:Yes] Electronic Signature(s) Signed: 04/27/2018 4:24:58 PM By: Gretta Cool, BSN, RN, CWS, Kim RN, BSN Entered By: Gretta Cool, BSN, RN, CWS, Kim on 04/27/2018 10:20:10 Luper, Garnett [name] ([id number]) -------------------------------------------------------------------------------- Multi Wound Chart Details Patient Name: [name], [name] Date of Service: 04/27/2018 9:30 AM Medical Record Number: [id number] Patient Account Number: [id number] Date of Birth/Sex: 1933-06-23 (85 y.o. 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 Secondary Etiology: N/A N/A N/A Comorbid History: Cataracts, Anemia, Cataracts, Anemia, N/A Arrhythmia, Congestive Heart Arrhythmia, Congestive Heart Failure, Hypertension, Type II Failure, Hypertension, Type II Diabetes, Rheumatoid Diabetes, Rheumatoid Arthritis, Osteoarthritis, Arthritis, Osteoarthritis, Neuropathy, Anorexia/bulimia, Neuropathy, Anorexia/bulimia, Confinement Anxiety Confinement Anxiety Date Acquired: 03/09/2018 03/09/2018 03/09/2018 Weeks of Treatment: 5 5 5  Wound Status: Open Open Healed - Epithelialized Clustered Wound: Yes No Yes Measurements L x W x D 0.4x0.3x0.1 0.4x0.3x0.2 0x0x0 (cm) Area (cm) : 0.094 0.094 0 Volume (cm) : 0.009 0.019 0 % Reduction in Area: 99.70% 57.30% 100.00% % Reduction in Volume: 99.70% 13.60% 100.00% Classification: Grade 2 Grade 1  Full Thickness Without Exposed Support Structures Exudate Amount: Large Medium N/A Exudate Type: Serous Serous N/A Exudate Color: amber amber N/A Wound Margin: Indistinct, nonvisible Flat and Intact N/A Granulation Amount: Small (1-33%) Small (1-33%) N/A Lafavor, Nashali P. ([id number]) Granulation Quality: Pink Red N/A Necrotic Amount: Large (67-100%) Small (1-33%) N/A Necrotic Tissue: Adherent Winifred N/A Exposed Structures: Fat Layer (Subcutaneous Fat Layer (Subcutaneous N/A Tissue) Exposed: Yes Tissue) Exposed: Yes Fascia: No Fascia: No Tendon: No Tendon: No Muscle: No Muscle: No Joint: No Joint: No Bone: No Bone: No Epithelialization: None None N/A Periwound Skin Texture: Induration: Yes Excoriation: No No Abnormalities Noted Excoriation: No Induration: No Callus: No Callus: No Crepitus: No Crepitus: No Rash: No Rash: No Scarring: No Scarring: No Periwound Skin Moisture: Dry/Scaly: Yes Maceration: No No Abnormalities Noted Maceration: No Dry/Scaly: No Periwound Skin Color: Rubor: Yes Rubor: Yes No Abnormalities Noted Atrophie Blanche: No Atrophie Blanche: No Cyanosis: No Cyanosis: No Ecchymosis: No Ecchymosis: No Erythema: No Erythema: No Hemosiderin Staining: No Hemosiderin Staining: No Mottled: No Mottled: No Pallor: No Pallor: No Temperature: No Abnormality No Abnormality N/A Tenderness on Palpation: Yes Yes No Wound Number: 6 7 N/A Photos: N/A Wound Location: Left Calcaneus Right Calcaneus N/A Wounding Event: Gradually Appeared Gradually Appeared N/A Primary Etiology: Pressure Ulcer Diabetic Wound/Ulcer of the N/A Lower Extremity Secondary Etiology: N/A Pressure Ulcer N/A Comorbid History: N/A Cataracts, Anemia, N/A Arrhythmia, Congestive Heart Failure, Hypertension, Type II Diabetes, Rheumatoid Arthritis, Osteoarthritis, Neuropathy, Anorexia/bulimia, Confinement Anxiety Date Acquired: 03/08/2018 03/09/2018 N/A Weeks of  Treatment: 5 5 N/A Wound Status: Open Open N/A Clustered Wound: No No N/A Measurements L x W x D 2x1.5x0.4 1x1x0.1 N/A (cm) Area (cm) : 2.356 0.785 N/A Blanks, Viktorya P. ([id number]) Volume (cm) : 0.942 0.079 N/A % Reduction in Area: -127.20% 95.20% N/A % Reduction in Volume: -355.10% 95.20% N/A Classification: Category/Stage II Grade 1 N/A Exudate Amount: N/A None Present N/A Exudate Type: N/A N/A N/A Exudate Color: N/A N/A N/A Wound Margin: N/A Distinct, outline attached N/A Granulation Amount: N/A Medium (34-66%) N/A Granulation Quality: N/A N/A N/A Necrotic Amount: N/A Medium (34-66%) N/A Necrotic Tissue: N/A Eschar, Adherent Slough N/A Exposed Structures: N/A Fascia: No N/A Fat Layer (Subcutaneous Tissue) Exposed: No Tendon: No Muscle: No Joint: No Bone: No Limited to Skin Breakdown Epithelialization: N/A Large (67-100%) N/A Periwound Skin Texture: No Abnormalities Noted Excoriation: No N/A Induration: No Callus: No Crepitus: No Rash: No Scarring: No Periwound Skin Moisture: No Abnormalities Noted Dry/Scaly: Yes N/A Maceration: No Periwound Skin Color: No Abnormalities Noted Atrophie Blanche: No N/A Cyanosis: No  Ecchymosis: No Erythema: No Hemosiderin Staining: No Mottled: No Pallor: No Rubor: No Temperature: N/A No Abnormality N/A Tenderness on Palpation: No No N/A Treatment Notes Electronic Signature(s) Signed: 04/27/2018 4:24:58 PM By: Gretta Cool, BSN, RN, CWS, Kim RN, BSN Entered By: Gretta Cool, BSN, RN, CWS, Kim on 04/27/2018 10:27:16 Alford, Garnett [name] ([id number]) -------------------------------------------------------------------------------- Multi-Disciplinary Care Plan Details Patient Name: Spoto, Amiaya P. Date of Service: 04/27/2018 9:30 AM Medical Record Number: [id number] Patient Account Number: [id number] Date of Birth/Sex: Mar 27, 1933 (85 y.o. F) Treating RN: Cornell Barman Primary Care Jakarius Flamenco: Emily Filbert Other Clinician: Referring Georgeana Oertel:  Emily Filbert Treating Athalene Kolle/Extender: Melburn Hake, HOYT Weeks in Treatment: 5 Active Inactive Abuse / Safety / Falls / Self Care Management Nursing Diagnoses: Potential for falls Goals: Patient will remain injury free related to falls Date Initiated: 03/23/2018 Target Resolution Date: 06/19/2018 Goal Status: Active Interventions: Assess fall risk on admission and as needed Notes: Orientation to the Wound Care Program Nursing Diagnoses: Knowledge deficit related to the wound healing center program Goals: Patient/caregiver will verbalize understanding of the [facility] Date Initiated: 03/23/2018 Target Resolution Date: 06/19/2018 Goal Status: Active Interventions: Provide education on orientation to the wound center Notes: Venous Leg Ulcer Nursing Diagnoses: Actual venous Insuffiency (use after diagnosis is confirmed) Goals: Patient will maintain optimal edema control Date Initiated: 03/23/2018 Target Resolution Date: 06/19/2018 Goal Status: Active Interventions: Assess peripheral edema status every visit. ALIVIYAH, MALANGA ([id number]) Compression as ordered Notes: Wound/Skin Impairment Nursing Diagnoses: Impaired tissue integrity Goals: Ulcer/skin breakdown will heal within 14 weeks Date Initiated: 03/23/2018 Target Resolution Date: 06/19/2018 Goal Status: Active Interventions: Assess patient/caregiver ability to obtain necessary supplies Assess patient/caregiver ability to perform ulcer/skin care regimen upon admission and as needed Assess ulceration(s) every visit Notes: Electronic Signature(s) Signed: 04/27/2018 4:24:58 PM By: Gretta Cool, BSN, RN, CWS, Kim RN, BSN Entered By: Gretta Cool, BSN, RN, CWS, Kim on 04/27/2018 10:22:50 Romanoski, Garnett [name] ([id number]) -------------------------------------------------------------------------------- Pain Assessment Details Patient Name: Zuluaga, Ondrea P. Date of Service: 04/27/2018 9:30 AM Medical Record Number:  [id number] Patient Account Number: [id number] Date of Birth/Sex: 15-Sep-1933 (85 y.o. F) Treating RN: Cornell Barman Primary Care Kolston Lacount: Emily Filbert Other Clinician: Referring Tomika Eckles: Emily Filbert Treating Estreya Clay/Extender: Melburn Hake, HOYT Weeks in Treatment: 5 Active Problems Location of Pain Severity and Description of Pain Patient Has Paino Yes Site Locations Pain Location: Generalized Pain Pain Management and Medication Current Pain Management: Notes Patient has pain in her lowerback. Electronic Signature(s) Signed: 04/27/2018 4:24:58 PM By: Gretta Cool, BSN, RN, CWS, Kim RN, BSN Entered By: Gretta Cool, BSN, RN, CWS, Kim on 04/27/2018 09:58:35 Mone, Garnett [name] ([id number]) -------------------------------------------------------------------------------- Wound Assessment Details Patient Name: Purtee, Kynnedi P. Date of Service: 04/27/2018 9:30 AM Medical Record Number: [id number] Patient Account Number: [id number] Date of Birth/Sex: January 25, 1933 (85 y.o. F) Treating RN: Cornell Barman Primary Care Roschelle Calandra: Emily Filbert Other Clinician: Referring Vannak Montenegro: Emily Filbert Treating Malvika Tung/Extender: Melburn Hake, HOYT Weeks in Treatment: 5 Wound Status Wound Number: 1 Primary Diabetic Wound/Ulcer of the Lower Extremity Etiology: Wound Location: Left Foot - Dorsal Wound Open Wounding Event: Gradually Appeared Status: Date Acquired: 03/09/2018 Comorbid Cataracts, Anemia, Arrhythmia, Congestive Heart Weeks Of Treatment: 5 History: Failure, Hypertension, Type II Diabetes, Clustered Wound: Yes Rheumatoid Arthritis, Osteoarthritis, Neuropathy, Anorexia/bulimia, Confinement Anxiety Photos Photo Uploaded By: Gretta Cool, BSN, RN, CWS, Kim on 04/27/2018 10:24:14 Wound Measurements Length: (cm) 0.4 % Reducti Width: (cm) 0.3 % Reducti Depth: (cm) 0.1 Epithelia Area: (cm) 0.094 Tunnelin Volume: (cm) 0.009 Undermin on in Area: 99.7% on in Volume: 99.7% lization: None g:  No ing: No Wound  Description Classification: Grade 2 Foul Odor Wound Margin: Indistinct, nonvisible Slough/Fi Exudate Amount: Large Exudate Type: Serous Exudate Color: amber After Cleansing: No brino Yes Wound Bed Granulation Amount: Small (1-33%) Exposed Structure Granulation Quality: Pink Fascia Exposed: No Necrotic Amount: Large (67-100%) Fat Layer (Subcutaneous Tissue) Exposed: Yes Necrotic Quality: Adherent Slough Tendon Exposed: No Muscle Exposed: No Joint Exposed: No Bone Exposed: No Mayer, Ayala P. ([id number]) Periwound Skin Texture Texture Color No Abnormalities Noted: No No Abnormalities Noted: No Callus: No Atrophie Blanche: No Crepitus: No Cyanosis: No Excoriation: No Ecchymosis: No Induration: Yes Erythema: No Rash: No Hemosiderin Staining: No Scarring: No Mottled: No Pallor: No Moisture Rubor: Yes No Abnormalities Noted: No Dry / Scaly: Yes Temperature / Pain Maceration: No Temperature: No Abnormality Tenderness on Palpation: Yes Treatment Notes Wound #1 (Left, Dorsal Foot) Notes SIlver Alginate, ABD, heel cup, 3 layer bilateral Electronic Signature(s) Signed: 04/27/2018 4:24:58 PM By: Gretta Cool, BSN, RN, CWS, Kim RN, BSN Entered By: Gretta Cool, BSN, RN, CWS, Kim on 04/27/2018 10:10:45 Cirigliano, Garnett [name] ([id number]) -------------------------------------------------------------------------------- Wound Assessment Details Patient Name: Bunnell, Carla P. Date of Service: 04/27/2018 9:30 AM Medical Record Number: [id number] Patient Account Number: [id number] Date of Birth/Sex: 1933/11/09 (85 y.o. F) Treating RN: Cornell Barman Primary Care Nate Common: Emily Filbert Other Clinician: Referring Susy Placzek: Emily Filbert Treating Chun Sellen/Extender: Melburn Hake, HOYT Weeks in Treatment: 5 Wound Status Wound Number: 2 Primary Diabetic Wound/Ulcer of the Lower Extremity Etiology: Wound Location: Left Toe Second - Dorsal Wound Open Wounding Event: Gradually Appeared Status: Date  Acquired: 03/09/2018 Comorbid Cataracts, Anemia, Arrhythmia, Congestive Heart Weeks Of Treatment: 5 History: Failure, Hypertension, Type II Diabetes, Clustered Wound: No Rheumatoid Arthritis, Osteoarthritis, Neuropathy, Anorexia/bulimia, Confinement Anxiety Photos Wound Measurements Length: (cm) 0.4 % Reduction in Width: (cm) 0.3 % Reduction in Depth: (cm) 0.2 Epithelializat Area: (cm) 0.094 Tunneling: Volume: (cm) 0.019 Undermining: Area: 57.3% Volume: 13.6% ion: None No No Wound Description Classification: Grade 1 Foul Odor Afte Wound Margin: Flat and Intact Slough/Fibrino Exudate Amount: Medium Exudate Type: Serous Exudate Color: amber r Cleansing: No Yes Wound Bed Granulation Amount: Small (1-33%) Exposed Structure Granulation Quality: Red Fascia Exposed: No Necrotic Amount: Small (1-33%) Fat Layer (Subcutaneous Tissue) Exposed: Yes Necrotic Quality: Adherent Slough Tendon Exposed: No Muscle Exposed: No Joint Exposed: No Bone Exposed: No Periwound Skin Texture Mongiello, Nikelle P. ([id number]) Texture Color No Abnormalities Noted: No No Abnormalities Noted: No Callus: No Atrophie Blanche: No Crepitus: No Cyanosis: No Excoriation: No Ecchymosis: No Induration: No Erythema: No Rash: No Hemosiderin Staining: No Scarring: No Mottled: No Pallor: No Moisture Rubor: Yes No Abnormalities Noted: No Dry / Scaly: No Temperature / Pain Maceration: No Temperature: No Abnormality Tenderness on Palpation: Yes Treatment Notes Wound #2 (Left, Dorsal Toe Second) Notes SIlver Alginate, ABD, heel cup, 3 layer bilateral Electronic Signature(s) Signed: 04/27/2018 4:24:58 PM By: Gretta Cool, BSN, RN, CWS, Kim RN, BSN Entered By: Gretta Cool, BSN, RN, CWS, Kim on 04/27/2018 10:13:20 Kichline, Garnett [name] ([id number]) -------------------------------------------------------------------------------- Wound Assessment Details Patient Name: Medlen, Ruhee P. Date of Service:  04/27/2018 9:30 AM Medical Record Number: [id number] Patient Account Number: [id number] Date of Birth/Sex: 04-05-1933 (85 y.o. F) Treating RN: Cornell Barman Primary Care Jamesrobert Ohanesian: Emily Filbert Other Clinician: Referring Alek Poncedeleon: Emily Filbert Treating Lynleigh Kovack/Extender: Melburn Hake, HOYT Weeks in Treatment: 5 Wound Status Wound Number: 3 Primary Etiology: Lymphedema Wound Location: Left, Anterior Lower Leg Wound Status: Healed - Epithelialized Wounding Event: Gradually Appeared Date Acquired: 03/09/2018 Weeks Of Treatment: 5 Clustered Wound: Yes Photos Photo Uploaded By:  Gretta Cool, BSN, RN, CWS, Kim on 04/27/2018 10:24:15 Wound Measurements Length: (cm) 0 Width: (cm) 0 Depth: (cm) 0 Area: (cm) 0 Volume: (cm) 0 % Reduction in Area: 100% % Reduction in Volume: 100% Wound Description Full Thickness Without Exposed Support Classification: Structures Periwound Skin Texture Texture Color No Abnormalities Noted: No No Abnormalities Noted: No Moisture No Abnormalities Noted: No Electronic Signature(s) Signed: 04/27/2018 4:24:58 PM By: Gretta Cool, BSN, RN, CWS, Kim RN, BSN Entered By: Gretta Cool, BSN, RN, CWS, Kim on 04/27/2018 10:14:07 Nauta, Tenisha P. ([id number]) -------------------------------------------------------------------------------- Wound Assessment Details Patient Name: Going, Candela P. Date of Service: 04/27/2018 9:30 AM Medical Record Number: [id number] Patient Account Number: [id number] Date of Birth/Sex: 1933-07-31 (85 y.o. F) Treating RN: Cornell Barman Primary Care Adon Gehlhausen: Emily Filbert Other Clinician: Referring Zakiah Gauthreaux: Emily Filbert Treating Chelesea Weiand/Extender: Melburn Hake, HOYT Weeks in Treatment: 5 Wound Status Wound Number: 6 Primary Etiology: Pressure Ulcer Wound Location: Left Calcaneus Wound Status: Open Wounding Event: Gradually Appeared Date Acquired: 03/08/2018 Weeks Of Treatment: 5 Clustered Wound: No Photos Photo Uploaded By: Gretta Cool, BSN, RN, CWS, Kim on  04/27/2018 10:24:41 Wound Measurements Length: (cm) 2 Width: (cm) 1.5 Depth: (cm) 0.4 Area: (cm) 2.356 Volume: (cm) 0.942 % Reduction in Area: -127.2% % Reduction in Volume: -355.1% Wound Description Classification: Category/Stage II Periwound Skin Texture Texture Color No Abnormalities Noted: No No Abnormalities Noted: No Moisture No Abnormalities Noted: No Treatment Notes Wound #6 (Left Calcaneus) Notes SIlver Alginate, ABD, heel cup, 3 layer bilateral Electronic Signature(s) Signed: 04/27/2018 4:24:58 PM By: Gretta Cool, BSN, RN, CWS, Kim RN, BSN Vonbehren, Washburn ([id number]) Entered By: Gretta Cool, BSN, RN, CWS, Kim on 04/27/2018 10:15:35 Pridmore, Alica P. ([id number]) -------------------------------------------------------------------------------- Wound Assessment Details Patient Name: Gannett, Mariadelaluz P. Date of Service: 04/27/2018 9:30 AM Medical Record Number: [id number] Patient Account Number: [id number] Date of Birth/Sex: 1933/11/05 (85 y.o. F) Treating RN: Cornell Barman Primary Care Ivalene Platte: Emily Filbert Other Clinician: Referring Amen Staszak: Emily Filbert Treating Markel Kurtenbach/Extender: Melburn Hake, HOYT Weeks in Treatment: 5 Wound Status Wound Number: 7 Primary Diabetic Wound/Ulcer of the Lower Extremity Etiology: Wound Location: Right Calcaneus Secondary Pressure Ulcer Wounding Event: Gradually Appeared Etiology: Date Acquired: 03/09/2018 Wound Open Weeks Of Treatment: 5 Status: Clustered Wound: No Comorbid Cataracts, Anemia, Arrhythmia, Congestive History: Heart Failure, Hypertension, Type II Diabetes, Rheumatoid Arthritis, Osteoarthritis, Neuropathy, Anorexia/bulimia, Confinement Anxiety Photos Wound Measurements Length: (cm) 1 Width: (cm) 1 Depth: (cm) 0.1 Area: (cm) 0.785 Volume: (cm) 0.079 % Reduction in Area: 95.2% % Reduction in Volume: 95.2% Epithelialization: Large (67-100%) Tunneling: No Undermining: No Wound Description Classification: Grade 1  Foul Odor Af Wound Margin: Distinct, outline attached Slough/Fibri Exudate Amount: None Present ter Cleansing: No no No Wound Bed Granulation Amount: Medium (34-66%) Exposed Structure Necrotic Amount: Medium (34-66%) Fascia Exposed: No Necrotic Quality: Eschar, Adherent Slough Fat Layer (Subcutaneous Tissue) Exposed: No Tendon Exposed: No Muscle Exposed: No Joint Exposed: No Bone Exposed: No Limited to Skin Breakdown Shaler, Phuong P. ([id number]) Periwound Skin Texture Texture Color No Abnormalities Noted: No No Abnormalities Noted: No Callus: No Atrophie Blanche: No Crepitus: No Cyanosis: No Excoriation: No Ecchymosis: No Induration: No Erythema: No Rash: No Hemosiderin Staining: No Scarring: No Mottled: No Pallor: No Moisture Rubor: No No Abnormalities Noted: No Dry / Scaly: Yes Temperature / Pain Maceration: No Temperature: No Abnormality Treatment Notes Wound #7 (Right Calcaneus) Notes SIlver Alginate, ABD, heel cup, 3 layer bilateral Electronic Signature(s) Signed: 04/27/2018 4:24:58 PM By: Gretta Cool, BSN, RN, CWS, Kim RN, BSN Entered By: Gretta Cool, BSN, RN, CWS, Kim on 04/27/2018 10:10:00 Pflaum,  Gayleen P. ([id number]) -------------------------------------------------------------------------------- Vitals Details Patient Name: Presas, Zamaya P. Date of Service: 04/27/2018 9:30 AM Medical Record Number: [id number] Patient Account Number: [id number] Date of Birth/Sex: 1933-08-12 (85 y.o. F) Treating RN: Cornell Barman Primary Care Caelynn Marshman: Emily Filbert Other Clinician: Referring Blakeley Margraf: Emily Filbert Treating Sana Tessmer/Extender: Melburn Hake, HOYT Weeks in Treatment: 5 Vital Signs Time Taken: 09:58 Temperature (F): 97.8 Height (in): 65 Pulse (bpm): 59 Weight (lbs): 155 Respiratory Rate (breaths/min): 16 Body Mass Index (BMI): 25.8 Blood Pressure (mmHg): 173/68 Reference Range: 80 - 120 mg / dl Electronic Signature(s) Signed: 04/27/2018 4:24:58 PM By: Gretta Cool,  BSN, RN, CWS, Kim RN, BSN Entered By: Gretta Cool, BSN, RN, CWS, Kim on 04/27/2018 09:59:40

## 2018-04-28 DIAGNOSIS — M25552 Pain in left hip: Secondary | ICD-10-CM | POA: Diagnosis not present

## 2018-04-29 ENCOUNTER — Emergency Department
Admission: EM | Admit: 2018-04-29 | Discharge: 2018-04-29 | Disposition: A | Discharge status: Skilled Nursing Facility | Payer: PPO | Attending: Emergency Medicine | Admitting: Emergency Medicine

## 2018-04-29 ENCOUNTER — Emergency Department: Payer: PPO

## 2018-04-29 ENCOUNTER — Encounter: Payer: Self-pay | Admitting: Physician Assistant

## 2018-04-29 DIAGNOSIS — Z7984 Long term (current) use of oral hypoglycemic drugs: Secondary | ICD-10-CM | POA: Diagnosis not present

## 2018-04-29 DIAGNOSIS — B373 Candidiasis of vulva and vagina: Secondary | ICD-10-CM | POA: Insufficient documentation

## 2018-04-29 DIAGNOSIS — Z79899 Other long term (current) drug therapy: Secondary | ICD-10-CM | POA: Insufficient documentation

## 2018-04-29 DIAGNOSIS — K5900 Constipation, unspecified: Secondary | ICD-10-CM | POA: Diagnosis not present

## 2018-04-29 DIAGNOSIS — E1122 Type 2 diabetes mellitus with diabetic chronic kidney disease: Secondary | ICD-10-CM | POA: Diagnosis not present

## 2018-04-29 DIAGNOSIS — R279 Unspecified lack of coordination: Secondary | ICD-10-CM | POA: Diagnosis not present

## 2018-04-29 DIAGNOSIS — K8689 Other specified diseases of pancreas: Secondary | ICD-10-CM | POA: Diagnosis not present

## 2018-04-29 DIAGNOSIS — Z743 Need for continuous supervision: Secondary | ICD-10-CM | POA: Diagnosis not present

## 2018-04-29 DIAGNOSIS — K5901 Slow transit constipation: Secondary | ICD-10-CM

## 2018-04-29 DIAGNOSIS — B3731 Acute candidiasis of vulva and vagina: Secondary | ICD-10-CM

## 2018-04-29 DIAGNOSIS — I5032 Chronic diastolic (congestive) heart failure: Secondary | ICD-10-CM | POA: Insufficient documentation

## 2018-04-29 DIAGNOSIS — E039 Hypothyroidism, unspecified: Secondary | ICD-10-CM | POA: Insufficient documentation

## 2018-04-29 DIAGNOSIS — R1030 Lower abdominal pain, unspecified: Secondary | ICD-10-CM | POA: Diagnosis not present

## 2018-04-29 DIAGNOSIS — E1140 Type 2 diabetes mellitus with diabetic neuropathy, unspecified: Secondary | ICD-10-CM | POA: Diagnosis not present

## 2018-04-29 DIAGNOSIS — R109 Unspecified abdominal pain: Secondary | ICD-10-CM | POA: Diagnosis not present

## 2018-04-29 DIAGNOSIS — Z87891 Personal history of nicotine dependence: Secondary | ICD-10-CM | POA: Insufficient documentation

## 2018-04-29 DIAGNOSIS — N3090 Cystitis, unspecified without hematuria: Secondary | ICD-10-CM | POA: Diagnosis not present

## 2018-04-29 DIAGNOSIS — I130 Hypertensive heart and chronic kidney disease with heart failure and stage 1 through stage 4 chronic kidney disease, or unspecified chronic kidney disease: Secondary | ICD-10-CM | POA: Insufficient documentation

## 2018-04-29 DIAGNOSIS — R1084 Generalized abdominal pain: Secondary | ICD-10-CM | POA: Diagnosis not present

## 2018-04-29 DIAGNOSIS — K5909 Other constipation: Secondary | ICD-10-CM | POA: Diagnosis not present

## 2018-04-29 DIAGNOSIS — N183 Chronic kidney disease, stage 3 (moderate): Secondary | ICD-10-CM | POA: Diagnosis not present

## 2018-04-29 DIAGNOSIS — R531 Weakness: Secondary | ICD-10-CM | POA: Diagnosis not present

## 2018-04-29 DIAGNOSIS — R52 Pain, unspecified: Secondary | ICD-10-CM | POA: Diagnosis not present

## 2018-04-29 LAB — CBC WITH DIFFERENTIAL/PLATELET
Abs Immature Granulocytes: 0.1 10*3/uL — ABNORMAL HIGH (ref 0.00–0.07)
Basophils Absolute: 0 10*3/uL (ref 0.0–0.1)
Basophils Relative: 0 %
Eosinophils Absolute: 0.2 10*3/uL (ref 0.0–0.5)
Eosinophils Relative: 2 %
HCT: 40.7 % (ref 36.0–46.0)
Hemoglobin: 13 g/dL (ref 12.0–15.0)
Immature Granulocytes: 1 %
Lymphocytes Relative: 11 %
Lymphs Abs: 1.5 10*3/uL (ref 0.7–4.0)
MCH: 28 pg (ref 26.0–34.0)
MCHC: 31.9 g/dL (ref 30.0–36.0)
MCV: 87.7 fL (ref 80.0–100.0)
Monocytes Absolute: 1 10*3/uL (ref 0.1–1.0)
Monocytes Relative: 7 %
Neutro Abs: 11.2 10*3/uL — ABNORMAL HIGH (ref 1.7–7.7)
Neutrophils Relative %: 79 %
Platelets: 300 10*3/uL (ref 150–400)
RBC: 4.64 MIL/uL (ref 3.87–5.11)
RDW: 13.3 % (ref 11.5–15.5)
WBC: 14.1 10*3/uL — ABNORMAL HIGH (ref 4.0–10.5)
nRBC: 0 % (ref 0.0–0.2)

## 2018-04-29 LAB — COMPREHENSIVE METABOLIC PANEL
ALT: 15 U/L (ref 0–44)
AST: 31 U/L (ref 15–41)
Albumin: 2.7 g/dL — ABNORMAL LOW (ref 3.5–5.0)
Alkaline Phosphatase: 92 U/L (ref 38–126)
Anion gap: 9 (ref 5–15)
BUN: 21 mg/dL (ref 8–23)
CO2: 27 mmol/L (ref 22–32)
Calcium: 8.2 mg/dL — ABNORMAL LOW (ref 8.9–10.3)
Chloride: 97 mmol/L — ABNORMAL LOW (ref 98–111)
Creatinine, Ser: 1.44 mg/dL — ABNORMAL HIGH (ref 0.44–1.00)
GFR calc Af Amer: 39 mL/min — ABNORMAL LOW (ref >60)
GFR calc non Af Amer: 34 mL/min — ABNORMAL LOW (ref >60)
Glucose, Bld: 182 mg/dL — ABNORMAL HIGH (ref 70–99)
Potassium: 3.6 mmol/L (ref 3.5–5.1)
Sodium: 133 mmol/L — ABNORMAL LOW (ref 135–145)
Total Bilirubin: 0.7 mg/dL (ref 0.3–1.2)
Total Protein: 6.5 g/dL (ref 6.5–8.1)

## 2018-04-29 LAB — URINALYSIS, COMPLETE (UACMP) WITH MICROSCOPIC
Bacteria, UA: NONE SEEN
Bilirubin Urine: NEGATIVE
Glucose, UA: 50 mg/dL — AB
Hgb urine dipstick: NEGATIVE
Ketones, ur: NEGATIVE mg/dL
Leukocytes,Ua: NEGATIVE
Nitrite: NEGATIVE
Protein, ur: 100 mg/dL — AB
Specific Gravity, Urine: 1.013 (ref 1.005–1.030)
pH: 6 (ref 5.0–8.0)

## 2018-04-29 LAB — LIPASE, BLOOD: Lipase: 20 U/L (ref 11–51)

## 2018-04-29 MED ORDER — FLUCONAZOLE 50 MG PO TABS
150.0000 mg | ORAL_TABLET | Freq: Once | ORAL | Status: AC
  Administered 2018-04-29: 150 mg via ORAL
  Filled 2018-04-29: qty 1

## 2018-04-29 MED ORDER — MICONAZOLE NITRATE 2 % EX CREA
TOPICAL_CREAM | Freq: Once | CUTANEOUS | Status: AC
  Administered 2018-04-29: 20:00:00 via TOPICAL
  Filled 2018-04-29 (×2): qty 14

## 2018-04-29 MED ORDER — FLUCONAZOLE 150 MG PO TABS: 150.0000 mg | ORAL_TABLET | Freq: Once | ORAL | 0 refills | Status: AC

## 2018-04-29 MED ORDER — MICONAZOLE NITRATE 2 % EX CREA: TOPICAL_CREAM | CUTANEOUS | 0 refills | Status: AC

## 2018-04-29 NOTE — ED Notes (Signed)
Pt does have a decub noted on her buttocks. Pt states she has had it, but then states no one at the nursing home knows about it. Advised her when she is discharged I will let them know so they can take care of it.

## 2018-04-29 NOTE — ED Triage Notes (Addendum)
Pt is from [facility]. States abd pain with constipation x 2 days. Pt was seen 4 days ago for same. Pt states unsure if this abd pain is the same as 4 days ago. Pt is on levaquin at the [facility] for uti, and the ct had showed constipation.

## 2018-04-29 NOTE — ED Triage Notes (Signed)
Pt able to eat and drink with no vomiting or nausea

## 2018-04-29 NOTE — ED Notes (Signed)
Sharon [name] spoke with the son and dr. Burlene Arnt spoke with the nursing home. All are aware pt will be sent back with dx chronic constipation and yeast infection.

## 2018-04-29 NOTE — ED Notes (Signed)
Spoke with son extensively regarding his mother being sent in multiple times over the last few weeks. Son is considered with his mother's pain and wants [place] to find out why. Does not believe the constipation dx of last week and is also concerned with her hyperglycemia. Attempted to explain that as an emergency room we check to make sure she has nothing acute, but otherwise her regular dr and the nursing home can take care of things like dm medication adjustment and constipation. Advised son of pt's yeast infection from antibiotics and son is aware of pt being treated for sacral decub. Pt is currently unable to ambulate d/t una boots but does sit in a chair daily at the nursing home.

## 2018-04-29 NOTE — ED Notes (Signed)
Pt cleaned of urine - the micotin cream still not sent from pharmacy. Pt is requesting to go home. Advised we need to complete our work, and speak to people before we discharge.

## 2018-04-29 NOTE — ED Notes (Signed)
While straight cathing pt, she was found to have yeast in the groin and pt states its painful with urination and itches. Pa brought into the room to examine and medications ordered. Pt is on an antibiotic for uti.

## 2018-04-29 NOTE — ED Notes (Signed)
[facility] called for transport to [facility]

## 2018-04-29 NOTE — ED Notes (Signed)
Pt not yet back from imaging.

## 2018-04-29 NOTE — ED Provider Notes (Signed)
[facility] Emergency Department Provider Note ____________________________________________  Time seen: 1549  I have reviewed the triage vital signs and the nursing notes.  HISTORY  Chief Complaint  Constipation (x2 days)  HPI Sharon [name] is a 85 y.o. female returns to the ED from [facility] assisted living facility, with a 2-day complaint of abdominal pain and 2 days of constipation.  She was evaluated here in the ED by me 4 days prior, with similar complaints.  Her CT revealed a moderate stool burden at that time.  She was discharged to continue with her previously prescribed Levaquin, and to start daily MiraLAX for stool softening.  Patient has a history of A. fib, CHF, retention, and chronic constipation.  She denies any nausea, vomiting, or dizziness.  She has been able to eat and drink without difficulty.  She reports that she can't recall whether she has a stool 2 or 3 days ago. She further states, that if she doesn't have a stool every 3 days, she knows something is wrong and she needs to come in. She is been afebrile and denies any chest pain.   Past Medical History:  Diagnosis Date  . Anemia   . Atrial fibrillation ([place])   . Cardiac arrest ([place])   . Cataract   . CHF (congestive heart failure) ([place])   . Diabetes mellitus without complication ([place])   . Edema    feet/ankles occas  . Hip fracture ([place])   . Hyperlipidemia   . Hypertension   . Hypothyroid   . Neuropathy   . Osteomyelitis ([place])    left first metatarsal  . Stroke [facility])     Patient Active Problem List   Diagnosis Date Noted  . Lymphedema 03/24/2018  . Pressure injury of skin 03/12/2018  . Atherosclerosis of native arteries of the extremities with ulceration ([place]) 05/07/2017  . Non-healing ulcer ([place]) 04/16/2017  . Diabetic neuropathy ([place]) 04/07/2017  . Septic shock ([place]) 03/20/2017  . CKD (chronic kidney disease), stage III ([place]) 03/19/2017  . Goals of care,  counseling/discussion 03/03/2017  . PAD (peripheral artery disease) ([place]) 02/03/2017  . Abdominal aortic stenosis 02/03/2017  . Bilateral lower extremity edema 02/03/2017  . Anemia 01/29/2017  . B12 deficiency 01/29/2017  . Pancreatic mass 01/29/2017  . Hyponatremia 07/07/2015  . Foot ulcer ([place]) 02/07/2015  . Acute on chronic renal failure ([place]) 11/05/2014  . Diabetic foot infection ([place]) 10/10/2014  . Chronic diastolic heart failure ([place]) 05/31/2014  . HTN (hypertension) 05/31/2014  . DM (diabetes mellitus), type 2, uncontrolled ([place]) 05/31/2014    Past Surgical History:  Procedure Laterality Date  . ACHILLES TENDON SURGERY Left 02/08/2015   Procedure: ACHILLES LENGTHENING/KIDNER;  Surgeon: Albertine Patricia, DPM;  Location: [facility] ORS;  Service: Podiatry;  Laterality: Left;  . APPENDECTOMY    . CARDIAC CATHETERIZATION  08/25/13  . CATARACT EXTRACTION W/PHACO Left 07/20/2014   Procedure: CATARACT EXTRACTION PHACO AND INTRAOCULAR LENS PLACEMENT (IOC);  Surgeon: Leandrew Koyanagi, MD;  Location: [facility] ORS;  Service: Ophthalmology;  Laterality: Left;  [place]  1:18                 AP     23.6             CDE   9.69      lot #[id number]  . CHOLECYSTECTOMY    . CORONARY ANGIOPLASTY    . HALLUX VALGUS AKIN Left 02/08/2015   Procedure: HALLUX VALGUS AKIN/ KELLER;  Surgeon: Albertine Patricia, DPM;  Location:  [facility] ORS;  Service: Podiatry;  Laterality: Left;  IVA with Local needs 1 hour for this case   . HEMIARTHROPLASTY HIP Right   . HEMIARTHROPLASTY HIP Left   . INCISION AND DRAINAGE Left 10/11/2014   Procedure: Removal of infected tibial sessmoid;  Surgeon: Albertine Patricia, DPM;  Location: [facility] ORS;  Service: Podiatry;  Laterality: Left;  . IRRIGATION AND DEBRIDEMENT FOOT Right 03/21/2017   Procedure: IRRIGATION AND DEBRIDEMENT FOOT right great toe amputation;  Surgeon: Sharlotte Alamo, DPM;  Location: [facility] ORS;  Service: Podiatry;  Laterality: Right;  . IRRIGATION AND DEBRIDEMENT FOOT Right 04/18/2017    Procedure: IRRIGATION AND DEBRIDEMENT FOOT and application wound vac;  Surgeon: Albertine Patricia, DPM;  Location: [facility] ORS;  Service: Podiatry;  Laterality: Right;  . PERIPHERAL VASCULAR BALLOON ANGIOPLASTY Right 04/17/2017   Procedure: PERIPHERAL VASCULAR BALLOON ANGIOPLASTY;  Surgeon: Katha Cabal, MD;  Location: [facility];  Service: Cardiovascular;  Laterality: Right;    Prior to Admission medications   Medication Sig Start Date End Date Taking? Authorizing Provider  acetaminophen (TYLENOL) 500 MG tablet Take 1,000 mg by mouth at bedtime. For pain management    [provider]  acetaminophen (TYLENOL) 650 MG CR tablet Take 650 mg by mouth every 8 (eight) hours as needed. For discomfort    [provider]  Amino Acids-Protein Hydrolys (FEEDING SUPPLEMENT, PRO-STAT SUGAR FREE 64,) LIQD Take 30 mLs by mouth 2 (two) times daily between meals.    [provider]  amiodarone (PACERONE) 200 MG tablet Take 1 tablet (200 mg total) by mouth daily. 03/24/17   Gladstone Lighter, MD  amitriptyline (ELAVIL) 10 MG tablet Take 10 mg by mouth daily.     [provider]  atorvastatin (LIPITOR) 10 MG tablet Take 10 mg by mouth daily at 10 pm.     [provider]  cholecalciferol (VITAMIN D) 1000 units tablet Take 1,000 Units by mouth daily.    [provider]  collagenase (SANTYL) ointment Apply topically daily. 03/12/18   Stark Jock Jude, MD  ferrous sulfate 325 (65 FE) MG tablet Take 325 mg by mouth daily.     [provider]  furosemide (LASIX) 20 MG tablet  03/15/18   [provider]  gabapentin (NEURONTIN) 100 MG capsule Take 2 capsules (200 mg total) by mouth 3 (three) times daily. 03/28/18 03/28/19  Rusty Aus, MD  glimepiride (AMARYL) 4 MG tablet Take 4 mg by mouth daily.     [provider]  GLUCERNA (GLUCERNA) LIQD Take 237 mLs by mouth 2 (two) times daily between meals.    [provider]  JANUVIA 50 MG  tablet Take 50 mg by mouth daily. Take along with Amaryl 02/15/18   [provider]  levothyroxine (SYNTHROID, LEVOTHROID) 112 MCG tablet Take 112 mcg by mouth daily before breakfast. 30 minutes before breakfast    [provider]  metoprolol tartrate (LOPRESSOR) 25 MG tablet Take 1 tablet (25 mg total) by mouth 2 (two) times daily. 04/21/17   Saundra Shelling, MD  mupirocin ointment (BACTROBAN) 2 % Place 1 application into the nose 2 (two) times daily.    [provider]  traMADol (ULTRAM) 50 MG tablet Take 1 tablet (50 mg total) by mouth every 6 (six) hours as needed for moderate pain. 03/27/18   Paulette Blanch, MD  traZODone (DESYREL) 50 MG tablet Take 50 mg by mouth at bedtime. For anxiety 06/24/17 06/24/18  [provider]  vitamin C (ASCORBIC ACID)  500 MG tablet Take 500 mg by mouth daily.     [provider]    Allergies Carbamazepine; Cymbalta [duloxetine hcl]; Duloxetine; Lyrica [pregabalin]; and Temazepam  Family History  Problem Relation Age of Onset  . Heart attack Mother     Social History Social History   Tobacco Use  . Smoking status: Former Smoker    Packs/day: 0.50    Years: 7.00    Pack years: 3.50    Types: Cigarettes    Last attempt to quit: 05/30/1989    Years since quitting: 28.9  . Smokeless tobacco: Never Used  Substance Use Topics  . Alcohol use: No    Alcohol/week: 0.0 standard drinks  . Drug use: No    Review of Systems  Constitutional: Negative for fever. Cardiovascular: Negative for chest pain. Respiratory: Negative for shortness of breath. Gastrointestinal: Negative for vomiting and diarrhea. Reports abdominal pain and constipation Genitourinary: Negative for dysuria. Musculoskeletal: Negative for back pain. Skin: Negative for rash. Neurological: Negative for headaches, focal weakness or numbness. ____________________________________________  PHYSICAL EXAM:  VITAL SIGNS: ED Triage Vitals  Enc Vitals  Group     BP 04/29/18 1535 (!) 179/63     Pulse Rate 04/29/18 1535 (!) 59     Resp 04/29/18 1535 16     Temp 04/29/18 1535 97.8 F (36.6 C)     Temp Source 04/29/18 1535 Oral     SpO2 04/29/18 1535 97 %     Weight 04/29/18 1536 154 lb 5.2 oz (70 kg)     Height 04/29/18 1536 5\' 5"  (1.651 m)     Head Circumference --      Peak Flow --      Pain Score 04/29/18 1535 6     Pain Loc --      Pain Edu? --      Excl. in [place]? --     Constitutional: Alert and oriented. Well appearing and in no distress. Head: Normocephalic and atraumatic. Eyes: Conjunctivae are normal. Normal extraocular movements Cardiovascular: Normal rate, regular rhythm. Normal distal pulses. Respiratory: Normal respiratory effort. No wheezes/rales/rhonchi. Gastrointestinal: Soft, mildly distended, and nontender. Normal bowel sounds x 4. No rebound, guarding, or organomegaly. DRE reveals empty rectal vault. No impaction or stool noted.  GU: normal external genitalia. Vulvar and perineum skin is erythematous, edematous, and with well-demarcated borders.  Musculoskeletal: Nontender with normal range of motion in all extremities.  Neurologic: Normal speech and language. No gross focal neurologic deficits are appreciated. Skin:  Skin is warm, dry and intact. No rash noted. Patient with a chronic 4x6 cm decubitus ulcer on the sacrum.  Psychiatric: Mood and affect are normal. Patient exhibits appropriate insight and judgment. ____________________________________________   LABS (pertinent positives/negatives) Labs Reviewed  CBC WITH DIFFERENTIAL/PLATELET - Abnormal; Notable for the following components:      Result Value   WBC 14.1 (*)    Neutro Abs 11.2 (*)    Abs Immature Granulocytes 0.10 (*)    All other components within normal limits  URINALYSIS, COMPLETE (UACMP) WITH MICROSCOPIC - Abnormal; Notable for the following components:   Color, Urine YELLOW (*)    APPearance CLEAR (*)    Glucose, UA 50 (*)    Protein, ur  100 (*)    All other components within normal limits  COMPREHENSIVE METABOLIC PANEL - Abnormal; Notable for the following components:   Sodium 133 (*)    Chloride 97 (*)    Glucose, Bld 182 (*)    Creatinine, Ser  1.44 (*)    Calcium 8.2 (*)    Albumin 2.7 (*)    GFR calc non Af Amer 34 (*)    GFR calc Af Amer 39 (*)    All other components within normal limits  URINE CULTURE  LIPASE, BLOOD  ____________________________________________   RADIOLOGY  DG ABD w/ CXR   IMPRESSION: Mild left lower lobe atelectasis.  Normal bowel gas pattern ____________________________________________  PROCEDURES  Procedures Diflucan 150 mg PO Miconazole 2% topically to perineum   ____________________________________________  INITIAL IMPRESSION / ASSESSMENT AND PLAN / ED COURSE  Differential diagnosis includes, but is not limited to, ovarian cyst, ovarian torsion, acute appendicitis, diverticulitis, pancreatitis, urinary tract infection/pyelonephritis, endometriosis, bowel obstruction, colitis, renal colic, gastroenteritis, hernia, fibroids, endometriosis, etc.  Patient presents to the ED from her assisted living facility, with complaints of constipation. Her upright ABD shows normal gas bowel pattern. She has apparently had a bowel movement in the last day or two (according to the patient's son). She is a poor historian, but her MAR shows daily doses of PEG and dulcolax for the last few days. She has a yeast infection of the vulva and a stable sacral decubitus. She has a stable pancreatic mass, unchanged from previous CT in 2019. This can be further evaluated on an outpatient basis, given the current high-risk state of a non-emergent consultation during this current pandemic.   ----------------------------------------- 7:41 PM on 04/29/2018 ----------------------------------------- S/w [name], the patient's son via phone. He reports frustration with his mother's subjective complaints and the  nursing facility staff reports of c/o abdominal pain. He feels has no other choice but to send her to the ED at the facility's urging. Once here, she has no complaints and is unclear on her reason for reporting, last BM, or current meds. He is reassured by her normal imaging and labs. He is aware she is comfortable, she is not impacted with stool, she does not have an acute abdomen or acute pancreatitis. She will be discharged with instructions to follow-up with the primary provider for an outpatient GI evaluation.  ____________________________________________  FINAL CLINICAL IMPRESSION(S) / ED DIAGNOSES  Final diagnoses:  Lower abdominal pain  Constipation by delayed colonic transit  Vulvovaginitis due to yeast      Tessy Pawelski, Dannielle Karvonen, PA-C 04/29/18 2039    Schuyler Amor, MD 04/29/18 2200

## 2018-04-29 NOTE — Discharge Instructions (Addendum)
Sharon [name] was evaluated in the ED for abdominal pain. Her labs and are at baseline and her imaging is normal. She has a yeast infection of the vulva/perineum, which will need to be treated with antifungal cream and a repeat oral dose of Diflucan in 1 week. Follow-up with Dr. Sabra Heck for ongoing management.

## 2018-04-29 NOTE — ED Provider Notes (Addendum)
---------------------------------------- 7:34 PM on 04/29/2018 -----------------------------------------  Patient again here in the emergency department for which she and the accompanying EMS providers described as constipation.  However she did have a bowel movement last night she has no rectal stool at this time, CT scan was done a few days ago which showed some stool burden but no obstipation or significant colonic dilatation, she has chronic pancreatic mass which does not appear acutely different.  We called Nanine Means they stated that he called center and for what they described his abdominal pain although she is eating drinking and afebrile with no vomiting and diarrhea and we talked extensively to the son about the goals for care.  Obviously this is a global pandemic at this time and patient is a very high risk patient to be coming out of the emergency room.  On exam she is awake and alert at her baseline, her abdomen is completely benign cardiopulmonary exam is reassuring and deep palpation in all fields betrays no evidence of abdominal discomfort.  Rectal exam, please see PA note for.  Blood work shows a very slight elevated white count lipase is normal, and the rest of her work-up including cath urine is negative.  No evidence of urinary tract infection we have reviewed her prior cultures which are unremarkable as well we are repeating the cultures and she is take antibiotics for what is reported to be urinary tract infections but at this time appears to be clear.  She is not coughing, her vital signs are reassuring, she does get anxious here and it sometimes increases her blood pressure certainly no evidence of endorgan damage and we have done our best to evaluate her from every point of view possible.  I do not think that she requires a repeat CT scan today given that there is no reproducible abdominal discomfort.  She does have a chronic mass that was seen in 2019 in her pancreas which certainly  could benefit from outpatient care but I do not think requires admission during a pandemic.  We did speak extensively to her son, I do not see any indication for an acute admission.  The mass in her pancreas is been there since 1 year ago.  ----------------------------------------- 20:20 PM on 04/29/2018 -----------------------------------------  We talked to the patient's son for extensive multiple times he has not actually seen the patient.  Patient has not complained of any abdominal pain or anything else to me, serial abdominal exams are completely benign prior to discharge. We do not see any acute pathology I do not think CT scanning this patient had a second time in 3 days is in her best interest.  Patient suffers from dementia.  I called the nursing home personally, they could not give any more information than that she, demented patient, said that she had abdominal pain.  And that is why they sent her here.  Patient herself does not endorse abdominal pain to my history.  So there is a patient here who denies abdominal pain has reassuring work-up serial abdominal exams are negative and there is very limited history both from her in the nursing home.  She has been afebrile here.  White count is mildly elevated which is a non-specific finding.  Her cath urine is normal, we talked to her son who stated that she had a large bowel movement yesterday and that seemed to take care of the constipation issue.  There is no significant findings on for a of anything significant.  Did not think  a repeat CT scan was warranted given her exam.    We talked extensively about the risks to the patient who is in a high risk category for COVID infection in the ER and he is strong feeling at the time seem to be that he wanted her to go home.  We did understand and express her understanding to him of the difficulty that he faces of being not able to personally visit his mother at the facility due to their restrictions.  This  phone call was held by the PA, please see her note I was personally sitting next to her and did hear the entire conversation it lasted for about 20 minutes and all of his questions were answered.  Her lungs are clear there is no evidence of pneumonia.  That point, given that there was really no chief complaint, and no acute findings on any of our exam or work-up with extensive recent work-up no ongoing complaints of any variety from the patient that she was safe for discharge.  Of course, both the son and the nursing home were instructed that if they have other concerns are happy to reassess.  Considering the patient's symptoms, medical history, and physical examination today, I have low suspicion for cholecystitis or biliary pathology, pancreatitis, perforation or bowel obstruction, hernia, intra-abdominal abscess, AAA or dissection, volvulus or intussusception, mesenteric ischemia, ischemic gut, pyelonephritis or appendicitis.    Schuyler Amor, MD 04/29/18 [place]    Schuyler Amor, MD 04/29/18 2226

## 2018-04-29 NOTE — ED Notes (Signed)
Pt picked up and transported back to nh

## 2018-04-30 DIAGNOSIS — N3090 Cystitis, unspecified without hematuria: Secondary | ICD-10-CM | POA: Diagnosis not present

## 2018-04-30 DIAGNOSIS — K8689 Other specified diseases of pancreas: Secondary | ICD-10-CM | POA: Diagnosis not present

## 2018-04-30 DIAGNOSIS — E559 Vitamin D deficiency, unspecified: Secondary | ICD-10-CM | POA: Diagnosis not present

## 2018-04-30 DIAGNOSIS — K5909 Other constipation: Secondary | ICD-10-CM | POA: Diagnosis not present

## 2018-04-30 DIAGNOSIS — Z79899 Other long term (current) drug therapy: Secondary | ICD-10-CM | POA: Diagnosis not present

## 2018-04-30 DIAGNOSIS — R109 Unspecified abdominal pain: Secondary | ICD-10-CM | POA: Diagnosis not present

## 2018-04-30 LAB — URINE CULTURE: Culture: <10000 — AB

## 2018-05-04 ENCOUNTER — Encounter (INDEPENDENT_AMBULATORY_CARE_PROVIDER_SITE_OTHER): Payer: Self-pay | Admitting: Nurse Practitioner

## 2018-05-04 ENCOUNTER — Other Ambulatory Visit: Payer: Self-pay

## 2018-05-04 ENCOUNTER — Ambulatory Visit (INDEPENDENT_AMBULATORY_CARE_PROVIDER_SITE_OTHER): Payer: PPO | Admitting: Nurse Practitioner

## 2018-05-04 VITALS — BP 176/93 | HR 60 | Resp 15

## 2018-05-04 DIAGNOSIS — L89622 Pressure ulcer of left heel, stage 2: Secondary | ICD-10-CM

## 2018-05-04 DIAGNOSIS — E1129 Type 2 diabetes mellitus with other diabetic kidney complication: Secondary | ICD-10-CM | POA: Diagnosis not present

## 2018-05-04 DIAGNOSIS — R1012 Left upper quadrant pain: Secondary | ICD-10-CM | POA: Diagnosis not present

## 2018-05-04 DIAGNOSIS — E1122 Type 2 diabetes mellitus with diabetic chronic kidney disease: Secondary | ICD-10-CM | POA: Diagnosis not present

## 2018-05-04 DIAGNOSIS — I739 Peripheral vascular disease, unspecified: Secondary | ICD-10-CM

## 2018-05-04 DIAGNOSIS — I890 Lymphedema, not elsewhere classified: Secondary | ICD-10-CM

## 2018-05-04 DIAGNOSIS — Z79899 Other long term (current) drug therapy: Secondary | ICD-10-CM | POA: Diagnosis not present

## 2018-05-04 DIAGNOSIS — L89152 Pressure ulcer of sacral region, stage 2: Secondary | ICD-10-CM | POA: Diagnosis not present

## 2018-05-04 DIAGNOSIS — Z87891 Personal history of nicotine dependence: Secondary | ICD-10-CM | POA: Diagnosis not present

## 2018-05-04 DIAGNOSIS — I10 Essential (primary) hypertension: Secondary | ICD-10-CM | POA: Diagnosis not present

## 2018-05-04 DIAGNOSIS — E876 Hypokalemia: Secondary | ICD-10-CM | POA: Diagnosis not present

## 2018-05-04 NOTE — Progress Notes (Signed)
SUBJECTIVE:  Patient ID: Sharon [name], female    DOB: 11/26/1933, 85 y.o.   MRN: [id number] Chief Complaint  Patient presents with  . Follow-up    unna check    HPI  Sharon [name] is a 85 y.o. female returns today for check of Unna wraps.  Today the swelling is virtually nonexistent.  There are no more venous ulcerations.  Overall her legs look very well.  There is still a small wound on her right heel, as well as a pressure ulcer on her left.  The patient has an upcoming appointment with wound center on next week.  Otherwise the patient has been doing well.  The patient denies any fevers, chills, nausea, vomiting or diarrhea.  She denies any chest pain or shortness of breath.  She is also received her lymphedema pump however she has not yet been able to utilize it due to the facility needing orders.  Past Medical History:  Diagnosis Date  . Anemia   . Atrial fibrillation ([place])   . Cardiac arrest ([place])   . Cataract   . CHF (congestive heart failure) ([place])   . Diabetes mellitus without complication ([place])   . Edema    feet/ankles occas  . Hip fracture ([place])   . Hyperlipidemia   . Hypertension   . Hypothyroid   . Neuropathy   . Osteomyelitis ([place])    left first metatarsal  . Stroke [facility])     Past Surgical History:  Procedure Laterality Date  . ACHILLES TENDON SURGERY Left 02/08/2015   Procedure: ACHILLES LENGTHENING/KIDNER;  Surgeon: Albertine Patricia, DPM;  Location: [facility] ORS;  Service: Podiatry;  Laterality: Left;  . APPENDECTOMY    . CARDIAC CATHETERIZATION  08/25/13  . CATARACT EXTRACTION W/PHACO Left 07/20/2014   Procedure: CATARACT EXTRACTION PHACO AND INTRAOCULAR LENS PLACEMENT (IOC);  Surgeon: Leandrew Koyanagi, MD;  Location: [facility] ORS;  Service: Ophthalmology;  Laterality: Left;  [place]  1:18                 AP     23.6             CDE   9.69      lot #[phone]  . CHOLECYSTECTOMY    . CORONARY ANGIOPLASTY    . HALLUX VALGUS AKIN Left 02/08/2015   Procedure: HALLUX  VALGUS AKIN/ KELLER;  Surgeon: Albertine Patricia, DPM;  Location: [facility] OR[facility];  Service: Podiatry;  Laterality: Left;  IVA with Local needs 1 hour for this case   . HEMIARTHROPLASTY HIP Right   . HEMIARTHROPLASTY HIP Left   . INCISION AND DRAINAGE Left 10/11/2014   Procedure: Removal of infected tibial sessmoid;  Surgeon: Albertine Patricia, DPM;  Location: [facility];  Service: Podiatry;  Laterality: Left;  . IRRIGATION AND DEBRIDEMENT FOOT Right 03/21/2017   Procedure: IRRIGATION AND DEBRIDEMENT FOOT right great toe amputation;  Surgeon: Sharlotte Alamo, DPM;  Location: [facility];  Service: Podiatry;  Laterality: Right;  . IRRIGATION AND DEBRIDEMENT FOOT Right 04/18/2017   Procedure: IRRIGATION AND DEBRIDEMENT FOOT and application wound vac;  Surgeon: Albertine Patricia, DPM;  Location: [facility];  Service: Podiatry;  Laterality: Right;  . PERIPHERAL VASCULAR BALLOON ANGIOPLASTY Right 04/17/2017   Procedure: PERIPHERAL VASCULAR BALLOON ANGIOPLASTY;  Surgeon: Katha Cabal, MD;  Location: [facility];  Service: Cardiovascular;  Laterality: Right;    Social History   Socioeconomic History  . Marital status: Widowed    Spouse name: Not on file  . Number of  children: Not on file  . Years of education: Not on file  . Highest education level: Not on file  Occupational History  . Occupation: retired.  Social Needs  . Financial resource strain: Not hard at all  . Food insecurity:    Worry: Not on file    Inability: Not on file  . Transportation needs:    Medical: Not on file    Non-medical: Not on file  Tobacco Use  . Smoking status: Former Smoker    Packs/day: 0.50    Years: 7.00    Pack years: 3.50    Types: Cigarettes    Last attempt to quit: 05/30/1989    Years since quitting: 28.9  . Smokeless tobacco: Never Used  Substance and Sexual Activity  . Alcohol use: No    Alcohol/week: 0.0 standard drinks  . Drug use: No  . Sexual activity: Not on file  Lifestyle  . Physical activity:     Days per week: Not on file    Minutes per session: Not on file  . Stress: Not on file  Relationships  . Social connections:    Talks on phone: Not on file    Gets together: Not on file    Attends religious service: Not on file    Active member of club or organization: Not on file    Attends meetings of clubs or organizations: Not on file    Relationship status: Not on file  . Intimate partner violence:    Fear of current or ex partner: Not on file    Emotionally abused: Not on file    Physically abused: Not on file    Forced sexual activity: Not on file  Other Topics Concern  . Not on file  Social History Narrative   Lives at home by herself.   Ambulates with a walker    Family History  Problem Relation Age of Onset  . Heart attack Mother     Allergies  Allergen Reactions  . Carbamazepine Other (See Comments)    Reaction: unknown  . Cymbalta [Duloxetine Hcl] Other (See Comments)    Confusion, disorientation  . Duloxetine     Other reaction(s): Other (See Comments) Confusion, disorientation  . Lyrica [Pregabalin] Other (See Comments)    Patient and son states this makes the patient confused.  . Temazepam     Other reaction(s): Hallucination     Review of Systems   Review of Systems: Negative Unless Checked Constitutional: [] Weight loss  [] Fever  [] Chills Cardiac: [] Chest pain   []  Atrial Fibrillation  [] Palpitations   [] Shortness of breath when laying flat   [] Shortness of breath with exertion. [] Shortness of breath at rest Vascular:  [] Pain in legs with walking   [] Pain in legs with standing [] Pain in legs when laying flat   [] Claudication    [] Pain in feet when laying flat    [] History of DVT   [] Phlebitis   [x] Swelling in legs   [] Varicose veins   [] Non-healing ulcers Pulmonary:   [] Uses home oxygen   [] Productive cough   [] Hemoptysis   [] Wheeze  [] COPD   [] Asthma Neurologic:  [] Dizziness   [] Seizures  [] Blackouts [] History of stroke   [] History of TIA  [] Aphasia    [] Temporary Blindness   [] Weakness or numbness in arm   [] Weakness or numbness in leg Musculoskeletal:   [] Joint swelling   [] Joint pain   [] Low back pain  []  History of Knee Replacement [] Arthritis [] back Surgeries  []  Spinal Stenosis  Hematologic:  [] Easy bruising  [] Easy bleeding   [] Hypercoagulable state   [] Anemic Gastrointestinal:  [] Diarrhea   [] Vomiting  [] Gastroesophageal reflux/heartburn   [] Difficulty swallowing. [] Abdominal pain Genitourinary:  [x] Chronic kidney disease   [] Difficult urination  [] Anuric   [] Blood in urine [] Frequent urination  [] Burning with urination   [] Hematuria Skin:  [] Rashes   [x] Ulcers [] Wounds Psychological:  [] History of anxiety   []  History of major depression  []  Memory Difficulties      OBJECTIVE:   Physical Exam  BP (!) 176/93 (BP Location: Right Arm)   Pulse 60   Resp 15   Gen: WD/WN, NAD Head: [place]/AT, No temporalis wasting.  Ear/Nose/Throat: Hearing grossly intact, nares w/o erythema or drainage Eyes: PER, EOMI, sclera nonicteric.  Neck: Supple, no masses.  No JVD.  Pulmonary:  Good air movement, no use of accessory muscles.  Cardiac: RRR Vascular:  Vessel Right Left  Radial Palpable Palpable  Dorsalis Pedis Not Palpable Not Palpable  Posterior Tibial Not Palpable Not Palpable   Gastrointestinal: soft, non-distended. No guarding/no peritoneal signs.  Musculoskeletal: M/S 5/5 throughout.  No deformity or atrophy.  Neurologic: Pain and light touch intact in extremities.  Symmetrical.  Speech is fluent. Motor exam as listed above. Psychiatric: Judgment intact, Mood & affect appropriate for pt's clinical situation. Dermatologic:  Left heel ulcer. No changes consistent with cellulitis. Lymph : No Cervical lymphadenopathy, no lichenification or skin changes of chronic lymphedema.       ASSESSMENT AND PLAN:  1. PAD (peripheral artery disease) (HCC) Previous studies indicate that the patient has strong monophasic waveforms as well as  decent ABIs.  Based upon this she should have the ability to heal her wounds.  If her wounds are not healing or worsening, the son was instructed to give [place] a call so that we can repeat the studies to determine if invasive options may be necessary.  Otherwise we will repeat ABIs in 6 months. - VAS [place] ABI WITH/WO TBI; Future  2. Essential hypertension Continue antihypertensive medications as already ordered, these medications have been reviewed and there are no changes at this time.   3. Pressure injury of left heel, stage 2 (HCC) We will do wet-to-dry dressings on her left heel.  The patient has an upcoming appointment with the wound care center and we will allow the wound care center to take over care.  In the meantime an order has been sent to her facility to do daily wet-to-dry dressings until wound care can decide whether they want a different type of dressing.  4. Lymphedema The patient has minimal swelling today and all of her venous ulcerations have healed.  Based on this fact we will removed her from wraps today.  Patient has received her lymphedema pump however according to her son she has not yet used it.  He states that they need an order to do so.  An order was sent to begin use for 1 hour on a nightly basis with afternoons on a as needed basis for increased swelling.    Otherwise, the patient should continue to wear her medical grade 1 compression stockings on a daily basis.  She should elevate as much as possible, with exercise.  The patient and son were instructed that should she develop any wounds or ulcerations or increased swelling they should come in and see [place], otherwise we will reevaluate her swelling in 6 months.   Current Outpatient Medications on File Prior to Visit  Medication Sig Dispense Refill  .  acetaminophen (TYLENOL) 500 MG tablet Take 1,000 mg by mouth at bedtime. For pain management    . acetaminophen (TYLENOL) 650 MG CR tablet Take 650 mg by mouth every 8  (eight) hours as needed. For discomfort    . Amino Acids-Protein Hydrolys (FEEDING SUPPLEMENT, PRO-STAT SUGAR FREE 64,) LIQD Take 30 mLs by mouth 2 (two) times daily between meals.    Marland Kitchen amiodarone (PACERONE) 200 MG tablet Take 1 tablet (200 mg total) by mouth daily. 30 tablet 2  . amitriptyline (ELAVIL) 10 MG tablet Take 10 mg by mouth daily.     Marland Kitchen atorvastatin (LIPITOR) 10 MG tablet Take 10 mg by mouth daily at 10 pm.     . cholecalciferol (VITAMIN D) 1000 units tablet Take 1,000 Units by mouth daily.    . collagenase (SANTYL) ointment Apply topically daily. 15 g 0  . ferrous sulfate 325 (65 FE) MG tablet Take 325 mg by mouth daily.     Derrill Memo ON 05/06/2018] fluconazole (DIFLUCAN) 150 MG tablet Take 1 tablet (150 mg total) by mouth once for 1 dose. 1 tablet 0  . furosemide (LASIX) 20 MG tablet     . gabapentin (NEURONTIN) 100 MG capsule Take 2 capsules (200 mg total) by mouth 3 (three) times daily. 180 capsule 11  . glimepiride (AMARYL) 4 MG tablet Take 4 mg by mouth daily.     Marland Kitchen GLUCERNA (GLUCERNA) LIQD Take 237 mLs by mouth 2 (two) times daily between meals.    Marland Kitchen JANUVIA 50 MG tablet Take 50 mg by mouth daily. Take along with Amaryl    . levothyroxine (SYNTHROID, LEVOTHROID) 112 MCG tablet Take 112 mcg by mouth daily before breakfast. 30 minutes before breakfast    . metoprolol tartrate (LOPRESSOR) 25 MG tablet Take 1 tablet (25 mg total) by mouth 2 (two) times daily. 60 tablet 0  . miconazole (MICATIN) 2 % cream Apply to affected area 2 times daily 150 g 0  . mupirocin ointment (BACTROBAN) 2 % Place 1 application into the nose 2 (two) times daily.    . traMADol (ULTRAM) 50 MG tablet Take 1 tablet (50 mg total) by mouth every 6 (six) hours as needed for moderate pain. 20 tablet 0  . traZODone (DESYREL) 50 MG tablet Take 50 mg by mouth at bedtime. For anxiety    . vitamin C (ASCORBIC ACID) 500 MG tablet Take 500 mg by mouth daily.      Current Facility-Administered Medications on File Prior  to Visit  Medication Dose Route Frequency Provider Last Rate Last Dose  . sodium chloride flush (NS) 0.9 % injection 10 mL  10 mL Intracatheter PRN Karen Kitchens, NP        There are no Patient Instructions on file for this visit. No follow-ups on file.   Kris Hartmann, NP  This note was completed with Sales executive.  Any errors are purely unintentional.

## 2018-05-06 DIAGNOSIS — N189 Chronic kidney disease, unspecified: Secondary | ICD-10-CM | POA: Diagnosis not present

## 2018-05-06 DIAGNOSIS — E1122 Type 2 diabetes mellitus with diabetic chronic kidney disease: Secondary | ICD-10-CM | POA: Diagnosis not present

## 2018-05-06 DIAGNOSIS — K5909 Other constipation: Secondary | ICD-10-CM | POA: Diagnosis not present

## 2018-05-06 DIAGNOSIS — R109 Unspecified abdominal pain: Secondary | ICD-10-CM | POA: Diagnosis not present

## 2018-05-07 DIAGNOSIS — M545 Low back pain: Secondary | ICD-10-CM | POA: Diagnosis not present

## 2018-05-07 DIAGNOSIS — E876 Hypokalemia: Secondary | ICD-10-CM | POA: Diagnosis not present

## 2018-05-08 DIAGNOSIS — E1122 Type 2 diabetes mellitus with diabetic chronic kidney disease: Secondary | ICD-10-CM | POA: Diagnosis not present

## 2018-05-08 DIAGNOSIS — N183 Chronic kidney disease, stage 3 (moderate): Secondary | ICD-10-CM | POA: Diagnosis not present

## 2018-05-08 DIAGNOSIS — K5909 Other constipation: Secondary | ICD-10-CM | POA: Diagnosis not present

## 2018-05-08 DIAGNOSIS — R109 Unspecified abdominal pain: Secondary | ICD-10-CM | POA: Diagnosis not present

## 2018-05-10 DIAGNOSIS — L89610 Pressure ulcer of right heel, unstageable: Secondary | ICD-10-CM | POA: Diagnosis not present

## 2018-05-10 DIAGNOSIS — L89153 Pressure ulcer of sacral region, stage 3: Secondary | ICD-10-CM | POA: Diagnosis not present

## 2018-05-10 DIAGNOSIS — L89322 Pressure ulcer of left buttock, stage 2: Secondary | ICD-10-CM | POA: Diagnosis not present

## 2018-05-11 ENCOUNTER — Encounter: Payer: PPO | Admitting: Physician Assistant

## 2018-05-11 ENCOUNTER — Other Ambulatory Visit: Payer: Self-pay

## 2018-05-11 DIAGNOSIS — L89153 Pressure ulcer of sacral region, stage 3: Secondary | ICD-10-CM | POA: Diagnosis not present

## 2018-05-11 DIAGNOSIS — N189 Chronic kidney disease, unspecified: Secondary | ICD-10-CM | POA: Diagnosis not present

## 2018-05-11 DIAGNOSIS — R609 Edema, unspecified: Secondary | ICD-10-CM | POA: Diagnosis not present

## 2018-05-11 DIAGNOSIS — D490 Neoplasm of unspecified behavior of digestive system: Secondary | ICD-10-CM | POA: Diagnosis not present

## 2018-05-11 DIAGNOSIS — L89629 Pressure ulcer of left heel, unspecified stage: Secondary | ICD-10-CM | POA: Diagnosis not present

## 2018-05-11 DIAGNOSIS — R1031 Right lower quadrant pain: Secondary | ICD-10-CM | POA: Diagnosis not present

## 2018-05-11 DIAGNOSIS — L89619 Pressure ulcer of right heel, unspecified stage: Secondary | ICD-10-CM | POA: Diagnosis not present

## 2018-05-11 DIAGNOSIS — R1032 Left lower quadrant pain: Secondary | ICD-10-CM | POA: Diagnosis not present

## 2018-05-11 DIAGNOSIS — R740 Nonspecific elevation of levels of transaminase and lactic acid dehydrogenase [LDH]: Secondary | ICD-10-CM | POA: Diagnosis not present

## 2018-05-11 DIAGNOSIS — E11621 Type 2 diabetes mellitus with foot ulcer: Secondary | ICD-10-CM | POA: Diagnosis not present

## 2018-05-11 DIAGNOSIS — K5909 Other constipation: Secondary | ICD-10-CM | POA: Diagnosis not present

## 2018-05-11 DIAGNOSIS — L97522 Non-pressure chronic ulcer of other part of left foot with fat layer exposed: Secondary | ICD-10-CM | POA: Diagnosis not present

## 2018-05-11 DIAGNOSIS — S32010S Wedge compression fracture of first lumbar vertebra, sequela: Secondary | ICD-10-CM | POA: Diagnosis not present

## 2018-05-11 DIAGNOSIS — R635 Abnormal weight gain: Secondary | ICD-10-CM | POA: Diagnosis not present

## 2018-05-12 DIAGNOSIS — F320 Major depressive disorder, single episode, mild: Secondary | ICD-10-CM | POA: Diagnosis not present

## 2018-05-12 NOTE — Progress Notes (Signed)
KAMALA, KOLTON ([id number]) Visit Report for 05/11/2018 Arrival Information Details Patient Name: Sharon [name], Sharon [name]. Date of Service: 05/11/2018 11:00 AM Medical Record Number: [id number] Patient Account Number: [id number] Date of Birth/Sex: 1933-09-09 (85 y.o. F) Treating RN: Harold Barban Primary Care Jalayiah Bibian: Emily Filbert Other Clinician: Referring Malana Eberwein: Emily Filbert Treating Aino Heckert/Extender: Melburn Hake, HOYT Weeks in Treatment: 7 Visit Information History Since Last Visit Added or deleted any medications: No Patient Arrived: Wheel Chair Any new allergies or adverse reactions: No Arrival Time: 10:57 Had a fall or experienced change in No Accompanied By: self activities of daily living that may affect Transfer Assistance: Manual risk of falls: Patient Identification Verified: Yes Signs or symptoms of abuse/neglect since last visito No Secondary Verification Process Yes Hospitalized since last visit: No Completed: Implantable device outside of the clinic excluding No Patient Has Alerts: Yes cellular tissue based products placed in the center Patient Alerts: ABI 06/29/17 L .94 since last visit: R .89 Has Dressing in Place as Prescribed: Yes Has Compression in Place as Prescribed: No Pain Present Now: Yes Electronic Signature(s) Signed: 05/12/2018 4:45:14 PM By: Harold Barban Entered By: Harold Barban on 05/11/2018 11:05:31 Dias, Jackalynn P. ([id number]) -------------------------------------------------------------------------------- Compression Therapy Details Patient Name: Carden, Karla P. Date of Service: 05/11/2018 11:00 AM Medical Record Number: [id number] Patient Account Number: [id number] Date of Birth/Sex: 06-Dec-1933 (85 y.o. F) Treating RN: Montey Hora Primary Care Nuri Larmer: Emily Filbert Other Clinician: Referring Crystal Scarberry: Emily Filbert Treating [place] Welcome/Extender: Melburn Hake, HOYT Weeks in Treatment: 7 Compression Therapy Performed for Wound  Assessment: Wound #6 Left Calcaneus Performed By: Clinician Montey Hora, RN Compression Type: Three Layer Pre Treatment ABI: 0.9 Post Procedure Diagnosis Same as Pre-procedure Electronic Signature(s) Signed: 05/11/2018 3:19:36 PM By: Montey Hora Entered By: Montey Hora on 05/11/2018 11:53:00 Kaczorowski, Shuntell P. ([id number]) -------------------------------------------------------------------------------- Compression Therapy Details Patient Name: Durrett, Raffaela P. Date of Service: 05/11/2018 11:00 AM Medical Record Number: [id number] Patient Account Number: [id number] Date of Birth/Sex: 12/18/1933 (85 y.o. F) Treating RN: Montey Hora Primary Care Welden Hausmann: Emily Filbert Other Clinician: Referring Enzo Treu: Emily Filbert Treating Maison Kestenbaum/Extender: Melburn Hake, HOYT Weeks in Treatment: 7 Compression Therapy Performed for Wound Assessment: Wound #7 Right Calcaneus Performed By: Clinician Montey Hora, RN Compression Type: Three Layer Pre Treatment ABI: 0.9 Post Procedure Diagnosis Same as Pre-procedure Electronic Signature(s) Signed: 05/11/2018 3:19:36 PM By: Montey Hora Entered By: Montey Hora on 05/11/2018 11:53:00 Anacker, Vergie P. ([id number]) -------------------------------------------------------------------------------- Lower Extremity Assessment Details Patient Name: Stirewalt, Vance P. Date of Service: 05/11/2018 11:00 AM Medical Record Number: [id number] Patient Account Number: [id number] Date of Birth/Sex: 11/13/1933 (85 y.o. F) Treating RN: Harold Barban Primary Care Mickala Laton: Emily Filbert Other Clinician: Referring Sherrye Puga: Emily Filbert Treating Altamese Deguire/Extender: Melburn Hake, HOYT Weeks in Treatment: 7 Vascular Assessment Pulses: Dorsalis Pedis Palpable: [Left:No] [Right:No] Posterior Tibial Palpable: [Left:No] [Right:No] Electronic Signature(s) Signed: 05/12/2018 4:45:14 PM By: Harold Barban Entered By: Harold Barban on 05/11/2018 11:22:12 Bankhead,  Peace P. ([id number]) -------------------------------------------------------------------------------- Multi Wound Chart Details Patient Name: Wessels, Skyann P. Date of Service: 05/11/2018 11:00 AM Medical Record Number: [id number] Patient Account Number: [id number] Date of Birth/Sex: 1933-02-04 (85 y.o. 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 Lower Extremity Secondary Etiology: N/A N/A N/A Comorbid History: Cataracts, Anemia, Cataracts, Anemia, Cataracts, Anemia, Arrhythmia, Congestive Heart Arrhythmia, Congestive Heart Arrhythmia, Congestive Heart Failure, Hypertension, Type II Failure, Hypertension, Type II Failure, Hypertension, Type II Diabetes, Rheumatoid Diabetes, Rheumatoid Diabetes, Rheumatoid Arthritis, Osteoarthritis, Arthritis, Osteoarthritis, Arthritis, Osteoarthritis, Neuropathy, Anorexia/bulimia, Neuropathy, Anorexia/bulimia, Neuropathy, Anorexia/bulimia, Confinement Anxiety Confinement Anxiety Confinement Anxiety Date Acquired: 03/09/2018 03/09/2018 03/08/2018 Weeks of Treatment: 7 7 7  Wound Status: Healed - Epithelialized Open Open Clustered Wound: Yes No No Measurements L x W x D 0x0x0 0.3x0.4x0.1 1x1.8x0.4 (cm) Area (cm) : 0 0.094 1.414 Volume (cm) : 0 0.009 0.565 % Reduction in Area: 100.00% 57.30% -36.40% % Reduction in Volume: 100.00% 59.10% -172.90% Classification: Grade 2 Grade 1  Category/Stage II Exudate Amount: None Present Medium Medium Exudate Type: N/A Serous Serous Exudate Color: N/A amber amber Wound Margin: Indistinct, nonvisible Flat and Intact Flat and Intact Granulation Amount: None Present (0%) Small (1-33%) None Present (0%) Granulation Quality: N/A Red N/A Solana, Varina P. ([id number]) Necrotic Amount: None Present (0%) Small (1-33%) Large (67-100%) Necrotic Tissue: N/A Adherent [place] Exposed Structures: Fat Layer (Subcutaneous Fat Layer (Subcutaneous Fat Layer (Subcutaneous Tissue) Exposed: Yes Tissue) Exposed: Yes Tissue) Exposed: Yes Fascia: No Fascia: No Fascia: No Tendon: No Tendon: No Tendon: No Muscle: No Muscle: No Muscle: No Joint: No Joint: No Joint: No Bone: No Bone: No Bone: No Epithelialization: None None None Periwound Skin Texture: Induration: Yes Excoriation: No Excoriation: No Excoriation: No Induration: No Induration: No Callus: No Callus: No Callus: No Crepitus: No Crepitus: No Crepitus: No Rash: No Rash: No Rash: No Scarring: No Scarring: No Scarring: No Periwound Skin Moisture: Dry/Scaly: Yes Maceration: No Maceration: No Maceration: No Dry/Scaly: No Dry/Scaly: No Periwound Skin Color: Rubor: Yes Rubor: Yes Atrophie Blanche: No Atrophie Blanche: No Atrophie Blanche: No Cyanosis: No Cyanosis: No Cyanosis: No Ecchymosis: No Ecchymosis: No Ecchymosis: No Erythema: No Erythema: No Erythema: No Hemosiderin Staining: No Hemosiderin Staining: No Hemosiderin Staining: No Mottled: No Mottled: No Mottled: No Pallor: No Pallor: No Pallor: No Rubor: No Erythema Location: N/A N/A N/A Temperature: No Abnormality No Abnormality No Abnormality Tenderness on Palpation: Yes Yes Yes Wound Number: 7 8 N/A Photos: N/A Wound Location: Right Calcaneus Sacrum - Midline N/A Wounding Event: Gradually Appeared Pressure Injury N/A 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 N/A Lower Extremity Secondary Etiology: Pressure Ulcer N/A N/A Comorbid History: Cataracts, Anemia, Cataracts, Anemia, N/A Arrhythmia, Congestive Heart Arrhythmia, Congestive Heart Failure, Hypertension, Type II Failure, Hypertension, Type II Diabetes, Rheumatoid Diabetes, Rheumatoid Arthritis, Osteoarthritis, Arthritis, Osteoarthritis, Neuropathy, Anorexia/bulimia, Neuropathy, Anorexia/bulimia, Confinement Anxiety Confinement Anxiety Date Acquired: 03/09/2018 05/04/2018 N/A Weeks of Treatment: 7 0 N/A Wound Status: Open Open N/A Clustered Wound: No No N/A Measurements L x W x D 1x0.8x0.1 3x4x0.1 N/A (cm) Area (cm) : 0.628 9.425 N/A Scioli, Lilyth P. ([id number]) Volume (cm) : 0.063 0.942 N/A % Reduction in Area: 96.20% N/A N/A % Reduction in Volume: 96.20% N/A N/A Classification: Grade 1 Category/Stage III N/A Exudate Amount: None Present Medium N/A Exudate Type: N/A Serous N/A Exudate Color: N/A amber N/A Wound Margin: Distinct, outline attached Flat and Intact N/A Granulation Amount: None Present (0%) Small (1-33%) N/A Granulation Quality: N/A Pink N/A Necrotic Amount: Large (67-100%) Large (67-100%) N/A Necrotic Tissue: Eschar Adherent Slough N/A Exposed Structures: Fascia: No Fat Layer (Subcutaneous N/A Fat Layer (Subcutaneous Tissue) Exposed: Yes Tissue) Exposed: No Fascia: No Tendon: No Tendon:  No Muscle: No Muscle: No Joint: No Joint: No Bone: No Bone: No Limited to Skin Breakdown Epithelialization: Large (67-100%) None N/A Periwound Skin Texture: Excoriation: No Scarring: Yes N/A Induration: No Excoriation: No Callus: No Induration: No Crepitus: No Callus: No Rash: No Crepitus: No Scarring: No Rash: No Periwound Skin Moisture: Dry/Scaly: Yes Maceration: No N/A Maceration: No Dry/Scaly: No Periwound Skin Color: Atrophie Blanche: No Erythema: Yes N/A Cyanosis: No Atrophie Blanche: No Ecchymosis: No Cyanosis: No Erythema:  No Ecchymosis: No Hemosiderin Staining: No Hemosiderin Staining: No Mottled: No Mottled: No Pallor: No Pallor: No Rubor: No Rubor: No Erythema Location: N/A Circumferential N/A Temperature: No Abnormality No Abnormality N/A Tenderness on Palpation: Yes No N/A Treatment Notes Electronic Signature(s) Signed: 05/11/2018 3:19:36 PM By: Montey Hora Entered By: Montey Hora on 05/11/2018 11:29:09 Rumler, Carlie Mamie Nick ([id number]) -------------------------------------------------------------------------------- Multi-Disciplinary Care Plan Details Patient Name: Pickney, Jaleiyah P. Date of Service: 05/11/2018 11:00 AM Medical Record Number: [id number] Patient Account Number: [id number] Date of Birth/Sex: 1933/11/11 (85 y.o. F) Treating RN: Montey Hora Primary Care Meryem Haertel: Emily Filbert Other Clinician: Referring Demetrick Eichenberger: Emily Filbert Treating Chidera Dearcos/Extender: Melburn Hake, HOYT Weeks in Treatment: 7 Active Inactive Abuse / Safety / Falls / Self Care Management Nursing Diagnoses: Potential for falls Goals: Patient will remain injury free related to falls Date Initiated: 03/23/2018 Target Resolution Date: 06/19/2018 Goal Status: Active Interventions: Assess fall risk on admission and as needed Notes: Orientation to the Wound Care Program Nursing Diagnoses: Knowledge deficit related to the wound healing center program Goals: Patient/caregiver will verbalize understanding of the [place] Program Date Initiated: 03/23/2018 Target Resolution Date: 06/19/2018 Goal Status: Active Interventions: Provide education on orientation to the wound center Notes: Venous Leg Ulcer Nursing Diagnoses: Actual venous Insuffiency (use after diagnosis is confirmed) Goals: Patient will maintain optimal edema control Date Initiated: 03/23/2018 Target Resolution Date: 06/19/2018 Goal Status: Active Interventions: Assess peripheral edema status every visit. MONTGOMERY, ROTHLISBERGER  ([id number]) Compression as ordered Notes: Wound/Skin Impairment Nursing Diagnoses: Impaired tissue integrity Goals: Ulcer/skin breakdown will heal within 14 weeks Date Initiated: 03/23/2018 Target Resolution Date: 06/19/2018 Goal Status: Active Interventions: Assess patient/caregiver ability to obtain necessary supplies Assess patient/caregiver ability to perform ulcer/skin care regimen upon admission and as needed Assess ulceration(s) every visit Notes: Electronic Signature(s) Signed: 05/11/2018 3:19:36 PM By: Montey Hora Entered By: Montey Hora on 05/11/2018 11:28:59 Depuy, Zilphia P. ([id number]) -------------------------------------------------------------------------------- Pain Assessment Details Patient Name: Loescher, Emmaly P. Date of Service: 05/11/2018 11:00 AM Medical Record Number: [id number] Patient Account Number: [id number] Date of Birth/Sex: 1933/08/22 (85 y.o. F) Treating RN: Harold Barban Primary Care Isaul Landi: Emily Filbert Other Clinician: Referring Lamesha Tibbits: Emily Filbert Treating Quinley Nesler/Extender: Melburn Hake, HOYT Weeks in Treatment: 7 Active Problems Location of Pain Severity and Description of Pain Patient Has Paino No Site Locations Pain Management and Medication Current Pain Management: Electronic Signature(s) Signed: 05/12/2018 4:45:14 PM By: Harold Barban Entered By: Harold Barban on 05/11/2018 11:05:42 Schwanke, [place] ([id number]) -------------------------------------------------------------------------------- Patient/Caregiver Education Details Patient Name: Oatis, Ramiya P. Date of Service: 05/11/2018 11:00 AM Medical Record Number: [id number] Patient Account Number: [id number] Date of Birth/Gender: February 10, 1933 (85 y.o. F) Treating RN: Montey Hora Primary Care Physician: Emily Filbert Other Clinician: Referring Physician: Emily Filbert Treating Physician/Extender: Sharalyn Ink in Treatment: 7 Education Assessment Education  Provided To: Patient and Caregiver Education Topics Provided Offloading: Handouts: Other: offloading bottom Methods: Explain/Verbal Responses: State content correctly Pressure: Handouts: Preventing Pressure Ulcers Methods: Explain/Verbal Responses: State content correctly Venous: Handouts: Other: use pumps Methods: Explain/Verbal Responses:  State content correctly Electronic Signature(s) Signed: 05/11/2018 3:19:36 PM By: Montey Hora Entered By: Montey Hora on 05/11/2018 11:53:49 Lovings, [place] ([id number]) -------------------------------------------------------------------------------- Wound Assessment Details Patient Name: Dragoo, Rhina P. Date of Service: 05/11/2018 11:00 AM Medical Record Number: [id number] Patient Account Number: [id number] Date of Birth/Sex: 1933-08-19 (85 y.o. F) Treating RN: Montey Hora Primary Care Adelard Sanon: Emily Filbert Other Clinician: Referring Nuha Degner: Emily Filbert Treating Zanetta Dehaan/Extender: Melburn Hake, HOYT Weeks in Treatment: 7 Wound Status Wound Number: 1 Primary Diabetic Wound/Ulcer of the Lower Extremity Etiology: Wound Location: Left, Dorsal Foot Wound Healed - Epithelialized Wounding Event: Gradually Appeared Status: Date Acquired: 03/09/2018 Comorbid Cataracts, Anemia, Arrhythmia, Congestive Heart Weeks Of Treatment: 7 History: Failure, Hypertension, Type II Diabetes, Clustered Wound: Yes Rheumatoid Arthritis, Osteoarthritis, Neuropathy, Anorexia/bulimia, Confinement Anxiety Photos Wound Measurements Length: (cm) 0 % R Width: (cm) 0 % R Depth: (cm) 0 Epi Area: (cm) 0 Tu Volume: (cm) 0 Un eduction in Area: 100% eduction in Volume: 100% thelialization: None nneling: No dermining: No Wound Description Classification: Grade 2 Wound Margin: Indistinct, nonvisible Exudate Amount: None Present Foul Odor After Cleansing: No Slough/Fibrino Yes Wound Bed Granulation Amount: None Present (0%) Exposed  Structure Necrotic Amount: None Present (0%) Fascia Exposed: No Fat Layer (Subcutaneous Tissue) Exposed: Yes Tendon Exposed: No Muscle Exposed: No Joint Exposed: No Bone Exposed: No Periwound Skin Texture Texture Color No Abnormalities Noted: No No Abnormalities Noted: No Farias, Alexsia P. ([id number]) Callus: No Atrophie Blanche: No Crepitus: No Cyanosis: No Excoriation: No Ecchymosis: No Induration: Yes Erythema: No Rash: No Hemosiderin Staining: No Scarring: No Mottled: No Pallor: No Moisture Rubor: Yes No Abnormalities Noted: No Dry / Scaly: Yes Temperature / Pain Maceration: No Temperature: No Abnormality Tenderness on Palpation: Yes Electronic Signature(s) Signed: 05/11/2018 3:19:36 PM By: Montey Hora Entered By: Montey Hora on 05/11/2018 11:28:49 Kister, Paulina P. ([id number]) -------------------------------------------------------------------------------- Wound Assessment Details Patient Name: Gunderman, Crescentia P. Date of Service: 05/11/2018 11:00 AM Medical Record Number: [id number] Patient Account Number: [id number] Date of Birth/Sex: [birth year] (85 y.o. F) Treating RN: Harold Barban Primary Care Rilla Buckman: Emily Filbert Other Clinician: Referring Tysha Grismore: Emily Filbert Treating Corday Wyka/Extender: Melburn Hake, HOYT Weeks in Treatment: 7 Wound Status Wound Number: 2 Primary Diabetic Wound/Ulcer of the Lower Extremity Etiology: Wound Location: Left Toe Second - Dorsal Wound Open Wounding Event: Gradually Appeared Status: Date Acquired: 03/09/2018 Comorbid Cataracts, Anemia, Arrhythmia, Congestive Heart Weeks Of Treatment: 7 History: Failure, Hypertension, Type II Diabetes, Clustered Wound: No Rheumatoid Arthritis, Osteoarthritis, Neuropathy, Anorexia/bulimia, Confinement Anxiety Photos Wound Measurements Length: (cm) 0.3 % Reduction in Width: (cm) 0.4 % Reduction in Depth: (cm) 0.1 Epithelializat Area: (cm) 0.094 Tunneling: Volume: (cm)  0.009 Undermining: Area: 57.3% Volume: 59.1% ion: None No No Wound Description Classification: Grade 1 Foul Odor Afte Wound Margin: Flat and Intact Slough/Fibrino Exudate Amount: Medium Exudate Type: Serous Exudate Color: amber r Cleansing: No Yes Wound Bed Granulation Amount: Small (1-33%) Exposed Structure Granulation Quality: Red Fascia Exposed: No Necrotic Amount: Small (1-33%) Fat Layer (Subcutaneous Tissue) Exposed: Yes Necrotic Quality: Adherent Slough Tendon Exposed: No Muscle Exposed: No Joint Exposed: No Bone Exposed: No Periwound Skin Texture Homewood, Eddye P. ([id number]) Texture Color No Abnormalities Noted: No No Abnormalities Noted: No Callus: No Atrophie Blanche: No Crepitus: No Cyanosis: No Excoriation: No Ecchymosis: No Induration: No Erythema: No Rash: No Hemosiderin Staining: No Scarring: No Mottled: No Pallor: No Moisture Rubor: Yes No Abnormalities Noted: No Dry / Scaly: No Temperature / Pain Maceration: No Temperature: No Abnormality Tenderness on Palpation: Yes Electronic Signature(s) Signed:  05/12/2018 4:45:14 PM By: Harold Barban Entered By: Harold Barban on 05/11/2018 11:19:17 Merrick, Magon Mamie Nick ([id number]) -------------------------------------------------------------------------------- Wound Assessment Details Patient Name: Stryker, Dashauna P. Date of Service: 05/11/2018 11:00 AM Medical Record Number: [id number] Patient Account Number: [id number] Date of Birth/Sex: [birth year] (85 y.o. F) Treating RN: Harold Barban Primary Care Jorrell Kuster: Emily Filbert Other Clinician: Referring Garima Chronis: Emily Filbert Treating Bharath Bernstein/Extender: Melburn Hake, HOYT Weeks in Treatment: 7 Wound Status Wound Number: 6 Primary Pressure Ulcer Etiology: Wound Location: Left Calcaneus Wound Open Wounding Event: Gradually Appeared Status: Date Acquired: 03/08/2018 Comorbid Cataracts, Anemia, Arrhythmia, Congestive Heart Weeks Of Treatment:  7 History: Failure, Hypertension, Type II Diabetes, Clustered Wound: No Rheumatoid Arthritis, Osteoarthritis, Neuropathy, Anorexia/bulimia, Confinement Anxiety Photos Wound Measurements Length: (cm) 1 % Reduction in Width: (cm) 1.8 % Reduction in Depth: (cm) 0.4 Epithelializati Area: (cm) 1.414 Tunneling: Volume: (cm) 0.565 Undermining: Area: -36.4% Volume: -172.9% on: None No No Wound Description Classification: Category/Stage III Foul Odor After Wound Margin: Flat and Intact Slough/Fibrino Exudate Amount: Medium Exudate Type: Serous Exudate Color: amber Cleansing: No Yes Wound Bed Granulation Amount: Medium (34-66%) Exposed Structure Granulation Quality: Red Fascia Exposed: No Necrotic Amount: Medium (34-66%) Fat Layer (Subcutaneous Tissue) Exposed: Yes Necrotic Quality: Adherent Slough Tendon Exposed: No Muscle Exposed: No Joint Exposed: No Bone Exposed: No Periwound Skin Texture Zambito, Javeah P. ([id number]) Texture Color No Abnormalities Noted: No No Abnormalities Noted: No Callus: No Atrophie Blanche: No Crepitus: No Cyanosis: No Excoriation: No Ecchymosis: No Induration: No Erythema: No Rash: No Hemosiderin Staining: No Scarring: No Mottled: No Pallor: No Moisture Rubor: No No Abnormalities Noted: No Dry / Scaly: No Temperature / Pain Maceration: No Temperature: No Abnormality Tenderness on Palpation: Yes Electronic Signature(s) Signed: 05/12/2018 7:59:38 AM By: Worthy Keeler PA-C Signed: 05/12/2018 4:45:14 PM By: Harold Barban Entered By: Worthy Keeler on 05/11/2018 11:47:51 Clayborn, Laporsche P. ([id number]) -------------------------------------------------------------------------------- Wound Assessment Details Patient Name: Palm, Arminta P. Date of Service: 05/11/2018 11:00 AM Medical Record Number: [id number] Patient Account Number: [id number] Date of Birth/Sex: [birth year] (85 y.o. F) Treating RN: Harold Barban Primary Care  Kearston Putman: Emily Filbert Other Clinician: Referring Marston Mccadden: Emily Filbert Treating Clydene Burack/Extender: Melburn Hake, HOYT Weeks in Treatment: 7 Wound Status Wound Number: 7 Primary Pressure Ulcer Etiology: Wound Location: Right Calcaneus Wound Open Wounding Event: Gradually Appeared Status: Date Acquired: 03/09/2018 Comorbid Cataracts, Anemia, Arrhythmia, Congestive Heart Weeks Of Treatment: 7 History: Failure, Hypertension, Type II Diabetes, Clustered Wound: No Rheumatoid Arthritis, Osteoarthritis, Neuropathy, Anorexia/bulimia, Confinement Anxiety Photos Wound Measurements Length: (cm) 1 Width: (cm) 0.8 Depth: (cm) 0.1 Area: (cm) 0.628 Volume: (cm) 0.063 % Reduction in Area: 96.2% % Reduction in Volume: 96.2% Epithelialization: Large (67-100%) Tunneling: No Undermining: No Wound Description Classification: Category/Stage III Wound Margin: Distinct, outline attached Exudate Amount: None Present Foul Odor After Cleansing: No Slough/Fibrino No Wound Bed Granulation Amount: Small (1-33%) Exposed Structure Granulation Quality: Red Fascia Exposed: No Necrotic Amount: Large (67-100%) Fat Layer (Subcutaneous Tissue) Exposed: Yes Necrotic Quality: Eschar, Adherent Slough Tendon Exposed: No Muscle Exposed: No Joint Exposed: No Bone Exposed: No Periwound Skin Texture Texture Color No Abnormalities Noted: No No Abnormalities Noted: No Crewe, Derisha P. ([id number]) Callus: No Atrophie Blanche: No Crepitus: No Cyanosis: No Excoriation: No Ecchymosis: No Induration: No Erythema: No Rash: No Hemosiderin Staining: No Scarring: No Mottled: No Pallor: No Moisture Rubor: No No Abnormalities Noted: No Dry / Scaly: Yes Temperature / Pain Maceration: No Temperature: No Abnormality Tenderness on Palpation: Yes Electronic Signature(s) Signed: 05/12/2018 7:59:38 AM By: Worthy Keeler PA-C  Signed: 05/12/2018 4:45:14 PM By: Harold Barban Entered By: Worthy Keeler on  05/11/2018 11:48:54 Margolis, Ryland PMarland Kitchen ([id number]) -------------------------------------------------------------------------------- Wound Assessment Details Patient Name: Pascuzzi, Tammala P. Date of Service: 05/11/2018 11:00 AM Medical Record Number: [id number] Patient Account Number: [id number] Date of Birth/Sex: 1933-07-19 (85 y.o. F) Treating RN: Harold Barban Primary Care Makinze Jani: Emily Filbert Other Clinician: Referring Chetan Mehring: Emily Filbert Treating Shanta Hartner/Extender: Melburn Hake, HOYT Weeks in Treatment: 7 Wound Status Wound Number: 8 Primary Pressure Ulcer Etiology: Wound Location: Sacrum - Midline Wound Open Wounding Event: Pressure Injury Status: Date Acquired: 05/04/2018 Comorbid Cataracts, Anemia, Arrhythmia, Congestive Heart Weeks Of Treatment: 0 History: Failure, Hypertension, Type II Diabetes, Clustered Wound: No Rheumatoid Arthritis, Osteoarthritis, Neuropathy, Anorexia/bulimia, Confinement Anxiety Photos Wound Measurements Length: (cm) 3 % Reduction in Width: (cm) 4 % Reduction in Depth: (cm) 0.1 Epithelializati Area: (cm) 9.425 Tunneling: Volume: (cm) 0.942 Undermining: Area: Volume: on: None No No Wound Description Classification: Category/Stage III Foul Odor After Wound Margin: Flat and Intact Slough/Fibrino Exudate Amount: Medium Exudate Type: Serous Exudate Color: amber Cleansing: No Yes Wound Bed Granulation Amount: Small (1-33%) Exposed Structure Granulation Quality: Pink Fascia Exposed: No Necrotic Amount: Large (67-100%) Fat Layer (Subcutaneous Tissue) Exposed: Yes Necrotic Quality: Adherent Slough Tendon Exposed: No Muscle Exposed: No Joint Exposed: No Bone Exposed: No Periwound Skin Texture Gorka, Magdelene P. ([id number]) Texture Color No Abnormalities Noted: No No Abnormalities Noted: No Callus: No Atrophie Blanche: No Crepitus: No Cyanosis: No Excoriation: No Ecchymosis: No Induration: No Erythema: Yes Rash:  No Erythema Location: Circumferential Scarring: Yes Hemosiderin Staining: No Mottled: No Moisture Pallor: No No Abnormalities Noted: No Rubor: No Dry / Scaly: No Maceration: No Temperature / Pain Temperature: No Abnormality Electronic Signature(s) Signed: 05/12/2018 4:45:14 PM By: Harold Barban Entered By: Harold Barban on 05/11/2018 11:18:21 Cherney, Aleida P. ([id number]) -------------------------------------------------------------------------------- Vitals Details Patient Name: Hara, Ixel P. Date of Service: 05/11/2018 11:00 AM Medical Record Number: [id number] Patient Account Number: [id number] Date of Birth/Sex: Mar 10, 1933 (85 y.o. F) Treating RN: Harold Barban Primary Care Robina Hamor: Emily Filbert Other Clinician: Referring Keyairra Kolinski: Emily Filbert Treating Kerisha Goughnour/Extender: Melburn Hake, HOYT Weeks in Treatment: 7 Vital Signs Time Taken: 11:00 Temperature (F): 97.6 Height (in): 65 Pulse (bpm): 59 Weight (lbs): 155 Respiratory Rate (breaths/min): 20 Body Mass Index (BMI): 25.8 Blood Pressure (mmHg): 189/59 Reference Range: 80 - 120 mg / dl Electronic Signature(s) Signed: 05/12/2018 4:45:14 PM By: Harold Barban Entered By: Harold Barban on 05/11/2018 11:06:02

## 2018-05-12 NOTE — Progress Notes (Signed)
HAMNA, ASA ([id number]) Visit Report for 05/11/2018 Chief Complaint Document Details Patient Name: Sharon [name], [name] Date of Service: 05/11/2018 11:00 AM Medical Record Number: [id number] Patient Account Number: [id number] Date of Birth/Sex: Mar 03, 1933 (85 y.o. F) Treating RN: Montey Hora Primary Care Provider: Emily Filbert Other Clinician: Referring Provider: Emily Filbert Treating Provider/Extender: Melburn Hake, Stephanye Finnicum Weeks in Treatment: 7 Information Obtained from: Patient Chief Complaint Multiple bilateral LE ulcers and sacral ulcer Electronic Signature(s) Signed: 05/12/2018 7:59:38 AM By: Worthy Keeler PA-C Entered By: Worthy Keeler on 05/11/2018 11:46:41 Degregory, Prudie P. ([id number]) -------------------------------------------------------------------------------- HPI Details Patient Name: Sharon [name], [name] Date of Service: 05/11/2018 11:00 AM Medical Record Number: [id number] Patient Account Number: [id number] Date of Birth/Sex: 1933/10/15 (85 y.o. F) Treating RN: Montey Hora Primary Care Provider: Emily Filbert Other Clinician: Referring Provider: Emily Filbert Treating Provider/Extender: Melburn Hake, Livian Vanderbeck Weeks in Treatment: 7 History of Present Illness HPI Description: 03/23/18 On evaluation today patient presents for initial evaluation our clinic concerning issues that she's been having with the bilateral lower extremities. She has been out of the hospital for about a week and was there for about four days due to fluid overload. She does have congestive heart failure, atrial fibrillation, diabetes mellitus type II, and a right first toe amputation. Currently she is having bilateral lower extremity weeping and swelling. During her time in the hospital that she took her off of her diuretic medications according to what her son is telling me today. With that being said her primary care provider, Dr. Sabra Heck, has placed her back on some of these medications according to what  her son is telling me. She has undergone previous testing including hemoglobin A1c of 7.6 on 01/11/18. She's also had arterial studies which show that her right ABI was 0.89 and the left ABI of 0.94 this obviously is good as well. Currently the biggest concern with both the patient and her son are issues with overall general fluid overload and the fact that just wrapping her legs or managing the legs as we would do here in the clinic will not treat the overall fluid overload issue which I completely agree with. With that being said I think that we definitely need to try to get these areas closed as quickly as possible in order to prevent any chance for infection/cellulitis. 03/30/18 on evaluation today patient actually appears to be doing much better in regard to her lower extremity ulcers. She's been tolerating the dressing changes at this point without complication overall I do not see any signs of anything worsening which is good news. Nonetheless I am hopeful that she will continue to make good improvement she still has a lot of drainage we may want to increase her compression wrap to something a little bit stronger. 04/13/18 on evaluation today patient appears to be doing very well in regard to her lower extremity ulcers. The hill ulcers appear to be the worst but there still making good improvement overall in my pinion. Fortunately there's no evidence of active infection at this time in her swelling seems to be well controlled with the compression wrap. Overall very pleased in this regard. 04/27/18 on evaluation today patient actually appears to be doing better in regard to the majority for wounds on evaluation today. The one caveat is that her heels don't appear to be doing quite as well the left heel in particular is a little bit larger than previous. Fortunately there's no signs of active infection at this time. Overall she's been  tolerating the dressing changes without complication. I did  discuss offloading of the hills with her again today she has not been using a pillow under her legs to float the heels I think this would be of benefit for her. 05/11/18 on evaluation today patient actually appears to be doing well in regard to her lower extremities bilaterally. Unfortunately her heels are still open although they appear to be doing somewhat better as well. She does have a new ulcer in the sacral region which is something that was not needed on previous evaluation. Unfortunately when she saw vascular they also discontinued the wraps and recommended compression stockings. However neither wraps nor compression stockings have been currently utilized since she was last seen here in our clinic. She has a tremendous amount of swelling noted in the bilateral lower extremities at this point as a result and unfortunately. Nonetheless the pressure ulcer likely is due to the fact that she's been sitting in a wheelchair for an extended period of time quite frequently. Electronic Signature(s) Signed: 05/12/2018 7:59:38 AM By: Worthy Keeler PA-C Entered By: Worthy Keeler on 05/11/2018 13:18:33 Sharon [name], Sharon [name] ([id number]) -------------------------------------------------------------------------------- Physical Exam Details Patient Name: Sharon [name], [name] Date of Service: 05/11/2018 11:00 AM Medical Record Number: [id number] Patient Account Number: [id number] Date of Birth/Sex: 1933-11-30 (85 y.o. F) Treating RN: Montey Hora Primary Care Provider: Emily Filbert Other Clinician: Referring Provider: Emily Filbert Treating Provider/Extender: Melburn Hake, Irva Loser Weeks in Treatment: 7 Constitutional Well-nourished and well-hydrated in no acute distress. Respiratory normal breathing without difficulty. clear to auscultation bilaterally. Cardiovascular regular rate and rhythm with normal S1, S2. Psychiatric this patient is able to make decisions and demonstrates good insight into disease  process. Alert and Oriented x 3. pleasant and cooperative. Notes Upon inspection patient's wound on the sacral region appears to be a stage III pressure ulcer but does not appear to go to deeply which is good news. Patient's bilateral lower extremities showed evidence of significant lower extremity edema which was 2-3+ pitting. Her heel ulcer's do not appear to be doing too badly there was minimal necrotic tissue noted after mechanical debridement with saline and gauze. Electronic Signature(s) Signed: 05/12/2018 7:59:38 AM By: Worthy Keeler PA-C Entered By: Worthy Keeler on 05/11/2018 13:19:10 Legan, Sharon [name] ([id number]) -------------------------------------------------------------------------------- Physician Orders Details Patient Name: Sharon [name], [name] Date of Service: 05/11/2018 11:00 AM Medical Record Number: [id number] Patient Account Number: [id number] Date of Birth/Sex: November 01, 1933 (85 y.o. F) Treating RN: Montey Hora Primary Care Provider: Emily Filbert Other Clinician: Referring Provider: Emily Filbert Treating Provider/Extender: Melburn Hake, Burr Soffer Weeks in Treatment: 7 Verbal / Phone Orders: No Diagnosis Coding ICD-10 Coding Code Description E11.621 Type 2 diabetes mellitus with foot ulcer I89.0 Lymphedema, not elsewhere classified L97.822 Non-pressure chronic ulcer of other part of left lower leg with fat layer exposed L89.153 Pressure ulcer of sacral region, stage 3 L97.812 Non-pressure chronic ulcer of other part of right lower leg with fat layer exposed L97.512 Non-pressure chronic ulcer of other part of right foot with fat layer exposed L97.522 Non-pressure chronic ulcer of other part of left foot with fat layer exposed I73.89 Other specified peripheral vascular diseases Wound Cleansing Wound #2 Left,Dorsal Toe Second o Cleanse wound with mild soap and water Wound #6 Left Calcaneus o Cleanse wound with mild soap and water Wound #7 Right Calcaneus o  Cleanse wound with mild soap and water Skin Barriers/Peri-Wound Care Wound #2 Left,Dorsal Toe Second o Moisturizing lotion Wound #6 Left Calcaneus o Moisturizing lotion  Wound #7 Right Calcaneus o Moisturizing lotion Primary Wound Dressing Wound #2 Left,Dorsal Toe Second o Silver Alginate Wound #6 Left Calcaneus o Silver Alginate Wound #7 Right Calcaneus o Silver Alginate Wound #8 Midline Sacrum Templin, Bellamy P. ([id number]) o Silver Alginate Secondary Dressing Wound #6 Left Calcaneus o ABD pad Wound #7 Right Calcaneus o ABD pad Wound #8 Midline Sacrum o Boardered Foam Dressing Dressing Change Frequency Wound #2 Left,Dorsal Toe Second o Other: - twice weekly - Tuesdays and Fridays Wound #6 Left Calcaneus o Other: - twice weekly - Tuesdays and Fridays Wound #7 Right Calcaneus o Other: - twice weekly - Tuesdays and Fridays Wound #8 Midline Sacrum o Other: - twice weekly - Tuesdays and Fridays Follow-up Appointments Wound #2 Left,Dorsal Toe Second o Return Appointment in 2 weeks. - HHRN to visit patient on Tuesdays and Fridays when patient does not come into [facility] and Fridays when she does come into clinic Wound #6 Left Calcaneus o Return Appointment in 2 weeks. - HHRN to visit patient on Tuesdays and Fridays when patient does not come into [facility] and Fridays when she does come into clinic Wound #7 Right Calcaneus o Return Appointment in 2 weeks. - HHRN to visit patient on Tuesdays and Fridays when patient does not come into [facility] and Fridays when she does come into clinic Wound #8 Midline Sacrum o Return Appointment in 2 weeks. - HHRN to visit patient on Tuesdays and Fridays when patient does not come into [facility] and Fridays when she does come into clinic Edema Control Wound #6 Left Calcaneus o 3 Layer Compression System - Bilateral - Please wrap from toes  to 3cm below the knee o Compression Pump: Use compression pump on left lower extremity for 30 minutes, twice daily. - USE PUMPS TWICE DAILY FOR 1 HOUR EACH TIME o Compression Pump: Use compression pump on right lower extremity for 30 minutes, twice daily. - USE PUMPS TWICE DAILY FOR 1 HOUR EACH TIME Wound #7 Right Calcaneus o 3 Layer Compression System - Bilateral - Please wrap from toes to 3cm below the knee Off-Loading Wound #8 Midline Sacrum Sharon [name], [name] ([id number]) o Turn and reposition every 2 hours - DO NOT SIT FOR LONGER THAN 2 HOURS. Home Health Wound #2 Left,Dorsal Toe [place] Visits - [place] Nurse may visit PRN to address patientos wound care needs. o FACE TO FACE ENCOUNTER: MEDICARE and MEDICAID PATIENTS: I certify that this patient is under my care and that I had a face-to-face encounter that meets the physician face-to-face encounter requirements with this patient on this date. The encounter with the patient was in whole or in part for the following MEDICAL CONDITION: (primary reason for [place]) MEDICAL NECESSITY: I certify, that based on my findings, NURSING services are a medically necessary home health service. HOME BOUND STATUS: I certify that my clinical findings support that this patient is homebound (i.e., Due to illness or injury, pt requires aid of supportive devices such as crutches, cane, wheelchairs, walkers, the use of special transportation or the assistance of another person to leave their place of residence. There is a normal inability to leave the home and doing so requires considerable and taxing effort. Other absences are for medical reasons / religious services and are infrequent or of short duration when for other reasons). o If current dressing causes regression in wound condition, may D/C ordered dressing product/s and  apply Normal Saline Moist Dressing daily until next [place] / Other MD appointment. [place] of regression in wound condition at [phone]. o Please direct any NON-WOUND related issues/requests for orders to patient's Primary Care Physician Wound #6 Left Calcaneus o [place] Visits - [place] Nurse may visit PRN to address patientos wound care needs. o FACE TO FACE ENCOUNTER: MEDICARE and MEDICAID PATIENTS: I certify that this patient is under my care and that I had a face-to-face encounter that meets the physician face-to-face encounter requirements with this patient on this date. The encounter with the patient was in whole or in part for the following MEDICAL CONDITION: (primary reason for [place]) MEDICAL NECESSITY: I certify, that based on my findings, NURSING services are a medically necessary home health service. HOME BOUND STATUS: I certify that my clinical findings support that this patient is homebound (i.e., Due to illness or injury, pt requires aid of supportive devices such as crutches, cane, wheelchairs, walkers, the use of special transportation or the assistance of another person to leave their place of residence. There is a normal inability to leave the home and doing so requires considerable and taxing effort. Other absences are for medical reasons / religious services and are infrequent or of short duration when for other reasons). o If current dressing causes regression in wound condition, may D/C ordered dressing product/s and apply Normal Saline Moist Dressing daily until next [place] / Other MD appointment. [place] of regression in wound condition at [phone]. o Please direct any NON-WOUND related issues/requests for orders to patient's Primary Care Physician Wound #7 Right Calcaneus o [place] Visits - [place] Nurse may visit PRN to address patientos wound care needs. o FACE TO FACE  ENCOUNTER: MEDICARE and MEDICAID PATIENTS: I certify that this patient is under my care and that I had a face-to-face encounter that meets the physician face-to-face encounter requirements with this patient on this date. The encounter with the patient was in whole or in part for the following MEDICAL CONDITION: (primary reason for [place]) MEDICAL NECESSITY: I certify, that based on my findings, NURSING services are a medically necessary home health service. HOME BOUND STATUS: I certify that my clinical findings support that this patient is homebound (i.e., Due to illness or injury, pt requires aid of supportive devices such as crutches, cane, wheelchairs, walkers, the use of special transportation or the assistance of another person to leave their place of residence. There is a normal inability to leave the home and doing so requires considerable and taxing effort. Other absences are for medical reasons / religious services and are infrequent or of short duration when for other reasons). o If current dressing causes regression in wound condition, may D/C ordered dressing product/s and apply Normal Saline Moist Dressing daily until next [place] / Other MD appointment. [place] of regression in wound condition at [phone]. o Please direct any NON-WOUND related issues/requests for orders to patient's Primary Care Physician Wound #8 Midline Sacrum Sharon [name], Sharon [name] ([id number]) o [place] Visits - [place] Nurse may visit PRN to address patientos wound care needs. o FACE TO FACE ENCOUNTER: MEDICARE and MEDICAID PATIENTS: I certify that this patient is under my care and that I had a face-to-face encounter that meets the physician face-to-face encounter requirements with this patient on this date. The encounter with the patient was  in whole or in part for the following MEDICAL CONDITION: (primary reason for Home  Healthcare) MEDICAL NECESSITY: I certify, that based on my findings, NURSING services are a medically necessary home health service. HOME BOUND STATUS: I certify that my clinical findings support that this patient is homebound (i.e., Due to illness or injury, pt requires aid of supportive devices such as crutches, cane, wheelchairs, walkers, the use of special transportation or the assistance of another person to leave their place of residence. There is a normal inability to leave the home and doing so requires considerable and taxing effort. Other absences are for medical reasons / religious services and are infrequent or of short duration when for other reasons). o If current dressing causes regression in wound condition, may D/C ordered dressing product/s and apply Normal Saline Moist Dressing daily until next [place] / Other MD appointment. [place] of regression in wound condition at [phone]. o Please direct any NON-WOUND related issues/requests for orders to patient's Primary Care Physician Electronic Signature(s) Signed: 05/11/2018 3:19:36 PM By: Montey Hora Signed: 05/12/2018 7:59:38 AM By: Worthy Keeler PA-C Entered By: Montey Hora on 05/11/2018 11:54:55 Sharon [name], [name] ([id number]) -------------------------------------------------------------------------------- Problem List Details Patient Name: [place], Samah P. Date of Service: 05/11/2018 11:00 AM Medical Record Number: [id number] Patient Account Number: [id number] Date of Birth/Sex: 22-Oct-1933 (85 y.o. F) Treating RN: Montey Hora Primary Care Provider: Emily Filbert Other Clinician: Referring Provider: Emily Filbert Treating Provider/Extender: Melburn Hake, September Mormile Weeks in Treatment: 7 Active Problems ICD-10 Evaluated Encounter Code Description Active Date Today Diagnosis E11.621 Type 2 diabetes mellitus with foot ulcer 03/23/2018 No Yes I89.0 Lymphedema, not elsewhere classified  03/23/2018 No Yes L89.153 Pressure ulcer of sacral region, stage 3 05/11/2018 No Yes L97.522 Non-pressure chronic ulcer of other part of left foot with fat 03/23/2018 No Yes layer exposed L89.623 Pressure ulcer of left heel, stage 3 05/11/2018 No Yes L89.613 Pressure ulcer of right heel, stage 3 05/11/2018 No Yes I73.89 Other specified peripheral vascular diseases 03/23/2018 No Yes Inactive Problems ICD-10 Code Description Active Date Inactive Date L97.812 Non-pressure chronic ulcer of other part of right lower leg with fat 03/23/2018 03/23/2018 layer exposed L97.522 Non-pressure chronic ulcer of other part of righ foot with fat layer 03/23/2018 03/23/2018 exposed Sharon [name], [name] ([id number]) Resolved Problems ICD-10 Code Description Active Date Resolved Date L97.822 Non-pressure chronic ulcer of other part of left lower leg with fat 03/23/2018 03/23/2018 layer exposed Electronic Signature(s) Signed: 05/12/2018 7:59:38 AM By: Worthy Keeler PA-C Entered By: Worthy Keeler on 05/11/2018 11:46:28 Sharon [name], [name] ([id number]) -------------------------------------------------------------------------------- Progress Note Details Patient Name: Sharon [name], [name] Date of Service: 05/11/2018 11:00 AM Medical Record Number: [id number] Patient Account Number: [id number] Date of Birth/Sex: 1933-01-28 (85 y.o. F) Treating RN: Montey Hora Primary Care Provider: Emily Filbert Other Clinician: Referring Provider: Emily Filbert Treating Provider/Extender: Melburn Hake, Mareesa Gathright Weeks in Treatment: 7 Subjective Chief Complaint Information obtained from Patient Multiple bilateral LE ulcers and sacral ulcer History of Present Illness (HPI) 03/23/18 On evaluation today patient presents for initial evaluation our clinic concerning issues that she's been having with the bilateral lower extremities. She has been out of the hospital for about a week and was there for about four days due to fluid overload. She does  have congestive heart failure, atrial fibrillation, diabetes mellitus type II, and a right first toe amputation. Currently she is having bilateral lower extremity weeping and swelling. During her time in the hospital that she took her  off of her diuretic medications according to what her son is telling me today. With that being said her primary care provider, Dr. Sabra Heck, has placed her back on some of these medications according to what her son is telling me. She has undergone previous testing including hemoglobin A1c of 7.6 on 01/11/18. She's also had arterial studies which show that her right ABI was 0.89 and the left ABI of 0.94 this obviously is good as well. Currently the biggest concern with both the patient and her son are issues with overall general fluid overload and the fact that just wrapping her legs or managing the legs as we would do here in the clinic will not treat the overall fluid overload issue which I completely agree with. With that being said I think that we definitely need to try to get these areas closed as quickly as possible in order to prevent any chance for infection/cellulitis. 03/30/18 on evaluation today patient actually appears to be doing much better in regard to her lower extremity ulcers. She's been tolerating the dressing changes at this point without complication overall I do not see any signs of anything worsening which is good news. Nonetheless I am hopeful that she will continue to make good improvement she still has a lot of drainage we may want to increase her compression wrap to something a little bit stronger. 04/13/18 on evaluation today patient appears to be doing very well in regard to her lower extremity ulcers. The hill ulcers appear to be the worst but there still making good improvement overall in my pinion. Fortunately there's no evidence of active infection at this time in her swelling seems to be well controlled with the compression wrap. Overall  very pleased in this regard. 04/27/18 on evaluation today patient actually appears to be doing better in regard to the majority for wounds on evaluation today. The one caveat is that her heels don't appear to be doing quite as well the left heel in particular is a little bit larger than previous. Fortunately there's no signs of active infection at this time. Overall she's been tolerating the dressing changes without complication. I did discuss offloading of the hills with her again today she has not been using a pillow under her legs to float the heels I think this would be of benefit for her. 05/11/18 on evaluation today patient actually appears to be doing well in regard to her lower extremities bilaterally. Unfortunately her heels are still open although they appear to be doing somewhat better as well. She does have a new ulcer in the sacral region which is something that was not needed on previous evaluation. Unfortunately when she saw vascular they also discontinued the wraps and recommended compression stockings. However neither wraps nor compression stockings have been currently utilized since she was last seen here in our clinic. She has a tremendous amount of swelling noted in the bilateral lower extremities at this point as a result and unfortunately. Nonetheless the pressure ulcer likely is due to the fact that she's been sitting in a wheelchair for an extended period of time quite frequently. Patient History Information obtained from Patient. Family History DAFFNEY, GREENLY ([id number]) Hypertension - Child,Mother, Stroke - Father, No family history of Cancer, Diabetes, Kidney Disease, Lung Disease, Seizures, Thyroid Problems, Tuberculosis. Social History Former smoker - 30 years ago, Marital Status - Widowed, Alcohol Use - Never, Drug Use - No History, Caffeine Use - Rarely. Medical History Eyes Patient has history of Cataracts Denies  history of Glaucoma, Optic  Neuritis Ear/Nose/Mouth/Throat Denies history of Chronic sinus problems/congestion, Middle ear problems Hematologic/Lymphatic Patient has history of Anemia Denies history of Hemophilia, Human Immunodeficiency Virus, Lymphedema, Sickle Cell Disease Respiratory Denies history of Aspiration, Asthma, Chronic Obstructive Pulmonary Disease (COPD), Pneumothorax, Sleep Apnea, Tuberculosis Cardiovascular Patient has history of Arrhythmia - A. fib, Congestive Heart Failure, Hypertension Denies history of Angina, Hypotension, Myocardial Infarction, Peripheral Arterial Disease, Peripheral Venous Disease, Phlebitis, Vasculitis Gastrointestinal Denies history of Cirrhosis , Colitis, Crohn s, Hepatitis A, Hepatitis B, Hepatitis C Endocrine Patient has history of Type II Diabetes - oral medication Genitourinary Denies history of End Stage Renal Disease Immunological Denies history of Lupus Erythematosus, Raynaud s, Scleroderma Integumentary (Skin) Denies history of History of Burn, History of pressure wounds Musculoskeletal Patient has history of Rheumatoid Arthritis, Osteoarthritis Denies history of Gout, Osteomyelitis Neurologic Patient has history of Neuropathy Denies history of Dementia, Quadriplegia, Paraplegia, Seizure Disorder Oncologic Denies history of Received Chemotherapy, Received Radiation Psychiatric Patient has history of Anorexia/bulimia, Confinement Anxiety Hospitalization/Surgery History - Fluid retention. Review of Systems (ROS) Constitutional Symptoms (General Health) Denies complaints or symptoms of Fatigue, Fever, Chills, Marked Weight Change. Respiratory Denies complaints or symptoms of Chronic or frequent coughs, Shortness of Breath. Cardiovascular Complains or has symptoms of LE edema. Denies complaints or symptoms of Chest pain. Psychiatric Denies complaints or symptoms of Anxiety, Claustrophobia. Sharon [name], [name]  ([id number]) Objective Constitutional Well-nourished and well-hydrated in no acute distress. Vitals Time Taken: 11:00 AM, Height: 65 in, Weight: 155 lbs, BMI: 25.8, Temperature: 97.6 F, Pulse: 59 bpm, Respiratory Rate: 20 breaths/min, Blood Pressure: 189/59 mmHg. Respiratory normal breathing without difficulty. clear to auscultation bilaterally. Cardiovascular regular rate and rhythm with normal S1, S2. Psychiatric this patient is able to make decisions and demonstrates good insight into disease process. Alert and Oriented x 3. pleasant and cooperative. General Notes: Upon inspection patient's wound on the sacral region appears to be a stage III pressure ulcer but does not appear to go to deeply which is good news. Patient's bilateral lower extremities showed evidence of significant lower extremity edema which was 2-3+ pitting. Her heel ulcer's do not appear to be doing too badly there was minimal necrotic tissue noted after mechanical debridement with saline and gauze. Integumentary (Hair, Skin) Wound #1 status is Healed - Epithelialized. Original cause of wound was Gradually Appeared. The wound is located on the Left,Dorsal Foot. The wound measures 0cm length x 0cm width x 0cm depth; 0cm^2 area and 0cm^3 volume. There is Fat Layer (Subcutaneous Tissue) Exposed exposed. There is no tunneling or undermining noted. There is a none present amount of drainage noted. The wound margin is indistinct and nonvisible. There is no granulation within the wound bed. There is no necrotic tissue within the wound bed. The periwound skin appearance exhibited: Induration, Dry/Scaly, Rubor. The periwound skin appearance did not exhibit: Callus, Crepitus, Excoriation, Rash, Scarring, Maceration, Atrophie Blanche, Cyanosis, Ecchymosis, Hemosiderin Staining, Mottled, Pallor, Erythema. Periwound temperature was noted as No Abnormality. The periwound has tenderness on palpation. Wound #2 status is Open. Original  cause of wound was Gradually Appeared. The wound is located on the Left,Dorsal Toe Second. The wound measures 0.3cm length x 0.4cm width x 0.1cm depth; 0.094cm^2 area and 0.009cm^3 volume. There is Fat Layer (Subcutaneous Tissue) Exposed exposed. There is no tunneling or undermining noted. There is a medium amount of serous drainage noted. The wound margin is flat and intact. There is small (1-33%) red granulation within the wound bed. There is a small (  1-33%) amount of necrotic tissue within the wound bed including Adherent Slough. The periwound skin appearance exhibited: Rubor. The periwound skin appearance did not exhibit: Callus, Crepitus, Excoriation, Induration, Rash, Scarring, Dry/Scaly, Maceration, Atrophie Blanche, Cyanosis, Ecchymosis, Hemosiderin Staining, Mottled, Pallor, Erythema. Periwound temperature was noted as No Abnormality. The periwound has tenderness on palpation. Wound #6 status is Open. Original cause of wound was Gradually Appeared. The wound is located on the Left Calcaneus. The wound measures 1cm length x 1.8cm width x 0.4cm depth; 1.414cm^2 area and 0.565cm^3 volume. There is Fat Layer (Subcutaneous Tissue) Exposed exposed. There is no tunneling or undermining noted. There is a medium amount of serous drainage noted. The wound margin is flat and intact. There is medium (34-66%) red granulation within the wound bed. There is a medium (34-66%) amount of necrotic tissue within the wound bed including Adherent Slough. The periwound skin appearance did not exhibit: Callus, Crepitus, Excoriation, Induration, Rash, Scarring, Dry/Scaly, Maceration, Atrophie Blanche, Cyanosis, Ecchymosis, Hemosiderin Staining, Mottled, Pallor, Rubor, Erythema. Periwound temperature was noted as No Abnormality. The periwound has tenderness on palpation. Wound #7 status is Open. Original cause of wound was Gradually Appeared. The wound is located on the Right Calcaneus. The wound measures 1cm length  x 0.8cm width x 0.1cm depth; 0.628cm^2 area and 0.063cm^3 volume. There is Fat Layer (Subcutaneous Tissue) Exposed exposed. There is no tunneling or undermining noted. There is a none present amount of Stgermaine, Amillia P. ([id number]) drainage noted. The wound margin is distinct with the outline attached to the wound base. There is small (1-33%) red granulation within the wound bed. There is a large (67-100%) amount of necrotic tissue within the wound bed including Eschar and Adherent Slough. The periwound skin appearance exhibited: Dry/Scaly. The periwound skin appearance did not exhibit: Callus, Crepitus, Excoriation, Induration, Rash, Scarring, Maceration, Atrophie Blanche, Cyanosis, Ecchymosis, Hemosiderin Staining, Mottled, Pallor, Rubor, Erythema. Periwound temperature was noted as No Abnormality. The periwound has tenderness on palpation. Wound #8 status is Open. Original cause of wound was Pressure Injury. The wound is located on the Midline Sacrum. The wound measures 3cm length x 4cm width x 0.1cm depth; 9.425cm^2 area and 0.942cm^3 volume. There is Fat Layer (Subcutaneous Tissue) Exposed exposed. There is no tunneling or undermining noted. There is a medium amount of serous drainage noted. The wound margin is flat and intact. There is small (1-33%) pink granulation within the wound bed. There is a large (67-100%) amount of necrotic tissue within the wound bed including Adherent Slough. The periwound skin appearance exhibited: Scarring, Erythema. The periwound skin appearance did not exhibit: Callus, Crepitus, Excoriation, Induration, Rash, Dry/Scaly, Maceration, Atrophie Blanche, Cyanosis, Ecchymosis, Hemosiderin Staining, Mottled, Pallor, Rubor. The surrounding wound skin color is noted with erythema which is circumferential. Periwound temperature was noted as No Abnormality. Assessment Active Problems ICD-10 Type 2 diabetes mellitus with foot ulcer Lymphedema, not elsewhere  classified Pressure ulcer of sacral region, stage 3 Non-pressure chronic ulcer of other part of left foot with fat layer exposed Pressure ulcer of left heel, stage 3 Pressure ulcer of right heel, stage 3 Other specified peripheral vascular diseases Procedures Wound #6 Pre-procedure diagnosis of Wound #6 is a Pressure Ulcer located on the Left Calcaneus . There was a Three Layer Compression Therapy Procedure with a pre-treatment ABI of 0.9 by Montey Hora, RN. Post procedure Diagnosis Wound #6: Same as Pre-Procedure Wound #7 Pre-procedure diagnosis of Wound #7 is a Pressure Ulcer located on the Right Calcaneus . There was a Three Layer Compression Therapy  Procedure with a pre-treatment ABI of 0.9 by Montey Hora, RN. Post procedure Diagnosis Wound #7: Same as Pre-Procedure Plan Sharon [name], Gracia P. ([id number]) Wound Cleansing: Wound #2 Left,Dorsal Toe Second: Cleanse wound with mild soap and water Wound #6 Left Calcaneus: Cleanse wound with mild soap and water Wound #7 Right Calcaneus: Cleanse wound with mild soap and water Skin Barriers/Peri-Wound Care: Wound #2 Left,Dorsal Toe Second: Moisturizing lotion Wound #6 Left Calcaneus: Moisturizing lotion Wound #7 Right Calcaneus: Moisturizing lotion Primary Wound Dressing: Wound #2 Left,Dorsal Toe Second: Silver Alginate Wound #6 Left Calcaneus: Silver Alginate Wound #7 Right Calcaneus: Silver Alginate Wound #8 Midline Sacrum: Silver Alginate Secondary Dressing: Wound #6 Left Calcaneus: ABD pad Wound #7 Right Calcaneus: ABD pad Wound #8 Midline Sacrum: Boardered Foam Dressing Dressing Change Frequency: Wound #2 Left,Dorsal Toe Second: Other: - twice weekly - Tuesdays and Fridays Wound #6 Left Calcaneus: Other: - twice weekly - Tuesdays and Fridays Wound #7 Right Calcaneus: Other: - twice weekly - Tuesdays and Fridays Wound #8 Midline Sacrum: Other: - twice weekly - Tuesdays and Fridays Follow-up  Appointments: Wound #2 Left,Dorsal Toe Second: Return Appointment in 2 weeks. - HHRN to visit patient on Tuesdays and Fridays when patient does not come into [facility] and Fridays when she does come into clinic Wound #6 Left Calcaneus: Return Appointment in 2 weeks. - HHRN to visit patient on Tuesdays and Fridays when patient does not come into [facility] and Fridays when she does come into clinic Wound #7 Right Calcaneus: Return Appointment in 2 weeks. - HHRN to visit patient on Tuesdays and Fridays when patient does not come into [facility] and Fridays when she does come into clinic Wound #8 Midline Sacrum: Return Appointment in 2 weeks. - HHRN to visit patient on Tuesdays and Fridays when patient does not come into [facility] and Fridays when she does come into clinic Edema Control: Wound #6 Left Calcaneus: 3 Layer Compression System - Bilateral - Please wrap from toes to 3cm below the knee Compression Pump: Use compression pump on left lower extremity for 30 minutes, twice daily. - USE PUMPS TWICE DAILY FOR 1 HOUR EACH TIME Compression Pump: Use compression pump on right lower extremity for 30 minutes, twice daily. - USE PUMPS TWICE DAILY FOR 1 HOUR EACH TIME [place]away, Reniah P. ([id number]) Wound #7 Right Calcaneus: 3 Layer Compression System - Bilateral - Please wrap from toes to 3cm below the knee Off-Loading: Wound #8 Midline Sacrum: Turn and reposition every 2 hours - DO NOT SIT FOR LONGER THAN 2 HOURS. Home Health: Wound #2 Left,Dorsal Toe Second: [name] Visits - [facility] Nurse may visit PRN to address patient s wound care needs. FACE TO FACE ENCOUNTER: MEDICARE and MEDICAID PATIENTS: I certify that this patient is under my care and that I had a face-to-face encounter that meets the physician face-to-face encounter requirements with this patient on this date. The encounter with the patient  was in whole or in part for the following MEDICAL CONDITION: (primary reason for [place]) MEDICAL NECESSITY: I certify, that based on my findings, NURSING services are a medically necessary home health service. HOME BOUND STATUS: I certify that my clinical findings support that this patient is homebound (i.e., Due to illness or injury, pt requires aid of supportive devices such as crutches, cane, wheelchairs, walkers, the use of special transportation or the assistance of another person to leave their place of residence. There is  a normal inability to leave the home and doing so requires considerable and taxing effort. Other absences are for medical reasons / religious services and are infrequent or of short duration when for other reasons). If current dressing causes regression in wound condition, may D/C ordered dressing product/s and apply Normal Saline Moist Dressing daily until next [place] / Other MD appointment. [place] of regression in wound condition at [phone]. Please direct any NON-WOUND related issues/requests for orders to patient's Primary Care Physician Wound #6 Left Calcaneus: [place] Visits - [facility] Nurse may visit PRN to address patient s wound care needs. FACE TO FACE ENCOUNTER: MEDICARE and MEDICAID PATIENTS: I certify that this patient is under my care and that I had a face-to-face encounter that meets the physician face-to-face encounter requirements with this patient on this date. The encounter with the patient was in whole or in part for the following MEDICAL CONDITION: (primary reason for [place]) MEDICAL NECESSITY: I certify, that based on my findings, NURSING services are a medically necessary home health service. HOME BOUND STATUS: I certify that my clinical findings support that this patient is homebound (i.e., Due to illness or injury, pt requires aid of supportive devices such as  crutches, cane, wheelchairs, walkers, the use of special transportation or the assistance of another person to leave their place of residence. There is a normal inability to leave the home and doing so requires considerable and taxing effort. Other absences are for medical reasons / religious services and are infrequent or of short duration when for other reasons). If current dressing causes regression in wound condition, may D/C ordered dressing product/s and apply Normal Saline Moist Dressing daily until next [place] / Other MD appointment. [place] of regression in wound condition at [phone]. Please direct any NON-WOUND related issues/requests for orders to patient's Primary Care Physician Wound #7 Right Calcaneus: [place] Visits - [facility] Nurse may visit PRN to address patient s wound care needs. FACE TO FACE ENCOUNTER: MEDICARE and MEDICAID PATIENTS: I certify that this patient is under my care and that I had a face-to-face encounter that meets the physician face-to-face encounter requirements with this patient on this date. The encounter with the patient was in whole or in part for the following MEDICAL CONDITION: (primary reason for [place]) MEDICAL NECESSITY: I certify, that based on my findings, NURSING services are a medically necessary home health service. HOME BOUND STATUS: I certify that my clinical findings support that this patient is homebound (i.e., Due to illness or injury, pt requires aid of supportive devices such as crutches, cane, wheelchairs, walkers, the use of special transportation or the assistance of another person to leave their place of residence. There is a normal inability to leave the home and doing so requires considerable and taxing effort. Other absences are for medical reasons / religious services and are infrequent or of short duration when for other reasons). If current dressing causes  regression in wound condition, may D/C ordered dressing product/s and apply Normal Saline Moist Dressing daily until next [place] / Other MD appointment. [place] of regression in wound condition at [phone]. Please direct any NON-WOUND related issues/requests for orders to patient's Primary Care Physician Wound #8 Midline Sacrum: [place] Visits - [facility] Nurse may visit PRN to address patient s wound care needs. FACE TO FACE ENCOUNTER: MEDICARE and MEDICAID PATIENTS: I  certify that this patient is under my care and that I had a face-to-face encounter that meets the physician face-to-face encounter requirements with this patient on this date. The encounter with the patient was in whole or in part for the following MEDICAL CONDITION: (primary reason for [place], Raliyah P. ([id number]) Healthcare) MEDICAL NECESSITY: I certify, that based on my findings, NURSING services are a medically necessary home health service. HOME BOUND STATUS: I certify that my clinical findings support that this patient is homebound (i.e., Due to illness or injury, pt requires aid of supportive devices such as crutches, cane, wheelchairs, walkers, the use of special transportation or the assistance of another person to leave their place of residence. There is a normal inability to leave the home and doing so requires considerable and taxing effort. Other absences are for medical reasons / religious services and are infrequent or of short duration when for other reasons). If current dressing causes regression in wound condition, may D/C ordered dressing product/s and apply Normal Saline Moist Dressing daily until next [place] / Other MD appointment. [place] of regression in wound condition at [phone]. Please direct any NON-WOUND related issues/requests for orders to patient's Primary Care Physician My suggestion at this  point is gonna be that we go ahead and initiate a continuation of the above wound care measures we will utilize silver alginate to the sacral region as well. Patient is in agreement with plan. We will subsequently see were things stand at follow-up. If anything changes worsens meantime patient or her son will contact the office let me know I do think she's to be using the compression pumps as well daily as previously recommended I'm not sure why the facility is not doing that. Please see above for specific wound care orders. We will see patient for re-evaluation in 2 week(s) here in the clinic. If anything worsens or changes patient will contact our office for additional recommendations. I discussed with both the patient and her son but it is gonna be of utmost importance that she offload frequently and not sit in her wheelchair for an extended period of time. I think this is in fact the most important thing in order to see things improve appropriately. She states she understands. Electronic Signature(s) Signed: 05/12/2018 7:59:38 AM By: Worthy Keeler PA-C Entered By: Worthy Keeler on 05/11/2018 13:20:30 [place], Sharon [name] ([id number]) -------------------------------------------------------------------------------- ROS/PFSH Details Patient Name: Groseclose, Breezy P. Date of Service: 05/11/2018 11:00 AM Medical Record Number: [id number] Patient Account Number: [id number] Date of Birth/Sex: 07/22/1933 (85 y.o. F) Treating RN: Montey Hora Primary Care Provider: Emily Filbert Other Clinician: Referring Provider: Emily Filbert Treating Provider/Extender: Melburn Hake, Mylasia Vorhees Weeks in Treatment: 7 Information Obtained From Patient Constitutional Symptoms (General Health) Complaints and Symptoms: Negative for: Fatigue; Fever; Chills; Marked Weight Change Respiratory Complaints and Symptoms: Negative for: Chronic or frequent coughs; Shortness of Breath Medical History: Negative for: Aspiration;  Asthma; Chronic Obstructive Pulmonary Disease (COPD); Pneumothorax; Sleep Apnea; Tuberculosis Cardiovascular Complaints and Symptoms: Positive for: LE edema Negative for: Chest pain Medical History: Positive for: Arrhythmia - A. fib; Congestive Heart Failure; Hypertension Negative for: Angina; Hypotension; Myocardial Infarction; Peripheral Arterial Disease; Peripheral Venous Disease; Phlebitis; Vasculitis Psychiatric Complaints and Symptoms: Negative for: Anxiety; Claustrophobia Medical History: Positive for: Anorexia/bulimia; Confinement Anxiety Eyes Medical History: Positive for: Cataracts Negative for: Glaucoma; Optic Neuritis Ear/Nose/Mouth/Throat Medical History: Negative for: Chronic sinus problems/congestion; Middle ear problems Hematologic/Lymphatic Medical History: Positive for: Anemia Bostrom, Ahsley P. ([id number]) Negative  for: Hemophilia; Human Immunodeficiency Virus; Lymphedema; Sickle Cell Disease Gastrointestinal Medical History: Negative for: Cirrhosis ; Colitis; Crohnos; Hepatitis A; Hepatitis B; Hepatitis C Endocrine Medical History: Positive for: Type II Diabetes - oral medication Time with diabetes: 15 years Treated with: Oral agents Blood sugar tested every day: No Genitourinary Medical History: Negative for: End Stage Renal Disease Immunological Medical History: Negative for: Lupus Erythematosus; Raynaudos; Scleroderma Integumentary (Skin) Medical History: Negative for: History of Burn; History of pressure wounds Musculoskeletal Medical History: Positive for: Rheumatoid Arthritis; Osteoarthritis Negative for: Gout; Osteomyelitis Neurologic Medical History: Positive for: Neuropathy Negative for: Dementia; Quadriplegia; Paraplegia; Seizure Disorder Oncologic Medical History: Negative for: Received Chemotherapy; Received Radiation HBO Extended History Items Eyes: Cataracts Immunizations Pneumococcal Vaccine: Received Pneumococcal  Vaccination: Yes Implantable Devices Yes H[place]on, Obelia P. ([id number]) Hospitalization / Surgery History Type of Hospitalization/Surgery Fluid retention Family and Social History Cancer: No; Diabetes: No; Hypertension: Yes - Child,Mother; Kidney Disease: No; Lung Disease: No; Seizures: No; Stroke: Yes - Father; Thyroid Problems: No; Tuberculosis: No; Former smoker - 30 years ago; Marital Status - Widowed; Alcohol Use: Never; Drug Use: No History; Caffeine Use: Rarely; Financial Concerns: No; Food, Clothing or Shelter Needs: No; Support System Lacking: No; Transportation Concerns: No Physician Affirmation I have reviewed and agree with the above information. Electronic Signature(s) Signed: 05/11/2018 3:19:36 PM By: Montey Hora Signed: 05/12/2018 7:59:38 AM By: Worthy Keeler PA-C Entered By: Worthy Keeler on 05/11/2018 13:18:54 [place], Janashia P. ([id number]) -------------------------------------------------------------------------------- SuperBill Details Patient Name: Bleicher, Itzayanna P. Date of Service: 05/11/2018 Medical Record Number: [id number] Patient Account Number: [id number] Date of Birth/Sex: 1933-10-31 (85 y.o. F) Treating RN: Montey Hora Primary Care Provider: Emily Filbert Other Clinician: Referring Provider: Emily Filbert Treating Provider/Extender: Melburn Hake, Carolanne Mercier Weeks in Treatment: 7 Diagnosis Coding ICD-10 Codes Code Description E11.621 Type 2 diabetes mellitus with foot ulcer I89.0 Lymphedema, not elsewhere classified L89.153 Pressure ulcer of sacral region, stage 3 L97.522 Non-pressure chronic ulcer of other part of left foot with fat layer exposed L89.623 Pressure ulcer of left heel, stage 3 L89.613 Pressure ulcer of right heel, stage 3 I73.89 Other specified peripheral vascular diseases Facility Procedures CPT4: Description Modifier Quantity Code [id number] BILATERAL: Application of multi-layer venous compression system; leg (below 1 knee),  including ankle and foot. Physician Procedures CPT4 Code: [id number] Description: 46503 - WC PHYS LEVEL 4 - EST PT ICD-10 Diagnosis Description E11.621 Type 2 diabetes mellitus with foot ulcer I89.0 Lymphedema, not elsewhere classified L89.153 Pressure ulcer of sacral region, stage 3 L97.522 Non-pressure chronic ulcer of  other part of left foot with fat Modifier: layer exposed Quantity: 1 Electronic Signature(s) Signed: 05/12/2018 7:59:38 AM By: Worthy Keeler PA-C Entered By: Worthy Keeler on 05/11/2018 13:20:44

## 2018-05-13 DIAGNOSIS — F419 Anxiety disorder, unspecified: Secondary | ICD-10-CM | POA: Diagnosis not present

## 2018-05-14 DIAGNOSIS — L89153 Pressure ulcer of sacral region, stage 3: Secondary | ICD-10-CM | POA: Diagnosis not present

## 2018-05-14 DIAGNOSIS — N170 Acute kidney failure with tubular necrosis: Secondary | ICD-10-CM | POA: Diagnosis not present

## 2018-05-14 DIAGNOSIS — L89623 Pressure ulcer of left heel, stage 3: Secondary | ICD-10-CM | POA: Diagnosis not present

## 2018-05-14 DIAGNOSIS — Z79891 Long term (current) use of opiate analgesic: Secondary | ICD-10-CM | POA: Diagnosis not present

## 2018-05-14 DIAGNOSIS — H3530 Unspecified macular degeneration: Secondary | ICD-10-CM | POA: Diagnosis not present

## 2018-05-14 DIAGNOSIS — I480 Paroxysmal atrial fibrillation: Secondary | ICD-10-CM | POA: Diagnosis not present

## 2018-05-14 DIAGNOSIS — Z8674 Personal history of sudden cardiac arrest: Secondary | ICD-10-CM | POA: Diagnosis not present

## 2018-05-14 DIAGNOSIS — R635 Abnormal weight gain: Secondary | ICD-10-CM | POA: Diagnosis not present

## 2018-05-14 DIAGNOSIS — S32010S Wedge compression fracture of first lumbar vertebra, sequela: Secondary | ICD-10-CM | POA: Diagnosis not present

## 2018-05-14 DIAGNOSIS — I5033 Acute on chronic diastolic (congestive) heart failure: Secondary | ICD-10-CM | POA: Diagnosis not present

## 2018-05-14 DIAGNOSIS — I130 Hypertensive heart and chronic kidney disease with heart failure and stage 1 through stage 4 chronic kidney disease, or unspecified chronic kidney disease: Secondary | ICD-10-CM | POA: Diagnosis not present

## 2018-05-14 DIAGNOSIS — E1151 Type 2 diabetes mellitus with diabetic peripheral angiopathy without gangrene: Secondary | ICD-10-CM | POA: Diagnosis not present

## 2018-05-14 DIAGNOSIS — Z4800 Encounter for change or removal of nonsurgical wound dressing: Secondary | ICD-10-CM | POA: Diagnosis not present

## 2018-05-14 DIAGNOSIS — L89322 Pressure ulcer of left buttock, stage 2: Secondary | ICD-10-CM | POA: Diagnosis not present

## 2018-05-14 DIAGNOSIS — M810 Age-related osteoporosis without current pathological fracture: Secondary | ICD-10-CM | POA: Diagnosis not present

## 2018-05-14 DIAGNOSIS — Z7984 Long term (current) use of oral hypoglycemic drugs: Secondary | ICD-10-CM | POA: Diagnosis not present

## 2018-05-14 DIAGNOSIS — R740 Nonspecific elevation of levels of transaminase and lactic acid dehydrogenase [LDH]: Secondary | ICD-10-CM | POA: Diagnosis not present

## 2018-05-14 DIAGNOSIS — I70203 Unspecified atherosclerosis of native arteries of extremities, bilateral legs: Secondary | ICD-10-CM | POA: Diagnosis not present

## 2018-05-14 DIAGNOSIS — E1142 Type 2 diabetes mellitus with diabetic polyneuropathy: Secondary | ICD-10-CM | POA: Diagnosis not present

## 2018-05-14 DIAGNOSIS — D631 Anemia in chronic kidney disease: Secondary | ICD-10-CM | POA: Diagnosis not present

## 2018-05-14 DIAGNOSIS — Z9181 History of falling: Secondary | ICD-10-CM | POA: Diagnosis not present

## 2018-05-14 DIAGNOSIS — I2510 Atherosclerotic heart disease of native coronary artery without angina pectoris: Secondary | ICD-10-CM | POA: Diagnosis not present

## 2018-05-14 DIAGNOSIS — N183 Chronic kidney disease, stage 3 (moderate): Secondary | ICD-10-CM | POA: Diagnosis not present

## 2018-05-14 DIAGNOSIS — L89610 Pressure ulcer of right heel, unstageable: Secondary | ICD-10-CM | POA: Diagnosis not present

## 2018-05-18 DIAGNOSIS — K5909 Other constipation: Secondary | ICD-10-CM | POA: Diagnosis not present

## 2018-05-18 DIAGNOSIS — E1129 Type 2 diabetes mellitus with other diabetic kidney complication: Secondary | ICD-10-CM | POA: Diagnosis not present

## 2018-05-18 DIAGNOSIS — R634 Abnormal weight loss: Secondary | ICD-10-CM | POA: Diagnosis not present

## 2018-05-18 DIAGNOSIS — S32010S Wedge compression fracture of first lumbar vertebra, sequela: Secondary | ICD-10-CM | POA: Diagnosis not present

## 2018-05-19 DIAGNOSIS — F320 Major depressive disorder, single episode, mild: Secondary | ICD-10-CM | POA: Diagnosis not present

## 2018-05-20 DIAGNOSIS — E1129 Type 2 diabetes mellitus with other diabetic kidney complication: Secondary | ICD-10-CM | POA: Diagnosis not present

## 2018-05-20 DIAGNOSIS — S32010S Wedge compression fracture of first lumbar vertebra, sequela: Secondary | ICD-10-CM | POA: Diagnosis not present

## 2018-05-20 DIAGNOSIS — R634 Abnormal weight loss: Secondary | ICD-10-CM | POA: Diagnosis not present

## 2018-05-21 DIAGNOSIS — E1122 Type 2 diabetes mellitus with diabetic chronic kidney disease: Secondary | ICD-10-CM | POA: Diagnosis not present

## 2018-05-21 DIAGNOSIS — S32010A Wedge compression fracture of first lumbar vertebra, initial encounter for closed fracture: Secondary | ICD-10-CM | POA: Diagnosis not present

## 2018-05-21 DIAGNOSIS — R634 Abnormal weight loss: Secondary | ICD-10-CM | POA: Diagnosis not present

## 2018-05-21 DIAGNOSIS — K5909 Other constipation: Secondary | ICD-10-CM | POA: Diagnosis not present

## 2018-05-25 ENCOUNTER — Ambulatory Visit: Payer: PPO | Admitting: Physician Assistant

## 2018-05-25 DIAGNOSIS — R634 Abnormal weight loss: Secondary | ICD-10-CM | POA: Diagnosis not present

## 2018-05-25 DIAGNOSIS — E1129 Type 2 diabetes mellitus with other diabetic kidney complication: Secondary | ICD-10-CM | POA: Diagnosis not present

## 2018-05-25 DIAGNOSIS — R451 Restlessness and agitation: Secondary | ICD-10-CM | POA: Diagnosis not present

## 2018-05-25 DIAGNOSIS — R52 Pain, unspecified: Secondary | ICD-10-CM | POA: Diagnosis not present

## 2018-05-26 DIAGNOSIS — K5900 Constipation, unspecified: Secondary | ICD-10-CM | POA: Diagnosis not present

## 2018-05-26 DIAGNOSIS — F320 Major depressive disorder, single episode, mild: Secondary | ICD-10-CM | POA: Diagnosis not present

## 2018-05-26 DIAGNOSIS — F419 Anxiety disorder, unspecified: Secondary | ICD-10-CM | POA: Diagnosis not present

## 2018-05-27 ENCOUNTER — Other Ambulatory Visit: Payer: Self-pay | Admitting: Internal Medicine

## 2018-05-27 ENCOUNTER — Ambulatory Visit
Admission: RE | Admit: 2018-05-27 | Discharge: 2018-05-27 | Disposition: A | Discharge status: Home / Self Care | Payer: Self-pay | Source: Ambulatory Visit | Attending: Internal Medicine | Admitting: Internal Medicine

## 2018-05-27 DIAGNOSIS — R001 Bradycardia, unspecified: Secondary | ICD-10-CM | POA: Diagnosis not present

## 2018-05-27 DIAGNOSIS — M8588 Other specified disorders of bone density and structure, other site: Secondary | ICD-10-CM

## 2018-05-27 DIAGNOSIS — K5909 Other constipation: Secondary | ICD-10-CM | POA: Diagnosis not present

## 2018-05-27 DIAGNOSIS — F419 Anxiety disorder, unspecified: Secondary | ICD-10-CM | POA: Diagnosis not present

## 2018-05-27 DIAGNOSIS — F320 Major depressive disorder, single episode, mild: Secondary | ICD-10-CM | POA: Diagnosis not present

## 2018-05-27 DIAGNOSIS — E559 Vitamin D deficiency, unspecified: Secondary | ICD-10-CM | POA: Diagnosis not present

## 2018-05-28 ENCOUNTER — Encounter: Payer: Self-pay | Admitting: Emergency Medicine

## 2018-05-28 ENCOUNTER — Inpatient Hospital Stay
Admission: EM | Admit: 2018-05-28 | Discharge: 2018-06-04 | DRG: 982 | Disposition: A | Discharge status: Skilled Nursing Facility | Payer: PPO | Source: Skilled Nursing Facility | Attending: Internal Medicine | Admitting: Internal Medicine

## 2018-05-28 ENCOUNTER — Other Ambulatory Visit: Payer: Self-pay

## 2018-05-28 ENCOUNTER — Emergency Department: Payer: PPO

## 2018-05-28 ENCOUNTER — Encounter: Payer: PPO | Attending: Physician Assistant | Admitting: Physician Assistant

## 2018-05-28 DIAGNOSIS — Z79890 Hormone replacement therapy (postmenopausal): Secondary | ICD-10-CM

## 2018-05-28 DIAGNOSIS — Z803 Family history of malignant neoplasm of breast: Secondary | ICD-10-CM

## 2018-05-28 DIAGNOSIS — R8271 Bacteriuria: Secondary | ICD-10-CM | POA: Diagnosis not present

## 2018-05-28 DIAGNOSIS — R404 Transient alteration of awareness: Secondary | ICD-10-CM | POA: Diagnosis not present

## 2018-05-28 DIAGNOSIS — N183 Chronic kidney disease, stage 3 (moderate): Secondary | ICD-10-CM | POA: Diagnosis present

## 2018-05-28 DIAGNOSIS — Z9861 Coronary angioplasty status: Secondary | ICD-10-CM

## 2018-05-28 DIAGNOSIS — G894 Chronic pain syndrome: Secondary | ICD-10-CM | POA: Diagnosis present

## 2018-05-28 DIAGNOSIS — I5032 Chronic diastolic (congestive) heart failure: Secondary | ICD-10-CM | POA: Diagnosis not present

## 2018-05-28 DIAGNOSIS — Z7189 Other specified counseling: Secondary | ICD-10-CM | POA: Diagnosis not present

## 2018-05-28 DIAGNOSIS — Z8249 Family history of ischemic heart disease and other diseases of the circulatory system: Secondary | ICD-10-CM

## 2018-05-28 DIAGNOSIS — T402X5A Adverse effect of other opioids, initial encounter: Secondary | ICD-10-CM | POA: Diagnosis not present

## 2018-05-28 DIAGNOSIS — E46 Unspecified protein-calorie malnutrition: Secondary | ICD-10-CM | POA: Diagnosis not present

## 2018-05-28 DIAGNOSIS — Z794 Long term (current) use of insulin: Secondary | ICD-10-CM

## 2018-05-28 DIAGNOSIS — M4856XA Collapsed vertebra, not elsewhere classified, lumbar region, initial encounter for fracture: Secondary | ICD-10-CM | POA: Diagnosis not present

## 2018-05-28 DIAGNOSIS — Z515 Encounter for palliative care: Secondary | ICD-10-CM | POA: Diagnosis not present

## 2018-05-28 DIAGNOSIS — D649 Anemia, unspecified: Secondary | ICD-10-CM | POA: Diagnosis not present

## 2018-05-28 DIAGNOSIS — E785 Hyperlipidemia, unspecified: Secondary | ICD-10-CM | POA: Diagnosis not present

## 2018-05-28 DIAGNOSIS — R634 Abnormal weight loss: Secondary | ICD-10-CM | POA: Diagnosis not present

## 2018-05-28 DIAGNOSIS — I4891 Unspecified atrial fibrillation: Secondary | ICD-10-CM | POA: Diagnosis not present

## 2018-05-28 DIAGNOSIS — S32010A Wedge compression fracture of first lumbar vertebra, initial encounter for closed fracture: Secondary | ICD-10-CM | POA: Diagnosis not present

## 2018-05-28 DIAGNOSIS — X58XXXA Exposure to other specified factors, initial encounter: Secondary | ICD-10-CM | POA: Diagnosis present

## 2018-05-28 DIAGNOSIS — T43215A Adverse effect of selective serotonin and norepinephrine reuptake inhibitors, initial encounter: Secondary | ICD-10-CM | POA: Diagnosis present

## 2018-05-28 DIAGNOSIS — L98429 Non-pressure chronic ulcer of back with unspecified severity: Secondary | ICD-10-CM | POA: Diagnosis not present

## 2018-05-28 DIAGNOSIS — I2510 Atherosclerotic heart disease of native coronary artery without angina pectoris: Secondary | ICD-10-CM | POA: Diagnosis not present

## 2018-05-28 DIAGNOSIS — Z1159 Encounter for screening for other viral diseases: Secondary | ICD-10-CM

## 2018-05-28 DIAGNOSIS — S81802A Unspecified open wound, left lower leg, initial encounter: Secondary | ICD-10-CM | POA: Diagnosis not present

## 2018-05-28 DIAGNOSIS — G8929 Other chronic pain: Secondary | ICD-10-CM | POA: Diagnosis not present

## 2018-05-28 DIAGNOSIS — E1140 Type 2 diabetes mellitus with diabetic neuropathy, unspecified: Secondary | ICD-10-CM | POA: Diagnosis present

## 2018-05-28 DIAGNOSIS — R451 Restlessness and agitation: Secondary | ICD-10-CM | POA: Diagnosis not present

## 2018-05-28 DIAGNOSIS — Z9049 Acquired absence of other specified parts of digestive tract: Secondary | ICD-10-CM | POA: Diagnosis not present

## 2018-05-28 DIAGNOSIS — F0390 Unspecified dementia without behavioral disturbance: Secondary | ICD-10-CM | POA: Diagnosis present

## 2018-05-28 DIAGNOSIS — Y92129 Unspecified place in nursing home as the place of occurrence of the external cause: Secondary | ICD-10-CM

## 2018-05-28 DIAGNOSIS — Z8631 Personal history of diabetic foot ulcer: Secondary | ICD-10-CM | POA: Diagnosis not present

## 2018-05-28 DIAGNOSIS — Z8674 Personal history of sudden cardiac arrest: Secondary | ICD-10-CM

## 2018-05-28 DIAGNOSIS — L89152 Pressure ulcer of sacral region, stage 2: Secondary | ICD-10-CM | POA: Diagnosis not present

## 2018-05-28 DIAGNOSIS — E440 Moderate protein-calorie malnutrition: Secondary | ICD-10-CM | POA: Diagnosis present

## 2018-05-28 DIAGNOSIS — G629 Polyneuropathy, unspecified: Secondary | ICD-10-CM | POA: Diagnosis present

## 2018-05-28 DIAGNOSIS — I739 Peripheral vascular disease, unspecified: Secondary | ICD-10-CM | POA: Diagnosis not present

## 2018-05-28 DIAGNOSIS — H269 Unspecified cataract: Secondary | ICD-10-CM | POA: Diagnosis not present

## 2018-05-28 DIAGNOSIS — Z8673 Personal history of transient ischemic attack (TIA), and cerebral infarction without residual deficits: Secondary | ICD-10-CM | POA: Diagnosis not present

## 2018-05-28 DIAGNOSIS — M6281 Muscle weakness (generalized): Secondary | ICD-10-CM | POA: Diagnosis not present

## 2018-05-28 DIAGNOSIS — Z87891 Personal history of nicotine dependence: Secondary | ICD-10-CM | POA: Diagnosis not present

## 2018-05-28 DIAGNOSIS — N179 Acute kidney failure, unspecified: Secondary | ICD-10-CM | POA: Diagnosis present

## 2018-05-28 DIAGNOSIS — Z7401 Bed confinement status: Secondary | ICD-10-CM | POA: Diagnosis not present

## 2018-05-28 DIAGNOSIS — E039 Hypothyroidism, unspecified: Secondary | ICD-10-CM | POA: Diagnosis not present

## 2018-05-28 DIAGNOSIS — Z20828 Contact with and (suspected) exposure to other viral communicable diseases: Secondary | ICD-10-CM | POA: Diagnosis not present

## 2018-05-28 DIAGNOSIS — I959 Hypotension, unspecified: Secondary | ICD-10-CM | POA: Diagnosis not present

## 2018-05-28 DIAGNOSIS — I130 Hypertensive heart and chronic kidney disease with heart failure and stage 1 through stage 4 chronic kidney disease, or unspecified chronic kidney disease: Secondary | ICD-10-CM | POA: Diagnosis present

## 2018-05-28 DIAGNOSIS — E119 Type 2 diabetes mellitus without complications: Secondary | ICD-10-CM | POA: Diagnosis not present

## 2018-05-28 DIAGNOSIS — Z6825 Body mass index (BMI) 25.0-25.9, adult: Secondary | ICD-10-CM

## 2018-05-28 DIAGNOSIS — Z79891 Long term (current) use of opiate analgesic: Secondary | ICD-10-CM

## 2018-05-28 DIAGNOSIS — R945 Abnormal results of liver function studies: Secondary | ICD-10-CM | POA: Diagnosis not present

## 2018-05-28 DIAGNOSIS — M549 Dorsalgia, unspecified: Secondary | ICD-10-CM | POA: Diagnosis not present

## 2018-05-28 DIAGNOSIS — E538 Deficiency of other specified B group vitamins: Secondary | ICD-10-CM | POA: Diagnosis present

## 2018-05-28 DIAGNOSIS — Z96643 Presence of artificial hip joint, bilateral: Secondary | ICD-10-CM | POA: Diagnosis present

## 2018-05-28 DIAGNOSIS — S81801A Unspecified open wound, right lower leg, initial encounter: Secondary | ICD-10-CM | POA: Diagnosis present

## 2018-05-28 DIAGNOSIS — R079 Chest pain, unspecified: Secondary | ICD-10-CM | POA: Diagnosis not present

## 2018-05-28 DIAGNOSIS — I10 Essential (primary) hypertension: Secondary | ICD-10-CM | POA: Diagnosis not present

## 2018-05-28 DIAGNOSIS — D509 Iron deficiency anemia, unspecified: Secondary | ICD-10-CM | POA: Diagnosis not present

## 2018-05-28 DIAGNOSIS — Z981 Arthrodesis status: Secondary | ICD-10-CM | POA: Diagnosis not present

## 2018-05-28 DIAGNOSIS — N390 Urinary tract infection, site not specified: Secondary | ICD-10-CM | POA: Diagnosis not present

## 2018-05-28 DIAGNOSIS — E1122 Type 2 diabetes mellitus with diabetic chronic kidney disease: Secondary | ICD-10-CM | POA: Diagnosis not present

## 2018-05-28 DIAGNOSIS — Z419 Encounter for procedure for purposes other than remedying health state, unspecified: Secondary | ICD-10-CM

## 2018-05-28 DIAGNOSIS — Z89411 Acquired absence of right great toe: Secondary | ICD-10-CM

## 2018-05-28 DIAGNOSIS — R52 Pain, unspecified: Secondary | ICD-10-CM | POA: Diagnosis not present

## 2018-05-28 DIAGNOSIS — Z9842 Cataract extraction status, left eye: Secondary | ICD-10-CM

## 2018-05-28 DIAGNOSIS — Z823 Family history of stroke: Secondary | ICD-10-CM

## 2018-05-28 DIAGNOSIS — R41841 Cognitive communication deficit: Secondary | ICD-10-CM | POA: Diagnosis not present

## 2018-05-28 DIAGNOSIS — M2550 Pain in unspecified joint: Secondary | ICD-10-CM | POA: Diagnosis not present

## 2018-05-28 DIAGNOSIS — H3530 Unspecified macular degeneration: Secondary | ICD-10-CM | POA: Diagnosis present

## 2018-05-28 DIAGNOSIS — M4856XD Collapsed vertebra, not elsewhere classified, lumbar region, subsequent encounter for fracture with routine healing: Secondary | ICD-10-CM | POA: Diagnosis not present

## 2018-05-28 DIAGNOSIS — R627 Adult failure to thrive: Secondary | ICD-10-CM | POA: Diagnosis present

## 2018-05-28 DIAGNOSIS — N189 Chronic kidney disease, unspecified: Secondary | ICD-10-CM | POA: Diagnosis not present

## 2018-05-28 DIAGNOSIS — R7989 Other specified abnormal findings of blood chemistry: Secondary | ICD-10-CM | POA: Diagnosis not present

## 2018-05-28 DIAGNOSIS — Z888 Allergy status to other drugs, medicaments and biological substances status: Secondary | ICD-10-CM

## 2018-05-28 DIAGNOSIS — Z961 Presence of intraocular lens: Secondary | ICD-10-CM | POA: Diagnosis present

## 2018-05-28 DIAGNOSIS — Z4789 Encounter for other orthopedic aftercare: Secondary | ICD-10-CM | POA: Diagnosis not present

## 2018-05-28 DIAGNOSIS — R001 Bradycardia, unspecified: Secondary | ICD-10-CM | POA: Diagnosis not present

## 2018-05-28 DIAGNOSIS — S32010D Wedge compression fracture of first lumbar vertebra, subsequent encounter for fracture with routine healing: Secondary | ICD-10-CM | POA: Diagnosis not present

## 2018-05-28 DIAGNOSIS — E038 Other specified hypothyroidism: Secondary | ICD-10-CM | POA: Diagnosis not present

## 2018-05-28 DIAGNOSIS — L97829 Non-pressure chronic ulcer of other part of left lower leg with unspecified severity: Secondary | ICD-10-CM | POA: Diagnosis not present

## 2018-05-28 DIAGNOSIS — L97819 Non-pressure chronic ulcer of other part of right lower leg with unspecified severity: Secondary | ICD-10-CM | POA: Diagnosis not present

## 2018-05-28 DIAGNOSIS — M47816 Spondylosis without myelopathy or radiculopathy, lumbar region: Secondary | ICD-10-CM | POA: Diagnosis not present

## 2018-05-28 DIAGNOSIS — F419 Anxiety disorder, unspecified: Secondary | ICD-10-CM | POA: Diagnosis present

## 2018-05-28 DIAGNOSIS — I509 Heart failure, unspecified: Secondary | ICD-10-CM | POA: Diagnosis not present

## 2018-05-28 DIAGNOSIS — Z801 Family history of malignant neoplasm of trachea, bronchus and lung: Secondary | ICD-10-CM

## 2018-05-28 DIAGNOSIS — Z7984 Long term (current) use of oral hypoglycemic drugs: Secondary | ICD-10-CM

## 2018-05-28 DIAGNOSIS — Z79899 Other long term (current) drug therapy: Secondary | ICD-10-CM

## 2018-05-28 LAB — CBC
HCT: 45.9 % (ref 36.0–46.0)
Hemoglobin: 14 g/dL (ref 12.0–15.0)
MCH: 27.3 pg (ref 26.0–34.0)
MCHC: 30.5 g/dL (ref 30.0–36.0)
MCV: 89.5 fL (ref 80.0–100.0)
Platelets: 303 10*3/uL (ref 150–400)
RBC: 5.13 MIL/uL — ABNORMAL HIGH (ref 3.87–5.11)
RDW: 15.2 % (ref 11.5–15.5)
WBC: 9.3 10*3/uL (ref 4.0–10.5)
nRBC: 0 % (ref 0.0–0.2)

## 2018-05-28 LAB — TROPONIN I: Troponin I: <0.03 ng/mL (ref <0.03)

## 2018-05-28 LAB — COMPREHENSIVE METABOLIC PANEL
ALT: 55 U/L — ABNORMAL HIGH (ref 0–44)
AST: 170 U/L — ABNORMAL HIGH (ref 15–41)
Albumin: 2.9 g/dL — ABNORMAL LOW (ref 3.5–5.0)
Alkaline Phosphatase: 357 U/L — ABNORMAL HIGH (ref 38–126)
Anion gap: 12 (ref 5–15)
BUN: 25 mg/dL — ABNORMAL HIGH (ref 8–23)
CO2: 18 mmol/L — ABNORMAL LOW (ref 22–32)
Calcium: 8.3 mg/dL — ABNORMAL LOW (ref 8.9–10.3)
Chloride: 104 mmol/L (ref 98–111)
Creatinine, Ser: 1.71 mg/dL — ABNORMAL HIGH (ref 0.44–1.00)
GFR calc Af Amer: 32 mL/min — ABNORMAL LOW (ref >60)
GFR calc non Af Amer: 27 mL/min — ABNORMAL LOW (ref >60)
Glucose, Bld: 175 mg/dL — ABNORMAL HIGH (ref 70–99)
Potassium: 5.2 mmol/L — ABNORMAL HIGH (ref 3.5–5.1)
Sodium: 134 mmol/L — ABNORMAL LOW (ref 135–145)
Total Bilirubin: 0.7 mg/dL (ref 0.3–1.2)
Total Protein: 6.3 g/dL — ABNORMAL LOW (ref 6.5–8.1)

## 2018-05-28 LAB — URINALYSIS, COMPLETE (UACMP) WITH MICROSCOPIC
Bilirubin Urine: NEGATIVE
Glucose, UA: NEGATIVE mg/dL
Ketones, ur: NEGATIVE mg/dL
Nitrite: NEGATIVE
Protein, ur: 100 mg/dL — AB
Specific Gravity, Urine: 1.018 (ref 1.005–1.030)
WBC, UA: >50 WBC/hpf — ABNORMAL HIGH (ref 0–5)
pH: 5 (ref 5.0–8.0)

## 2018-05-28 LAB — GLUCOSE, CAPILLARY
Glucose-Capillary: 150 mg/dL — ABNORMAL HIGH (ref 70–99)
Glucose-Capillary: 156 mg/dL — ABNORMAL HIGH (ref 70–99)

## 2018-05-28 LAB — SARS CORONAVIRUS 2 BY RT PCR (HOSPITAL ORDER, PERFORMED IN ~~LOC~~ HOSPITAL LAB): SARS Coronavirus 2: NEGATIVE

## 2018-05-28 MED ORDER — INSULIN GLARGINE 100 UNIT/ML ~~LOC~~ SOLN
15.0000 [IU] | Freq: Every day | SUBCUTANEOUS | Status: DC
  Administered 2018-05-28 – 2018-06-02 (×3): 15 [IU] via SUBCUTANEOUS
  Filled 2018-05-28 (×8): qty 0.15

## 2018-05-28 MED ORDER — LINACLOTIDE 145 MCG PO CAPS
145.0000 ug | ORAL_CAPSULE | Freq: Every day | ORAL | Status: DC
  Administered 2018-05-29 – 2018-06-01 (×4): 145 ug via ORAL
  Filled 2018-05-28 (×8): qty 1

## 2018-05-28 MED ORDER — ALBUTEROL SULFATE (2.5 MG/3ML) 0.083% IN NEBU: 2.5000 mg | INHALATION_SOLUTION | Freq: Four times a day (QID) | RESPIRATORY_TRACT | Status: DC | PRN

## 2018-05-28 MED ORDER — ALBUTEROL SULFATE (2.5 MG/3ML) 0.083% IN NEBU
2.5000 mg | INHALATION_SOLUTION | Freq: Four times a day (QID) | RESPIRATORY_TRACT | Status: DC
  Filled 2018-05-28: qty 3

## 2018-05-28 MED ORDER — SODIUM CHLORIDE 0.9 % IV SOLN
1.0000 g | INTRAVENOUS | Status: DC
  Administered 2018-05-29 – 2018-05-30 (×2): 1 g via INTRAVENOUS
  Filled 2018-05-28: qty 1
  Filled 2018-05-28: qty 10
  Filled 2018-05-28: qty 1

## 2018-05-28 MED ORDER — ONDANSETRON HCL 4 MG/2ML IJ SOLN: 4.0000 mg | Freq: Four times a day (QID) | INTRAMUSCULAR | Status: DC | PRN

## 2018-05-28 MED ORDER — HYDROCODONE-ACETAMINOPHEN 5-325 MG PO TABS: 1.0000 | ORAL_TABLET | Freq: Three times a day (TID) | ORAL | Status: DC

## 2018-05-28 MED ORDER — SODIUM CHLORIDE 0.9 % IV BOLUS
1000.0000 mL | Freq: Once | INTRAVENOUS | Status: AC
  Administered 2018-05-28: 1000 mL via INTRAVENOUS

## 2018-05-28 MED ORDER — GLUCERNA SHAKE PO LIQD: 237.0000 mL | Freq: Two times a day (BID) | ORAL | Status: DC

## 2018-05-28 MED ORDER — SODIUM CHLORIDE 0.9 % IV SOLN
INTRAVENOUS | Status: DC
  Administered 2018-05-28 – 2018-05-29 (×2): via INTRAVENOUS

## 2018-05-28 MED ORDER — VITAMIN C 500 MG PO TABS
250.0000 mg | ORAL_TABLET | Freq: Two times a day (BID) | ORAL | Status: DC
  Administered 2018-05-28 – 2018-06-04 (×11): 250 mg via ORAL
  Filled 2018-05-28 (×8): qty 1

## 2018-05-28 MED ORDER — PRO-STAT SUGAR FREE PO LIQD
30.0000 mL | Freq: Two times a day (BID) | ORAL | Status: DC
  Administered 2018-05-29 (×2): 30 mL via ORAL

## 2018-05-28 MED ORDER — SODIUM CHLORIDE 0.9 % IV SOLN
1.0000 g | Freq: Once | INTRAVENOUS | Status: AC
  Administered 2018-05-28: 1 g via INTRAVENOUS
  Filled 2018-05-28: qty 10

## 2018-05-28 MED ORDER — ADULT MULTIVITAMIN W/MINERALS CH
1.0000 | ORAL_TABLET | Freq: Every day | ORAL | Status: DC
  Administered 2018-05-29 – 2018-06-01 (×4): 1 via ORAL
  Filled 2018-05-28 (×6): qty 1

## 2018-05-28 MED ORDER — OXYCODONE-ACETAMINOPHEN 5-325 MG PO TABS
1.0000 | ORAL_TABLET | ORAL | Status: DC | PRN
  Administered 2018-05-28 – 2018-06-04 (×19): 1 via ORAL
  Filled 2018-05-28 (×19): qty 1

## 2018-05-28 MED ORDER — COLLAGENASE 250 UNIT/GM EX OINT
TOPICAL_OINTMENT | Freq: Every day | CUTANEOUS | Status: DC
  Administered 2018-05-30 – 2018-05-31 (×2): via TOPICAL
  Filled 2018-05-28: qty 30

## 2018-05-28 MED ORDER — SIMETHICONE 80 MG PO CHEW
80.0000 mg | CHEWABLE_TABLET | Freq: Four times a day (QID) | ORAL | Status: DC
  Administered 2018-05-28 – 2018-06-03 (×11): 80 mg via ORAL
  Filled 2018-05-28 (×31): qty 1

## 2018-05-28 MED ORDER — INSULIN ASPART 100 UNIT/ML ~~LOC~~ SOLN
3.0000 [IU] | Freq: Three times a day (TID) | SUBCUTANEOUS | Status: DC
  Administered 2018-05-29 – 2018-06-04 (×6): 3 [IU] via SUBCUTANEOUS
  Filled 2018-05-28 (×6): qty 1

## 2018-05-28 MED ORDER — ACETAMINOPHEN 500 MG PO TABS
1000.0000 mg | ORAL_TABLET | Freq: Every day | ORAL | Status: DC
  Administered 2018-05-28 – 2018-06-02 (×6): 1000 mg via ORAL
  Filled 2018-05-28 (×7): qty 2

## 2018-05-28 MED ORDER — ATORVASTATIN CALCIUM 10 MG PO TABS
10.0000 mg | ORAL_TABLET | Freq: Every day | ORAL | Status: DC
  Administered 2018-05-28 – 2018-06-02 (×6): 10 mg via ORAL
  Filled 2018-05-28 (×7): qty 1

## 2018-05-28 MED ORDER — LEVOTHYROXINE SODIUM 112 MCG PO TABS
112.0000 ug | ORAL_TABLET | Freq: Every day | ORAL | Status: DC
  Administered 2018-05-29 – 2018-06-04 (×6): 112 ug via ORAL
  Filled 2018-05-28 (×8): qty 1

## 2018-05-28 MED ORDER — MUPIROCIN 2 % EX OINT
1.0000 "application " | TOPICAL_OINTMENT | Freq: Two times a day (BID) | CUTANEOUS | Status: DC
  Administered 2018-05-29 – 2018-05-31 (×4): 1 via NASAL
  Filled 2018-05-28: qty 22

## 2018-05-28 MED ORDER — INSULIN ASPART 100 UNIT/ML ~~LOC~~ SOLN: 0.0000 [IU] | Freq: Every day | SUBCUTANEOUS | Status: DC

## 2018-05-28 MED ORDER — FERROUS SULFATE 325 (65 FE) MG PO TABS
325.0000 mg | ORAL_TABLET | Freq: Every day | ORAL | Status: DC
  Administered 2018-05-29 – 2018-06-04 (×6): 325 mg via ORAL
  Filled 2018-05-28 (×7): qty 1

## 2018-05-28 MED ORDER — INSULIN ASPART 100 UNIT/ML ~~LOC~~ SOLN
0.0000 [IU] | Freq: Three times a day (TID) | SUBCUTANEOUS | Status: DC
  Administered 2018-05-29 – 2018-06-01 (×3): 1 [IU] via SUBCUTANEOUS
  Administered 2018-06-02: 2 [IU] via SUBCUTANEOUS
  Administered 2018-06-03 – 2018-06-04 (×3): 1 [IU] via SUBCUTANEOUS
  Filled 2018-05-28 (×6): qty 1

## 2018-05-28 MED ORDER — ENOXAPARIN SODIUM 40 MG/0.4ML ~~LOC~~ SOLN: 40.0000 mg | SUBCUTANEOUS | Status: DC

## 2018-05-28 MED ORDER — LINAGLIPTIN 5 MG PO TABS
5.0000 mg | ORAL_TABLET | Freq: Every day | ORAL | Status: DC
  Administered 2018-05-29 – 2018-06-04 (×6): 5 mg via ORAL
  Filled 2018-05-28 (×7): qty 1

## 2018-05-28 MED ORDER — AMIODARONE HCL 200 MG PO TABS
200.0000 mg | ORAL_TABLET | Freq: Every day | ORAL | Status: DC
  Administered 2018-05-29 – 2018-06-04 (×5): 200 mg via ORAL
  Filled 2018-05-28 (×7): qty 1

## 2018-05-28 MED ORDER — BISACODYL 5 MG PO TBEC
5.0000 mg | DELAYED_RELEASE_TABLET | Freq: Every day | ORAL | Status: DC
  Administered 2018-05-29 – 2018-06-03 (×4): 5 mg via ORAL
  Filled 2018-05-28 (×7): qty 1

## 2018-05-28 MED ORDER — VITAMIN D3 25 MCG (1000 UNIT) PO TABS
1000.0000 [IU] | ORAL_TABLET | Freq: Every day | ORAL | Status: DC
  Administered 2018-05-29 – 2018-06-01 (×4): 1000 [IU] via ORAL
  Filled 2018-05-28 (×8): qty 1

## 2018-05-28 MED ORDER — ONDANSETRON HCL 4 MG PO TABS: 4.0000 mg | ORAL_TABLET | Freq: Four times a day (QID) | ORAL | Status: DC | PRN

## 2018-05-28 MED ORDER — POLYETHYLENE GLYCOL 3350 17 G PO PACK
17.0000 g | PACK | Freq: Every day | ORAL | Status: DC | PRN
  Administered 2018-06-02: 17 g via ORAL
  Filled 2018-05-28: qty 1

## 2018-05-28 MED ORDER — ENSURE ENLIVE PO LIQD: 237.0000 mL | Freq: Two times a day (BID) | ORAL | Status: DC

## 2018-05-28 MED ORDER — ENOXAPARIN SODIUM 30 MG/0.3ML ~~LOC~~ SOLN
30.0000 mg | SUBCUTANEOUS | Status: DC
  Administered 2018-05-28 – 2018-05-29 (×2): 30 mg via SUBCUTANEOUS
  Filled 2018-05-28 (×2): qty 0.3

## 2018-05-28 MED ORDER — SODIUM CHLORIDE 0.9% FLUSH
3.0000 mL | Freq: Two times a day (BID) | INTRAVENOUS | Status: DC
  Administered 2018-05-28 – 2018-06-02 (×11): 3 mL via INTRAVENOUS

## 2018-05-28 MED ORDER — FLUCONAZOLE 50 MG PO TABS
150.0000 mg | ORAL_TABLET | Freq: Every day | ORAL | Status: DC
  Administered 2018-05-29 – 2018-05-31 (×3): 150 mg via ORAL
  Filled 2018-05-28 (×3): qty 3

## 2018-05-28 NOTE — ED Notes (Signed)
Attempted report, was put on hold for 7 minutes. Will try again

## 2018-05-28 NOTE — H&P (Signed)
[facility] NAME: Sharon [name]    MR#:  [id number]  DATE OF BIRTH:  [birth year]  DATE OF ADMISSION:  05/28/2018  PRIMARY CARE PHYSICIAN: Rusty Aus, MD   REQUESTING/REFERRING PHYSICIAN:   CHIEF COMPLAINT:   Chief Complaint  Patient presents with  . Hypotension    HISTORY OF PRESENT ILLNESS: Sharon [name]  is a 85 y.o. female with a known history per below, lives in nursing facility, was in the wound clinic earlier today where she was found to have blood pressure in the 60s, sent to the emergency room for further evaluation/care, patient recently started on trazodone-thought to be leading to low blood pressure as she took this in addition to Percocet, patient complaining of acute on chronic diffuse pain primarily in her back, in the emergency room patient was found to be bradycardic with heart rate in the 40s, pressure in the 60s-improved with IV fluids for rehydration, potassium was 5.2, bicarb 18, creatinine 1.7 with baseline around 1.4, AST 170, ALT 55, ultrasound noted for prior cholecystectomy, urinalysis consistent with UTI, patient evaluated in the emergency room, no apparent distress, resting comfortably in bed, patient now be admitted for acute hypotension suspected due to medication, acute UTI.  PAST MEDICAL HISTORY:   Past Medical History:  Diagnosis Date  . Anemia   . Atrial fibrillation ([place])   . Cardiac arrest ([place])   . Cataract   . CHF (congestive heart failure) ([place])   . Diabetes mellitus without complication ([place])   . Edema    feet/ankles occas  . Hip fracture ([place])   . Hyperlipidemia   . Hypertension   . Hypothyroid   . Neuropathy   . Osteomyelitis ([place])    left first metatarsal  . Stroke ([place])     PAST SURGICAL HISTORY:  Past Surgical History:  Procedure Laterality Date  . ACHILLES TENDON SURGERY Left 02/08/2015   Procedure: ACHILLES LENGTHENING/KIDNER;  Surgeon: Albertine Patricia, DPM;  Location: [facility]  ORS;  Service: Podiatry;  Laterality: Left;  . APPENDECTOMY    . CARDIAC CATHETERIZATION  08/25/13  . CATARACT EXTRACTION W/PHACO Left 07/20/2014   Procedure: CATARACT EXTRACTION PHACO AND INTRAOCULAR LENS PLACEMENT (IOC);  Surgeon: Leandrew Koyanagi, MD;  Location: [facility] ORS;  Service: Ophthalmology;  Laterality: Left;  [place]  1:18                 AP     23.6             CDE   9.69      lot #[phone]  . CHOLECYSTECTOMY    . CORONARY ANGIOPLASTY    . HALLUX VALGUS AKIN Left 02/08/2015   Procedure: HALLUX VALGUS AKIN/ KELLER;  Surgeon: Albertine Patricia, DPM;  Location: [facility] ORS;  Service: Podiatry;  Laterality: Left;  IVA with Local needs 1 hour for this case   . HEMIARTHROPLASTY HIP Right   . HEMIARTHROPLASTY HIP Left   . INCISION AND DRAINAGE Left 10/11/2014   Procedure: Removal of infected tibial sessmoid;  Surgeon: Albertine Patricia, DPM;  Location: [facility] ORS;  Service: Podiatry;  Laterality: Left;  . IRRIGATION AND DEBRIDEMENT FOOT Right 03/21/2017   Procedure: IRRIGATION AND DEBRIDEMENT FOOT right great toe amputation;  Surgeon: Sharlotte Alamo, DPM;  Location: [facility] ORS;  Service: Podiatry;  Laterality: Right;  . IRRIGATION AND DEBRIDEMENT FOOT Right 04/18/2017   Procedure: IRRIGATION AND DEBRIDEMENT FOOT and application wound vac;  Surgeon: Albertine Patricia, DPM;  Location: [facility] ORS;  Service: Podiatry;  Laterality: Right;  . PERIPHERAL VASCULAR BALLOON ANGIOPLASTY Right 04/17/2017   Procedure: PERIPHERAL VASCULAR BALLOON ANGIOPLASTY;  Surgeon: Katha Cabal, MD;  Location: [facility];  Service: Cardiovascular;  Laterality: Right;    SOCIAL HISTORY:  Social History   Tobacco Use  . Smoking status: Former Smoker    Packs/day: 0.50    Years: 7.00    Pack years: 3.50    Types: Cigarettes    Last attempt to quit: 05/30/1989    Years since quitting: 29.0  . Smokeless tobacco: Never Used  Substance Use Topics  . Alcohol use: No    Alcohol/week: 0.0 standard drinks    FAMILY  HISTORY:  Family History  Problem Relation Age of Onset  . Heart attack Mother     DRUG ALLERGIES:  Allergies  Allergen Reactions  . Carbamazepine Other (See Comments)    Reaction: unknown  . Cymbalta [Duloxetine Hcl] Other (See Comments)    Confusion, disorientation  . Duloxetine     Other reaction(s): Other (See Comments) Confusion, disorientation  . Lyrica [Pregabalin] Other (See Comments)    Patient and son states this makes the patient confused.  . Temazepam     Other reaction(s): Hallucination    REVIEW OF SYSTEMS:   CONSTITUTIONAL: No fever, fatigue or weakness.  EYES: No blurred or double vision.  EARS, NOSE, AND THROAT: No tinnitus or ear pain.  RESPIRATORY: No cough, shortness of breath, wheezing or hemoptysis.  CARDIOVASCULAR: No chest pain, orthopnea, edema.  GASTROINTESTINAL: No nausea, vomiting, diarrhea or abdominal pain.  GENITOURINARY: No dysuria, hematuria.  ENDOCRINE: No polyuria, nocturia,  HEMATOLOGY: No anemia, easy bruising or bleeding SKIN: No rash or lesion. MUSCULOSKELETAL: Diffuse body pain, primarily in the back    NEUROLOGIC: No tingling, numbness, weakness.  PSYCHIATRY: No anxiety or depression.   MEDICATIONS AT HOME:  Prior to Admission medications   Medication Sig Start Date End Date Taking? Authorizing Provider  amiodarone (PACERONE) 200 MG tablet Take 1 tablet (200 mg total) by mouth daily. 03/24/17  Yes Gladstone Lighter, MD  atorvastatin (LIPITOR) 10 MG tablet Take 10 mg by mouth daily at 10 pm.    Yes [provider]  bisacodyl (DULCOLAX) 5 MG EC tablet Take 5 mg by mouth daily. 04/26/18  Yes [provider]  cholecalciferol (VITAMIN D) 1000 units tablet Take 1,000 Units by mouth daily.   Yes [provider]  collagenase (SANTYL) ointment Apply topically daily. 03/12/18  Yes Ojie, Jude, MD  ferrous sulfate 325 (65 FE) MG tablet Take 325 mg by mouth daily.    Yes [provider]  fluconazole  (DIFLUCAN) 150 MG tablet Take 150 mg by mouth daily. 05/10/18  Yes [provider]  furosemide (LASIX) 20 MG tablet Take 20 mg by mouth daily.  03/15/18  Yes [provider]  gabapentin (NEURONTIN) 300 MG capsule Take 300 mg by mouth 3 (three) times daily. 04/24/18  Yes [provider]  insulin aspart (NOVOLOG) 100 UNIT/ML injection Inject 4-8 Units into the skin 3 (three) times daily. 04/24/18  Yes [provider]  insulin glargine (LANTUS) 100 UNIT/ML injection Inject 15 Units into the skin at bedtime. 04/24/18 04/24/19 Yes [provider]  JANUVIA 50 MG tablet Take 50 mg by mouth daily. Take along with Amaryl 02/15/18  Yes [provider]  levothyroxine (SYNTHROID, LEVOTHROID) 112 MCG tablet Take 112 mcg by mouth daily before breakfast. 30 minutes before breakfast   Yes [provider]  linaclotide (LINZESS) 145 MCG CAPS capsule Take 145 mcg by mouth daily. 05/11/18  Yes [provider]  metoprolol tartrate (LOPRESSOR) 25 MG tablet Take 1 tablet (25 mg total) by mouth 2 (two) times daily. 04/21/17  Yes Pyreddy, Reatha Harps, MD  potassium chloride SA (K-DUR) 20 MEQ tablet Take 20 mEq by mouth daily. 05/03/18  Yes [provider]  simethicone (MYLICON) 80 MG chewable tablet Chew 80 mg by mouth every 6 (six) hours. 05/11/18  Yes [provider]  traZODone (DESYREL) 50 MG tablet Take 50 mg by mouth at bedtime. For anxiety 06/24/17 06/24/18 Yes [provider]  acetaminophen (TYLENOL) 500 MG tablet Take 1,000 mg by mouth at bedtime. For pain management    [provider]  acetaminophen (TYLENOL) 650 MG CR tablet Take 650 mg by mouth every 8 (eight) hours as needed. For discomfort    [provider]  Amino Acids-Protein Hydrolys (FEEDING SUPPLEMENT, PRO-STAT SUGAR FREE 64,) LIQD Take 30 mLs by mouth 2 (two) times daily between meals.    [provider]  GLUCERNA (GLUCERNA) LIQD Take 237 mLs by mouth 2  (two) times daily between meals.    [provider]  HYDROcodone-acetaminophen (NORCO/VICODIN) 5-325 MG tablet Take 1 tablet by mouth 3 (three) times daily. 04/28/18   [provider]  mupirocin ointment (BACTROBAN) 2 % Place 1 application into the nose 2 (two) times daily.    [provider]  oxyCODONE-acetaminophen (PERCOCET/ROXICET) 5-325 MG tablet Take 1 tablet by mouth every 4 (four) hours as needed. 05/24/18   [provider]  traMADol (ULTRAM) 50 MG tablet Take 1 tablet (50 mg total) by mouth every 6 (six) hours as needed for moderate pain. 03/27/18   Paulette Blanch, MD      PHYSICAL EXAMINATION:   VITAL SIGNS: Blood pressure 129/64, pulse (!) 44, temperature (!) 97.2 F (36.2 C), temperature source Oral, resp. rate 14, height 5\' 5"  (1.651 m), weight 70 kg, SpO2 96 %.  GENERAL:  85 y.o.-year-old patient lying in the bed with no acute distress.  Cachectic/frail appearing EYES: Pupils equal, round, reactive to light and accommodation. No scleral icterus. Extraocular muscles intact.  HEENT: Head atraumatic, normocephalic. Oropharynx and nasopharynx clear.  NECK:  Supple, no jugular venous distention. No thyroid enlargement, no tenderness.  LUNGS: Normal breath sounds bilaterally, no wheezing, rales,rhonchi or crepitation. No use of accessory muscles of respiration.  CARDIOVASCULAR: S1, S2 normal. No murmurs, rubs, or gallops.  ABDOMEN: Soft, nontender, nondistended. Bowel sounds present. No organomegaly or mass.  EXTREMITIES: No pedal edema, cyanosis, or clubbing.  Diffuse muscular wasting, bilateral lower extremity leg dressings recently placed at wound care clinic  NEUROLOGIC: Cranial nerves II through XII are intact. Muscle strength 5/5 in all extremities. Sensation intact. Gait not checked.  PSYCHIATRIC: The patient is alert and oriented x 3.  SKIN: No obvious rash, lesion, or ulcer.   LABORATORY PANEL:   CBC Recent Labs  Lab 05/28/18 1038  WBC  9.3  HGB 14.0  HCT 45.9  PLT 303  MCV 89.5  MCH 27.3  MCHC 30.5  RDW 15.2   ------------------------------------------------------------------------------------------------------------------  Chemistries  Recent Labs  Lab 05/28/18 1038  NA 134*  K 5.2*  CL 104  CO2 18*  GLUCOSE 175*  BUN 25*  CREATININE 1.71*  CALCIUM 8.3*  AST 170*  ALT 55*  ALKPHOS 357*  BILITOT 0.7   ------------------------------------------------------------------------------------------------------------------ estimated creatinine clearance is 24.9 mL/min (A) (by C-G formula based on SCr of 1.71 mg/dL (  H)). ------------------------------------------------------------------------------------------------------------------ No results for input(s): TSH, T4TOTAL, T3FREE, THYROIDAB in the last 72 hours.  Invalid input(s): FREET3   Coagulation profile No results for input(s): INR, PROTIME in the last 168 hours. ------------------------------------------------------------------------------------------------------------------- No results for input(s): DDIMER in the last 72 hours. -------------------------------------------------------------------------------------------------------------------  Cardiac Enzymes Recent Labs  Lab 05/28/18 1038  TROPONINI <0.03   ------------------------------------------------------------------------------------------------------------------ Invalid input(s): POCBNP  ---------------------------------------------------------------------------------------------------------------  Urinalysis    Component Value Date/Time   COLORURINE YELLOW (A) 05/28/2018 1318   APPEARANCEUR TURBID (A) 05/28/2018 1318   APPEARANCEUR Hazy 08/18/2013 0253   LABSPEC 1.018 05/28/2018 1318   LABSPEC 1.012 08/18/2013 0253   PHURINE 5.0 05/28/2018 1318   GLUCOSEU NEGATIVE 05/28/2018 1318   GLUCOSEU >=500 08/18/2013 0253   HGBUR SMALL (A) 05/28/2018 1318   BILIRUBINUR NEGATIVE  05/28/2018 1318   BILIRUBINUR Negative 08/18/2013 0253   KETONESUR NEGATIVE 05/28/2018 1318   PROTEINUR 100 (A) 05/28/2018 1318   NITRITE NEGATIVE 05/28/2018 1318   LEUKOCYTESUR MODERATE (A) 05/28/2018 1318   LEUKOCYTESUR Trace 08/18/2013 0253     RADIOLOGY: US Abdomen Limited Ruq  Result Date: 05/28/2018 CLINICAL DATA:  Elevated liver function tests. EXAM: ULTRASOUND ABDOMEN LIMITED RIGHT UPPER QUADRANT COMPARISON:  CT scan of April 25, 2018. FINDINGS: Gallbladder: Status post cholecystectomy. Common bile duct: Diameter: Dilated at 14 mm most likely due to post cholecystectomy status. Liver: No focal lesion identified. Within normal limits in parenchymal echogenicity. Portal vein is patent on color Doppler imaging with normal direction of blood flow towards the liver. IMPRESSION: Status post cholecystectomy. No other abnormality seen in the right upper quadrant of the abdomen. Electronically Signed   By: Marijo Conception M.D.   On: 05/28/2018 12:16    EKG: Orders placed or performed during the hospital encounter of 05/28/18  . ED EKG  . ED EKG  . EKG 12-Lead  . EKG 12-Lead  . EKG 12-Lead  . EKG 12-Lead    IMPRESSION AND PLAN: *Acute severe hypotension Suspected due to medication reaction Admit to telemetry bed, cycle cardiac enzymes, hold antihypertensive agents/diuretics IV fluids for gentle rehydration, vitals per routine, and make changes as per necessary  *Acute probable urinary tract infection Empiric Rocephin and follow-up on cultures  *Acute on chronic pain syndrome Noted history of L1 fracture Stable Pain protocol provided blood pressure stable  *Chronic diabetes mellitus type 2 Continue home regiment, carbohydrate consistent diet, sliding scale insulin with Accu-Cheks per routine  *Chronic bilateral lower extremity leg wounds Continue local wound care, wound care nurse consulted for expert opinion  *Chronic adult failure to thrive/malnutrition Dietary  consulted  *History of atrial fibrillation Stable Continue amiodarone, Lopressor on hold given hypotension, not on oral anticoagulation  *Chronic hypothyroidism, unspecified Continue Synthroid, check TSH  DVT prophylaxis-Lovenox subcu Disposition back to skilled nursing facility in 1 to 2 days barring any complications  All the records are reviewed and case discussed with ED provider. Management plans discussed with the patient, family and they are in agreement.  CODE STATUS:full Code Status History    Date Active Date Inactive Code Status Order ID Comments User Context   03/09/2018 0309 03/12/2018 1654 Full Code [id number]  Lance Coon, MD Inpatient   04/16/2017 1705 04/21/2017 2102 Full Code [id number]  Fritzi Mandes, MD Inpatient   03/23/2017 1422 03/23/2017 2233 Full Code [id number]  Gladstone Lighter, MD Inpatient   03/20/2017 1121 03/23/2017 1422 DNR [id number]  Bettey Costa, MD Inpatient   03/20/2017 0140 03/20/2017 1121 Full Code [id number]  Lance Coon, MD ED  07/07/2015 0353 07/09/2015 1520 Full Code [id number]  Saundra Shelling, MD Inpatient   02/07/2015 1605 02/12/2015 1921 Full Code [id number]  Dustin Flock, MD Inpatient   11/05/2014 1551 11/09/2014 1536 Full Code [id number]  Gladstone Lighter, MD Inpatient   10/10/2014 1112 10/17/2014 1750 Full Code [id number]  Epifanio Lesches, MD Inpatient    Advance Directive Documentation     Most Recent Value  Type of Advance Directive  Healthcare Power of Attorney  Pre-existing out of facility DNR order (yellow form or pink MOST form)  -  "MOST" Form in Place?  -       TOTAL TIME TAKING CARE OF THIS PATIENT: 40 minutes.    Avel Peace  M.D on 05/28/2018   Between 7am to 6pm - Pager - [phone]  After 6pm go to [email or web address] - password [email or web address] [facility]  Office  [phone]  CC: Primary care physician; Rusty Aus, MD   Note: This dictation was prepared with Dragon dictation along with smaller phrase  technology. Any transcriptional errors that result from this process are unintentional.

## 2018-05-28 NOTE — ED Notes (Signed)
Pt arrived from wound clinic with complaint of pain to back and lower abdomen. Pt Hs dressings covering bilateral lower legs from base of toes to just below knees. Unable to assess pedal pulses but cap refill <4 sec. Sacral dressing evident. Pt A&Ox4 but seems to have some confusion about her morning schedule. She couldn't remember if she came from the wound clinic or from [facility].

## 2018-05-28 NOTE — Progress Notes (Signed)
Initial Nutrition Assessment  DOCUMENTATION CODES:   Not applicable  INTERVENTION:   Recommend Ensure Enlive po BID, each supplement provides 350 kcal and 20 grams of protein  Recommend MVI daily   Recommend vitamin C '250mg'$  po BID  Recommend Liberal diet   NUTRITION DIAGNOSIS:   Inadequate oral intake related to acute illness as evidenced by other (comment)(per chart review ).  GOAL:   Patient will meet greater than or equal to 90% of their needs  MONITOR:   PO intake, Supplement acceptance, Labs, Weight trends, I & O's, Skin  REASON FOR ASSESSMENT:   Consult Assessment of nutrition requirement/status  ASSESSMENT:   85 y/o female with h/o DM, CHF, Stroke, hip fx admitted with hypotension   RD working remotely.  Suspect pt with poor appetite and oral intake pta. RD will add supplements and MVI to help pt meet her estimated needs. Per chart, pt appears fairly weight stable pta. Recommend liberal diet as pt not likely eating enough to exceed nutrient limits.     Medications reviewed  Labs reviewed: Na 134(L), K 5.2(H), BUN 25(H), creat 1.71(H), alk phos 357(H), AST 170(H) ALT 55(H)  Unable to complete Nutrition-Focused physical exam at this time.   Diet Order:   Diet Order    None     EDUCATION NEEDS:   Not appropriate for education at this time  Skin:  Skin Assessment: Reviewed RN Assessment(wounds on bilateral LE)  Last BM:  pta  Height:   Ht Readings from Last 1 Encounters:  05/28/18 '5\' 5"'$  (1.651 m)    Weight:   Wt Readings from Last 1 Encounters:  05/28/18 70 kg    Ideal Body Weight:  56.8 kg  BMI:  Body mass index is 25.68 kg/m.  Estimated Nutritional Needs:   Kcal:  1400-1600kcal/day   Protein:  70-80g/day   Fluid:  >1.4L/day   Koleen Distance MS, RD, LDN Pager #- [phone] Office#- [phone] After Hours Pager: [phone]

## 2018-05-28 NOTE — ED Notes (Signed)
2nd attempt to call report. Placed on hold for 10 minutes. Will try again

## 2018-05-28 NOTE — ED Notes (Signed)
This RN received call from staff at facility who is requesting a psych evaluation because the pt calls 911 frequently from her own phone.  Staff was concerned because pt frequently arrives at ER and states that she is not sure why she is here.  Staff also mentioned that EMS personnel are reporting the facility because she calls so often, "but we can't take her phone away."  Assured staff that it is the patient's right to call 911 if she feels the need and that this RN not sure what a psych eval would benefit the pt.  Also discussed with staff that highest concern at this time is the pt's BP and that her medical condition would be addressed and then other concerns could be discussed.  Staff member verbalizes understanding of information shared at this time.

## 2018-05-28 NOTE — Congregational Nurse Program (Signed)
[facility] Nurse wound consult note Patient receiving care in [facility].  A SARS Coronavirus 2 result is pending. Reason for Consult: "eval and treat" Wound type:  Unknown.  No mention of wounds in the ED physician notes.  Tom, her primary RN in the ED states both LE are wrapped from the toes to the knees, and she has some type of dressing on her sacrum.  I explained to him that the dressings should be removed, the areas assessed, photos placed in the chart by the provider, and wound documentation entered into the flowsheet.  [facility] nurses do not respond to "eval and treat" orders.  He stated his understanding.  Thank you for the consult. Please re-consult the [place] team if needed.  Val Riles, RN, MSN, CWOCN, CNS-BC, pager [phone]

## 2018-05-28 NOTE — ED Notes (Signed)
Pts son called and stated that she was given both percocet and trazedone this morning before she went to the wound clinic. This was the pts first dose of trazedone and he suspects that it was responsible for her hypotensive episode this morning. He also stated that she is suffering from a compression fracture to L1 and may have a UTI which the nursing staff at her facility was supposed to test for but did not. Dr Kerman Passey notified.

## 2018-05-28 NOTE — ED Notes (Addendum)
ED TO INPATIENT HANDOFF REPORT  ED Nurse Name and Phone #:  Gershon Mussel RN  [phone]  S Name/Age/Gender Sharon [name] 85 y.o. female Room/Bed: ED14A/ED14A  Code Status   Code Status: Prior  Home/SNF/Other Home Patient oriented to: self, place, time and situation Is this baseline? Yes   Triage Complete: Triage complete  Chief Complaint pain  Triage Note Pt to ER via EMS from wound clinic.  Pt resides at [facility].  Pt at wound clinic this AM for chronic wounds to bilateral feet and legs and back.  PT states she continues in pain despite AM pain medications.  Pt took all meds this AM.  Pt noted to have BP of 60/32 at wound clinic.  EMS reports 80/44.  Pt alert and able to answer all questions.   Allergies Allergies  Allergen Reactions  . Carbamazepine Other (See Comments)    Reaction: unknown  . Cymbalta [Duloxetine Hcl] Other (See Comments)    Confusion, disorientation  . Duloxetine     Other reaction(s): Other (See Comments) Confusion, disorientation  . Lyrica [Pregabalin] Other (See Comments)    Patient and son states this makes the patient confused.  . Temazepam     Other reaction(s): Hallucination    Level of Care/Admitting Diagnosis ED Disposition    ED Disposition Condition [facility] Area: [place] [[id number]]  Level of Care: Telemetry [5]  Covid Evaluation: Screening Protocol (No Symptoms)  Diagnosis: Hypotension [631497]  Admitting Physician: Gorden Harms [[id number]]  Attending Physician: Gorden Harms [[id number]]  Estimated length of stay: past midnight tomorrow  Certification:: I certify this patient will need inpatient services for at least 2 midnights  PT Class (Do Not Modify): Inpatient [101]  PT Acc Code (Do Not Modify): Private [1]       B Medical/Surgery History Past Medical History:  Diagnosis Date  . Anemia   . Atrial fibrillation ([place])   . Cardiac arrest ([place])   . Cataract   . CHF (congestive heart  failure) ([place])   . Diabetes mellitus without complication ([place])   . Edema    feet/ankles occas  . Hip fracture ([place])   . Hyperlipidemia   . Hypertension   . Hypothyroid   . Neuropathy   . Osteomyelitis ([place])    left first metatarsal  . Stroke [facility])    Past Surgical History:  Procedure Laterality Date  . ACHILLES TENDON SURGERY Left 02/08/2015   Procedure: ACHILLES LENGTHENING/KIDNER;  Surgeon: Albertine Patricia, DPM;  Location: [facility] ORS;  Service: Podiatry;  Laterality: Left;  . APPENDECTOMY    . CARDIAC CATHETERIZATION  08/25/13  . CATARACT EXTRACTION W/PHACO Left 07/20/2014   Procedure: CATARACT EXTRACTION PHACO AND INTRAOCULAR LENS PLACEMENT (IOC);  Surgeon: Leandrew Koyanagi, MD;  Location: [facility] ORS;  Service: Ophthalmology;  Laterality: Left;  [place]  1:18                 AP     23.6             CDE   9.69      lot #[phone]  . CHOLECYSTECTOMY    . CORONARY ANGIOPLASTY    . HALLUX VALGUS AKIN Left 02/08/2015   Procedure: HALLUX VALGUS AKIN/ KELLER;  Surgeon: Albertine Patricia, DPM;  Location: [facility] ORS;  Service: Podiatry;  Laterality: Left;  IVA with Local needs 1 hour for this case   . HEMIARTHROPLASTY HIP Right   . HEMIARTHROPLASTY HIP Left   . INCISION  AND DRAINAGE Left 10/11/2014   Procedure: Removal of infected tibial sessmoid;  Surgeon: Albertine Patricia, DPM;  Location: [facility] ORS;  Service: Podiatry;  Laterality: Left;  . IRRIGATION AND DEBRIDEMENT FOOT Right 03/21/2017   Procedure: IRRIGATION AND DEBRIDEMENT FOOT right great toe amputation;  Surgeon: Sharlotte Alamo, DPM;  Location: [facility] ORS;  Service: Podiatry;  Laterality: Right;  . IRRIGATION AND DEBRIDEMENT FOOT Right 04/18/2017   Procedure: IRRIGATION AND DEBRIDEMENT FOOT and application wound vac;  Surgeon: Albertine Patricia, DPM;  Location: [facility] ORS;  Service: Podiatry;  Laterality: Right;  . PERIPHERAL VASCULAR BALLOON ANGIOPLASTY Right 04/17/2017   Procedure: PERIPHERAL VASCULAR BALLOON ANGIOPLASTY;  Surgeon: Katha Cabal, MD;   Location: [facility] [place] [facility];  Service: Cardiovascular;  Laterality: Right;     A IV Location/Drains/Wounds Patient Lines/Drains/Airways Status   Active Line/Drains/Airways    Name:   Placement date:   Placement time:   Site:   Days:   Peripheral IV 05/28/18 Right Hand   05/28/18    1039    Hand   less than 1   Sheath 04/17/17 Left   04/17/17    1225    -   406   Sheath 04/17/17 Right   04/17/17    1235    -   406   Negative Pressure Wound Therapy Foot Right   04/18/17    0940    -   405   Incision (Closed) 11/05/14 Foot Upper;Left   11/05/14    1638     1300   Incision (Closed) 02/08/15 Foot Left   02/08/15    1142     1205   Incision (Closed) 03/21/17 Foot Right   03/21/17    0936     433   Incision (Closed) 04/18/17 Foot Right   04/18/17    0959     405   Incision - 1 Port Other (Comment) Left   03/21/17    0939     433   Pressure Injury 04/16/17 Stage III -  Full thickness tissue loss. Subcutaneous fat may be visible but bone, tendon or muscle are NOT exposed.   04/16/17    1800     407   Pressure Injury 04/16/17 Unstageable - Full thickness tissue loss in which the base of the ulcer is covered by slough (yellow, tan, gray, green or brown) and/or eschar (tan, brown or black) in the wound bed. Hard area on center of left heel covered in ha   04/16/17    1800     407   Pressure Injury 03/09/18 Stage I -  Intact skin with non-blanchable redness of a localized area usually over a bony prominence. very small open area   03/09/18    0430     80   Pressure Injury 03/10/18 Deep Tissue Injury - Purple or maroon localized area of discolored intact skin or blood-filled blister due to damage of underlying soft tissue from pressure and/or shear. dark purple, lateral right heel, bottom of left heel   03/10/18    2056     79   Wound / Incision (Open or Dehisced) 04/16/17 Diabetic ulcer;Incision - Dehisced Foot Right wound wrapped unable to assess   04/16/17    1800    Foot   407           Intake/Output Last 24 hours  Intake/Output Summary (Last 24 hours) at 05/28/2018 1529 Last data filed at 05/28/2018 1343 Gross per 24 hour  Intake  1000 ml  Output -  Net 1000 ml    Labs/Imaging Results for orders placed or performed during the hospital encounter of 05/28/18 (from the past 48 hour(s))  CBC     Status: Abnormal   Collection Time: 05/28/18 10:38 AM  Result Value Ref Range   WBC 9.3 4.0 - 10.5 K/uL   RBC 5.13 (H) 3.87 - 5.11 MIL/uL   Hemoglobin 14.0 12.0 - 15.0 g/dL   HCT 45.9 36.0 - 46.0 %   MCV 89.5 80.0 - 100.0 fL   MCH 27.3 26.0 - 34.0 pg   MCHC 30.5 30.0 - 36.0 g/dL   RDW 15.2 11.5 - 15.5 %   Platelets 303 150 - 400 K/uL   nRBC 0.0 0.0 - 0.2 %    Comment: Performed at [facility], [address] [place], [place] [address]  Comprehensive metabolic panel     Status: Abnormal   Collection Time: 05/28/18 10:38 AM  Result Value Ref Range   Sodium 134 (L) 135 - 145 mmol/L   Potassium 5.2 (H) 3.5 - 5.1 mmol/L    Comment: HEMOLYSIS AT THIS LEVEL MAY AFFECT RESULT   Chloride 104 98 - 111 mmol/L   CO2 18 (L) 22 - 32 mmol/L   Glucose, Bld 175 (H) 70 - 99 mg/dL   BUN 25 (H) 8 - 23 mg/dL   Creatinine, Ser 1.71 (H) 0.44 - 1.00 mg/dL   Calcium 8.3 (L) 8.9 - 10.3 mg/dL   Total Protein 6.3 (L) 6.5 - 8.1 g/dL   Albumin 2.9 (L) 3.5 - 5.0 g/dL   AST 170 (H) 15 - 41 U/L   ALT 55 (H) 0 - 44 U/L   Alkaline Phosphatase 357 (H) 38 - 126 U/L   Total Bilirubin 0.7 0.3 - 1.2 mg/dL   GFR calc non Af Amer 27 (L) >60 mL/min   GFR calc Af Amer 32 (L) >60 mL/min   Anion gap 12 5 - 15    Comment: Performed at [facility], [address] [place], [place] [address]  Troponin I - ONCE - STAT     Status: None   Collection Time: 05/28/18 10:38 AM  Result Value Ref Range   Troponin I <0.03 <0.03 ng/mL    Comment: Performed at [facility], [address] [place], [place] [address]  Urinalysis, Complete w Microscopic     Status: Abnormal    Collection Time: 05/28/18  1:18 PM  Result Value Ref Range   Color, Urine YELLOW (A) YELLOW   APPearance TURBID (A) CLEAR   Specific Gravity, Urine 1.018 1.005 - 1.030   pH 5.0 5.0 - 8.0   Glucose, UA NEGATIVE NEGATIVE mg/dL   Hgb urine dipstick SMALL (A) NEGATIVE   Bilirubin Urine NEGATIVE NEGATIVE   Ketones, ur NEGATIVE NEGATIVE mg/dL   Protein, ur 100 (A) NEGATIVE mg/dL   Nitrite NEGATIVE NEGATIVE   Leukocytes,Ua MODERATE (A) NEGATIVE   RBC / HPF 11-20 0 - 5 RBC/hpf   WBC, UA >50 (H) 0 - 5 WBC/hpf   Bacteria, UA MANY (A) NONE SEEN   Squamous Epithelial / LPF 0-5 0 - 5   WBC Clumps PRESENT    Hyaline Casts, UA PRESENT     Comment: Performed at [facility], [address], [place], [place] [address]   US Abdomen Limited Ruq  Result Date: 05/28/2018 CLINICAL DATA:  Elevated liver function tests. EXAM: ULTRASOUND ABDOMEN LIMITED RIGHT UPPER QUADRANT COMPARISON:  CT scan of April 25, 2018. FINDINGS: Gallbladder: Status post cholecystectomy. Common bile duct: Diameter: Dilated at 14 mm most likely due to post cholecystectomy status. Liver: No focal lesion identified. Within normal limits in parenchymal echogenicity. Portal vein is patent on color Doppler imaging with normal direction of blood flow towards the liver. IMPRESSION: Status post cholecystectomy. No other abnormality seen in the right upper quadrant of the abdomen. Electronically Signed   By: Marijo Conception M.D.   On: 05/28/2018 12:16    Pending Labs Unresulted Labs (From admission, onward)    Start     Ordered   05/28/18 1358  Urine Culture  Add-on,   AD     05/28/18 1358   05/28/18 1335  SARS Coronavirus 2 (CEPHEID - Performed in [place] hospital lab), Hosp Order  (Asymptomatic Patients Labs)  Once,   STAT    Question:  Rule Out  Answer:  Yes   05/28/18 1334   Signed and Held  CBC  (enoxaparin (LOVENOX)    CrCl >/= 30 ml/min)  Once,   R    Comments:  Baseline for enoxaparin therapy IF NOT ALREADY DRAWN.   Notify MD if PLT < 100 K.    Signed and Held   Signed and Held  Creatinine, serum  (enoxaparin (LOVENOX)    CrCl >/= 30 ml/min)  Once,   R    Comments:  Baseline for enoxaparin therapy IF NOT ALREADY DRAWN.    Signed and Held   Signed and Held  Creatinine, serum  (enoxaparin (LOVENOX)    CrCl >/= 30 ml/min)  Weekly,   R    Comments:  while on enoxaparin therapy    Signed and Held   Signed and Held  Basic metabolic panel  Tomorrow morning,   R     Signed and Held          Vitals/Pain Today's Vitals   05/28/18 1130 05/28/18 1230 05/28/18 1321 05/28/18 1527  BP: (!) 105/52 140/62 129/64 131/60  Pulse: (!) 141 (!) 45 (!) 44 (!) 50  Resp: 13 13 14 16   Temp:      TempSrc:      SpO2: 100% 94% 96% 96%  Weight:      Height:      PainSc:    4     Isolation Precautions No active isolations  Medications Medications  cefTRIAXone (ROCEPHIN) 1 g in sodium chloride 0.9 % 100 mL IVPB (has no administration in time range)  sodium chloride 0.9 % bolus 1,000 mL (0 mLs Intravenous Stopped 05/28/18 1343)    Mobility walks with device Low fall risk   Focused Assessments Cardiac Assessment Handoff:  Cardiac Rhythm: Sinus bradycardia Lab Results  Component Value Date   CKTOTAL 73 12/25/2013   CKMB 2.8 12/25/2013   TROPONINI <0.03 05/28/2018   No results found for: DDIMER Does the Patient currently have chest pain? No     R Recommendations: See Admitting Provider Note  Report given to:  Tammy RN  Additional Notes:

## 2018-05-28 NOTE — ED Triage Notes (Signed)
Pt to ER via EMS from wound clinic.  Pt resides at [facility].  Pt at wound clinic this AM for chronic wounds to bilateral feet and legs and back.  PT states she continues in pain despite AM pain medications.  Pt took all meds this AM.  Pt noted to have BP of 60/32 at wound clinic.  EMS reports 80/44.  Pt alert and able to answer all questions.

## 2018-05-28 NOTE — Progress Notes (Signed)
Family Meeting Note  Advance Directive:yes  Today a meeting took place with the Patient.  Patient is able to participate   The following clinical team members were present during this meeting:MD  The following were discussed:Patient's diagnosis: 85 y.o. female with a known history per below, lives in nursing facility, was in the wound clinic earlier today where she was found to have blood pressure in the 60s, sent to the emergency room for further evaluation/care, patient recently started on trazodone-thought to be leading to low blood pressure as she took this in addition to Percocet, patient complaining of acute on chronic diffuse pain primarily in her back, in the emergency room patient was found to be bradycardic with heart rate in the 40s, pressure in the 60s-improved with IV fluids for rehydration, potassium was 5.2, bicarb 18, creatinine 1.7 with baseline around 1.4, AST 170, ALT 55, ultrasound noted for prior cholecystectomy, urinalysis consistent with UTI, patient evaluated in the emergency room, no apparent distress, resting comfortably in bed, patient now be admitted for acute hypotension suspected due to medication, acute UTI., Patient's progosis: Unable to determine and Goals for treatment: Full Code  Additional follow-up to be provided: prn  Time spent during discussion:20 minutes  Gorden Harms, MD

## 2018-05-28 NOTE — ED Provider Notes (Signed)
[facility] Emergency Department Provider Note  Time seen: 10:20 AM  I have reviewed the triage vital signs and the nursing notes.   HISTORY  Chief Complaint Low blood pressure  HPI Sharon [name] is a 85 y.o. female with a past medical history of anemia, atrial fibrillation, CHF, diabetes, hypertension, hyperlipidemia, CVA, CKD, sacral ulcers, presents to the emergency department from her nursing facility for low blood pressure.  Per EMS patient had a blood pressure in the 29H systolic at her nursing facility, blood pressure of 80 systolic by EMS, 64 systolic in the emergency department.  Patient took all of her morning medications including pain medication.  Patient is complaining of back and buttocks pain which is her chronic pain.  Moderate dull pain.  Is asking for something else for pain.  Patient denies any fever, cough or congestion.  Denies any shortness of breath.  Patient is awake alert and oriented x4.  Past Medical History:  Diagnosis Date  . Anemia   . Atrial fibrillation ([place])   . Cardiac arrest ([place])   . Cataract   . CHF (congestive heart failure) ([place])   . Diabetes mellitus without complication ([place])   . Edema    feet/ankles occas  . Hip fracture ([place])   . Hyperlipidemia   . Hypertension   . Hypothyroid   . Neuropathy   . Osteomyelitis ([place])    left first metatarsal  . Stroke [facility])     Patient Active Problem List   Diagnosis Date Noted  . Lymphedema 03/24/2018  . Pressure injury of skin 03/12/2018  . Atherosclerosis of native arteries of the extremities with ulceration ([place]) 05/07/2017  . Non-healing ulcer ([place]) 04/16/2017  . Diabetic neuropathy ([place]) 04/07/2017  . Septic shock ([place]) 03/20/2017  . CKD (chronic kidney disease), stage III ([place]) 03/19/2017  . Goals of care, counseling/discussion 03/03/2017  . PAD (peripheral artery disease) ([place]) 02/03/2017  . Abdominal aortic stenosis 02/03/2017  . Bilateral lower extremity edema  02/03/2017  . Anemia 01/29/2017  . B12 deficiency 01/29/2017  . Pancreatic mass 01/29/2017  . Hyponatremia 07/07/2015  . Foot ulcer ([place]) 02/07/2015  . Acute on chronic renal failure ([place]) 11/05/2014  . Diabetic foot infection ([place]) 10/10/2014  . Chronic diastolic heart failure ([place]) 05/31/2014  . HTN (hypertension) 05/31/2014  . DM (diabetes mellitus), type 2, uncontrolled ([place]) 05/31/2014    Past Surgical History:  Procedure Laterality Date  . ACHILLES TENDON SURGERY Left 02/08/2015   Procedure: ACHILLES LENGTHENING/KIDNER;  Surgeon: Albertine Patricia, DPM;  Location: [facility] ORS;  Service: Podiatry;  Laterality: Left;  . APPENDECTOMY    . CARDIAC CATHETERIZATION  08/25/13  . CATARACT EXTRACTION W/PHACO Left 07/20/2014   Procedure: CATARACT EXTRACTION PHACO AND INTRAOCULAR LENS PLACEMENT (IOC);  Surgeon: Leandrew Koyanagi, MD;  Location: [facility] ORS;  Service: Ophthalmology;  Laterality: Left;  [place]  1:18                 AP     23.6             CDE   9.69      lot #[phone]  . CHOLECYSTECTOMY    . CORONARY ANGIOPLASTY    . HALLUX VALGUS AKIN Left 02/08/2015   Procedure: HALLUX VALGUS AKIN/ KELLER;  Surgeon: Albertine Patricia, DPM;  Location: [facility] ORS;  Service: Podiatry;  Laterality: Left;  IVA with Local needs 1 hour for this case   . HEMIARTHROPLASTY HIP Right   . HEMIARTHROPLASTY HIP Left   .  INCISION AND DRAINAGE Left 10/11/2014   Procedure: Removal of infected tibial sessmoid;  Surgeon: Albertine Patricia, DPM;  Location: [facility] ORS;  Service: Podiatry;  Laterality: Left;  . IRRIGATION AND DEBRIDEMENT FOOT Right 03/21/2017   Procedure: IRRIGATION AND DEBRIDEMENT FOOT right great toe amputation;  Surgeon: Sharlotte Alamo, DPM;  Location: [facility] ORS;  Service: Podiatry;  Laterality: Right;  . IRRIGATION AND DEBRIDEMENT FOOT Right 04/18/2017   Procedure: IRRIGATION AND DEBRIDEMENT FOOT and application wound vac;  Surgeon: Albertine Patricia, DPM;  Location: [facility] ORS;  Service: Podiatry;  Laterality: Right;   . PERIPHERAL VASCULAR BALLOON ANGIOPLASTY Right 04/17/2017   Procedure: PERIPHERAL VASCULAR BALLOON ANGIOPLASTY;  Surgeon: Katha Cabal, MD;  Location: [place] [facility];  Service: Cardiovascular;  Laterality: Right;    Prior to Admission medications   Medication Sig Start Date End Date Taking? Authorizing Provider  acetaminophen (TYLENOL) 500 MG tablet Take 1,000 mg by mouth at bedtime. For pain management    [provider]  acetaminophen (TYLENOL) 650 MG CR tablet Take 650 mg by mouth every 8 (eight) hours as needed. For discomfort    [provider]  Amino Acids-Protein Hydrolys (FEEDING SUPPLEMENT, PRO-STAT SUGAR FREE 64,) LIQD Take 30 mLs by mouth 2 (two) times daily between meals.    [provider]  amiodarone (PACERONE) 200 MG tablet Take 1 tablet (200 mg total) by mouth daily. 03/24/17   Gladstone Lighter, MD  amitriptyline (ELAVIL) 10 MG tablet Take 10 mg by mouth daily.     [provider]  atorvastatin (LIPITOR) 10 MG tablet Take 10 mg by mouth daily at 10 pm.     [provider]  cholecalciferol (VITAMIN D) 1000 units tablet Take 1,000 Units by mouth daily.    [provider]  collagenase (SANTYL) ointment Apply topically daily. 03/12/18   Stark Jock Jude, MD  ferrous sulfate 325 (65 FE) MG tablet Take 325 mg by mouth daily.     [provider]  furosemide (LASIX) 20 MG tablet  03/15/18   [provider]  gabapentin (NEURONTIN) 100 MG capsule Take 2 capsules (200 mg total) by mouth 3 (three) times daily. 03/28/18 03/28/19  Rusty Aus, MD  glimepiride (AMARYL) 4 MG tablet Take 4 mg by mouth daily.     [provider]  GLUCERNA (GLUCERNA) LIQD Take 237 mLs by mouth 2 (two) times daily between meals.    [provider]  JANUVIA 50 MG tablet Take 50 mg by mouth daily. Take along with Amaryl 02/15/18   [provider]  levothyroxine (SYNTHROID, LEVOTHROID) 112 MCG tablet Take 112 mcg by  mouth daily before breakfast. 30 minutes before breakfast    [provider]  metoprolol tartrate (LOPRESSOR) 25 MG tablet Take 1 tablet (25 mg total) by mouth 2 (two) times daily. 04/21/17   Saundra Shelling, MD  mupirocin ointment (BACTROBAN) 2 % Place 1 application into the nose 2 (two) times daily.    [provider]  traMADol (ULTRAM) 50 MG tablet Take 1 tablet (50 mg total) by mouth every 6 (six) hours as needed for moderate pain. 03/27/18   Paulette Blanch, MD  traZODone (DESYREL) 50 MG tablet Take 50 mg by mouth at bedtime. For anxiety 06/24/17 06/24/18  [provider]  vitamin C (ASCORBIC ACID) 500 MG tablet Take 500 mg by mouth daily.     [provider]    Allergies  Allergen Reactions  . Carbamazepine Other (See Comments)    Reaction:  unknown  . Cymbalta [Duloxetine Hcl] Other (See Comments)    Confusion, disorientation  . Duloxetine     Other reaction(s): Other (See Comments) Confusion, disorientation  . Lyrica [Pregabalin] Other (See Comments)    Patient and son states this makes the patient confused.  . Temazepam     Other reaction(s): Hallucination    Family History  Problem Relation Age of Onset  . Heart attack Mother     Social History Social History   Tobacco Use  . Smoking status: Former Smoker    Packs/day: 0.50    Years: 7.00    Pack years: 3.50    Types: Cigarettes    Last attempt to quit: 05/30/1989    Years since quitting: 29.0  . Smokeless tobacco: Never Used  Substance Use Topics  . Alcohol use: No    Alcohol/week: 0.0 standard drinks  . Drug use: No    Review of Systems Constitutional: Negative for fever. Cardiovascular: Negative for chest pain. Respiratory: Negative for shortness of breath. Gastrointestinal: Negative for abdominal pain, vomiting Musculoskeletal: Back and buttock pain Skin: Negative for skin complaints  Neurological: Negative for headache All other ROS  negative  ____________________________________________   PHYSICAL EXAM:  Constitutional: Alert and oriented.  Frail/chronically ill-appearing, but no acute distress. Eyes: Normal exam ENT      Head: Normocephalic and atraumatic      Mouth/Throat: Mucous membranes are moist. Cardiovascular: Normal rate, regular rhythm.  Respiratory: Normal respiratory effort without tachypnea nor retractions. Breath sounds are clear Gastrointestinal: Soft and nontender. No distention.   Musculoskeletal: Able to move all extremities, discomfort when lying on her back, sacral ulcerations. Neurologic:  Normal speech and language.  Skin:  Skin is warm Psychiatric: Mood and affect are normal.   ____________________________________________    EKG  EKG viewed and interpreted by myself shows sinus bradycardia 45 bpm with a narrow QRS, normal axis, largely normal intervals.  No concerning ST changes.  ____________________________________________    RADIOLOGY  Ultrasound is negative.  ____________________________________________   INITIAL IMPRESSION / ASSESSMENT AND PLAN / ED COURSE  Pertinent labs & imaging results that were available during my care of the patient were reviewed by me and considered in my medical decision making (see chart for details).   Patient presents to the emergency department for low blood pressure at her nursing facility.  Found to have low blood pressure in the emergency department.  We will check labs, urinalysis, EKG, dose IV fluids and continue to closely monitor.  Patient agreeable to plan of care.  Patient's labs have resulted showing moderate elevation of her LFTs, ultrasound has shown no acute findings.  Patient's lab work also shows significant urinary tract infection this could likely be the source of the patient's hypotension earlier today.  Patient's blood pressure has improved after fluids currently 129/64.  However given the patient's hypotension with significant  urinary tract infection we will cover with IV antibiotics, send urine culture, and admit to the hospitalist service for further treatment.  Sharon [name] was evaluated in Emergency Department on 05/28/2018 for the symptoms described in the history of present illness. She was evaluated in the context of the global COVID-19 pandemic, which necessitated consideration that the patient might be at risk for infection with the SARS-CoV-2 virus that causes COVID-19. Institutional protocols and algorithms that pertain to the evaluation of patients at risk for COVID-19 are in a state of rapid change based on information released by regulatory bodies including the CDC and federal and  state organizations. These policies and algorithms were followed during the patient's care in the ED.  ____________________________________________   FINAL CLINICAL IMPRESSION(S) / ED DIAGNOSES  Hypotension Urinary tract infection   Harvest Dark, MD 05/28/18 1402

## 2018-05-28 NOTE — Progress Notes (Signed)
Anticoagulation monitoring(Lovenox):  85yo  female ordered Lovenox 40 mg Q24h for DVT prevention  Filed Weights   05/28/18 1025  Weight: 154 lb 5.2 oz (70 kg)   BMI 25   Lab Results  Component Value Date   CREATININE 1.71 (H) 05/28/2018   CREATININE 1.44 (H) 04/29/2018   CREATININE 1.52 (H) 04/25/2018   Estimated Creatinine Clearance: 24.9 mL/min (A) (by C-G formula based on SCr of 1.71 mg/dL (H)). Hemoglobin & Hematocrit     Component Value Date/Time   HGB 14.0 05/28/2018 1038   HGB 10.1 (L) 12/28/2013 0500   HCT 45.9 05/28/2018 1038   HCT 31.6 (L) 12/28/2013 0500     Per Protocol for Patient with estCrcl < 30 ml/min and BMI < 40, will transition to Lovenox 30 mg Q24h.     Paulina Fusi, PharmD, BCPS 05/28/2018 5:29 PM

## 2018-05-29 LAB — GLUCOSE, CAPILLARY
Glucose-Capillary: 108 mg/dL — ABNORMAL HIGH (ref 70–99)
Glucose-Capillary: 67 mg/dL — ABNORMAL LOW (ref 70–99)
Glucose-Capillary: 105 mg/dL — ABNORMAL HIGH (ref 70–99)
Glucose-Capillary: 136 mg/dL — ABNORMAL HIGH (ref 70–99)
Glucose-Capillary: 80 mg/dL (ref 70–99)

## 2018-05-29 LAB — BASIC METABOLIC PANEL
Anion gap: 7 (ref 5–15)
BUN: 21 mg/dL (ref 8–23)
CO2: 22 mmol/L (ref 22–32)
Calcium: 8 mg/dL — ABNORMAL LOW (ref 8.9–10.3)
Chloride: 110 mmol/L (ref 98–111)
Creatinine, Ser: 1.38 mg/dL — ABNORMAL HIGH (ref 0.44–1.00)
GFR calc Af Amer: 41 mL/min — ABNORMAL LOW (ref >60)
GFR calc non Af Amer: 36 mL/min — ABNORMAL LOW (ref >60)
Glucose, Bld: 80 mg/dL (ref 70–99)
Potassium: 4.1 mmol/L (ref 3.5–5.1)
Sodium: 139 mmol/L (ref 135–145)

## 2018-05-29 LAB — MRSA PCR SCREENING: MRSA by PCR: NEGATIVE

## 2018-05-29 MED ORDER — TRAZODONE HCL 50 MG PO TABS
50.0000 mg | ORAL_TABLET | Freq: Every evening | ORAL | Status: DC | PRN
  Administered 2018-05-29 – 2018-05-31 (×3): 50 mg via ORAL
  Filled 2018-05-29 (×3): qty 1

## 2018-05-29 NOTE — Progress Notes (Addendum)
Sharon, [name] ([id number]) Visit Report for 05/28/2018 Arrival Information Details Patient Name: Sharon [name], Sharon [name]. Date of Service: 05/28/2018 9:00 AM Medical Record Number: [id number] Patient Account Number: [id number] Date of Birth/Sex: 05-06-1933 (84 y.o. F) Treating RN: Harold Barban Primary Care Ryenn Howeth: Emily Filbert Other Clinician: Referring Shannon Kirkendall: Emily Filbert Treating Taivon Haroon/Extender: Melburn Hake, HOYT Weeks in Treatment: 9 Visit Information History Since Last Visit Added or deleted any medications: No Patient Arrived: Wheel Chair Any new allergies or adverse reactions: No Arrival Time: 09:25 Had a fall or experienced change in No Accompanied By: son activities of daily living that may affect Transfer Assistance: Harrel Lemon Lift risk of falls: Patient Identification Verified: Yes Hospitalized since last visit: No Secondary Verification Process Yes Has Dressing in Place as Prescribed: Yes Completed: Has Compression in Place as Prescribed: Yes Patient Has Alerts: Yes Pain Present Now: Yes Patient Alerts: ABI 06/29/17 L .94 R .89 Electronic Signature(s) Signed: 05/28/2018 4:17:13 PM By: Harold Barban Entered By: Harold Barban on 05/28/2018 09:36:58 Detjen, Hanaan P. ([id number]) -------------------------------------------------------------------------------- Clinic Level of Care Assessment Details Patient Name: [name], [name] Date of Service: 05/28/2018 9:00 AM Medical Record Number: [id number] Patient Account Number: [id number] Date of Birth/Sex: 12-Nov-1933 (84 y.o. F) Treating RN: Montey Hora Primary Care Willmar Stockinger: Emily Filbert Other Clinician: Referring Elzina Devera: Emily Filbert Treating Omarri Eich/Extender: Melburn Hake, HOYT Weeks in Treatment: 9 Clinic Level of Care Assessment Items TOOL 4 Quantity Score []  - Use when only an EandM is performed on FOLLOW-UP visit 0 ASSESSMENTS - Nursing Assessment / Reassessment X - Reassessment of Co-morbidities (includes  updates in patient status) 1 10 []  - 0 Reassessment of Adherence to Treatment Plan ASSESSMENTS - Wound and Skin Assessment / Reassessment []  - Simple Wound Assessment / Reassessment - one wound 0 []  - 0 Complex Wound Assessment / Reassessment - multiple wounds []  - 0 Dermatologic / Skin Assessment (not related to wound area) ASSESSMENTS - Focused Assessment []  - Circumferential Edema Measurements - multi extremities 0 []  - 0 Nutritional Assessment / Counseling / Intervention []  - 0 Lower Extremity Assessment (monofilament, tuning fork, pulses) []  - 0 Peripheral Arterial Disease Assessment (using hand held doppler) ASSESSMENTS - Ostomy and/or Continence Assessment and Care []  - Incontinence Assessment and Management 0 []  - 0 Ostomy Care Assessment and Management (repouching, etc.) PROCESS - Coordination of Care []  - Simple Patient / Family Education for ongoing care 0 []  - 0 Complex (extensive) Patient / Family Education for ongoing care X- 1 10 Staff obtains Programmer, systems, Records, Test Results / Process Orders []  - 0 Staff telephones HHA, Nursing Homes / Clarify orders / etc []  - 0 Routine Transfer to another Facility (non-emergent condition) []  - 0 Routine Hospital Admission (non-emergent condition) []  - 0 New Admissions / Biomedical engineer / Ordering NPWT, Apligraf, etc. X- 1 20 Emergency Hospital Admission (emergent condition) []  - 0 Simple Discharge Coordination Muhammed, Laiana P. ([id number]) []  - 0 Complex (extensive) Discharge Coordination PROCESS - Special Needs []  - Pediatric / Minor Patient Management 0 []  - 0 Isolation Patient Management []  - 0 Hearing / Language / Visual special needs []  - 0 Assessment of Community assistance (transportation, D/C planning, etc.) []  - 0 Additional assistance / Altered mentation []  - 0 Support Surface(s) Assessment (bed, cushion, seat, etc.) INTERVENTIONS - Wound Cleansing / Measurement []  - Simple Wound Cleansing -  one wound 0 []  - 0 Complex Wound Cleansing - multiple wounds []  - 0 Wound Imaging (photographs - any number of wounds) []  - 0 Wound  Tracing (instead of photographs) []  - 0 Simple Wound Measurement - one wound []  - 0 Complex Wound Measurement - multiple wounds INTERVENTIONS - Wound Dressings []  - Small Wound Dressing one or multiple wounds 0 []  - 0 Medium Wound Dressing one or multiple wounds []  - 0 Large Wound Dressing one or multiple wounds []  - 0 Application of Medications - topical []  - 0 Application of Medications - injection INTERVENTIONS - Miscellaneous []  - External ear exam 0 []  - 0 Specimen Collection (cultures, biopsies, blood, body fluids, etc.) []  - 0 Specimen(s) / Culture(s) sent or taken to Lab for analysis []  - 0 Patient Transfer (multiple staff / Civil Service fast streamer / Similar devices) []  - 0 Simple Staple / Suture removal (25 or less) []  - 0 Complex Staple / Suture removal (26 or more) X- 1 10 Hypo / Hyperglycemic Management (close monitor of Blood Glucose) []  - 0 Ankle / Brachial Index (ABI) - do not check if billed separately X- 1 5 Vital Signs Dimmitt, Esra P. ([id number]) Has the patient been seen at the hospital within the last three years: Yes Total Score: 55 Level Of Care: New/Established - Level 2 Electronic Signature(s) Signed: 05/28/2018 4:26:01 PM By: Montey Hora Entered By: Montey Hora on 05/28/2018 10:09:42 Tuohy, Lundynn Mamie Nick ([id number]) -------------------------------------------------------------------------------- Encounter Discharge Information Details Patient Name: [name], [name] Date of Service: 05/28/2018 9:00 AM Medical Record Number: [id number] Patient Account Number: [id number] Date of Birth/Sex: Sep 27, 1933 (84 y.o. F) Treating RN: Montey Hora Primary Care Amarisa Wilinski: Emily Filbert Other Clinician: Referring Ahmaya Ostermiller: Emily Filbert Treating Nawaf Strange/Extender: Melburn Hake, HOYT Weeks in Treatment: 9 Encounter Discharge  Information Items Discharge Condition: Unstable Ambulatory Status: Stretcher Discharge Destination: Emergency Room Telephoned: Yes triage nurse - Margarita Grizzle spoke Spoke With: with Orders Sent: No Transportation: Ambulance Accompanied By: EMS and son Schedule Follow-up Yes Appointment: Clinical Summary of Care: Electronic Signature(s) Signed: 05/28/2018 10:10:24 AM By: Montey Hora Entered By: Montey Hora on 05/28/2018 10:10:23 Reindl, [place] ([id number]) -------------------------------------------------------------------------------- [place] Details Patient Name: Brandenburg, Channel P. Date of Service: 05/28/2018 9:00 AM Medical Record Number: [id number] Patient Account Number: [id number] Date of Birth/Sex: 1933/10/15 (84 y.o. F) Treating RN: Cornell Barman Primary Care Yoltzin Ransom: Emily Filbert Other Clinician: Referring Daryan Cagley: Emily Filbert Treating Octavia Mottola/Extender: Melburn Hake, HOYT Weeks in Treatment: 9 Active Inactive Electronic Signature(s) Signed: 07/19/2018 1:50:41 PM By: Gretta Cool, BSN, RN, CWS, Kim RN, BSN Entered By: Gretta Cool, BSN, RN, CWS, Kim on 07/19/2018 13:50:40 Chivers, [place] ([id number]) -------------------------------------------------------------------------------- Pain Assessment Details Patient Name: Underhill, Clytie P. Date of Service: 05/28/2018 9:00 AM Medical Record Number: [id number] Patient Account Number: [id number] Date of Birth/Sex: January 24, 1933 (84 y.o. F) Treating RN: Harold Barban Primary Care Karman Veney: Emily Filbert Other Clinician: Referring Shenekia Riess: Emily Filbert Treating Esmeralda Malay/Extender: Melburn Hake, HOYT Weeks in Treatment: 9 Active Problems Location of Pain Severity and Description of Pain Patient Has Paino Yes Site Locations Pain Management and Medication Current Pain Management: Goals for Pain Management patient reports hurting all over especially in her back Electronic Signature(s) Signed: 05/28/2018 4:17:13 PM By: Harold Barban Entered By: Harold Barban on 05/28/2018 09:37:28 Santucci, Murna P. ([id number]) -------------------------------------------------------------------------------- Vitals Details Patient Name: Caven, Dorine P. Date of Service: 05/28/2018 9:00 AM Medical Record Number: [id number] Patient Account Number: [id number] Date of Birth/Sex: 1933/05/31 (84 y.o. F) Treating RN: Harold Barban Primary Care Kelton Bultman: Emily Filbert Other Clinician: Referring Baleria Wyman: Emily Filbert Treating Jayvion Stefanski/Extender: Melburn Hake, HOYT Weeks in Treatment: 9 Vital Signs Time Taken: 09:37 Temperature (F): 97.6 Height (in): 65 Pulse (bpm):  44 Weight (lbs): 155 Respiratory Rate (breaths/min): 22 Body Mass Index (BMI): 25.8 Blood Pressure (mmHg): 60/32 Capillary Blood Glucose (mg/dl): 150 Reference Range: 80 - 120 mg / dl Airway Pulse Oximetry (%): 96 Notes [place]t notified and patient is going to ED via EMS Electronic Signature(s) Signed: 05/28/2018 4:17:13 PM By: Harold Barban Entered By: Harold Barban on 05/28/2018 09:41:30

## 2018-05-29 NOTE — Plan of Care (Signed)
  Problem: Clinical Measurements: Goal: Ability to maintain clinical measurements within normal limits will improve Outcome: Progressing Goal: Diagnostic test results will improve Outcome: Progressing Goal: Cardiovascular complication will be avoided Outcome: Progressing   Problem: Coping: Goal: Level of anxiety will decrease Outcome: Progressing   Problem: Elimination: Goal: Will not experience complications related to bowel motility Outcome: Progressing   Problem: Pain Managment: Goal: General experience of comfort will improve Outcome: Progressing   Problem: Safety: Goal: Ability to remain free from injury will improve Outcome: Progressing   Problem: Skin Integrity: Goal: Risk for impaired skin integrity will decrease Outcome: Progressing

## 2018-05-29 NOTE — TOC Initial Note (Signed)
Transition of Care [facility]) - Initial/Assessment Note    Patient Details  Name: Sharon [name] MRN: [id number] Date of Birth: 1933-04-19  [facility] CM/SW Contact:    Latanya Maudlin, RN Phone Number: 05/29/2018, 1:38 PM  Clinical Narrative:   [facility] team consulted to complete high risk assessment. Patient is currently from home alone. She reports that she still drives. Uses a rolling walker and wheelchair at home. Patient does report several falls and that's what she says prompted her admission. Patient had been a resident of [facility] and has since moved to a home. Patient tells me she was in the process of moving back to [place] but there have been some barriers related to COVID 19. PCP is Emily Filbert. Patient [place] and obtains medications without issue.              Expected Discharge Plan: Home/Self Care Barriers to Discharge: Continued Medical Work up   Patient Goals and CMS Choice   CMS Medicare.gov Compare Post Acute Care list provided to:: Patient Choice offered to / list presented to : Patient  Expected Discharge Plan and Services Expected Discharge Plan: Home/Self Care   Discharge Planning Services: CM Consult   Living arrangements for the past 2 months: Single Family Home                                      Prior Living Arrangements/Services Living arrangements for the past 2 months: Single Family Home Lives with:: Self Patient language and need for interpreter reviewed:: No Do you feel safe going back to the place where you live?: Yes      Need for Family Participation in Patient Care: No (Comment)     Criminal Activity/Legal Involvement Pertinent to Current Situation/Hospitalization: No - Comment as needed  Activities of Daily Living Home Assistive Devices/Equipment: Walker (specify type) ADL Screening (condition at time of admission) Patient's cognitive ability adequate to safely complete daily activities?:  Yes Is the patient deaf or have difficulty hearing?: No Does the patient have difficulty seeing, even when wearing glasses/contacts?: No Does the patient have difficulty concentrating, remembering, or making decisions?: Yes Patient able to express need for assistance with ADLs?: Yes Does the patient have difficulty dressing or bathing?: Yes Independently performs ADLs?: No Communication: Independent Dressing (OT): Needs assistance Is this a change from baseline?: Pre-admission baseline Grooming: Needs assistance Is this a change from baseline?: Pre-admission baseline Feeding: Independent Bathing: Needs assistance Is this a change from baseline?: Pre-admission baseline Toileting: Needs assistance Is this a change from baseline?: Pre-admission baseline In/Out Bed: Needs assistance Is this a change from baseline?: Pre-admission baseline Walks in Home: Needs assistance Is this a change from baseline?: Pre-admission baseline Does the patient have difficulty walking or climbing stairs?: Yes Weakness of Legs: Both Weakness of Arms/Hands: Both  Permission Sought/Granted Permission sought to share information with : Case Manager                Emotional Assessment Appearance:: Appears stated age Attitude/Demeanor/Rapport: Gracious, Engaged Affect (typically observed): Accepting Orientation: : Oriented to Self, Oriented to Place, Oriented to  Time, Oriented to Situation      Admission diagnosis:  Hypotension, unspecified hypotension type [I95.9] Urinary tract infection without hematuria, site unspecified [N39.0] Patient Active Problem List   Diagnosis Date Noted  . Hypotension 05/28/2018  . Lymphedema 03/24/2018  . Pressure injury of skin  03/12/2018  . Atherosclerosis of native arteries of the extremities with ulceration ([place]) 05/07/2017  . Non-healing ulcer ([place]) 04/16/2017  . Diabetic neuropathy ([place]) 04/07/2017  . Septic shock ([place]) 03/20/2017  . CKD (chronic kidney  disease), stage III ([place]) 03/19/2017  . Goals of care, counseling/discussion 03/03/2017  . PAD (peripheral artery disease) ([place]) 02/03/2017  . Abdominal aortic stenosis 02/03/2017  . Bilateral lower extremity edema 02/03/2017  . Anemia 01/29/2017  . B12 deficiency 01/29/2017  . Pancreatic mass 01/29/2017  . Hyponatremia 07/07/2015  . Foot ulcer ([place]) 02/07/2015  . Acute on chronic renal failure ([place]) 11/05/2014  . Diabetic foot infection ([place]) 10/10/2014  . Chronic diastolic heart failure ([place]) 05/31/2014  . HTN (hypertension) 05/31/2014  . DM (diabetes mellitus), type 2, uncontrolled ([place]) 05/31/2014   PCP:  Rusty Aus, MD Pharmacy:   [place], [place] - [address] [place] [address] Phone: [phone] Fax: [place], [place] [address] [place] [address] Phone: [phone] Fax: [place], [place] - [address] [place] [address] Phone: [phone] Fax: [phone]     Social Determinants of Health (SDOH) Interventions    Readmission Risk Interventions Readmission Risk Prevention Plan 05/29/2018  Transportation Screening Complete  Medication Review (RN Care Manager) Complete  [place] Not Applicable  Some recent data might be hidden

## 2018-05-29 NOTE — Progress Notes (Signed)
[employer] [facility] [place] NAME: Sharon [name]    MR#:  [id number]  DATE OF BIRTH:  Feb 18, 1933  SUBJECTIVE:  CHIEF COMPLAINT:   Chief Complaint  Patient presents with  . Hypotension   No new complaint this morning.  No fevers.  Resting comfortably. REVIEW OF SYSTEMS:  Review of Systems  Constitutional: Negative for chills and fever.  HENT: Negative for hearing loss and tinnitus.   Eyes: Negative for blurred vision and double vision.  Respiratory: Negative for cough and hemoptysis.   Cardiovascular: Negative for chest pain and palpitations.  Gastrointestinal: Negative for heartburn and nausea.  Genitourinary: Negative for dysuria.  Musculoskeletal: Negative for myalgias and neck pain.  Skin: Negative for itching and rash.  Neurological: Negative for dizziness and headaches.  Psychiatric/Behavioral: Negative for depression and hallucinations.    DRUG ALLERGIES:   Allergies  Allergen Reactions  . Carbamazepine Other (See Comments)    Reaction: unknown  . Cymbalta [Duloxetine Hcl] Other (See Comments)    Confusion, disorientation  . Duloxetine     Other reaction(s): Other (See Comments) Confusion, disorientation  . Lyrica [Pregabalin] Other (See Comments)    Patient and son states this makes the patient confused.  . Temazepam     Other reaction(s): Hallucination   VITALS:  Blood pressure (!) 101/54, pulse (!) 51, temperature 97.7 F (36.5 C), resp. rate 18, height 5\' 5"  (1.651 m), weight 70.8 kg, SpO2 98 %. PHYSICAL EXAMINATION:   GENERAL:  85 y.o.-year-old patient lying in the bed with no acute distress.  Cachectic/frail appearing EYES: Pupils equal, round, reactive to light and accommodation. No scleral icterus. Extraocular muscles intact.  HEENT: Head atraumatic, normocephalic. Oropharynx and nasopharynx clear.  NECK:  Supple, no jugular venous distention. No thyroid enlargement, no tenderness.  LUNGS: Normal breath sounds  bilaterally, no wheezing, rales,rhonchi or crepitation. No use of accessory muscles of respiration.  CARDIOVASCULAR: S1, S2 normal. No murmurs, rubs, or gallops.  ABDOMEN: Soft, nontender, nondistended. Bowel sounds present. No organomegaly or mass.  EXTREMITIES: No pedal edema, cyanosis, or clubbing. Bilateral lower extremity leg dressings recently placed at wound care clinic  NEUROLOGIC: Cranial nerves II through XII are intact. Muscle strength 5/5 in all extremities. Sensation intact. Gait not checked.  PSYCHIATRIC: The patient is alert and oriented x 3.  SKIN: No obvious rash, lesion, or ulcer.   LABORATORY PANEL:  Female CBC Recent Labs  Lab 05/28/18 1038  WBC 9.3  HGB 14.0  HCT 45.9  PLT 303   ------------------------------------------------------------------------------------------------------------------ Chemistries  Recent Labs  Lab 05/28/18 1038 05/29/18 0515  NA 134* 139  K 5.2* 4.1  CL 104 110  CO2 18* 22  GLUCOSE 175* 80  BUN 25* 21  CREATININE 1.71* 1.38*  CALCIUM 8.3* 8.0*  AST 170*  --   ALT 55*  --   ALKPHOS 357*  --   BILITOT 0.7  --    RADIOLOGY:  No results found. ASSESSMENT AND PLAN:   *Acute severe hypotension Blood pressure was initially in the 60s in the emergency room.  This was also thought to be medication related given patient being started on trazodone recently in addition to being on Percocet.  Appears to have responded well to IV fluids.Blood pressure improved with recent blood pressure 101/54. Gentle IV fluid hydration.  *Acute probable urinary tract infection Patient on empiric antibiotics with Rocephin pending clinical course and culture results.  *Acute on chronic pain syndrome Noted history of L1 fracture Stable  Pain protocol provided blood pressure stable  *Chronic diabetes mellitus type 2 Continue home regiment, carbohydrate consistent diet, sliding scale insulin with Accu-Cheks per routine  *Chronic bilateral lower  extremity leg wounds Continue local wound care, wound care nurse consulted for expert opinion  *Chronic adult failure to thrive/malnutrition Dietary consulted  *History of atrial fibrillation Stable Continue amiodarone, Lopressor on hold given hypotension, not on oral anticoagulation  *Chronic hypothyroidism, unspecified Continue Synthroid, check TSH  DVT prophylaxis-Lovenox   All the records are reviewed and case discussed with Care Management/Social Worker. Management plans discussed with the patient, family and they are in agreement.  CODE STATUS: Full Code  TOTAL TIME TAKING CARE OF THIS PATIENT: 35 minutes.   More than 50% of the time was spent in counseling/coordination of care: YES  POSSIBLE D/C IN 2 DAYS, DEPENDING ON CLINICAL CONDITION.   Haleem Hanner M.D on 05/29/2018 at 2:28 PM  Between 7am to 6pm - Pager - [phone]  After 6pm go to [email or web address] - Proofreader  [facility]  Office  [phone]  CC: Primary care physician; Rusty Aus, MD  Note: This dictation was prepared with Dragon dictation along with smaller phrase technology. Any transcriptional errors that result from this process are unintentional.

## 2018-05-30 LAB — CBC
HCT: 36.4 % (ref 36.0–46.0)
Hemoglobin: 11.2 g/dL — ABNORMAL LOW (ref 12.0–15.0)
MCH: 27.5 pg (ref 26.0–34.0)
MCHC: 30.8 g/dL (ref 30.0–36.0)
MCV: 89.4 fL (ref 80.0–100.0)
Platelets: 222 10*3/uL (ref 150–400)
RBC: 4.07 MIL/uL (ref 3.87–5.11)
RDW: 15.3 % (ref 11.5–15.5)
WBC: 7.2 10*3/uL (ref 4.0–10.5)
nRBC: 0 % (ref 0.0–0.2)

## 2018-05-30 LAB — BASIC METABOLIC PANEL
Anion gap: 7 (ref 5–15)
BUN: 13 mg/dL (ref 8–23)
CO2: 21 mmol/L — ABNORMAL LOW (ref 22–32)
Calcium: 7.9 mg/dL — ABNORMAL LOW (ref 8.9–10.3)
Chloride: 112 mmol/L — ABNORMAL HIGH (ref 98–111)
Creatinine, Ser: 0.93 mg/dL (ref 0.44–1.00)
GFR calc Af Amer: >60 mL/min (ref >60)
GFR calc non Af Amer: 57 mL/min — ABNORMAL LOW (ref >60)
Glucose, Bld: 86 mg/dL (ref 70–99)
Potassium: 3.4 mmol/L — ABNORMAL LOW (ref 3.5–5.1)
Sodium: 140 mmol/L (ref 135–145)

## 2018-05-30 LAB — TSH: TSH: 0.289 u[IU]/mL — ABNORMAL LOW (ref 0.350–4.500)

## 2018-05-30 LAB — GLUCOSE, CAPILLARY
Glucose-Capillary: 80 mg/dL (ref 70–99)
Glucose-Capillary: 102 mg/dL — ABNORMAL HIGH (ref 70–99)
Glucose-Capillary: 136 mg/dL — ABNORMAL HIGH (ref 70–99)
Glucose-Capillary: 107 mg/dL — ABNORMAL HIGH (ref 70–99)

## 2018-05-30 LAB — MAGNESIUM: Magnesium: 1.8 mg/dL (ref 1.7–2.4)

## 2018-05-30 MED ORDER — POTASSIUM CHLORIDE CRYS ER 20 MEQ PO TBCR
40.0000 meq | EXTENDED_RELEASE_TABLET | Freq: Once | ORAL | Status: AC
  Administered 2018-05-30: 40 meq via ORAL

## 2018-05-30 MED ORDER — ENOXAPARIN SODIUM 40 MG/0.4ML ~~LOC~~ SOLN
40.0000 mg | SUBCUTANEOUS | Status: DC
  Administered 2018-05-30 – 2018-05-31 (×2): 40 mg via SUBCUTANEOUS
  Filled 2018-05-30 (×2): qty 0.4

## 2018-05-30 MED ORDER — NITROGLYCERIN 2 % TD OINT
0.5000 [in_us] | TOPICAL_OINTMENT | Freq: Four times a day (QID) | TRANSDERMAL | Status: DC
  Administered 2018-05-30 – 2018-06-04 (×12): 0.5 [in_us] via TOPICAL
  Filled 2018-05-30 (×13): qty 1

## 2018-05-30 MED ORDER — METOPROLOL TARTRATE 25 MG PO TABS
25.0000 mg | ORAL_TABLET | Freq: Two times a day (BID) | ORAL | Status: DC
  Administered 2018-05-30 – 2018-06-04 (×5): 25 mg via ORAL
  Filled 2018-05-30 (×11): qty 1

## 2018-05-30 NOTE — Progress Notes (Signed)
PHARMACIST - PHYSICIAN COMMUNICATION  CONCERNING:  Enoxaparin (Lovenox) for DVT Prophylaxis    RECOMMENDATION: Patient was prescribed enoxaprin 30mg  q24 hours for VTE prophylaxis.   Filed Weights   05/28/18 1025 05/29/18 0445 05/30/18 0500  Weight: 154 lb 5.2 oz (70 kg) 156 lb 2.8 oz (70.8 kg) 130 lb 11.2 oz (59.3 kg)    Body mass index is 21.75 kg/m.  Estimated Creatinine Clearance: 42 mL/min (by C-G formula based on SCr of 0.93 mg/dL).   Based on [place] patient is candidate for enoxaparin 40mg  every 24 hour dosing due to CrCl >41ml/min and weight > 45kg   DESCRIPTION: Pharmacy has adjusted enoxaparin dose per [facility] policy.  Patient is now receiving enoxaparin 40mg  every 24 hours per improving renal function    Lu Duffel, PharmD, BCPS Clinical Pharmacist 05/30/2018 2:16 PM

## 2018-05-30 NOTE — Progress Notes (Signed)
[facility] NAME: Sharon [name]    MR#:  [id number]  DATE OF BIRTH:  [birth year]  SUBJECTIVE:  CHIEF COMPLAINT:   Chief Complaint  Patient presents with  . Hypotension   No new complaint this morning.  No fevers.  Resting comfortably. REVIEW OF SYSTEMS:  Review of Systems  Constitutional: Negative for chills and fever.  HENT: Negative for hearing loss and tinnitus.   Eyes: Negative for blurred vision and double vision.  Respiratory: Negative for cough and hemoptysis.   Cardiovascular: Negative for chest pain and palpitations.  Gastrointestinal: Negative for heartburn and nausea.  Genitourinary: Negative for dysuria.  Musculoskeletal: Negative for myalgias and neck pain.  Skin: Negative for itching and rash.  Neurological: Negative for dizziness and headaches.  Psychiatric/Behavioral: Negative for depression and hallucinations.    DRUG ALLERGIES:   Allergies  Allergen Reactions  . Carbamazepine Other (See Comments)    Reaction: unknown  . Cymbalta [Duloxetine Hcl] Other (See Comments)    Confusion, disorientation  . Duloxetine     Other reaction(s): Other (See Comments) Confusion, disorientation  . Lyrica [Pregabalin] Other (See Comments)    Patient and son states this makes the patient confused.  . Temazepam     Other reaction(s): Hallucination   VITALS:  Blood pressure (!) 152/84, pulse (!) 56, temperature 97.6 F (36.4 C), temperature source Oral, resp. rate 18, height 5\' 5"  (1.651 m), weight 59.3 kg, SpO2 96 %. PHYSICAL EXAMINATION:   GENERAL:  85 y.o.-year-old patient lying in the bed with no acute distress.  Cachectic/frail appearing EYES: Pupils equal, round, reactive to light and accommodation. No scleral icterus. Extraocular muscles intact.  HEENT: Head atraumatic, normocephalic. Oropharynx and nasopharynx clear.  NECK:  Supple, no jugular venous distention. No thyroid enlargement, no tenderness.   LUNGS: Normal breath sounds bilaterally, no wheezing, rales,rhonchi or crepitation. No use of accessory muscles of respiration.  CARDIOVASCULAR: S1, S2 normal. No murmurs, rubs, or gallops.  ABDOMEN: Soft, nontender, nondistended. Bowel sounds present. No organomegaly or mass.  EXTREMITIES: No pedal edema, cyanosis, or clubbing. Bilateral lower extremity leg dressings recently placed at wound care clinic  NEUROLOGIC: Cranial nerves II through XII are intact. Muscle strength 5/5 in all extremities. Sensation intact. Gait not checked.  PSYCHIATRIC: The patient is alert and oriented x 3.  SKIN: No obvious rash, lesion, or ulcer.   LABORATORY PANEL:  Female CBC Recent Labs  Lab 05/30/18 0439  WBC 7.2  HGB 11.2*  HCT 36.4  PLT 222   ------------------------------------------------------------------------------------------------------------------ Chemistries  Recent Labs  Lab 05/28/18 1038  05/30/18 0439  NA 134*   < > 140  K 5.2*   < > 3.4*  CL 104   < > 112*  CO2 18*   < > 21*  GLUCOSE 175*   < > 86  BUN 25*   < > 13  CREATININE 1.71*   < > 0.93  CALCIUM 8.3*   < > 7.9*  MG  --   --  1.8  AST 170*  --   --   ALT 55*  --   --   ALKPHOS 357*  --   --   BILITOT 0.7  --   --    < > = values in this interval not displayed.   RADIOLOGY:  No results found. ASSESSMENT AND PLAN:   *Acute severe hypotension Blood pressure was initially in the 60s in the emergency room.  This was also  thought to be medication related given patient being started on trazodone recently in addition to being on Percocet.  Appears to have responded well to IV fluids.Blood pressure improved with recent blood pressure 154/84.  Discontinued IV fluids  *Acute probable urinary tract infection Patient on empiric antibiotics with Rocephin pending clinical course and culture results.  *Acute on chronic pain syndrome Noted history of L1 fracture Stable Pain protocol provided blood pressure stable  *Chronic  diabetes mellitus type 2 Continue home regiment, carbohydrate consistent diet, sliding scale insulin with Accu-Cheks per routine  *Chronic bilateral lower extremity leg wounds Continue local wound care, wound care nurse consulted for expert opinion  *Chronic adult failure to thrive/malnutrition Dietary consulted  *History of atrial fibrillation Stable Continue amiodarone, Lopressor, not on oral anticoagulation  *Chronic hypothyroidism, unspecified Continue Synthroid.  TSH normal.  DVT prophylaxis-Lovenox   All the records are reviewed and case discussed with Care Management/Social Worker. Management plans discussed with the patient, family and they are in agreement.  CODE STATUS: Full Code  TOTAL TIME TAKING CARE OF THIS PATIENT: 26 minutes.   More than 50% of the time was spent in counseling/coordination of care: YES  POSSIBLE D/C IN 1DAYS, DEPENDING ON CLINICAL CONDITION.   Lind Ausley M.D on 05/30/2018 at 1:52 PM  Between 7am to 6pm - Pager - [phone]  After 6pm go to [email or web address] - Proofreader  [facility]  Office  [phone]  CC: Primary care physician; Rusty Aus, MD  Note: This dictation was prepared with Dragon dictation along with smaller phrase technology. Any transcriptional errors that result from this process are unintentional.

## 2018-05-31 DIAGNOSIS — Z8631 Personal history of diabetic foot ulcer: Secondary | ICD-10-CM

## 2018-05-31 DIAGNOSIS — R945 Abnormal results of liver function studies: Secondary | ICD-10-CM

## 2018-05-31 DIAGNOSIS — N179 Acute kidney failure, unspecified: Secondary | ICD-10-CM

## 2018-05-31 DIAGNOSIS — R8271 Bacteriuria: Secondary | ICD-10-CM

## 2018-05-31 DIAGNOSIS — N189 Chronic kidney disease, unspecified: Secondary | ICD-10-CM

## 2018-05-31 DIAGNOSIS — Z888 Allergy status to other drugs, medicaments and biological substances status: Secondary | ICD-10-CM

## 2018-05-31 DIAGNOSIS — E1122 Type 2 diabetes mellitus with diabetic chronic kidney disease: Secondary | ICD-10-CM

## 2018-05-31 DIAGNOSIS — Z87891 Personal history of nicotine dependence: Secondary | ICD-10-CM

## 2018-05-31 DIAGNOSIS — I959 Hypotension, unspecified: Principal | ICD-10-CM

## 2018-05-31 DIAGNOSIS — D509 Iron deficiency anemia, unspecified: Secondary | ICD-10-CM

## 2018-05-31 DIAGNOSIS — L89152 Pressure ulcer of sacral region, stage 2: Secondary | ICD-10-CM

## 2018-05-31 DIAGNOSIS — M4856XD Collapsed vertebra, not elsewhere classified, lumbar region, subsequent encounter for fracture with routine healing: Secondary | ICD-10-CM

## 2018-05-31 LAB — GLUCOSE, CAPILLARY
Glucose-Capillary: 105 mg/dL — ABNORMAL HIGH (ref 70–99)
Glucose-Capillary: 114 mg/dL — ABNORMAL HIGH (ref 70–99)
Glucose-Capillary: 115 mg/dL — ABNORMAL HIGH (ref 70–99)
Glucose-Capillary: 109 mg/dL — ABNORMAL HIGH (ref 70–99)

## 2018-05-31 LAB — BASIC METABOLIC PANEL
Anion gap: 6 (ref 5–15)
BUN: 8 mg/dL (ref 8–23)
CO2: 22 mmol/L (ref 22–32)
Calcium: 8.1 mg/dL — ABNORMAL LOW (ref 8.9–10.3)
Chloride: 111 mmol/L (ref 98–111)
Creatinine, Ser: 0.92 mg/dL (ref 0.44–1.00)
GFR calc Af Amer: >60 mL/min (ref >60)
GFR calc non Af Amer: 58 mL/min — ABNORMAL LOW (ref >60)
Glucose, Bld: 95 mg/dL (ref 70–99)
Potassium: 3.4 mmol/L — ABNORMAL LOW (ref 3.5–5.1)
Sodium: 139 mmol/L (ref 135–145)

## 2018-05-31 LAB — URINE CULTURE: Culture: >=100000 — AB

## 2018-05-31 MED ORDER — FUROSEMIDE 20 MG PO TABS
20.0000 mg | ORAL_TABLET | Freq: Every day | ORAL | Status: DC
  Administered 2018-05-31 – 2018-06-04 (×4): 20 mg via ORAL
  Filled 2018-05-31 (×5): qty 1

## 2018-05-31 MED ORDER — MUPIROCIN 2 % EX OINT
1.0000 "application " | TOPICAL_OINTMENT | Freq: Two times a day (BID) | CUTANEOUS | Status: DC | PRN
  Administered 2018-06-01 – 2018-06-03 (×2): 1 via NASAL
  Filled 2018-05-31: qty 22

## 2018-05-31 MED ORDER — NEPRO/CARBSTEADY PO LIQD: 237.0000 mL | Freq: Two times a day (BID) | ORAL | Status: DC

## 2018-05-31 MED ORDER — POTASSIUM CHLORIDE CRYS ER 20 MEQ PO TBCR
40.0000 meq | EXTENDED_RELEASE_TABLET | Freq: Once | ORAL | Status: AC
  Administered 2018-05-31: 40 meq via ORAL
  Filled 2018-05-31: qty 2

## 2018-05-31 NOTE — Progress Notes (Signed)
Patient continues to be awake and yelling out. Patient easily reoriented. Patient throwing covers off the bed. Patient requested to be taken outside. Patient again reoriented to her location and time. She verbalized understanding. Patient awake resting in bed when this Rn left the room.

## 2018-05-31 NOTE — Care Management Important Message (Signed)
Important Message  Patient Details  Name: ADESSA PRIMIANO MRN: [id number] Date of Birth: [birth year]   Medicare Important Message Given:  Yes    Elza Rafter, RN 05/31/2018, 10:44 AM

## 2018-05-31 NOTE — Progress Notes (Signed)
Nutrition Follow Up Note   DOCUMENTATION CODES:   Not applicable  INTERVENTION:   Nepro Shake po BID, each supplement provides 425 kcal and 19 grams protein  Magic cup TID with meals, each supplement provides 290 kcal and 9 grams of protein  MVI daily   Vitamin C '250mg'$  po BID  Liberal diet   NUTRITION DIAGNOSIS:   Inadequate oral intake related to acute illness as evidenced by other (comment)(per chart review ).  GOAL:   Patient will meet greater than or equal to 90% of their needs  -not met   MONITOR:   PO intake, Supplement acceptance, Labs, Weight trends, I & O's, Skin  ASSESSMENT:   85 y/o female with h/o DM, CHF, Stroke, hip fx admitted with hypotension   RD working remotely.  Pt with continued poor appetite and oral intake; pt eating <25% of meals. Pt refusing all supplements; pt ordered for prostat, Ensure and Glucerna. RD will discontinue supplements and add Nepro as this comes in mixed berry and butter pecan flavors. RD will also add Magic Cups to meals trays. Per RN note, pt disoriented today and yelling out.   Medications reviewed: dulcolax, vitamin D, lovenox, ferrous lasix, insulin, synthroid, MVI, KCl, simethicone, vitamin C  Labs reviewed: K 3.4(L), Mg 1.8 wnl  Diet Order:   Diet Order            Diet regular Room service appropriate? Yes; Fluid consistency: Thin; Fluid restriction: 1200 mL Fluid  Diet effective now             EDUCATION NEEDS:   Not appropriate for education at this time  Skin:  Skin Assessment: Reviewed RN Assessment(1 cm round nonintact lesion to left anterior lower leg.)  Last BM:  5/17- type 7  Height:   Ht Readings from Last 1 Encounters:  05/28/18 '5\' 5"'$  (1.651 m)    Weight:   Wt Readings from Last 1 Encounters:  05/31/18 58.7 kg    Ideal Body Weight:  56.8 kg  BMI:  Body mass index is 21.52 kg/m.  Estimated Nutritional Needs:   Kcal:  1400-1600kcal/day   Protein:  70-80g/day   Fluid:  >1.4L/day    Sharon Distance MS, RD, LDN Pager #- [phone] Office#- [phone] After Hours Pager: [phone]

## 2018-05-31 NOTE — Consult Note (Signed)
[facility] Nurse wound consult note Reason for Consult: Bilateral unna boots.  Changed weekly on Friday.  Wound type: 1 cm round nonintact lesion to left anterior lower leg.  Pressure Injury POA: N/A Measurement: see above Wound YJE:HUDJS red Drainage (amount, consistency, odor) minimal serosanguinous Periwound:edema Dressing procedure/placement/frequency: Cleanse legs with soap and water and pat dry.  Apply zinc layer from below toes to below knee.  Secure with self adherent coban. Change weekly on Friday.  Will not follow at this time.  Please re-consult if needed.  Domenic Moras MSN, RN, FNP-BC CWON Wound, Ostomy, Continence Nurse Pager [phone]

## 2018-05-31 NOTE — Consult Note (Signed)
NAME: Sharon [name]  DOB: 08/31/1933  MRN: [id number]  Date/Time: 05/31/2018 11:51 AM  REQUESTING PROVIDER: Dr. Margaretmary Eddy  Subjective:  REASON FOR CONSULT: Antibiotic for UTI ? Sharon [name] is a 85 y.o. with a history of type 2 diabetes, foot ulcer, CKD, anemia, sacral ulcers followed at the wound clinic Presented to the emergency department from a nursing facility for low blood pressure on 05/28/2018.  Per EMS patient had a blood pressure in the 16R systolic at the nursing facility .  In the ED her vitals where temperature of 97.2 heart rate of 141 blood pressure of 63/47 SaO2 of 90% and respiratory rate of 16. Labs show a sodium of 134, potassium of 5.2, creatinine of 1.71, glucose of 175, CO2 of 18, AST of 170, ALT of 55, WBC of 9.3, Hb of 14, platelet of 303.  UA showed WBC of >50.  Blood culture and urine culture were sent.  She was started on IV fluids and IV ceftriaxone.  The hypotension was thought to be due to medications especially with trazodone being recently started along with Percocet.  patient responded to IV fluids. I am asked to see the patient as the urine culture is positive for Enterococcus faecalis.  Pt says she has no dysuria, c/o lower abdominal pain- as per her she has been constipated but the nursing documentation in flow sheet mentions a large liquid BM yesterday.  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 Essential hypertension Atrial fibrillation Coronary artery disease B12 deficiency Macular degeneration  Surgical history Hemorrhoidectomy Appendectomy Cholecystectomy Mastoid surgery Left hip joint replacement Right total hip replacement in May 2014 Cataract extraction left Colonoscopy with  adenomatous polyp removal Foot surgery: Keller's surgery left great toe Peripheral vascular balloon angioplasty 04/17/2017.  Social history Former smoker No alcohol   Family history Brother stroke Mother stroke Breast cancer Aunt Lung cancer sister CAD mother  Prior to admission medication Tylenol Amitriptyline 10 mg Lipitor 10 mg Vitamin D3 Ferrous sulfate Furosemide Gabapentin Time glimepiride Januvia Levothyroxine Metoprolol Tramadol Trazodone Vitamin C Allergies  Allergen Reactions  . Carbamazepine Other (See Comments)    Reaction: unknown  . Cymbalta [Duloxetine Hcl] Other (See Comments)    Confusion, disorientation  . Duloxetine     Other reaction(s): Other (See Comments) Confusion, disorientation  . Lyrica [Pregabalin] Other (See Comments)    Patient and son states this makes the patient confused.  . Temazepam     Other reaction(s): Hallucination     ? Current Facility-Administered Medications  Medication Dose Route Frequency Provider Last Rate Last Dose  . acetaminophen (TYLENOL) tablet 1,000 mg  1,000 mg Oral QHS Salary, Montell D, MD   1,000 mg at 05/30/18 2124  . albuterol (PROVENTIL) (2.5 MG/3ML) 0.083% nebulizer solution 2.5 mg  2.5 mg Nebulization Q6H PRN Salary, Montell D, MD      . amiodarone (PACERONE) tablet 200 mg  200 mg Oral Daily Salary, Montell D, MD   200 mg at 05/31/18 0855  . atorvastatin (LIPITOR) tablet 10 mg  10 mg Oral Q2200 Salary, Montell D, MD   10 mg at 05/30/18 2125  . bisacodyl (DULCOLAX) EC tablet 5 mg  5 mg Oral Daily Salary, Montell D, MD   5 mg at 05/31/18 0855  . cefTRIAXone (ROCEPHIN) 1 g in sodium  chloride 0.9 % 100 mL IVPB  1 g Intravenous Q24H Salary, Montell D, MD 200 mL/hr at 05/30/18 1751 1 g at 05/30/18 1751  . cholecalciferol (VITAMIN D) tablet 1,000 Units  1,000 Units Oral Daily Salary, Holly Bodily D, MD   1,000 Units at 05/31/18 0856  . collagenase (SANTYL) ointment   Topical Daily Salary, Montell D, MD      .  enoxaparin (LOVENOX) injection 40 mg  40 mg Subcutaneous Q24H Lu Duffel, RPH   40 mg at 05/30/18 2124  . feeding supplement (ENSURE ENLIVE) (ENSURE ENLIVE) liquid 237 mL  237 mL Oral BID BM Salary, Montell D, MD      . feeding supplement (GLUCERNA SHAKE) (GLUCERNA SHAKE) liquid 237 mL  237 mL Oral BID BM Salary, Montell D, MD      . feeding supplement (PRO-STAT SUGAR FREE 64) liquid 30 mL  30 mL Oral BID BM Salary, Montell D, MD   30 mL at 05/29/18 1500  . ferrous sulfate tablet 325 mg  325 mg Oral Daily Salary, Montell D, MD   325 mg at 05/31/18 0856  . fluconazole (DIFLUCAN) tablet 150 mg  150 mg Oral Daily Salary, Montell D, MD   150 mg at 05/31/18 0856  . insulin aspart (novoLOG) injection 0-5 Units  0-5 Units Subcutaneous QHS Lance Coon, MD      . insulin aspart (novoLOG) injection 0-9 Units  0-9 Units Subcutaneous TID WC Lance Coon, MD   1 Units at 05/30/18 1749  . insulin aspart (novoLOG) injection 3 Units  3 Units Subcutaneous TID WC Salary, Montell D, MD   3 Units at 05/30/18 1748  . insulin glargine (LANTUS) injection 15 Units  15 Units Subcutaneous QHS Loney Hering D, MD   15 Units at 05/30/18 2124  . levothyroxine (SYNTHROID) tablet 112 mcg  112 mcg Oral QAC breakfast Loney Hering D, MD   112 mcg at 05/31/18 [phone]  . linaclotide (LINZESS) capsule 145 mcg  145 mcg Oral Daily Loney Hering D, MD   145 mcg at 05/31/18 0856  . linagliptin (TRADJENTA) tablet 5 mg  5 mg Oral Daily Salary, Montell D, MD   5 mg at 05/31/18 0855  . metoprolol tartrate (LOPRESSOR) tablet 25 mg  25 mg Oral BID Stark Jock, Jude, MD   25 mg at 05/31/18 0855  . multivitamin with minerals tablet 1 tablet  1 tablet Oral Daily Salary, Avel Peace, MD   1 tablet at 05/31/18 0855  . mupirocin ointment (BACTROBAN) 2 % 1 application  1 application Nasal BID Salary, Holly Bodily D, MD   1 application at 62/13/08 0857  . nitroGLYCERIN (NITROGLYN) 2 % ointment 0.5 inch  0.5 inch Topical Q6H Harrie Foreman, MD    0.5 inch at 05/31/18 [phone]  . ondansetron (ZOFRAN) tablet 4 mg  4 mg Oral Q6H PRN Salary, Montell D, MD       Or  . ondansetron (ZOFRAN) injection 4 mg  4 mg Intravenous Q6H PRN Salary, Montell D, MD      . oxyCODONE-acetaminophen (PERCOCET/ROXICET) 5-325 MG per tablet 1 tablet  1 tablet Oral Q4H PRN Salary, Avel Peace, MD   1 tablet at 05/31/18 0855  . polyethylene glycol (MIRALAX / GLYCOLAX) packet 17 g  17 g Oral Daily PRN Salary, Montell D, MD      . simethicone (MYLICON) chewable tablet 80 mg  80 mg Oral Q6H Salary, Montell D, MD   80 mg at 05/31/18 0614  . sodium chloride flush (  NS) 0.9 % injection 3 mL  3 mL Intravenous Q12H Salary, Montell D, MD   3 mL at 05/31/18 0857  . traZODone (DESYREL) tablet 50 mg  50 mg Oral QHS PRN Lance Coon, MD   50 mg at 05/30/18 2124  . vitamin C (ASCORBIC ACID) tablet 250 mg  250 mg Oral BID Salary, Montell D, MD   250 mg at 05/31/18 5465   Facility-Administered Medications Ordered in Other Encounters  Medication Dose Route Frequency Provider Last Rate Last Dose  . sodium chloride flush (NS) 0.9 % injection 10 mL  10 mL Intracatheter PRN Karen Kitchens, NP         Abtx:  Anti-infectives (From admission, onward)   Start     Dose/Rate Route Frequency Ordered Stop   05/29/18 1800  cefTRIAXone (ROCEPHIN) 1 g in sodium chloride 0.9 % 100 mL IVPB     1 g 200 mL/hr over 30 Minutes Intravenous Every 24 hours 05/28/18 1710     05/29/18 1000  fluconazole (DIFLUCAN) tablet 150 mg     150 mg Oral Daily 05/28/18 1710     05/28/18 1400  cefTRIAXone (ROCEPHIN) 1 g in sodium chloride 0.9 % 100 mL IVPB     1 g 200 mL/hr over 30 Minutes Intravenous  Once 05/28/18 1358 05/28/18 1634      REVIEW OF SYSTEMS:  Const: negative fever, negative chills, negative weight loss Eyes: negative diplopia or visual changes, negative eye pain ENT: negative coryza, negative sore throat Resp: negative cough, hemoptysis, dyspnea Cards: negative for chest pain, palpitations, lower  extremity edema GU: negative for frequency, dysuria and hematuria GI: lower abdominal pain, says she is constipated but I/O say watery stool last night Skin: negative for rash and pruritus Heme: negative for easy bruising and gum/nose bleeding MS: negative for myalgias, arthralgias, back pain and muscle weakness Neurolo:negative for headaches, dizziness, vertigo, memory problems  Psych: negative for feelings of anxiety, depression   Allergy/Immunology-as noted : Objective:  VITALS:  BP (!) 166/105 (BP Location: Right Arm)   Pulse 63   Temp 97.8 F (36.6 C) (Oral)   Resp 18   Ht 5\' 5"  (1.651 m)   Wt 58.7 kg   SpO2 99%   BMI 21.52 kg/m  PHYSICAL EXAM:  General: Alert, cooperative, no distress, appears stated age.  Head: Normocephalic, without obvious abnormality, atraumatic. Eyes: Conjunctivae clear, anicteric sclerae. Pupils are equal ENT Nares normal. No drainage or sinus tenderness. Lips, mucosa, and tongue normal. No Thrush Neck: Supple, symmetrical, no adenopathy, thyroid: non tender no carotid bruit and no JVD. Back: sacrum 1 cm stage 2  superficial ulcer Lungs:b/l air entry Heart:s1s2  Abdomen: Soft, some lower abdomen tenderness Bowel sounds normal. No masses Extremities: b/l leg Coban wrap removed = venous staining- no edema- dry skin over the heel on the left side with a small wound Skin: No rashes or lesions. Or bruising Lymph: Cervical, supraclavicular normal. Neurologic: Grossly non-focal Pertinent Labs Lab Results CBC CBC Latest Ref Rng & Units 05/30/2018 05/28/2018 04/29/2018  WBC 4.0 - 10.5 K/uL 7.2 9.3 14.1(H)  Hemoglobin 12.0 - 15.0 g/dL 11.2(L) 14.0 13.0  Hematocrit 36.0 - 46.0 % 36.4 45.9 40.7  Platelets 150 - 400 K/uL 222 303 300    CMP Latest Ref Rng & Units 05/31/2018 05/30/2018 05/29/2018  Glucose 70 - 99 mg/dL 95 86 80  BUN 8 - 23 mg/dL 8 13 21   Creatinine 0.44 - 1.00 mg/dL 0.92 0.93 1.38(H)  Sodium 135 -  145 mmol/L 139 140 139  Potassium 3.5 -  5.1 mmol/L 3.4(L) 3.4(L) 4.1  Chloride 98 - 111 mmol/L 111 112(H) 110  CO2 22 - 32 mmol/L 22 21(L) 22  Calcium 8.9 - 10.3 mg/dL 8.1(L) 7.9(L) 8.0(L)  Total Protein 6.5 - 8.1 g/dL - - -  Total Bilirubin 0.3 - 1.2 mg/dL - - -  Alkaline Phos 38 - 126 U/L - - -  AST 15 - 41 U/L - - -  ALT 0 - 44 U/L - - -      Microbiology: Recent Results (from the past 240 hour(s))  Urine Culture     Status: Abnormal   Collection Time: 05/28/18  1:18 PM  Result Value Ref Range Status   Specimen Description   Final    URINE, RANDOM Performed at [facility], [address] [place], [place] [address]    Special Requests   Final    NONE Performed at [facility], [address] [place], [place] [address]    Culture >=100,000 COLONIES/mL ENTEROCOCCUS FAECALIS (A)  Final   Report Status 05/31/2018 FINAL  Final   Organism ID, Bacteria ENTEROCOCCUS FAECALIS (A)  Final      Susceptibility   Enterococcus faecalis - MIC*    AMPICILLIN <=2 SENSITIVE Sensitive     LEVOFLOXACIN >=8 RESISTANT Resistant     NITROFURANTOIN <=16 SENSITIVE Sensitive     VANCOMYCIN 1 SENSITIVE Sensitive     * >=100,000 COLONIES/mL ENTEROCOCCUS FAECALIS  SARS Coronavirus 2 (CEPHEID - Performed in [facility] hospital lab), Hosp Order     Status: None   Collection Time: 05/28/18  1:48 PM  Result Value Ref Range Status   SARS Coronavirus 2 NEGATIVE NEGATIVE Final    Comment: (NOTE) If result is NEGATIVE SARS-CoV-2 target nucleic acids are NOT DETECTED. The SARS-CoV-2 RNA is generally detectable in upper and lower  respiratory specimens during the acute phase of infection. The lowest  concentration of SARS-CoV-2 viral copies this assay can detect is 250  copies / mL. A negative result does not preclude SARS-CoV-2 infection  and should not be used as the sole basis for treatment or other  patient management decisions.  A negative result may occur with  improper specimen collection / handling, submission of  specimen other  than nasopharyngeal swab, presence of viral mutation(s) within the  areas targeted by this assay, and inadequate number of viral copies  (<250 copies / mL). A negative result must be combined with clinical  observations, patient history, and epidemiological information. If result is POSITIVE SARS-CoV-2 target nucleic acids are DETECTED. The SARS-CoV-2 RNA is generally detectable in upper and lower  respiratory specimens dur ing the acute phase of infection.  Positive  results are indicative of active infection with SARS-CoV-2.  Clinical  correlation with patient history and other diagnostic information is  necessary to determine patient infection status.  Positive results do  not rule out bacterial infection or co-infection with other viruses. If result is PRESUMPTIVE POSTIVE SARS-CoV-2 nucleic acids MAY BE PRESENT.   A presumptive positive result was obtained on the submitted specimen  and confirmed on repeat testing.  While 2019 novel coronavirus  (SARS-CoV-2) nucleic acids may be present in the submitted sample  additional confirmatory testing may be necessary for epidemiological  and / or clinical management purposes  to differentiate between  SARS-CoV-2 and other Sarbecovirus currently known to infect humans.  If clinically indicated additional testing with an alternate test  methodology [id number]) is  advised. The SARS-CoV-2 RNA is generally  detectable in upper and lower respiratory sp ecimens during the acute  phase of infection. The expected result is Negative. Fact Sheet for Patients:  StrictlyIdeas.no Fact Sheet for Healthcare Providers: BankingDealers.co.za This test is not yet approved or cleared by the [place] FDA and has been authorized for detection and/or diagnosis of SARS-CoV-2 by FDA under an Emergency Use Authorization (EUA).  This EUA will remain in effect (meaning this test can be used) for the  duration of the COVID-19 declaration under Section 564(b)(1) of the Act, 21 U.S.C. section 360bbb-3(b)(1), unless the authorization is terminated or revoked sooner. Performed at [facility], [address] [place], [place] [address]   MRSA PCR Screening     Status: None   Collection Time: 05/29/18 12:16 AM  Result Value Ref Range Status   MRSA by PCR NEGATIVE NEGATIVE Final    Comment:        The GeneXpert MRSA Assay (FDA approved for NASAL specimens only), is one component of a comprehensive MRSA colonization surveillance program. It is not intended to diagnose MRSA infection nor to guide or monitor treatment for MRSA infections. Performed at [facility], [address] [place], [place] [address]     IMAGING RESULTS: I have personally reviewed the films ? Impression/Recommendation ? ?Hypotension- has resolved- thought to be medication related - management as per primary team  Enterococcus fecalis  in the urine is likely a stool contaminant. She got better without treating it with appropriate antibiotic hence doubt this is a true infection- Dc ceftriaxone Post void bladder scan done and it showed < 90 cc urine- not significant  Abnormal LFT  AKI resolved with IV fluids  Chronic low back pain secondary to L1 fracture  Sacral ulcer- superficial 1 cm ? ___________________________________________________ Discussed with patient, her nurses and  requesting provider ID will sign off-call if needed Note:  This document was prepared using Dragon voice recognition software and may include unintentional dictation errors.

## 2018-05-31 NOTE — Progress Notes (Addendum)
Sharon [name] NAME: Sharon [name]    MR#:  [id number]  DATE OF BIRTH:  01/11/1934  SUBJECTIVE:  CHIEF COMPLAINT:   Chief Complaint  Patient presents with  . Hypotension   Patient is resting comfortably REVIEW OF SYSTEMS:  Review of Systems  Unable to perform ROS: Acuity of condition  Constitutional: Negative for chills and fever.  HENT: Negative for hearing loss and tinnitus.   Eyes: Negative for blurred vision and double vision.  Respiratory: Negative for cough and hemoptysis.   Cardiovascular: Negative for chest pain and palpitations.  Gastrointestinal: Negative for heartburn and nausea.  Genitourinary: Negative for dysuria.  Musculoskeletal: Negative for myalgias and neck pain.  Skin: Negative for itching and rash.  Neurological: Negative for dizziness and headaches.  Psychiatric/Behavioral: Negative for depression and hallucinations.    DRUG ALLERGIES:   Allergies  Allergen Reactions  . Carbamazepine Other (See Comments)    Reaction: unknown  . Cymbalta [Duloxetine Hcl] Other (See Comments)    Confusion, disorientation  . Duloxetine     Other reaction(s): Other (See Comments) Confusion, disorientation  . Lyrica [Pregabalin] Other (See Comments)    Patient and son states this makes the patient confused.  . Temazepam     Other reaction(s): Hallucination   VITALS:  Blood pressure (!) 166/105, pulse 63, temperature 97.8 F (36.6 C), temperature source Oral, resp. rate 18, height 5\' 5"  (1.651 m), weight 58.7 kg, SpO2 99 %. PHYSICAL EXAMINATION:   GENERAL:  85 y.o.-year-old patient lying in the bed with no acute distress.  Cachectic/frail appearing EYES: Pupils equal, round, reactive to light and accommodation. No scleral icterus. Extraocular muscles intact.  HEENT: Head atraumatic, normocephalic. Oropharynx and nasopharynx clear.  NECK:  Supple, no jugular venous distention. No thyroid enlargement, no tenderness.   LUNGS: Normal breath sounds bilaterally, no wheezing, rales,rhonchi or crepitation. No use of accessory muscles of respiration.  CARDIOVASCULAR: S1, S2 normal. No murmurs, rubs, or gallops.  ABDOMEN: Soft, nontender, nondistended. Bowel sounds present. No organomegaly or mass.  EXTREMITIES: No pedal edema, cyanosis, or clubbing. Bilateral lower extremity leg dressings recently placed at wound care clinic  NEUROLOGIC: Cranial nerves II through XII are intact. Muscle strength 5/5 in all extremities. Sensation intact. Gait not checked.  PSYCHIATRIC: The patient is alert and oriented x 3.  SKIN: No obvious rash, lesion, or ulcer.   LABORATORY PANEL:  Female CBC Recent Labs  Lab 05/30/18 0439  WBC 7.2  HGB 11.2*  HCT 36.4  PLT 222   ------------------------------------------------------------------------------------------------------------------ Chemistries  Recent Labs  Lab 05/28/18 1038  05/30/18 0439 05/31/18 0503  NA 134*   < > 140 139  K 5.2*   < > 3.4* 3.4*  CL 104   < > 112* 111  CO2 18*   < > 21* 22  GLUCOSE 175*   < > 86 95  BUN 25*   < > 13 8  CREATININE 1.71*   < > 0.93 0.92  CALCIUM 8.3*   < > 7.9* 8.1*  MG  --   --  1.8  --   AST 170*  --   --   --   ALT 55*  --   --   --   ALKPHOS 357*  --   --   --   BILITOT 0.7  --   --   --    < > = values in this interval not displayed.   RADIOLOGY:  No  results found. ASSESSMENT AND PLAN:  *Acute urinary tract infection Patient on empiric antibiotics with Rocephin pending clinical course and culture results. Urine has revealed Enterococcus faecalis which can be a contaminant.  ID consult placed.  Recommending to do bladder scan to see urine residual We will follow-up with ID  *Acute severe hypotension resolved currently patient is hypertensive   This was also thought to be medication related given patient being started on trazodone recently in addition to being on Percocet.  Appears to have responded well to IV  fluids.Blood pressure improved with recent blood pressure 154/84.  Metoprolol her home medication and Lasix resumed Discontinued IV fluids  *Acute on chronic pain syndrome Noted history of L1 fracture Stable Pain protocol provided blood pressure stable  *Chronic diabetes mellitus type 2 Continue home regiment, carbohydrate consistent diet, sliding scale insulin with Accu-Cheks per routine  *Chronic bilateral lower extremity leg wounds 1 cm round nonintact lesion to the left anterior lower leg.  Bilateral Unna boots changing weekly.  Wound bed radiated with minimal serosanguineous discharge and minimal edema. Cleanse legs with soap and water and pat dry.  Apply zinc layer from below toes to below knee.  Secure with self adherent coban. Change weekly on Friday per wound care recommendations Continue local wound care  *Chronic adult failure to thrive/malnutrition Dietary consulted  *History of atrial fibrillation Stable Continue amiodarone, Lopressor, not on oral anticoagulation  *Chronic hypothyroidism, unspecified Continue Synthroid.  TSH normal.  DVT prophylaxis-Lovenox   All the records are reviewed and case discussed with Care Management/Social Worker. Call placed to son Sharon [name] daughter at [phone] for update, voicemail left to call back  CODE STATUS: Full Code  TOTAL TIME TAKING CARE OF THIS PATIENT: 35 minutes.   More than 50% of the time was spent in counseling/coordination of care: YES  POSSIBLE D/C IN 1DAYS, DEPENDING ON CLINICAL CONDITION.   Sharon [name] M.D on 05/31/2018 at 3:30 PM  Between 7am to 6pm - Pager [phone]  After 6pm go to [email or web address] - Proofreader  [facility]  Office  [phone]  CC: Primary care physician; Sharon Aus, MD  Note: This dictation was prepared with Dragon dictation along with smaller phrase technology. Any transcriptional errors that result from this process are unintentional.

## 2018-05-31 NOTE — Progress Notes (Signed)
Bedtime CBG 109 and patient not eating very much. MD made aware, 15 units Lantus held. Will continue to monitor

## 2018-05-31 NOTE — Progress Notes (Signed)
Patient has been restless all night, not sleeping. Frequent rounds being performed for patient safety. Patient found attempting to get out of bed. She was confused and thought she needed to get up to go the bathroom. Patient reoriented that she had external cath. Patient had pulled all covers off. Patient repositioned in bed for comfort. Will continue to monitor patient.

## 2018-06-01 ENCOUNTER — Inpatient Hospital Stay: Payer: PPO

## 2018-06-01 LAB — GLUCOSE, CAPILLARY
Glucose-Capillary: 109 mg/dL — ABNORMAL HIGH (ref 70–99)
Glucose-Capillary: 143 mg/dL — ABNORMAL HIGH (ref 70–99)
Glucose-Capillary: 102 mg/dL — ABNORMAL HIGH (ref 70–99)
Glucose-Capillary: 111 mg/dL — ABNORMAL HIGH (ref 70–99)

## 2018-06-01 MED ORDER — POTASSIUM CHLORIDE CRYS ER 20 MEQ PO TBCR
40.0000 meq | EXTENDED_RELEASE_TABLET | Freq: Once | ORAL | Status: AC
  Administered 2018-06-01: 40 meq via ORAL
  Filled 2018-06-01: qty 2

## 2018-06-01 MED ORDER — CEFAZOLIN SODIUM-DEXTROSE 1-4 GM/50ML-% IV SOLN
1.0000 g | Freq: Once | INTRAVENOUS | Status: AC
  Administered 2018-06-03: 1 g via INTRAVENOUS
  Filled 2018-06-01 (×2): qty 50

## 2018-06-01 NOTE — Progress Notes (Signed)
PT Cancellation Note  Patient Details Name: Sharon [name] MRN: [id number] DOB: Sep 22, 1933   Cancelled Treatment:    Reason Eval/Treat Not Completed: Other (comment);Patient not medically ready(Hold per physician until s/p kyphoplasty. Will follow patient closely and will attempt again at later time/date. )  Janna Arch, PT, DPT   06/01/2018, 3:48 PM

## 2018-06-01 NOTE — Progress Notes (Signed)
[facility] at [place] NAME: Dilcia Rybarczyk    MR#:  [id number]  DATE OF BIRTH:  Jun 17, 1933  SUBJECTIVE:  CHIEF COMPLAINT:   Chief Complaint  Patient presents with  . Hypotension   Patient is resting comfortably.  Bound to wheelchair lately according to the son Requesting kyphoplasty Patient has baseline dementia and sundown's according to the son REVIEW OF SYSTEMS:  Review of Systems  Unable to perform ROS: Acuity of condition  Constitutional: Negative for chills and fever.  HENT: Negative for hearing loss and tinnitus.   Eyes: Negative for blurred vision and double vision.  Respiratory: Negative for cough and hemoptysis.   Cardiovascular: Negative for chest pain and palpitations.  Gastrointestinal: Negative for heartburn and nausea.  Genitourinary: Negative for dysuria.  Musculoskeletal: Negative for myalgias and neck pain.  Skin: Negative for itching and rash.  Neurological: Negative for dizziness and headaches.  Psychiatric/Behavioral: Negative for depression and hallucinations.    DRUG ALLERGIES:   Allergies  Allergen Reactions  . Carbamazepine Other (See Comments)    Reaction: unknown  . Cymbalta [Duloxetine Hcl] Other (See Comments)    Confusion, disorientation  . Duloxetine     Other reaction(s): Other (See Comments) Confusion, disorientation  . Lyrica [Pregabalin] Other (See Comments)    Patient and son states this makes the patient confused.  . Temazepam     Other reaction(s): Hallucination   VITALS:  Blood pressure 120/84, pulse (!) 47, temperature 98.1 F (36.7 C), temperature source Oral, resp. rate 18, height 5\' 5"  (1.651 m), weight 55.7 kg, SpO2 92 %. PHYSICAL EXAMINATION:   GENERAL:  85 y.o.-year-old patient lying in the bed with no acute distress.  Cachectic/frail appearing EYES: Pupils equal, round, reactive to light and accommodation. No scleral icterus. Extraocular muscles intact.  HEENT: Head  atraumatic, normocephalic. Oropharynx and nasopharynx clear.  NECK:  Supple, no jugular venous distention. No thyroid enlargement, no tenderness.  LUNGS: Normal breath sounds bilaterally, no wheezing, rales,rhonchi or crepitation. No use of accessory muscles of respiration.  CARDIOVASCULAR: S1, S2 normal. No murmurs, rubs, or gallops.  ABDOMEN: Soft, nontender, nondistended. Bowel sounds present.  EXTREMITIES: No pedal edema, cyanosis, or clubbing. Bilateral lower extremity leg dressings recently placed at wound care clinic  NEUROLOGIC: Awake alert and disoriented from dementia sensation intact. Gait not checked.  PSYCHIATRIC: The patient is alert and disoriented SKIN: No obvious rash, lesion, or ulcer.   LABORATORY PANEL:  Female CBC Recent Labs  Lab 05/30/18 0439  WBC 7.2  HGB 11.2*  HCT 36.4  PLT 222   ------------------------------------------------------------------------------------------------------------------ Chemistries  Recent Labs  Lab 05/28/18 1038  05/30/18 0439 05/31/18 0503  NA 134*   < > 140 139  K 5.2*   < > 3.4* 3.4*  CL 104   < > 112* 111  CO2 18*   < > 21* 22  GLUCOSE 175*   < > 86 95  BUN 25*   < > 13 8  CREATININE 1.71*   < > 0.93 0.92  CALCIUM 8.3*   < > 7.9* 8.1*  MG  --   --  1.8  --   AST 170*  --   --   --   ALT 55*  --   --   --   ALKPHOS 357*  --   --   --   BILITOT 0.7  --   --   --    < > = values in this interval  not displayed.   RADIOLOGY:  Dg Lumbar Spine 2-3 Views  Result Date: 06/01/2018 CLINICAL DATA:  Lower back pain EXAM: LUMBAR SPINE - 2-3 VIEW COMPARISON:  CT of the pelvis dated 04/25/2018. FINDINGS: There is a new compression fracture of the L1 vertebral body with approximately 20% height loss anteriorly. Advanced degenerative changes are noted throughout the remaining portions of the lumbar spine. Bilateral common iliac stents are noted. The patient is status post bilateral total hip arthroplasty. There is diffuse osteopenia.  IMPRESSION: 1. Age-indeterminate compression fracture of the L1 vertebral body with approximately 20% height loss anteriorly. This is new since CT dated 04/25/2018. 2. Advanced degenerative changes throughout the lumbar spine. Osteopenia. Electronically Signed   By: Constance Holster M.D.   On: 06/01/2018 13:42   ASSESSMENT AND PLAN:  *Acute urinary tract infection Patient initially started on on empiric antibiotics with Rocephin Urine has revealed Enterococcus faecalis which can be a contaminant.  ID consult placed.  Patient's post void bladder scan has revealed less than 90 cc urine which is not significant.  Patient clinically has gotten better without treating with appropriate antibiotic so infectious disease has recommended to discontinue ceftriaxone and no other need of antibiotics  *Acute on chronic pain syndrome-significant low back pain Noted history of L1 fracture, x-ray confirms L1 compression fracture Pain protocol provided blood pressure stable Consult Dr. Rudene Christians for kyphoplasty as requested by son  *Sacral pressure ulcer stage II reposition patient and wound care  *Acute severe hypotension resolved currently patient is hypertensive   This was also thought to be medication related given patient being started on trazodone recently in addition to being on Percocet.  Appears to have responded well to IV fluids.Blood pressure improved with recent blood pressure 154/84.  Metoprolol her home medication and Lasix resumed Discontinued IV fluids  *Chronic diabetes mellitus type 2 Continue home regiment, carbohydrate consistent diet, sliding scale insulin with Accu-Cheks per routine  *Chronic bilateral lower extremity leg wounds 1 cm round nonintact lesion to the left anterior lower leg.  Bilateral Unna boots changing weekly.  Wound bed radiated with minimal serosanguineous discharge and minimal edema. Cleanse legs with soap and water and pat dry.  Apply zinc layer from below toes to below  knee.  Secure with self adherent coban. Change weekly on Friday per wound care recommendations Continue local wound care  *Chronic adult failure to thrive/malnutrition Dietary consulted  *History of atrial fibrillation Stable Continue amiodarone, Lopressor, not on oral anticoagulation  *Chronic hypothyroidism, unspecified Continue Synthroid.  TSH normal.  DVT prophylaxis-Lovenox   All the records are reviewed and case discussed with Care Management/Social Worker. Call placed and discussed with  son [name] at [phone] given update update  CODE STATUS: Full Code  TOTAL TIME TAKING CARE OF THIS PATIENT: 35 minutes.   More than 50% of the time was spent in counseling/coordination of care: YES  POSSIBLE D/C IN 1DAYS, DEPENDING ON CLINICAL CONDITION.   Nicholes Mango M.D on 06/01/2018 at 2:46 PM  Between 7am to 6pm - Pager [phone]  After 6pm go to [email or web address] - Proofreader  [facility]  Office  [phone]  CC: Primary care physician; Rusty Aus, MD  Note: This dictation was prepared with Dragon dictation along with smaller phrase technology. Any transcriptional errors that result from this process are unintentional.

## 2018-06-01 NOTE — Consult Note (Signed)
Reason for consult is L1 compression fracture  History: Patient is unable to recall any injury but she does suffer separate from significant confusion.  She had a CT scan on 05/03/2018 that showed no compression fracture and new lumbar spine spine films now show a new L1 compression fracture with approximately 25% compression.  Adjacent vertebrae appear normal.  She does have pain with any movement and trying to get out of bed.  On examination she has a kyphotic deformity at L1 and to percussion in that area only L1 is tender.  Neurologic exam she has had foot dressings in place and initially came from wound care but had no clonus on clonus testing and negative straight leg raise.  Impression is L1 compression fracture with significant pain  Plan is L1 kyphoplasty on Thursday this week.  Discussed this with her son and he is in agreement.

## 2018-06-02 DIAGNOSIS — Z515 Encounter for palliative care: Secondary | ICD-10-CM

## 2018-06-02 DIAGNOSIS — Z7189 Other specified counseling: Secondary | ICD-10-CM

## 2018-06-02 DIAGNOSIS — N390 Urinary tract infection, site not specified: Secondary | ICD-10-CM

## 2018-06-02 DIAGNOSIS — M549 Dorsalgia, unspecified: Secondary | ICD-10-CM

## 2018-06-02 DIAGNOSIS — Z419 Encounter for procedure for purposes other than remedying health state, unspecified: Secondary | ICD-10-CM

## 2018-06-02 LAB — GLUCOSE, CAPILLARY
Glucose-Capillary: 111 mg/dL — ABNORMAL HIGH (ref 70–99)
Glucose-Capillary: 125 mg/dL — ABNORMAL HIGH (ref 70–99)
Glucose-Capillary: 169 mg/dL — ABNORMAL HIGH (ref 70–99)
Glucose-Capillary: 148 mg/dL — ABNORMAL HIGH (ref 70–99)

## 2018-06-02 MED ORDER — QUETIAPINE FUMARATE 25 MG PO TABS
25.0000 mg | ORAL_TABLET | Freq: Two times a day (BID) | ORAL | Status: DC | PRN
  Administered 2018-06-02: 25 mg via ORAL
  Filled 2018-06-02: qty 1

## 2018-06-02 NOTE — Progress Notes (Signed)
Patient having pain with movement, site marked for L1 kyphoplasty tomorrow.

## 2018-06-02 NOTE — Progress Notes (Signed)
Pt son called, pt was getting a bed bath and unable to speak to him at the time. Pt son advised to call back in 5 minutes to the room. Pt notified, phone in reach.

## 2018-06-02 NOTE — Progress Notes (Signed)
[facility] at [place] NAME: Sharon [name]    MR#:  [id number]  DATE OF BIRTH:  1933-05-01  SUBJECTIVE:  CHIEF COMPLAINT:   Chief Complaint  Patient presents with  . Hypotension   Patient is resting comfortably.  No overnight incidents  bound to wheelchair lately after her L1 compression fracture.  Scheduled for kyphoplasty tomorrow Patient has baseline dementia and sundown's according to the son REVIEW OF SYSTEMS:  Review of Systems  Unable to perform ROS: Acuity of condition  Constitutional: Negative for chills and fever.  HENT: Negative for hearing loss and tinnitus.   Eyes: Negative for blurred vision and double vision.  Respiratory: Negative for cough and hemoptysis.   Cardiovascular: Negative for chest pain and palpitations.  Gastrointestinal: Negative for heartburn and nausea.  Genitourinary: Negative for dysuria.  Musculoskeletal: Negative for myalgias and neck pain.  Skin: Negative for itching and rash.  Neurological: Negative for dizziness and headaches.  Psychiatric/Behavioral: Negative for depression and hallucinations.    DRUG ALLERGIES:   Allergies  Allergen Reactions  . Carbamazepine Other (See Comments)    Reaction: unknown  . Cymbalta [Duloxetine Hcl] Other (See Comments)    Confusion, disorientation  . Duloxetine     Other reaction(s): Other (See Comments) Confusion, disorientation  . Lyrica [Pregabalin] Other (See Comments)    Patient and son states this makes the patient confused.  . Temazepam     Other reaction(s): Hallucination   VITALS:  Blood pressure 96/61, pulse (!) 55, temperature 97.8 F (36.6 C), temperature source Oral, resp. rate 16, height 5\' 5"  (1.651 m), weight 55.9 kg, SpO2 95 %. PHYSICAL EXAMINATION:   GENERAL:  85 y.o.-year-old patient lying in the bed with no acute distress.  Cachectic/frail appearing EYES: Pupils equal, round, reactive to light and accommodation. No scleral icterus.  Extraocular muscles intact.  HEENT: Head atraumatic, normocephalic. Oropharynx and nasopharynx clear.  NECK:  Supple, no jugular venous distention. No thyroid enlargement, no tenderness.  LUNGS: Normal breath sounds bilaterally, no wheezing, rales,rhonchi or crepitation. No use of accessory muscles of respiration.  CARDIOVASCULAR: S1, S2 normal. No murmurs, rubs, or gallops.  ABDOMEN: Soft, nontender, nondistended. Bowel sounds present.  EXTREMITIES: No pedal edema, cyanosis, or clubbing. Bilateral lower extremity leg dressings recently placed at wound care clinic  NEUROLOGIC: Awake alert and disoriented from dementia sensation intact. Gait not checked.  PSYCHIATRIC: The patient is alert and disoriented SKIN: No obvious rash, lesion, or ulcer.   LABORATORY PANEL:  Female CBC Recent Labs  Lab 05/30/18 0439  WBC 7.2  HGB 11.2*  HCT 36.4  PLT 222   ------------------------------------------------------------------------------------------------------------------ Chemistries  Recent Labs  Lab 05/28/18 1038  05/30/18 0439 05/31/18 0503  NA 134*   < > 140 139  K 5.2*   < > 3.4* 3.4*  CL 104   < > 112* 111  CO2 18*   < > 21* 22  GLUCOSE 175*   < > 86 95  BUN 25*   < > 13 8  CREATININE 1.71*   < > 0.93 0.92  CALCIUM 8.3*   < > 7.9* 8.1*  MG  --   --  1.8  --   AST 170*  --   --   --   ALT 55*  --   --   --   ALKPHOS 357*  --   --   --   BILITOT 0.7  --   --   --    < > =  values in this interval not displayed.   RADIOLOGY:  No results found. ASSESSMENT AND PLAN:  *Acute on chronic pain syndrome-significant low back pain Noted history of L1 fracture, x-ray confirms L1 compression fracture Pain protocol provided blood pressure stable Consult Dr. Rudene Christians for kyphoplasty -scheduled for kyphoplasty in a.m.    *Acute urinary tract infection Patient initially started on on empiric antibiotics with Rocephin Urine has revealed Enterococcus faecalis which can be a contaminant.  ID  consult placed.  Patient's post void bladder scan has revealed less than 90 cc urine which is not significant.  Patient clinically has gotten better without treating with appropriate antibiotic so infectious disease has recommended to discontinue ceftriaxone and no other need of antibiotics  *Sacral pressure ulcer stage II reposition patient and wound care  *Acute severe hypotension resolved currently patient is hypertensive   This was also thought to be medication related given patient being started on trazodone recently in addition to being on Percocet.  Appears to have responded well to IV fluids.Blood pressure improved with recent blood pressure 154/84.  Metoprolol her home medication and Lasix resumed Discontinued IV fluids  *Chronic diabetes mellitus type 2 Continue home regiment, carbohydrate consistent diet, sliding scale insulin with Accu-Cheks per routine  *Chronic bilateral lower extremity leg wounds 1 cm round nonintact lesion to the left anterior lower leg.  Bilateral Unna boots changing weekly.  Wound bed radiated with minimal serosanguineous discharge and minimal edema. Cleanse legs with soap and water and pat dry.  Apply zinc layer from below toes to below knee.  Secure with self adherent coban. Change weekly on Friday per wound care recommendations Continue local wound care.  Wound care is following  *Chronic adult failure to thrive/malnutrition Dietary supplements  *History of atrial fibrillation Stable Continue amiodarone, Lopressor, not on oral anticoagulation  *Chronic hypothyroidism, unspecified Continue Synthroid.  TSH normal.  *Patient will be seen by physical therapy after kyphoplasty  DVT prophylaxis-Lovenox   All the records are reviewed and case discussed with Care Management/Social Worker. Call placed and discussed with  son [name] at [phone] given update update  CODE STATUS: Full Code  TOTAL TIME TAKING CARE OF THIS PATIENT: 32  minutes.    More than 50% of the time was spent in counseling/coordination of care: YES  POSSIBLE D/C IN 1DAYS, DEPENDING ON CLINICAL CONDITION.   Nicholes Mango M.D on 06/02/2018 at 2:55 PM  Between 7am to 6pm - Pager [phone]  After 6pm go to [email or web address] - Proofreader  [facility]  Office  [phone]  CC: Primary care physician; Rusty Aus, MD  Note: This dictation was prepared with Dragon dictation along with smaller phrase technology. Any transcriptional errors that result from this process are unintentional.

## 2018-06-02 NOTE — Progress Notes (Signed)
FRANCA, STAKES ([id number]) Visit Report for 05/28/2018 Chief Complaint Document Details Patient Name: Sharon [name], [name] Date of Service: 05/28/2018 9:00 AM Medical Record Number: [id number] Patient Account Number: [id number] Date of Birth/Sex: 10-Jun-1933 (84 y.o. F) Treating RN: Montey Hora Primary Care Provider: Emily Filbert Other Clinician: Referring Provider: Emily Filbert Treating Provider/Extender: Melburn Hake, Chrisean Kloth Weeks in Treatment: 9 Information Obtained from: Patient Chief Complaint Multiple bilateral LE ulcers and sacral ulcer Electronic Signature(s) Signed: 05/29/2018 1:28:28 AM By: Worthy Keeler PA-C Entered By: Worthy Keeler on 05/28/2018 10:23:30 Westmoreland, Samamtha P. ([id number]) -------------------------------------------------------------------------------- HPI Details Patient Name: [name], [name] Date of Service: 05/28/2018 9:00 AM Medical Record Number: [id number] Patient Account Number: [id number] Date of Birth/Sex: 1933/12/25 (84 y.o. F) Treating RN: Montey Hora Primary Care Provider: Emily Filbert Other Clinician: Referring Provider: Emily Filbert Treating Provider/Extender: Melburn Hake, Meric Joye Weeks in Treatment: 9 History of Present Illness HPI Description: 03/23/18 On evaluation today patient presents for initial evaluation our clinic concerning issues that she's been having with the bilateral lower extremities. She has been out of the hospital for about a week and was there for about four days due to fluid overload. She does have congestive heart failure, atrial fibrillation, diabetes mellitus type II, and a right first toe amputation. Currently she is having bilateral lower extremity weeping and swelling. During her time in the hospital that she took her off of her diuretic medications according to what her son is telling me today. With that being said her primary care provider, Dr. Sabra Heck, has placed her back on some of these medications according to what  her son is telling me. She has undergone previous testing including hemoglobin A1c of 7.6 on 01/11/18. She's also had arterial studies which show that her right ABI was 0.89 and the left ABI of 0.94 this obviously is good as well. Currently the biggest concern with both the patient and her son are issues with overall general fluid overload and the fact that just wrapping her legs or managing the legs as we would do here in the clinic will not treat the overall fluid overload issue which I completely agree with. With that being said I think that we definitely need to try to get these areas closed as quickly as possible in order to prevent any chance for infection/cellulitis. 03/30/18 on evaluation today patient actually appears to be doing much better in regard to her lower extremity ulcers. She's been tolerating the dressing changes at this point without complication overall I do not see any signs of anything worsening which is good news. Nonetheless I am hopeful that she will continue to make good improvement she still has a lot of drainage we may want to increase her compression wrap to something a little bit stronger. 04/13/18 on evaluation today patient appears to be doing very well in regard to her lower extremity ulcers. The hill ulcers appear to be the worst but there still making good improvement overall in my pinion. Fortunately there's no evidence of active infection at this time in her swelling seems to be well controlled with the compression wrap. Overall very pleased in this regard. 04/27/18 on evaluation today patient actually appears to be doing better in regard to the majority for wounds on evaluation today. The one caveat is that her heels don't appear to be doing quite as well the left heel in particular is a little bit larger than previous. Fortunately there's no signs of active infection at this time. Overall she's been  tolerating the dressing changes without complication. I did  discuss offloading of the hills with her again today she has not been using a pillow under her legs to float the heels I think this would be of benefit for her. 05/11/18 on evaluation today patient actually appears to be doing well in regard to her lower extremities bilaterally. Unfortunately her heels are still open although they appear to be doing somewhat better as well. She does have a new ulcer in the sacral region which is something that was not needed on previous evaluation. Unfortunately when she saw vascular they also discontinued the wraps and recommended compression stockings. However neither wraps nor compression stockings have been currently utilized since she was last seen here in our clinic. She has a tremendous amount of swelling noted in the bilateral lower extremities at this point as a result and unfortunately. Nonetheless the pressure ulcer likely is due to the fact that she's been sitting in a wheelchair for an extended period of time quite frequently. 05/28/18 on evaluation today patient appears to be doing poorly upon initial presentation. In fact never even took a wraps all through the house she was acting upon arrival. According to her son she had been given a pain medication as well as potential trazodone but nonetheless her blood pressure was low today as was her pulse. Both which had me concerned. She was breathing okay and was able to respond to me although she was very lethargic. Subsequently she also appear to be very dehydrated. Nonetheless I discussed with the song that I would like to actually have her go to the hospital for evaluation and treatment. Electronic Signature(s) Signed: 05/29/2018 1:28:28 AM By: Worthy Keeler PA-C Entered By: Worthy Keeler on 05/28/2018 23:34:11 Barot, [place]ett [place] ([id number]) -------------------------------------------------------------------------------- Physical Exam Details Patient Name: Schillaci, Kele P. Date of Service:  05/28/2018 9:00 AM Medical Record Number: [id number] Patient Account Number: [id number] Date of Birth/Sex: Jun 12, 1933 (84 y.o. F) Treating RN: Montey Hora Primary Care Provider: Emily Filbert Other Clinician: Referring Provider: Emily Filbert Treating Provider/Extender: Melburn Hake, Gianfranco Araki Weeks in Treatment: 9 Constitutional patient is hypotensive.. heart rate depressed.. Thin and well-hydrated in no acute distress. Eyes conjunctiva clear no eyelid edema noted. pupils equal round and reactive to light and accommodation. Ears, Nose, Mouth, and Throat no gross abnormality of ear auricles or external auditory canals. normal hearing noted during conversation. mucus membranes moist. Respiratory normal breathing without difficulty. clear to auscultation bilaterally. Cardiovascular regular rate and rhythm with normal S1, S2. trace pitting edema of the bilateral lower extremities. Psychiatric Patient is not able to cooperate in decision making regarding care. Patient is oriented to person only. patient is confused. Electronic Signature(s) Signed: 05/29/2018 1:28:28 AM By: Worthy Keeler PA-C Entered By: Worthy Keeler on 05/28/2018 23:51:25 Meek, Garnett [place] ([id number]) -------------------------------------------------------------------------------- Problem List Details Patient Name: Wade, Aleli P. Date of Service: 05/28/2018 9:00 AM Medical Record Number: [id number] Patient Account Number: [id number] Date of Birth/Sex: 06-28-1933 (84 y.o. F) Treating RN: Montey Hora Primary Care Provider: Emily Filbert Other Clinician: Referring Provider: Emily Filbert Treating Provider/Extender: Melburn Hake, Arora Coakley Weeks in Treatment: 9 Active Problems ICD-10 Evaluated Encounter Code Description Active Date Today Diagnosis E11.621 Type 2 diabetes mellitus with foot ulcer 03/23/2018 No Yes I89.0 Lymphedema, not elsewhere classified 03/23/2018 No Yes L89.153 Pressure ulcer of sacral region, stage 3  05/11/2018 No Yes L97.522 Non-pressure chronic ulcer of other part of left foot with fat 03/23/2018 No Yes layer exposed L89.623 Pressure ulcer of  left heel, stage 3 05/11/2018 No Yes L89.613 Pressure ulcer of right heel, stage 3 05/11/2018 No Yes I73.89 Other specified peripheral vascular diseases 03/23/2018 No Yes Inactive Problems ICD-10 Code Description Active Date Inactive Date L97.812 Non-pressure chronic ulcer of other part of right lower leg with fat 03/23/2018 03/23/2018 layer exposed L97.522 Non-pressure chronic ulcer of other part of righ foot with fat layer 03/23/2018 03/23/2018 exposed Resolved Problems ICD-10 Code Description Active Date Resolved Date L97.822 Non-pressure chronic ulcer of other part of left lower leg with fat 03/23/2018 03/23/2018 layer exposed Perz, Zena P. ([id number]) Electronic Signature(s) Signed: 05/29/2018 1:28:28 AM By: Worthy Keeler PA-C Entered By: Worthy Keeler on 05/28/2018 10:23:25 Lodato, Cynthya P. ([id number]) -------------------------------------------------------------------------------- Progress Note Details Patient Name: Adebayo, Tongela P. Date of Service: 05/28/2018 9:00 AM Medical Record Number: [id number] Patient Account Number: [id number] Date of Birth/Sex: 10/01/1933 (84 y.o. F) Treating RN: Montey Hora Primary Care Provider: Emily Filbert Other Clinician: Referring Provider: Emily Filbert Treating Provider/Extender: Melburn Hake, Shaleah Nissley Weeks in Treatment: 9 Subjective Chief Complaint Information obtained from Patient Multiple bilateral LE ulcers and sacral ulcer History of Present Illness (HPI) 03/23/18 On evaluation today patient presents for initial evaluation our clinic concerning issues that she's been having with the bilateral lower extremities. She has been out of the hospital for about a week and was there for about four days due to fluid overload. She does have congestive heart failure, atrial fibrillation, diabetes mellitus  type II, and a right first toe amputation. Currently she is having bilateral lower extremity weeping and swelling. During her time in the hospital that she took her off of her diuretic medications according to what her son is telling me today. With that being said her primary care provider, Dr. Sabra Heck, has placed her back on some of these medications according to what her son is telling me. She has undergone previous testing including hemoglobin A1c of 7.6 on 01/11/18. She's also had arterial studies which show that her right ABI was 0.89 and the left ABI of 0.94 this obviously is good as well. Currently the biggest concern with both the patient and her son are issues with overall general fluid overload and the fact that just wrapping her legs or managing the legs as we would do here in the clinic will not treat the overall fluid overload issue which I completely agree with. With that being said I think that we definitely need to try to get these areas closed as quickly as possible in order to prevent any chance for infection/cellulitis. 03/30/18 on evaluation today patient actually appears to be doing much better in regard to her lower extremity ulcers. She's been tolerating the dressing changes at this point without complication overall I do not see any signs of anything worsening which is good news. Nonetheless I am hopeful that she will continue to make good improvement she still has a lot of drainage we may want to increase her compression wrap to something a little bit stronger. 04/13/18 on evaluation today patient appears to be doing very well in regard to her lower extremity ulcers. The hill ulcers appear to be the worst but there still making good improvement overall in my pinion. Fortunately there's no evidence of active infection at this time in her swelling seems to be well controlled with the compression wrap. Overall very pleased in this regard. 04/27/18 on evaluation today patient  actually appears to be doing better in regard to the majority for wounds on evaluation today. The  one caveat is that her heels don't appear to be doing quite as well the left heel in particular is a little bit larger than previous. Fortunately there's no signs of active infection at this time. Overall she's been tolerating the dressing changes without complication. I did discuss offloading of the hills with her again today she has not been using a pillow under her legs to float the heels I think this would be of benefit for her. 05/11/18 on evaluation today patient actually appears to be doing well in regard to her lower extremities bilaterally. Unfortunately her heels are still open although they appear to be doing somewhat better as well. She does have a new ulcer in the sacral region which is something that was not needed on previous evaluation. Unfortunately when she saw vascular they also discontinued the wraps and recommended compression stockings. However neither wraps nor compression stockings have been currently utilized since she was last seen here in our clinic. She has a tremendous amount of swelling noted in the bilateral lower extremities at this point as a result and unfortunately. Nonetheless the pressure ulcer likely is due to the fact that she's been sitting in a wheelchair for an extended period of time quite frequently. 05/28/18 on evaluation today patient appears to be doing poorly upon initial presentation. In fact never even took a wraps all through the house she was acting upon arrival. According to her son she had been given a pain medication as well as potential trazodone but nonetheless her blood pressure was low today as was her pulse. Both which had me concerned. She was breathing okay and was able to respond to me although she was very lethargic. Subsequently she also appear to be very dehydrated. Nonetheless I discussed with the song that I would like to actually have her  go to the hospital for evaluation and treatment. Patient History Information obtained from Patient. Family History Hypertension - Child,Mother, Stroke - Father, No family history of Cancer, Diabetes, Kidney Disease, Lung Disease, Seizures, Thyroid Problems, Tuberculosis. ZYAN, MIRKIN ([id number]) Social History Former smoker - 30 years ago, Marital Status - Widowed, Alcohol Use - Never, Drug Use - No History, Caffeine Use - Rarely. Medical History Eyes Patient has history of Cataracts Denies history of Glaucoma, Optic Neuritis Ear/Nose/Mouth/Throat Denies history of Chronic sinus problems/congestion, Middle ear problems Hematologic/Lymphatic Patient has history of Anemia Denies history of Hemophilia, Human Immunodeficiency Virus, Lymphedema, Sickle Cell Disease Respiratory Denies history of Aspiration, Asthma, Chronic Obstructive Pulmonary Disease (COPD), Pneumothorax, Sleep Apnea, Tuberculosis Cardiovascular Patient has history of Arrhythmia - A. fib, Congestive Heart Failure, Hypertension Denies history of Angina, Hypotension, Myocardial Infarction, Peripheral Arterial Disease, Peripheral Venous Disease, Phlebitis, Vasculitis Gastrointestinal Denies history of Cirrhosis , Colitis, Crohn s, Hepatitis A, Hepatitis B, Hepatitis C Endocrine Patient has history of Type II Diabetes - oral medication Genitourinary Denies history of End Stage Renal Disease Immunological Denies history of Lupus Erythematosus, Raynaud s, Scleroderma Integumentary (Skin) Denies history of History of Burn, History of pressure wounds Musculoskeletal Patient has history of Rheumatoid Arthritis, Osteoarthritis Denies history of Gout, Osteomyelitis Neurologic Patient has history of Neuropathy Denies history of Dementia, Quadriplegia, Paraplegia, Seizure Disorder Oncologic Denies history of Received Chemotherapy, Received Radiation Psychiatric Patient has history of Anorexia/bulimia, Confinement  Anxiety Hospitalization/Surgery History - Fluid retention. Review of Systems (ROS) Constitutional Symptoms (General Health) Denies complaints or symptoms of Fatigue, Fever, Chills, Marked Weight Change. Respiratory Denies complaints or symptoms of Chronic or frequent coughs, Shortness of Breath. Cardiovascular Denies  complaints or symptoms of Chest pain, LE edema. Psychiatric Denies complaints or symptoms of Anxiety, Claustrophobia. Objective Constitutional patient is hypotensive.. heart rate depressed.. Thin and well-hydrated in no acute distress. Vitals Time Taken: 9:37 AM, Height: 65 in, Weight: 155 lbs, BMI: 25.8, Temperature: 97.6 F, Pulse: 44 bpm, Respiratory Rate: 22 breaths/min, Blood Pressure: 60/32 mmHg, Capillary Blood Glucose: 150 mg/dl, Pulse Oximetry: 96 %. NIKOLETA, DADY ([id number]) General Notes: Vannie Hilgert notified and patient is going to ED via EMS Eyes conjunctiva clear no eyelid edema noted. pupils equal round and reactive to light and accommodation. Ears, Nose, Mouth, and Throat no gross abnormality of ear auricles or external auditory canals. normal hearing noted during conversation. mucus membranes moist. Respiratory normal breathing without difficulty. clear to auscultation bilaterally. Cardiovascular regular rate and rhythm with normal S1, S2. trace pitting edema of the bilateral lower extremities. Psychiatric Patient is not able to cooperate in decision making regarding care. Patient is oriented to person only. patient is confused. Assessment Active Problems ICD-10 Type 2 diabetes mellitus with foot ulcer Lymphedema, not elsewhere classified Pressure ulcer of sacral region, stage 3 Non-pressure chronic ulcer of other part of left foot with fat layer exposed Pressure ulcer of left heel, stage 3 Pressure ulcer of right heel, stage 3 Other specified peripheral vascular diseases Plan I actually spoke with the triage nurse at [facility] to advise that I will send in this patient for further evaluation by way of emails. We also contacted EMS for transport and they did come to take her over to the hospital for further evaluation and treatment. Subsequently we will see were things stand at follow-up. At this point I did not evaluate her wounds due to the fact that I felt like it was much more important to get her to the emergency department ASAP. We will see how things stand at the next follow-up appointments. Electronic Signature(s) Signed: 05/29/2018 1:28:28 AM By: Worthy Keeler PA-C Entered By: Worthy Keeler on 05/28/2018 23:51:46 Napier, [place] ([id number]) -------------------------------------------------------------------------------- ROS/PFSH Details Patient Name: Bibbee, Amaka P. Date of Service: 05/28/2018 9:00 AM Medical Record Number: [id number] Patient Account Number: [id number] Date of Birth/Sex: [birth year] (84 y.o. F) Treating RN: Montey Hora Primary Care Provider: Emily Filbert Other Clinician: Referring Provider: Emily Filbert Treating Provider/Extender: Melburn Hake, Sonja Manseau Weeks in Treatment: 9 Information Obtained From Patient Constitutional Symptoms (General Health) Complaints and Symptoms: Negative for: Fatigue; Fever; Chills; Marked Weight Change Respiratory Complaints and Symptoms: Negative for: Chronic or frequent coughs; Shortness of Breath Medical History: Negative for: Aspiration; Asthma; Chronic Obstructive Pulmonary Disease (COPD); Pneumothorax; Sleep Apnea; Tuberculosis Cardiovascular Complaints and Symptoms: Negative for: Chest pain; LE edema Medical History: Positive for: Arrhythmia - A. fib; Congestive Heart Failure; Hypertension Negative for: Angina; Hypotension; Myocardial Infarction; Peripheral Arterial Disease; Peripheral Venous Disease; Phlebitis; Vasculitis Psychiatric Complaints and Symptoms: Negative for: Anxiety; Claustrophobia Medical History: Positive for:  Anorexia/bulimia; Confinement Anxiety Eyes Medical History: Positive for: Cataracts Negative for: Glaucoma; Optic Neuritis Ear/Nose/Mouth/Throat Medical History: Negative for: Chronic sinus problems/congestion; Middle ear problems Hematologic/Lymphatic Medical History: Positive for: Anemia Negative for: Hemophilia; Human Immunodeficiency Virus; Lymphedema; Sickle Cell Disease Gastrointestinal Medical History: Negative for: Cirrhosis ; Colitis; Crohnos; Hepatitis A; Hepatitis B; Hepatitis C Endocrine Kasprzak, Karington P. ([id number]) Medical History: Positive for: Type II Diabetes - oral medication Time with diabetes: 15 years Treated with: Oral agents Blood sugar tested every day: No Genitourinary Medical History: Negative for: End Stage Renal Disease Immunological Medical History: Negative for: Lupus Erythematosus; Raynaudos;  Scleroderma Integumentary (Skin) Medical History: Negative for: History of Burn; History of pressure wounds Musculoskeletal Medical History: Positive for: Rheumatoid Arthritis; Osteoarthritis Negative for: Gout; Osteomyelitis Neurologic Medical History: Positive for: Neuropathy Negative for: Dementia; Quadriplegia; Paraplegia; Seizure Disorder Oncologic Medical History: Negative for: Received Chemotherapy; Received Radiation HBO Extended History Items Eyes: Cataracts Immunizations Pneumococcal Vaccine: Received Pneumococcal Vaccination: Yes Implantable Devices Yes Hospitalization / Surgery History Type of Hospitalization/Surgery Fluid retention Family and Social History Cancer: No; Diabetes: No; Hypertension: Yes - Child,Mother; Kidney Disease: No; Lung Disease: No; Seizures: No; Stroke: Yes - Father; Thyroid Problems: No; Tuberculosis: No; Former smoker - 30 years ago; Marital Status - Widowed; Alcohol Use: Never; Drug Use: No History; Caffeine Use: Rarely; Financial Concerns: No; Food, Clothing or Shelter Needs: No; Support System  Lacking: No; Transportation Concerns: No Physician Affirmation I have reviewed and agree with the above information. Electronic Signature(s) Signed: 05/29/2018 1:28:28 AM By: Worthy Keeler PA-C Signed: 06/02/2018 2:38:33 PM By: Perry Mount, [place] ([id number]) Entered By: Worthy Keeler on 05/28/2018 23:48:20 Manfredi, Rilynne Mamie Nick ([id number]) -------------------------------------------------------------------------------- SuperBill Details Patient Name: Zhang, Rosemarie P. Date of Service: 05/28/2018 Medical Record Number: [id number] Patient Account Number: [id number] Date of Birth/Sex: 01/02/1934 (85 y.o. F) Treating RN: Montey Hora Primary Care Provider: Emily Filbert Other Clinician: Referring Provider: Emily Filbert Treating Provider/Extender: Melburn Hake, Montell Leopard Weeks in Treatment: 9 Diagnosis Coding ICD-10 Codes Code Description E11.621 Type 2 diabetes mellitus with foot ulcer I89.0 Lymphedema, not elsewhere classified L89.153 Pressure ulcer of sacral region, stage 3 L97.522 Non-pressure chronic ulcer of other part of left foot with fat layer exposed L89.623 Pressure ulcer of left heel, stage 3 L89.613 Pressure ulcer of right heel, stage 3 I73.89 Other specified peripheral vascular diseases Facility Procedures CPT4 Code: 11572620 Description: 35597 - WOUND CARE VISIT-LEV 2 EST PT Modifier: Quantity: 1 Physician Procedures CPT4 Code: 4163845 Description: 36468 - WC PHYS LEVEL 4 - EST PT ICD-10 Diagnosis Description E11.621 Type 2 diabetes mellitus with foot ulcer I89.0 Lymphedema, not elsewhere classified L89.153 Pressure ulcer of sacral region, stage 3 L97.522 Non-pressure chronic ulcer of  other part of left foot with fat Modifier: layer exposed Quantity: 1 Electronic Signature(s) Signed: 05/29/2018 1:28:28 AM By: Worthy Keeler PA-C Entered By: Worthy Keeler on 05/28/2018 23:52:05

## 2018-06-02 NOTE — Consult Note (Addendum)
Consultation Note Date: 06/02/2018   Patient Name: Sharon [name]  DOB: 1933-10-15  MRN: [id number]  Age / Sex: 85 y.o., female  PCP: Rusty Aus, MD Referring Physician: Nicholes Mango, MD  Reason for Consultation: Establishing goals of care  HPI/Patient Profile: Sharon [name]  is a 85 y.o. female with a known history per below, lives in nursing facility, was in the wound clinic yesterday where she was found to have blood pressure in the 60s, sent to the emergency room for further evaluation/care, patient recently started on trazodone-thought to be leading to low blood pressure as she took this in addition to Percocet, patient complaining of acute on chronic diffuse pain primarily in her back, in the emergency room patient was found to be bradycardic with heart rate in the 40s, pressure in the 60s-improved with IV fluids for rehydration. Plans for a kyphoplasty tomorrow.   Clinical Assessment and Goals of Care: Patient is resting in bed on phone. She hangs up the phone and then tries to hand it to me stating "this is cold". When asked how she is doing, she states "not very well", but cannot explain why. She is confused. She states her birthday is 2018. She cannot tell me where she is.  She states she is widowed and has 2 sons. She is retired from [employer]. She states "yes" when asked if she does well with intake, and states "both" when asked if she uses any assistive devices such as a cane, walker or wheelchair. [name] walker and wheelchair.   Called to speak with son. He states he is HPOA for his mother, and states he is not aware of any advanced directives. He states over the past year, she has had a "downward spiral" much of which is due to her legs. He states her quality of life has not been good. He states she used to use a walker, but now is unable to walk because of her legs. He states she has never been  formally diagnosed with dementia, but does have "short term memory problems" and "anxiety in the evenings." He states the confusion is worse now than usual because of being in the hospital. He states she has had issues with not wanted to get out of bed.   We broached GOC, and he immediately stated "she wants resuscitation, I can tell you that." Attempted to discuss further [place] and he stated he needed to hang up and could not talk longer.         SUMMARY OF RECOMMENDATIONS   Will attempt to reach out to son again tomorrow.    Prognosis:   Unable to determine  Discharge Planning: To Be Determined      Primary Diagnoses: Present on Admission: . Hypotension   I have reviewed the medical record, interviewed the patient and family, and examined the patient. The following aspects are pertinent.  Past Medical History:  Diagnosis Date  . Anemia   . Atrial fibrillation ([place])   . Cardiac arrest ([place])   . Cataract   .  CHF (congestive heart failure) ([address])   . Diabetes mellitus without complication ([place])   . Edema    feet/ankles occas  . Hip fracture ([place])   . Hyperlipidemia   . Hypertension   . Hypothyroid   . Neuropathy   . Osteomyelitis ([place])    left first metatarsal  . Stroke [facility])    Social History   Socioeconomic History  . Marital status: Widowed    Spouse name: Not on file  . Number of children: Not on file  . Years of education: Not on file  . Highest education level: Not on file  Occupational History  . Occupation: retired.  Social Needs  . Financial resource strain: Not hard at all  . Food insecurity:    Worry: Not on file    Inability: Not on file  . Transportation needs:    Medical: Not on file    Non-medical: Not on file  Tobacco Use  . Smoking status: Former Smoker    Packs/day: 0.50    Years: 7.00    Pack years: 3.50    Types: Cigarettes    Last attempt to quit: 05/30/1989    Years since quitting: 29.0  . Smokeless tobacco: Never Used  Substance  and Sexual Activity  . Alcohol use: No    Alcohol/week: 0.0 standard drinks  . Drug use: No  . Sexual activity: Not on file  Lifestyle  . Physical activity:    Days per week: Not on file    Minutes per session: Not on file  . Stress: Not on file  Relationships  . Social connections:    Talks on phone: Not on file    Gets together: Not on file    Attends religious service: Not on file    Active member of club or organization: Not on file    Attends meetings of clubs or organizations: Not on file    Relationship status: Not on file  Other Topics Concern  . Not on file  Social History Narrative   Lives at home by herself.   Ambulates with a walker   Family History  Problem Relation Age of Onset  . Heart attack Mother    Scheduled Meds: . acetaminophen  1,000 mg Oral QHS  . amiodarone  200 mg Oral Daily  . atorvastatin  10 mg Oral Q2200  . bisacodyl  5 mg Oral Daily  . cholecalciferol  1,000 Units Oral Daily  . collagenase   Topical Daily  . feeding supplement (NEPRO CARB STEADY)  237 mL Oral BID BM  . ferrous sulfate  325 mg Oral Daily  . furosemide  20 mg Oral Daily  . insulin aspart  0-5 Units Subcutaneous QHS  . insulin aspart  0-9 Units Subcutaneous TID WC  . insulin aspart  3 Units Subcutaneous TID WC  . insulin glargine  15 Units Subcutaneous QHS  . levothyroxine  112 mcg Oral QAC breakfast  . linaclotide  145 mcg Oral Daily  . linagliptin  5 mg Oral Daily  . metoprolol tartrate  25 mg Oral BID  . multivitamin with minerals  1 tablet Oral Daily  . nitroGLYCERIN  0.5 inch Topical Q6H  . simethicone  80 mg Oral Q6H  . sodium chloride flush  3 mL Intravenous Q12H  . vitamin C  250 mg Oral BID   Continuous Infusions: . [START ON 06/03/2018]  ceFAZolin (ANCEF) IV     PRN Meds:.albuterol, mupirocin ointment, ondansetron **OR** ondansetron (ZOFRAN) IV, oxyCODONE-acetaminophen, polyethylene  glycol, traZODone Medications Prior to Admission:  Prior to Admission  medications   Medication Sig Start Date End Date Taking? Authorizing Provider  amiodarone (PACERONE) 200 MG tablet Take 1 tablet (200 mg total) by mouth daily. 03/24/17  Yes Gladstone Lighter, MD  atorvastatin (LIPITOR) 10 MG tablet Take 10 mg by mouth daily at 10 pm.    Yes [provider]  bisacodyl (DULCOLAX) 5 MG EC tablet Take 5 mg by mouth daily. 04/26/18  Yes [provider]  cholecalciferol (VITAMIN D) 1000 units tablet Take 1,000 Units by mouth daily.   Yes [provider]  collagenase (SANTYL) ointment Apply topically daily. 03/12/18  Yes Ojie, Jude, MD  ferrous sulfate 325 (65 FE) MG tablet Take 325 mg by mouth daily.    Yes [provider]  fluconazole (DIFLUCAN) 150 MG tablet Take 150 mg by mouth daily. 05/10/18  Yes [provider]  furosemide (LASIX) 20 MG tablet Take 20 mg by mouth daily.  03/15/18  Yes [provider]  gabapentin (NEURONTIN) 300 MG capsule Take 300 mg by mouth 3 (three) times daily. 04/24/18  Yes [provider]  insulin aspart (NOVOLOG) 100 UNIT/ML injection Inject 4-8 Units into the skin 3 (three) times daily. 04/24/18  Yes [provider]  insulin glargine (LANTUS) 100 UNIT/ML injection Inject 15 Units into the skin at bedtime. 04/24/18 04/24/19 Yes [provider]  JANUVIA 50 MG tablet Take 50 mg by mouth daily. Take along with Amaryl 02/15/18  Yes [provider]  levothyroxine (SYNTHROID, LEVOTHROID) 112 MCG tablet Take 112 mcg by mouth daily before breakfast. 30 minutes before breakfast   Yes [provider]  linaclotide (LINZESS) 145 MCG CAPS capsule Take 145 mcg by mouth daily. 05/11/18  Yes [provider]  metoprolol tartrate (LOPRESSOR) 25 MG tablet Take 1 tablet (25 mg total) by mouth 2 (two) times daily. 04/21/17  Yes Pyreddy, Reatha Harps, MD  potassium chloride SA (K-DUR) 20 MEQ tablet Take 20 mEq by mouth daily. 05/03/18  Yes [provider]  simethicone  (MYLICON) 80 MG chewable tablet Chew 80 mg by mouth every 6 (six) hours. 05/11/18  Yes [provider]  traZODone (DESYREL) 50 MG tablet Take 50 mg by mouth at bedtime. For anxiety 06/24/17 06/24/18 Yes [provider]  acetaminophen (TYLENOL) 500 MG tablet Take 1,000 mg by mouth at bedtime. For pain management    [provider]  acetaminophen (TYLENOL) 650 MG CR tablet Take 650 mg by mouth every 8 (eight) hours as needed. For discomfort    [provider]  Amino Acids-Protein Hydrolys (FEEDING SUPPLEMENT, PRO-STAT SUGAR FREE 64,) LIQD Take 30 mLs by mouth 2 (two) times daily between meals.    [provider]  GLUCERNA (GLUCERNA) LIQD Take 237 mLs by mouth 2 (two) times daily between meals.    [provider]  HYDROcodone-acetaminophen (NORCO/VICODIN) 5-325 MG tablet Take 1 tablet by mouth 3 (three) times daily. 04/28/18   [provider]  mupirocin ointment (BACTROBAN) 2 % Place 1 application into the nose 2 (two) times daily.    [provider]  oxyCODONE-acetaminophen (PERCOCET/ROXICET) 5-325 MG tablet Take 1 tablet by mouth every 4 (four) hours as needed. 05/24/18   [provider]  traMADol (ULTRAM) 50 MG tablet Take 1 tablet (50 mg total) by mouth every 6 (six) hours as needed for moderate pain. 03/27/18   Paulette Blanch, MD   Allergies  Allergen Reactions  . Carbamazepine Other (See Comments)  Reaction: unknown  . Cymbalta [Duloxetine Hcl] Other (See Comments)    Confusion, disorientation  . Duloxetine     Other reaction(s): Other (See Comments) Confusion, disorientation  . Lyrica [Pregabalin] Other (See Comments)    Patient and son states this makes the patient confused.  . Temazepam     Other reaction(s): Hallucination   Review of Systems  All other systems reviewed and are negative.   Physical Exam Pulmonary:     Effort: Pulmonary effort is normal.  Neurological:     Mental Status: She is alert.      Comments: Confused     Vital Signs: BP 96/61 (BP Location: Left Arm)   Pulse (!) 55   Temp 97.8 F (36.6 C) (Oral)   Resp 16   Ht 5\' 5"  (1.651 m)   Wt 55.9 kg   SpO2 95%   BMI 20.50 kg/m  Pain Scale: 0-10 POSS *See Group Information*: S-Acceptable,Sleep, easy to arouse Pain Score: 0-No pain   SpO2: SpO2: 95 % O2 Device:SpO2: 95 % O2 Flow Rate: .   IO: Intake/output summary:   Intake/Output Summary (Last 24 hours) at 06/02/2018 1321 Last data filed at 06/02/2018 1014 Gross per 24 hour  Intake 483 ml  Output 0 ml  Net 483 ml    LBM: Last BM Date: 05/31/18 Baseline Weight: Weight: 70 kg Most recent weight: Weight: 55.9 kg     Palliative Assessment/Data:     Time In: 12:50 Time Out: 1:40 Time Total: 50 min Greater than 50%  of this time was spent counseling and coordinating care related to the above assessment and plan.  Signed by: Asencion Gowda, NP   Please contact Palliative Medicine Team phone at [phone] for questions and concerns.  For individual provider: See Shea Evans

## 2018-06-02 NOTE — Progress Notes (Signed)
In to give pt medication, pt stating that she is not going to take medicine. Rn attempting to let pt know that her son is on the phone. In the beginning she did not want to talk to him, but then started talking. Pt at times tearful, and then decided to take medications and also would like something for her back pain. At this time pt cooperative with medications, changing her gown, repositioning.

## 2018-06-02 NOTE — Progress Notes (Signed)
PT Cancellation Note  Patient Details Name: Sharon [name] MRN: [id number] DOB: 1933/04/09   Cancelled Treatment:    Reason Eval/Treat Not Completed: Other (comment);Patient not medically ready(Hold per physician until s/p kyphoplasty. Will follow patient closely and will attempt again at later time/date. )  Janna Arch, PT, DPT   06/02/2018, 8:36 AM

## 2018-06-02 NOTE — TOC Progression Note (Signed)
Transition of Care [facility]) - Progression Note    Patient Details  Name: Sharon [name] MRN: [id number] Date of Birth: 1933-08-20  [facility]) CM/SW Contact  Elza Rafter, RN Phone Number: 06/02/2018, 10:33 AM  Clinical Narrative:  Late Entry-06-01-18 Call from Dr. Amalia Hailey; medical director of [facility].  He is following patient and states he is concerned about her multiple readmissions, advanced dementia and living in a ALF.   Feels ALF is not appropriate for her.  Asked MD for palliative consult and PT evaluation.    Expected Discharge Plan: Home/Self Care Barriers to Discharge: Continued Medical Work up  Expected Discharge Plan and Services Expected Discharge Plan: Home/Self Care   Discharge Planning Services: CM Consult   Living arrangements for the past 2 months: Single Family Home                                       Social Determinants of Health (SDOH) Interventions    Readmission Risk Interventions Readmission Risk Prevention Plan 05/29/2018  Transportation Screening Complete  Medication Review Press photographer) Complete  [place] Not Applicable  Some recent data might be hidden

## 2018-06-03 ENCOUNTER — Encounter
Admission: EM | Disposition: A | Discharge status: Skilled Nursing Facility | Payer: Self-pay | Source: Skilled Nursing Facility | Attending: Internal Medicine

## 2018-06-03 ENCOUNTER — Inpatient Hospital Stay: Payer: PPO | Admitting: Anesthesiology

## 2018-06-03 ENCOUNTER — Inpatient Hospital Stay: Payer: PPO

## 2018-06-03 ENCOUNTER — Encounter: Payer: Self-pay | Admitting: *Deleted

## 2018-06-03 HISTORY — PX: KYPHOPLASTY: SHX5884

## 2018-06-03 LAB — GLUCOSE, CAPILLARY
Glucose-Capillary: 131 mg/dL — ABNORMAL HIGH (ref 70–99)
Glucose-Capillary: 147 mg/dL — ABNORMAL HIGH (ref 70–99)
Glucose-Capillary: 133 mg/dL — ABNORMAL HIGH (ref 70–99)
Glucose-Capillary: 100 mg/dL — ABNORMAL HIGH (ref 70–99)
Glucose-Capillary: 126 mg/dL — ABNORMAL HIGH (ref 70–99)

## 2018-06-03 LAB — POTASSIUM: Potassium: 4.3 mmol/L (ref 3.5–5.1)

## 2018-06-03 LAB — SARS CORONAVIRUS 2 BY RT PCR (HOSPITAL ORDER, PERFORMED IN ~~LOC~~ HOSPITAL LAB): SARS Coronavirus 2: NEGATIVE

## 2018-06-03 SURGERY — KYPHOPLASTY
Anesthesia: Monitor Anesthesia Care | Site: Back

## 2018-06-03 MED ORDER — MIDAZOLAM HCL 2 MG/2ML IJ SOLN
INTRAMUSCULAR | Status: AC
  Filled 2018-06-03: qty 2

## 2018-06-03 MED ORDER — ONDANSETRON HCL 4 MG PO TABS: 4.0000 mg | ORAL_TABLET | Freq: Four times a day (QID) | ORAL | 0 refills | Status: DC | PRN

## 2018-06-03 MED ORDER — HYDROCODONE-ACETAMINOPHEN 5-325 MG PO TABS: 1.0000 | ORAL_TABLET | Freq: Three times a day (TID) | ORAL | 0 refills | Status: AC

## 2018-06-03 MED ORDER — FENTANYL CITRATE (PF) 100 MCG/2ML IJ SOLN: 25.0000 ug | INTRAMUSCULAR | Status: DC | PRN

## 2018-06-03 MED ORDER — ASCORBIC ACID 250 MG PO TABS: 250.0000 mg | ORAL_TABLET | Freq: Two times a day (BID) | ORAL | 0 refills | Status: DC

## 2018-06-03 MED ORDER — MAGNESIUM HYDROXIDE 400 MG/5ML PO SUSP
30.0000 mL | Freq: Every day | ORAL | Status: DC | PRN
  Filled 2018-06-03: qty 30

## 2018-06-03 MED ORDER — METOCLOPRAMIDE HCL 5 MG/ML IJ SOLN: 5.0000 mg | Freq: Three times a day (TID) | INTRAMUSCULAR | Status: DC | PRN

## 2018-06-03 MED ORDER — MAGNESIUM CITRATE PO SOLN
1.0000 | Freq: Once | ORAL | Status: DC | PRN
  Filled 2018-06-03: qty 296

## 2018-06-03 MED ORDER — DOCUSATE SODIUM 100 MG PO CAPS: 100.0000 mg | ORAL_CAPSULE | Freq: Two times a day (BID) | ORAL | 0 refills | Status: DC | PRN

## 2018-06-03 MED ORDER — KETAMINE HCL 50 MG/ML IJ SOLN
INTRAMUSCULAR | Status: AC
  Filled 2018-06-03: qty 10

## 2018-06-03 MED ORDER — ALBUTEROL SULFATE (2.5 MG/3ML) 0.083% IN NEBU: 2.5000 mg | INHALATION_SOLUTION | Freq: Four times a day (QID) | RESPIRATORY_TRACT | 12 refills | Status: AC | PRN

## 2018-06-03 MED ORDER — ONDANSETRON HCL 4 MG/2ML IJ SOLN: 4.0000 mg | Freq: Once | INTRAMUSCULAR | Status: DC | PRN

## 2018-06-03 MED ORDER — MIDAZOLAM HCL 2 MG/2ML IJ SOLN
INTRAMUSCULAR | Status: DC | PRN
  Administered 2018-06-03: 1 mg via INTRAVENOUS

## 2018-06-03 MED ORDER — FLUCONAZOLE 150 MG PO TABS: 150.0000 mg | ORAL_TABLET | Freq: Every day | ORAL | 0 refills | Status: DC

## 2018-06-03 MED ORDER — BUPIVACAINE-EPINEPHRINE (PF) 0.5% -1:200000 IJ SOLN
INTRAMUSCULAR | Status: DC | PRN
  Administered 2018-06-03: 10 mL via PERINEURAL

## 2018-06-03 MED ORDER — BISACODYL 5 MG PO TBEC
5.0000 mg | DELAYED_RELEASE_TABLET | Freq: Every day | ORAL | Status: DC | PRN
  Filled 2018-06-03: qty 1

## 2018-06-03 MED ORDER — KETAMINE HCL 50 MG/ML IJ SOLN
INTRAMUSCULAR | Status: DC | PRN
  Administered 2018-06-03: 50 mg via INTRAMUSCULAR

## 2018-06-03 MED ORDER — OXYCODONE-ACETAMINOPHEN 5-325 MG PO TABS: 1.0000 | ORAL_TABLET | Freq: Four times a day (QID) | ORAL | 0 refills | Status: DC | PRN

## 2018-06-03 MED ORDER — INSULIN GLARGINE 100 UNIT/ML ~~LOC~~ SOLN
5.0000 [IU] | Freq: Once | SUBCUTANEOUS | Status: DC
  Filled 2018-06-03: qty 0.05

## 2018-06-03 MED ORDER — PROPOFOL 10 MG/ML IV BOLUS
INTRAVENOUS | Status: AC
  Filled 2018-06-03: qty 20

## 2018-06-03 MED ORDER — PROPOFOL 10 MG/ML IV BOLUS
INTRAVENOUS | Status: DC | PRN
  Administered 2018-06-03 (×2): 20 mg via INTRAVENOUS

## 2018-06-03 MED ORDER — IOPAMIDOL (ISOVUE-M 200) INJECTION 41%: INTRAMUSCULAR | Status: DC | PRN

## 2018-06-03 MED ORDER — IOHEXOL 180 MG/ML  SOLN
INTRAMUSCULAR | Status: DC | PRN
  Administered 2018-06-03: 40 mL

## 2018-06-03 MED ORDER — DOCUSATE SODIUM 100 MG PO CAPS
100.0000 mg | ORAL_CAPSULE | Freq: Two times a day (BID) | ORAL | Status: DC
  Administered 2018-06-03: 100 mg via ORAL
  Filled 2018-06-03 (×3): qty 1

## 2018-06-03 MED ORDER — TRAMADOL HCL 50 MG PO TABS: 50.0000 mg | ORAL_TABLET | Freq: Four times a day (QID) | ORAL | 0 refills | Status: DC | PRN

## 2018-06-03 MED ORDER — LIDOCAINE HCL 1 % IJ SOLN
INTRAMUSCULAR | Status: DC | PRN
  Administered 2018-06-03 (×2): 10 mL

## 2018-06-03 MED ORDER — SODIUM CHLORIDE 0.9 % IV SOLN
INTRAVENOUS | Status: DC | PRN
  Administered 2018-06-03: 08:00:00 via INTRAVENOUS

## 2018-06-03 MED ORDER — ADULT MULTIVITAMIN W/MINERALS CH: 1.0000 | ORAL_TABLET | Freq: Every day | ORAL | 0 refills | Status: AC

## 2018-06-03 MED ORDER — METOCLOPRAMIDE HCL 10 MG PO TABS: 5.0000 mg | ORAL_TABLET | Freq: Three times a day (TID) | ORAL | Status: DC | PRN

## 2018-06-03 SURGICAL SUPPLY — 19 items
CEMENT KYPHON CX01A KIT/MIXER (Cement) ×3 IMPLANT
COVER WAND RF STERILE (DRAPES) ×3 IMPLANT
DERMABOND ADVANCED (GAUZE/BANDAGES/DRESSINGS) ×2
DERMABOND ADVANCED .7 DNX12 (GAUZE/BANDAGES/DRESSINGS) ×1 IMPLANT
DEVICE BIOPSY BONE KYPHX (INSTRUMENTS) ×3 IMPLANT
DRAPE C-ARM XRAY 36X54 (DRAPES) ×3 IMPLANT
DURAPREP 26ML APPLICATOR (WOUND CARE) ×3 IMPLANT
GLOVE SURG SYN 9.0  PF PI (GLOVE) ×2
GLOVE SURG SYN 9.0 PF PI (GLOVE) ×1 IMPLANT
GOWN SRG 2XL LVL 4 RGLN SLV (GOWNS) ×1 IMPLANT
GOWN STRL NON-REIN 2XL LVL4 (GOWNS) ×2
GOWN STRL REUS W/ TWL LRG LVL3 (GOWN DISPOSABLE) ×1 IMPLANT
GOWN STRL REUS W/TWL LRG LVL3 (GOWN DISPOSABLE) ×2
PACK KYPHOPLASTY (MISCELLANEOUS) ×3 IMPLANT
RENTAL RFA  GENERATOR (MISCELLANEOUS)
RENTAL RFA GENERATOR (MISCELLANEOUS) IMPLANT
STRAP SAFETY 5IN WIDE (MISCELLANEOUS) ×3 IMPLANT
TRAY KYPHOPAK 15/3 EXPRESS 1ST (MISCELLANEOUS) ×3 IMPLANT
TRAY KYPHOPAK 20/3 EXPRESS 1ST (MISCELLANEOUS) IMPLANT

## 2018-06-03 NOTE — TOC Progression Note (Signed)
Transition of Care [facility]) - Progression Note    Patient Details  Name: Sharon [name] MRN: [id number] Date of Birth: 26-Mar-1933  Transition of Care [facility]) CM/SW Contact  Sharon Rafter, RN Phone Number: 06/03/2018, 3:35 PM  Clinical Narrative:   Sharon [name] with Sharon [name] from [place]; they are unable to accept patient per therapy notes.  Sharon [name] notified son Sharon [name].  Son Sharon [name] [phone] and I discussed options.  He would prefer [facility] for [facility] and then hopefully return to [place].  Notified Sharon [name] with Sharon [name] of patient preference.  She is going to look over chart and review with DOA.  Notified Sharon [name] he would need to pay $280.00 upfront because patient has [facility] and that is their agreement.  He states that will not be a problem.     Expected Discharge Plan: Home/Self Care Barriers to Discharge: Continued Medical Work up  Expected Discharge Plan and Services Expected Discharge Plan: Home/Self Care   Discharge Planning Services: CM Consult   Living arrangements for the past 2 months: Single Family Home Expected Discharge Date: 06/03/18                                     Social Determinants of Health (SDOH) Interventions    Readmission Risk Interventions Readmission Risk Prevention Plan 05/29/2018  Transportation Screening Complete  Medication Review Press photographer) Complete  Palliative Care Screening Not [place] Not Applicable  Some recent data might be hidden

## 2018-06-03 NOTE — TOC Progression Note (Signed)
Transition of Care [facility]) - Progression Note    Patient Details  Name: Sharon [name] MRN: [id number] Date of Birth: 06/17/1933  Transition of Care [facility]) CM/SW Contact  Elza Rafter, RN Phone Number: 06/03/2018, 1:57 PM  Clinical Narrative:   Sharon [name] with Sharon [name] at [facility] and they can take patient back today.  Faxed therapy notes and H&P as requested.     Expected Discharge Plan: Home/Self Care Barriers to Discharge: Continued Medical Work up  Expected Discharge Plan and Services Expected Discharge Plan: Home/Self Care   Discharge Planning Services: CM Consult   Living arrangements for the past 2 months: Single Family Home Expected Discharge Date: 06/03/18                                     Social Determinants of Health (SDOH) Interventions    Readmission Risk Interventions Readmission Risk Prevention Plan 05/29/2018  Transportation Screening Complete  Medication Review Press photographer) Complete  Palliative Care Screening Not [place] Not Applicable  Some recent data might be hidden

## 2018-06-03 NOTE — Anesthesia Post-op Follow-up Note (Signed)
Anesthesia QCDR form completed.        

## 2018-06-03 NOTE — TOC Progression Note (Signed)
Transition of Care [facility]) - Progression Note    Patient Details  Name: Sharon [name] MRN: [id number] Date of Birth: 11-Oct-1933  Transition of Care [facility]) CM/SW Contact  Elza Rafter, RN Phone Number: 06/03/2018, 12:01 PM  Clinical Narrative:   FL2 complete and sent out for bed offers.  Per Dr. Margaretmary Eddy son wants a higher level of care.  Will continue to assist with discharge planning.      Expected Discharge Plan: Home/Self Care Barriers to Discharge: Continued Medical Work up  Expected Discharge Plan and Services Expected Discharge Plan: Home/Self Care   Discharge Planning Services: CM Consult   Living arrangements for the past 2 months: Single Family Home                                       Social Determinants of Health (SDOH) Interventions    Readmission Risk Interventions Readmission Risk Prevention Plan 05/29/2018  Transportation Screening Complete  Medication Review Press photographer) Complete  [place] Not Applicable  Some recent data might be hidden

## 2018-06-03 NOTE — Progress Notes (Signed)
Son, Sharon [name] concerned about mother. With the pt's orientation, the son is wondering should they proceed with the surgery. The son would like the doctor to call him before proceeding with surgery this morning. Will pass on to morning shift.

## 2018-06-03 NOTE — Op Note (Signed)
Date  Jun 03, 2018  time 9:10 AM   PATIENT:  Sharon [name]   PRE-OPERATIVE DIAGNOSIS:  closed wedge compression fracture of L1   POST-OPERATIVE DIAGNOSIS:  closed wedge compression fracture of L1   PROCEDURE:  Procedure(s): KYPHOPLASTY L1  SURGEON: Laurene Footman, MD   ASSISTANTS: None   ANESTHESIA:   local and MAC   EBL:  No intake/output data recorded.   BLOOD ADMINISTERED:none   DRAINS: none    LOCAL MEDICATIONS USED:  MARCAINE    and XYLOCAINE    SPECIMEN:   None   DISPOSITION OF SPECIMEN:  Not applicable   COUNTS:  YES   TOURNIQUET:  * No tourniquets in log *   IMPLANTS: Bone cement   DICTATION: .Dragon Dictation  patient was brought to the operating room and after adequate anesthesia was obtained the patient was placed prone.  C arm was brought in in good visualization of the affected level obtained on both AP and lateral projections.  After patient identification and timeout procedures were completed, local anesthetic was infiltrated with 10 cc 1% Xylocaine infiltrated subcutaneously.  This is done the area on the right side of the planned approach.  The back was then prepped and draped in the usual sterile manner and repeat timeout procedure carried out.  A spinal needle was brought down to the pedicle on the right side of  L1 and a 50-50 mix of 1% Xylocaine half percent Sensorcaine with epinephrine total of 20 cc injected.  After allowing this to set a small incision was made and the trocar was advanced into the vertebral body in an extrapedicular fashion.  Biopsy was not obtained Drilling was carried out balloon inserted with inflation to  for cc.  When the cement was appropriate consistency 6 cc were injected into the vertebral body without extravasation, good fill superior to inferior endplates and from right to left sides along the inferior endplate.  After the cement had set the trochar was removed and permanent C-arm views obtained.  The wound was closed with  Dermabond followed by Band-Aid   PLAN OF CARE:  Continue as inpatient   PATIENT DISPOSITION:  PACU - hemodynamically stable.

## 2018-06-03 NOTE — TOC Progression Note (Signed)
Transition of Care [facility]) - Progression Note    Patient Details  Name: Sharon [name] MRN: [id number] Date of Birth: Jun 17, 1933  Transition of Care [facility]) CM/SW Contact  Elza Rafter, RN Phone Number: 06/03/2018, 12:22 PM  Clinical Narrative:   [place]en Skains and spoke with Doran Clay about patient discharging to [facility] as MD stated that he told her he wanted a higher level of care.   When I introduced myself over the phone he abruptly interrupted me and raised his voice asking why she can't go back to [place].  I tried to explain that I was calling to ask him some questions about what he told MD about STR and get her worked up.  He states he never told MD he wanted that.  He wants her to go back to [place].  Called and spoke to [place] at [facility]; they can take her back today.  Will need a rapid COVID as her last swab was on the 15th.  MD ordered.       Expected Discharge Plan: Home/Self Care Barriers to Discharge: Continued Medical Work up  Expected Discharge Plan and Services Expected Discharge Plan: Home/Self Care   Discharge Planning Services: CM Consult   Living arrangements for the past 2 months: Single Family Home                                       Social Determinants of Health (SDOH) Interventions    Readmission Risk Interventions Readmission Risk Prevention Plan 05/29/2018  Transportation Screening Complete  Medication Review Press photographer) Complete  [place] Not Applicable  Some recent data might be hidden

## 2018-06-03 NOTE — Progress Notes (Signed)
Unable to give CHG bath

## 2018-06-03 NOTE — Anesthesia Preprocedure Evaluation (Signed)
Anesthesia Evaluation  Patient identified by MRN, date of birth, ID band Patient awake    Reviewed: Allergy & Precautions, H&P , NPO status , Patient's Chart, lab work & pertinent test results, reviewed documented beta blocker date and time   Airway Mallampati: II  TM Distance: >3 FB Neck ROM: full    Dental no notable dental hx. (+) Teeth Intact   Pulmonary neg pulmonary ROS, former smoker,    Pulmonary exam normal breath sounds clear to auscultation       Cardiovascular Exercise Tolerance: Poor hypertension, On Medications + Peripheral Vascular Disease and +CHF   Rhythm:regular Rate:Normal     Neuro/Psych CVA negative psych ROS   GI/Hepatic negative GI ROS, Neg liver ROS,   Endo/Other  diabetesHypothyroidism   Renal/GU Renal disease     Musculoskeletal   Abdominal   Peds  Hematology  (+) Blood dyscrasia, anemia ,   Anesthesia Other Findings   Reproductive/Obstetrics negative OB ROS                             Anesthesia Physical Anesthesia Plan  ASA: II  Anesthesia Plan: MAC   Post-op Pain Management:    Induction:   PONV Risk Score and Plan:   Airway Management Planned:   Additional Equipment:   Intra-op Plan:   Post-operative Plan:   Informed Consent: I have reviewed the patients History and Physical, chart, labs and discussed the procedure including the risks, benefits and alternatives for the proposed anesthesia with the patient or authorized representative who has indicated his/her understanding and acceptance.       Plan Discussed with: CRNA  Anesthesia Plan Comments:         Anesthesia Quick Evaluation

## 2018-06-03 NOTE — Evaluation (Signed)
Physical Therapy Evaluation Patient Details Name: Sharon [name] MRN: [id number] DOB: 01-19-1933 Today's Date: 06/03/2018   History of Present Illness  Patient is a pleasantly confused 85 year old female with a known history of a fib, DM, PVD, right first toe ray amputation (2019), HLD, CVA, CKD, sacral ulcers, neuropathy, osteomyelitis, stroke,  and lymphedema. She presented to the ED from her ALF [facility] for hypotension and recieved a kyphoplasty on 5/21 for L1. Patient is not a good historian requiring history to be obtained from son and previous documentation.   Clinical Impression  Patient is a confused 85 year old female who presents with generalized weakness and limited ability to mobilize self. Per patient's son and previous documentation patient lives at [facility] and required assistance for transfers to wheelchair and ADL's. Patient was able to help with transfer however and was not dependent per previous documentation. Patient at this time is dependent with transfers and is unable to maintain EOB position without Max A. Patient will benefit from skilled physical therapy while in the hospital to improve strength, transfers, and maintain spinal precautions via log roll. Patient will benefit from SNF placement upon discharge with a focus on memory care due to decreased ability to transfer self, perform any self mobilization, and for decreased fall risk for improved quality of life.     Follow Up Recommendations SNF;Other (comment)(memory care)    Equipment Recommendations  None recommended by PT    Recommendations for Other Services       Precautions / Restrictions Precautions Precautions: Fall Restrictions Weight Bearing Restrictions: Yes Other Position/Activity Restrictions: WBAT per [facility]      Mobility  Bed Mobility Overal bed mobility: Needs Assistance Bed Mobility: Supine to Sit;Sit to Supine     Supine to sit: Max assist;HOB elevated Sit to supine: Max assist;HOB  elevated   General bed mobility comments: Patient requires max verbal cueing/ sequencing with task orientation, Attempted education on back protocol : log rolling: requires assistance with UE's, LE's and trunk, unable to maintain EOB support   Transfers                 General transfer comment: unable to safely perform   Ambulation/Gait             General Gait Details: unable to safely perform   Stairs            Wheelchair Mobility    Modified Rankin (Stroke Patients Only)       Balance Overall balance assessment: Needs assistance Sitting-balance support: Bilateral upper extremity supported;Feet supported Sitting balance-Leahy Scale: Zero Sitting balance - Comments: Requires Max support to maintain sitting EOB position  Postural control: Posterior lean;Right lateral lean                     High Level Balance Comments: unable to attempt             Pertinent Vitals/Pain Pain Assessment: Faces Faces Pain Scale: Hurts little more Pain Location: with movement Pain Descriptors / Indicators: Aching;Discomfort Pain Intervention(s): Limited activity within patient's tolerance;Repositioned;Monitored during session    Home Living Family/patient expects to be discharged to:: Skilled nursing facility                 Additional Comments: Patient lives at [facility]     Prior Function Level of Independence: Needs assistance   Gait / Transfers Assistance Needed: Patient transfers with assistance(minA) from bed <>wheelchair, non ambulatory   ADL's / Homemaking  Assistance Needed: needs assistance with ADLs  Comments: Per son and previous documentation patient lives at [facility] where she is assisted with ADLs, needed assistance with transfers to/from wheelchair, moved around in wheelchair     Hand Dominance        Extremity/Trunk Assessment   Upper Extremity Assessment Upper Extremity Assessment: Generalized weakness;Difficult to  assess due to impaired cognition    Lower Extremity Assessment Lower Extremity Assessment: Difficult to assess due to impaired cognition(history of neuropathy, limited ability to follow commands, grossly 3-/5 )    Cervical / Trunk Assessment Cervical / Trunk Assessment: (s/p L1 kyphoplasty )  Communication   Communication: No difficulties  Cognition Arousal/Alertness: Lethargic Behavior During Therapy: Anxious;Flat affect Overall Cognitive Status: No family/caregiver present to determine baseline cognitive functioning                                 General Comments: Patient oriented to self but nothing else. Requires max cueing for sequencing and orientation.       General Comments General comments (skin integrity, edema, etc.): unna boots/wraps on BLE, wounds on toes of L foot and amputation of R first ray     Exercises Other Exercises Other Exercises: attempted education on spinal precautions/log rolling: patient not cogntively aware enough for understanding.    Assessment/Plan    PT Assessment Patient needs continued PT services  PT Problem List Decreased strength;Decreased range of motion;Decreased activity tolerance;Decreased coordination;Decreased mobility;Decreased balance;Decreased cognition;Decreased knowledge of use of DME;Decreased safety awareness;Impaired sensation;Decreased knowledge of precautions;Pain       PT Treatment Interventions DME instruction;Functional mobility training;Therapeutic activities;Therapeutic exercise;Neuromuscular re-education;Balance training;Cognitive remediation;Patient/family education;Wheelchair mobility training;Manual techniques    PT Goals (Current goals can be found in the Care Plan section)  Acute Rehab PT Goals PT Goal Formulation: Patient unable to participate in goal setting    Frequency Min 2X/week   Barriers to discharge Decreased caregiver support Would benefit from SNF and memory care    Co-evaluation                AM-PAC PT "6 Clicks" Mobility  Outcome Measure Help needed turning from your back to your side while in a flat bed without using bedrails?: A Lot Help needed moving from lying on your back to sitting on the side of a flat bed without using bedrails?: A Lot Help needed moving to and from a bed to a chair (including a wheelchair)?: Total Help needed standing up from a chair using your arms (e.g., wheelchair or bedside chair)?: Total Help needed to walk in hospital room?: Total Help needed climbing 3-5 steps with a railing? : Total 6 Click Score: 8    End of Session   Activity Tolerance: Patient limited by fatigue;Patient limited by lethargy;Other (comment)(cognitive levels) Patient left: in bed;with bed alarm set;with nursing/sitter in room Nurse Communication: Mobility status;Other (comment)(calling son) PT Visit Diagnosis: Other abnormalities of gait and mobility (R26.89);Muscle weakness (generalized) (M62.81);Difficulty in walking, not elsewhere classified (R26.2);Adult, failure to thrive (R62.7);Pain Pain - part of body: (back)    Time: 6222-9798 PT Time Calculation (min) (ACUTE ONLY): 16 min   Charges:   PT Evaluation $PT Eval High Complexity: 1 High PT Treatments $Therapeutic Activity: 8-22 mins        Janna Arch, PT, DPT    Janna Arch 06/03/2018, 12:53 PM

## 2018-06-03 NOTE — NC FL2 (Addendum)
[place] LEVEL OF CARE SCREENING TOOL     IDENTIFICATION  Patient Name: Sharon [name] Birthdate: Jan 29, 1933 Sex: female Admission Date (Current Location): 05/28/2018  [place] and [place] Number:  Engineering geologist and Address:  [facility], [address] [place], [place] [address]      Provider Number: [id number]  Attending Physician Name and Address:  Nicholes Mango, MD  Relative Name and Phone Number:  Ishia, Tenorio [phone]     Current Level of Care: Hospital Recommended Level of Care: [place] Prior Approval Number:    Date Approved/Denied:   PASRR Number:  [id number]  Discharge Plan: SNF    Current Diagnoses: Patient Active Problem List   Diagnosis Date Noted  . Hypotension 05/28/2018  . Lymphedema 03/24/2018  . Pressure injury of skin 03/12/2018  . Atherosclerosis of native arteries of the extremities with ulceration ([place]) 05/07/2017  . Non-healing ulcer ([place]) 04/16/2017  . Diabetic neuropathy ([place]) 04/07/2017  . Septic shock ([place]) 03/20/2017  . CKD (chronic kidney disease), stage III ([place]) 03/19/2017  . Goals of care, counseling/discussion 03/03/2017  . PAD (peripheral artery disease) ([place]) 02/03/2017  . Abdominal aortic stenosis 02/03/2017  . Bilateral lower extremity edema 02/03/2017  . Anemia 01/29/2017  . B12 deficiency 01/29/2017  . Pancreatic mass 01/29/2017  . Hyponatremia 07/07/2015  . Foot ulcer ([place]) 02/07/2015  . Acute on chronic renal failure ([place]) 11/05/2014  . Diabetic foot infection ([place]) 10/10/2014  . Chronic diastolic heart failure ([place]) 05/31/2014  . HTN (hypertension) 05/31/2014  . DM (diabetes mellitus), type 2, uncontrolled ([place]) 05/31/2014    Orientation RESPIRATION BLADDER Height & Weight     Self  Normal Incontinent Weight: 56.6 kg Height:  5\' 5"  (165.1 cm)  BEHAVIORAL SYMPTOMS/MOOD NEUROLOGICAL BOWEL NUTRITION STATUS       Incontinent Diet(regular)  AMBULATORY STATUS COMMUNICATION OF NEEDS Skin Sacral stage II   Limited Assist Verbally Normal                       Personal Care Assistance Level of Assistance  Bathing, Feeding, Dressing Bathing Assistance: Limited assistance Feeding assistance: Independent Dressing Assistance: Limited assistance     Functional Limitations Info  Sight, Hearing, Speech Sight Info: Adequate Hearing Info: Adequate Speech Info: Adequate    SPECIAL CARE FACTORS FREQUENCY  PT (By licensed PT)     PT Frequency: 5 X week              Contractures Contractures Info: Not present    Additional Factors Info  Code Status, Allergies, Psychotropic Code Status Info: full Allergies Info: Carbamazepine, Cymbalta Duloxetine Hcl, Duloxetine, Lyrica Pregabalin, Temazepam Psychotropic Info: Seroquel          Discharge Medications: Please see discharge summary for a list of discharge medications.  Medication List    TAKE these medications   acetaminophen 500 MG tablet Commonly known as:  TYLENOL Take 1,000 mg by mouth at bedtime. For pain management   acetaminophen 650 MG CR tablet Commonly known as:  TYLENOL Take 650 mg by mouth every 8 (eight) hours as needed. For discomfort   albuterol (2.5 MG/3ML) 0.083% nebulizer solution Commonly known as:  PROVENTIL Take 3 mLs (2.5 mg total) by nebulization every 6 (six) hours as needed for wheezing or shortness of breath.   amiodarone 200 MG tablet Commonly known as:  PACERONE Take 1 tablet (200 mg total) by mouth daily.   ascorbic acid 250 MG  tablet Commonly known as:  VITAMIN C Take 1 tablet (250 mg total) by mouth 2 (two) times daily.   atorvastatin 10 MG tablet Commonly known as:  LIPITOR Take 10 mg by mouth daily at 10 pm.   bisacodyl 5 MG EC tablet Commonly known as:  DULCOLAX Take 5 mg by mouth daily.   cholecalciferol 1000 units tablet Commonly known as:  VITAMIN D Take 1,000 Units by  mouth daily.   collagenase ointment Commonly known as:  SANTYL Apply topically daily.   docusate sodium 100 MG capsule Commonly known as:  COLACE Take 1 capsule (100 mg total) by mouth 2 (two) times daily as needed for mild constipation.   feeding supplement (PRO-STAT SUGAR FREE 64) Liqd Take 30 mLs by mouth 2 (two) times daily between meals.   ferrous sulfate 325 (65 FE) MG tablet Take 325 mg by mouth daily.   fluconazole 150 MG tablet Commonly known as:  DIFLUCAN Take 1 tablet (150 mg total) by mouth daily.   furosemide 20 MG tablet Commonly known as:  LASIX Take 20 mg by mouth daily.   gabapentin 300 MG capsule Commonly known as:  NEURONTIN Take 300 mg by mouth 3 (three) times daily.   Glucerna Liqd Take 237 mLs by mouth 2 (two) times daily between meals.   HYDROcodone-acetaminophen 5-325 MG tablet Commonly known as:  NORCO/VICODIN Take 1 tablet by mouth 3 (three) times daily.   Januvia 50 MG tablet Generic drug:  sitaGLIPtin Take 50 mg by mouth daily. Take along with Amaryl   Lantus 100 UNIT/ML injection Generic drug:  insulin glargine Inject 15 Units into the skin at bedtime.   levothyroxine 112 MCG tablet Commonly known as:  SYNTHROID Take 112 mcg by mouth daily before breakfast. 30 minutes before breakfast   linaclotide 145 MCG Caps capsule Commonly known as:  LINZESS Take 145 mcg by mouth daily.   metoprolol tartrate 25 MG tablet Commonly known as:  LOPRESSOR Take 1 tablet (25 mg total) by mouth 2 (two) times daily.   multivitamin with minerals Tabs tablet Take 1 tablet by mouth daily.   mupirocin ointment 2 % Commonly known as:  BACTROBAN Place 1 application into the nose 2 (two) times daily.   NovoLOG 100 UNIT/ML injection Generic drug:  insulin aspart Inject 4-8 Units into the skin 3 (three) times daily.   ondansetron 4 MG tablet Commonly known as:  ZOFRAN Take 1 tablet (4 mg total) by mouth every 6 (six) hours as needed  for nausea.   oxyCODONE-acetaminophen 5-325 MG tablet Commonly known as:  PERCOCET/ROXICET Take 1 tablet by mouth every 6 (six) hours as needed for severe pain. What changed:    when to take this  reasons to take this   potassium chloride SA 20 MEQ tablet Commonly known as:  K-DUR Take 20 mEq by mouth daily.   simethicone 80 MG chewable tablet Commonly known as:  MYLICON Chew 80 mg by mouth every 6 (six) hours.   traMADol 50 MG tablet Commonly known as:  Ultram Take 1 tablet (50 mg total) by mouth every 6 (six) hours as needed for moderate pain.   traZODone 50 MG tablet Commonly known as:  DESYREL Take 50 mg by mouth at bedtime. For anxiety       Relevant Imaging Results:  Relevant Lab Results:   Additional Information ssn: [id number]  Elza Rafter, RN

## 2018-06-03 NOTE — Discharge Summary (Signed)
[place] [facility] [place] NAME: Sharon [name]    MR#:  [id number]  DATE OF BIRTH:  1933-01-23  DATE OF ADMISSION:  05/28/2018 ADMITTING PHYSICIAN: Gorden Harms, MD  DATE OF DISCHARGE:  06/03/18   PRIMARY CARE PHYSICIAN: Rusty Aus, MD    ADMISSION DIAGNOSIS:  Hypotension, unspecified hypotension type [I95.9] Urinary tract infection without hematuria, site unspecified [N39.0]  DISCHARGE DIAGNOSIS:  Hypotension resolved UTI ruled out-no need of antibiotics as it is a colonization per ID L1 compression fracture status post kyphoplasty COVID test negative  SECONDARY DIAGNOSIS:   Past Medical History:  Diagnosis Date  . Anemia   . Atrial fibrillation ([place])   . Cardiac arrest ([place])   . Cataract   . CHF (congestive heart failure) ([place])   . Diabetes mellitus without complication ([place])   . Edema    feet/ankles occas  . Hip fracture ([place])   . Hyperlipidemia   . Hypertension   . Hypothyroid   . Neuropathy   . Osteomyelitis ([place])    left first metatarsal  . Stroke [facility])     HOSPITAL COURSE:   HISTORY OF PRESENT ILLNESS: Sharon [name]  is a 85 y.o. female with a known history per below, lives in nursing facility, was in the wound clinic earlier today where she was found to have blood pressure in the 60s, sent to the emergency room for further evaluation/care, patient recently started on trazodone-thought to be leading to low blood pressure as she took this in addition to Percocet, patient complaining of acute on chronic diffuse pain primarily in her back, in the emergency room patient was found to be bradycardic with heart rate in the 40s, pressure in the 60s-improved with IV fluids for rehydration, potassium was 5.2, bicarb 18, creatinine 1.7 with baseline around 1.4, AST 170, ALT 55, ultrasound noted for prior cholecystectomy, urinalysis consistent with UTI, patient evaluated in the emergency room, no apparent distress, resting  comfortably in bed, patient now be admitted for acute hypotension suspected due to medication, acute UTI.  Hospital course  *Acute on chronic pain syndrome-significant low back pain Noted history of L1 fracture, x-ray confirms L1 compression fracture Pain protocol provided blood pressure stable Consult Dr. Rudene Christians for kyphoplasty -done today and okay to discharge patient from Dr. Collene Mares standpoint. Physical therapy recommended skilled nursing facility, but son prefers home health at [facility] assisted living facility as per his discussion with the case management  *Acute urinary tract infection-ruled out Patient initially started on on empiric antibiotics with Rocephin Urine has revealed Enterococcus faecalis which can be a contaminant.  ID consult placed.  Patient's post void bladder scan has revealed less than 90 cc urine which is not significant.  Patient clinically has gotten better without treating with appropriate antibiotic so infectious disease has recommended to discontinue ceftriaxone and no other need of antibiotics  *Sacral pressure ulcer stage II reposition patient and wound care  *Acute severe hypotension -resolved   This was also thought to be medication related given patient being started on trazodone recently in addition to being on Percocet.  Appears to have responded well to IV fluids.Blood pressure improved with recent blood pressure 120/72.  Metoprolol her home medication and Lasix resumed Discontinued IV fluids  *Chronic diabetes mellitus type 2 Continue home regiment, carbohydrate consistent diet, sliding scale insulin with Accu-Cheks per routine  *Chronic bilateral lower extremity leg wounds 1 cm round nonintact lesion to the left anterior lower leg.  Bilateral  Unna boots changing weekly.  Wound bed radiated with minimal serosanguineous discharge and minimal edema. Cleanse legs with soap and water and pat dry. Apply zinc layer from below toes to below knee. Secure  with self adherent coban. Change weekly on Friday per wound care recommendations Continue local wound care.  C  *Chronic adult failure to thrive/malnutrition Dietary supplements  *History of atrial fibrillation Stable Continue amiodarone, Lopressor, not on oral anticoagulation  *Chronic hypothyroidism, unspecified Continue Synthroid.  TSH normal.  *Patient  seen by physical therapy after kyphoplasty-recommending skilled nursing facility, but son prefers home health at [facility] assisted living facility as per his discussion with the case management  patient seen by palliative care, discussed with the son who was not very cooperative.  Recommending outpatient palliative care if son is agreeable  DVT prophylaxis-Lovenox   DISCHARGE CONDITIONS:   Fair   CONSULTS OBTAINED:  Treatment Team:  Tsosie Billing, MD Hessie Knows, MD   PROCEDURES kyphoplasty 06/03/2018  DRUG ALLERGIES:   Allergies  Allergen Reactions  . Carbamazepine Other (See Comments)    Reaction: unknown  . Cymbalta [Duloxetine Hcl] Other (See Comments)    Confusion, disorientation  . Duloxetine     Other reaction(s): Other (See Comments) Confusion, disorientation  . Lyrica [Pregabalin] Other (See Comments)    Patient and son states this makes the patient confused.  . Temazepam     Other reaction(s): Hallucination    DISCHARGE MEDICATIONS:   Allergies as of 06/03/2018      Reactions   Carbamazepine Other (See Comments)   Reaction: unknown   Cymbalta [duloxetine Hcl] Other (See Comments)   Confusion, disorientation   Duloxetine    Other reaction(s): Other (See Comments) Confusion, disorientation   Lyrica [pregabalin] Other (See Comments)   Patient and son states this makes the patient confused.   Temazepam    Other reaction(s): Hallucination      Medication List    TAKE these medications   acetaminophen 500 MG tablet Commonly known as:  TYLENOL Take 1,000 mg by mouth at  bedtime. For pain management   acetaminophen 650 MG CR tablet Commonly known as:  TYLENOL Take 650 mg by mouth every 8 (eight) hours as needed. For discomfort   albuterol (2.5 MG/3ML) 0.083% nebulizer solution Commonly known as:  PROVENTIL Take 3 mLs (2.5 mg total) by nebulization every 6 (six) hours as needed for wheezing or shortness of breath.   amiodarone 200 MG tablet Commonly known as:  PACERONE Take 1 tablet (200 mg total) by mouth daily.   ascorbic acid 250 MG tablet Commonly known as:  VITAMIN C Take 1 tablet (250 mg total) by mouth 2 (two) times daily.   atorvastatin 10 MG tablet Commonly known as:  LIPITOR Take 10 mg by mouth daily at 10 pm.   bisacodyl 5 MG EC tablet Commonly known as:  DULCOLAX Take 5 mg by mouth daily.   cholecalciferol 1000 units tablet Commonly known as:  VITAMIN D Take 1,000 Units by mouth daily.   collagenase ointment Commonly known as:  SANTYL Apply topically daily.   docusate sodium 100 MG capsule Commonly known as:  COLACE Take 1 capsule (100 mg total) by mouth 2 (two) times daily as needed for mild constipation.   feeding supplement (PRO-STAT SUGAR FREE 64) Liqd Take 30 mLs by mouth 2 (two) times daily between meals.   ferrous sulfate 325 (65 FE) MG tablet Take 325 mg by mouth daily.   fluconazole 150 MG tablet Commonly known as:  DIFLUCAN Take 1 tablet (150 mg total) by mouth daily.   furosemide 20 MG tablet Commonly known as:  LASIX Take 20 mg by mouth daily.   gabapentin 300 MG capsule Commonly known as:  NEURONTIN Take 300 mg by mouth 3 (three) times daily.   Glucerna Liqd Take 237 mLs by mouth 2 (two) times daily between meals.   HYDROcodone-acetaminophen 5-325 MG tablet Commonly known as:  NORCO/VICODIN Take 1 tablet by mouth 3 (three) times daily.   Januvia 50 MG tablet Generic drug:  sitaGLIPtin Take 50 mg by mouth daily. Take along with Amaryl   Lantus 100 UNIT/ML injection Generic drug:  insulin  glargine Inject 15 Units into the skin at bedtime.   levothyroxine 112 MCG tablet Commonly known as:  SYNTHROID Take 112 mcg by mouth daily before breakfast. 30 minutes before breakfast   linaclotide 145 MCG Caps capsule Commonly known as:  LINZESS Take 145 mcg by mouth daily.   metoprolol tartrate 25 MG tablet Commonly known as:  LOPRESSOR Take 1 tablet (25 mg total) by mouth 2 (two) times daily.   multivitamin with minerals Tabs tablet Take 1 tablet by mouth daily.   mupirocin ointment 2 % Commonly known as:  BACTROBAN Place 1 application into the nose 2 (two) times daily.   NovoLOG 100 UNIT/ML injection Generic drug:  insulin aspart Inject 4-8 Units into the skin 3 (three) times daily.   ondansetron 4 MG tablet Commonly known as:  ZOFRAN Take 1 tablet (4 mg total) by mouth every 6 (six) hours as needed for nausea.   oxyCODONE-acetaminophen 5-325 MG tablet Commonly known as:  PERCOCET/ROXICET Take 1 tablet by mouth every 6 (six) hours as needed for severe pain. What changed:    when to take this  reasons to take this   potassium chloride SA 20 MEQ tablet Commonly known as:  K-DUR Take 20 mEq by mouth daily.   simethicone 80 MG chewable tablet Commonly known as:  MYLICON Chew 80 mg by mouth every 6 (six) hours.   traMADol 50 MG tablet Commonly known as:  Ultram Take 1 tablet (50 mg total) by mouth every 6 (six) hours as needed for moderate pain.   traZODone 50 MG tablet Commonly known as:  DESYREL Take 50 mg by mouth at bedtime. For anxiety        DISCHARGE INSTRUCTIONS:  Follow-up with primary care physician in 3 days Outpatient palliative care please Follow-up with Dr. Rudene Christians in 10 days Continue wound care of the lower extremity wounds Home health PT RN DIET:  Carb modified, thin fluid consistent  DISCHARGE CONDITION:  Stable  ACTIVITY:  Activity as tolerated per PT  OXYGEN:  Home Oxygen: No.   Oxygen Delivery: room air  DISCHARGE  LOCATION:  [facility] assisted living facility with home health  If you experience worsening of your admission symptoms, develop shortness of breath, life threatening emergency, suicidal or homicidal thoughts you must seek medical attention immediately by calling 911 or calling your MD immediately  if symptoms less severe.  You Must read complete instructions/literature along with all the possible adverse reactions/side effects for all the Medicines you take and that have been prescribed to you. Take any new Medicines after you have completely understood and accpet all the possible adverse reactions/side effects.   Please note  You were cared for by a hospitalist during your hospital stay. If you have any questions about your discharge medications or the care you received while you were in the hospital  after you are discharged, you can call the unit and asked to speak with the hospitalist on call if the hospitalist that took care of you is not available. Once you are discharged, your primary care physician will handle any further medical issues. Please note that NO REFILLS for any discharge medications will be authorized once you are discharged, as it is imperative that you return to your primary care physician (or establish a relationship with a primary care physician if you do not have one) for your aftercare needs so that they can reassess your need for medications and monitor your lab values.     Today  Chief Complaint  Patient presents with  . Hypotension   Patient is doing fine.  Had kyphoplasty today.  Okay to discharge patient from orthopedic standpoint.  Physical therapy is recommending skilled nursing facility but son is asking to send her back to [facility] assisted living facility with home health patient has chronic dementia  ROS:  Review of system unobtainable from underlying dementia  VITAL SIGNS:  Blood pressure (!) 182/58, pulse (!) 52, temperature (!) 97.1 F (36.2 C), resp.  rate 13, height 5\' 5"  (1.651 m), weight 56.6 kg, SpO2 98 %.  I/O:    Intake/Output Summary (Last 24 hours) at 06/03/2018 1310 Last data filed at 06/03/2018 1108 Gross per 24 hour  Intake 150 ml  Output 0 ml  Net 150 ml    PHYSICAL EXAMINATION:  GENERAL:  85 y.o.-year-old patient lying in the bed with no acute distress.  EYES: Pupils equal, round, reactive to light and accommodation. No scleral icterus. Extraocular muscles intact.  HEENT: Head atraumatic, normocephalic. Oropharynx and nasopharynx clear.  NECK:  Supple, no jugular venous distention. No thyroid enlargement, no tenderness.  LUNGS: Normal breath sounds bilaterally, no wheezing, rales,rhonchi or crepitation. No use of accessory muscles of respiration.  CARDIOVASCULAR: S1, S2 normal. No murmurs, rubs, or gallops.  ABDOMEN: Soft, non-tender, non-distended. Bowel sounds present.  Back status post L1 kyphoplasty EXTREMITIES: No pedal edema, cyanosis, or clubbing.  NEUROLOGIC: Arousable but altered from underlying dementia gait not checked.  PSYCHIATRIC: The patient is alert and has chronic dementia SKIN: No obvious rash, lesion, or ulcer.   DATA REVIEW:   CBC Recent Labs  Lab 05/30/18 0439  WBC 7.2  HGB 11.2*  HCT 36.4  PLT 222    Chemistries  Recent Labs  Lab 05/28/18 1038  05/30/18 0439 05/31/18 0503 06/03/18 1141  NA 134*   < > 140 139  --   K 5.2*   < > 3.4* 3.4* 4.3  CL 104   < > 112* 111  --   CO2 18*   < > 21* 22  --   GLUCOSE 175*   < > 86 95  --   BUN 25*   < > 13 8  --   CREATININE 1.71*   < > 0.93 0.92  --   CALCIUM 8.3*   < > 7.9* 8.1*  --   MG  --   --  1.8  --   --   AST 170*  --   --   --   --   ALT 55*  --   --   --   --   ALKPHOS 357*  --   --   --   --   BILITOT 0.7  --   --   --   --    < > = values in this interval not displayed.  Cardiac Enzymes Recent Labs  Lab 05/28/18 1038  TROPONINI <0.03    Microbiology Results  Results for orders placed or performed during the  hospital encounter of 05/28/18  Urine Culture     Status: Abnormal   Collection Time: 05/28/18  1:18 PM  Result Value Ref Range Status   Specimen Description   Final    URINE, RANDOM Performed at [facility], [address], [place], [place] [address]    Special Requests   Final    NONE Performed at [facility], [address] [place], [place] [address]    Culture >=100,000 COLONIES/mL ENTEROCOCCUS FAECALIS (A)  Final   Report Status 05/31/2018 FINAL  Final   Organism ID, Bacteria ENTEROCOCCUS FAECALIS (A)  Final      Susceptibility   Enterococcus faecalis - MIC*    AMPICILLIN <=2 SENSITIVE Sensitive     LEVOFLOXACIN >=8 RESISTANT Resistant     NITROFURANTOIN <=16 SENSITIVE Sensitive     VANCOMYCIN 1 SENSITIVE Sensitive     * >=100,000 COLONIES/mL ENTEROCOCCUS FAECALIS  SARS Coronavirus 2 (CEPHEID - Performed in [place] hospital lab), Hosp Order     Status: None   Collection Time: 05/28/18  1:48 PM  Result Value Ref Range Status   SARS Coronavirus 2 NEGATIVE NEGATIVE Final    Comment: (NOTE) If result is NEGATIVE SARS-CoV-2 target nucleic acids are NOT DETECTED. The SARS-CoV-2 RNA is generally detectable in upper and lower  respiratory specimens during the acute phase of infection. The lowest  concentration of SARS-CoV-2 viral copies this assay can detect is 250  copies / mL. A negative result does not preclude SARS-CoV-2 infection  and should not be used as the sole basis for treatment or other  patient management decisions.  A negative result may occur with  improper specimen collection / handling, submission of specimen other  than nasopharyngeal swab, presence of viral mutation(s) within the  areas targeted by this assay, and inadequate number of viral copies  (<250 copies / mL). A negative result must be combined with clinical  observations, patient history, and epidemiological information. If result is POSITIVE SARS-CoV-2 target nucleic acids  are DETECTED. The SARS-CoV-2 RNA is generally detectable in upper and lower  respiratory specimens dur ing the acute phase of infection.  Positive  results are indicative of active infection with SARS-CoV-2.  Clinical  correlation with patient history and other diagnostic information is  necessary to determine patient infection status.  Positive results do  not rule out bacterial infection or co-infection with other viruses. If result is PRESUMPTIVE POSTIVE SARS-CoV-2 nucleic acids MAY BE PRESENT.   A presumptive positive result was obtained on the submitted specimen  and confirmed on repeat testing.  While 2019 novel coronavirus  (SARS-CoV-2) nucleic acids may be present in the submitted sample  additional confirmatory testing may be necessary for epidemiological  and / or clinical management purposes  to differentiate between  SARS-CoV-2 and other Sarbecovirus currently known to infect humans.  If clinically indicated additional testing with an alternate test  methodology [phone]) is advised. The SARS-CoV-2 RNA is generally  detectable in upper and lower respiratory sp ecimens during the acute  phase of infection. The expected result is Negative. Fact Sheet for Patients:  StrictlyIdeas.no Fact Sheet for Healthcare Providers: BankingDealers.co.za This test is not yet approved or cleared by the [place] FDA and has been authorized for detection and/or diagnosis of SARS-CoV-2 by FDA under an Emergency Use Authorization (EUA).  This EUA  will remain in effect (meaning this test can be used) for the duration of the COVID-19 declaration under Section 564(b)(1) of the Act, 21 U.S.C. section 360bbb-3(b)(1), unless the authorization is terminated or revoked sooner. Performed at [facility], [address] [place], [place] [address]   MRSA PCR Screening     Status: None   Collection Time: 05/29/18 12:16 AM  Result Value Ref  Range Status   MRSA by PCR NEGATIVE NEGATIVE Final    Comment:        The GeneXpert MRSA Assay (FDA approved for NASAL specimens only), is one component of a comprehensive MRSA colonization surveillance program. It is not intended to diagnose MRSA infection nor to guide or monitor treatment for MRSA infections. Performed at [facility], [address], [place], [place] [address]     RADIOLOGY:  Dg Lumbar Spine 2-3 Views  Result Date: 06/03/2018 CLINICAL DATA:  L1 kyphoplasty. EXAM: LUMBAR SPINE - 2-3 VIEW; DG C-ARM 61-120 MIN Radiation exposure index: 24.06 mGy. COMPARISON:  Radiographs of Jun 01, 2018. FINDINGS: Two intraoperative fluoroscopic images of the lumbar spine demonstrate the patient be status post kyphoplasty of L1 vertebral body. IMPRESSION: Fluoroscopic guidance provided during kyphoplasty of L1 vertebral body. Electronically Signed   By: Marijo Conception M.D.   On: 06/03/2018 09:16   Dg Lumbar Spine 2-3 Views  Result Date: 06/01/2018 CLINICAL DATA:  Lower back pain EXAM: LUMBAR SPINE - 2-3 VIEW COMPARISON:  CT of the pelvis dated 04/25/2018. FINDINGS: There is a new compression fracture of the L1 vertebral body with approximately 20% height loss anteriorly. Advanced degenerative changes are noted throughout the remaining portions of the lumbar spine. Bilateral common iliac stents are noted. The patient is status post bilateral total hip arthroplasty. There is diffuse osteopenia. IMPRESSION: 1. Age-indeterminate compression fracture of the L1 vertebral body with approximately 20% height loss anteriorly. This is new since CT dated 04/25/2018. 2. Advanced degenerative changes throughout the lumbar spine. Osteopenia. Electronically Signed   By: Constance Holster M.D.   On: 06/01/2018 13:42   Dg C-arm 1-60 Min  Result Date: 06/03/2018 CLINICAL DATA:  L1 kyphoplasty. EXAM: LUMBAR SPINE - 2-3 VIEW; DG C-ARM 61-120 MIN Radiation exposure index: 24.06 mGy. COMPARISON:   Radiographs of Jun 01, 2018. FINDINGS: Two intraoperative fluoroscopic images of the lumbar spine demonstrate the patient be status post kyphoplasty of L1 vertebral body. IMPRESSION: Fluoroscopic guidance provided during kyphoplasty of L1 vertebral body. Electronically Signed   By: Marijo Conception M.D.   On: 06/03/2018 09:16    EKG:   Orders placed or performed during the hospital encounter of 05/28/18  . ED EKG  . ED EKG  . EKG 12-Lead  . EKG 12-Lead  . EKG 12-Lead  . EKG 12-Lead      Management plans discussed with the patient, patient's son Eric Form over phone and they are in agreement.  CODE STATUS:     Code Status Orders  (From admission, onward)         Start     Ordered   05/28/18 1711  Full code  Continuous     05/28/18 1710        Code Status History    Date Active Date Inactive Code Status Order ID Comments User Context   03/09/2018 0309 03/12/2018 1654 Full Code [id number]  Lance Coon, MD Inpatient   04/16/2017 1705 04/21/2017 2102 Full Code [id number]  Fritzi Mandes, MD Inpatient   03/23/2017 1422 03/23/2017 2233  Full Code [id number]  Gladstone Lighter, MD Inpatient   03/20/2017 1121 03/23/2017 1422 DNR [id number]  Bettey Costa, MD Inpatient   03/20/2017 0140 03/20/2017 1121 Full Code [id number]  Lance Coon, MD ED   07/07/2015 0353 07/09/2015 1520 Full Code [id number]  Saundra Shelling, MD Inpatient   02/07/2015 1605 02/12/2015 1921 Full Code [id number]  Dustin Flock, MD Inpatient   11/05/2014 1551 11/09/2014 1536 Full Code [id number]  Gladstone Lighter, MD Inpatient   10/10/2014 1112 10/17/2014 1750 Full Code [id number]  Epifanio Lesches, MD Inpatient    Advance Directive Documentation     Most Recent Value  Type of Advance Directive  Healthcare Power of Attorney  Pre-existing out of facility DNR order (yellow form or pink MOST form)  -  "MOST" Form in Place?  -      TOTAL TIME TAKING CARE OF THIS PATIENT: 45  minutes.   Note: This dictation was prepared with  Dragon dictation along with smaller phrase technology. Any transcriptional errors that result from this process are unintentional.   @[facility] @  on 06/03/2018 at 1:10 PM  Between 7am to 6pm - Pager - [phone]  After 6pm go to [email or web address] - password [email or web address] [place] [facility]  Office  [phone]  CC: Primary care physician; Rusty Aus, MD

## 2018-06-03 NOTE — TOC Progression Note (Signed)
Transition of Care [facility]) - Progression Note    Patient Details  Name: Sharon [name] MRN: [id number] Date of Birth: [birth year]  Transition of [facility] CM/SW Contact  Elza Rafter, RN Phone Number: 06/03/2018, 4:13 PM  Clinical Narrative:   [place].  Referral to [facility] as son would prefer not to go to [facility].    Expected Discharge Plan: Home/Self Care Barriers to Discharge: Continued Medical Work up  Expected Discharge Plan and Services Expected Discharge Plan: Home/Self Care   Discharge Planning Services: CM Consult   Living arrangements for the past 2 months: Single Family Home Expected Discharge Date: 06/03/18                                     Social Determinants of Health (SDOH) Interventions    Readmission Risk Interventions Readmission Risk Prevention Plan 05/29/2018  Transportation Screening Complete  Medication Review Press photographer) Complete  Palliative Care Screening Not [place] Not Applicable  Some recent data might be hidden

## 2018-06-03 NOTE — Progress Notes (Signed)
PT Cancellation Note  Patient Details Name: Sharon [name] MRN: [id number] DOB: December 01, 1933   Cancelled Treatment:    Reason Eval/Treat Not Completed: Other (comment);Patient at procedure or test/unavailable(Patient out of room, potentially at kyphoplasty. Will attempt again at later time/date. )  Janna Arch, PT, DPT   06/03/2018, 9:06 AM

## 2018-06-03 NOTE — Care Management Important Message (Signed)
Important Message  Patient Details  Name: Sharon [name] MRN: [id number] Date of Birth: 1933-03-16   Medicare Important Message Given:  Yes  Reviewed with Julio Sicks, son, at [phone].  Aware of right.  Requested copy via email.   Copy sent securely to son's email: [email or web address].  Hard copy also left in patient's room on 06/02/18.  Dannette Barbara 06/03/2018, 1:18 PM

## 2018-06-03 NOTE — Transfer of Care (Signed)
Immediate Anesthesia Transfer of Care Note  Patient: Sharon [name]  Procedure(s) Performed: KYPHOPLASTY L1 (N/A Back)  Patient Location: PACU  Anesthesia Type:MAC  Level of Consciousness: drowsy and confused  Airway & Oxygen Therapy: Patient Spontanous Breathing  Post-op Assessment: Report given to RN and Post -op Vital signs reviewed and stable  Post vital signs: Reviewed and stable  Last Vitals:  Vitals Value Taken Time  BP 171/54 06/03/2018  9:08 AM  Temp    Pulse 53 06/03/2018  9:08 AM  Resp 17 06/03/2018  9:08 AM  SpO2 100 % 06/03/2018  9:08 AM  Vitals shown include unvalidated device data.  Last Pain:  Vitals:   06/03/18 0907  TempSrc:   PainSc: (P) Asleep         Complications: No apparent anesthesia complications

## 2018-06-03 NOTE — Discharge Instructions (Signed)
Follow-up with primary care physician in 3 days Outpatient palliative care please Follow-up with Dr. Rudene Christians in 10 days Continue wound care of the lower extremity wounds Home health PT RN

## 2018-06-04 DIAGNOSIS — I252 Old myocardial infarction: Secondary | ICD-10-CM | POA: Diagnosis not present

## 2018-06-04 DIAGNOSIS — I2510 Atherosclerotic heart disease of native coronary artery without angina pectoris: Secondary | ICD-10-CM | POA: Diagnosis not present

## 2018-06-04 DIAGNOSIS — H269 Unspecified cataract: Secondary | ICD-10-CM | POA: Diagnosis not present

## 2018-06-04 DIAGNOSIS — I6930 Unspecified sequelae of cerebral infarction: Secondary | ICD-10-CM | POA: Diagnosis not present

## 2018-06-04 DIAGNOSIS — F5101 Primary insomnia: Secondary | ICD-10-CM | POA: Diagnosis not present

## 2018-06-04 DIAGNOSIS — Z7401 Bed confinement status: Secondary | ICD-10-CM | POA: Diagnosis not present

## 2018-06-04 DIAGNOSIS — M25552 Pain in left hip: Secondary | ICD-10-CM | POA: Diagnosis not present

## 2018-06-04 DIAGNOSIS — E1140 Type 2 diabetes mellitus with diabetic neuropathy, unspecified: Secondary | ICD-10-CM | POA: Diagnosis not present

## 2018-06-04 DIAGNOSIS — S32010D Wedge compression fracture of first lumbar vertebra, subsequent encounter for fracture with routine healing: Secondary | ICD-10-CM | POA: Diagnosis not present

## 2018-06-04 DIAGNOSIS — M549 Dorsalgia, unspecified: Secondary | ICD-10-CM | POA: Diagnosis not present

## 2018-06-04 DIAGNOSIS — R279 Unspecified lack of coordination: Secondary | ICD-10-CM | POA: Diagnosis not present

## 2018-06-04 DIAGNOSIS — R58 Hemorrhage, not elsewhere classified: Secondary | ICD-10-CM | POA: Diagnosis not present

## 2018-06-04 DIAGNOSIS — Z5181 Encounter for therapeutic drug level monitoring: Secondary | ICD-10-CM | POA: Diagnosis not present

## 2018-06-04 DIAGNOSIS — L22 Diaper dermatitis: Secondary | ICD-10-CM | POA: Diagnosis not present

## 2018-06-04 DIAGNOSIS — Z515 Encounter for palliative care: Secondary | ICD-10-CM | POA: Diagnosis not present

## 2018-06-04 DIAGNOSIS — Z7189 Other specified counseling: Secondary | ICD-10-CM | POA: Diagnosis not present

## 2018-06-04 DIAGNOSIS — R05 Cough: Secondary | ICD-10-CM | POA: Diagnosis not present

## 2018-06-04 DIAGNOSIS — E46 Unspecified protein-calorie malnutrition: Secondary | ICD-10-CM | POA: Diagnosis not present

## 2018-06-04 DIAGNOSIS — R404 Transient alteration of awareness: Secondary | ICD-10-CM | POA: Diagnosis not present

## 2018-06-04 DIAGNOSIS — M25551 Pain in right hip: Secondary | ICD-10-CM | POA: Diagnosis not present

## 2018-06-04 DIAGNOSIS — F332 Major depressive disorder, recurrent severe without psychotic features: Secondary | ICD-10-CM | POA: Diagnosis not present

## 2018-06-04 DIAGNOSIS — N390 Urinary tract infection, site not specified: Secondary | ICD-10-CM | POA: Diagnosis not present

## 2018-06-04 DIAGNOSIS — G8929 Other chronic pain: Secondary | ICD-10-CM | POA: Diagnosis not present

## 2018-06-04 DIAGNOSIS — Z8673 Personal history of transient ischemic attack (TIA), and cerebral infarction without residual deficits: Secondary | ICD-10-CM | POA: Diagnosis not present

## 2018-06-04 DIAGNOSIS — F0390 Unspecified dementia without behavioral disturbance: Secondary | ICD-10-CM | POA: Diagnosis not present

## 2018-06-04 DIAGNOSIS — M6281 Muscle weakness (generalized): Secondary | ICD-10-CM | POA: Diagnosis not present

## 2018-06-04 DIAGNOSIS — R0989 Other specified symptoms and signs involving the circulatory and respiratory systems: Secondary | ICD-10-CM | POA: Diagnosis not present

## 2018-06-04 DIAGNOSIS — I509 Heart failure, unspecified: Secondary | ICD-10-CM | POA: Diagnosis not present

## 2018-06-04 DIAGNOSIS — E1165 Type 2 diabetes mellitus with hyperglycemia: Secondary | ICD-10-CM | POA: Diagnosis not present

## 2018-06-04 DIAGNOSIS — N183 Chronic kidney disease, stage 3 (moderate): Secondary | ICD-10-CM | POA: Diagnosis not present

## 2018-06-04 DIAGNOSIS — F419 Anxiety disorder, unspecified: Secondary | ICD-10-CM | POA: Diagnosis not present

## 2018-06-04 DIAGNOSIS — R5381 Other malaise: Secondary | ICD-10-CM | POA: Diagnosis not present

## 2018-06-04 DIAGNOSIS — I499 Cardiac arrhythmia, unspecified: Secondary | ICD-10-CM | POA: Diagnosis not present

## 2018-06-04 DIAGNOSIS — R21 Rash and other nonspecific skin eruption: Secondary | ICD-10-CM | POA: Diagnosis not present

## 2018-06-04 DIAGNOSIS — E785 Hyperlipidemia, unspecified: Secondary | ICD-10-CM | POA: Diagnosis not present

## 2018-06-04 DIAGNOSIS — J449 Chronic obstructive pulmonary disease, unspecified: Secondary | ICD-10-CM | POA: Diagnosis not present

## 2018-06-04 DIAGNOSIS — R39198 Other difficulties with micturition: Secondary | ICD-10-CM | POA: Diagnosis not present

## 2018-06-04 DIAGNOSIS — Z87891 Personal history of nicotine dependence: Secondary | ICD-10-CM | POA: Diagnosis not present

## 2018-06-04 DIAGNOSIS — F0391 Unspecified dementia with behavioral disturbance: Secondary | ICD-10-CM | POA: Diagnosis not present

## 2018-06-04 DIAGNOSIS — Z89411 Acquired absence of right great toe: Secondary | ICD-10-CM | POA: Diagnosis not present

## 2018-06-04 DIAGNOSIS — I959 Hypotension, unspecified: Secondary | ICD-10-CM | POA: Diagnosis not present

## 2018-06-04 DIAGNOSIS — L89153 Pressure ulcer of sacral region, stage 3: Secondary | ICD-10-CM | POA: Diagnosis not present

## 2018-06-04 DIAGNOSIS — R5383 Other fatigue: Secondary | ICD-10-CM | POA: Diagnosis not present

## 2018-06-04 DIAGNOSIS — I739 Peripheral vascular disease, unspecified: Secondary | ICD-10-CM | POA: Diagnosis not present

## 2018-06-04 DIAGNOSIS — T82328A Displacement of other vascular grafts, initial encounter: Secondary | ICD-10-CM | POA: Diagnosis not present

## 2018-06-04 DIAGNOSIS — N19 Unspecified kidney failure: Secondary | ICD-10-CM | POA: Diagnosis not present

## 2018-06-04 DIAGNOSIS — D649 Anemia, unspecified: Secondary | ICD-10-CM | POA: Diagnosis not present

## 2018-06-04 DIAGNOSIS — F064 Anxiety disorder due to known physiological condition: Secondary | ICD-10-CM | POA: Diagnosis not present

## 2018-06-04 DIAGNOSIS — K648 Other hemorrhoids: Secondary | ICD-10-CM | POA: Diagnosis not present

## 2018-06-04 DIAGNOSIS — R109 Unspecified abdominal pain: Secondary | ICD-10-CM | POA: Diagnosis not present

## 2018-06-04 DIAGNOSIS — L97319 Non-pressure chronic ulcer of right ankle with unspecified severity: Secondary | ICD-10-CM | POA: Diagnosis not present

## 2018-06-04 DIAGNOSIS — R52 Pain, unspecified: Secondary | ICD-10-CM | POA: Diagnosis not present

## 2018-06-04 DIAGNOSIS — L97129 Non-pressure chronic ulcer of left thigh with unspecified severity: Secondary | ICD-10-CM | POA: Diagnosis not present

## 2018-06-04 DIAGNOSIS — L89614 Pressure ulcer of right heel, stage 4: Secondary | ICD-10-CM | POA: Diagnosis not present

## 2018-06-04 DIAGNOSIS — F05 Delirium due to known physiological condition: Secondary | ICD-10-CM | POA: Diagnosis not present

## 2018-06-04 DIAGNOSIS — E1122 Type 2 diabetes mellitus with diabetic chronic kidney disease: Secondary | ICD-10-CM | POA: Diagnosis not present

## 2018-06-04 DIAGNOSIS — L89109 Pressure ulcer of unspecified part of back, unspecified stage: Secondary | ICD-10-CM | POA: Diagnosis not present

## 2018-06-04 DIAGNOSIS — L89152 Pressure ulcer of sacral region, stage 2: Secondary | ICD-10-CM | POA: Diagnosis not present

## 2018-06-04 DIAGNOSIS — B372 Candidiasis of skin and nail: Secondary | ICD-10-CM | POA: Diagnosis not present

## 2018-06-04 DIAGNOSIS — L89624 Pressure ulcer of left heel, stage 4: Secondary | ICD-10-CM | POA: Diagnosis not present

## 2018-06-04 DIAGNOSIS — Z139 Encounter for screening, unspecified: Secondary | ICD-10-CM | POA: Diagnosis not present

## 2018-06-04 DIAGNOSIS — I5032 Chronic diastolic (congestive) heart failure: Secondary | ICD-10-CM | POA: Diagnosis not present

## 2018-06-04 DIAGNOSIS — R0902 Hypoxemia: Secondary | ICD-10-CM | POA: Diagnosis not present

## 2018-06-04 DIAGNOSIS — E1129 Type 2 diabetes mellitus with other diabetic kidney complication: Secondary | ICD-10-CM | POA: Diagnosis not present

## 2018-06-04 DIAGNOSIS — M545 Low back pain: Secondary | ICD-10-CM | POA: Diagnosis not present

## 2018-06-04 DIAGNOSIS — Z20828 Contact with and (suspected) exposure to other viral communicable diseases: Secondary | ICD-10-CM | POA: Diagnosis not present

## 2018-06-04 DIAGNOSIS — R339 Retention of urine, unspecified: Secondary | ICD-10-CM | POA: Diagnosis not present

## 2018-06-04 DIAGNOSIS — R410 Disorientation, unspecified: Secondary | ICD-10-CM | POA: Diagnosis not present

## 2018-06-04 DIAGNOSIS — Z743 Need for continuous supervision: Secondary | ICD-10-CM | POA: Diagnosis not present

## 2018-06-04 DIAGNOSIS — I480 Paroxysmal atrial fibrillation: Secondary | ICD-10-CM | POA: Diagnosis not present

## 2018-06-04 DIAGNOSIS — Z79899 Other long term (current) drug therapy: Secondary | ICD-10-CM | POA: Diagnosis not present

## 2018-06-04 DIAGNOSIS — K921 Melena: Secondary | ICD-10-CM | POA: Diagnosis present

## 2018-06-04 DIAGNOSIS — Y712 Prosthetic and other implants, materials and accessory cardiovascular devices associated with adverse incidents: Secondary | ICD-10-CM | POA: Diagnosis not present

## 2018-06-04 DIAGNOSIS — R402411 Glasgow coma scale score 13-15, in the field [EMT or ambulance]: Secondary | ICD-10-CM | POA: Diagnosis not present

## 2018-06-04 DIAGNOSIS — N189 Chronic kidney disease, unspecified: Secondary | ICD-10-CM | POA: Diagnosis not present

## 2018-06-04 DIAGNOSIS — Z794 Long term (current) use of insulin: Secondary | ICD-10-CM | POA: Diagnosis not present

## 2018-06-04 DIAGNOSIS — Z4789 Encounter for other orthopedic aftercare: Secondary | ICD-10-CM | POA: Diagnosis not present

## 2018-06-04 DIAGNOSIS — R627 Adult failure to thrive: Secondary | ICD-10-CM | POA: Diagnosis not present

## 2018-06-04 DIAGNOSIS — E119 Type 2 diabetes mellitus without complications: Secondary | ICD-10-CM | POA: Diagnosis not present

## 2018-06-04 DIAGNOSIS — I130 Hypertensive heart and chronic kidney disease with heart failure and stage 1 through stage 4 chronic kidney disease, or unspecified chronic kidney disease: Secondary | ICD-10-CM | POA: Diagnosis not present

## 2018-06-04 DIAGNOSIS — R079 Chest pain, unspecified: Secondary | ICD-10-CM | POA: Diagnosis not present

## 2018-06-04 DIAGNOSIS — G4700 Insomnia, unspecified: Secondary | ICD-10-CM | POA: Diagnosis not present

## 2018-06-04 DIAGNOSIS — R110 Nausea: Secondary | ICD-10-CM | POA: Diagnosis not present

## 2018-06-04 DIAGNOSIS — K625 Hemorrhage of anus and rectum: Secondary | ICD-10-CM | POA: Diagnosis not present

## 2018-06-04 DIAGNOSIS — E038 Other specified hypothyroidism: Secondary | ICD-10-CM | POA: Diagnosis not present

## 2018-06-04 DIAGNOSIS — I4891 Unspecified atrial fibrillation: Secondary | ICD-10-CM | POA: Diagnosis not present

## 2018-06-04 DIAGNOSIS — R54 Age-related physical debility: Secondary | ICD-10-CM | POA: Diagnosis not present

## 2018-06-04 DIAGNOSIS — N179 Acute kidney failure, unspecified: Secondary | ICD-10-CM | POA: Diagnosis not present

## 2018-06-04 DIAGNOSIS — I10 Essential (primary) hypertension: Secondary | ICD-10-CM | POA: Diagnosis not present

## 2018-06-04 DIAGNOSIS — K5900 Constipation, unspecified: Secondary | ICD-10-CM | POA: Diagnosis not present

## 2018-06-04 DIAGNOSIS — L89613 Pressure ulcer of right heel, stage 3: Secondary | ICD-10-CM | POA: Diagnosis not present

## 2018-06-04 DIAGNOSIS — S32010A Wedge compression fracture of first lumbar vertebra, initial encounter for closed fracture: Secondary | ICD-10-CM | POA: Diagnosis not present

## 2018-06-04 DIAGNOSIS — R630 Anorexia: Secondary | ICD-10-CM | POA: Diagnosis not present

## 2018-06-04 DIAGNOSIS — L89623 Pressure ulcer of left heel, stage 3: Secondary | ICD-10-CM | POA: Diagnosis not present

## 2018-06-04 DIAGNOSIS — E871 Hypo-osmolality and hyponatremia: Secondary | ICD-10-CM | POA: Diagnosis not present

## 2018-06-04 DIAGNOSIS — Z03818 Encounter for observation for suspected exposure to other biological agents ruled out: Secondary | ICD-10-CM | POA: Diagnosis not present

## 2018-06-04 DIAGNOSIS — L97829 Non-pressure chronic ulcer of other part of left lower leg with unspecified severity: Secondary | ICD-10-CM | POA: Diagnosis not present

## 2018-06-04 DIAGNOSIS — Z419 Encounter for procedure for purposes other than remedying health state, unspecified: Secondary | ICD-10-CM | POA: Diagnosis not present

## 2018-06-04 DIAGNOSIS — R41841 Cognitive communication deficit: Secondary | ICD-10-CM | POA: Diagnosis not present

## 2018-06-04 DIAGNOSIS — E039 Hypothyroidism, unspecified: Secondary | ICD-10-CM | POA: Diagnosis not present

## 2018-06-04 DIAGNOSIS — R1084 Generalized abdominal pain: Secondary | ICD-10-CM | POA: Diagnosis not present

## 2018-06-04 DIAGNOSIS — M2550 Pain in unspecified joint: Secondary | ICD-10-CM | POA: Diagnosis not present

## 2018-06-04 DIAGNOSIS — M5441 Lumbago with sciatica, right side: Secondary | ICD-10-CM | POA: Diagnosis not present

## 2018-06-04 DIAGNOSIS — R600 Localized edema: Secondary | ICD-10-CM | POA: Diagnosis not present

## 2018-06-04 LAB — GLUCOSE, CAPILLARY
Glucose-Capillary: 144 mg/dL — ABNORMAL HIGH (ref 70–99)
Glucose-Capillary: 178 mg/dL — ABNORMAL HIGH (ref 70–99)
Glucose-Capillary: 146 mg/dL — ABNORMAL HIGH (ref 70–99)

## 2018-06-04 NOTE — Discharge Summary (Addendum)
Sharon [name] at [place] NAME: Sharon [name]    MR#:  [id number]  DATE OF BIRTH:  1933/04/28  DATE OF ADMISSION:  05/28/2018 ADMITTING PHYSICIAN: Avel Peace Salary, MD  DATE OF DISCHARGE:  06/04/18   PRIMARY CARE PHYSICIAN: Rusty Aus, MD    ADMISSION DIAGNOSIS:  Hypotension, unspecified hypotension type [I95.9] Urinary tract infection without hematuria, site unspecified [N39.0]  DISCHARGE DIAGNOSIS:  Hypotension resolved UTI ruled out-no need of antibiotics as it is a colonization per ID L1 compression fracture status post kyphoplasty COVID test negative  SECONDARY DIAGNOSIS:   Past Medical History:  Diagnosis Date  . Anemia   . Atrial fibrillation ([place])   . Cardiac arrest ([place])   . Cataract   . CHF (congestive heart failure) ([place])   . Diabetes mellitus without complication ([place])   . Edema    feet/ankles occas  . Hip fracture ([place])   . Hyperlipidemia   . Hypertension   . Hypothyroid   . Neuropathy   . Osteomyelitis ([place])    left first metatarsal  . Stroke [facility])     HOSPITAL COURSE:   HISTORY OF PRESENT ILLNESS: Sharon [name]  is a 85 y.o. female with a known history per below, lives in nursing facility, was in the wound clinic earlier today where she was found to have blood pressure in the 60s, sent to the emergency room for further evaluation/care, patient recently started on trazodone-thought to be leading to low blood pressure as she took this in addition to Percocet, patient complaining of acute on chronic diffuse pain primarily in her back, in the emergency room patient was found to be bradycardic with heart rate in the 40s, pressure in the 60s-improved with IV fluids for rehydration, potassium was 5.2, bicarb 18, creatinine 1.7 with baseline around 1.4, AST 170, ALT 55, ultrasound noted for prior cholecystectomy, urinalysis consistent with UTI, patient evaluated in the emergency room, no apparent distress, resting  comfortably in bed, patient now be admitted for acute hypotension suspected due to medication, acute UTI.   *Acute on chronic pain syndrome-significant low back pain Noted history of L1 fracture, x-ray confirms L1 compression fracture Pain protocol provided blood pressure stable Consult Dr. Rudene Christians  Pt is s/p kyphoplasty  and okay to discharge patient from ortho standpoint. Physical therapy recommended skilled nursing facility, but son prefers home health at [facility] assisted living facility as per his discussion with the case management awaiting for patient's son to decide on rehab place  *Acute urinary tract infection-ruled out Patient initially started on on empiric antibiotics with Rocephin Urine has revealed Enterococcus faecalis which can be a contaminant.  ID consult placed.  Patient's post void bladder scan has revealed less than 90 cc urine which is not significant.  Patient clinically has gotten better without treating with appropriate antibiotic so infectious disease has recommended to discontinue ceftriaxone and no other need of antibiotics  *Sacral pressure ulcer stage II reposition patient and wound care  *Acute severe hypotension -resolved This was also thought to be medication related given patient being started on trazodone recently in addition to being on Percocet.  Appears to have responded well to IV fluids.Blood pressure improved with recent blood pressure 120/72.  Metoprolol her home medication and Lasix resumed Discontinued IV fluids  *Chronic diabetes mellitus type 2 Continue home regiment, carbohydrate consistent diet, sliding scale insulin with Accu-Cheks per routine  *Chronic bilateral lower extremity leg wounds 1 cm round nonintact lesion to the  left anterior lower leg.  Bilateral Unna boots changing weekly.  Wound bed radiated with minimal serosanguineous discharge and minimal edema. Cleanse legs with soap and water and pat dry. Apply zinc layer from below toes  to below knee. Secure with self adherent coban. Change weekly on Friday per wound care recommendations Continue local wound care.  C  *Chronic adult failure to thrive/malnutrition Dietary supplements  *History of atrial fibrillation Stable Continue amiodarone, Lopressor, not on oral anticoagulation  *Chronic hypothyroidism, unspecified Continue Synthroid.  TSH normal.  *Patient  seen by physical therapy after kyphoplasty-recommending skilled nursing facility and waiting for Insurance approval  patient seen by palliative care  Patient is medically stable for discharge and is best at baseline  DVT prophylaxis-Lovenox   DISCHARGE CONDITIONS:   Fair   CONSULTS OBTAINED:  Treatment Team:  Tsosie Billing, MD Hessie Knows, MD   PROCEDURES kyphoplasty 06/03/2018  DRUG ALLERGIES:   Allergies  Allergen Reactions  . Carbamazepine Other (See Comments)    Reaction: unknown  . Cymbalta [Duloxetine Hcl] Other (See Comments)    Confusion, disorientation  . Duloxetine     Other reaction(s): Other (See Comments) Confusion, disorientation  . Lyrica [Pregabalin] Other (See Comments)    Patient and son states this makes the patient confused.  . Temazepam     Other reaction(s): Hallucination    DISCHARGE MEDICATIONS:   Allergies as of 06/04/2018      Reactions   Carbamazepine Other (See Comments)   Reaction: unknown   Cymbalta [duloxetine Hcl] Other (See Comments)   Confusion, disorientation   Duloxetine    Other reaction(s): Other (See Comments) Confusion, disorientation   Lyrica [pregabalin] Other (See Comments)   Patient and son states this makes the patient confused.   Temazepam    Other reaction(s): Hallucination      Medication List    TAKE these medications   acetaminophen 500 MG tablet Commonly known as:  TYLENOL Take 1,000 mg by mouth at bedtime. For pain management   acetaminophen 650 MG CR tablet Commonly known as:  TYLENOL Take 650 mg by  mouth every 8 (eight) hours as needed. For discomfort   albuterol (2.5 MG/3ML) 0.083% nebulizer solution Commonly known as:  PROVENTIL Take 3 mLs (2.5 mg total) by nebulization every 6 (six) hours as needed for wheezing or shortness of breath.   amiodarone 200 MG tablet Commonly known as:  PACERONE Take 1 tablet (200 mg total) by mouth daily.   ascorbic acid 250 MG tablet Commonly known as:  VITAMIN C Take 1 tablet (250 mg total) by mouth 2 (two) times daily.   atorvastatin 10 MG tablet Commonly known as:  LIPITOR Take 10 mg by mouth daily at 10 pm.   bisacodyl 5 MG EC tablet Commonly known as:  DULCOLAX Take 5 mg by mouth daily.   cholecalciferol 1000 units tablet Commonly known as:  VITAMIN D Take 1,000 Units by mouth daily.   collagenase ointment Commonly known as:  SANTYL Apply topically daily.   docusate sodium 100 MG capsule Commonly known as:  COLACE Take 1 capsule (100 mg total) by mouth 2 (two) times daily as needed for mild constipation.   feeding supplement (PRO-STAT SUGAR FREE 64) Liqd Take 30 mLs by mouth 2 (two) times daily between meals.   ferrous sulfate 325 (65 FE) MG tablet Take 325 mg by mouth daily.   fluconazole 150 MG tablet Commonly known as:  DIFLUCAN Take 1 tablet (150 mg total) by mouth daily.  furosemide 20 MG tablet Commonly known as:  LASIX Take 20 mg by mouth daily.   gabapentin 300 MG capsule Commonly known as:  NEURONTIN Take 300 mg by mouth 3 (three) times daily.   Glucerna Liqd Take 237 mLs by mouth 2 (two) times daily between meals.   HYDROcodone-acetaminophen 5-325 MG tablet Commonly known as:  NORCO/VICODIN Take 1 tablet by mouth 3 (three) times daily.   Januvia 50 MG tablet Generic drug:  sitaGLIPtin Take 50 mg by mouth daily. Take along with Amaryl   Lantus 100 UNIT/ML injection Generic drug:  insulin glargine Inject 15 Units into the skin at bedtime.   levothyroxine 112 MCG tablet Commonly known as:   SYNTHROID Take 112 mcg by mouth daily before breakfast. 30 minutes before breakfast   linaclotide 145 MCG Caps capsule Commonly known as:  LINZESS Take 145 mcg by mouth daily.   metoprolol tartrate 25 MG tablet Commonly known as:  LOPRESSOR Take 1 tablet (25 mg total) by mouth 2 (two) times daily.   multivitamin with minerals Tabs tablet Take 1 tablet by mouth daily.   mupirocin ointment 2 % Commonly known as:  BACTROBAN Place 1 application into the nose 2 (two) times daily.   NovoLOG 100 UNIT/ML injection Generic drug:  insulin aspart Inject 4-8 Units into the skin 3 (three) times daily.   ondansetron 4 MG tablet Commonly known as:  ZOFRAN Take 1 tablet (4 mg total) by mouth every 6 (six) hours as needed for nausea.   oxyCODONE-acetaminophen 5-325 MG tablet Commonly known as:  PERCOCET/ROXICET Take 1 tablet by mouth every 6 (six) hours as needed for severe pain. What changed:    when to take this  reasons to take this   potassium chloride SA 20 MEQ tablet Commonly known as:  K-DUR Take 20 mEq by mouth daily.   simethicone 80 MG chewable tablet Commonly known as:  MYLICON Chew 80 mg by mouth every 6 (six) hours.   traMADol 50 MG tablet Commonly known as:  Ultram Take 1 tablet (50 mg total) by mouth every 6 (six) hours as needed for moderate pain.   traZODone 50 MG tablet Commonly known as:  DESYREL Take 50 mg by mouth at bedtime. For anxiety        DISCHARGE INSTRUCTIONS:  Follow-up with primary care physician in 3 days Outpatient palliative care please Follow-up with Dr. Rudene Christians in 10 days Continue wound care of the lower extremity wounds Home health PT RN DIET:  Carb modified, thin fluid consistent  DISCHARGE CONDITION:  Stable  ACTIVITY:  Activity as tolerated per PT  OXYGEN:  Home Oxygen: No.   Oxygen Delivery: room air  DISCHARGE LOCATION:  [facility] assisted living facility with home health  If you experience worsening of your admission  symptoms, develop shortness of breath, life threatening emergency, suicidal or homicidal thoughts you must seek medical attention immediately by calling 911 or calling your MD immediately  if symptoms less severe.  You Must read complete instructions/literature along with all the possible adverse reactions/side effects for all the Medicines you take and that have been prescribed to you. Take any new Medicines after you have completely understood and accpet all the possible adverse reactions/side effects.   Please note  You were cared for by a hospitalist during your hospital stay. If you have any questions about your discharge medications or the care you received while you were in the hospital after you are discharged, you can call the unit and asked to  speak with the hospitalist on call if the hospitalist that took care of you is not available. Once you are discharged, your primary care physician will handle any further medical issues. Please note that NO REFILLS for any discharge medications will be authorized once you are discharged, as it is imperative that you return to your primary care physician (or establish a relationship with a primary care physician if you do not have one) for your aftercare needs so that they can reassess your need for medications and monitor your lab values.     Today  Chief Complaint  Patient presents with  . Hypotension   Patient is doing fine.  Had kyphoplasty  ROS:  Review of system unobtainable from underlying dementia  VITAL SIGNS:  Blood pressure 109/61, pulse 67, temperature 97.7 F (36.5 C), temperature source Oral, resp. rate 16, height 5\' 5"  (1.651 m), weight 55.7 kg, SpO2 96 %.  I/O:    Intake/Output Summary (Last 24 hours) at 06/04/2018 1243 Last data filed at 06/04/2018 0900 Gross per 24 hour  Intake 120 ml  Output 500 ml  Net -380 ml    PHYSICAL EXAMINATION:  GENERAL:  85 y.o.-year-old patient lying in the bed with no acute distress.   EYES: Pupils equal, round, reactive to light and accommodation. No scleral icterus. Extraocular muscles intact.  HEENT: Head atraumatic, normocephalic. Oropharynx and nasopharynx clear.  NECK:  Supple, no jugular venous distention. No thyroid enlargement, no tenderness.  LUNGS: Normal breath sounds bilaterally, no wheezing, rales,rhonchi or crepitation. No use of accessory muscles of respiration.  CARDIOVASCULAR: S1, S2 normal. No murmurs, rubs, or gallops.  ABDOMEN: Soft, non-tender, non-distended. Bowel sounds present.  Back status post L1 kyphoplasty EXTREMITIES: No pedal edema, cyanosis, or clubbing.  NEUROLOGIC: Arousable but altered from underlying dementia gait not checked.  PSYCHIATRIC: The patient is alert and has chronic dementia SKIN: No obvious rash, lesion, or ulcer.   DATA REVIEW:   CBC Recent Labs  Lab 05/30/18 0439  WBC 7.2  HGB 11.2*  HCT 36.4  PLT 222    Chemistries  Recent Labs  Lab 05/30/18 0439 05/31/18 0503 06/03/18 1141  NA 140 139  --   K 3.4* 3.4* 4.3  CL 112* 111  --   CO2 21* 22  --   GLUCOSE 86 95  --   BUN 13 8  --   CREATININE 0.93 0.92  --   CALCIUM 7.9* 8.1*  --   MG 1.8  --   --     Cardiac Enzymes No results for input(s): TROPONINI in the last 168 hours.  Microbiology Results  Results for orders placed or performed during the hospital encounter of 05/28/18  Urine Culture     Status: Abnormal   Collection Time: 05/28/18  1:18 PM  Result Value Ref Range Status   Specimen Description   Final    URINE, RANDOM Performed at [facility], [address] [place], [place] [address]    Special Requests   Final    NONE Performed at [facility], [address] [place], [place] [address]    Culture >=100,000 COLONIES/mL ENTEROCOCCUS FAECALIS (A)  Final   Report Status 05/31/2018 FINAL  Final   Organism ID, Bacteria ENTEROCOCCUS FAECALIS (A)  Final      Susceptibility   Enterococcus faecalis - MIC*     AMPICILLIN <=2 SENSITIVE Sensitive     LEVOFLOXACIN >=8 RESISTANT Resistant     NITROFURANTOIN <=16 SENSITIVE Sensitive  VANCOMYCIN 1 SENSITIVE Sensitive     * >=100,000 COLONIES/mL ENTEROCOCCUS FAECALIS  SARS Coronavirus 2 (CEPHEID - Performed in [place] hospital lab), Hosp Order     Status: None   Collection Time: 05/28/18  1:48 PM  Result Value Ref Range Status   SARS Coronavirus 2 NEGATIVE NEGATIVE Final    Comment: (NOTE) If result is NEGATIVE SARS-CoV-2 target nucleic acids are NOT DETECTED. The SARS-CoV-2 RNA is generally detectable in upper and lower  respiratory specimens during the acute phase of infection. The lowest  concentration of SARS-CoV-2 viral copies this assay can detect is 250  copies / mL. A negative result does not preclude SARS-CoV-2 infection  and should not be used as the sole basis for treatment or other  patient management decisions.  A negative result may occur with  improper specimen collection / handling, submission of specimen other  than nasopharyngeal swab, presence of viral mutation(s) within the  areas targeted by this assay, and inadequate number of viral copies  (<250 copies / mL). A negative result must be combined with clinical  observations, patient history, and epidemiological information. If result is POSITIVE SARS-CoV-2 target nucleic acids are DETECTED. The SARS-CoV-2 RNA is generally detectable in upper and lower  respiratory specimens dur ing the acute phase of infection.  Positive  results are indicative of active infection with SARS-CoV-2.  Clinical  correlation with patient history and other diagnostic information is  necessary to determine patient infection status.  Positive results do  not rule out bacterial infection or co-infection with other viruses. If result is PRESUMPTIVE POSTIVE SARS-CoV-2 nucleic acids MAY BE PRESENT.   A presumptive positive result was obtained on the submitted specimen  and confirmed on repeat  testing.  While 2019 novel coronavirus  (SARS-CoV-2) nucleic acids may be present in the submitted sample  additional confirmatory testing may be necessary for epidemiological  and / or clinical management purposes  to differentiate between  SARS-CoV-2 and other Sarbecovirus currently known to infect humans.  If clinically indicated additional testing with an alternate test  methodology [phone]) is advised. The SARS-CoV-2 RNA is generally  detectable in upper and lower respiratory sp ecimens during the acute  phase of infection. The expected result is Negative. Fact Sheet for Patients:  StrictlyIdeas.no Fact Sheet for Healthcare Providers: BankingDealers.co.za This test is not yet approved or cleared by the [place] FDA and has been authorized for detection and/or diagnosis of SARS-CoV-2 by FDA under an Emergency Use Authorization (EUA).  This EUA will remain in effect (meaning this test can be used) for the duration of the COVID-19 declaration under Section 564(b)(1) of the Act, 21 U.S.C. section 360bbb-3(b)(1), unless the authorization is terminated or revoked sooner. Performed at [facility], [address] [place], [place] [address]   MRSA PCR Screening     Status: None   Collection Time: 05/29/18 12:16 AM  Result Value Ref Range Status   MRSA by PCR NEGATIVE NEGATIVE Final    Comment:        The GeneXpert MRSA Assay (FDA approved for NASAL specimens only), is one component of a comprehensive MRSA colonization surveillance program. It is not intended to diagnose MRSA infection nor to guide or monitor treatment for MRSA infections. Performed at [facility], [address], [place], [place] [address]   SARS Coronavirus 2 (CEPHEID - Performed in [facility] hospital lab), Hosp Order     Status: None   Collection Time: 06/03/18  1:28 PM  Result Value Ref Range Status   SARS Coronavirus 2 NEGATIVE  NEGATIVE Final    Comment: (NOTE) If result is NEGATIVE SARS-CoV-2 target nucleic acids are NOT DETECTED. The SARS-CoV-2 RNA is generally detectable in upper and lower  respiratory specimens during the acute phase of infection. The lowest  concentration of SARS-CoV-2 viral copies this assay can detect is 250  copies / mL. A negative result does not preclude SARS-CoV-2 infection  and should not be used as the sole basis for treatment or other  patient management decisions.  A negative result may occur with  improper specimen collection / handling, submission of specimen other  than nasopharyngeal swab, presence of viral mutation(s) within the  areas targeted by this assay, and inadequate number of viral copies  (<250 copies / mL). A negative result must be combined with clinical  observations, patient history, and epidemiological information. If result is POSITIVE SARS-CoV-2 target nucleic acids are DETECTED. The SARS-CoV-2 RNA is generally detectable in upper and lower  respiratory specimens dur ing the acute phase of infection.  Positive  results are indicative of active infection with SARS-CoV-2.  Clinical  correlation with patient history and other diagnostic information is  necessary to determine patient infection status.  Positive results do  not rule out bacterial infection or co-infection with other viruses. If result is PRESUMPTIVE POSTIVE SARS-CoV-2 nucleic acids MAY BE PRESENT.   A presumptive positive result was obtained on the submitted specimen  and confirmed on repeat testing.  While 2019 novel coronavirus  (SARS-CoV-2) nucleic acids may be present in the submitted sample  additional confirmatory testing may be necessary for epidemiological  and / or clinical management purposes  to differentiate between  SARS-CoV-2 and other Sarbecovirus currently known to infect humans.  If clinically indicated additional testing with an alternate test  methodology [phone]) is  advised. The SARS-CoV-2 RNA is generally  detectable in upper and lower respiratory sp ecimens during the acute  phase of infection. The expected result is Negative. Fact Sheet for Patients:  StrictlyIdeas.no Fact Sheet for Healthcare Providers: BankingDealers.co.za This test is not yet approved or cleared by the [place] FDA and has been authorized for detection and/or diagnosis of SARS-CoV-2 by FDA under an Emergency Use Authorization (EUA).  This EUA will remain in effect (meaning this test can be used) for the duration of the COVID-19 declaration under Section 564(b)(1) of the Act, 21 U.S.C. section 360bbb-3(b)(1), unless the authorization is terminated or revoked sooner. Performed at [facility], [address] [place], [place] [address]     RADIOLOGY:  Dg Lumbar Spine 2-3 Views  Result Date: 06/03/2018 CLINICAL DATA:  L1 kyphoplasty. EXAM: LUMBAR SPINE - 2-3 VIEW; DG C-ARM 61-120 MIN Radiation exposure index: 24.06 mGy. COMPARISON:  Radiographs of Jun 01, 2018. FINDINGS: Two intraoperative fluoroscopic images of the lumbar spine demonstrate the patient be status post kyphoplasty of L1 vertebral body. IMPRESSION: Fluoroscopic guidance provided during kyphoplasty of L1 vertebral body. Electronically Signed   By: Marijo Conception M.D.   On: 06/03/2018 09:16   Dg Lumbar Spine 2-3 Views  Result Date: 06/01/2018 CLINICAL DATA:  Lower back pain EXAM: LUMBAR SPINE - 2-3 VIEW COMPARISON:  CT of the pelvis dated 04/25/2018. FINDINGS: There is a new compression fracture of the L1 vertebral body with approximately 20% height loss anteriorly. Advanced degenerative changes are noted throughout the remaining portions of the lumbar spine. Bilateral common iliac stents are noted. The patient is status post bilateral total hip  arthroplasty. There is diffuse osteopenia. IMPRESSION: 1. Age-indeterminate compression fracture of the L1  vertebral body with approximately 20% height loss anteriorly. This is new since CT dated 04/25/2018. 2. Advanced degenerative changes throughout the lumbar spine. Osteopenia. Electronically Signed   By: Constance Holster M.D.   On: 06/01/2018 13:42   Dg C-arm 1-60 Min  Result Date: 06/03/2018 CLINICAL DATA:  L1 kyphoplasty. EXAM: LUMBAR SPINE - 2-3 VIEW; DG C-ARM 61-120 MIN Radiation exposure index: 24.06 mGy. COMPARISON:  Radiographs of Jun 01, 2018. FINDINGS: Two intraoperative fluoroscopic images of the lumbar spine demonstrate the patient be status post kyphoplasty of L1 vertebral body. IMPRESSION: Fluoroscopic guidance provided during kyphoplasty of L1 vertebral body. Electronically Signed   By: Marijo Conception M.D.   On: 06/03/2018 09:16    EKG:   Orders placed or performed during the hospital encounter of 05/28/18  . ED EKG  . ED EKG  . EKG 12-Lead  . EKG 12-Lead  . EKG 12-Lead  . EKG 12-Lead      Management plans discussed with the patient, patient's son Eric Form over phone and they are in agreement.  CODE STATUS:     Code Status Orders  (From admission, onward)         Start     Ordered   05/28/18 1711  Full code  Continuous     05/28/18 1710        Code Status History    Date Active Date Inactive Code Status Order ID Comments User Context   03/09/2018 0309 03/12/2018 1654 Full Code [id number]  Lance Coon, MD Inpatient   04/16/2017 1705 04/21/2017 2102 Full Code [id number]  Fritzi Mandes, MD Inpatient   03/23/2017 1422 03/23/2017 2233 Full Code [id number]  Gladstone Lighter, MD Inpatient   03/20/2017 1121 03/23/2017 1422 DNR [id number]  Bettey Costa, MD Inpatient   03/20/2017 0140 03/20/2017 1121 Full Code [id number]  Lance Coon, MD ED   07/07/2015 0353 07/09/2015 1520 Full Code [id number]  Saundra Shelling, MD Inpatient   02/07/2015 1605 02/12/2015 1921 Full Code [id number]  Dustin Flock, MD Inpatient   11/05/2014 1551 11/09/2014 1536 Full Code [id number]  Gladstone Lighter,  MD Inpatient   10/10/2014 1112 10/17/2014 1750 Full Code [id number]  Epifanio Lesches, MD Inpatient    Advance Directive Documentation     Most Recent Value  Type of Advance Directive  Healthcare Power of Attorney  Pre-existing out of facility DNR order (yellow form or pink MOST form)  -  "MOST" Form in Place?  -      TOTAL TIME TAKING CARE OF THIS PATIENT: 40  minutes.   Note: This dictation was prepared with Dragon dictation along with smaller phrase technology. Any transcriptional errors that result from this process are unintentional.   @[facility] @  on 06/04/2018 at 12:43 PM  Between 7am to 6pm - Pager - [phone]  After 6pm go to [email or web address] - password [email or web address]  Office  [phone]  CC: Primary care physician; Rusty Aus, MD

## 2018-06-04 NOTE — TOC Progression Note (Signed)
Transition of Care [facility]) - Progression Note    Patient Details  Name: Sharon [name] MRN: [id number] Date of Birth: 28-Sep-1933  Transition of Care [facility]) CM/SW Contact  Elza Rafter, RN Phone Number: 06/04/2018, 8:44 AM  Clinical Narrative:   Son [name] would like to see if [facility] in [place], [place] can accept patient.  Spoke with the admissions coordinator there this morning and they do not take [facility]TA.    Expected Discharge Plan: Home/Self Care Barriers to Discharge: Continued Medical Work up  Expected Discharge Plan and Services Expected Discharge Plan: Home/Self Care   Discharge Planning Services: CM Consult   Living arrangements for the past 2 months: Single Family Home Expected Discharge Date: 06/03/18                                     Social Determinants of Health (SDOH) Interventions    Readmission Risk Interventions Readmission Risk Prevention Plan 05/29/2018  Transportation Screening Complete  Medication Review Press photographer) Complete  Palliative Care Screening Not [place] Not Applicable  Some recent data might be hidden

## 2018-06-04 NOTE — Plan of Care (Signed)
Pt being transferred to SNF.   Problem: Education: Goal: Knowledge of General Education information will improve Description Including pain rating scale, medication(s)/side effects and non-pharmacologic comfort measures Outcome: Completed/Met   Problem: Health Behavior/Discharge Planning: Goal: Ability to manage health-related needs will improve Outcome: Completed/Met   Problem: Clinical Measurements: Goal: Ability to maintain clinical measurements within normal limits will improve Outcome: Completed/Met Goal: Will remain free from infection Outcome: Completed/Met Goal: Diagnostic test results will improve Outcome: Completed/Met Goal: Respiratory complications will improve Outcome: Completed/Met Goal: Cardiovascular complication will be avoided Outcome: Completed/Met   Problem: Activity: Goal: Risk for activity intolerance will decrease Outcome: Completed/Met   Problem: Nutrition: Goal: Adequate nutrition will be maintained Outcome: Completed/Met   Problem: Coping: Goal: Level of anxiety will decrease Outcome: Completed/Met   Problem: Elimination: Goal: Will not experience complications related to bowel motility Outcome: Completed/Met Goal: Will not experience complications related to urinary retention Outcome: Completed/Met   Problem: Pain Managment: Goal: General experience of comfort will improve Outcome: Completed/Met   Problem: Safety: Goal: Ability to remain free from injury will improve Outcome: Completed/Met   Problem: Skin Integrity: Goal: Risk for impaired skin integrity will decrease Outcome: Completed/Met

## 2018-06-04 NOTE — TOC Transition Note (Signed)
Transition of Care [facility]) - CM/SW Discharge Note   Patient Details  Name: Sharon [name] MRN: [id number] Date of Birth: [birth year]  Transition of Care [facility]) CM/SW Contact:  Elza Rafter, RN Phone Number: 06/04/2018, 3:19 PM   Clinical Narrative:   Insurance authorization has been overturned.  Dr. Amalia Hailey is approving for STR after reviewing therapy notes today.  Son [name] is aware and very pleased.  Authorization number from [facility] is [id number].  Juliann Pulse will call me with room number.  Report number is [phone].  Packet placed on chart.  All documents sent via HUB to facility.  Patient should be able to DC today via EMS.     Final next level of care: Skilled Nursing Facility Barriers to Discharge: No Barriers Identified   Patient Goals and CMS Choice Patient states their goals for this hospitalization and ongoing recovery are:: sons Herbalist Medicare.gov Compare Post Acute Care list provided to:: Patient Choice offered to / list presented to : Patient  Discharge Placement              Patient chooses bed at: [facility] Patient to be transferred to facility by: EMS Name of family member notified: Marshall-son Patient and family notified of of transfer: 06/04/18  Discharge Plan and Services   Discharge Planning Services: CM Consult                                 Social Determinants of Health ([place]) Interventions     Readmission Risk Interventions Readmission Risk Prevention Plan 05/29/2018  Transportation Screening Complete  Medication Review (RN Care Manager) Complete  [place] Not Applicable  Some recent data might be hidden

## 2018-06-04 NOTE — TOC Progression Note (Signed)
Transition of Care [facility]) - Progression Note    Patient Details  Name: Sharon [name] MRN: [id number] Date of Birth: Jun 12, 1933  Transition of Care [facility]) CM/SW Contact  Elza Rafter, RN Phone Number: 06/04/2018, 2:31 PM  Clinical Narrative:   Insurance denied for STR.  Called [facility] to confirm baseline status.  Per [name] at [place] patient was minimal assist to wheelchair, independent with feeding, standby assist with ambulation, A&O X 2-3.  [name] does not feel patient needs custodial care at this point.  They feel she would greatly benefit from STR.  Notified Dr. Amalia Hailey and asked PT to work with patient again today.  Will ask Dr. Amalia Hailey to review after PT.      Expected Discharge Plan: Home/Self Care Barriers to Discharge: Continued Medical Work up  Expected Discharge Plan and Services Expected Discharge Plan: Home/Self Care   Discharge Planning Services: CM Consult   Living arrangements for the past 2 months: Single Family Home Expected Discharge Date: 06/03/18                                     Social Determinants of Health (SDOH) Interventions    Readmission Risk Interventions Readmission Risk Prevention Plan 05/29/2018  Transportation Screening Complete  Medication Review Press photographer) Complete  Palliative Care Screening Not [place] Not Applicable  Some recent data might be hidden

## 2018-06-04 NOTE — Progress Notes (Signed)
Report called to Donna Christen, LPN at [facility].  Non-emergent EMS transfer arranged.  Pt prepped for transfer.

## 2018-06-04 NOTE — Consult Note (Signed)
[place] Nurse wound follow up Wound type:Partial thickness abrasion to left dorsal foot and heel when replacing weekly unna boots today.  Measurement:0.3 cm and 0.4 cm round lesions to left dorsal foot, depth 0.2 cm each.  Left heel with 1 cm round partial thickness lesion.  Had alginate adhered to wound bed, not a new wound.  (from skilled facility) Wound IXB:OERQ and moist Drainage (amount, consistency, odor) minimal serosanguinous noted on alginate.  This dressing was quite adherent to wound bed. Will replace with moist topical therapy.  Periwound: Chronic skin changes.  No edema noted in lower legs.  Dressing procedure/placement/frequency: Cleanse legs with soap and water and pat dry.  Apply Xeroform gauze to wound bed on left leg wounds.  Wrap with zinc layer and secure with self adherent coban.  Change weekly on Friday.  Will not follow at this time.  Please re-consult if needed.  Domenic Moras MSN, RN, FNP-BC CWON Wound, Ostomy, Continence Nurse Pager [phone]

## 2018-06-04 NOTE — Progress Notes (Signed)
Physical Therapy Treatment Patient Details Name: Sharon [name] MRN: [id number] DOB: March 12, 1933 Today's Date: 06/04/2018    History of Present Illness Patient is a pleasantly confused 85 year old female with a known history of a fib, DM, PVD, right first toe ray amputation (2019), HLD, CVA, CKD, sacral ulcers, neuropathy, osteomyelitis, stroke,  and lymphedema. She presented to the ED from her ALF [facility] for hypotension and recieved a kyphoplasty on 5/21 for L1. Patient is not a good historian requiring history to be obtained from son and previous documentation.     PT Comments    Patient demonstrates significant gains in mobility compared to physical therapy session yesterday. Patient demonstrated improved cognition with the ability to follow commands and redirection. Patient is very fearful and resistant to motion however was able to perform supine to sit EOB with HOB elevated with Min A which is a significant improvement from the previous max A required. Patient did not perform transfer due to agitation however was able to perform supine strengthening/ROM interventions. Information from case manager indicates potential misdirection from previous documentation/reports of patients PLOF. According to facility patient was walking with SBA and Min A for transfers, she was able to feed self and was alert and oriented x 2-3. Patient will continue to benefit from skilled PT and placement in SNF upon discharge.    Follow Up Recommendations  SNF;Other (comment)(memory care)     Equipment Recommendations  None recommended by PT    Recommendations for Other Services       Precautions / Restrictions Precautions Precautions: Fall Restrictions Weight Bearing Restrictions: Yes Other Position/Activity Restrictions: WBAT per [place]    Mobility  Bed Mobility Overal bed mobility: Needs Assistance Bed Mobility: Supine to Sit;Sit to Supine;Rolling Rolling: Modified independent (Device/Increase  time)   Supine to sit: HOB elevated;Min assist Sit to supine: HOB elevated;Min assist   General bed mobility comments: Patient not willing intially to perform supine to sit however after initiation of task was able to demonstrate movement with Min A demonstrating large progress compared to yesterdays evaluation.  Able to roll L and R in bed with UE usage with Mod I.   Transfers                 General transfer comment: unable to safely perform due to patient's pain and agitation  Ambulation/Gait             General Gait Details: unable to safely perform    Stairs             Wheelchair Mobility    Modified Rankin (Stroke Patients Only)       Balance Overall balance assessment: Needs assistance Sitting-balance support: Bilateral upper extremity supported;Feet supported Sitting balance-Leahy Scale: Poor Sitting balance - Comments: able to maintain with Min A however is painful so does not tolerate prolonged positioning Postural control: Posterior lean                                  Cognition Arousal/Alertness: Awake/alert Behavior During Therapy: Anxious;Flat affect;Agitated Overall Cognitive Status: No family/caregiver present to determine baseline cognitive functioning                                 General Comments: Patient alert and oriented, states she prefers to move in the morning not afternoon. Follows commands  Exercises General Exercises - Lower Extremity Hip ABduction/ADduction: Strengthening;AROM;Both;5 reps Straight Leg Raises: AROM;Strengthening;Both;5 reps Other Exercises Other Exercises: education on spinal precautions, need for sitting EOB for positioning.  Other Exercises: supine interventions performed with patient demonstrating ability to follow commands, reports feet are painful Other Exercises: rolling L and R to get off bedpan and perform cleanup with Mod I    General Comments General  comments (skin integrity, edema, etc.): unna boots, wounds on toes of L foot      Pertinent Vitals/Pain Pain Assessment: Faces Faces Pain Scale: Hurts little more Pain Location: with movement Pain Descriptors / Indicators: Aching;Discomfort Pain Intervention(s): Limited activity within patient's tolerance;Repositioned;Monitored during session    Home Living                      Prior Function            PT Goals (current goals can now be found in the care plan section) Progress towards PT goals: (to have less pain)    Frequency    Min 2X/week      PT Plan Current plan remains appropriate    Co-evaluation              AM-PAC PT "6 Clicks" Mobility   Outcome Measure  Help needed turning from your back to your side while in a flat bed without using bedrails?: A Little Help needed moving from lying on your back to sitting on the side of a flat bed without using bedrails?: A Little Help needed moving to and from a bed to a chair (including a wheelchair)?: A Lot Help needed standing up from a chair using your arms (e.g., wheelchair or bedside chair)?: A Lot Help needed to walk in hospital room?: Total Help needed climbing 3-5 steps with a railing? : Total 6 Click Score: 12    End of Session   Activity Tolerance: Patient tolerated treatment well;Patient limited by pain Patient left: in bed;with bed alarm set;with nursing/sitter in room Nurse Communication: Mobility status;Other (comment)(increased ability to follow commands, progress of mobility) PT Visit Diagnosis: Other abnormalities of gait and mobility (R26.89);Muscle weakness (generalized) (M62.81);Difficulty in walking, not elsewhere classified (R26.2);Adult, failure to thrive (R62.7);Pain;Unsteadiness on feet (R26.81) Pain - part of body: (back)     Time: 2956-2130 PT Time Calculation (min) (ACUTE ONLY): 13 min  Charges:  $Therapeutic Exercise: 8-22 mins                     Janna Arch, PT,  DPT     Janna Arch 06/04/2018, 2:52 PM

## 2018-06-04 NOTE — Progress Notes (Signed)
Patient ID: Sharon [name], female   DOB: Mar 31, 1933, 85 y.o.   MRN: [id number] Spoke with Dr Amalia Hailey for peer to peer. He is not in favor of approving for rehab given pt's functionality, chronic leg wounds. D/cw CM

## 2018-06-04 NOTE — Plan of Care (Signed)
PMT note:  Patient is discharging to facility. Recommend palliative to follow for further [place] discussion if son is amenable.

## 2018-06-04 NOTE — Progress Notes (Addendum)
Daily Progress Note   Patient Name: Sharon [name]       Date: 06/04/2018 DOB: 1933-06-22  Age: 85 y.o. MRN#: [id number] Attending Physician: Fritzi Mandes, MD Primary Care Physician: Rusty Aus, MD Admit Date: 05/28/2018  Reason for Consultation/Follow-up: Support  Subjective: In to speak with patient. She appears much less confused. She states she is frustrated that she is still here and is ready to discharge. She states she has not gotten out of bed. She states she has not been drinking fluids because she cannot urinate laying down. Staff made aware. Working on placement.   Length of Stay: 7  Current Medications: Scheduled Meds:  . acetaminophen  1,000 mg Oral QHS  . amiodarone  200 mg Oral Daily  . atorvastatin  10 mg Oral Q2200  . bisacodyl  5 mg Oral Daily  . cholecalciferol  1,000 Units Oral Daily  . collagenase   Topical Daily  . docusate sodium  100 mg Oral BID  . feeding supplement (NEPRO CARB STEADY)  237 mL Oral BID BM  . ferrous sulfate  325 mg Oral Daily  . furosemide  20 mg Oral Daily  . insulin aspart  0-5 Units Subcutaneous QHS  . insulin aspart  0-9 Units Subcutaneous TID WC  . insulin aspart  3 Units Subcutaneous TID WC  . insulin glargine  15 Units Subcutaneous QHS  . insulin glargine  5 Units Subcutaneous Once  . levothyroxine  112 mcg Oral QAC breakfast  . linaclotide  145 mcg Oral Daily  . linagliptin  5 mg Oral Daily  . metoprolol tartrate  25 mg Oral BID  . multivitamin with minerals  1 tablet Oral Daily  . nitroGLYCERIN  0.5 inch Topical Q6H  . simethicone  80 mg Oral Q6H  . sodium chloride flush  3 mL Intravenous Q12H  . vitamin C  250 mg Oral BID    Continuous Infusions:   PRN Meds: albuterol, bisacodyl, magnesium citrate, magnesium  hydroxide, metoCLOPramide **OR** metoCLOPramide (REGLAN) injection, mupirocin ointment, ondansetron **OR** ondansetron (ZOFRAN) IV, oxyCODONE-acetaminophen, polyethylene glycol, QUEtiapine, traZODone  Physical Exam Pulmonary:     Effort: Pulmonary effort is normal.  Neurological:     Mental Status: She is alert.             Vital Signs: BP 109/61 (BP  Location: Right Arm)   Pulse 67   Temp 97.7 F (36.5 C) (Oral)   Resp 16   Ht 5\' 5"  (1.651 m)   Wt 55.7 kg   SpO2 96%   BMI 20.43 kg/m  SpO2: SpO2: 96 % O2 Device: O2 Device: Room Air O2 Flow Rate:    Intake/output summary:   Intake/Output Summary (Last 24 hours) at 06/04/2018 1321 Last data filed at 06/04/2018 0900 Gross per 24 hour  Intake 120 ml  Output 500 ml  Net -380 ml   LBM: Last BM Date: 05/31/18 Baseline Weight: Weight: 70 kg Most recent weight: Weight: 55.7 kg       Palliative Assessment/Data:    Flowsheet Rows     Most Recent Value  Intake Tab  Referral Department  Hospitalist  Unit at Time of Referral  Cardiac/Telemetry Unit  Palliative Care Primary Diagnosis  Cardiac  Date Notified  06/01/18  Palliative Care Type  New Palliative care  Reason for referral  Clarify Goals of Care  Date of Admission  05/28/18  Date first seen by Palliative Care  06/02/18  # of days Palliative referral response time  1 Day(s)  # of days IP prior to Palliative referral  4  Clinical Assessment  Psychosocial & Spiritual Assessment  Palliative Care Outcomes      Patient Active Problem List   Diagnosis Date Noted  . Hypotension 05/28/2018  . Lymphedema 03/24/2018  . Pressure injury of skin 03/12/2018  . Atherosclerosis of native arteries of the extremities with ulceration ([place]) 05/07/2017  . Non-healing ulcer ([place]) 04/16/2017  . Diabetic neuropathy ([place]) 04/07/2017  . Septic shock ([place]) 03/20/2017  . CKD (chronic kidney disease), stage III ([place]) 03/19/2017  . Goals of care, counseling/discussion 03/03/2017  . PAD  (peripheral artery disease) ([place]) 02/03/2017  . Abdominal aortic stenosis 02/03/2017  . Bilateral lower extremity edema 02/03/2017  . Anemia 01/29/2017  . B12 deficiency 01/29/2017  . Pancreatic mass 01/29/2017  . Hyponatremia 07/07/2015  . Foot ulcer ([place]) 02/07/2015  . Acute on chronic renal failure ([place]) 11/05/2014  . Diabetic foot infection ([place]) 10/10/2014  . Chronic diastolic heart failure ([place]) 05/31/2014  . HTN (hypertension) 05/31/2014  . DM (diabetes mellitus), type 2, uncontrolled ([place]) 05/31/2014    Palliative Care Assessment & Plan    Recommendations/Plan:  Recommend palliative at D/C.    Code Status:    Code Status Orders  (From admission, onward)         Start     Ordered   05/28/18 1711  Full code  Continuous     05/28/18 1710        Code Status History    Date Active Date Inactive Code Status Order ID Comments User Context   03/09/2018 0309 03/12/2018 1654 Full Code [id number]  Lance Coon, MD Inpatient   04/16/2017 1705 04/21/2017 2102 Full Code [id number]  Fritzi Mandes, MD Inpatient   03/23/2017 1422 03/23/2017 2233 Full Code [id number]  Gladstone Lighter, MD Inpatient   03/20/2017 1121 03/23/2017 1422 DNR [id number]  Bettey Costa, MD Inpatient   03/20/2017 0140 03/20/2017 1121 Full Code [id number]  Lance Coon, MD ED   07/07/2015 0353 07/09/2015 1520 Full Code [id number]  Saundra Shelling, MD Inpatient   02/07/2015 1605 02/12/2015 1921 Full Code [id number]  Dustin Flock, MD Inpatient   11/05/2014 1551 11/09/2014 1536 Full Code [id number]  Gladstone Lighter, MD Inpatient   10/10/2014 1112 10/17/2014 1750 Full Code [id number]  Epifanio Lesches, MD Inpatient    Advance  Directive Documentation     Most Recent Value  Type of Advance Directive  Healthcare Power of Attorney  Pre-existing out of facility DNR order (yellow form or pink MOST form)  -  "MOST" Form in Place?  -       Prognosis:   Unable to determine  Discharge Planning:  [place] for  rehab with Palliative care service follow-up  Care plan was discussed with RN, charge RN  Thank you for allowing the Palliative Medicine Team to assist in the care of this patient.   Total Time 25 min Prolonged Time Billed no       Greater than 50%  of this time was spent counseling and coordinating care related to the above assessment and plan.  Asencion Gowda, NP  Please contact Palliative Medicine Team phone at [phone] for questions and concerns.

## 2018-06-04 NOTE — Progress Notes (Signed)
Notified MD of pt's blood sugar. Order to modify the Lantus. In to give pt medications and pt refused all of her medicines. Attempted to explain to pt the importance of her medications and taking them. Pt's response was, "I don't think that I need any medications tonight".  Will continue to monitor and assess.

## 2018-06-04 NOTE — TOC Transition Note (Signed)
Transition of Care [facility]) - CM/SW Discharge Note   Patient Details  Name: ALIZ MERITT MRN: [id number] Date of Birth: 1933-03-28  Transition of Care [facility]) CM/SW Contact:  Elza Rafter, RN Phone Number: 06/04/2018, 11:42 AM   Clinical Narrative:   [name] would like me to contact [facility] healthcare and [facility].  Reached out to both coordinators; waiting on call back.    Final next level of care: Assisted Living Barriers to Discharge: Continued Medical Work up   Patient Goals and CMS Choice   CMS Medicare.gov Compare Post Acute Care list provided to:: Patient Choice offered to / list presented to : Patient  Discharge Placement                       Discharge Plan and Services   Discharge Planning Services: CM Consult                                 Social Determinants of Health (SDOH) Interventions     Readmission Risk Interventions Readmission Risk Prevention Plan 05/29/2018  Transportation Screening Complete  Medication Review (RN Care Manager) Complete  [place] Not Applicable  Some recent data might be hidden

## 2018-06-04 NOTE — TOC Progression Note (Signed)
Transition of Care [facility]) - Progression Note    Patient Details  Name: Sharon [name] MRN: [id number] Date of Birth: September 04, 1933  Transition of Care [facility]) CM/SW Contact  Elza Rafter, RN Phone Number: 06/04/2018, 9:44 AM  Clinical Narrative:   Son [name] is aware that [facility] cannot accept patient.  He is going to call Merry Proud at [facility] in [place] and speak to hime as he is unsure if he wants his mother to go there with the previous COVID outbreak.  Started Ship broker with [facility].      Expected Discharge Plan: Home/Self Care Barriers to Discharge: Continued Medical Work up  Expected Discharge Plan and Services Expected Discharge Plan: Home/Self Care   Discharge Planning Services: CM Consult   Living arrangements for the past 2 months: Single Family Home Expected Discharge Date: 06/03/18                                     Social Determinants of Health (SDOH) Interventions    Readmission Risk Interventions Readmission Risk Prevention Plan 05/29/2018  Transportation Screening Complete  Medication Review Press photographer) Complete  Palliative Care Screening Not [place] Not Applicable  Some recent data might be hidden

## 2018-06-04 NOTE — Plan of Care (Signed)
  Problem: Clinical Measurements: Goal: Will remain free from infection Outcome: Progressing   Problem: Pain Managment: Goal: General experience of comfort will improve Outcome: Progressing   Problem: Skin Integrity: Goal: Risk for impaired skin integrity will decrease Outcome: Progressing

## 2018-06-06 DIAGNOSIS — N189 Chronic kidney disease, unspecified: Secondary | ICD-10-CM | POA: Diagnosis not present

## 2018-06-08 ENCOUNTER — Other Ambulatory Visit: Payer: Self-pay | Admitting: *Deleted

## 2018-06-08 NOTE — Patient Outreach (Signed)
[facility]) Care Management [place] Coordination  06/08/2018  Kimimila Tauzin Petron 1933-11-04 [id number]  Successful telephone outreach for care coordination to [facility] RN CM PAC Marthenia Rolling [phone])  Today, Orrin Brigham confirmed that she has verified in [place] that member is currently at [facility] SNF post-hospital discharge and that patient is listed as [facility] non-engaged on Patient [place].  Orrin Brigham confirmed  [facility] will follow for care management needs post SNF discharge.  Plan:  Will make patient inactive with [place] as she will be followed by an external program  Oneta Rack, RN, BSN, [employer] Coordinator [facility] Care Management  [phone]

## 2018-06-08 NOTE — Patient Outreach (Signed)
Telephone called received from [facility], [place].   Discussed with [place] Management RNCM that writer has verified in [place] that member is currently at [facility]. She is also listed as [facility] non-engaged on Patient Ping.  Therefore, [facility] will follow for care management needs post SNF discharge.  Will sign off.  Marthenia Rolling, MSN-Ed, RN,BSN [place] Acute Care Coordinator [phone]

## 2018-06-09 DIAGNOSIS — J449 Chronic obstructive pulmonary disease, unspecified: Secondary | ICD-10-CM | POA: Diagnosis not present

## 2018-06-09 DIAGNOSIS — I6930 Unspecified sequelae of cerebral infarction: Secondary | ICD-10-CM | POA: Diagnosis not present

## 2018-06-09 DIAGNOSIS — R0989 Other specified symptoms and signs involving the circulatory and respiratory systems: Secondary | ICD-10-CM | POA: Diagnosis not present

## 2018-06-09 DIAGNOSIS — R05 Cough: Secondary | ICD-10-CM | POA: Diagnosis not present

## 2018-06-09 NOTE — Anesthesia Postprocedure Evaluation (Signed)
Anesthesia Post Note  Patient: [place]ett Farm Szeliga  Procedure(s) Performed: KYPHOPLASTY L1 (N/A Back)  Patient location during evaluation: PACU Anesthesia Type: MAC Level of consciousness: awake and alert Pain management: pain level controlled Vital Signs Assessment: post-procedure vital signs reviewed and stable Respiratory status: spontaneous breathing, nonlabored ventilation, respiratory function stable and patient connected to nasal cannula oxygen Cardiovascular status: blood pressure returned to baseline and stable Postop Assessment: no apparent nausea or vomiting Anesthetic complications: no     Last Vitals:  Vitals:   06/04/18 1710 06/04/18 2114  BP: 115/68 (!) 127/94  Pulse: (!) 52 68  Resp:  18  Temp: 36.8 C 36.4 C  SpO2: 99% 97%    Last Pain:  Vitals:   06/04/18 2114  TempSrc: Oral  PainSc:                  Molli Barrows

## 2018-06-10 DIAGNOSIS — L89623 Pressure ulcer of left heel, stage 3: Secondary | ICD-10-CM | POA: Diagnosis not present

## 2018-06-10 DIAGNOSIS — L89613 Pressure ulcer of right heel, stage 3: Secondary | ICD-10-CM | POA: Diagnosis not present

## 2018-06-11 ENCOUNTER — Other Ambulatory Visit: Payer: Self-pay

## 2018-06-11 ENCOUNTER — Non-Acute Institutional Stay: Payer: PPO | Admitting: Adult Health Nurse Practitioner

## 2018-06-11 DIAGNOSIS — Z515 Encounter for palliative care: Secondary | ICD-10-CM | POA: Diagnosis not present

## 2018-06-11 NOTE — Progress Notes (Signed)
[facility] Consult Note Telephone: [phone]  Fax: [phone]  PATIENT NAME: Sharon [name] DOB: 1934/01/07 MRN: [id number]  PRIMARY CARE PROVIDER:   Rusty Aus, MD  REFERRING PROVIDER: Martinique Blattenberger NP  RESPONSIBLE PARTY:   Yeimi Debnam, son [phone]       RECOMMENDATIONS and PLAN:  1.   L1 compression fracture post kyphoplasty.  Patient had gone to ER on 05/28/2018 for a fall believed to be due to patient being overmedicated.  Patient found to have L1 compression fracture and underwent kyphoplasty on 5/21.  Staff does report that she will complain of pain with movement but once she is in position she is comfortable.  She is able to assist with ADLs and transfers but still requires assistance.  She denies pain at this time but does state that when she has pain it starts in her lower back and will radiate down the back of her legs. Patient also has diagnosis of neuropathy.  She is currently on Gabapentin 300 mg TID, Norco 5/325 TID and Percocet 5/325 Q6hrs PRN.  She does state that pain is relieved when she gets her pain meds.  Would not make any changes to pain regimen as it gives her relief and going up could cause oversedation that would increase her risk for falls.  Spoke with son who would like her to be weaned off her narcotics.  As she progresses through therapy, this would be a good goal.  Ongoing pain assessment will help determine when patient is able to tolerate weaning off the pain meds.  Spoke with referring provider who states that patient still does have a lot of pain and is not experiencing sedation from her current regimen.  2.  Chronic leg ulcers.  Patient has chronic ulcers to legs and is being seen at wound clinic for this.  Patient currently wearing unna boots.  Continue recommendations by wound clinic.  3.  Goals of care.  Spoke with patient about advanced care planning.  She voices wanting to have  everything done to prolong life.  Spoke with son who agrees with her.  Son somewhat irritated that his mom was referred to palliative care.  Explained our services and he was willing to listen to more of what we could offer.  He does not believe that his mother needs palliative/hospice services. Patient is okay with palliative services. His mother was a resident at [facility] and he hopes that she will be able to return there once her therapy is done.  Will continue to monitor her PRN while she is receiving therapy at the facility.  I spent 45 minutes providing this consultation,  from 9:15 to 10:00. More than 50% of the time in this consultation was spent coordinating communication.   HISTORY OF PRESENT ILLNESS:  Sharon [name] is a 85 y.o. year old female with multiple medical problems including a-fib, CHF, DMT2,L1 compression fracture post kyphoplasty. Palliative Care was asked to help address goals of care.   CODE STATUS: full  code  PPS: 50% HOSPICE ELIGIBILITY/DIAGNOSIS: TBD  PHYSICAL EXAM:   General: NAD, frail appearing, thin Cardiovascular: regular rate and rhythm Pulmonary: clear ant fields Abdomen: soft, nontender, + bowel sounds GU: no suprapubic tenderness Extremities: no edema, no joint deformities.  Does have unna boots to bilateral lower legs Skin: chronic wounds to lower extremities.  Unna boots in place Neurological: Weakness but otherwise nonfocal.  A&O x3 but staff reports that she will get  confused in the evenings.  PAST MEDICAL HISTORY:  Past Medical History:  Diagnosis Date  . Anemia   . Atrial fibrillation ([place])   . Cardiac arrest ([place])   . Cataract   . CHF (congestive heart failure) ([place])   . Diabetes mellitus without complication ([place])   . Edema    feet/ankles occas  . Hip fracture ([place])   . Hyperlipidemia   . Hypertension   . Hypothyroid   . Neuropathy   . Osteomyelitis ([place])    left first metatarsal  . Stroke [facility])     SOCIAL HX:  Social  History   Tobacco Use  . Smoking status: Former Smoker    Packs/day: 0.50    Years: 7.00    Pack years: 3.50    Types: Cigarettes    Last attempt to quit: 05/30/1989    Years since quitting: 29.0  . Smokeless tobacco: Never Used  Substance Use Topics  . Alcohol use: No    Alcohol/week: 0.0 standard drinks    ALLERGIES:  Allergies  Allergen Reactions  . Carbamazepine Other (See Comments)    Reaction: unknown  . Cymbalta [Duloxetine Hcl] Other (See Comments)    Confusion, disorientation  . Duloxetine     Other reaction(s): Other (See Comments) Confusion, disorientation  . Lyrica [Pregabalin] Other (See Comments)    Patient and son states this makes the patient confused.  . Temazepam     Other reaction(s): Hallucination     PERTINENT MEDICATIONS:  Outpatient Encounter Medications as of 06/11/2018  Medication Sig  . acetaminophen (TYLENOL) 500 MG tablet Take 1,000 mg by mouth at bedtime. For pain management  . acetaminophen (TYLENOL) 650 MG CR tablet Take 650 mg by mouth every 8 (eight) hours as needed. For discomfort  . albuterol (PROVENTIL) (2.5 MG/3ML) 0.083% nebulizer solution Take 3 mLs (2.5 mg total) by nebulization every 6 (six) hours as needed for wheezing or shortness of breath.  . Amino Acids-Protein Hydrolys (FEEDING SUPPLEMENT, PRO-STAT SUGAR FREE 64,) LIQD Take 30 mLs by mouth 2 (two) times daily between meals.  Marland Kitchen amiodarone (PACERONE) 200 MG tablet Take 1 tablet (200 mg total) by mouth daily.  Marland Kitchen atorvastatin (LIPITOR) 10 MG tablet Take 10 mg by mouth daily at 10 pm.   . bisacodyl (DULCOLAX) 5 MG EC tablet Take 5 mg by mouth daily.  . cholecalciferol (VITAMIN D) 1000 units tablet Take 1,000 Units by mouth daily.  . collagenase (SANTYL) ointment Apply topically daily.  Marland Kitchen docusate sodium (COLACE) 100 MG capsule Take 1 capsule (100 mg total) by mouth 2 (two) times daily as needed for mild constipation.  . ferrous sulfate 325 (65 FE) MG tablet Take 325 mg by mouth  daily.   . fluconazole (DIFLUCAN) 150 MG tablet Take 1 tablet (150 mg total) by mouth daily.  . furosemide (LASIX) 20 MG tablet Take 20 mg by mouth daily.   Marland Kitchen gabapentin (NEURONTIN) 300 MG capsule Take 300 mg by mouth 3 (three) times daily.  Marland Kitchen GLUCERNA (GLUCERNA) LIQD Take 237 mLs by mouth 2 (two) times daily between meals.  Marland Kitchen HYDROcodone-acetaminophen (NORCO/VICODIN) 5-325 MG tablet Take 1 tablet by mouth 3 (three) times daily.  . insulin aspart (NOVOLOG) 100 UNIT/ML injection Inject 4-8 Units into the skin 3 (three) times daily.  . insulin glargine (LANTUS) 100 UNIT/ML injection Inject 15 Units into the skin at bedtime.  Marland Kitchen JANUVIA 50 MG tablet Take 50 mg by mouth daily. Take along with Amaryl  . levothyroxine (SYNTHROID, LEVOTHROID) 112  MCG tablet Take 112 mcg by mouth daily before breakfast. 30 minutes before breakfast  . linaclotide (LINZESS) 145 MCG CAPS capsule Take 145 mcg by mouth daily.  . metoprolol tartrate (LOPRESSOR) 25 MG tablet Take 1 tablet (25 mg total) by mouth 2 (two) times daily.  . Multiple Vitamin (MULTIVITAMIN WITH MINERALS) TABS tablet Take 1 tablet by mouth daily.  . mupirocin ointment (BACTROBAN) 2 % Place 1 application into the nose 2 (two) times daily.  . ondansetron (ZOFRAN) 4 MG tablet Take 1 tablet (4 mg total) by mouth every 6 (six) hours as needed for nausea.  Marland Kitchen oxyCODONE-acetaminophen (PERCOCET/ROXICET) 5-325 MG tablet Take 1 tablet by mouth every 6 (six) hours as needed for severe pain.  . potassium chloride SA (K-DUR) 20 MEQ tablet Take 20 mEq by mouth daily.  . simethicone (MYLICON) 80 MG chewable tablet Chew 80 mg by mouth every 6 (six) hours.  . traMADol (ULTRAM) 50 MG tablet Take 1 tablet (50 mg total) by mouth every 6 (six) hours as needed for moderate pain.  . traZODone (DESYREL) 50 MG tablet Take 50 mg by mouth at bedtime. For anxiety  . vitamin C (VITAMIN C) 250 MG tablet Take 1 tablet (250 mg total) by mouth 2 (two) times daily.    Facility-Administered Encounter Medications as of 06/11/2018  Medication  . sodium chloride flush (NS) 0.9 % injection 10 mL      Yevonne Yokum Jenetta Downer, NP

## 2018-06-13 DIAGNOSIS — F419 Anxiety disorder, unspecified: Secondary | ICD-10-CM | POA: Diagnosis not present

## 2018-06-14 DIAGNOSIS — R627 Adult failure to thrive: Secondary | ICD-10-CM | POA: Diagnosis not present

## 2018-06-14 DIAGNOSIS — G8929 Other chronic pain: Secondary | ICD-10-CM | POA: Diagnosis not present

## 2018-06-14 DIAGNOSIS — M6281 Muscle weakness (generalized): Secondary | ICD-10-CM | POA: Diagnosis not present

## 2018-06-14 DIAGNOSIS — M545 Low back pain: Secondary | ICD-10-CM | POA: Diagnosis not present

## 2018-06-15 DIAGNOSIS — E1165 Type 2 diabetes mellitus with hyperglycemia: Secondary | ICD-10-CM | POA: Diagnosis not present

## 2018-06-15 DIAGNOSIS — R410 Disorientation, unspecified: Secondary | ICD-10-CM | POA: Diagnosis not present

## 2018-06-15 DIAGNOSIS — G8929 Other chronic pain: Secondary | ICD-10-CM | POA: Diagnosis not present

## 2018-06-15 DIAGNOSIS — M545 Low back pain: Secondary | ICD-10-CM | POA: Diagnosis not present

## 2018-06-17 DIAGNOSIS — R410 Disorientation, unspecified: Secondary | ICD-10-CM | POA: Diagnosis not present

## 2018-06-17 DIAGNOSIS — E871 Hypo-osmolality and hyponatremia: Secondary | ICD-10-CM | POA: Diagnosis not present

## 2018-06-17 DIAGNOSIS — L89153 Pressure ulcer of sacral region, stage 3: Secondary | ICD-10-CM | POA: Diagnosis not present

## 2018-06-17 DIAGNOSIS — L89613 Pressure ulcer of right heel, stage 3: Secondary | ICD-10-CM | POA: Diagnosis not present

## 2018-06-17 DIAGNOSIS — E1165 Type 2 diabetes mellitus with hyperglycemia: Secondary | ICD-10-CM | POA: Diagnosis not present

## 2018-06-17 DIAGNOSIS — N179 Acute kidney failure, unspecified: Secondary | ICD-10-CM | POA: Diagnosis not present

## 2018-06-17 DIAGNOSIS — L89623 Pressure ulcer of left heel, stage 3: Secondary | ICD-10-CM | POA: Diagnosis not present

## 2018-06-21 DIAGNOSIS — R410 Disorientation, unspecified: Secondary | ICD-10-CM | POA: Diagnosis not present

## 2018-06-21 DIAGNOSIS — R627 Adult failure to thrive: Secondary | ICD-10-CM | POA: Diagnosis not present

## 2018-06-21 DIAGNOSIS — N390 Urinary tract infection, site not specified: Secondary | ICD-10-CM | POA: Diagnosis not present

## 2018-06-21 DIAGNOSIS — R5383 Other fatigue: Secondary | ICD-10-CM | POA: Diagnosis not present

## 2018-06-21 DIAGNOSIS — N189 Chronic kidney disease, unspecified: Secondary | ICD-10-CM | POA: Diagnosis not present

## 2018-06-22 DIAGNOSIS — M5441 Lumbago with sciatica, right side: Secondary | ICD-10-CM | POA: Diagnosis not present

## 2018-06-22 DIAGNOSIS — M545 Low back pain: Secondary | ICD-10-CM | POA: Diagnosis not present

## 2018-06-22 DIAGNOSIS — E1129 Type 2 diabetes mellitus with other diabetic kidney complication: Secondary | ICD-10-CM | POA: Diagnosis not present

## 2018-06-22 DIAGNOSIS — I10 Essential (primary) hypertension: Secondary | ICD-10-CM | POA: Diagnosis not present

## 2018-06-22 DIAGNOSIS — E119 Type 2 diabetes mellitus without complications: Secondary | ICD-10-CM | POA: Diagnosis not present

## 2018-06-22 DIAGNOSIS — I480 Paroxysmal atrial fibrillation: Secondary | ICD-10-CM | POA: Diagnosis not present

## 2018-06-23 DIAGNOSIS — R5383 Other fatigue: Secondary | ICD-10-CM | POA: Diagnosis not present

## 2018-06-23 DIAGNOSIS — R410 Disorientation, unspecified: Secondary | ICD-10-CM | POA: Diagnosis not present

## 2018-06-23 DIAGNOSIS — R627 Adult failure to thrive: Secondary | ICD-10-CM | POA: Diagnosis not present

## 2018-06-23 DIAGNOSIS — N390 Urinary tract infection, site not specified: Secondary | ICD-10-CM | POA: Diagnosis not present

## 2018-06-24 DIAGNOSIS — R5383 Other fatigue: Secondary | ICD-10-CM | POA: Diagnosis not present

## 2018-06-24 DIAGNOSIS — R627 Adult failure to thrive: Secondary | ICD-10-CM | POA: Diagnosis not present

## 2018-06-24 DIAGNOSIS — R410 Disorientation, unspecified: Secondary | ICD-10-CM | POA: Diagnosis not present

## 2018-06-24 DIAGNOSIS — L89624 Pressure ulcer of left heel, stage 4: Secondary | ICD-10-CM | POA: Diagnosis not present

## 2018-06-24 DIAGNOSIS — L89153 Pressure ulcer of sacral region, stage 3: Secondary | ICD-10-CM | POA: Diagnosis not present

## 2018-06-24 DIAGNOSIS — R630 Anorexia: Secondary | ICD-10-CM | POA: Diagnosis not present

## 2018-06-27 DIAGNOSIS — Z139 Encounter for screening, unspecified: Secondary | ICD-10-CM | POA: Diagnosis not present

## 2018-06-28 DIAGNOSIS — R5383 Other fatigue: Secondary | ICD-10-CM | POA: Diagnosis not present

## 2018-06-28 DIAGNOSIS — R627 Adult failure to thrive: Secondary | ICD-10-CM | POA: Diagnosis not present

## 2018-06-28 DIAGNOSIS — R630 Anorexia: Secondary | ICD-10-CM | POA: Diagnosis not present

## 2018-06-28 DIAGNOSIS — N390 Urinary tract infection, site not specified: Secondary | ICD-10-CM | POA: Diagnosis not present

## 2018-06-28 DIAGNOSIS — R410 Disorientation, unspecified: Secondary | ICD-10-CM | POA: Diagnosis not present

## 2018-06-29 DIAGNOSIS — G4700 Insomnia, unspecified: Secondary | ICD-10-CM | POA: Diagnosis not present

## 2018-06-29 DIAGNOSIS — R54 Age-related physical debility: Secondary | ICD-10-CM | POA: Diagnosis not present

## 2018-06-29 DIAGNOSIS — R627 Adult failure to thrive: Secondary | ICD-10-CM | POA: Diagnosis not present

## 2018-06-29 DIAGNOSIS — L22 Diaper dermatitis: Secondary | ICD-10-CM | POA: Diagnosis not present

## 2018-06-30 DIAGNOSIS — Z139 Encounter for screening, unspecified: Secondary | ICD-10-CM | POA: Diagnosis not present

## 2018-07-01 DIAGNOSIS — L89624 Pressure ulcer of left heel, stage 4: Secondary | ICD-10-CM | POA: Diagnosis not present

## 2018-07-01 DIAGNOSIS — L89613 Pressure ulcer of right heel, stage 3: Secondary | ICD-10-CM | POA: Diagnosis not present

## 2018-07-01 DIAGNOSIS — L89153 Pressure ulcer of sacral region, stage 3: Secondary | ICD-10-CM | POA: Diagnosis not present

## 2018-07-02 DIAGNOSIS — R600 Localized edema: Secondary | ICD-10-CM | POA: Diagnosis not present

## 2018-07-02 DIAGNOSIS — R627 Adult failure to thrive: Secondary | ICD-10-CM | POA: Diagnosis not present

## 2018-07-02 DIAGNOSIS — G8929 Other chronic pain: Secondary | ICD-10-CM | POA: Diagnosis not present

## 2018-07-02 DIAGNOSIS — R54 Age-related physical debility: Secondary | ICD-10-CM | POA: Diagnosis not present

## 2018-07-06 ENCOUNTER — Other Ambulatory Visit: Payer: Self-pay

## 2018-07-06 ENCOUNTER — Non-Acute Institutional Stay: Payer: PPO | Admitting: Adult Health Nurse Practitioner

## 2018-07-06 DIAGNOSIS — G4700 Insomnia, unspecified: Secondary | ICD-10-CM | POA: Diagnosis not present

## 2018-07-06 DIAGNOSIS — F419 Anxiety disorder, unspecified: Secondary | ICD-10-CM | POA: Diagnosis not present

## 2018-07-06 DIAGNOSIS — R627 Adult failure to thrive: Secondary | ICD-10-CM | POA: Diagnosis not present

## 2018-07-06 DIAGNOSIS — Z515 Encounter for palliative care: Secondary | ICD-10-CM

## 2018-07-06 DIAGNOSIS — G8929 Other chronic pain: Secondary | ICD-10-CM | POA: Diagnosis not present

## 2018-07-06 NOTE — Progress Notes (Signed)
[facility] Consult Note Telephone: [phone]  Fax: [phone]  PATIENT NAME: Sharon [name] DOB: August 08, 1933 MRN: [id number]  PRIMARY CARE PROVIDER:   Rusty Aus, MD  REFERRING PROVIDER: Martinique Blattenberger NP  RESPONSIBLE PARTY:   Malini Flemings, son [phone]   Met with patient today and had care plan meeting with MDS director and discharge director from facility and son, [name], was in meeting via phone.  RECOMMENDATIONS and PLAN:  1.  L1 compression fracture post kyphoplasty.  Patient still being seen by therapy at facility.  Patient's own admission that she does not participate much because of pain in her back and from her sacral wound. Patient currently is on scheduled Tylenol and tramadol PRN for pain.  This apparently is not giving her much relief if she is unable to participate in therapy or her care.  She does state that she requires assistance with ADLs except she is able to feed herself.  Staff has stated that she often does need help with feeding as well. She also states that it hurts when she tries to sit up.  She is often found leaning to the left when she is sitting up and this could be due to the pain from her sacral wound.  Staff does report that son does not want her on narcotics and she has been weaned off the percocet she had been on.  May need to coordinate giving tramadol an hour before getting therapy and when doing extensive care or scheduling it 4 times a day to give better pain management so that she is able to participate more in her care.  2.  Pressure wounds.  Patient has chronic wounds and has been seen at wound clinic prior to hospitalization.  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 NP at facility states that this has been debrided so it seems like it has gotten worse but it hasn't.  She also has a stage III to right heel and sacrum.  NP states that these wounds have not gotten worse since being at  the facility.  She is being followed by a wound doctor who comes weekly and the facility staff follow his orders for wound care.  Continue current wound management.  Patient would benefit having an air mattress and gel cushions for her chair to help relieve pressure on her wounds.  This was discussed at closing of care plan meeting.  Patient can be started on Pro-Stat to help with wound healing if she will tolerate it.  3.  Nutrition.  Patient does not discuss her appetite. Staff does report that she has failure to thrive.  This was one of her diagnoses from the hospital.  She has been requiring more assistance with feeding and has been started on Glucerna supplemental drinks.  She needs continued encouragement and assistance with feeding.  She may need extra nutrition with Magic Cups as well.  Could start remeron to stimulate her appetite.  She definitely needs extra protein and calories to regain her strength and for wound healing.  Monitor weight weekly if not already doing so to get an accurate picture if patient is losing weight and how fast.    4.  Goals of care.  Patient is full code.  Son would like her to be able to regain her strength and have her wounds heal so she can transfer to an assisted living facility closer to him in [place].     I  spent 45 minutes providing this consultation,  from 3:00 to 3:45. More than 50% of the time in this consultation was spent coordinating communication.   HISTORY OF PRESENT ILLNESS:  Sharon [name] is a 85 y.o. year old female with multiple medical problems including a-fib, CHF, DMT2,L1 compression fracture post kyphoplasty. Palliative Care was asked to help address goals of care.   CODE STATUS: Full Code  PPS: 40% HOSPICE ELIGIBILITY/DIAGNOSIS: TBD  PHYSICAL EXAM:   General: NAD, frail appearing, thin Cardiovascular: regular rate and rhythm Pulmonary: clear ant fields Abdomen: soft, nontender, + bowel sounds GU: no suprapubic tenderness  Extremities: no edema, no joint deformities.  Skin: chronic wounds to lower extremities. Patient is being seen by wound doctor every week and facility staff carry out his treatment plan Neurological: Weakness but otherwise nonfocal.  Patient A&O x3.  She did not answer many ROS questions.  Mostly stated "I just don't know" but would talk about her pain PAST MEDICAL HISTORY:  Past Medical History:  Diagnosis Date  . Anemia   . Atrial fibrillation ([place])   . Cardiac arrest ([place])   . Cataract   . CHF (congestive heart failure) ([place])   . Diabetes mellitus without complication ([place])   . Edema    feet/ankles occas  . Hip fracture ([place])   . Hyperlipidemia   . Hypertension   . Hypothyroid   . Neuropathy   . Osteomyelitis ([place])    left first metatarsal  . Stroke [facility])     SOCIAL HX:  Social History   Tobacco Use  . Smoking status: Former Smoker    Packs/day: 0.50    Years: 7.00    Pack years: 3.50    Types: Cigarettes    Quit date: 05/30/1989    Years since quitting: 29.1  . Smokeless tobacco: Never Used  Substance Use Topics  . Alcohol use: No    Alcohol/week: 0.0 standard drinks    ALLERGIES:  Allergies  Allergen Reactions  . Carbamazepine Other (See Comments)    Reaction: unknown  . Cymbalta [Duloxetine Hcl] Other (See Comments)    Confusion, disorientation  . Duloxetine     Other reaction(s): Other (See Comments) Confusion, disorientation  . Lyrica [Pregabalin] Other (See Comments)    Patient and son states this makes the patient confused.  . Temazepam     Other reaction(s): Hallucination     PERTINENT MEDICATIONS:  Outpatient Encounter Medications as of 07/06/2018  Medication Sig  . acetaminophen (TYLENOL) 500 MG tablet Take 1,000 mg by mouth at bedtime. For pain management  . acetaminophen (TYLENOL) 650 MG CR tablet Take 650 mg by mouth every 8 (eight) hours as needed. For discomfort  . albuterol (PROVENTIL) (2.5 MG/3ML) 0.083% nebulizer solution Take 3 mLs (2.5  mg total) by nebulization every 6 (six) hours as needed for wheezing or shortness of breath.  . Amino Acids-Protein Hydrolys (FEEDING SUPPLEMENT, PRO-STAT SUGAR FREE 64,) LIQD Take 30 mLs by mouth 2 (two) times daily between meals.  Marland Kitchen amiodarone (PACERONE) 200 MG tablet Take 1 tablet (200 mg total) by mouth daily.  Marland Kitchen atorvastatin (LIPITOR) 10 MG tablet Take 10 mg by mouth daily at 10 pm.   . bisacodyl (DULCOLAX) 5 MG EC tablet Take 5 mg by mouth daily.  . cholecalciferol (VITAMIN D) 1000 units tablet Take 1,000 Units by mouth daily.  . collagenase (SANTYL) ointment Apply topically daily.  Marland Kitchen docusate sodium (COLACE) 100 MG capsule Take 1 capsule (100 mg total) by mouth 2 (two)  times daily as needed for mild constipation.  . ferrous sulfate 325 (65 FE) MG tablet Take 325 mg by mouth daily.   . fluconazole (DIFLUCAN) 150 MG tablet Take 1 tablet (150 mg total) by mouth daily.  . furosemide (LASIX) 20 MG tablet Take 20 mg by mouth daily.   Marland Kitchen gabapentin (NEURONTIN) 300 MG capsule Take 300 mg by mouth 3 (three) times daily.  Marland Kitchen GLUCERNA (GLUCERNA) LIQD Take 237 mLs by mouth 2 (two) times daily between meals.  Marland Kitchen HYDROcodone-acetaminophen (NORCO/VICODIN) 5-325 MG tablet Take 1 tablet by mouth 3 (three) times daily.  . insulin aspart (NOVOLOG) 100 UNIT/ML injection Inject 4-8 Units into the skin 3 (three) times daily.  . insulin glargine (LANTUS) 100 UNIT/ML injection Inject 15 Units into the skin at bedtime.  Marland Kitchen JANUVIA 50 MG tablet Take 50 mg by mouth daily. Take along with Amaryl  . levothyroxine (SYNTHROID, LEVOTHROID) 112 MCG tablet Take 112 mcg by mouth daily before breakfast. 30 minutes before breakfast  . linaclotide (LINZESS) 145 MCG CAPS capsule Take 145 mcg by mouth daily.  . metoprolol tartrate (LOPRESSOR) 25 MG tablet Take 1 tablet (25 mg total) by mouth 2 (two) times daily.  . Multiple Vitamin (MULTIVITAMIN WITH MINERALS) TABS tablet Take 1 tablet by mouth daily.  . mupirocin ointment  (BACTROBAN) 2 % Place 1 application into the nose 2 (two) times daily.  . ondansetron (ZOFRAN) 4 MG tablet Take 1 tablet (4 mg total) by mouth every 6 (six) hours as needed for nausea.  Marland Kitchen oxyCODONE-acetaminophen (PERCOCET/ROXICET) 5-325 MG tablet Take 1 tablet by mouth every 6 (six) hours as needed for severe pain.  . potassium chloride SA (K-DUR) 20 MEQ tablet Take 20 mEq by mouth daily.  . simethicone (MYLICON) 80 MG chewable tablet Chew 80 mg by mouth every 6 (six) hours.  . traMADol (ULTRAM) 50 MG tablet Take 1 tablet (50 mg total) by mouth every 6 (six) hours as needed for moderate pain.  . traZODone (DESYREL) 50 MG tablet Take 50 mg by mouth at bedtime. For anxiety  . vitamin C (VITAMIN C) 250 MG tablet Take 1 tablet (250 mg total) by mouth 2 (two) times daily.   Facility-Administered Encounter Medications as of 07/06/2018  Medication  . sodium chloride flush (NS) 0.9 % injection 10 mL      Merwyn Hodapp Jenetta Downer, NP

## 2018-07-07 ENCOUNTER — Telehealth: Payer: Self-pay

## 2018-07-07 DIAGNOSIS — F0391 Unspecified dementia with behavioral disturbance: Secondary | ICD-10-CM | POA: Diagnosis not present

## 2018-07-07 DIAGNOSIS — G8929 Other chronic pain: Secondary | ICD-10-CM | POA: Diagnosis not present

## 2018-07-07 DIAGNOSIS — F419 Anxiety disorder, unspecified: Secondary | ICD-10-CM | POA: Diagnosis not present

## 2018-07-07 DIAGNOSIS — L89153 Pressure ulcer of sacral region, stage 3: Secondary | ICD-10-CM | POA: Diagnosis not present

## 2018-07-07 NOTE — Telephone Encounter (Signed)
Received message that patient's son had called regarding a return call. Returned call to son. VM left with call back information

## 2018-07-08 DIAGNOSIS — F0391 Unspecified dementia with behavioral disturbance: Secondary | ICD-10-CM | POA: Diagnosis not present

## 2018-07-08 DIAGNOSIS — F419 Anxiety disorder, unspecified: Secondary | ICD-10-CM | POA: Diagnosis not present

## 2018-07-08 DIAGNOSIS — L89624 Pressure ulcer of left heel, stage 4: Secondary | ICD-10-CM | POA: Diagnosis not present

## 2018-07-08 DIAGNOSIS — L89613 Pressure ulcer of right heel, stage 3: Secondary | ICD-10-CM | POA: Diagnosis not present

## 2018-07-08 DIAGNOSIS — G8929 Other chronic pain: Secondary | ICD-10-CM | POA: Diagnosis not present

## 2018-07-08 DIAGNOSIS — L89153 Pressure ulcer of sacral region, stage 3: Secondary | ICD-10-CM | POA: Diagnosis not present

## 2018-07-08 DIAGNOSIS — L97319 Non-pressure chronic ulcer of right ankle with unspecified severity: Secondary | ICD-10-CM | POA: Diagnosis not present

## 2018-07-09 DIAGNOSIS — F419 Anxiety disorder, unspecified: Secondary | ICD-10-CM | POA: Diagnosis not present

## 2018-07-09 DIAGNOSIS — G8929 Other chronic pain: Secondary | ICD-10-CM | POA: Diagnosis not present

## 2018-07-09 DIAGNOSIS — I4891 Unspecified atrial fibrillation: Secondary | ICD-10-CM | POA: Diagnosis not present

## 2018-07-09 DIAGNOSIS — L89153 Pressure ulcer of sacral region, stage 3: Secondary | ICD-10-CM | POA: Diagnosis not present

## 2018-07-09 DIAGNOSIS — F064 Anxiety disorder due to known physiological condition: Secondary | ICD-10-CM | POA: Diagnosis not present

## 2018-07-09 DIAGNOSIS — F0391 Unspecified dementia with behavioral disturbance: Secondary | ICD-10-CM | POA: Diagnosis not present

## 2018-07-09 DIAGNOSIS — F5101 Primary insomnia: Secondary | ICD-10-CM | POA: Diagnosis not present

## 2018-07-09 DIAGNOSIS — E038 Other specified hypothyroidism: Secondary | ICD-10-CM | POA: Diagnosis not present

## 2018-07-09 DIAGNOSIS — I5032 Chronic diastolic (congestive) heart failure: Secondary | ICD-10-CM | POA: Diagnosis not present

## 2018-07-09 DIAGNOSIS — I2510 Atherosclerotic heart disease of native coronary artery without angina pectoris: Secondary | ICD-10-CM | POA: Diagnosis not present

## 2018-07-12 ENCOUNTER — Other Ambulatory Visit: Payer: Self-pay

## 2018-07-12 ENCOUNTER — Emergency Department: Payer: Self-pay

## 2018-07-12 ENCOUNTER — Emergency Department (HOSPITAL_COMMUNITY)
Admission: EM | Admit: 2018-07-12 | Discharge: 2018-07-12 | Disposition: A | Discharge status: Home / Self Care | Payer: PPO | Attending: Emergency Medicine | Admitting: Emergency Medicine

## 2018-07-12 ENCOUNTER — Encounter (HOSPITAL_COMMUNITY): Payer: Self-pay

## 2018-07-12 DIAGNOSIS — I499 Cardiac arrhythmia, unspecified: Secondary | ICD-10-CM | POA: Diagnosis not present

## 2018-07-12 DIAGNOSIS — L89153 Pressure ulcer of sacral region, stage 3: Secondary | ICD-10-CM | POA: Diagnosis not present

## 2018-07-12 DIAGNOSIS — R05 Cough: Secondary | ICD-10-CM | POA: Diagnosis not present

## 2018-07-12 DIAGNOSIS — L89152 Pressure ulcer of sacral region, stage 2: Secondary | ICD-10-CM | POA: Diagnosis not present

## 2018-07-12 DIAGNOSIS — Y712 Prosthetic and other implants, materials and accessory cardiovascular devices associated with adverse incidents: Secondary | ICD-10-CM | POA: Diagnosis not present

## 2018-07-12 DIAGNOSIS — I5032 Chronic diastolic (congestive) heart failure: Secondary | ICD-10-CM | POA: Diagnosis not present

## 2018-07-12 DIAGNOSIS — L89624 Pressure ulcer of left heel, stage 4: Secondary | ICD-10-CM | POA: Diagnosis not present

## 2018-07-12 DIAGNOSIS — R39198 Other difficulties with micturition: Secondary | ICD-10-CM | POA: Diagnosis not present

## 2018-07-12 DIAGNOSIS — T82328A Displacement of other vascular grafts, initial encounter: Secondary | ICD-10-CM | POA: Diagnosis not present

## 2018-07-12 DIAGNOSIS — I4891 Unspecified atrial fibrillation: Secondary | ICD-10-CM | POA: Insufficient documentation

## 2018-07-12 DIAGNOSIS — G8929 Other chronic pain: Secondary | ICD-10-CM | POA: Diagnosis not present

## 2018-07-12 DIAGNOSIS — N183 Chronic kidney disease, stage 3 (moderate): Secondary | ICD-10-CM | POA: Diagnosis not present

## 2018-07-12 DIAGNOSIS — L89109 Pressure ulcer of unspecified part of back, unspecified stage: Secondary | ICD-10-CM | POA: Diagnosis not present

## 2018-07-12 DIAGNOSIS — R5381 Other malaise: Secondary | ICD-10-CM | POA: Diagnosis not present

## 2018-07-12 DIAGNOSIS — Z794 Long term (current) use of insulin: Secondary | ICD-10-CM | POA: Diagnosis not present

## 2018-07-12 DIAGNOSIS — E039 Hypothyroidism, unspecified: Secondary | ICD-10-CM | POA: Diagnosis not present

## 2018-07-12 DIAGNOSIS — E1122 Type 2 diabetes mellitus with diabetic chronic kidney disease: Secondary | ICD-10-CM | POA: Insufficient documentation

## 2018-07-12 DIAGNOSIS — R339 Retention of urine, unspecified: Secondary | ICD-10-CM | POA: Diagnosis not present

## 2018-07-12 DIAGNOSIS — I130 Hypertensive heart and chronic kidney disease with heart failure and stage 1 through stage 4 chronic kidney disease, or unspecified chronic kidney disease: Secondary | ICD-10-CM | POA: Insufficient documentation

## 2018-07-12 DIAGNOSIS — L89614 Pressure ulcer of right heel, stage 4: Secondary | ICD-10-CM | POA: Diagnosis not present

## 2018-07-12 HISTORY — DX: Adult failure to thrive: R62.7

## 2018-07-12 HISTORY — DX: Peripheral vascular disease, unspecified: I73.9

## 2018-07-12 HISTORY — DX: Unspecified dementia, unspecified severity, without behavioral disturbance, psychotic disturbance, mood disturbance, and anxiety: F03.90

## 2018-07-12 HISTORY — DX: Chronic kidney disease, unspecified: N18.9

## 2018-07-12 MED ORDER — SODIUM CHLORIDE 0.9% FLUSH: 10.0000 mL | INTRAVENOUS | Status: DC | PRN

## 2018-07-12 MED ORDER — SODIUM CHLORIDE 0.9% FLUSH: 10.0000 mL | Freq: Two times a day (BID) | INTRAVENOUS | Status: DC

## 2018-07-12 MED ORDER — VANCOMYCIN HCL IN DEXTROSE 1-5 GM/200ML-% IV SOLN
1000.0000 mg | Freq: Once | INTRAVENOUS | Status: AC
  Administered 2018-07-12: 1000 mg via INTRAVENOUS
  Filled 2018-07-12: qty 200

## 2018-07-12 NOTE — ED Notes (Signed)
Updated son Sharon [name] on pt's dispo

## 2018-07-12 NOTE — ED Notes (Signed)
Bladder scan 21mL

## 2018-07-12 NOTE — ED Notes (Addendum)
Report given to Olivia Mackie, Therapist, sports at [employer].

## 2018-07-12 NOTE — ED Triage Notes (Signed)
Pt arrived via ems, PT HAS STAGE 2 SACRAL ULCER and pulled PICC out last night and cannot receive IV antibiotics. Pt states she can't urinate. Pt from [employer].  Pt is A&Ox3. Pt has some confusion. Pt's vs wnl.

## 2018-07-12 NOTE — Progress Notes (Signed)
Peripherally Inserted Central Catheter/Midline Placement  The IV Nurse has discussed with the patient and/or persons authorized to consent for the patient, the purpose of this procedure and the potential benefits and risks involved with this procedure.  The benefits include less needle sticks, lab draws from the catheter, and the patient may be discharged home with the catheter. Risks include, but not limited to, infection, bleeding, blood clot (thrombus formation), and puncture of an artery; nerve damage and irregular heartbeat and possibility to perform a PICC exchange if needed/ordered by physician.  Alternatives to this procedure were also discussed.  [facility] PICC patient education guide, fact sheet on infection prevention and patient information card has been provided to patient /or left at bedside.    PICC/Midline Placement Documentation    Consent obtained via telephone with son.    Holley Bouche Renee 07/12/2018, 5:18 PM

## 2018-07-12 NOTE — ED Provider Notes (Signed)
[facility] EMERGENCY DEPARTMENT Provider Note   CSN: [id number] Arrival date & time: 07/12/18  1413    History   Chief Complaint Chief Complaint  Patient presents with  . Urinary Retention    HPI Sharon [name] is a 85 y.o. female.     Patient pulled out her PICC line yesterday.  Has been on IV vancomycin for sacral infection.  Patient has feeling of urinary retention which appears possibly chronic.  Does not have any abdominal pain, patient is here for possible PICC line.  The history is provided by the patient.  Illness Location:  General Severity:  Mild Onset quality:  Gradual Timing:  Constant Associated symptoms: cough   Associated symptoms: no abdominal pain, no chest pain, no ear pain, no fever, no rash, no shortness of breath, no sore throat and no vomiting     Past Medical History:  Diagnosis Date  . Adult failure to thrive   . Anemia   . Atrial fibrillation ([place])   . Cardiac arrest ([place])   . Cataract   . CHF (congestive heart failure) ([place])   . Chronic kidney disease   . Dementia ([place])   . Diabetes mellitus without complication ([place])   . Edema    feet/ankles occas  . Hip fracture ([place])   . Hyperlipidemia   . Hypertension   . Hypothyroid   . Neuropathy   . Osteomyelitis ([place])    left first metatarsal  . Peripheral vascular disease ([place])   . Stroke [facility])     Patient Active Problem List   Diagnosis Date Noted  . Hypotension 05/28/2018  . Lymphedema 03/24/2018  . Pressure injury of skin 03/12/2018  . Atherosclerosis of native arteries of the extremities with ulceration ([place]) 05/07/2017  . Non-healing ulcer ([place]) 04/16/2017  . Diabetic neuropathy ([place]) 04/07/2017  . Septic shock ([place]) 03/20/2017  . CKD (chronic kidney disease), stage III ([place]) 03/19/2017  . Goals of care, counseling/discussion 03/03/2017  . PAD (peripheral artery disease) ([place]) 02/03/2017  . Abdominal aortic stenosis 02/03/2017  . Bilateral lower extremity  edema 02/03/2017  . Anemia 01/29/2017  . B12 deficiency 01/29/2017  . Pancreatic mass 01/29/2017  . Hyponatremia 07/07/2015  . Foot ulcer ([place]) 02/07/2015  . Acute on chronic renal failure ([place]) 11/05/2014  . Diabetic foot infection ([place]) 10/10/2014  . Chronic diastolic heart failure ([place]) 05/31/2014  . HTN (hypertension) 05/31/2014  . DM (diabetes mellitus), type 2, uncontrolled ([place]) 05/31/2014    Past Surgical History:  Procedure Laterality Date  . ACHILLES TENDON SURGERY Left 02/08/2015   Procedure: ACHILLES LENGTHENING/KIDNER;  Surgeon: Albertine Patricia, DPM;  Location: [facility] ORS;  Service: Podiatry;  Laterality: Left;  . APPENDECTOMY    . CARDIAC CATHETERIZATION  08/25/13  . CATARACT EXTRACTION W/PHACO Left 07/20/2014   Procedure: CATARACT EXTRACTION PHACO AND INTRAOCULAR LENS PLACEMENT (IOC);  Surgeon: Leandrew Koyanagi, MD;  Location: [facility] ORS;  Service: Ophthalmology;  Laterality: Left;  [place]  1:18                 AP     23.6             CDE   9.69      lot #[phone]  . CHOLECYSTECTOMY    . CORONARY ANGIOPLASTY    . HALLUX VALGUS AKIN Left 02/08/2015   Procedure: HALLUX VALGUS AKIN/ KELLER;  Surgeon: Albertine Patricia, DPM;  Location: [facility] ORS;  Service: Podiatry;  Laterality: Left;  IVA with Local needs 1 hour  for this case   . HEMIARTHROPLASTY HIP Right   . HEMIARTHROPLASTY HIP Left   . INCISION AND DRAINAGE Left 10/11/2014   Procedure: Removal of infected tibial sessmoid;  Surgeon: Albertine Patricia, DPM;  Location: [facility] ORS;  Service: Podiatry;  Laterality: Left;  . IRRIGATION AND DEBRIDEMENT FOOT Right 03/21/2017   Procedure: IRRIGATION AND DEBRIDEMENT FOOT right great toe amputation;  Surgeon: Sharlotte Alamo, DPM;  Location: [facility] ORS;  Service: Podiatry;  Laterality: Right;  . IRRIGATION AND DEBRIDEMENT FOOT Right 04/18/2017   Procedure: IRRIGATION AND DEBRIDEMENT FOOT and application wound vac;  Surgeon: Albertine Patricia, DPM;  Location: [facility] ORS;  Service: Podiatry;  Laterality:  Right;  . KYPHOPLASTY N/A 06/03/2018   Procedure: KYPHOPLASTY L1;  Surgeon: Hessie Knows, MD;  Location: [facility] ORS;  Service: Orthopedics;  Laterality: N/A;  . PERIPHERAL VASCULAR BALLOON ANGIOPLASTY Right 04/17/2017   Procedure: PERIPHERAL VASCULAR BALLOON ANGIOPLASTY;  Surgeon: Katha Cabal, MD;  Location: [facility];  Service: Cardiovascular;  Laterality: Right;     OB History   No obstetric history on file.      Home Medications    Prior to Admission medications   Medication Sig Start Date End Date Taking? Authorizing Provider  acetaminophen (TYLENOL) 500 MG tablet Take 1,000 mg by mouth at bedtime. For pain management    [provider]  acetaminophen (TYLENOL) 650 MG CR tablet Take 650 mg by mouth every 8 (eight) hours as needed. For discomfort    [provider]  albuterol (PROVENTIL) (2.5 MG/3ML) 0.083% nebulizer solution Take 3 mLs (2.5 mg total) by nebulization every 6 (six) hours as needed for wheezing or shortness of breath. 06/03/18   Gouru, Aruna, MD  Amino Acids-Protein Hydrolys (FEEDING SUPPLEMENT, PRO-STAT SUGAR FREE 64,) LIQD Take 30 mLs by mouth 2 (two) times daily between meals.    [provider]  amiodarone (PACERONE) 200 MG tablet Take 1 tablet (200 mg total) by mouth daily. 03/24/17   Gladstone Lighter, MD  atorvastatin (LIPITOR) 10 MG tablet Take 10 mg by mouth daily at 10 pm.     [provider]  bisacodyl (DULCOLAX) 5 MG EC tablet Take 5 mg by mouth daily. 04/26/18   [provider]  cholecalciferol (VITAMIN D) 1000 units tablet Take 1,000 Units by mouth daily.    [provider]  collagenase (SANTYL) ointment Apply topically daily. 03/12/18   Stark Jock Jude, MD  docusate sodium (COLACE) 100 MG capsule Take 1 capsule (100 mg total) by mouth 2 (two) times daily as needed for mild constipation. 06/03/18   Gouru, Illene Silver, MD  ferrous sulfate 325 (65 FE) MG tablet Take 325 mg by mouth daily.     [provider]  fluconazole (DIFLUCAN) 150 MG tablet Take 1 tablet (150 mg total) by mouth daily. 06/03/18   Gouru, Illene Silver, MD  furosemide (LASIX) 20 MG tablet Take 20 mg by mouth daily.  03/15/18   [provider]  gabapentin (NEURONTIN) 300 MG capsule Take 300 mg by mouth 3 (three) times daily. 04/24/18   [provider]  GLUCERNA (GLUCERNA) LIQD Take 237 mLs by mouth 2 (two) times daily between meals.    [provider]  HYDROcodone-acetaminophen (NORCO/VICODIN) 5-325 MG tablet Take 1 tablet by mouth 3 (three) times daily. 06/03/18   Gouru, Illene Silver, MD  insulin aspart (NOVOLOG) 100 UNIT/ML injection Inject 4-8 Units into the skin 3 (three) times daily. 04/24/18   [provider]  insulin glargine (LANTUS) 100 UNIT/ML injection  Inject 15 Units into the skin at bedtime. 04/24/18 04/24/19  [provider]  JANUVIA 50 MG tablet Take 50 mg by mouth daily. Take along with Amaryl 02/15/18   [provider]  levothyroxine (SYNTHROID, LEVOTHROID) 112 MCG tablet Take 112 mcg by mouth daily before breakfast. 30 minutes before breakfast    [provider]  linaclotide (LINZESS) 145 MCG CAPS capsule Take 145 mcg by mouth daily. 05/11/18   [provider]  metoprolol tartrate (LOPRESSOR) 25 MG tablet Take 1 tablet (25 mg total) by mouth 2 (two) times daily. 04/21/17   Saundra Shelling, MD  Multiple Vitamin (MULTIVITAMIN WITH MINERALS) TABS tablet Take 1 tablet by mouth daily. 06/03/18   Nicholes Mango, MD  mupirocin ointment (BACTROBAN) 2 % Place 1 application into the nose 2 (two) times daily.    [provider]  ondansetron (ZOFRAN) 4 MG tablet Take 1 tablet (4 mg total) by mouth every 6 (six) hours as needed for nausea. 06/03/18   Nicholes Mango, MD  oxyCODONE-acetaminophen (PERCOCET/ROXICET) 5-325 MG tablet Take 1 tablet by mouth every 6 (six) hours as needed for severe pain. 06/03/18   Gouru, Illene Silver, MD  potassium chloride SA (K-DUR) 20 MEQ tablet Take  20 mEq by mouth daily. 05/03/18   [provider]  simethicone (MYLICON) 80 MG chewable tablet Chew 80 mg by mouth every 6 (six) hours. 05/11/18   [provider]  traMADol (ULTRAM) 50 MG tablet Take 1 tablet (50 mg total) by mouth every 6 (six) hours as needed for moderate pain. 06/03/18   Nicholes Mango, MD  traZODone (DESYREL) 50 MG tablet Take 50 mg by mouth at bedtime. For anxiety 06/24/17 06/24/18  [provider]  vitamin C (VITAMIN C) 250 MG tablet Take 1 tablet (250 mg total) by mouth 2 (two) times daily. 06/03/18   Nicholes Mango, MD    Family History Family History  Problem Relation Age of Onset  . Heart attack Mother     Social History Social History   Tobacco Use  . Smoking status: Former Smoker    Packs/day: 0.50    Years: 7.00    Pack years: 3.50    Types: Cigarettes    Quit date: 05/30/1989    Years since quitting: 29.1  . Smokeless tobacco: Never Used  Substance Use Topics  . Alcohol use: No    Alcohol/week: 0.0 standard drinks  . Drug use: No     Allergies   Carbamazepine, Cymbalta [duloxetine hcl], Duloxetine, Lyrica [pregabalin], and Temazepam   Review of Systems Review of Systems  Constitutional: Negative for chills and fever.  HENT: Negative for ear pain and sore throat.   Eyes: Negative for pain and visual disturbance.  Respiratory: Positive for cough. Negative for shortness of breath.   Cardiovascular: Negative for chest pain and palpitations.  Gastrointestinal: Negative for abdominal pain and vomiting.  Genitourinary: Positive for difficulty urinating. Negative for dysuria and hematuria.  Musculoskeletal: Negative for arthralgias and back pain.  Skin: Negative for color change and rash.  Neurological: Negative for seizures and syncope.  All other systems reviewed and are negative.    Physical Exam Updated Vital Signs  ED Triage Vitals  Enc Vitals Group     BP 07/12/18 1430 100/80     Pulse Rate 07/12/18 1430 72      Resp 07/12/18 1436 18     Temp 07/12/18 1436 97.8 F (36.6 C)     Temp Source 07/12/18 1436 Oral  SpO2 07/12/18 1430 100 %     Weight 07/12/18 1437 127 lb (57.6 kg)     Height 07/12/18 1437 5\' 5"  (1.651 m)     Head Circumference --      Peak Flow --      Pain Score 07/12/18 1436 10     Pain Loc --      Pain Edu? --      Excl. in [place]? --     Physical Exam Vitals signs and nursing note reviewed.  Constitutional:      General: She is not in acute distress.    Appearance: She is well-developed.  HENT:     Head: Normocephalic and atraumatic.  Eyes:     Conjunctiva/sclera: Conjunctivae normal.  Neck:     Musculoskeletal: Neck supple.  Cardiovascular:     Rate and Rhythm: Normal rate and regular rhythm.     Pulses: Normal pulses.     Heart sounds: Normal heart sounds. No murmur.  Pulmonary:     Effort: Pulmonary effort is normal. No respiratory distress.     Breath sounds: Normal breath sounds.  Abdominal:     Palpations: Abdomen is soft.     Tenderness: There is no abdominal tenderness.  Skin:    General: Skin is warm and dry.  Neurological:     Mental Status: She is alert.      ED Treatments / Results  Labs (all labs ordered are listed, but only abnormal results are displayed) Labs Reviewed - No data to display  EKG None  Radiology [place] Ekg Site Rite  Result Date: 07/12/2018 If Site Rite image not attached, placement could not be confirmed due to current cardiac rhythm.   Procedures Procedures (including critical care time)  Medications Ordered in ED Medications  vancomycin (VANCOCIN) IVPB 1000 mg/200 mL premix (has no administration in time range)     Initial Impression / Assessment and Plan / ED Course  I have reviewed the triage vital signs and the nursing notes.  Pertinent labs & imaging results that were available during my care of the patient were reviewed by me and considered in my medical decision making (see chart for details).     Sulma Ruffino Remedios is an 85 year old female here for PICC line placement.  Patient with normal vitals.  No fever.  On IV vancomycin for sacral wound.  Patient states that she has had some feeling of urinary retention.  However bladder scan is negative.  Patient does not have any abdominal tenderness.  No nausea, no vomiting.  PICC team is available and will place PICC line.  We will give her dose of IV vancomycin and discharge back to facility.  This chart was dictated using voice recognition software.  Despite best efforts to proofread,  errors can occur which can change the documentation meaning.    Final Clinical Impressions(s) / ED Diagnoses   Final diagnoses:  Pressure ulcer, back, lower    ED Discharge Orders    None       Lennice Sites, DO 07/12/18 1721

## 2018-07-12 NOTE — ED Notes (Signed)
PICC team at bedside

## 2018-07-12 NOTE — ED Notes (Signed)
PICC team states they will come shortly to put PICC in

## 2018-07-13 DIAGNOSIS — L89624 Pressure ulcer of left heel, stage 4: Secondary | ICD-10-CM | POA: Diagnosis not present

## 2018-07-13 DIAGNOSIS — G8929 Other chronic pain: Secondary | ICD-10-CM | POA: Diagnosis not present

## 2018-07-13 DIAGNOSIS — L89614 Pressure ulcer of right heel, stage 4: Secondary | ICD-10-CM | POA: Diagnosis not present

## 2018-07-13 DIAGNOSIS — R627 Adult failure to thrive: Secondary | ICD-10-CM | POA: Diagnosis not present

## 2018-07-14 DIAGNOSIS — D649 Anemia, unspecified: Secondary | ICD-10-CM | POA: Diagnosis not present

## 2018-07-14 DIAGNOSIS — E039 Hypothyroidism, unspecified: Secondary | ICD-10-CM | POA: Diagnosis not present

## 2018-07-14 DIAGNOSIS — E119 Type 2 diabetes mellitus without complications: Secondary | ICD-10-CM | POA: Diagnosis not present

## 2018-07-14 DIAGNOSIS — E1140 Type 2 diabetes mellitus with diabetic neuropathy, unspecified: Secondary | ICD-10-CM | POA: Diagnosis not present

## 2018-07-15 DIAGNOSIS — Z5181 Encounter for therapeutic drug level monitoring: Secondary | ICD-10-CM | POA: Diagnosis not present

## 2018-07-15 DIAGNOSIS — L89624 Pressure ulcer of left heel, stage 4: Secondary | ICD-10-CM | POA: Diagnosis not present

## 2018-07-15 DIAGNOSIS — L89614 Pressure ulcer of right heel, stage 4: Secondary | ICD-10-CM | POA: Diagnosis not present

## 2018-07-15 DIAGNOSIS — N19 Unspecified kidney failure: Secondary | ICD-10-CM | POA: Diagnosis not present

## 2018-07-15 DIAGNOSIS — L89153 Pressure ulcer of sacral region, stage 3: Secondary | ICD-10-CM | POA: Diagnosis not present

## 2018-07-15 DIAGNOSIS — G8929 Other chronic pain: Secondary | ICD-10-CM | POA: Diagnosis not present

## 2018-07-15 DIAGNOSIS — L97319 Non-pressure chronic ulcer of right ankle with unspecified severity: Secondary | ICD-10-CM | POA: Diagnosis not present

## 2018-07-15 DIAGNOSIS — R627 Adult failure to thrive: Secondary | ICD-10-CM | POA: Diagnosis not present

## 2018-07-16 DIAGNOSIS — Z5181 Encounter for therapeutic drug level monitoring: Secondary | ICD-10-CM | POA: Diagnosis not present

## 2018-07-16 DIAGNOSIS — Z139 Encounter for screening, unspecified: Secondary | ICD-10-CM | POA: Diagnosis not present

## 2018-07-19 DIAGNOSIS — Z5181 Encounter for therapeutic drug level monitoring: Secondary | ICD-10-CM | POA: Diagnosis not present

## 2018-07-19 DIAGNOSIS — N189 Chronic kidney disease, unspecified: Secondary | ICD-10-CM | POA: Diagnosis not present

## 2018-07-19 DIAGNOSIS — Z139 Encounter for screening, unspecified: Secondary | ICD-10-CM | POA: Diagnosis not present

## 2018-07-19 DIAGNOSIS — D649 Anemia, unspecified: Secondary | ICD-10-CM | POA: Diagnosis not present

## 2018-07-20 DIAGNOSIS — L89614 Pressure ulcer of right heel, stage 4: Secondary | ICD-10-CM | POA: Diagnosis not present

## 2018-07-20 DIAGNOSIS — R5383 Other fatigue: Secondary | ICD-10-CM | POA: Diagnosis not present

## 2018-07-20 DIAGNOSIS — R627 Adult failure to thrive: Secondary | ICD-10-CM | POA: Diagnosis not present

## 2018-07-20 DIAGNOSIS — G8929 Other chronic pain: Secondary | ICD-10-CM | POA: Diagnosis not present

## 2018-07-22 DIAGNOSIS — L89153 Pressure ulcer of sacral region, stage 3: Secondary | ICD-10-CM | POA: Diagnosis not present

## 2018-07-22 DIAGNOSIS — L97319 Non-pressure chronic ulcer of right ankle with unspecified severity: Secondary | ICD-10-CM | POA: Diagnosis not present

## 2018-07-22 DIAGNOSIS — Z5181 Encounter for therapeutic drug level monitoring: Secondary | ICD-10-CM | POA: Diagnosis not present

## 2018-07-22 DIAGNOSIS — D649 Anemia, unspecified: Secondary | ICD-10-CM | POA: Diagnosis not present

## 2018-07-22 DIAGNOSIS — L89624 Pressure ulcer of left heel, stage 4: Secondary | ICD-10-CM | POA: Diagnosis not present

## 2018-07-22 DIAGNOSIS — G8929 Other chronic pain: Secondary | ICD-10-CM | POA: Diagnosis not present

## 2018-07-22 DIAGNOSIS — L89614 Pressure ulcer of right heel, stage 4: Secondary | ICD-10-CM | POA: Diagnosis not present

## 2018-07-22 DIAGNOSIS — R21 Rash and other nonspecific skin eruption: Secondary | ICD-10-CM | POA: Diagnosis not present

## 2018-07-23 DIAGNOSIS — F064 Anxiety disorder due to known physiological condition: Secondary | ICD-10-CM | POA: Diagnosis not present

## 2018-07-23 DIAGNOSIS — F0391 Unspecified dementia with behavioral disturbance: Secondary | ICD-10-CM | POA: Diagnosis not present

## 2018-07-23 DIAGNOSIS — F5101 Primary insomnia: Secondary | ICD-10-CM | POA: Diagnosis not present

## 2018-07-26 DIAGNOSIS — R109 Unspecified abdominal pain: Secondary | ICD-10-CM | POA: Diagnosis not present

## 2018-07-26 DIAGNOSIS — R1084 Generalized abdominal pain: Secondary | ICD-10-CM | POA: Diagnosis not present

## 2018-07-26 DIAGNOSIS — R627 Adult failure to thrive: Secondary | ICD-10-CM | POA: Diagnosis not present

## 2018-07-26 DIAGNOSIS — K5900 Constipation, unspecified: Secondary | ICD-10-CM | POA: Diagnosis not present

## 2018-07-26 DIAGNOSIS — R110 Nausea: Secondary | ICD-10-CM | POA: Diagnosis not present

## 2018-07-27 DIAGNOSIS — Z5181 Encounter for therapeutic drug level monitoring: Secondary | ICD-10-CM | POA: Diagnosis not present

## 2018-07-27 DIAGNOSIS — G8929 Other chronic pain: Secondary | ICD-10-CM | POA: Diagnosis not present

## 2018-07-27 DIAGNOSIS — F0391 Unspecified dementia with behavioral disturbance: Secondary | ICD-10-CM | POA: Diagnosis not present

## 2018-07-27 DIAGNOSIS — R1084 Generalized abdominal pain: Secondary | ICD-10-CM | POA: Diagnosis not present

## 2018-07-27 DIAGNOSIS — F419 Anxiety disorder, unspecified: Secondary | ICD-10-CM | POA: Diagnosis not present

## 2018-07-28 ENCOUNTER — Other Ambulatory Visit: Payer: Self-pay

## 2018-07-28 ENCOUNTER — Emergency Department (HOSPITAL_COMMUNITY)
Admission: EM | Admit: 2018-07-28 | Discharge: 2018-07-28 | Disposition: A | Discharge status: Home / Self Care | Payer: PPO | Attending: Emergency Medicine | Admitting: Emergency Medicine

## 2018-07-28 DIAGNOSIS — F0391 Unspecified dementia with behavioral disturbance: Secondary | ICD-10-CM | POA: Diagnosis not present

## 2018-07-28 DIAGNOSIS — Z87891 Personal history of nicotine dependence: Secondary | ICD-10-CM | POA: Insufficient documentation

## 2018-07-28 DIAGNOSIS — Z79899 Other long term (current) drug therapy: Secondary | ICD-10-CM | POA: Insufficient documentation

## 2018-07-28 DIAGNOSIS — Z794 Long term (current) use of insulin: Secondary | ICD-10-CM | POA: Insufficient documentation

## 2018-07-28 DIAGNOSIS — K921 Melena: Secondary | ICD-10-CM | POA: Diagnosis present

## 2018-07-28 DIAGNOSIS — M2550 Pain in unspecified joint: Secondary | ICD-10-CM | POA: Diagnosis not present

## 2018-07-28 DIAGNOSIS — F0390 Unspecified dementia without behavioral disturbance: Secondary | ICD-10-CM | POA: Insufficient documentation

## 2018-07-28 DIAGNOSIS — Z8673 Personal history of transient ischemic attack (TIA), and cerebral infarction without residual deficits: Secondary | ICD-10-CM | POA: Diagnosis not present

## 2018-07-28 DIAGNOSIS — E039 Hypothyroidism, unspecified: Secondary | ICD-10-CM | POA: Insufficient documentation

## 2018-07-28 DIAGNOSIS — I252 Old myocardial infarction: Secondary | ICD-10-CM | POA: Insufficient documentation

## 2018-07-28 DIAGNOSIS — Z7401 Bed confinement status: Secondary | ICD-10-CM | POA: Diagnosis not present

## 2018-07-28 DIAGNOSIS — Z20828 Contact with and (suspected) exposure to other viral communicable diseases: Secondary | ICD-10-CM | POA: Diagnosis not present

## 2018-07-28 DIAGNOSIS — F419 Anxiety disorder, unspecified: Secondary | ICD-10-CM | POA: Diagnosis not present

## 2018-07-28 DIAGNOSIS — Z03818 Encounter for observation for suspected exposure to other biological agents ruled out: Secondary | ICD-10-CM | POA: Diagnosis not present

## 2018-07-28 DIAGNOSIS — R0902 Hypoxemia: Secondary | ICD-10-CM | POA: Diagnosis not present

## 2018-07-28 DIAGNOSIS — N183 Chronic kidney disease, stage 3 (moderate): Secondary | ICD-10-CM | POA: Insufficient documentation

## 2018-07-28 DIAGNOSIS — I959 Hypotension, unspecified: Secondary | ICD-10-CM | POA: Diagnosis not present

## 2018-07-28 DIAGNOSIS — E1122 Type 2 diabetes mellitus with diabetic chronic kidney disease: Secondary | ICD-10-CM | POA: Insufficient documentation

## 2018-07-28 DIAGNOSIS — I130 Hypertensive heart and chronic kidney disease with heart failure and stage 1 through stage 4 chronic kidney disease, or unspecified chronic kidney disease: Secondary | ICD-10-CM | POA: Diagnosis not present

## 2018-07-28 DIAGNOSIS — K5900 Constipation, unspecified: Secondary | ICD-10-CM | POA: Diagnosis not present

## 2018-07-28 DIAGNOSIS — I5032 Chronic diastolic (congestive) heart failure: Secondary | ICD-10-CM | POA: Diagnosis not present

## 2018-07-28 DIAGNOSIS — R58 Hemorrhage, not elsewhere classified: Secondary | ICD-10-CM | POA: Diagnosis not present

## 2018-07-28 DIAGNOSIS — R402411 Glasgow coma scale score 13-15, in the field [EMT or ambulance]: Secondary | ICD-10-CM | POA: Diagnosis not present

## 2018-07-28 DIAGNOSIS — K625 Hemorrhage of anus and rectum: Secondary | ICD-10-CM | POA: Diagnosis not present

## 2018-07-28 DIAGNOSIS — R410 Disorientation, unspecified: Secondary | ICD-10-CM | POA: Diagnosis not present

## 2018-07-28 LAB — CBC WITH DIFFERENTIAL/PLATELET
Abs Immature Granulocytes: 0.09 10*3/uL — ABNORMAL HIGH (ref 0.00–0.07)
Basophils Absolute: 0.1 10*3/uL (ref 0.0–0.1)
Basophils Relative: 0 %
Eosinophils Absolute: 0 10*3/uL (ref 0.0–0.5)
Eosinophils Relative: 0 %
HCT: 31.7 % — ABNORMAL LOW (ref 36.0–46.0)
Hemoglobin: 9.6 g/dL — ABNORMAL LOW (ref 12.0–15.0)
Immature Granulocytes: 1 %
Lymphocytes Relative: 7 %
Lymphs Abs: 0.8 10*3/uL (ref 0.7–4.0)
MCH: 27.2 pg (ref 26.0–34.0)
MCHC: 30.3 g/dL (ref 30.0–36.0)
MCV: 89.8 fL (ref 80.0–100.0)
Monocytes Absolute: 0.7 10*3/uL (ref 0.1–1.0)
Monocytes Relative: 6 %
Neutro Abs: 9.9 10*3/uL — ABNORMAL HIGH (ref 1.7–7.7)
Neutrophils Relative %: 86 %
Platelets: 323 10*3/uL (ref 150–400)
RBC: 3.53 MIL/uL — ABNORMAL LOW (ref 3.87–5.11)
RDW: 19.6 % — ABNORMAL HIGH (ref 11.5–15.5)
WBC: 11.5 10*3/uL — ABNORMAL HIGH (ref 4.0–10.5)
nRBC: 0 % (ref 0.0–0.2)

## 2018-07-28 LAB — POC OCCULT BLOOD, ED: Fecal Occult Bld: POSITIVE — AB

## 2018-07-28 NOTE — ED Triage Notes (Signed)
Pt arrived from [facility] with complaints of constipation. Stating she has no had a BM since Monday.  Pt is tearful for triage stating no one will help her.

## 2018-07-28 NOTE — ED Provider Notes (Addendum)
[facility] EMERGENCY DEPARTMENT Provider Note   CSN: [id number] Arrival date & time: 07/28/18  1130    History   Chief Complaint Chief Complaint  Patient presents with  . Rectal Bleeding  . Constipation    HPI Sharon [name] is a 85 y.o. female.     HPI  The patient is an 85 year old female, she has significant failure to thrive, she has a history of atrial fibrillation, chronic kidney disease congestive heart failure, prior hip fracture, peripheral neuropathy and a prior stroke.  She is currently being treated for sacral osteomyelitis, last seen within the last 2 weeks because of a PICC line problem.  She presents today after the staff at the nursing facility where she lives saw that she had some blood in her stool.  It was unclear where this was coming from, she had been treated for constipation with a laxative this morning and this occurred while having a bowel movement.  There was a small amount of blood on the stool, the nurse practitioner at the facility requested that she be sent to the emergency department.  The patient reports to me that she is chronically constipated  Past Medical History:  Diagnosis Date  . Adult failure to thrive   . Anemia   . Atrial fibrillation ([place])   . Cardiac arrest ([place])   . Cataract   . CHF (congestive heart failure) ([place])   . Chronic kidney disease   . Dementia ([place])   . Diabetes mellitus without complication ([place])   . Edema    feet/ankles occas  . Hip fracture ([place])   . Hyperlipidemia   . Hypertension   . Hypothyroid   . Neuropathy   . Osteomyelitis ([place])    left first metatarsal  . Peripheral vascular disease ([place])   . Stroke [facility])     Patient Active Problem List   Diagnosis Date Noted  . Hypotension 05/28/2018  . Lymphedema 03/24/2018  . Pressure injury of skin 03/12/2018  . Atherosclerosis of native arteries of the extremities with ulceration ([place]) 05/07/2017  . Non-healing ulcer ([place]) 04/16/2017  .  Diabetic neuropathy ([place]) 04/07/2017  . Septic shock ([place]) 03/20/2017  . CKD (chronic kidney disease), stage III ([place]) 03/19/2017  . Goals of care, counseling/discussion 03/03/2017  . PAD (peripheral artery disease) ([place]) 02/03/2017  . Abdominal aortic stenosis 02/03/2017  . Bilateral lower extremity edema 02/03/2017  . Anemia 01/29/2017  . B12 deficiency 01/29/2017  . Pancreatic mass 01/29/2017  . Hyponatremia 07/07/2015  . Foot ulcer ([place]) 02/07/2015  . Acute on chronic renal failure ([place]) 11/05/2014  . Diabetic foot infection ([place]) 10/10/2014  . Chronic diastolic heart failure ([employer]) 05/31/2014  . HTN (hypertension) 05/31/2014  . DM (diabetes mellitus), type 2, uncontrolled ([place]) 05/31/2014    Past Surgical History:  Procedure Laterality Date  . ACHILLES TENDON SURGERY Left 02/08/2015   Procedure: ACHILLES LENGTHENING/KIDNER;  Surgeon: Albertine Patricia, DPM;  Location: [facility] ORS;  Service: Podiatry;  Laterality: Left;  . APPENDECTOMY    . CARDIAC CATHETERIZATION  08/25/13  . CATARACT EXTRACTION W/PHACO Left 07/20/2014   Procedure: CATARACT EXTRACTION PHACO AND INTRAOCULAR LENS PLACEMENT (IOC);  Surgeon: Leandrew Koyanagi, MD;  Location: [facility] ORS;  Service: Ophthalmology;  Laterality: Left;  [place]  1:18                 AP     23.6             CDE   9.69  lot #[phone]  . CHOLECYSTECTOMY    . CORONARY ANGIOPLASTY    . HALLUX VALGUS AKIN Left 02/08/2015   Procedure: HALLUX VALGUS AKIN/ KELLER;  Surgeon: Albertine Patricia, DPM;  Location: [facility] ORS;  Service: Podiatry;  Laterality: Left;  IVA with Local needs 1 hour for this case   . HEMIARTHROPLASTY HIP Right   . HEMIARTHROPLASTY HIP Left   . INCISION AND DRAINAGE Left 10/11/2014   Procedure: Removal of infected tibial sessmoid;  Surgeon: Albertine Patricia, DPM;  Location: [facility] ORS;  Service: Podiatry;  Laterality: Left;  . IRRIGATION AND DEBRIDEMENT FOOT Right 03/21/2017   Procedure: IRRIGATION AND DEBRIDEMENT FOOT right great toe  amputation;  Surgeon: Sharlotte Alamo, DPM;  Location: [facility] ORS;  Service: Podiatry;  Laterality: Right;  . IRRIGATION AND DEBRIDEMENT FOOT Right 04/18/2017   Procedure: IRRIGATION AND DEBRIDEMENT FOOT and application wound vac;  Surgeon: Albertine Patricia, DPM;  Location: [facility] ORS;  Service: Podiatry;  Laterality: Right;  . KYPHOPLASTY N/A 06/03/2018   Procedure: KYPHOPLASTY L1;  Surgeon: Hessie Knows, MD;  Location: [facility] ORS;  Service: Orthopedics;  Laterality: N/A;  . PERIPHERAL VASCULAR BALLOON ANGIOPLASTY Right 04/17/2017   Procedure: PERIPHERAL VASCULAR BALLOON ANGIOPLASTY;  Surgeon: Katha Cabal, MD;  Location: [facility];  Service: Cardiovascular;  Laterality: Right;     OB History   No obstetric history on file.      Home Medications    Prior to Admission medications   Medication Sig Start Date End Date Taking? Authorizing Provider  acetaminophen (TYLENOL) 500 MG tablet Take 1,000 mg by mouth at bedtime. For pain management    [provider]  acetaminophen (TYLENOL) 650 MG CR tablet Take 650 mg by mouth every 8 (eight) hours as needed. For discomfort    [provider]  albuterol (PROVENTIL) (2.5 MG/3ML) 0.083% nebulizer solution Take 3 mLs (2.5 mg total) by nebulization every 6 (six) hours as needed for wheezing or shortness of breath. 06/03/18   Gouru, Aruna, MD  Amino Acids-Protein Hydrolys (FEEDING SUPPLEMENT, PRO-STAT SUGAR FREE 64,) LIQD Take 30 mLs by mouth 2 (two) times daily between meals.    [provider]  amiodarone (PACERONE) 200 MG tablet Take 1 tablet (200 mg total) by mouth daily. 03/24/17   Gladstone Lighter, MD  atorvastatin (LIPITOR) 10 MG tablet Take 10 mg by mouth daily at 10 pm.     [provider]  bisacodyl (DULCOLAX) 5 MG EC tablet Take 5 mg by mouth daily. 04/26/18   [provider]  cholecalciferol (VITAMIN D) 1000 units tablet Take 1,000 Units by mouth daily.    [provider]  collagenase  (SANTYL) ointment Apply topically daily. 03/12/18   Stark Jock Jude, MD  docusate sodium (COLACE) 100 MG capsule Take 1 capsule (100 mg total) by mouth 2 (two) times daily as needed for mild constipation. 06/03/18   Gouru, Illene Silver, MD  ferrous sulfate 325 (65 FE) MG tablet Take 325 mg by mouth daily.     [provider]  fluconazole (DIFLUCAN) 150 MG tablet Take 1 tablet (150 mg total) by mouth daily. 06/03/18   Gouru, Illene Silver, MD  furosemide (LASIX) 20 MG tablet Take 20 mg by mouth daily.  03/15/18   [provider]  gabapentin (NEURONTIN) 300 MG capsule Take 300 mg by mouth 3 (three) times daily. 04/24/18   [provider]  GLUCERNA (GLUCERNA) LIQD Take 237 mLs by mouth 2 (two) times daily between meals.    [provider]  HYDROcodone-acetaminophen (  NORCO/VICODIN) 5-325 MG tablet Take 1 tablet by mouth 3 (three) times daily. 06/03/18   Gouru, Illene Silver, MD  insulin aspart (NOVOLOG) 100 UNIT/ML injection Inject 4-8 Units into the skin 3 (three) times daily. 04/24/18   [provider]  insulin glargine (LANTUS) 100 UNIT/ML injection Inject 15 Units into the skin at bedtime. 04/24/18 04/24/19  [provider]  JANUVIA 50 MG tablet Take 50 mg by mouth daily. Take along with Amaryl 02/15/18   [provider]  levothyroxine (SYNTHROID, LEVOTHROID) 112 MCG tablet Take 112 mcg by mouth daily before breakfast. 30 minutes before breakfast    [provider]  linaclotide (LINZESS) 145 MCG CAPS capsule Take 145 mcg by mouth daily. 05/11/18   [provider]  metoprolol tartrate (LOPRESSOR) 25 MG tablet Take 1 tablet (25 mg total) by mouth 2 (two) times daily. 04/21/17   Saundra Shelling, MD  Multiple Vitamin (MULTIVITAMIN WITH MINERALS) TABS tablet Take 1 tablet by mouth daily. 06/03/18   Nicholes Mango, MD  mupirocin ointment (BACTROBAN) 2 % Place 1 application into the nose 2 (two) times daily.    [provider]  ondansetron (ZOFRAN) 4 MG tablet Take  1 tablet (4 mg total) by mouth every 6 (six) hours as needed for nausea. 06/03/18   Nicholes Mango, MD  oxyCODONE-acetaminophen (PERCOCET/ROXICET) 5-325 MG tablet Take 1 tablet by mouth every 6 (six) hours as needed for severe pain. 06/03/18   Gouru, Illene Silver, MD  potassium chloride SA (K-DUR) 20 MEQ tablet Take 20 mEq by mouth daily. 05/03/18   [provider]  simethicone (MYLICON) 80 MG chewable tablet Chew 80 mg by mouth every 6 (six) hours. 05/11/18   [provider]  traMADol (ULTRAM) 50 MG tablet Take 1 tablet (50 mg total) by mouth every 6 (six) hours as needed for moderate pain. 06/03/18   Nicholes Mango, MD  traZODone (DESYREL) 50 MG tablet Take 50 mg by mouth at bedtime. For anxiety 06/24/17 06/24/18  [provider]  vitamin C (VITAMIN C) 250 MG tablet Take 1 tablet (250 mg total) by mouth 2 (two) times daily. 06/03/18   Nicholes Mango, MD    Family History Family History  Problem Relation Age of Onset  . Heart attack Mother     Social History Social History   Tobacco Use  . Smoking status: Former Smoker    Packs/day: 0.50    Years: 7.00    Pack years: 3.50    Types: Cigarettes    Quit date: 05/30/1989    Years since quitting: 29.1  . Smokeless tobacco: Never Used  Substance Use Topics  . Alcohol use: No    Alcohol/week: 0.0 standard drinks  . Drug use: No     Allergies   Carbamazepine, Cymbalta [duloxetine hcl], Duloxetine, Lyrica [pregabalin], and Temazepam   Review of Systems Review of Systems  All other systems reviewed and are negative.    Physical Exam Updated Vital Signs BP (!) 131/41 (BP Location: Right Arm)   Pulse 71   Temp 98.3 F (36.8 C) (Oral)   Resp 17   Ht 1.702 m (5\' 7" )   Wt 54.4 kg   SpO2 95%   BMI 18.79 kg/m   Physical Exam Vitals signs and nursing note reviewed.  Constitutional:      General: She is not in acute distress.    Appearance: She is well-developed.  HENT:     Head: Normocephalic and atraumatic.      Mouth/Throat:  Pharynx: No oropharyngeal exudate.  Eyes:     General: No scleral icterus.       Right eye: No discharge.        Left eye: No discharge.     Conjunctiva/sclera: Conjunctivae normal.     Pupils: Pupils are equal, round, and reactive to light.  Neck:     Musculoskeletal: Normal range of motion and neck supple.     Thyroid: No thyromegaly.     Vascular: No JVD.  Cardiovascular:     Rate and Rhythm: Normal rate and regular rhythm.     Heart sounds: Normal heart sounds. No murmur. No friction rub. No gallop.   Pulmonary:     Effort: Pulmonary effort is normal. No respiratory distress.     Breath sounds: Normal breath sounds. No wheezing or rales.  Abdominal:     General: Bowel sounds are normal. There is no distension.     Palpations: Abdomen is soft. There is no mass.     Tenderness: There is no abdominal tenderness.     Comments: The abdomen is diffusely soft, there is no guarding or masses  Genitourinary:    Comments: Rectal exam performed with chaperone present, there are external hemorrhoids, nonthrombosed and nontender.  No active bleeding, green stool in the rectal vault, no gross blood Musculoskeletal: Normal range of motion.        General: No tenderness.  Lymphadenopathy:     Cervical: No cervical adenopathy.  Skin:    General: Skin is warm and dry.     Findings: Rash present. No erythema.     Comments: Multiple pressure sores all in different stages of healing, appear well cared for and dressed appropriately, no areas of open wounds or purulence  Neurological:     Mental Status: She is alert.     Coordination: Coordination normal.  Psychiatric:        Behavior: Behavior normal.      ED Treatments / Results  Labs (all labs ordered are listed, but only abnormal results are displayed) Labs Reviewed  CBC WITH DIFFERENTIAL/PLATELET - Abnormal; Notable for the following components:      Result Value   WBC 11.5 (*)    RBC 3.53 (*)    Hemoglobin 9.6  (*)    HCT 31.7 (*)    RDW 19.6 (*)    Neutro Abs 9.9 (*)    Abs Immature Granulocytes 0.09 (*)    All other components within normal limits  POC OCCULT BLOOD, ED - Abnormal; Notable for the following components:   Fecal Occult Bld POSITIVE (*)    All other components within normal limits    EKG None  Radiology No results found.  Procedures Procedures (including critical care time)  Medications Ordered in ED Medications - No data to display   Initial Impression / Assessment and Plan / ED Course  I have reviewed the triage vital signs and the nursing notes.  Pertinent labs & imaging results that were available during my care of the patient were reviewed by me and considered in my medical decision making (see chart for details).        Reassuring exam that the episode of blood today was unlikely related to a gastrointestinal bleed of concern.  The patient is not on anticoagulants.  Vitals:   07/28/18 1149 07/28/18 1150 07/28/18 1153 07/28/18 1156  BP:  (!) 131/41  (!) 131/41  Pulse:  74  71  Resp:  17  17  Temp:  98.3 F (36.8 C)  98.3 F (36.8 C)  TempSrc:  Oral  Oral  SpO2: 96% 98%  95%  Weight:   54.4 kg   Height:   1.702 m (5\' 7" )     Patient's vital signs have been reviewed, thankfully she has no hypotension or tachycardia.  Her hemoglobin is 9.6 and she has a positive Hemoccult.  When I review her labs from prior visits, her hemoglobin has dropped from 12 in early April down to 9.6, a change of approximately 2.5.  Given that she does not have bright red blood and no abdominal tenderness to speak of I think she can follow-up as an outpatient.  I discussed this with the on-call provider for the gastroenterology service who agrees and has recommended that the nursing facility call the office to make this appointment within the next 2 to 3 weeks.  They have given me the phone number for follow-up.  The patient is stable for discharge and will need to maintain stool  softeners or laxatives to keep soft stool in case this is from a distal hemorrhoidal bleed though I do not see that visually or clinically at this time.  Discussed with gastroenterology, the patient appears to be stable for discharge  Further review of the medical record shows that the patient has seen a gastroenterologist in [place], Dr. Vira Agar.  This would be an appropriate disposition as outpatient.  Local gastroenterology service in agreement after discussion with Nancy Marus.  Care discussed with son who has been updated.  Final Clinical Impressions(s) / ED Diagnoses   Final diagnoses:  Rectal bleeding     Noemi Chapel, MD 07/28/18 1259    Noemi Chapel, MD 07/28/18 1304

## 2018-07-28 NOTE — Discharge Instructions (Addendum)
You do have a small amount of blood in your stool, this may be coming from hemorrhoids so I do suggest that you take a stool softener or laxative every day to make sure that your stools stay soft.  If you develop increasing pain, vomiting or have any visible blood that is worsening in your stool you must return to the emergency department.  Please avoid any aspirin or anti-inflammatory-containing product such as ibuprofen, Advil, Aleve or aspirin.   His follow-up with Dr. Vira Agar, the gastroenterologist, who you have seen in the past

## 2018-07-28 NOTE — ED Notes (Signed)
Called ptar for pt transport  

## 2018-07-28 NOTE — ED Notes (Signed)
Spoke with son and provided update

## 2018-07-29 DIAGNOSIS — F419 Anxiety disorder, unspecified: Secondary | ICD-10-CM | POA: Diagnosis not present

## 2018-07-29 DIAGNOSIS — K5900 Constipation, unspecified: Secondary | ICD-10-CM | POA: Diagnosis not present

## 2018-07-29 DIAGNOSIS — F0391 Unspecified dementia with behavioral disturbance: Secondary | ICD-10-CM | POA: Diagnosis not present

## 2018-07-29 DIAGNOSIS — L89624 Pressure ulcer of left heel, stage 4: Secondary | ICD-10-CM | POA: Diagnosis not present

## 2018-07-29 DIAGNOSIS — L89614 Pressure ulcer of right heel, stage 4: Secondary | ICD-10-CM | POA: Diagnosis not present

## 2018-07-29 DIAGNOSIS — L89153 Pressure ulcer of sacral region, stage 3: Secondary | ICD-10-CM | POA: Diagnosis not present

## 2018-07-29 DIAGNOSIS — R627 Adult failure to thrive: Secondary | ICD-10-CM | POA: Diagnosis not present

## 2018-07-29 LAB — NOVEL CORONAVIRUS, NAA (HOSP ORDER, SEND-OUT TO REF LAB; TAT 18-24 HRS): SARS-CoV-2, NAA: NOT DETECTED

## 2018-07-30 DIAGNOSIS — R627 Adult failure to thrive: Secondary | ICD-10-CM | POA: Diagnosis not present

## 2018-07-30 DIAGNOSIS — G8929 Other chronic pain: Secondary | ICD-10-CM | POA: Diagnosis not present

## 2018-07-30 DIAGNOSIS — K648 Other hemorrhoids: Secondary | ICD-10-CM | POA: Diagnosis not present

## 2018-07-30 DIAGNOSIS — F0391 Unspecified dementia with behavioral disturbance: Secondary | ICD-10-CM | POA: Diagnosis not present

## 2018-08-03 DIAGNOSIS — K5900 Constipation, unspecified: Secondary | ICD-10-CM | POA: Diagnosis not present

## 2018-08-03 DIAGNOSIS — K648 Other hemorrhoids: Secondary | ICD-10-CM | POA: Diagnosis not present

## 2018-08-03 DIAGNOSIS — F0391 Unspecified dementia with behavioral disturbance: Secondary | ICD-10-CM | POA: Diagnosis not present

## 2018-08-03 DIAGNOSIS — R627 Adult failure to thrive: Secondary | ICD-10-CM | POA: Diagnosis not present

## 2018-08-04 DIAGNOSIS — Z139 Encounter for screening, unspecified: Secondary | ICD-10-CM | POA: Diagnosis not present

## 2018-08-05 DIAGNOSIS — L89614 Pressure ulcer of right heel, stage 4: Secondary | ICD-10-CM | POA: Diagnosis not present

## 2018-08-05 DIAGNOSIS — L89153 Pressure ulcer of sacral region, stage 3: Secondary | ICD-10-CM | POA: Diagnosis not present

## 2018-08-05 DIAGNOSIS — L89624 Pressure ulcer of left heel, stage 4: Secondary | ICD-10-CM | POA: Diagnosis not present

## 2018-08-06 DIAGNOSIS — G8929 Other chronic pain: Secondary | ICD-10-CM | POA: Diagnosis not present

## 2018-08-06 DIAGNOSIS — F0391 Unspecified dementia with behavioral disturbance: Secondary | ICD-10-CM | POA: Diagnosis not present

## 2018-08-06 DIAGNOSIS — L22 Diaper dermatitis: Secondary | ICD-10-CM | POA: Diagnosis not present

## 2018-08-06 DIAGNOSIS — F419 Anxiety disorder, unspecified: Secondary | ICD-10-CM | POA: Diagnosis not present

## 2018-08-09 DIAGNOSIS — Z5181 Encounter for therapeutic drug level monitoring: Secondary | ICD-10-CM | POA: Diagnosis not present

## 2018-08-09 DIAGNOSIS — Z139 Encounter for screening, unspecified: Secondary | ICD-10-CM | POA: Diagnosis not present

## 2018-08-09 DIAGNOSIS — I739 Peripheral vascular disease, unspecified: Secondary | ICD-10-CM | POA: Diagnosis not present

## 2018-08-11 DIAGNOSIS — E1140 Type 2 diabetes mellitus with diabetic neuropathy, unspecified: Secondary | ICD-10-CM | POA: Diagnosis not present

## 2018-08-11 DIAGNOSIS — I739 Peripheral vascular disease, unspecified: Secondary | ICD-10-CM | POA: Diagnosis not present

## 2018-08-11 DIAGNOSIS — I2510 Atherosclerotic heart disease of native coronary artery without angina pectoris: Secondary | ICD-10-CM | POA: Diagnosis not present

## 2018-08-11 DIAGNOSIS — I5032 Chronic diastolic (congestive) heart failure: Secondary | ICD-10-CM | POA: Diagnosis not present

## 2018-08-12 DIAGNOSIS — L89624 Pressure ulcer of left heel, stage 4: Secondary | ICD-10-CM | POA: Diagnosis not present

## 2018-08-12 DIAGNOSIS — L89153 Pressure ulcer of sacral region, stage 3: Secondary | ICD-10-CM | POA: Diagnosis not present

## 2018-08-12 DIAGNOSIS — E1140 Type 2 diabetes mellitus with diabetic neuropathy, unspecified: Secondary | ICD-10-CM | POA: Diagnosis not present

## 2018-08-12 DIAGNOSIS — I4891 Unspecified atrial fibrillation: Secondary | ICD-10-CM | POA: Diagnosis not present

## 2018-08-12 DIAGNOSIS — L89614 Pressure ulcer of right heel, stage 4: Secondary | ICD-10-CM | POA: Diagnosis not present

## 2018-08-12 DIAGNOSIS — I5032 Chronic diastolic (congestive) heart failure: Secondary | ICD-10-CM | POA: Diagnosis not present

## 2018-08-16 DIAGNOSIS — Z5181 Encounter for therapeutic drug level monitoring: Secondary | ICD-10-CM | POA: Diagnosis not present

## 2018-08-19 DIAGNOSIS — L89624 Pressure ulcer of left heel, stage 4: Secondary | ICD-10-CM | POA: Diagnosis not present

## 2018-08-19 DIAGNOSIS — L97829 Non-pressure chronic ulcer of other part of left lower leg with unspecified severity: Secondary | ICD-10-CM | POA: Diagnosis not present

## 2018-08-19 DIAGNOSIS — L89614 Pressure ulcer of right heel, stage 4: Secondary | ICD-10-CM | POA: Diagnosis not present

## 2018-08-20 DIAGNOSIS — R627 Adult failure to thrive: Secondary | ICD-10-CM | POA: Diagnosis not present

## 2018-08-20 DIAGNOSIS — F05 Delirium due to known physiological condition: Secondary | ICD-10-CM | POA: Diagnosis not present

## 2018-08-20 DIAGNOSIS — F064 Anxiety disorder due to known physiological condition: Secondary | ICD-10-CM | POA: Diagnosis not present

## 2018-08-20 DIAGNOSIS — F0391 Unspecified dementia with behavioral disturbance: Secondary | ICD-10-CM | POA: Diagnosis not present

## 2018-08-20 DIAGNOSIS — F5101 Primary insomnia: Secondary | ICD-10-CM | POA: Diagnosis not present

## 2018-08-20 DIAGNOSIS — G8929 Other chronic pain: Secondary | ICD-10-CM | POA: Diagnosis not present

## 2018-08-23 DIAGNOSIS — D649 Anemia, unspecified: Secondary | ICD-10-CM | POA: Diagnosis not present

## 2018-08-23 DIAGNOSIS — L22 Diaper dermatitis: Secondary | ICD-10-CM | POA: Diagnosis not present

## 2018-08-23 DIAGNOSIS — B372 Candidiasis of skin and nail: Secondary | ICD-10-CM | POA: Diagnosis not present

## 2018-08-23 DIAGNOSIS — G8929 Other chronic pain: Secondary | ICD-10-CM | POA: Diagnosis not present

## 2018-08-23 DIAGNOSIS — F0391 Unspecified dementia with behavioral disturbance: Secondary | ICD-10-CM | POA: Diagnosis not present

## 2018-08-23 DIAGNOSIS — E785 Hyperlipidemia, unspecified: Secondary | ICD-10-CM | POA: Diagnosis not present

## 2018-08-23 DIAGNOSIS — N189 Chronic kidney disease, unspecified: Secondary | ICD-10-CM | POA: Diagnosis not present

## 2018-08-23 DIAGNOSIS — I10 Essential (primary) hypertension: Secondary | ICD-10-CM | POA: Diagnosis not present

## 2018-08-24 DIAGNOSIS — R627 Adult failure to thrive: Secondary | ICD-10-CM | POA: Diagnosis not present

## 2018-08-24 DIAGNOSIS — L89614 Pressure ulcer of right heel, stage 4: Secondary | ICD-10-CM | POA: Diagnosis not present

## 2018-08-24 DIAGNOSIS — L89624 Pressure ulcer of left heel, stage 4: Secondary | ICD-10-CM | POA: Diagnosis not present

## 2018-08-24 DIAGNOSIS — G8929 Other chronic pain: Secondary | ICD-10-CM | POA: Diagnosis not present

## 2018-08-26 DIAGNOSIS — L89624 Pressure ulcer of left heel, stage 4: Secondary | ICD-10-CM | POA: Diagnosis not present

## 2018-08-26 DIAGNOSIS — L97129 Non-pressure chronic ulcer of left thigh with unspecified severity: Secondary | ICD-10-CM | POA: Diagnosis not present

## 2018-08-27 DIAGNOSIS — F332 Major depressive disorder, recurrent severe without psychotic features: Secondary | ICD-10-CM | POA: Diagnosis not present

## 2018-08-27 DIAGNOSIS — F0391 Unspecified dementia with behavioral disturbance: Secondary | ICD-10-CM | POA: Diagnosis not present

## 2018-08-27 DIAGNOSIS — F419 Anxiety disorder, unspecified: Secondary | ICD-10-CM | POA: Diagnosis not present

## 2018-08-27 DIAGNOSIS — R627 Adult failure to thrive: Secondary | ICD-10-CM | POA: Diagnosis not present

## 2018-08-31 DIAGNOSIS — G8929 Other chronic pain: Secondary | ICD-10-CM | POA: Diagnosis not present

## 2018-08-31 DIAGNOSIS — B372 Candidiasis of skin and nail: Secondary | ICD-10-CM | POA: Diagnosis not present

## 2018-08-31 DIAGNOSIS — R52 Pain, unspecified: Secondary | ICD-10-CM | POA: Diagnosis not present

## 2018-08-31 DIAGNOSIS — Z79899 Other long term (current) drug therapy: Secondary | ICD-10-CM | POA: Diagnosis not present

## 2018-08-31 DIAGNOSIS — Z5181 Encounter for therapeutic drug level monitoring: Secondary | ICD-10-CM | POA: Diagnosis not present

## 2018-08-31 DIAGNOSIS — L22 Diaper dermatitis: Secondary | ICD-10-CM | POA: Diagnosis not present

## 2018-09-02 DIAGNOSIS — L89614 Pressure ulcer of right heel, stage 4: Secondary | ICD-10-CM | POA: Diagnosis not present

## 2018-09-02 DIAGNOSIS — L89624 Pressure ulcer of left heel, stage 4: Secondary | ICD-10-CM | POA: Diagnosis not present

## 2018-09-09 DIAGNOSIS — L89624 Pressure ulcer of left heel, stage 4: Secondary | ICD-10-CM | POA: Diagnosis not present

## 2018-09-09 DIAGNOSIS — L89614 Pressure ulcer of right heel, stage 4: Secondary | ICD-10-CM | POA: Diagnosis not present

## 2018-09-13 DIAGNOSIS — L22 Diaper dermatitis: Secondary | ICD-10-CM | POA: Diagnosis not present

## 2018-09-13 DIAGNOSIS — F0391 Unspecified dementia with behavioral disturbance: Secondary | ICD-10-CM | POA: Diagnosis not present

## 2018-09-13 DIAGNOSIS — B372 Candidiasis of skin and nail: Secondary | ICD-10-CM | POA: Diagnosis not present

## 2018-09-13 DIAGNOSIS — R52 Pain, unspecified: Secondary | ICD-10-CM | POA: Diagnosis not present

## 2018-09-22 DIAGNOSIS — F419 Anxiety disorder, unspecified: Secondary | ICD-10-CM | POA: Diagnosis not present

## 2018-09-22 DIAGNOSIS — R627 Adult failure to thrive: Secondary | ICD-10-CM | POA: Diagnosis not present

## 2018-09-22 DIAGNOSIS — F0391 Unspecified dementia with behavioral disturbance: Secondary | ICD-10-CM | POA: Diagnosis not present

## 2018-09-22 DIAGNOSIS — G8929 Other chronic pain: Secondary | ICD-10-CM | POA: Diagnosis not present

## 2018-09-24 DIAGNOSIS — L89624 Pressure ulcer of left heel, stage 4: Secondary | ICD-10-CM | POA: Diagnosis not present

## 2018-09-24 DIAGNOSIS — L89614 Pressure ulcer of right heel, stage 4: Secondary | ICD-10-CM | POA: Diagnosis not present

## 2018-09-28 DIAGNOSIS — I5032 Chronic diastolic (congestive) heart failure: Secondary | ICD-10-CM | POA: Diagnosis not present

## 2018-09-28 DIAGNOSIS — D649 Anemia, unspecified: Secondary | ICD-10-CM | POA: Diagnosis not present

## 2018-09-28 DIAGNOSIS — R627 Adult failure to thrive: Secondary | ICD-10-CM | POA: Diagnosis not present

## 2018-09-28 DIAGNOSIS — F419 Anxiety disorder, unspecified: Secondary | ICD-10-CM | POA: Diagnosis not present

## 2018-09-29 DIAGNOSIS — E038 Other specified hypothyroidism: Secondary | ICD-10-CM | POA: Diagnosis not present

## 2018-09-29 DIAGNOSIS — E119 Type 2 diabetes mellitus without complications: Secondary | ICD-10-CM | POA: Diagnosis not present

## 2018-09-29 DIAGNOSIS — I10 Essential (primary) hypertension: Secondary | ICD-10-CM | POA: Diagnosis not present

## 2018-09-29 DIAGNOSIS — E785 Hyperlipidemia, unspecified: Secondary | ICD-10-CM | POA: Diagnosis not present

## 2018-09-30 DIAGNOSIS — L89614 Pressure ulcer of right heel, stage 4: Secondary | ICD-10-CM | POA: Diagnosis not present

## 2018-09-30 DIAGNOSIS — L89624 Pressure ulcer of left heel, stage 4: Secondary | ICD-10-CM | POA: Diagnosis not present

## 2018-10-01 DIAGNOSIS — G8929 Other chronic pain: Secondary | ICD-10-CM | POA: Diagnosis not present

## 2018-10-01 DIAGNOSIS — K5900 Constipation, unspecified: Secondary | ICD-10-CM | POA: Diagnosis not present

## 2018-10-05 DIAGNOSIS — M6281 Muscle weakness (generalized): Secondary | ICD-10-CM | POA: Diagnosis not present

## 2018-10-05 DIAGNOSIS — R5383 Other fatigue: Secondary | ICD-10-CM | POA: Diagnosis not present

## 2018-10-05 DIAGNOSIS — R451 Restlessness and agitation: Secondary | ICD-10-CM | POA: Diagnosis not present

## 2018-10-05 DIAGNOSIS — F419 Anxiety disorder, unspecified: Secondary | ICD-10-CM | POA: Diagnosis not present

## 2018-10-05 DIAGNOSIS — F0391 Unspecified dementia with behavioral disturbance: Secondary | ICD-10-CM | POA: Diagnosis not present

## 2018-10-05 DIAGNOSIS — I5032 Chronic diastolic (congestive) heart failure: Secondary | ICD-10-CM | POA: Diagnosis not present

## 2018-10-07 DIAGNOSIS — F064 Anxiety disorder due to known physiological condition: Secondary | ICD-10-CM | POA: Diagnosis not present

## 2018-10-07 DIAGNOSIS — L89624 Pressure ulcer of left heel, stage 4: Secondary | ICD-10-CM | POA: Diagnosis not present

## 2018-10-07 DIAGNOSIS — L89614 Pressure ulcer of right heel, stage 4: Secondary | ICD-10-CM | POA: Diagnosis not present

## 2018-10-07 DIAGNOSIS — F5101 Primary insomnia: Secondary | ICD-10-CM | POA: Diagnosis not present

## 2018-10-07 DIAGNOSIS — F0150 Vascular dementia without behavioral disturbance: Secondary | ICD-10-CM | POA: Diagnosis not present

## 2018-10-09 ENCOUNTER — Emergency Department (HOSPITAL_COMMUNITY): Payer: PPO

## 2018-10-09 ENCOUNTER — Emergency Department (HOSPITAL_COMMUNITY)
Admission: EM | Admit: 2018-10-09 | Discharge: 2018-10-09 | Disposition: A | Discharge status: Home / Self Care | Payer: PPO | Attending: Emergency Medicine | Admitting: Emergency Medicine

## 2018-10-09 ENCOUNTER — Encounter (HOSPITAL_COMMUNITY): Payer: Self-pay | Admitting: Obstetrics and Gynecology

## 2018-10-09 ENCOUNTER — Other Ambulatory Visit: Payer: Self-pay

## 2018-10-09 DIAGNOSIS — I130 Hypertensive heart and chronic kidney disease with heart failure and stage 1 through stage 4 chronic kidney disease, or unspecified chronic kidney disease: Secondary | ICD-10-CM | POA: Insufficient documentation

## 2018-10-09 DIAGNOSIS — Z96641 Presence of right artificial hip joint: Secondary | ICD-10-CM | POA: Diagnosis not present

## 2018-10-09 DIAGNOSIS — Z794 Long term (current) use of insulin: Secondary | ICD-10-CM | POA: Diagnosis not present

## 2018-10-09 DIAGNOSIS — E1140 Type 2 diabetes mellitus with diabetic neuropathy, unspecified: Secondary | ICD-10-CM | POA: Insufficient documentation

## 2018-10-09 DIAGNOSIS — Z87891 Personal history of nicotine dependence: Secondary | ICD-10-CM | POA: Insufficient documentation

## 2018-10-09 DIAGNOSIS — R109 Unspecified abdominal pain: Secondary | ICD-10-CM | POA: Diagnosis not present

## 2018-10-09 DIAGNOSIS — I5032 Chronic diastolic (congestive) heart failure: Secondary | ICD-10-CM | POA: Insufficient documentation

## 2018-10-09 DIAGNOSIS — R5381 Other malaise: Secondary | ICD-10-CM | POA: Diagnosis not present

## 2018-10-09 DIAGNOSIS — E039 Hypothyroidism, unspecified: Secondary | ICD-10-CM | POA: Insufficient documentation

## 2018-10-09 DIAGNOSIS — M2550 Pain in unspecified joint: Secondary | ICD-10-CM | POA: Diagnosis not present

## 2018-10-09 DIAGNOSIS — E1122 Type 2 diabetes mellitus with diabetic chronic kidney disease: Secondary | ICD-10-CM | POA: Diagnosis not present

## 2018-10-09 DIAGNOSIS — Z79899 Other long term (current) drug therapy: Secondary | ICD-10-CM | POA: Diagnosis not present

## 2018-10-09 DIAGNOSIS — K5900 Constipation, unspecified: Secondary | ICD-10-CM | POA: Insufficient documentation

## 2018-10-09 DIAGNOSIS — Z96642 Presence of left artificial hip joint: Secondary | ICD-10-CM | POA: Insufficient documentation

## 2018-10-09 DIAGNOSIS — N183 Chronic kidney disease, stage 3 (moderate): Secondary | ICD-10-CM | POA: Insufficient documentation

## 2018-10-09 DIAGNOSIS — R1084 Generalized abdominal pain: Secondary | ICD-10-CM | POA: Diagnosis not present

## 2018-10-09 DIAGNOSIS — Z8673 Personal history of transient ischemic attack (TIA), and cerebral infarction without residual deficits: Secondary | ICD-10-CM | POA: Diagnosis not present

## 2018-10-09 DIAGNOSIS — R52 Pain, unspecified: Secondary | ICD-10-CM | POA: Diagnosis not present

## 2018-10-09 DIAGNOSIS — Z7401 Bed confinement status: Secondary | ICD-10-CM | POA: Diagnosis not present

## 2018-10-09 LAB — COMPREHENSIVE METABOLIC PANEL
ALT: 55 U/L — ABNORMAL HIGH (ref 0–44)
AST: 67 U/L — ABNORMAL HIGH (ref 15–41)
Albumin: 3.4 g/dL — ABNORMAL LOW (ref 3.5–5.0)
Alkaline Phosphatase: 213 U/L — ABNORMAL HIGH (ref 38–126)
Anion gap: 10 (ref 5–15)
BUN: 23 mg/dL (ref 8–23)
CO2: 19 mmol/L — ABNORMAL LOW (ref 22–32)
Calcium: 8.9 mg/dL (ref 8.9–10.3)
Chloride: 106 mmol/L (ref 98–111)
Creatinine, Ser: 1.2 mg/dL — ABNORMAL HIGH (ref 0.44–1.00)
GFR calc Af Amer: 48 mL/min — ABNORMAL LOW (ref >60)
GFR calc non Af Amer: 42 mL/min — ABNORMAL LOW (ref >60)
Glucose, Bld: 154 mg/dL — ABNORMAL HIGH (ref 70–99)
Potassium: 4.4 mmol/L (ref 3.5–5.1)
Sodium: 135 mmol/L (ref 135–145)
Total Bilirubin: 0.4 mg/dL (ref 0.3–1.2)
Total Protein: 6.8 g/dL (ref 6.5–8.1)

## 2018-10-09 LAB — CBC WITH DIFFERENTIAL/PLATELET
Abs Immature Granulocytes: 0.01 10*3/uL (ref 0.00–0.07)
Basophils Absolute: 0 10*3/uL (ref 0.0–0.1)
Basophils Relative: 1 %
Eosinophils Absolute: 0.2 10*3/uL (ref 0.0–0.5)
Eosinophils Relative: 2 %
HCT: 44.2 % (ref 36.0–46.0)
Hemoglobin: 13.5 g/dL (ref 12.0–15.0)
Immature Granulocytes: 0 %
Lymphocytes Relative: 27 %
Lymphs Abs: 1.8 10*3/uL (ref 0.7–4.0)
MCH: 28.8 pg (ref 26.0–34.0)
MCHC: 30.5 g/dL (ref 30.0–36.0)
MCV: 94.4 fL (ref 80.0–100.0)
Monocytes Absolute: 0.3 10*3/uL (ref 0.1–1.0)
Monocytes Relative: 5 %
Neutro Abs: 4.4 10*3/uL (ref 1.7–7.7)
Neutrophils Relative %: 65 %
Platelets: 254 10*3/uL (ref 150–400)
RBC: 4.68 MIL/uL (ref 3.87–5.11)
RDW: 13.9 % (ref 11.5–15.5)
WBC: 6.7 10*3/uL (ref 4.0–10.5)
nRBC: 0 % (ref 0.0–0.2)

## 2018-10-09 LAB — LIPASE, BLOOD: Lipase: 22 U/L (ref 11–51)

## 2018-10-09 MED ORDER — LIDOCAINE HCL URETHRAL/MUCOSAL 2 % EX GEL
1.0000 "application " | Freq: Once | CUTANEOUS | Status: AC
  Administered 2018-10-09: 1 via TOPICAL
  Filled 2018-10-09: qty 10

## 2018-10-09 MED ORDER — POLYETHYLENE GLYCOL 3350 17 G PO PACK: 17.0000 g | PACK | Freq: Two times a day (BID) | ORAL | 0 refills | Status: AC

## 2018-10-09 MED ORDER — OXYCODONE-ACETAMINOPHEN 5-325 MG PO TABS
1.0000 | ORAL_TABLET | Freq: Once | ORAL | Status: AC
  Administered 2018-10-09: 1 via ORAL
  Filled 2018-10-09: qty 1

## 2018-10-09 NOTE — ED Notes (Signed)
RN spoke to facility RN Tammy: Pt was given prune juice and miralax prior to this visit.Manual disimpaction was attempted but patient started to bleed rectally and the NP wanted patient sent out for evaluation    Patient has reportedly been constipated for a while due to narcotic use.  Patient had a percocet today for pain  Facility reported that patient's son was on the way

## 2018-10-09 NOTE — ED Notes (Signed)
After EDP assisted pt with disimpaction, this nurse assisted pt to bedside commode. Call light within reach and pt instructed to not get up by herself.

## 2018-10-09 NOTE — ED Triage Notes (Signed)
Pt arrives to ED via [facility] from [facility]. Pt c/o abdominal pain and constipation. Last BM was 2-3 days ago. EMS reports that pt and pts room was covered feces on her hands from where she was trying to disimpact herself.   130 palp HR 80 CBG 186 96% RA 97.7

## 2018-10-09 NOTE — Discharge Instructions (Signed)
Continue current meds   Increase miralax to twice daily for a week   See your doctor.   Make sure you don't take too much percocet as it will cause you to be more constipated. Stay hydrated   Return to ER if you have abdominal pain, vomiting, fever, constipated for a week

## 2018-10-09 NOTE — ED Provider Notes (Signed)
[facility] DEPT Provider Note   CSN: [id number] Arrival date & time: 10/09/18  1546     History   Chief Complaint Chief Complaint  Patient presents with  . Abdominal Pain  . Constipation    HPI Sharon [name] is a 85 y.o. female hx of afib not on blood thinners, CHF, dementia, diabetes here presenting with constipation.  Patient is on chronic Percocet.  Patient is from [facility] and is also on MiraLAX and senna and stool softeners.  Patient states that she has been constipated for the last 3 days and tried to self disimpact.  She was noted to be covered in feces.  She denies any fevers or vomiting.     The history is provided by the patient.    Past Medical History:  Diagnosis Date  . Adult failure to thrive   . Anemia   . Atrial fibrillation ([place])   . Cardiac arrest ([place])   . Cataract   . CHF (congestive heart failure) ([place])   . Chronic kidney disease   . Dementia ([place])   . Diabetes mellitus without complication ([place])   . Edema    feet/ankles occas  . Hip fracture ([place])   . Hyperlipidemia   . Hypertension   . Hypothyroid   . Neuropathy   . Osteomyelitis ([place])    left first metatarsal  . Peripheral vascular disease ([place])   . Stroke [facility])     Patient Active Problem List   Diagnosis Date Noted  . Hypotension 05/28/2018  . Lymphedema 03/24/2018  . Pressure injury of skin 03/12/2018  . Atherosclerosis of native arteries of the extremities with ulceration ([place]) 05/07/2017  . Non-healing ulcer ([place]) 04/16/2017  . Diabetic neuropathy ([place]) 04/07/2017  . Septic shock ([place]) 03/20/2017  . CKD (chronic kidney disease), stage III ([place]) 03/19/2017  . Goals of care, counseling/discussion 03/03/2017  . PAD (peripheral artery disease) ([place]) 02/03/2017  . Abdominal aortic stenosis 02/03/2017  . Bilateral lower extremity edema 02/03/2017  . Anemia 01/29/2017  . B12 deficiency 01/29/2017  . Pancreatic mass  01/29/2017  . Hyponatremia 07/07/2015  . Foot ulcer ([place]) 02/07/2015  . Acute on chronic renal failure ([place]) 11/05/2014  . Diabetic foot infection ([place]) 10/10/2014  . Chronic diastolic heart failure ([place]) 05/31/2014  . HTN (hypertension) 05/31/2014  . DM (diabetes mellitus), type 2, uncontrolled ([place]) 05/31/2014    Past Surgical History:  Procedure Laterality Date  . ACHILLES TENDON SURGERY Left 02/08/2015   Procedure: ACHILLES LENGTHENING/KIDNER;  Surgeon: Albertine Patricia, DPM;  Location: [facility] ORS;  Service: Podiatry;  Laterality: Left;  . APPENDECTOMY    . CARDIAC CATHETERIZATION  08/25/13  . CATARACT EXTRACTION W/PHACO Left 07/20/2014   Procedure: CATARACT EXTRACTION PHACO AND INTRAOCULAR LENS PLACEMENT (IOC);  Surgeon: Leandrew Koyanagi, MD;  Location: [facility] ORS;  Service: Ophthalmology;  Laterality: Left;  [place]  1:18                 AP     23.6             CDE   9.69      lot #[phone]  . CHOLECYSTECTOMY    . CORONARY ANGIOPLASTY    . HALLUX VALGUS AKIN Left 02/08/2015   Procedure: HALLUX VALGUS AKIN/ KELLER;  Surgeon: Albertine Patricia, DPM;  Location: [facility] ORS;  Service: Podiatry;  Laterality: Left;  IVA with Local needs 1 hour for this case   . HEMIARTHROPLASTY HIP Right   .  HEMIARTHROPLASTY HIP Left   . INCISION AND DRAINAGE Left 10/11/2014   Procedure: Removal of infected tibial sessmoid;  Surgeon: Albertine Patricia, DPM;  Location: [facility] ORS;  Service: Podiatry;  Laterality: Left;  . IRRIGATION AND DEBRIDEMENT FOOT Right 03/21/2017   Procedure: IRRIGATION AND DEBRIDEMENT FOOT right great toe amputation;  Surgeon: Sharlotte Alamo, DPM;  Location: [facility] ORS;  Service: Podiatry;  Laterality: Right;  . IRRIGATION AND DEBRIDEMENT FOOT Right 04/18/2017   Procedure: IRRIGATION AND DEBRIDEMENT FOOT and application wound vac;  Surgeon: Albertine Patricia, DPM;  Location: [facility] ORS;  Service: Podiatry;  Laterality: Right;  . KYPHOPLASTY N/A 06/03/2018   Procedure: KYPHOPLASTY L1;  Surgeon: Hessie Knows,  MD;  Location: [facility] ORS;  Service: Orthopedics;  Laterality: N/A;  . PERIPHERAL VASCULAR BALLOON ANGIOPLASTY Right 04/17/2017   Procedure: PERIPHERAL VASCULAR BALLOON ANGIOPLASTY;  Surgeon: Katha Cabal, MD;  Location: [facility];  Service: Cardiovascular;  Laterality: Right;     OB History   No obstetric history on file.      Home Medications    Prior to Admission medications   Medication Sig Start Date End Date Taking? Authorizing Provider  acetaminophen (TYLENOL) 500 MG tablet Take 1,000 mg by mouth at bedtime. For pain management    [provider]  acetaminophen (TYLENOL) 650 MG CR tablet Take 650 mg by mouth every 8 (eight) hours as needed. For discomfort    [provider]  albuterol (PROVENTIL) (2.5 MG/3ML) 0.083% nebulizer solution Take 3 mLs (2.5 mg total) by nebulization every 6 (six) hours as needed for wheezing or shortness of breath. 06/03/18   Gouru, Aruna, MD  Amino Acids-Protein Hydrolys (FEEDING SUPPLEMENT, PRO-STAT SUGAR FREE 64,) LIQD Take 30 mLs by mouth 2 (two) times daily between meals.    [provider]  amiodarone (PACERONE) 200 MG tablet Take 1 tablet (200 mg total) by mouth daily. 03/24/17   Gladstone Lighter, MD  atorvastatin (LIPITOR) 10 MG tablet Take 10 mg by mouth daily at 10 pm.     [provider]  bisacodyl (DULCOLAX) 5 MG EC tablet Take 5 mg by mouth daily. 04/26/18   [provider]  cholecalciferol (VITAMIN D) 1000 units tablet Take 1,000 Units by mouth daily.    [provider]  collagenase (SANTYL) ointment Apply topically daily. 03/12/18   Stark Jock Jude, MD  docusate sodium (COLACE) 100 MG capsule Take 1 capsule (100 mg total) by mouth 2 (two) times daily as needed for mild constipation. 06/03/18   Gouru, Illene Silver, MD  ferrous sulfate 325 (65 FE) MG tablet Take 325 mg by mouth daily.     [provider]  fluconazole (DIFLUCAN) 150 MG tablet Take 1 tablet (150 mg total) by mouth  daily. 06/03/18   Gouru, Illene Silver, MD  furosemide (LASIX) 20 MG tablet Take 20 mg by mouth daily.  03/15/18   [provider]  gabapentin (NEURONTIN) 300 MG capsule Take 300 mg by mouth 3 (three) times daily. 04/24/18   [provider]  GLUCERNA (GLUCERNA) LIQD Take 237 mLs by mouth 2 (two) times daily between meals.    [provider]  HYDROcodone-acetaminophen (NORCO/VICODIN) 5-325 MG tablet Take 1 tablet by mouth 3 (three) times daily. 06/03/18   Gouru, Illene Silver, MD  insulin aspart (NOVOLOG) 100 UNIT/ML injection Inject 4-8 Units into the skin 3 (three) times daily. 04/24/18   [provider]  insulin glargine (LANTUS) 100 UNIT/ML injection Inject 15 Units into the skin at bedtime. 04/24/18 04/24/19  [provider]  JANUVIA 50 MG tablet Take 50 mg by mouth daily. Take along with Amaryl 02/15/18   [provider]  levothyroxine (SYNTHROID, LEVOTHROID) 112 MCG tablet Take 112 mcg by mouth daily before breakfast. 30 minutes before breakfast    [provider]  linaclotide (LINZESS) 145 MCG CAPS capsule Take 145 mcg by mouth daily. 05/11/18   [provider]  metoprolol tartrate (LOPRESSOR) 25 MG tablet Take 1 tablet (25 mg total) by mouth 2 (two) times daily. 04/21/17   Saundra Shelling, MD  Multiple Vitamin (MULTIVITAMIN WITH MINERALS) TABS tablet Take 1 tablet by mouth daily. 06/03/18   Nicholes Mango, MD  mupirocin ointment (BACTROBAN) 2 % Place 1 application into the nose 2 (two) times daily.    [provider]  ondansetron (ZOFRAN) 4 MG tablet Take 1 tablet (4 mg total) by mouth every 6 (six) hours as needed for nausea. 06/03/18   Nicholes Mango, MD  oxyCODONE-acetaminophen (PERCOCET/ROXICET) 5-325 MG tablet Take 1 tablet by mouth every 6 (six) hours as needed for severe pain. 06/03/18   Gouru, Illene Silver, MD  potassium chloride SA (K-DUR) 20 MEQ tablet Take 20 mEq by mouth daily. 05/03/18   [provider]  simethicone (MYLICON) 80 MG  chewable tablet Chew 80 mg by mouth every 6 (six) hours. 05/11/18   [provider]  traMADol (ULTRAM) 50 MG tablet Take 1 tablet (50 mg total) by mouth every 6 (six) hours as needed for moderate pain. 06/03/18   Nicholes Mango, MD  traZODone (DESYREL) 50 MG tablet Take 50 mg by mouth at bedtime. For anxiety 06/24/17 06/24/18  [provider]  vitamin C (VITAMIN C) 250 MG tablet Take 1 tablet (250 mg total) by mouth 2 (two) times daily. 06/03/18   Nicholes Mango, MD    Family History Family History  Problem Relation Age of Onset  . Heart attack Mother     Social History Social History   Tobacco Use  . Smoking status: Former Smoker    Packs/day: 0.50    Years: 7.00    Pack years: 3.50    Types: Cigarettes    Quit date: 05/30/1989    Years since quitting: 29.3  . Smokeless tobacco: Never Used  Substance Use Topics  . Alcohol use: No    Alcohol/week: 0.0 standard drinks  . Drug use: No     Allergies   Carbamazepine, Cymbalta [duloxetine hcl], Duloxetine, Lyrica [pregabalin], and Temazepam   Review of Systems Review of Systems  Gastrointestinal: Positive for abdominal pain and constipation.  All other systems reviewed and are negative.    Physical Exam Updated Vital Signs BP 133/86   Pulse 75   Temp 97.8 F (36.6 C) (Oral)   Resp 17   Ht 5\' 4"  (1.626 m)   Wt 54.4 kg   SpO2 97%   BMI 20.59 kg/m   Physical Exam Vitals signs and nursing note reviewed.  Constitutional:      Comments: Chronically ill, uncomfortable   HENT:     Head: Normocephalic.     Mouth/Throat:     Mouth: Mucous membranes are moist.  Eyes:     Extraocular Movements: Extraocular movements intact.  Cardiovascular:     Rate and Rhythm: Normal rate and regular rhythm.  Pulmonary:     Effort: Pulmonary effort is normal.     Breath sounds: Normal breath sounds.  Abdominal:     General: Abdomen is flat.     Palpations: Abdomen is soft.  Comments: No abdominal tenderness    Genitourinary:    Comments: Rectal- stool impaction. Stage 1 sacral decub  Skin:    General: Skin is warm.     Capillary Refill: Capillary refill takes less than 2 seconds.  Neurological:     General: No focal deficit present.     Mental Status: She is alert and oriented to person, place, and time.  Psychiatric:        Mood and Affect: Mood normal.      ED Treatments / Results  Labs (all labs ordered are listed, but only abnormal results are displayed) Labs Reviewed  COMPREHENSIVE METABOLIC PANEL - Abnormal; Notable for the following components:      Result Value   CO2 19 (*)    Glucose, Bld 154 (*)    Creatinine, Ser 1.20 (*)    Albumin 3.4 (*)    AST 67 (*)    ALT 55 (*)    Alkaline Phosphatase 213 (*)    GFR calc non Af Amer 42 (*)    GFR calc Af Amer 48 (*)    All other components within normal limits  CBC WITH DIFFERENTIAL/PLATELET  LIPASE, BLOOD    EKG None  Radiology Dg Abd Portable 1 View  Result Date: 10/09/2018 CLINICAL DATA:  Constipation EXAM: PORTABLE ABDOMEN - 1 VIEW COMPARISON:  April 29, 2018 FINDINGS: Nonobstructive bowel gas pattern. No significant fecal loading. The patient is status post vertebroplasty of an upper lumbar vertebral body. A vascular stent is seen over the lower abdomen. No other acute abnormalities identified. IMPRESSION: No constipation seen.  No acute abnormalities. Electronically Signed   By: Dorise Bullion III M.D   On: 10/09/2018 17:46    Procedures Fecal disimpaction  Date/Time: 10/09/2018 4:59 PM Performed by: Drenda Freeze, MD Authorized by: Drenda Freeze, MD  Consent: Verbal consent obtained. Risks and benefits: risks, benefits and alternatives were discussed Consent given by: patient Patient understanding: patient states understanding of the procedure being performed Patient consent: the patient's understanding of the procedure matches consent given Patient identity confirmed: verbally with patient Local  anesthesia used: yes  Anesthesia: Local anesthesia used: yes Local Anesthetic: topical anesthetic Anesthetic total: 5 mL  Sedation: Patient sedated: no  Patient tolerance: patient tolerated the procedure well with no immediate complications    (including critical care time)  Medications Ordered in ED Medications  lidocaine (XYLOCAINE) 2 % jelly 1 application (1 application Topical Given 10/09/18 1621)  oxyCODONE-acetaminophen (PERCOCET/ROXICET) 5-325 MG per tablet 1 tablet (1 tablet Oral Given 10/09/18 1735)     Initial Impression / Assessment and Plan / ED Course  I have reviewed the triage vital signs and the nursing notes.  Pertinent labs & imaging results that were available during my care of the patient were reviewed by me and considered in my medical decision making (see chart for details).       Sharon [name] is a 85 y.o. female here with stool impaction. She is on chronic pain meds. Will disimpact and get basic labs, xrays. Low suspicion for SBO.    6:40 PM Patient had multiple large bowel movements. Labs and xrays unremarkable. Updated son. Will increase miralax to twice daily. Stable for dc back to facility.   Final Clinical Impressions(s) / ED Diagnoses   Final diagnoses:  None    ED Discharge Orders    None       Drenda Freeze, MD 10/09/18 1842

## 2018-10-09 NOTE — ED Notes (Signed)
This nurse attempted lab draw twice, consulted phlebotomy to try and get lab work. Phlebotomy at bedside currently.

## 2018-10-11 DIAGNOSIS — G8929 Other chronic pain: Secondary | ICD-10-CM | POA: Diagnosis not present

## 2018-10-11 DIAGNOSIS — F0391 Unspecified dementia with behavioral disturbance: Secondary | ICD-10-CM | POA: Diagnosis not present

## 2018-10-11 DIAGNOSIS — F419 Anxiety disorder, unspecified: Secondary | ICD-10-CM | POA: Diagnosis not present

## 2018-10-11 DIAGNOSIS — K5900 Constipation, unspecified: Secondary | ICD-10-CM | POA: Diagnosis not present

## 2018-10-14 DIAGNOSIS — L89624 Pressure ulcer of left heel, stage 4: Secondary | ICD-10-CM | POA: Diagnosis not present

## 2018-10-14 DIAGNOSIS — M6281 Muscle weakness (generalized): Secondary | ICD-10-CM | POA: Diagnosis not present

## 2018-10-14 DIAGNOSIS — L89614 Pressure ulcer of right heel, stage 4: Secondary | ICD-10-CM | POA: Diagnosis not present

## 2018-10-14 DIAGNOSIS — I5032 Chronic diastolic (congestive) heart failure: Secondary | ICD-10-CM | POA: Diagnosis not present

## 2018-10-15 ENCOUNTER — Other Ambulatory Visit: Payer: Self-pay

## 2018-10-15 ENCOUNTER — Non-Acute Institutional Stay: Payer: PPO | Admitting: Hospice

## 2018-10-15 DIAGNOSIS — Z515 Encounter for palliative care: Secondary | ICD-10-CM

## 2018-10-15 NOTE — Progress Notes (Signed)
[facility] Consult Note Telephone: [phone]  Fax: [phone]  PATIENT NAME: Sharon [name] DOB: Jul 19, 1933 MRN: [id number]  PRIMARY CARE PROVIDER:   Rusty Aus, MD  REFERRING PROVIDER:  [place] Blattenberger NP  RESPONSIBLE PARTY:  Self.  Brittannie Tawney, son [phone]    RECOMMENDATIONS/PLAN:  1. Advance Care Planning/Goals of Care:  Visit consisted of discussions on Palliative Medicine as specialized medical care for people living with serious illness, aimed at facilitating advance care plan, symptoms relief and establishing goals of care. She said her goals of care is to maximize quality of life and symptom management. She elected to be DNR, selected limited additional intervention, antibiotics, IV fluids and tube feeding for a defined trial period. When Kennon Rounds was called and informed of patient's wishes, he declined those and said she is sometimes inconsistent. He said no change to full code status at this time.  Mickel Baas RN [facility] MDSC at [facility] care was called and advised to destroy the forms that were signed and allow full code status until otherwise directed. She verbalized understanding to do so accordingly.  2. Symptom management: Member continues on lasix as ordered for congestive heart failure with good result. No recent flare. 3. Follow up Palliative Care Visit: Palliative care will continue to follow for goals of care clarification and symptom management.   I spent 50 minutes providing this consultation, from 12.30 to 1.20pm. More than 50% of the time in this consultation was spent on coordinating advance care communication.  HISTORY OF PRESENT ILLNESS:  Sharon [name] is a 85 y.o. year old female with multiple medical problems including a-fib, CHF, DMT2,L1 compression fracture post kyphoplasty. Palliative Care was asked to help address goals of care.  CODE STATUS: Full  PPS:40% HOSPICE  ELIGIBILITY/DIAGNOSIS: TBD  PAST MEDICAL HISTORY:  Past Medical History:  Diagnosis Date  . Adult failure to thrive   . Anemia   . Atrial fibrillation ([place])   . Cardiac arrest ([place])   . Cataract   . CHF (congestive heart failure) ([place])   . Chronic kidney disease   . Dementia ([place])   . Diabetes mellitus without complication ([place])   . Edema    feet/ankles occas  . Hip fracture ([place])   . Hyperlipidemia   . Hypertension   . Hypothyroid   . Neuropathy   . Osteomyelitis ([place])    left first metatarsal  . Peripheral vascular disease ([place])   . Stroke [facility])     SOCIAL HX:  Social History   Tobacco Use  . Smoking status: Former Smoker    Packs/day: 0.50    Years: 7.00    Pack years: 3.50    Types: Cigarettes    Quit date: 05/30/1989    Years since quitting: 29.3  . Smokeless tobacco: Never Used  Substance Use Topics  . Alcohol use: No    Alcohol/week: 0.0 standard drinks    ALLERGIES:  Allergies  Allergen Reactions  . Carbamazepine Other (See Comments)    Reaction: unknown  . Cymbalta [Duloxetine Hcl] Other (See Comments)    Confusion, disorientation  . Duloxetine     Other reaction(s): Other (See Comments) Confusion, disorientation  . Lyrica [Pregabalin] Other (See Comments)    Patient and son states this makes the patient confused.  . Temazepam     Other reaction(s): Hallucination     PERTINENT MEDICATIONS:  Outpatient Encounter Medications as of 10/15/2018  Medication Sig  . acetaminophen (TYLENOL) 500 MG  tablet Take 1,000 mg by mouth at bedtime. For pain management  . acetaminophen (TYLENOL) 650 MG CR tablet Take 650 mg by mouth every 8 (eight) hours as needed. For discomfort  . albuterol (PROVENTIL) (2.5 MG/3ML) 0.083% nebulizer solution Take 3 mLs (2.5 mg total) by nebulization every 6 (six) hours as needed for wheezing or shortness of breath.  . Amino Acids-Protein Hydrolys (FEEDING SUPPLEMENT, PRO-STAT SUGAR FREE 64,) LIQD Take 30 mLs by mouth 2 (two) times  daily between meals.  Marland Kitchen amiodarone (PACERONE) 200 MG tablet Take 1 tablet (200 mg total) by mouth daily.  Marland Kitchen atorvastatin (LIPITOR) 10 MG tablet Take 10 mg by mouth daily at 10 pm.   . bisacodyl (DULCOLAX) 5 MG EC tablet Take 5 mg by mouth daily.  . cholecalciferol (VITAMIN D) 1000 units tablet Take 1,000 Units by mouth daily.  . collagenase (SANTYL) ointment Apply topically daily.  Marland Kitchen docusate sodium (COLACE) 100 MG capsule Take 1 capsule (100 mg total) by mouth 2 (two) times daily as needed for mild constipation.  . ferrous sulfate 325 (65 FE) MG tablet Take 325 mg by mouth daily.   . fluconazole (DIFLUCAN) 150 MG tablet Take 1 tablet (150 mg total) by mouth daily.  . furosemide (LASIX) 20 MG tablet Take 20 mg by mouth daily.   Marland Kitchen gabapentin (NEURONTIN) 300 MG capsule Take 300 mg by mouth 3 (three) times daily.  Marland Kitchen GLUCERNA (GLUCERNA) LIQD Take 237 mLs by mouth 2 (two) times daily between meals.  Marland Kitchen HYDROcodone-acetaminophen (NORCO/VICODIN) 5-325 MG tablet Take 1 tablet by mouth 3 (three) times daily.  . insulin aspart (NOVOLOG) 100 UNIT/ML injection Inject 4-8 Units into the skin 3 (three) times daily.  . insulin glargine (LANTUS) 100 UNIT/ML injection Inject 15 Units into the skin at bedtime.  Marland Kitchen JANUVIA 50 MG tablet Take 50 mg by mouth daily. Take along with Amaryl  . levothyroxine (SYNTHROID, LEVOTHROID) 112 MCG tablet Take 112 mcg by mouth daily before breakfast. 30 minutes before breakfast  . linaclotide (LINZESS) 145 MCG CAPS capsule Take 145 mcg by mouth daily.  . metoprolol tartrate (LOPRESSOR) 25 MG tablet Take 1 tablet (25 mg total) by mouth 2 (two) times daily.  . Multiple Vitamin (MULTIVITAMIN WITH MINERALS) TABS tablet Take 1 tablet by mouth daily.  . mupirocin ointment (BACTROBAN) 2 % Place 1 application into the nose 2 (two) times daily.  . ondansetron (ZOFRAN) 4 MG tablet Take 1 tablet (4 mg total) by mouth every 6 (six) hours as needed for nausea.  Marland Kitchen oxyCODONE-acetaminophen  (PERCOCET/ROXICET) 5-325 MG tablet Take 1 tablet by mouth every 6 (six) hours as needed for severe pain.  . polyethylene glycol (MIRALAX / GLYCOLAX) 17 g packet Take 17 g by mouth 2 (two) times daily.  . potassium chloride SA (K-DUR) 20 MEQ tablet Take 20 mEq by mouth daily.  . simethicone (MYLICON) 80 MG chewable tablet Chew 80 mg by mouth every 6 (six) hours.  . traMADol (ULTRAM) 50 MG tablet Take 1 tablet (50 mg total) by mouth every 6 (six) hours as needed for moderate pain.  . traZODone (DESYREL) 50 MG tablet Take 50 mg by mouth at bedtime. For anxiety  . vitamin C (VITAMIN C) 250 MG tablet Take 1 tablet (250 mg total) by mouth 2 (two) times daily.   Facility-Administered Encounter Medications as of 10/15/2018  Medication  . sodium chloride flush (NS) 0.9 % injection 10 mL    ROS/PHYSICAL EXAM:   General: cooperativeNAD, frail appearing, thin Cardiovascular:  denies chest pain Pulmonary: normal respiratory effort, no SOB, no cough Skin: no rashes/wounds to exposed skin Neurological: Weakness. Alert and oriented x person, place, time and to her situation; no confusion noted during visit  Teodoro Spray, NP

## 2018-10-18 DIAGNOSIS — F0391 Unspecified dementia with behavioral disturbance: Secondary | ICD-10-CM | POA: Diagnosis not present

## 2018-10-18 DIAGNOSIS — R451 Restlessness and agitation: Secondary | ICD-10-CM | POA: Diagnosis not present

## 2018-10-18 DIAGNOSIS — F419 Anxiety disorder, unspecified: Secondary | ICD-10-CM | POA: Diagnosis not present

## 2018-10-18 DIAGNOSIS — R627 Adult failure to thrive: Secondary | ICD-10-CM | POA: Diagnosis not present

## 2018-10-20 DIAGNOSIS — Z5181 Encounter for therapeutic drug level monitoring: Secondary | ICD-10-CM | POA: Diagnosis not present

## 2018-10-20 DIAGNOSIS — Z139 Encounter for screening, unspecified: Secondary | ICD-10-CM | POA: Diagnosis not present

## 2018-10-20 DIAGNOSIS — N390 Urinary tract infection, site not specified: Secondary | ICD-10-CM | POA: Diagnosis not present

## 2018-10-20 DIAGNOSIS — Z79899 Other long term (current) drug therapy: Secondary | ICD-10-CM | POA: Diagnosis not present

## 2018-10-21 DIAGNOSIS — L89624 Pressure ulcer of left heel, stage 4: Secondary | ICD-10-CM | POA: Diagnosis not present

## 2018-10-21 DIAGNOSIS — L89614 Pressure ulcer of right heel, stage 4: Secondary | ICD-10-CM | POA: Diagnosis not present

## 2018-10-22 DIAGNOSIS — F5101 Primary insomnia: Secondary | ICD-10-CM | POA: Diagnosis not present

## 2018-10-22 DIAGNOSIS — F331 Major depressive disorder, recurrent, moderate: Secondary | ICD-10-CM | POA: Diagnosis not present

## 2018-10-22 DIAGNOSIS — F0150 Vascular dementia without behavioral disturbance: Secondary | ICD-10-CM | POA: Diagnosis not present

## 2018-10-22 DIAGNOSIS — F0391 Unspecified dementia with behavioral disturbance: Secondary | ICD-10-CM | POA: Diagnosis not present

## 2018-10-22 DIAGNOSIS — F330 Major depressive disorder, recurrent, mild: Secondary | ICD-10-CM | POA: Diagnosis not present

## 2018-10-22 DIAGNOSIS — F064 Anxiety disorder due to known physiological condition: Secondary | ICD-10-CM | POA: Diagnosis not present

## 2018-10-22 DIAGNOSIS — M6281 Muscle weakness (generalized): Secondary | ICD-10-CM | POA: Diagnosis not present

## 2018-10-22 DIAGNOSIS — F05 Delirium due to known physiological condition: Secondary | ICD-10-CM | POA: Diagnosis not present

## 2018-10-22 DIAGNOSIS — N390 Urinary tract infection, site not specified: Secondary | ICD-10-CM | POA: Diagnosis not present

## 2018-10-28 DIAGNOSIS — L89614 Pressure ulcer of right heel, stage 4: Secondary | ICD-10-CM | POA: Diagnosis not present

## 2018-11-01 DIAGNOSIS — F0391 Unspecified dementia with behavioral disturbance: Secondary | ICD-10-CM | POA: Diagnosis not present

## 2018-11-01 DIAGNOSIS — L89629 Pressure ulcer of left heel, unspecified stage: Secondary | ICD-10-CM | POA: Diagnosis not present

## 2018-11-01 DIAGNOSIS — R627 Adult failure to thrive: Secondary | ICD-10-CM | POA: Diagnosis not present

## 2018-11-01 DIAGNOSIS — F419 Anxiety disorder, unspecified: Secondary | ICD-10-CM | POA: Diagnosis not present

## 2018-11-04 DIAGNOSIS — L89624 Pressure ulcer of left heel, stage 4: Secondary | ICD-10-CM | POA: Diagnosis not present

## 2018-11-04 DIAGNOSIS — M545 Low back pain: Secondary | ICD-10-CM | POA: Diagnosis not present

## 2018-11-04 DIAGNOSIS — R627 Adult failure to thrive: Secondary | ICD-10-CM | POA: Diagnosis not present

## 2018-11-04 DIAGNOSIS — R300 Dysuria: Secondary | ICD-10-CM | POA: Diagnosis not present

## 2018-11-04 DIAGNOSIS — F064 Anxiety disorder due to known physiological condition: Secondary | ICD-10-CM | POA: Diagnosis not present

## 2018-11-04 DIAGNOSIS — I5032 Chronic diastolic (congestive) heart failure: Secondary | ICD-10-CM | POA: Diagnosis not present

## 2018-11-04 DIAGNOSIS — F331 Major depressive disorder, recurrent, moderate: Secondary | ICD-10-CM | POA: Diagnosis not present

## 2018-11-04 DIAGNOSIS — F5101 Primary insomnia: Secondary | ICD-10-CM | POA: Diagnosis not present

## 2018-11-04 DIAGNOSIS — F0150 Vascular dementia without behavioral disturbance: Secondary | ICD-10-CM | POA: Diagnosis not present

## 2018-11-05 ENCOUNTER — Ambulatory Visit (INDEPENDENT_AMBULATORY_CARE_PROVIDER_SITE_OTHER): Payer: PPO | Admitting: Vascular Surgery

## 2018-11-05 ENCOUNTER — Encounter (INDEPENDENT_AMBULATORY_CARE_PROVIDER_SITE_OTHER): Payer: PPO

## 2018-11-05 DIAGNOSIS — N390 Urinary tract infection, site not specified: Secondary | ICD-10-CM | POA: Diagnosis not present

## 2018-11-05 DIAGNOSIS — Z139 Encounter for screening, unspecified: Secondary | ICD-10-CM | POA: Diagnosis not present

## 2018-11-08 DIAGNOSIS — F05 Delirium due to known physiological condition: Secondary | ICD-10-CM | POA: Diagnosis not present

## 2018-11-08 DIAGNOSIS — R451 Restlessness and agitation: Secondary | ICD-10-CM | POA: Diagnosis not present

## 2018-11-08 DIAGNOSIS — F0391 Unspecified dementia with behavioral disturbance: Secondary | ICD-10-CM | POA: Diagnosis not present

## 2018-11-08 DIAGNOSIS — N390 Urinary tract infection, site not specified: Secondary | ICD-10-CM | POA: Diagnosis not present

## 2018-11-11 DIAGNOSIS — L89624 Pressure ulcer of left heel, stage 4: Secondary | ICD-10-CM | POA: Diagnosis not present

## 2018-11-15 DIAGNOSIS — F0391 Unspecified dementia with behavioral disturbance: Secondary | ICD-10-CM | POA: Diagnosis not present

## 2018-11-18 DIAGNOSIS — L89624 Pressure ulcer of left heel, stage 4: Secondary | ICD-10-CM | POA: Diagnosis not present

## 2018-11-25 DIAGNOSIS — L89624 Pressure ulcer of left heel, stage 4: Secondary | ICD-10-CM | POA: Diagnosis not present

## 2018-11-26 DIAGNOSIS — F0150 Vascular dementia without behavioral disturbance: Secondary | ICD-10-CM | POA: Diagnosis not present

## 2018-11-26 DIAGNOSIS — F5101 Primary insomnia: Secondary | ICD-10-CM | POA: Diagnosis not present

## 2018-11-26 DIAGNOSIS — F064 Anxiety disorder due to known physiological condition: Secondary | ICD-10-CM | POA: Diagnosis not present

## 2018-11-26 DIAGNOSIS — F331 Major depressive disorder, recurrent, moderate: Secondary | ICD-10-CM | POA: Diagnosis not present

## 2018-11-29 DIAGNOSIS — F332 Major depressive disorder, recurrent severe without psychotic features: Secondary | ICD-10-CM | POA: Diagnosis not present

## 2018-11-29 DIAGNOSIS — N390 Urinary tract infection, site not specified: Secondary | ICD-10-CM | POA: Diagnosis not present

## 2018-11-29 DIAGNOSIS — F0391 Unspecified dementia with behavioral disturbance: Secondary | ICD-10-CM | POA: Diagnosis not present

## 2018-11-29 DIAGNOSIS — F419 Anxiety disorder, unspecified: Secondary | ICD-10-CM | POA: Diagnosis not present

## 2018-11-30 ENCOUNTER — Encounter (INDEPENDENT_AMBULATORY_CARE_PROVIDER_SITE_OTHER): Payer: PPO

## 2018-11-30 ENCOUNTER — Encounter (INDEPENDENT_AMBULATORY_CARE_PROVIDER_SITE_OTHER): Payer: Self-pay

## 2018-11-30 ENCOUNTER — Ambulatory Visit (INDEPENDENT_AMBULATORY_CARE_PROVIDER_SITE_OTHER): Payer: PPO | Admitting: Vascular Surgery

## 2018-11-30 DIAGNOSIS — H269 Unspecified cataract: Secondary | ICD-10-CM | POA: Diagnosis not present

## 2018-11-30 DIAGNOSIS — E1165 Type 2 diabetes mellitus with hyperglycemia: Secondary | ICD-10-CM | POA: Diagnosis not present

## 2018-11-30 DIAGNOSIS — Z96643 Presence of artificial hip joint, bilateral: Secondary | ICD-10-CM | POA: Diagnosis not present

## 2018-11-30 DIAGNOSIS — Z8673 Personal history of transient ischemic attack (TIA), and cerebral infarction without residual deficits: Secondary | ICD-10-CM | POA: Diagnosis not present

## 2018-11-30 DIAGNOSIS — E860 Dehydration: Secondary | ICD-10-CM | POA: Diagnosis not present

## 2018-11-30 DIAGNOSIS — E038 Other specified hypothyroidism: Secondary | ICD-10-CM | POA: Diagnosis not present

## 2018-11-30 DIAGNOSIS — R0602 Shortness of breath: Secondary | ICD-10-CM | POA: Diagnosis not present

## 2018-11-30 DIAGNOSIS — G8929 Other chronic pain: Secondary | ICD-10-CM | POA: Diagnosis not present

## 2018-11-30 DIAGNOSIS — K5900 Constipation, unspecified: Secondary | ICD-10-CM | POA: Diagnosis not present

## 2018-11-30 DIAGNOSIS — E11649 Type 2 diabetes mellitus with hypoglycemia without coma: Secondary | ICD-10-CM | POA: Diagnosis not present

## 2018-11-30 DIAGNOSIS — Z89411 Acquired absence of right great toe: Secondary | ICD-10-CM | POA: Diagnosis not present

## 2018-11-30 DIAGNOSIS — J9600 Acute respiratory failure, unspecified whether with hypoxia or hypercapnia: Secondary | ICD-10-CM | POA: Diagnosis not present

## 2018-11-30 DIAGNOSIS — I480 Paroxysmal atrial fibrillation: Secondary | ICD-10-CM | POA: Diagnosis not present

## 2018-11-30 DIAGNOSIS — R627 Adult failure to thrive: Secondary | ICD-10-CM | POA: Diagnosis not present

## 2018-11-30 DIAGNOSIS — R404 Transient alteration of awareness: Secondary | ICD-10-CM | POA: Diagnosis not present

## 2018-11-30 DIAGNOSIS — E1122 Type 2 diabetes mellitus with diabetic chronic kidney disease: Secondary | ICD-10-CM | POA: Diagnosis not present

## 2018-11-30 DIAGNOSIS — J1289 Other viral pneumonia: Secondary | ICD-10-CM | POA: Diagnosis not present

## 2018-11-30 DIAGNOSIS — E1140 Type 2 diabetes mellitus with diabetic neuropathy, unspecified: Secondary | ICD-10-CM | POA: Diagnosis not present

## 2018-11-30 DIAGNOSIS — N189 Chronic kidney disease, unspecified: Secondary | ICD-10-CM | POA: Diagnosis not present

## 2018-11-30 DIAGNOSIS — U071 COVID-19: Secondary | ICD-10-CM | POA: Diagnosis not present

## 2018-11-30 DIAGNOSIS — I5032 Chronic diastolic (congestive) heart failure: Secondary | ICD-10-CM | POA: Diagnosis not present

## 2018-11-30 DIAGNOSIS — R410 Disorientation, unspecified: Secondary | ICD-10-CM | POA: Diagnosis not present

## 2018-11-30 DIAGNOSIS — N1832 Chronic kidney disease, stage 3b: Secondary | ICD-10-CM | POA: Diagnosis not present

## 2018-11-30 DIAGNOSIS — E872 Acidosis: Secondary | ICD-10-CM | POA: Diagnosis not present

## 2018-11-30 DIAGNOSIS — R451 Restlessness and agitation: Secondary | ICD-10-CM | POA: Diagnosis not present

## 2018-11-30 DIAGNOSIS — F419 Anxiety disorder, unspecified: Secondary | ICD-10-CM | POA: Diagnosis not present

## 2018-11-30 DIAGNOSIS — L89629 Pressure ulcer of left heel, unspecified stage: Secondary | ICD-10-CM | POA: Diagnosis not present

## 2018-11-30 DIAGNOSIS — L89624 Pressure ulcer of left heel, stage 4: Secondary | ICD-10-CM | POA: Diagnosis not present

## 2018-11-30 DIAGNOSIS — J069 Acute upper respiratory infection, unspecified: Secondary | ICD-10-CM | POA: Diagnosis not present

## 2018-11-30 DIAGNOSIS — Z9049 Acquired absence of other specified parts of digestive tract: Secondary | ICD-10-CM | POA: Diagnosis not present

## 2018-11-30 DIAGNOSIS — R0902 Hypoxemia: Secondary | ICD-10-CM | POA: Diagnosis not present

## 2018-11-30 DIAGNOSIS — J9601 Acute respiratory failure with hypoxia: Secondary | ICD-10-CM | POA: Diagnosis not present

## 2018-11-30 DIAGNOSIS — N179 Acute kidney failure, unspecified: Secondary | ICD-10-CM | POA: Diagnosis not present

## 2018-11-30 DIAGNOSIS — I10 Essential (primary) hypertension: Secondary | ICD-10-CM | POA: Diagnosis not present

## 2018-11-30 DIAGNOSIS — G9341 Metabolic encephalopathy: Secondary | ICD-10-CM | POA: Diagnosis not present

## 2018-11-30 DIAGNOSIS — E039 Hypothyroidism, unspecified: Secondary | ICD-10-CM | POA: Diagnosis not present

## 2018-11-30 DIAGNOSIS — M6281 Muscle weakness (generalized): Secondary | ICD-10-CM | POA: Diagnosis not present

## 2018-11-30 DIAGNOSIS — N183 Chronic kidney disease, stage 3 unspecified: Secondary | ICD-10-CM | POA: Diagnosis not present

## 2018-11-30 DIAGNOSIS — Z66 Do not resuscitate: Secondary | ICD-10-CM | POA: Diagnosis not present

## 2018-11-30 DIAGNOSIS — E785 Hyperlipidemia, unspecified: Secondary | ICD-10-CM | POA: Diagnosis not present

## 2018-11-30 DIAGNOSIS — L89610 Pressure ulcer of right heel, unstageable: Secondary | ICD-10-CM | POA: Diagnosis not present

## 2018-11-30 DIAGNOSIS — G4700 Insomnia, unspecified: Secondary | ICD-10-CM | POA: Diagnosis not present

## 2018-11-30 DIAGNOSIS — F0391 Unspecified dementia with behavioral disturbance: Secondary | ICD-10-CM | POA: Diagnosis not present

## 2018-11-30 DIAGNOSIS — I4891 Unspecified atrial fibrillation: Secondary | ICD-10-CM | POA: Diagnosis not present

## 2018-11-30 DIAGNOSIS — N390 Urinary tract infection, site not specified: Secondary | ICD-10-CM | POA: Diagnosis not present

## 2018-11-30 DIAGNOSIS — E46 Unspecified protein-calorie malnutrition: Secondary | ICD-10-CM | POA: Diagnosis not present

## 2018-11-30 DIAGNOSIS — E1151 Type 2 diabetes mellitus with diabetic peripheral angiopathy without gangrene: Secondary | ICD-10-CM | POA: Diagnosis not present

## 2018-11-30 DIAGNOSIS — R0689 Other abnormalities of breathing: Secondary | ICD-10-CM | POA: Diagnosis not present

## 2018-11-30 DIAGNOSIS — L89614 Pressure ulcer of right heel, stage 4: Secondary | ICD-10-CM | POA: Diagnosis not present

## 2018-11-30 DIAGNOSIS — I130 Hypertensive heart and chronic kidney disease with heart failure and stage 1 through stage 4 chronic kidney disease, or unspecified chronic kidney disease: Secondary | ICD-10-CM | POA: Diagnosis not present

## 2018-11-30 DIAGNOSIS — G894 Chronic pain syndrome: Secondary | ICD-10-CM | POA: Diagnosis not present

## 2018-11-30 DIAGNOSIS — M79672 Pain in left foot: Secondary | ICD-10-CM | POA: Diagnosis not present

## 2018-11-30 DIAGNOSIS — F411 Generalized anxiety disorder: Secondary | ICD-10-CM | POA: Diagnosis not present

## 2018-11-30 DIAGNOSIS — L89620 Pressure ulcer of left heel, unstageable: Secondary | ICD-10-CM | POA: Diagnosis not present

## 2018-11-30 DIAGNOSIS — D649 Anemia, unspecified: Secondary | ICD-10-CM | POA: Diagnosis not present

## 2018-11-30 DIAGNOSIS — I739 Peripheral vascular disease, unspecified: Secondary | ICD-10-CM | POA: Diagnosis not present

## 2018-12-03 DIAGNOSIS — L89629 Pressure ulcer of left heel, unspecified stage: Secondary | ICD-10-CM | POA: Diagnosis not present

## 2018-12-03 DIAGNOSIS — G894 Chronic pain syndrome: Secondary | ICD-10-CM | POA: Diagnosis not present

## 2018-12-03 DIAGNOSIS — U071 COVID-19: Secondary | ICD-10-CM | POA: Diagnosis not present

## 2018-12-03 DIAGNOSIS — M79672 Pain in left foot: Secondary | ICD-10-CM | POA: Diagnosis not present

## 2018-12-06 DIAGNOSIS — U071 COVID-19: Secondary | ICD-10-CM | POA: Diagnosis not present

## 2018-12-06 DIAGNOSIS — F411 Generalized anxiety disorder: Secondary | ICD-10-CM | POA: Diagnosis not present

## 2018-12-06 DIAGNOSIS — R451 Restlessness and agitation: Secondary | ICD-10-CM | POA: Diagnosis not present

## 2018-12-06 DIAGNOSIS — F0391 Unspecified dementia with behavioral disturbance: Secondary | ICD-10-CM | POA: Diagnosis not present

## 2018-12-08 DIAGNOSIS — R410 Disorientation, unspecified: Secondary | ICD-10-CM | POA: Diagnosis not present

## 2018-12-08 DIAGNOSIS — R451 Restlessness and agitation: Secondary | ICD-10-CM | POA: Diagnosis not present

## 2018-12-08 DIAGNOSIS — U071 COVID-19: Secondary | ICD-10-CM | POA: Diagnosis not present

## 2018-12-08 DIAGNOSIS — F0391 Unspecified dementia with behavioral disturbance: Secondary | ICD-10-CM | POA: Diagnosis not present

## 2018-12-10 ENCOUNTER — Emergency Department (HOSPITAL_COMMUNITY): Payer: PPO

## 2018-12-10 ENCOUNTER — Encounter (HOSPITAL_COMMUNITY): Payer: Self-pay | Admitting: Emergency Medicine

## 2018-12-10 ENCOUNTER — Other Ambulatory Visit: Payer: Self-pay

## 2018-12-10 ENCOUNTER — Inpatient Hospital Stay (HOSPITAL_COMMUNITY)
Admission: EM | Admit: 2018-12-10 | Discharge: 2018-12-14 | DRG: 177 | Disposition: E | Payer: PPO | Attending: Internal Medicine | Admitting: Internal Medicine

## 2018-12-10 DIAGNOSIS — U071 COVID-19: Principal | ICD-10-CM | POA: Diagnosis present

## 2018-12-10 DIAGNOSIS — J9601 Acute respiratory failure with hypoxia: Secondary | ICD-10-CM | POA: Diagnosis present

## 2018-12-10 DIAGNOSIS — I5032 Chronic diastolic (congestive) heart failure: Secondary | ICD-10-CM | POA: Diagnosis present

## 2018-12-10 DIAGNOSIS — E1122 Type 2 diabetes mellitus with diabetic chronic kidney disease: Secondary | ICD-10-CM | POA: Diagnosis present

## 2018-12-10 DIAGNOSIS — I130 Hypertensive heart and chronic kidney disease with heart failure and stage 1 through stage 4 chronic kidney disease, or unspecified chronic kidney disease: Secondary | ICD-10-CM | POA: Diagnosis present

## 2018-12-10 DIAGNOSIS — E861 Hypovolemia: Secondary | ICD-10-CM | POA: Diagnosis present

## 2018-12-10 DIAGNOSIS — E860 Dehydration: Secondary | ICD-10-CM | POA: Diagnosis present

## 2018-12-10 DIAGNOSIS — Z8674 Personal history of sudden cardiac arrest: Secondary | ICD-10-CM

## 2018-12-10 DIAGNOSIS — I480 Paroxysmal atrial fibrillation: Secondary | ICD-10-CM | POA: Diagnosis not present

## 2018-12-10 DIAGNOSIS — Z961 Presence of intraocular lens: Secondary | ICD-10-CM | POA: Diagnosis present

## 2018-12-10 DIAGNOSIS — E1151 Type 2 diabetes mellitus with diabetic peripheral angiopathy without gangrene: Secondary | ICD-10-CM | POA: Diagnosis present

## 2018-12-10 DIAGNOSIS — L89620 Pressure ulcer of left heel, unstageable: Secondary | ICD-10-CM | POA: Diagnosis present

## 2018-12-10 DIAGNOSIS — E11649 Type 2 diabetes mellitus with hypoglycemia without coma: Secondary | ICD-10-CM | POA: Diagnosis present

## 2018-12-10 DIAGNOSIS — Z9049 Acquired absence of other specified parts of digestive tract: Secondary | ICD-10-CM | POA: Diagnosis not present

## 2018-12-10 DIAGNOSIS — J1289 Other viral pneumonia: Secondary | ICD-10-CM | POA: Diagnosis not present

## 2018-12-10 DIAGNOSIS — E785 Hyperlipidemia, unspecified: Secondary | ICD-10-CM | POA: Diagnosis not present

## 2018-12-10 DIAGNOSIS — Z8673 Personal history of transient ischemic attack (TIA), and cerebral infarction without residual deficits: Secondary | ICD-10-CM

## 2018-12-10 DIAGNOSIS — R0902 Hypoxemia: Secondary | ICD-10-CM

## 2018-12-10 DIAGNOSIS — E1165 Type 2 diabetes mellitus with hyperglycemia: Secondary | ICD-10-CM | POA: Diagnosis not present

## 2018-12-10 DIAGNOSIS — IMO0002 Reserved for concepts with insufficient information to code with codable children: Secondary | ICD-10-CM | POA: Diagnosis present

## 2018-12-10 DIAGNOSIS — Z66 Do not resuscitate: Secondary | ICD-10-CM | POA: Diagnosis not present

## 2018-12-10 DIAGNOSIS — Z9842 Cataract extraction status, left eye: Secondary | ICD-10-CM

## 2018-12-10 DIAGNOSIS — L89151 Pressure ulcer of sacral region, stage 1: Secondary | ICD-10-CM | POA: Diagnosis present

## 2018-12-10 DIAGNOSIS — Z9862 Peripheral vascular angioplasty status: Secondary | ICD-10-CM

## 2018-12-10 DIAGNOSIS — G9341 Metabolic encephalopathy: Secondary | ICD-10-CM | POA: Diagnosis present

## 2018-12-10 DIAGNOSIS — R404 Transient alteration of awareness: Secondary | ICD-10-CM | POA: Diagnosis not present

## 2018-12-10 DIAGNOSIS — F0391 Unspecified dementia with behavioral disturbance: Secondary | ICD-10-CM | POA: Diagnosis not present

## 2018-12-10 DIAGNOSIS — E039 Hypothyroidism, unspecified: Secondary | ICD-10-CM | POA: Diagnosis not present

## 2018-12-10 DIAGNOSIS — Z888 Allergy status to other drugs, medicaments and biological substances status: Secondary | ICD-10-CM

## 2018-12-10 DIAGNOSIS — E1140 Type 2 diabetes mellitus with diabetic neuropathy, unspecified: Secondary | ICD-10-CM | POA: Diagnosis present

## 2018-12-10 DIAGNOSIS — J9600 Acute respiratory failure, unspecified whether with hypoxia or hypercapnia: Secondary | ICD-10-CM | POA: Diagnosis not present

## 2018-12-10 DIAGNOSIS — Z9861 Coronary angioplasty status: Secondary | ICD-10-CM

## 2018-12-10 DIAGNOSIS — Z96643 Presence of artificial hip joint, bilateral: Secondary | ICD-10-CM | POA: Diagnosis present

## 2018-12-10 DIAGNOSIS — E872 Acidosis: Secondary | ICD-10-CM | POA: Diagnosis not present

## 2018-12-10 DIAGNOSIS — Z7189 Other specified counseling: Secondary | ICD-10-CM

## 2018-12-10 DIAGNOSIS — R0602 Shortness of breath: Secondary | ICD-10-CM | POA: Diagnosis not present

## 2018-12-10 DIAGNOSIS — N189 Chronic kidney disease, unspecified: Secondary | ICD-10-CM | POA: Diagnosis present

## 2018-12-10 DIAGNOSIS — N179 Acute kidney failure, unspecified: Secondary | ICD-10-CM | POA: Diagnosis not present

## 2018-12-10 DIAGNOSIS — J069 Acute upper respiratory infection, unspecified: Secondary | ICD-10-CM | POA: Diagnosis present

## 2018-12-10 DIAGNOSIS — G8929 Other chronic pain: Secondary | ICD-10-CM | POA: Diagnosis present

## 2018-12-10 DIAGNOSIS — I10 Essential (primary) hypertension: Secondary | ICD-10-CM | POA: Diagnosis not present

## 2018-12-10 DIAGNOSIS — Z8249 Family history of ischemic heart disease and other diseases of the circulatory system: Secondary | ICD-10-CM

## 2018-12-10 DIAGNOSIS — F03918 Unspecified dementia, unspecified severity, with other behavioral disturbance: Secondary | ICD-10-CM | POA: Diagnosis present

## 2018-12-10 DIAGNOSIS — Z89411 Acquired absence of right great toe: Secondary | ICD-10-CM

## 2018-12-10 DIAGNOSIS — Z794 Long term (current) use of insulin: Secondary | ICD-10-CM

## 2018-12-10 DIAGNOSIS — R0689 Other abnormalities of breathing: Secondary | ICD-10-CM | POA: Diagnosis not present

## 2018-12-10 DIAGNOSIS — N1832 Chronic kidney disease, stage 3b: Secondary | ICD-10-CM | POA: Diagnosis not present

## 2018-12-10 DIAGNOSIS — Z79899 Other long term (current) drug therapy: Secondary | ICD-10-CM

## 2018-12-10 DIAGNOSIS — Z87891 Personal history of nicotine dependence: Secondary | ICD-10-CM

## 2018-12-10 DIAGNOSIS — L89610 Pressure ulcer of right heel, unstageable: Secondary | ICD-10-CM | POA: Diagnosis present

## 2018-12-10 DIAGNOSIS — Z79890 Hormone replacement therapy (postmenopausal): Secondary | ICD-10-CM

## 2018-12-10 DIAGNOSIS — Z79891 Long term (current) use of opiate analgesic: Secondary | ICD-10-CM

## 2018-12-10 LAB — POC SARS CORONAVIRUS 2 AG -  ED: SARS Coronavirus 2 Ag: POSITIVE — AB

## 2018-12-10 LAB — URINALYSIS, ROUTINE W REFLEX MICROSCOPIC
Bilirubin Urine: NEGATIVE
Glucose, UA: 50 mg/dL — AB
Ketones, ur: NEGATIVE mg/dL
Nitrite: NEGATIVE
Protein, ur: >=300 mg/dL — AB
Specific Gravity, Urine: 1.023 (ref 1.005–1.030)
WBC, UA: >50 WBC/hpf — ABNORMAL HIGH (ref 0–5)
pH: 5 (ref 5.0–8.0)

## 2018-12-10 LAB — CBC WITH DIFFERENTIAL/PLATELET
Abs Immature Granulocytes: 0.18 10*3/uL — ABNORMAL HIGH (ref 0.00–0.07)
Basophils Absolute: 0 10*3/uL (ref 0.0–0.1)
Basophils Relative: 0 %
Eosinophils Absolute: 0 10*3/uL (ref 0.0–0.5)
Eosinophils Relative: 0 %
HCT: 38.2 % (ref 36.0–46.0)
Hemoglobin: 11.9 g/dL — ABNORMAL LOW (ref 12.0–15.0)
Immature Granulocytes: 2 %
Lymphocytes Relative: 5 %
Lymphs Abs: 0.7 10*3/uL (ref 0.7–4.0)
MCH: 27.4 pg (ref 26.0–34.0)
MCHC: 31.2 g/dL (ref 30.0–36.0)
MCV: 88 fL (ref 80.0–100.0)
Monocytes Absolute: 0.5 10*3/uL (ref 0.1–1.0)
Monocytes Relative: 4 %
Neutro Abs: 11 10*3/uL — ABNORMAL HIGH (ref 1.7–7.7)
Neutrophils Relative %: 89 %
Platelets: 209 10*3/uL (ref 150–400)
RBC: 4.34 MIL/uL (ref 3.87–5.11)
RDW: 14.2 % (ref 11.5–15.5)
WBC: 12.4 10*3/uL — ABNORMAL HIGH (ref 4.0–10.5)
nRBC: 0 % (ref 0.0–0.2)

## 2018-12-10 LAB — COMPREHENSIVE METABOLIC PANEL
ALT: 55 U/L — ABNORMAL HIGH (ref 0–44)
AST: 98 U/L — ABNORMAL HIGH (ref 15–41)
Albumin: 2 g/dL — ABNORMAL LOW (ref 3.5–5.0)
Alkaline Phosphatase: 79 U/L (ref 38–126)
Anion gap: 12 (ref 5–15)
BUN: 40 mg/dL — ABNORMAL HIGH (ref 8–23)
CO2: 17 mmol/L — ABNORMAL LOW (ref 22–32)
Calcium: 7.3 mg/dL — ABNORMAL LOW (ref 8.9–10.3)
Chloride: 105 mmol/L (ref 98–111)
Creatinine, Ser: 1.95 mg/dL — ABNORMAL HIGH (ref 0.44–1.00)
GFR calc Af Amer: 27 mL/min — ABNORMAL LOW (ref >60)
GFR calc non Af Amer: 23 mL/min — ABNORMAL LOW (ref >60)
Glucose, Bld: 160 mg/dL — ABNORMAL HIGH (ref 70–99)
Potassium: 5.2 mmol/L — ABNORMAL HIGH (ref 3.5–5.1)
Sodium: 134 mmol/L — ABNORMAL LOW (ref 135–145)
Total Bilirubin: 0.3 mg/dL (ref 0.3–1.2)
Total Protein: 5.5 g/dL — ABNORMAL LOW (ref 6.5–8.1)

## 2018-12-10 LAB — LACTATE DEHYDROGENASE: LDH: 434 U/L — ABNORMAL HIGH (ref 98–192)

## 2018-12-10 LAB — D-DIMER, QUANTITATIVE: D-Dimer, Quant: 2.25 ug/mL-FEU — ABNORMAL HIGH (ref 0.00–0.50)

## 2018-12-10 LAB — FIBRINOGEN: Fibrinogen: 610 mg/dL — ABNORMAL HIGH (ref 210–475)

## 2018-12-10 LAB — C-REACTIVE PROTEIN: CRP: 15.2 mg/dL — ABNORMAL HIGH (ref <1.0)

## 2018-12-10 LAB — BRAIN NATRIURETIC PEPTIDE: B Natriuretic Peptide: 816.8 pg/mL — ABNORMAL HIGH (ref 0.0–100.0)

## 2018-12-10 LAB — TRIGLYCERIDES: Triglycerides: 151 mg/dL — ABNORMAL HIGH (ref <150)

## 2018-12-10 LAB — PROCALCITONIN: Procalcitonin: 0.95 ng/mL

## 2018-12-10 LAB — FERRITIN: Ferritin: 599 ng/mL — ABNORMAL HIGH (ref 11–307)

## 2018-12-10 LAB — LACTIC ACID, PLASMA: Lactic Acid, Venous: 2.5 mmol/L (ref 0.5–1.9)

## 2018-12-10 MED ORDER — SODIUM CHLORIDE 0.9 % IV SOLN: 250.0000 mL | INTRAVENOUS | Status: DC | PRN

## 2018-12-10 MED ORDER — INSULIN GLARGINE 100 UNIT/ML ~~LOC~~ SOLN
10.0000 [IU] | Freq: Every day | SUBCUTANEOUS | Status: DC
  Filled 2018-12-10: qty 0.1

## 2018-12-10 MED ORDER — IPRATROPIUM BROMIDE HFA 17 MCG/ACT IN AERS
2.0000 | INHALATION_SPRAY | Freq: Once | RESPIRATORY_TRACT | Status: AC
  Administered 2018-12-10: 2 via RESPIRATORY_TRACT
  Filled 2018-12-10: qty 12.9

## 2018-12-10 MED ORDER — ONDANSETRON HCL 4 MG PO TABS: 4.0000 mg | ORAL_TABLET | Freq: Four times a day (QID) | ORAL | Status: DC | PRN

## 2018-12-10 MED ORDER — ENOXAPARIN SODIUM 30 MG/0.3ML ~~LOC~~ SOLN
30.0000 mg | SUBCUTANEOUS | Status: DC
  Administered 2018-12-11: 30 mg via SUBCUTANEOUS
  Filled 2018-12-10: qty 0.3

## 2018-12-10 MED ORDER — SODIUM CHLORIDE 0.9 % IV SOLN
500.0000 mg | INTRAVENOUS | Status: DC
  Administered 2018-12-11: 500 mg via INTRAVENOUS
  Filled 2018-12-10 (×2): qty 500

## 2018-12-10 MED ORDER — SODIUM CHLORIDE 0.9% FLUSH
3.0000 mL | Freq: Two times a day (BID) | INTRAVENOUS | Status: DC
  Administered 2018-12-10 – 2018-12-11 (×2): 3 mL via INTRAVENOUS

## 2018-12-10 MED ORDER — AMIODARONE HCL 100 MG PO TABS
200.0000 mg | ORAL_TABLET | Freq: Every day | ORAL | Status: DC
  Administered 2018-12-11: 200 mg via ORAL
  Filled 2018-12-10: qty 2

## 2018-12-10 MED ORDER — ZOLPIDEM TARTRATE 5 MG PO TABS: 5.0000 mg | ORAL_TABLET | Freq: Every evening | ORAL | Status: DC | PRN

## 2018-12-10 MED ORDER — ACETAMINOPHEN 325 MG PO TABS: 650.0000 mg | ORAL_TABLET | Freq: Four times a day (QID) | ORAL | Status: DC | PRN

## 2018-12-10 MED ORDER — SODIUM CHLORIDE 0.9 % IV SOLN
100.0000 mg | INTRAVENOUS | Status: DC
  Administered 2018-12-11: 100 mg via INTRAVENOUS
  Filled 2018-12-10: qty 100

## 2018-12-10 MED ORDER — SENNA-DOCUSATE SODIUM 8.6-50 MG PO TABS: 2.0000 | ORAL_TABLET | Freq: Two times a day (BID) | ORAL | Status: DC

## 2018-12-10 MED ORDER — OXYCODONE HCL 5 MG PO TABS: 5.0000 mg | ORAL_TABLET | ORAL | Status: DC | PRN

## 2018-12-10 MED ORDER — SODIUM CHLORIDE 0.9 % IV SOLN
200.0000 mg | Freq: Once | INTRAVENOUS | Status: AC
  Administered 2018-12-10: 200 mg via INTRAVENOUS
  Filled 2018-12-10: qty 40

## 2018-12-10 MED ORDER — SODIUM CHLORIDE 0.9% FLUSH: 3.0000 mL | INTRAVENOUS | Status: DC | PRN

## 2018-12-10 MED ORDER — POLYETHYLENE GLYCOL 3350 17 G PO PACK: 17.0000 g | PACK | Freq: Every day | ORAL | Status: DC

## 2018-12-10 MED ORDER — INSULIN ASPART 100 UNIT/ML ~~LOC~~ SOLN: 0.0000 [IU] | Freq: Three times a day (TID) | SUBCUTANEOUS | Status: DC

## 2018-12-10 MED ORDER — ALBUTEROL SULFATE HFA 108 (90 BASE) MCG/ACT IN AERS
4.0000 | INHALATION_SPRAY | RESPIRATORY_TRACT | Status: DC | PRN
  Administered 2018-12-11: 4 via RESPIRATORY_TRACT
  Filled 2018-12-10 (×2): qty 6.7

## 2018-12-10 MED ORDER — BISACODYL 5 MG PO TBEC
5.0000 mg | DELAYED_RELEASE_TABLET | Freq: Every day | ORAL | Status: DC
  Administered 2018-12-11: 5 mg via ORAL
  Filled 2018-12-10: qty 1

## 2018-12-10 MED ORDER — ONDANSETRON HCL 4 MG/2ML IJ SOLN: 4.0000 mg | Freq: Four times a day (QID) | INTRAMUSCULAR | Status: DC | PRN

## 2018-12-10 MED ORDER — DEXAMETHASONE SODIUM PHOSPHATE 10 MG/ML IJ SOLN
6.0000 mg | INTRAMUSCULAR | Status: DC
  Administered 2018-12-11: 6 mg via INTRAVENOUS
  Filled 2018-12-10: qty 1

## 2018-12-10 MED ORDER — ALBUTEROL SULFATE HFA 108 (90 BASE) MCG/ACT IN AERS
4.0000 | INHALATION_SPRAY | Freq: Once | RESPIRATORY_TRACT | Status: AC
  Administered 2018-12-10: 4 via RESPIRATORY_TRACT
  Filled 2018-12-10: qty 6.7

## 2018-12-10 MED ORDER — SODIUM CHLORIDE 0.9 % IV SOLN
1.0000 g | INTRAVENOUS | Status: DC
  Administered 2018-12-10 – 2018-12-11 (×2): 1 g via INTRAVENOUS
  Filled 2018-12-10 (×2): qty 10

## 2018-12-10 MED ORDER — SODIUM CHLORIDE 0.9 % IV BOLUS
500.0000 mL | Freq: Once | INTRAVENOUS | Status: AC
  Administered 2018-12-10: 500 mL via INTRAVENOUS

## 2018-12-10 MED ORDER — MORPHINE SULFATE (PF) 2 MG/ML IV SOLN
2.0000 mg | INTRAVENOUS | Status: DC
  Administered 2018-12-10 – 2018-12-11 (×7): 2 mg via INTRAVENOUS
  Filled 2018-12-10 (×7): qty 1

## 2018-12-10 MED ORDER — LORAZEPAM 2 MG/ML IJ SOLN
1.0000 mg | Freq: Once | INTRAMUSCULAR | Status: AC
  Administered 2018-12-10: 1 mg via INTRAVENOUS
  Filled 2018-12-10: qty 1

## 2018-12-10 MED ORDER — SENNOSIDES-DOCUSATE SODIUM 8.6-50 MG PO TABS: 2.0000 | ORAL_TABLET | Freq: Two times a day (BID) | ORAL | Status: DC

## 2018-12-10 MED ORDER — LEVOTHYROXINE SODIUM 112 MCG PO TABS
112.0000 ug | ORAL_TABLET | Freq: Every day | ORAL | Status: DC
  Filled 2018-12-10 (×2): qty 1

## 2018-12-10 MED ORDER — SODIUM CHLORIDE 0.9 % IV SOLN
INTRAVENOUS | Status: DC
  Administered 2018-12-10 (×2): via INTRAVENOUS

## 2018-12-10 NOTE — ED Notes (Signed)
[name] patient son calling asking for a call back needing to know what is going on with patient Call when we can [phone]

## 2018-12-10 NOTE — ED Notes (Signed)
Called [facility] for transport to [facility]. 

## 2018-12-10 NOTE — ED Notes (Signed)
Pharmacy notified to verify pt medications

## 2018-12-10 NOTE — ED Notes (Signed)
ED TO INPATIENT HANDOFF REPORT  ED Nurse Name and Phone #: Lunette Stands [place] Name/Age/Gender Sharon [name] 85 y.o. female Room/Bed: 023C/023C  Code Status   Code Status: Full Code  Home/SNF/Other Nursing Home Patient oriented to: self, place, time and situation Is this baseline? Yes   Triage Complete: Triage complete  Chief Complaint covid +/sob  Triage Note Pt is positive COVID from [facility] heart , pt had been fine and about 1 hour ago pt became sob and her sats dropped into the 70s on ra placed on NRB at 15 and sats have risen to 94 bp upon ems arrival 88/54 , lips were dusky and she was using accessory muscles, has 22 rt hand left arm restricted    Allergies Allergies  Allergen Reactions  . Carbamazepine Other (See Comments)    "Allergic," per MAR  . Cymbalta [Duloxetine Hcl] Other (See Comments)    Confusion, disorientation, and "Allergic," per MAR  . Duloxetine Other (See Comments)    Confusion, disorientation, and "Allergic," per MAR  . Lyrica [Pregabalin] Other (See Comments)    Patient and son states this makes the patient confused ("Allergic," per [facility])  . Temazepam Other (See Comments)    Hallucinations and "Allergic," per MAR    Level of Care/Admitting Diagnosis ED Disposition    ED Disposition Condition [place]: [place] [[id number]]  Level of Care: Progressive [102]  Covid Evaluation: Confirmed COVID Positive  Diagnosis: Acute respiratory disease due to COVID-19 virus [[id number]]  Admitting Physician: Karmen Bongo [[id number]]  Attending Physician: Karmen Bongo [[id number]]  Estimated length of stay: 5 - 7 days  Certification:: I certify this patient will need inpatient services for at least 2 midnights  PT Class (Do Not Modify): Inpatient [101]  PT Acc Code (Do Not Modify): Private [1]       B Medical/Surgery History Past Medical History:  Diagnosis Date  . Adult failure to thrive   . Anemia   .  Atrial fibrillation ([place])   . Cardiac arrest ([place])   . Cataract   . CHF (congestive heart failure) ([place])   . Chronic kidney disease   . Dementia ([place])   . Diabetes mellitus without complication ([place])   . Edema    feet/ankles occas  . Hip fracture ([place])   . Hyperlipidemia   . Hypertension   . Hypothyroid   . Neuropathy   . Osteomyelitis ([place])    left first metatarsal  . Peripheral vascular disease ([place])   . Stroke [facility])    Past Surgical History:  Procedure Laterality Date  . ACHILLES TENDON SURGERY Left 02/08/2015   Procedure: ACHILLES LENGTHENING/KIDNER;  Surgeon: Albertine Patricia, DPM;  Location: [facility] ORS;  Service: Podiatry;  Laterality: Left;  . APPENDECTOMY    . CARDIAC CATHETERIZATION  08/25/13  . CATARACT EXTRACTION W/PHACO Left 07/20/2014   Procedure: CATARACT EXTRACTION PHACO AND INTRAOCULAR LENS PLACEMENT (IOC);  Surgeon: Leandrew Koyanagi, MD;  Location: [facility] ORS;  Service: Ophthalmology;  Laterality: Left;  [place]  1:18                 AP     23.6             CDE   9.69      lot #[phone]  . CHOLECYSTECTOMY    . CORONARY ANGIOPLASTY    . HALLUX VALGUS AKIN Left 02/08/2015   Procedure: HALLUX VALGUS AKIN/ KELLER;  Surgeon: Albertine Patricia, DPM;  Location:  [facility] ORS;  Service: Podiatry;  Laterality: Left;  IVA with Local needs 1 hour for this case   . HEMIARTHROPLASTY HIP Right   . HEMIARTHROPLASTY HIP Left   . INCISION AND DRAINAGE Left 10/11/2014   Procedure: Removal of infected tibial sessmoid;  Surgeon: Albertine Patricia, DPM;  Location: [facility] ORS;  Service: Podiatry;  Laterality: Left;  . IRRIGATION AND DEBRIDEMENT FOOT Right 03/21/2017   Procedure: IRRIGATION AND DEBRIDEMENT FOOT right great toe amputation;  Surgeon: Sharlotte Alamo, DPM;  Location: [facility] ORS;  Service: Podiatry;  Laterality: Right;  . IRRIGATION AND DEBRIDEMENT FOOT Right 04/18/2017   Procedure: IRRIGATION AND DEBRIDEMENT FOOT and application wound vac;  Surgeon: Albertine Patricia, DPM;  Location: [facility] ORS;   Service: Podiatry;  Laterality: Right;  . KYPHOPLASTY N/A 06/03/2018   Procedure: KYPHOPLASTY L1;  Surgeon: Hessie Knows, MD;  Location: [facility] ORS;  Service: Orthopedics;  Laterality: N/A;  . PERIPHERAL VASCULAR BALLOON ANGIOPLASTY Right 04/17/2017   Procedure: PERIPHERAL VASCULAR BALLOON ANGIOPLASTY;  Surgeon: Katha Cabal, MD;  Location: [place] [facility] LAB;  Service: Cardiovascular;  Laterality: Right;     A IV Location/Drains/Wounds Patient Lines/Drains/Airways Status   Active Line/Drains/Airways    Name:   Placement date:   Placement time:   Site:   Days:   Peripheral IV 12/02/2018 Right Forearm   12/05/2018    1459    Forearm   less than 1   Peripheral IV 11/29/2018 Right Hand   11/18/2018    1459    Hand   less than 1   PICC Single Lumen 07/12/18 PICC Left Brachial 41 cm 0 cm   07/12/18    1719    Brachial   151   Sheath 04/17/17 Left   04/17/17    1225    -   602   Sheath 04/17/17 Right   04/17/17    1235    -   602   Negative Pressure Wound Therapy Foot Right   04/18/17    0940    -   601   External Urinary Catheter   05/28/18    2010    -   196   Incision (Closed) 11/05/14 Foot Upper;Left   11/05/14    1638     1496   Incision (Closed) 02/08/15 Foot Left   02/08/15    1142     1401   Incision (Closed) 03/21/17 Foot Right   03/21/17    0936     629   Incision (Closed) 04/18/17 Foot Right   04/18/17    0959     601   Incision (Closed) 06/03/18 Back Other (Comment)   06/03/18    0854     190   Incision - 1 Port Other (Comment) Left   03/21/17    0939     629   Pressure Injury 04/16/17 Stage III -  Full thickness tissue loss. Subcutaneous fat may be visible but bone, tendon or muscle are NOT exposed.   04/16/17    1800     603   Pressure Injury 04/16/17 Unstageable - Full thickness tissue loss in which the base of the ulcer is covered by slough (yellow, tan, gray, green or brown) and/or eschar (tan, brown or black) in the wound bed. Hard area on center of left heel covered in ha    04/16/17    1800     603   Pressure Injury 03/09/18 Stage I -  Intact skin with  non-blanchable redness of a localized area usually over a bony prominence. very small open area   03/09/18    0430     276   Pressure Injury 03/10/18 Deep Tissue Injury - Purple or maroon localized area of discolored intact skin or blood-filled blister due to damage of underlying soft tissue from pressure and/or shear. dark purple, lateral right heel, bottom of left heel   03/10/18    2056     275   Pressure Injury 05/28/18 Stage II -  Partial thickness loss of dermis presenting as a shallow open ulcer with a red, pink wound bed without slough. 1.5 cm X 1.5 cm    05/28/18    2010     196   Pressure Injury 07/12/18 Coccyx Posterior Stage II -  Partial thickness loss of dermis presenting as a shallow open ulcer with a red, pink wound bed without slough.   07/12/18    1511     151   Wound / Incision (Open or Dehisced) 04/16/17 Diabetic ulcer;Incision - Dehisced Foot Right wound wrapped unable to assess   04/16/17    1800    Foot   603          Intake/Output Last 24 hours No intake or output data in the 24 hours ending 12/04/2018 1703  Labs/Imaging Results for orders placed or performed during the hospital encounter of 11/28/2018 (from the past 48 hour(s))  CBC WITH DIFFERENTIAL     Status: Abnormal   Collection Time: 11/14/2018  2:45 PM  Result Value Ref Range   WBC 12.4 (H) 4.0 - 10.5 K/uL   RBC 4.34 3.87 - 5.11 MIL/uL   Hemoglobin 11.9 (L) 12.0 - 15.0 g/dL   HCT 38.2 36.0 - 46.0 %   MCV 88.0 80.0 - 100.0 fL   MCH 27.4 26.0 - 34.0 pg   MCHC 31.2 30.0 - 36.0 g/dL   RDW 14.2 11.5 - 15.5 %   Platelets 209 150 - 400 K/uL   nRBC 0.0 0.0 - 0.2 %   Neutrophils Relative % 89 %   Neutro Abs 11.0 (H) 1.7 - 7.7 K/uL   Lymphocytes Relative 5 %   Lymphs Abs 0.7 0.7 - 4.0 K/uL   Monocytes Relative 4 %   Monocytes Absolute 0.5 0.1 - 1.0 K/uL   Eosinophils Relative 0 %   Eosinophils Absolute 0.0 0.0 - 0.5 K/uL   Basophils  Relative 0 %   Basophils Absolute 0.0 0.0 - 0.1 K/uL   Immature Granulocytes 2 %   Abs Immature Granulocytes 0.18 (H) 0.00 - 0.07 K/uL    Comment: Performed at [facility], [address], [place], [place] [address]  Comprehensive metabolic panel     Status: Abnormal   Collection Time: 11/24/2018  2:45 PM  Result Value Ref Range   Sodium 134 (L) 135 - 145 mmol/L   Potassium 5.2 (H) 3.5 - 5.1 mmol/L   Chloride 105 98 - 111 mmol/L   CO2 17 (L) 22 - 32 mmol/L   Glucose, Bld 160 (H) 70 - 99 mg/dL   BUN 40 (H) 8 - 23 mg/dL   Creatinine, Ser 1.95 (H) 0.44 - 1.00 mg/dL   Calcium 7.3 (L) 8.9 - 10.3 mg/dL   Total Protein 5.5 (L) 6.5 - 8.1 g/dL   Albumin 2.0 (L) 3.5 - 5.0 g/dL   AST 98 (H) 15 - 41 U/L   ALT 55 (H) 0 - 44 U/L   Alkaline Phosphatase  79 38 - 126 U/L   Total Bilirubin 0.3 0.3 - 1.2 mg/dL   GFR calc non Af Amer 23 (L) >60 mL/min   GFR calc Af Amer 27 (L) >60 mL/min   Anion gap 12 5 - 15    Comment: Performed at [facility] [address] [place], [place] [address]  Procalcitonin     Status: None   Collection Time: 11/22/2018  2:45 PM  Result Value Ref Range   Procalcitonin 0.95 ng/mL    Comment:        Interpretation: PCT > 0.5 ng/mL and <= 2 ng/mL: Systemic infection (sepsis) is possible, but other conditions are known to elevate PCT as well. (NOTE)       Sepsis PCT Algorithm           Lower Respiratory Tract                                      Infection PCT Algorithm    ----------------------------     ----------------------------         PCT < 0.25 ng/mL                PCT < 0.10 ng/mL         Strongly encourage             Strongly discourage   discontinuation of antibiotics    initiation of antibiotics    ----------------------------     -----------------------------       PCT 0.25 - 0.50 ng/mL            PCT 0.10 - 0.25 ng/mL               OR       >80% decrease in PCT            Discourage initiation of                                             antibiotics      Encourage discontinuation           of antibiotics    ----------------------------     -----------------------------         PCT >= 0.50 ng/mL              PCT 0.26 - 0.50 ng/mL                AND       <80% decrease in PCT             Encourage initiation of                                             antibiotics       Encourage continuation           of antibiotics    ----------------------------     -----------------------------        PCT >= 0.50 ng/mL                  PCT > 0.50 ng/mL               AND  increase in PCT                  Strongly encourage                                      initiation of antibiotics    Strongly encourage escalation           of antibiotics                                     -----------------------------                                           PCT <= 0.25 ng/mL                                                 OR                                        > 80% decrease in PCT                                     Discontinue / Do not initiate                                             antibiotics Performed at [facility], [address] [place], [place] [address]   Lactate dehydrogenase     Status: Abnormal   Collection Time: 11/26/2018  2:45 PM  Result Value Ref Range   LDH 434 (H) 98 - 192 U/L    Comment: Performed at [facility], [place] [address] [place], [place] [address]  Triglycerides     Status: Abnormal   Collection Time: 12/04/2018  2:45 PM  Result Value Ref Range   Triglycerides 151 (H) <150 mg/dL    Comment: Performed at [facility] [address] [place], [place] [address]  Brain natriuretic peptide     Status: Abnormal   Collection Time: 12/05/2018  2:58 PM  Result Value Ref Range   B Natriuretic Peptide 816.8 (H) 0.0 - 100.0 pg/mL    Comment: Performed at [facility] [address] [place], [place] [address]  Lactic acid, plasma     Status: Abnormal   Collection Time: 12/05/2018   3:30 PM  Result Value Ref Range   Lactic Acid, Venous 2.5 (HH) 0.5 - 1.9 mmol/L    Comment: CRITICAL RESULT CALLED TO, READ BACK BY AND VERIFIED WITH: L.Chella Chapdelaine,RN 1623 12/01/2018 CLARK,S Performed at [facility], [place] [address] [place], [place] [address]   C-reactive protein     Status: Abnormal   Collection Time: 12/01/2018  3:33 PM  Result Value Ref Range   CRP 15.2 (H) <1.0 mg/dL    Comment:  Performed at [facility], [place] [address], [place], [place] [address]  D-dimer, quantitative (not at [facility])     Status: Abnormal   Collection Time: 11/17/2018  3:33 PM  Result Value Ref Range   D-Dimer, Quant 2.25 (H) 0.00 - 0.50 ug/mL-FEU    Comment: (NOTE) At the manufacturer cut-off of 0.50 ug/mL FEU, this assay has been documented to exclude PE with a sensitivity and negative predictive value of 97 to 99%.  At this time, this assay has not been approved by the FDA to exclude DVT/VTE. Results should be correlated with clinical presentation. Performed at [facility], [place] [address], [place], [place] [address]   Ferritin     Status: Abnormal   Collection Time: 12/06/2018  3:33 PM  Result Value Ref Range   Ferritin 599 (H) 11 - 307 ng/mL    Comment: Performed at [facility], [place] [address] [place], [place] [address]  Fibrinogen     Status: Abnormal   Collection Time: 11/15/2018  3:33 PM  Result Value Ref Range   Fibrinogen 610 (H) 210 - 475 mg/dL    Comment: Performed at [facility] [address] [place], [place] [address]  POC SARS Coronavirus 2 Ag-ED - Nasal Swab (BD Veritor Kit)     Status: Abnormal   Collection Time: 11/17/2018  3:47 PM  Result Value Ref Range   SARS Coronavirus 2 Ag POSITIVE (A) NEGATIVE    Comment: (NOTE) SARS-CoV-2 antigen PRESENT. Positive results indicate the presence of viral antigens, but clinical correlation with patient history and other diagnostic information is necessary to determine patient infection status.  Positive  results do not rule out bacterial infection or co-infection  with other viruses. False positive results are rare but can occur, and confirmatory RT-PCR testing may be appropriate in some circumstances. The expected result is Negative. Fact Sheet for Patients: PodPark.tn Fact Sheet for Providers: GiftContent.is  This test is not yet approved or cleared by the [place] FDA and  has been authorized for detection and/or diagnosis of SARS-CoV-2 by FDA under an Emergency Use Authorization (EUA).  This EUA will remain in effect (meaning this test can be used) for the duration of  the COVID-19 declaration under Section 564(b)(1) of the Act, 21 U.S.C. section 360bbb-3(b)(1), unless the a uthorization is terminated or revoked sooner.   UA     Status: Abnormal   Collection Time: 11/15/2018  4:15 PM  Result Value Ref Range   Color, Urine YELLOW YELLOW   APPearance CLOUDY (A) CLEAR   Specific Gravity, Urine 1.023 1.005 - 1.030   pH 5.0 5.0 - 8.0   Glucose, UA 50 (A) NEGATIVE mg/dL   Hgb urine dipstick SMALL (A) NEGATIVE   Bilirubin Urine NEGATIVE NEGATIVE   Ketones, ur NEGATIVE NEGATIVE mg/dL   Protein, ur >=300 (A) NEGATIVE mg/dL   Nitrite NEGATIVE NEGATIVE   Leukocytes,Ua LARGE (A) NEGATIVE   RBC / HPF 0-5 0 - 5 RBC/hpf   WBC, UA >50 (H) 0 - 5 WBC/hpf   Bacteria, UA FEW (A) NONE SEEN   Squamous Epithelial / LPF 0-5 0 - 5   Mucus PRESENT    Hyaline Casts, UA PRESENT    Granular Casts, UA PRESENT     Comment: Performed at [facility], [address] [place], [place] [address]   Dg Chest Port 1 View  Result Date: 11/14/2018 CLINICAL DATA:  Shortness of breath EXAM: PORTABLE CHEST 1 VIEW  COMPARISON:  04/21/2018 FINDINGS: Multifocal airspace opacities are noted. There is no pneumothorax. No large pleural effusion. The heart size is stable from prior study. There is no acute osseous abnormality. Again noted are findings of  in L1 vertebral augmentation procedure. IMPRESSION: Multifocal airspace opacities concerning for multifocal pneumonia (viral or bacterial). Electronically Signed   By: Constance Holster M.D.   On: 12/09/2018 15:11    Pending Labs Unresulted Labs (From admission, onward)    Start     Ordered   12-13-18 0500  CBC with Differential/Platelet  Daily,   R     11/23/2018 1659   12/13/2018 0500  Comprehensive metabolic panel  Daily,   R     11/20/2018 1659   13-Dec-2018 0500  C-reactive protein  Daily,   R     12/08/2018 1659   12/13/18 0500  D-dimer, quantitative (not at [facility])  Daily,   R     11/27/2018 1659   13-Dec-2018 0500  Ferritin  Daily,   R     11/25/2018 1659   12-13-18 0500  Magnesium  Daily,   R     12/02/2018 1659   December 13, 2018 0500  Phosphorus  Daily,   R     12/08/2018 1659   11/21/2018 1615  Urine culture  Once,   STAT     12/03/2018 1614          Vitals/Pain Today's Vitals   11/20/2018 1600 12/06/2018 1602 11/17/2018 1645 12/05/2018 1700  BP: (!) 122/97  (!) 147/64   Pulse: 86 86  84  Resp: (!) 21 (!) 21 20 (!) 27  Temp:      TempSrc:      SpO2: 95% 95%  94%    Isolation Precautions Airborne and Contact precautions  Medications Medications  albuterol (VENTOLIN HFA) 108 (90 Base) MCG/ACT inhaler 4 puff (has no administration in time range)  amiodarone (PACERONE) tablet 200 mg (has no administration in time range)  insulin glargine (LANTUS) injection 10 Units (has no administration in time range)  levothyroxine (SYNTHROID) tablet 112 mcg (has no administration in time range)  bisacodyl (DULCOLAX) EC tablet 5 mg (has no administration in time range)  polyethylene glycol (MIRALAX / GLYCOLAX) packet 17 g (has no administration in time range)  sennosides-docusate sodium (SENOKOT-S) 8.6-50 MG tablet 2 tablet (has no administration in time range)  sodium chloride flush (NS) 0.9 % injection 3 mL (has no administration in time range)  sodium chloride flush (NS) 0.9 % injection 3 mL (has no  administration in time range)  0.9 %  sodium chloride infusion (has no administration in time range)  acetaminophen (TYLENOL) tablet 650 mg (has no administration in time range)  oxyCODONE (Oxy IR/ROXICODONE) immediate release tablet 5 mg (has no administration in time range)  zolpidem (AMBIEN) tablet 5 mg (has no administration in time range)  ondansetron (ZOFRAN) tablet 4 mg (has no administration in time range)    Or  ondansetron (ZOFRAN) injection 4 mg (has no administration in time range)  enoxaparin (LOVENOX) injection 30 mg (has no administration in time range)  sodium chloride flush (NS) 0.9 % injection 3 mL (has no administration in time range)  dexamethasone (DECADRON) injection 6 mg (has no administration in time range)  albuterol (VENTOLIN HFA) 108 (90 Base) MCG/ACT inhaler 4 puff (4 puffs Inhalation Given 11/20/2018 1555)  ipratropium (ATROVENT HFA) inhaler 2 puff (2 puffs Inhalation Given 11/14/2018 1557)  sodium chloride 0.9 % bolus 500 mL (500 mLs Intravenous New Bag/Given  11/15/2018 1603)    Mobility non-ambulatory High fall risk   Focused Assessments Pulmonary Assessment Handoff:  Lung sounds:            R Recommendations: See Admitting Provider Note  Report given to:   Additional Notes: DNR.

## 2018-12-10 NOTE — ED Provider Notes (Addendum)
[facility] EMERGENCY DEPARTMENT Provider Note   CSN: [id number] Arrival date & time: 11/15/2018  1428     History   Chief Complaint Chief Complaint  Patient presents with   Shortness of Breath    HPI Sharon [name] is a 85 y.o. female.     Patient from [facility] via em[facility]. Patient had covid test 11/16 positive. In past 1-2 days, patient with increased sob, and low pulse ox. EMS indicates patients pulse ox in the 70s today, and that her initial bp was 88/54. Recent poor po intake. Patient is noted with non productive cough and fever. Patient very limited historian due to dementia and severe respiratory distress - level 5 caveat.   The history is provided by the patient and the EMS personnel. The history is limited by the condition of the patient.  Shortness of Breath Associated symptoms: cough and fever     Past Medical History:  Diagnosis Date   Adult failure to thrive    Anemia    Atrial fibrillation (HCC)    Cardiac arrest (HCC)    Cataract    CHF (congestive heart failure) (HCC)    Chronic kidney disease    Dementia (HCC)    Diabetes mellitus without complication ([place])    Edema    feet/ankles occas   Hip fracture (HCC)    Hyperlipidemia    Hypertension    Hypothyroid    Neuropathy    Osteomyelitis (HCC)    left first metatarsal   Peripheral vascular disease ([place])    Stroke [facility])     Patient Active Problem List   Diagnosis Date Noted   Hypotension 05/28/2018   Lymphedema 03/24/2018   Pressure injury of skin 03/12/2018   Atherosclerosis of native arteries of the extremities with ulceration ([place]) 05/07/2017   Non-healing ulcer ([place]) 04/16/2017   Diabetic neuropathy ([place]) 04/07/2017   Septic shock ([place]) 03/20/2017   CKD (chronic kidney disease), stage III 03/19/2017   Goals of care, counseling/discussion 03/03/2017   PAD (peripheral artery disease) ([place]) 02/03/2017   Abdominal aortic stenosis 02/03/2017    Bilateral lower extremity edema 02/03/2017   Anemia 01/29/2017   B12 deficiency 01/29/2017   Pancreatic mass 01/29/2017   Hyponatremia 07/07/2015   Foot ulcer ([place]) 02/07/2015   Acute on chronic renal failure ([place]) 11/05/2014   Diabetic foot infection ([place]) 10/10/2014   Chronic diastolic heart failure ([place]) 05/31/2014   HTN (hypertension) 05/31/2014   DM (diabetes mellitus), type 2, uncontrolled ([place]) 05/31/2014    Past Surgical History:  Procedure Laterality Date   ACHILLES TENDON SURGERY Left 02/08/2015   Procedure: ACHILLES LENGTHENING/KIDNER;  Surgeon: Albertine Patricia, DPM;  Location: [facility] ORS;  Service: Podiatry;  Laterality: Left;   APPENDECTOMY     CARDIAC CATHETERIZATION  08/25/13   CATARACT EXTRACTION W/PHACO Left 07/20/2014   Procedure: CATARACT EXTRACTION PHACO AND INTRAOCULAR LENS PLACEMENT (IOC);  Surgeon: Leandrew Koyanagi, MD;  Location: [facility] ORS;  Service: Ophthalmology;  Laterality: Left;  [place]  1:18                 AP     23.6             CDE   9.69      lot #[phone]   CHOLECYSTECTOMY     CORONARY ANGIOPLASTY     HALLUX VALGUS AKIN Left 02/08/2015   Procedure: HALLUX VALGUS AKIN/ KELLER;  Surgeon: Albertine Patricia, DPM;  Location: [facility] ORS;  Service: Podiatry;  Laterality: Left;  IVA with Local needs 1 hour for this case    HEMIARTHROPLASTY HIP Right    HEMIARTHROPLASTY HIP Left    INCISION AND DRAINAGE Left 10/11/2014   Procedure: Removal of infected tibial sessmoid;  Surgeon: Albertine Patricia, DPM;  Location: [facility] ORS;  Service: Podiatry;  Laterality: Left;   IRRIGATION AND DEBRIDEMENT FOOT Right 03/21/2017   Procedure: IRRIGATION AND DEBRIDEMENT FOOT right great toe amputation;  Surgeon: Sharlotte Alamo, DPM;  Location: [facility] ORS;  Service: Podiatry;  Laterality: Right;   IRRIGATION AND DEBRIDEMENT FOOT Right 04/18/2017   Procedure: IRRIGATION AND DEBRIDEMENT FOOT and application wound vac;  Surgeon: Albertine Patricia, DPM;  Location: [facility] ORS;  Service:  Podiatry;  Laterality: Right;   KYPHOPLASTY N/A 06/03/2018   Procedure: KYPHOPLASTY L1;  Surgeon: Hessie Knows, MD;  Location: [facility] ORS;  Service: Orthopedics;  Laterality: N/A;   PERIPHERAL VASCULAR BALLOON ANGIOPLASTY Right 04/17/2017   Procedure: PERIPHERAL VASCULAR BALLOON ANGIOPLASTY;  Surgeon: Katha Cabal, MD;  Location: [facility];  Service: Cardiovascular;  Laterality: Right;     OB History   No obstetric history on file.      Home Medications    Prior to Admission medications   Medication Sig Start Date End Date Taking? Authorizing Provider  acetaminophen (TYLENOL) 500 MG tablet Take 1,000 mg by mouth at bedtime. For pain management    [provider]  acetaminophen (TYLENOL) 650 MG CR tablet Take 650 mg by mouth every 8 (eight) hours as needed. For discomfort    [provider]  albuterol (PROVENTIL) (2.5 MG/3ML) 0.083% nebulizer solution Take 3 mLs (2.5 mg total) by nebulization every 6 (six) hours as needed for wheezing or shortness of breath. 06/03/18   Gouru, Aruna, MD  Amino Acids-Protein Hydrolys (FEEDING SUPPLEMENT, PRO-STAT SUGAR FREE 64,) LIQD Take 30 mLs by mouth 2 (two) times daily between meals.    [provider]  amiodarone (PACERONE) 200 MG tablet Take 1 tablet (200 mg total) by mouth daily. 03/24/17   Gladstone Lighter, MD  atorvastatin (LIPITOR) 10 MG tablet Take 10 mg by mouth daily at 10 pm.     [provider]  bisacodyl (DULCOLAX) 5 MG EC tablet Take 5 mg by mouth daily. 04/26/18   [provider]  cholecalciferol (VITAMIN D) 1000 units tablet Take 1,000 Units by mouth daily.    [provider]  collagenase (SANTYL) ointment Apply topically daily. 03/12/18   Stark Jock Jude, MD  docusate sodium (COLACE) 100 MG capsule Take 1 capsule (100 mg total) by mouth 2 (two) times daily as needed for mild constipation. 06/03/18   Gouru, Illene Silver, MD  ferrous sulfate 325 (65 FE) MG tablet Take 325 mg by mouth  daily.     [provider]  fluconazole (DIFLUCAN) 150 MG tablet Take 1 tablet (150 mg total) by mouth daily. 06/03/18   Gouru, Illene Silver, MD  furosemide (LASIX) 20 MG tablet Take 20 mg by mouth daily.  03/15/18   [provider]  gabapentin (NEURONTIN) 300 MG capsule Take 300 mg by mouth 3 (three) times daily. 04/24/18   [provider]  GLUCERNA (GLUCERNA) LIQD Take 237 mLs by mouth 2 (two) times daily between meals.    [provider]  HYDROcodone-acetaminophen (NORCO/VICODIN) 5-325 MG tablet Take 1 tablet by mouth 3 (three) times daily. 06/03/18   Gouru, Illene Silver, MD  insulin aspart (NOVOLOG) 100 UNIT/ML injection Inject 4-8 Units into the skin 3 (three) times daily. 04/24/18   [provider]  insulin glargine (LANTUS) 100 UNIT/ML injection Inject 15 Units into the skin at bedtime. 04/24/18 04/24/19  [provider]  JANUVIA 50 MG tablet Take 50 mg by mouth daily. Take along with Amaryl 02/15/18   [provider]  levothyroxine (SYNTHROID, LEVOTHROID) 112 MCG tablet Take 112 mcg by mouth daily before breakfast. 30 minutes before breakfast    [provider]  linaclotide (LINZESS) 145 MCG CAPS capsule Take 145 mcg by mouth daily. 05/11/18   [provider]  metoprolol tartrate (LOPRESSOR) 25 MG tablet Take 1 tablet (25 mg total) by mouth 2 (two) times daily. 04/21/17   Saundra Shelling, MD  Multiple Vitamin (MULTIVITAMIN WITH MINERALS) TABS tablet Take 1 tablet by mouth daily. 06/03/18   Nicholes Mango, MD  mupirocin ointment (BACTROBAN) 2 % Place 1 application into the nose 2 (two) times daily.    [provider]  ondansetron (ZOFRAN) 4 MG tablet Take 1 tablet (4 mg total) by mouth every 6 (six) hours as needed for nausea. 06/03/18   Nicholes Mango, MD  oxyCODONE-acetaminophen (PERCOCET/ROXICET) 5-325 MG tablet Take 1 tablet by mouth every 6 (six) hours as needed for severe pain. 06/03/18   Gouru, Illene Silver, MD  polyethylene glycol  (MIRALAX / GLYCOLAX) 17 g packet Take 17 g by mouth 2 (two) times daily. 10/09/18   Drenda Freeze, MD  potassium chloride SA (K-DUR) 20 MEQ tablet Take 20 mEq by mouth daily. 05/03/18   [provider]  simethicone (MYLICON) 80 MG chewable tablet Chew 80 mg by mouth every 6 (six) hours. 05/11/18   [provider]  traMADol (ULTRAM) 50 MG tablet Take 1 tablet (50 mg total) by mouth every 6 (six) hours as needed for moderate pain. 06/03/18   Nicholes Mango, MD  traZODone (DESYREL) 50 MG tablet Take 50 mg by mouth at bedtime. For anxiety 06/24/17 06/24/18  [provider]  vitamin C (VITAMIN C) 250 MG tablet Take 1 tablet (250 mg total) by mouth 2 (two) times daily. 06/03/18   Nicholes Mango, MD    Family History Family History  Problem Relation Age of Onset   Heart attack Mother     Social History Social History   Tobacco Use   Smoking status: Former Smoker    Packs/day: 0.50    Years: 7.00    Pack years: 3.50    Types: Cigarettes    Quit date: 05/30/1989    Years since quitting: 29.5   Smokeless tobacco: Never Used  Substance Use Topics   Alcohol use: No    Alcohol/week: 0.0 standard drinks   Drug use: No     Allergies   Carbamazepine, Cymbalta [duloxetine hcl], Duloxetine, Lyrica [pregabalin], and Temazepam   Review of Systems Review of Systems  Unable to perform ROS: Dementia  Constitutional: Positive for fever.  Respiratory: Positive for cough and shortness of breath.   level 5 caveat - severe respiratory distress and dementia.    Physical Exam Updated Vital Signs BP 110/69 (BP Location: Right Arm)    Pulse 74    Temp 99.2 F (37.3 C) (Rectal)    Resp 14   Physical Exam Vitals signs and nursing note reviewed.  Constitutional:      Appearance: She is well-developed.     Comments: Thin, frail appearing, in resp distress.   HENT:     Head: Atraumatic.     Nose: Nose normal.     Mouth/Throat:     Mouth: Mucous membranes are moist.  Eyes:     General: No scleral icterus.    Conjunctiva/sclera: Conjunctivae normal.  Neck:     Musculoskeletal: Normal range of motion and neck supple. No neck rigidity or muscular tenderness.     Trachea: No tracheal deviation.  Cardiovascular:     Rate and Rhythm: Normal rate and regular rhythm.     Pulses: Normal pulses.     Heart sounds: Normal heart sounds. No murmur. No friction rub. No gallop.   Pulmonary:     Effort: Pulmonary effort is normal. No respiratory distress.     Breath sounds: Rhonchi present.  Abdominal:     General: Bowel sounds are normal. There is no distension.     Palpations: Abdomen is soft.     Tenderness: There is no abdominal tenderness. There is no guarding.  Genitourinary:    Comments: No cva tenderness.  Musculoskeletal:        General: No swelling or tenderness.     Right lower leg: No edema.     Left lower leg: No edema.  Skin:    General: Skin is warm and dry.     Findings: No rash.  Neurological:     Comments: Awake and alert. Moves bil extremities purposefully.   Psychiatric:        Mood and Affect: Mood normal.      ED Treatments / Results  Labs (all labs ordered are listed, but only abnormal results are displayed) Results for orders placed or performed during the hospital encounter of 12/08/2018  CBC WITH DIFFERENTIAL  Result Value Ref Range   WBC 12.4 (H) 4.0 - 10.5 K/uL   RBC 4.34 3.87 - 5.11 MIL/uL   Hemoglobin 11.9 (L) 12.0 - 15.0 g/dL   HCT 38.2 36.0 - 46.0 %   MCV 88.0 80.0 - 100.0 fL   MCH 27.4 26.0 - 34.0 pg   MCHC 31.2 30.0 - 36.0 g/dL   RDW 14.2 11.5 - 15.5 %   Platelets 209 150 - 400 K/uL   nRBC 0.0 0.0 - 0.2 %   Neutrophils Relative % 89 %   Neutro Abs 11.0 (H) 1.7 - 7.7 K/uL   Lymphocytes Relative 5 %   Lymphs Abs 0.7 0.7 - 4.0 K/uL   Monocytes Relative 4 %   Monocytes Absolute 0.5 0.1 - 1.0 K/uL   Eosinophils Relative 0 %   Eosinophils Absolute 0.0 0.0 - 0.5 K/uL   Basophils Relative 0 %   Basophils  Absolute 0.0 0.0 - 0.1 K/uL   Immature Granulocytes 2 %   Abs Immature Granulocytes 0.18 (H) 0.00 - 0.07 K/uL  Comprehensive metabolic panel  Result Value Ref Range   Sodium 134 (L) 135 - 145 mmol/L   Potassium 5.2 (H) 3.5 - 5.1 mmol/L   Chloride 105 98 - 111 mmol/L   CO2 17 (L) 22 - 32 mmol/L   Glucose, Bld 160 (H) 70 - 99 mg/dL   BUN 40 (H) 8 - 23 mg/dL   Creatinine, Ser 1.95 (H) 0.44 - 1.00 mg/dL   Calcium 7.3 (L) 8.9 - 10.3 mg/dL   Total Protein 5.5 (L) 6.5 - 8.1 g/dL   Albumin 2.0 (L) 3.5 - 5.0 g/dL   AST 98 (H) 15 - 41 U/L   ALT 55 (H) 0 - 44 U/L   Alkaline Phosphatase 79 38 - 126 U/L   Total Bilirubin 0.3 0.3 - 1.2 mg/dL   GFR calc non Af Amer 23 (L) >60 mL/min   GFR calc  Af Amer 27 (L) >60 mL/min   Anion gap 12 5 - 15  Lactate dehydrogenase  Result Value Ref Range   LDH 434 (H) 98 - 192 U/L  Triglycerides  Result Value Ref Range   Triglycerides 151 (H) <150 mg/dL  POC SARS Coronavirus 2 Ag-ED - Nasal Swab (BD Veritor Kit)  Result Value Ref Range   SARS Coronavirus 2 Ag POSITIVE (A) NEGATIVE   EKG EKG Interpretation  Date/Time:  Friday December 10 2018 14:47:35 EST Ventricular Rate:  75 PR Interval:    QRS Duration: 92 QT Interval:  424 QTC Calculation: 474 R Axis:   42 Text Interpretation: Sinus rhythm `no acute st/t changes Confirmed by Lajean Saver [phone]) on 12/13/2018 3:43:48 PM   Radiology Dg Chest Port 1 View  Result Date: 11/16/2018 CLINICAL DATA:  Shortness of breath EXAM: PORTABLE CHEST 1 VIEW COMPARISON:  04/21/2018 FINDINGS: Multifocal airspace opacities are noted. There is no pneumothorax. No large pleural effusion. The heart size is stable from prior study. There is no acute osseous abnormality. Again noted are findings of in L1 vertebral augmentation procedure. IMPRESSION: Multifocal airspace opacities concerning for multifocal pneumonia (viral or bacterial). Electronically Signed   By: Constance Holster M.D.   On: 11/25/2018 15:11     Procedures Procedures (including critical care time)  Medications Ordered in ED Medications - No data to display   Initial Impression / Assessment and Plan / ED Course  I have reviewed the triage vital signs and the nursing notes.  Pertinent labs & imaging results that were available during my care of the patient were reviewed by me and considered in my medical decision making (see chart for details).  Iv ns. Continuous pulse ox and monitor. o2 mask, 100% - sats 95%. Stat labs, cxr, ecg. Albuterol/atrovent breathing tx.   Lab draw by me - right femoral vein, sterile technique, prep w betadine/alc prep, 15 cc venous blood withdrawn, pressure held for 3 minutes, sterile dressing.   Reviewed nursing notes and prior charts for additional history. Positive covid test in patients paperwork from [facility] dated 11/16.    Shaquel Josephson Nordin was evaluated in Emergency Department on 12/09/2018 for the symptoms described in the history of present illness. She was evaluated in the context of the global COVID-19 pandemic, which necessitated consideration that the patient might be at risk for infection with the SARS-CoV-2 virus that causes COVID-19. Institutional protocols and algorithms that pertain to the evaluation of patients at risk for COVID-19 are in a state of rapid change based on information released by regulatory bodies including the CDC and federal and state organizations. These policies and algorithms were followed during the patient's care in the ED.  Additional hx from son, [name] - states at baseline has mild dementia with memory issues, but that she can carry on conversation. He states in past few days she seemed more confused when he would talk by phone, and questions whether due to covid or uti as pt w hx uti. ua is pending. Updated family on plan for admission once ED workup resulted. Son does indicate patient is full code.    Recheck pt, on 100% mask, sats 94%. No new c/o or new pain. Pt  content appearing.   Labs reviewed/interpreted by me - wbc mildly elev. Additional labs/ua pending.  CXR reviewed/interpreted by me - multifocal infiltrates c/w covid pna.   Additional labs reviewed/interpreted by me  - covid +. Chem with aki - pt appears dehydrated. Iv ns bolus.  Hospitalists consulted for admission.   CRITICAL CARE RE: acute respiratory failure due to covid infection/covid pna, AKI, dehydration.  Performed by: Mirna Mires Total critical care time: 95 minutes Critical care time was exclusive of separately billable procedures and treating other patients. Critical care was necessary to treat or prevent imminent or life-threatening deterioration. Critical care was time spent personally by me on the following activities: development of treatment plan with patient and/or surrogate as well as nursing, discussions with consultants, evaluation of patient's response to treatment, examination of patient, obtaining history from patient or surrogate, ordering and performing treatments and interventions, ordering and review of laboratory studies, ordering and review of radiographic studies, pulse oximetry and re-evaluation of patient's condition.  UA still pending - if positive, admitting team will need to add abx therapy.   Discussed with hospitalist, Dr Lorin Mercy - she will admit.  Final Clinical Impressions(s) / ED Diagnoses   Final diagnoses:  None    ED Discharge Orders    None            Lajean Saver, MD 12/01/2018 1622

## 2018-12-10 NOTE — ED Notes (Signed)
[facility] at facility for transport 

## 2018-12-10 NOTE — ED Triage Notes (Signed)
Pt is positive COVID from [facility] heart , pt had been fine and about 1 hour ago pt became sob and her sats dropped into the 70s on ra placed on NRB at 15 and sats have risen to 94 bp upon ems arrival 88/54 , lips were dusky and she was using accessory muscles, has 22 rt hand left arm restricted

## 2018-12-10 NOTE — H&P (Signed)
History and Physical    Sharon [name] VEH:[id number] DOB: August 28, 1933 DOA: 12/06/2018  PCP: Rusty Aus, MD Consultants:  Palliative care Patient coming from: [facility]; NOK: Bernarda Caffey [place], [phone]  Chief Complaint: SOB  HPI: Sharon [name] is a 85 y.o. female with medical history significant of CVA; PVD; hypothyroidism; HTN; HLD; DM; dementia; CKD; CHF; and afib presenting with worsening COVID-19-associated PNA.  She was previously diagnosed on 11/16 and has done well but dropped sats today into the 70s on RA.  Per EMS, BP 88/54 on arrival and lips were dusky with accessory muscle use.  She has had poor PO intake.  + cough, fever.  Patient is unable to provide further history.  I called and spoke with her son who is clearly distraught.  He reports that "she wants to live."  I clearly explained that resuscitation and artificial means is different from "wanting to live" but he was clear that he wants everything done. I mentioned our difficulty in keeping her NRB on - she removes it repeatedly, causing her sats to drop into the 50s - and he suggested restraining her.  He recognizes her overall poor prognosis and living condition.  Recent palliative care note (10/2) also indicates that he has chosen to override her wishes because "she is sometimes inconsistent" and so they opted to destroy the DNR forms that were signed so that the patient would remain full code.     ED Course:  COVID diagnosed on 11/16.  Increased SOB and new hypoxia.  AKI noted, likely associated with recent poor PO intake.  BP is ok.  On 6L [place] O2.  Review of Systems: Unable to effectively perform   Past Medical History:  Diagnosis Date  . Adult failure to thrive   . Anemia   . Atrial fibrillation ([place])   . Cardiac arrest ([place])   . Cataract   . CHF (congestive heart failure) ([place])   . Chronic kidney disease   . Dementia ([place])   . Diabetes mellitus without complication ([place])   . Edema    feet/ankles occas  . Hip fracture ([place])   . Hyperlipidemia   . Hypertension   . Hypothyroid   . Neuropathy   . Osteomyelitis ([place])    left first metatarsal  . Peripheral vascular disease ([place])   . Stroke [facility])     Past Surgical History:  Procedure Laterality Date  . ACHILLES TENDON SURGERY Left 02/08/2015   Procedure: ACHILLES LENGTHENING/KIDNER;  Surgeon: Albertine Patricia, DPM;  Location: [facility] ORS;  Service: Podiatry;  Laterality: Left;  . APPENDECTOMY    . CARDIAC CATHETERIZATION  08/25/13  . CATARACT EXTRACTION W/PHACO Left 07/20/2014   Procedure: CATARACT EXTRACTION PHACO AND INTRAOCULAR LENS PLACEMENT (IOC);  Surgeon: Leandrew Koyanagi, MD;  Location: [facility] ORS;  Service: Ophthalmology;  Laterality: Left;  [place]  1:18                 AP     23.6             CDE   9.69      lot #[phone]  . CHOLECYSTECTOMY    . CORONARY ANGIOPLASTY    . HALLUX VALGUS AKIN Left 02/08/2015   Procedure: HALLUX VALGUS AKIN/ KELLER;  Surgeon: Albertine Patricia, DPM;  Location: [facility] ORS;  Service: Podiatry;  Laterality: Left;  IVA with Local needs 1 hour for this case   . HEMIARTHROPLASTY HIP Right   . HEMIARTHROPLASTY HIP Left   .  INCISION AND DRAINAGE Left 10/11/2014   Procedure: Removal of infected tibial sessmoid;  Surgeon: Albertine Patricia, DPM;  Location: [facility] ORS;  Service: Podiatry;  Laterality: Left;  . IRRIGATION AND DEBRIDEMENT FOOT Right 03/21/2017   Procedure: IRRIGATION AND DEBRIDEMENT FOOT right great toe amputation;  Surgeon: Sharlotte Alamo, DPM;  Location: [facility] ORS;  Service: Podiatry;  Laterality: Right;  . IRRIGATION AND DEBRIDEMENT FOOT Right 04/18/2017   Procedure: IRRIGATION AND DEBRIDEMENT FOOT and application wound vac;  Surgeon: Albertine Patricia, DPM;  Location: [facility] ORS;  Service: Podiatry;  Laterality: Right;  . KYPHOPLASTY N/A 06/03/2018   Procedure: KYPHOPLASTY L1;  Surgeon: Hessie Knows, MD;  Location: [facility] ORS;  Service: Orthopedics;  Laterality: N/A;  . PERIPHERAL VASCULAR BALLOON  ANGIOPLASTY Right 04/17/2017   Procedure: PERIPHERAL VASCULAR BALLOON ANGIOPLASTY;  Surgeon: Katha Cabal, MD;  Location: [facility];  Service: Cardiovascular;  Laterality: Right;    Social History   Socioeconomic History  . Marital status: Widowed    Spouse name: Not on file  . Number of children: Not on file  . Years of education: Not on file  . Highest education level: Not on file  Occupational History  . Occupation: retired.  Social Needs  . Financial resource strain: Not hard at all  . Food insecurity    Worry: Not on file    Inability: Not on file  . Transportation needs    Medical: Not on file    Non-medical: Not on file  Tobacco Use  . Smoking status: Former Smoker    Packs/day: 0.50    Years: 7.00    Pack years: 3.50    Types: Cigarettes    Quit date: 05/30/1989    Years since quitting: 29.5  . Smokeless tobacco: Never Used  Substance and Sexual Activity  . Alcohol use: No    Alcohol/week: 0.0 standard drinks  . Drug use: No  . Sexual activity: Not Currently  Lifestyle  . Physical activity    Days per week: Not on file    Minutes per session: Not on file  . Stress: Not on file  Relationships  . Social Herbalist on phone: Not on file    Gets together: Not on file    Attends religious service: Not on file    Active member of club or organization: Not on file    Attends meetings of clubs or organizations: Not on file    Relationship status: Not on file  . Intimate partner violence    Fear of current or ex partner: Not on file    Emotionally abused: Not on file    Physically abused: Not on file    Forced sexual activity: Not on file  Other Topics Concern  . Not on file  Social History Narrative   Lives at home by herself.   Ambulates with a walker    Allergies  Allergen Reactions  . Carbamazepine Other (See Comments)    "Allergic," per MAR  . Cymbalta [Duloxetine Hcl] Other (See Comments)    Confusion, disorientation, and  "Allergic," per MAR  . Duloxetine Other (See Comments)    Confusion, disorientation, and "Allergic," per MAR  . Lyrica [Pregabalin] Other (See Comments)    Patient and son states this makes the patient confused ("Allergic," per [facility])  . Temazepam Other (See Comments)    Hallucinations and "Allergic," per [facility]    Family History  Problem Relation Age of Onset  . Heart attack Mother  Prior to Admission medications   Medication Sig Start Date End Date Taking? Authorizing Provider  albuterol (PROVENTIL) (2.5 MG/3ML) 0.083% nebulizer solution Take 3 mLs (2.5 mg total) by nebulization every 6 (six) hours as needed for wheezing or shortness of breath. 06/03/18  Yes Gouru, Aruna, MD  amiodarone (PACERONE) 200 MG tablet Take 1 tablet (200 mg total) by mouth daily. 03/24/17  Yes Gladstone Lighter, MD  azithromycin (ZITHROMAX) 500 MG tablet Take 500 mg by mouth daily. 12/04/18 12/14/18 Yes [provider]  bisacodyl (DULCOLAX) 5 MG EC tablet Take 5 mg by mouth daily. 04/26/18  Yes [provider]  Cholecalciferol 1.25 MG (50000 UT) capsule Take 50,000 Units by mouth every Monday.   Yes [provider]  ciprofloxacin (CIPRO) 500 MG tablet Take 500 mg by mouth every 12 (twelve) hours. 12/04/18 12/18/18 Yes [provider]  dexamethasone (DECADRON) 6 MG tablet Take 6 mg by mouth daily.   Yes [provider]  ferrous sulfate 325 (65 FE) MG tablet Take 325 mg by mouth daily.    Yes [provider]  gabapentin (NEURONTIN) 100 MG capsule Take 200 mg by mouth 3 (three) times daily.   Yes [provider]  GLUCERNA (GLUCERNA) LIQD Take 237 mLs by mouth 2 (two) times daily between meals.   Yes [provider]  Infant Care Products (DERMACLOUD) CREA Apply 1 application topically See admin instructions. Apply to buttocks and rectum two times a day- every day and night shift(s)   Yes [provider]  insulin glargine (LANTUS) 100 UNIT/ML  injection Inject 18 Units into the skin daily.  04/24/18 04/24/19 Yes [provider]  JANUVIA 50 MG tablet Take 50 mg by mouth daily.  02/15/18  Yes [provider]  levothyroxine (SYNTHROID, LEVOTHROID) 112 MCG tablet Take 112 mcg by mouth daily before breakfast. 30 minutes before breakfast   Yes [provider]  LORazepam (ATIVAN) 0.5 MG tablet Take 0.5 mg by mouth every 6 (six) hours as needed (for agitation).   Yes [provider]  mirtazapine (REMERON) 15 MG tablet Take 15 mg by mouth at bedtime.   Yes [provider]  morphine (MS CONTIN) 15 MG 12 hr tablet Take 15 mg by mouth at bedtime. 12/04/18 12/18/18 Yes [provider]  Multiple Vitamin (MULTIVITAMIN WITH MINERALS) TABS tablet Take 1 tablet by mouth daily. 06/03/18  Yes Gouru, Illene Silver, MD  nystatin cream (MYCOSTATIN) Apply 1 application topically See admin instructions. Apply to buttocks and rectum two times a day- every day and night shift(s)   Yes [provider]  oxyCODONE (OXY IR/ROXICODONE) 5 MG immediate release tablet Take 5 mg by mouth every 6 (six) hours as needed (for pain).   Yes [provider]  oxyCODONE-acetaminophen (PERCOCET) 7.5-325 MG tablet Take 1 tablet by mouth 2 (two) times daily.   Yes [provider]  polyethylene glycol (MIRALAX / GLYCOLAX) 17 g packet Take 17 g by mouth 2 (two) times daily. Patient taking differently: Take 17 g by mouth at bedtime.  10/09/18  Yes Drenda Freeze, MD  Probiotic CAPS Take 1 capsule by mouth 3 (three) times daily.    Yes [provider]  sennosides-docusate sodium (SENOKOT-S) 8.6-50 MG tablet Take 2 tablets by mouth every 12 (twelve) hours.   Yes [provider]  sulfamethoxazole-trimethoprim (BACTRIM DS) 800-160 MG tablet Take 1 tablet by mouth every 12 (twelve) hours. 12/03/18 12/17/18 Yes [provider]  traZODone (DESYREL) 50 MG tablet Take 50  mg by mouth at bedtime.  06/24/17  12/01/2018 Yes [provider]  triamcinolone cream (KENALOG) 0.1 % Apply 1 application topically See admin instructions. Apply to buttocks and rectum two times a day- every day and night shift(s)   Yes [provider]  acetaminophen (TYLENOL) 500 MG tablet Take 1,000 mg by mouth at bedtime. For pain management    [provider]  acetaminophen (TYLENOL) 650 MG CR tablet Take 650 mg by mouth every 8 (eight) hours as needed. For discomfort    [provider]  Amino Acids-Protein Hydrolys (FEEDING SUPPLEMENT, PRO-STAT SUGAR FREE 64,) LIQD Take 30 mLs by mouth 2 (two) times daily between meals.    [provider]  atorvastatin (LIPITOR) 10 MG tablet Take 10 mg by mouth daily at 10 pm.     [provider]  collagenase (SANTYL) ointment Apply topically daily. Patient not taking: Reported on 11/25/2018 03/12/18   Otila Back, MD  docusate sodium (COLACE) 100 MG capsule Take 1 capsule (100 mg total) by mouth 2 (two) times daily as needed for mild constipation. Patient not taking: Reported on 12/04/2018 06/03/18   Nicholes Mango, MD  fluconazole (DIFLUCAN) 150 MG tablet Take 1 tablet (150 mg total) by mouth daily. Patient not taking: Reported on 11/30/2018 06/03/18   Nicholes Mango, MD  furosemide (LASIX) 20 MG tablet Take 20 mg by mouth daily.  03/15/18   [provider]  HYDROcodone-acetaminophen (NORCO/VICODIN) 5-325 MG tablet Take 1 tablet by mouth 3 (three) times daily. Patient not taking: Reported on 11/21/2018 06/03/18   Nicholes Mango, MD  insulin aspart (NOVOLOG) 100 UNIT/ML injection Inject 4-8 Units into the skin 3 (three) times daily. 04/24/18   [provider]  linaclotide (LINZESS) 145 MCG CAPS capsule Take 145 mcg by mouth daily. 05/11/18   [provider]  metoprolol tartrate (LOPRESSOR) 25 MG tablet Take 1 tablet (25 mg total) by mouth 2 (two) times daily. Patient not taking: Reported on 12/03/2018 04/21/17   Saundra Shelling, MD   mupirocin ointment (BACTROBAN) 2 % Place 1 application into the nose 2 (two) times daily.    [provider]  ondansetron (ZOFRAN) 4 MG tablet Take 1 tablet (4 mg total) by mouth every 6 (six) hours as needed for nausea. Patient not taking: Reported on 11/20/2018 06/03/18   Nicholes Mango, MD  oxyCODONE-acetaminophen (PERCOCET/ROXICET) 5-325 MG tablet Take 1 tablet by mouth every 6 (six) hours as needed for severe pain. Patient not taking: Reported on 11/22/2018 06/03/18   Nicholes Mango, MD  potassium chloride SA (K-DUR) 20 MEQ tablet Take 20 mEq by mouth daily. 05/03/18   [provider]  simethicone (MYLICON) 80 MG chewable tablet Chew 80 mg by mouth every 6 (six) hours. 05/11/18   [provider]  traMADol (ULTRAM) 50 MG tablet Take 1 tablet (50 mg total) by mouth every 6 (six) hours as needed for moderate pain. Patient not taking: Reported on 12/05/2018 06/03/18   Nicholes Mango, MD  vitamin C (VITAMIN C) 250 MG tablet Take 1 tablet (250 mg total) by mouth 2 (two) times daily. Patient not taking: Reported on 12/02/2018 06/03/18   Nicholes Mango, MD    Physical Exam: Vitals:   11/15/2018 1700 11/24/2018 1701 12/01/2018 1704 11/26/2018 1715  BP:  132/63  138/71  Pulse: 84 85 82 82  Resp: (!) 27 19 18 17   Temp:      TempSrc:      SpO2: 94% 95% 96% 94%     .  General:  Appears agitated, delirious, continuously removing NRB with resultant marked desaturations into the 50s; when I stood next to her after wrapping her tightly in blankets and held her shoulder, she calmed and her sats improved to 95%.  As soon as I moved away, she again began removing the mask.  . Eyes:  PERRL, EOMI, normal lids, iris . ENT:  grossly normal hearing, lips & tongue, mildly dry mm . Neck:  no LAD, masses or thyromegaly . Cardiovascular:  RRR, no m/r/g. No LE edema.  Marland Kitchen Respiratory:   Diffuse rhonchi.  Increased respiratory effort. . Abdomen:  soft, NT, ND, NABS . Skin:  no rash or induration seen on  limited exam . Musculoskeletal:  grossly normal tone BUE/BLE, good ROM, no bony abnormality . Psychiatric:  Delirious mood and affect, speech fluent but inappropriate, AOx2, agitated . Neurologic:  Unable to perform    Radiological Exams on Admission: Dg Chest Port 1 View  Result Date: 11/20/2018 CLINICAL DATA:  Shortness of breath EXAM: PORTABLE CHEST 1 VIEW COMPARISON:  04/21/2018 FINDINGS: Multifocal airspace opacities are noted. There is no pneumothorax. No large pleural effusion. The heart size is stable from prior study. There is no acute osseous abnormality. Again noted are findings of in L1 vertebral augmentation procedure. IMPRESSION: Multifocal airspace opacities concerning for multifocal pneumonia (viral or bacterial). Electronically Signed   By: Constance Holster M.D.   On: 11/24/2018 15:11    EKG: Independently reviewed.  NSR with rate 75; nonspecific ST changes with no evidence of acute ischemia   Labs on Admission: I have personally reviewed the available labs and imaging studies at the time of the admission.  Pertinent labs:   Na++ 134 K+ 5.2 CO2 17 Glucose 160 BUN 40/Creatinine 1.95/GFR 23 Albumin 2.0 AST 98/ALT 55 BNP 816.8 LDH 434 Procalcitonin 0.95 WBC 12.4 Hgb 11.9 D-dimer 2.25 Ferritin 599 CRP 15.2 Lactate 2.5 Fibrinogen 610 COVID POSITIVE   Assessment/Plan Principal Problem:   Acute respiratory disease due to COVID-19 virus Active Problems:   Chronic diastolic heart failure (HCC)   HTN (hypertension)   DM (diabetes mellitus), type 2, uncontrolled (HCC)   Acute kidney injury superimposed on CKD (HCC)   Goals of care, counseling/discussion   Dementia with behavioral disturbance ([place])   Acquired hypothyroidism   Acute respiratory failure with hypoxia -Patient with COVID + test on 11/16 presenting with worsening SOB and hypoxia to the 50s -She does not have a usual home O2 requirement and is currently requiring NRB O2; when she wears it, her  oxygen saturations are appropriate, but her delirium is causing her to repeatedly remove the mask with subsequent recurrent bouts of hypoxia.  She is being given Ativan to try to relieve her agitation (takes this at home) and we are placing mittens in an effort to avoid physical restraints. -COVID POSITIVE -The patient has comorbidities which may increase the risk for ARDS/MODS including: age, HTN, DM -Exam is concerning for development of ARDS/MODS due to respiratory distress -Pertinent labs concerning for COVID include increased BUN/Creatinine; increased LFTs; markedly elevated D-dimer (>1); increased LDH; increased fibrinogen -CXR with multifocal opacities which may be c/w COVID vs. Multifocal PNA -Will treat with broad-spectrum antibiotics given procalcitonin >0.1.  Of note, it appears that she has been receiving Cipro, Azithro, and Decadron at her facility as well.   -Will admit to [facility] progressive care unit for further evaluation, close monitoring, and treatment -Monitor on telemetry x at least 24 hours -At this time, will attempt to avoid  use of aerosolized medications and use HFAs instead -Will check daily labs including BMP with Mag, Phos; LFTs; CBC with differential; CRP; ferritin; fibrinogen; D-dimer -Will order steroids (1 mg/kg divided BID) and Remdesivir (pharmacy consult) given +COVID test, +CXR, and hypoxia <94% on room air -If the patient shows clinical deterioration, consider transfer to ICU with PCCM consultation -If the patient is hypoxic or on >3L oxygen from baseline or CRP >7, considerTocilizumab 8 mg/kg x1 IF + COVID test; O2 sats <88% on room air; and patient is at high risk for intubation.  Will defer to [facility] doctors regarding this medication since it is being used off label. -Will attempt to maintain euvolemia to a net negative fluid status -Will ask the patient to maintain an awake prone position for 16+ hours a day, if possible, with a minimum of 2-3 hours at a time -With  D-dimer <5, will use standard-dosed Lovenox for DVT prevention -Patient was seen wearing full PPE including: gown, gloves, head cover, N95, and face shield; donning and doffing was in compliance with current standards.  AKI on CKD -Baseline creatinine 1.20 on 9/26 with GFR 42, c/w stage 3b CKD -Currently with creatinine 1.95, GFR 23 -She received a 500 cc bolus but does appear still mildly dry -Will give an additional 100 cc/hr x 10 hours for a total of 1 additional liter, particularly since she is NPO at this time  Afib -Currently in NSR -Rate control with Amiodarone  DM -Last A1c was 7.1 -Continue Lantus but decrease from 18 to 10 units due to NPO -Hold Jardiance -Will cover with sensitive-scale SSI  Chronic pain -She is on significant opiate therapy at her facility - Vicodin TID, MS Contin qhs, Oxy IR prn, and Percocet BID are all listed -Will give medications per the COVID order set as well as low-dose q4h standing morphine for now   HTN -She does not appear to be taking any medications for this issue at this time, and her BP is well controlled currently  Hypothyroidism -Continue Synthroid  Chronic diastolic CHF -Normal echo on 2/25 -Appears dry at this time, hydration as above  Dementia -Patient is not oriented enough at this time time answer questions for herself -It appears that she was during her prior palliative care encounter, but that paperwork was destroyed -Will continue Ativan, transition to IV -Takes Remeron and Trazodone, holding for now  Goals of care discussion -I discussed this patient at length with her son -He does not appear to have good understanding of the severity of her condition -Her prognosis at this time is poor and she is at high risk for in-hospital death -At this time he requests full code and whatever it takes to keep her alive     DVT prophylaxis:  Lovenox  Code Status:  Full - confirmed with patient Family Communication: None  present; I spoke with the patient's son by telephone. Disposition Plan:  SNF if not In-hospital death Consults called: None  Admission status: Admit - It is my clinical opinion that admission to INPATIENT is reasonable and necessary because of the expectation that this patient will require hospital care that crosses at least 2 midnights to treat this condition based on the medical complexity of the problems presented.  Given the aforementioned information, the predictability of an adverse outcome is felt to be significant.     Karmen Bongo MD [facility]   How to contact the [facility] Attending or Consulting provider [place] or covering provider during after  hours 7P -7A, for this patient?  1. Check the care team in [facility] and look for a) attending/consulting TRH provider listed and b) the [facility] team listed 2. Log into [email or web address] and use [place]'s universal password to access. If you do not have the password, please contact the hospital operator. 3. Locate the [facility] provider you are looking for under [facility] and page to a number that you can be directly reached. 4. If you still have difficulty reaching the provider, please page the [facility] (Director on Call) for the Hospitalists listed on amion for assistance.   12/02/2018, 5:31 PM

## 2018-12-10 NOTE — ED Notes (Signed)
Pt continuously taking off the NRB. Pt states her mouth is too dry. MD notified of situation

## 2018-12-11 ENCOUNTER — Inpatient Hospital Stay (HOSPITAL_COMMUNITY): Payer: PPO

## 2018-12-11 DIAGNOSIS — U071 COVID-19: Secondary | ICD-10-CM | POA: Diagnosis not present

## 2018-12-11 DIAGNOSIS — N179 Acute kidney failure, unspecified: Secondary | ICD-10-CM | POA: Diagnosis not present

## 2018-12-11 DIAGNOSIS — I5032 Chronic diastolic (congestive) heart failure: Secondary | ICD-10-CM | POA: Diagnosis not present

## 2018-12-11 DIAGNOSIS — N189 Chronic kidney disease, unspecified: Secondary | ICD-10-CM

## 2018-12-11 DIAGNOSIS — E039 Hypothyroidism, unspecified: Secondary | ICD-10-CM | POA: Diagnosis not present

## 2018-12-11 DIAGNOSIS — F0391 Unspecified dementia with behavioral disturbance: Secondary | ICD-10-CM

## 2018-12-11 DIAGNOSIS — I10 Essential (primary) hypertension: Secondary | ICD-10-CM

## 2018-12-11 LAB — COMPREHENSIVE METABOLIC PANEL
ALT: 71 U/L — ABNORMAL HIGH (ref 0–44)
AST: 130 U/L — ABNORMAL HIGH (ref 15–41)
Albumin: 2 g/dL — ABNORMAL LOW (ref 3.5–5.0)
Alkaline Phosphatase: 79 U/L (ref 38–126)
Anion gap: 9 (ref 5–15)
BUN: 33 mg/dL — ABNORMAL HIGH (ref 8–23)
CO2: 18 mmol/L — ABNORMAL LOW (ref 22–32)
Calcium: 6.4 mg/dL — CL (ref 8.9–10.3)
Chloride: 112 mmol/L — ABNORMAL HIGH (ref 98–111)
Creatinine, Ser: 1.5 mg/dL — ABNORMAL HIGH (ref 0.44–1.00)
GFR calc Af Amer: 37 mL/min — ABNORMAL LOW (ref >60)
GFR calc non Af Amer: 32 mL/min — ABNORMAL LOW (ref >60)
Glucose, Bld: 79 mg/dL (ref 70–99)
Potassium: 3.9 mmol/L (ref 3.5–5.1)
Sodium: 139 mmol/L (ref 135–145)
Total Bilirubin: 0.2 mg/dL — ABNORMAL LOW (ref 0.3–1.2)
Total Protein: 4.7 g/dL — ABNORMAL LOW (ref 6.5–8.1)

## 2018-12-11 LAB — CBC WITH DIFFERENTIAL/PLATELET
Abs Immature Granulocytes: 0.08 10*3/uL — ABNORMAL HIGH (ref 0.00–0.07)
Basophils Absolute: 0 10*3/uL (ref 0.0–0.1)
Basophils Relative: 0 %
Eosinophils Absolute: 0 10*3/uL (ref 0.0–0.5)
Eosinophils Relative: 0 %
HCT: 26.6 % — ABNORMAL LOW (ref 36.0–46.0)
Hemoglobin: 8.2 g/dL — ABNORMAL LOW (ref 12.0–15.0)
Immature Granulocytes: 1 %
Lymphocytes Relative: 7 %
Lymphs Abs: 0.7 10*3/uL (ref 0.7–4.0)
MCH: 28.3 pg (ref 26.0–34.0)
MCHC: 30.8 g/dL (ref 30.0–36.0)
MCV: 91.7 fL (ref 80.0–100.0)
Monocytes Absolute: 0.3 10*3/uL (ref 0.1–1.0)
Monocytes Relative: 3 %
Neutro Abs: 9.1 10*3/uL — ABNORMAL HIGH (ref 1.7–7.7)
Neutrophils Relative %: 89 %
Platelets: 138 10*3/uL — ABNORMAL LOW (ref 150–400)
RBC: 2.9 MIL/uL — ABNORMAL LOW (ref 3.87–5.11)
RDW: 14.4 % (ref 11.5–15.5)
WBC: 10.2 10*3/uL (ref 4.0–10.5)
nRBC: 0.3 % — ABNORMAL HIGH (ref 0.0–0.2)

## 2018-12-11 LAB — GLUCOSE, CAPILLARY
Glucose-Capillary: 145 mg/dL — ABNORMAL HIGH (ref 70–99)
Glucose-Capillary: 55 mg/dL — ABNORMAL LOW (ref 70–99)
Glucose-Capillary: 59 mg/dL — ABNORMAL LOW (ref 70–99)
Glucose-Capillary: 134 mg/dL — ABNORMAL HIGH (ref 70–99)
Glucose-Capillary: 118 mg/dL — ABNORMAL HIGH (ref 70–99)
Glucose-Capillary: 177 mg/dL — ABNORMAL HIGH (ref 70–99)
Glucose-Capillary: 242 mg/dL — ABNORMAL HIGH (ref 70–99)

## 2018-12-11 LAB — MAGNESIUM: Magnesium: 1.8 mg/dL (ref 1.7–2.4)

## 2018-12-11 LAB — D-DIMER, QUANTITATIVE: D-Dimer, Quant: 4.63 ug/mL-FEU — ABNORMAL HIGH (ref 0.00–0.50)

## 2018-12-11 LAB — ABO/RH: ABO/RH(D): O POS

## 2018-12-11 LAB — FERRITIN: Ferritin: 537 ng/mL — ABNORMAL HIGH (ref 11–307)

## 2018-12-11 LAB — C-REACTIVE PROTEIN: CRP: 20 mg/dL — ABNORMAL HIGH (ref <1.0)

## 2018-12-11 LAB — PHOSPHORUS: Phosphorus: 2.6 mg/dL (ref 2.5–4.6)

## 2018-12-11 MED ORDER — TOCILIZUMAB 400 MG/20ML IV SOLN
400.0000 mg | Freq: Once | INTRAVENOUS | Status: AC
  Administered 2018-12-11: 400 mg via INTRAVENOUS
  Filled 2018-12-11: qty 20

## 2018-12-11 MED ORDER — HALOPERIDOL LACTATE 5 MG/ML IJ SOLN
1.0000 mg | INTRAMUSCULAR | Status: DC | PRN
  Administered 2018-12-11: 1 mg via INTRAMUSCULAR
  Filled 2018-12-11: qty 1

## 2018-12-11 MED ORDER — DEXTROSE 50 % IV SOLN
INTRAVENOUS | Status: AC
  Filled 2018-12-11: qty 50

## 2018-12-11 MED ORDER — LEVOTHYROXINE SODIUM 100 MCG/5ML IV SOLN
50.0000 ug | Freq: Every day | INTRAVENOUS | Status: DC
  Administered 2018-12-11: 50 ug via INTRAVENOUS
  Filled 2018-12-11: qty 5

## 2018-12-11 MED ORDER — SODIUM CHLORIDE 0.9% IV SOLUTION: Freq: Once | INTRAVENOUS | Status: DC

## 2018-12-11 MED ORDER — DEXTROSE 50 % IV SOLN
25.0000 mL | Freq: Once | INTRAVENOUS | Status: AC
  Administered 2018-12-11: 25 mL via INTRAVENOUS

## 2018-12-11 MED ORDER — CALCIUM GLUCONATE-NACL 1-0.675 GM/50ML-% IV SOLN
1.0000 g | Freq: Once | INTRAVENOUS | Status: AC
  Administered 2018-12-11: 1000 mg via INTRAVENOUS
  Filled 2018-12-11: qty 50

## 2018-12-11 MED ORDER — HALOPERIDOL 0.5 MG PO TABS
1.0000 mg | ORAL_TABLET | ORAL | Status: DC | PRN
  Filled 2018-12-11 (×2): qty 1

## 2018-12-11 MED ORDER — DEXTROSE-NACL 5-0.45 % IV SOLN
INTRAVENOUS | Status: DC
  Administered 2018-12-11: 12:00:00 via INTRAVENOUS

## 2018-12-11 MED ORDER — DEXTROSE 50 % IV SOLN
12.5000 g | INTRAVENOUS | Status: AC
  Administered 2018-12-11: 12.5 g via INTRAVENOUS

## 2018-12-12 NOTE — Progress Notes (Signed)
At 2252, the covering nurse Odis Hollingshead) received call from [place] that patients leads and o2 sensor were off. The nurse immediately went to the room to find that the patient had expired. I was notified and quickly returned from break, on-call physician was notified to pronounce, body was cleaned, iv site in right hand removed, green watch left in place on left wrist (family made aware), and family was notified by Dr. Josephine Cables and they also spoke with the charge nurse, Maudie Mercury. They expressed gratitude for the care and information regarding the funeral home was obtained, [facility] was called as well. Body was placed in bag with toe tag attached and sticker on the bag, was placed on stretcher and will be taken to the morgue momentarily.

## 2018-12-12 NOTE — Progress Notes (Signed)
I was called by nurse to pronounce death on patient due to unresponsiveness.  At bedside, patient did not respond to verbal or physical stimuli.  Heart and breath sounds were absent, peripheral pulses were absent.  Pupils were fixed and dilated.  Patient was pronounced dead at 2253 hrs. Patient's son [name] [place]) was notified and he also spoke with the charge nurse, Maudie Mercury.  Death certificate was filled.  Death summary per attending physician pending.

## 2018-12-13 LAB — BPAM FFP
Blood Product Expiration Date: 202011291644
ISSUE DATE / TIME: 202011281716
Unit Type and Rh: 9500

## 2018-12-13 LAB — PREPARE FRESH FROZEN PLASMA: Unit division: 0

## 2018-12-14 LAB — URINE CULTURE

## 2018-12-14 NOTE — Progress Notes (Addendum)
PROGRESS NOTE    Sharon [name]  XBM:[id number] DOB: Nov 18, 1933 DOA: 11/23/2018 PCP: Rusty Aus, MD    Brief Narrative:  85 year old female who presented with dyspnea.  She does have significant past medical history of CVA, peripheral vascular disease, hypothyroidism, hypertension, dyslipidemia, type 2 diabetes mellitus, chronic kidney disease, heart failure, atrial fibrillation and dementia.  Patient was diagnosed November 16 with SARS COVID-19 viral pneumonia, she had fever, cough and poor oral intake. On November 27 she developed hypoxemia, with oxygen saturation down to the 70s on room air.  When EMS arrived her blood pressure was 88/54 and she was noted to have increased work of breathing.  On her initial physical examination her blood pressure was 132/63, pulse rate 85, respiratory 27, oxygen saturation 94% on 6 L/min per nasal cannula and intermittent use of nonrebreather mask.  Patient was combative and agitated, her lungs had diffuse rhonchi, heart S1-S2 present rhythmic, soft abdomen, no lower extremity edema. Sodium 134, potassium 5.2, chloride 105, bicarb 17, glucose 160, BUN 40, creatinine 1.95, AST 98, ALT 55, white count 12.4, hemoglobin 11.9, hematocrit 38.2, platelets 209.  SARS COVID-19 was positive.  Urinalysis had more than 50 white cells, more than 300 protein, specific gravity 1.023.  Her chest radiograph had diffuse, bilateral, reticulonodular patchy infiltrates.  EKG 75 bpm, normal axis, normal intervals, sinus rhythm, no ST segment or T wave changes, poor R wave progression.  Patient was admitted to the hospital with a working diagnosis of acute hypoxic respiratory failure due to SARS COVID-19 viral pneumonia.   Assessment & Plan:   Principal Problem:   Acute respiratory disease due to COVID-19 virus Active Problems:   Chronic diastolic heart failure (HCC)   HTN (hypertension)   DM (diabetes mellitus), type 2, uncontrolled (HCC)   Acute kidney injury  superimposed on CKD (HCC)   Goals of care, counseling/discussion   Dementia with behavioral disturbance ([place])   Acquired hypothyroidism   1. Acute hypoxic respiratory failure due to SARS COVID 19 viral pneumonia.   RR: 30  Pulse oxymetry: 86 to 90  Fi02: 30 LPM HFNC + NRBM  COVID-19 Labs  Recent Labs    11/23/2018 1445 12/06/2018 1533 December 27, 2018 0408  DDIMER  --  2.25*  --   FERRITIN  --  599* 537*  LDH 434*  --   --   CRP  --  15.2* 20.0*    Lab Results  Component Value Date   SARSCOV2NAA NOT DETECTED 07/28/2018   [place] NEGATIVE 06/03/2018   [place] NEGATIVE 05/28/2018    Patient continue to be very confused and combative, still pulling NRB mask and HFNC, with significant oxygen desaturation. Chest film personally reviewed with worsening diffuse bilateral reticulonodular infiltrates.   Elevated inflammatory markers, severe hypoxic respiratory failure, AKI and multiple medical problems, poor prognosis.   Patient is high risk for worsening respiratory failure. I spoke with her son over the phone, consented for Convalescent plasma and Actemra.  Continue systemic corticosteroids therapy with dexamethasone and continue antiviral therapy with Remdesivir #2/5. Antitussive agents as needed. Poor cooperation for prone or airway clearing techniques. Continue to follow inflammatory markers.   Currnetly with antibiotic (ceftriaxone and azithromycin)  therapy for suspected bacterial pneumonia coinfection, likely severe viral pneumonia, but because patient critically ill will continue antibiotic therapy for now, with the intention of rapid de-escalation depending on patient's condition in the next 24 H.    2. Metabolic encephalopathy in the setting of dementia. Patient with worsening encephalopathy, combative. Will  continue restrains for now and will add low dose haldol as needed. Will keep her NPO with aspiration precautions.   3. AKI on CKD stage 3b with non gap metabolic  acidosis and hypomagnesemia/ hypocalcemia. Renal function with serum cr at 1.50 with K at 3,9 and serum bicarbonate at 18, CL 112. Will continue hydration with slow reate of half normal saline at 50 ml per H. Add IV Mg/ CA and follow renal panel in am, avoid hypotension and nephrotoxic agents.   4. Paroxysmal atrial fibrillation with chronic diastolic heart failure. Patient is sinus rhythm, hypovolemic on exam, no signs of decompensated heart failure. Will continue telemetry, not on anticoagulation. Continue with amiodarone.   5. HTN. Continue to hold on antihypertensive medications to prevent hypotension.   6. T2DM with hypoglycemia Fating glucose this am 79, capillary 59. Will hold on insulin coverage for now and will add dextrose to IV fluids, continue glucose monitoring.   6. Hypothyroid. Will change to IV levothyroxine for now.   7.  Right heel pressure ulcer unstageable, present on admission, left heel ulcer, unstageable, present admission. Continue with local wound care.   DVT prophylaxis: enoxaparin   Code Status:  DNR  Family Communication: I spoke over the phone at the bedside with the patient's son  about patient's  condition, plan of care and all questions were addressed. Disposition Plan/ discharge barriers: Pending clinical status.   Patient is critically ill with high risk of worsening respiratory failure, will continue aggressive medical therapy to prevent further deterioration.  Critical care time 60 minutes.   Body mass index is 21.49 kg/m. Malnutrition Type:      Malnutrition Characteristics:      Nutrition Interventions:     RN Pressure Injury Documentation: Pressure Injury 04/16/17 Stage III -  Full thickness tissue loss. Subcutaneous fat may be visible but bone, tendon or muscle are NOT exposed. (Active)  04/16/17 1800  Location: Foot  Location Orientation: Left;Lower  Staging: Stage III -  Full thickness tissue loss. Subcutaneous fat may be visible but  bone, tendon or muscle are NOT exposed.  Wound Description (Comments):   Present on Admission:      Pressure Injury 11/27/2018 Sacrum Mid Stage I -  Intact skin with non-blanchable redness of a localized area usually over a bony prominence. (Active)  11/17/2018 2030  Location: Sacrum  Location Orientation: Mid  Staging: Stage I -  Intact skin with non-blanchable redness of a localized area usually over a bony prominence.  Wound Description (Comments):   Present on Admission: Yes     Pressure Injury 03/10/18 Deep Tissue Injury - Purple or maroon localized area of discolored intact skin or blood-filled blister due to damage of underlying soft tissue from pressure and/or shear. dark purple, lateral right heel, bottom of left heel (Active)  03/10/18 2056  Location: Heel  Location Orientation: Right;Left  Staging: Deep Tissue Injury - Purple or maroon localized area of discolored intact skin or blood-filled blister due to damage of underlying soft tissue from pressure and/or shear.  Wound Description (Comments): dark purple, lateral right heel, bottom of left heel  Present on Admission:      Pressure Injury 11/17/2018 Heel Right Unstageable - Full thickness tissue loss in which the base of the ulcer is covered by slough (yellow, tan, gray, green or brown) and/or eschar (tan, brown or black) in the wound bed. (Active)  11/15/2018 2030  Location: Heel  Location Orientation: Right  Staging: Unstageable - Full thickness tissue loss in  which the base of the ulcer is covered by slough (yellow, tan, gray, green or brown) and/or eschar (tan, brown or black) in the wound bed.  Wound Description (Comments):   Present on Admission: Yes     Pressure Injury 11/27/2018 Heel Left Unstageable - Full thickness tissue loss in which the base of the ulcer is covered by slough (yellow, tan, gray, green or brown) and/or eschar (tan, brown or black) in the wound bed. (Active)  11/14/2018 2030  Location: Heel  Location  Orientation: Left  Staging: Unstageable - Full thickness tissue loss in which the base of the ulcer is covered by slough (yellow, tan, gray, green or brown) and/or eschar (tan, brown or black) in the wound bed.  Wound Description (Comments):   Present on Admission: Yes     Consultants:     Procedures:     Antimicrobials:       Subjective: Patient continue to be combative, and agitated, pulling high flow nasal cannula and nrb mask. She is confused and disorientated.   Objective: Vitals:   01-03-2019 0615 2019/01/03 0635 01/03/19 0645 January 03, 2019 0651  BP:      Pulse: 100 99  96  Resp: 19 (!) 24  (!) 23  Temp:      TempSrc:      SpO2: (!) 79% (S) (!) 86% (!) 88% (S) 90%  Weight:        Intake/Output Summary (Last 24 hours) at 01/03/19 0828 Last data filed at Jan 03, 2019 0700 Gross per 24 hour  Intake 500 ml  Output 275 ml  Net 225 ml   Filed Weights   12/09/2018 2025  Weight: 56.8 kg    Examination:   General: positive respiratory distress, deconditioned and ill looking appearing.  Neurology: Awake and alert, non focal  E ENT: no pallor, no icterus, oral mucosa moist Cardiovascular: No JVD. S1-S2 present, rhythmic, no gallops, rubs, or murmurs. No lower extremity edema. Pulmonary: positive breath sounds bilaterally, bilateral rhonchi. Gastrointestinal. Abdomen with no organomegaly, non tender, no rebound or guarding Skin. No rashes Musculoskeletal: no joint deformities     Data Reviewed: I have personally reviewed following labs and imaging studies  CBC: Recent Labs  Lab 12/08/2018 1445 03-Jan-2019 0408  WBC 12.4* 10.2  NEUTROABS 11.0* 9.1*  HGB 11.9* 8.2*  HCT 38.2 26.6*  MCV 88.0 91.7  PLT 209 242*   Basic Metabolic Panel: Recent Labs  Lab 11/29/2018 1445  NA 134*  K 5.2*  CL 105  CO2 17*  GLUCOSE 160*  BUN 40*  CREATININE 1.95*  CALCIUM 7.3*   GFR: Estimated Creatinine Clearance: 18.9 mL/min (A) (by C-G formula based on SCr of 1.95 mg/dL  (H)). Liver Function Tests: Recent Labs  Lab 11/30/2018 1445  AST 98*  ALT 55*  ALKPHOS 79  BILITOT 0.3  PROT 5.5*  ALBUMIN 2.0*   No results for input(s): LIPASE, AMYLASE in the last 168 hours. No results for input(s): AMMONIA in the last 168 hours. Coagulation Profile: No results for input(s): INR, PROTIME in the last 168 hours. Cardiac Enzymes: No results for input(s): CKTOTAL, CKMB, CKMBINDEX, TROPONINI in the last 168 hours. BNP (last 3 results) No results for input(s): PROBNP in the last 8760 hours. HbA1C: No results for input(s): HGBA1C in the last 72 hours. CBG: Recent Labs  Lab 01-03-19 0153 2019/01/03 0219 01/03/2019 0812  GLUCAP 55* 145* 59*   Lipid Profile: Recent Labs    11/14/2018 1445  TRIG 151*   Thyroid Function Tests: No  results for input(s): TSH, T4TOTAL, FREET4, T3FREE, THYROIDAB in the last 72 hours. Anemia Panel: Recent Labs    12/08/2018 1533 01/03/2019 0408  FERRITIN 599* 537*      Radiology Studies: I have reviewed all of the imaging during this hospital visit personally     Scheduled Meds:  dextrose       amiodarone  200 mg Oral Daily   bisacodyl  5 mg Oral Daily   dexamethasone (DECADRON) injection  6 mg Intravenous Q24H   dextrose  25 mL Intravenous Once   enoxaparin (LOVENOX) injection  30 mg Subcutaneous Q24H   insulin aspart  0-9 Units Subcutaneous TID WC   insulin glargine  10 Units Subcutaneous Daily   levothyroxine  112 mcg Oral Q0600    morphine injection  2 mg Intravenous Q4H   polyethylene glycol  17 g Oral QHS   senna-docusate  2 tablet Oral BID   sodium chloride flush  3 mL Intravenous Q12H   sodium chloride flush  3 mL Intravenous Q12H   Continuous Infusions:  sodium chloride     azithromycin     cefTRIAXone (ROCEPHIN)  IV 1 g (12/04/2018 1906)   remdesivir 100 mg in NS 250 mL       LOS: 1 day        Cecil Vandyke Gerome Apley, MD

## 2018-12-14 NOTE — TOC Initial Note (Signed)
Transition of Care [facility]) - Initial/Assessment Note    Patient Details  Name: Sharon [name] MRN: [id number] Date of Birth: 04-10-1933  Transition of [facility]) CM/SW Contact:    Ihor Gully, LCSW Phone Number: 01-04-2019, 3:09 PM  Clinical Narrative:                 Bernarda Caffey [place], provided history. Patient is from [place]. She has been a resident at the facility since the end of May/first of June. Patient went to SNF for stage IV wound care. The goal was initially for her to have the wounds healed then transition to ALF level of care. Mr. Roane stated that prior to admitting to the facility, patient ambulated with a walker. He stated that the SNF "let her decline" and she rarely got out of bed while in SNF.  TOC will continue to follow patient and address needs related to discharge plan as appropriate.   Expected Discharge Plan: Skilled Nursing Facility Barriers to Discharge: Continued Medical Work up   Patient Goals and CMS Choice Patient states their goals for this hospitalization and ongoing recovery are:: Son wants patient to get better.      Expected Discharge Plan and Services Expected Discharge Plan: [place] arrangements for the past 2 months: [place]                                      Prior Living Arrangements/Services Living arrangements for the past 2 months: [place] Lives with:: Facility Resident Patient language and need for interpreter reviewed:: Yes Do you feel safe going back to the place where you live?: Yes      Need for Family Participation in Patient Care: Yes (Comment) Care giver support system in place?: Yes (comment) Current home services: DME Criminal Activity/Legal Involvement Pertinent to Current Situation/Hospitalization: No - Comment as needed  Activities of Daily Living      Permission Sought/Granted Permission sought to share information  with : Family Supports          Permission granted to share info w Relationship: Eliane Hammersmith, son, [phone]     Emotional Assessment Appearance:: Appears stated age   Affect (typically observed): Unable to Assess Orientation: : Oriented to Self Alcohol / Substance Use: Not Applicable Psych Involvement: No (comment)  Admission diagnosis:  Dehydration [E86.0] AKI (acute kidney injury) ([place]) [N17.9] Acute respiratory failure due to COVID-19 ([place]) [U07.1, J96.00] Patient Active Problem List   Diagnosis Date Noted  . Acute respiratory disease due to COVID-19 virus 12/04/2018  . Dementia with behavioral disturbance ([place]) 12/13/2018  . Acquired hypothyroidism 12/09/2018  . Hypotension 05/28/2018  . Lymphedema 03/24/2018  . Pressure injury of skin 03/12/2018  . Atherosclerosis of native arteries of the extremities with ulceration ([place]) 05/07/2017  . Non-healing ulcer ([place]) 04/16/2017  . Diabetic neuropathy ([place]) 04/07/2017  . Septic shock ([place]) 03/20/2017  . CKD (chronic kidney disease), stage III 03/19/2017  . Goals of care, counseling/discussion 03/03/2017  . PAD (peripheral artery disease) ([place]) 02/03/2017  . Abdominal aortic stenosis 02/03/2017  . Bilateral lower extremity edema 02/03/2017  . Anemia 01/29/2017  . B12 deficiency 01/29/2017  . Pancreatic mass 01/29/2017  . Hyponatremia 07/07/2015  . Foot ulcer ([place]) 02/07/2015  . Acute kidney injury superimposed on CKD ([place]) 11/05/2014  . Diabetic foot infection ([place]) 10/10/2014  .  Chronic diastolic heart failure ([place]) 05/31/2014  . HTN (hypertension) 05/31/2014  . DM (diabetes mellitus), type 2, uncontrolled ([place]) 05/31/2014   PCP:  Rusty Aus, MD Pharmacy:   [place], [place] - [address] [place] [address] [place] [address] Phone: [phone] Fax: [place], [place] [address] [place]  [address] Phone: [phone] Fax: [place], [place] [address] [place]  [address] Phone: [phone] Fax: [place], [place]. [place]. [place] [address] Phone: [phone] Fax: [phone]     Social Determinants of Health (SDOH) Interventions    Readmission Risk Interventions Readmission Risk Prevention Plan 05/29/2018  Transportation Screening Complete  Medication Review (RN Care Manager) Complete  [place] Not Applicable  Some recent data might be hidden

## 2018-12-14 NOTE — Progress Notes (Signed)
CRITICAL VALUE ALERT  Critical Value:  Calcium 6.4  Date & Time Notied:  2018-12-14  0851  Provider Notified: Arrien  Orders Received/Actions taken: Waiting for orders

## 2018-12-14 NOTE — Progress Notes (Signed)
Was able to facetime patients son Sharon [name] and his family (wife Darrick Grinder and son Glennon Mac), they were very happy to be able to see and communicate with her. She addressed her son by name and told them all that she loved them very much, gave them a brief update on how she is doing medically, they thanked me for the update.

## 2018-12-14 NOTE — Plan of Care (Signed)
  Problem: Respiratory: Goal: Will maintain a patent airway Outcome: Not Progressing   Problem: Respiratory: Goal: Complications related to the disease process, condition or treatment will be avoided or minimized Outcome: Not Progressing   Problem: Skin Integrity: Goal: Risk for impaired skin integrity will decrease Outcome: Progressing

## 2018-12-14 NOTE — Progress Notes (Signed)
Pt began to desat into the 70s, pt found to have non-rebreather mask off. Pt becoming more agitated. Placed mask back on, sats came up to 80%, RT notifed. Pt then placed on high flow [place] at 15L and non-rebreather at 15L, sats has sustained between 84-87%. MD on call paged.   0635:Received verbal orders to obtain stat CXR. MD aware, pt is on non-rebreather and high flow [place], maintain sats 88% or greater.

## 2018-12-14 NOTE — Progress Notes (Addendum)
While assisting primary RN, patient found to have removed NRBR and hiflow [place] and O2 sats were in the 40s, while wearing mittens. Replaced O2 devices. Patient will not follow commands and continues to attempt to remove O2 despite education and re-education. Paged MD for update, primary nurse and charge nurse notified.   09:25 MD at bedside, patient again removing all O2 despite wearing mittens and education. O2 sats in 60s. O2 replaced, sats 88%  Patient is not oriented to place or situation. BL wrist restraints to be ordered. Primary RN notified.   14:49 Responded to tele sitter alarm. Patient continues to remove NRBR even while in restraints. O2 sats in the 40s. Patient screaming "God please let me die." Paged MD for update. Will update primary RN.

## 2018-12-14 NOTE — Progress Notes (Addendum)
Hypoglycemic Event  CBG: 59  Treatment:  D50 69mL  Symptoms: none  Follow-up CBG: Time: 0853 CBG Result:  134  Possible Reasons for Event:  NPO  Comments/MD notified:  Arrien    Terence Lux, Tilda Franco L

## 2018-12-14 NOTE — Progress Notes (Signed)
Son [name] called in for update, appreciative for the care his mom is receiving, asked if I would Facetime his brother [name], I told him that I would do so around 2130 when I was going to hang her plasma, he thanked me. Patient is resting in bed, hollering constantly, bilateral wrist restraints were released and ROM was done, pt was turned and repositioned with pillows but she moves a lot and does not keep the pillows in place, will continue to monitor, call bell at bedside, bed alarm on.

## 2018-12-14 DEATH — deceased

## 2019-01-14 NOTE — Death Summary Note (Signed)
Death Summary  Sharon [name] MVE:[id number] DOB: 1933-06-30 DOA: 2018-12-30  PCP: Rusty Aus, MD  Admit date: 12-30-2018 Date of Death: 01-06-2019 Time of Death: 22:53  Notification: Rusty Aus, MD notified of death of 2019/01/06   History of present illness:  Sharon [name] is a 86 y.o. female with a history of CVA, peripheral vascular disease, hypothyroidism, hypertension, dyslipidemia, type 2 diabetes mellitus, chronic kidney disease, heart failure, atrial fibrillation and dementia  Sharon [name] presented with complaint of dyspnea.  Sharon [name] did not improve after aggressive medical therapy with supplemental oxygen, antiviral therapy, systemic corticosteroids, Actemra and convalescent plasma.   Patient was diagnosed November 16 with SARS COVID-19 viral pneumonia, she had fever, cough and poor oral intake. On Dec 30, 2022 she developed hypoxemia, with oxygen saturation down to the 70s on room air.  When EMS arrived her blood pressure was 88/54 and she was noted to have increased work of breathing.  On her initial physical examination her blood pressure was 132/63, pulse rate 85, respiratory rate 27, oxygen saturation 94% on 6 L/min per nasal cannula and intermittent use of nonrebreather mask.  Patient was combative and agitated, her lungs had diffuse rhonchi, heart S1-S2 present rhythmic, soft abdomen, no lower extremity edema. Sodium 134, potassium 5.2, chloride 105, bicarb 17, glucose 160, BUN 40, creatinine 1.95, AST 98, ALT 55, white count 12.4, hemoglobin 11.9, hematocrit 38.2, platelets 209.  SARS COVID-19 was positive.  Urinalysis had more than 50 white cells, more than 300 protein, specific gravity 1.023.  Her chest radiograph had diffuse, bilateral, reticulonodular patchy infiltrates.  EKG 75 bpm, normal axis, normal intervals, sinus rhythm, no ST segment or T wave changes, poor R wave progression.  Patient was admitted to the hospital with a working diagnosis  of acute hypoxic respiratory failure due to SARS COVID-19 viral pneumonia.   Final Diagnoses:   1. Acute hypoxic respiratory failure due to SARS COVID 19 viral pneumonia.  2. Metabolic encephalopathy in the setting of dementia.  3. AKI on CKD stage 3b with non gap metabolic acidosis and hypomagnesemia/ hypocalcemia. Marland Kitchen  4. Paroxysmal atrial fibrillation with chronic diastolic heart failure.  5. HTN. .  6. T2DM with hypoglycemia. 6. Hypothyroid.  7.  Right heel pressure ulcer unstageable, present on admission, left heel ulcer, unstageable, present admission.   The results of significant diagnostics from this hospitalization (including imaging, microbiology, ancillary and laboratory) are listed below for reference.    Significant Diagnostic Studies: Dg Chest Port 1 View  Result Date: 2018-12-31 CLINICAL DATA:  COVID-19 positive. Shortness of breath. EXAM: PORTABLE CHEST 1 VIEW COMPARISON:  Chest x-rays dated 2018-12-30 and 04/21/2018. FINDINGS: Diffuse bilateral airspace opacities are stable compared to yesterday's exam. No pneumothorax seen. Heart size and mediastinal contours are grossly stable. IMPRESSION: No interval change compared to yesterday's exam. Diffuse bilateral airspace opacities are stable, compatible with multifocal pneumonia. Electronically Signed   By: Franki Cabot M.D.   On: 12-31-2018 07:24   Dg Chest Port 1 View  Result Date: 2018/12/30 CLINICAL DATA:  Shortness of breath EXAM: PORTABLE CHEST 1 VIEW COMPARISON:  04/21/2018 FINDINGS: Multifocal airspace opacities are noted. There is no pneumothorax. No large pleural effusion. The heart size is stable from prior study. There is no acute osseous abnormality. Again noted are findings of in L1 vertebral augmentation procedure. IMPRESSION: Multifocal airspace opacities concerning for multifocal pneumonia (viral or bacterial). Electronically Signed   By: Constance Holster M.D.   On: 2018/12/30 15:11  Microbiology:  Recent Results (from the past 240 hour(s))  Urine culture     Status: Abnormal   Collection Time: 11/17/2018  4:20 PM   Specimen: Urine, Clean Catch  Result Value Ref Range Status   Specimen Description URINE, CLEAN CATCH  Final   Special Requests   Final    NONE Performed at [facility], [address] [place], [place] [address]    Culture MULTIPLE SPECIES PRESENT, SUGGEST RECOLLECTION (A)  Final   Report Status 12/14/2018 FINAL  Final     Labs: Basic Metabolic Panel: Recent Labs  Lab 2018-12-25 0408  NA 139  K 3.9  CL 112*  CO2 18*  GLUCOSE 79  BUN 33*  CREATININE 1.50*  CALCIUM 6.4*  MG 1.8  PHOS 2.6   Liver Function Tests: Recent Labs  Lab 2018/12/25 0408  AST 130*  ALT 71*  ALKPHOS 79  BILITOT 0.2*  PROT 4.7*  ALBUMIN 2.0*   No results for input(s): LIPASE, AMYLASE in the last 168 hours. No results for input(s): AMMONIA in the last 168 hours. CBC: Recent Labs  Lab 2018/12/25 0408  WBC 10.2  NEUTROABS 9.1*  HGB 8.2*  HCT 26.6*  MCV 91.7  PLT 138*   Cardiac Enzymes: No results for input(s): CKTOTAL, CKMB, CKMBINDEX, TROPONINI in the last 168 hours. D-Dimer No results for input(s): DDIMER in the last 72 hours. BNP: Invalid input(s): POCBNP CBG: Recent Labs  Lab 25-Dec-2018 0812 25-Dec-2018 0853 25-Dec-2018 1214 25-Dec-2018 1728 12/25/2018 2121  GLUCAP 59* 134* 118* 177* 242*   Anemia work up No results for input(s): VITAMINB12, FOLATE, FERRITIN, TIBC, IRON, RETICCTPCT in the last 72 hours. Urinalysis    Component Value Date/Time   COLORURINE YELLOW 11/29/2018 1615   APPEARANCEUR CLOUDY (A) 12/03/2018 1615   APPEARANCEUR Hazy 08/18/2013 0253   LABSPEC 1.023 12/09/2018 1615   LABSPEC 1.012 08/18/2013 0253   PHURINE 5.0 11/25/2018 1615   GLUCOSEU 50 (A) 11/16/2018 1615   GLUCOSEU >=500 08/18/2013 0253   HGBUR SMALL (A) 11/28/2018 1615   BILIRUBINUR NEGATIVE 11/28/2018 1615   BILIRUBINUR Negative 08/18/2013 0253   KETONESUR NEGATIVE 11/30/2018  1615   PROTEINUR >=300 (A) 11/17/2018 1615   NITRITE NEGATIVE 11/21/2018 1615   LEUKOCYTESUR LARGE (A) 11/17/2018 1615   LEUKOCYTESUR Trace 08/18/2013 0253   Sepsis Labs Invalid input(s): PROCALCITONIN,  WBC,  LACTICIDVEN     SIGNED:  Tawni Millers, MD  [facility] 12/17/2018, 3:21 PM Pager   If 7PM-7AM, please contact night-coverage [email or web address] Password [email or web address]

## 2020-11-11 IMAGING — CT CT HEAD W/O CM
3 series · 15 of 44 positions shown, 18 images · non-contrast
Comparison: November 08, 2017

CLINICAL DATA: Right-sided weakness/ataxia

EXAM:
CT HEAD WITHOUT CONTRAST
TECHNIQUE: Contiguous axial images were obtained from the base of the skull
through the vertex without intravenous contrast.

[Series 2: head wo · axial · 0.39mm/px · z∈[+74,+184]mm · 9 of 27 slices shown, 12 images]
[im 3/27  brain]
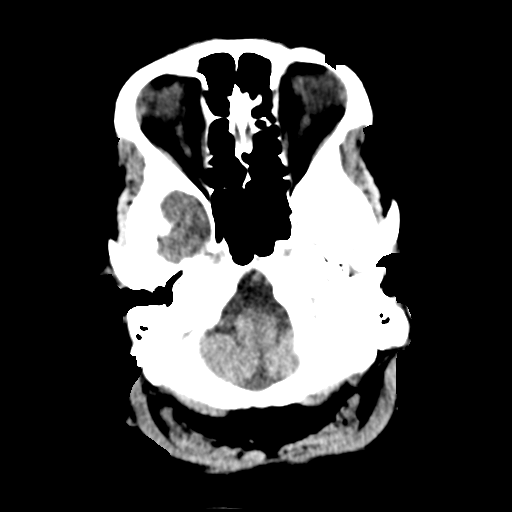
[im 3/27  bone]
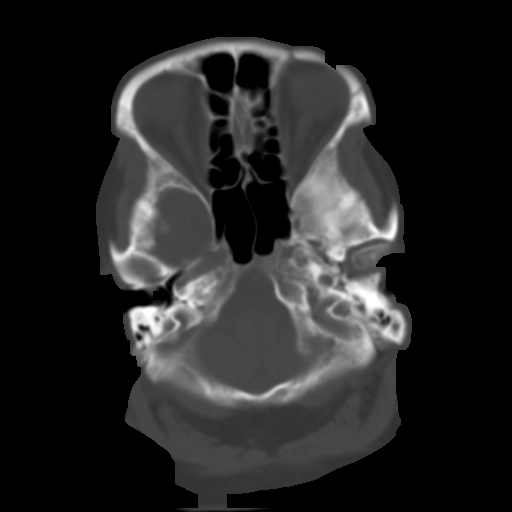
[im 6/27  brain]
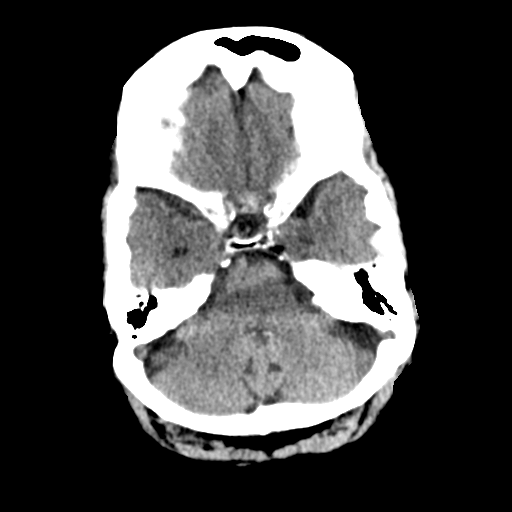
[im 8/27  brain]
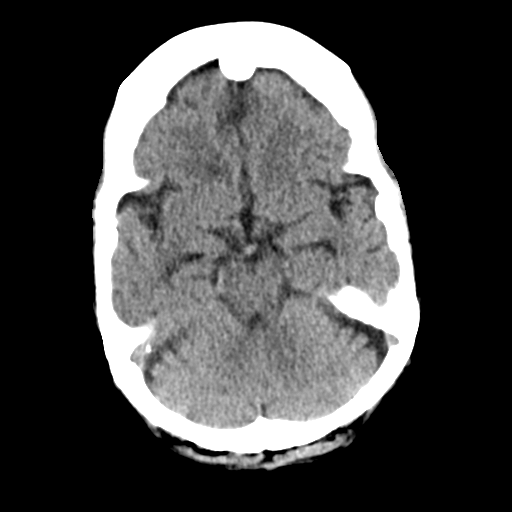
[im 11/27  brain]
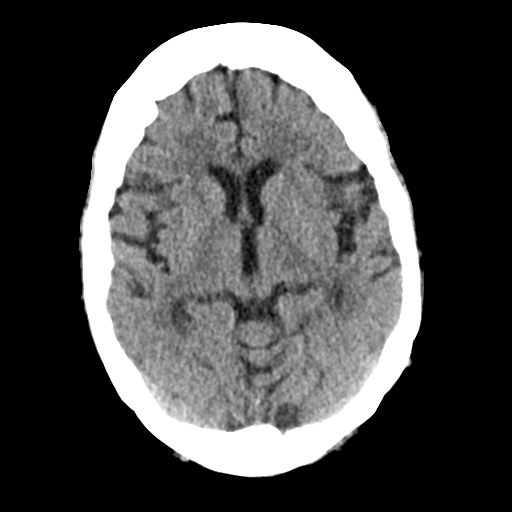
[im 14/27  brain]
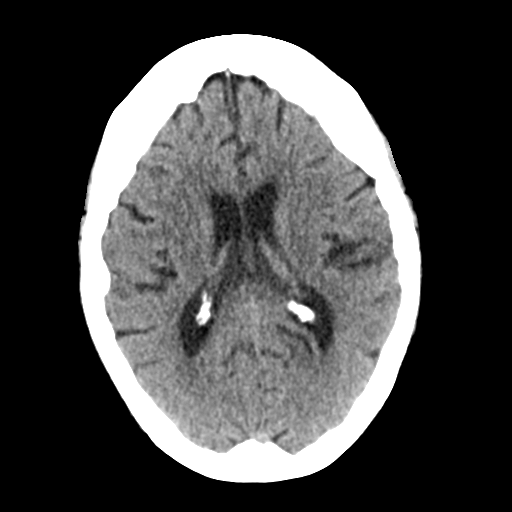
[im 14/27  bone]
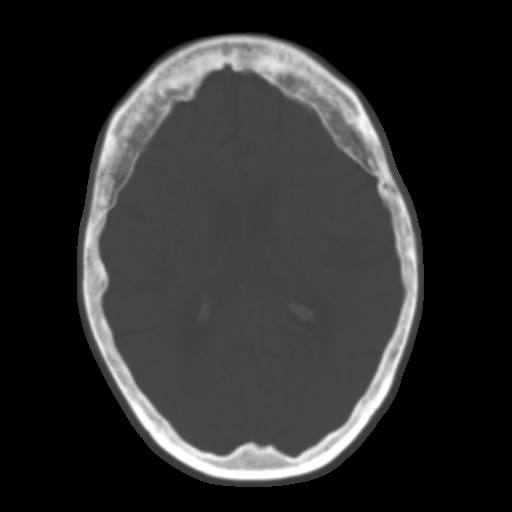
[im 17/27  brain]
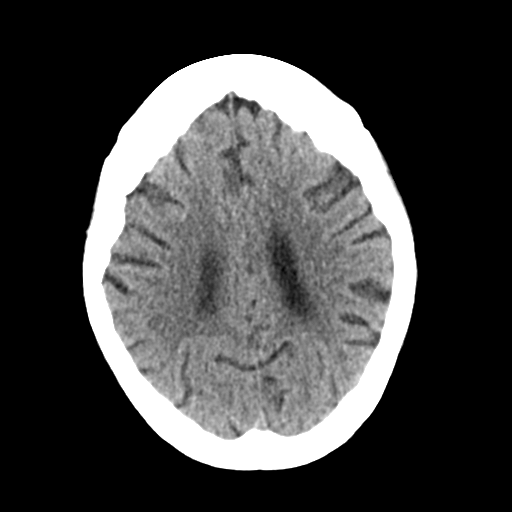
[im 20/27  brain]
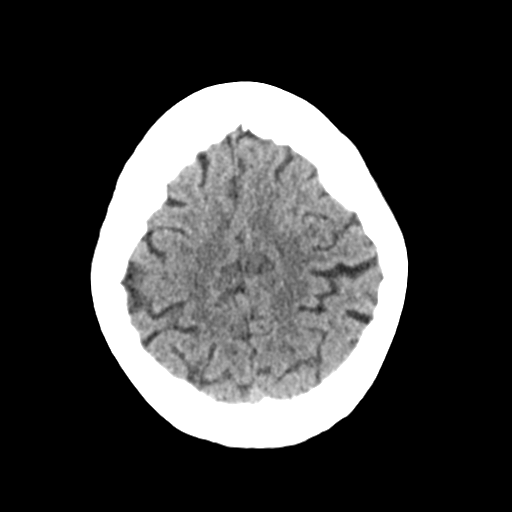
[im 22/27  brain]
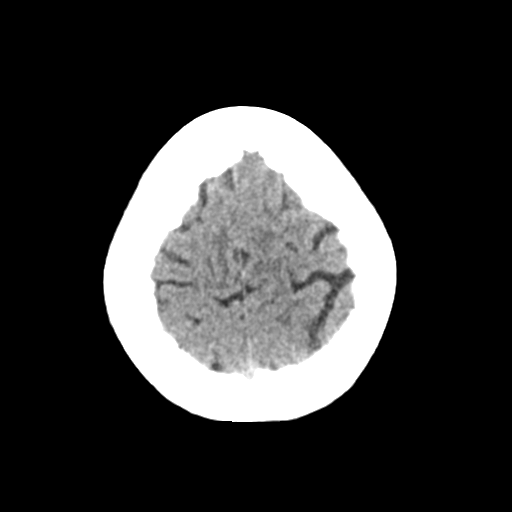
[im 25/27  brain]
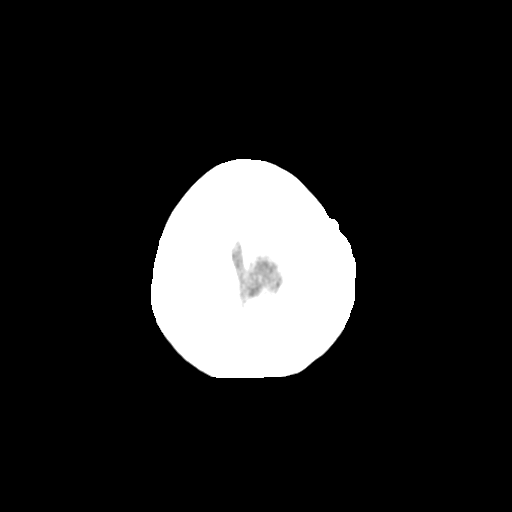
[im 25/27  bone]
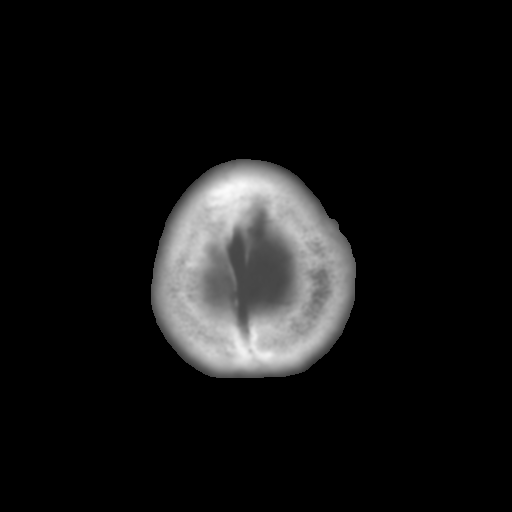

[Series 4: coronal soft tissue · coronal · 0.28mm/px · 3 of 65 slices shown]
[im 22/65  brain]
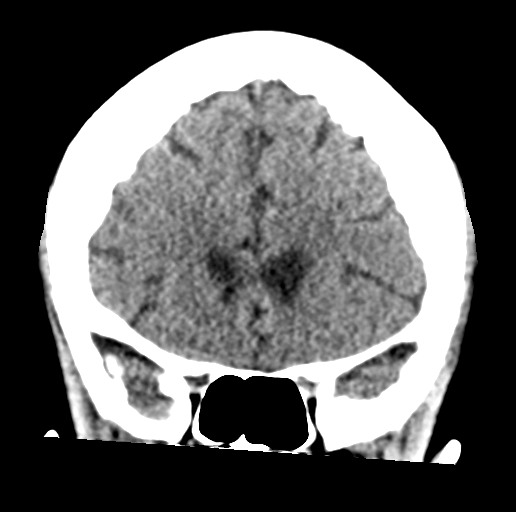
[im 29/65  brain]
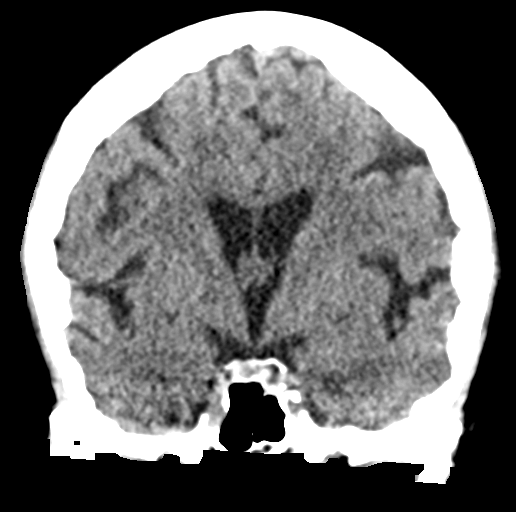
[im 36/65  brain]
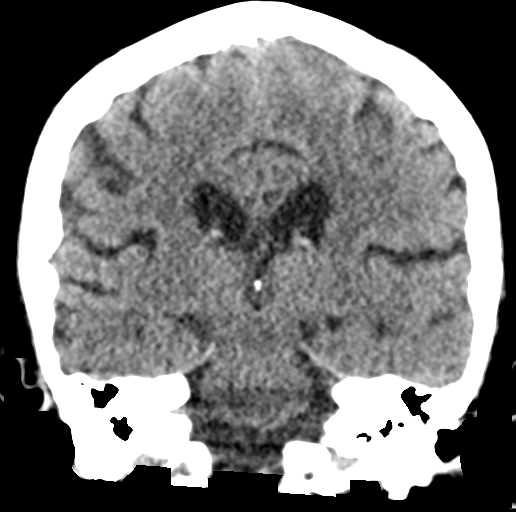

[Series 5: sagittal soft tissue · sagittal · 0.28mm/px · 3 of 48 slices shown]
[im 16/48  brain]
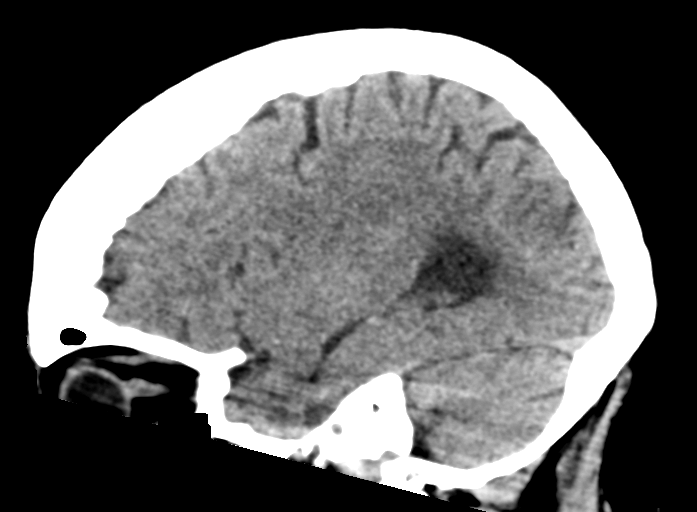
[im 24/48  brain]
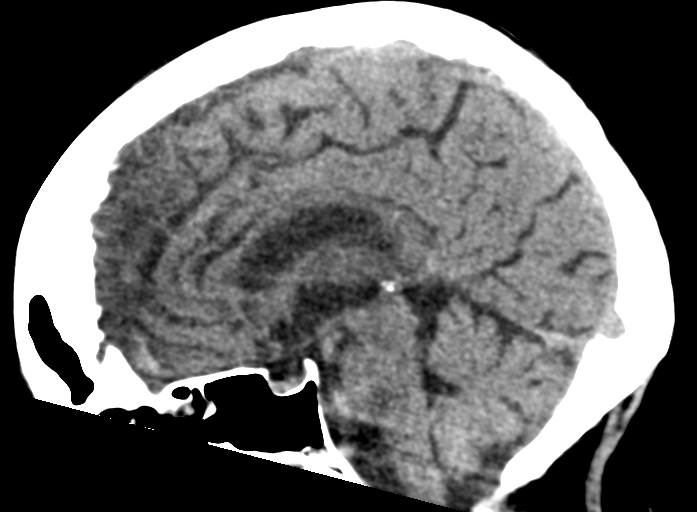
[im 32/48  brain]
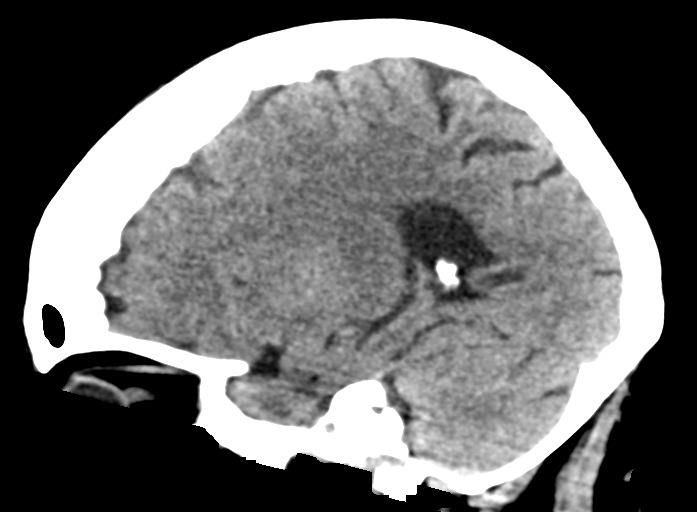

[15 of 44 positions shown; findings below may reference images not displayed]

FINDINGS: Brain: Ventricles and sulci appear within normal limits for age.
There is no evident intracranial mass, hemorrhage, extra-axial fluid
collection, or midline shift. Brain parenchyma appears unremarkable.
No evident acute infarct.

Vascular: No hyperdense vessel. There is calcification in each
carotid siphon region.

Skull: The bony calvarium appears intact.

Sinuses/Orbits: There is mucosal thickening in several ethmoid air
cells. Other visualized paranasal sinuses are clear. Visualized
orbits appear symmetric bilaterally.

Other: Mastoid air cells are clear.
IMPRESSION: Brain parenchyma appears unremarkable. No acute infarct evident. No
mass or hemorrhage.

There are foci of arterial vascular calcification. There is mucosal
thickening in several ethmoid air cells.

## 2020-12-31 IMAGING — DX PORTABLE CHEST - 1 VIEW
1 series · 1 of 1 positions shown · non-contrast
Comparison: 03/08/2018

CLINICAL DATA: Bilateral leg pain.

EXAM:
PORTABLE CHEST 1 VIEW

[chest ap]
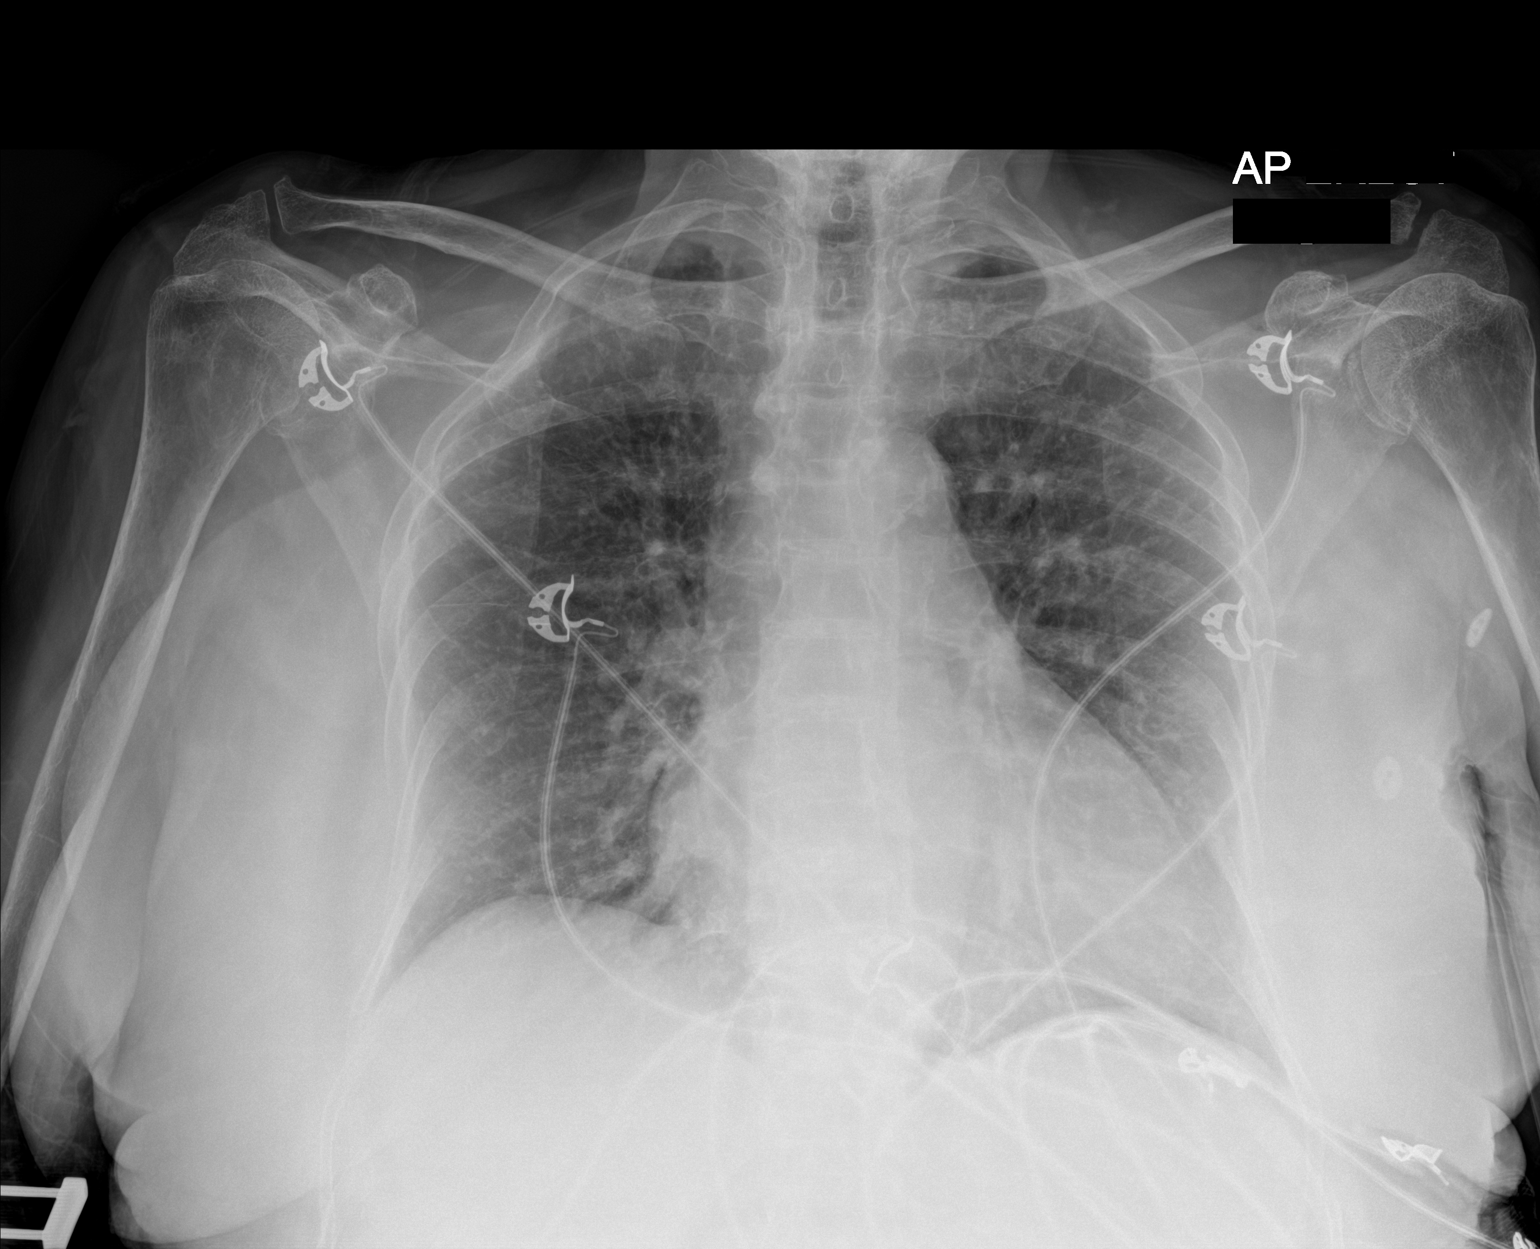

[1 of 1 positions shown; findings below may reference images not displayed]

FINDINGS: Mild cardiac enlargement. No vascular congestion, edema, or
consolidation. Central interstitial pattern with mild bronchiectasis
and bronchial wall thickening likely representing chronic
bronchitis. No blunting of costophrenic angles. No pneumothorax.
Calcification of the aorta. Degenerative changes in the spine and
shoulders.
IMPRESSION: Cardiac enlargement. Chronic bronchitic changes in the lungs. No
evidence of active pulmonary disease.
